# Patient Record
Sex: Female | Born: 1945
Health system: Southern US, Community
[De-identification: ages and names within clinical notes are randomized; demographics above are authoritative.]

## PROBLEM LIST (undated history)

## (undated) DIAGNOSIS — I495 Sick sinus syndrome: Secondary | ICD-10-CM

## (undated) DIAGNOSIS — I4891 Unspecified atrial fibrillation: Secondary | ICD-10-CM

## (undated) DIAGNOSIS — H9190 Unspecified hearing loss, unspecified ear: Secondary | ICD-10-CM

## (undated) DIAGNOSIS — Z950 Presence of cardiac pacemaker: Secondary | ICD-10-CM

## (undated) DIAGNOSIS — A809 Acute poliomyelitis, unspecified: Secondary | ICD-10-CM

## (undated) DIAGNOSIS — I10 Essential (primary) hypertension: Secondary | ICD-10-CM

## (undated) DIAGNOSIS — E119 Type 2 diabetes mellitus without complications: Secondary | ICD-10-CM

## (undated) DIAGNOSIS — R112 Nausea with vomiting, unspecified: Secondary | ICD-10-CM

## (undated) DIAGNOSIS — E0590 Thyrotoxicosis, unspecified without thyrotoxic crisis or storm: Secondary | ICD-10-CM

## (undated) DIAGNOSIS — M1990 Unspecified osteoarthritis, unspecified site: Secondary | ICD-10-CM

## (undated) DIAGNOSIS — G4730 Sleep apnea, unspecified: Secondary | ICD-10-CM

## (undated) DIAGNOSIS — I2510 Atherosclerotic heart disease of native coronary artery without angina pectoris: Secondary | ICD-10-CM

## (undated) DIAGNOSIS — J189 Pneumonia, unspecified organism: Secondary | ICD-10-CM

## (undated) DIAGNOSIS — R0600 Dyspnea, unspecified: Secondary | ICD-10-CM

## (undated) DIAGNOSIS — Z9889 Other specified postprocedural states: Secondary | ICD-10-CM

## (undated) DIAGNOSIS — I480 Paroxysmal atrial fibrillation: Secondary | ICD-10-CM

## (undated) DIAGNOSIS — J984 Other disorders of lung: Secondary | ICD-10-CM

## (undated) DIAGNOSIS — I5033 Acute on chronic diastolic (congestive) heart failure: Secondary | ICD-10-CM

## (undated) DIAGNOSIS — I499 Cardiac arrhythmia, unspecified: Secondary | ICD-10-CM

## (undated) HISTORY — DX: Type 2 diabetes mellitus without complications: E11.9

## (undated) HISTORY — PX: LAPAROSCOPIC HYSTERECTOMY: SHX1926

## (undated) HISTORY — DX: Acute poliomyelitis, unspecified: A80.9

## (undated) HISTORY — DX: Unspecified atrial fibrillation: I48.91

## (undated) HISTORY — DX: Unspecified osteoarthritis, unspecified site: M19.90

## (undated) HISTORY — DX: Other disorders of lung: J98.4

## (undated) HISTORY — DX: Unspecified atrial fibrillation: Z95.0

## (undated) HISTORY — DX: Sick sinus syndrome: I49.5

## (undated) HISTORY — PX: EYE SURGERY: SHX253

## (undated) HISTORY — DX: Paroxysmal atrial fibrillation: I48.0

## (undated) HISTORY — DX: Acute on chronic diastolic (congestive) heart failure: I50.33

## (undated) HISTORY — PX: ATRIAL FIBRILLATION ABLATION: SHX5732

## (undated) HISTORY — DX: Essential (primary) hypertension: I10

## (undated) HISTORY — DX: Atherosclerotic heart disease of native coronary artery without angina pectoris: I25.10

---

## 1979-06-04 HISTORY — PX: TOTAL ABDOMINAL HYSTERECTOMY: SHX209

## 1992-06-03 HISTORY — PX: CHOLECYSTECTOMY: SHX55

## 1994-06-03 HISTORY — PX: OTHER SURGICAL HISTORY: SHX169

## 2000-04-08 ENCOUNTER — Encounter: Payer: Self-pay | Admitting: Internal Medicine

## 2000-04-08 ENCOUNTER — Encounter: Admission: RE | Admit: 2000-04-08 | Discharge: 2000-04-08 | Payer: Self-pay | Admitting: Internal Medicine

## 2001-03-19 ENCOUNTER — Ambulatory Visit (HOSPITAL_COMMUNITY): Admission: RE | Admit: 2001-03-19 | Discharge: 2001-03-19 | Payer: Self-pay | Admitting: *Deleted

## 2001-03-20 ENCOUNTER — Encounter: Payer: Self-pay | Admitting: *Deleted

## 2001-03-20 ENCOUNTER — Ambulatory Visit (HOSPITAL_COMMUNITY): Admission: RE | Admit: 2001-03-20 | Discharge: 2001-03-20 | Payer: Self-pay | Admitting: *Deleted

## 2004-04-24 ENCOUNTER — Other Ambulatory Visit: Admission: RE | Admit: 2004-04-24 | Discharge: 2004-04-24 | Payer: Self-pay | Admitting: Gynecology

## 2006-03-05 ENCOUNTER — Encounter: Admission: RE | Admit: 2006-03-05 | Discharge: 2006-03-05 | Payer: Self-pay | Admitting: Internal Medicine

## 2006-07-24 ENCOUNTER — Emergency Department (HOSPITAL_COMMUNITY): Admission: EM | Admit: 2006-07-24 | Discharge: 2006-07-25 | Payer: Self-pay | Admitting: Emergency Medicine

## 2006-07-25 ENCOUNTER — Ambulatory Visit (HOSPITAL_COMMUNITY): Admission: RE | Admit: 2006-07-25 | Discharge: 2006-07-25 | Payer: Self-pay | Admitting: *Deleted

## 2006-07-25 ENCOUNTER — Encounter (INDEPENDENT_AMBULATORY_CARE_PROVIDER_SITE_OTHER): Payer: Self-pay | Admitting: Specialist

## 2007-09-30 ENCOUNTER — Encounter: Admission: RE | Admit: 2007-09-30 | Discharge: 2007-09-30 | Payer: Self-pay | Admitting: Internal Medicine

## 2008-02-18 ENCOUNTER — Encounter: Admission: RE | Admit: 2008-02-18 | Discharge: 2008-02-18 | Payer: Self-pay | Admitting: Internal Medicine

## 2008-06-15 ENCOUNTER — Ambulatory Visit (HOSPITAL_COMMUNITY): Admission: RE | Admit: 2008-06-15 | Discharge: 2008-06-15 | Payer: Self-pay | Admitting: Endocrinology

## 2008-06-15 ENCOUNTER — Encounter (HOSPITAL_COMMUNITY): Admission: RE | Admit: 2008-06-15 | Discharge: 2008-06-16 | Payer: Self-pay | Admitting: Endocrinology

## 2008-06-20 ENCOUNTER — Encounter: Admission: RE | Admit: 2008-06-20 | Discharge: 2008-06-20 | Payer: Self-pay | Admitting: Internal Medicine

## 2009-03-21 ENCOUNTER — Ambulatory Visit (HOSPITAL_COMMUNITY): Admission: RE | Admit: 2009-03-21 | Discharge: 2009-03-21 | Payer: Self-pay | Admitting: Gynecology

## 2009-03-22 ENCOUNTER — Encounter: Admission: RE | Admit: 2009-03-22 | Discharge: 2009-03-22 | Payer: Self-pay | Admitting: Internal Medicine

## 2009-08-03 ENCOUNTER — Encounter: Admission: RE | Admit: 2009-08-03 | Discharge: 2009-08-03 | Payer: Self-pay | Admitting: Internal Medicine

## 2010-10-19 NOTE — Procedures (Signed)
[facility]  Patient:    Hailey [name], COTO Visit Number: [id number] MRN: [id number]          Service Type: END Location: ENDO Attending Physician:  Sabino Gasser Dictated by:   Sabino Gasser, M.D. Proc. Date: 03/19/01 Admit Date:  03/19/2001                             Procedure Report  PROCEDURE:  Colonoscopy.  INDICATIONS:  Colon cancer screening.  ANESTHESIA:  Fentanyl 112.5 mg, Versed 12 mg.  DESCRIPTION OF PROCEDURE:  With the patient mildly sedated in the left lateral decubitus position, the Olympus videoscopic colonoscope was inserted into the rectum and passed under direct vision to the ascending colon at which point we could not pass it any further despite abdominal pressure and changing the patients position.  Prep was slightly suboptimal in that she had only taken part of her prep but visualization was adequate.  No gross lesions were seen as we withdrew the colonoscope taking circumferential views through remaining colonic mucosa stopping in the rectum which appeared normal on direct view and showed some small hemorrhoids on retroflexed view.  The rectoscope was straightened and withdrawn.  The patients vital signs and pulse oximeter remained stable.  The patient tolerated the procedure well without complications.  FINDINGS:  Grossly normal examination.  PLAN:  Barium enema to complete examination. Dictated by:   Sabino Gasser, M.D. Attending Physician:  Sabino Gasser DD:  03/19/01 TD:  03/19/01 Job: [id number]

## 2010-10-19 NOTE — Op Note (Signed)
[name], Hailey [name]                ACCOUNT NO.:  [id number]   MEDICAL RECORD NO.:  [id number]          PATIENT TYPE:  AMB   LOCATION:  ENDO                         FACILITY:  [facility]   PHYSICIAN:  Georgiana Spinner, M.D.    DATE OF BIRTH:  September 25, 1945   DATE OF PROCEDURE:  07/25/2006  DATE OF DISCHARGE:                               OPERATIVE REPORT   PROCEDURE:  Colonoscopy with biopsy and polypectomy.   ENDOSCOPIST:  Georgiana Spinner, M.D.   INDICATIONS:  Colon polyps.   The patient had an abnormal virtual colonoscopy which showed a polyp at  the hepatic flexure and one at approximately 19-20 cm from anal verge;  possibly other smaller ones were seen, but not certain.   ANESTHESIA:  Fentanyl 125 mcg, Versed 12 mg.   PROCEDURE:  With the patient mildly sedated in the left lateral  decubitus position, the Pentax videoscopic colonoscope was inserted into  the rectum and passed under direct vision to the cecum, identified by  ileocecal valve and base of cecum, both of which were photographed.  From this point, the colonoscope was slowly withdrawn, taking  circumferential views of the colonic mucosa, stopping in the hepatic  flexure area, where the polyp that was previously described was noted.  It appeared to me that this was a lipoma rather than adenomatous tissue  and this was photographed and biopsy of the polypoid head was taken to  verify this observation.  From this point, the colonoscope was then  further withdrawn, taking circumferential views of the remaining colonic  mucosa, stopping then at approximately 19-20 cm from anal verge, where  the 2nd polyp was seen; this was photographed and appeared it may very  well be adenomatous and this was removed using snare cautery technique.  However, it appeared that we did not get an adequate cautery for this  and the polyp was removed and suctioned into the endoscope for retrieval  through a trap.  I then used a hot biopsy forceps to  cauterize this base  with good cautery effect.  The endoscope was then withdrawn all the way  to the rectum, which appeared normal on direct and showed hemorrhoids on  retroflexed view.  The endoscope was straightened and withdrawn.  The  patient's vital signs and pulse oximetry remained stable.  The patient  tolerated the procedure well without apparent complications.   FINDINGS:  1. Lipoma at hepatic flexure, photographed and biopsied.  2. Polyp at 20 cm from anal verge, removed.  3. Internal hemorrhoids were also noted.   PLAN:  Await biopsy report.  The patient will call me for results and  follow up with me as an outpatient.           ______________________________  Georgiana Spinner, M.D.    GMO/MEDQ  D:  07/25/2006  T:  07/25/2006  Job:  [id number]

## 2010-10-19 NOTE — Op Note (Signed)
NAMEMARCHEL, FOOTE NO.:  [id number]   MEDICAL RECORD NO.:  [id number]          PATIENT TYPE:  EMS   LOCATION:  [facility]                         FACILITY:  [facility]   PHYSICIAN:  Georgiana Spinner, M.D.    DATE OF BIRTH:  06/28/1945   DATE OF PROCEDURE:  DATE OF DISCHARGE:  07/25/2006                               OPERATIVE REPORT   No dictation on this patient.           ______________________________  Georgiana Spinner, M.D.     GMO/MEDQ  D:  07/25/2006  T:  07/26/2006  Job:  [id number]

## 2010-11-15 ENCOUNTER — Other Ambulatory Visit: Payer: Self-pay | Admitting: Gynecology

## 2010-11-23 ENCOUNTER — Emergency Department (HOSPITAL_COMMUNITY): Payer: Medicare Other

## 2010-11-23 ENCOUNTER — Inpatient Hospital Stay (HOSPITAL_COMMUNITY)
Admission: EM | Admit: 2010-11-23 | Discharge: 2010-12-04 | DRG: 981 | Disposition: A | Discharge status: Home Health | Payer: Medicare Other | Attending: Internal Medicine | Admitting: Internal Medicine

## 2010-11-23 DIAGNOSIS — I4891 Unspecified atrial fibrillation: Secondary | ICD-10-CM | POA: Diagnosis present

## 2010-11-23 DIAGNOSIS — R918 Other nonspecific abnormal finding of lung field: Secondary | ICD-10-CM | POA: Diagnosis present

## 2010-11-23 DIAGNOSIS — B370 Candidal stomatitis: Secondary | ICD-10-CM | POA: Diagnosis not present

## 2010-11-23 DIAGNOSIS — E7800 Pure hypercholesterolemia, unspecified: Secondary | ICD-10-CM | POA: Diagnosis present

## 2010-11-23 DIAGNOSIS — F411 Generalized anxiety disorder: Secondary | ICD-10-CM | POA: Diagnosis not present

## 2010-11-23 DIAGNOSIS — I495 Sick sinus syndrome: Secondary | ICD-10-CM | POA: Diagnosis present

## 2010-11-23 DIAGNOSIS — Z7982 Long term (current) use of aspirin: Secondary | ICD-10-CM

## 2010-11-23 DIAGNOSIS — E0500 Thyrotoxicosis with diffuse goiter without thyrotoxic crisis or storm: Secondary | ICD-10-CM | POA: Diagnosis present

## 2010-11-23 DIAGNOSIS — R339 Retention of urine, unspecified: Secondary | ICD-10-CM | POA: Diagnosis not present

## 2010-11-23 DIAGNOSIS — J189 Pneumonia, unspecified organism: Principal | ICD-10-CM | POA: Diagnosis present

## 2010-11-23 DIAGNOSIS — J9600 Acute respiratory failure, unspecified whether with hypoxia or hypercapnia: Secondary | ICD-10-CM | POA: Diagnosis not present

## 2010-11-23 DIAGNOSIS — R7309 Other abnormal glucose: Secondary | ICD-10-CM | POA: Diagnosis not present

## 2010-11-23 DIAGNOSIS — E860 Dehydration: Secondary | ICD-10-CM | POA: Diagnosis present

## 2010-11-23 DIAGNOSIS — E876 Hypokalemia: Secondary | ICD-10-CM | POA: Diagnosis present

## 2010-11-23 DIAGNOSIS — G4733 Obstructive sleep apnea (adult) (pediatric): Secondary | ICD-10-CM | POA: Diagnosis present

## 2010-11-23 DIAGNOSIS — R209 Unspecified disturbances of skin sensation: Secondary | ICD-10-CM | POA: Diagnosis present

## 2010-11-23 DIAGNOSIS — I2510 Atherosclerotic heart disease of native coronary artery without angina pectoris: Secondary | ICD-10-CM | POA: Diagnosis present

## 2010-11-23 DIAGNOSIS — I10 Essential (primary) hypertension: Secondary | ICD-10-CM | POA: Diagnosis present

## 2010-11-23 LAB — DIFFERENTIAL
Basophils Absolute: 0 K/uL (ref 0.0–0.1)
Basophils Relative: 0 % (ref 0–1)
Eosinophils Absolute: 0 10*3/uL (ref 0.0–0.7)
Eosinophils Relative: 0 % (ref 0–5)
Lymphocytes Relative: 11 % — ABNORMAL LOW (ref 12–46)
Lymphs Abs: 1 K/uL (ref 0.7–4.0)
Monocytes Absolute: 0.5 K/uL (ref 0.1–1.0)
Monocytes Relative: 5 % (ref 3–12)
Neutro Abs: 7.6 K/uL (ref 1.7–7.7)
Neutrophils Relative %: 83 % — ABNORMAL HIGH (ref 43–77)

## 2010-11-23 LAB — PROTIME-INR
INR: 2.13 — ABNORMAL HIGH (ref 0.00–1.49)
Prothrombin Time: 24.2 seconds — ABNORMAL HIGH (ref 11.6–15.2)

## 2010-11-23 LAB — COMPREHENSIVE METABOLIC PANEL
ALT: 19 U/L (ref 0–35)
AST: 17 U/L (ref 0–37)
Calcium: 8.8 mg/dL (ref 8.4–10.5)
GFR calc Af Amer: >60 mL/min (ref >60)
Glucose, Bld: 133 mg/dL — ABNORMAL HIGH (ref 70–99)
Sodium: 134 mEq/L — ABNORMAL LOW (ref 135–145)
Total Protein: 7.4 g/dL (ref 6.0–8.3)

## 2010-11-23 LAB — COMPREHENSIVE METABOLIC PANEL WITH GFR
Albumin: 3.5 g/dL (ref 3.5–5.2)
Alkaline Phosphatase: 69 U/L (ref 39–117)
BUN: 8 mg/dL (ref 6–23)
CO2: 29 meq/L (ref 19–32)
Chloride: 97 meq/L (ref 96–112)
Creatinine, Ser: 0.53 mg/dL (ref 0.50–1.10)
GFR calc non Af Amer: >60 mL/min (ref >60)
Potassium: 2.9 meq/L — ABNORMAL LOW (ref 3.5–5.1)
Total Bilirubin: 1.7 mg/dL — ABNORMAL HIGH (ref 0.3–1.2)

## 2010-11-23 LAB — APTT: aPTT: 54 seconds — ABNORMAL HIGH (ref 24–37)

## 2010-11-23 LAB — CBC
HCT: 40.6 % (ref 36.0–46.0)
Hemoglobin: 13.9 g/dL (ref 12.0–15.0)
MCH: 29.9 pg (ref 26.0–34.0)
MCHC: 34.2 g/dL (ref 30.0–36.0)
MCV: 87.3 fL (ref 78.0–100.0)
Platelets: 210 10*3/uL (ref 150–400)
RBC: 4.65 MIL/uL (ref 3.87–5.11)
RDW: 13 % (ref 11.5–15.5)
WBC: 9.1 10*3/uL (ref 4.0–10.5)

## 2010-11-23 LAB — TROPONIN I: Troponin I: <0.3 ng/mL (ref <0.30)

## 2010-11-23 LAB — LIPASE, BLOOD: Lipase: 15 U/L (ref 11–59)

## 2010-11-23 LAB — CK TOTAL AND CKMB (NOT AT ARMC)
CK, MB: 1.4 ng/mL (ref 0.3–4.0)
Relative Index: INVALID (ref 0.0–2.5)
Total CK: 81 U/L (ref 7–177)

## 2010-11-24 ENCOUNTER — Emergency Department (HOSPITAL_COMMUNITY): Payer: Medicare Other

## 2010-11-24 DIAGNOSIS — R911 Solitary pulmonary nodule: Secondary | ICD-10-CM

## 2010-11-24 DIAGNOSIS — J189 Pneumonia, unspecified organism: Secondary | ICD-10-CM

## 2010-11-24 LAB — COMPREHENSIVE METABOLIC PANEL
Alkaline Phosphatase: 70 U/L (ref 39–117)
BUN: 9 mg/dL (ref 6–23)
CO2: 28 mEq/L (ref 19–32)
GFR calc Af Amer: >60 mL/min (ref >60)
GFR calc non Af Amer: >60 mL/min (ref >60)
Glucose, Bld: 172 mg/dL — ABNORMAL HIGH (ref 70–99)
Potassium: 3.8 mEq/L (ref 3.5–5.1)
Total Bilirubin: 0.9 mg/dL (ref 0.3–1.2)
Total Protein: 6.2 g/dL (ref 6.0–8.3)

## 2010-11-24 LAB — PROTIME-INR: Prothrombin Time: 24.4 seconds — ABNORMAL HIGH (ref 11.6–15.2)

## 2010-11-24 LAB — LIPID PANEL: LDL Cholesterol: 31 mg/dL (ref 0–99)

## 2010-11-24 LAB — CARDIAC PANEL(CRET KIN+CKTOT+MB+TROPI)
Relative Index: INVALID (ref 0.0–2.5)
Relative Index: 2.5 (ref 0.0–2.5)
Total CK: 82 U/L (ref 7–177)
Total CK: 111 U/L (ref 7–177)
Troponin I: <0.3 ng/mL (ref <0.30)

## 2010-11-24 LAB — HEMOGLOBIN A1C: Hgb A1c MFr Bld: 6.4 % — ABNORMAL HIGH (ref <5.7)

## 2010-11-24 LAB — PRO B NATRIURETIC PEPTIDE: Pro B Natriuretic peptide (BNP): 939 pg/mL — ABNORMAL HIGH (ref 0–125)

## 2010-11-24 LAB — MAGNESIUM: Magnesium: 2 mg/dL (ref 1.5–2.5)

## 2010-11-24 MED ORDER — IOHEXOL 300 MG/ML  SOLN: 65.0000 mL | Freq: Once | INTRAMUSCULAR | Status: AC | PRN

## 2010-11-25 ENCOUNTER — Inpatient Hospital Stay (HOSPITAL_COMMUNITY): Payer: Medicare Other

## 2010-11-25 LAB — BLOOD GAS, ARTERIAL
Drawn by: 23588
FIO2: 0.21 %
O2 Saturation: 94.9 %
Patient temperature: 98.6
pO2, Arterial: 67.4 mmHg — ABNORMAL LOW (ref 80.0–100.0)

## 2010-11-25 LAB — CBC
MCH: 29.7 pg (ref 26.0–34.0)
Platelets: 179 10*3/uL (ref 150–400)
RBC: 3.91 MIL/uL (ref 3.87–5.11)
RDW: 13.5 % (ref 11.5–15.5)
WBC: 8.4 10*3/uL (ref 4.0–10.5)

## 2010-11-25 LAB — BASIC METABOLIC PANEL
CO2: 24 mEq/L (ref 19–32)
Calcium: 7.9 mg/dL — ABNORMAL LOW (ref 8.4–10.5)
Sodium: 140 mEq/L (ref 135–145)

## 2010-11-25 NOTE — H&P (Signed)
NAMEMERCIA, DOWE NO.:  [id number]  MEDICAL RECORD NO.:  [id number]  LOCATION:  [facility]                         FACILITY:  [facility]  PHYSICIAN:  Michiel Cowboy, MDDATE OF BIRTH:  10-Jul-1945  DATE OF ADMISSION:  11/23/2010 DATE OF DISCHARGE:                             HISTORY & PHYSICAL   PRIMARY CARE PROVIDER:  Massie Maroon, MD  CHIEF COMPLAINT:  Chills and chest pain.  HISTORY:  The patient is a 65 year old female with past medical history significant for goiter, atrial fibrillation and hypertension.  For the past 2-3 days, she had been having worsening chills and cough, not really productive but today around 1 o'clock, she developed pleuritic chest pain, it is worse with coughing and worse with deep inspiration. She has not had any leg swelling, but she did take a trip to the beach this past few days.  She has had some shortness of breath associated with this.  No weight loss.  Her temperature at home was high as 100.9. She does get occasional diarrhea,but has not had any nausea, vomiting, or diarrhea recently.  Otherwise, review of systems negative, 10 systems reviewed.  The patient has no tuberculosis risk factors that she is aware of.  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  She denied any hemoptysis, weight loss, or persistent cough.  Her symptoms are very sudden in onset.  PAST MEDICAL HISTORY:  Significant for: 1. Atrial fibrillation, chronic Coumadin. 2. Hypertension. 3. Hypercholesterolemia. 4. Hyperactive thyroid due to goiter. 5. The patient does have a history of pulmonary nodules which were     followed by Dr. Selena Batten.  She never seen a pulmonologist that I can see     a record of and Dr. Selena Batten was feeling that her pulmonary nodules have     been stable for 2 past years. Today CT scan     showed there was some increase in size of her pulmonary nodules.  SOCIAL HISTORY:  The patient lives with spouse.  Does not smoke or drink or abuse  drugs, never did.  FAMILY HISTORY:  Noncontributory.  ALLERGIES:  To CODEINE, DEMEROL, and TORADOL.  MEDICATIONS: 1. Caltrate 600 plus vitamin D. 2. Coumadin, she takes 5 mg on Sunday, Monday, Wednesday and Friday     and 2.5 mg on the other days. 3. Diovan 80 mg daily. 4. Hydrochlorothiazide 12.5 mg daily. 5. She occasionally takes methocarbamol, but very rarely. 6. Metoprolol 25 mg b.i.d. 7. Pantoprazole 40 mg daily. 8. Propylthiouracil 50 mg b.i.d. 9. Protonix 40 mg daily. 10.Vitamin D3 1000 units daily. 11.WelChol 625 mg 8 tablets daily.  PHYSICAL EXAMINATION:  VITAL SIGNS:  Temperature 98.5, blood pressure 144/65, now down to 125/74, pulse fluctuating between high 90s to 120s, respirations 18, satting 98% on 2 L and was 90-93% on room air. GENERAL:  The patient appears to be in no acute distress. HEAD:  Nontraumatic.  Slightly dry mucous membranes. LUNGS:  There is occasional crackles at the bases bilaterally. HEART:  Irregularly irregular.  No murmurs appreciated. ABDOMEN:  Soft, nontender and nondistended. LOWER EXTREMITIES:  Without clubbing, cyanosis or edema. NEUROLOGICAL:  Grossly intact.  LABORATORY DATA:  White  blood cell count 9.1, hemoglobin 13.9.  Sodium was 134, potassium 2.9, creatinine 0.53.  Total bili 1.7 albumin 3.5. EKG showing no significant changes.  She had old nonspecific ST changes. Chest x-ray showing some infiltrates.  CT scan showed bilateral questionable pneumonia.  There is increase in pulmonary nodules, question reactivation of granulomatous disease.  ASSESSMENT AND PLAN: 1. Bilateral pneumonia.  We will admit, put on Rocephin and     azithromycin for now.  Give IV fluids, Mucinex and oxygen. 2. Pulmonary nodules increasing.  Pulmonary is aware and will consult. 3. History of hypertension.  Her blood pressure actually had been a     little bit low couple times while she was here in the emergency     department.  We will hold all meds  except for metoprolol for now.     Her blood pressure meds could be restarted tomorrow. 4. Hypokalemia.  Replace and check magnesium level in the morning. 5. Atrial fibrillation with rapid ventricular response.  The patient     appears to be somewhat dehydrated with IV fluids, so atrial     fibrillation has improved.  Her heart rate when she is resting     comes down to 90s.  We will continue right now on her home dose     metoprolol and watch her heart rate if needed and give additional     IV fluids versus she may need to be on a low-dose drip. 6. Prophylaxis.  Protonix and Coumadin. 7. History of hyperthyroidism.  We will check TSH.     Michiel Cowboy, MD     AVD/MEDQ  D:  11/24/2010  T:  11/24/2010  Job:  [id number]  cc:   Massie Maroon, MD  Electronically Signed by Therisa Doyne MD on 11/25/2010 09:42:03 PM

## 2010-11-26 LAB — PROTIME-INR: Prothrombin Time: 25.3 seconds — ABNORMAL HIGH (ref 11.6–15.2)

## 2010-11-26 LAB — BASIC METABOLIC PANEL
BUN: 5 mg/dL — ABNORMAL LOW (ref 6–23)
Creatinine, Ser: 0.52 mg/dL (ref 0.50–1.10)
GFR calc Af Amer: >60 mL/min (ref >60)
GFR calc non Af Amer: >60 mL/min (ref >60)
Glucose, Bld: 142 mg/dL — ABNORMAL HIGH (ref 70–99)

## 2010-11-26 LAB — CBC
MCH: 29.4 pg (ref 26.0–34.0)
MCHC: 33.3 g/dL (ref 30.0–36.0)
MCV: 88.3 fL (ref 78.0–100.0)
Platelets: 224 10*3/uL (ref 150–400)
RBC: 4.28 MIL/uL (ref 3.87–5.11)
RDW: 13 % (ref 11.5–15.5)

## 2010-11-27 LAB — PROTIME-INR
INR: 2.84 — ABNORMAL HIGH (ref 0.00–1.49)
Prothrombin Time: 30.3 seconds — ABNORMAL HIGH (ref 11.6–15.2)

## 2010-11-27 LAB — CBC
HCT: 33.9 % — ABNORMAL LOW (ref 36.0–46.0)
Hemoglobin: 11.3 g/dL — ABNORMAL LOW (ref 12.0–15.0)
MCH: 29.3 pg (ref 26.0–34.0)
MCHC: 33.3 g/dL (ref 30.0–36.0)
MCV: 87.8 fL (ref 78.0–100.0)
Platelets: 222 10*3/uL (ref 150–400)
RBC: 3.86 MIL/uL — ABNORMAL LOW (ref 3.87–5.11)
RDW: 13 % (ref 11.5–15.5)
WBC: 6.2 10*3/uL (ref 4.0–10.5)

## 2010-11-27 LAB — COMPREHENSIVE METABOLIC PANEL
Albumin: 2.7 g/dL — ABNORMAL LOW (ref 3.5–5.2)
Alkaline Phosphatase: 58 U/L (ref 39–117)
BUN: 5 mg/dL — ABNORMAL LOW (ref 6–23)
Chloride: 104 mEq/L (ref 96–112)
Creatinine, Ser: 0.47 mg/dL — ABNORMAL LOW (ref 0.50–1.10)
GFR calc Af Amer: >60 mL/min (ref >60)
Glucose, Bld: 135 mg/dL — ABNORMAL HIGH (ref 70–99)
Potassium: 3.1 mEq/L — ABNORMAL LOW (ref 3.5–5.1)
Total Bilirubin: 0.9 mg/dL (ref 0.3–1.2)
Total Protein: 5.9 g/dL — ABNORMAL LOW (ref 6.0–8.3)

## 2010-11-27 LAB — LEGIONELLA ANTIGEN, URINE: Legionella Antigen, Urine: NEGATIVE

## 2010-11-28 LAB — TSH: TSH: 0.764 u[IU]/mL (ref 0.350–4.500)

## 2010-11-28 LAB — BASIC METABOLIC PANEL
BUN: 4 mg/dL — ABNORMAL LOW (ref 6–23)
Calcium: 8.3 mg/dL — ABNORMAL LOW (ref 8.4–10.5)
Creatinine, Ser: <0.47 mg/dL — ABNORMAL LOW (ref 0.50–1.10)
Glucose, Bld: 116 mg/dL — ABNORMAL HIGH (ref 70–99)

## 2010-11-28 LAB — CBC
HCT: 34.4 % — ABNORMAL LOW (ref 36.0–46.0)
Hemoglobin: 11.4 g/dL — ABNORMAL LOW (ref 12.0–15.0)
MCH: 29.2 pg (ref 26.0–34.0)
MCHC: 33.1 g/dL (ref 30.0–36.0)
MCV: 88.2 fL (ref 78.0–100.0)
RDW: 13.1 % (ref 11.5–15.5)

## 2010-11-28 LAB — PROTIME-INR: INR: 3.15 — ABNORMAL HIGH (ref 0.00–1.49)

## 2010-11-29 ENCOUNTER — Inpatient Hospital Stay (HOSPITAL_COMMUNITY): Payer: Medicare Other

## 2010-11-29 LAB — PROTIME-INR: INR: 3.1 — ABNORMAL HIGH (ref 0.00–1.49)

## 2010-11-29 LAB — BASIC METABOLIC PANEL
CO2: 29 mEq/L (ref 19–32)
Calcium: 7.3 mg/dL — ABNORMAL LOW (ref 8.4–10.5)
Glucose, Bld: 111 mg/dL — ABNORMAL HIGH (ref 70–99)
Sodium: 140 mEq/L (ref 135–145)

## 2010-11-29 LAB — CBC
HCT: 32.4 % — ABNORMAL LOW (ref 36.0–46.0)
Hemoglobin: 10.7 g/dL — ABNORMAL LOW (ref 12.0–15.0)
MCH: 29.2 pg (ref 26.0–34.0)
RBC: 3.67 MIL/uL — ABNORMAL LOW (ref 3.87–5.11)

## 2010-11-30 LAB — CBC
HCT: 35.8 % — ABNORMAL LOW (ref 36.0–46.0)
Hemoglobin: 12 g/dL (ref 12.0–15.0)
MCHC: 33.5 g/dL (ref 30.0–36.0)
WBC: 6.2 10*3/uL (ref 4.0–10.5)

## 2010-11-30 LAB — PROTIME-INR: INR: 2.67 — ABNORMAL HIGH (ref 0.00–1.49)

## 2010-12-01 LAB — CBC
HCT: 34.4 % — ABNORMAL LOW (ref 36.0–46.0)
Hemoglobin: 11.3 g/dL — ABNORMAL LOW (ref 12.0–15.0)
WBC: 6 10*3/uL (ref 4.0–10.5)

## 2010-12-01 LAB — HEPARIN LEVEL (UNFRACTIONATED): Heparin Unfractionated: <0.1 IU/mL — ABNORMAL LOW (ref 0.30–0.70)

## 2010-12-01 LAB — PROTIME-INR: INR: 1.86 — ABNORMAL HIGH (ref 0.00–1.49)

## 2010-12-02 DIAGNOSIS — R609 Edema, unspecified: Secondary | ICD-10-CM

## 2010-12-02 LAB — URINE MICROSCOPIC-ADD ON

## 2010-12-02 LAB — CULTURE, BLOOD (ROUTINE X 2)
Culture  Setup Time: 201206250014
Culture: NO GROWTH

## 2010-12-02 LAB — CBC
Hemoglobin: 11.7 g/dL — ABNORMAL LOW (ref 12.0–15.0)
RBC: 3.91 MIL/uL (ref 3.87–5.11)
WBC: 6.8 10*3/uL (ref 4.0–10.5)

## 2010-12-02 LAB — URINALYSIS, ROUTINE W REFLEX MICROSCOPIC
Ketones, ur: NEGATIVE mg/dL
Nitrite: NEGATIVE
Protein, ur: NEGATIVE mg/dL

## 2010-12-02 LAB — HEPARIN LEVEL (UNFRACTIONATED)
Heparin Unfractionated: 0.25 IU/mL — ABNORMAL LOW (ref 0.30–0.70)
Heparin Unfractionated: 0.43 IU/mL (ref 0.30–0.70)

## 2010-12-02 LAB — PROTIME-INR: Prothrombin Time: 15.2 seconds (ref 11.6–15.2)

## 2010-12-03 HISTORY — PX: PACEMAKER INSERTION: SHX728

## 2010-12-03 HISTORY — PX: CARDIAC CATHETERIZATION: SHX172

## 2010-12-03 LAB — DIFFERENTIAL
Basophils Absolute: 0 10*3/uL (ref 0.0–0.1)
Basophils Relative: 0 % (ref 0–1)
Lymphocytes Relative: 26 % (ref 12–46)
Monocytes Absolute: 0.4 10*3/uL (ref 0.1–1.0)
Neutro Abs: 3.9 10*3/uL (ref 1.7–7.7)
Neutrophils Relative %: 65 % (ref 43–77)

## 2010-12-03 LAB — BASIC METABOLIC PANEL
GFR calc Af Amer: >60 mL/min (ref >60)
GFR calc non Af Amer: >60 mL/min (ref >60)
Glucose, Bld: 103 mg/dL — ABNORMAL HIGH (ref 70–99)
Potassium: 3.7 mEq/L (ref 3.5–5.1)
Sodium: 138 mEq/L (ref 135–145)

## 2010-12-03 LAB — CBC
Hemoglobin: 11.7 g/dL — ABNORMAL LOW (ref 12.0–15.0)
MCH: 29.4 pg (ref 26.0–34.0)
RBC: 3.98 MIL/uL (ref 3.87–5.11)
WBC: 6.2 10*3/uL (ref 4.0–10.5)

## 2010-12-03 LAB — HEPARIN LEVEL (UNFRACTIONATED): Heparin Unfractionated: 0.44 IU/mL (ref 0.30–0.70)

## 2010-12-03 LAB — PROTIME-INR: INR: 1.12 (ref 0.00–1.49)

## 2010-12-04 ENCOUNTER — Inpatient Hospital Stay (HOSPITAL_COMMUNITY): Payer: Medicare Other

## 2010-12-04 LAB — PROTIME-INR: Prothrombin Time: 14.2 seconds (ref 11.6–15.2)

## 2010-12-04 LAB — CBC
Hemoglobin: 11.4 g/dL — ABNORMAL LOW (ref 12.0–15.0)
MCH: 29.3 pg (ref 26.0–34.0)
RBC: 3.89 MIL/uL (ref 3.87–5.11)

## 2010-12-04 LAB — HEPARIN LEVEL (UNFRACTIONATED): Heparin Unfractionated: <0.1 IU/mL — ABNORMAL LOW (ref 0.30–0.70)

## 2010-12-08 NOTE — Cardiovascular Report (Signed)
Hailey [name], Hailey [name]                ACCOUNT NO.:  [id number]  MEDICAL RECORD NO.:  [id number]  LOCATION:  2023                         FACILITY:  [facility]  PHYSICIAN:  [name], MD     DATE OF BIRTH:  10/03/1945  DATE OF PROCEDURE: DATE OF DISCHARGE:                           CARDIAC CATHETERIZATION   PROCEDURE PERFORMED: 1. Left heart catheterization 2. Selective coronary angiography.  REASON FOR THE PROCEDURE:  Abnormal electrocardiogram in a patient with numerous coronary risk factors.  Hailey [name] is a 65 year old woman admitted for bacterial pneumonia complicated by recurrent episodes of paroxysmal atrial flutter and sinus pauses due to tachycardia-bradycardia syndrome.  She has not had any angina during this hospitalization, but electrocardiogram does show possible ischemic changes predominantly in the anterior leads.  She has normal left ventricular systolic function and there are no regional wall motion abnormalities by echocardiography.  After risks and benefits of the procedure were described, the patient provided informed consent.  She was brought to the cardiac cath lab in a fasting state and prepped and draped in the usual sterile fashion. Local anesthesia with 1% lidocaine was administered in right groin area. Using modified Seldinger technique, a 5-French right common femoral artery sheath was introduced with minimal difficulty.  Using 5-French JL- 4 and JR catheters selective cannulation of the left and right coronaries were performed and multiple coronary angiograms in a variety of projections were recorded.  The JR catheter was also used to across the aortic valve and record intracardiac pressures of the left ventriculogram was not performed.  No complications occurred in procedure.  FINDINGS: 1. The left main coronary artery is normal. 2. The left anterior descending artery is a intermediate size vessel     that generates two important diagonal  arteries.  The proximal     diagonal artery is very large and provides most of the vascular     supply to the anterolateral myocardium.  The distal LAD generates a     second diagonal artery that is comparable in size with the parent     vessel.  No significant coronary stenoses are seen in the LAD     system, although occasional luminal irregularities are noted and     the distal LAD artery is fairly attenuated. 3. The left circumflex coronary artery is a intermediate size     nondominant vessel.  It generates a very small first oblique     marginal artery and a large second oblique marginal artery that     branches to provide the most of the inferolateral wall vascular     supply.  A third OM/PLV branch is also seen.  This is small in     size. 4. The right coronary artery is a large dominant vessel.  There is a     smooth and slightly eccentric stenosis in the midportion of the     vessel that is probably 70% in maximum diameter severity.  There is     no evidence of thrombus or ulcerated plaque.  The remainder of the     right coronary artery is free of significant disease.  It generates     a very  long posterior descending artery and a fairly small     posterolateral ventricular artery.  The left ventricle end-diastolic pressure is 10 mmHg.  Left ventricular systolic function, regional wall motion is normal by echo.  CONCLUSION:  Hailey [name] has single-vessel coronary artery disease involving the mid right coronary artery.  This lesion itself appears smooth and unlikely to represents a culprit lesion for an acute coronary syndrome.  Her presentation was with pulmonary infection and the coronary artery disease findings are pretty much incidental.  Most of her chronic symptoms appear to be attributable to arrhythmia. She has longstanding history of paroxysmal atrial tachyarrhythmias and has not been found to have repetitive episodes of sinus arrest.  I think the more important  procedure to be performed is implantation of dual- chamber permanent pacemaker.  The functional significance of the coronary lesion can be established later by use of a myocardial perfusion study.  It will be preferable to wait for approximately 2 weeks until the perfusion study performed to allow her to recover from the pacemaker procedure.     [name], MD     MC/MEDQ  D:  12/03/2010  T:  12/04/2010  Job:  [id number]  cc:   [facility] Massie Maroon, MD  Electronically Signed by [name] M.D. on 12/08/2010 91:47:82 AM

## 2010-12-20 HISTORY — PX: NM MYOCAR PERF WALL MOTION: HXRAD629

## 2011-01-02 NOTE — Discharge Summary (Signed)
Hailey [name], Hailey [name]                ACCOUNT NO.:  [id number]  MEDICAL RECORD NO.:  [id number]  LOCATION:  2023                         FACILITY:  [facility]  PHYSICIAN:  Calvert Cantor, M.D.     DATE OF BIRTH:  [birth year]  DATE OF ADMISSION:  11/23/2010 DATE OF DISCHARGE:  12/04/2010                              DISCHARGE SUMMARY   PRIMARY CARE PHYSICIAN:  Dr. [name].  CONSULTANTS: 1. Dr. Jenetta Loges from Pulmonary. 2. Dr. [name] from Cardiology.  CHIEF COMPLAINT:  Chills and chest pain.  DISCHARGE DIAGNOSES: 1. Bilateral community-acquired pneumonia, has completed 10 days of     Zithromax and Zosyn. 2. Complaints of chest pain and dyspnea on exertion, status post     cardiac cath, found to have a 70-80% right coronary artery     occlusion.  No intervention done as of yet. 3. Sick sinus syndrome status post pacemaker placement. 4. Urinary retention after above-mentioned procedures, which is now     resolved. 5. Thrush, treated with nystatin, now resolved. 6. Atrial fibrillation. 7. Pulmonary nodules, needs outpatient follow up. 8. Numbness of right leg, needs outpatient follow up. 9. Hypercholesterolemia. 10.Hyperactive thyroid secondary to goiter.  DISCHARGE MEDICATIONS: 1. Aspirin 81 mg daily. 2. Plavix 75 mg daily. 3. Diltiazem 120 mg daily. 4. Guaifenesin 5 mL q.4 h as needed. 5. Metoprolol 50 mg twice a day.  Continue the following home meds: 1. Calcium carbonate 1 tablet daily. 2. Diovan 160 mg half tablet daily. 3. Hydrochlorothiazide 12.5 mg daily. 4. Methocarbamol 500 mg twice a day. 5. Propylthiouracil 50 mg twice a day. 6. Protonix 40 mg daily. 7. Vitamin D3 1000 units daily. 8. WelChol 625 mg 6-8 tablets 4 times a day depending on amount of     loose stools. 9. Dairy-Relief OTC 1-2 tablets 3 times a day as needed.  Stop the following medications:  Coumadin and metoprolol 25 mg twice a day.  PROCEDURES DURING HOSPITAL STAY: 1. Chest x-ray,  portable, November 23, 2010, revealed possible right lower     lobe pneumonia. 2. CT angio of the chest performed, November 24, 2010, revealed bilateral     lower lobe consolidation, bronchial wall thickening, hilar     lymphadenopathy, enlargement/increase in number of diffuse     pulmonary nodules. 3. Chest x-ray, portable, November 25, 2010, revealed worsening bibasilar     airspace disease. 4. CT head without contrast, November 29, 2010, revealed mild atrophy.  No     visible acute intracranial findings. 5. Chest x-ray on November 04, 2010, revealed left subclavian pacemaker,     questionable mild heart failure. 6. Cardiac cath performed December 03, 2010, conclusion:  [name] has     single vessel coronary artery disease involving the mid right     coronary artery.  The lesion itself appears smooth and unlikely to     represent a culprit lesion for any acute coronary syndrome.  Her     presentation with pulmonary infection and coronary artery disease     findings are pretty much incidental.  Most of her chronic symptoms     appear to be attributed to arrhythmia.  She has longstanding  history of paroxysmal atrial tachyarrhythmias and has not been     found to have repetitive episodes of sinus arrest.  I think the     most important procedure to be performed is implantation of dual-     chamber permanent pacemaker.  Functional significance of the     coronary lesion can be established later by use of a myocardial     perfusion study.  It is preferable to wait approximately 2 weeks     until perfusion recent steady be performed to allow her to recover     from pacemaker procedure. 7. The patient also had a dual-chamber pacemaker placed on November 03, 2010. 8. Bilateral lower extremity Dopplers; negative for DVT.  HOSPITAL COURSE:  This is a 65 year old female who presented to the ER with complaints of chills and chest pain.  She was diagnosed with community-acquired pneumonia and placed on Rocephin  and Zithromax. Unfortunately, her infiltrates worsened and so did her clinical condition and therefore antibiotics were broadened to vancomycin, Zosyn, and Zithromax.  Eventually, vancomycin was able to be discontinued and she continued a course of Zosyn and Zithromax.  She completed 10 days of IV antibiotics today and they are now being discontinued.  Respiratory wise, she is doing quite well.  She has no shortness of breath and is ambulating in the halls without significant dyspnea.  She still does have a mild dry cough.  The patient was noted to have increasing pulmonary nodules and therefore, a Pulmonary consult was requested.  Per Dr. Jenetta Loges, who evaluated the patient, an outpatient PET/CT scan is recommended in 6- 8 weeks.  The patient was subsequently found to be having cardiac pauses, some as long as 3.5 seconds.  Therefore, a Cardiology consult was requested.  It was recommended that the patient have a cardiac cath and be evaluated for pacemaker placement.  The patient underwent both cardiac cath and pacemaker placement yesterday on November 03, 2010, with the above-mentioned result.  Pacemaker site looks well as does the cath site.  As mentioned above, the RCA lesion will be evaluated again in 2 weeks with a Myoview stress test and if need be the patient will have a repeat cath with intervention.  At this point, Cardiology has recommended that her Coumadin be held and she will be started on aspirin and Plavix for now.  The patient did develop some thrush during the hospital stay, was cleared with nystatin.  The patient also developed right lower extremity numbness during this hospital stay.  I did obtain a CT of the head which was negative for acute or subacute infarct.  I also noted edema of the right lower extremity couple of days later.  At this point, Doppler of the lower extremity was performed and also found to be negative for DVT.  I recommend her numbness  continued to be followed as outpatient.  She may need MRI of her spine to further evaluate this.  The patient has been evaluated by physical therapy and is recommended to have home health PT and OT.  I have also added a home health RN.  PHYSICAL EXAMINATION:  LUNGS:  Clear bilaterally.  Good respiratory effort.  No use of accessory muscles. HEART:  Currently, regular rate and rhythm.  No murmurs, rubs, or gallops. ABDOMEN:  Soft, nontender, and nondistended.  Bowel sounds positive. EXTREMITIES:  No cyanosis, clubbing, or edema.  CONDITION ON DISCHARGE:  Stable.  FOLLOWUP INSTRUCTIONS: 1. Follow up with  Dr. Selena Batten in 1 week.  The patient needs a follow up     PET/CT of her chest and also follow up for the numbness in her     right lower extremity. 2. Follow up with [facility].  The patient needs a Myoview     stress test in 2 weeks.  Time on the patient care today was 60 minutes.     Calvert Cantor, M.D.     SR/MEDQ  D:  12/04/2010  T:  12/05/2010  Job:  [id number]  cc:   Massie Maroon, MD  Electronically Signed by Calvert Cantor M.D. on 01/02/2011 07:21:17 AM

## 2011-01-21 ENCOUNTER — Other Ambulatory Visit (HOSPITAL_COMMUNITY): Payer: Self-pay | Admitting: Internal Medicine

## 2011-01-21 DIAGNOSIS — R918 Other nonspecific abnormal finding of lung field: Secondary | ICD-10-CM

## 2011-01-29 ENCOUNTER — Encounter (HOSPITAL_COMMUNITY)
Admission: RE | Admit: 2011-01-29 | Discharge: 2011-01-29 | Disposition: A | Discharge status: Home / Self Care | Payer: Medicare Other | Source: Ambulatory Visit | Attending: Internal Medicine | Admitting: Internal Medicine

## 2011-01-29 DIAGNOSIS — I517 Cardiomegaly: Secondary | ICD-10-CM | POA: Insufficient documentation

## 2011-01-29 DIAGNOSIS — Z8701 Personal history of pneumonia (recurrent): Secondary | ICD-10-CM | POA: Insufficient documentation

## 2011-01-29 DIAGNOSIS — R911 Solitary pulmonary nodule: Secondary | ICD-10-CM | POA: Insufficient documentation

## 2011-01-29 DIAGNOSIS — R918 Other nonspecific abnormal finding of lung field: Secondary | ICD-10-CM

## 2011-01-29 DIAGNOSIS — J984 Other disorders of lung: Secondary | ICD-10-CM | POA: Insufficient documentation

## 2011-01-29 DIAGNOSIS — K7689 Other specified diseases of liver: Secondary | ICD-10-CM | POA: Insufficient documentation

## 2011-01-29 LAB — GLUCOSE, CAPILLARY: Glucose-Capillary: 123 mg/dL — ABNORMAL HIGH (ref 70–99)

## 2011-01-29 MED ORDER — FLUDEOXYGLUCOSE F - 18 (FDG) INJECTION
16.4000 | Freq: Once | INTRAVENOUS | Status: AC | PRN
  Administered 2011-01-29: 16.4 via INTRAVENOUS

## 2011-02-18 ENCOUNTER — Encounter: Payer: Self-pay | Admitting: Pulmonary Disease

## 2011-02-18 ENCOUNTER — Ambulatory Visit (INDEPENDENT_AMBULATORY_CARE_PROVIDER_SITE_OTHER): Payer: Medicare Other | Admitting: Pulmonary Disease

## 2011-02-18 DIAGNOSIS — J984 Other disorders of lung: Secondary | ICD-10-CM

## 2011-02-18 DIAGNOSIS — R599 Enlarged lymph nodes, unspecified: Secondary | ICD-10-CM

## 2011-02-18 DIAGNOSIS — R590 Localized enlarged lymph nodes: Secondary | ICD-10-CM | POA: Insufficient documentation

## 2011-02-18 DIAGNOSIS — R911 Solitary pulmonary nodule: Secondary | ICD-10-CM | POA: Insufficient documentation

## 2011-02-18 NOTE — Patient Instructions (Signed)
Will check a followup scan in one year to re- evaluate your very small nodule.  Please call us august 2013 to schedule.

## 2011-02-18 NOTE — Assessment & Plan Note (Signed)
The pt has a small nodule in LLL that has decreased in size from 11/2010 scan.  She is low risk with being a nonsmoker, and no personal history of cancer.  Would like to do one more f/u scan in one year.

## 2011-02-18 NOTE — Assessment & Plan Note (Signed)
The pt had LN on scan in June, and most likely was reactive from her lower lobe pna and also an element of heart failure.  Her PET last month showed no significant LN.

## 2011-02-18 NOTE — Progress Notes (Signed)
  Subjective:    Patient ID: Hailey [name], female    DOB: [birth year], 65 y.o.   MRN: [id number]  HPI The pt is a 64y/o female who I have been asked to see for pulmonary nodule and recent episode of pna.  She has had an eventful summer, with pna in June, and pacemaker in July with CHF noted on her cxr.  She has had a f/u CT/PET 01/2011 which showed clearing of her pna in lower lobes, resolution of her LN, and a decrease in size of her prior pulm nodule from 9 to 7mm.  The patient feels that she is getting back to her usual self, and only has minimal cough at night that she does not think is an issue.  She has no mucus production.  Patient admits that she has chronic long-standing dyspnea on exertion, and her breathing currently is better than 6 months ago.  She believes things improved after her pacemaker implantation.  The patient has no personal history of cancer, and has never smoked.  Review of Systems  Constitutional: Positive for unexpected weight change. Negative for fever.  HENT: Negative for ear pain, nosebleeds, congestion, sore throat, rhinorrhea, sneezing, trouble swallowing, dental problem, postnasal drip and sinus pressure.   Eyes: Negative for redness and itching.  Respiratory: Positive for cough and shortness of breath. Negative for chest tightness and wheezing.   Cardiovascular: Positive for palpitations. Negative for leg swelling.  Gastrointestinal: Negative for nausea and vomiting.  Genitourinary: Negative for dysuria.  Musculoskeletal: Negative for joint swelling.  Skin: Negative for rash.  Neurological: Negative for headaches.  Hematological: Does not bruise/bleed easily.  Psychiatric/Behavioral: Negative for dysphoric mood. The patient is not nervous/anxious.        Objective:   Physical Exam Constitutional:  Well developed, no acute distress  HENT:  Nares patent without discharge.  Crusting noted in left nare  Oropharynx without exudate, palate and uvula are  normal  Eyes:  Perrla, eomi, no scleral icterus  Neck:  No JVD, no TMG  Cardiovascular:  Normal rate, regular rhythm, no rubs or gallops.  No murmurs        Intact distal pulses  Pulmonary :  Normal breath sounds, no stridor or respiratory distress   No rales, rhonchi, or wheezing  Abdominal:  Soft, nondistended, bowel sounds present.  No tenderness noted.   Musculoskeletal:  Mild ankle lower extremity edema noted.  Lymph Nodes:  No cervical lymphadenopathy noted  Skin:  No cyanosis noted  Neurologic:  Alert, appropriate, moves all 4 extremities without obvious deficit.         Assessment & Plan:

## 2011-03-01 NOTE — Consult Note (Signed)
NAMEHENDEL, GATLIFF NO.:  [id number]  MEDICAL RECORD NO.:  [id number]  LOCATION:  2023                         FACILITY:  [facility]  PHYSICIAN:  Jenetta Loges, MD      DATE OF BIRTH:  July 29, 1945  DATE OF CONSULTATION: DATE OF DISCHARGE:                                CONSULTATION   REASON FOR CONSULT:  Enlarging pulmonary nodules and mediastinal lymphadenopathy.  CHIEF COMPLAINT:  Cough and chest pain.  HISTORY OF PRESENT ILLNESS:  Ms. Foresta is a very pleasant 65 year old white female with past history significant for negative for atrial fibrillation on Coumadin, hypertension, and previously stable pulmonary nodule, who presents to the ED complaining of cough and chest pain.  The patient states she began having a nonproductive cough 2-3 days ago.  She also had onset of malaise and low-grade fevers around the same time.  T- max at home was 100.9 degrees Fahrenheit.  Earlier in the day, the patient experienced chest pain and left lateral rib pain associated with the cough became.  She became concerned as it would not relieve with Tylenol, thus she came to the ED.  The patient does state she is exposed to an illness to her granddaughter last week while visiting [place]. She endorses nausea and wheezing, but denies sputum production, hemoptysis, vomiting, or chills.  She has no history of underlying lung disease.  PAST MEDICAL HISTORY: 1. Atrial fibrillation on Coumadin. 2. Hypertension. 3. Borderline diabetes. 4. Stable pulmonary nodules.  MEDICATIONS:  Caltrate, Coumadin, Diovan, hydrochlorothiazide, metoprolol, methocarbamol, aspirin, pantoprazole, Levothroid, uracil, and WelChol.  ALLERGIES:  DEMEROL, CODEINE, and TORADOL.  FAMILY HISTORY:  Significant for coronary artery disease in the patient's sister.  She also has 2 other sisters with breast cancer, one niece with colon cancer, and grandchildren with asthma.  SOCIAL HISTORY:  The patient  lives at home with her husband.  She is a retired Film/video editor.  Denies tobacco, alcohol, or drugs.  No indoor pets.  REVIEW OF SYSTEMS:  All systems were reviewed and were negative except as above in HPI.  PHYSICAL EXAMINATION:  VITAL SIGNS:  Temperature 98.5, blood pressure 106/62, pulse 95, respirations 18, O2 sat 93% on room air. GENERAL:  Obese white female in no acute distress. HEENT:  Normocephalic, atraumatic.  Pupils equal, round, and reactive to light.  Moist mucous membranes.  Oropharynx clear. NECK:  Supple.  No cervical lymphadenopathy. CARDIOVASCULAR:  Irregularly irregular. RESPIRATORY:  Bibasilar crackles, wheeze with cough. ABDOMEN:  Normoactive bowel sounds, soft, nontender, nondistended. EXTREMITIES:  No clubbing, cyanosis, or edema. NEUROLOGIC:  Grossly intact with no focal deficits.  RELEVANT LABORATORY DATA:  BNP 939.  Troponin less than 0.3, potassium 2.9, INR 2.13, total bili 1.7.  Remainder of electrolyte panel and CBC within normal limits.  CT of the chest showed a left lower lobe nodule increased in size to 0.9 cm from previous of 0.7 cm, enumerable, enlarged pulmonary nodules, bilateral lower lobe consolidation with confluent nodular opacities and tree-in-bud, apparent new hilar lymphadenopathy with right hilar node 1.2 cm and pretracheal node 1 cm, multinodular goiter.  No PE.  ASSESSMENT AND PLAN:  A 65 year old white female with atrial fibrillation  on chronic anticoagulation, hypertension, and known pulmonary nodules now with enlarged pulmonary nodules in the setting of acute infection. 1. Community-acquired pneumonia.  Agree with antibiotics.  Consider     changing to p.o. Levaquin once the patient stabilizes for     outpatient course and use albuterol nebs p.r.n. and antitussives     p.r.n. 2. Pulmonary nodules.  Differential is broad including malignancy,     connective tissues, the sarcoid.  In the setting of the acute     infection, there is  no indication for biopsy.  Recommend followup     imaging in 6-8 weeks with DT or PET/CT scan once the patient has     completed antibiotics.  If lymphadenopathy persists, would benefit     from TBNA versus EBUS-TBNA.  Anticoagulation will have to be held     for this.  Can check rheumatoid factor and ANA screens for     connective tissue disease. 3. Cardiovascular.  Per primary team.  Thank you for allowing [place] to participate in the care of your patient. Please do not hesitate to call with questions.          ______________________________ Jenetta Loges, MD     SM/MEDQ  D:  11/24/2010  T:  11/24/2010  Job:  [id number]  Electronically Signed by Jenetta Loges MD on 03/01/2011 03:11:38 PM

## 2011-06-06 DIAGNOSIS — E0789 Other specified disorders of thyroid: Secondary | ICD-10-CM | POA: Diagnosis not present

## 2011-06-06 DIAGNOSIS — E739 Lactose intolerance, unspecified: Secondary | ICD-10-CM | POA: Diagnosis not present

## 2011-06-06 DIAGNOSIS — E119 Type 2 diabetes mellitus without complications: Secondary | ICD-10-CM | POA: Diagnosis not present

## 2011-06-06 DIAGNOSIS — E789 Disorder of lipoprotein metabolism, unspecified: Secondary | ICD-10-CM | POA: Diagnosis not present

## 2011-06-13 DIAGNOSIS — E0789 Other specified disorders of thyroid: Secondary | ICD-10-CM | POA: Diagnosis not present

## 2011-06-13 DIAGNOSIS — E119 Type 2 diabetes mellitus without complications: Secondary | ICD-10-CM | POA: Diagnosis not present

## 2011-06-19 DIAGNOSIS — I10 Essential (primary) hypertension: Secondary | ICD-10-CM | POA: Diagnosis not present

## 2011-06-19 DIAGNOSIS — E079 Disorder of thyroid, unspecified: Secondary | ICD-10-CM | POA: Diagnosis not present

## 2011-06-19 DIAGNOSIS — E119 Type 2 diabetes mellitus without complications: Secondary | ICD-10-CM | POA: Diagnosis not present

## 2011-06-29 DIAGNOSIS — I495 Sick sinus syndrome: Secondary | ICD-10-CM | POA: Diagnosis not present

## 2011-06-29 DIAGNOSIS — I441 Atrioventricular block, second degree: Secondary | ICD-10-CM | POA: Diagnosis not present

## 2011-06-29 DIAGNOSIS — I4891 Unspecified atrial fibrillation: Secondary | ICD-10-CM | POA: Diagnosis not present

## 2011-08-16 DIAGNOSIS — I2510 Atherosclerotic heart disease of native coronary artery without angina pectoris: Secondary | ICD-10-CM | POA: Diagnosis not present

## 2011-08-16 DIAGNOSIS — I10 Essential (primary) hypertension: Secondary | ICD-10-CM | POA: Diagnosis not present

## 2011-08-16 DIAGNOSIS — I4891 Unspecified atrial fibrillation: Secondary | ICD-10-CM | POA: Diagnosis not present

## 2011-09-12 DIAGNOSIS — E119 Type 2 diabetes mellitus without complications: Secondary | ICD-10-CM | POA: Diagnosis not present

## 2011-09-12 DIAGNOSIS — E0789 Other specified disorders of thyroid: Secondary | ICD-10-CM | POA: Diagnosis not present

## 2011-11-07 DIAGNOSIS — R5381 Other malaise: Secondary | ICD-10-CM | POA: Diagnosis not present

## 2011-11-07 DIAGNOSIS — Z79899 Other long term (current) drug therapy: Secondary | ICD-10-CM | POA: Diagnosis not present

## 2011-11-07 DIAGNOSIS — E782 Mixed hyperlipidemia: Secondary | ICD-10-CM | POA: Diagnosis not present

## 2011-11-07 DIAGNOSIS — R5383 Other fatigue: Secondary | ICD-10-CM | POA: Diagnosis not present

## 2011-11-20 DIAGNOSIS — I4891 Unspecified atrial fibrillation: Secondary | ICD-10-CM | POA: Diagnosis not present

## 2011-12-04 DIAGNOSIS — E119 Type 2 diabetes mellitus without complications: Secondary | ICD-10-CM | POA: Diagnosis not present

## 2011-12-04 DIAGNOSIS — E0789 Other specified disorders of thyroid: Secondary | ICD-10-CM | POA: Diagnosis not present

## 2011-12-12 DIAGNOSIS — I4891 Unspecified atrial fibrillation: Secondary | ICD-10-CM | POA: Diagnosis not present

## 2011-12-12 DIAGNOSIS — R197 Diarrhea, unspecified: Secondary | ICD-10-CM | POA: Diagnosis not present

## 2011-12-12 DIAGNOSIS — IMO0001 Reserved for inherently not codable concepts without codable children: Secondary | ICD-10-CM | POA: Diagnosis not present

## 2011-12-12 DIAGNOSIS — E0789 Other specified disorders of thyroid: Secondary | ICD-10-CM | POA: Diagnosis not present

## 2011-12-20 ENCOUNTER — Encounter: Payer: Self-pay | Admitting: Internal Medicine

## 2012-01-09 DIAGNOSIS — E119 Type 2 diabetes mellitus without complications: Secondary | ICD-10-CM | POA: Diagnosis not present

## 2012-01-16 DIAGNOSIS — R197 Diarrhea, unspecified: Secondary | ICD-10-CM | POA: Diagnosis not present

## 2012-01-16 DIAGNOSIS — E119 Type 2 diabetes mellitus without complications: Secondary | ICD-10-CM | POA: Diagnosis not present

## 2012-01-16 DIAGNOSIS — E0789 Other specified disorders of thyroid: Secondary | ICD-10-CM | POA: Diagnosis not present

## 2012-02-11 DIAGNOSIS — I4891 Unspecified atrial fibrillation: Secondary | ICD-10-CM | POA: Diagnosis not present

## 2012-02-11 DIAGNOSIS — E119 Type 2 diabetes mellitus without complications: Secondary | ICD-10-CM | POA: Diagnosis not present

## 2012-02-11 DIAGNOSIS — I2510 Atherosclerotic heart disease of native coronary artery without angina pectoris: Secondary | ICD-10-CM | POA: Diagnosis not present

## 2012-02-28 DIAGNOSIS — Z23 Encounter for immunization: Secondary | ICD-10-CM | POA: Diagnosis not present

## 2012-03-02 ENCOUNTER — Encounter: Payer: Self-pay | Admitting: Internal Medicine

## 2012-03-02 ENCOUNTER — Ambulatory Visit (INDEPENDENT_AMBULATORY_CARE_PROVIDER_SITE_OTHER): Payer: Medicare Other | Admitting: Internal Medicine

## 2012-03-02 VITALS — BP 127/60 | HR 60 | Ht 59.0 in | Wt 157.0 lb

## 2012-03-02 DIAGNOSIS — I498 Other specified cardiac arrhythmias: Secondary | ICD-10-CM

## 2012-03-02 DIAGNOSIS — I480 Paroxysmal atrial fibrillation: Secondary | ICD-10-CM | POA: Insufficient documentation

## 2012-03-02 DIAGNOSIS — I4891 Unspecified atrial fibrillation: Secondary | ICD-10-CM | POA: Diagnosis not present

## 2012-03-02 DIAGNOSIS — R001 Bradycardia, unspecified: Secondary | ICD-10-CM | POA: Insufficient documentation

## 2012-03-02 LAB — PACEMAKER DEVICE OBSERVATION
AL AMPLITUDE: 2.5 mv
AL IMPEDENCE PM: 387.5 Ohm
AL THRESHOLD: 0.75 V
BAMS-0003: 70 {beats}/min
RV LEAD AMPLITUDE: 6.8 mv
RV LEAD IMPEDENCE PM: 412.5 Ohm

## 2012-03-02 NOTE — Assessment & Plan Note (Signed)
The patient has symptomatic paroxysmal atrial fibrillation.  She has failed medical therapy with multaq, diltiazem, and metoprolol. Therapeutic strategies for afib including medicine and ablation were discussed in detail with the patient today. Risk, benefits, and alternatives to EP study and radiofrequency ablation for afib were also discussed in detail today. We discussed tikosyn as a reasonable alternative. At this point, she wishes to further contemplate her options.  She does not appear ready at this point to proceed with ablation, though I do think that she would be a good candidate for the procedure. She will therefore further consider ablation as an option and will contact my office when she is ready to proceed. In the interim, she will continue her current medicines including pradaxa for stroke prevention and will follow-up with Dr [name]. I am happy to see her again at anytime for further discussion.

## 2012-03-02 NOTE — Progress Notes (Signed)
Primary Care Physician: [name], MD Referring Physician:  Dr Evette Doffing is a 66 y.o. female with a h/o DM, HTN, and atrial fibrillation who presents today for EP consultation.  She reports initially being diagnosed with atrial fibrillation in the setting of a GI virus 03/2006.  She was placed on coumadin and metoprolol at that time.  She is unaware of episodes of afib until 7/12.  She presented to [facility] with pneumonia and was found to have recurrent afib.  She had multiple post conversion pauses and therefore underwent PPM implantation by Croitoru 7/12.  She was then started on pradaxa.  She reports subsequent episodes of afib with symptoms of chest tightness and fatigue.  She was placed on multaq 6/13.  She is tolerating multaq well but continues to have afib with symptoms of fatigue and decreased exercise tolerance.  Episodes of afib last up to 18 hours at at time.   She notices occasional "skipped beats".  She has occasional presyncope with her afib.  Today, she denies symptoms of chest pain, shortness of breath, orthopnea, PND, lower extremity edema, syncope, or neurologic sequela. The patient is tolerating medications without difficulties and is otherwise without complaint today.   Past Medical History  Diagnosis Date  . Hypertension   . Paroxysmal atrial fibrillation   . Diabetes mellitus   . Allergic rhinitis   . DJD (degenerative joint disease)   . Bradycardia     s/p PPM by Dr [name]  . CAD (coronary artery disease)     70% RCA stenosis, treated medically   Past Surgical History  Procedure Date  . Cholecystectomy   . Total abdominal hysterectomy   . Pacemaker insertion 12/03/10    SJM Accent DR RF implanted by Dr [name]  . Arm surgery d/t fx     Current Outpatient Prescriptions  Medication Sig Dispense Refill  . Calcium Carbonate-Vitamin D (CALCIUM 600+D) 600-400 MG-UNIT per tablet Take 1 tablet by mouth daily.        . cetirizine (ZYRTEC) 10 MG  tablet Take 10 mg by mouth daily.      . Cholecalciferol (VITAMIN D3) 1000 UNITS CAPS Take 1 capsule by mouth daily.        . colesevelam (WELCHOL) 625 MG tablet 2 tabs in AM and 2 tabs in PM       . Dextromethorphan-Guaifenesin (GUAIFENESIN-DM) 100-10 MG/5ML LIQD Take 5 mLs by mouth every 4 (four) hours as needed.        . diltiazem (CARDIZEM) 120 MG tablet Take 120 mg by mouth daily.        . hydrochlorothiazide (MICROZIDE) 12.5 MG capsule Take 12.5 mg by mouth daily.        . methocarbamol (ROBAXIN) 500 MG tablet Take 500 mg by mouth 2 (two) times daily as needed.        . metoprolol (LOPRESSOR) 50 MG tablet Take 50 mg by mouth 2 (two) times daily.        . MULTAQ 400 MG tablet Take 1 tablet by mouth Twice daily.      . ONGLYZA 5 MG TABS tablet Take 1 tablet by mouth daily.      . pantoprazole (PROTONIX) 40 MG tablet Take 40 mg by mouth daily.        Marland Kitchen PRADAXA 150 MG CAPS Take 1 tablet by mouth Twice daily.      Marland Kitchen propylthiouracil (PTU) 50 MG tablet Take 2 tabs in the AM and 1  tab in the PM      . valsartan (DIOVAN) 80 MG tablet Take 80 mg by mouth daily.        Allergies  Allergen Reactions  . Codeine   . Demerol   . Ketorolac     History   Social History  . Marital Status: Married    Spouse Name: Loistine Chance    Number of Children: N/A  . Years of Education: N/A   Occupational History  . housewife    Social History Main Topics  . Smoking status: Never Smoker   . Smokeless tobacco: Not on file  . Alcohol Use: No  . Drug Use: No  . Sexually Active: Not on file   Other Topics Concern  . Not on file   Social History Narrative   Lives in [place].  Retired.    Family History  Problem Relation Age of Onset  . Heart disease Sister   . Breast cancer Sister   . Breast cancer Sister     ROS- All systems are reviewed and negative except as per the HPI above  Physical Exam: Filed Vitals:   03/02/12 1525  BP: 127/60  Pulse: 60  Height: 4\' 11"  (1.499 m)  Weight: 157  lb (71.215 kg)  SpO2: 98%    GEN- The patient is well appearing, alert and oriented x 3 today.   Head- normocephalic, atraumatic Eyes-  Sclera clear, conjunctiva pink Ears- hearing intact Oropharynx- clear Neck- supple, no JVP Pacemaker pocket is well healed Lungs- Clear to ausculation bilaterally, normal work of breathing Heart- Regular rate and rhythm, no murmurs, rubs or gallops, PMI not laterally displaced GI- soft, NT, ND, + BS Extremities- no clubbing, cyanosis, or edema MS- no significant deformity or atrophy Skin- no rash or lesion Psych- euthymic mood, full affect Neuro- strength and sensation are intact  EKG today reveals atrial pacing, PR , RsR', nonspecific ST/T changes Pacemaker interrogation today is reviewed and reveals normal device function Echo 11/13/11- EF 60-65%, moderate LA enlargement, though LA size is recorded at 32mm, no significant valvular disease  Myoview 12/20/10- EF 91%, normal perfusion Cath 12/03/10 reviewed  Assessment and Plan:

## 2012-03-02 NOTE — Assessment & Plan Note (Signed)
Normal pacemaker function for post termination pauses.  She is 82% a paced at this time.  She has 18% mode switch burden. No changes are made today

## 2012-03-02 NOTE — Patient Instructions (Addendum)
Your physician has recommended that you have an ablation. Catheter ablation is a medical procedure used to treat some cardiac arrhythmias (irregular heartbeats). During catheter ablation, a long, thin, flexible tube is put into a blood vessel in your groin (upper thigh), or neck. This tube is called an ablation catheter. It is then guided to your heart through the blood vessel. Radio frequency waves destroy small areas of heart tissue where abnormal heartbeats may cause an arrhythmia to start. Please see the instruction sheet given to you today.   Call if you decide to proceed 

## 2012-04-10 DIAGNOSIS — M549 Dorsalgia, unspecified: Secondary | ICD-10-CM | POA: Diagnosis not present

## 2012-04-10 DIAGNOSIS — R197 Diarrhea, unspecified: Secondary | ICD-10-CM | POA: Diagnosis not present

## 2012-04-10 DIAGNOSIS — R55 Syncope and collapse: Secondary | ICD-10-CM | POA: Diagnosis not present

## 2012-04-10 DIAGNOSIS — R911 Solitary pulmonary nodule: Secondary | ICD-10-CM | POA: Diagnosis not present

## 2012-04-10 DIAGNOSIS — R110 Nausea: Secondary | ICD-10-CM | POA: Diagnosis not present

## 2012-04-10 DIAGNOSIS — M47817 Spondylosis without myelopathy or radiculopathy, lumbosacral region: Secondary | ICD-10-CM | POA: Diagnosis not present

## 2012-04-13 DIAGNOSIS — R55 Syncope and collapse: Secondary | ICD-10-CM | POA: Diagnosis not present

## 2012-04-13 DIAGNOSIS — E119 Type 2 diabetes mellitus without complications: Secondary | ICD-10-CM | POA: Diagnosis not present

## 2012-04-17 DIAGNOSIS — R110 Nausea: Secondary | ICD-10-CM | POA: Diagnosis not present

## 2012-04-17 DIAGNOSIS — E119 Type 2 diabetes mellitus without complications: Secondary | ICD-10-CM | POA: Diagnosis not present

## 2012-04-17 DIAGNOSIS — R5383 Other fatigue: Secondary | ICD-10-CM | POA: Diagnosis not present

## 2012-04-17 DIAGNOSIS — R5381 Other malaise: Secondary | ICD-10-CM | POA: Diagnosis not present

## 2012-04-17 DIAGNOSIS — I10 Essential (primary) hypertension: Secondary | ICD-10-CM | POA: Diagnosis not present

## 2012-04-17 DIAGNOSIS — I4891 Unspecified atrial fibrillation: Secondary | ICD-10-CM | POA: Diagnosis not present

## 2012-04-22 DIAGNOSIS — I4891 Unspecified atrial fibrillation: Secondary | ICD-10-CM | POA: Diagnosis not present

## 2012-04-22 DIAGNOSIS — E0789 Other specified disorders of thyroid: Secondary | ICD-10-CM | POA: Diagnosis not present

## 2012-04-22 DIAGNOSIS — E789 Disorder of lipoprotein metabolism, unspecified: Secondary | ICD-10-CM | POA: Diagnosis not present

## 2012-04-22 DIAGNOSIS — E119 Type 2 diabetes mellitus without complications: Secondary | ICD-10-CM | POA: Diagnosis not present

## 2012-05-08 ENCOUNTER — Other Ambulatory Visit: Payer: Self-pay

## 2012-05-08 DIAGNOSIS — D485 Neoplasm of uncertain behavior of skin: Secondary | ICD-10-CM | POA: Diagnosis not present

## 2012-05-28 DIAGNOSIS — J329 Chronic sinusitis, unspecified: Secondary | ICD-10-CM | POA: Diagnosis not present

## 2012-06-09 ENCOUNTER — Telehealth: Payer: Self-pay | Admitting: Internal Medicine

## 2012-06-09 DIAGNOSIS — I2510 Atherosclerotic heart disease of native coronary artery without angina pectoris: Secondary | ICD-10-CM | POA: Diagnosis not present

## 2012-06-09 DIAGNOSIS — I10 Essential (primary) hypertension: Secondary | ICD-10-CM | POA: Diagnosis not present

## 2012-06-09 DIAGNOSIS — I4891 Unspecified atrial fibrillation: Secondary | ICD-10-CM | POA: Diagnosis not present

## 2012-06-09 NOTE — Telephone Encounter (Signed)
New problem:   Wants to set up ablation.

## 2012-06-09 NOTE — Telephone Encounter (Signed)
Will forward to dr/nurse 

## 2012-06-11 NOTE — Telephone Encounter (Signed)
Left message for pt to call and schedule appointment, she was last seen on 03/02/12. Right now there is availability on 2/5 for Dr Ladona Ridgel.

## 2012-06-12 NOTE — Telephone Encounter (Signed)
New Problem:     Patient returned your call about scheduling an appointment to be seen.  Patient states that she is a patient of Dr. Jenel Lucks.  Please call back.

## 2012-06-12 NOTE — Telephone Encounter (Signed)
Sorry - correction pt does see Dr [name]. Will ask Melissa to schedule an appointment with Dr [name], if pt has further questions I will be glad to help.

## 2012-07-08 ENCOUNTER — Encounter: Payer: Medicare Other | Admitting: Internal Medicine

## 2012-07-10 ENCOUNTER — Ambulatory Visit (INDEPENDENT_AMBULATORY_CARE_PROVIDER_SITE_OTHER): Payer: Medicare Other | Admitting: Internal Medicine

## 2012-07-10 ENCOUNTER — Encounter: Payer: Self-pay | Admitting: Internal Medicine

## 2012-07-10 ENCOUNTER — Encounter: Payer: Self-pay | Admitting: *Deleted

## 2012-07-10 VITALS — BP 129/75 | HR 94 | Ht 59.0 in | Wt 159.2 lb

## 2012-07-10 DIAGNOSIS — I498 Other specified cardiac arrhythmias: Secondary | ICD-10-CM | POA: Diagnosis not present

## 2012-07-10 DIAGNOSIS — I4891 Unspecified atrial fibrillation: Secondary | ICD-10-CM

## 2012-07-10 DIAGNOSIS — R001 Bradycardia, unspecified: Secondary | ICD-10-CM

## 2012-07-10 LAB — PACEMAKER DEVICE OBSERVATION
AL AMPLITUDE: 2.9 mv
AL IMPEDENCE PM: 412.5 Ohm
BATTERY VOLTAGE: 2.9478 V
RV LEAD IMPEDENCE PM: 425 Ohm

## 2012-07-10 NOTE — Patient Instructions (Signed)
Your physician has recommended that you have an ablation. Catheter ablation is a medical procedure used to treat some cardiac arrhythmias (irregular heartbeats). During catheter ablation, a long, thin, flexible tube is put into a blood vessel in your groin (upper thigh), or neck. This tube is called an ablation catheter. It is then guided to your heart through the blood vessel. Radio frequency waves destroy small areas of heart tissue where abnormal heartbeats may cause an arrhythmia to start. Please see the instruction sheet given to you today.    See instruction sheet  

## 2012-07-12 NOTE — Assessment & Plan Note (Signed)
The patient has symptomatic paroxysmal atrial fibrillation.  She has failed medical therapy with multaq, diltiazem, and metoprolol. Therapeutic strategies for afib including medicine and ablation were discussed in detail with the patient today. Risk, benefits, and alternatives to EP study and radiofrequency ablation for afib were also discussed in detail today. These risks include but are not limited to stroke, bleeding, vascular damage, tamponade, perforation, damage to the esophagus, lungs, and other structures, pulmonary vein stenosis, worsening renal function, and death. The patient understands these risk and wishes to proceed.  We will therefore proceed with catheter ablation at the next available time.

## 2012-07-12 NOTE — Progress Notes (Signed)
 Primary Care Physician: KIM, Rozell Kettlewell, MD Referring Physician:  Dr Croitoru   Hailey [name] is a 66 y.o. female with a h/o DM, HTN, and atrial fibrillation who presents today for EP follow-up.  She reports initially being diagnosed with atrial fibrillation in the setting of a GI virus 03/2006.   She had multiple post conversion pauses and therefore underwent PPM implantation by Croitoru 7/12.  She was then started on pradaxa.  She reports subsequent episodes of afib with symptoms of chest tightness and fatigue.  She was placed on multaq 6/13.  She is tolerating multaq well but continues to have afib with symptoms of fatigue and decreased exercise tolerance.  Episodes of afib last up to 18 hours at at time.   She notices occasional "skipped beats".  She has occasional presyncope with her afib.    Today, she denies symptoms of chest pain, shortness of breath, orthopnea, PND, lower extremity edema, syncope, or neurologic sequela. The patient is tolerating medications without difficulties and is otherwise without complaint today.   Past Medical History  Diagnosis Date  . Hypertension   . Paroxysmal atrial fibrillation   . Diabetes mellitus   . Allergic rhinitis   . DJD (degenerative joint disease)   . Bradycardia     s/p PPM by Dr Croitoru  . CAD (coronary artery disease)     70% RCA stenosis, treated medically  . Polio     as a child   Past Surgical History  Procedure Laterality Date  . Cholecystectomy    . Total abdominal hysterectomy    . Pacemaker insertion  12/03/10    SJM Accent DR RF implanted by Dr Croitoru  . Arm surgery d/t fx      Current Outpatient Prescriptions  Medication Sig Dispense Refill  . Calcium Carbonate-Vitamin D (CALCIUM 600+D) 600-400 MG-UNIT per tablet Take 1 tablet by mouth daily.        . Cholecalciferol (VITAMIN D3) 1000 UNITS CAPS Take 1 capsule by mouth daily.        . colesevelam (WELCHOL) 625 MG tablet 2 tabs in AM and 2 tabs in PM       .  Dextromethorphan-Guaifenesin (GUAIFENESIN-DM) 100-10 MG/5ML LIQD Take 5 mLs by mouth every 4 (four) hours as needed.        . diltiazem (CARDIZEM) 120 MG tablet Take 120 mg by mouth daily.        . hydrochlorothiazide (MICROZIDE) 12.5 MG capsule Take 12.5 mg by mouth daily.        . methocarbamol (ROBAXIN) 500 MG tablet Take 500 mg by mouth 2 (two) times daily as needed.        . metoprolol (LOPRESSOR) 50 MG tablet Take 50 mg by mouth 2 (two) times daily.        . MULTAQ 400 MG tablet Take 1 tablet by mouth Twice daily.      . ONGLYZA 5 MG TABS tablet Take 1 tablet by mouth daily.      . pantoprazole (PROTONIX) 40 MG tablet Take 40 mg by mouth daily.        . PRADAXA 150 MG CAPS Take 1 tablet by mouth Twice daily.      . propylthiouracil (PTU) 50 MG tablet Take 2 tabs in the AM and 1 tab in the PM      . valsartan (DIOVAN) 80 MG tablet Take 80 mg by mouth daily.       No current facility-administered medications for this visit.      Allergies  Allergen Reactions  . Codeine   . Demerol   . Ketorolac     History   Social History  . Marital Status: Married    Spouse Name: Philip    Number of Children: N/A  . Years of Education: N/A   Occupational History  . housewife    Social History Main Topics  . Smoking status: Never Smoker   . Smokeless tobacco: Not on file  . Alcohol Use: No  . Drug Use: No  . Sexually Active: Not on file   Other Topics Concern  . Not on file   Social History Narrative   Lives in [place].  Retired.    Family History  Problem Relation Age of Onset  . Heart disease Sister   . Breast cancer Sister   . Breast cancer Sister     ROS- All systems are reviewed and negative except as per the HPI above  Physical Exam: Filed Vitals:   07/10/12 0907  BP: 129/75  Pulse: 94  Height: 4' 11" (1.499 m)  Weight: 159 lb 3.2 oz (72.213 kg)    GEN- The patient is well appearing, alert and oriented x 3 today.   Head- normocephalic, atraumatic Eyes-   Sclera clear, conjunctiva pink Ears- hearing intact Oropharynx- clear Neck- supple, no JVP Pacemaker pocket is well healed Lungs- Clear to ausculation bilaterally, normal work of breathing Heart- Regular rate and rhythm, no murmurs, rubs or gallops, PMI not laterally displaced GI- soft, NT, ND, + BS Extremities- no clubbing, cyanosis, or edema MS- no significant deformity or atrophy Skin- no rash or lesion Psych- euthymic mood, full affect Neuro- strength and sensation are intact  EKG today reveals atrial pacing, PR 200, RsR', nonspecific ST/t changes Pacemaker interrogation today is reviewed and reveals normal device function, afib burden is 24% Echo 11/13/11- EF 60-65%, moderate LA enlargement, though LA size is recorded at 32mm, no significant valvular disease  Myoview 12/20/10- EF 91%, normal perfusion Cath 12/03/10 reviewed  Assessment and Plan:  

## 2012-07-12 NOTE — Assessment & Plan Note (Signed)
Normal pacemaker function for post termination pauses.  She has 24 % mode switch burden. No changes are made today

## 2012-07-14 ENCOUNTER — Other Ambulatory Visit: Payer: Self-pay | Admitting: *Deleted

## 2012-07-20 DIAGNOSIS — E0789 Other specified disorders of thyroid: Secondary | ICD-10-CM | POA: Diagnosis not present

## 2012-07-20 DIAGNOSIS — E119 Type 2 diabetes mellitus without complications: Secondary | ICD-10-CM | POA: Diagnosis not present

## 2012-07-20 DIAGNOSIS — I10 Essential (primary) hypertension: Secondary | ICD-10-CM | POA: Diagnosis not present

## 2012-07-22 ENCOUNTER — Encounter (HOSPITAL_COMMUNITY): Payer: Self-pay

## 2012-07-23 DIAGNOSIS — R7309 Other abnormal glucose: Secondary | ICD-10-CM | POA: Diagnosis not present

## 2012-07-23 DIAGNOSIS — E119 Type 2 diabetes mellitus without complications: Secondary | ICD-10-CM | POA: Diagnosis not present

## 2012-07-23 DIAGNOSIS — E789 Disorder of lipoprotein metabolism, unspecified: Secondary | ICD-10-CM | POA: Diagnosis not present

## 2012-07-23 DIAGNOSIS — E0789 Other specified disorders of thyroid: Secondary | ICD-10-CM | POA: Diagnosis not present

## 2012-07-23 DIAGNOSIS — I10 Essential (primary) hypertension: Secondary | ICD-10-CM | POA: Diagnosis not present

## 2012-07-24 ENCOUNTER — Ambulatory Visit (INDEPENDENT_AMBULATORY_CARE_PROVIDER_SITE_OTHER): Payer: Medicare Other | Admitting: *Deleted

## 2012-07-24 DIAGNOSIS — I4891 Unspecified atrial fibrillation: Secondary | ICD-10-CM

## 2012-07-24 DIAGNOSIS — R599 Enlarged lymph nodes, unspecified: Secondary | ICD-10-CM | POA: Diagnosis not present

## 2012-07-24 DIAGNOSIS — R911 Solitary pulmonary nodule: Secondary | ICD-10-CM

## 2012-07-24 DIAGNOSIS — I498 Other specified cardiac arrhythmias: Secondary | ICD-10-CM | POA: Diagnosis not present

## 2012-07-24 DIAGNOSIS — R001 Bradycardia, unspecified: Secondary | ICD-10-CM

## 2012-07-24 DIAGNOSIS — R590 Localized enlarged lymph nodes: Secondary | ICD-10-CM

## 2012-07-24 LAB — CBC WITH DIFFERENTIAL/PLATELET
Basophils Absolute: 0 10*3/uL (ref 0.0–0.1)
Basophils Relative: 0.5 % (ref 0.0–3.0)
Eosinophils Absolute: 0.1 10*3/uL (ref 0.0–0.7)
HCT: 36.5 % (ref 36.0–46.0)
Hemoglobin: 11.9 g/dL — ABNORMAL LOW (ref 12.0–15.0)
Lymphs Abs: 1.6 10*3/uL (ref 0.7–4.0)
MCHC: 32.7 g/dL (ref 30.0–36.0)
MCV: 83.9 fl (ref 78.0–100.0)
Monocytes Absolute: 0.5 10*3/uL (ref 0.1–1.0)
Neutro Abs: 4.5 10*3/uL (ref 1.4–7.7)
RBC: 4.36 Mil/uL (ref 3.87–5.11)
RDW: 14.7 % — ABNORMAL HIGH (ref 11.5–14.6)

## 2012-07-24 LAB — BASIC METABOLIC PANEL
BUN: 9 mg/dL (ref 6–23)
CO2: 28 mEq/L (ref 19–32)
Chloride: 104 mEq/L (ref 96–112)
Creatinine, Ser: 0.9 mg/dL (ref 0.4–1.2)

## 2012-07-26 HISTORY — PX: BACK SURGERY: SHX140

## 2012-07-29 ENCOUNTER — Encounter (HOSPITAL_COMMUNITY): Payer: Self-pay | Admitting: Pharmacy Technician

## 2012-07-29 DIAGNOSIS — H5200 Hypermetropia, unspecified eye: Secondary | ICD-10-CM | POA: Diagnosis not present

## 2012-07-29 DIAGNOSIS — H52229 Regular astigmatism, unspecified eye: Secondary | ICD-10-CM | POA: Diagnosis not present

## 2012-07-29 DIAGNOSIS — E119 Type 2 diabetes mellitus without complications: Secondary | ICD-10-CM | POA: Diagnosis not present

## 2012-07-29 DIAGNOSIS — H524 Presbyopia: Secondary | ICD-10-CM | POA: Diagnosis not present

## 2012-07-30 ENCOUNTER — Encounter (HOSPITAL_COMMUNITY): Payer: Self-pay | Admitting: *Deleted

## 2012-07-30 ENCOUNTER — Ambulatory Visit (HOSPITAL_COMMUNITY)
Admission: RE | Admit: 2012-07-30 | Discharge: 2012-07-30 | Disposition: A | Discharge status: Home / Self Care | Payer: Medicare Other | Source: Ambulatory Visit | Attending: Cardiovascular Disease | Admitting: Cardiovascular Disease

## 2012-07-30 ENCOUNTER — Encounter (HOSPITAL_COMMUNITY)
Admission: RE | Disposition: A | Discharge status: Home / Self Care | Payer: Self-pay | Source: Ambulatory Visit | Attending: Cardiovascular Disease

## 2012-07-30 DIAGNOSIS — I4891 Unspecified atrial fibrillation: Secondary | ICD-10-CM | POA: Diagnosis not present

## 2012-07-30 HISTORY — PX: TEE WITHOUT CARDIOVERSION: SHX5443

## 2012-07-30 LAB — GLUCOSE, CAPILLARY: Glucose-Capillary: 101 mg/dL — ABNORMAL HIGH (ref 70–99)

## 2012-07-30 SURGERY — ECHOCARDIOGRAM, TRANSESOPHAGEAL
Anesthesia: Moderate Sedation

## 2012-07-30 MED ORDER — FENTANYL CITRATE 0.05 MG/ML IJ SOLN
INTRAMUSCULAR | Status: AC
  Filled 2012-07-30: qty 2

## 2012-07-30 MED ORDER — FENTANYL CITRATE 0.05 MG/ML IJ SOLN
INTRAMUSCULAR | Status: DC | PRN
  Administered 2012-07-30 (×2): 25 ug via INTRAVENOUS

## 2012-07-30 MED ORDER — DIPHENHYDRAMINE HCL 50 MG/ML IJ SOLN
INTRAMUSCULAR | Status: AC
  Filled 2012-07-30: qty 1

## 2012-07-30 MED ORDER — BUTAMBEN-TETRACAINE-BENZOCAINE 2-2-14 % EX AERO
INHALATION_SPRAY | CUTANEOUS | Status: DC | PRN
  Administered 2012-07-30: 2 via TOPICAL

## 2012-07-30 MED ORDER — SODIUM CHLORIDE 0.9 % IV SOLN: INTRAVENOUS | Status: DC

## 2012-07-30 MED ORDER — MIDAZOLAM HCL 5 MG/ML IJ SOLN
INTRAMUSCULAR | Status: AC
  Filled 2012-07-30: qty 2

## 2012-07-30 MED ORDER — MIDAZOLAM HCL 10 MG/2ML IJ SOLN
INTRAMUSCULAR | Status: DC | PRN
  Administered 2012-07-30: 1 mg via INTRAVENOUS
  Administered 2012-07-30 (×2): 2 mg via INTRAVENOUS

## 2012-07-30 NOTE — CV Procedure (Signed)
Hailey, [name] Female, 67 y.o., 1945-10-20  MRN: [id number]  CSN: [id number]  Admit Dt: 07/30/12   INDICATIONS: atrial fibrillation preablation  PROCEDURE:   Informed consent was obtained prior to the procedure. The risks, benefits and alternatives for the procedure were discussed and the patient comprehended these risks.  Risks include, but are not limited to, cough, sore throat, vomiting, nausea, somnolence, esophageal and stomach trauma or perforation, bleeding, low blood pressure, aspiration, pneumonia, infection, trauma to the teeth and death.    After a procedural time-out, the oropharynx was anesthetized with 20% benzocaine spray. The patient was given 5 mg versed and 50 mcg fentanyl for moderate sedation.   The transesophageal probe was inserted in the esophagus and stomach without difficulty and multiple views were obtained.  The patient was kept under observation until the patient left the procedure room.  The patient left the procedure room in stable condition.   Agitated microbubble saline contrast was not administered.  COMPLICATIONS:    There were no immediate complications.  FINDINGS:  No evidence of left atrial thrombus. Incessant atrial bigeminy noted (PACs blocked/in refractory period). Normal LV systolic function and regional wall motion. Moderate TR. Mild to moderate pulmonary hypertension (45 mm Hg). Mild aortic atherosclerosis.  RECOMMENDATIONS:   Proceed with RF ablation in AM.  Time Spent Directly with the Patient:  45 minutes   Kandy Towery 07/30/2012, 12:45 PM

## 2012-07-30 NOTE — H&P (Signed)
  Date of Initial H&P: 07/10/2012 Dr Hillis Range  History reviewed, patient examined, no change in status, stable for surgery.  Here for TEE prior to RF ablation for PAF in AM. This procedure has been fully reviewed with the patient and written informed consent has been obtained. [name], MD, [facility] [phone] office [phone] pager

## 2012-07-30 NOTE — Progress Notes (Signed)
Echocardiogram Echocardiogram Transesophageal has been performed.  Alyssha Housh 07/30/2012, 1:24 PM

## 2012-07-31 ENCOUNTER — Encounter (HOSPITAL_COMMUNITY): Payer: Self-pay | Admitting: Certified Registered"

## 2012-07-31 ENCOUNTER — Encounter (HOSPITAL_COMMUNITY): Payer: Self-pay | Admitting: Cardiovascular Disease

## 2012-07-31 ENCOUNTER — Encounter (HOSPITAL_COMMUNITY)
Admission: RE | Disposition: A | Discharge status: Home / Self Care | Payer: Self-pay | Source: Ambulatory Visit | Attending: Internal Medicine

## 2012-07-31 ENCOUNTER — Ambulatory Visit (HOSPITAL_COMMUNITY): Payer: Medicare Other | Admitting: Certified Registered"

## 2012-07-31 ENCOUNTER — Ambulatory Visit (HOSPITAL_COMMUNITY)
Admission: RE | Admit: 2012-07-31 | Discharge: 2012-08-01 | Disposition: A | Discharge status: Home / Self Care | Payer: Medicare Other | Source: Ambulatory Visit | Attending: Internal Medicine | Admitting: Internal Medicine

## 2012-07-31 DIAGNOSIS — E876 Hypokalemia: Secondary | ICD-10-CM | POA: Insufficient documentation

## 2012-07-31 DIAGNOSIS — E119 Type 2 diabetes mellitus without complications: Secondary | ICD-10-CM | POA: Diagnosis not present

## 2012-07-31 DIAGNOSIS — I498 Other specified cardiac arrhythmias: Secondary | ICD-10-CM | POA: Diagnosis not present

## 2012-07-31 DIAGNOSIS — Z950 Presence of cardiac pacemaker: Secondary | ICD-10-CM | POA: Diagnosis not present

## 2012-07-31 DIAGNOSIS — R590 Localized enlarged lymph nodes: Secondary | ICD-10-CM

## 2012-07-31 DIAGNOSIS — R001 Bradycardia, unspecified: Secondary | ICD-10-CM | POA: Diagnosis present

## 2012-07-31 DIAGNOSIS — I4891 Unspecified atrial fibrillation: Secondary | ICD-10-CM | POA: Diagnosis not present

## 2012-07-31 DIAGNOSIS — I2510 Atherosclerotic heart disease of native coronary artery without angina pectoris: Secondary | ICD-10-CM | POA: Insufficient documentation

## 2012-07-31 DIAGNOSIS — M1990 Unspecified osteoarthritis, unspecified site: Secondary | ICD-10-CM | POA: Diagnosis not present

## 2012-07-31 DIAGNOSIS — I480 Paroxysmal atrial fibrillation: Secondary | ICD-10-CM | POA: Diagnosis present

## 2012-07-31 DIAGNOSIS — I10 Essential (primary) hypertension: Secondary | ICD-10-CM | POA: Insufficient documentation

## 2012-07-31 DIAGNOSIS — R911 Solitary pulmonary nodule: Secondary | ICD-10-CM

## 2012-07-31 HISTORY — PX: CARDIAC ELECTROPHYSIOLOGY MAPPING AND ABLATION: SHX1292

## 2012-07-31 HISTORY — PX: ATRIAL FIBRILLATION ABLATION: SHX5456

## 2012-07-31 LAB — POCT ACTIVATED CLOTTING TIME
Activated Clotting Time: 448 seconds
Activated Clotting Time: 235 seconds

## 2012-07-31 SURGERY — ATRIAL FIBRILLATION ABLATION
Anesthesia: Monitor Anesthesia Care

## 2012-07-31 MED ORDER — ONDANSETRON HCL 4 MG/2ML IJ SOLN
INTRAMUSCULAR | Status: DC | PRN
  Administered 2012-07-31: 4 mg via INTRAVENOUS

## 2012-07-31 MED ORDER — SODIUM CHLORIDE 0.9 % IV SOLN: 250.0000 mL | INTRAVENOUS | Status: DC | PRN

## 2012-07-31 MED ORDER — PANTOPRAZOLE SODIUM 40 MG PO TBEC: 40.0000 mg | DELAYED_RELEASE_TABLET | Freq: Every day | ORAL | Status: DC

## 2012-07-31 MED ORDER — PROPYLTHIOURACIL 50 MG PO TABS
100.0000 mg | ORAL_TABLET | Freq: Every morning | ORAL | Status: DC
  Filled 2012-07-31: qty 2

## 2012-07-31 MED ORDER — FENTANYL CITRATE 0.05 MG/ML IJ SOLN
INTRAMUSCULAR | Status: DC | PRN
  Administered 2012-07-31 (×5): 25 ug via INTRAVENOUS

## 2012-07-31 MED ORDER — DABIGATRAN ETEXILATE MESYLATE 150 MG PO CAPS
150.0000 mg | ORAL_CAPSULE | Freq: Two times a day (BID) | ORAL | Status: DC
  Administered 2012-07-31: 21:00:00 150 mg via ORAL
  Filled 2012-07-31 (×3): qty 1

## 2012-07-31 MED ORDER — LIDOCAINE HCL (PF) 1 % IJ SOLN
INTRAMUSCULAR | Status: AC
  Filled 2012-07-31: qty 30

## 2012-07-31 MED ORDER — SODIUM CHLORIDE 0.9 % IV SOLN
INTRAVENOUS | Status: DC | PRN
  Administered 2012-07-31: 11:00:00 via INTRAVENOUS

## 2012-07-31 MED ORDER — PROPYLTHIOURACIL 50 MG PO TABS: 50.0000 mg | ORAL_TABLET | Freq: Two times a day (BID) | ORAL | Status: DC

## 2012-07-31 MED ORDER — ACETAMINOPHEN 325 MG PO TABS: 650.0000 mg | ORAL_TABLET | ORAL | Status: DC | PRN

## 2012-07-31 MED ORDER — SODIUM CHLORIDE 0.9 % IJ SOLN: 3.0000 mL | INTRAMUSCULAR | Status: DC | PRN

## 2012-07-31 MED ORDER — LINAGLIPTIN 5 MG PO TABS
5.0000 mg | ORAL_TABLET | Freq: Every day | ORAL | Status: DC
  Filled 2012-07-31 (×2): qty 1

## 2012-07-31 MED ORDER — HYDROXYUREA 500 MG PO CAPS
ORAL_CAPSULE | ORAL | Status: AC
  Filled 2012-07-31: qty 1

## 2012-07-31 MED ORDER — ONDANSETRON HCL 4 MG/2ML IJ SOLN
4.0000 mg | Freq: Four times a day (QID) | INTRAMUSCULAR | Status: DC | PRN
  Filled 2012-07-31: qty 2

## 2012-07-31 MED ORDER — PROPOFOL INFUSION 10 MG/ML OPTIME
INTRAVENOUS | Status: DC | PRN
  Administered 2012-07-31: 25 ug/kg/min via INTRAVENOUS

## 2012-07-31 MED ORDER — PROPYLTHIOURACIL 50 MG PO TABS
50.0000 mg | ORAL_TABLET | Freq: Every evening | ORAL | Status: DC
  Filled 2012-07-31 (×2): qty 1

## 2012-07-31 MED ORDER — DILTIAZEM HCL ER 120 MG PO CP24
120.0000 mg | ORAL_CAPSULE | Freq: Every day | ORAL | Status: DC
  Filled 2012-07-31: qty 1

## 2012-07-31 MED ORDER — ONDANSETRON HCL 4 MG/2ML IJ SOLN
4.0000 mg | Freq: Once | INTRAMUSCULAR | Status: AC
  Administered 2012-07-31: 4 mg via INTRAVENOUS

## 2012-07-31 MED ORDER — MIDAZOLAM HCL 5 MG/5ML IJ SOLN
INTRAMUSCULAR | Status: DC | PRN
  Administered 2012-07-31: 2 mg via INTRAVENOUS

## 2012-07-31 MED ORDER — COLESEVELAM HCL 625 MG PO TABS
1250.0000 mg | ORAL_TABLET | Freq: Two times a day (BID) | ORAL | Status: DC
  Administered 2012-08-01: 07:00:00 1250 mg via ORAL
  Filled 2012-07-31 (×3): qty 2

## 2012-07-31 MED ORDER — SODIUM CHLORIDE 0.9 % IJ SOLN
3.0000 mL | Freq: Two times a day (BID) | INTRAMUSCULAR | Status: DC
  Administered 2012-07-31: 3 mL via INTRAVENOUS

## 2012-07-31 MED ORDER — PROTAMINE SULFATE 10 MG/ML IV SOLN
INTRAVENOUS | Status: DC | PRN
  Administered 2012-07-31: 40 mg via INTRAVENOUS

## 2012-07-31 MED ORDER — METOPROLOL TARTRATE 50 MG PO TABS
50.0000 mg | ORAL_TABLET | Freq: Two times a day (BID) | ORAL | Status: DC
  Administered 2012-07-31: 50 mg via ORAL
  Filled 2012-07-31 (×3): qty 1

## 2012-07-31 MED ORDER — SODIUM CHLORIDE 0.9 % IV SOLN
2000.0000 ug | INTRAVENOUS | Status: DC | PRN
  Administered 2012-07-31: 20 ug/min via INTRAVENOUS

## 2012-07-31 MED ORDER — METHOCARBAMOL 500 MG PO TABS: 250.0000 mg | ORAL_TABLET | Freq: Two times a day (BID) | ORAL | Status: DC | PRN

## 2012-07-31 NOTE — Transfer of Care (Signed)
Immediate Anesthesia Transfer of Care Note  Patient: Hailey [name]  Procedure(s) Performed: Procedure(s): ATRIAL FIBRILLATION ABLATION (N/A)  Patient Location: PACU  Anesthesia Type:MAC  Level of Consciousness: sedated  Airway & Oxygen Therapy: Patient Spontanous Breathing and Patient connected to nasal cannula oxygen  Post-op Assessment: Report given to PACU RN  Post vital signs: Reviewed and stable  Complications: No apparent anesthesia complications

## 2012-07-31 NOTE — H&P (View-Only) (Signed)
Primary Care Physician: [name], MD Referring Physician:  Dr Evette Doffing is a 67 y.o. female with a h/o DM, HTN, and atrial fibrillation who presents today for EP follow-up.  She reports initially being diagnosed with atrial fibrillation in the setting of a GI virus 03/2006.   She had multiple post conversion pauses and therefore underwent PPM implantation by [facility] 7/12.  She was then started on pradaxa.  She reports subsequent episodes of afib with symptoms of chest tightness and fatigue.  She was placed on multaq 6/13.  She is tolerating multaq well but continues to have afib with symptoms of fatigue and decreased exercise tolerance.  Episodes of afib last up to 18 hours at at time.   She notices occasional "skipped beats".  She has occasional presyncope with her afib.    Today, she denies symptoms of chest pain, shortness of breath, orthopnea, PND, lower extremity edema, syncope, or neurologic sequela. The patient is tolerating medications without difficulties and is otherwise without complaint today.   Past Medical History  Diagnosis Date  . Hypertension   . Paroxysmal atrial fibrillation   . Diabetes mellitus   . Allergic rhinitis   . DJD (degenerative joint disease)   . Bradycardia     s/p PPM by Dr [name]  . CAD (coronary artery disease)     70% RCA stenosis, treated medically  . Polio     as a child   Past Surgical History  Procedure Laterality Date  . Cholecystectomy    . Total abdominal hysterectomy    . Pacemaker insertion  12/03/10    SJM Accent DR RF implanted by Dr [name]  . Arm surgery d/t fx      Current Outpatient Prescriptions  Medication Sig Dispense Refill  . Calcium Carbonate-Vitamin D (CALCIUM 600+D) 600-400 MG-UNIT per tablet Take 1 tablet by mouth daily.        . Cholecalciferol (VITAMIN D3) 1000 UNITS CAPS Take 1 capsule by mouth daily.        . colesevelam (WELCHOL) 625 MG tablet 2 tabs in AM and 2 tabs in PM       .  Dextromethorphan-Guaifenesin (GUAIFENESIN-DM) 100-10 MG/5ML LIQD Take 5 mLs by mouth every 4 (four) hours as needed.        . diltiazem (CARDIZEM) 120 MG tablet Take 120 mg by mouth daily.        . hydrochlorothiazide (MICROZIDE) 12.5 MG capsule Take 12.5 mg by mouth daily.        . methocarbamol (ROBAXIN) 500 MG tablet Take 500 mg by mouth 2 (two) times daily as needed.        . metoprolol (LOPRESSOR) 50 MG tablet Take 50 mg by mouth 2 (two) times daily.        . MULTAQ 400 MG tablet Take 1 tablet by mouth Twice daily.      . ONGLYZA 5 MG TABS tablet Take 1 tablet by mouth daily.      . pantoprazole (PROTONIX) 40 MG tablet Take 40 mg by mouth daily.        Marland Kitchen PRADAXA 150 MG CAPS Take 1 tablet by mouth Twice daily.      Marland Kitchen propylthiouracil (PTU) 50 MG tablet Take 2 tabs in the AM and 1 tab in the PM      . valsartan (DIOVAN) 80 MG tablet Take 80 mg by mouth daily.       No current facility-administered medications for this visit.  Allergies  Allergen Reactions  . Codeine   . Demerol   . Ketorolac     History   Social History  . Marital Status: Married    Spouse Name: Loistine Chance    Number of Children: N/A  . Years of Education: N/A   Occupational History  . housewife    Social History Main Topics  . Smoking status: Never Smoker   . Smokeless tobacco: Not on file  . Alcohol Use: No  . Drug Use: No  . Sexually Active: Not on file   Other Topics Concern  . Not on file   Social History Narrative   Lives in [place].  Retired.    Family History  Problem Relation Age of Onset  . Heart disease Sister   . Breast cancer Sister   . Breast cancer Sister     ROS- All systems are reviewed and negative except as per the HPI above  Physical Exam: Filed Vitals:   07/10/12 0907  BP: 129/75  Pulse: 94  Height: 4\' 11"  (1.499 m)  Weight: 159 lb 3.2 oz (72.213 kg)    GEN- The patient is well appearing, alert and oriented x 3 today.   Head- normocephalic, atraumatic Eyes-   Sclera clear, conjunctiva pink Ears- hearing intact Oropharynx- clear Neck- supple, no JVP Pacemaker pocket is well healed Lungs- Clear to ausculation bilaterally, normal work of breathing Heart- Regular rate and rhythm, no murmurs, rubs or gallops, PMI not laterally displaced GI- soft, NT, ND, + BS Extremities- no clubbing, cyanosis, or edema MS- no significant deformity or atrophy Skin- no rash or lesion Psych- euthymic mood, full affect Neuro- strength and sensation are intact  EKG today reveals atrial pacing, PR 200, RsR', nonspecific ST/t changes Pacemaker interrogation today is reviewed and reveals normal device function, afib burden is 24% Echo 11/13/11- EF 60-65%, moderate LA enlargement, though LA size is recorded at 32mm, no significant valvular disease  Myoview 12/20/10- EF 91%, normal perfusion Cath 12/03/10 reviewed  Assessment and Plan:

## 2012-07-31 NOTE — Anesthesia Preprocedure Evaluation (Addendum)
Anesthesia Evaluation  Patient identified by MRN, date of birth, ID band Patient awake    Reviewed: Allergy & Precautions, H&P , NPO status , Patient's Chart, lab work & pertinent test results, reviewed documented beta blocker date and time   History of Anesthesia Complications Negative for: history of anesthetic complications  Airway Mallampati: II TM Distance: >3 FB Neck ROM: Full    Dental  (+) Dental Advisory Given   Pulmonary neg pulmonary ROS,  breath sounds clear to auscultation        Cardiovascular hypertension, + CAD + dysrhythmias Atrial Fibrillation Rhythm:Irregular Rate:Normal     Neuro/Psych negative neurological ROS     GI/Hepatic   Endo/Other  diabetesMorbid obesity  Renal/GU   negative genitourinary   Musculoskeletal   Abdominal (+) + obese,   Peds  Hematology   Anesthesia Other Findings   Reproductive/Obstetrics                          Anesthesia Physical Anesthesia Plan  ASA: III  Anesthesia Plan: MAC   Post-op Pain Management:    Induction: Intravenous  Airway Management Planned: Simple Face Mask  Additional Equipment:   Intra-op Plan:   Post-operative Plan:   Informed Consent: I have reviewed the patients History and Physical, chart, labs and discussed the procedure including the risks, benefits and alternatives for the proposed anesthesia with the patient or authorized representative who has indicated his/her understanding and acceptance.     Plan Discussed with: CRNA and Surgeon  Anesthesia Plan Comments:        Anesthesia Quick Evaluation

## 2012-07-31 NOTE — Op Note (Signed)
SURGEON:  Hillis Range, MD  PREPROCEDURE DIAGNOSES: 1. Paroxysmal atrial fibrillation.  POSTPROCEDURE DIAGNOSES: 1. Paroxysmal  atrial fibrillation.  PROCEDURES: 1. Comprehensive electrophysiologic study. 2. Coronary sinus pacing and recording. 3. Three-dimensional mapping of atrial fibrillation 4. Ablation of atrial fibrillation 5. Intracardiac echocardiography. 6. Transseptal puncture of an intact septum. 7. Rotational Angiography with processing at an independent workstation 8. Arrhythmia induction with pacing with isuprel infusion 9. Dual chamber pacemaker interrogation and reprogramming  INTRODUCTION:  Hailey [name] is a 67 y.o. female with a history of paroxysmal atrial fibrillation who now presents for EP study and radiofrequency ablation.  The patient reports initially being diagnosed with atrial fibrillation after presenting with symptomatic palpitations and fatgiue. The patient reports increasing frequency and duration of atrial fibrillation since that time.  The patient has failed medical therapy with Multaq.  The patient therefore presents today for catheter ablation of atrial fibrillation.  DESCRIPTION OF PROCEDURE:  Informed written consent was obtained, and the patient was brought to the electrophysiology lab in a fasting state.  The patient was adequately sedated with intravenous medications as outlined in the anesthesia report.  The patient's left and right groins were prepped and draped in the usual sterile fashion by the EP lab staff.  Using a percutaneous Seldinger technique, two 7-French and one 11-French hemostasis sheaths were placed into the right common femoral vein.    3 Dimensional Rotational Angiography: A 5 french pigtail catheter was introduced through the right common femoral vein and advanced into the inferior venocava.  3 demential rotational angiography was then performed by power injection of 100cc of nonionic contrast.  Reprocessing at an independent work  station was then performed.   This demonstrated a moderate sized left atrium with 4 separate pulmonary veins which were also moderate in size.  There were no anomalous veins or significant abnormalities.  A 3 dimensional rendering of the left atrium was then merged using [employer] onto the WellPoint system and registered with intracardiac echo (see below).  The pigtail catheter was then removed.  Catheter Placement:  A 7-French Biosense Webster Decapolar coronary sinus catheter was introduced through the right common femoral vein and advanced into the coronary sinus for recording and pacing from this location.  A 6-French quadripolar Josephson catheter was introduced through the right common femoral vein and advanced into the right ventricle for recording and pacing.  This catheter was then pulled back to the His bundle location.    Initial Measurements: The patient presented to the electrophysiology lab in sinus rhythm with VIP pacing observed.  Her PR interval measured with a QRS duringation of 92 and a QT interval of 492 msec.  The AH interval measured 95 and the HV interval measured 53 msec.     Intracardiac Echocardiography: A 10-French Biosense Webster AcuNav intracardiac echocardiography catheter was introduced through the left common femoral vein and advanced into the right atrium. Intracardiac echocardiography was performed of the left atrium, and a three-dimensional anatomical rendering of the left atrium was performed using CARTO sound technology.  The patient was noted to have a large sized left atrium.  The interatrial septum was prominent but not aneurysmal. All 4 pulmonary veins were visualized and noted to have separate ostia.  The pulmonary veins were moderate in size.  The left atrial appendage was visualized and did not reveal thrombus.   There was no evidence of pulmonary vein stenosis.   Transseptal Puncture: The middle right common femoral vein sheath was  exchanged  for an 8.5 [place] SL2 transseptal sheath and transseptal access was achieved in a standard fashion using a Brockenbrough needle under biplane fluoroscopy with intracardiac echocardiography confirmation of the transseptal puncture.  Once transseptal access had been achieved, heparin was administered intravenously and intra- arterially in order to maintain an ACT of greater than 350 seconds throughout the procedure.   3D Mapping and Ablation: The His bundle catheter was removed and in its place a 3.5 mm Biosense Webster EZ [employer] ablation catheter was advanced into the right atrium.  The transseptal sheath was pulled back into the IVC over a guidewire.  The ablation catheter was advanced across the transseptal hole using the wire as a guide.  The transseptal sheath was then re-advanced over the guidewire into the left atrium.  A duodecapolar Biosense Webster circular mapping catheter was introduced through the transseptal sheath and positioned over the mouth of all 4 pulmonary veins.  Three-dimensional electroanatomical mapping was performed using CARTO technology.  This demonstrated electrical activity within all four pulmonary veins at baseline. The patient underwent successful sequential electrical isolation and anatomical encircling of all four pulmonary veins using radiofrequency current with a circular mapping catheter as a guide. The right inferior pulmonary vein was small and could not be well engaged with the circular catheter.  Isolation of this vein could therefore not be confirmed.  Measurements Following Ablation: Following ablation, Isuprel was infused up to 20 mcg/min with no inducible atrial fibrillation, atrial tachycardia, atrial flutter, or sustained PACs. Following ablation the AH interval measured 74 msec with an HV interval of 40 msec. Ventricular pacing was performed, which revealed VA dissociation when pacing at 600 msec.  Rapid atrial pacing was performed, which  revealed an AV Wenckebach cycle length of 490 msec.  Electroisolation was then again confirmed in all four pulmonary veins.  No arrhythmias were observed with pacing down to a cycle length of .  The patient's pacemaker (SJM Accent DR RF) was then interrogated and programmed DDDR 60, 110 with PAV 275 and SAV 250.  Atrial and ventricular pace/sense/impedance measurements were stable.  The procedure was therefore considered completed.  All catheters were removed, and the sheaths were aspirated and flushed.  The patient was transferred to the recovery area for sheath removal per protocol.  A limited bedside transthoracic echocardiogram revealed no pericardial effusion.  There were no early apparent complications.  CONCLUSIONS: 1. Sinus rhythm upon presentation.   2. Rotational Angiography reveals a moderate sized left atrium with four separate pulmonary veins without evidence of pulmonary vein stenosis. 3. Successful electrical isolation and anatomical encircling of all four pulmonary veins with radiofrequency current. 4. No inducible arrhythmias following ablation both on and off of Isuprel 5. No early apparent complications.   Shariya Gaster,MD 1:59 PM 07/31/2012

## 2012-07-31 NOTE — Preoperative (Signed)
Beta Blockers   Reason not to administer Beta Blockers:Not Applicable 

## 2012-07-31 NOTE — Anesthesia Postprocedure Evaluation (Signed)
  Anesthesia Post-op Note  Patient: Hailey [name]  Procedure(s) Performed: Procedure(s): ATRIAL FIBRILLATION ABLATION (N/A)  Patient Location: Cath Lab  Anesthesia Type:MAC  Level of Consciousness: awake and alert   Airway and Oxygen Therapy: Patient Spontanous Breathing  Post-op Pain: mild  Post-op Assessment: Post-op Vital signs reviewed, Patient's Cardiovascular Status Stable, Respiratory Function Stable, Patent Airway, No signs of Nausea or vomiting and Pain level controlled  Post-op Vital Signs: stable  Complications: No apparent anesthesia complications

## 2012-07-31 NOTE — Interval H&P Note (Signed)
History and Physical Interval Note:  07/31/2012 11:29 AM  Hailey [name]  has presented today for surgery, with the diagnosis of afib  The various methods of treatment have been discussed with the patient and family. After consideration of risks, benefits and other options for treatment, the patient has consented to  Procedure(s): ATRIAL FIBRILLATION ABLATION (N/A) as a surgical intervention .  The patient's history has been reviewed, patient examined, no change in status, stable for surgery.  I have reviewed the patient's chart and labs.  Questions were answered to the patient's satisfaction.     Hillis Range

## 2012-08-01 ENCOUNTER — Encounter (HOSPITAL_COMMUNITY): Payer: Self-pay | Admitting: Cardiology

## 2012-08-01 DIAGNOSIS — E119 Type 2 diabetes mellitus without complications: Secondary | ICD-10-CM | POA: Diagnosis not present

## 2012-08-01 DIAGNOSIS — I4891 Unspecified atrial fibrillation: Secondary | ICD-10-CM

## 2012-08-01 DIAGNOSIS — I10 Essential (primary) hypertension: Secondary | ICD-10-CM | POA: Diagnosis not present

## 2012-08-01 DIAGNOSIS — E876 Hypokalemia: Secondary | ICD-10-CM | POA: Diagnosis not present

## 2012-08-01 LAB — GLUCOSE, CAPILLARY: Glucose-Capillary: 86 mg/dL (ref 70–99)

## 2012-08-01 LAB — BASIC METABOLIC PANEL
CO2: 29 mEq/L (ref 19–32)
Chloride: 105 mEq/L (ref 96–112)
Sodium: 142 mEq/L (ref 135–145)

## 2012-08-01 MED ORDER — POTASSIUM CHLORIDE CRYS ER 20 MEQ PO TBCR
EXTENDED_RELEASE_TABLET | ORAL | Status: AC
  Filled 2012-08-01: qty 2

## 2012-08-01 MED ORDER — POTASSIUM CHLORIDE CRYS ER 20 MEQ PO TBCR: 40.0000 meq | EXTENDED_RELEASE_TABLET | Freq: Once | ORAL | Status: DC

## 2012-08-01 MED ORDER — POTASSIUM CHLORIDE CRYS ER 20 MEQ PO TBCR
40.0000 meq | EXTENDED_RELEASE_TABLET | Freq: Once | ORAL | Status: AC
  Administered 2012-08-01: 08:00:00 40 meq via ORAL

## 2012-08-01 NOTE — Discharge Summary (Signed)
Physician Discharge Summary  Patient ID: Hailey [name] MRN: [id number] DOB/AGE: 68-Nov-1947 68 y.o.  Admit date: 07/31/2012 Discharge date: 08/01/2012  Primary Discharge Diagnosis: Paroxysmal Atrial Fibrillation Secondary Discharge Diagnosis: 1. Hypokalemia 2. Hypertension 3.Diabetes 4. Bradycardia 5. S/P pacemaker in situ 6. CAD 7. DJD   Significant Diagnostic Studies: 07/31/2012 PROCEDURES:  1. Comprehensive electrophysiologic study.  2. Coronary sinus pacing and recording.  3. Three-dimensional mapping of atrial fibrillation  4. Ablation of atrial fibrillation  5. Intracardiac echocardiography.  6. Transseptal puncture of an intact septum.  7. Rotational Angiography with processing at an independent workstation  8. Arrhythmia induction with pacing with isuprel infusion  9. Dual chamber pacemaker interrogation and reprogramming    Consults: None  Hospital Course: Hailey [name] is a 67 y.o. female with a history of paroxysmal atrial fibrillation who  presented for EP study and radiofrequency ablation.She is followed normally by Dr. [name] with [facility]. The patient reports initially being diagnosed with atrial fibrillation after presenting with symptomatic palpitations and fatgiue. The patient reports increasing frequency and duration of atrial fibrillation since that time. The patient has failed medical therapy with Multaq. The patient therefore presents today for catheter ablation of atrial fibrillation.    On 07/31/2012 she had EP study with ablation of atrial fibrillation and dual chamber pacemaker interrogation by Dr. Hillis Range. She tolerated the procedure very well and had uneventful recovery. The morning of discharge, follow up labs revealed hypokalemia with 2.9. This was repleted this am. She will have follow up BMET with [facility] on Monday August 03, 2012. She will follow up with Dr. [name] post hospitalization. She will be seen by Dr. [name] in 3 months. She was seen and  examined by Dr. Diona Browner on day of discharge. Found to be stable to go home. Questions were answered for post procedure activity. No medication changes were made.    Discharge Exam: Blood pressure 103/38, pulse 66, temperature 97.9 F (36.6 C), temperature source Oral, resp. rate 18, height 4' (1.219 m), weight 162 lb 14.7 oz (73.9 kg), SpO2 97.00%. See progress note dated 08/01/2012   Lab Results  Component Value Date   WBC 6.7 07/24/2012   HGB 11.9* 07/24/2012   HCT 36.5 07/24/2012   MCV 83.9 07/24/2012   PLT 257.0 07/24/2012    Recent Labs Lab 08/01/12 0600  NA 142  K 2.9*  CL 105  CO2 29  BUN 9  CREATININE 0.83  CALCIUM 8.3*  GLUCOSE 84   Lab Results  Component Value Date   CKTOTAL 111 11/24/2010   CKMB 2.8 11/24/2010   TROPONINI <0.30 11/24/2010    Lab Results  Component Value Date   CHOL 94 11/24/2010   Lab Results  Component Value Date   HDL 53 11/24/2010   Lab Results  Component Value Date   LDLCALC 31 11/24/2010   Lab Results  Component Value Date   TRIG 51 11/24/2010   Lab Results  Component Value Date   CHOLHDL 1.8 11/24/2010   No results found for this basename: LDLDIRECT      Radiology: No results found.  WUJ:WJXBJ Rhythm rate of 60 bpm  FOLLOW UP PLANS AND APPOINTMENTS  Future Appointments Provider Department Dept Phone   11/04/2012 2:00 PM Hillis Range, MD [facility] Main Office [place]) [phone]       Medication List    ASK your doctor about these medications       ANTI-DIARRHEAL PO  Take 1 tablet by mouth 3 (three) times daily  as needed (diarrhea).     CALCIUM 600+D 600-400 MG-UNIT per tablet  Generic drug:  Calcium Carbonate-Vitamin D  Take 1 tablet by mouth daily.     diltiazem 120 MG 24 hr capsule  Commonly known as:  DILACOR XR  Take 120 mg by mouth daily.     EQ ALLERGY RELIEF PO  Take 1 tablet by mouth daily as needed (allergies).     hydrochlorothiazide 12.5 MG capsule  Commonly known as:  MICROZIDE  Take 12.5  mg by mouth daily.     methocarbamol 500 MG tablet  Commonly known as:  ROBAXIN  Take 250 mg by mouth 2 (two) times daily as needed (arthritis).     metoprolol 50 MG tablet  Commonly known as:  LOPRESSOR  Take 50 mg by mouth 2 (two) times daily.     MULTAQ 400 MG tablet  Generic drug:  dronedarone  Take 1 tablet by mouth Twice daily.     ONGLYZA 5 MG Tabs tablet  Generic drug:  saxagliptin HCl  Take 1 tablet by mouth daily.     pantoprazole 40 MG tablet  Commonly known as:  PROTONIX  Take 40 mg by mouth daily.     PRADAXA 150 MG Caps  Generic drug:  dabigatran  Take 1 tablet by mouth Twice daily.     propylthiouracil 50 MG tablet  Commonly known as:  PTU  Take 50-100 mg by mouth 2 (two) times daily. Take 2 tabs in the AM and 1 tab in the PM     valsartan 80 MG tablet  Commonly known as:  DIOVAN  Take 80 mg by mouth daily.     Vitamin D3 1000 UNITS Caps  Take 1 capsule by mouth daily.     WELCHOL 625 MG tablet  Generic drug:  colesevelam  Take 1,250 mg by mouth 2 (two) times daily with a meal. 2 tabs in AM and 2 tabs in PM           Follow-up Information   Follow up with [name], MD. (Our office will call you with the appt date and time.)    Contact information:   [address] [place] [address] [phone]       Follow up with Hillis Range, MD. (Our office will call you for appointment. If you do no hear from [place] by the end of the day on Monday, pleasse call our office.)    Contact information:   [address], [address] [place] [address] [phone]       Please follow up. (Lab work on Monday, August 03, 2012. Script provided. Can be done at [facility] lab)         Time spent with patient to include physician time:45 minutes Signed: Joni Reining 08/01/2012, 8:15 AM Co-Sign MD

## 2012-08-01 NOTE — Progress Notes (Signed)
Foley d/c'ed , bulb intact. Ambulated patient 500 ft with stand by assistant. No compliants

## 2012-08-01 NOTE — Discharge Summary (Signed)
See also my rounding note. 

## 2012-08-01 NOTE — Progress Notes (Signed)
   Primary cardiologist: Dr. [name] Electrophysiologist: Dr. Hillis Range.  SUBJECTIVE: No complaints. Feels well, ready to go home.  LABS: Basic Metabolic Panel:  Recent Labs  29/56/21 0600  NA 142  K 2.9*  CL 105  CO2 29  GLUCOSE 84  BUN 9  CREATININE 0.83  CALCIUM 8.3*    PHYSICAL EXAM BP 103/38  Pulse 66  Temp(Src) 97.9 F (36.6 C) (Oral)  Resp 18  Ht 4' (1.219 m)  Wt 162 lb 14.7 oz (73.9 kg)  BMI 49.73 kg/m2  SpO2 97% General: Well developed, well nourished, in no acute distress Head: Eyes PERRLA, No xanthomas.   Normal cephalic and atramatic  Lungs: Clear bilaterally to auscultation and percussion. Heart: HRRR S1 S2, No MRG .  Pulses are 2+ & equal.            No carotid bruit. No JVD.  No abdominal bruits. No femoral bruits. Abdomen: Bowel sounds are positive, abdomen soft and non-tender without masses or                  Hernia's noted. Msk:  Back normal, normal gait. Normal strength and tone for age. Extremities: No clubbing, cyanosis or edema, right groin is clean and dry, .  DP +1 Neuro: Alert and oriented X 3. Psych:  Good affect, responds appropriately  TELEMETRY: Reviewed telemetry pt in Paced rhythm  60 bpm,  ASSESSMENT AND PLAN:  1. Paroxysmal Atrial fibrillation:  S/P ablation procedure per Dr. [name] on 2.28.2014. Doing well and having no complications of bleeding or pain. She will follow up with Dr.Allred in 3 months as previously scheduled. Continue Pradaxa, cardiazem, and metoprolol. 2. Hypokalemia: Potassium this am is 2.9. Replacement provided. Will need follow up BMET on Monday for reassessment.  Can will be done at the [facility] office. 3. Diabetes 4. Hypertension: Currently well controlled. No changes in medications. 5. CAD: Followed by [facility]. Follow-up appointment with [facility] will need to be planned. Spoke with Arlys John with [facility] who will make the appointment for her.   Bettey Mare. Lyman Bishop NP [facility] 08/01/2012, 7:42  AM  Attending note:  Patient seen and examined this morning. Reviewed hospital stay, patient status post atrial fibrillation ablation by Dr. [name] and doing well clinically in sinus rhythm now. She has been hypokalemic, repleted. Ms. Lyman Bishop NP reviewed patient's discharge with the on-call [facility] physician so that routine cardiology followup could be arranged. She will need a BMET on Monday to determine if any further potassium repletion as needed. Otherwise to continue her present medications. Followup with Dr. [name] is being arranged in 3 months.  Jonelle Sidle, M.D., F.A.C.C.

## 2012-08-01 NOTE — Progress Notes (Signed)
Patient voided 200cc urine

## 2012-08-05 DIAGNOSIS — Z79899 Other long term (current) drug therapy: Secondary | ICD-10-CM | POA: Diagnosis not present

## 2012-08-06 ENCOUNTER — Other Ambulatory Visit: Payer: Self-pay

## 2012-08-06 DIAGNOSIS — D235 Other benign neoplasm of skin of trunk: Secondary | ICD-10-CM | POA: Diagnosis not present

## 2012-08-06 DIAGNOSIS — D485 Neoplasm of uncertain behavior of skin: Secondary | ICD-10-CM | POA: Diagnosis not present

## 2012-08-06 DIAGNOSIS — L905 Scar conditions and fibrosis of skin: Secondary | ICD-10-CM | POA: Diagnosis not present

## 2012-08-13 ENCOUNTER — Telehealth: Payer: Self-pay | Admitting: Internal Medicine

## 2012-08-13 NOTE — Telephone Encounter (Signed)
She is going to continue Tylenol for now as it is better

## 2012-08-13 NOTE — Telephone Encounter (Signed)
New problem    C/O  discomfort in right leg since yesterday . Took tylenol x 2 - 500 mg  every 4-6 hours since yesterday . Woke up this am still hurting took another 2 - 500 mg tylenol.

## 2012-08-13 NOTE — Telephone Encounter (Signed)
Leg hurts down in the bone.  She has taken Tylenol and it helps.  The groin area

## 2012-08-25 DIAGNOSIS — M545 Low back pain, unspecified: Secondary | ICD-10-CM | POA: Diagnosis not present

## 2012-08-25 DIAGNOSIS — R21 Rash and other nonspecific skin eruption: Secondary | ICD-10-CM | POA: Diagnosis not present

## 2012-08-30 ENCOUNTER — Emergency Department (HOSPITAL_COMMUNITY): Payer: Medicare Other

## 2012-08-30 ENCOUNTER — Emergency Department (HOSPITAL_COMMUNITY)
Admission: EM | Admit: 2012-08-30 | Discharge: 2012-08-30 | Disposition: A | Discharge status: Home / Self Care | Payer: Medicare Other | Attending: Emergency Medicine | Admitting: Emergency Medicine

## 2012-08-30 ENCOUNTER — Encounter (HOSPITAL_COMMUNITY): Payer: Self-pay | Admitting: *Deleted

## 2012-08-30 DIAGNOSIS — Z8619 Personal history of other infectious and parasitic diseases: Secondary | ICD-10-CM | POA: Insufficient documentation

## 2012-08-30 DIAGNOSIS — I2510 Atherosclerotic heart disease of native coronary artery without angina pectoris: Secondary | ICD-10-CM | POA: Insufficient documentation

## 2012-08-30 DIAGNOSIS — Z8679 Personal history of other diseases of the circulatory system: Secondary | ICD-10-CM | POA: Insufficient documentation

## 2012-08-30 DIAGNOSIS — Z79899 Other long term (current) drug therapy: Secondary | ICD-10-CM | POA: Insufficient documentation

## 2012-08-30 DIAGNOSIS — M545 Low back pain, unspecified: Secondary | ICD-10-CM

## 2012-08-30 DIAGNOSIS — I10 Essential (primary) hypertension: Secondary | ICD-10-CM | POA: Diagnosis not present

## 2012-08-30 DIAGNOSIS — M51379 Other intervertebral disc degeneration, lumbosacral region without mention of lumbar back pain or lower extremity pain: Secondary | ICD-10-CM | POA: Insufficient documentation

## 2012-08-30 DIAGNOSIS — I4891 Unspecified atrial fibrillation: Secondary | ICD-10-CM | POA: Diagnosis not present

## 2012-08-30 DIAGNOSIS — M479 Spondylosis, unspecified: Secondary | ICD-10-CM

## 2012-08-30 DIAGNOSIS — E119 Type 2 diabetes mellitus without complications: Secondary | ICD-10-CM | POA: Diagnosis not present

## 2012-08-30 DIAGNOSIS — M5137 Other intervertebral disc degeneration, lumbosacral region: Secondary | ICD-10-CM | POA: Diagnosis not present

## 2012-08-30 MED ORDER — PREDNISONE 20 MG PO TABS
60.0000 mg | ORAL_TABLET | Freq: Once | ORAL | Status: AC
  Administered 2012-08-30: 60 mg via ORAL
  Filled 2012-08-30: qty 3

## 2012-08-30 MED ORDER — PREDNISONE 20 MG PO TABS: 20.0000 mg | ORAL_TABLET | Freq: Two times a day (BID) | ORAL | Status: DC

## 2012-08-30 MED ORDER — ONDANSETRON HCL 8 MG PO TABS: 8.0000 mg | ORAL_TABLET | Freq: Three times a day (TID) | ORAL | Status: DC | PRN

## 2012-08-30 MED ORDER — ONDANSETRON 4 MG PO TBDP
8.0000 mg | ORAL_TABLET | Freq: Once | ORAL | Status: AC
  Administered 2012-08-30: 8 mg via ORAL
  Filled 2012-08-30: qty 2

## 2012-08-30 MED ORDER — HYDROCODONE-ACETAMINOPHEN 5-325 MG PO TABS
1.0000 | ORAL_TABLET | Freq: Once | ORAL | Status: AC
  Administered 2012-08-30: 1 via ORAL
  Filled 2012-08-30: qty 1

## 2012-08-30 MED ORDER — HYDROCODONE-ACETAMINOPHEN 5-325 MG PO TABS: 1.0000 | ORAL_TABLET | ORAL | Status: DC | PRN

## 2012-08-30 NOTE — ED Notes (Signed)
Pt has had lower back pain r/t osteoarthritis for 3 weeks.  Pt visited the doctor on Tuesday and received Methocarbam and is supposed to start therapy next Tuesday.  This morning pt went to use the restroom and couldn't lay down or sit down because her back felt "like it was locked in place".

## 2012-08-30 NOTE — ED Provider Notes (Signed)
History     CSN: [id number]  Arrival date & time 08/30/12  0503   First MD Initiated Contact with Patient 08/30/12 [phone]      Chief Complaint  Patient presents with  . Back Pain    (Consider location/radiation/quality/duration/timing/severity/associated sxs/prior treatment) HPI Comments: Hailey [name] is a 67 y.o. female who presents for ongoing back pain that is worsening over the last week. It hurts mostly when she is up and moving. She feels like her back is locked, at times. She denies change in bowel or urinary habits. She denies fever, chills, nausea, vomiting, weakness, or dizziness. Her back pain is right lower. There is no radiation of the pain. The pain is dull. She has been seen by her PCP and was advised treatment with muscle relaxer and Tylenol. She has tried this without relief. She has been referred for physical therapy, to start next week. There are no other modifying factors.  Patient is a 67 y.o. female presenting with back pain. The history is provided by the patient.  Back Pain   Past Medical History  Diagnosis Date  . Hypertension   . Paroxysmal atrial fibrillation   . Diabetes mellitus   . Allergic rhinitis   . DJD (degenerative joint disease)   . Bradycardia     s/p PPM by Dr [name]  . CAD (coronary artery disease)     70% RCA stenosis, treated medically  . Polio     as a child    Past Surgical History  Procedure Laterality Date  . Cholecystectomy    . Total abdominal hysterectomy    . Pacemaker insertion  12/03/10    SJM Accent DR RF implanted by Dr [name]  . Arm surgery d/t fx    . Tee without cardioversion N/A 07/30/2012    Procedure: TRANSESOPHAGEAL ECHOCARDIOGRAM (TEE);  Surgeon: [name], MD;  Location: [facility] ENDOSCOPY;  Service: Cardiovascular;  Laterality: N/A;  h&p in file-[facility]  . Atrial ablation surgery  07/31/2012    Family History  Problem Relation Age of Onset  . Heart disease Sister   . Breast cancer Sister   . Breast cancer  Sister     History  Substance Use Topics  . Smoking status: Never Smoker   . Smokeless tobacco: Not on file  . Alcohol Use: No    OB History   Grav Para Term Preterm Abortions TAB SAB Ect Mult Living                  Review of Systems  Musculoskeletal: Positive for back pain.  All other systems reviewed and are negative.    Allergies  Codeine; Demerol; and Ketorolac  Home Medications   Current Outpatient Rx  Name  Route  Sig  Dispense  Refill  . acetaminophen (TYLENOL) 500 MG tablet   Oral   Take 1,000 mg by mouth every 6 (six) hours as needed for pain.         . Calcium Carbonate-Vitamin D (CALCIUM 600+D) 600-400 MG-UNIT per tablet   Oral   Take 1 tablet by mouth daily.           . Cholecalciferol (VITAMIN D3) 1000 UNITS CAPS   Oral   Take 1 capsule by mouth daily.           . colesevelam (WELCHOL) 625 MG tablet   Oral   Take 1,250 mg by mouth 2 (two) times daily with a meal.          .  dabigatran (PRADAXA) 150 MG CAPS   Oral   Take 150 mg by mouth every 12 (twelve) hours.         Marland Kitchen diltiazem (DILACOR XR) 120 MG 24 hr capsule   Oral   Take 120 mg by mouth daily.         Marland Kitchen dronedarone (MULTAQ) 400 MG tablet   Oral   Take 400 mg by mouth 2 (two) times daily with a meal.         . hydrochlorothiazide (MICROZIDE) 12.5 MG capsule   Oral   Take 12.5 mg by mouth daily.          Marland Kitchen loperamide (IMODIUM) 2 MG capsule   Oral   Take 2 mg by mouth every 6 (six) hours as needed for diarrhea or loose stools.         . methocarbamol (ROBAXIN) 500 MG tablet   Oral   Take 500 mg by mouth 2 (two) times daily as needed (muscle pain).          . metoprolol (LOPRESSOR) 50 MG tablet   Oral   Take 50 mg by mouth 2 (two) times daily.          . pantoprazole (PROTONIX) 40 MG tablet   Oral   Take 40 mg by mouth daily.          Marland Kitchen propylthiouracil (PTU) 50 MG tablet   Oral   Take 50-100 mg by mouth See admin instructions. Take 2 tabs in the AM  and 1 tab in the PM         . saxagliptin HCl (ONGLYZA) 5 MG TABS tablet   Oral   Take 5 mg by mouth daily.         . valsartan (DIOVAN) 80 MG tablet   Oral   Take 80 mg by mouth daily.         Marland Kitchen HYDROcodone-acetaminophen (NORCO) 5-325 MG per tablet   Oral   Take 1 tablet by mouth every 4 (four) hours as needed for pain.   20 tablet   0   . ondansetron (ZOFRAN) 8 MG tablet   Oral   Take 1 tablet (8 mg total) by mouth every 8 (eight) hours as needed for nausea.   20 tablet   0   . predniSONE (DELTASONE) 20 MG tablet   Oral   Take 1 tablet (20 mg total) by mouth 2 (two) times daily.   10 tablet   0     BP 132/75  Pulse 72  Temp(Src) 98.2 F (36.8 C) (Oral)  Resp 16  SpO2 97%  Physical Exam  Nursing note and vitals reviewed. Constitutional: She is oriented to person, place, and time. She appears well-developed and well-nourished.  HENT:  Head: Normocephalic and atraumatic.  Eyes: Conjunctivae and EOM are normal. Pupils are equal, round, and reactive to light.  Neck: Normal range of motion and phonation normal. Neck supple.  Cardiovascular: Normal rate, regular rhythm and intact distal pulses.   Pulmonary/Chest: Effort normal and breath sounds normal. She exhibits no tenderness.  Abdominal: Soft. She exhibits no distension. There is no tenderness. There is no guarding.  Musculoskeletal: Normal range of motion.  Mild right lumbar tenderness, with decreased motion of the lumbar spine secondary to pain. No evident deformity of the back.  Neurological: She is alert and oriented to person, place, and time. She has normal strength. She exhibits normal muscle tone.  Skin: Skin is warm and dry.  Psychiatric:  She has a normal mood and affect. Her behavior is normal. Judgment and thought content normal.    ED Course  Procedures (including critical care time)   Medications  ondansetron (ZOFRAN-ODT) disintegrating tablet 8 mg (8 mg Oral Given 08/30/12 0759)   HYDROcodone-acetaminophen (NORCO/VICODIN) 5-325 MG per tablet 1 tablet (1 tablet Oral Given 08/30/12 0803)  predniSONE (DELTASONE) tablet 60 mg (60 mg Oral Given 08/30/12 0801)   Reevaluation: 08:40- she feels better.    Dg Lumbar Spine Complete  08/30/2012  *RADIOLOGY REPORT*  Clinical Data: Low back pain  LUMBAR SPINE - COMPLETE 4+ VIEW  Comparison: 04/10/2012  Findings: Normal alignment without compression fracture, wedge shaped deformity or focal kyphosis.  Mild diffuse degenerative changes and spondylosis, most pronounced at L5 S1.  No pars defects.  Nonobstructive bowel gas pattern.  Prior cholecystectomy evident.  IMPRESSION: Degenerative changes.  No acute finding.   Original Report Authenticated By: Judie Petit. Shick, M.D.    Nursing Notes Reviewed/ Care Coordinated, and agree without changes. Applicable Imaging Reviewed. Radiologic imaging report reviewed and images by  radiography   - viewed, by me. Interpretation of Laboratory Data incorporated into ED treatment  1. Back pain, lumbosacral   2. Degenerative joint disease of low back       MDM  Evaluation is consistent with muscular low back pain. Doubt discitis, HNP, cauda equina syndrome, urinary tract or serious bacterial infection. She is stable for discharge with outpatient management     Plan: Home Medications- Vicodin, Prednisone, Zofran; Home Treatments- rest, heat; Recommended follow up- PCP as scheduled. Start PT in 3 days as sched.      Flint Melter, MD 08/30/12 [phone]

## 2012-09-01 ENCOUNTER — Ambulatory Visit: Payer: Medicare Other | Attending: Internal Medicine | Admitting: Physical Therapy

## 2012-09-01 DIAGNOSIS — M545 Low back pain, unspecified: Secondary | ICD-10-CM | POA: Insufficient documentation

## 2012-09-01 DIAGNOSIS — IMO0001 Reserved for inherently not codable concepts without codable children: Secondary | ICD-10-CM | POA: Insufficient documentation

## 2012-09-01 DIAGNOSIS — R5381 Other malaise: Secondary | ICD-10-CM | POA: Insufficient documentation

## 2012-09-01 DIAGNOSIS — M2560 Stiffness of unspecified joint, not elsewhere classified: Secondary | ICD-10-CM | POA: Diagnosis not present

## 2012-09-01 IMAGING — CR DG CHEST 1V PORT
1 series · 1 of 1 positions shown · non-contrast
Comparison: 11/23/2010

CLINICAL DATA: Shortness of breath

PORTABLE CHEST - 1 VIEW

[AP]
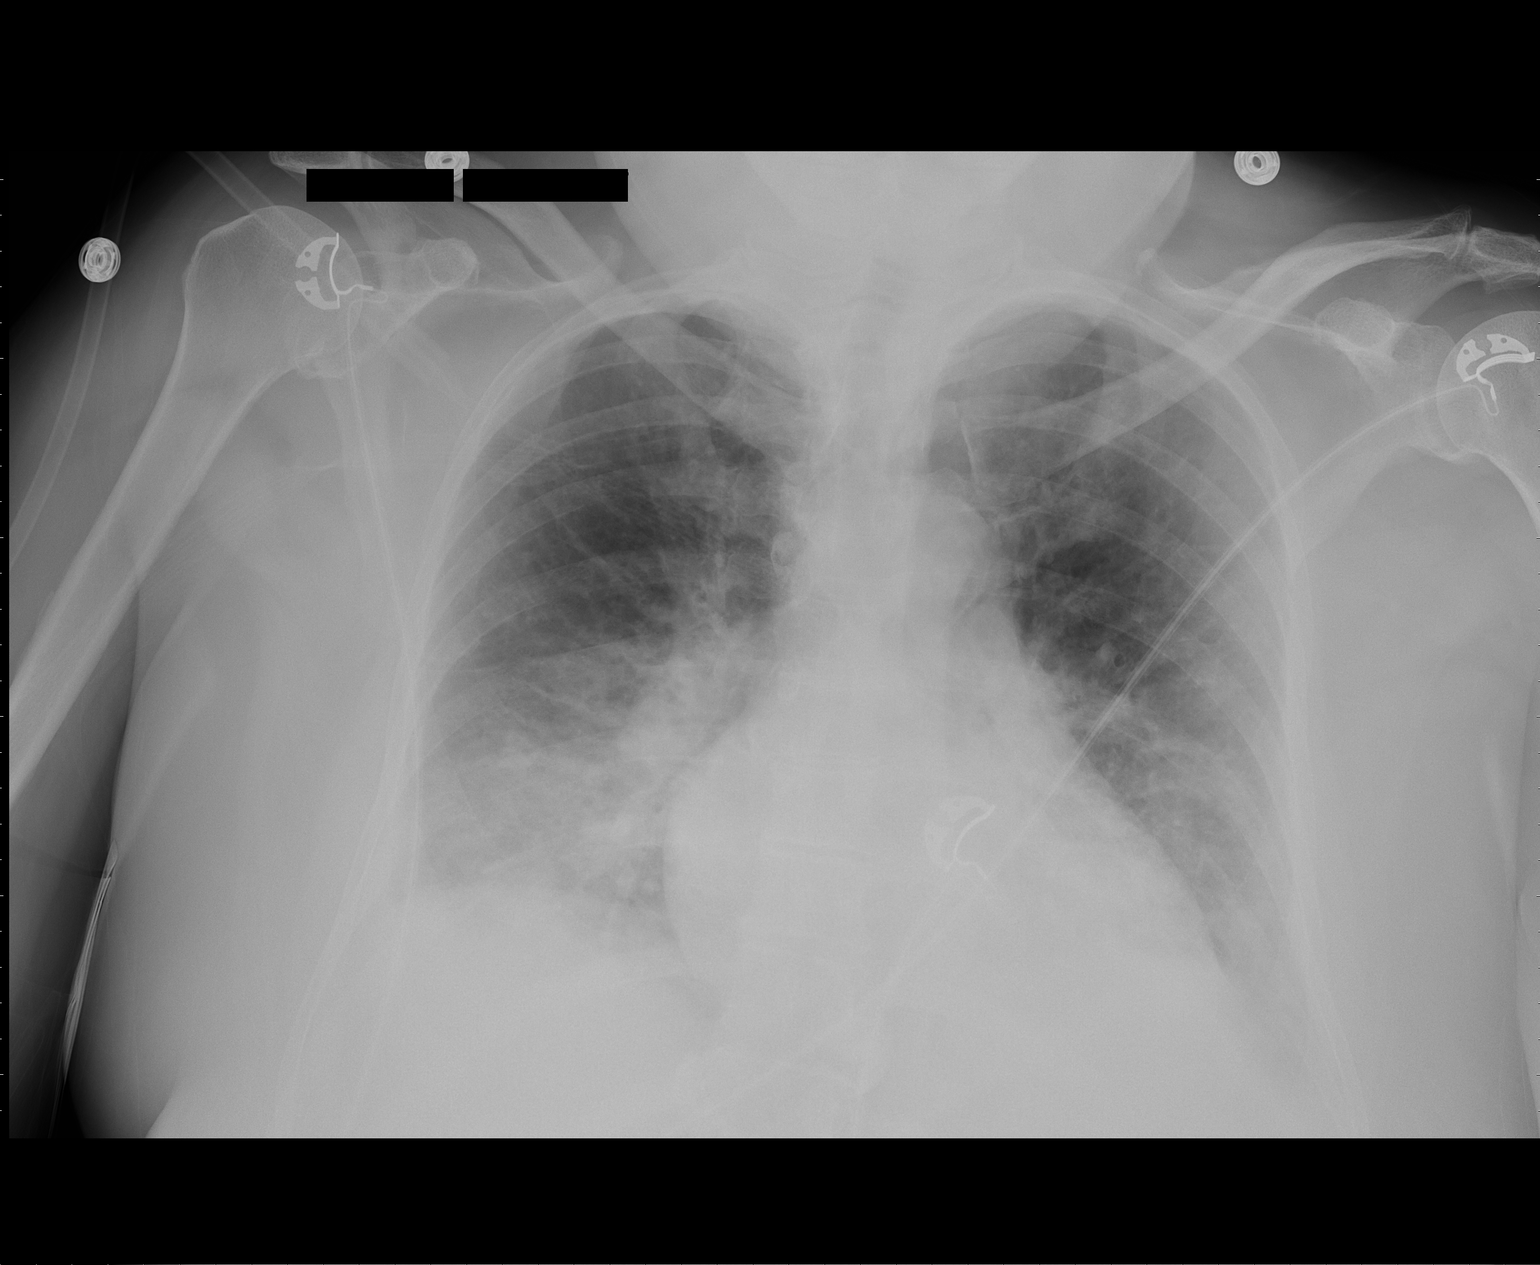

[1 of 1 positions shown; findings below may reference images not displayed]

FINDINGS: Lung volumes are low. The cardiopericardial silhouette is
enlarged.  Bibasilar airspace disease has progressed in the
interval. Telemetry leads overlie the chest.
IMPRESSION: Worsening bibasilar airspace disease.

## 2012-09-03 ENCOUNTER — Ambulatory Visit: Payer: Medicare Other | Admitting: Physical Therapy

## 2012-09-08 ENCOUNTER — Ambulatory Visit: Payer: Medicare Other | Admitting: Physical Therapy

## 2012-09-10 ENCOUNTER — Ambulatory Visit: Payer: Medicare Other | Admitting: Physical Therapy

## 2012-09-15 ENCOUNTER — Ambulatory Visit: Payer: Medicare Other | Admitting: *Deleted

## 2012-09-17 ENCOUNTER — Encounter: Payer: Medicare Other | Admitting: Physical Therapy

## 2012-09-17 DIAGNOSIS — N951 Menopausal and female climacteric states: Secondary | ICD-10-CM | POA: Diagnosis not present

## 2012-09-17 DIAGNOSIS — Z1231 Encounter for screening mammogram for malignant neoplasm of breast: Secondary | ICD-10-CM | POA: Diagnosis not present

## 2012-09-17 DIAGNOSIS — Z01419 Encounter for gynecological examination (general) (routine) without abnormal findings: Secondary | ICD-10-CM | POA: Diagnosis not present

## 2012-09-17 DIAGNOSIS — M810 Age-related osteoporosis without current pathological fracture: Secondary | ICD-10-CM | POA: Diagnosis not present

## 2012-09-17 DIAGNOSIS — M549 Dorsalgia, unspecified: Secondary | ICD-10-CM | POA: Diagnosis not present

## 2012-09-17 DIAGNOSIS — Z1212 Encounter for screening for malignant neoplasm of rectum: Secondary | ICD-10-CM | POA: Diagnosis not present

## 2012-09-22 ENCOUNTER — Ambulatory Visit: Payer: Medicare Other | Admitting: Physical Therapy

## 2012-09-24 ENCOUNTER — Ambulatory Visit: Payer: Medicare Other | Admitting: Physical Therapy

## 2012-09-26 DIAGNOSIS — I4891 Unspecified atrial fibrillation: Secondary | ICD-10-CM | POA: Diagnosis not present

## 2012-09-26 LAB — PACEMAKER DEVICE OBSERVATION

## 2012-09-29 ENCOUNTER — Ambulatory Visit: Payer: Medicare Other | Admitting: Physical Therapy

## 2012-10-01 ENCOUNTER — Ambulatory Visit: Payer: Medicare Other | Attending: Internal Medicine | Admitting: Physical Therapy

## 2012-10-01 DIAGNOSIS — IMO0001 Reserved for inherently not codable concepts without codable children: Secondary | ICD-10-CM | POA: Diagnosis not present

## 2012-10-01 DIAGNOSIS — M545 Low back pain, unspecified: Secondary | ICD-10-CM | POA: Diagnosis not present

## 2012-10-01 DIAGNOSIS — R5381 Other malaise: Secondary | ICD-10-CM | POA: Insufficient documentation

## 2012-10-01 DIAGNOSIS — M2560 Stiffness of unspecified joint, not elsewhere classified: Secondary | ICD-10-CM | POA: Diagnosis not present

## 2012-10-06 ENCOUNTER — Ambulatory Visit: Payer: Medicare Other | Admitting: Physical Therapy

## 2012-10-06 DIAGNOSIS — R5381 Other malaise: Secondary | ICD-10-CM | POA: Diagnosis not present

## 2012-10-06 DIAGNOSIS — M2560 Stiffness of unspecified joint, not elsewhere classified: Secondary | ICD-10-CM | POA: Diagnosis not present

## 2012-10-06 DIAGNOSIS — M545 Low back pain, unspecified: Secondary | ICD-10-CM | POA: Diagnosis not present

## 2012-10-06 DIAGNOSIS — IMO0001 Reserved for inherently not codable concepts without codable children: Secondary | ICD-10-CM | POA: Diagnosis not present

## 2012-10-07 DIAGNOSIS — M461 Sacroiliitis, not elsewhere classified: Secondary | ICD-10-CM | POA: Diagnosis not present

## 2012-10-07 DIAGNOSIS — M25559 Pain in unspecified hip: Secondary | ICD-10-CM | POA: Diagnosis not present

## 2012-10-07 DIAGNOSIS — M169 Osteoarthritis of hip, unspecified: Secondary | ICD-10-CM | POA: Diagnosis not present

## 2012-10-07 DIAGNOSIS — IMO0001 Reserved for inherently not codable concepts without codable children: Secondary | ICD-10-CM | POA: Diagnosis not present

## 2012-10-07 DIAGNOSIS — G894 Chronic pain syndrome: Secondary | ICD-10-CM | POA: Diagnosis not present

## 2012-10-08 ENCOUNTER — Ambulatory Visit: Payer: Medicare Other | Admitting: Physical Therapy

## 2012-10-08 DIAGNOSIS — R5381 Other malaise: Secondary | ICD-10-CM | POA: Diagnosis not present

## 2012-10-08 DIAGNOSIS — M545 Low back pain, unspecified: Secondary | ICD-10-CM | POA: Diagnosis not present

## 2012-10-08 DIAGNOSIS — IMO0001 Reserved for inherently not codable concepts without codable children: Secondary | ICD-10-CM | POA: Diagnosis not present

## 2012-10-08 DIAGNOSIS — M2560 Stiffness of unspecified joint, not elsewhere classified: Secondary | ICD-10-CM | POA: Diagnosis not present

## 2012-10-13 ENCOUNTER — Ambulatory Visit: Payer: Medicare Other | Admitting: Physical Therapy

## 2012-10-13 DIAGNOSIS — M545 Low back pain, unspecified: Secondary | ICD-10-CM | POA: Diagnosis not present

## 2012-10-13 DIAGNOSIS — IMO0001 Reserved for inherently not codable concepts without codable children: Secondary | ICD-10-CM | POA: Diagnosis not present

## 2012-10-13 DIAGNOSIS — M2560 Stiffness of unspecified joint, not elsewhere classified: Secondary | ICD-10-CM | POA: Diagnosis not present

## 2012-10-13 DIAGNOSIS — R5381 Other malaise: Secondary | ICD-10-CM | POA: Diagnosis not present

## 2012-10-14 ENCOUNTER — Other Ambulatory Visit (HOSPITAL_COMMUNITY): Payer: Self-pay | Admitting: Pain Medicine

## 2012-10-14 DIAGNOSIS — M545 Low back pain, unspecified: Secondary | ICD-10-CM

## 2012-10-19 DIAGNOSIS — H6120 Impacted cerumen, unspecified ear: Secondary | ICD-10-CM | POA: Diagnosis not present

## 2012-10-20 DIAGNOSIS — H6120 Impacted cerumen, unspecified ear: Secondary | ICD-10-CM | POA: Diagnosis not present

## 2012-10-22 ENCOUNTER — Ambulatory Visit (HOSPITAL_COMMUNITY): Admission: RE | Admit: 2012-10-22 | Payer: Medicare Other | Source: Ambulatory Visit

## 2012-10-30 DIAGNOSIS — M5137 Other intervertebral disc degeneration, lumbosacral region: Secondary | ICD-10-CM | POA: Diagnosis not present

## 2012-10-30 DIAGNOSIS — IMO0001 Reserved for inherently not codable concepts without codable children: Secondary | ICD-10-CM | POA: Diagnosis not present

## 2012-10-30 DIAGNOSIS — G894 Chronic pain syndrome: Secondary | ICD-10-CM | POA: Diagnosis not present

## 2012-10-30 DIAGNOSIS — M461 Sacroiliitis, not elsewhere classified: Secondary | ICD-10-CM | POA: Diagnosis not present

## 2012-11-03 DIAGNOSIS — M461 Sacroiliitis, not elsewhere classified: Secondary | ICD-10-CM | POA: Diagnosis not present

## 2012-11-04 ENCOUNTER — Encounter: Payer: Self-pay | Admitting: Internal Medicine

## 2012-11-04 ENCOUNTER — Ambulatory Visit (INDEPENDENT_AMBULATORY_CARE_PROVIDER_SITE_OTHER): Payer: Medicare Other | Admitting: Internal Medicine

## 2012-11-04 VITALS — BP 119/69 | HR 60 | Ht 59.0 in | Wt 158.8 lb

## 2012-11-04 DIAGNOSIS — I4891 Unspecified atrial fibrillation: Secondary | ICD-10-CM | POA: Diagnosis not present

## 2012-11-04 DIAGNOSIS — I498 Other specified cardiac arrhythmias: Secondary | ICD-10-CM | POA: Diagnosis not present

## 2012-11-04 DIAGNOSIS — R001 Bradycardia, unspecified: Secondary | ICD-10-CM

## 2012-11-04 LAB — PACEMAKER DEVICE OBSERVATION
AL THRESHOLD: 0.625 V
ATRIAL PACING PM: 89
BAMS-0003: 70 {beats}/min
DEVICE MODEL PM: 782791
RV LEAD AMPLITUDE: 7.3 mv
RV LEAD THRESHOLD: 0.75 V

## 2012-11-04 NOTE — Progress Notes (Signed)
PCP: [name], MD Primary Cardiologist:  Dr [name]  Hailey [name] is a 67 y.o. female who presents today for routine electrophysiology followup.  Since her afib ablation, the patient reports doing very well.  Her energy is significantly improved.  She is unaware of any afib.  She denies procedure related complications.  Today, she denies symptoms of palpitations, chest pain, shortness of breath,  lower extremity edema, dizziness, presyncope, or syncope.  The patient is otherwise without complaint today.   Past Medical History  Diagnosis Date  . Hypertension   . Paroxysmal atrial fibrillation   . Diabetes mellitus   . Allergic rhinitis   . DJD (degenerative joint disease)   . Bradycardia     s/p PPM by Dr [name]  . CAD (coronary artery disease)     70% RCA stenosis, treated medically  . Polio     as a child   Past Surgical History  Procedure Laterality Date  . Cholecystectomy    . Total abdominal hysterectomy    . Pacemaker insertion  12/03/10    SJM Accent DR RF implanted by Dr [name]  . Arm surgery d/t fx    . Tee without cardioversion N/A 07/30/2012    Procedure: TRANSESOPHAGEAL ECHOCARDIOGRAM (TEE);  Surgeon: [name], MD;  Location: [facility] ENDOSCOPY;  Service: Cardiovascular;  Laterality: N/A;  h&p in file-[facility]  . Atrial fibrillation ablation  07/31/2012    PVI by Dr [name]    Current Outpatient Prescriptions  Medication Sig Dispense Refill  . acetaminophen (TYLENOL) 500 MG tablet Take 1,000 mg by mouth every 6 (six) hours as needed for pain.      . Calcium Carbonate-Vitamin D (CALCIUM 600+D) 600-400 MG-UNIT per tablet Take 1 tablet by mouth daily.        . Cholecalciferol (VITAMIN D3) 1000 UNITS CAPS Take 1 capsule by mouth daily.        . colesevelam (WELCHOL) 625 MG tablet Take 1,250 mg by mouth 2 (two) times daily with a meal.       . dabigatran (PRADAXA) 150 MG CAPS Take 150 mg by mouth every 12 (twelve) hours.      Marland Kitchen diltiazem (DILACOR XR) 120 MG 24 hr capsule  Take 120 mg by mouth daily.      Marland Kitchen dronedarone (MULTAQ) 400 MG tablet Take 400 mg by mouth 2 (two) times daily with a meal.      . hydrochlorothiazide (MICROZIDE) 12.5 MG capsule Take 12.5 mg by mouth daily.       Marland Kitchen HYDROcodone-acetaminophen (NORCO) 5-325 MG per tablet Take 1 tablet by mouth every 4 (four) hours as needed for pain.  20 tablet  0  . loperamide (IMODIUM) 2 MG capsule Take 2 mg by mouth every 6 (six) hours as needed for diarrhea or loose stools.      . metoprolol (LOPRESSOR) 50 MG tablet Take 50 mg by mouth 2 (two) times daily.       . ondansetron (ZOFRAN) 8 MG tablet Take 1 tablet (8 mg total) by mouth every 8 (eight) hours as needed for nausea.  20 tablet  0  . pantoprazole (PROTONIX) 40 MG tablet Take 40 mg by mouth daily.       . predniSONE (DELTASONE) 20 MG tablet Take 1 tablet (20 mg total) by mouth 2 (two) times daily.  10 tablet  0  . propylthiouracil (PTU) 50 MG tablet Take 50-100 mg by mouth See admin instructions. Take 2 tabs in the AM and 1 tab  in the PM      . saxagliptin HCl (ONGLYZA) 5 MG TABS tablet Take 5 mg by mouth daily.      . traMADol (ULTRAM) 50 MG tablet Take 25 mg by mouth every 6 (six) hours as needed.       . valsartan (DIOVAN) 80 MG tablet Take 80 mg by mouth daily.       No current facility-administered medications for this visit.    Physical Exam: Filed Vitals:   11/04/12 1358  BP: 119/69  Pulse: 60  Height: 4\' 11"  (1.499 m)  Weight: 158 lb 12.8 oz (72.031 kg)    GEN- The patient is well appearing, alert and oriented x 3 today.   Head- normocephalic, atraumatic Eyes-  Sclera clear, conjunctiva pink Ears- hearing intact Oropharynx- clear Lungs- Clear to ausculation bilaterally, normal work of breathing Heart- Regular rate and rhythm, no murmurs, rubs or gallops, PMI not laterally displaced GI- soft, NT, ND, + BS Extremities- no clubbing, cyanosis, or edema  ekg today reveals atrial pacing 65 bpm, PR 204, nonspecific ST/T changes, Qtc  509  Assessment and Plan:  1. afib Much improved s/p ablation Interrogation today reveals afib burden is 8% and continues to improve. No changes today  2. Tachy/brady Normal pacemaker function See Arita Miss Art report No changes today   Return in 3 months

## 2012-11-04 NOTE — Patient Instructions (Addendum)
Your physician recommends that you schedule a follow-up appointment in 3 months with Dr Allred    

## 2012-11-06 DIAGNOSIS — L905 Scar conditions and fibrosis of skin: Secondary | ICD-10-CM | POA: Diagnosis not present

## 2012-11-06 DIAGNOSIS — D235 Other benign neoplasm of skin of trunk: Secondary | ICD-10-CM | POA: Diagnosis not present

## 2012-11-09 ENCOUNTER — Encounter (HOSPITAL_COMMUNITY): Payer: Self-pay

## 2012-11-09 ENCOUNTER — Ambulatory Visit (HOSPITAL_COMMUNITY)
Admission: RE | Admit: 2012-11-09 | Discharge: 2012-11-09 | Disposition: A | Discharge status: Home / Self Care | Payer: Medicare Other | Source: Ambulatory Visit | Attending: Pain Medicine | Admitting: Pain Medicine

## 2012-11-09 DIAGNOSIS — Q766 Other congenital malformations of ribs: Secondary | ICD-10-CM | POA: Diagnosis not present

## 2012-11-09 DIAGNOSIS — M4120 Other idiopathic scoliosis, site unspecified: Secondary | ICD-10-CM | POA: Diagnosis not present

## 2012-11-09 DIAGNOSIS — M47817 Spondylosis without myelopathy or radiculopathy, lumbosacral region: Secondary | ICD-10-CM | POA: Diagnosis not present

## 2012-11-09 DIAGNOSIS — M545 Low back pain, unspecified: Secondary | ICD-10-CM

## 2012-11-09 DIAGNOSIS — Q767 Congenital malformation of sternum: Secondary | ICD-10-CM | POA: Insufficient documentation

## 2012-11-09 DIAGNOSIS — M5430 Sciatica, unspecified side: Secondary | ICD-10-CM | POA: Insufficient documentation

## 2012-11-11 DIAGNOSIS — E0789 Other specified disorders of thyroid: Secondary | ICD-10-CM | POA: Diagnosis not present

## 2012-11-11 DIAGNOSIS — E119 Type 2 diabetes mellitus without complications: Secondary | ICD-10-CM | POA: Diagnosis not present

## 2012-11-18 DIAGNOSIS — I10 Essential (primary) hypertension: Secondary | ICD-10-CM | POA: Diagnosis not present

## 2012-11-18 DIAGNOSIS — I4891 Unspecified atrial fibrillation: Secondary | ICD-10-CM | POA: Diagnosis not present

## 2012-11-18 DIAGNOSIS — E119 Type 2 diabetes mellitus without complications: Secondary | ICD-10-CM | POA: Diagnosis not present

## 2012-11-26 DIAGNOSIS — M5137 Other intervertebral disc degeneration, lumbosacral region: Secondary | ICD-10-CM | POA: Diagnosis not present

## 2012-11-26 DIAGNOSIS — M47817 Spondylosis without myelopathy or radiculopathy, lumbosacral region: Secondary | ICD-10-CM | POA: Diagnosis not present

## 2012-11-26 DIAGNOSIS — M461 Sacroiliitis, not elsewhere classified: Secondary | ICD-10-CM | POA: Diagnosis not present

## 2012-11-26 DIAGNOSIS — G894 Chronic pain syndrome: Secondary | ICD-10-CM | POA: Diagnosis not present

## 2012-11-26 DIAGNOSIS — IMO0001 Reserved for inherently not codable concepts without codable children: Secondary | ICD-10-CM | POA: Diagnosis not present

## 2012-12-01 ENCOUNTER — Other Ambulatory Visit: Payer: Self-pay | Admitting: Cardiovascular Disease

## 2012-12-02 DIAGNOSIS — M461 Sacroiliitis, not elsewhere classified: Secondary | ICD-10-CM | POA: Diagnosis not present

## 2012-12-02 DIAGNOSIS — G894 Chronic pain syndrome: Secondary | ICD-10-CM | POA: Diagnosis not present

## 2012-12-02 DIAGNOSIS — IMO0001 Reserved for inherently not codable concepts without codable children: Secondary | ICD-10-CM | POA: Diagnosis not present

## 2012-12-02 DIAGNOSIS — M5137 Other intervertebral disc degeneration, lumbosacral region: Secondary | ICD-10-CM | POA: Diagnosis not present

## 2012-12-05 ENCOUNTER — Other Ambulatory Visit: Payer: Self-pay

## 2012-12-05 ENCOUNTER — Emergency Department (HOSPITAL_COMMUNITY)
Admission: EM | Admit: 2012-12-05 | Discharge: 2012-12-05 | Disposition: A | Discharge status: Home / Self Care | Payer: Medicare Other | Attending: Emergency Medicine | Admitting: Emergency Medicine

## 2012-12-05 ENCOUNTER — Emergency Department (HOSPITAL_COMMUNITY): Payer: Medicare Other

## 2012-12-05 DIAGNOSIS — M545 Low back pain, unspecified: Secondary | ICD-10-CM | POA: Diagnosis not present

## 2012-12-05 DIAGNOSIS — E119 Type 2 diabetes mellitus without complications: Secondary | ICD-10-CM | POA: Diagnosis not present

## 2012-12-05 DIAGNOSIS — R109 Unspecified abdominal pain: Secondary | ICD-10-CM | POA: Insufficient documentation

## 2012-12-05 DIAGNOSIS — Z79899 Other long term (current) drug therapy: Secondary | ICD-10-CM | POA: Diagnosis not present

## 2012-12-05 DIAGNOSIS — R112 Nausea with vomiting, unspecified: Secondary | ICD-10-CM | POA: Insufficient documentation

## 2012-12-05 DIAGNOSIS — K5900 Constipation, unspecified: Secondary | ICD-10-CM | POA: Insufficient documentation

## 2012-12-05 DIAGNOSIS — R6889 Other general symptoms and signs: Secondary | ICD-10-CM | POA: Diagnosis not present

## 2012-12-05 DIAGNOSIS — Z8612 Personal history of poliomyelitis: Secondary | ICD-10-CM | POA: Diagnosis not present

## 2012-12-05 DIAGNOSIS — Z950 Presence of cardiac pacemaker: Secondary | ICD-10-CM | POA: Diagnosis not present

## 2012-12-05 DIAGNOSIS — Z8739 Personal history of other diseases of the musculoskeletal system and connective tissue: Secondary | ICD-10-CM | POA: Diagnosis not present

## 2012-12-05 DIAGNOSIS — Z8679 Personal history of other diseases of the circulatory system: Secondary | ICD-10-CM | POA: Diagnosis not present

## 2012-12-05 DIAGNOSIS — R1110 Vomiting, unspecified: Secondary | ICD-10-CM | POA: Diagnosis not present

## 2012-12-05 DIAGNOSIS — I2510 Atherosclerotic heart disease of native coronary artery without angina pectoris: Secondary | ICD-10-CM | POA: Diagnosis not present

## 2012-12-05 DIAGNOSIS — I10 Essential (primary) hypertension: Secondary | ICD-10-CM | POA: Insufficient documentation

## 2012-12-05 DIAGNOSIS — R3989 Other symptoms and signs involving the genitourinary system: Secondary | ICD-10-CM | POA: Diagnosis not present

## 2012-12-05 DIAGNOSIS — R339 Retention of urine, unspecified: Secondary | ICD-10-CM | POA: Diagnosis not present

## 2012-12-05 DIAGNOSIS — M79604 Pain in right leg: Secondary | ICD-10-CM

## 2012-12-05 LAB — CBC WITH DIFFERENTIAL/PLATELET
Basophils Absolute: 0 10*3/uL (ref 0.0–0.1)
Basophils Relative: 0 % (ref 0–1)
Eosinophils Absolute: 0 10*3/uL (ref 0.0–0.7)
HCT: 39.8 % (ref 36.0–46.0)
Hemoglobin: 13.2 g/dL (ref 12.0–15.0)
MCH: 27.4 pg (ref 26.0–34.0)
MCHC: 33.2 g/dL (ref 30.0–36.0)
Monocytes Absolute: 1.2 10*3/uL — ABNORMAL HIGH (ref 0.1–1.0)
Monocytes Relative: 7 % (ref 3–12)
Neutro Abs: 14.1 10*3/uL — ABNORMAL HIGH (ref 1.7–7.7)
RDW: 14.4 % (ref 11.5–15.5)

## 2012-12-05 LAB — COMPREHENSIVE METABOLIC PANEL
AST: 14 U/L (ref 0–37)
Albumin: 3.5 g/dL (ref 3.5–5.2)
BUN: 16 mg/dL (ref 6–23)
Calcium: 9.2 mg/dL (ref 8.4–10.5)
Chloride: 99 mEq/L (ref 96–112)
Creatinine, Ser: 0.66 mg/dL (ref 0.50–1.10)
Total Protein: 6.6 g/dL (ref 6.0–8.3)

## 2012-12-05 LAB — URINALYSIS, ROUTINE W REFLEX MICROSCOPIC
Bilirubin Urine: NEGATIVE
Ketones, ur: NEGATIVE mg/dL
Leukocytes, UA: NEGATIVE
Nitrite: NEGATIVE
Protein, ur: NEGATIVE mg/dL

## 2012-12-05 LAB — LIPASE, BLOOD: Lipase: 22 U/L (ref 11–59)

## 2012-12-05 MED ORDER — FLEET ENEMA 7-19 GM/118ML RE ENEM
1.0000 | ENEMA | Freq: Once | RECTAL | Status: AC
  Administered 2012-12-05: 1 via RECTAL
  Filled 2012-12-05: qty 1

## 2012-12-05 MED ORDER — POLYETHYLENE GLYCOL 3350 17 GM/SCOOP PO POWD: 17.0000 g | Freq: Every day | ORAL | Status: DC

## 2012-12-05 MED ORDER — ONDANSETRON HCL 4 MG/2ML IJ SOLN
4.0000 mg | Freq: Once | INTRAMUSCULAR | Status: DC
  Filled 2012-12-05: qty 2

## 2012-12-05 NOTE — ED Provider Notes (Signed)
History    CSN: [id number] Arrival date & time 12/05/12  1241  First MD Initiated Contact with Patient 12/05/12 1500     Chief Complaint  Patient presents with  . Constipation  . Urinary Retention   (Consider location/radiation/quality/duration/timing/severity/associated sxs/prior Treatment) HPI Patient presents with complaint of constipation as well as urinary retention. She has a history of low back pain and right-sided sciatic type pain. Her pain management physician advised her to increase her hydrocodone does over the past 3-4 days this week. This has helped with the pain but as a result she has not had a bowel movement in 2 days. She complains of cramping abdominal pain due to the feeling of needing to pass a bowel movement. She has tried MiraLAX magnesium citrate and glycerin suppository. She has also become nauseated and had emesis x1 today. Throughout the day today she has had difficulty urinating and lower abdominal discomfort to do this as well. She denies any fever. She denies weakness or numbness in her legs. Her pain in her low back is unchanged and radiates to her right hip. She's had no new injuries or trauma.  There are no other associated systemic symptoms, there are no other alleviating or modifying factors.  Past Medical History  Diagnosis Date  . Hypertension   . Paroxysmal atrial fibrillation   . Diabetes mellitus   . Allergic rhinitis   . DJD (degenerative joint disease)   . Bradycardia     s/p PPM by Dr [name]  . CAD (coronary artery disease)     70% RCA stenosis, treated medically  . Polio     as a child   Past Surgical History  Procedure Laterality Date  . Cholecystectomy    . Total abdominal hysterectomy    . Pacemaker insertion  12/03/10    SJM Acc[employer] DR RF implanted by Dr [name]  . Arm surgery d/t fx    . Tee without cardioversion N/A 07/30/2012    Procedure: TRANSESOPHAGEAL ECHOCARDIOGRAM (TEE);  Surgeon: [name], MD;  Location: [facility]  ENDOSCOPY;  Service: Cardiovascular;  Laterality: N/A;  h&p in file-hope  . Atrial fibrillation ablation  07/31/2012    PVI by Dr [name]   Family History  Problem Relation Age of Onset  . Heart disease Sister   . Breast cancer Sister   . Breast cancer Sister    History  Substance Use Topics  . Smoking status: Never Smoker   . Smokeless tobacco: Not on file  . Alcohol Use: No   OB History   Grav Para Term Preterm Abortions TAB SAB Ect Mult Living                 Review of Systems ROS reviewed and all otherwise negative except for mentioned in HPI  Allergies  Codeine; Demerol; and Ketorolac  Home Medications   Current Outpatient Rx  Name  Route  Sig  Dispense  Refill  . acetaminophen (TYLENOL) 500 MG tablet   Oral   Take 1,000 mg by mouth every 6 (six) hours as needed for pain.         . Calcium Carbonate-Vitamin D (CALCIUM 600+D) 600-400 MG-UNIT per tablet   Oral   Take 1 tablet by mouth daily.           . Cholecalciferol (VITAMIN D3) 1000 UNITS CAPS   Oral   Take 1 capsule by mouth daily.           . colesevelam (WELCHOL) 625 MG tablet  Oral   Take 1,250 mg by mouth 2 (two) times daily with a meal.          . dabigatran (PRADAXA) 150 MG CAPS   Oral   Take 150 mg by mouth every 12 (twelve) hours.         Marland Kitchen diltiazem (DILACOR XR) 120 MG 24 hr capsule      TAKE ONE CAPSULE BY MOUTH EVERY DAY   30 capsule   6   . dronedarone (MULTAQ) 400 MG tablet   Oral   Take 400 mg by mouth 2 (two) times daily with a meal.         . hydrochlorothiazide (MICROZIDE) 12.5 MG capsule   Oral   Take 12.5 mg by mouth daily.          Marland Kitchen HYDROcodone-acetaminophen (NORCO) 5-325 MG per tablet   Oral   Take 1 tablet by mouth every 4 (four) hours as needed for pain.   20 tablet   0   . loperamide (IMODIUM) 2 MG capsule   Oral   Take 2 mg by mouth every 6 (six) hours as needed for diarrhea or loose stools.         . metoprolol (LOPRESSOR) 50 MG tablet    Oral   Take 50 mg by mouth 2 (two) times daily.          . ondansetron (ZOFRAN) 8 MG tablet   Oral   Take 1 tablet (8 mg total) by mouth every 8 (eight) hours as needed for nausea.   20 tablet   0   . pantoprazole (PROTONIX) 40 MG tablet   Oral   Take 40 mg by mouth daily.          Marland Kitchen propylthiouracil (PTU) 50 MG tablet   Oral   Take 50-100 mg by mouth See admin instructions. Take 2 tabs in the AM and 1 tab in the PM         . saxagliptin HCl (ONGLYZA) 5 MG TABS tablet   Oral   Take 5 mg by mouth daily.         Marland Kitchen tiZANidine (ZANAFLEX) 4 MG tablet   Oral   Take 4 mg by mouth every 6 (six) hours as needed.         . traMADol (ULTRAM) 50 MG tablet   Oral   Take 25 mg by mouth every 6 (six) hours as needed.          . valsartan (DIOVAN) 80 MG tablet   Oral   Take 80 mg by mouth daily.         . polyethylene glycol powder (MIRALAX) powder   Oral   Take 17 g by mouth daily.   255 g   0    BP 128/57  Pulse 62  Temp(Src) 98.5 F (36.9 C) (Oral)  Resp 21  SpO2 98% Vitals reviewed Physical Exam Physical Examination: General appearance - alert, well appearing, and in no distress Mental status - alert, oriented to person, place, and time Eyes - no conjunctival injection, no scleral icterus Mouth - mucous membranes moist, pharynx normal without lesions Chest - clear to auscultation, no wheezes, rales or rhonchi, symmetric air entry Heart - normal rate, regular rhythm, normal S1, S2, no murmurs, rubs, clicks or gallops Abdomen - soft, nontender, nondistended, no masses or organomegaly GU- foley in place, draining clear yellow urine Back exam -ttp on right paraspinal lumbar region Neurological - alert, oriented, normal speech, strength  5/5 in extremities x 4, sensation intact Extremities - peripheral pulses normal, no pedal edema, no clubbing or cyanosis Skin - normal coloration and turgor, no rashes  ED Course  Procedures (including critical care  time)  4:43 PM xray images reviewed and interpreted by me, findings c/w constipation- no sign of SBO  5:30pm discussed patient presentation, findings with Dr. Newell Coral, neurosurgery, he has reviewed scan as well (pt unable to have MRI due to pacer)- he states he doubts the area of this lesion would be causing urinary retention- pt should have f/u with urology.  She will likely need an myelogram to further characterize the lesion.  However she will need to be off pradaxa for approx 2 weeks before any invasive testing/procedure can be done.  He states he will see her in his office this week.  All this was relayed to patient and family at bedside in detail.   7:15 PM pt had large amount of stool output after enema.  Plan to d/c with plan as above.  Will leave foley catheter in place and have pt f/u with urology as well.      Date: 12/05/2012  Rate: 69  Rhythm: normal sinus rhythm  QRS Axis: normal  Intervals: normal  ST/T Wave abnormalities: normal  Conduction Disutrbances: none  Narrative Interpretation: pacer spikes evident on prior ekg of 08/01/12     Labs Reviewed  URINALYSIS, ROUTINE W REFLEX MICROSCOPIC - Abnormal; Notable for the following:    APPearance CLOUDY (*)    All other components within normal limits  CBC WITH DIFFERENTIAL - Abnormal; Notable for the following:    WBC 17.3 (*)    Neutrophils Relative % 82 (*)    Neutro Abs 14.1 (*)    Lymphocytes Relative 11 (*)    Monocytes Absolute 1.2 (*)    All other components within normal limits  COMPREHENSIVE METABOLIC PANEL - Abnormal; Notable for the following:    Glucose, Bld 140 (*)    GFR calc non Af Amer 89 (*)    All other components within normal limits  LIPASE, BLOOD   Ct Lumbar Spine Wo Contrast  12/05/2012   *RADIOLOGY REPORT*  Clinical Data: Low back pain radiating to the right hip.  Nausea and vomiting.  Constipation.  Urinary retention.  CT LUMBAR SPINE WITHOUT CONTRAST  Technique:  Multidetector CT imaging of the  lumbar spine was performed without intravenous contrast administration. Multiplanar CT image reconstructions were also generated.  Comparison: 11/09/2012  Findings: A transitional lumbosacral vertebra is assumed to represent the S1 level.  If procedural intervention is to be performed, careful correlation with this numbering strategy is recommended.  There is dextroconvex lumbar scoliosis  On the right at the L4-5 level, there appears to be a 0.9 x 0.7 cm density along the right side of the thecal sac adjacent to the facet joint.  This is likely causing right eccentric central stenosis as well as right subarticular lateral recess stenosis, and is significantly focally greater than would be expected for the ligament flavum.  I am suspicious for a complex synovial cyst or small mass such as meningioma.  This would be an unusual location for a disc fragment.  It is difficult to sinus as intradural or extradural on CT scan, although extradural would be favored.  No other specific impinging lesion in the lumbar spine is observed.  IMPRESSION:  1.  Right lateral hyperdense lesion along the thecal sac at the L4- 5 level, most likely a complex synovial  cyst or small mass, believed to be causing considerable right eccentric central stenosis and possible right subarticular lateral recess stenosis. The patient has a pacemaker, and presumably it is not MRI compatible.  Lumbar myelogram may be warranted to confirm and further characterize the above findings.   Original Report Authenticated By: Gaylyn Rong, M.D.   Dg Abd Acute W/chest  12/05/2012   *RADIOLOGY REPORT*  Clinical Data: Abdominal pain, vomiting  ACUTE ABDOMEN SERIES (ABDOMEN 2 VIEW & CHEST 1 VIEW)  Comparison: Prior PET CT 01/29/2011, Prior chest x-ray 04/10/2012  Findings:   The lungs are clear.  There is mild cardiomegaly.  A left subclavian approach cardiac rhythm maintenance device is present, leads project over the right atrium and right ventricle. No  large effusion or pneumothorax.  There is a 6 mm nodular opacity projecting over the left lung base.  This is unchanged over multiple prior studies.  Degenerative changes noted in the bilateral acromioclavicular joints.  Stable central bronchitic changes.  Nonspecific, not definitively obstructed bowel gas pattern.  Gas and stool noted throughout the colon and into the rectum.  Surgical clips the right upper quadrant suggest prior cholecystectomy.  No free air on the lateral decubitus view.  There are some small air fluid levels within the colon.  Mild dextroconvex scoliosis of the lumbar spine with apex at L2-L3.  The bones are osteopenic. Degenerative osteoarthritis of the bilateral hip joints.  IMPRESSION:  1.  No acute cardiopulmonary disease. 2.  Nonobstructed bowel gas pattern. 3.  Moderate volume of formed stool overlying the rectum suggests an element of underlying constipation.   Original Report Authenticated By: Malachy Moan, M.D.   1. Urinary retention   2. Constipation   3. Low back pain radiating to right leg     MDM  Pt presenting with c/o constipation, continued low back pain radiating to right hip and new onset of urinary retention today.  Foley catheter placed, constipation releived after enema.  CT lumbar spine results as above.  There is a small mass lesion/cyst at L4L5 region- d/w neurosurgery as above.  He believes urinary retention would be unlikely to result from this finding on CT scan.  Pt advised to stop her pradaxa as Dr. Newell Coral cannot do any invasive studies or procedures while she is taking this.  She will f/u with him this week.  I have counseled patient about the risk of stroke being off pradaxa, but to address lumbar issues it will be necessary.  I have spent approx 30 minutes in room with family explaining plan and answering their questions to the best of my ability.  Pt will f/u with NSG and with urology.  Discharged with strict return precautions.  Pt agreeable with  plan.   Ethelda Chick, MD 12/05/12 [phone]

## 2012-12-05 NOTE — ED Notes (Signed)
Bladder scanner showed 637 ml; urinary catheter inserted

## 2012-12-05 NOTE — ED Notes (Signed)
Pt in CT.

## 2012-12-05 NOTE — ED Notes (Addendum)
Per ems: pt was at urgent care today d/t constipation x2 days and difficulty urinating. Pt states she has not urinated since 0600 this morning. Pt was sitting in waiting room at [facility], became nauseated and "almost passed out". [facility] called EMS to transport pt to ED. Pt c/o abd pain. Pt also reports right sided hip pain, began 3 months ago. Has been taking pain medication for hip, and recently got a stronger narcotic which she believes is causing the constipation. Abd slightly distended, painful to palpation. Pt reports taking suppositories and laxative without relief of symptoms. bp 157/80, pulse 72 NSR, respirations 20, saO2 95% ra

## 2012-12-05 NOTE — ED Notes (Signed)
Pt reported that fleet enema was not working and she could not feel the solution inside her. Advised pt to try to sit on bed side commode to see if she could move bowels. Will notify MD

## 2012-12-05 NOTE — ED Notes (Signed)
ONG:[id number]<WU> Expected date:12/05/12<BR> Expected time:12:38 PM<BR> Means of arrival:Ambulance<BR> Comments:<BR> Constipation, diff urinating

## 2012-12-05 NOTE — ED Notes (Signed)
Went to administer soap suds enema, pt was still sitting on commode, said she did not have BM yet. There was a large amount of stool in commode, pt was unaware that she moved bowels. Did notice slight decrease in abd pain. Will notify MD

## 2012-12-07 ENCOUNTER — Telehealth: Payer: Self-pay | Admitting: Cardiovascular Disease

## 2012-12-07 NOTE — Telephone Encounter (Signed)
Will notify Extender this afternoon to review chart.

## 2012-12-07 NOTE — Telephone Encounter (Signed)
She was seen at [facility]- ED on 7.5.14 and Dr. Newell Coral is the neurosurgeon.  Record in Epic.

## 2012-12-07 NOTE — Telephone Encounter (Signed)
Discussed with Dr. Herbie Baltimore -OK to hold Pradaxa at least 3 days prior to surgery, and if possible resume 36 hours later if Dr. Jule Ser feels it will be safe.  Please have Dr. Newell Coral contact our office for any questions.

## 2012-12-07 NOTE — Telephone Encounter (Signed)
What hospital was she in and why.  What neurosurgeon was it?

## 2012-12-07 NOTE — Telephone Encounter (Signed)
Returned call and informed pt per instructions by MD/PA.  Pt verbalized understanding and agreed w/ plan.  Pt informed RN will send info to Dr. Newell Coral for her.  Pt verbalized understanding that she will need to restart Pradaxa if surgery is not going to be done soon and hold again prior to procedure.  Pt stated office number is [phone].  Letter drafted and sent to Dr. Gretta Arab.  Call to office and left message w/ Dr. Earl Gala secretary that [facility] message was sent w/ Cardiac Clearance and to call the office w/ any questions tomorrow between 8am and 4pm.

## 2012-12-07 NOTE — Telephone Encounter (Signed)
Message forwarded to L. Annie Paras, NP for further instructions.  Chart# [id number]

## 2012-12-07 NOTE — Telephone Encounter (Signed)
Pt was in the hospital on Saturday and the neurosurgeon took her off of Pradaxa and wanted to let Dr. Salena Saner know

## 2012-12-09 DIAGNOSIS — R339 Retention of urine, unspecified: Secondary | ICD-10-CM | POA: Diagnosis not present

## 2012-12-15 ENCOUNTER — Encounter (HOSPITAL_COMMUNITY): Payer: Self-pay | Admitting: Pharmacy Technician

## 2012-12-15 ENCOUNTER — Other Ambulatory Visit: Payer: Self-pay | Admitting: Neurosurgery

## 2012-12-15 DIAGNOSIS — M545 Low back pain, unspecified: Secondary | ICD-10-CM | POA: Diagnosis not present

## 2012-12-15 DIAGNOSIS — M47817 Spondylosis without myelopathy or radiculopathy, lumbosacral region: Secondary | ICD-10-CM | POA: Diagnosis not present

## 2012-12-15 DIAGNOSIS — M546 Pain in thoracic spine: Secondary | ICD-10-CM | POA: Diagnosis not present

## 2012-12-15 DIAGNOSIS — M7130 Other bursal cyst, unspecified site: Secondary | ICD-10-CM | POA: Diagnosis not present

## 2012-12-18 ENCOUNTER — Ambulatory Visit (HOSPITAL_COMMUNITY)
Admission: RE | Admit: 2012-12-18 | Discharge: 2012-12-18 | Disposition: A | Discharge status: Home / Self Care | Payer: Medicare Other | Source: Ambulatory Visit | Attending: Neurosurgery | Admitting: Neurosurgery

## 2012-12-18 ENCOUNTER — Encounter (HOSPITAL_COMMUNITY)
Admission: RE | Admit: 2012-12-18 | Discharge: 2012-12-18 | Disposition: A | Discharge status: Home / Self Care | Payer: Medicare Other | Source: Ambulatory Visit | Attending: Neurosurgery | Admitting: Neurosurgery

## 2012-12-18 ENCOUNTER — Encounter (HOSPITAL_COMMUNITY): Payer: Self-pay

## 2012-12-18 DIAGNOSIS — Z01818 Encounter for other preprocedural examination: Secondary | ICD-10-CM | POA: Insufficient documentation

## 2012-12-18 DIAGNOSIS — Z950 Presence of cardiac pacemaker: Secondary | ICD-10-CM | POA: Insufficient documentation

## 2012-12-18 DIAGNOSIS — Z01812 Encounter for preprocedural laboratory examination: Secondary | ICD-10-CM | POA: Diagnosis not present

## 2012-12-18 HISTORY — DX: Pneumonia, unspecified organism: J18.9

## 2012-12-18 HISTORY — DX: Thyrotoxicosis, unspecified without thyrotoxic crisis or storm: E05.90

## 2012-12-18 LAB — CBC
HCT: 39.8 % (ref 36.0–46.0)
Hemoglobin: 12.9 g/dL (ref 12.0–15.0)
MCH: 27.3 pg (ref 26.0–34.0)
MCHC: 32.4 g/dL (ref 30.0–36.0)
MCV: 84.3 fL (ref 78.0–100.0)
Platelets: 299 10*3/uL (ref 150–400)
RBC: 4.72 MIL/uL (ref 3.87–5.11)
RDW: 14.7 % (ref 11.5–15.5)
WBC: 11.9 10*3/uL — ABNORMAL HIGH (ref 4.0–10.5)

## 2012-12-18 LAB — BASIC METABOLIC PANEL
BUN: 13 mg/dL (ref 6–23)
CO2: 26 mEq/L (ref 19–32)
Calcium: 9.5 mg/dL (ref 8.4–10.5)
Chloride: 101 mEq/L (ref 96–112)
Creatinine, Ser: 0.78 mg/dL (ref 0.50–1.10)
GFR calc Af Amer: >90 mL/min (ref >90)
GFR calc non Af Amer: 85 mL/min — ABNORMAL LOW (ref >90)
Glucose, Bld: 105 mg/dL — ABNORMAL HIGH (ref 70–99)
Potassium: 3.8 mEq/L (ref 3.5–5.1)
Sodium: 138 mEq/L (ref 135–145)

## 2012-12-18 LAB — SURGICAL PCR SCREEN
MRSA, PCR: NEGATIVE
Staphylococcus aureus: NEGATIVE

## 2012-12-18 NOTE — Progress Notes (Signed)
Clearance note from sehv 7/14,6/14,   ekg 7/14 pacemaker order faxed

## 2012-12-18 NOTE — Pre-Procedure Instructions (Addendum)
Hailey [name]  12/18/2012   Your procedure is scheduled on:  December 23, 2012  Report to [facility] at 6:30 AM.  Call this number if you have problems the morning of surgery: [phone]   Remember:   Do not eat food or drink liquids after midnight.   Take these medicines the morning of surgery with A SIP OF WATER: dronedarone (MULTAQ), HYDROcodone-acetaminophen (NORCO/VICODIN), metoprolol (LOPRESSOR), ondansetron (ZOFRAN) (if needed), pantoprazole (PROTONIX), propylthiouracil (PTU), diltiazem (DILACOR XR)   Discontinue Aspirin, Coumadin, Plavix, Effient and herbal medication  7 days prior to surgery.  Stop Pradaxa as ordered by your physician.                 Do not wear jewelry, make-up or nail polish.  Do not wear lotions, powders, or perfumes. You may wear deodorant.  Do not shave 48 hours prior to surgery.              Do not bring valuables to the hospital.  [facility] is not responsible for any belongings or valuables.  Contacts, dentures or bridgework may not be worn into surgery.  Leave suitcase in the car. After surgery it may be brought to your room.  For patients admitted to the hospital, checkout time is 11:00 AM the day of  discharge.    Special Instructions: Shower using CHG 2 nights before surgery and the night before surgery.  If you shower the day of surgery use CHG.  Use special wash - you have one bottle of CHG for all showers.  You should use approximately 1/3 of the bottle for each shower.   Please read over the following fact sheets that you were given: Pain Booklet, Coughing and Deep Breathing, MRSA Information and Surgical Site Infection Prevention

## 2012-12-22 MED ORDER — CEFAZOLIN SODIUM-DEXTROSE 2-3 GM-% IV SOLR
2.0000 g | INTRAVENOUS | Status: AC
  Administered 2012-12-23: 2 g via INTRAVENOUS
  Filled 2012-12-22: qty 50

## 2012-12-23 ENCOUNTER — Inpatient Hospital Stay (HOSPITAL_COMMUNITY): Payer: Medicare Other | Admitting: *Deleted

## 2012-12-23 ENCOUNTER — Encounter (HOSPITAL_COMMUNITY)
Admission: RE | Disposition: A | Discharge status: Home / Self Care | Payer: Self-pay | Source: Ambulatory Visit | Attending: Neurosurgery

## 2012-12-23 ENCOUNTER — Inpatient Hospital Stay (HOSPITAL_COMMUNITY): Payer: Medicare Other

## 2012-12-23 ENCOUNTER — Encounter (HOSPITAL_COMMUNITY): Payer: Self-pay | Admitting: *Deleted

## 2012-12-23 ENCOUNTER — Inpatient Hospital Stay (HOSPITAL_COMMUNITY)
Admission: RE | Admit: 2012-12-23 | Discharge: 2012-12-24 | DRG: 491 | Disposition: A | Discharge status: Home / Self Care | Payer: Medicare Other | Source: Ambulatory Visit | Attending: Neurosurgery | Admitting: Neurosurgery

## 2012-12-23 DIAGNOSIS — I10 Essential (primary) hypertension: Secondary | ICD-10-CM | POA: Diagnosis not present

## 2012-12-23 DIAGNOSIS — E119 Type 2 diabetes mellitus without complications: Secondary | ICD-10-CM | POA: Diagnosis present

## 2012-12-23 DIAGNOSIS — Z9089 Acquired absence of other organs: Secondary | ICD-10-CM

## 2012-12-23 DIAGNOSIS — Z79899 Other long term (current) drug therapy: Secondary | ICD-10-CM | POA: Diagnosis not present

## 2012-12-23 DIAGNOSIS — I4891 Unspecified atrial fibrillation: Secondary | ICD-10-CM | POA: Diagnosis present

## 2012-12-23 DIAGNOSIS — R339 Retention of urine, unspecified: Secondary | ICD-10-CM | POA: Diagnosis not present

## 2012-12-23 DIAGNOSIS — I498 Other specified cardiac arrhythmias: Secondary | ICD-10-CM | POA: Diagnosis present

## 2012-12-23 DIAGNOSIS — M5137 Other intervertebral disc degeneration, lumbosacral region: Secondary | ICD-10-CM | POA: Diagnosis not present

## 2012-12-23 DIAGNOSIS — Z803 Family history of malignant neoplasm of breast: Secondary | ICD-10-CM

## 2012-12-23 DIAGNOSIS — Z950 Presence of cardiac pacemaker: Secondary | ICD-10-CM | POA: Diagnosis not present

## 2012-12-23 DIAGNOSIS — E0500 Thyrotoxicosis with diffuse goiter without thyrotoxic crisis or storm: Secondary | ICD-10-CM | POA: Diagnosis present

## 2012-12-23 DIAGNOSIS — M545 Low back pain, unspecified: Secondary | ICD-10-CM | POA: Diagnosis not present

## 2012-12-23 DIAGNOSIS — M47817 Spondylosis without myelopathy or radiculopathy, lumbosacral region: Secondary | ICD-10-CM | POA: Diagnosis not present

## 2012-12-23 DIAGNOSIS — Z8249 Family history of ischemic heart disease and other diseases of the circulatory system: Secondary | ICD-10-CM | POA: Diagnosis not present

## 2012-12-23 DIAGNOSIS — Z90711 Acquired absence of uterus with remaining cervical stump: Secondary | ICD-10-CM

## 2012-12-23 DIAGNOSIS — M51379 Other intervertebral disc degeneration, lumbosacral region without mention of lumbar back pain or lower extremity pain: Secondary | ICD-10-CM | POA: Diagnosis present

## 2012-12-23 DIAGNOSIS — M7130 Other bursal cyst, unspecified site: Principal | ICD-10-CM | POA: Diagnosis present

## 2012-12-23 DIAGNOSIS — Z8612 Personal history of poliomyelitis: Secondary | ICD-10-CM

## 2012-12-23 DIAGNOSIS — I2510 Atherosclerotic heart disease of native coronary artery without angina pectoris: Secondary | ICD-10-CM | POA: Diagnosis not present

## 2012-12-23 HISTORY — PX: LUMBAR LAMINECTOMY/DECOMPRESSION MICRODISCECTOMY: SHX5026

## 2012-12-23 LAB — GLUCOSE, CAPILLARY
Glucose-Capillary: 112 mg/dL — ABNORMAL HIGH (ref 70–99)
Glucose-Capillary: 147 mg/dL — ABNORMAL HIGH (ref 70–99)
Glucose-Capillary: 137 mg/dL — ABNORMAL HIGH (ref 70–99)
Glucose-Capillary: 174 mg/dL — ABNORMAL HIGH (ref 70–99)

## 2012-12-23 SURGERY — LUMBAR LAMINECTOMY/DECOMPRESSION MICRODISCECTOMY 1 LEVEL
Anesthesia: General | Site: Back | Laterality: Right | Wound class: Clean

## 2012-12-23 MED ORDER — ZOLPIDEM TARTRATE 5 MG PO TABS
5.0000 mg | ORAL_TABLET | Freq: Every evening | ORAL | Status: DC | PRN
  Filled 2012-12-23: qty 1

## 2012-12-23 MED ORDER — LINAGLIPTIN 5 MG PO TABS
5.0000 mg | ORAL_TABLET | Freq: Every day | ORAL | Status: DC
  Administered 2012-12-23 – 2012-12-24 (×2): 5 mg via ORAL
  Filled 2012-12-23 (×2): qty 1

## 2012-12-23 MED ORDER — ACETAMINOPHEN 650 MG RE SUPP: 650.0000 mg | RECTAL | Status: DC | PRN

## 2012-12-23 MED ORDER — OXYCODONE HCL 5 MG PO TABS: 5.0000 mg | ORAL_TABLET | Freq: Once | ORAL | Status: DC | PRN

## 2012-12-23 MED ORDER — DILTIAZEM HCL ER 120 MG PO CP24
120.0000 mg | ORAL_CAPSULE | Freq: Every day | ORAL | Status: DC
Start: 2012-12-23 — End: 2012-12-24
  Administered 2012-12-24: 120 mg via ORAL
  Filled 2012-12-23 (×2): qty 1

## 2012-12-23 MED ORDER — OXYCODONE HCL 5 MG PO TABS
5.0000 mg | ORAL_TABLET | ORAL | Status: DC | PRN
  Administered 2012-12-23: 10 mg via ORAL
  Administered 2012-12-23: 5 mg via ORAL
  Filled 2012-12-23: qty 1
  Filled 2012-12-23: qty 2

## 2012-12-23 MED ORDER — SODIUM CHLORIDE 0.9 % IR SOLN
Status: DC | PRN
  Administered 2012-12-23: 09:00:00

## 2012-12-23 MED ORDER — DRONEDARONE HCL 400 MG PO TABS
400.0000 mg | ORAL_TABLET | Freq: Two times a day (BID) | ORAL | Status: DC
  Administered 2012-12-23 – 2012-12-24 (×2): 400 mg via ORAL
  Filled 2012-12-23 (×5): qty 1

## 2012-12-23 MED ORDER — MORPHINE SULFATE 4 MG/ML IJ SOLN: 4.0000 mg | INTRAMUSCULAR | Status: DC | PRN

## 2012-12-23 MED ORDER — FENTANYL CITRATE 0.05 MG/ML IJ SOLN
INTRAMUSCULAR | Status: AC
  Filled 2012-12-23: qty 2

## 2012-12-23 MED ORDER — HEMOSTATIC AGENTS (NO CHARGE) OPTIME
TOPICAL | Status: DC | PRN
  Administered 2012-12-23: 1 via TOPICAL

## 2012-12-23 MED ORDER — BISACODYL 10 MG RE SUPP: 10.0000 mg | Freq: Every day | RECTAL | Status: DC | PRN

## 2012-12-23 MED ORDER — PROPYLTHIOURACIL 50 MG PO TABS: 50.0000 mg | ORAL_TABLET | ORAL | Status: DC

## 2012-12-23 MED ORDER — MIDAZOLAM HCL 5 MG/5ML IJ SOLN
INTRAMUSCULAR | Status: DC | PRN
  Administered 2012-12-23: 2 mg via INTRAVENOUS

## 2012-12-23 MED ORDER — METOPROLOL TARTRATE 50 MG PO TABS
50.0000 mg | ORAL_TABLET | Freq: Two times a day (BID) | ORAL | Status: DC
  Administered 2012-12-23 – 2012-12-24 (×2): 50 mg via ORAL
  Filled 2012-12-23 (×4): qty 1

## 2012-12-23 MED ORDER — POLYETHYLENE GLYCOL 3350 17 G PO PACK
17.0000 g | PACK | Freq: Every day | ORAL | Status: DC
  Filled 2012-12-23: qty 1

## 2012-12-23 MED ORDER — OXYCODONE HCL 5 MG/5ML PO SOLN: 5.0000 mg | Freq: Once | ORAL | Status: DC | PRN

## 2012-12-23 MED ORDER — HYDROMORPHONE HCL PF 1 MG/ML IJ SOLN
0.2500 mg | INTRAMUSCULAR | Status: DC | PRN
  Administered 2012-12-23 (×2): 0.25 mg via INTRAVENOUS

## 2012-12-23 MED ORDER — IRBESARTAN 75 MG PO TABS
75.0000 mg | ORAL_TABLET | Freq: Every day | ORAL | Status: DC
  Administered 2012-12-24: 75 mg via ORAL
  Filled 2012-12-23 (×2): qty 1

## 2012-12-23 MED ORDER — PROPYLTHIOURACIL 50 MG PO TABS
50.0000 mg | ORAL_TABLET | Freq: Every day | ORAL | Status: DC
  Administered 2012-12-23: 50 mg via ORAL
  Filled 2012-12-23 (×2): qty 1

## 2012-12-23 MED ORDER — COLESEVELAM HCL 625 MG PO TABS
1250.0000 mg | ORAL_TABLET | Freq: Two times a day (BID) | ORAL | Status: DC
  Administered 2012-12-23: 625 mg via ORAL
  Administered 2012-12-24: 1250 mg via ORAL
  Filled 2012-12-23 (×5): qty 2

## 2012-12-23 MED ORDER — NEOSTIGMINE METHYLSULFATE 1 MG/ML IJ SOLN
INTRAMUSCULAR | Status: DC | PRN
  Administered 2012-12-23: 5 mg via INTRAVENOUS

## 2012-12-23 MED ORDER — THROMBIN 5000 UNITS EX SOLR
CUTANEOUS | Status: DC | PRN
  Administered 2012-12-23 (×2): 5000 [IU] via TOPICAL

## 2012-12-23 MED ORDER — MIDAZOLAM HCL 2 MG/2ML IJ SOLN: 0.5000 mg | Freq: Once | INTRAMUSCULAR | Status: DC | PRN

## 2012-12-23 MED ORDER — ACETAMINOPHEN 325 MG PO TABS
650.0000 mg | ORAL_TABLET | ORAL | Status: DC | PRN
  Administered 2012-12-24: 650 mg via ORAL
  Filled 2012-12-23: qty 2

## 2012-12-23 MED ORDER — ROCURONIUM BROMIDE 100 MG/10ML IV SOLN
INTRAVENOUS | Status: DC | PRN
  Administered 2012-12-23: 50 mg via INTRAVENOUS

## 2012-12-23 MED ORDER — CYCLOBENZAPRINE HCL 10 MG PO TABS: 10.0000 mg | ORAL_TABLET | Freq: Three times a day (TID) | ORAL | Status: DC | PRN

## 2012-12-23 MED ORDER — 0.9 % SODIUM CHLORIDE (POUR BTL) OPTIME
TOPICAL | Status: DC | PRN
  Administered 2012-12-23: 1000 mL

## 2012-12-23 MED ORDER — PROMETHAZINE HCL 25 MG/ML IJ SOLN
INTRAMUSCULAR | Status: AC
  Filled 2012-12-23: qty 1

## 2012-12-23 MED ORDER — ONDANSETRON HCL 4 MG/2ML IJ SOLN
INTRAMUSCULAR | Status: DC | PRN
  Administered 2012-12-23: 4 mg via INTRAVENOUS

## 2012-12-23 MED ORDER — SUFENTANIL CITRATE 50 MCG/ML IV SOLN
INTRAVENOUS | Status: DC | PRN
  Administered 2012-12-23 (×3): 10 ug via INTRAVENOUS

## 2012-12-23 MED ORDER — ACETAMINOPHEN 10 MG/ML IV SOLN
1000.0000 mg | Freq: Four times a day (QID) | INTRAVENOUS | Status: AC
  Administered 2012-12-23 – 2012-12-24 (×4): 1000 mg via INTRAVENOUS
  Filled 2012-12-23 (×4): qty 100

## 2012-12-23 MED ORDER — PROPYLTHIOURACIL 50 MG PO TABS
100.0000 mg | ORAL_TABLET | Freq: Every day | ORAL | Status: DC
  Administered 2012-12-24: 100 mg via ORAL
  Filled 2012-12-23: qty 2

## 2012-12-23 MED ORDER — ALUM & MAG HYDROXIDE-SIMETH 200-200-20 MG/5ML PO SUSP: 30.0000 mL | Freq: Four times a day (QID) | ORAL | Status: DC | PRN

## 2012-12-23 MED ORDER — MENTHOL 3 MG MT LOZG: 1.0000 | LOZENGE | OROMUCOSAL | Status: DC | PRN

## 2012-12-23 MED ORDER — LACTATED RINGERS IV SOLN
INTRAVENOUS | Status: DC | PRN
  Administered 2012-12-23 (×2): via INTRAVENOUS

## 2012-12-23 MED ORDER — FENTANYL CITRATE 0.05 MG/ML IJ SOLN
INTRAMUSCULAR | Status: DC | PRN
  Administered 2012-12-23: 100 ug via INTRAVENOUS

## 2012-12-23 MED ORDER — HYDROCHLOROTHIAZIDE 12.5 MG PO CAPS
12.5000 mg | ORAL_CAPSULE | Freq: Every day | ORAL | Status: DC
  Administered 2012-12-23 – 2012-12-24 (×2): 12.5 mg via ORAL
  Filled 2012-12-23 (×2): qty 1

## 2012-12-23 MED ORDER — BUPIVACAINE HCL (PF) 0.5 % IJ SOLN
INTRAMUSCULAR | Status: DC | PRN
  Administered 2012-12-23: 5 mL

## 2012-12-23 MED ORDER — METHYLPREDNISOLONE ACETATE 80 MG/ML IJ SUSP
INTRAMUSCULAR | Status: DC | PRN
  Administered 2012-12-23: 80 mg

## 2012-12-23 MED ORDER — ONDANSETRON HCL 4 MG/2ML IJ SOLN
4.0000 mg | Freq: Four times a day (QID) | INTRAMUSCULAR | Status: DC | PRN
  Administered 2012-12-23: 4 mg via INTRAVENOUS
  Filled 2012-12-23: qty 2

## 2012-12-23 MED ORDER — PROMETHAZINE HCL 25 MG/ML IJ SOLN
6.2500 mg | INTRAMUSCULAR | Status: DC | PRN
  Administered 2012-12-23: 6.25 mg via INTRAVENOUS

## 2012-12-23 MED ORDER — BACITRACIN 50000 UNITS IM SOLR
INTRAMUSCULAR | Status: AC
  Filled 2012-12-23: qty 1

## 2012-12-23 MED ORDER — PROPOFOL 10 MG/ML IV BOLUS
INTRAVENOUS | Status: DC | PRN
  Administered 2012-12-23: 120 mg via INTRAVENOUS

## 2012-12-23 MED ORDER — POTASSIUM CHLORIDE IN NACL 20-0.9 MEQ/L-% IV SOLN
INTRAVENOUS | Status: DC
  Administered 2012-12-23: 19:00:00 via INTRAVENOUS
  Filled 2012-12-23 (×7): qty 1000

## 2012-12-23 MED ORDER — PANTOPRAZOLE SODIUM 40 MG PO TBEC
40.0000 mg | DELAYED_RELEASE_TABLET | Freq: Every day | ORAL | Status: DC
  Administered 2012-12-24: 40 mg via ORAL
  Filled 2012-12-23 (×2): qty 1

## 2012-12-23 MED ORDER — LIDOCAINE-EPINEPHRINE 1 %-1:100000 IJ SOLN
INTRAMUSCULAR | Status: DC | PRN
  Administered 2012-12-23: 5 mL

## 2012-12-23 MED ORDER — SODIUM CHLORIDE 0.9 % IV SOLN: 250.0000 mL | INTRAVENOUS | Status: DC

## 2012-12-23 MED ORDER — HYDROMORPHONE HCL PF 1 MG/ML IJ SOLN
INTRAMUSCULAR | Status: AC
  Filled 2012-12-23: qty 1

## 2012-12-23 MED ORDER — MEPERIDINE HCL 25 MG/ML IJ SOLN: 6.2500 mg | INTRAMUSCULAR | Status: DC | PRN

## 2012-12-23 MED ORDER — POLYETHYLENE GLYCOL 3350 17 GM/SCOOP PO POWD
17.0000 g | Freq: Every day | ORAL | Status: DC
  Filled 2012-12-23: qty 255

## 2012-12-23 MED ORDER — MAGNESIUM HYDROXIDE 400 MG/5ML PO SUSP: 30.0000 mL | Freq: Every day | ORAL | Status: DC | PRN

## 2012-12-23 MED ORDER — PHENOL 1.4 % MT LIQD: 1.0000 | OROMUCOSAL | Status: DC | PRN

## 2012-12-23 MED ORDER — SODIUM CHLORIDE 0.9 % IV SOLN
INTRAVENOUS | Status: AC
  Filled 2012-12-23: qty 500

## 2012-12-23 MED ORDER — GLYCOPYRROLATE 0.2 MG/ML IJ SOLN
INTRAMUSCULAR | Status: DC | PRN
  Administered 2012-12-23: 0.6 mg via INTRAVENOUS

## 2012-12-23 MED ORDER — CEFAZOLIN SODIUM-DEXTROSE 2-3 GM-% IV SOLR
INTRAVENOUS | Status: DC | PRN
  Administered 2012-12-23: 2 g via INTRAVENOUS

## 2012-12-23 MED ORDER — SODIUM CHLORIDE 0.9 % IJ SOLN: 3.0000 mL | INTRAMUSCULAR | Status: DC | PRN

## 2012-12-23 MED ORDER — LIDOCAINE HCL (CARDIAC) 20 MG/ML IV SOLN
INTRAVENOUS | Status: DC | PRN
  Administered 2012-12-23: 50 mg via INTRAVENOUS

## 2012-12-23 MED ORDER — SODIUM CHLORIDE 0.9 % IJ SOLN
3.0000 mL | Freq: Two times a day (BID) | INTRAMUSCULAR | Status: DC
  Administered 2012-12-23 – 2012-12-24 (×3): 3 mL via INTRAVENOUS

## 2012-12-23 MED ORDER — HYDROXYZINE HCL 25 MG PO TABS: 50.0000 mg | ORAL_TABLET | ORAL | Status: DC | PRN

## 2012-12-23 SURGICAL SUPPLY — 61 items
ADH SKN CLS APL DERMABOND .7 (GAUZE/BANDAGES/DRESSINGS)
ADH SKN CLS LQ APL DERMABOND (GAUZE/BANDAGES/DRESSINGS) ×1
APL SKNCLS STERI-STRIP NONHPOA (GAUZE/BANDAGES/DRESSINGS)
BAG DECANTER FOR FLEXI CONT (MISCELLANEOUS) ×2 IMPLANT
BENZOIN TINCTURE PRP APPL 2/3 (GAUZE/BANDAGES/DRESSINGS) IMPLANT
BLADE SURG ROTATE 9660 (MISCELLANEOUS) IMPLANT
BRUSH SCRUB EZ PLAIN DRY (MISCELLANEOUS) ×2 IMPLANT
BUR ACORN 6.0 ACORN (BURR) IMPLANT
BUR ACRON 5.0MM COATED (BURR) IMPLANT
BUR MATCHSTICK NEURO 3.0 LAGG (BURR) ×2 IMPLANT
CANISTER SUCTION 2500CC (MISCELLANEOUS) ×2 IMPLANT
CLOTH BEACON ORANGE TIMEOUT ST (SAFETY) ×2 IMPLANT
CONT SPEC 4OZ CLIKSEAL STRL BL (MISCELLANEOUS) IMPLANT
DERMABOND ADHESIVE PROPEN (GAUZE/BANDAGES/DRESSINGS) ×1
DERMABOND ADVANCED (GAUZE/BANDAGES/DRESSINGS)
DERMABOND ADVANCED .7 DNX12 (GAUZE/BANDAGES/DRESSINGS) IMPLANT
DERMABOND ADVANCED .7 DNX6 (GAUZE/BANDAGES/DRESSINGS) IMPLANT
DRAPE LAPAROTOMY 100X72X124 (DRAPES) ×2 IMPLANT
DRAPE MICROSCOPE LEICA (MISCELLANEOUS) ×2 IMPLANT
DRAPE POUCH INSTRU U-SHP 10X18 (DRAPES) ×2 IMPLANT
DRSG EMULSION OIL 3X3 NADH (GAUZE/BANDAGES/DRESSINGS) IMPLANT
ELECT REM PT RETURN 9FT ADLT (ELECTROSURGICAL) ×2
ELECTRODE REM PT RTRN 9FT ADLT (ELECTROSURGICAL) ×1 IMPLANT
GAUZE SPONGE 4X4 16PLY XRAY LF (GAUZE/BANDAGES/DRESSINGS) IMPLANT
GLOVE BIOGEL PI IND STRL 8 (GLOVE) ×1 IMPLANT
GLOVE BIOGEL PI INDICATOR 8 (GLOVE) ×3
GLOVE ECLIPSE 7.5 STRL STRAW (GLOVE) ×4 IMPLANT
GLOVE EXAM NITRILE LRG STRL (GLOVE) IMPLANT
GLOVE EXAM NITRILE MD LF STRL (GLOVE) ×1 IMPLANT
GLOVE EXAM NITRILE XL STR (GLOVE) IMPLANT
GLOVE EXAM NITRILE XS STR PU (GLOVE) IMPLANT
GOWN BRE IMP SLV AUR LG STRL (GOWN DISPOSABLE) ×1 IMPLANT
GOWN BRE IMP SLV AUR XL STRL (GOWN DISPOSABLE) ×3 IMPLANT
GOWN STRL REIN 2XL LVL4 (GOWN DISPOSABLE) IMPLANT
KIT BASIN OR (CUSTOM PROCEDURE TRAY) ×2 IMPLANT
KIT ROOM TURNOVER OR (KITS) ×2 IMPLANT
NDL HYPO 18GX1.5 BLUNT FILL (NEEDLE) IMPLANT
NDL SPNL 18GX3.5 QUINCKE PK (NEEDLE) ×1 IMPLANT
NDL SPNL 22GX3.5 QUINCKE BK (NEEDLE) ×1 IMPLANT
NEEDLE HYPO 18GX1.5 BLUNT FILL (NEEDLE) IMPLANT
NEEDLE SPNL 18GX3.5 QUINCKE PK (NEEDLE) ×2 IMPLANT
NEEDLE SPNL 22GX3.5 QUINCKE BK (NEEDLE) ×2 IMPLANT
NS IRRIG 1000ML POUR BTL (IV SOLUTION) ×2 IMPLANT
PACK LAMINECTOMY NEURO (CUSTOM PROCEDURE TRAY) ×2 IMPLANT
PAD ARMBOARD 7.5X6 YLW CONV (MISCELLANEOUS) ×6 IMPLANT
PATTIES SURGICAL .5 X1 (DISPOSABLE) ×2 IMPLANT
RUBBERBAND STERILE (MISCELLANEOUS) ×4 IMPLANT
SPONGE GAUZE 4X4 12PLY (GAUZE/BANDAGES/DRESSINGS) IMPLANT
SPONGE LAP 4X18 X RAY DECT (DISPOSABLE) IMPLANT
SPONGE SURGIFOAM ABS GEL SZ50 (HEMOSTASIS) ×2 IMPLANT
STRIP CLOSURE SKIN 1/2X4 (GAUZE/BANDAGES/DRESSINGS) IMPLANT
SUT PROLENE 6 0 BV (SUTURE) IMPLANT
SUT VIC AB 1 CT1 18XBRD ANBCTR (SUTURE) ×1 IMPLANT
SUT VIC AB 1 CT1 8-18 (SUTURE) ×2
SUT VIC AB 2-0 CP2 18 (SUTURE) ×3 IMPLANT
SUT VIC AB 3-0 SH 8-18 (SUTURE) IMPLANT
SYR 20ML ECCENTRIC (SYRINGE) ×2 IMPLANT
SYR 5ML LL (SYRINGE) IMPLANT
TOWEL OR 17X24 6PK STRL BLUE (TOWEL DISPOSABLE) ×2 IMPLANT
TOWEL OR 17X26 10 PK STRL BLUE (TOWEL DISPOSABLE) ×2 IMPLANT
WATER STERILE IRR 1000ML POUR (IV SOLUTION) ×2 IMPLANT

## 2012-12-23 NOTE — Anesthesia Postprocedure Evaluation (Signed)
  Anesthesia Post-op Note  Patient: Hailey [name]  Procedure(s) Performed: Procedure(s) with comments: Right L5-S1 Laminotomy for resection of synovial cyst (Right) - Right Lumbar five-sacral one Laminotomy for resection of synovial cyst  Patient Location: PACU  Anesthesia Type:General  Level of Consciousness: sedated, patient cooperative and responds to stimulation  Airway and Oxygen Therapy: Patient Spontanous Breathing and Patient connected to nasal cannula oxygen  Post-op Pain: 3 /10, mild  Post-op Assessment: Post-op Vital signs reviewed, Patient's Cardiovascular Status Stable, Respiratory Function Stable, Patent Airway, No signs of Nausea or vomiting and Pain level controlled  Post-op Vital Signs: Reviewed and stable  Complications: No apparent anesthesia complications

## 2012-12-23 NOTE — Plan of Care (Signed)
Problem: Consults Goal: Diagnosis - Spinal Surgery Outcome: Completed/Met Date Met:  12/23/12 Lumbar Laminectomy (Complex)     

## 2012-12-23 NOTE — Anesthesia Preprocedure Evaluation (Signed)
Anesthesia Evaluation  Patient identified by MRN, date of birth, ID band Patient awake    Reviewed: Allergy & Precautions, H&P , NPO status , Patient's Chart, lab work & pertinent test results, reviewed documented beta blocker date and time   History of Anesthesia Complications Negative for: history of anesthetic complications  Airway Mallampati: II TM Distance: >3 FB Neck ROM: Full    Dental  (+) Teeth Intact and Dental Advisory Given   Pulmonary neg pulmonary ROS,  breath sounds clear to auscultation  Pulmonary exam normal       Cardiovascular hypertension, + CAD + dysrhythmias (s/p ablation) Atrial Fibrillation + pacemaker Rhythm:Irregular Rate:Normal  '14 TEE: EF 55-60%, valves OK   Neuro/Psych    GI/Hepatic negative GI ROS, Neg liver ROS,   Endo/Other  diabetes (glu 112), Well Controlled, Type 2, Oral Hypoglycemic AgentsHyperthyroidism Morbid obesity  Renal/GU negative Renal ROS     Musculoskeletal   Abdominal (+) + obese,   Peds  Hematology   Anesthesia Other Findings   Reproductive/Obstetrics                           Anesthesia Physical Anesthesia Plan  ASA: III  Anesthesia Plan: General   Post-op Pain Management:    Induction: Intravenous  Airway Management Planned: Oral ETT  Additional Equipment:   Intra-op Plan:   Post-operative Plan: Extubation in OR  Informed Consent: I have reviewed the patients History and Physical, chart, labs and discussed the procedure including the risks, benefits and alternatives for the proposed anesthesia with the patient or authorized representative who has indicated his/her understanding and acceptance.   Dental advisory given  Plan Discussed with: CRNA and Surgeon  Anesthesia Plan Comments: (Plan routine monitors, GETA)        Anesthesia Quick Evaluation

## 2012-12-23 NOTE — Op Note (Signed)
12/23/2012  10:00 AM  PATIENT:  Hailey [name]  67 y.o. female  PRE-OPERATIVE DIAGNOSIS:  Right L5-S1 Synovial cyst, lumbar spondylosis, lumbar degenerative disease, lumbago  POST-OPERATIVE DIAGNOSIS:  Right L5-S1 Synovial cyst, lumbar spondylosis, lumbar degenerative disc disease, lumbago  PROCEDURE:  Procedure(s): Right L5-S1 Laminotomy for resection of synovial cyst  SURGEON:  Surgeon(s): [name], MD Mariam Dollar, MD   ASSISTANTS: Donalee Citrin, M.D.  ANESTHESIA:   general  EBL:  Total I/O In: 1500 [I.V.:1500] Out: -   BLOOD ADMINISTERED:none  COUNT: Correct per nursing staff  DICTATION: Patient was brought to the operating room and placed under general endotracheal anesthesia. Patient was turned to prone position the lumbar region was prepped with Betadine soap and solution and draped in a sterile fashion. The midline was infiltrated with local anesthetic with epinephrine. Midline incision was made over the L5-S1 level and was carried down through the subcutaneous tissue to the lumbar fascia. The lumbar fascia was incised on the right side and the paraspinal muscles were dissected from the spinous processes and lamina in a subperiosteal fashion. An x-ray was taken and the L5-S1 intralaminar space was identified. The operating microscope was draped and brought into the field provided additional magnification, illumination, and visualization. Laminotomy was performed using the high-speed drill and Kerrison punches. We mobilized the ligament of flavum, and identified the synovial cyst. It had evidence of old hemorrhage within it. It was associated with significant facet arthropathy. The synovial cyst was removed in a piecemeal fashion along the thickened ligamentum flavum, and were able to decompress the thecal sac and the exiting right L5 and right S1 nerve roots. The synovial cyst was removed in total. Hemostasis was established with the use of bipolar cautery, and Gelfoam with  thrombin. We were able to remove the Gelfoam, the wound was irrigated with bacitracin solution, hemostasis was confirmed. Once the resection of the synovial cyst was completed and good decompression of the thecal sac and nerve had been achieved, and hemostasis established, we instilled 2 cc of fentanyl and 80 mg of Depo-Medrol into the epidural space. Deep fascia was closed with interrupted undyed 1 Vicryl sutures. Scarpa's fascia was closed with interrupted undyed 2-0 Vicryl sutures, in the subcutaneous and subcuticular layer were closed with interrupted inverted 2-0 undyed Vicryl sutures. The skin edges were approximated with Dermabond. Following surgery the patient was turned back to a supine position to be reversed from the anesthetic extubated and transferred to the recovery room for further care.   PLAN OF CARE: Admit for overnight observation  PATIENT DISPOSITION:  PACU - hemodynamically stable.   Delay start of Pharmacological VTE agent (>24hrs) due to surgical blood loss or risk of bleeding:  yes

## 2012-12-23 NOTE — Progress Notes (Signed)
Filed Vitals:   12/23/12 1137 12/23/12 1210 12/23/12 1635 12/23/12 1933  BP: 124/39 148/82 128/75 120/61  Pulse:  65 62 71  Temp: 97.7 F (36.5 C) 97.7 F (36.5 C) 98.2 F (36.8 C) 98.7 F (37.1 C)  TempSrc:    Oral  Resp:    16  SpO2:  96% 99% 94%    Patient resting comfortably in bed. Has been up and ambulating the hall. Has had difficulty with voiding, and has required in and out catheterization x2. Patient reports that she has consistently had difficulty voiding after having undergone anesthesia and surgery. She says it usually resolves after 24 hours or so. We'll continue to monitor voiding function and in and out catheterized as needed. Have encouraged ambulation. Wound clean and dry. Moving all 4 extremities well.  Plan: Continue to progress through postoperative recovery.  [name], MD 12/23/2012, 9:21 PM

## 2012-12-23 NOTE — Anesthesia Procedure Notes (Signed)
Procedure Name: Intubation Date/Time: 12/23/2012 9:03 AM Performed by: Brien Mates DOBSON Pre-anesthesia Checklist: Patient identified, Emergency Drugs available, Suction available, Patient being monitored and Timeout performed Patient Re-evaluated:Patient Re-evaluated prior to inductionOxygen Delivery Method: Circle system utilized Preoxygenation: Pre-oxygenation with 100% oxygen Intubation Type: IV induction Ventilation: Mask ventilation without difficulty and Oral airway inserted - appropriate to patient size Laryngoscope Size: Miller and 2 Grade View: Grade I Tube type: Oral Tube size: 7.5 mm Number of attempts: 1 Airway Equipment and Method: Stylet Placement Confirmation: ETT inserted through vocal cords under direct vision,  positive ETCO2 and breath sounds checked- equal and bilateral Secured at: 21 cm Tube secured with: Tape Dental Injury: Teeth and Oropharynx as per pre-operative assessment

## 2012-12-23 NOTE — Progress Notes (Signed)
UR COMPLETED  

## 2012-12-23 NOTE — Transfer of Care (Signed)
Immediate Anesthesia Transfer of Care Note  Patient: Hailey [name]  Procedure(s) Performed: Procedure(s) with comments: Right L5-S1 Laminotomy for resection of synovial cyst (Right) - Right Lumbar five-sacral one Laminotomy for resection of synovial cyst  Patient Location: PACU  Anesthesia Type:General  Level of Consciousness: awake  Airway & Oxygen Therapy: Patient Spontanous Breathing and Patient connected to face mask oxygen  Post-op Assessment: Report given to PACU RN and Post -op Vital signs reviewed and stable  Post vital signs: Reviewed and stable  Complications: No apparent anesthesia complications

## 2012-12-23 NOTE — H&P (Signed)
Subjective: Patient is a 67 y.o. female who is admitted for treatment of right-sided low back pain secondary to a right L5-S1 synovial cyst.  Patient develops symptoms about 4/2 months ago, with pain in the right side of her low back.  It has steadily worsened.  She's been treated with physical therapy for nearly 2 months, with no lasting relief.  She's been treated with 2 spinal injections which gave her no relief.  She was started on Norco 5/325, and developed difficulties with nausea, vomiting, constipation, and urinary retention.  She denies pain radiating into her lower extremities, but has worsening pain on the right side of her low back.  She has difficulty laying down, gently turning onto her right side.  She is particularly disabling pain with sitting, and cannot sit for much more and brief periods of time. Patient has a pacemaker, and therefore was started with CT scan which showed multilevel degenerative disc disease and spondylosis, a rudimentary S1-S2 disc, but the L5-S1 level there is some calcification of the anterior longitudinal ligament, but most significantly a moderately large right L5-S1 synovial cyst causing thecal sac and nerve root compression.  There is overlying facet arthropathy.  Patient is admitted now for a right L5-S1 lumbar laminotomy and resection of synovial cyst.  Patient stopped her Pradaxa  (for her atrial fibrillation) about 2 and half weeks ago.   Patient Active Problem List   Diagnosis Date Noted  . Atrial fibrillation 03/02/2012  . Bradycardia 03/02/2012  . Pulmonary nodule 02/18/2011  . Mediastinal lymphadenopathy 02/18/2011   Past Medical History  Diagnosis Date  . Hypertension   . Paroxysmal atrial fibrillation   . Diabetes mellitus   . Allergic rhinitis   . DJD (degenerative joint disease)   . Bradycardia     s/p PPM by Dr [name]  . CAD (coronary artery disease)     70% RCA stenosis, treated medically  . Polio     as a child  . Pacemaker   .  Hyperthyroidism   . Pneumonia     12  . Goiter     thyroid    Past Surgical History  Procedure Laterality Date  . Cholecystectomy    . Total abdominal hysterectomy    . Pacemaker insertion  12/03/10    SJM Accent DR RF implanted by Dr [name]  . Arm surgery d/t fx Right 96  . Tee without cardioversion N/A 07/30/2012    Procedure: TRANSESOPHAGEAL ECHOCARDIOGRAM (TEE);  Surgeon: [name], MD;  Location: [facility] ENDOSCOPY;  Service: Cardiovascular;  Laterality: N/A;  h&p in file-[facility]  . Atrial fibrillation ablation  07/31/2012    PVI by Dr [name]    Prescriptions prior to admission  Medication Sig Dispense Refill  . Calcium Carbonate-Vitamin D (CALCIUM 600+D) 600-400 MG-UNIT per tablet Take 1 tablet by mouth daily.       . Cholecalciferol (VITAMIN D3) 1000 UNITS CAPS Take 1 capsule by mouth daily.        . colesevelam (WELCHOL) 625 MG tablet Take 1,250 mg by mouth 2 (two) times daily with a meal.       . diltiazem (DILACOR XR) 120 MG 24 hr capsule Take 120 mg by mouth daily.      Marland Kitchen dronedarone (MULTAQ) 400 MG tablet Take 400 mg by mouth 2 (two) times daily with a meal.      . hydrochlorothiazide (MICROZIDE) 12.5 MG capsule Take 12.5 mg by mouth daily.       Marland Kitchen HYDROcodone-acetaminophen (NORCO/VICODIN) 5-325  MG per tablet Take 0.5-1 tablets by mouth every 4 (four) hours as needed for pain.      . metoprolol (LOPRESSOR) 50 MG tablet Take 50 mg by mouth 2 (two) times daily.       . pantoprazole (PROTONIX) 40 MG tablet Take 40 mg by mouth daily.       . polyethylene glycol powder (GLYCOLAX/MIRALAX) powder Take 17 g by mouth daily.      Marland Kitchen propylthiouracil (PTU) 50 MG tablet Take 50-100 mg by mouth See admin instructions. Take 2 tabs in the AM and 1 tab in the PM      . saxagliptin HCl (ONGLYZA) 5 MG TABS tablet Take 5 mg by mouth daily.      . valsartan (DIOVAN) 80 MG tablet Take 80 mg by mouth daily.      Marland Kitchen acetaminophen (TYLENOL) 500 MG tablet Take 1,000 mg by mouth every 6 (six) hours as  needed for pain.      . dabigatran (PRADAXA) 150 MG CAPS Take 150 mg by mouth every 12 (twelve) hours.      . ondansetron (ZOFRAN) 8 MG tablet Take 1 tablet (8 mg total) by mouth every 8 (eight) hours as needed for nausea.  20 tablet  0   Allergies  Allergen Reactions  . Codeine Nausea And Vomiting  . Demerol Nausea And Vomiting  . Ketorolac Other (See Comments)    "pt told to never take this med"    History  Substance Use Topics  . Smoking status: Never Smoker   . Smokeless tobacco: Not on file  . Alcohol Use: No    Family History  Problem Relation Age of Onset  . Heart disease Sister   . Breast cancer Sister   . Breast cancer Sister      Review of Systems A comprehensive review of systems was negative.  Objective: Vital signs in last 24 hours:    EXAM: patient is a well-developed well-nourished white female in no acute distress. Lungs are close to auscultation.  She has symmetrical respiratory excursion.  Heart has a regular rate and rhythm.  There is a normal S1 and S2, there is no murmur. Abdomen is soft, nondistended, bowel sounds are present. Extremity examination shows no clubbing, cyanosis, or edema.musculoskeletal examination shows tenderness over the right paralumbar region, but none over the lumbar spinous processes or left paralumbar region.  She is limited in flexion at about 60, limited in extension at about 10.  Straight leg raising is negative bilaterally.  Neurologic examination shows 5/5 strength in iliopsoas, quadriceps, dorsiflexion, plantar flexor bilaterally, as well as in the left EHL, but the right EHL is 4/5.  Sensation is intact to pinprick in the distal lower extremities.  Reflexes show the left quadriceps one, right questions is trace.  The tenderness is absent bilaterally.  Toes are downgoing bilaterally.  She stands and walks somewhat restless leg, and cannot sit for very long because of pain.  Data Review:CBC    Component Value Date/Time   WBC  11.9* 12/18/2012 1352   RBC 4.72 12/18/2012 1352   HGB 12.9 12/18/2012 1352   HCT 39.8 12/18/2012 1352   PLT 299 12/18/2012 1352   MCV 84.3 12/18/2012 1352   MCH 27.3 12/18/2012 1352   MCHC 32.4 12/18/2012 1352   RDW 14.7 12/18/2012 1352   LYMPHSABS 1.9 12/05/2012 1529   MONOABS 1.2* 12/05/2012 1529   EOSABS 0.0 12/05/2012 1529   BASOSABS 0.0 12/05/2012 1529  BMET    Component Value Date/Time   NA 138 12/18/2012 1352   K 3.8 12/18/2012 1352   CL 101 12/18/2012 1352   CO2 26 12/18/2012 1352   GLUCOSE 105* 12/18/2012 1352   BUN 13 12/18/2012 1352   CREATININE 0.78 12/18/2012 1352   CALCIUM 9.5 12/18/2012 1352   GFRNONAA 85* 12/18/2012 1352   GFRAA >90 12/18/2012 1352     Assessment/Plan: Patient with disabling right-sided back pain secondary to the right L5-S1 synovial cyst, with significant thecal sac and nerve root compression.  There is underlying degenerative disease and spondylosis.  Patient is admitted for a right L5-S1 lumbar laminotomy and resection of synovial cyst.  I discussed the nature of the patient's condition, an option for treatment and care including alternatives to surgery.  We discussed the nature surgery, and its associated risks, including risks of infection, bleeding, possibly effusion, the risk of nerve dysfunction with pain, weakness, numbness, and wrists of CSF leakage and possibly for further surgery.  We've also discussed the risk of instability despite a possible need for further surgery, and anesthetic risks of myocardial infarction, stroke, pneumonia, and death.understand all this patient is wished to proceed with surgery and is admitted for such.   [name], MD 12/23/2012 6:37 AM

## 2012-12-24 ENCOUNTER — Encounter (HOSPITAL_COMMUNITY): Payer: Self-pay | Admitting: Neurosurgery

## 2012-12-24 LAB — GLUCOSE, CAPILLARY
Glucose-Capillary: 147 mg/dL — ABNORMAL HIGH (ref 70–99)
Glucose-Capillary: 132 mg/dL — ABNORMAL HIGH (ref 70–99)

## 2012-12-24 MED ORDER — TRAMADOL HCL 50 MG PO TABS: 50.0000 mg | ORAL_TABLET | Freq: Four times a day (QID) | ORAL | Status: DC | PRN

## 2012-12-24 NOTE — Progress Notes (Signed)
Filed Vitals:   12/23/12 1933 12/24/12 0002 12/24/12 0402 12/24/12 0824  BP: 120/61 103/59 118/69 106/56  Pulse: 71 60 66 60  Temp: 98.7 F (37.1 C) 98.4 F (36.9 C) 98.4 F (36.9 C) 97.6 F (36.4 C)  TempSrc: Oral Oral Oral   Resp: 16 16 18 16   SpO2: 94% 95% 98% 93%    Patient up and ambulating in halls. Wound clean and dry. Has been voiding, after recent voided of 175 cc, bladder scan revealed a PVR of 355 cc. Patient had voiding difficulties in the past and is followed at [facility]. Encouraged to continue to increase ambulation. Will continue to monitor voiding function.  Plan: Continued to progress thru postoperative recovery.  [name], MD 12/24/2012, 9:10 AM

## 2012-12-24 NOTE — Discharge Summary (Signed)
Physician Discharge Summary  Patient ID: Hailey [name] MRN: [id number] DOB/AGE: January 15, 1946 67 y.o.  Admit date: 12/23/2012 Discharge date: 12/24/2012  Admission Diagnoses:  Right L5-S1 Synovial cyst, lumbar spondylosis, lumbar degenerative disease, lumbago  Discharge Diagnoses:  Right L5-S1 Synovial cyst, lumbar spondylosis, lumbar degenerative disease, lumbago  Discharged Condition: good  Hospital Course: Patient was admitted, underwent a right L5-S1 lumbar laminotomy and resection of synovial cyst. Postoperatively she has done well. Her right-sided low-back pain is lessened since her surgery. She is up and ambulate in the halls. She has had some mild urinary retention, which has been gradually improving. Her wound is healing well. She is being discharged home with instructions regarding wound care and activities, she is to return for followup with me in 3 weeks. If she has further difficulties with voiding function she is to return to her urologist at [facility].  Discharge Exam: Blood pressure 111/66, pulse 85, temperature 97.4 F (36.3 C), temperature source Oral, resp. rate 20, SpO2 95.00%.  Disposition: Home   Future Appointments Provider Department Dept Phone   12/28/2012 6:25 AM [facility] [employer] Ginette Otto [phone]   02/12/2013 2:15 PM Hillis Range, MD [facility] Main Office [place]) [phone]       Medication List         acetaminophen 500 MG tablet  Commonly known as:  TYLENOL  Take 1,000 mg by mouth every 6 (six) hours as needed for pain.     CALCIUM 600+D 600-400 MG-UNIT per tablet  Generic drug:  Calcium Carbonate-Vitamin D  Take 1 tablet by mouth daily.     dabigatran 150 MG Caps  Commonly known as:  PRADAXA  Take 150 mg by mouth every 12 (twelve) hours.     diltiazem 120 MG 24 hr capsule  Commonly known as:  DILACOR XR  Take 120 mg by mouth daily.     dronedarone 400 MG tablet  Commonly  known as:  MULTAQ  Take 400 mg by mouth 2 (two) times daily with a meal.     hydrochlorothiazide 12.5 MG capsule  Commonly known as:  MICROZIDE  Take 12.5 mg by mouth daily.     HYDROcodone-acetaminophen 5-325 MG per tablet  Commonly known as:  NORCO/VICODIN  Take 0.5-1 tablets by mouth every 4 (four) hours as needed for pain.     metoprolol 50 MG tablet  Commonly known as:  LOPRESSOR  Take 50 mg by mouth 2 (two) times daily.     ondansetron 8 MG tablet  Commonly known as:  ZOFRAN  Take 1 tablet (8 mg total) by mouth every 8 (eight) hours as needed for nausea.     pantoprazole 40 MG tablet  Commonly known as:  PROTONIX  Take 40 mg by mouth daily.     polyethylene glycol powder powder  Commonly known as:  GLYCOLAX/MIRALAX  Take 17 g by mouth daily.     propylthiouracil 50 MG tablet  Commonly known as:  PTU  Take 50-100 mg by mouth See admin instructions. Take 2 tabs in the AM and 1 tab in the PM     saxagliptin HCl 5 MG Tabs tablet  Commonly known as:  ONGLYZA  Take 5 mg by mouth daily.     traMADol 50 MG tablet  Commonly known as:  ULTRAM  Take 1 tablet (50 mg total) by mouth every 6 (six) hours as needed for pain.     valsartan 80 MG tablet  Commonly known as:  DIOVAN  Take 80 mg by mouth daily.     Vitamin D3 1000 UNITS Caps  Take 1 capsule by mouth daily.     WELCHOL 625 MG tablet  Generic drug:  colesevelam  Take 1,250 mg by mouth 2 (two) times daily with a meal.         Signed: [name], MD 12/24/2012, 2:25 PM

## 2012-12-26 ENCOUNTER — Other Ambulatory Visit: Payer: Self-pay | Admitting: Cardiovascular Disease

## 2012-12-28 DIAGNOSIS — R339 Retention of urine, unspecified: Secondary | ICD-10-CM | POA: Diagnosis not present

## 2012-12-28 DIAGNOSIS — N390 Urinary tract infection, site not specified: Secondary | ICD-10-CM | POA: Diagnosis not present

## 2013-01-20 ENCOUNTER — Encounter: Payer: Self-pay | Admitting: Cardiovascular Disease

## 2013-01-20 ENCOUNTER — Ambulatory Visit (INDEPENDENT_AMBULATORY_CARE_PROVIDER_SITE_OTHER): Payer: Medicare Other | Admitting: Cardiovascular Disease

## 2013-01-20 VITALS — BP 98/60 | HR 60 | Ht 59.0 in | Wt 156.2 lb

## 2013-01-20 DIAGNOSIS — I4891 Unspecified atrial fibrillation: Secondary | ICD-10-CM

## 2013-01-20 LAB — REMOTE PACEMAKER DEVICE
AL IMPEDENCE PM: 350 Ohm
ATRIAL PACING PM: 88
BAMS-0003: 70 {beats}/min
BATTERY VOLTAGE: 2.95 V

## 2013-01-20 NOTE — Patient Instructions (Addendum)
Your physician recommends that you schedule a follow-up appointment in: One year.  

## 2013-01-26 ENCOUNTER — Encounter: Payer: Medicare Other | Admitting: Cardiology

## 2013-01-30 ENCOUNTER — Other Ambulatory Visit: Payer: Self-pay | Admitting: Cardiovascular Disease

## 2013-02-02 ENCOUNTER — Other Ambulatory Visit: Payer: Self-pay | Admitting: *Deleted

## 2013-02-02 NOTE — Telephone Encounter (Signed)
Rx was sent to pharmacy electronically. 

## 2013-02-05 ENCOUNTER — Other Ambulatory Visit: Payer: Self-pay

## 2013-02-05 ENCOUNTER — Ambulatory Visit (INDEPENDENT_AMBULATORY_CARE_PROVIDER_SITE_OTHER): Payer: Medicare Other | Admitting: Cardiology

## 2013-02-05 ENCOUNTER — Encounter: Payer: Self-pay | Admitting: Cardiology

## 2013-02-05 VITALS — Ht 59.0 in | Wt 156.8 lb

## 2013-02-05 DIAGNOSIS — I498 Other specified cardiac arrhythmias: Secondary | ICD-10-CM

## 2013-02-05 DIAGNOSIS — Z950 Presence of cardiac pacemaker: Secondary | ICD-10-CM

## 2013-02-05 DIAGNOSIS — D485 Neoplasm of uncertain behavior of skin: Secondary | ICD-10-CM | POA: Diagnosis not present

## 2013-02-05 DIAGNOSIS — I4891 Unspecified atrial fibrillation: Secondary | ICD-10-CM

## 2013-02-05 DIAGNOSIS — R001 Bradycardia, unspecified: Secondary | ICD-10-CM

## 2013-02-05 DIAGNOSIS — D2360 Other benign neoplasm of skin of unspecified upper limb, including shoulder: Secondary | ICD-10-CM | POA: Diagnosis not present

## 2013-02-05 DIAGNOSIS — L905 Scar conditions and fibrosis of skin: Secondary | ICD-10-CM | POA: Diagnosis not present

## 2013-02-05 LAB — PACEMAKER DEVICE OBSERVATION
AL AMPLITUDE: 3 mv
AL IMPEDENCE PM: 410 Ohm
AL THRESHOLD: 0.75 V
BAMS-0003: 70 {beats}/min
RV LEAD AMPLITUDE: 7.2 mv

## 2013-02-05 NOTE — Progress Notes (Signed)
ELECTROPHYSIOLOGY OFFICE NOTE  Patient ID: Hailey [name] MRN: [id number], DOB/AGE: Nov 07, 1945   Date of Visit: 02/05/2013  Primary Physician: [name], MD Primary Cardiologist / EP: [name], MD / [name], MD Reason for Visit: Atrial fibrillation  History of Present Illness  Hailey [name] is a 67 y.o. female with PAF s/p AF ablation Feb 2014 and bradycardia s/p PPM implant who presents today for routine electrophysiology followup. Since last being seen in our clinic, she reports she is doing well and has no complaints. She denies chest pain or shortness of breath. She denies palpitations, dizziness, near syncope or syncope. She denies LE swelling, orthopnea, PND or recent weight gain. She is compliant and tolerating medications without difficulty.  Past Medical History Past Medical History  Diagnosis Date  . Hypertension   . Paroxysmal atrial fibrillation   . Diabetes mellitus   . Allergic rhinitis   . DJD (degenerative joint disease)   . Bradycardia     s/p PPM by Dr [name]  . CAD (coronary artery disease)     70% RCA stenosis, treated medically  . Polio     as a child  . Pacemaker   . Hyperthyroidism   . Pneumonia     12  . Goiter     thyroid    Past Surgical History Past Surgical History  Procedure Laterality Date  . Cholecystectomy    . Total abdominal hysterectomy    . Pacemaker insertion  12/03/10    SJM Accent DR RF implanted by Dr [name]  . Arm surgery d/t fx Right 96  . Tee without cardioversion N/A 07/30/2012    Procedure: TRANSESOPHAGEAL ECHOCARDIOGRAM (TEE);  Surgeon: [name], MD;  Location: [facility] ENDOSCOPY;  Service: Cardiovascular;  Laterality: N/A;  h&p in file-[facility]  . Atrial fibrillation ablation  07/31/2012    PVI by Dr [name]  . Lumbar laminectomy/decompression microdiscectomy Right 12/23/2012    Procedure: Right L5-S1 Laminotomy for resection of synovial cyst;  Surgeon: [name], MD;  Location: [facility] NEURO ORS;  Service: Neurosurgery;   Laterality: Right;  Right Lumbar five-sacral one Laminotomy for resection of synovial cyst    Allergies/Intolerances Allergies  Allergen Reactions  . Codeine Nausea And Vomiting  . Demerol Nausea And Vomiting  . Ketorolac Other (See Comments)    "pt told to never take this med"   Current Home Medications Current Outpatient Prescriptions  Medication Sig Dispense Refill  . acetaminophen (TYLENOL) 500 MG tablet Take 1,000 mg by mouth every 6 (six) hours as needed for pain.      . Calcium Carbonate-Vitamin D (CALCIUM 600+D) 600-400 MG-UNIT per tablet Take 1 tablet by mouth daily.       . Cholecalciferol (VITAMIN D3) 1000 UNITS CAPS Take 1 capsule by mouth daily.        . colesevelam (WELCHOL) 625 MG tablet Take 1,250 mg by mouth 2 (two) times daily with a meal.       . dabigatran (PRADAXA) 150 MG CAPS Take 150 mg by mouth every 12 (twelve) hours.      Marland Kitchen diltiazem (DILACOR XR) 120 MG 24 hr capsule Take 120 mg by mouth daily.      Marland Kitchen dronedarone (MULTAQ) 400 MG tablet Take 400 mg by mouth 2 (two) times daily with a meal.      . hydrochlorothiazide (MICROZIDE) 12.5 MG capsule Take 12.5 mg by mouth daily.       . metoprolol (LOPRESSOR) 50 MG tablet TAKE ONE TABLET BY MOUTH TWICE  DAILY  60 tablet  11  . pantoprazole (PROTONIX) 40 MG tablet Take 40 mg by mouth daily.       Marland Kitchen propylthiouracil (PTU) 50 MG tablet Take 50-100 mg by mouth See admin instructions. Take 2 tabs in the AM and 1 tab in the PM      . saxagliptin HCl (ONGLYZA) 5 MG TABS tablet Take 5 mg by mouth daily.      . valsartan (DIOVAN) 80 MG tablet Take 80 mg by mouth daily.       No current facility-administered medications for this visit.   Social History Social History  . Marital Status: Married   Occupational History  . housewife    Social History Main Topics  . Smoking status: Never Smoker   . Smokeless tobacco: No  . Alcohol Use: No  . Drug Use: No   Review of Systems General: No chills, fever, night sweats or  weight changes Cardiovascular: No chest pain, dyspnea on exertion, edema, orthopnea, palpitations, paroxysmal nocturnal dyspnea Dermatological: No rash, lesions or masses Respiratory: No cough, dyspnea Urologic: No hematuria, dysuria Abdominal: No nausea, vomiting, diarrhea, bright red blood per rectum, melena, or hematemesis Neurologic: No visual changes, weakness, changes in mental status All other systems reviewed and are otherwise negative except as noted above.  Physical Exam Vitals: Height 4\' 11"  (1.499 m), weight 156 lb 12.8 oz (71.124 kg).  General: Well developed, well appearing 67 y.o. female in no acute distress. HEENT: Normocephalic, atraumatic. EOMs intact. Sclera nonicteric. Oropharynx clear.  Neck: Supple without bruits. No JVD. Lungs: Respirations regular and unlabored, CTA bilaterally. No wheezes, rales or rhonchi. Heart: RRR. S1, S2 present. No murmurs, rub, S3 or S4. Abdomen: Soft, non-distended.  Extremities: No clubbing, cyanosis or edema. PT/Radials 2+ and equal bilaterally. Psych: Normal affect. Neuro: Alert and oriented X 3. Moves all extremities spontaneously.   Diagnostics Device interrogation - Normal device function. Thresholds, sensing, impedances consistent with previous measurements. Device programmed to maximize longevity. 211 mode switch episodes, longest 21 hours, AT/AF burden 10% of time. 1 high ventricular rate noted, EGM consistent with AF w/RVR. Device programmed at appropriate safety margins. Histogram distribution appropriate for patient activity level. Device programmed to optimize intrinsic conduction. Estimated longevity 6.5 - 7.2 years.  Assessment and Plan 1. PAF - stable; AF burden improved since AF ablation (down from 25% prior to ablation) - continue Multaq as previously directed by Dr. [name] and Dr. [name] - continue Pradaxa for stroke risk reduction - follow-up with Dr. [name] in 3 months 2. Bradycardia s/p PPM implant - normal  device function - no programming changes made - continue routine PPM follow-up every 3 months  Signed, Hailey Pinzon, PA-C 02/05/2013, 5:07 PM

## 2013-02-05 NOTE — Patient Instructions (Addendum)
Your physician recommends that you schedule a follow-up appointment in: 3 MONTHS PACEMAKER CHECK FOLLOW UP WITH DR. ALLRED FOR A.FIB  Your physician recommends that you continue on your current medications as directed. Please refer to the Current Medication list given to you today.

## 2013-02-08 ENCOUNTER — Other Ambulatory Visit: Payer: Self-pay | Admitting: *Deleted

## 2013-02-08 MED ORDER — DRONEDARONE HCL 400 MG PO TABS: 400.0000 mg | ORAL_TABLET | Freq: Two times a day (BID) | ORAL | Status: DC

## 2013-02-08 NOTE — Telephone Encounter (Signed)
Rx was sent to pharmacy electronically. 

## 2013-02-12 ENCOUNTER — Encounter: Payer: Medicare Other | Admitting: Internal Medicine

## 2013-02-15 NOTE — Progress Notes (Signed)
Patient ID: Hailey [name], female   DOB: 12-07-1945, 67 y.o.   MRN: [id number]     Reason for office visit Atrial fibrillation, coronary disease, tachycardia-bradycardia syndrome, pacemaker followup  [name] is now roughly 6 months status post endocardial radiofrequency ablation for symptomatic atrial fibrillation. From a clinical standpoint there appears to be a reduction in the prevalence of atrial fibrillation. This is confirmed by her pacemaker.   There was a significant decrease in the frequency of atrial fibrillation until just the last 2 weeks when the prevalence of arrhythmia seems to be increasing again. Chest never been arrhythmia free and her cardiac light grams clearly show episodes of atrial fibrillation as well as some atrial tachycardia. Over the last 2 months her afib burden has been 8.8% (this is roughly half of what it had been before ablation).  She has no complaints of angina, shortness of breath, syncope but is still occasionally aware of the palpitations. She had removal of a cyst from her back without any problems.  Her ECG today shows atrial paced ventricular sensed rhythm with a nonspecific ST-T wave abnormality. She is on Pradaxa for anticoagulation.    Allergies  Allergen Reactions  . Codeine Nausea And Vomiting  . Demerol Nausea And Vomiting  . Ketorolac Other (See Comments)    "pt told to never take this med"    Current Outpatient Prescriptions  Medication Sig Dispense Refill  . acetaminophen (TYLENOL) 500 MG tablet Take 1,000 mg by mouth every 6 (six) hours as needed for pain.      . Calcium Carbonate-Vitamin D (CALCIUM 600+D) 600-400 MG-UNIT per tablet Take 1 tablet by mouth daily.       . Cholecalciferol (VITAMIN D3) 1000 UNITS CAPS Take 1 capsule by mouth daily.        . colesevelam (WELCHOL) 625 MG tablet Take 1,250 mg by mouth 2 (two) times daily with a meal.       . dabigatran (PRADAXA) 150 MG CAPS Take 150 mg by mouth every 12 (twelve) hours.        Marland Kitchen diltiazem (DILACOR XR) 120 MG 24 hr capsule Take 120 mg by mouth daily.      . hydrochlorothiazide (MICROZIDE) 12.5 MG capsule Take 12.5 mg by mouth daily.       . pantoprazole (PROTONIX) 40 MG tablet Take 40 mg by mouth daily.       Marland Kitchen propylthiouracil (PTU) 50 MG tablet Take 50-100 mg by mouth See admin instructions. Take 2 tabs in the AM and 1 tab in the PM      . saxagliptin HCl (ONGLYZA) 5 MG TABS tablet Take 5 mg by mouth daily.      . valsartan (DIOVAN) 80 MG tablet Take 80 mg by mouth daily.      Marland Kitchen dronedarone (MULTAQ) 400 MG tablet Take 1 tablet (400 mg total) by mouth 2 (two) times daily with a meal.  60 tablet  11  . metoprolol (LOPRESSOR) 50 MG tablet TAKE ONE TABLET BY MOUTH TWICE DAILY  60 tablet  11   No current facility-administered medications for this visit.    Past Medical History  Diagnosis Date  . Hypertension   . Paroxysmal atrial fibrillation   . Diabetes mellitus   . Allergic rhinitis   . DJD (degenerative joint disease)   . Bradycardia     s/p PPM by Dr [name]  . CAD (coronary artery disease)     70% RCA stenosis, treated medically  . Polio  as a child  . Pacemaker   . Hyperthyroidism   . Pneumonia     12  . Goiter     thyroid    Past Surgical History  Procedure Laterality Date  . Cholecystectomy    . Total abdominal hysterectomy    . Pacemaker insertion  12/03/10    SJM Accent DR RF implanted by Dr [name]  . Arm surgery d/t fx Right 96  . Tee without cardioversion N/A 07/30/2012    Procedure: TRANSESOPHAGEAL ECHOCARDIOGRAM (TEE);  Surgeon: [name], MD;  Location: [facility] ENDOSCOPY;  Service: Cardiovascular;  Laterality: N/A;  h&p in file-[facility]  . Atrial fibrillation ablation  07/31/2012    PVI by Dr [name]  . Lumbar laminectomy/decompression microdiscectomy Right 12/23/2012    Procedure: Right L5-S1 Laminotomy for resection of synovial cyst;  Surgeon: [name], MD;  Location: [facility] NEURO ORS;  Service: Neurosurgery;  Laterality:  Right;  Right Lumbar five-sacral one Laminotomy for resection of synovial cyst    Family History  Problem Relation Age of Onset  . Heart disease Sister   . Breast cancer Sister   . Breast cancer Sister     History   Social History  . Marital Status: Married    Spouse Name: Loistine Chance    Number of Children: N/A  . Years of Education: N/A   Occupational History  . housewife    Social History Main Topics  . Smoking status: Never Smoker   . Smokeless tobacco: Not on file  . Alcohol Use: No  . Drug Use: No  . Sexual Activity: Not on file   Other Topics Concern  . Not on file   Social History Narrative   Lives in [place].  Retired.    Review of systems: The patient specifically denies any chest pain at rest or with exertion, dyspnea at rest or with exertion, orthopnea, paroxysmal nocturnal dyspnea, syncope, focal neurological deficits, intermittent claudication, lower extremity edema, unexplained weight gain, cough, hemoptysis or wheezing. Occasional palpitations still occur  The patient also denies abdominal pain, nausea, vomiting, dysphagia, diarrhea, constipation, polyuria, polydipsia, dysuria, hematuria, frequency, urgency, abnormal bleeding or bruising, fever, chills, unexpected weight changes, mood swings, change in skin or hair texture, change in voice quality, auditory or visual problems, allergic reactions or rashes, new musculoskeletal complaints other than usual "aches and pains".   PHYSICAL EXAM BP 98/60  Pulse 60  Ht 4\' 11"  (1.499 m)  Wt 156 lb 3.2 oz (70.852 kg)  BMI 31.53 kg/m2  General: Alert, oriented x3, no distress Head: no evidence of trauma, PERRL, EOMI, no exophtalmos or lid lag, no myxedema, no xanthelasma; normal ears, nose and oropharynx Neck: normal jugular venous pulsations and no hepatojugular reflux; brisk carotid pulses without delay and no carotid bruits Chest: clear to auscultation, no signs of consolidation by percussion or palpation,  normal fremitus, symmetrical and full respiratory excursions; healthy left subclavian pacemaker site Cardiovascular: normal position and quality of the apical impulse, regular rhythm, normal first and second heart sounds, no murmurs, rubs or gallops Abdomen: no tenderness or distention, no masses by palpation, no abnormal pulsatility or arterial bruits, normal bowel sounds, no hepatosplenomegaly Extremities: no clubbing, cyanosis or edema; 2+ radial, ulnar and brachial pulses bilaterally; 2+ right femoral, posterior tibial and dorsalis pedis pulses; 2+ left femoral, posterior tibial and dorsalis pedis pulses; no subclavian or femoral bruits Neurological: grossly nonfocal   EKG: Atrial paced ventricular sensed, ST segment depression with T-wave inversions seen primarily in I, II, V4-V6.  Similar repolarization amenities haven't seen waxing and waning in the past.  Pacemaker interrogation shows 88% atrial pacing and 26% ventricular pacing. Consider possible memory T-wave changes.  Lipid Panel     Component Value Date/Time   CHOL 94 11/24/2010 0837   TRIG 51 11/24/2010 0837   HDL 53 11/24/2010 0837   CHOLHDL 1.8 11/24/2010 0837   VLDL 10 11/24/2010 0837   LDLCALC 31 11/24/2010 0837    BMET    Component Value Date/Time   NA 138 12/18/2012 1352   K 3.8 12/18/2012 1352   CL 101 12/18/2012 1352   CO2 26 12/18/2012 1352   GLUCOSE 105* 12/18/2012 1352   BUN 13 12/18/2012 1352   CREATININE 0.78 12/18/2012 1352   CALCIUM 9.5 12/18/2012 1352   GFRNONAA 85* 12/18/2012 1352   GFRAA >90 12/18/2012 1352     ASSESSMENT AND PLAN  She has seen benefit from the ablation procedure but unfortunately still has a high enough AF burden that we cannot contemplate stopping her anticoagulation. She is due to followup with Dr. [name] again in the near future.  She does not have any  Symptoms reminiscent of her coronary event. The ECG changes are noted but have possible alternative explanations.  She has normal  pacemaker function and no permanent adjustments were made to the device. Please see separate device note. (7/26)   Orders Placed This Encounter  Procedures  . EKG 12-Lead   No orders of the defined types were placed in this encounter.    Junious Silk, MD, [facility] [phone] office [phone] pager

## 2013-02-17 ENCOUNTER — Encounter: Payer: Self-pay | Admitting: Internal Medicine

## 2013-02-19 DIAGNOSIS — Z23 Encounter for immunization: Secondary | ICD-10-CM | POA: Diagnosis not present

## 2013-02-23 DIAGNOSIS — R339 Retention of urine, unspecified: Secondary | ICD-10-CM | POA: Diagnosis not present

## 2013-02-23 DIAGNOSIS — N390 Urinary tract infection, site not specified: Secondary | ICD-10-CM | POA: Diagnosis not present

## 2013-03-04 ENCOUNTER — Encounter: Payer: Self-pay | Admitting: Cardiovascular Disease

## 2013-03-04 DIAGNOSIS — R609 Edema, unspecified: Secondary | ICD-10-CM | POA: Diagnosis not present

## 2013-03-04 DIAGNOSIS — I10 Essential (primary) hypertension: Secondary | ICD-10-CM | POA: Diagnosis not present

## 2013-03-04 DIAGNOSIS — E119 Type 2 diabetes mellitus without complications: Secondary | ICD-10-CM | POA: Diagnosis not present

## 2013-03-04 DIAGNOSIS — E0590 Thyrotoxicosis, unspecified without thyrotoxic crisis or storm: Secondary | ICD-10-CM | POA: Diagnosis not present

## 2013-03-05 DIAGNOSIS — R339 Retention of urine, unspecified: Secondary | ICD-10-CM | POA: Diagnosis not present

## 2013-03-11 DIAGNOSIS — E0789 Other specified disorders of thyroid: Secondary | ICD-10-CM | POA: Diagnosis not present

## 2013-03-11 DIAGNOSIS — E119 Type 2 diabetes mellitus without complications: Secondary | ICD-10-CM | POA: Diagnosis not present

## 2013-03-11 DIAGNOSIS — I10 Essential (primary) hypertension: Secondary | ICD-10-CM | POA: Diagnosis not present

## 2013-03-17 DIAGNOSIS — J90 Pleural effusion, not elsewhere classified: Secondary | ICD-10-CM | POA: Diagnosis not present

## 2013-03-17 DIAGNOSIS — J811 Chronic pulmonary edema: Secondary | ICD-10-CM | POA: Diagnosis not present

## 2013-03-18 DIAGNOSIS — R609 Edema, unspecified: Secondary | ICD-10-CM | POA: Diagnosis not present

## 2013-03-18 DIAGNOSIS — I4891 Unspecified atrial fibrillation: Secondary | ICD-10-CM | POA: Diagnosis not present

## 2013-03-18 DIAGNOSIS — E119 Type 2 diabetes mellitus without complications: Secondary | ICD-10-CM | POA: Diagnosis not present

## 2013-03-19 ENCOUNTER — Telehealth: Payer: Self-pay | Admitting: Cardiovascular Disease

## 2013-03-19 NOTE — Telephone Encounter (Signed)
Pt was put on Lasix by Dr. Selena Batten and is experiencing side effects and she would like to know what is normal and what is not normal. She wanted to change her appointment with Dr. Salena Saner but when I told her the only thing that we had was a PA she just wanted a phone call instead of a visit

## 2013-03-19 NOTE — Telephone Encounter (Signed)
Patient called regarding new medication Dr. Selena Batten prescribe for LE edema - furosemide 20mg  daily, discontinued hydrochlorothiazide 12.5mg  daily. Was taking furosemide 20mg  QD until 10/15 at f/up with Dr. Selena Batten and edema had not lessened so this medication was increased to an additional 20mg  on MWF. Patient reports that she has had SOB since she first noticed the swelling, specifically DOE (walking up stairs) and that Dr. Selena Batten told her it may be heart failure symptoms and was instructed to f/up with Dr. [name] for heart failure eval (patient has appmt 03/24/13). RN informed patient that the medicine Dr. Selena Batten had ordered should lessen her swelling and SOB, to keep her appmt with Dr. Salena Saner on 10/22 and to bring her lab work that Dr. Selena Batten had ordered. Patient stated that she has a f/up with Dr. Selena Batten 11/3 and he is planning to re-check lab work at this time. Instructed patient to take medications as prescribed by Dr. Selena Batten, as today 10/17 is the first dose of 40mg  of furosemide, and to monitor SOB & edema over weekend and to call our office back if symptoms have not improved. Patient agreed with plan & verbalized understanding.

## 2013-03-24 ENCOUNTER — Encounter: Payer: Self-pay | Admitting: Cardiovascular Disease

## 2013-03-24 ENCOUNTER — Ambulatory Visit (INDEPENDENT_AMBULATORY_CARE_PROVIDER_SITE_OTHER): Payer: Medicare Other | Admitting: Cardiovascular Disease

## 2013-03-24 VITALS — BP 106/60 | HR 71 | Ht <= 58 in | Wt 163.1 lb

## 2013-03-24 DIAGNOSIS — R0989 Other specified symptoms and signs involving the circulatory and respiratory systems: Secondary | ICD-10-CM

## 2013-03-24 DIAGNOSIS — R0609 Other forms of dyspnea: Secondary | ICD-10-CM | POA: Diagnosis not present

## 2013-03-24 DIAGNOSIS — I4891 Unspecified atrial fibrillation: Secondary | ICD-10-CM

## 2013-03-24 DIAGNOSIS — R600 Localized edema: Secondary | ICD-10-CM

## 2013-03-24 DIAGNOSIS — Z950 Presence of cardiac pacemaker: Secondary | ICD-10-CM

## 2013-03-24 DIAGNOSIS — R609 Edema, unspecified: Secondary | ICD-10-CM

## 2013-03-24 DIAGNOSIS — R0600 Dyspnea, unspecified: Secondary | ICD-10-CM

## 2013-03-24 LAB — PACEMAKER DEVICE OBSERVATION

## 2013-03-24 MED ORDER — FUROSEMIDE 20 MG PO TABS: 40.0000 mg | ORAL_TABLET | Freq: Every day | ORAL | Status: DC

## 2013-03-24 MED ORDER — FUROSEMIDE 40 MG PO TABS: 40.0000 mg | ORAL_TABLET | Freq: Every day | ORAL | Status: DC

## 2013-03-24 NOTE — Patient Instructions (Addendum)
STOP Multaq.  Increase Furosemide to 40mg  daily.  Your physician has requested that you have an echocardiogram. Echocardiography is a painless test that uses sound waves to create images of your heart. It provides your doctor with information about the size and shape of your heart and how well your heart's chambers and valves are working. This procedure takes approximately one hour. There are no restrictions for this procedure.  Your physician recommends that you schedule a follow-up appointment in:

## 2013-03-25 DIAGNOSIS — Z950 Presence of cardiac pacemaker: Secondary | ICD-10-CM | POA: Insufficient documentation

## 2013-03-25 DIAGNOSIS — R600 Localized edema: Secondary | ICD-10-CM | POA: Insufficient documentation

## 2013-03-25 NOTE — Progress Notes (Signed)
Patient ID: Hailey [name], female   DOB: 04-01-1946, 67 y.o.   MRN: [id number]      Reason for office visit Edema  Ms. Washer presents today with complaints of new onset lower extremity edema and mild worsening of chronic shortness of breath. Her weight has only increased about 4 pounds from her previous appointment but she has clear evidence of swelling have to her knees bilaterally. She is cautious to avoid sodium in her diet. There have been no significant medication changes. She brought the pharmacy package insert from her Multaq , appropriately pointing out that this medication is associated with an increased risk of congestive heart failure.  She continues to have frequent episodes of paroxysmal atrial fibrillation with an overall average burden of 10%. There was a spike in atrial fibrillation in July when she had back surgery. The episodes of atrial fibrillation generalized for several hours. Ventricular rate control is uniformly adequate during the episodes of atrial fibrillation. Pacemaker function is otherwise normal. Atrial pacing occurs roughly 90% of the time, ventricular pacing occurs 20 or some of the time similar to historical averages.   Allergies  Allergen Reactions  . Codeine Nausea And Vomiting  . Demerol Nausea And Vomiting  . Ketorolac Other (See Comments)    "pt told to never take this med"    Current Outpatient Prescriptions  Medication Sig Dispense Refill  . acetaminophen (TYLENOL) 500 MG tablet Take 1,000 mg by mouth every 6 (six) hours as needed for pain.      . Calcium Carbonate-Vitamin D (CALCIUM 600+D) 600-400 MG-UNIT per tablet Take 1 tablet by mouth daily.       . Cholecalciferol (VITAMIN D3) 1000 UNITS CAPS Take 1 capsule by mouth daily.        . colesevelam (WELCHOL) 625 MG tablet Take 1,250 mg by mouth 2 (two) times daily with a meal.       . dabigatran (PRADAXA) 150 MG CAPS Take 150 mg by mouth every 12 (twelve) hours.      Marland Kitchen diltiazem (DILACOR XR) 120  MG 24 hr capsule Take 120 mg by mouth daily.      Marland Kitchen loperamide (IMODIUM A-D) 2 MG tablet Take 2 mg by mouth daily as needed for diarrhea or loose stools.      Marland Kitchen loratadine (ALLERGY RELIEF) 10 MG tablet Take 10 mg by mouth daily.      . metoprolol (LOPRESSOR) 50 MG tablet TAKE ONE TABLET BY MOUTH TWICE DAILY  60 tablet  11  . pantoprazole (PROTONIX) 40 MG tablet Take 40 mg by mouth daily.       Marland Kitchen propylthiouracil (PTU) 50 MG tablet Take 50-100 mg by mouth See admin instructions. Take 2 tabs in the AM and 1 tab in the PM      . saxagliptin HCl (ONGLYZA) 5 MG TABS tablet Take 5 mg by mouth daily.      . valsartan (DIOVAN) 80 MG tablet Take 80 mg by mouth daily.      . furosemide (LASIX) 40 MG tablet Take 1 tablet (40 mg total) by mouth daily.  30 tablet  5   No current facility-administered medications for this visit.    Past Medical History  Diagnosis Date  . Hypertension   . Paroxysmal atrial fibrillation   . Diabetes mellitus   . Allergic rhinitis   . DJD (degenerative joint disease)   . Bradycardia     s/p PPM by Dr [name]  . CAD (coronary artery disease)  70% RCA stenosis, treated medically  . Polio     as a child  . Pacemaker   . Hyperthyroidism   . Pneumonia     12  . Goiter     thyroid    Past Surgical History  Procedure Laterality Date  . Cholecystectomy    . Total abdominal hysterectomy    . Pacemaker insertion  12/03/10    SJM Accent DR RF implanted by Dr [name]  . Arm surgery d/t fx Right 96  . Tee without cardioversion N/A 07/30/2012    Procedure: TRANSESOPHAGEAL ECHOCARDIOGRAM (TEE);  Surgeon: [name], MD;  Location: [facility] ENDOSCOPY;  Service: Cardiovascular;  Laterality: N/A;  h&p in file-hope  . Atrial fibrillation ablation  07/31/2012    PVI by Dr [name]  . Lumbar laminectomy/decompression microdiscectomy Right 12/23/2012    Procedure: Right L5-S1 Laminotomy for resection of synovial cyst;  Surgeon: [name], MD;  Location: [facility] NEURO ORS;   Service: Neurosurgery;  Laterality: Right;  Right Lumbar five-sacral one Laminotomy for resection of synovial cyst    Family History  Problem Relation Age of Onset  . Heart disease Sister   . Breast cancer Sister   . Breast cancer Sister     History   Social History  . Marital Status: Married    Spouse Name: Loistine Chance    Number of Children: N/A  . Years of Education: N/A   Occupational History  . housewife    Social History Main Topics  . Smoking status: Never Smoker   . Smokeless tobacco: Never Used  . Alcohol Use: No  . Drug Use: No  . Sexual Activity: Not on file   Other Topics Concern  . Not on file   Social History Narrative   Lives in [place].  Retired.    Review of systems: The patient specifically denies any chest pain at rest or with exertion, orthopnea, paroxysmal nocturnal dyspnea, syncope, palpitations, focal neurological deficits, intermittent claudication, unexplained weight gain, cough, hemoptysis or wheezing.  The patient also denies abdominal pain, nausea, vomiting, dysphagia, diarrhea, constipation, polyuria, polydipsia, dysuria, hematuria, frequency, urgency, abnormal bleeding or bruising, fever, chills, unexpected weight changes, mood swings, change in skin or hair texture, change in voice quality, auditory or visual problems, allergic reactions or rashes, new musculoskeletal complaints other than usual "aches and pains".   PHYSICAL EXAM BP 106/60  Pulse 71  Ht 4\' 9"  (1.448 m)  Wt 163 lb 1.6 oz (73.982 kg)  BMI 35.28 kg/m2  General: Alert, oriented x3, no distress Head: no evidence of trauma, PERRL, EOMI, no exophtalmos or lid lag, no myxedema, no xanthelasma; normal ears, nose and oropharynx Neck: normal jugular venous pulsations and no hepatojugular reflux; brisk carotid pulses without delay and no carotid bruits Chest: clear to auscultation, no signs of consolidation by percussion or palpation, normal fremitus, symmetrical and full respiratory  excursions; healthy left subclavian pacemaker site Cardiovascular: normal position and quality of the apical impulse, regular rhythm, normal first and second heart sounds, no murmurs, rubs or gallops Abdomen: no tenderness or distention, no masses by palpation, no abnormal pulsatility or arterial bruits, normal bowel sounds, no hepatosplenomegaly Extremities: no clubbing, cyanosis; 2+ bilateral pitting edema almost to the knees; 2+ radial, ulnar and brachial pulses bilaterally; 2+ right femoral, posterior tibial and dorsalis pedis pulses; 2+ left femoral, posterior tibial and dorsalis pedis pulses; no subclavian or femoral bruits Neurological: grossly nonfocal  EKG: Atrial paced ventricular sensed, nonspecific diffuse T wave flattening, QTC 495 ms  No significant proteinuria on urinalysis Labs 03/11/2013 total cholesterol 139, tremors rigidity 5, HDL 76, LDL 46 Hemoglobin A5W 6.2% Normal liver function tests Creatinine 1.2, BUN 8 Albumin 3.5   ASSESSMENT AND PLAN Bilateral leg edema I suspect that this is a reflection of diastolic heart failure. She does not have significant renal or hepatic problems that could otherwise explain it. She has normal left ventricular systolic function by previous assessments most recently the pre-cardioversion TEE performed in February of this year. Multaq should be stopped. Repeat an echocardiogram to reevaluate systolic function. She is known to have a 70% asymptomatic stenosis in the right coronary artery that has been left for medical therapy. It is unlikely to explain her current symptoms. Start furosemide 40 mg daily and increase intake of potassium from food. Reevaluate after the echocardiogram is performed.  Atrial fibrillation Despite her previous ablation, although continues to have a roughly 10% prevalence of atrial fibrillation, which is symptomatic. She is unaware of palpitations but feels "exhausted" on days when she has prolonged episodes of atrial  fibrillation. If the prevalence of atrial fibrillation increases further after we discontinued the antiarrhythmic will have to find another solution. We discussed hospitalization for initiation of Pitocin therapy. The only other real alternative would be amiodarone which she would prefer to avoid because of the side effect profile. Will probably also ask Dr. Hillis Range to review options for a repeat ablation procedure. I think this was not entertained because the multipurpose providing reasonably good control of the arrhythmia.  Pacemaker [employer] accent DR RF , July 2012 She has roughly 20% ventricular pacing which is similar oral less than on proceeding device checks. It is unlikely that the ventricular pacing is therefore the cause of her new lower showed edema. It is unlikely we will be able to substantially reduce the frequency of ventricular pacing since she had second-degree atrioventricular block. Discontinuation of multiecho may help.  Orders Placed This Encounter  Procedures  . EKG 12-Lead  . 2D Echocardiogram without contrast   Meds ordered this encounter  Medications  . DISCONTD: furosemide (LASIX) 20 MG tablet    Sig: Take 2 tablets (40 mg total) on Mondays, Wednesdays, and Fridays. Take 1 tablet (20 mg total) Tuesdays, Thursdays, Saturdays, and Sundays.  Marland Kitchen loratadine (ALLERGY RELIEF) 10 MG tablet    Sig: Take 10 mg by mouth daily.  Marland Kitchen loperamide (IMODIUM A-D) 2 MG tablet    Sig: Take 2 mg by mouth daily as needed for diarrhea or loose stools.  Marland Kitchen DISCONTD: furosemide (LASIX) 20 MG tablet    Sig: Take 2 tablets (40 mg total) by mouth daily. Take 2 tablets (40 mg total) on Mondays, Wednesdays, and Fridays. Take 1 tablet (20 mg total) Tuesdays, Thursdays, Saturdays, and Sundays.    Dispense:  30 tablet    Refill:  5  . furosemide (LASIX) 40 MG tablet    Sig: Take 1 tablet (40 mg total) by mouth daily.    Dispense:  30 tablet    Refill:  5    Ikram Riebe  [name],  MD, [facility] [phone] office [phone] pager

## 2013-03-25 NOTE — Assessment & Plan Note (Signed)
I suspect that this is a reflection of diastolic heart failure. She does not have significant renal or hepatic problems that could otherwise explain it. She has normal left ventricular systolic function by previous assessments most recently the pre-cardioversion TEE performed in February of this year. Multaq should be stopped. Repeat an echocardiogram to reevaluate systolic function. She is known to have a 70% asymptomatic stenosis in the right coronary artery that has been left for medical therapy. It is unlikely to explain her current symptoms. Start furosemide 40 mg daily and increase intake of potassium from food. Reevaluate after the echocardiogram is performed.

## 2013-03-25 NOTE — Assessment & Plan Note (Signed)
Despite her previous ablation, although continues to have a roughly 10% prevalence of atrial fibrillation, which is symptomatic. She is unaware of palpitations but feels "exhausted" on days when she has prolonged episodes of atrial fibrillation. If the prevalence of atrial fibrillation increases further after we discontinued the antiarrhythmic will have to find another solution. We discussed hospitalization for initiation of Pitocin therapy. The only other real alternative would be amiodarone which she would prefer to avoid because of the side effect profile. Will probably also ask Dr. Hillis Range to review options for a repeat ablation procedure. I think this was not entertained because the multipurpose providing reasonably good control of the arrhythmia.

## 2013-03-25 NOTE — Assessment & Plan Note (Signed)
She has roughly 20% ventricular pacing which is similar oral less than on proceeding device checks. It is unlikely that the ventricular pacing is therefore the cause of her new lower showed edema. It is unlikely we will be able to substantially reduce the frequency of ventricular pacing since she had second-degree atrioventricular block. Discontinuation of multiecho may help.

## 2013-03-26 DIAGNOSIS — M7130 Other bursal cyst, unspecified site: Secondary | ICD-10-CM | POA: Diagnosis not present

## 2013-03-26 DIAGNOSIS — M48061 Spinal stenosis, lumbar region without neurogenic claudication: Secondary | ICD-10-CM | POA: Diagnosis not present

## 2013-03-27 LAB — PACEMAKER DEVICE OBSERVATION

## 2013-03-29 ENCOUNTER — Ambulatory Visit (INDEPENDENT_AMBULATORY_CARE_PROVIDER_SITE_OTHER): Payer: Medicare Other

## 2013-03-29 DIAGNOSIS — I4891 Unspecified atrial fibrillation: Secondary | ICD-10-CM

## 2013-03-29 DIAGNOSIS — I498 Other specified cardiac arrhythmias: Secondary | ICD-10-CM

## 2013-03-29 DIAGNOSIS — R001 Bradycardia, unspecified: Secondary | ICD-10-CM

## 2013-03-31 ENCOUNTER — Telehealth: Payer: Self-pay | Admitting: Cardiovascular Disease

## 2013-03-31 DIAGNOSIS — R0609 Other forms of dyspnea: Secondary | ICD-10-CM | POA: Diagnosis not present

## 2013-03-31 DIAGNOSIS — R0989 Other specified symptoms and signs involving the circulatory and respiratory systems: Secondary | ICD-10-CM | POA: Diagnosis not present

## 2013-03-31 NOTE — Telephone Encounter (Signed)
Returned call and pt verified x 2.  Stated Dr. Salena Saner told her to call back with some information.  Stated she has the answers written down from Thursday to today.  Pt informed Britta Mccreedy, CMA will be notified to contact her.  Pt verbalized understanding and agreed w/ plan.

## 2013-03-31 NOTE — Telephone Encounter (Signed)
Please call-she needs to give you an update on her condition per Dr C.Please try to call before 3:30.

## 2013-04-01 NOTE — Telephone Encounter (Signed)
No answer/no recording

## 2013-04-01 NOTE — Telephone Encounter (Signed)
Feeling much better since starting Lasix 40mg  qd.  Decreased SOB, sleeping better but still not through the night.  Weight was 163 down to 156 today. Labs done by Dr. Selena Batten Wednesday and appt Monday.  Will make sure a copy is sent here.

## 2013-04-05 DIAGNOSIS — Z23 Encounter for immunization: Secondary | ICD-10-CM | POA: Diagnosis not present

## 2013-04-05 DIAGNOSIS — I4891 Unspecified atrial fibrillation: Secondary | ICD-10-CM | POA: Diagnosis not present

## 2013-04-05 DIAGNOSIS — I10 Essential (primary) hypertension: Secondary | ICD-10-CM | POA: Diagnosis not present

## 2013-04-05 DIAGNOSIS — E0590 Thyrotoxicosis, unspecified without thyrotoxic crisis or storm: Secondary | ICD-10-CM | POA: Diagnosis not present

## 2013-04-05 DIAGNOSIS — E119 Type 2 diabetes mellitus without complications: Secondary | ICD-10-CM | POA: Diagnosis not present

## 2013-04-06 NOTE — Telephone Encounter (Signed)
Pt would like to know if she should stop taking any of her meds before her echo. She was instructed to once before but she is not sure this time. Please advise

## 2013-04-06 NOTE — Telephone Encounter (Signed)
Patient notified it is ok to take all her meds prior to the echo.  Voiced understanding.

## 2013-04-07 LAB — REMOTE PACEMAKER DEVICE
AL THRESHOLD: 0.625 V
BAMS-0001: 180 {beats}/min
BAMS-0003: 70 {beats}/min
BATTERY VOLTAGE: 2.93 V
RV LEAD AMPLITUDE: 6.6 mv
RV LEAD THRESHOLD: 0.875 V

## 2013-04-08 ENCOUNTER — Ambulatory Visit (HOSPITAL_COMMUNITY)
Admission: RE | Admit: 2013-04-08 | Discharge: 2013-04-08 | Disposition: A | Discharge status: Home / Self Care | Payer: Medicare Other | Source: Ambulatory Visit | Attending: Cardiovascular Disease | Admitting: Cardiovascular Disease

## 2013-04-08 DIAGNOSIS — R0989 Other specified symptoms and signs involving the circulatory and respiratory systems: Secondary | ICD-10-CM | POA: Diagnosis not present

## 2013-04-08 DIAGNOSIS — R0602 Shortness of breath: Secondary | ICD-10-CM

## 2013-04-08 DIAGNOSIS — R0600 Dyspnea, unspecified: Secondary | ICD-10-CM

## 2013-04-08 DIAGNOSIS — R0609 Other forms of dyspnea: Secondary | ICD-10-CM | POA: Diagnosis not present

## 2013-04-08 NOTE — Progress Notes (Signed)
2D Echo Performed 06/18/2012    Millie Forde, RCS  

## 2013-04-12 NOTE — Telephone Encounter (Signed)
Returning [place] call.

## 2013-04-12 NOTE — Progress Notes (Signed)
LMTC

## 2013-04-12 NOTE — Telephone Encounter (Signed)
Echo results given to patient.  Voiced understanding.  Will decrease fluid intake.

## 2013-04-19 ENCOUNTER — Encounter: Payer: Self-pay | Admitting: *Deleted

## 2013-04-27 DIAGNOSIS — J0100 Acute maxillary sinusitis, unspecified: Secondary | ICD-10-CM | POA: Diagnosis not present

## 2013-04-28 ENCOUNTER — Encounter: Payer: Self-pay | Admitting: Cardiovascular Disease

## 2013-04-28 ENCOUNTER — Ambulatory Visit: Payer: Medicare Other | Admitting: *Deleted

## 2013-04-28 ENCOUNTER — Ambulatory Visit (INDEPENDENT_AMBULATORY_CARE_PROVIDER_SITE_OTHER): Payer: Medicare Other | Admitting: Cardiovascular Disease

## 2013-04-28 VITALS — BP 112/62 | HR 65 | Ht 59.0 in | Wt 158.8 lb

## 2013-04-28 DIAGNOSIS — Z950 Presence of cardiac pacemaker: Secondary | ICD-10-CM

## 2013-04-28 DIAGNOSIS — I4891 Unspecified atrial fibrillation: Secondary | ICD-10-CM

## 2013-04-28 DIAGNOSIS — I5033 Acute on chronic diastolic (congestive) heart failure: Secondary | ICD-10-CM

## 2013-04-28 DIAGNOSIS — I509 Heart failure, unspecified: Secondary | ICD-10-CM

## 2013-04-28 DIAGNOSIS — I498 Other specified cardiac arrhythmias: Secondary | ICD-10-CM | POA: Diagnosis not present

## 2013-04-28 DIAGNOSIS — R001 Bradycardia, unspecified: Secondary | ICD-10-CM

## 2013-04-28 LAB — PACEMAKER DEVICE OBSERVATION

## 2013-04-28 NOTE — Patient Instructions (Signed)
Your physician recommends that you schedule a follow-up appointment in: March 2015

## 2013-04-29 ENCOUNTER — Encounter: Payer: Self-pay | Admitting: Cardiovascular Disease

## 2013-04-29 DIAGNOSIS — I5033 Acute on chronic diastolic (congestive) heart failure: Secondary | ICD-10-CM

## 2013-04-29 HISTORY — DX: Acute on chronic diastolic (congestive) heart failure: I50.33

## 2013-04-29 NOTE — Assessment & Plan Note (Signed)
Treatment with diuretics and discontinuation of multaq has been associated with a roughly 5 pound weight loss, resolution of lower extremity edema and marked reduction in dyspnea on exertion (currently NYHA class II). Multaq should not be restarted. The echo shows preserved left ventricular systolic function and confirmed substantial diastolic dysfunction with elevated left atrial pressure and moderate pulmonary arterial hypertension. Discussed sodium restriction, daily weight monitoring and treatment with diuretics. Also discussed the bidirectional relationship between heart failure and atrial fibrillation.

## 2013-04-29 NOTE — Assessment & Plan Note (Signed)
Stopping her antiarrhythmic has been associated with a doubling of the overall burden of atrial fibrillation. A few antiarrhythmic options remain. Considerations include a repeat ablation, admission for initiation of Tikosyn and long-term amiodarone, all of which have their own disadvantages. At this point in time she would like to wait and see what happens without antiarrhythmic therapy. She does have symptoms with atrial fibrillation, primarily profound fatigue. Rate control is adequate on the current medications. She is protected from card \\ioembolic  stroke with Pradaxa. We'll discuss future options with Dr. [name]: she actually has an appointment with him on December 19

## 2013-04-29 NOTE — Progress Notes (Signed)
Patient ID: Hailey [name], female   DOB: 1946-05-30, 67 y.o.   MRN: [id number]     Reason for office visit CHF, PAF, CAD  Hailey [name] returns in followup after starting diuretic therapy for what is essentially new onset diastolic heart failure. Her echocardiogram confirms that she still has normal left ventricular systolic function but also showed all the typical findings of elevated left atrial pressure. She had moderate pulmonary hypertension with an estimated systolic PA P. of 50 mm Hg. We stopped Multaq. She has subsequently had a substantial increase in the prevalence of atrial fibrillation, which is pretty much back to the 20% rate that she had before her ablation. She no longer has shortness of breath or edema. She experiences primarily fatigue when she has atrial fibrillation.   Allergies  Allergen Reactions  . Codeine Nausea And Vomiting  . Demerol Nausea And Vomiting  . Ketorolac Other (See Comments)    "pt told to never take this med"    Current Outpatient Prescriptions  Medication Sig Dispense Refill  . acetaminophen (TYLENOL) 500 MG tablet Take 1,000 mg by mouth every 6 (six) hours as needed for pain.      . benzonatate (TESSALON) 200 MG capsule Take 200 mg by mouth 3 (three) times daily as needed for cough.      . Calcium Carbonate-Vitamin D (CALCIUM 600+D) 600-400 MG-UNIT per tablet Take 1 tablet by mouth daily.       . cefUROXime (CEFTIN) 500 MG tablet Take 500 mg by mouth 2 (two) times daily.      . Cholecalciferol (VITAMIN D3) 1000 UNITS CAPS Take 1 capsule by mouth daily.        . colesevelam (WELCHOL) 625 MG tablet Take 1,250 mg by mouth 2 (two) times daily with a meal.       . dabigatran (PRADAXA) 150 MG CAPS Take 150 mg by mouth every 12 (twelve) hours.      Marland Kitchen diltiazem (DILACOR XR) 120 MG 24 hr capsule Take 120 mg by mouth daily.      . furosemide (LASIX) 40 MG tablet Take 1 tablet (40 mg total) by mouth daily.  30 tablet  5  . guaiFENesin (MUCINEX) 600 MG 12 hr  tablet Take by mouth 2 (two) times daily.      Marland Kitchen loperamide (IMODIUM A-D) 2 MG tablet Take 2 mg by mouth daily as needed for diarrhea or loose stools.      Marland Kitchen loratadine (ALLERGY RELIEF) 10 MG tablet Take 10 mg by mouth daily.      . metoprolol (LOPRESSOR) 50 MG tablet TAKE ONE TABLET BY MOUTH TWICE DAILY  60 tablet  11  . pantoprazole (PROTONIX) 40 MG tablet Take 40 mg by mouth daily.       Marland Kitchen propylthiouracil (PTU) 50 MG tablet Take 50-100 mg by mouth See admin instructions. Take 2 tabs in the AM and 1 tab in the PM      . saxagliptin HCl (ONGLYZA) 5 MG TABS tablet Take 5 mg by mouth daily.      . valsartan (DIOVAN) 80 MG tablet Take 80 mg by mouth daily.       No current facility-administered medications for this visit.    Past Medical History  Diagnosis Date  . Hypertension   . Paroxysmal atrial fibrillation   . Diabetes mellitus   . Allergic rhinitis   . DJD (degenerative joint disease)   . Bradycardia     s/p PPM by Dr [name]  .  CAD (coronary artery disease)     70% RCA stenosis, treated medically  . Polio     as a child  . Pacemaker   . Hyperthyroidism   . Pneumonia     12  . Goiter     thyroid  . Acute on chronic diastolic CHF (congestive heart failure), NYHA class 1 04/29/2013    Past Surgical History  Procedure Laterality Date  . Cholecystectomy    . Total abdominal hysterectomy    . Pacemaker insertion  12/03/10    SJM Accent DR RF implanted by Dr [name]  . Arm surgery d/t fx Right 96  . Tee without cardioversion N/A 07/30/2012    Procedure: TRANSESOPHAGEAL ECHOCARDIOGRAM (TEE);  Surgeon: [name], MD;  Location: [facility] ENDOSCOPY;  Service: Cardiovascular;  Laterality: N/A;  h&p in file-hope  . Atrial fibrillation ablation  07/31/2012    PVI by Dr [name]  . Lumbar laminectomy/decompression microdiscectomy Right 12/23/2012    Procedure: Right L5-S1 Laminotomy for resection of synovial cyst;  Surgeon: [name], MD;  Location: [facility] NEURO ORS;  Service:  Neurosurgery;  Laterality: Right;  Right Lumbar five-sacral one Laminotomy for resection of synovial cyst    Family History  Problem Relation Age of Onset  . Heart disease Sister   . Breast cancer Sister   . Breast cancer Sister     History   Social History  . Marital Status: Married    Spouse Name: Loistine Chance    Number of Children: N/A  . Years of Education: N/A   Occupational History  . housewife    Social History Main Topics  . Smoking status: Never Smoker   . Smokeless tobacco: Never Used  . Alcohol Use: No  . Drug Use: No  . Sexual Activity: Not on file   Other Topics Concern  . Not on file   Social History Narrative   Lives in [place].  Retired.    Review of systems: Dyspnea with moderate exertion. Fatigue. The patient specifically denies any chest pain at rest or with exertion, dyspnea at rest, orthopnea, paroxysmal nocturnal dyspnea, syncope, palpitations, focal neurological deficits, intermittent claudication, lower extremity edema, unexplained weight gain, cough, hemoptysis or wheezing.  The patient also denies abdominal pain, nausea, vomiting, dysphagia, diarrhea, constipation, polyuria, polydipsia, dysuria, hematuria, frequency, urgency, abnormal bleeding or bruising, fever, chills, unexpected weight changes, mood swings, change in skin or hair texture, change in voice quality, auditory or visual problems, allergic reactions or rashes, new musculoskeletal complaints other than usual "aches and pains".   PHYSICAL EXAM BP 112/62  Pulse 65  Ht 4\' 11"  (1.499 m)  Wt 158 lb 12.8 oz (72.031 kg)  BMI 32.06 kg/m2 General: Alert, oriented x3, no distress  Head: no evidence of trauma, PERRL, EOMI, no exophtalmos or lid lag, no myxedema, no xanthelasma; normal ears, nose and oropharynx  Neck: normal jugular venous pulsations and no hepatojugular reflux; brisk carotid pulses without delay and no carotid bruits  Chest: clear to auscultation, no signs of consolidation  by percussion or palpation, normal fremitus, symmetrical and full respiratory excursions; healthy left subclavian pacemaker site  Cardiovascular: normal position and quality of the apical impulse, regular rhythm, normal first and second heart sounds, no murmurs, rubs or gallops  Abdomen: no tenderness or distention, no masses by palpation, no abnormal pulsatility or arterial bruits, normal bowel sounds, no hepatosplenomegaly  Extremities: no clubbing, cyanosis or edema 2+ radial, ulnar and brachial pulses bilaterally; 2+ right femoral, posterior tibial and dorsalis pedis  pulses; 2+ left femoral, posterior tibial and dorsalis pedis pulses; no subclavian or femoral bruits  Neurological: grossly nonfocal   Lipid Panel     Component Value Date/Time   CHOL 94 11/24/2010 0837   TRIG 51 11/24/2010 0837   HDL 53 11/24/2010 0837   CHOLHDL 1.8 11/24/2010 0837   VLDL 10 11/24/2010 0837   LDLCALC 31 11/24/2010 0837    BMET    Component Value Date/Time   NA 138 12/18/2012 1352   K 3.8 12/18/2012 1352   CL 101 12/18/2012 1352   CO2 26 12/18/2012 1352   GLUCOSE 105* 12/18/2012 1352   BUN 13 12/18/2012 1352   CREATININE 0.78 12/18/2012 1352   CALCIUM 9.5 12/18/2012 1352   GFRNONAA 85* 12/18/2012 1352   GFRAA >90 12/18/2012 1352     ASSESSMENT AND PLAN Acute on chronic diastolic CHF (congestive heart failure), NYHA class 1 Treatment with diuretics and discontinuation of multaq has been associated with a roughly 5 pound weight loss, resolution of lower extremity edema and marked reduction in dyspnea on exertion (currently NYHA class II). Multaq should not be restarted. The echo shows preserved left ventricular systolic function and confirmed substantial diastolic dysfunction with elevated left atrial pressure and moderate pulmonary arterial hypertension. Discussed sodium restriction, daily weight monitoring and treatment with diuretics. Also discussed the bidirectional relationship between heart failure and atrial  fibrillation.  Atrial fibrillation Stopping her antiarrhythmic has been associated with a doubling of the overall burden of atrial fibrillation. A few antiarrhythmic options remain. Considerations include a repeat ablation, admission for initiation of Tikosyn and long-term amiodarone, all of which have their own disadvantages. At this point in time she would like to wait and see what happens without antiarrhythmic therapy. She does have symptoms with atrial fibrillation, primarily profound fatigue. Rate control is adequate on the current medications. She is protected from card \\ioembolic  stroke with Pradaxa. We'll discuss future options with Dr. [name]: she actually has an appointment with him on December 19  Patient Instructions  Your physician recommends that you schedule a follow-up appointment in: March 2015    Meds ordered this encounter  Medications  . cefUROXime (CEFTIN) 500 MG tablet    Sig: Take 500 mg by mouth 2 (two) times daily.  . benzonatate (TESSALON) 200 MG capsule    Sig: Take 200 mg by mouth 3 (three) times daily as needed for cough.  Marland Kitchen guaiFENesin (MUCINEX) 600 MG 12 hr tablet    Sig: Take by mouth 2 (two) times daily.    Junious Silk, MD, [facility] [phone] office [phone] pager

## 2013-04-29 NOTE — Assessment & Plan Note (Signed)
Roughly 20% burden of atrial fibrillation, roughly 80% atrial pacing and 5% ventricular pacing. The substantial reduction in ventricular pacing is probably secondary to stopping the Multaq, with higher rates during atrial fibrillation, but also improved AV conduction. Her pacemaker was implanted for second-degree AV block and ventricular pacing will not be completely abolished.

## 2013-05-05 LAB — MDC_IDC_ENUM_SESS_TYPE_INCLINIC
Brady Statistic RV Percent Paced: 21 %
Implantable Pulse Generator Serial Number: 782791
Lead Channel Impedance Value: 390 Ohm
Lead Channel Impedance Value: 400 Ohm
Lead Channel Pacing Threshold Amplitude: 0.75 V
Lead Channel Pacing Threshold Pulse Width: 0.4 ms
Lead Channel Sensing Intrinsic Amplitude: 3.6 mV

## 2013-05-05 NOTE — Progress Notes (Signed)
Pacemaker check in clinic. Normal device function. Thresholds, sensing, impedances consistent with previous measurements. Device programmed to maximize longevity. 48 mode switches (20%) while off Multaq---max dur. >1 day on (11-12) + Pradaxa. No high ventricular rates noted. Device programmed at appropriate safety margins. Histogram distribution appropriate for patient activity level. Device programmed to optimize intrinsic conduction. Estimated longevity >7 years. Patient will follow up with JA on 12-19.

## 2013-05-21 ENCOUNTER — Encounter: Payer: Self-pay | Admitting: Internal Medicine

## 2013-05-21 ENCOUNTER — Ambulatory Visit (INDEPENDENT_AMBULATORY_CARE_PROVIDER_SITE_OTHER): Payer: Medicare Other | Admitting: Internal Medicine

## 2013-05-21 VITALS — BP 142/71 | HR 69 | Wt 158.0 lb

## 2013-05-21 DIAGNOSIS — I4891 Unspecified atrial fibrillation: Secondary | ICD-10-CM | POA: Diagnosis not present

## 2013-05-21 DIAGNOSIS — Z950 Presence of cardiac pacemaker: Secondary | ICD-10-CM | POA: Diagnosis not present

## 2013-05-21 LAB — MDC_IDC_ENUM_SESS_TYPE_INCLINIC
Battery Remaining Longevity: 80.4 mo
Battery Voltage: 2.93 V
Implantable Pulse Generator Serial Number: 7255453
Lead Channel Impedance Value: 400 Ohm
Lead Channel Pacing Threshold Amplitude: 0.625 V
Lead Channel Pacing Threshold Amplitude: 0.875 V
Lead Channel Sensing Intrinsic Amplitude: 3 mV
Lead Channel Setting Pacing Amplitude: 1.125
Lead Channel Setting Pacing Pulse Width: 0.4 ms

## 2013-05-21 NOTE — Patient Instructions (Addendum)
Your physician recommends that you schedule a follow-up appointment in: 2 months with Dr [name]  Call Anselm Pancoast [phone] if you decide to do Tikosyn or proceed with repeat ablation  If you do repeat ablation will need a cardiac CT prior to procedure

## 2013-05-26 ENCOUNTER — Emergency Department (HOSPITAL_COMMUNITY): Payer: Medicare Other

## 2013-05-26 ENCOUNTER — Inpatient Hospital Stay (HOSPITAL_COMMUNITY)
Admission: EM | Admit: 2013-05-26 | Discharge: 2013-05-29 | DRG: 193 | Disposition: A | Discharge status: Home / Self Care | Payer: Medicare Other | Attending: Internal Medicine | Admitting: Internal Medicine

## 2013-05-26 ENCOUNTER — Encounter (HOSPITAL_COMMUNITY): Payer: Self-pay | Admitting: Emergency Medicine

## 2013-05-26 DIAGNOSIS — Z79899 Other long term (current) drug therapy: Secondary | ICD-10-CM | POA: Diagnosis not present

## 2013-05-26 DIAGNOSIS — J1100 Influenza due to unidentified influenza virus with unspecified type of pneumonia: Principal | ICD-10-CM | POA: Diagnosis present

## 2013-05-26 DIAGNOSIS — Z950 Presence of cardiac pacemaker: Secondary | ICD-10-CM

## 2013-05-26 DIAGNOSIS — E119 Type 2 diabetes mellitus without complications: Secondary | ICD-10-CM | POA: Diagnosis present

## 2013-05-26 DIAGNOSIS — I4891 Unspecified atrial fibrillation: Secondary | ICD-10-CM | POA: Diagnosis not present

## 2013-05-26 DIAGNOSIS — J189 Pneumonia, unspecified organism: Secondary | ICD-10-CM | POA: Diagnosis not present

## 2013-05-26 DIAGNOSIS — E876 Hypokalemia: Secondary | ICD-10-CM | POA: Diagnosis not present

## 2013-05-26 DIAGNOSIS — I10 Essential (primary) hypertension: Secondary | ICD-10-CM | POA: Diagnosis present

## 2013-05-26 DIAGNOSIS — R600 Localized edema: Secondary | ICD-10-CM | POA: Diagnosis present

## 2013-05-26 DIAGNOSIS — M545 Low back pain, unspecified: Secondary | ICD-10-CM | POA: Diagnosis not present

## 2013-05-26 DIAGNOSIS — Z8612 Personal history of poliomyelitis: Secondary | ICD-10-CM | POA: Diagnosis not present

## 2013-05-26 DIAGNOSIS — R590 Localized enlarged lymph nodes: Secondary | ICD-10-CM

## 2013-05-26 DIAGNOSIS — I509 Heart failure, unspecified: Secondary | ICD-10-CM | POA: Diagnosis not present

## 2013-05-26 DIAGNOSIS — R112 Nausea with vomiting, unspecified: Secondary | ICD-10-CM | POA: Diagnosis not present

## 2013-05-26 DIAGNOSIS — R6889 Other general symptoms and signs: Secondary | ICD-10-CM | POA: Diagnosis not present

## 2013-05-26 DIAGNOSIS — M1990 Unspecified osteoarthritis, unspecified site: Secondary | ICD-10-CM | POA: Diagnosis present

## 2013-05-26 DIAGNOSIS — J159 Unspecified bacterial pneumonia: Secondary | ICD-10-CM | POA: Diagnosis present

## 2013-05-26 DIAGNOSIS — I2510 Atherosclerotic heart disease of native coronary artery without angina pectoris: Secondary | ICD-10-CM | POA: Diagnosis present

## 2013-05-26 DIAGNOSIS — I480 Paroxysmal atrial fibrillation: Secondary | ICD-10-CM | POA: Diagnosis present

## 2013-05-26 DIAGNOSIS — Z886 Allergy status to analgesic agent status: Secondary | ICD-10-CM | POA: Diagnosis not present

## 2013-05-26 DIAGNOSIS — I4892 Unspecified atrial flutter: Secondary | ICD-10-CM | POA: Diagnosis present

## 2013-05-26 DIAGNOSIS — R197 Diarrhea, unspecified: Secondary | ICD-10-CM | POA: Diagnosis not present

## 2013-05-26 DIAGNOSIS — Z7901 Long term (current) use of anticoagulants: Secondary | ICD-10-CM

## 2013-05-26 DIAGNOSIS — K219 Gastro-esophageal reflux disease without esophagitis: Secondary | ICD-10-CM | POA: Diagnosis present

## 2013-05-26 DIAGNOSIS — I5030 Unspecified diastolic (congestive) heart failure: Secondary | ICD-10-CM | POA: Diagnosis not present

## 2013-05-26 DIAGNOSIS — D649 Anemia, unspecified: Secondary | ICD-10-CM | POA: Diagnosis present

## 2013-05-26 DIAGNOSIS — Z803 Family history of malignant neoplasm of breast: Secondary | ICD-10-CM | POA: Diagnosis not present

## 2013-05-26 DIAGNOSIS — R0902 Hypoxemia: Secondary | ICD-10-CM | POA: Diagnosis not present

## 2013-05-26 DIAGNOSIS — R001 Bradycardia, unspecified: Secondary | ICD-10-CM

## 2013-05-26 DIAGNOSIS — R911 Solitary pulmonary nodule: Secondary | ICD-10-CM

## 2013-05-26 DIAGNOSIS — I5033 Acute on chronic diastolic (congestive) heart failure: Secondary | ICD-10-CM | POA: Diagnosis not present

## 2013-05-26 DIAGNOSIS — J111 Influenza due to unidentified influenza virus with other respiratory manifestations: Secondary | ICD-10-CM | POA: Diagnosis not present

## 2013-05-26 DIAGNOSIS — I2789 Other specified pulmonary heart diseases: Secondary | ICD-10-CM | POA: Diagnosis present

## 2013-05-26 DIAGNOSIS — E0590 Thyrotoxicosis, unspecified without thyrotoxic crisis or storm: Secondary | ICD-10-CM | POA: Diagnosis present

## 2013-05-26 DIAGNOSIS — J101 Influenza due to other identified influenza virus with other respiratory manifestations: Secondary | ICD-10-CM

## 2013-05-26 DIAGNOSIS — J9600 Acute respiratory failure, unspecified whether with hypoxia or hypercapnia: Secondary | ICD-10-CM | POA: Diagnosis present

## 2013-05-26 DIAGNOSIS — I5032 Chronic diastolic (congestive) heart failure: Secondary | ICD-10-CM | POA: Diagnosis present

## 2013-05-26 LAB — CBC
HCT: 34 % — ABNORMAL LOW (ref 36.0–46.0)
Hemoglobin: 10.9 g/dL — ABNORMAL LOW (ref 12.0–15.0)
MCHC: 32.1 g/dL (ref 30.0–36.0)
MCV: 83.3 fL (ref 78.0–100.0)
RDW: 14.4 % (ref 11.5–15.5)
WBC: 10.2 10*3/uL (ref 4.0–10.5)

## 2013-05-26 LAB — BASIC METABOLIC PANEL
BUN: 7 mg/dL (ref 6–23)
CO2: 26 mEq/L (ref 19–32)
Chloride: 101 mEq/L (ref 96–112)
Creatinine, Ser: 0.62 mg/dL (ref 0.50–1.10)
GFR calc Af Amer: >90 mL/min (ref >90)
GFR calc non Af Amer: >90 mL/min (ref >90)
Glucose, Bld: 157 mg/dL — ABNORMAL HIGH (ref 70–99)
Potassium: 3.7 mEq/L (ref 3.5–5.1)
Sodium: 138 mEq/L (ref 135–145)

## 2013-05-26 LAB — STREP PNEUMONIAE URINARY ANTIGEN: Strep Pneumo Urinary Antigen: NEGATIVE

## 2013-05-26 LAB — POCT I-STAT TROPONIN I

## 2013-05-26 LAB — GLUCOSE, CAPILLARY

## 2013-05-26 MED ORDER — ONDANSETRON HCL 4 MG/2ML IJ SOLN
INTRAMUSCULAR | Status: AC
  Filled 2013-05-26: qty 2

## 2013-05-26 MED ORDER — LINAGLIPTIN 5 MG PO TABS
5.0000 mg | ORAL_TABLET | Freq: Every day | ORAL | Status: DC
  Administered 2013-05-26 – 2013-05-28 (×3): 5 mg via ORAL
  Filled 2013-05-26 (×5): qty 1

## 2013-05-26 MED ORDER — IPRATROPIUM BROMIDE 0.02 % IN SOLN
0.5000 mg | RESPIRATORY_TRACT | Status: AC
  Administered 2013-05-26: 0.5 mg via RESPIRATORY_TRACT
  Filled 2013-05-26: qty 2.5

## 2013-05-26 MED ORDER — ONDANSETRON HCL 4 MG/2ML IJ SOLN
4.0000 mg | Freq: Four times a day (QID) | INTRAMUSCULAR | Status: DC | PRN
  Administered 2013-05-26 – 2013-05-27 (×2): 4 mg via INTRAVENOUS
  Filled 2013-05-26: qty 2

## 2013-05-26 MED ORDER — ALBUTEROL SULFATE (5 MG/ML) 0.5% IN NEBU: 2.5000 mg | INHALATION_SOLUTION | Freq: Three times a day (TID) | RESPIRATORY_TRACT | Status: DC

## 2013-05-26 MED ORDER — PROMETHAZINE HCL 25 MG/ML IJ SOLN
12.5000 mg | Freq: Four times a day (QID) | INTRAMUSCULAR | Status: DC | PRN
  Administered 2013-05-26 – 2013-05-27 (×2): 12.5 mg via INTRAVENOUS
  Filled 2013-05-26 (×2): qty 1

## 2013-05-26 MED ORDER — DEXTROSE 5 % IV SOLN
1.0000 g | Freq: Once | INTRAVENOUS | Status: AC
  Administered 2013-05-26: 1 g via INTRAVENOUS
  Filled 2013-05-26: qty 10

## 2013-05-26 MED ORDER — OSELTAMIVIR PHOSPHATE 30 MG PO CAPS
30.0000 mg | ORAL_CAPSULE | Freq: Two times a day (BID) | ORAL | Status: AC
  Administered 2013-05-26 – 2013-05-28 (×5): 30 mg via ORAL
  Filled 2013-05-26 (×6): qty 1

## 2013-05-26 MED ORDER — DM-GUAIFENESIN ER 30-600 MG PO TB12
1.0000 | ORAL_TABLET | Freq: Two times a day (BID) | ORAL | Status: DC
  Administered 2013-05-26 – 2013-05-29 (×6): 1 via ORAL
  Filled 2013-05-26 (×7): qty 1

## 2013-05-26 MED ORDER — ALBUTEROL SULFATE (5 MG/ML) 0.5% IN NEBU
2.5000 mg | INHALATION_SOLUTION | Freq: Four times a day (QID) | RESPIRATORY_TRACT | Status: DC
  Administered 2013-05-26: 2.5 mg via RESPIRATORY_TRACT
  Filled 2013-05-26 (×2): qty 0.5

## 2013-05-26 MED ORDER — METOPROLOL TARTRATE 50 MG PO TABS
50.0000 mg | ORAL_TABLET | Freq: Two times a day (BID) | ORAL | Status: DC
  Administered 2013-05-26 – 2013-05-29 (×6): 50 mg via ORAL
  Filled 2013-05-26 (×7): qty 1

## 2013-05-26 MED ORDER — VITAMIN D3 25 MCG (1000 UNIT) PO TABS
1000.0000 [IU] | ORAL_TABLET | Freq: Every day | ORAL | Status: DC
  Administered 2013-05-26 – 2013-05-29 (×4): 1000 [IU] via ORAL
  Filled 2013-05-26 (×4): qty 1

## 2013-05-26 MED ORDER — DILTIAZEM HCL ER 120 MG PO CP24
120.0000 mg | ORAL_CAPSULE | Freq: Every day | ORAL | Status: DC
  Administered 2013-05-27 – 2013-05-28 (×2): 120 mg via ORAL
  Filled 2013-05-26 (×2): qty 1

## 2013-05-26 MED ORDER — ACETAMINOPHEN 500 MG PO TABS: 1000.0000 mg | ORAL_TABLET | Freq: Three times a day (TID) | ORAL | Status: DC | PRN

## 2013-05-26 MED ORDER — PROPYLTHIOURACIL 50 MG PO TABS: 50.0000 mg | ORAL_TABLET | Freq: Two times a day (BID) | ORAL | Status: DC

## 2013-05-26 MED ORDER — LORATADINE 10 MG PO TABS
10.0000 mg | ORAL_TABLET | Freq: Every day | ORAL | Status: DC
Start: 2013-05-26 — End: 2013-05-29
  Administered 2013-05-26 – 2013-05-29 (×4): 10 mg via ORAL
  Filled 2013-05-26 (×4): qty 1

## 2013-05-26 MED ORDER — AZITHROMYCIN 500 MG PO TABS
500.0000 mg | ORAL_TABLET | ORAL | Status: DC
  Administered 2013-05-27 – 2013-05-29 (×3): 500 mg via ORAL
  Filled 2013-05-26 (×3): qty 1

## 2013-05-26 MED ORDER — IRBESARTAN 75 MG PO TABS
75.0000 mg | ORAL_TABLET | Freq: Every day | ORAL | Status: DC
  Administered 2013-05-26 – 2013-05-29 (×4): 75 mg via ORAL
  Filled 2013-05-26 (×4): qty 1

## 2013-05-26 MED ORDER — BENZONATATE 100 MG PO CAPS
100.0000 mg | ORAL_CAPSULE | Freq: Once | ORAL | Status: AC
  Administered 2013-05-26: 100 mg via ORAL
  Filled 2013-05-26: qty 1

## 2013-05-26 MED ORDER — BENZONATATE 100 MG PO CAPS
200.0000 mg | ORAL_CAPSULE | Freq: Three times a day (TID) | ORAL | Status: DC | PRN
  Administered 2013-05-26 – 2013-05-28 (×3): 200 mg via ORAL
  Filled 2013-05-26 (×3): qty 2

## 2013-05-26 MED ORDER — DEXTROSE 5 % IV SOLN
1.0000 g | INTRAVENOUS | Status: DC
  Administered 2013-05-27 – 2013-05-28 (×2): 1 g via INTRAVENOUS
  Filled 2013-05-26 (×3): qty 10

## 2013-05-26 MED ORDER — DABIGATRAN ETEXILATE MESYLATE 150 MG PO CAPS
150.0000 mg | ORAL_CAPSULE | Freq: Two times a day (BID) | ORAL | Status: DC
Start: 2013-05-26 — End: 2013-05-29
  Administered 2013-05-26 – 2013-05-29 (×6): 150 mg via ORAL
  Filled 2013-05-26 (×7): qty 1

## 2013-05-26 MED ORDER — LEVALBUTEROL HCL 0.63 MG/3ML IN NEBU
0.6300 mg | INHALATION_SOLUTION | Freq: Three times a day (TID) | RESPIRATORY_TRACT | Status: DC
  Administered 2013-05-27 – 2013-05-29 (×8): 0.63 mg via RESPIRATORY_TRACT
  Filled 2013-05-26 (×16): qty 3

## 2013-05-26 MED ORDER — DEXTROSE 5 % IV SOLN
500.0000 mg | Freq: Once | INTRAVENOUS | Status: AC
  Administered 2013-05-26: 500 mg via INTRAVENOUS

## 2013-05-26 MED ORDER — INSULIN ASPART 100 UNIT/ML ~~LOC~~ SOLN
0.0000 [IU] | Freq: Three times a day (TID) | SUBCUTANEOUS | Status: DC
  Administered 2013-05-26: 3 [IU] via SUBCUTANEOUS
  Administered 2013-05-27 – 2013-05-28 (×3): 2 [IU] via SUBCUTANEOUS

## 2013-05-26 MED ORDER — PROPYLTHIOURACIL 50 MG PO TABS
50.0000 mg | ORAL_TABLET | Freq: Every evening | ORAL | Status: DC
  Administered 2013-05-26 – 2013-05-28 (×3): 50 mg via ORAL
  Filled 2013-05-26 (×4): qty 1

## 2013-05-26 MED ORDER — PROPYLTHIOURACIL 50 MG PO TABS
100.0000 mg | ORAL_TABLET | Freq: Every morning | ORAL | Status: DC
  Administered 2013-05-27 – 2013-05-29 (×3): 100 mg via ORAL
  Filled 2013-05-26 (×3): qty 2

## 2013-05-26 MED ORDER — ALBUTEROL SULFATE (5 MG/ML) 0.5% IN NEBU
2.5000 mg | INHALATION_SOLUTION | RESPIRATORY_TRACT | Status: AC
  Administered 2013-05-26: 2.5 mg via RESPIRATORY_TRACT
  Filled 2013-05-26: qty 0.5

## 2013-05-26 MED ORDER — COLESEVELAM HCL 625 MG PO TABS
1250.0000 mg | ORAL_TABLET | Freq: Two times a day (BID) | ORAL | Status: DC
  Administered 2013-05-27 – 2013-05-29 (×3): 1250 mg via ORAL
  Filled 2013-05-26 (×8): qty 2

## 2013-05-26 MED ORDER — ONDANSETRON HCL 4 MG/2ML IJ SOLN
4.0000 mg | Freq: Once | INTRAMUSCULAR | Status: AC
  Administered 2013-05-26: 4 mg via INTRAVENOUS
  Filled 2013-05-26: qty 2

## 2013-05-26 MED ORDER — LEVALBUTEROL HCL 0.63 MG/3ML IN NEBU: 0.6300 mg | INHALATION_SOLUTION | Freq: Four times a day (QID) | RESPIRATORY_TRACT | Status: DC | PRN

## 2013-05-26 MED ORDER — PANTOPRAZOLE SODIUM 40 MG PO TBEC
40.0000 mg | DELAYED_RELEASE_TABLET | Freq: Every day | ORAL | Status: DC
  Administered 2013-05-26 – 2013-05-29 (×4): 40 mg via ORAL
  Filled 2013-05-26 (×4): qty 1

## 2013-05-26 MED ORDER — CALCIUM CARBONATE-VITAMIN D 500-200 MG-UNIT PO TABS
1.0000 | ORAL_TABLET | Freq: Every day | ORAL | Status: DC
  Administered 2013-05-26 – 2013-05-29 (×4): 1 via ORAL
  Filled 2013-05-26 (×4): qty 1

## 2013-05-26 MED ORDER — HYDROCODONE-ACETAMINOPHEN 5-325 MG PO TABS
2.0000 | ORAL_TABLET | Freq: Once | ORAL | Status: AC
  Administered 2013-05-26: 2 via ORAL
  Filled 2013-05-26: qty 2

## 2013-05-26 MED ORDER — FUROSEMIDE 40 MG PO TABS
40.0000 mg | ORAL_TABLET | Freq: Every day | ORAL | Status: DC
  Administered 2013-05-26 – 2013-05-29 (×4): 40 mg via ORAL
  Filled 2013-05-26 (×4): qty 1

## 2013-05-26 NOTE — ED Notes (Signed)
Pt c/o sob, sts its been hard for her to catch her breath since yesterday and then progressively got worse last night and this morning. Reports she has had a cough for a few days but last night she couldn't even sleep because the cough was so bad, especially when she tried to lay down. sts n/v started last night. Denies fevers at home but sts she was having some chills. Nad, skin warm and dry, resp e/u.

## 2013-05-26 NOTE — Progress Notes (Signed)
Pt arrived from ED, pt a/o, pt c/o nausea, PRN Zofran given as ordered, oob to bathroom, pt on 2L for SOB, O2 95%, vss, flu swab and urine sent to lab, pt stable, will continue to monitor

## 2013-05-26 NOTE — ED Notes (Signed)
Pt undressed, in gown, on monitor, continuous pulse oximetry and blood pressure cuff; pt placed on oxygen  (2L) stating at 99%

## 2013-05-26 NOTE — Progress Notes (Signed)
67 yo F presenting on 12/24 with CAP and flu-like symptoms to start ceftriaxone, azithromycin and Tamiflu.   Pharmacy consulted to renally dose adjust antibiotics (CrCl ~58 ml/min). Ceftriaxone and azithromycin appropriate, Tamiflu will be adjusted to 30 mg PO BID x 5 days. Will continue to monitor renal function and adjust as needed.   Margie Billet, PharmD Clinical Pharmacist - Resident Pager: [phone] Pharmacy: [phone] 05/26/2013 6:08 PM

## 2013-05-26 NOTE — ED Notes (Signed)
Per EMS - last night pt started to have nausea and cough that created lower back pain. Nonproductive cough. Feels like she needs to cough something up. Has pacemaker, [employer] . Hx of DM CBG 149. Hasn't taken all of meds this morning. Started 20 G in left hand. Administered 4 mg of zofran, no vomiting with EMS. Pt vomiting last night. O2 at 4 liters, doesn't wear oxygen at home. Dr. Selena Batten is her PCP.

## 2013-05-26 NOTE — ED Notes (Signed)
Phlebotomy at bedside.

## 2013-05-26 NOTE — ED Provider Notes (Signed)
CSN: [id number]     Arrival date & time 05/26/13  1107 History   First MD Initiated Contact with Patient 05/26/13 1107     Chief Complaint  Patient presents with  . Nausea  . Emesis   (Consider location/radiation/quality/duration/timing/severity/associated sxs/prior Treatment) Patient is a 67 y.o. female presenting with vomiting and cough. The history is provided by the patient.  Emesis Severity:  Mild Associated symptoms: no abdominal pain and no chills   Cough Cough characteristics:  Productive Sputum characteristics:  Nondescript Severity:  Moderate Onset quality:  Gradual Duration:  1 hour Timing:  Constant Progression:  Worsening Chronicity:  New Smoker: no   Context: not weather changes and not with activity   Relieved by:  Nothing Worsened by:  Nothing tried Associated symptoms: no chills, no fever and no shortness of breath     Past Medical History  Diagnosis Date  . Hypertension   . Paroxysmal atrial fibrillation   . Diabetes mellitus   . Allergic rhinitis   . DJD (degenerative joint disease)   . Bradycardia     s/p PPM by Dr [name]  . CAD (coronary artery disease)     70% RCA stenosis, treated medically  . Polio     as a child  . Pacemaker   . Hyperthyroidism   . Pneumonia     12  . Goiter     thyroid  . Acute on chronic diastolic CHF (congestive heart failure), NYHA class 1 04/29/2013   Past Surgical History  Procedure Laterality Date  . Cholecystectomy    . Total abdominal hysterectomy    . Pacemaker insertion  12/03/10    SJM Accent DR RF implanted by Dr [name]  . Arm surgery d/t fx Right 96  . Tee without cardioversion N/A 07/30/2012    Procedure: TRANSESOPHAGEAL ECHOCARDIOGRAM (TEE);  Surgeon: [name], MD;  Location: [facility] ENDOSCOPY;  Service: Cardiovascular;  Laterality: N/A;  h&p in file-hope  . Atrial fibrillation ablation  07/31/2012    PVI by Dr [name]  . Lumbar laminectomy/decompression microdiscectomy Right 12/23/2012   Procedure: Right L5-S1 Laminotomy for resection of synovial cyst;  Surgeon: [name], MD;  Location: [facility] NEURO ORS;  Service: Neurosurgery;  Laterality: Right;  Right Lumbar five-sacral one Laminotomy for resection of synovial cyst   Family History  Problem Relation Age of Onset  . Heart disease Sister   . Breast cancer Sister   . Breast cancer Sister    History  Substance Use Topics  . Smoking status: Never Smoker   . Smokeless tobacco: Never Used  . Alcohol Use: No   OB History   Grav Para Term Preterm Abortions TAB SAB Ect Mult Living                 Review of Systems  Constitutional: Negative for fever and chills.  Respiratory: Positive for cough. Negative for shortness of breath.   Gastrointestinal: Positive for nausea. Negative for vomiting and abdominal pain.  All other systems reviewed and are negative.    Allergies  Codeine; Demerol; and Ketorolac  Home Medications   Current Outpatient Rx  Name  Route  Sig  Dispense  Refill  . acetaminophen (TYLENOL) 500 MG tablet   Oral   Take 1,000 mg by mouth every 6 (six) hours as needed for pain.         . Calcium Carbonate-Vitamin D (CALCIUM 600+D) 600-400 MG-UNIT per tablet   Oral   Take 1 tablet by mouth daily.          Marland Kitchen  Cholecalciferol (VITAMIN D3) 1000 UNITS CAPS   Oral   Take 1 capsule by mouth daily.           . colesevelam (WELCHOL) 625 MG tablet   Oral   Take 1,250 mg by mouth 2 (two) times daily with a meal.          . dabigatran (PRADAXA) 150 MG CAPS capsule   Oral   Take 150 mg by mouth 2 (two) times daily.         Marland Kitchen diltiazem (DILACOR XR) 120 MG 24 hr capsule   Oral   Take 120 mg by mouth daily.         . furosemide (LASIX) 40 MG tablet   Oral   Take 1 tablet (40 mg total) by mouth daily.   30 tablet   5   . loratadine (ALLERGY RELIEF) 10 MG tablet   Oral   Take 10 mg by mouth daily.         . metoprolol (LOPRESSOR) 50 MG tablet   Oral   Take 50 mg by mouth 2 (two)  times daily.         . pantoprazole (PROTONIX) 40 MG tablet   Oral   Take 40 mg by mouth daily.          Marland Kitchen propylthiouracil (PTU) 50 MG tablet   Oral   Take 50-100 mg by mouth 2 (two) times daily. Take 2 tabs in the AM and 1 tab in the PM         . saxagliptin HCl (ONGLYZA) 5 MG TABS tablet   Oral   Take 5 mg by mouth daily.         . valsartan (DIOVAN) 80 MG tablet   Oral   Take 80 mg by mouth daily.         . benzonatate (TESSALON) 200 MG capsule   Oral   Take 200 mg by mouth 3 (three) times daily as needed for cough.         . loperamide (IMODIUM A-D) 2 MG tablet   Oral   Take 2 mg by mouth daily as needed for diarrhea or loose stools.          BP 122/53  Pulse 67  Temp(Src) 98.4 F (36.9 C) (Oral)  Resp 29  SpO2 94% Physical Exam  Nursing note and vitals reviewed. Constitutional: She is oriented to person, place, and time. She appears well-developed and well-nourished. No distress.  HENT:  Head: Normocephalic and atraumatic.  Eyes: EOM are normal. Pupils are equal, round, and reactive to light.  Neck: Normal range of motion. Neck supple.  Cardiovascular: Normal rate and regular rhythm.  Exam reveals no friction rub.   No murmur heard. Pulmonary/Chest: Effort normal. No respiratory distress. She has wheezes (expiratory). She has no rales. She exhibits no tenderness.  Abdominal: Soft. She exhibits no distension. There is no tenderness. There is no rebound.  Musculoskeletal: Normal range of motion. She exhibits no edema.  Neurological: She is alert and oriented to person, place, and time.  Skin: She is not diaphoretic.    ED Course  Procedures (including critical care time) Labs Review Labs Reviewed  CBC - Abnormal; Notable for the following:    Hemoglobin 10.9 (*)    HCT 34.0 (*)    All other components within normal limits  BASIC METABOLIC PANEL - Abnormal; Notable for the following:    Glucose, Bld 157 (*)    All other components  within  normal limits  PRO B NATRIURETIC PEPTIDE - Abnormal; Notable for the following:    Pro B Natriuretic peptide (BNP) 1371.0 (*)    All other components within normal limits  CULTURE, BLOOD (ROUTINE X 2)  CULTURE, BLOOD (ROUTINE X 2)  POCT I-STAT TROPONIN I   Imaging Review Dg Chest Port 1 View  05/26/2013   CLINICAL DATA:  Nausea, vomiting  EXAM: PORTABLE CHEST - 1 VIEW  COMPARISON:  03/17/2013  FINDINGS: Cardiomegaly again noted. Dual lead cardiac pacemaker is unchanged in position. No pulmonary edema. Streaky left base atelectasis or infiltrate.  IMPRESSION: No pulmonary edema. Stable dual lead cardiac pacemaker position. Streaky left basilar atelectasis or infiltrate.   Electronically Signed   By: Natasha Mead M.D.   On: 05/26/2013 11:32    EKG Interpretation    Date/Time:  Wednesday May 26 2013 11:15:07 EST Ventricular Rate:  65 PR Interval:  153 QRS Duration: 90 QT Interval:  435 QTC Calculation: 452 R Axis:   73 Text Interpretation:  Sinus rhythm Similar to previous Confirmed by Gwendolyn Grant  MD, Alba Perillo ([id number]) on 05/26/2013 11:31:53 AM            MDM   1. Community acquired pneumonia   2. Hypoxia    20F presents with cough. Coughing since last night, productive, however unable to cough up secretions. No fevers. Nausea secondary to her coughing. No fevers, no chest pain. No vomiting. Zofran given by EMS for nausea.  Here lungs with some expiratory wheezes, no hx of asthma/COPD. No rhonchi. Will obtain CXR and labs. EKG normal.  CXR shows pneumonia, as she is looking well and likely going home, will give CAP coverage. Hypoxic with ambulation. Will admit.   Dagmar Hait, MD 05/26/13 [phone]

## 2013-05-26 NOTE — ED Notes (Signed)
Pt c/o nausea, requesting meds. MD notified.

## 2013-05-26 NOTE — ED Notes (Signed)
Pharmacy tech at bedside 

## 2013-05-26 NOTE — H&P (Addendum)
[facility] History and Physical  Hailey [name] NWG:[id number] DOB: 03/06/1946 DOA: 05/26/2013  Referring physician: ED PCP: [name], MD   Chief Complaint:  Progressive Cough with shortness of breath since one day Vomiting since one day  HPI:  67 year old female with history of diastolic dysfunction, A. fib on anticoagulation, s/p cardioversion, hx of PPM, diabetes mellitus, hyperthyroidism who presented to ED with cough since 4 days which has progressively become worse since yesterday. He has been increasingly short of breath and has associated subjective fevers and chills. She also reports nasal congestion. She reports chest congestion with pain on coughing and back pain. She reports having 2-3 episodes of vomiting aggravated by cough. She denies any headache, blurred vision, dizziness, chest pain, palpitations, abdominal pain, bowel or urinary symptoms. Denies joint pains. Denies any sick contacts or recent travel. She was taking Flomax and the season.  Patient was tachypneic on presentation. Chest x-ray showed left-sided infiltrate. Patient was given a dose of IV Rocephin and azithromycin and plan for discharge however patient became hypoxic to 80s on trying to ambulate. [facility] called for admission.   Review of Systems:  Constitutional: Subjective fever, chills, denies diaphoresis, appetite change and fatigue.  HEENT: Cough, nasal congestion, Denies photophobia, eye pain, redness, hearing loss, ear pain,  sore throat, rhinorrhea, sneezing, mouth sores, trouble swallowing, neck pain, neck stiffness and tinnitus.   Respiratory: SOB, DOE, cough, denies chest tightness,  and wheezing.   Cardiovascular: Denies chest pain, palpitations , leg swelling.  Gastrointestinal: nausea, vomiting, denies abdominal pain, diarrhea, constipation, blood in stool and abdominal distention.  Genitourinary: Denies dysuria, urgency, frequency, hematuria, flank pain and difficulty urinating.   Endocrine: Denies polyuria, polydipsia. Musculoskeletal:myalgias, back pain, denies joint swelling, arthralgias and gait problem.  Skin: Denies pallor, rash and wound.  Neurological: Denies dizziness, seizures, syncope, weakness, light-headedness, numbness and headaches.  Hematological: Denies adenopathy. Reports Easy bruising, Psychiatric/Behavioral: Denies  confusion, nervousness, sleep disturbance and agitation   Past Medical History  Diagnosis Date  . Hypertension   . Paroxysmal atrial fibrillation   . Diabetes mellitus   . Allergic rhinitis   . DJD (degenerative joint disease)   . Bradycardia     s/p PPM by Dr [name]  . CAD (coronary artery disease)     70% RCA stenosis, treated medically  . Polio     as a child  . Pacemaker   . Hyperthyroidism   . Pneumonia     12  . Goiter     thyroid  . Acute on chronic diastolic CHF (congestive heart failure), NYHA class 1 04/29/2013   Past Surgical History  Procedure Laterality Date  . Cholecystectomy    . Total abdominal hysterectomy    . Pacemaker insertion  12/03/10    SJM Accent DR RF implanted by Dr [name]  . Arm surgery d/t fx Right 96  . Tee without cardioversion N/A 07/30/2012    Procedure: TRANSESOPHAGEAL ECHOCARDIOGRAM (TEE);  Surgeon: [name], MD;  Location: [facility] ENDOSCOPY;  Service: Cardiovascular;  Laterality: N/A;  h&p in file-hope  . Atrial fibrillation ablation  07/31/2012    PVI by Dr [name]  . Lumbar laminectomy/decompression microdiscectomy Right 12/23/2012    Procedure: Right L5-S1 Laminotomy for resection of synovial cyst;  Surgeon: [name], MD;  Location: [facility] NEURO ORS;  Service: Neurosurgery;  Laterality: Right;  Right Lumbar five-sacral one Laminotomy for resection of synovial cyst   Social History:  reports that she has never smoked. She has never used  smokeless tobacco. She reports that she does not drink alcohol or use illicit drugs.  Allergies  Allergen Reactions  . Codeine Nausea And  Vomiting  . Demerol Nausea And Vomiting  . Ketorolac Other (See Comments)    "pt told to never take this med"    Family History  Problem Relation Age of Onset  . Heart disease Sister   . Breast cancer Sister   . Breast cancer Sister     Prior to Admission medications   Medication Sig Start Date End Date Taking? Authorizing Provider  acetaminophen (TYLENOL) 500 MG tablet Take 1,000 mg by mouth every 6 (six) hours as needed for pain.   Yes Historical Provider, MD  Calcium Carbonate-Vitamin D (CALCIUM 600+D) 600-400 MG-UNIT per tablet Take 1 tablet by mouth daily.    Yes Historical Provider, MD  Cholecalciferol (VITAMIN D3) 1000 UNITS CAPS Take 1 capsule by mouth daily.     Yes Historical Provider, MD  colesevelam (WELCHOL) 625 MG tablet Take 1,250 mg by mouth 2 (two) times daily with a meal.    Yes Historical Provider, MD  dabigatran (PRADAXA) 150 MG CAPS capsule Take 150 mg by mouth 2 (two) times daily.   Yes Historical Provider, MD  diltiazem (DILACOR XR) 120 MG 24 hr capsule Take 120 mg by mouth daily.   Yes Historical Provider, MD  furosemide (LASIX) 40 MG tablet Take 1 tablet (40 mg total) by mouth daily. 03/24/13  Yes Mihai Croitoru, MD  loratadine (ALLERGY RELIEF) 10 MG tablet Take 10 mg by mouth daily.   Yes Historical Provider, MD  metoprolol (LOPRESSOR) 50 MG tablet Take 50 mg by mouth 2 (two) times daily.   Yes Historical Provider, MD  pantoprazole (PROTONIX) 40 MG tablet Take 40 mg by mouth daily.    Yes Historical Provider, MD  propylthiouracil (PTU) 50 MG tablet Take 50-100 mg by mouth 2 (two) times daily. Take 2 tabs in the AM and 1 tab in the PM   Yes Historical Provider, MD  saxagliptin HCl (ONGLYZA) 5 MG TABS tablet Take 5 mg by mouth daily.   Yes Historical Provider, MD  valsartan (DIOVAN) 80 MG tablet Take 80 mg by mouth daily.   Yes Historical Provider, MD  benzonatate (TESSALON) 200 MG capsule Take 200 mg by mouth 3 (three) times daily as needed for cough.     Historical Provider, MD  loperamide (IMODIUM A-D) 2 MG tablet Take 2 mg by mouth daily as needed for diarrhea or loose stools.    Historical Provider, MD    Physical Exam:  Filed Vitals:   05/26/13 1245 05/26/13 1300 05/26/13 1400 05/26/13 1430  BP:  118/54 116/47 116/52  Pulse: 67 71 65 64  Temp:      TempSrc:      Resp: 29 18 22 22   SpO2: 94% 93% 98% 96%    Constitutional: Vital signs reviewed.  Patient is an elderly female lying in bed in no acute distress HEENT: No pallor, no icterus, moist oral mucosa Chest: Scattered rhonchi bilaterally, no crackles or wheeze CVS: Normal S1-S2, no murmurs rub or gallop Abdomen: Soft, nontender, nondistended, bowel sounds present Extremities: Warm, 1+ pitting edema bilaterally CNS: AAO x3   Labs on Admission:  Basic Metabolic Panel:  Recent Labs Lab 05/26/13 1132  NA 138  K 3.7  CL 101  CO2 26  GLUCOSE 157*  BUN 7  CREATININE 0.62  CALCIUM 9.3   Liver Function Tests: No results found for this basename: AST,  ALT, ALKPHOS, BILITOT, PROT, ALBUMIN,  in the last 168 hours No results found for this basename: LIPASE, AMYLASE,  in the last 168 hours No results found for this basename: AMMONIA,  in the last 168 hours CBC:  Recent Labs Lab 05/26/13 1132  WBC 10.2  HGB 10.9*  HCT 34.0*  MCV 83.3  PLT 255   Cardiac Enzymes: No results found for this basename: CKTOTAL, CKMB, CKMBINDEX, TROPONINI,  in the last 168 hours BNP: No components found with this basename: POCBNP,  CBG: No results found for this basename: GLUCAP,  in the last 168 hours  Radiological Exams on Admission: Dg Chest Port 1 View  05/26/2013   CLINICAL DATA:  Nausea, vomiting  EXAM: PORTABLE CHEST - 1 VIEW  COMPARISON:  03/17/2013  FINDINGS: Cardiomegaly again noted. Dual lead cardiac pacemaker is unchanged in position. No pulmonary edema. Streaky left base atelectasis or infiltrate.  IMPRESSION: No pulmonary edema. Stable dual lead cardiac pacemaker position.  Streaky left basilar atelectasis or infiltrate.   Electronically Signed   By: Natasha Mead M.D.   On: 05/26/2013 11:32      Assessment/Plan Principal Problem:   Community acquired pneumonia Admit to telemetry IV Rocephin and azithromycin in the ED which will be continued. Follow blood culture, urine strep antigen Legionella antigen, -Supportive care with Tylenol, antitussives and albuterol nebs. Continue O2 via nasal cannula.  Active Problems: Flulike symptoms. Check flu  PCR. I will place her on empiric tamiflu     Atrial fibrillation Rate Controlled. Continue pradaxa     Diastolic CHF Stable. Reports dyspnea on exertion which is likely related to CAP than her CHF. Has trace leg edema and elevated pro BNP. Follows  with Dr [name]. i will add him as consult. Continue metoprolol, home dose  Lasix and ARB  Hyperthyroidism Continue PTU  GERD Continue PPI  Hypertension Continue home medications  Diabetes  continue home meds. SSI  DIET: DIABETIC  Daily prophylaxis: On therapeutic anticoagulation  Code Status: Full code Family Communication: None at bedside Disposition Plan:home once improved  Katerine Morua [facility] Pager [phone]  If 7PM-7AM, please contact night-coverage [email or web address] Password [email or web address] 05/26/2013, 2:52 PM   Total time spent on admission: 70 minutes

## 2013-05-26 NOTE — ED Notes (Signed)
Pt handed another emesis bag; family at bedside

## 2013-05-26 NOTE — ED Notes (Signed)
Pt given warm blankets family at bedside

## 2013-05-26 NOTE — ED Notes (Addendum)
Pt ambulated to restroom, O2 sats decreased to 88%. Placed pt back on oxygen, O2 sats raised to 97%.

## 2013-05-27 DIAGNOSIS — J189 Pneumonia, unspecified organism: Secondary | ICD-10-CM | POA: Diagnosis not present

## 2013-05-27 DIAGNOSIS — J111 Influenza due to unidentified influenza virus with other respiratory manifestations: Secondary | ICD-10-CM

## 2013-05-27 DIAGNOSIS — J101 Influenza due to other identified influenza virus with other respiratory manifestations: Secondary | ICD-10-CM | POA: Diagnosis present

## 2013-05-27 DIAGNOSIS — R197 Diarrhea, unspecified: Secondary | ICD-10-CM | POA: Diagnosis not present

## 2013-05-27 DIAGNOSIS — I4891 Unspecified atrial fibrillation: Secondary | ICD-10-CM | POA: Diagnosis not present

## 2013-05-27 DIAGNOSIS — R112 Nausea with vomiting, unspecified: Secondary | ICD-10-CM | POA: Diagnosis present

## 2013-05-27 LAB — EXPECTORATED SPUTUM ASSESSMENT W GRAM STAIN, RFLX TO RESP C: Special Requests: NORMAL

## 2013-05-27 LAB — GLUCOSE, CAPILLARY
Glucose-Capillary: 140 mg/dL — ABNORMAL HIGH (ref 70–99)
Glucose-Capillary: 142 mg/dL — ABNORMAL HIGH (ref 70–99)
Glucose-Capillary: 104 mg/dL — ABNORMAL HIGH (ref 70–99)
Glucose-Capillary: 127 mg/dL — ABNORMAL HIGH (ref 70–99)
Glucose-Capillary: 85 mg/dL (ref 70–99)

## 2013-05-27 LAB — LEGIONELLA ANTIGEN, URINE: Legionella Antigen, Urine: NEGATIVE

## 2013-05-27 LAB — INFLUENZA PANEL BY PCR (TYPE A & B)
Influenza A By PCR: POSITIVE — AB
Influenza B By PCR: NEGATIVE

## 2013-05-27 MED ORDER — HYDROCODONE-ACETAMINOPHEN 5-325 MG PO TABS
1.0000 | ORAL_TABLET | Freq: Four times a day (QID) | ORAL | Status: DC | PRN
  Administered 2013-05-27: 1 via ORAL
  Filled 2013-05-27: qty 1

## 2013-05-27 MED ORDER — LOPERAMIDE HCL 2 MG PO CAPS
2.0000 mg | ORAL_CAPSULE | Freq: Three times a day (TID) | ORAL | Status: DC | PRN
  Administered 2013-05-27: 2 mg via ORAL
  Filled 2013-05-27: qty 1

## 2013-05-27 MED ORDER — ACETAMINOPHEN 325 MG PO TABS: 650.0000 mg | ORAL_TABLET | Freq: Four times a day (QID) | ORAL | Status: DC | PRN

## 2013-05-27 NOTE — Progress Notes (Signed)
Pt c/o nausea PRN zofran given, pt then c/o c/p pt stated she felt like she couldn't get any air, O2 87% on room air, pt put on 3L to recover then on 2L and maintained O2 at 100%, EKG done, BP 155/63, Dr. Waymon Amato notified and PRN Vicodin given as ordered, pt stable, will continue to monitor

## 2013-05-27 NOTE — Progress Notes (Signed)
[facility] PROGRESS NOTE    Hailey [name] WUJ:[id number] DOB: September 24, 1945 DOA: 05/26/2013 PCP: [name], MD  HPI/Brief narrative Who 67 year old female with history of diastolic dysfunction, A. fib on anticoagulation, status post cardioversion, PPM, DM 2, hypothyroidism presented to the ED on 12/24 with 4 days history of feeling unwell-fevers, chills, nausea, nasal congestion, nonproductive cough, chest pain with coughing and wheezing. A day prior to admission, she had multiple episodes of non-bloody emesis-possibly posttussive. No sickly contacts. In the ED, chest x-ray showed left-sided atelectasis versus infiltrate. She was given a dose of IV Rocephin and azithromycin and EDP was planning to discharge her, patient however became hypoxic in the 80s with activity and was admitted for further management.  Assessment/Plan:  Influenza A - confirmed by PCR. Constitutional symptoms most likely secondary to same. - Continue Tamiflu and supportive care.  Possible community acquired versus acute viral bronchitis/acute hypoxic respiratory failure - Continue IV Rocephin and azithromycin - Continue supportive care including oxygen, cough medications and bronchodilator nebulizations. - Patient has some atypical chest pain related to dry heaving and coughing. - HIV antibody nonreactive. Urine Legionella and streptococcal antigen negative. - Blood cultures negative to date.  Nausea, vomiting and diarrhea - Likely related to influenza. Patient states that her nausea and vomiting have improved. She had 3-4 episodes of diarrhea this morning. - Treatment as above and monitor.  Atrial fibrillation - Currently in sinus rhythm/a paced at times - Continue home medications.  Chronic diastolic CHF - Seems compensated at this time. Continue home medications.  Hypertension - Controlled  Type II DM - Continue home medications when necessary.  Hyperthyroidism  Anemia -Follow CBCs  Code  Status: Full Family Communication: Discussed with spouse and son at bedside. Disposition Plan: Home when medically stable.   Consultants:  None  Procedures:  None  Antibiotics:  IV Rocephin and azithromycin 12/24 >   Subjective: Nausea and vomiting have improved. 3-4 episodes of diarrhea since 1:30 AM today. Cough and dyspnea have improved. No wheezing. Some anterior chest pain associated with dry heaves and coughing.  Objective: Filed Vitals:   05/26/13 2327 05/27/13 0708 05/27/13 0944 05/27/13 1216  BP: 140/56 133/51 129/51 155/63  Pulse: 80 73 73   Temp: 97.5 F (36.4 C) 98.4 F (36.9 C)    TempSrc: Oral Oral    Resp: 22 18    Height:      Weight:  71 kg (156 lb 8.4 oz)    SpO2: 97% 97% 93% 100%    Intake/Output Summary (Last 24 hours) at 05/27/13 1255 Last data filed at 05/27/13 0835  Gross per 24 hour  Intake    120 ml  Output    750 ml  Net   -630 ml   Filed Weights   05/26/13 1545 05/27/13 0708  Weight: 71.1 kg (156 lb 12 oz) 71 kg (156 lb 8.4 oz)     Exam:  General exam: Moderately built and nourished female sitting propped up in bed in no obvious distress Respiratory system: Slightly reduced breath sounds in the bases with occasional basal crackles. Rest of lung fields clear to auscultation. No increased work of breathing. Cardiovascular system: S1 & S2 heard, RRR. No JVD, murmurs, gallops, clicks or pedal edema. Telemetry: Sinus rhythm/a paced Gastrointestinal system: Abdomen is nondistended, soft and nontender. Normal bowel sounds heard. Central nervous system: Alert and oriented. No focal neurological deficits. Extremities: Symmetric 5 x 5 power.   Data Reviewed: Basic Metabolic Panel:  Recent Labs Lab 05/26/13  1132  NA 138  K 3.7  CL 101  CO2 26  GLUCOSE 157*  BUN 7  CREATININE 0.62  CALCIUM 9.3   Liver Function Tests: No results found for this basename: AST, ALT, ALKPHOS, BILITOT, PROT, ALBUMIN,  in the last 168 hours No results  found for this basename: LIPASE, AMYLASE,  in the last 168 hours No results found for this basename: AMMONIA,  in the last 168 hours CBC:  Recent Labs Lab 05/26/13 1132  WBC 10.2  HGB 10.9*  HCT 34.0*  MCV 83.3  PLT 255   Cardiac Enzymes: No results found for this basename: CKTOTAL, CKMB, CKMBINDEX, TROPONINI,  in the last 168 hours BNP (last 3 results)  Recent Labs  05/26/13 1132  PROBNP 1371.0*   CBG:  Recent Labs Lab 05/26/13 1634 05/26/13 2049 05/27/13 0601 05/27/13 1103  GLUCAP 165* 140* 142* 104*    Recent Results (from the past 240 hour(s))  CULTURE, BLOOD (ROUTINE X 2)     Status: None   Collection Time    05/26/13  2:15 PM      Result Value Range Status   Specimen Description BLOOD LEFT HAND   Final   Special Requests BOTTLES DRAWN AEROBIC ONLY 10CCS   Final   Culture  Setup Time     Final   Value: 05/26/2013 20:55     Performed at [facility]   Culture     Final   Value:        BLOOD CULTURE RECEIVED NO GROWTH TO DATE CULTURE WILL BE HELD FOR 5 DAYS BEFORE ISSUING A FINAL NEGATIVE REPORT     Performed at [facility]   Report Status PENDING   Incomplete  CULTURE, BLOOD (ROUTINE X 2)     Status: None   Collection Time    05/26/13  3:10 PM      Result Value Range Status   Specimen Description BLOOD ARM LEFT   Final   Special Requests BOTTLES DRAWN AEROBIC ONLY 10CC   Final   Culture  Setup Time     Final   Value: 05/26/2013 20:55     Performed at [facility]   Culture     Final   Value:        BLOOD CULTURE RECEIVED NO GROWTH TO DATE CULTURE WILL BE HELD FOR 5 DAYS BEFORE ISSUING A FINAL NEGATIVE REPORT     Performed at [facility]   Report Status PENDING   Incomplete       Studies: Dg Chest Port 1 View  05/26/2013   CLINICAL DATA:  Nausea, vomiting  EXAM: PORTABLE CHEST - 1 VIEW  COMPARISON:  03/17/2013  FINDINGS: Cardiomegaly again noted. Dual lead cardiac pacemaker is unchanged in position. No pulmonary  edema. Streaky left base atelectasis or infiltrate.  IMPRESSION: No pulmonary edema. Stable dual lead cardiac pacemaker position. Streaky left basilar atelectasis or infiltrate.   Electronically Signed   By: Natasha Mead M.D.   On: 05/26/2013 11:32        Scheduled Meds: . azithromycin  500 mg Oral Q24H  . calcium-vitamin D  1 tablet Oral Daily  . cefTRIAXone (ROCEPHIN)  IV  1 g Intravenous Q24H  . cholecalciferol  1,000 Units Oral Daily  . colesevelam  1,250 mg Oral BID WC  . dabigatran  150 mg Oral BID  . dextromethorphan-guaiFENesin  1 tablet Oral BID  . diltiazem  120 mg Oral Daily  . furosemide  40  mg Oral Daily  . insulin aspart  0-15 Units Subcutaneous TID WC  . irbesartan  75 mg Oral Daily  . levalbuterol  0.63 mg Nebulization TID  . linagliptin  5 mg Oral Daily  . loratadine  10 mg Oral Daily  . metoprolol  50 mg Oral BID  . oseltamivir  30 mg Oral BID  . pantoprazole  40 mg Oral Daily  . propylthiouracil  100 mg Oral q morning - 10a   And  . propylthiouracil  50 mg Oral QPM   Continuous Infusions:   Principal Problem:   Community acquired pneumonia Active Problems:   Atrial fibrillation   Bilateral leg edema   Diastolic CHF   Flu-like symptoms   Community acquired bacterial pneumonia    Time spent: 45 minutes    Sandee Bernath, MD, FACP, FHM. [facility] Pager [phone]  If 7PM-7AM, please contact night-coverage [email or web address] Password [facility] 05/27/2013, 12:55 PM    LOS: 1 day

## 2013-05-28 ENCOUNTER — Encounter (HOSPITAL_COMMUNITY): Payer: Self-pay | Admitting: Cardiology

## 2013-05-28 DIAGNOSIS — J111 Influenza due to unidentified influenza virus with other respiratory manifestations: Secondary | ICD-10-CM | POA: Diagnosis not present

## 2013-05-28 DIAGNOSIS — I4891 Unspecified atrial fibrillation: Secondary | ICD-10-CM | POA: Diagnosis not present

## 2013-05-28 DIAGNOSIS — R0902 Hypoxemia: Secondary | ICD-10-CM

## 2013-05-28 DIAGNOSIS — J189 Pneumonia, unspecified organism: Secondary | ICD-10-CM | POA: Diagnosis not present

## 2013-05-28 DIAGNOSIS — I5033 Acute on chronic diastolic (congestive) heart failure: Secondary | ICD-10-CM | POA: Diagnosis not present

## 2013-05-28 DIAGNOSIS — I509 Heart failure, unspecified: Secondary | ICD-10-CM | POA: Diagnosis not present

## 2013-05-28 LAB — BASIC METABOLIC PANEL
BUN: 9 mg/dL (ref 6–23)
CO2: 30 mEq/L (ref 19–32)
Calcium: 8.5 mg/dL (ref 8.4–10.5)
Creatinine, Ser: 0.74 mg/dL (ref 0.50–1.10)
GFR calc Af Amer: >90 mL/min (ref >90)
GFR calc non Af Amer: 85 mL/min — ABNORMAL LOW (ref >90)
Glucose, Bld: 91 mg/dL (ref 70–99)
Potassium: 3.3 mEq/L — ABNORMAL LOW (ref 3.5–5.1)
Sodium: 142 mEq/L (ref 135–145)

## 2013-05-28 LAB — CBC
Hemoglobin: 10 g/dL — ABNORMAL LOW (ref 12.0–15.0)
MCH: 25.8 pg — ABNORMAL LOW (ref 26.0–34.0)
MCHC: 29.9 g/dL — ABNORMAL LOW (ref 30.0–36.0)
MCV: 86.6 fL (ref 78.0–100.0)
Platelets: 215 10*3/uL (ref 150–400)
RBC: 3.87 MIL/uL (ref 3.87–5.11)
RDW: 14.8 % (ref 11.5–15.5)

## 2013-05-28 LAB — GLUCOSE, CAPILLARY
Glucose-Capillary: 89 mg/dL (ref 70–99)
Glucose-Capillary: 88 mg/dL (ref 70–99)

## 2013-05-28 MED ORDER — POTASSIUM CHLORIDE CRYS ER 20 MEQ PO TBCR
40.0000 meq | EXTENDED_RELEASE_TABLET | Freq: Once | ORAL | Status: AC
  Administered 2013-05-28: 40 meq via ORAL
  Filled 2013-05-28: qty 2

## 2013-05-28 MED ORDER — DILTIAZEM HCL ER 180 MG PO CP24
180.0000 mg | ORAL_CAPSULE | Freq: Every day | ORAL | Status: DC
  Filled 2013-05-28 (×2): qty 1

## 2013-05-28 MED ORDER — OSELTAMIVIR PHOSPHATE 75 MG PO CAPS
75.0000 mg | ORAL_CAPSULE | Freq: Every day | ORAL | Status: DC
  Administered 2013-05-29: 75 mg via ORAL
  Filled 2013-05-28: qty 1

## 2013-05-28 NOTE — Consult Note (Signed)
Reason for Consult: Atrial fib, flu symptoms  Referring Physician: Dr. Waymon Amato PCP:KIM, Fayrene Fearing, MD Primary Cardiologist:Dr. Croitoru   Hailey [name] is an 67 y.o. female.    Chief Complaint:    HPI: 67 year old female with history of diastolic dysfunction, A. fib on anticoagulation, s/p cardioversion, and ablation by Dr. [name]  hx of PPM, diabetes mellitus, hyperthyroidism who presented to ED 05/26/13 with cough since 4 days which has progressively become worse since yesterday. He has been increasingly short of breath and has associated subjective fevers and chills. She also reports nasal congestion. She reports chest congestion with pain on coughing and back pain. She reports having 2-3 episodes of vomiting aggravated by cough. She denies any headache, blurred vision, dizziness, chest pain, palpitations, abdominal pain, bowel or urinary symptoms. Denies joint pains. Denies any sick contacts or recent travel. She was taking Flomax and the season.  Patient was tachypneic on presentation. Chest x-ray showed left-sided infiltrate. Patient was given a dose of IV Rocephin and azithromycin and plan for discharge however patient became hypoxic to 80s on trying to ambulate.  She has + inluenza A screen.   She has a hx of fairly new diastolic heart failure. Her echocardiogram of 04/08/13,  confirms that she still has normal left ventricular systolic function but also showed all the typical findings of elevated left atrial pressure. She had moderate pulmonary hypertension with an estimated systolic PA P. of 50 mm Hg. We stopped Multaq. She has subsequently had a substantial increase in the prevalence of atrial fibrillation, which is pretty much back to the 20% rate that she had before her ablation. She no longer has shortness of breath or edema. She experiences primarily fatigue when she has atrial fibrillation.  She continues on Pradaxa and was going to let us know if she  preferred another antiarrythmic vs. Repeat ablation. She has hx of CAD treated Medically.  She was also seen by Dr. [name] on the 19th.  He recommended Tikosyn or repeat ablation.  She is trying to decide.  Here mostly SR but some A flutter-asymptomatic.  DId have some ant chest pain yesterday with Dry heaves.  None today. Trop neg on admit.  No chest pain prior to admit.  Most likely will need home oxygen.     Past Medical History  Diagnosis Date  . Hypertension   . Paroxysmal atrial fibrillation   . Diabetes mellitus   . Allergic rhinitis   . DJD (degenerative joint disease)   . Bradycardia     s/p PPM by Dr [name]  . CAD (coronary artery disease)     70% RCA stenosis, treated medically  . Polio     as a child  . Pacemaker   . Hyperthyroidism   . Pneumonia     12  . Goiter     thyroid  . Acute on chronic diastolic CHF (congestive heart failure), NYHA class 1 04/29/2013    Past Surgical History  Procedure Laterality Date  . Cholecystectomy    . Total abdominal hysterectomy    . Pacemaker insertion  12/03/10    SJM [employer] DR RF implanted by Dr [name]  . Arm surgery d/t fx Right 96  . Tee without cardioversion N/A 07/30/2012    Procedure: TRANSESOPHAGEAL ECHOCARDIOGRAM (TEE);  Surgeon: [name], MD;  Location: [facility] ENDOSCOPY;  Service: Cardiovascular;  Laterality: N/A;  h&p in file-hope  . Atrial fibrillation ablation  07/31/2012  PVI by Dr [name]  . Lumbar laminectomy/decompression microdiscectomy Right 12/23/2012    Procedure: Right L5-S1 Laminotomy for resection of synovial cyst;  Surgeon: [name], MD;  Location: [facility] NEURO ORS;  Service: Neurosurgery;  Laterality: Right;  Right Lumbar five-sacral one Laminotomy for resection of synovial cyst    Family History  Problem Relation Age of Onset  . Heart disease Sister   . Breast cancer Sister   . Breast cancer Sister    Social History:  reports that she has never smoked. She has never used smokeless tobacco.  She reports that she does not drink alcohol or use illicit drugs.  Allergies:  Allergies  Allergen Reactions  . Codeine Nausea And Vomiting  . Demerol Nausea And Vomiting  . Ketorolac Other (See Comments)    "pt told to never take this med"    Medications Prior to Admission  Medication Sig Dispense Refill  . acetaminophen (TYLENOL) 500 MG tablet Take 1,000 mg by mouth every 6 (six) hours as needed for pain.      . Calcium Carbonate-Vitamin D (CALCIUM 600+D) 600-400 MG-UNIT per tablet Take 1 tablet by mouth daily.       . Cholecalciferol (VITAMIN D3) 1000 UNITS CAPS Take 1 capsule by mouth daily.        . colesevelam (WELCHOL) 625 MG tablet Take 1,250 mg by mouth 2 (two) times daily with a meal.       . dabigatran (PRADAXA) 150 MG CAPS capsule Take 150 mg by mouth 2 (two) times daily.      Marland Kitchen diltiazem (DILACOR XR) 120 MG 24 hr capsule Take 120 mg by mouth daily.      . furosemide (LASIX) 40 MG tablet Take 1 tablet (40 mg total) by mouth daily.  30 tablet  5  . loratadine (ALLERGY RELIEF) 10 MG tablet Take 10 mg by mouth daily.      . metoprolol (LOPRESSOR) 50 MG tablet Take 50 mg by mouth 2 (two) times daily.      . pantoprazole (PROTONIX) 40 MG tablet Take 40 mg by mouth daily.       Marland Kitchen propylthiouracil (PTU) 50 MG tablet Take 50-100 mg by mouth 2 (two) times daily. Take 2 tabs in the AM and 1 tab in the PM      . saxagliptin HCl (ONGLYZA) 5 MG TABS tablet Take 5 mg by mouth daily.      . valsartan (DIOVAN) 80 MG tablet Take 80 mg by mouth daily.      . benzonatate (TESSALON) 200 MG capsule Take 200 mg by mouth 3 (three) times daily as needed for cough.      . loperamide (IMODIUM A-D) 2 MG tablet Take 2 mg by mouth daily as needed for diarrhea or loose stools.        Results for orders placed during the hospital encounter of 05/26/13 (from the past 48 hour(s))  CBC     Status: Abnormal   Collection Time    05/26/13 11:32 AM      Result Value Range   WBC 10.2  4.0 - 10.5 K/uL   RBC  4.08  3.87 - 5.11 MIL/uL   Hemoglobin 10.9 (*) 12.0 - 15.0 g/dL   HCT 16.1 (*) 09.6 - 04.5 %   MCV 83.3  78.0 - 100.0 fL   MCH 26.7  26.0 - 34.0 pg   MCHC 32.1  30.0 - 36.0 g/dL   RDW 40.9  81.1 - 91.4 %  Platelets 255  150 - 400 K/uL  BASIC METABOLIC PANEL     Status: Abnormal   Collection Time    05/26/13 11:32 AM      Result Value Range   Sodium 138  135 - 145 mEq/L   Potassium 3.7  3.5 - 5.1 mEq/L   Chloride 101  96 - 112 mEq/L   CO2 26  19 - 32 mEq/L   Glucose, Bld 157 (*) 70 - 99 mg/dL   BUN 7  6 - 23 mg/dL   Creatinine, Ser 1.61  0.50 - 1.10 mg/dL   Calcium 9.3  8.4 - 09.6 mg/dL   GFR calc non Af Amer >90  >90 mL/min   GFR calc Af Amer >90  >90 mL/min   Comment: (NOTE)     The eGFR has been calculated using the CKD EPI equation.     This calculation has not been validated in all clinical situations.     eGFR's persistently <90 mL/min signify possible Chronic Kidney     Disease.  PRO B NATRIURETIC PEPTIDE     Status: Abnormal   Collection Time    05/26/13 11:32 AM      Result Value Range   Pro B Natriuretic peptide (BNP) 1371.0 (*) 0 - 125 pg/mL  POCT I-STAT TROPONIN I     Status: None   Collection Time    05/26/13 11:49 AM      Result Value Range   Troponin i, poc 0.00  0.00 - 0.08 ng/mL   Comment 3            Comment: Due to the release kinetics of cTnI,     a negative result within the first hours     of the onset of symptoms does not rule out     myocardial infarction with certainty.     If myocardial infarction is still suspected,     repeat the test at appropriate intervals.  CULTURE, BLOOD (ROUTINE X 2)     Status: None   Collection Time    05/26/13  2:15 PM      Result Value Range   Specimen Description BLOOD LEFT HAND     Special Requests BOTTLES DRAWN AEROBIC ONLY 10CCS     Culture  Setup Time       Value: 05/26/2013 20:55     Performed at [facility]   Culture       Value:        BLOOD CULTURE RECEIVED NO GROWTH TO DATE CULTURE WILL BE  HELD FOR 5 DAYS BEFORE ISSUING A FINAL NEGATIVE REPORT     Performed at [facility]   Report Status PENDING    CULTURE, BLOOD (ROUTINE X 2)     Status: None   Collection Time    05/26/13  3:10 PM      Result Value Range   Specimen Description BLOOD ARM LEFT     Special Requests BOTTLES DRAWN AEROBIC ONLY 10CC     Culture  Setup Time       Value: 05/26/2013 20:55     Performed at [facility]   Culture       Value:        BLOOD CULTURE RECEIVED NO GROWTH TO DATE CULTURE WILL BE HELD FOR 5 DAYS BEFORE ISSUING A FINAL NEGATIVE REPORT     Performed at [facility]   Report Status PENDING    HIV ANTIBODY (ROUTINE TESTING)  Status: None   Collection Time    05/26/13  4:30 PM      Result Value Range   HIV NON REACTIVE  NON REACTIVE   Comment: Performed at [facility]  GLUCOSE, CAPILLARY     Status: Abnormal   Collection Time    05/26/13  4:34 PM      Result Value Range   Glucose-Capillary 165 (*) 70 - 99 mg/dL   Comment 1 Notify RN    INFLUENZA PANEL BY PCR     Status: Abnormal   Collection Time    05/26/13  5:50 PM      Result Value Range   Influenza A By PCR POSITIVE (*) NEGATIVE   Influenza B By PCR NEGATIVE  NEGATIVE   H1N1 flu by pcr NOT DETECTED  NOT DETECTED   Comment:            The Xpert Flu assay (FDA approved for     nasal aspirates or washes and     nasopharyngeal swab specimens), is     intended as an aid in the diagnosis of     influenza and should not be used as     a sole basis for treatment.  LEGIONELLA ANTIGEN, URINE     Status: None   Collection Time    05/26/13  5:50 PM      Result Value Range   Specimen Description URINE, RANDOM     Special Requests NONE     Legionella Antigen, Urine       Value: Negative for Legionella pneumophilia serogroup 1     Performed at [facility]   Report Status 05/27/2013 FINAL    STREP PNEUMONIAE URINARY ANTIGEN     Status: None   Collection Time    05/26/13  5:50 PM       Result Value Range   Strep Pneumo Urinary Antigen NEGATIVE  NEGATIVE   Comment:            Infection due to S. pneumoniae     cannot be absolutely ruled out     since the antigen present     may be below the detection limit     of the test.  GLUCOSE, CAPILLARY     Status: Abnormal   Collection Time    05/26/13  8:49 PM      Result Value Range   Glucose-Capillary 140 (*) 70 - 99 mg/dL   Comment 1 Notify RN     Comment 2 Documented in Chart    GLUCOSE, CAPILLARY     Status: Abnormal   Collection Time    05/27/13  6:01 AM      Result Value Range   Glucose-Capillary 142 (*) 70 - 99 mg/dL  GLUCOSE, CAPILLARY     Status: Abnormal   Collection Time    05/27/13 11:03 AM      Result Value Range   Glucose-Capillary 104 (*) 70 - 99 mg/dL   Comment 1 Notify RN    CULTURE, EXPECTORATED SPUTUM-ASSESSMENT     Status: None   Collection Time    05/27/13 12:06 PM      Result Value Range   Specimen Description SPUTUM     Special Requests Normal     Sputum evaluation       Value: THIS SPECIMEN IS ACCEPTABLE. RESPIRATORY CULTURE REPORT TO FOLLOW.   Report Status 05/27/2013 FINAL    CULTURE, RESPIRATORY (NON-EXPECTORATED)     Status: None  Collection Time    05/27/13 12:06 PM      Result Value Range   Specimen Description SPUTUM     Special Requests NONE     Gram Stain       Value: MODERATE WBC PRESENT,BOTH PMN AND MONONUCLEAR     NO SQUAMOUS EPITHELIAL CELLS SEEN     NO ORGANISMS SEEN     Performed at [facility]   Culture PENDING     Report Status PENDING    GLUCOSE, CAPILLARY     Status: Abnormal   Collection Time    05/27/13  4:04 PM      Result Value Range   Glucose-Capillary 127 (*) 70 - 99 mg/dL   Comment 1 Notify RN    GLUCOSE, CAPILLARY     Status: None   Collection Time    05/27/13  8:56 PM      Result Value Range   Glucose-Capillary 85  70 - 99 mg/dL   Comment 1 Notify RN    CBC     Status: Abnormal   Collection Time    05/28/13  5:50 AM      Result Value  Range   WBC 4.4  4.0 - 10.5 K/uL   RBC 3.87  3.87 - 5.11 MIL/uL   Hemoglobin 10.0 (*) 12.0 - 15.0 g/dL   HCT 16.1 (*) 09.6 - 04.5 %   MCV 86.6  78.0 - 100.0 fL   MCH 25.8 (*) 26.0 - 34.0 pg   MCHC 29.9 (*) 30.0 - 36.0 g/dL   RDW 40.9  81.1 - 91.4 %   Platelets 215  150 - 400 K/uL  BASIC METABOLIC PANEL     Status: Abnormal   Collection Time    05/28/13  5:50 AM      Result Value Range   Sodium 142  135 - 145 mEq/L   Potassium 3.3 (*) 3.5 - 5.1 mEq/L   Chloride 103  96 - 112 mEq/L   CO2 30  19 - 32 mEq/L   Glucose, Bld 91  70 - 99 mg/dL   BUN 9  6 - 23 mg/dL   Creatinine, Ser 7.82  0.50 - 1.10 mg/dL   Calcium 8.5  8.4 - 95.6 mg/dL   GFR calc non Af Amer 85 (*) >90 mL/min   GFR calc Af Amer >90  >90 mL/min   Comment: (NOTE)     The eGFR has been calculated using the CKD EPI equation.     This calculation has not been validated in all clinical situations.     eGFR's persistently <90 mL/min signify possible Chronic Kidney     Disease.  GLUCOSE, CAPILLARY     Status: None   Collection Time    05/28/13  6:33 AM      Result Value Range   Glucose-Capillary 89  70 - 99 mg/dL   Comment 1 Notify RN     Comment 2 Documented in Chart     Dg Chest Port 1 View  05/26/2013   CLINICAL DATA:  Nausea, vomiting  EXAM: PORTABLE CHEST - 1 VIEW  COMPARISON:  03/17/2013  FINDINGS: Cardiomegaly again noted. Dual lead cardiac pacemaker is unchanged in position. No pulmonary edema. Streaky left base atelectasis or infiltrate.  IMPRESSION: No pulmonary edema. Stable dual lead cardiac pacemaker position. Streaky left basilar atelectasis or infiltrate.   Electronically Signed   By: Natasha Mead M.D.   On: 05/26/2013 11:32    ROS: General:no colds or  fevers, no weight changes Skin:no rashes or ulcers HEENT:no blurred vision, no congestion CV:see HPI PUL:see HPI GI:no diarrhea constipation or melena, no indigestion GU:no hematuria, no dysuria MS:no joint pain, no claudication Neuro:no syncope, no  lightheadedness Endo:no diabetes, no thyroid disease   Blood pressure 107/52, pulse 68, temperature 97.9 F (36.6 C), temperature source Oral, resp. rate 18, height 4\' 11"  (1.499 m), weight 154 lb 12.2 oz (70.2 kg), SpO2 96.00%. PE: General:Pleasant affect, NAD, with cough Skin:Warm and dry, brisk capillary refill HEENT:normocephalic, sclera clear, mucus membranes moist Neck:supple, no JVD, no bruits  Heart:S1S2 RRR without murmur, gallup, rub or click Lungs: without rales, but + rhonchi, and wheezes ZOX:WRUE, non tender, + BS, do not palpate liver spleen or masses Ext:no lower ext edema, 2+ pedal pulses, 2+ radial pulses Neuro:alert and oriented, MAE, follows commands, + facial symmetry    Assessment/Plan Principal Problem:   Influenza A Active Problems:   Atrial fibrillation   Bilateral leg edema   Diastolic CHF   Community acquired pneumonia   Community acquired bacterial pneumonia   Nausea, vomiting, and diarrhea  PLAN: Has been in and out a flutter.  Rate controlled and anticoagulated.  Chest pain seems to be chest wall pain with severe N&V and dry heaves.  Slowly improving.  Possible discharge tomorrow.  [facility]  Nurse Practitioner Certified [facility] Pager [phone] or after 5pm or weekends call [phone] 05/28/2013, 8:31 AM   Patient seen and examined. Agree with assessment and plan. Pt presently is in AF with controlled response at 70 - 80. Chest pain is costochondral probably exacerbated by coughing and vomiting. No ischemic symptoms. No chf on exam. On pradaxa for AC. Will increase cardizem to 180 mg from 120 mg. Continue BB.   Lennette Bihari, MD, [facility] 05/28/2013 10:46 AM

## 2013-05-28 NOTE — Progress Notes (Signed)
[facility] PROGRESS NOTE    Hailey [name] ZOX:[id number] DOB: 07/25/1945 DOA: 05/26/2013 PCP: [name], MD  HPI/Brief narrative Who 67 year old female with history of diastolic dysfunction, A. fib on anticoagulation, status post cardioversion, PPM, DM 2, hypothyroidism presented to the ED on 12/24 with 4 days history of feeling unwell-fevers, chills, nausea, nasal congestion, nonproductive cough, chest pain with coughing and wheezing. A day prior to admission, she had multiple episodes of non-bloody emesis-possibly posttussive. No sickly contacts. In the ED, chest x-ray showed left-sided atelectasis versus infiltrate. She was given a dose of IV Rocephin and azithromycin and EDP was planning to discharge her, patient however became hypoxic in the 80s with activity and was admitted for further management.  Assessment/Plan:  Influenza A - confirmed by PCR. Constitutional symptoms most likely secondary to same. - Continue Tamiflu and supportive care.  Possible community acquired versus acute viral bronchitis/acute hypoxic respiratory failure - Continue IV Rocephin and azithromycin - Continue supportive care including oxygen, cough medications and bronchodilator nebulizations. - Atypical chest pain related to dry heaving and coughing has resolved. EKG 12/25 without acute changes. - HIV antibody nonreactive. Urine Legionella and streptococcal antigen negative. - Blood cultures negative to date.  Nausea, vomiting and diarrhea - Likely related to influenza. Resolved since 12/25 - Treatment as above and monitor.  Atrial fibrillation, status post cardioversion and ablation - Currently in sinus rhythm/a paced at times - Continue home medications. Cardiology consultation appreciated. Per notes, substantially increase in the prevalence of A. fib - Patient is contemplating her options of continued medical treatment versus repeat ablation - Cardiology increasing Cardizem from 180-120 mg.  Continue beta blockers and Pradaxa.  Chronic diastolic CHF - Seems compensated at this time. Continue home medications.  Hypertension - Controlled  Type II DM - Continue home medications.  Hyperthyroidism  Anemia -Stable.  Hypokalemia - Replete and follow  Code Status: Full Family Communication: Discussed with spouse at bedside. Disposition Plan: Home when medically stable-possibly 12/27.   Consultants:  None  Procedures:  None  Antibiotics:  IV Rocephin and azithromycin 12/24 >   Subjective: And feels much better. Denies dyspnea. Cough improved and minimal. No further nausea, vomiting or diarrhea. Tolerating diet. Denies chest pain.  Objective: Filed Vitals:   05/27/13 2233 05/28/13 0515 05/28/13 0829 05/28/13 1325  BP:  107/52    Pulse: 79 68    Temp:  97.9 F (36.6 C)    TempSrc:  Oral    Resp:  18    Height:      Weight:  70.2 kg (154 lb 12.2 oz)    SpO2:  99% 96% 95%    Intake/Output Summary (Last 24 hours) at 05/28/13 1445 Last data filed at 05/28/13 1419  Gross per 24 hour  Intake    840 ml  Output    950 ml  Net   -110 ml   Filed Weights   05/26/13 1545 05/27/13 0708 05/28/13 0515  Weight: 71.1 kg (156 lb 12 oz) 71 kg (156 lb 8.4 oz) 70.2 kg (154 lb 12.2 oz)     Exam:  General exam: Moderately built and nourished female sitting in bed and appears improved compared to yesterday. Respiratory system: clear to auscultation. No increased work of breathing. Cardiovascular system: S1 & S2 heard, RRR. No JVD, murmurs, gallops, clicks or pedal edema. Telemetry: Atrial fib with CVR/paced rhythm at times Gastrointestinal system: Abdomen is nondistended, soft and nontender. Normal bowel sounds heard. Central nervous system: Alert and oriented. No focal  neurological deficits. Extremities: Symmetric 5 x 5 power.   Data Reviewed: Basic Metabolic Panel:  Recent Labs Lab 05/26/13 1132 05/28/13 0550  NA 138 142  K 3.7 3.3*  CL 101 103  CO2  26 30  GLUCOSE 157* 91  BUN 7 9  CREATININE 0.62 0.74  CALCIUM 9.3 8.5   Liver Function Tests: No results found for this basename: AST, ALT, ALKPHOS, BILITOT, PROT, ALBUMIN,  in the last 168 hours No results found for this basename: LIPASE, AMYLASE,  in the last 168 hours No results found for this basename: AMMONIA,  in the last 168 hours CBC:  Recent Labs Lab 05/26/13 1132 05/28/13 0550  WBC 10.2 4.4  HGB 10.9* 10.0*  HCT 34.0* 33.5*  MCV 83.3 86.6  PLT 255 215   Cardiac Enzymes: No results found for this basename: CKTOTAL, CKMB, CKMBINDEX, TROPONINI,  in the last 168 hours BNP (last 3 results)  Recent Labs  05/26/13 1132  PROBNP 1371.0*   CBG:  Recent Labs Lab 05/27/13 1103 05/27/13 1604 05/27/13 2056 05/28/13 0633 05/28/13 1037  GLUCAP 104* 127* 85 89 104*    Recent Results (from the past 240 hour(s))  CULTURE, BLOOD (ROUTINE X 2)     Status: None   Collection Time    05/26/13  2:15 PM      Result Value Range Status   Specimen Description BLOOD LEFT HAND   Final   Special Requests BOTTLES DRAWN AEROBIC ONLY 10CCS   Final   Culture  Setup Time     Final   Value: 05/26/2013 20:55     Performed at [facility]   Culture     Final   Value:        BLOOD CULTURE RECEIVED NO GROWTH TO DATE CULTURE WILL BE HELD FOR 5 DAYS BEFORE ISSUING A FINAL NEGATIVE REPORT     Performed at [facility]   Report Status PENDING   Incomplete  CULTURE, BLOOD (ROUTINE X 2)     Status: None   Collection Time    05/26/13  3:10 PM      Result Value Range Status   Specimen Description BLOOD ARM LEFT   Final   Special Requests BOTTLES DRAWN AEROBIC ONLY 10CC   Final   Culture  Setup Time     Final   Value: 05/26/2013 20:55     Performed at [facility]   Culture     Final   Value:        BLOOD CULTURE RECEIVED NO GROWTH TO DATE CULTURE WILL BE HELD FOR 5 DAYS BEFORE ISSUING A FINAL NEGATIVE REPORT     Performed at [facility]   Report Status  PENDING   Incomplete  CULTURE, EXPECTORATED SPUTUM-ASSESSMENT     Status: None   Collection Time    05/27/13 12:06 PM      Result Value Range Status   Specimen Description SPUTUM   Final   Special Requests Normal   Final   Sputum evaluation     Final   Value: THIS SPECIMEN IS ACCEPTABLE. RESPIRATORY CULTURE REPORT TO FOLLOW.   Report Status 05/27/2013 FINAL   Final  CULTURE, RESPIRATORY (NON-EXPECTORATED)     Status: None   Collection Time    05/27/13 12:06 PM      Result Value Range Status   Specimen Description SPUTUM   Final   Special Requests NONE   Final   Gram Stain     Final  Value: MODERATE WBC PRESENT,BOTH PMN AND MONONUCLEAR     NO SQUAMOUS EPITHELIAL CELLS SEEN     NO ORGANISMS SEEN     Performed at [facility]   Culture     Final   Value: NO GROWTH 1 DAY     Performed at [facility]   Report Status PENDING   Incomplete       Studies: No results found.      Scheduled Meds: . azithromycin  500 mg Oral Q24H  . calcium-vitamin D  1 tablet Oral Daily  . cefTRIAXone (ROCEPHIN)  IV  1 g Intravenous Q24H  . cholecalciferol  1,000 Units Oral Daily  . colesevelam  1,250 mg Oral BID WC  . dabigatran  150 mg Oral BID  . dextromethorphan-guaiFENesin  1 tablet Oral BID  . [START ON 05/29/2013] diltiazem  180 mg Oral Daily  . furosemide  40 mg Oral Daily  . insulin aspart  0-15 Units Subcutaneous TID WC  . irbesartan  75 mg Oral Daily  . levalbuterol  0.63 mg Nebulization TID  . linagliptin  5 mg Oral Daily  . loratadine  10 mg Oral Daily  . metoprolol  50 mg Oral BID  . oseltamivir  30 mg Oral BID  . pantoprazole  40 mg Oral Daily  . propylthiouracil  100 mg Oral q morning - 10a   And  . propylthiouracil  50 mg Oral QPM   Continuous Infusions:   Principal Problem:   Influenza A Active Problems:   Atrial fibrillation   Bilateral leg edema   Diastolic CHF   Community acquired pneumonia   Community acquired bacterial pneumonia    Nausea, vomiting, and diarrhea    Time spent: 25 minutes    Verlaine Embry, MD, FACP, FHM. [facility] Pager [phone]  If 7PM-7AM, please contact night-coverage [email or web address] Password [email or web address] 05/28/2013, 2:45 PM    LOS: 2 days

## 2013-05-28 NOTE — Progress Notes (Signed)
PHARMACIST - PHYSICIAN COMMUNICATION DR: Waymon Amato CONCERNING:  Oseltamivir shortage  DESCRIPTION:  This patient has an order for Oseltamivir (Tamiflu) and has an Estimated Creatinine Clearance: 57.4 ml/min (by C-G formula based on Cr of 0.74)..  The package insert for Oseltamivir recommends 30mg  BID x 5 days for patients with CrCl 30-60 ml/min.  [facility] is experiencing a significant shortage of Oseltamivir 30mg  capsules.    To preserve an adequate supply of 30mg  capsules for our patients with more significant renal impairment the Infectious Diseases team has recommended to substitute Oseltamivir 75mg  qday x 5 days for patients with CrCl 30-60 ml/min.    RECOMMENDATION: Oseltamivir 75mg  PO qday to complete 5 days has been substituted for your patient. If you have any questions about this temporary substitution please feel free to call the Pharmacy at [phone] for assistance.  Estella Husk, Pharm.D., BCPS, AAHIVP Clinical Pharmacist Phone: [phone] or [phone] Pager: [phone] 05/28/2013, 3:03 PM

## 2013-05-28 NOTE — Progress Notes (Signed)
Pt oxygen sats on RA 95% at rest.  Oxygen sats on RA while ambulating 93%.  Pt remains on RA, will continue to monitor.

## 2013-05-28 NOTE — Progress Notes (Signed)
PCP: [name], MD Primary Cardiologist:  Dr [name]  Hailey [name] is a 67 y.o. female who presents today for routine electrophysiology followup.  Since her afib ablation, the patient reports doing reasonably well.  She has occasional palpitations and fatigue which she attributes to afib.  Today, she denies symptoms of chest pain, shortness of breath,  lower extremity edema, dizziness, presyncope, or syncope.  The patient is otherwise without complaint today.   Past Medical History  Diagnosis Date  . Hypertension   . Paroxysmal atrial fibrillation   . Diabetes mellitus   . Allergic rhinitis   . DJD (degenerative joint disease)   . Bradycardia     s/p PPM by Dr [name]  . CAD (coronary artery disease)     70% RCA stenosis, treated medically  . Polio     as a child  . Pacemaker   . Hyperthyroidism   . Pneumonia     12  . Goiter     thyroid  . Acute on chronic diastolic CHF (congestive heart failure), NYHA class 1 04/29/2013   Past Surgical History  Procedure Laterality Date  . Cholecystectomy    . Total abdominal hysterectomy    . Pacemaker insertion  12/03/10    [facility] [employer] DR RF implanted by Dr [name]  . Arm surgery d/t fx Right 96  . Tee without cardioversion N/A 07/30/2012    Procedure: TRANSESOPHAGEAL ECHOCARDIOGRAM (TEE);  Surgeon: [name], MD;  Location: [facility] ENDOSCOPY;  Service: Cardiovascular;  Laterality: N/A;  h&p in file-hope  . Atrial fibrillation ablation  07/31/2012    PVI by Dr [name]  . Lumbar laminectomy/decompression microdiscectomy Right 12/23/2012    Procedure: Right L5-S1 Laminotomy for resection of synovial cyst;  Surgeon: [name], MD;  Location: [facility] NEURO ORS;  Service: Neurosurgery;  Laterality: Right;  Right Lumbar five-sacral one Laminotomy for resection of synovial cyst    No current facility-administered medications for this visit.   No current outpatient prescriptions on file.   Facility-Administered Medications Ordered in Other  Visits  Medication Dose Route Frequency Provider Last Rate Last Dose  . acetaminophen (TYLENOL) tablet 650 mg  650 mg Oral Q6H PRN Elease Etienne, MD      . azithromycin (ZITHROMAX) tablet 500 mg  500 mg Oral Q24H Nishant Dhungel, MD   500 mg at 05/28/13 1039  . benzonatate (TESSALON) capsule 200 mg  200 mg Oral TID PRN Nishant Dhungel, MD   200 mg at 05/27/13 2012  . calcium-vitamin D (OSCAL WITH D) 500-200 MG-UNIT per tablet 1 tablet  1 tablet Oral Daily Nishant Dhungel, MD   1 tablet at 05/28/13 1036  . cefTRIAXone (ROCEPHIN) 1 g in dextrose 5 % 50 mL IVPB  1 g Intravenous Q24H Nishant Dhungel, MD   1 g at 05/28/13 1133  . cholecalciferol (VITAMIN D) tablet 1,000 Units  1,000 Units Oral Daily Nishant Dhungel, MD   1,000 Units at 05/28/13 1037  . colesevelam [facility]) tablet 1,250 mg  1,250 mg Oral BID WC Nishant Dhungel, MD   1,250 mg at 05/28/13 0639  . dabigatran (PRADAXA) capsule 150 mg  150 mg Oral BID Nishant Dhungel, MD   150 mg at 05/28/13 1038  . dextromethorphan-guaiFENesin (MUCINEX DM) 30-600 MG per 12 hr tablet 1 tablet  1 tablet Oral BID Nishant Dhungel, MD   1 tablet at 05/28/13 1036  . [START ON 05/29/2013] diltiazem (DILACOR XR) 24 hr capsule 180 mg  180 mg Oral Daily Maisie Fus A  Tresa Endo, MD      . furosemide (LASIX) tablet 40 mg  40 mg Oral Daily Nishant Dhungel, MD   40 mg at 05/28/13 1036  . HYDROcodone-acetaminophen (NORCO/VICODIN) 5-325 MG per tablet 1 tablet  1 tablet Oral Q6H PRN Elease Etienne, MD   1 tablet at 05/27/13 1308  . insulin aspart (novoLOG) injection 0-15 Units  0-15 Units Subcutaneous TID WC Nishant Dhungel, MD   2 Units at 05/27/13 1649  . irbesartan (AVAPRO) tablet 75 mg  75 mg Oral Daily Nishant Dhungel, MD   75 mg at 05/28/13 1035  . levalbuterol (XOPENEX) nebulizer solution 0.63 mg  0.63 mg Nebulization TID Elease Etienne, MD   0.63 mg at 05/28/13 0829  . levalbuterol (XOPENEX) nebulizer solution 0.63 mg  0.63 mg Nebulization Q6H PRN Elease Etienne, MD       . linagliptin (TRADJENTA) tablet 5 mg  5 mg Oral Daily Nishant Dhungel, MD   5 mg at 05/28/13 1039  . loperamide (IMODIUM) capsule 2 mg  2 mg Oral TID PRN Leda Gauze, NP   2 mg at 05/27/13 0410  . loratadine (CLARITIN) tablet 10 mg  10 mg Oral Daily Nishant Dhungel, MD   10 mg at 05/28/13 1036  . metoprolol (LOPRESSOR) tablet 50 mg  50 mg Oral BID Nishant Dhungel, MD   50 mg at 05/28/13 1036  . ondansetron (ZOFRAN) injection 4 mg  4 mg Intravenous Q6H PRN Nishant Dhungel, MD   4 mg at 05/27/13 1157  . oseltamivir (TAMIFLU) capsule 30 mg  30 mg Oral BID Nishant Dhungel, MD   30 mg at 05/28/13 1038  . pantoprazole (PROTONIX) EC tablet 40 mg  40 mg Oral Daily Nishant Dhungel, MD   40 mg at 05/28/13 1038  . promethazine (PHENERGAN) injection 12.5 mg  12.5 mg Intravenous Q6H PRN Leda Gauze, NP   12.5 mg at 05/27/13 0301  . propylthiouracil (PTU) tablet 100 mg  100 mg Oral q morning - 10a Nishant Dhungel, MD   100 mg at 05/28/13 1037   And  . propylthiouracil (PTU) tablet 50 mg  50 mg Oral QPM Nishant Dhungel, MD   50 mg at 05/27/13 1650    Physical Exam: Filed Vitals:   05/21/13 1228  BP: 142/71  Pulse: 69  Weight: 158 lb (71.668 kg)    GEN- The patient is well appearing, alert and oriented x 3 today.   Head- normocephalic, atraumatic Eyes-  Sclera clear, conjunctiva pink Ears- hearing intact Oropharynx- clear Lungs- Clear to ausculation bilaterally, normal work of breathing Heart- Regular rate and rhythm, no murmurs, rubs or gallops, PMI not laterally displaced GI- soft, NT, ND, + BS Extremities- no clubbing, cyanosis, or edema  Assessment and Plan:  1. afib afib burden by PPM interrogation today is 20%.  This represents an increase since her last visit.  Though she remains clinically improved, she does have some symptoms with her afib. Therapeutic strategies for afib including medicine and ablation were discussed in detail with the patient today. Risk,  benefits, and alternatives to repeat EP study and radiofrequency ablation for afib were also discussed in detail today. These risks include but are not limited to stroke, bleeding, vascular damage, tamponade, perforation, damage to the esophagus, lungs, and other structures, pulmonary vein stenosis, worsening renal function, and death. The patient understands these risk and wishes to contemplate ablation vs additional medical therapy.  Tikosyn would likely be an option if she decides to avoid ablation.  This can be arranged electively.  If she decides to proceed with ablation then I would like for her to have a cardiac CT prior to the procedure to evaluate her pulmonary veins prior to a second procedure.  This would be helpful in evaluating her shortness of breath.  2. Tachy/brady Normal pacemaker function See Pace Art report No changes today   Return in 2 months

## 2013-05-29 DIAGNOSIS — I4891 Unspecified atrial fibrillation: Secondary | ICD-10-CM | POA: Diagnosis not present

## 2013-05-29 DIAGNOSIS — J111 Influenza due to unidentified influenza virus with other respiratory manifestations: Secondary | ICD-10-CM | POA: Diagnosis not present

## 2013-05-29 DIAGNOSIS — R0902 Hypoxemia: Secondary | ICD-10-CM | POA: Diagnosis not present

## 2013-05-29 DIAGNOSIS — J189 Pneumonia, unspecified organism: Secondary | ICD-10-CM | POA: Diagnosis not present

## 2013-05-29 LAB — CBC
HCT: 34.7 % — ABNORMAL LOW (ref 36.0–46.0)
Hemoglobin: 11 g/dL — ABNORMAL LOW (ref 12.0–15.0)
MCH: 26.9 pg (ref 26.0–34.0)
MCHC: 31.7 g/dL (ref 30.0–36.0)
MCV: 84.8 fL (ref 78.0–100.0)
Platelets: 224 10*3/uL (ref 150–400)
WBC: 3.7 10*3/uL — ABNORMAL LOW (ref 4.0–10.5)

## 2013-05-29 LAB — BASIC METABOLIC PANEL
BUN: 10 mg/dL (ref 6–23)
CO2: 28 mEq/L (ref 19–32)
Calcium: 8.6 mg/dL (ref 8.4–10.5)
Chloride: 102 mEq/L (ref 96–112)
Creatinine, Ser: 0.76 mg/dL (ref 0.50–1.10)
GFR calc Af Amer: >90 mL/min (ref >90)
Glucose, Bld: 87 mg/dL (ref 70–99)
Potassium: 3.5 mEq/L (ref 3.5–5.1)

## 2013-05-29 LAB — GLUCOSE, CAPILLARY: Glucose-Capillary: 102 mg/dL — ABNORMAL HIGH (ref 70–99)

## 2013-05-29 MED ORDER — DILTIAZEM HCL ER 180 MG PO CP24: 180.0000 mg | ORAL_CAPSULE | Freq: Every day | ORAL | Status: DC

## 2013-05-29 MED ORDER — OSELTAMIVIR PHOSPHATE 75 MG PO CAPS: 75.0000 mg | ORAL_CAPSULE | Freq: Every day | ORAL | Status: DC

## 2013-05-29 MED ORDER — LEVOFLOXACIN 750 MG PO TABS: 750.0000 mg | ORAL_TABLET | Freq: Every day | ORAL | Status: DC

## 2013-05-29 NOTE — Progress Notes (Signed)
Subjective:  Breathing better; chest wall discomfort improving.  Objective:   Vital Signs in the last 24 hours: Temp:  [98 F (36.7 C)-98.2 F (36.8 C)] 98 F (36.7 C) (12/27 0419) Pulse Rate:  [80-89] 87 (12/27 0419) Resp:  [18-20] 20 (12/27 0419) BP: (123-133)/(55-65) 131/65 mmHg (12/27 0419) SpO2:  [93 %-100 %] 97 % (12/27 0813) Weight:  [153 lb 10.6 oz (69.7 kg)] 153 lb 10.6 oz (69.7 kg) (12/27 0419)  Intake/Output from previous day: 12/26 0701 - 12/27 0700 In: 1200 [P.O.:1200] Out: 800 [Urine:800]  Medications: . azithromycin  500 mg Oral Q24H  . calcium-vitamin D  1 tablet Oral Daily  . cefTRIAXone (ROCEPHIN)  IV  1 g Intravenous Q24H  . cholecalciferol  1,000 Units Oral Daily  . colesevelam  1,250 mg Oral BID WC  . dabigatran  150 mg Oral BID  . dextromethorphan-guaiFENesin  1 tablet Oral BID  . diltiazem  180 mg Oral Daily  . furosemide  40 mg Oral Daily  . insulin aspart  0-15 Units Subcutaneous TID WC  . irbesartan  75 mg Oral Daily  . levalbuterol  0.63 mg Nebulization TID  . linagliptin  5 mg Oral Daily  . loratadine  10 mg Oral Daily  . metoprolol  50 mg Oral BID  . oseltamivir  75 mg Oral Daily  . pantoprazole  40 mg Oral Daily  . propylthiouracil  100 mg Oral q morning - 10a   And  . propylthiouracil  50 mg Oral QPM       Physical Exam:   General appearance: alert, cooperative and no distress Neck: no carotid bruit, no JVD, supple, symmetrical, trachea midline and thyroid not enlarged, symmetric, no tenderness/mass/nodules Lungs: clear to auscultation bilaterally Heart: regular rate and rhythm Abdomen: soft, non-tender; bowel sounds normal; no masses,  no organomegaly Extremities: no edema, redness or tenderness in the calves or thighs Pulses: 2+ and symmetric Skin: Skin color, texture, turgor normal. No rashes or lesions Neurologic: Grossly normal   Rate: 68  Rhythm: normal sinus rhythm  Lab Results:    Recent Labs  05/28/13 0550  05/29/13 0425  NA 142 140  K 3.3* 3.5  CL 103 102  CO2 30 28  GLUCOSE 91 87  BUN 9 10  CREATININE 0.74 0.76   No results found for this basename: TROPONINI, CK, MB,  in the last 72 hours Hepatic Function Panel No results found for this basename: PROT, ALBUMIN, AST, ALT, ALKPHOS, BILITOT, BILIDIR, IBILI,  in the last 72 hours No results found for this basename: INR,  in the last 72 hours BNP (last 3 results)  Recent Labs  05/26/13 1132  PROBNP 1371.0*    Lipid Panel     Component Value Date/Time   CHOL 94 11/24/2010 0837   TRIG 51 11/24/2010 0837   HDL 53 11/24/2010 0837   CHOLHDL 1.8 11/24/2010 0837   VLDL 10 11/24/2010 0837   LDLCALC 31 11/24/2010 0837      Imaging:  No results found.    Assessment/Plan:   Principal Problem:   Influenza A Active Problems:   Atrial fibrillation   Bilateral leg edema   Diastolic CHF   Community acquired pneumonia   Community acquired bacterial pneumonia   Nausea, vomiting, and diarrhea   Converted to NSR on increased diltiazem dose; BP stable.  Chest wall pain is significantly improved from yesterday. No wheezing. Now in sinus, DC when primary care feels appropriate. If maintains sinus may not need  tikosyn or repeat ablation.   Lennette Bihari, MD, [facility] 05/29/2013, 9:38 AM

## 2013-05-29 NOTE — Discharge Summary (Signed)
Physician Discharge Summary  Hailey [name] ZOX:[id number] DOB: [birth year] DOA: 05/26/2013  PCP: [name], MD  Admit date: 05/26/2013 Discharge date: 05/29/2013  Time spent: Less than 30 minutes  Recommendations for Outpatient Follow-up:  1. Dr. [name], PCP in one week with repeat labs (CBC and BMP) 2. Dr. [name], Cardiology 3. Dr. Hillis Range, [facility] Cardiology 4. Recommend repeating chest x-ray in 4-6 weeks to ensure resolution of pneumonia findings. 5. During PCP visit, followup outstanding blood culture and sputum culture results that were sent in the hospital.  Discharge Diagnoses:  Principal Problem:   Influenza A Active Problems:   Atrial fibrillation   Bilateral leg edema   Diastolic CHF   Community acquired pneumonia   Community acquired bacterial pneumonia   Nausea, vomiting, and diarrhea   Discharge Condition: Improved & Stable  Diet recommendation: Heart healthy and diabetic diet.  Filed Weights   05/27/13 0708 05/28/13 0515 05/29/13 0419  Weight: 71 kg (156 lb 8.4 oz) 70.2 kg (154 lb 12.2 oz) 69.7 kg (153 lb 10.6 oz)    History of present illness:  67 year old female with history of diastolic dysfunction, A. fib on anticoagulation, status post cardioversion, PPM, DM 2, hypothyroidism presented to the ED on 12/24 with 4 days history of feeling unwell-fevers, chills, nausea, nasal congestion, nonproductive cough, chest pain with coughing and wheezing. A day prior to admission, she had multiple episodes of non-bloody emesis-possibly posttussive. No sickly contacts. In the ED, chest x-ray showed left-sided atelectasis versus infiltrate. She was given a dose of IV Rocephin and azithromycin and EDP was planning to discharge her, patient however became hypoxic in the 80s with activity and was admitted for further management.  Hospital Course:   Influenza A  - confirmed by PCR. Constitutional symptoms most likely secondary to same.  - Continue Tamiflu and  complete five-day course. Improved with supportive care.   Possible community acquired versus acute viral bronchitis/acute hypoxic respiratory failure  - Treated with IV Rocephin and azithromycin  - Atypical chest pain related to dry heaving and coughing has resolved. EKG 12/25 without acute changes.  - HIV antibody nonreactive. Urine Legionella and streptococcal antigen negative.  - Blood cultures negative to date.  - Clinically improved. Hypoxia resolved. Will change to oral Levaquin to complete total 7 days treatment.  Nausea, vomiting and diarrhea  - Likely related to influenza versus acute GE. Resolved since 12/25    Atrial fibrillation, status post cardioversion and ablation  - Continue home medications. Cardiology consultation appreciated. Per notes, substantially increase in the prevalence of A. fib  - Patient is contemplating her options of continued medical treatment versus repeat ablation  - Cardiology increased Cardizem from 120>180 mg. Continue beta blockers and Pradaxa.  - As per cardiology followup today, patient has converted to normal sinus rhythm an increased Cardizem dose, BP stable. They suggest that if she maintains sinus rhythm she may not need Tikosyn or repeat ablation. - Outpatient followup with cardiology  Chronic diastolic CHF  - Seems compensated at this time. Continue home medications.   Hypertension  - Controlled   Type II DM  - Continue home medications.   Hyperthyroidism   Anemia  -Stable.   Hypokalemia  - Repleted   Consultations:  Cardiology  Procedures:  None    Discharge Exam:  Complaints:  Patient states that she feels much better. Denies dyspnea or chest pain. Minimal dry cough. No nausea, vomiting or diarrhea. Eager to go home.  Filed Vitals:   05/28/13  2021 05/29/13 0419 05/29/13 0813 05/29/13 1100  BP: 133/61 131/65  126/58  Pulse: 89 87  66  Temp:  98 F (36.7 C)  98 F (36.7 C)  TempSrc:  Oral  Oral  Resp: 20 20  20    Height:      Weight:  69.7 kg (153 lb 10.6 oz)    SpO2: 100% 95% 97% 94%    General exam: Pleasant and comfortable female sitting in bed. Respiratory system: clear to auscultation. No increased work of breathing.  Cardiovascular system: S1 & S2 heard, RRR. No JVD, murmurs, gallops, clicks or pedal edema.  Gastrointestinal system: Abdomen is nondistended, soft and nontender. Normal bowel sounds heard.  Central nervous system: Alert and oriented. No focal neurological deficits.  Extremities: Symmetric 5 x 5 power.   Discharge Instructions      Discharge Orders   Future Appointments Provider Department Dept Phone   07/23/2013 3:00 PM Hillis Range, MD [facility] [phone]   Future Orders Complete By Expires   (HEART FAILURE PATIENTS) Call MD:  Anytime you have any of the following symptoms: 1) 3 pound weight gain in 24 hours or 5 pounds in 1 week 2) shortness of breath, with or without a dry hacking cough 3) swelling in the hands, feet or stomach 4) if you have to sleep on extra pillows at night in order to breathe.  As directed    Call MD for:  difficulty breathing, headache or visual disturbances  As directed    Call MD for:  extreme fatigue  As directed    Call MD for:  persistant dizziness or light-headedness  As directed    Call MD for:  persistant nausea and vomiting  As directed    Call MD for:  severe uncontrolled pain  As directed    Call MD for:  temperature >100.4  As directed    Diet - low sodium heart healthy  As directed    Diet Carb Modified  As directed    Increase activity slowly  As directed        Medication List         acetaminophen 500 MG tablet  Commonly known as:  TYLENOL  Take 1,000 mg by mouth every 6 (six) hours as needed for pain.     ALLERGY RELIEF 10 MG tablet  Generic drug:  loratadine  Take 10 mg by mouth daily.     benzonatate 200 MG capsule  Commonly known as:  TESSALON  Take 200 mg by mouth 3 (three) times daily as  needed for cough.     CALCIUM 600+D 600-400 MG-UNIT per tablet  Generic drug:  Calcium Carbonate-Vitamin D  Take 1 tablet by mouth daily.     dabigatran 150 MG Caps capsule  Commonly known as:  PRADAXA  Take 150 mg by mouth 2 (two) times daily.     diltiazem 180 MG 24 hr capsule  Commonly known as:  DILACOR XR  Take 1 capsule (180 mg total) by mouth daily.     furosemide 40 MG tablet  Commonly known as:  LASIX  Take 1 tablet (40 mg total) by mouth daily.     IMODIUM A-D 2 MG tablet  Generic drug:  loperamide  Take 2 mg by mouth daily as needed for diarrhea or loose stools.     levofloxacin 750 MG tablet  Commonly known as:  LEVAQUIN  Take 1 tablet (750 mg total) by mouth daily.  metoprolol 50 MG tablet  Commonly known as:  LOPRESSOR  Take 50 mg by mouth 2 (two) times daily.     oseltamivir 75 MG capsule  Commonly known as:  TAMIFLU  Take 1 capsule (75 mg total) by mouth daily.     pantoprazole 40 MG tablet  Commonly known as:  PROTONIX  Take 40 mg by mouth daily.     propylthiouracil 50 MG tablet  Commonly known as:  PTU  Take 50-100 mg by mouth 2 (two) times daily. Take 2 tabs in the AM and 1 tab in the PM     saxagliptin HCl 5 MG Tabs tablet  Commonly known as:  ONGLYZA  Take 5 mg by mouth daily.     valsartan 80 MG tablet  Commonly known as:  DIOVAN  Take 80 mg by mouth daily.     Vitamin D3 1000 UNITS Caps  Take 1 capsule by mouth daily.     WELCHOL 625 MG tablet  Generic drug:  colesevelam  Take 1,250 mg by mouth 2 (two) times daily with a meal.       Follow-up Information   Follow up with [name], MD. Schedule an appointment as soon as possible for a visit in 1 week. (To be seen with repeat labs (CBC & BMP).)    Specialty:  Internal Medicine   Contact information:   [address] [place] [address] [phone]       Schedule an appointment as soon as possible for a visit with [name], MD.   Specialty:   Cardiology   Contact information:   [address] [place] [address] [phone]       Schedule an appointment as soon as possible for a visit with Hillis Range, MD.   Specialty:  Cardiology   Contact information:   [address] [place] [address] [phone]        The results of significant diagnostics from this hospitalization (including imaging, microbiology, ancillary and laboratory) are listed below for reference.    Significant Diagnostic Studies: Dg Chest Port 1 View  05/26/2013   CLINICAL DATA:  Nausea, vomiting  EXAM: PORTABLE CHEST - 1 VIEW  COMPARISON:  03/17/2013  FINDINGS: Cardiomegaly again noted. Dual lead cardiac pacemaker is unchanged in position. No pulmonary edema. Streaky left base atelectasis or infiltrate.  IMPRESSION: No pulmonary edema. Stable dual lead cardiac pacemaker position. Streaky left basilar atelectasis or infiltrate.   Electronically Signed   By: Natasha Mead M.D.   On: 05/26/2013 11:32    Microbiology: Recent Results (from the past 240 hour(s))  CULTURE, BLOOD (ROUTINE X 2)     Status: None   Collection Time    05/26/13  2:15 PM      Result Value Range Status   Specimen Description BLOOD LEFT HAND   Final   Special Requests BOTTLES DRAWN AEROBIC ONLY 10CCS   Final   Culture  Setup Time     Final   Value: 05/26/2013 20:55     Performed at [facility]   Culture     Final   Value:        BLOOD CULTURE RECEIVED NO GROWTH TO DATE CULTURE WILL BE HELD FOR 5 DAYS BEFORE ISSUING A FINAL NEGATIVE REPORT     Performed at [facility]   Report Status PENDING   Incomplete  CULTURE, BLOOD (ROUTINE X 2)     Status: None   Collection  Time    05/26/13  3:10 PM      Result Value Range Status   Specimen Description BLOOD ARM LEFT   Final   Special Requests BOTTLES DRAWN AEROBIC ONLY 10CC   Final   Culture  Setup Time     Final   Value: 05/26/2013 20:55     Performed at [facility]   Culture      Final   Value:        BLOOD CULTURE RECEIVED NO GROWTH TO DATE CULTURE WILL BE HELD FOR 5 DAYS BEFORE ISSUING A FINAL NEGATIVE REPORT     Performed at [facility]   Report Status PENDING   Incomplete  CULTURE, EXPECTORATED SPUTUM-ASSESSMENT     Status: None   Collection Time    05/27/13 12:06 PM      Result Value Range Status   Specimen Description SPUTUM   Final   Special Requests Normal   Final   Sputum evaluation     Final   Value: THIS SPECIMEN IS ACCEPTABLE. RESPIRATORY CULTURE REPORT TO FOLLOW.   Report Status 05/27/2013 FINAL   Final  CULTURE, RESPIRATORY (NON-EXPECTORATED)     Status: None   Collection Time    05/27/13 12:06 PM      Result Value Range Status   Specimen Description SPUTUM   Final   Special Requests NONE   Final   Gram Stain     Final   Value: MODERATE WBC PRESENT,BOTH PMN AND MONONUCLEAR     NO SQUAMOUS EPITHELIAL CELLS SEEN     NO ORGANISMS SEEN     Performed at [facility]   Culture     Final   Value: NO GROWTH 1 DAY     Performed at [facility]   Report Status PENDING   Incomplete     Labs: Basic Metabolic Panel:  Recent Labs Lab 05/26/13 1132 05/28/13 0550 05/29/13 0425  NA 138 142 140  K 3.7 3.3* 3.5  CL 101 103 102  CO2 26 30 28   GLUCOSE 157* 91 87  BUN 7 9 10   CREATININE 0.62 0.74 0.76  CALCIUM 9.3 8.5 8.6  MG  --   --  2.0   Liver Function Tests: No results found for this basename: AST, ALT, ALKPHOS, BILITOT, PROT, ALBUMIN,  in the last 168 hours No results found for this basename: LIPASE, AMYLASE,  in the last 168 hours No results found for this basename: AMMONIA,  in the last 168 hours CBC:  Recent Labs Lab 05/26/13 1132 05/28/13 0550 05/29/13 0425  WBC 10.2 4.4 3.7*  HGB 10.9* 10.0* 11.0*  HCT 34.0* 33.5* 34.7*  MCV 83.3 86.6 84.8  PLT 255 215 224   Cardiac Enzymes: No results found for this basename: CKTOTAL, CKMB, CKMBINDEX, TROPONINI,  in the last 168 hours BNP: BNP (last 3  results)  Recent Labs  05/26/13 1132  PROBNP 1371.0*   CBG:  Recent Labs Lab 05/28/13 1037 05/28/13 1632 05/28/13 2118 05/29/13 0617 05/29/13 1047  GLUCAP 104* 130* 88 86 102*    Signed:  HONGALGI,ANAND, MD, FACP, FHM. [facility] Pager [phone]  If 7PM-7AM, please contact night-coverage [email or web address] Password [facility] 05/29/2013, 12:11 PM

## 2013-05-29 NOTE — Progress Notes (Signed)
D/C Tele, D/C IV, Reviewed Pt.'s D/C instructions and D/C paperwork given, Pt. And Husband verbalized understanding. Pt. Denied any signs and symptoms of Distress or discomfort. Pt. Left Unit via Wheelchair and transported Home via her husband.

## 2013-05-30 LAB — CULTURE, RESPIRATORY W GRAM STAIN: Culture: NORMAL

## 2013-05-30 LAB — CULTURE, RESPIRATORY

## 2013-06-01 LAB — CULTURE, BLOOD (ROUTINE X 2)

## 2013-06-07 ENCOUNTER — Telehealth: Payer: Self-pay | Admitting: *Deleted

## 2013-06-07 MED ORDER — DILTIAZEM HCL ER 180 MG PO CP24: 180.0000 mg | ORAL_CAPSULE | Freq: Every day | ORAL | Status: DC

## 2013-06-07 NOTE — Telephone Encounter (Signed)
Pt was calling in regards to a medication change. Pt was in the hospital back in December and she was calling bc she did not feel that she needed a fu appointment, but wanted to know.

## 2013-06-07 NOTE — Telephone Encounter (Signed)
Discussed pt w/ Tobin Chad, CMA w/ devices r/t Dr. Sallyanne Kuster not being available.  Advised pt sees Dr. Rayann Heman for Afib and appt on 2.20.15 should be fine.  Stated pt shouldn't need to see Dr. Sallyanne Kuster before that appt.  Returned call and pt verified x 2.  Pt informed message received and advise per LDJTTSV.  Pt verbalized understanding and agreed w/ plan.  RN also informed pt refill for diltiazem will be sent to pharmacy as she will need a refill before her appt w/ Dr. Rayann Heman.  Pt verbalized understanding and agreed w/ plan.  Refill(s) sent to pharmacy.

## 2013-06-08 ENCOUNTER — Encounter: Payer: Self-pay | Admitting: Cardiovascular Disease

## 2013-06-15 DIAGNOSIS — J189 Pneumonia, unspecified organism: Secondary | ICD-10-CM | POA: Diagnosis not present

## 2013-06-23 ENCOUNTER — Telehealth: Payer: Self-pay | Admitting: *Deleted

## 2013-06-23 NOTE — Telephone Encounter (Signed)
Pt called stating that she needs her Diltiazem 180mg  24hr refilled. She stated that she spoke to a nurse about this a while ago and it was never taken care of. It was prescribed to her when she was in the hospital and they told her that Dr. Loletha Grayer would have to prescribe it to her in order to get it refilled. I told her to call her pharmacy to have them send [place] a refill request.

## 2013-06-24 NOTE — Telephone Encounter (Signed)
Returned call and pt verified x 2.  Pt informed message received and this RN sent Rx to [facility] in [place] on 1.5.15 as requested.  Pt stated she called back and told the operator that the pharmacy had the prescription and was filling it for her.  Pt informed message was not received, but glad she was able to get Rx.  Pt verbalized understanding.

## 2013-06-30 ENCOUNTER — Other Ambulatory Visit: Payer: Self-pay

## 2013-06-30 DIAGNOSIS — D485 Neoplasm of uncertain behavior of skin: Secondary | ICD-10-CM | POA: Diagnosis not present

## 2013-06-30 DIAGNOSIS — L905 Scar conditions and fibrosis of skin: Secondary | ICD-10-CM | POA: Diagnosis not present

## 2013-07-07 DIAGNOSIS — I509 Heart failure, unspecified: Secondary | ICD-10-CM | POA: Diagnosis not present

## 2013-07-07 DIAGNOSIS — R3915 Urgency of urination: Secondary | ICD-10-CM | POA: Diagnosis not present

## 2013-07-07 DIAGNOSIS — J189 Pneumonia, unspecified organism: Secondary | ICD-10-CM | POA: Diagnosis not present

## 2013-07-23 ENCOUNTER — Encounter: Payer: Self-pay | Admitting: *Deleted

## 2013-07-23 ENCOUNTER — Encounter: Payer: Self-pay | Admitting: Internal Medicine

## 2013-07-23 ENCOUNTER — Ambulatory Visit (INDEPENDENT_AMBULATORY_CARE_PROVIDER_SITE_OTHER): Payer: Medicare Other | Admitting: Internal Medicine

## 2013-07-23 VITALS — BP 148/82 | HR 78 | Ht 59.0 in | Wt 150.5 lb

## 2013-07-23 DIAGNOSIS — R911 Solitary pulmonary nodule: Secondary | ICD-10-CM

## 2013-07-23 DIAGNOSIS — R001 Bradycardia, unspecified: Secondary | ICD-10-CM

## 2013-07-23 DIAGNOSIS — I498 Other specified cardiac arrhythmias: Secondary | ICD-10-CM | POA: Diagnosis not present

## 2013-07-23 DIAGNOSIS — I4891 Unspecified atrial fibrillation: Secondary | ICD-10-CM | POA: Diagnosis not present

## 2013-07-23 LAB — MDC_IDC_ENUM_SESS_TYPE_INCLINIC
Battery Remaining Longevity: 78 mo
Implantable Pulse Generator Model: 2210
Implantable Pulse Generator Serial Number: 7255453
Lead Channel Pacing Threshold Amplitude: 0.75 V
Lead Channel Pacing Threshold Amplitude: 0.875 V
Lead Channel Pacing Threshold Pulse Width: 0.4 ms
Lead Channel Sensing Intrinsic Amplitude: 2.9 mV
Lead Channel Sensing Intrinsic Amplitude: 7.2 mV
Lead Channel Setting Pacing Amplitude: 1 V
Lead Channel Setting Pacing Pulse Width: 0.4 ms
Lead Channel Setting Sensing Sensitivity: 2 mV
MDC IDC MSMT BATTERY VOLTAGE: 2.93 V
MDC IDC MSMT LEADCHNL RA IMPEDANCE VALUE: 375 Ohm
MDC IDC MSMT LEADCHNL RV IMPEDANCE VALUE: 412.5 Ohm
MDC IDC MSMT LEADCHNL RV PACING THRESHOLD PULSEWIDTH: 0.4 ms
MDC IDC SESS DTM: 20150220161211
MDC IDC SET LEADCHNL RA PACING AMPLITUDE: 1.75 V
MDC IDC STAT BRADY RA PERCENT PACED: 77 %
MDC IDC STAT BRADY RV PERCENT PACED: 21 %

## 2013-07-23 NOTE — Patient Instructions (Signed)
Your physician has recommended that you have an ablation. Catheter ablation is a medical procedure used to treat some cardiac arrhythmias (irregular heartbeats). During catheter ablation, a long, thin, flexible tube is put into a blood vessel in your groin (upper thigh), or neck. This tube is called an ablation catheter. It is then guided to your heart through the blood vessel. Radio frequency waves destroy small areas of heart tissue where abnormal heartbeats may cause an arrhythmia to start. Please see the instruction sheet given to you today.    See instruction sheet  

## 2013-07-25 NOTE — Progress Notes (Signed)
PCP: Jani Gravel, MD Primary Cardiologist:  Dr Sallyanne Kuster  Hailey [name] is a 68 y.o. female who presents today for routine electrophysiology followup.  Since her afib ablation, the patient reports doing reasonably well.  She has occasional palpitations and fatigue which she attributes to afib. The episodes continue to be worrisome.  Today, she denies symptoms of chest pain, shortness of breath,  lower extremity edema, dizziness, presyncope, or syncope.  The patient is otherwise without complaint today.   Past Medical History  Diagnosis Date  . Hypertension   . Paroxysmal atrial fibrillation   . Diabetes mellitus   . Allergic rhinitis   . DJD (degenerative joint disease)   . Bradycardia     s/p PPM by Dr Sallyanne Kuster  . CAD (coronary artery disease)     70% RCA stenosis, treated medically  . Polio     as a child  . Pacemaker   . Hyperthyroidism   . Pneumonia     12  . Goiter     thyroid  . Acute on chronic diastolic CHF (congestive heart failure), NYHA class 1 04/29/2013   Past Surgical History  Procedure Laterality Date  . Cholecystectomy    . Total abdominal hysterectomy    . Pacemaker insertion  12/03/10    SJM Accent DR RF implanted by Dr Sallyanne Kuster  . Arm surgery d/t fx Right 96  . Tee without cardioversion N/A 07/30/2012    Procedure: TRANSESOPHAGEAL ECHOCARDIOGRAM (TEE);  Surgeon: Sanda Klein, MD;  Location: [facility] END[facility]COPY;  Service: Cardiovascular;  Laterality: N/A;  h&p in file-[facility]  . Atrial fibrillation ablation  07/31/2012    PVI by Dr Rayann Heman  . Lumbar laminectomy/decompression microdiscectomy Right 12/23/2012    Procedure: Right L5-S1 Laminotomy for resection of synovial cyst;  Surgeon: Hosie Spangle, MD;  Location: [place] NEURO ORS;  Service: Neurosurgery;  Laterality: Right;  Right Lumbar five-sacral one Laminotomy for resection of synovial cyst    Current Outpatient Prescriptions  Medication Sig Dispense Refill  . acetaminophen (TYLENOL) 500 MG tablet Take 1,000 mg by  mouth every 6 (six) hours as needed for pain.      . Calcium Carbonate-Vitamin D (CALCIUM 600+D) 600-400 MG-UNIT per tablet Take 1 tablet by mouth daily.       . Cholecalciferol (VITAMIN D3) 1000 UNITS CAPS Take 1 capsule by mouth daily.        . colesevelam (WELCHOL) 625 MG tablet Take 1,250 mg by mouth 2 (two) times daily with a meal.       . dabigatran (PRADAXA) 150 MG CAPS capsule Take 150 mg by mouth 2 (two) times daily.      Marland Kitchen diltiazem (DILACOR XR) 180 MG 24 hr capsule Take 1 capsule (180 mg total) by mouth daily.  30 capsule  1  . furosemide (LASIX) 40 MG tablet Take 1 tablet (40 mg total) by mouth daily.  30 tablet  5  . loperamide (IMODIUM A-D) 2 MG tablet Take 2 mg by mouth daily as needed for diarrhea or loose stools.      Marland Kitchen loratadine (ALLERGY RELIEF) 10 MG tablet Take 10 mg by mouth daily.      . metoprolol (LOPRESSOR) 50 MG tablet Take 50 mg by mouth 2 (two) times daily.      Marland Kitchen oxybutynin (DITROPAN) 5 MG tablet Take 5 mg by mouth daily.      . pantoprazole (PROTONIX) 40 MG tablet Take 40 mg by mouth daily.       Marland Kitchen propylthiouracil (  PTU) 50 MG tablet Take 2 tabs in the AM and 1 tab in the PM      . saxagliptin HCl (ONGLYZA) 5 MG TABS tablet Take 5 mg by mouth daily.      . valsartan (DIOVAN) 80 MG tablet Take 80 mg by mouth daily.       No current facility-administered medications for this visit.    Physical Exam: Filed Vitals:   07/23/13 1539  BP: 148/82  Pulse: 78  Height: 4' 11" (1.499 m)  Weight: 150 lb 8 oz (68.266 kg)    GEN- The patient is well appearing, alert and oriented x 3 today.   Head- normocephalic, atraumatic Eyes-  Sclera clear, conjunctiva pink Ears- hearing intact Oropharynx- clear Lungs- Clear to ausculation bilaterally, normal work of breathing Heart- Regular rate and rhythm, no murmurs, rubs or gallops, PMI not laterally displaced GI- soft, NT, ND, + BS Extremities- no clubbing, cyanosis, or edema  Assessment and Plan:  1. afib afib burden  by PPM interrogation today is 17%.  This is not really improved since her last visit. She continues to have some symptoms with her afib.  At times her arrhythmia appears to be more regular and may also represent atrial flutter. Therapeutic strategies for afib and atrial futter including medicine and repeat ablation were discussed in detail with the patient today. Risk, benefits, and alternatives to repeat EP study and radiofrequency ablation for afib were also discussed in detail today. These risks include but are not limited to stroke, bleeding, vascular damage, tamponade, perforation, damage to the esophagus, lungs, and other structures, pulmonary vein stenosis, worsening renal function, and death. The patient understands these risk and wishes to proceed.   As she continues to have shortness of breath, I will order a cardiac CT to assess for pulmonary vein stenosis prior to her repeat procedure.  2. Tachy/brady Normal pacemaker function See Pace Art report No changes today   

## 2013-07-26 ENCOUNTER — Telehealth: Payer: Self-pay | Admitting: Internal Medicine

## 2013-07-26 ENCOUNTER — Other Ambulatory Visit: Payer: Self-pay | Admitting: *Deleted

## 2013-07-26 DIAGNOSIS — I4891 Unspecified atrial fibrillation: Secondary | ICD-10-CM

## 2013-07-26 NOTE — Telephone Encounter (Signed)
New problem   Pt has questions concerning her ablation. Please call pt

## 2013-07-30 ENCOUNTER — Encounter (HOSPITAL_COMMUNITY): Payer: Self-pay | Admitting: Pharmacy Technician

## 2013-07-30 NOTE — Telephone Encounter (Signed)
Follow up   Pt would like a call back to confirm some information with her procedures.   Please give her a call.

## 2013-08-02 ENCOUNTER — Encounter: Payer: Self-pay | Admitting: Internal Medicine

## 2013-08-02 ENCOUNTER — Other Ambulatory Visit: Payer: Self-pay | Admitting: *Deleted

## 2013-08-02 DIAGNOSIS — I4891 Unspecified atrial fibrillation: Secondary | ICD-10-CM

## 2013-08-02 NOTE — Telephone Encounter (Signed)
Spoke with patient and let her know to come on in for the labs tomorrow and she should be hearing from [place] in regards to her CT prior.  She will ask tomorrow if she still has not gotten a call from anyone

## 2013-08-02 NOTE — Telephone Encounter (Signed)
New problem   Pt need to know if she need to have blood work tomorrow for her sx concerning her ablation. Please call pt.

## 2013-08-03 ENCOUNTER — Other Ambulatory Visit (INDEPENDENT_AMBULATORY_CARE_PROVIDER_SITE_OTHER): Payer: Medicare Other

## 2013-08-03 DIAGNOSIS — I4891 Unspecified atrial fibrillation: Secondary | ICD-10-CM

## 2013-08-03 DIAGNOSIS — I498 Other specified cardiac arrhythmias: Secondary | ICD-10-CM | POA: Diagnosis not present

## 2013-08-03 DIAGNOSIS — R001 Bradycardia, unspecified: Secondary | ICD-10-CM

## 2013-08-03 LAB — BASIC METABOLIC PANEL
BUN: 9 mg/dL (ref 6–23)
CALCIUM: 9.4 mg/dL (ref 8.4–10.5)
CO2: 28 mEq/L (ref 19–32)
Chloride: 101 mEq/L (ref 96–112)
Creatinine, Ser: 0.8 mg/dL (ref 0.4–1.2)
GFR: 71.57 mL/min (ref >60.00)
Glucose, Bld: 118 mg/dL — ABNORMAL HIGH (ref 70–99)
POTASSIUM: 4 meq/L (ref 3.5–5.1)
SODIUM: 138 meq/L (ref 135–145)

## 2013-08-03 LAB — CBC WITH DIFFERENTIAL/PLATELET
Basophils Absolute: 0 10*3/uL (ref 0.0–0.1)
Basophils Relative: 0.3 % (ref 0.0–3.0)
Eosinophils Absolute: 0.1 10*3/uL (ref 0.0–0.7)
Eosinophils Relative: 1.8 % (ref 0.0–5.0)
HCT: 35.1 % — ABNORMAL LOW (ref 36.0–46.0)
Hemoglobin: 11.2 g/dL — ABNORMAL LOW (ref 12.0–15.0)
Lymphocytes Relative: 23.1 % (ref 12.0–46.0)
Lymphs Abs: 1.8 10*3/uL (ref 0.7–4.0)
MCHC: 31.9 g/dL (ref 30.0–36.0)
MCV: 81.8 fl (ref 78.0–100.0)
Monocytes Absolute: 0.5 10*3/uL (ref 0.1–1.0)
Monocytes Relative: 6.7 % (ref 3.0–12.0)
Neutro Abs: 5.3 10*3/uL (ref 1.4–7.7)
Neutrophils Relative %: 68.1 % (ref 43.0–77.0)
Platelets: 285 10*3/uL (ref 150.0–400.0)
RBC: 4.29 Mil/uL (ref 3.87–5.11)
RDW: 16 % — ABNORMAL HIGH (ref 11.5–14.6)
WBC: 7.7 10*3/uL (ref 4.5–10.5)

## 2013-08-05 ENCOUNTER — Telehealth: Payer: Self-pay | Admitting: Internal Medicine

## 2013-08-05 ENCOUNTER — Ambulatory Visit (HOSPITAL_COMMUNITY)
Admission: RE | Admit: 2013-08-05 | Discharge: 2013-08-05 | Disposition: A | Discharge status: Home / Self Care | Payer: Medicare Other | Source: Ambulatory Visit | Attending: Internal Medicine | Admitting: Internal Medicine

## 2013-08-05 DIAGNOSIS — I4891 Unspecified atrial fibrillation: Secondary | ICD-10-CM

## 2013-08-05 NOTE — Telephone Encounter (Addendum)
Spoke with Peggy in Radiology and have asked that she page Dr Rayann Heman to discuss with him.  They have made 4 attempts to start her IV and are unsuccessful.  Wants to know how important the test is.  i have asked she page Dr Rayann Heman and speak with him

## 2013-08-05 NOTE — Telephone Encounter (Signed)
New problem       Peggy Rn @ [facility] Radiology called and needs a call back @[phone] pt is there and she has questions.

## 2013-08-06 DIAGNOSIS — Z950 Presence of cardiac pacemaker: Secondary | ICD-10-CM | POA: Diagnosis not present

## 2013-08-06 DIAGNOSIS — E119 Type 2 diabetes mellitus without complications: Secondary | ICD-10-CM | POA: Diagnosis not present

## 2013-08-06 DIAGNOSIS — I4891 Unspecified atrial fibrillation: Secondary | ICD-10-CM | POA: Diagnosis not present

## 2013-08-06 DIAGNOSIS — I509 Heart failure, unspecified: Secondary | ICD-10-CM | POA: Diagnosis not present

## 2013-08-06 DIAGNOSIS — M1990 Unspecified osteoarthritis, unspecified site: Secondary | ICD-10-CM | POA: Diagnosis not present

## 2013-08-06 DIAGNOSIS — I2510 Atherosclerotic heart disease of native coronary artery without angina pectoris: Secondary | ICD-10-CM | POA: Diagnosis not present

## 2013-08-06 DIAGNOSIS — E0590 Thyrotoxicosis, unspecified without thyrotoxic crisis or storm: Secondary | ICD-10-CM | POA: Diagnosis not present

## 2013-08-06 DIAGNOSIS — I10 Essential (primary) hypertension: Secondary | ICD-10-CM | POA: Diagnosis not present

## 2013-08-06 DIAGNOSIS — I5033 Acute on chronic diastolic (congestive) heart failure: Secondary | ICD-10-CM | POA: Diagnosis not present

## 2013-08-09 ENCOUNTER — Encounter (HOSPITAL_COMMUNITY)
Admission: RE | Disposition: A | Discharge status: Home / Self Care | Payer: Self-pay | Source: Ambulatory Visit | Attending: Cardiology

## 2013-08-09 ENCOUNTER — Ambulatory Visit (HOSPITAL_COMMUNITY)
Admission: RE | Admit: 2013-08-09 | Discharge: 2013-08-09 | Disposition: A | Discharge status: Home / Self Care | Payer: Medicare Other | Source: Ambulatory Visit | Attending: Cardiology | Admitting: Cardiology

## 2013-08-09 ENCOUNTER — Encounter (HOSPITAL_COMMUNITY): Payer: Self-pay | Admitting: *Deleted

## 2013-08-09 DIAGNOSIS — Z950 Presence of cardiac pacemaker: Secondary | ICD-10-CM | POA: Diagnosis not present

## 2013-08-09 DIAGNOSIS — I2510 Atherosclerotic heart disease of native coronary artery without angina pectoris: Secondary | ICD-10-CM | POA: Diagnosis not present

## 2013-08-09 DIAGNOSIS — I4891 Unspecified atrial fibrillation: Secondary | ICD-10-CM | POA: Diagnosis not present

## 2013-08-09 DIAGNOSIS — I10 Essential (primary) hypertension: Secondary | ICD-10-CM | POA: Diagnosis not present

## 2013-08-09 DIAGNOSIS — M1990 Unspecified osteoarthritis, unspecified site: Secondary | ICD-10-CM | POA: Diagnosis not present

## 2013-08-09 DIAGNOSIS — I509 Heart failure, unspecified: Secondary | ICD-10-CM | POA: Insufficient documentation

## 2013-08-09 DIAGNOSIS — E0590 Thyrotoxicosis, unspecified without thyrotoxic crisis or storm: Secondary | ICD-10-CM | POA: Insufficient documentation

## 2013-08-09 DIAGNOSIS — E119 Type 2 diabetes mellitus without complications: Secondary | ICD-10-CM | POA: Insufficient documentation

## 2013-08-09 DIAGNOSIS — I5033 Acute on chronic diastolic (congestive) heart failure: Secondary | ICD-10-CM | POA: Insufficient documentation

## 2013-08-09 HISTORY — PX: TEE WITHOUT CARDIOVERSION: SHX5443

## 2013-08-09 SURGERY — ECHOCARDIOGRAM, TRANSESOPHAGEAL
Anesthesia: Moderate Sedation

## 2013-08-09 MED ORDER — BUTAMBEN-TETRACAINE-BENZOCAINE 2-2-14 % EX AERO
INHALATION_SPRAY | CUTANEOUS | Status: DC | PRN
  Administered 2013-08-09: 2 via TOPICAL

## 2013-08-09 MED ORDER — MIDAZOLAM HCL 10 MG/2ML IJ SOLN
INTRAMUSCULAR | Status: DC | PRN
  Administered 2013-08-09: 1 mg via INTRAVENOUS
  Administered 2013-08-09 (×2): 2 mg via INTRAVENOUS
  Administered 2013-08-09: 1 mg via INTRAVENOUS

## 2013-08-09 MED ORDER — FENTANYL CITRATE 0.05 MG/ML IJ SOLN
INTRAMUSCULAR | Status: DC | PRN
Start: 2013-08-09 — End: 2013-08-09
  Administered 2013-08-09 (×2): 25 ug via INTRAVENOUS

## 2013-08-09 MED ORDER — FENTANYL CITRATE 0.05 MG/ML IJ SOLN
INTRAMUSCULAR | Status: AC
  Filled 2013-08-09: qty 2

## 2013-08-09 MED ORDER — SODIUM CHLORIDE 0.9 % IV SOLN: INTRAVENOUS | Status: DC

## 2013-08-09 MED ORDER — MIDAZOLAM HCL 5 MG/ML IJ SOLN
INTRAMUSCULAR | Status: AC
  Filled 2013-08-09: qty 2

## 2013-08-09 NOTE — Discharge Instructions (Signed)
Transesophageal Echocardiography Transesophageal echocardiography (TEE) is a special type of test that produces images of the heart by using sound waves (echocardiogram). This type of echocardiography can obtain better images of the heart than standard echocardiography. TEE is done by passing a flexible tube down the esophagus. The heart is located in front of the esophagus. Because the heart and esophagus are close to one another, your health care provider can take very clear, detailed pictures of the heart via ultrasound waves. TEE may be done:  If your health care provider needs more information based on standard echocardiography findings.  If you had a stroke. This might have happened because a clot formed in your heart. TEE can visualize different areas of the heart and check for clots.  To check valve anatomy and function.  To check for infection on the inside of your heart (endocarditis).  To evaluate the dividing wall (septum) of the heart and presence of a hole that did not close after birth (patent foramen ovale or atrial septal defect).  To help diagnose a tear in the wall of the aorta (aortic dissection).  During cardiac valve surgery. This allows the surgeon to assess the valve repair before closing the chest.  During a variety of other cardiac procedures to guide positioning of catheters.  Sometimes before a cardioversion, which is a shock to convert heart rhythm back to normal. LET [facility] CARE PROVIDER KNOW ABOUT:   Any allergies you have.  All medicines you are taking, including vitamins, herbs, eye drops, creams, and over-the-counter medicines.  Previous problems you or members of your family have had with the use of anesthetics.  Any blood disorders you have.  Previous surgeries you have had.  Medical conditions you have.  Swallowing difficulties.  An esophageal obstruction. RISKS AND COMPLICATIONS  Generally, TEE is a safe procedure. However, as with any  procedure, complications can occur. Possible complications include an esophageal tear (rupture). BEFORE THE PROCEDURE   Do not eat or drink for 6 hours before the procedure or as directed by your health care provider.  Arrange for someone to drive you home after the procedure. Do not drive yourself home. During the procedure, you will be given medicines that can continue to make you feel drowsy and can impair your reflexes.  An IV access tube will be started in the arm. PROCEDURE   A medicine to help you relax (sedative) will be given through the IV access tube.  A medicine that numbs the area (local anesthetic) may be sprayed in the back of the throat.  Your blood pressure, heart rate, and breathing (vital signs) will be monitored during the procedure.  The TEE probe is a long, flexible tube. The tip of the probe is placed into the back of the mouth, and you will be asked to swallow. This helps to pass the tip of the probe into the esophagus. Once the tip of the probe is in the correct area, your health care provider can take pictures of the heart.  TEE is usually not a painful procedure. You may feel the probe press against the back of the throat. The probe does not enter the trachea and does not affect your breathing.  Your time spent at the hospital is usually less than 2 hours. AFTER THE PROCEDURE   You will be in bed, resting, until you have fully returned to consciousness.  When you first awaken, your throat may feel slightly sore and will probably still feel numb. This will  improve slowly over time.  You will not be allowed to eat or drink until it is clear that the numbness has improved.  Once you have been able to drink, urinate, and sit on the edge of the bed without feeling sick to your stomach (nausea) or dizzy, you may be cleared to go home.  You should have a friend or family member with you for the next 24 hours after your procedure. Document Released: 08/10/2002  Document Revised: 03/10/2013 Document Reviewed: 11/19/2012 [facility] Patient Information 2014 [place], [place].

## 2013-08-09 NOTE — H&P (View-Only) (Signed)
PCP: Jani Gravel, MD Primary Cardiologist:  Dr Sallyanne Kuster  Hailey [name] is a 68 y.o. female who presents today for routine electrophysiology followup.  Since her afib ablation, the patient reports doing reasonably well.  She has occasional palpitations and fatigue which she attributes to afib. The episodes continue to be worrisome.  Today, she denies symptoms of chest pain, shortness of breath,  lower extremity edema, dizziness, presyncope, or syncope.  The patient is otherwise without complaint today.   Past Medical History  Diagnosis Date  . Hypertension   . Paroxysmal atrial fibrillation   . Diabetes mellitus   . Allergic rhinitis   . DJD (degenerative joint disease)   . Bradycardia     s/p PPM by Dr Sallyanne Kuster  . CAD (coronary artery disease)     70% RCA stenosis, treated medically  . Polio     as a child  . Pacemaker   . Hyperthyroidism   . Pneumonia     12  . Goiter     thyroid  . Acute on chronic diastolic CHF (congestive heart failure), NYHA class 1 04/29/2013   Past Surgical History  Procedure Laterality Date  . Cholecystectomy    . Total abdominal hysterectomy    . Pacemaker insertion  12/03/10    SJM Accent DR RF implanted by Dr Sallyanne Kuster  . Arm surgery d/t fx Right 96  . Tee without cardioversion N/A 07/30/2012    Procedure: TRANSESOPHAGEAL ECHOCARDIOGRAM (TEE);  Surgeon: Sanda Klein, MD;  Location: [facility] ENDOSCOPY;  Service: Cardiovascular;  Laterality: N/A;  h&p in file-[facility]  . Atrial fibrillation ablation  07/31/2012    PVI by Dr Rayann Heman  . Lumbar laminectomy/decompression microdiscectomy Right 12/23/2012    Procedure: Right L5-S1 Laminotomy for resection of synovial cyst;  Surgeon: Hosie Spangle, MD;  Location: [place] NEURO ORS;  Service: Neurosurgery;  Laterality: Right;  Right Lumbar five-sacral one Laminotomy for resection of synovial cyst    Current Outpatient Prescriptions  Medication Sig Dispense Refill  . acetaminophen (TYLENOL) 500 MG tablet Take 1,000 mg by  mouth every 6 (six) hours as needed for pain.      . Calcium Carbonate-Vitamin D (CALCIUM 600+D) 600-400 MG-UNIT per tablet Take 1 tablet by mouth daily.       . Cholecalciferol (VITAMIN D3) 1000 UNITS CAPS Take 1 capsule by mouth daily.        . colesevelam (WELCHOL) 625 MG tablet Take 1,250 mg by mouth 2 (two) times daily with a meal.       . dabigatran (PRADAXA) 150 MG CAPS capsule Take 150 mg by mouth 2 (two) times daily.      Marland Kitchen diltiazem (DILACOR XR) 180 MG 24 hr capsule Take 1 capsule (180 mg total) by mouth daily.  30 capsule  1  . furosemide (LASIX) 40 MG tablet Take 1 tablet (40 mg total) by mouth daily.  30 tablet  5  . loperamide (IMODIUM A-D) 2 MG tablet Take 2 mg by mouth daily as needed for diarrhea or loose stools.      Marland Kitchen loratadine (ALLERGY RELIEF) 10 MG tablet Take 10 mg by mouth daily.      . metoprolol (LOPRESSOR) 50 MG tablet Take 50 mg by mouth 2 (two) times daily.      Marland Kitchen oxybutynin (DITROPAN) 5 MG tablet Take 5 mg by mouth daily.      . pantoprazole (PROTONIX) 40 MG tablet Take 40 mg by mouth daily.       Marland Kitchen propylthiouracil (  PTU) 50 MG tablet Take 2 tabs in the AM and 1 tab in the PM      . saxagliptin HCl (ONGLYZA) 5 MG TABS tablet Take 5 mg by mouth daily.      . valsartan (DIOVAN) 80 MG tablet Take 80 mg by mouth daily.       No current facility-administered medications for this visit.    Physical Exam: Filed Vitals:   07/23/13 1539  BP: 148/82  Pulse: 78  Height: 4' 11" (1.499 m)  Weight: 150 lb 8 oz (68.266 kg)    GEN- The patient is well appearing, alert and oriented x 3 today.   Head- normocephalic, atraumatic Eyes-  Sclera clear, conjunctiva pink Ears- hearing intact Oropharynx- clear Lungs- Clear to ausculation bilaterally, normal work of breathing Heart- Regular rate and rhythm, no murmurs, rubs or gallops, PMI not laterally displaced GI- soft, NT, ND, + BS Extremities- no clubbing, cyanosis, or edema  Assessment and Plan:  1. afib afib burden  by PPM interrogation today is 17%.  This is not really improved since her last visit. She continues to have some symptoms with her afib.  At times her arrhythmia appears to be more regular and may also represent atrial flutter. Therapeutic strategies for afib and atrial futter including medicine and repeat ablation were discussed in detail with the patient today. Risk, benefits, and alternatives to repeat EP study and radiofrequency ablation for afib were also discussed in detail today. These risks include but are not limited to stroke, bleeding, vascular damage, tamponade, perforation, damage to the esophagus, lungs, and other structures, pulmonary vein stenosis, worsening renal function, and death. The patient understands these risk and wishes to proceed.   As she continues to have shortness of breath, I will order a cardiac CT to assess for pulmonary vein stenosis prior to her repeat procedure.  2. Tachy/brady Normal pacemaker function See Pace Art report No changes today   

## 2013-08-09 NOTE — Progress Notes (Signed)
Echocardiogram Echocardiogram Transesophageal has been performed.  Hailey [name] 08/09/2013, 9:51 AM

## 2013-08-09 NOTE — CV Procedure (Signed)
Procedure: TEE  Indication: Atrial fibrillation, pre-ablation  Sedation: Versed 6 mg IV, Fentanyl 50 mcg IV  Findings: Please see echo section for full report.  Normal LV size and systolic function, EF 35-46%.  Mildly dilated RV with normal systolic function.  Peak RV-RA gradient 64 mmHg.  Trivial MR.  Mild to moderate LA enlargement.  No LAA appendage thrombus.  Negative bubble study.    May proceed to ablation.   Loralie Champagne 08/09/2013 9:27 AM

## 2013-08-09 NOTE — Interval H&P Note (Signed)
History and Physical Interval Note:  08/09/2013 9:17 AM  Hailey [name]  has presented today for surgery, with the diagnosis of AFIB  The various methods of treatment have been discussed with the patient and family. After consideration of risks, benefits and other options for treatment, the patient has consented to  Procedure(s): TRANSESOPHAGEAL ECHOCARDIOGRAM (TEE) (N/A) as a surgical intervention .  The patient's history has been reviewed, patient examined, no change in status, stable for surgery.  I have reviewed the patient's chart and labs.  Questions were answered to the patient's satisfaction.     Bolton Canupp [employer]

## 2013-08-10 ENCOUNTER — Ambulatory Visit (HOSPITAL_COMMUNITY): Payer: Medicare Other | Admitting: Anesthesiology

## 2013-08-10 ENCOUNTER — Encounter (HOSPITAL_COMMUNITY): Payer: Medicare Other | Admitting: Anesthesiology

## 2013-08-10 ENCOUNTER — Ambulatory Visit (HOSPITAL_COMMUNITY)
Admission: RE | Admit: 2013-08-10 | Discharge: 2013-08-11 | Disposition: A | Discharge status: Home / Self Care | Payer: Medicare Other | Source: Ambulatory Visit | Attending: Internal Medicine | Admitting: Internal Medicine

## 2013-08-10 ENCOUNTER — Encounter (HOSPITAL_COMMUNITY): Payer: Self-pay | Admitting: Cardiology

## 2013-08-10 ENCOUNTER — Encounter (HOSPITAL_COMMUNITY)
Admission: RE | Disposition: A | Discharge status: Home / Self Care | Payer: Medicare Other | Source: Ambulatory Visit | Attending: Internal Medicine

## 2013-08-10 DIAGNOSIS — E0500 Thyrotoxicosis with diffuse goiter without thyrotoxic crisis or storm: Secondary | ICD-10-CM | POA: Diagnosis not present

## 2013-08-10 DIAGNOSIS — R6889 Other general symptoms and signs: Secondary | ICD-10-CM

## 2013-08-10 DIAGNOSIS — M1990 Unspecified osteoarthritis, unspecified site: Secondary | ICD-10-CM | POA: Diagnosis not present

## 2013-08-10 DIAGNOSIS — R600 Localized edema: Secondary | ICD-10-CM

## 2013-08-10 DIAGNOSIS — R9389 Abnormal findings on diagnostic imaging of other specified body structures: Secondary | ICD-10-CM | POA: Diagnosis not present

## 2013-08-10 DIAGNOSIS — I10 Essential (primary) hypertension: Secondary | ICD-10-CM | POA: Insufficient documentation

## 2013-08-10 DIAGNOSIS — I498 Other specified cardiac arrhythmias: Secondary | ICD-10-CM | POA: Insufficient documentation

## 2013-08-10 DIAGNOSIS — Z8612 Personal history of poliomyelitis: Secondary | ICD-10-CM | POA: Diagnosis not present

## 2013-08-10 DIAGNOSIS — I5032 Chronic diastolic (congestive) heart failure: Secondary | ICD-10-CM | POA: Diagnosis not present

## 2013-08-10 DIAGNOSIS — Z950 Presence of cardiac pacemaker: Secondary | ICD-10-CM | POA: Diagnosis not present

## 2013-08-10 DIAGNOSIS — I4891 Unspecified atrial fibrillation: Secondary | ICD-10-CM | POA: Diagnosis not present

## 2013-08-10 DIAGNOSIS — R911 Solitary pulmonary nodule: Secondary | ICD-10-CM

## 2013-08-10 DIAGNOSIS — I4892 Unspecified atrial flutter: Secondary | ICD-10-CM | POA: Insufficient documentation

## 2013-08-10 DIAGNOSIS — I4821 Permanent atrial fibrillation: Secondary | ICD-10-CM | POA: Diagnosis present

## 2013-08-10 DIAGNOSIS — R112 Nausea with vomiting, unspecified: Secondary | ICD-10-CM

## 2013-08-10 DIAGNOSIS — Z7901 Long term (current) use of anticoagulants: Secondary | ICD-10-CM | POA: Insufficient documentation

## 2013-08-10 DIAGNOSIS — E119 Type 2 diabetes mellitus without complications: Secondary | ICD-10-CM | POA: Insufficient documentation

## 2013-08-10 DIAGNOSIS — J159 Unspecified bacterial pneumonia: Secondary | ICD-10-CM

## 2013-08-10 DIAGNOSIS — R197 Diarrhea, unspecified: Secondary | ICD-10-CM

## 2013-08-10 DIAGNOSIS — I5030 Unspecified diastolic (congestive) heart failure: Secondary | ICD-10-CM

## 2013-08-10 DIAGNOSIS — I5033 Acute on chronic diastolic (congestive) heart failure: Secondary | ICD-10-CM

## 2013-08-10 DIAGNOSIS — J101 Influenza due to other identified influenza virus with other respiratory manifestations: Secondary | ICD-10-CM

## 2013-08-10 DIAGNOSIS — J189 Pneumonia, unspecified organism: Secondary | ICD-10-CM

## 2013-08-10 DIAGNOSIS — I509 Heart failure, unspecified: Secondary | ICD-10-CM | POA: Insufficient documentation

## 2013-08-10 DIAGNOSIS — R001 Bradycardia, unspecified: Secondary | ICD-10-CM | POA: Diagnosis present

## 2013-08-10 DIAGNOSIS — R590 Localized enlarged lymph nodes: Secondary | ICD-10-CM

## 2013-08-10 DIAGNOSIS — J309 Allergic rhinitis, unspecified: Secondary | ICD-10-CM | POA: Insufficient documentation

## 2013-08-10 DIAGNOSIS — I2510 Atherosclerotic heart disease of native coronary artery without angina pectoris: Secondary | ICD-10-CM | POA: Insufficient documentation

## 2013-08-10 DIAGNOSIS — I480 Paroxysmal atrial fibrillation: Secondary | ICD-10-CM | POA: Diagnosis present

## 2013-08-10 HISTORY — PX: ATRIAL FIBRILLATION ABLATION: SHX5456

## 2013-08-10 LAB — POCT ACTIVATED CLOTTING TIME
ACTIVATED CLOTTING TIME: 188 s
ACTIVATED CLOTTING TIME: 177 s
Activated Clotting Time: 381 seconds
Activated Clotting Time: 448 seconds

## 2013-08-10 LAB — GLUCOSE, CAPILLARY
GLUCOSE-CAPILLARY: 127 mg/dL — AB (ref 70–99)
GLUCOSE-CAPILLARY: 159 mg/dL — AB (ref 70–99)
Glucose-Capillary: 129 mg/dL — ABNORMAL HIGH (ref 70–99)
Glucose-Capillary: 134 mg/dL — ABNORMAL HIGH (ref 70–99)

## 2013-08-10 SURGERY — ATRIAL FIBRILLATION ABLATION
Anesthesia: Monitor Anesthesia Care

## 2013-08-10 MED ORDER — HEPARIN SODIUM (PORCINE) 1000 UNIT/ML IJ SOLN
INTRAMUSCULAR | Status: DC | PRN
  Administered 2013-08-10: 12000 [IU] via INTRAVENOUS

## 2013-08-10 MED ORDER — DABIGATRAN ETEXILATE MESYLATE 150 MG PO CAPS
150.0000 mg | ORAL_CAPSULE | Freq: Two times a day (BID) | ORAL | Status: DC
  Administered 2013-08-10 – 2013-08-11 (×2): 150 mg via ORAL
  Filled 2013-08-10 (×4): qty 1

## 2013-08-10 MED ORDER — METOPROLOL TARTRATE 50 MG PO TABS
50.0000 mg | ORAL_TABLET | Freq: Two times a day (BID) | ORAL | Status: DC
  Administered 2013-08-10 – 2013-08-11 (×2): 50 mg via ORAL
  Filled 2013-08-10 (×4): qty 1
  Filled 2013-08-10: qty 2

## 2013-08-10 MED ORDER — OXYBUTYNIN CHLORIDE ER 5 MG PO TB24
5.0000 mg | ORAL_TABLET | Freq: Every day | ORAL | Status: DC
  Filled 2013-08-10 (×3): qty 1

## 2013-08-10 MED ORDER — SODIUM CHLORIDE 0.9 % IJ SOLN
3.0000 mL | Freq: Two times a day (BID) | INTRAMUSCULAR | Status: DC
  Administered 2013-08-10: 3 mL via INTRAVENOUS

## 2013-08-10 MED ORDER — MIDAZOLAM HCL 5 MG/5ML IJ SOLN
INTRAMUSCULAR | Status: DC | PRN
  Administered 2013-08-10 (×2): 1 mg via INTRAVENOUS

## 2013-08-10 MED ORDER — ONDANSETRON HCL 4 MG/2ML IJ SOLN
INTRAMUSCULAR | Status: DC | PRN
  Administered 2013-08-10 (×2): 4 mg via INTRAVENOUS

## 2013-08-10 MED ORDER — FENTANYL CITRATE 0.05 MG/ML IJ SOLN
INTRAMUSCULAR | Status: DC | PRN
  Administered 2013-08-10: 25 ug via INTRAVENOUS
  Administered 2013-08-10: 50 ug via INTRAVENOUS
  Administered 2013-08-10 (×6): 25 ug via INTRAVENOUS
  Administered 2013-08-10: 50 ug via INTRAVENOUS
  Administered 2013-08-10: 25 ug via INTRAVENOUS

## 2013-08-10 MED ORDER — SODIUM CHLORIDE 0.9 % IV SOLN: 250.0000 mL | INTRAVENOUS | Status: DC | PRN

## 2013-08-10 MED ORDER — SODIUM CHLORIDE 0.9 % IV SOLN
INTRAVENOUS | Status: DC | PRN
  Administered 2013-08-10: 07:00:00 via INTRAVENOUS

## 2013-08-10 MED ORDER — HEPARIN SODIUM (PORCINE) 1000 UNIT/ML IJ SOLN
INTRAMUSCULAR | Status: AC
  Filled 2013-08-10: qty 1

## 2013-08-10 MED ORDER — ONDANSETRON HCL 4 MG/2ML IJ SOLN
4.0000 mg | INTRAMUSCULAR | Status: DC | PRN
  Administered 2013-08-10 (×2): 4 mg via INTRAVENOUS
  Filled 2013-08-10 (×2): qty 2

## 2013-08-10 MED ORDER — PANTOPRAZOLE SODIUM 40 MG PO TBEC
40.0000 mg | DELAYED_RELEASE_TABLET | Freq: Every day | ORAL | Status: DC
  Administered 2013-08-11: 40 mg via ORAL
  Filled 2013-08-10: qty 1

## 2013-08-10 MED ORDER — BUPIVACAINE HCL (PF) 0.25 % IJ SOLN
INTRAMUSCULAR | Status: AC
  Filled 2013-08-10: qty 30

## 2013-08-10 MED ORDER — PROPOFOL INFUSION 10 MG/ML OPTIME
INTRAVENOUS | Status: DC | PRN
  Administered 2013-08-10: 25 ug/kg/min via INTRAVENOUS

## 2013-08-10 MED ORDER — SODIUM CHLORIDE 0.9 % IJ SOLN: 3.0000 mL | INTRAMUSCULAR | Status: DC | PRN

## 2013-08-10 MED ORDER — PROTAMINE SULFATE 10 MG/ML IV SOLN
INTRAVENOUS | Status: DC | PRN
  Administered 2013-08-10: 40 mg via INTRAVENOUS

## 2013-08-10 MED ORDER — DILTIAZEM HCL ER 180 MG PO CP24
180.0000 mg | ORAL_CAPSULE | Freq: Every day | ORAL | Status: DC
  Administered 2013-08-11: 180 mg via ORAL
  Filled 2013-08-10 (×2): qty 1

## 2013-08-10 NOTE — Anesthesia Preprocedure Evaluation (Addendum)
Anesthesia Evaluation  Patient identified by MRN, date of birth, ID band Patient awake    Reviewed: Allergy & Precautions, H&P , NPO status , Patient's Chart, lab work & pertinent test results, reviewed documented beta blocker date and time   History of Anesthesia Complications Negative for: history of anesthetic complications  Airway Mallampati: II TM Distance: >3 FB Neck ROM: Full    Dental  (+) Dental Advisory Given   Pulmonary neg pulmonary ROS,  breath sounds clear to auscultation        Cardiovascular hypertension, + CAD + dysrhythmias Atrial Fibrillation Rhythm:Irregular Rate:Normal     Neuro/Psych negative neurological ROS     GI/Hepatic   Endo/Other  diabetesMorbid obesity  Renal/GU   negative genitourinary   Musculoskeletal   Abdominal (+) + obese,   Peds  Hematology  (+) anemia ,   Anesthesia Other Findings   Reproductive/Obstetrics                          Anesthesia Physical Anesthesia Plan  ASA: III  Anesthesia Plan: MAC   Post-op Pain Management:    Induction: Intravenous  Airway Management Planned: Simple Face Mask  Additional Equipment:   Intra-op Plan:   Post-operative Plan:   Informed Consent: I have reviewed the patients History and Physical, chart, labs and discussed the procedure including the risks, benefits and alternatives for the proposed anesthesia with the patient or authorized representative who has indicated his/her understanding and acceptance.     Plan Discussed with: CRNA and Surgeon  Anesthesia Plan Comments:        Anesthesia Quick Evaluation

## 2013-08-10 NOTE — Progress Notes (Signed)
Utilization Review Completed.Donne Anon T3/03/2014

## 2013-08-10 NOTE — Addendum Note (Signed)
Addendum created 08/10/13 1138 by Octavio Graves   Modules edited: Anesthesia Medication Administration

## 2013-08-10 NOTE — Anesthesia Postprocedure Evaluation (Signed)
  Anesthesia Post-op Note  Patient: Hailey [name]  Procedure(s) Performed: Procedure(s): ATRIAL FIBRILLATION ABLATION (N/A)  Patient Location: Cath Lab  Anesthesia Type:MAC  Level of Consciousness: awake  Airway and Oxygen Therapy: Patient Spontanous Breathing  Post-op Pain: none  Post-op Assessment: Post-op Vital signs reviewed, Patient's Cardiovascular Status Stable, Respiratory Function Stable, Patent Airway, No signs of Nausea or vomiting and Pain level controlled  Post-op Vital Signs: Reviewed and stable  Complications: No apparent anesthesia complications

## 2013-08-10 NOTE — Preoperative (Signed)
Beta Blockers   Reason not to administer Beta Blockers:Not Applicable 

## 2013-08-10 NOTE — H&P (View-Only) (Signed)
PCP: Jani Gravel, MD Primary Cardiologist:  Dr Sallyanne Kuster  Hailey [name] is a 68 y.o. female who presents today for routine electrophysiology followup.  Since her afib ablation, the patient reports doing reasonably well.  She has occasional palpitations and fatigue which she attributes to afib. The episodes continue to be worrisome.  Today, she denies symptoms of chest pain, shortness of breath,  lower extremity edema, dizziness, presyncope, or syncope.  The patient is otherwise without complaint today.   Past Medical History  Diagnosis Date  . Hypertension   . Paroxysmal atrial fibrillation   . Diabetes mellitus   . Allergic rhinitis   . DJD (degenerative joint disease)   . Bradycardia     s/p PPM by Dr Sallyanne Kuster  . CAD (coronary artery disease)     70% RCA stenosis, treated medically  . Polio     as a child  . Pacemaker   . Hyperthyroidism   . Pneumonia     12  . Goiter     thyroid  . Acute on chronic diastolic CHF (congestive heart failure), NYHA class 1 04/29/2013   Past Surgical History  Procedure Laterality Date  . Cholecystectomy    . Total abdominal hysterectomy    . Pacemaker insertion  12/03/10    SJM [employer] DR RF implanted by Dr Sallyanne Kuster  . Arm surgery d/t fx Right 96  . Tee without cardioversion N/A 07/30/2012    Procedure: TRANSESOPHAGEAL ECHOCARDIOGRAM (TEE);  Surgeon: Sanda Klein, MD;  Location: [facility] ENDOSCOPY;  Service: Cardiovascular;  Laterality: N/A;  h&p in file-hope  . Atrial fibrillation ablation  07/31/2012    PVI by Dr Rayann Heman  . Lumbar laminectomy/decompression microdiscectomy Right 12/23/2012    Procedure: Right L5-S1 Laminotomy for resection of synovial cyst;  Surgeon: Hosie Spangle, MD;  Location: [facility] [facility] NEURO ORS;  Service: Neurosurgery;  Laterality: Right;  Right Lumbar five-sacral one Laminotomy for resection of synovial cyst    Current Outpatient Prescriptions  Medication Sig Dispense Refill  . acetaminophen (TYLENOL) 500 MG tablet Take 1,000 mg by  mouth every 6 (six) hours as needed for pain.      . Calcium Carbonate-Vitamin D (CALCIUM 600+D) 600-400 MG-UNIT per tablet Take 1 tablet by mouth daily.       . Cholecalciferol (VITAMIN D3) 1000 UNITS CAPS Take 1 capsule by mouth daily.        . colesevelam (WELCHOL) 625 MG tablet Take 1,250 mg by mouth 2 (two) times daily with a meal.       . dabigatran (PRADAXA) 150 MG CAPS capsule Take 150 mg by mouth 2 (two) times daily.      Marland Kitchen diltiazem (DILACOR XR) 180 MG 24 hr capsule Take 1 capsule (180 mg total) by mouth daily.  30 capsule  1  . furosemide (LASIX) 40 MG tablet Take 1 tablet (40 mg total) by mouth daily.  30 tablet  5  . loperamide (IMODIUM A-D) 2 MG tablet Take 2 mg by mouth daily as needed for diarrhea or loose stools.      Marland Kitchen loratadine (ALLERGY RELIEF) 10 MG tablet Take 10 mg by mouth daily.      . metoprolol (LOPRESSOR) 50 MG tablet Take 50 mg by mouth 2 (two) times daily.      Marland Kitchen oxybutynin (DITROPAN) 5 MG tablet Take 5 mg by mouth daily.      . pantoprazole (PROTONIX) 40 MG tablet Take 40 mg by mouth daily.       Marland Kitchen propylthiouracil (  PTU) 50 MG tablet Take 2 tabs in the AM and 1 tab in the PM      . saxagliptin HCl (ONGLYZA) 5 MG TABS tablet Take 5 mg by mouth daily.      . valsartan (DIOVAN) 80 MG tablet Take 80 mg by mouth daily.       No current facility-administered medications for this visit.    Physical Exam: Filed Vitals:   07/23/13 1539  BP: 148/82  Pulse: 78  Height: 4\' 11"  (1.499 m)  Weight: 150 lb 8 oz (68.266 kg)    GEN- The patient is well appearing, alert and oriented x 3 today.   Head- normocephalic, atraumatic Eyes-  Sclera clear, conjunctiva pink Ears- hearing intact Oropharynx- clear Lungs- Clear to ausculation bilaterally, normal work of breathing Heart- Regular rate and rhythm, no murmurs, rubs or gallops, PMI not laterally displaced GI- soft, NT, ND, + BS Extremities- no clubbing, cyanosis, or edema  Assessment and Plan:  1. afib afib burden  by PPM interrogation today is 17%.  This is not really improved since her last visit. She continues to have some symptoms with her afib.  At times her arrhythmia appears to be more regular and may also represent atrial flutter. Therapeutic strategies for afib and atrial futter including medicine and repeat ablation were discussed in detail with the patient today. Risk, benefits, and alternatives to repeat EP study and radiofrequency ablation for afib were also discussed in detail today. These risks include but are not limited to stroke, bleeding, vascular damage, tamponade, perforation, damage to the esophagus, lungs, and other structures, pulmonary vein stenosis, worsening renal function, and death. The patient understands these risk and wishes to proceed.   As she continues to have shortness of breath, I will order a cardiac CT to assess for pulmonary vein stenosis prior to her repeat procedure.  2. Tachy/brady Normal pacemaker function See Pace Art report No changes today

## 2013-08-10 NOTE — Anesthesia Procedure Notes (Signed)
Procedure Name: MAC Date/Time: 08/10/2013 7:45 AM Performed by: Octavio Graves Pre-anesthesia Checklist: Patient identified, Timeout performed, Emergency Drugs available, Suction available and Patient being monitored Patient Re-evaluated:Patient Re-evaluated prior to inductionOxygen Delivery Method: Simple face mask Placement Confirmation: positive ETCO2 Dental Injury: Teeth and Oropharynx as per pre-operative assessment

## 2013-08-10 NOTE — Discharge Summary (Signed)
ELECTROPHYSIOLOGY PROCEDURE DISCHARGE SUMMARY    Patient ID: Hailey [name],  MRN: [id number], DOB/AGE: 11/14/1945 68 y.o.  Admit date: 08/10/2013 Discharge date: 08/11/2013  Primary Care Physician: Jani Gravel, MD Primary Cardiologist: Sanda Klein, MD Electrophysiologist: Thompson Grayer, MD  Primary Discharge Diagnosis:  Paroxysmal atrial fibrillation and atrial flutter status post ablation this admission  Secondary Discharge Diagnoses:  1.  Hypertension 2.  Diabetes 3.  Symptomatic bradycardia s/p STJ dual chamber pacemaker implantation 4.  CAD - medically treated 5.  Hyperthyroidism  Procedures This Admission:  1.  Electrophysiology study and radiofrequency catheter ablation on 08-10-13 by Dr Thompson Grayer.  This study demonstrated sinus rhythm upon presentation; rotational Angiography reveals a moderate sized left atrium with four separate pulmonary veins without evidence of pulmonary vein stenosis; return of electrical activity within the left superior and left inferior pulmonary veins at baseline. The right superior pulmonary vein was quiescent from a prior ablation procedure. As with her previous procedure, the circular catheter could not adequately engage the right inferior pulmonary vein for electrical assessment due to its angulation. The patient underwent successful electrical re-isolation and anatomical encircling of the left superior and inferion pulmonary veins using radiofrequency current with a circular mapping catheter as a guide. I empirically encircled the right inferior pulmonary vein; cavo-tricuspid isthmus ablation was performed; multiple atypical atrial flutter circuits were visualized today but were too unstable for mapping and quickly degenerated into atrial fibrillation; successful cardioversion to sinus rhythm. There were no early apparent complications.   Brief HPI: Please see Dr. Jackalyn Lombard office note for full details.  Hailey [name] is a 68 y.o. female with a  history of paroxysmal atrial fibrillation.  She has previously undergone PVI in 07/2012.  She has failed medical therapy with Multaq, diltiazem and metoprolol. Risks, benefits, and alternatives to catheter ablation of atrial fibrillation were reviewed with the patient who wished to proceed.  She underwent TEE prior to the procedure which demonstrated normal LV function and no LAA thrombus.    Hospital Course:  [name] was admitted and underwent EPS/RFCA of atrial fibrillation with details as outlined above.  Her STJ pacemaker was interrogated after the procedure and found to be functioning normally.  She was monitored on telemetry overnight which demonstrated SR / A paced V sensed.  Groin notable for small hematoma but was otherwise without complication on the day of discharge.  She was seen and examined by Dr. Caryl Comes and considered to be stable for discharge.  Wound care and restrictions were reviewed.  She will follow-up with Dr Rayann Heman in 12 weeks.  Discharge Vitals: Blood pressure 125/64, pulse 63, temperature 97.5 F (36.4 C), temperature source Oral, resp. rate 19, height 4\' 11"  (1.499 m), weight 150 lb (68.04 kg), SpO2 99.00%.    Labs: Lab Results  Component Value Date   WBC 7.7 08/03/2013   HGB 11.2* 08/03/2013   HCT 35.1* 08/03/2013   MCV 81.8 08/03/2013   PLT 285.0 08/03/2013     Recent Labs Lab 08/11/13 0237  NA 140  K 3.6*  CL 104  CO2 25  BUN 10  CREATININE 0.69  CALCIUM 8.7  GLUCOSE 97    Discharge Medications:    Medication List         ALLERGY RELIEF 10 MG tablet  Generic drug:  loratadine  Take 10 mg by mouth daily.     CALCIUM CARBONATE PO  Take 600 mg by mouth daily.     colesevelam 625 MG tablet  Commonly known as:  WELCHOL  Take 625 mg by mouth 2 (two) times daily as needed (based on eating habits).     dabigatran 150 MG Caps capsule  Commonly known as:  PRADAXA  Take 150 mg by mouth 2 (two) times daily.     diltiazem 180 MG 24 hr capsule  Commonly  known as:  DILACOR XR  Take 1 capsule (180 mg total) by mouth daily.     furosemide 40 MG tablet  Commonly known as:  LASIX  Take 40 mg by mouth daily.     IMODIUM A-D 2 MG tablet  Generic drug:  loperamide  Take 2 mg by mouth 2 (two) times daily as needed for diarrhea or loose stools.     metoprolol 50 MG tablet  Commonly known as:  LOPRESSOR  Take 50 mg by mouth 2 (two) times daily.     ONGLYZA 5 MG Tabs tablet  Generic drug:  saxagliptin HCl  Take 5 mg by mouth daily.     oxybutynin 5 MG 24 hr tablet  Commonly known as:  DITROPAN-XL  Take 5 mg by mouth at bedtime.     pantoprazole 40 MG tablet  Commonly known as:  PROTONIX  Take 40 mg by mouth daily.     propylthiouracil 50 MG tablet  Commonly known as:  PTU  Take 50-100 mg by mouth daily. Takes 2 tablets every morning and 1 tablet every afternoon.     valsartan 80 MG tablet  Commonly known as:  DIOVAN  Take 80 mg by mouth daily.     Vitamin D3 1000 UNITS Caps  Take 1 capsule by mouth daily.        Follow-up Appointments: Follow-up Information   Follow up with Sanda Klein, MD On 08/26/2013. (At 3:15 PM)    Specialty:  Cardiology   Contact information:   [address] [place] [address] [phone]       Follow up with Thompson Grayer, MD On 11/10/2013. (At 10:00 AM)    Specialty:  Cardiology   Contact information:   [address] [place] [address] [phone]      Disposition: Stable  Duration of Discharge Encounter: Greater than 30 minutes including physician time.  Manson Passey, PA-C 08/11/2012 9:54 AM

## 2013-08-10 NOTE — Transfer of Care (Signed)
Immediate Anesthesia Transfer of Care Note  Patient: Hailey [name]  Procedure(s) Performed: Procedure(s): ATRIAL FIBRILLATION ABLATION (N/A)  Patient Location: PACU  Anesthesia Type:MAC  Level of Consciousness: sedated  Airway & Oxygen Therapy: Patient Spontanous Breathing and Patient connected to nasal cannula oxygen  Post-op Assessment: Report given to PACU RN and Post -op Vital signs reviewed and stable  Post vital signs: Reviewed and stable  Complications: No apparent anesthesia complications

## 2013-08-10 NOTE — Addendum Note (Signed)
Addendum created 08/10/13 1334 by Octavio Graves   Modules edited: Notes Section   Notes Section:  File: [id number]

## 2013-08-10 NOTE — Interval H&P Note (Signed)
History and Physical Interval Note:  08/10/2013 7:29 AM  Hailey [name]  has presented today for surgery, with the diagnosis of afib  The various methods of treatment have been discussed with the patient and family. After consideration of risks, benefits and other options for treatment, the patient has consented to  Procedure(s): ATRIAL FIBRILLATION ABLATION (N/A) as a surgical intervention .  The patient's history has been reviewed, patient examined, no change in status, stable for surgery.  I have reviewed the patient's chart and labs.  Questions were answered to the patient's satisfaction.     Thompson Grayer

## 2013-08-10 NOTE — Op Note (Addendum)
SURGEON:  Thompson Grayer, MD  PREPROCEDURE DIAGNOSES: 1. Paroxysmal atrial fibrillation. 2. Typical appearing atrial flutter  POSTPROCEDURE DIAGNOSES: 1. Paroxysmal  atrial fibrillation. 2. Typical appearing atrial flutter as well as multiple atypical flutter circuits  PROCEDURES: 1. Comprehensive electrophysiologic study. 2. Coronary sinus pacing and recording. 3. Three-dimensional mapping of atrial fibrillation with additional mapping and ablation of a second discrete focus (atrial flutter) 4. Ablation of atrial fibrillation with additional mapping and ablation of a second discrete focus (atrial flutter) 5. Intracardiac echocardiography. 6. Transseptal puncture of an intact septum. 7. Rotational Angiography with processing at an independent workstation 8. Arrhythmia induction with pacing  9. Pacemaker interrogation and reprogramming  INTRODUCTION:  Hailey [name] is a 68 y.o. female with a history of paroxysmal atrial fibrillation and typical appearing atrial flutter who now presents for EP study and radiofrequency ablation.  The patient reports initially being diagnosed with atrial fibrillation after presenting with symptomatic palpitations and fatgiue. The patient reports increasing frequency and duration of atrial arrhythmias since that time.  The patient has failed medical therapy and underwent atrial fibrillation ablation by me previously.  She did very well initially but has developed recurrent symptomatic afib.  The patient therefore presents today for repeat catheter ablation of atrial fibrillation and atrial flutter.  DESCRIPTION OF PROCEDURE:  Informed written consent was obtained, and the patient was brought to the electrophysiology lab in a fasting state.  The patient was adequately sedated with intravenous medications as outlined in the anesthesia report.  Her pacemaker was interrogated for baseline measurements. The patient's left and right groins were prepped and draped in the  usual sterile fashion by the EP lab staff.  Using a percutaneous Seldinger technique, two 7-French and one 11-French hemostasis sheaths were placed into the right common femoral vein.    3 Dimensional Rotational Angiography: A 5 french pigtail catheter was introduced through the right common femoral vein and advanced into the inferior venocava.  3 demential rotational angiography was then performed by power injection of 100cc of nonionic contrast.  Reprocessing at an independent work station was then performed.   This demonstrated a moderate sized left atrium with 4 separate pulmonary veins which were also moderate in size.  There were no anomalous veins or significant abnormalities.  A 3 dimensional rendering of the left atrium was then merged using [employer] onto the [employer] system and registered with intracardiac echo (see below).  The pigtail catheter was then removed.  Catheter Placement:  A 7-French Biosense Webster Decapolar coronary sinus catheter was introduced through the right common femoral vein and advanced into the coronary sinus for recording and pacing from this location.  A quadrapolar catheter was introduced through the right common femoral vein and advanced into the right ventricle for recording and pacing.  This catheter was then pulled back to the His bundle location.    Initial Measurements: The patient presented to the electrophysiology lab in sinus rhythm.  The patients PR interval measured 192 msec with a QRS duration of 92 msec and a QT interval of 472 msec.  The AH interval measured 69 msec and the HV interval measured 47 msec.     Intracardiac Echocardiography: A 10-French Biosense Webster AcuNav intracardiac echocardiography catheter was introduced through the left common femoral vein and advanced into the right atrium. Intracardiac echocardiography was performed of the left atrium, and a three-dimensional anatomical rendering of the left atrium was  performed using CARTO sound technology.  The patient was noted to have  a moderate sized left atrium.  The interatrial septum was prominent but not aneurysmal. All 4 pulmonary veins were visualized and noted to have separate ostia.  The pulmonary veins were moderate in size.  The left atrial appendage was visualized and did not reveal thrombus.   There was no evidence of pulmonary vein stenosis.   Transseptal Puncture: The middle right common femoral vein sheath was exchanged for an 8.5 [place] SL2 transseptal sheath and transseptal access was achieved in a standard fashion using a Brockenbrough needle under biplane fluoroscopy with intracardiac echocardiography confirmation of the transseptal puncture.  Once transseptal access had been achieved, heparin was administered intravenously and intra- arterially in order to maintain an ACT of greater than 350 seconds throughout the procedure.   3D Mapping and Ablation: The His bundle catheter was removed and in its place a 3.5 mm Biosense TransMontaigne ablation catheter was advanced into the right atrium.  The transseptal sheath was pulled back into the IVC over a guidewire.  The ablation catheter was advanced across the transseptal hole using the wire as a guide.  The transseptal sheath was then re-advanced over the guidewire into the left atrium.  A duodecapolar Bios[employer] Webster circular mapping catheter was introduced through the transseptal sheath and positioned over the mouth of all 4 pulmonary veins.  Three-dimensional electroanatomical mapping was performed using CARTO technology.  This demonstrated electrical activity within the left superior and left inferior pulmonary veins at baseline.  The right superior pulmonary vein was quiescent from a prior ablation procedure.  As with her previous procedure, the circular catheter could not adequately engage the right inferior pulmonary vein for electrical assessment due to its angulation. The patient  underwent successful electrical re-isolation and anatomical encircling of the left superior and inferion pulmonary veins using radiofrequency current with a circular mapping catheter as a guide.  I empirically encircled the right inferior pulmonary vein with RF also. The ablation catheter was then pulled back into the right atrial and positioned along the cavo-tricuspid isthmus.  Mapping along the atrial side of the isthmus was performed.  This demonstrated a standard isthmus.  A series of radiofrequency applications were then delivered along the isthmus.  With catheter manipulation along the isthmus, the patient developed atypical atrial flutter.  Multiple atypical atrial flutter circuits were observed before the patient degenerated into atrial fibrillation.  Due to the multiplicity of atrial flutter circuits and their instable nature, these more atypical circuits could not be mapped or ablated today. She was successfully cardioverted to sinus rhythm with a single synchronized 200J biphasic shock delivered.  She remained in sinus rhythm thereafter.    Measurements Following Ablation:  In sinus rhythm with RR interval was 996 msec, with PR 208 msec, QRS 92 msec, and Qt 405 msec.  Following ablation the AH interval measured 69 msec with an HV interval of 47 msec. Ventricular pacing was performed, which revealed VA dissociation when pacing at 600 msec.  Rapid atrial pacing was performed, which revealed an AV Wenckebach cycle length of 640 msec.  No arrhythmias were induced with atrial pacing down to a cycle length of 250 msec.  Electroisolation was then again confirmed in all four pulmonary veins.  Intracardiac echocardiography was again performed, which revealed no pericardial effusion.  The procedure was therefore considered completed.  All catheters were removed, and the sheaths were aspirated and flushed.  The patient was transferred to the recovery area for sheath removal per protocol.  A limited bedside  transthoracic echocardiogram  revealed no pericardial effusion.  The patients pacemaker was interrogated and atrial and ventricular sensing, impedance, and threshold values were unchanged when compared to prior to the procedure.  There were no early apparent complications.  CONCLUSIONS: 1. Sinus rhythm upon presentation.   2. Rotational Angiography reveals a moderate sized left atrium with four separate pulmonary veins without evidence of pulmonary vein stenosis. 3. Return of electrical activity within the left superior and left inferior pulmonary veins at baseline.  The right superior pulmonary vein was quiescent from a prior ablation procedure.  As with her previous procedure, the circular catheter could not adequately engage the right inferior pulmonary vein for electrical assessment due to its angulation. The patient underwent successful electrical re-isolation and anatomical encircling of the left superior and inferion pulmonary veins using radiofrequency current with a circular mapping catheter as a guide.  I empirically encircled the right inferior pulmonary vein. 4. Cavo-tricuspid isthmus ablation was performed  5. Multiple atypical atrial flutter circuits were visualized today but were too unstable for mapping and quickly degenerated into atrial fibrillation 6. Successful cardioversion to sinus rhythm 7. No early apparent complications.   Jeneen Rinks Alaya Iverson,MD 10:47 AM 08/10/2013

## 2013-08-11 ENCOUNTER — Encounter (HOSPITAL_COMMUNITY): Payer: Self-pay

## 2013-08-11 DIAGNOSIS — I2510 Atherosclerotic heart disease of native coronary artery without angina pectoris: Secondary | ICD-10-CM | POA: Diagnosis not present

## 2013-08-11 DIAGNOSIS — I4892 Unspecified atrial flutter: Secondary | ICD-10-CM | POA: Diagnosis not present

## 2013-08-11 DIAGNOSIS — I10 Essential (primary) hypertension: Secondary | ICD-10-CM | POA: Diagnosis not present

## 2013-08-11 DIAGNOSIS — I498 Other specified cardiac arrhythmias: Secondary | ICD-10-CM | POA: Diagnosis not present

## 2013-08-11 DIAGNOSIS — I4891 Unspecified atrial fibrillation: Secondary | ICD-10-CM

## 2013-08-11 DIAGNOSIS — E119 Type 2 diabetes mellitus without complications: Secondary | ICD-10-CM | POA: Diagnosis not present

## 2013-08-11 LAB — BASIC METABOLIC PANEL
BUN: 10 mg/dL (ref 6–23)
CALCIUM: 8.7 mg/dL (ref 8.4–10.5)
CO2: 25 mEq/L (ref 19–32)
CREATININE: 0.69 mg/dL (ref 0.50–1.10)
Chloride: 104 mEq/L (ref 96–112)
GFR calc Af Amer: >90 mL/min (ref >90)
GFR, EST NON AFRICAN AMERICAN: 87 mL/min — AB (ref >90)
GLUCOSE: 97 mg/dL (ref 70–99)
Potassium: 3.6 mEq/L — ABNORMAL LOW (ref 3.7–5.3)
Sodium: 140 mEq/L (ref 137–147)

## 2013-08-11 NOTE — Progress Notes (Signed)
Pt given discharge instructions and verbalized understanding. Pt alert and oriented and pain free. Husband is at bedside to take home.

## 2013-08-11 NOTE — Progress Notes (Signed)
    Patient: Hailey [name] Date of Encounter: 08/11/2013, 7:11 AM Admit date: 08/10/2013     Subjective  Hailey [name] reports nausea overnight but denies CP or SOB.   Objective  Physical Exam: Vitals: BP 135/66  Pulse 60  Temp(Src) 98 F (36.7 C) (Oral)  Resp 16  Ht 4\' 11"  (1.499 m)  Wt 150 lb (68.04 kg)  BMI 30.28 kg/m2  SpO2 99% General: Well developed, well appearing 68 year old female in no acute distress. Neck: Supple. JVD not elevated. Lungs: Clear bilaterally to auscultation without wheezes, rales, or rhonchi. Breathing is unlabored. Heart: RRR S1 S2 without murmurs, rubs, or gallops.  Abdomen: Soft, non-distended. Extremities: No clubbing or cyanosis. No edema.  Distal pedal pulses are 2+ and equal bilaterally. Right groin site intact without bleeding or hematoma. Neuro: Alert and oriented X 3. Moves all extremities spontaneously. No focal deficits.  Intake/Output:  Intake/Output Summary (Last 24 hours) at 08/11/13 0711 Last data filed at 08/10/13 2300  Gross per 24 hour  Intake    943 ml  Output    400 ml  Net    543 ml    Inpatient Medications:  . dabigatran  150 mg Oral BID  . diltiazem  180 mg Oral Daily  . metoprolol  50 mg Oral BID  . oxybutynin  5 mg Oral QHS  . pantoprazole  40 mg Oral Daily  . sodium chloride  3 mL Intravenous Q12H    Labs:  Recent Labs  08/11/13 0237  NA 140  K 3.6*  CL 104  CO2 25  GLUCOSE 97  BUN 10  CREATININE 0.69  CALCIUM 8.7    Radiology/Studies: No results found.  Telemetry: A paced V sensed; no AFib   Assessment and Plan  1. Persistent atrial fibrillation s/p AFib ablation yesterday 2. Typical appearing atrial flutter s/p CTI ablation and multiple atypical atrial flutter circuits s/p successful DCCV  Hailey [name] is doing well post ablation. Her nausea has resolved which she states she has had previously with anesthesia. She remains in SR. Will ambulate and plan for home later this morning. DC instructions  and follow-up reviewed.  Signed, Hailey Bernabe PA-C

## 2013-08-11 NOTE — Progress Notes (Signed)
Some fullness and tenderness at right groin Some chest discomfort, relieved by sitting up, no rub and no dyspnea  D.c to home To call for further groin swelling or and tenderness, increasing chest pain or dyspnea

## 2013-08-11 NOTE — Discharge Instructions (Signed)
No driving for 3 days. No lifting over 5 lbs for 1 week. No sexual activity for 1 week. Keep procedure site clean & dry. If you notice increased pain, swelling, bleeding or pus, call/return! You may shower, but no soaking baths/hot tubs/pools for 1 week. ° °

## 2013-08-24 ENCOUNTER — Other Ambulatory Visit: Payer: Self-pay | Admitting: Cardiovascular Disease

## 2013-08-24 ENCOUNTER — Encounter: Payer: Self-pay | Admitting: *Deleted

## 2013-08-25 NOTE — Telephone Encounter (Signed)
Rx was sent to pharmacy electronically. 

## 2013-08-26 ENCOUNTER — Encounter: Payer: Self-pay | Admitting: Cardiovascular Disease

## 2013-08-26 ENCOUNTER — Ambulatory Visit (INDEPENDENT_AMBULATORY_CARE_PROVIDER_SITE_OTHER): Payer: Medicare Other | Admitting: Cardiovascular Disease

## 2013-08-26 VITALS — BP 120/58 | HR 60 | Resp 16 | Ht 59.0 in | Wt 148.7 lb

## 2013-08-26 DIAGNOSIS — I2510 Atherosclerotic heart disease of native coronary artery without angina pectoris: Secondary | ICD-10-CM

## 2013-08-26 DIAGNOSIS — Z950 Presence of cardiac pacemaker: Secondary | ICD-10-CM | POA: Diagnosis not present

## 2013-08-26 DIAGNOSIS — I5033 Acute on chronic diastolic (congestive) heart failure: Secondary | ICD-10-CM | POA: Diagnosis not present

## 2013-08-26 DIAGNOSIS — I509 Heart failure, unspecified: Secondary | ICD-10-CM

## 2013-08-26 DIAGNOSIS — I4891 Unspecified atrial fibrillation: Secondary | ICD-10-CM

## 2013-08-26 LAB — PACEMAKER DEVICE OBSERVATION

## 2013-08-26 NOTE — Patient Instructions (Signed)
Your physician recommends that you schedule a follow-up appointment in: 5 Months  

## 2013-08-28 DIAGNOSIS — I2510 Atherosclerotic heart disease of native coronary artery without angina pectoris: Secondary | ICD-10-CM | POA: Insufficient documentation

## 2013-08-28 DIAGNOSIS — I25119 Atherosclerotic heart disease of native coronary artery with unspecified angina pectoris: Secondary | ICD-10-CM | POA: Insufficient documentation

## 2013-08-28 NOTE — Assessment & Plan Note (Signed)
Asymptomatic. Lipid profile and other risk factors are well controlled.

## 2013-08-28 NOTE — Assessment & Plan Note (Signed)
Functional class I. Appears clinically euvolemic on a low dose of loop diuretics. Preserved left ventricular systolic function. Avoid treatment with Actos, class I antiarrhythmics and Multaq.

## 2013-08-28 NOTE — Assessment & Plan Note (Signed)
The reduction in overall arrhythmia burden and her improve symptoms are encouraging, but obviously it is too soon to claim success. Tikosyn remains an option for adjuvant antiarrhythmic therapy if necessary

## 2013-08-28 NOTE — Assessment & Plan Note (Signed)
Atrial pacing dependent but with intact AV conduction. Normal device function. No permanent reprogramming changes performed. Remote monitoring every 3 months.

## 2013-08-28 NOTE — Progress Notes (Signed)
Patient ID: Hailey [name], female   DOB: May 07, 1946, 68 y.o.   MRN: [id number]      Reason for office visit Atrial fibrillation, pacemaker check, coronary artery disease, hyperlipidemia, hypertension   Hailey [name] underwent a repeat atrial fibrillation ablation procedure on March 9. This was not associated with any complications. She feels "different" than after the first ablation procedure and is very hopeful that this time will be much more successful. Since the procedure her pacemaker has recorded a burden of atrial fibrillation of only 5% and most of this was a 19 hour episode that occurred on March 12 just 3 days after the ablation. The peak ventricular rate was 112 beats per minute the average ventricular rate being well controlled. Only one other episode of months which was recorded but was very brief.  She does not have any atrial activity today when pacing is taken all the way down to 30 beats per minute and feels very poorly when we look for P waves (mostly nausea). She is therefore virtually 100% atrial pacing. There is only 12% atrial pacing. Her device is a [employer]. Jude dual-chamber RF pacemaker. It was implanted in 2012 is a roughly 7 years of estimated longevity.  She also has coronary disease with a roughly 70% stenosis in the right coronary artery by angiography in 2012, that is not associated with ischemia on perfusion imaging and has been left for medical therapy. Last year she was diagnosed with diastolic heart failure with moderate pulmonary hypertension. The decompensation occurred during treatment with Multaq. She is intolerant to statins has a very good lipid profile on treatment with the resin Welchol. She takes oral antidiabetic for diabetes mellitus with excellent glycemic control   Allergies  Allergen Reactions  . Codeine Nausea And Vomiting  . Demerol Nausea And Vomiting  . Ketorolac Other (See Comments)    "pt told to never take this med"  . Multaq [Dronedarone]    Fluid retention    Current Outpatient Prescriptions  Medication Sig Dispense Refill  . CALCIUM CARBONATE PO Take 600 mg by mouth daily.      . Cholecalciferol (VITAMIN D3) 1000 UNITS CAPS Take 1 capsule by mouth daily.       . colesevelam (WELCHOL) 625 MG tablet Take 1,250 mg by mouth 2 (two) times daily as needed (based on eating habits).       . dabigatran (PRADAXA) 150 MG CAPS capsule Take 150 mg by mouth 2 (two) times daily.      Marland Kitchen diltiazem (DILACOR XR) 180 MG 24 hr capsule TAKE ONE CAPSULE BY MOUTH ONCE DAILY  30 capsule  8  . furosemide (LASIX) 40 MG tablet Take 40 mg by mouth daily.      Marland Kitchen loperamide (IMODIUM A-D) 2 MG tablet Take 2 mg by mouth 2 (two) times daily as needed for diarrhea or loose stools.       Marland Kitchen loratadine (ALLERGY RELIEF) 10 MG tablet Take 10 mg by mouth daily.      . metoprolol (LOPRESSOR) 50 MG tablet Take 50 mg by mouth 2 (two) times daily.      Marland Kitchen oxybutynin (DITROPAN-XL) 5 MG 24 hr tablet Take 5 mg by mouth at bedtime.      . pantoprazole (PROTONIX) 40 MG tablet Take 40 mg by mouth daily.      Marland Kitchen propylthiouracil (PTU) 50 MG tablet Take 50-100 mg by mouth daily. Takes 2 tablets every morning and 1 tablet every afternoon.      Marland Kitchen  saxagliptin HCl (ONGLYZA) 5 MG TABS tablet Take 5 mg by mouth daily.      . valsartan (DIOVAN) 80 MG tablet Take 80 mg by mouth daily.       No current facility-administered medications for this visit.    Past Medical History  Diagnosis Date  . Hypertension   . Paroxysmal atrial fibrillation   . Diabetes mellitus   . Allergic rhinitis   . DJD (degenerative joint disease)   . Brady-tachy syndrome     s/p STJ dual chamber PPM by Dr Sallyanne Kuster  . CAD (coronary artery disease)     70% RCA stenosis, treated medically  . Polio     as a child  . Hyperthyroidism   . Acute on chronic diastolic CHF (congestive heart failure), NYHA class 1 04/29/2013    Past Surgical History  Procedure Laterality Date  . Cholecystectomy  1994  . Total  abdominal hysterectomy  1981  . Pacemaker insertion  12/03/10    SJM Accent DR RF implanted by Dr Sallyanne Kuster  . Arm surgery d/t fx Right 96  . Tee without cardioversion N/A 07/30/2012    Procedure: TRANSESOPHAGEAL ECHOCARDIOGRAM (TEE);  Surgeon: Sanda Klein, MD;  Location: [facility] ENDOSCOPY;  Service: Cardiovascular;  Laterality: N/A;  h&p in file-[facility]  . Atrial fibrillation ablation  07/31/2012; 08-10-13    PVI by Dr Rayann Heman  . Lumbar laminectomy/decompression microdiscectomy Right 12/23/2012    Procedure: Right L5-S1 Laminotomy for resection of synovial cyst;  Surgeon: Hosie Spangle, MD;  Location: [facility]URO ORS;  Service: Neurosurgery;  Laterality: Right;  Right Lumbar five-sacral one Laminotomy for resection of synovial cyst  . Tee without cardioversion N/A 08/09/2013    Procedure: TRANSESOPHAGEAL ECHOCARDIOGRAM (TEE);  Surgeon: Lelon Perla, MD;  Location: [facility] ENDOSCOPY;  Service: Cardiovascular;  Laterality: N/A;  . Cardiac catheterization  12/03/2010    single vessel mid RCA  . Nm myocar perf wall motion  12/20/2010    normal    Family History  Problem Relation Age of Onset  . Heart disease Sister   . Breast cancer Sister   . Breast cancer Sister     History   Social History  . Marital Status: Married    Spouse Name: [name]    Number of Children: N/A  . Years of Education: N/A   Occupational History  . housewife    Social History Main Topics  . Smoking status: Never Smoker   . Smokeless tobacco: Never Used  . Alcohol Use: No  . Drug Use: No  . Sexual Activity: Not Currently    Birth Control/ Protection: Abstinence   Other Topics Concern  . Not on file   Social History Narrative   Lives in [place].  Retired.    Review of systems: The patient specifically denies any chest pain at rest or with exertion, dyspnea at rest or with exertion, orthopnea, paroxysmal nocturnal dyspnea, syncope, palpitations, focal neurological deficits, intermittent claudication, lower  extremity edema, unexplained weight gain, cough, hemoptysis or wheezing.  The patient also denies abdominal pain, nausea, vomiting, dysphagia, diarrhea, constipation, polyuria, polydipsia, dysuria, hematuria, frequency, urgency, abnormal bleeding or bruising, fever, chills, unexpected weight changes, mood swings, change in skin or hair texture, change in voice quality, auditory or visual problems, allergic reactions or rashes, new musculoskeletal complaints other than usual "aches and pains".   PHYSICAL EXAM BP 120/58  Pulse 60  Resp 16  Ht 4\' 11"  (1.499 m)  Wt 67.45 kg (148 lb 11.2 oz)  BMI 30.02 kg/m2  General: Alert, oriented x3, no distress Head: no evidence of trauma, PERRL, EOMI, no exophtalmos or lid lag, no myxedema, no xanthelasma; normal ears, nose and oropharynx Neck: normal jugular venous pulsations and no hepatojugular reflux; brisk carotid pulses without delay and no carotid bruits Chest: clear to auscultation, no signs of consolidation by percussion or palpation, normal fremitus, symmetrical and full respiratory excursions, healthy subclavian pacemaker site Cardiovascular: normal position and quality of the apical impulse, regular rhythm, normal first and second heart sounds, no murmurs, rubs or gallops Abdomen: no tenderness or distention, no masses by palpation, no abnormal pulsatility or arterial bruits, normal bowel sounds, no hepatosplenomegaly Extremities: no clubbing, cyanosis or edema; 2+ radial, ulnar and brachial pulses bilaterally; 2+ right femoral, posterior tibial and dorsalis pedis pulses; 2+ left femoral, posterior tibial and dorsalis pedis pulses; no subclavian or femoral bruits Neurological: grossly nonfocal   EKG: Atrial paced ventricular sensed, RSR prime in V1, no repolarization changes  Lipid Panel    Component Value Date/Time   CHOL 94 11/24/2010 0837   TRIG 51 11/24/2010 0837   HDL 53 11/24/2010 0837   CHOLHDL 1.8 11/24/2010 0837   VLDL 10 11/24/2010  0837   LDLCALC 31 11/24/2010 0837    BMET    Component Value Date/Time   NA 140 08/11/2013 0237   K 3.6* 08/11/2013 0237   CL 104 08/11/2013 0237   CO2 25 08/11/2013 0237   GLUCOSE 97 08/11/2013 0237   BUN 10 08/11/2013 0237   CREATININE 0.69 08/11/2013 0237   CALCIUM 8.7 08/11/2013 0237   GFRNONAA 87* 08/11/2013 0237   GFRAA >90 08/11/2013 0237     ASSESSMENT AND PLAN Acute on chronic diastolic CHF (congestive heart failure), NYHA class 1 Functional class I. Appears clinically euvolemic on a low dose of loop diuretics. Preserved left ventricular systolic function. Avoid treatment with Actos, class I antiarrhythmics and Multaq.  CAD (coronary artery disease) Asymptomatic. Lipid profile and other risk factors are well controlled.  Atrial fibrillation The reduction in overall arrhythmia burden and her improve symptoms are encouraging, but obviously it is too soon to claim success. Tikosyn remains an option for adjuvant antiarrhythmic therapy if necessary  Pacemaker St. Jude accent DR RF , July 2012 Atrial pacing dependent but with intact AV conduction. Normal device function. No permanent reprogramming changes performed. Remote monitoring every 3 months.   Orders Placed This Encounter  Procedures  . EKG 12-Lead   No orders of the defined types were placed in this encounter.    Holli Humbles, MD, [place] [phone] office [phone] pager

## 2013-08-30 LAB — MDC_IDC_ENUM_SESS_TYPE_INCLINIC
Implantable Pulse Generator Model: 2210
Lead Channel Impedance Value: 380 Ohm
Lead Channel Impedance Value: 410 Ohm
Lead Channel Pacing Threshold Amplitude: 0.625 V
Lead Channel Pacing Threshold Amplitude: 0.75 V
Lead Channel Pacing Threshold Pulse Width: 0.4 ms
Lead Channel Pacing Threshold Pulse Width: 0.4 ms
Lead Channel Sensing Intrinsic Amplitude: 8.4 mV
Lead Channel Setting Pacing Amplitude: 1 V
Lead Channel Setting Sensing Sensitivity: 2 mV
MDC IDC MSMT BATTERY VOLTAGE: 2.95 V
MDC IDC PG SERIAL: 7255453
MDC IDC SET LEADCHNL RA PACING AMPLITUDE: 1.75 V
MDC IDC SET LEADCHNL RV PACING PULSEWIDTH: 0.4 ms
MDC IDC STAT BRADY RA PERCENT PACED: 90 %
MDC IDC STAT BRADY RV PERCENT PACED: 12 %

## 2013-09-09 DIAGNOSIS — Z79899 Other long term (current) drug therapy: Secondary | ICD-10-CM | POA: Diagnosis not present

## 2013-09-09 DIAGNOSIS — E0590 Thyrotoxicosis, unspecified without thyrotoxic crisis or storm: Secondary | ICD-10-CM | POA: Diagnosis not present

## 2013-09-09 DIAGNOSIS — E119 Type 2 diabetes mellitus without complications: Secondary | ICD-10-CM | POA: Diagnosis not present

## 2013-09-09 DIAGNOSIS — I10 Essential (primary) hypertension: Secondary | ICD-10-CM | POA: Diagnosis not present

## 2013-09-16 DIAGNOSIS — E119 Type 2 diabetes mellitus without complications: Secondary | ICD-10-CM | POA: Diagnosis not present

## 2013-09-16 DIAGNOSIS — I2510 Atherosclerotic heart disease of native coronary artery without angina pectoris: Secondary | ICD-10-CM | POA: Diagnosis not present

## 2013-09-16 DIAGNOSIS — E789 Disorder of lipoprotein metabolism, unspecified: Secondary | ICD-10-CM | POA: Diagnosis not present

## 2013-09-30 ENCOUNTER — Other Ambulatory Visit: Payer: Self-pay

## 2013-09-30 DIAGNOSIS — L821 Other seborrheic keratosis: Secondary | ICD-10-CM | POA: Diagnosis not present

## 2013-09-30 DIAGNOSIS — L819 Disorder of pigmentation, unspecified: Secondary | ICD-10-CM | POA: Diagnosis not present

## 2013-09-30 DIAGNOSIS — D235 Other benign neoplasm of skin of trunk: Secondary | ICD-10-CM | POA: Diagnosis not present

## 2013-09-30 DIAGNOSIS — D485 Neoplasm of uncertain behavior of skin: Secondary | ICD-10-CM | POA: Diagnosis not present

## 2013-09-30 DIAGNOSIS — D1801 Hemangioma of skin and subcutaneous tissue: Secondary | ICD-10-CM | POA: Diagnosis not present

## 2013-10-06 DIAGNOSIS — I10 Essential (primary) hypertension: Secondary | ICD-10-CM | POA: Diagnosis not present

## 2013-10-06 DIAGNOSIS — E119 Type 2 diabetes mellitus without complications: Secondary | ICD-10-CM | POA: Diagnosis not present

## 2013-10-08 DIAGNOSIS — E119 Type 2 diabetes mellitus without complications: Secondary | ICD-10-CM | POA: Diagnosis not present

## 2013-10-08 DIAGNOSIS — E0590 Thyrotoxicosis, unspecified without thyrotoxic crisis or storm: Secondary | ICD-10-CM | POA: Diagnosis not present

## 2013-10-08 DIAGNOSIS — I4891 Unspecified atrial fibrillation: Secondary | ICD-10-CM | POA: Diagnosis not present

## 2013-10-08 DIAGNOSIS — I10 Essential (primary) hypertension: Secondary | ICD-10-CM | POA: Diagnosis not present

## 2013-10-13 ENCOUNTER — Other Ambulatory Visit: Payer: Self-pay | Admitting: Cardiovascular Disease

## 2013-10-14 NOTE — Telephone Encounter (Signed)
Rx was sent to pharmacy electronically. 

## 2013-10-27 DIAGNOSIS — J329 Chronic sinusitis, unspecified: Secondary | ICD-10-CM | POA: Diagnosis not present

## 2013-11-03 ENCOUNTER — Other Ambulatory Visit: Payer: Self-pay | Admitting: Cardiovascular Disease

## 2013-11-10 ENCOUNTER — Encounter: Payer: Medicare Other | Admitting: Internal Medicine

## 2013-12-09 ENCOUNTER — Encounter: Payer: Self-pay | Admitting: Internal Medicine

## 2013-12-09 ENCOUNTER — Ambulatory Visit (INDEPENDENT_AMBULATORY_CARE_PROVIDER_SITE_OTHER): Payer: Medicare Other | Admitting: Internal Medicine

## 2013-12-09 VITALS — BP 126/71 | HR 66 | Ht 59.0 in | Wt 143.0 lb

## 2013-12-09 DIAGNOSIS — Z950 Presence of cardiac pacemaker: Secondary | ICD-10-CM

## 2013-12-09 DIAGNOSIS — I498 Other specified cardiac arrhythmias: Secondary | ICD-10-CM | POA: Diagnosis not present

## 2013-12-09 DIAGNOSIS — I480 Paroxysmal atrial fibrillation: Secondary | ICD-10-CM

## 2013-12-09 DIAGNOSIS — R001 Bradycardia, unspecified: Secondary | ICD-10-CM

## 2013-12-09 DIAGNOSIS — I4891 Unspecified atrial fibrillation: Secondary | ICD-10-CM | POA: Diagnosis not present

## 2013-12-09 DIAGNOSIS — I2510 Atherosclerotic heart disease of native coronary artery without angina pectoris: Secondary | ICD-10-CM

## 2013-12-09 LAB — MDC_IDC_ENUM_SESS_TYPE_INCLINIC
Battery Remaining Longevity: 93.6 mo
Battery Voltage: 2.93 V
Brady Statistic RA Percent Paced: 87 %
Brady Statistic RV Percent Paced: 20 %
Lead Channel Impedance Value: 375 Ohm
Lead Channel Impedance Value: 462.5 Ohm
Lead Channel Pacing Threshold Amplitude: 0.625 V
Lead Channel Pacing Threshold Amplitude: 0.875 V
Lead Channel Sensing Intrinsic Amplitude: 2.5 mV
Lead Channel Setting Pacing Amplitude: 1.125
Lead Channel Setting Pacing Pulse Width: 0.4 ms
Lead Channel Setting Sensing Sensitivity: 2 mV
MDC IDC MSMT LEADCHNL RA PACING THRESHOLD PULSEWIDTH: 0.4 ms
MDC IDC MSMT LEADCHNL RV PACING THRESHOLD PULSEWIDTH: 0.4 ms
MDC IDC MSMT LEADCHNL RV SENSING INTR AMPL: 7.7 mV
MDC IDC PG SERIAL: 7255453
MDC IDC SESS DTM: 20150709153013
MDC IDC SET LEADCHNL RA PACING AMPLITUDE: 1.625

## 2013-12-09 NOTE — Progress Notes (Signed)
PCP: Jani Gravel, MD Primary Cardiologist:  Dr Sallyanne Kuster  Hailey [name] is a 68 y.o. female who presents today for routine electrophysiology followup.  Since her afib ablation, the patient reports doing reasonably well.  She is really unaware of her afib and reports that she feels "great" since her ablation.  She continues to have chronic SOB with moderate activity.  PPM interrogation today reveals blunted histogram response. Today, she denies symptoms of chest pain, shortness of breath,  lower extremity edema, dizziness, presyncope, or syncope.  The patient is otherwise without complaint today.   Past Medical History  Diagnosis Date  . Hypertension   . Paroxysmal atrial fibrillation   . Diabetes mellitus   . Allergic rhinitis   . DJD (degenerative joint disease)   . Brady-tachy syndrome     s/p STJ dual chamber PPM by Dr Sallyanne Kuster  . CAD (coronary artery disease)     70% RCA stenosis, treated medically  . Polio     as a child  . Hyperthyroidism   . Acute on chronic diastolic CHF (congestive heart failure), NYHA class 1 04/29/2013   Past Surgical History  Procedure Laterality Date  . Cholecystectomy  1994  . Total abdominal hysterectomy  1981  . Pacemaker insertion  12/03/10    SJM Accent DR RF implanted by Dr Sallyanne Kuster  . Arm surgery d/t fx Right 96  . Tee without cardioversion N/A 07/30/2012    Procedure: TRANSESOPHAGEAL ECHOCARDIOGRAM (TEE);  Surgeon: Sanda Klein, MD;  Location: [facility] ENDOSCOPY;  Service: Cardiovascular;  Laterality: N/A;  h&p in file-[facility]  . Atrial fibrillation ablation  07/31/2012; 08-10-13    PVI by Dr Rayann Heman  . Lumbar laminectomy/decompression microdiscectomy Right 12/23/2012    Procedure: Right L5-S1 Laminotomy for resection of synovial cyst;  Surgeon: Hosie Spangle, MD;  Location: [place] NEURO ORS;  Service: Neurosurgery;  Laterality: Right;  Right Lumbar five-sacral one Laminotomy for resection of synovial cyst  . Tee without cardioversion N/A 08/09/2013   Procedure: TRANSESOPHAGEAL ECHOCARDIOGRAM (TEE);  Surgeon: Lelon Perla, MD;  Location: [facility] ENDOSCOPY;  Service: Cardiovascular;  Laterality: N/A;  . Cardiac catheterization  12/03/2010    single vessel mid RCA  . Nm myocar perf wall motion  12/20/2010    normal    Current Outpatient Prescriptions  Medication Sig Dispense Refill  . CALCIUM CARBONATE PO Take 600 mg by mouth daily.      . Cholecalciferol (VITAMIN D3) 1000 UNITS CAPS Take 1 capsule by mouth daily.       . colesevelam (WELCHOL) 625 MG tablet Take 1,250 mg by mouth 2 (two) times daily as needed (based on eating habits).       . Cyanocobalamin (VITAMIN B12 PO) (300 MG) Dissolve one under the tongue each morning      . dabigatran (PRADAXA) 150 MG CAPS capsule Take 150 mg by mouth 2 (two) times daily.      Marland Kitchen diltiazem (DILACOR XR) 180 MG 24 hr capsule TAKE ONE CAPSULE BY MOUTH ONCE DAILY  30 capsule  8  . furosemide (LASIX) 40 MG tablet Take 40 mg by mouth daily.      Marland Kitchen loperamide (IMODIUM A-D) 2 MG tablet Take 2 mg by mouth 2 (two) times daily as needed for diarrhea or loose stools.       Marland Kitchen loratadine (ALLERGY RELIEF) 10 MG tablet Take 10 mg by mouth daily.      . metoprolol (LOPRESSOR) 50 MG tablet Take 50 mg by mouth 2 (two) times  daily.      . oxybutynin (DITROPAN-XL) 5 MG 24 hr tablet Take 5 mg by mouth every morning.       . pantoprazole (PROTONIX) 40 MG tablet Take 40 mg by mouth daily.      Marland Kitchen propylthiouracil (PTU) 50 MG tablet Take 50-100 mg by mouth daily. Takes 2 tablets every morning and 1 tablet every afternoon.      . saxagliptin HCl (ONGLYZA) 5 MG TABS tablet Take 5 mg by mouth daily.      . valsartan (DIOVAN) 80 MG tablet Take 80 mg by mouth every evening.        No current facility-administered medications for this visit.    Physical Exam: Filed Vitals:   12/09/13 1121  BP: 126/71  Pulse: 66  Height: 4\' 11"  (1.499 m)  Weight: 143 lb (64.864 kg)    GEN- The patient is well appearing, alert and oriented x  3 today.   Head- normocephalic, atraumatic Eyes-  Sclera clear, conjunctiva pink Ears- hearing intact Oropharynx- clear Lungs- Clear to ausculation bilaterally, normal work of breathing Heart- Regular rate and rhythm, no murmurs, rubs or gallops, PMI not laterally displaced GI- soft, NT, ND, + BS Extremities- no clubbing, cyanosis, or edema  Assessment and Plan:  1. afib afib burden by PPM interrogation today is 11%.  She is just now at the end of her 3 month healing phase.  She is doing quite well .  I would therefore not recommend any changes to her afib management at this time.  We will continue to monitor her afib burden going forward.    2. Tachy/brady Normal pacemaker function See Pace Art report Rate response is adjusted today to promote adequate increase in atrial pacing rate with exertion  Merlin She will follow-up with me in 3 months and Dr Sallyanne Kuster as scheduled.

## 2013-12-09 NOTE — Patient Instructions (Signed)
Your physician recommends that you continue on your current medications as directed. Please refer to the Current Medication list given to you today.  Your physician recommends that you schedule a follow-up appointment in: 3 MONTHS WITH DR. ALLRED.  

## 2013-12-16 ENCOUNTER — Encounter: Payer: Self-pay | Admitting: Cardiovascular Disease

## 2013-12-23 DIAGNOSIS — Z1289 Encounter for screening for malignant neoplasm of other sites: Secondary | ICD-10-CM | POA: Diagnosis not present

## 2013-12-23 DIAGNOSIS — Z1231 Encounter for screening mammogram for malignant neoplasm of breast: Secondary | ICD-10-CM | POA: Diagnosis not present

## 2013-12-23 DIAGNOSIS — Z1212 Encounter for screening for malignant neoplasm of rectum: Secondary | ICD-10-CM | POA: Diagnosis not present

## 2013-12-30 ENCOUNTER — Other Ambulatory Visit (HOSPITAL_COMMUNITY): Payer: Self-pay | Admitting: Gynecology

## 2013-12-30 DIAGNOSIS — E2839 Other primary ovarian failure: Secondary | ICD-10-CM

## 2013-12-30 DIAGNOSIS — L905 Scar conditions and fibrosis of skin: Secondary | ICD-10-CM | POA: Diagnosis not present

## 2014-01-12 DIAGNOSIS — M545 Low back pain, unspecified: Secondary | ICD-10-CM | POA: Diagnosis not present

## 2014-01-12 DIAGNOSIS — M47817 Spondylosis without myelopathy or radiculopathy, lumbosacral region: Secondary | ICD-10-CM | POA: Diagnosis not present

## 2014-01-12 DIAGNOSIS — K5900 Constipation, unspecified: Secondary | ICD-10-CM | POA: Diagnosis not present

## 2014-01-12 DIAGNOSIS — M4120 Other idiopathic scoliosis, site unspecified: Secondary | ICD-10-CM | POA: Diagnosis not present

## 2014-01-12 DIAGNOSIS — R1032 Left lower quadrant pain: Secondary | ICD-10-CM | POA: Diagnosis not present

## 2014-01-18 DIAGNOSIS — M549 Dorsalgia, unspecified: Secondary | ICD-10-CM | POA: Diagnosis not present

## 2014-01-19 ENCOUNTER — Ambulatory Visit (HOSPITAL_COMMUNITY)
Admission: RE | Admit: 2014-01-19 | Discharge: 2014-01-19 | Disposition: A | Discharge status: Home / Self Care | Payer: Medicare Other | Source: Ambulatory Visit | Attending: Gynecology | Admitting: Gynecology

## 2014-01-19 DIAGNOSIS — M899 Disorder of bone, unspecified: Secondary | ICD-10-CM | POA: Diagnosis not present

## 2014-01-19 DIAGNOSIS — E2839 Other primary ovarian failure: Secondary | ICD-10-CM | POA: Diagnosis not present

## 2014-01-19 DIAGNOSIS — Z780 Asymptomatic menopausal state: Secondary | ICD-10-CM | POA: Diagnosis not present

## 2014-01-19 DIAGNOSIS — M949 Disorder of cartilage, unspecified: Secondary | ICD-10-CM | POA: Diagnosis not present

## 2014-01-26 ENCOUNTER — Other Ambulatory Visit: Payer: Self-pay | Admitting: Internal Medicine

## 2014-01-26 DIAGNOSIS — M549 Dorsalgia, unspecified: Secondary | ICD-10-CM

## 2014-01-27 ENCOUNTER — Ambulatory Visit
Admission: RE | Admit: 2014-01-27 | Discharge: 2014-01-27 | Disposition: A | Discharge status: Home / Self Care | Payer: Medicare Other | Source: Ambulatory Visit | Attending: Internal Medicine | Admitting: Internal Medicine

## 2014-01-27 DIAGNOSIS — M545 Low back pain, unspecified: Secondary | ICD-10-CM | POA: Diagnosis not present

## 2014-01-27 DIAGNOSIS — M549 Dorsalgia, unspecified: Secondary | ICD-10-CM

## 2014-02-02 ENCOUNTER — Other Ambulatory Visit: Payer: Self-pay | Admitting: Cardiovascular Disease

## 2014-02-02 NOTE — Telephone Encounter (Signed)
Rx was sent to pharmacy electronically. 

## 2014-02-03 DIAGNOSIS — M545 Low back pain, unspecified: Secondary | ICD-10-CM | POA: Diagnosis not present

## 2014-02-09 DIAGNOSIS — M546 Pain in thoracic spine: Secondary | ICD-10-CM | POA: Diagnosis not present

## 2014-02-09 DIAGNOSIS — M47817 Spondylosis without myelopathy or radiculopathy, lumbosacral region: Secondary | ICD-10-CM | POA: Diagnosis not present

## 2014-02-09 DIAGNOSIS — M5137 Other intervertebral disc degeneration, lumbosacral region: Secondary | ICD-10-CM | POA: Diagnosis not present

## 2014-02-09 DIAGNOSIS — M545 Low back pain, unspecified: Secondary | ICD-10-CM | POA: Diagnosis not present

## 2014-02-09 DIAGNOSIS — IMO0002 Reserved for concepts with insufficient information to code with codable children: Secondary | ICD-10-CM | POA: Diagnosis not present

## 2014-02-09 DIAGNOSIS — M47814 Spondylosis without myelopathy or radiculopathy, thoracic region: Secondary | ICD-10-CM | POA: Diagnosis not present

## 2014-02-10 ENCOUNTER — Telehealth: Payer: Self-pay | Admitting: Cardiovascular Disease

## 2014-02-10 NOTE — Telephone Encounter (Signed)
Confirmed with pt that she can hook monitor and move it to another room. Pt informed to send a manual transmission when monitor is hooked back up. She verbalized understanding.

## 2014-02-10 NOTE — Telephone Encounter (Signed)
New message     Pt is painting in the room her monitor is plugged in.  Can they unplug the monitor for approx 3 days to paint?

## 2014-02-14 ENCOUNTER — Other Ambulatory Visit: Payer: Self-pay | Admitting: Neurosurgery

## 2014-02-14 DIAGNOSIS — M47816 Spondylosis without myelopathy or radiculopathy, lumbar region: Secondary | ICD-10-CM

## 2014-02-14 DIAGNOSIS — M5414 Radiculopathy, thoracic region: Secondary | ICD-10-CM

## 2014-02-22 ENCOUNTER — Ambulatory Visit
Admission: RE | Admit: 2014-02-22 | Discharge: 2014-02-22 | Disposition: A | Discharge status: Home / Self Care | Payer: Medicare Other | Source: Ambulatory Visit | Attending: Neurosurgery | Admitting: Neurosurgery

## 2014-02-22 VITALS — BP 144/68 | HR 84

## 2014-02-22 DIAGNOSIS — M47816 Spondylosis without myelopathy or radiculopathy, lumbar region: Secondary | ICD-10-CM

## 2014-02-22 DIAGNOSIS — M5414 Radiculopathy, thoracic region: Secondary | ICD-10-CM

## 2014-02-22 DIAGNOSIS — M545 Low back pain, unspecified: Secondary | ICD-10-CM | POA: Diagnosis not present

## 2014-02-22 DIAGNOSIS — M546 Pain in thoracic spine: Secondary | ICD-10-CM | POA: Diagnosis not present

## 2014-02-22 DIAGNOSIS — M47814 Spondylosis without myelopathy or radiculopathy, thoracic region: Secondary | ICD-10-CM | POA: Diagnosis not present

## 2014-02-22 DIAGNOSIS — M5124 Other intervertebral disc displacement, thoracic region: Secondary | ICD-10-CM | POA: Diagnosis not present

## 2014-02-22 DIAGNOSIS — M47817 Spondylosis without myelopathy or radiculopathy, lumbosacral region: Secondary | ICD-10-CM | POA: Diagnosis not present

## 2014-02-22 DIAGNOSIS — M4120 Other idiopathic scoliosis, site unspecified: Secondary | ICD-10-CM | POA: Diagnosis not present

## 2014-02-22 DIAGNOSIS — M5126 Other intervertebral disc displacement, lumbar region: Secondary | ICD-10-CM | POA: Diagnosis not present

## 2014-02-22 MED ORDER — IOHEXOL 300 MG/ML  SOLN
10.0000 mL | Freq: Once | INTRAMUSCULAR | Status: AC | PRN
Start: 2014-02-22 — End: 2014-02-22
  Administered 2014-02-22: 10 mL via INTRATHECAL

## 2014-02-22 MED ORDER — DIAZEPAM 5 MG PO TABS
5.0000 mg | ORAL_TABLET | Freq: Once | ORAL | Status: AC
  Administered 2014-02-22: 5 mg via ORAL

## 2014-02-22 NOTE — Progress Notes (Signed)
Patient states she has been off Pradaxa and Tramadol for at least the past two days.  Brita Romp, RN

## 2014-02-22 NOTE — Discharge Instructions (Signed)
Myelogram Discharge Instructions  1. Go home and rest quietly for the next 24 hours.  It is important to lie flat for the next 24 hours.  Get up only to go to the restroom.  You may lie in the bed or on a couch on your back, your stomach, your left side or your right side.  You may have one pillow under your head.  You may have pillows between your knees while you are on your side or under your knees while you are on your back.  2. DO NOT drive today.  Recline the seat as far back as it will go, while still wearing your seat belt, on the way home.  3. You may get up to go to the bathroom as needed.  You may sit up for 10 minutes to eat.  You may resume your normal diet and medications unless otherwise indicated.  Drink lots of extra fluids today and tomorrow.  4. The incidence of headache, nausea, or vomiting is about 5% (one in 20 patients).  If you develop a headache, lie flat and drink plenty of fluids until the headache goes away.  Caffeinated beverages may be helpful.  If you develop severe nausea and vomiting or a headache that does not go away with flat bed rest, call [phone].  5. You may resume normal activities after your 24 hours of bed rest is over; however, do not exert yourself strongly or do any heavy lifting tomorrow. If when you get up you have a headache when standing, go back to bed and force fluids for another 24 hours.  6. Call your physician for a follow-up appointment.  The results of your myelogram will be sent directly to your physician by the following day.  7. If you have any questions or if complications develop after you arrive home, please call [phone].  Discharge instructions have been explained to the patient.  The patient, or the person responsible for the patient, fully understands these instructions.      May resume Pradaxs today.  May resume tramadol on Sept. 23, 2015, after 1:00 pm.

## 2014-03-10 DIAGNOSIS — E119 Type 2 diabetes mellitus without complications: Secondary | ICD-10-CM | POA: Diagnosis not present

## 2014-03-10 DIAGNOSIS — N390 Urinary tract infection, site not specified: Secondary | ICD-10-CM | POA: Diagnosis not present

## 2014-03-10 DIAGNOSIS — I10 Essential (primary) hypertension: Secondary | ICD-10-CM | POA: Diagnosis not present

## 2014-03-10 DIAGNOSIS — E789 Disorder of lipoprotein metabolism, unspecified: Secondary | ICD-10-CM | POA: Diagnosis not present

## 2014-03-16 DIAGNOSIS — Z6830 Body mass index (BMI) 30.0-30.9, adult: Secondary | ICD-10-CM | POA: Diagnosis not present

## 2014-03-16 DIAGNOSIS — M5136 Other intervertebral disc degeneration, lumbar region: Secondary | ICD-10-CM | POA: Diagnosis not present

## 2014-03-16 DIAGNOSIS — M5134 Other intervertebral disc degeneration, thoracic region: Secondary | ICD-10-CM | POA: Diagnosis not present

## 2014-03-16 DIAGNOSIS — M47814 Spondylosis without myelopathy or radiculopathy, thoracic region: Secondary | ICD-10-CM | POA: Diagnosis not present

## 2014-03-16 DIAGNOSIS — M47816 Spondylosis without myelopathy or radiculopathy, lumbar region: Secondary | ICD-10-CM | POA: Diagnosis not present

## 2014-03-16 DIAGNOSIS — M545 Low back pain: Secondary | ICD-10-CM | POA: Diagnosis not present

## 2014-03-17 DIAGNOSIS — I4891 Unspecified atrial fibrillation: Secondary | ICD-10-CM | POA: Diagnosis not present

## 2014-03-17 DIAGNOSIS — Z23 Encounter for immunization: Secondary | ICD-10-CM | POA: Diagnosis not present

## 2014-03-17 DIAGNOSIS — E0590 Thyrotoxicosis, unspecified without thyrotoxic crisis or storm: Secondary | ICD-10-CM | POA: Diagnosis not present

## 2014-03-17 DIAGNOSIS — I10 Essential (primary) hypertension: Secondary | ICD-10-CM | POA: Diagnosis not present

## 2014-03-18 ENCOUNTER — Telehealth: Payer: Self-pay | Admitting: Internal Medicine

## 2014-03-18 NOTE — Telephone Encounter (Signed)
Doing well.  Since her last visit, she does not feel that she is having any afib.  She is pleased with the results of her procedure and will keep scheduled follow-up.

## 2014-03-28 ENCOUNTER — Ambulatory Visit (INDEPENDENT_AMBULATORY_CARE_PROVIDER_SITE_OTHER): Payer: Medicare Other | Admitting: Internal Medicine

## 2014-03-28 ENCOUNTER — Encounter: Payer: Self-pay | Admitting: Internal Medicine

## 2014-03-28 VITALS — BP 114/72 | HR 80 | Ht 59.0 in | Wt 140.6 lb

## 2014-03-28 DIAGNOSIS — I480 Paroxysmal atrial fibrillation: Secondary | ICD-10-CM

## 2014-03-28 DIAGNOSIS — I2510 Atherosclerotic heart disease of native coronary artery without angina pectoris: Secondary | ICD-10-CM | POA: Diagnosis not present

## 2014-03-28 DIAGNOSIS — R001 Bradycardia, unspecified: Secondary | ICD-10-CM | POA: Diagnosis not present

## 2014-03-28 LAB — MDC_IDC_ENUM_SESS_TYPE_INCLINIC
Battery Remaining Longevity: 80.4 mo
Implantable Pulse Generator Model: 2210
Lead Channel Impedance Value: 375 Ohm
Lead Channel Pacing Threshold Amplitude: 1 V
Lead Channel Pacing Threshold Pulse Width: 0.4 ms
Lead Channel Sensing Intrinsic Amplitude: 8.4 mV
Lead Channel Setting Pacing Amplitude: 1.625
Lead Channel Setting Sensing Sensitivity: 2 mV
MDC IDC MSMT BATTERY VOLTAGE: 2.93 V
MDC IDC MSMT LEADCHNL RA IMPEDANCE VALUE: 387.5 Ohm
MDC IDC MSMT LEADCHNL RA PACING THRESHOLD AMPLITUDE: 0.625 V
MDC IDC MSMT LEADCHNL RA PACING THRESHOLD PULSEWIDTH: 0.4 ms
MDC IDC PG SERIAL: 7255453
MDC IDC SESS DTM: 20151026141704
MDC IDC SET LEADCHNL RV PACING AMPLITUDE: 2.5 V
MDC IDC SET LEADCHNL RV PACING PULSEWIDTH: 0.4 ms
MDC IDC STAT BRADY RA PERCENT PACED: 83 %
MDC IDC STAT BRADY RV PERCENT PACED: 40 %

## 2014-03-28 NOTE — Progress Notes (Signed)
PCP: Jani Gravel, MD Primary Cardiologist:  Dr Sallyanne Kuster  Hailey [name] is a 68 y.o. female who presents today for routine electrophysiology followup.  Since her last visit, the patient reports doing reasonably well.  She is really unaware of her afib and reports that she feels "great" since her ablation.   Today, she denies symptoms of chest pain, shortness of breath,  lower extremity edema, dizziness, presyncope, or syncope.  The patient is otherwise without complaint today.   Past Medical History  Diagnosis Date  . Hypertension   . Paroxysmal atrial fibrillation   . Diabetes mellitus   . Allergic rhinitis   . DJD (degenerative joint disease)   . Brady-tachy syndrome     s/p STJ dual chamber PPM by Dr Sallyanne Kuster  . CAD (coronary artery disease)     70% RCA stenosis, treated medically  . Polio     as a child  . Hyperthyroidism   . Acute on chronic diastolic CHF (congestive heart failure), NYHA class 1 04/29/2013   Past Surgical History  Procedure Laterality Date  . Cholecystectomy  1994  . Total abdominal hysterectomy  1981  . Pacemaker insertion  12/03/10    SJM Accent DR RF implanted by Dr Sallyanne Kuster  . Arm surgery d/t fx Right 96  . Tee without cardioversion N/A 07/30/2012    Procedure: TRANSESOPHAGEAL ECHOCARDIOGRAM (TEE);  Surgeon: Sanda Klein, MD;  Location: [facility] ENDOSCOPY;  Service: Cardiovascular;  Laterality: N/A;  h&p in file-hope  . Atrial fibrillation ablation  07/31/2012; 08-10-13    PVI by Dr Rayann Heman  . Lumbar laminectomy/decompression microdiscectomy Right 12/23/2012    Procedure: Right L5-S1 Laminotomy for resection of synovial cyst;  Surgeon: Hosie Spangle, MD;  Location: [place] NEURO ORS;  Service: Neurosurgery;  Laterality: Right;  Right Lumbar five-sacral one Laminotomy for resection of synovial cyst  . Tee without cardioversion N/A 08/09/2013    Procedure: TRANSESOPHAGEAL ECHOCARDIOGRAM (TEE);  Surgeon: Lelon Perla, MD;  Location: [facility] ENDOSCOPY;  Service:  Cardiovascular;  Laterality: N/A;  . Cardiac catheterization  12/03/2010    single vessel mid RCA  . Nm myocar perf wall motion  12/20/2010    normal    Current Outpatient Prescriptions  Medication Sig Dispense Refill  . CALCIUM CARBONATE PO Take 600 mg by mouth daily.      . Cholecalciferol (VITAMIN D3) 1000 UNITS CAPS Take 1 capsule by mouth daily.       . dabigatran (PRADAXA) 150 MG CAPS capsule Take 150 mg by mouth 2 (two) times daily.      Marland Kitchen diltiazem (DILACOR XR) 180 MG 24 hr capsule TAKE ONE CAPSULE BY MOUTH ONCE DAILY  30 capsule  8  . furosemide (LASIX) 40 MG tablet Take 40 mg by mouth daily.      Marland Kitchen loratadine (ALLERGY RELIEF) 10 MG tablet Take 10 mg by mouth daily as needed for allergies.       . methocarbamol (ROBAXIN) 500 MG tablet Take 1 tablet by mouth daily as needed. Back pain      . metoprolol (LOPRESSOR) 50 MG tablet TAKE ONE TABLET BY MOUTH TWICE DAILY  60 tablet  6  . oxybutynin (DITROPAN-XL) 5 MG 24 hr tablet Take 5 mg by mouth every morning.       . pantoprazole (PROTONIX) 40 MG tablet Take 40 mg by mouth daily.      Marland Kitchen propylthiouracil (PTU) 50 MG tablet Take 50-100 mg by mouth daily. Takes 2 tablets every morning and 1 tablet  every night      . saxagliptin HCl (ONGLYZA) 5 MG TABS tablet Take 5 mg by mouth daily.      . traMADol (ULTRAM) 50 MG tablet Take 0.5 tablets by mouth 2 (two) times daily.      . valsartan (DIOVAN) 80 MG tablet Take 80 mg by mouth every evening.       . Cyanocobalamin (VITAMIN B12 PO) (300 MG) Dissolve one under the tongue each morning       No current facility-administered medications for this visit.    Physical Exam: Filed Vitals:   03/28/14 1359  BP: 114/72  Pulse: 80  Height: 4\' 11"  (1.499 m)  Weight: 140 lb 9.6 oz (63.776 kg)    GEN- The patient is well appearing, alert and oriented x 3 today.   Head- normocephalic, atraumatic Eyes-  Sclera clear, conjunctiva pink Ears- hearing intact Oropharynx- clear Lungs- Clear to  ausculation bilaterally, normal work of breathing Heart- Regular rate and rhythm, no murmurs, rubs or gallops, PMI not laterally displaced GI- soft, NT, ND, + BS Extremities- no clubbing, cyanosis, or edema  Assessment and Plan:  1. afib afib burden by PPM interrogation today is 16%.  She had 5 days of consecutive AF 8/31 for which she was unaware.  She is doing quite well .  I would therefore not recommend any changes to her afib management at this time.  We will continue to monitor her afib burden going forward.    2. Tachy/brady Normal pacemaker function See Pace Art report  Merlin She will follow-up with me in 6 months and Dr Sallyanne Kuster as scheduled.

## 2014-03-28 NOTE — Patient Instructions (Signed)
Your physician wants you to follow-up in: 3 months with Dr Vivia Ewing and 6 months with Dr Vallery Ridge will receive a reminder letter in the mail two months in advance. If you don't receive a letter, please call our office to schedule the follow-up appointment.

## 2014-03-29 ENCOUNTER — Ambulatory Visit: Payer: Medicare Other | Attending: Neurosurgery | Admitting: Physical Therapy

## 2014-03-29 DIAGNOSIS — E119 Type 2 diabetes mellitus without complications: Secondary | ICD-10-CM | POA: Diagnosis not present

## 2014-03-29 DIAGNOSIS — I10 Essential (primary) hypertension: Secondary | ICD-10-CM | POA: Diagnosis not present

## 2014-03-29 DIAGNOSIS — M5442 Lumbago with sciatica, left side: Secondary | ICD-10-CM | POA: Diagnosis not present

## 2014-03-29 DIAGNOSIS — Z9049 Acquired absence of other specified parts of digestive tract: Secondary | ICD-10-CM | POA: Insufficient documentation

## 2014-03-29 DIAGNOSIS — Z5189 Encounter for other specified aftercare: Secondary | ICD-10-CM | POA: Diagnosis not present

## 2014-04-01 ENCOUNTER — Ambulatory Visit: Payer: Medicare Other | Admitting: *Deleted

## 2014-04-01 DIAGNOSIS — Z5189 Encounter for other specified aftercare: Secondary | ICD-10-CM | POA: Diagnosis not present

## 2014-04-05 ENCOUNTER — Ambulatory Visit: Payer: Medicare Other | Attending: Neurosurgery | Admitting: *Deleted

## 2014-04-05 DIAGNOSIS — E119 Type 2 diabetes mellitus without complications: Secondary | ICD-10-CM | POA: Insufficient documentation

## 2014-04-05 DIAGNOSIS — M5442 Lumbago with sciatica, left side: Secondary | ICD-10-CM | POA: Insufficient documentation

## 2014-04-05 DIAGNOSIS — Z5189 Encounter for other specified aftercare: Secondary | ICD-10-CM | POA: Diagnosis not present

## 2014-04-05 DIAGNOSIS — Z9049 Acquired absence of other specified parts of digestive tract: Secondary | ICD-10-CM | POA: Diagnosis not present

## 2014-04-05 DIAGNOSIS — I10 Essential (primary) hypertension: Secondary | ICD-10-CM | POA: Diagnosis not present

## 2014-04-07 ENCOUNTER — Ambulatory Visit: Payer: Medicare Other | Admitting: *Deleted

## 2014-04-07 DIAGNOSIS — Z5189 Encounter for other specified aftercare: Secondary | ICD-10-CM | POA: Diagnosis not present

## 2014-04-12 ENCOUNTER — Ambulatory Visit: Payer: Medicare Other | Admitting: *Deleted

## 2014-04-12 DIAGNOSIS — Z5189 Encounter for other specified aftercare: Secondary | ICD-10-CM | POA: Diagnosis not present

## 2014-04-13 ENCOUNTER — Ambulatory Visit: Payer: Medicare Other | Admitting: Physical Therapy

## 2014-04-13 DIAGNOSIS — Z5189 Encounter for other specified aftercare: Secondary | ICD-10-CM | POA: Diagnosis not present

## 2014-04-14 ENCOUNTER — Encounter: Payer: Medicare Other | Admitting: *Deleted

## 2014-04-14 DIAGNOSIS — I10 Essential (primary) hypertension: Secondary | ICD-10-CM | POA: Diagnosis not present

## 2014-04-14 DIAGNOSIS — E119 Type 2 diabetes mellitus without complications: Secondary | ICD-10-CM | POA: Diagnosis not present

## 2014-04-14 DIAGNOSIS — I4891 Unspecified atrial fibrillation: Secondary | ICD-10-CM | POA: Diagnosis not present

## 2014-04-14 DIAGNOSIS — E0590 Thyrotoxicosis, unspecified without thyrotoxic crisis or storm: Secondary | ICD-10-CM | POA: Diagnosis not present

## 2014-04-19 ENCOUNTER — Ambulatory Visit: Payer: Medicare Other | Admitting: *Deleted

## 2014-04-19 DIAGNOSIS — Z5189 Encounter for other specified aftercare: Secondary | ICD-10-CM | POA: Diagnosis not present

## 2014-04-21 ENCOUNTER — Ambulatory Visit: Payer: Medicare Other | Admitting: *Deleted

## 2014-04-21 DIAGNOSIS — Z5189 Encounter for other specified aftercare: Secondary | ICD-10-CM | POA: Diagnosis not present

## 2014-04-26 ENCOUNTER — Ambulatory Visit: Payer: Medicare Other | Admitting: *Deleted

## 2014-04-26 DIAGNOSIS — Z5189 Encounter for other specified aftercare: Secondary | ICD-10-CM | POA: Diagnosis not present

## 2014-05-03 ENCOUNTER — Ambulatory Visit: Payer: Medicare Other | Attending: Neurosurgery | Admitting: Physical Therapy

## 2014-05-03 DIAGNOSIS — M5442 Lumbago with sciatica, left side: Secondary | ICD-10-CM | POA: Diagnosis not present

## 2014-05-03 DIAGNOSIS — E119 Type 2 diabetes mellitus without complications: Secondary | ICD-10-CM | POA: Diagnosis not present

## 2014-05-03 DIAGNOSIS — I10 Essential (primary) hypertension: Secondary | ICD-10-CM | POA: Diagnosis not present

## 2014-05-03 DIAGNOSIS — Z9049 Acquired absence of other specified parts of digestive tract: Secondary | ICD-10-CM | POA: Diagnosis not present

## 2014-05-03 DIAGNOSIS — Z5189 Encounter for other specified aftercare: Secondary | ICD-10-CM | POA: Insufficient documentation

## 2014-05-04 ENCOUNTER — Other Ambulatory Visit: Payer: Self-pay | Admitting: Cardiovascular Disease

## 2014-05-04 NOTE — Telephone Encounter (Signed)
Rx has been sent to the pharmacy electronically. ° °

## 2014-05-05 ENCOUNTER — Ambulatory Visit: Payer: Medicare Other | Admitting: Physical Therapy

## 2014-05-05 DIAGNOSIS — Z5189 Encounter for other specified aftercare: Secondary | ICD-10-CM | POA: Diagnosis not present

## 2014-05-10 ENCOUNTER — Ambulatory Visit: Payer: Medicare Other | Admitting: *Deleted

## 2014-05-10 DIAGNOSIS — Z5189 Encounter for other specified aftercare: Secondary | ICD-10-CM | POA: Diagnosis not present

## 2014-05-12 ENCOUNTER — Encounter (HOSPITAL_COMMUNITY): Payer: Self-pay | Admitting: Internal Medicine

## 2014-05-12 ENCOUNTER — Ambulatory Visit: Payer: Medicare Other | Admitting: *Deleted

## 2014-05-12 DIAGNOSIS — Z5189 Encounter for other specified aftercare: Secondary | ICD-10-CM | POA: Diagnosis not present

## 2014-05-17 ENCOUNTER — Ambulatory Visit: Payer: Medicare Other | Admitting: Physical Therapy

## 2014-05-17 DIAGNOSIS — Z5189 Encounter for other specified aftercare: Secondary | ICD-10-CM | POA: Diagnosis not present

## 2014-05-19 ENCOUNTER — Ambulatory Visit: Payer: Medicare Other | Admitting: Physical Therapy

## 2014-05-19 DIAGNOSIS — Z5189 Encounter for other specified aftercare: Secondary | ICD-10-CM | POA: Diagnosis not present

## 2014-05-21 ENCOUNTER — Other Ambulatory Visit: Payer: Self-pay | Admitting: Cardiovascular Disease

## 2014-05-23 NOTE — Telephone Encounter (Signed)
Rx(s) sent to pharmacy electronically.  

## 2014-05-25 ENCOUNTER — Ambulatory Visit: Payer: Medicare Other | Admitting: Physical Therapy

## 2014-05-25 DIAGNOSIS — Z5189 Encounter for other specified aftercare: Secondary | ICD-10-CM | POA: Diagnosis not present

## 2014-06-10 DIAGNOSIS — E0590 Thyrotoxicosis, unspecified without thyrotoxic crisis or storm: Secondary | ICD-10-CM | POA: Diagnosis not present

## 2014-06-10 DIAGNOSIS — E0789 Other specified disorders of thyroid: Secondary | ICD-10-CM | POA: Diagnosis not present

## 2014-06-17 DIAGNOSIS — E0590 Thyrotoxicosis, unspecified without thyrotoxic crisis or storm: Secondary | ICD-10-CM | POA: Diagnosis not present

## 2014-06-17 DIAGNOSIS — E118 Type 2 diabetes mellitus with unspecified complications: Secondary | ICD-10-CM | POA: Diagnosis not present

## 2014-06-17 DIAGNOSIS — R197 Diarrhea, unspecified: Secondary | ICD-10-CM | POA: Diagnosis not present

## 2014-06-28 ENCOUNTER — Telehealth: Payer: Self-pay | Admitting: Cardiovascular Disease

## 2014-06-28 NOTE — Telephone Encounter (Signed)
Close encounter 

## 2014-06-29 ENCOUNTER — Ambulatory Visit: Payer: Medicare Other | Admitting: *Deleted

## 2014-06-29 DIAGNOSIS — E119 Type 2 diabetes mellitus without complications: Secondary | ICD-10-CM | POA: Diagnosis not present

## 2014-06-29 DIAGNOSIS — H524 Presbyopia: Secondary | ICD-10-CM | POA: Diagnosis not present

## 2014-06-29 DIAGNOSIS — H52223 Regular astigmatism, bilateral: Secondary | ICD-10-CM | POA: Diagnosis not present

## 2014-06-29 DIAGNOSIS — H5203 Hypermetropia, bilateral: Secondary | ICD-10-CM | POA: Diagnosis not present

## 2014-06-29 DIAGNOSIS — I480 Paroxysmal atrial fibrillation: Secondary | ICD-10-CM

## 2014-06-29 DIAGNOSIS — H25813 Combined forms of age-related cataract, bilateral: Secondary | ICD-10-CM | POA: Diagnosis not present

## 2014-06-29 NOTE — Progress Notes (Signed)
Remote pacemaker transmission.   

## 2014-07-11 DIAGNOSIS — E119 Type 2 diabetes mellitus without complications: Secondary | ICD-10-CM | POA: Diagnosis not present

## 2014-07-11 DIAGNOSIS — I10 Essential (primary) hypertension: Secondary | ICD-10-CM | POA: Diagnosis not present

## 2014-07-11 DIAGNOSIS — E0590 Thyrotoxicosis, unspecified without thyrotoxic crisis or storm: Secondary | ICD-10-CM | POA: Diagnosis not present

## 2014-07-14 DIAGNOSIS — I10 Essential (primary) hypertension: Secondary | ICD-10-CM | POA: Diagnosis not present

## 2014-07-14 DIAGNOSIS — E0590 Thyrotoxicosis, unspecified without thyrotoxic crisis or storm: Secondary | ICD-10-CM | POA: Diagnosis not present

## 2014-07-14 DIAGNOSIS — E118 Type 2 diabetes mellitus with unspecified complications: Secondary | ICD-10-CM | POA: Diagnosis not present

## 2014-07-19 ENCOUNTER — Encounter: Payer: Self-pay | Admitting: Cardiovascular Disease

## 2014-07-28 ENCOUNTER — Encounter: Payer: Self-pay | Admitting: Cardiovascular Disease

## 2014-07-28 ENCOUNTER — Ambulatory Visit (INDEPENDENT_AMBULATORY_CARE_PROVIDER_SITE_OTHER): Payer: Medicare Other | Admitting: Cardiovascular Disease

## 2014-07-28 VITALS — BP 116/58 | HR 76 | Resp 20 | Ht 59.0 in | Wt 144.0 lb

## 2014-07-28 DIAGNOSIS — I5032 Chronic diastolic (congestive) heart failure: Secondary | ICD-10-CM | POA: Diagnosis not present

## 2014-07-28 DIAGNOSIS — Z950 Presence of cardiac pacemaker: Secondary | ICD-10-CM | POA: Diagnosis not present

## 2014-07-28 DIAGNOSIS — I4891 Unspecified atrial fibrillation: Secondary | ICD-10-CM

## 2014-07-28 DIAGNOSIS — I2510 Atherosclerotic heart disease of native coronary artery without angina pectoris: Secondary | ICD-10-CM | POA: Diagnosis not present

## 2014-07-28 NOTE — Patient Instructions (Addendum)
Your physician has requested that you have a lexiscan myoview. For further information please visit [email or web address]. Please follow instruction sheet, as given.  Remote monitoring is used to monitor your Pacemaker or ICD from home. This monitoring reduces the number of office visits required to check your device to one time per year. It allows [place] to monitor the functioning of your device to ensure it is working properly. You are scheduled for a device check from home on Oct 27, 2014. You may send your transmission at any time that day. If you have a wireless device, the transmission will be sent automatically. After your physician reviews your transmission, you will receive a postcard with your next transmission date.  Dr. Sallyanne Kuster recommends that you schedule a follow-up appointment in: One year.

## 2014-07-29 ENCOUNTER — Other Ambulatory Visit: Payer: Self-pay | Admitting: Cardiovascular Disease

## 2014-07-29 ENCOUNTER — Encounter: Payer: Self-pay | Admitting: Cardiovascular Disease

## 2014-07-29 LAB — MDC_IDC_ENUM_SESS_TYPE_INCLINIC
Battery Remaining Percentage: 73 %
Brady Statistic RA Percent Paced: 87 %
Brady Statistic RV Percent Paced: 43 %
Implantable Pulse Generator Model: 2210
Lead Channel Impedance Value: 400 Ohm
Lead Channel Pacing Threshold Pulse Width: 0.4 ms
Lead Channel Pacing Threshold Pulse Width: 0.4 ms
Lead Channel Sensing Intrinsic Amplitude: 2.5 mV
Lead Channel Sensing Intrinsic Amplitude: 7.6 mV
Lead Channel Setting Pacing Amplitude: 2.5 V
Lead Channel Setting Pacing Pulse Width: 0.4 ms
Lead Channel Setting Sensing Sensitivity: 2 mV
MDC IDC MSMT BATTERY VOLTAGE: 2.92 V
MDC IDC MSMT LEADCHNL RA IMPEDANCE VALUE: 400 Ohm
MDC IDC MSMT LEADCHNL RA PACING THRESHOLD AMPLITUDE: 0.75 V
MDC IDC MSMT LEADCHNL RV PACING THRESHOLD AMPLITUDE: 1 V
MDC IDC PG SERIAL: 7255453
MDC IDC SET LEADCHNL RA PACING AMPLITUDE: 1.625

## 2014-07-29 NOTE — Progress Notes (Signed)
Patient ID: Hailey [name], female   DOB: 08/21/1945, 69 y.o.   MRN: [id number]      Reason for office visit Paroxysmal atrial fibrillation, chronic diastolic heart failure, CAD  Mrs. Dorin is a 69 year old woman with paroxysmal atrial fibrillation that is now not very symptomatic but which persists with a relatively high burden (13%) despite previous ablation procedures. She has a dual-chamber permanent pacemaker (St. Jude Accent implanted 2012 for tachycardia-bradycardia syndrome and 2-1 atrioventricular block). She has known coronary artery disease with a 70% stenosis in the mid right coronary artery that did not show evidence of ischemia on nuclear perfusion imaging and was left for medical therapy. She has treated type 2 diabetes mellitus hypertension, but has a very good lipid profile without therapy. She has a history of hyperthyroidism but is currently euthyroid on propylthiouracil.  She had problems with intractable vomiting a few days ago. During that time her rhythm was normal but she did develop atrial fibrillation the day after the vomiting resolved.  Pacemaker interrogation shows normal device function. She has roughly 87% atrial pacing and 43% ventricular pacing. This is comparable to her historical trends. Battery longevity is estimated to be between 6 and 7 years. Today there were no P waves detected. As mentioned the burden of atrial fibrillation is around 13%. Ventricular rate control is generally very good with ventricular rates in the 70-90 range.  Her electrocardiogram shows prominent T-wave inversion in leads V3-V6 as well as the inferior leads. Such changes have been seen on previous tracings but were much more subtle.  Her most recent labs performed just a few days ago showed a total cholesterol of 168, triglycerides 71, HDL 83, LDL 71. Her hemoglobin A1c was 6.2%. Her TSH was 0.13 (just under normal range) and her total T4 was 11.8 (upper limit of normal) as was her T3 at  179.  She has diastolic heart failure but only decompensated when she was on treatment with dronedarone. She now has NYHA class I status. He continues to take a very low dose of loop diuretic. Allergies  Allergen Reactions  . Ketorolac Nausea And Vomiting and Other (See Comments)    "pt told to never take this med"  . Codeine Nausea And Vomiting  . Demerol Nausea And Vomiting  . Multaq [Dronedarone] Other (See Comments)    Increased fluid retention    Current Outpatient Prescriptions  Medication Sig Dispense Refill  . CALCIUM CARBONATE PO Take 600 mg by mouth daily.    . Cholecalciferol (VITAMIN D3) 1000 UNITS CAPS Take 1 capsule by mouth daily.     . Cyanocobalamin (VITAMIN B12 PO) (300 MG) Dissolve one under the tongue each morning    . diltiazem (DILACOR XR) 180 MG 24 hr capsule TAKE ONE CAPSULE BY MOUTH ONCE DAILY 30 capsule 3  . furosemide (LASIX) 40 MG tablet Take 40 mg by mouth daily.    Marland Kitchen loratadine (ALLERGY RELIEF) 10 MG tablet Take 10 mg by mouth daily as needed for allergies.     . metoprolol (LOPRESSOR) 50 MG tablet TAKE ONE TABLET BY MOUTH TWICE DAILY 60 tablet 6  . oxybutynin (DITROPAN-XL) 5 MG 24 hr tablet Take 5 mg by mouth every morning.     . pantoprazole (PROTONIX) 40 MG tablet Take 40 mg by mouth daily.    Marland Kitchen PRADAXA 150 MG CAPS capsule TAKE ONE CAPSULE BY MOUTH TWICE DAILY 60 capsule 5  . propylthiouracil (PTU) 50 MG tablet Take 50-100 mg by mouth daily. Takes  2 tablets every morning and 1 tablet every night    . saxagliptin HCl (ONGLYZA) 5 MG TABS tablet Take 5 mg by mouth daily.    . valsartan (DIOVAN) 80 MG tablet Take 80 mg by mouth every evening.      No current facility-administered medications for this visit.    Past Medical History  Diagnosis Date  . Hypertension   . Paroxysmal atrial fibrillation   . Diabetes mellitus   . Allergic rhinitis   . DJD (degenerative joint disease)   . Brady-tachy syndrome     s/p STJ dual chamber PPM by Dr Sallyanne Kuster  .  CAD (coronary artery disease)     70% RCA stenosis, treated medically  . Polio     as a child  . Hyperthyroidism   . Acute on chronic diastolic CHF (congestive heart failure), NYHA class 1 04/29/2013    Past Surgical History  Procedure Laterality Date  . Cholecystectomy  1994  . Total abdominal hysterectomy  1981  . Pacemaker insertion  12/03/10    SJM Accent DR RF implanted by Dr Sallyanne Kuster  . Arm surgery d/t fx Right 96  . Tee without cardioversion N/A 07/30/2012    Procedure: TRANSESOPHAGEAL ECHOCARDIOGRAM (TEE);  Surgeon: Sanda Klein, MD;  Location: [facility] ENDOSCOPY;  Service: Cardiovascular;  Laterality: N/A;  h&p in file-hope  . Atrial fibrillation ablation  07/31/2012; 08-10-13    PVI by Dr Rayann Heman  . Lumbar laminectomy/decompression microdiscectomy Right 12/23/2012    Procedure: Right L5-S1 Laminotomy for resection of synovial cyst;  Surgeon: Hosie Spangle, MD;  Location: [facility] NEURO ORS;  Service: Neurosurgery;  Laterality: Right;  Right Lumbar five-sacral one Laminotomy for resection of synovial cyst  . Tee without cardioversion N/A 08/09/2013    Procedure: TRANSESOPHAGEAL ECHOCARDIOGRAM (TEE);  Surgeon: Lelon Perla, MD;  Location: [facility] ENDOSCOPY;  Service: Cardiovascular;  Laterality: N/A;  . Cardiac catheterization  12/03/2010    single vessel mid RCA  . Nm myocar perf wall motion  12/20/2010    normal  . Atrial fibrillation ablation N/A 07/31/2012    Procedure: ATRIAL FIBRILLATION ABLATION;  Surgeon: Thompson Grayer, MD;  Location: [facility] CATH LAB;  Service: Cardiovascular;  Laterality: N/A;  . Atrial fibrillation ablation N/A 08/10/2013    Procedure: ATRIAL FIBRILLATION ABLATION;  Surgeon: Coralyn Mark, MD;  Location: [place] CATH LAB;  Service: Cardiovascular;  Laterality: N/A;    Family History  Problem Relation Age of Onset  . Heart disease Sister   . Breast cancer Sister   . Breast cancer Sister     History   Social History  . Marital Status: Married    Spouse Name: [name]  .  Number of Children: N/A  . Years of Education: N/A   Occupational History  . housewife    Social History Main Topics  . Smoking status: Never Smoker   . Smokeless tobacco: Never Used  . Alcohol Use: No  . Drug Use: No  . Sexual Activity: Not Currently    Birth Control/ Protection: Abstinence   Other Topics Concern  . Not on file   Social History Narrative   Lives in [place].  Retired.    Review of systems: Recent episode of nausea and vomiting, resolved The patient specifically denies any chest pain at rest exertion, dyspnea at rest or with exertion, orthopnea, paroxysmal nocturnal dyspnea, syncope, palpitations, focal neurological deficits, intermittent claudication, lower extremity edema, unexplained weight gain, cough, hemoptysis or wheezing.   PHYSICAL EXAM BP 116/58 mmHg  Pulse 76  Resp 20  Ht 4\' 11"  (1.499 m)  Wt 144 lb (65.318 kg)  BMI 29.07 kg/m2  General: Alert, oriented x3, no distress Head: no evidence of trauma, PERRL, EOMI, no exophtalmos or lid lag, no myxedema, no xanthelasma; normal ears, nose and oropharynx Neck: normal jugular venous pulsations and no hepatojugular reflux; brisk carotid pulses without delay and no carotid bruits Chest: clear to auscultation, no signs of consolidation by percussion or palpation, normal fremitus, symmetrical and full respiratory excursions, L3 subclavian pacemaker site Cardiovascular: normal position and quality of the apical impulse, regular rhythm, normal first and second heart sounds, no murmurs, rubs or gallops Abdomen: no tenderness or distention, no masses by palpation, no abnormal pulsatility or arterial bruits, normal bowel sounds, no hepatosplenomegaly Extremities: no clubbing, cyanosis or edema; 2+ radial, ulnar and brachial pulses bilaterally; 2+ right femoral, posterior tibial and dorsalis pedis pulses; 2+ left femoral, posterior tibial and dorsalis pedis pulses; no subclavian or femoral bruits Neurological:  grossly nonfocal   EKG: Atrial paced ventricular sensed rhythm, incomplete right bundle branch block, prominent ST depression and T-wave inversion in leads V3-V6, 2, 3, aVF especially more prominent than on previous tracings  Lipid Panel July 11 2014  total cholesterol of 168, triglycerides 71, HDL 83, LDL 71. Her hemoglobin A1c was 6.2%.   BMET    Component Value Date/Time   NA 140 08/11/2013 0237   K 3.6* 08/11/2013 0237   CL 104 08/11/2013 0237   CO2 25 08/11/2013 0237   GLUCOSE 97 08/11/2013 0237   BUN 10 08/11/2013 0237   CREATININE 0.69 08/11/2013 0237   CALCIUM 8.7 08/11/2013 0237   GFRNONAA 87* 08/11/2013 0237   GFRAA >90 08/11/2013 0237     ASSESSMENT AND PLAN Chronic diastolic CHF (congestive heart failure), NYHA class 1 Functional class I. Appears clinically euvolemic on a low dose of loop diuretics. Preserved left ventricular systolic function. Avoid treatment with Actos, class I antiarrhythmics and Multaq.  CAD (coronary artery disease) Asymptomatic, but the ECG changes are quite concerning. He has a known 70% mid right coronary artery lesion area was scheduled for a follow-up nuclear perfusion study Recent labs do not show electrolyte abnormalities that could cause ECG changes. Lipid profile and other risk factors are well controlled.  Atrial fibrillation Continues to have 13% burden of atrial fibrillation, but appears to be less symptomatic than in the past.  Pacemaker St. Jude accent DR RF , July 2012 (second degree AV block) Atrial pacing dependent but with intact AV conduction. Normal device function. No permanent reprogramming changes performed. Remote monitoring every 3 months. She has frequent ventricular pacing and she should undergo Lexiscan Myoview rather than treadmill stress testing  Orders Placed This Encounter  Procedures  . Myocardial Perfusion Imaging  . EKG 12-Lead   No orders of the defined types were placed in this encounter.     Holli Humbles, MD, [place] [phone] office [phone] pager

## 2014-08-02 ENCOUNTER — Encounter: Payer: Self-pay | Admitting: Cardiovascular Disease

## 2014-08-05 ENCOUNTER — Telehealth (HOSPITAL_COMMUNITY): Payer: Self-pay

## 2014-08-05 NOTE — Telephone Encounter (Signed)
Encounter complete. 

## 2014-08-07 LAB — MDC_IDC_ENUM_SESS_TYPE_INCLINIC
Battery Remaining Percentage: 73 %
Battery Voltage: 2.92 V
Brady Statistic RV Percent Paced: 43 %
Implantable Pulse Generator Model: 2210
Implantable Pulse Generator Serial Number: 7255453
Lead Channel Impedance Value: 400 Ohm
Lead Channel Impedance Value: 400 Ohm
Lead Channel Pacing Threshold Amplitude: 0.75 V
Lead Channel Pacing Threshold Amplitude: 1 V
Lead Channel Pacing Threshold Pulse Width: 0.4 ms
Lead Channel Sensing Intrinsic Amplitude: 2.5 mV
Lead Channel Setting Pacing Amplitude: 1.625
Lead Channel Setting Pacing Amplitude: 2.5 V
Lead Channel Setting Pacing Pulse Width: 0.4 ms
MDC IDC MSMT LEADCHNL RA PACING THRESHOLD PULSEWIDTH: 0.4 ms
MDC IDC MSMT LEADCHNL RV SENSING INTR AMPL: 7.6 mV
MDC IDC SET LEADCHNL RV SENSING SENSITIVITY: 2 mV
MDC IDC STAT BRADY RA PERCENT PACED: 87 %

## 2014-08-10 ENCOUNTER — Ambulatory Visit (HOSPITAL_COMMUNITY)
Admission: RE | Admit: 2014-08-10 | Discharge: 2014-08-10 | Disposition: A | Discharge status: Home / Self Care | Payer: Medicare Other | Source: Ambulatory Visit | Attending: Internal Medicine | Admitting: Internal Medicine

## 2014-08-10 DIAGNOSIS — I509 Heart failure, unspecified: Secondary | ICD-10-CM | POA: Diagnosis not present

## 2014-08-10 DIAGNOSIS — E119 Type 2 diabetes mellitus without complications: Secondary | ICD-10-CM | POA: Diagnosis not present

## 2014-08-10 DIAGNOSIS — I2510 Atherosclerotic heart disease of native coronary artery without angina pectoris: Secondary | ICD-10-CM | POA: Diagnosis not present

## 2014-08-10 DIAGNOSIS — Z8249 Family history of ischemic heart disease and other diseases of the circulatory system: Secondary | ICD-10-CM | POA: Insufficient documentation

## 2014-08-10 DIAGNOSIS — I10 Essential (primary) hypertension: Secondary | ICD-10-CM | POA: Insufficient documentation

## 2014-08-10 DIAGNOSIS — I4891 Unspecified atrial fibrillation: Secondary | ICD-10-CM | POA: Insufficient documentation

## 2014-08-10 DIAGNOSIS — R0600 Dyspnea, unspecified: Secondary | ICD-10-CM | POA: Diagnosis not present

## 2014-08-10 DIAGNOSIS — R9431 Abnormal electrocardiogram [ECG] [EKG]: Secondary | ICD-10-CM | POA: Diagnosis not present

## 2014-08-10 MED ORDER — TECHNETIUM TC 99M SESTAMIBI GENERIC - CARDIOLITE
31.5000 | Freq: Once | INTRAVENOUS | Status: AC | PRN
  Administered 2014-08-10: 32 via INTRAVENOUS

## 2014-08-10 MED ORDER — REGADENOSON 0.4 MG/5ML IV SOLN
0.4000 mg | Freq: Once | INTRAVENOUS | Status: AC
  Administered 2014-08-10: 0.4 mg via INTRAVENOUS

## 2014-08-10 MED ORDER — TECHNETIUM TC 99M SESTAMIBI GENERIC - CARDIOLITE
10.3000 | Freq: Once | INTRAVENOUS | Status: AC | PRN
  Administered 2014-08-10: 10 via INTRAVENOUS

## 2014-08-10 MED ORDER — AMINOPHYLLINE 25 MG/ML IV SOLN
75.0000 mg | Freq: Once | INTRAVENOUS | Status: AC
  Administered 2014-08-10: 75 mg via INTRAVENOUS

## 2014-08-10 NOTE — Procedures (Addendum)
[place] [address] [place] [address] [phone]  Cardiology Nuclear Med Study  Hailey [name] is a 69 y.o. female     MRN : [id number]     DOB: 03/20/1946  Procedure Date: 08/10/2014  Nuclear Med Background Indication for Stress Test:  Abnormal EKG and Follow up CAD History:  CAD;Last NUC MPI on 12/20/2010-normal;EF=91%;PTVP;AFIB;TEE w/o cardioversion;ablation;CHF;PAF;Polmunary nodule Cardiac Risk Factors: Family History - CAD, Hypertension and NIDDM  Symptoms:  DOE   Nuclear Pre-Procedure Caffeine/Decaff Intake:  1:00am NPO After: 9:00am   IV Site: R Forearm  IV 0.9% NS with Angio Cath:  22g  Chest Size (in):  n/a IV Started by: Rolene Course, RN  Height: 4\' 11"  (1.499 m)  Cup Size: B  BMI:  Body mass index is 29.07 kg/(m^2). Weight:  144 lb (65.318 kg)   Tech Comments:  n/a    Nuclear Med Study 1 or 2 day study: 1 day  Stress Test Type:  [place] Provider:  Sanda Klein, MD   Resting Radionuclide: Technetium 71m Sestamibi  Resting Radionuclide Dose: 10.5 mCi   Stress Radionuclide:  Technetium 20m Sestamibi  Stress Radionuclide Dose: 31.5 mCi           Stress Protocol Rest HR:67 Stress HR: 68  Rest BP: 132/69 Stress BP: 150/54  Exercise Time (min): n/a METS: n/a          Dose of Adenosine (mg):  n/a Dose of Lexiscan: 0.4 mg  Dose of Atropine (mg): n/a Dose of Dobutamine: n/a mcg/kg/min (at max HR)  Stress Test Technologist: Mellody Memos, CCT Nuclear Technologist: Vedia Pereyra, CNMT   Rest Procedure:  Myocardial perfusion imaging was performed at rest 45 minutes following the intravenous administration of Technetium 81m Sestamibi. Stress Procedure:  The patient received IV Lexiscan 0.4 mg over 15-seconds.  Technetium 31m Sestamibi injected IV at 30-seconds. Patient experienced shortness of breath, chest tightness and was administered 75 mg of Aminophylline IV.  There were no  significant changes with Lexiscan.  Quantitative spect images were obtained after a 45 minute delay.  Transient Ischemic Dilatation (Normal <1.22): 0.98  QGS EDV:  56 ml QGS ESV:  15 ml LV Ejection Fraction: 72%  PHYSICIAN INTERPETATION  Rest ECG: NSR with non-specific ST-T wave changes  Stress ECG: No significant change from baseline ECG and No significant ST segment change suggestive of ischemia.  QPS Raw Data Images:  Mild breast attenuation; normal left ventricular size.  Increased tracer uptake in RV free wall - suggestive of elevated RV strain Stress Images:  There is decreased uptake in the anterior wall. Rest Images:  There is decreased uptake in the anterior wall. Subtraction (SDS):  There is a fixed anteriour defect that is most consistent with breast attenuation. No reversibility is appreciated. There is no evidence of scar or ischemia. Normal  Impression Exercise Capacity:  Lexiscan with no exercise. BP Response:  Normal blood pressure response. Clinical Symptoms:  Atypical chest pain. ECG Impression:  No significant ECG changes with Lexiscan. Comparison with Prior Nuclear Study: No images to compare  Overall Impression:  Normal stress nuclear study. and Low risk stress nuclear study with mild breast attenuation artifact.  LV Wall Motion:  NL LV Function; NL Wall Motion   Leonie Man, MD  08/10/2014 5:40 PM

## 2014-08-11 ENCOUNTER — Encounter: Payer: Self-pay | Admitting: Cardiovascular Disease

## 2014-08-12 ENCOUNTER — Encounter: Payer: Self-pay | Admitting: *Deleted

## 2014-08-17 ENCOUNTER — Telehealth: Payer: Self-pay | Admitting: Cardiovascular Disease

## 2014-08-17 DIAGNOSIS — L905 Scar conditions and fibrosis of skin: Secondary | ICD-10-CM | POA: Diagnosis not present

## 2014-08-17 NOTE — Telephone Encounter (Signed)
Pt advised of results, voiced understanding.

## 2014-08-17 NOTE — Telephone Encounter (Signed)
Pt would like her stress test results from 08-10-14 please. If not at home please call her at [phone].

## 2014-08-30 ENCOUNTER — Other Ambulatory Visit: Payer: Self-pay | Admitting: Cardiovascular Disease

## 2014-08-31 NOTE — Telephone Encounter (Signed)
Rx(s) sent to pharmacy electronically.  

## 2014-09-19 ENCOUNTER — Other Ambulatory Visit: Payer: Self-pay | Admitting: Cardiovascular Disease

## 2014-09-19 NOTE — Telephone Encounter (Signed)
Rx(s) sent to pharmacy electronically.  

## 2014-10-05 DIAGNOSIS — E0590 Thyrotoxicosis, unspecified without thyrotoxic crisis or storm: Secondary | ICD-10-CM | POA: Diagnosis not present

## 2014-10-05 DIAGNOSIS — I10 Essential (primary) hypertension: Secondary | ICD-10-CM | POA: Diagnosis not present

## 2014-10-05 DIAGNOSIS — E118 Type 2 diabetes mellitus with unspecified complications: Secondary | ICD-10-CM | POA: Diagnosis not present

## 2014-10-12 DIAGNOSIS — I10 Essential (primary) hypertension: Secondary | ICD-10-CM | POA: Diagnosis not present

## 2014-10-12 DIAGNOSIS — E0590 Thyrotoxicosis, unspecified without thyrotoxic crisis or storm: Secondary | ICD-10-CM | POA: Diagnosis not present

## 2014-10-12 DIAGNOSIS — E119 Type 2 diabetes mellitus without complications: Secondary | ICD-10-CM | POA: Diagnosis not present

## 2014-10-13 ENCOUNTER — Encounter: Payer: Self-pay | Admitting: Cardiovascular Disease

## 2014-10-26 DIAGNOSIS — I482 Chronic atrial fibrillation: Secondary | ICD-10-CM | POA: Diagnosis not present

## 2014-10-26 DIAGNOSIS — E0500 Thyrotoxicosis with diffuse goiter without thyrotoxic crisis or storm: Secondary | ICD-10-CM | POA: Diagnosis not present

## 2014-10-26 DIAGNOSIS — E118 Type 2 diabetes mellitus with unspecified complications: Secondary | ICD-10-CM | POA: Diagnosis not present

## 2014-10-27 ENCOUNTER — Ambulatory Visit (INDEPENDENT_AMBULATORY_CARE_PROVIDER_SITE_OTHER): Payer: Medicare Other | Admitting: *Deleted

## 2014-10-27 DIAGNOSIS — I480 Paroxysmal atrial fibrillation: Secondary | ICD-10-CM

## 2014-10-27 NOTE — Progress Notes (Signed)
Remote pacemaker transmission.   

## 2014-11-02 ENCOUNTER — Other Ambulatory Visit: Payer: Self-pay | Admitting: Cardiovascular Disease

## 2014-11-04 LAB — CUP PACEART REMOTE DEVICE CHECK
Battery Remaining Longevity: 71 mo
Battery Remaining Percentage: 67 %
Battery Voltage: 2.93 V
Brady Statistic AP VS Percent: 63 %
Brady Statistic AS VS Percent: 1 %
Brady Statistic RA Percent Paced: 91 %
Brady Statistic RV Percent Paced: 42 %
Date Time Interrogation Session: 20160526063139
Lead Channel Impedance Value: 390 Ohm
Lead Channel Pacing Threshold Amplitude: 1 V
Lead Channel Pacing Threshold Pulse Width: 0.4 ms
Lead Channel Pacing Threshold Pulse Width: 0.4 ms
Lead Channel Sensing Intrinsic Amplitude: 5.9 mV
Lead Channel Setting Pacing Amplitude: 1.625
Lead Channel Setting Pacing Pulse Width: 0.4 ms
MDC IDC MSMT LEADCHNL RA PACING THRESHOLD AMPLITUDE: 0.625 V
MDC IDC MSMT LEADCHNL RA SENSING INTR AMPL: 2.1 mV
MDC IDC MSMT LEADCHNL RV IMPEDANCE VALUE: 390 Ohm
MDC IDC PG SERIAL: 7255453
MDC IDC SET LEADCHNL RV PACING AMPLITUDE: 2.5 V
MDC IDC SET LEADCHNL RV SENSING SENSITIVITY: 2 mV
MDC IDC STAT BRADY AP VP PERCENT: 36 %
MDC IDC STAT BRADY AS VP PERCENT: 1 %

## 2014-11-11 ENCOUNTER — Encounter: Payer: Self-pay | Admitting: Cardiology

## 2014-11-15 ENCOUNTER — Encounter: Payer: Self-pay | Admitting: Cardiovascular Disease

## 2014-12-15 DIAGNOSIS — R140 Abdominal distension (gaseous): Secondary | ICD-10-CM | POA: Diagnosis not present

## 2014-12-15 DIAGNOSIS — R152 Fecal urgency: Secondary | ICD-10-CM | POA: Diagnosis not present

## 2014-12-15 DIAGNOSIS — Z800 Family history of malignant neoplasm of digestive organs: Secondary | ICD-10-CM | POA: Diagnosis not present

## 2014-12-15 DIAGNOSIS — Z1211 Encounter for screening for malignant neoplasm of colon: Secondary | ICD-10-CM | POA: Diagnosis not present

## 2014-12-15 DIAGNOSIS — K5289 Other specified noninfective gastroenteritis and colitis: Secondary | ICD-10-CM | POA: Diagnosis not present

## 2014-12-21 DIAGNOSIS — L821 Other seborrheic keratosis: Secondary | ICD-10-CM | POA: Diagnosis not present

## 2014-12-21 DIAGNOSIS — L814 Other melanin hyperpigmentation: Secondary | ICD-10-CM | POA: Diagnosis not present

## 2014-12-21 DIAGNOSIS — D1801 Hemangioma of skin and subcutaneous tissue: Secondary | ICD-10-CM | POA: Diagnosis not present

## 2014-12-21 DIAGNOSIS — D225 Melanocytic nevi of trunk: Secondary | ICD-10-CM | POA: Diagnosis not present

## 2014-12-21 DIAGNOSIS — L570 Actinic keratosis: Secondary | ICD-10-CM | POA: Diagnosis not present

## 2015-01-04 DIAGNOSIS — Z1231 Encounter for screening mammogram for malignant neoplasm of breast: Secondary | ICD-10-CM | POA: Diagnosis not present

## 2015-01-23 ENCOUNTER — Other Ambulatory Visit: Payer: Self-pay | Admitting: Gastroenterology

## 2015-01-25 DIAGNOSIS — E118 Type 2 diabetes mellitus with unspecified complications: Secondary | ICD-10-CM | POA: Diagnosis not present

## 2015-01-25 DIAGNOSIS — I482 Chronic atrial fibrillation: Secondary | ICD-10-CM | POA: Diagnosis not present

## 2015-01-26 ENCOUNTER — Ambulatory Visit (INDEPENDENT_AMBULATORY_CARE_PROVIDER_SITE_OTHER): Payer: Medicare Other | Admitting: *Deleted

## 2015-01-26 DIAGNOSIS — I480 Paroxysmal atrial fibrillation: Secondary | ICD-10-CM

## 2015-01-26 NOTE — Progress Notes (Signed)
Remote pacemaker transmission.   

## 2015-02-07 LAB — CUP PACEART REMOTE DEVICE CHECK
Brady Statistic AS VP Percent: 1 %
Brady Statistic RA Percent Paced: 92 %
Date Time Interrogation Session: 20160825060009
Lead Channel Pacing Threshold Pulse Width: 0.4 ms
Lead Channel Pacing Threshold Pulse Width: 0.4 ms
Lead Channel Sensing Intrinsic Amplitude: 6.1 mV
Lead Channel Setting Pacing Amplitude: 2.5 V
Lead Channel Setting Pacing Pulse Width: 0.4 ms
Lead Channel Setting Sensing Sensitivity: 2 mV
MDC IDC MSMT BATTERY REMAINING LONGEVITY: 75 mo
MDC IDC MSMT BATTERY REMAINING PERCENTAGE: 73 %
MDC IDC MSMT BATTERY VOLTAGE: 2.92 V
MDC IDC MSMT LEADCHNL RA IMPEDANCE VALUE: 400 Ohm
MDC IDC MSMT LEADCHNL RA PACING THRESHOLD AMPLITUDE: 0.75 V
MDC IDC MSMT LEADCHNL RA SENSING INTR AMPL: 2.5 mV
MDC IDC MSMT LEADCHNL RV IMPEDANCE VALUE: 350 Ohm
MDC IDC MSMT LEADCHNL RV PACING THRESHOLD AMPLITUDE: 1 V
MDC IDC PG SERIAL: 7255453
MDC IDC SET LEADCHNL RA PACING AMPLITUDE: 1.75 V
MDC IDC STAT BRADY AP VP PERCENT: 38 %
MDC IDC STAT BRADY AP VS PERCENT: 62 %
MDC IDC STAT BRADY AS VS PERCENT: 1 %
MDC IDC STAT BRADY RV PERCENT PACED: 43 %
Pulse Gen Model: 2210

## 2015-02-09 DIAGNOSIS — E0590 Thyrotoxicosis, unspecified without thyrotoxic crisis or storm: Secondary | ICD-10-CM | POA: Diagnosis not present

## 2015-02-09 DIAGNOSIS — I10 Essential (primary) hypertension: Secondary | ICD-10-CM | POA: Diagnosis not present

## 2015-02-09 DIAGNOSIS — E119 Type 2 diabetes mellitus without complications: Secondary | ICD-10-CM | POA: Diagnosis not present

## 2015-02-15 DIAGNOSIS — E119 Type 2 diabetes mellitus without complications: Secondary | ICD-10-CM | POA: Diagnosis not present

## 2015-02-15 DIAGNOSIS — E0590 Thyrotoxicosis, unspecified without thyrotoxic crisis or storm: Secondary | ICD-10-CM | POA: Diagnosis not present

## 2015-02-15 DIAGNOSIS — I480 Paroxysmal atrial fibrillation: Secondary | ICD-10-CM | POA: Diagnosis not present

## 2015-02-15 DIAGNOSIS — Z23 Encounter for immunization: Secondary | ICD-10-CM | POA: Diagnosis not present

## 2015-02-15 DIAGNOSIS — I10 Essential (primary) hypertension: Secondary | ICD-10-CM | POA: Diagnosis not present

## 2015-02-17 ENCOUNTER — Encounter: Payer: Self-pay | Admitting: Cardiology

## 2015-02-24 ENCOUNTER — Encounter: Payer: Self-pay | Admitting: Cardiovascular Disease

## 2015-03-10 ENCOUNTER — Encounter (HOSPITAL_COMMUNITY): Payer: Self-pay | Admitting: *Deleted

## 2015-03-21 ENCOUNTER — Encounter (HOSPITAL_COMMUNITY): Payer: Self-pay | Admitting: Anesthesiology

## 2015-03-21 ENCOUNTER — Ambulatory Visit (HOSPITAL_COMMUNITY): Payer: Medicare Other | Admitting: Anesthesiology

## 2015-03-21 ENCOUNTER — Encounter (HOSPITAL_COMMUNITY)
Admission: RE | Disposition: A | Discharge status: Home / Self Care | Payer: Self-pay | Source: Ambulatory Visit | Attending: Gastroenterology

## 2015-03-21 ENCOUNTER — Ambulatory Visit (HOSPITAL_COMMUNITY)
Admission: RE | Admit: 2015-03-21 | Discharge: 2015-03-21 | Disposition: A | Discharge status: Home / Self Care | Payer: Medicare Other | Source: Ambulatory Visit | Attending: Gastroenterology | Admitting: Gastroenterology

## 2015-03-21 DIAGNOSIS — E119 Type 2 diabetes mellitus without complications: Secondary | ICD-10-CM | POA: Diagnosis not present

## 2015-03-21 DIAGNOSIS — I5033 Acute on chronic diastolic (congestive) heart failure: Secondary | ICD-10-CM | POA: Diagnosis not present

## 2015-03-21 DIAGNOSIS — D125 Benign neoplasm of sigmoid colon: Secondary | ICD-10-CM | POA: Diagnosis not present

## 2015-03-21 DIAGNOSIS — E0590 Thyrotoxicosis, unspecified without thyrotoxic crisis or storm: Secondary | ICD-10-CM | POA: Diagnosis not present

## 2015-03-21 DIAGNOSIS — Z86010 Personal history of colonic polyps: Secondary | ICD-10-CM | POA: Insufficient documentation

## 2015-03-21 DIAGNOSIS — Z950 Presence of cardiac pacemaker: Secondary | ICD-10-CM | POA: Diagnosis not present

## 2015-03-21 DIAGNOSIS — M1990 Unspecified osteoarthritis, unspecified site: Secondary | ICD-10-CM | POA: Diagnosis not present

## 2015-03-21 DIAGNOSIS — D175 Benign lipomatous neoplasm of intra-abdominal organs: Secondary | ICD-10-CM | POA: Diagnosis not present

## 2015-03-21 DIAGNOSIS — K5790 Diverticulosis of intestine, part unspecified, without perforation or abscess without bleeding: Secondary | ICD-10-CM | POA: Diagnosis not present

## 2015-03-21 DIAGNOSIS — I110 Hypertensive heart disease with heart failure: Secondary | ICD-10-CM | POA: Diagnosis not present

## 2015-03-21 DIAGNOSIS — I480 Paroxysmal atrial fibrillation: Secondary | ICD-10-CM | POA: Insufficient documentation

## 2015-03-21 DIAGNOSIS — D127 Benign neoplasm of rectosigmoid junction: Secondary | ICD-10-CM | POA: Diagnosis not present

## 2015-03-21 DIAGNOSIS — Z7984 Long term (current) use of oral hypoglycemic drugs: Secondary | ICD-10-CM | POA: Diagnosis not present

## 2015-03-21 DIAGNOSIS — K5730 Diverticulosis of large intestine without perforation or abscess without bleeding: Secondary | ICD-10-CM | POA: Diagnosis not present

## 2015-03-21 DIAGNOSIS — K648 Other hemorrhoids: Secondary | ICD-10-CM | POA: Diagnosis not present

## 2015-03-21 DIAGNOSIS — Z79899 Other long term (current) drug therapy: Secondary | ICD-10-CM | POA: Diagnosis not present

## 2015-03-21 DIAGNOSIS — I2510 Atherosclerotic heart disease of native coronary artery without angina pectoris: Secondary | ICD-10-CM | POA: Diagnosis not present

## 2015-03-21 DIAGNOSIS — K649 Unspecified hemorrhoids: Secondary | ICD-10-CM | POA: Diagnosis not present

## 2015-03-21 DIAGNOSIS — Z1211 Encounter for screening for malignant neoplasm of colon: Secondary | ICD-10-CM | POA: Diagnosis not present

## 2015-03-21 DIAGNOSIS — K635 Polyp of colon: Secondary | ICD-10-CM | POA: Diagnosis not present

## 2015-03-21 HISTORY — DX: Cardiac arrhythmia, unspecified: I49.9

## 2015-03-21 HISTORY — DX: Presence of cardiac pacemaker: Z95.0

## 2015-03-21 HISTORY — PX: COLONOSCOPY WITH PROPOFOL: SHX5780

## 2015-03-21 HISTORY — DX: Nausea with vomiting, unspecified: R11.2

## 2015-03-21 HISTORY — DX: Other specified postprocedural states: Z98.890

## 2015-03-21 LAB — GLUCOSE, CAPILLARY: Glucose-Capillary: 123 mg/dL — ABNORMAL HIGH (ref 65–99)

## 2015-03-21 SURGERY — COLONOSCOPY WITH PROPOFOL
Anesthesia: Monitor Anesthesia Care

## 2015-03-21 MED ORDER — PROPOFOL 10 MG/ML IV BOLUS
INTRAVENOUS | Status: AC
  Filled 2015-03-21: qty 20

## 2015-03-21 MED ORDER — PROPOFOL 10 MG/ML IV BOLUS
INTRAVENOUS | Status: DC | PRN
  Administered 2015-03-21: 50 mg via INTRAVENOUS
  Administered 2015-03-21 (×2): 20 mg via INTRAVENOUS

## 2015-03-21 MED ORDER — PROPOFOL 500 MG/50ML IV EMUL
INTRAVENOUS | Status: DC | PRN
  Administered 2015-03-21: 75 ug/kg/min via INTRAVENOUS

## 2015-03-21 MED ORDER — LACTATED RINGERS IV SOLN
INTRAVENOUS | Status: DC
  Administered 2015-03-21: 07:00:00 via INTRAVENOUS

## 2015-03-21 MED ORDER — SODIUM CHLORIDE 0.9 % IV SOLN: INTRAVENOUS | Status: DC

## 2015-03-21 SURGICAL SUPPLY — 22 items

## 2015-03-21 NOTE — Anesthesia Postprocedure Evaluation (Signed)
Anesthesia Post Note  Patient: Hailey [name]  Procedure(s) Performed: Procedure(s) (LRB): COLONOSCOPY WITH PROPOFOL (N/A)  Anesthesia type: MAC  Patient location: PACU  Post pain: Pain level controlled  Post assessment: Patient's Cardiovascular Status Stable  Last Vitals:  Filed Vitals:   03/21/15 0830  BP:   Pulse: 71  Temp:   Resp: 22    Post vital signs: Reviewed and stable  Level of consciousness: sedated  Complications: No apparent anesthesia complications

## 2015-03-21 NOTE — Anesthesia Preprocedure Evaluation (Signed)
Anesthesia Evaluation  Patient identified by MRN, date of birth, ID band Patient awake    Reviewed: Allergy & Precautions, NPO status , Patient's Chart, lab work & pertinent test results  History of Anesthesia Complications (+) PONV  Airway Mallampati: I  TM Distance: >3 FB Neck ROM: Full    Dental   Pulmonary    Pulmonary exam normal        Cardiovascular hypertension, Pt. on medications Normal cardiovascular exam+ pacemaker      Neuro/Psych    GI/Hepatic   Endo/Other  diabetes, Type 2  Renal/GU      Musculoskeletal   Abdominal   Peds  Hematology   Anesthesia Other Findings   Reproductive/Obstetrics                             Anesthesia Physical Anesthesia Plan  ASA: III  Anesthesia Plan: MAC   Post-op Pain Management:    Induction: Intravenous  Airway Management Planned: Natural Airway  Additional Equipment:   Intra-op Plan:   Post-operative Plan:   Informed Consent: I have reviewed the patients History and Physical, chart, labs and discussed the procedure including the risks, benefits and alternatives for the proposed anesthesia with the patient or authorized representative who has indicated his/her understanding and acceptance.     Plan Discussed with: CRNA and Surgeon  Anesthesia Plan Comments:         Anesthesia Quick Evaluation

## 2015-03-21 NOTE — Transfer of Care (Signed)
Immediate Anesthesia Transfer of Care Note  Patient: Hailey [name]  Procedure(s) Performed: Procedure(s): COLONOSCOPY WITH PROPOFOL (N/A)  Patient Location: PACU  Anesthesia Type:MAC  Level of Consciousness:  sedated, patient cooperative and responds to stimulation  Airway & Oxygen Therapy:Patient Spontanous Breathing and Patient connected to face mask oxgen  Post-op Assessment:  Report given to PACU RN and Post -op Vital signs reviewed and stable  Post vital signs:  Reviewed and stable  Last Vitals:  Filed Vitals:   03/21/15 0631  BP: 135/56  Pulse: 68  Temp: 36.8 C  Resp: 16    Complications: No apparent anesthesia complications

## 2015-03-21 NOTE — Discharge Instructions (Signed)

## 2015-03-21 NOTE — Op Note (Signed)
[facility] [place], [address]   OPERATIVE PROCEDURE REPORT  PATIENT: Hailey [name], Hailey [name]  MR#: [id number] BIRTHDATE: [birth year] GENDER: female ENDOSCOPIST: Edmonia James, MD ASSISTANT:   Sharon Mt, technician & Glenice Bow RN & Tory Emerald, RN. PROCEDURE DATE: Mar 24, 2015 PRE-PROCEDURE PREPARATION: Patient fasted for 4 hours prior to procedure. The patient was prepped with a gallon of Golytely the night prior to the procedure. PRE-PROCEDURE PHYSICAL: Patient has stable vital signs.  Neck is supple.  There is no JVD, thyromegaly or LAD.  Chest clear to auscultation.  Pacemaker on left chest. S1 and S2 paced. Abdomen soft, non-distended, non-tender with NABS. PROCEDURE:     Colonoscopy with hot snare polypectomy x 1. ASA CLASS:     Class III INDICATIONS:     1.  Colorectal cancer screening. MEDICATIONS:     Monitored anesthesia care  DESCRIPTION OF PROCEDURE: After the risks, benefits, and alternatives of the procedure were thoroughly explained [including a 10% missed rate of cancer and polyps], informed consent was obtained.  Digital rectal exam was performed.  The Pentax Ped Colon [id number]  was introduced through the anus  and advanced to the cecum, which was identified by both the appendix and ileocecal valve  No adverse events experienced.  The quality of the prep was good. . Multiple washes were done. Small lesions could be missed. The instrument was then slowly withdrawn as the colon was fully examined. Estimated blood loss is zero unless otherwise noted in this procedure report.     COLON FINDINGS: A 7 mm sessile polyp was found in the rectosigmoid colon and a polypectomy was performed using snare cautery x 1-200/20. There were a couple of small diverticula in the sigmoid colon. A medium sized lipoma was found in the ascending colon. Moderate sized internal hemorrhoids were noted on retroflexion. The rest of the colonic mucosa  appeared healthy with a normal vascular pattern. No masses or AVMs were noted. The appendiceal orifice and the ICV were identified and photographed. The patient tolerated the procedure without immediate complications. The scope was then withdrawn from the patient and the procedure terminated.  TIME TO CECUM:  7  mnutes 00 seconds WITHDRAW TIME:  9 minutes 00 seconds  IMPRESSION:     1) One small sessile lyp was found in the rectosigmoid colon-; polypectomy was performed using hot snare cautery. 2) A couple of small sigmoid diverticula. 3) Medium sized lipoma in the mid-right colon. 4) Moderate sized internal hemorrhoids.  RECOMMENDATIONS:     1.  Await pathology results. 2.  Continue current medications except for Pradaxa-hold for 2 days. 3.  Continue surveillance. 4.  High fiber diet with liberal fluid intake. 5.  OP follow-up is advised on a PRN basis.  REPEAT EXAM:      In 7 years  for a repeat colonoscopy or Cologaurd test.  If the patient has any abnormal GI symptoms in the interim, she have been advised to contact the office as soon as possible for further recommendations.   REFERRED IW:LNLGX Maudie Mercury, M.D. eSigned:  Edmonia James, MD 03-24-2015 8:08 AM  CPT CODES:     [phone] Colonoscopy, flexible, proximal to splenic flexure; with removal of tumor(s), polyp(s), or other lesion(s) by snare technique ICD CODES:     K63.5, Colon polyp; K57.30, Diverticulosis Z86.0, Personal history of colonic polypsZ12.11 Encounter for screening for malignant neoplasm of colon  The ICD and CPT codes recommended by this software are interpretations from the data that  the clinical staff has captured with the software.  The verification of the translation of this report to the ICD and CPT codes and modifiers is the sole responsibility of the health care institution and practicing physician where this report was generated.  [employer]. will not be held responsible for the  validity of the ICD and CPT codes included on this report.  AMA assumes no liability for data contained or not contained herein. CPT is a Designer, television/film set of the [employer].  PATIENT NAME:  Hailey [name], Hailey [name] MR#: [id number]

## 2015-03-21 NOTE — H&P (Signed)
Hailey [name] is an 69 y.o. female.   Chief Complaint: Colorectal cancer screening. HPI: 69 year old white female here for a screening colonoscopy. She office notes for details.  Past Medical History  Diagnosis Date  . Hypertension   . Paroxysmal atrial fibrillation (HCC)   . Diabetes mellitus ([place])   . Allergic rhinitis   . DJD (degenerative joint disease)   . Brady-tachy syndrome (HCC)     s/p STJ dual chamber PPM by Dr Sallyanne Kuster  . CAD (coronary artery disease)     70% RCA stenosis, treated medically  . Polio     as a child  . Hyperthyroidism   . Acute on chronic diastolic CHF (congestive heart failure), NYHA class 1 ([place]) 04/29/2013  . PONV (postoperative nausea and vomiting)   . Presence of permanent cardiac pacemaker   . Dysrhythmia     Paroxysmal Atrial Fib.   Past Surgical History  Procedure Laterality Date  . Cholecystectomy  1994  . Total abdominal hysterectomy  1981  . Pacemaker insertion  12/03/10    SJM Accent DR RF implanted by Dr Sallyanne Kuster  . Arm surgery d/t fx Right 96  . Tee without cardioversion N/A 07/30/2012    Procedure: TRANSESOPHAGEAL ECHOCARDIOGRAM (TEE);  Surgeon: Sanda Klein, MD;  Location: [facility] ENDOSCOPY;  Service: Cardiovascular;  Laterality: N/A;  h&p in file-[facility]  . Atrial fibrillation ablation  07/31/2012; 08-10-13    PVI by Dr Rayann Heman  . Lumbar laminectomy/decompression microdiscectomy Right 12/23/2012    Procedure: Right L5-S1 Laminotomy for resection of synovial cyst;  Surgeon: Hosie Spangle, MD;  Location: [place] NEURO ORS;  Service: Neurosurgery;  Laterality: Right;  Right Lumbar five-sacral one Laminotomy for resection of synovial cyst  . Tee without cardioversion N/A 08/09/2013    Procedure: TRANSESOPHAGEAL ECHOCARDIOGRAM (TEE);  Surgeon: Lelon Perla, MD;  Location: [facility] ENDOSCOPY;  Service: Cardiovascular;  Laterality: N/A;  . Cardiac catheterization  12/03/2010    single vessel mid RCA  . Nm myocar perf wall motion  12/20/2010    normal  .  Atrial fibrillation ablation N/A 07/31/2012    Procedure: ATRIAL FIBRILLATION ABLATION;  Surgeon: Thompson Grayer, MD;  Location: [facility] CATH LAB;  Service: Cardiovascular;  Laterality: N/A;  . Atrial fibrillation ablation N/A 08/10/2013    Procedure: ATRIAL FIBRILLATION ABLATION;  Surgeon: Coralyn Mark, MD;  Location: [place] CATH LAB;  Service: Cardiovascular;  Laterality: N/A;   Family History  Problem Relation Age of Onset  . Heart disease Sister   . Breast cancer Sister   . Breast cancer Sister    Social History:  reports that she has never smoked. She has never used smokeless tobacco. She reports that she does not drink alcohol or use illicit drugs.  Allergies:  Allergies  Allergen Reactions  . Ketorolac Nausea And Vomiting and Other (See Comments)    "pt told to never take this med"  . Codeine Nausea And Vomiting  . Demerol Nausea And Vomiting  . Multaq [Dronedarone] Other (See Comments)    Increased fluid retention   Medications Prior to Admission  Medication Sig Dispense Refill  . CALCIUM CARBONATE PO Take 600 mg by mouth daily.    . Cholecalciferol (VITAMIN D3) 1000 UNITS CAPS Take 1 capsule by mouth daily.     . Cyanocobalamin (VITAMIN B12 PO) (300 MG) Dissolve one under the tongue each morning    . diltiazem (DILACOR XR) 180 MG 24 hr capsule TAKE ONE CAPSULE BY MOUTH ONCE DAILY 30 capsule 10  .  furosemide (LASIX) 40 MG tablet TAKE ONE TABLET BY MOUTH ONCE DAILY 30 tablet 10  . loperamide (IMODIUM A-D) 2 MG tablet Take 2 mg by mouth 4 (four) times daily as needed for diarrhea or loose stools.    Marland Kitchen loratadine (ALLERGY RELIEF) 10 MG tablet Take 10 mg by mouth daily as needed for allergies.     . metoprolol (LOPRESSOR) 50 MG tablet TAKE ONE TABLET BY MOUTH TWICE DAILY 60 tablet 10  . oxybutynin (DITROPAN-XL) 5 MG 24 hr tablet Take 5 mg by mouth every morning.     . pantoprazole (PROTONIX) 40 MG tablet Take 40 mg by mouth daily.    Marland Kitchen PRADAXA 150 MG CAPS capsule TAKE ONE CAPSULE BY MOUTH  TWICE DAILY 60 capsule 11  . propylthiouracil (PTU) 50 MG tablet Take 50-100 mg by mouth daily. Takes 2 tablets every morning and 1 tablet every night    . saxagliptin HCl (ONGLYZA) 5 MG TABS tablet Take 5 mg by mouth daily.    . valsartan (DIOVAN) 80 MG tablet Take 80 mg by mouth every evening.       Results for orders placed or performed during the hospital encounter of 03/21/15 (from the past 48 hour(s))  Glucose, capillary     Status: Abnormal   Collection Time: 03/21/15  6:45 AM  Result Value Ref Range   Glucose-Capillary 123 (H) 65 - 99 mg/dL   Review of Systems  Constitutional: Negative.   Eyes: Negative.   Respiratory: Negative.   Cardiovascular: Negative.   Gastrointestinal: Positive for heartburn and diarrhea. Negative for nausea, vomiting, abdominal pain, constipation, blood in stool and melena.  Musculoskeletal: Positive for joint pain.  Neurological: Negative.   Psychiatric/Behavioral: Negative.    Blood pressure 135/56, pulse 68, temperature 98.2 F (36.8 C), temperature source Oral, resp. rate 16, height 4\' 11"  (1.499 m), weight 64.864 kg (143 lb), SpO2 100 %. Physical Exam  Constitutional: She appears well-developed and well-nourished.  HENT:  Head: Normocephalic and atraumatic.  Eyes: Conjunctivae and EOM are normal. Pupils are equal, round, and reactive to light.  Neck: Normal range of motion. Neck supple.  Cardiovascular:  Paced rhythm  Respiratory: Effort normal.  Pacemaker on the left [id number]   GI: Soft. Bowel sounds are normal.  Musculoskeletal: Normal range of motion.  Neurological: She is alert.  Skin: Skin is warm and dry.  Psychiatric: She has a normal mood and affect. Her behavior is normal. Judgment and thought content normal.    Assessment/Plan Colorectal cancer screening/personal history of colonic polyps: proceed with a colonoscopy at this time.  Hailey [name] 03/21/2015, 7:16 AM

## 2015-03-22 ENCOUNTER — Encounter (HOSPITAL_COMMUNITY): Payer: Self-pay | Admitting: Gastroenterology

## 2015-04-05 ENCOUNTER — Encounter: Payer: Self-pay | Admitting: Internal Medicine

## 2015-04-05 ENCOUNTER — Ambulatory Visit (INDEPENDENT_AMBULATORY_CARE_PROVIDER_SITE_OTHER): Payer: Medicare Other | Admitting: Internal Medicine

## 2015-04-05 VITALS — BP 112/70 | HR 86 | Ht 59.0 in | Wt 143.6 lb

## 2015-04-05 DIAGNOSIS — R001 Bradycardia, unspecified: Secondary | ICD-10-CM

## 2015-04-05 DIAGNOSIS — I480 Paroxysmal atrial fibrillation: Secondary | ICD-10-CM

## 2015-04-05 DIAGNOSIS — I2510 Atherosclerotic heart disease of native coronary artery without angina pectoris: Secondary | ICD-10-CM | POA: Diagnosis not present

## 2015-04-05 LAB — CUP PACEART INCLINIC DEVICE CHECK
Brady Statistic RV Percent Paced: 44 %
Implantable Lead Implant Date: 20120702
Implantable Lead Location: 753860
Lead Channel Impedance Value: 350 Ohm
Lead Channel Impedance Value: 387.5 Ohm
Lead Channel Pacing Threshold Amplitude: 1.25 V
Lead Channel Pacing Threshold Pulse Width: 0.4 ms
Lead Channel Sensing Intrinsic Amplitude: 8.2 mV
Lead Channel Setting Pacing Amplitude: 2.5 V
Lead Channel Setting Sensing Sensitivity: 2 mV
MDC IDC LEAD IMPLANT DT: 20120702
MDC IDC LEAD LOCATION: 753859
MDC IDC MSMT BATTERY REMAINING LONGEVITY: 62.4
MDC IDC MSMT BATTERY VOLTAGE: 2.9 V
MDC IDC MSMT LEADCHNL RA PACING THRESHOLD AMPLITUDE: 0.75 V
MDC IDC MSMT LEADCHNL RA PACING THRESHOLD PULSEWIDTH: 0.4 ms
MDC IDC MSMT LEADCHNL RA SENSING INTR AMPL: 2.3 mV
MDC IDC MSMT LEADCHNL RV PACING THRESHOLD AMPLITUDE: 1.25 V
MDC IDC MSMT LEADCHNL RV PACING THRESHOLD PULSEWIDTH: 0.4 ms
MDC IDC SESS DTM: 20161102151850
MDC IDC SET LEADCHNL RA PACING AMPLITUDE: 1.75 V
MDC IDC SET LEADCHNL RV PACING PULSEWIDTH: 0.4 ms
MDC IDC STAT BRADY RA PERCENT PACED: 93 %
Pulse Gen Serial Number: 7255453

## 2015-04-05 MED ORDER — DABIGATRAN ETEXILATE MESYLATE 150 MG PO CAPS: 150.0000 mg | ORAL_CAPSULE | Freq: Two times a day (BID) | ORAL | Status: DC

## 2015-04-05 NOTE — Patient Instructions (Addendum)
Medication Instructions:  Your physician recommends that you continue on your current medications as directed. Please refer to the Current Medication list given to you today.   Labwork: None ordered   Testing/Procedures: None ordered   Follow-Up: Your physician wants you to follow-up in: 6 months with Dr Olevia Perches and 12 months with Dr Vallery Ridge will receive a reminder letter in the mail two months in advance. If you don't receive a letter, please call our office to schedule the follow-up appointment.  Remote monitoring is used to monitor your Pacemaker from home. This monitoring reduces the number of office visits required to check your device to one time per year. It allows [place] to keep an eye on the functioning of your device to ensure it is working properly. You are scheduled for a device check from home on 07/05/15. You may send your transmission at any time that day. If you have a wireless device, the transmission will be sent automatically. After your physician reviews your transmission, you will receive a postcard with your next transmission date.     Any Other Special Instructions Will Be Listed Below (If Applicable).     If you need a refill on your cardiac medications before your next appointment, please call your pharmacy.

## 2015-04-05 NOTE — Progress Notes (Signed)
PCP: Jani Gravel, MD Primary Cardiologist:  Dr Sallyanne Kuster  Hailey [name] is a 69 y.o. female who presents today for routine electrophysiology followup.  Since her last visit, the patient reports doing reasonably well.  She is really unaware of her afib.   Today, she denies symptoms of chest pain, shortness of breath,  lower extremity edema, dizziness, presyncope, or syncope.  The patient is otherwise without complaint today.   Past Medical History  Diagnosis Date  . Hypertension   . Paroxysmal atrial fibrillation (HCC)   . Diabetes mellitus ([place])   . Allergic rhinitis   . DJD (degenerative joint disease)   . Brady-tachy syndrome (HCC)     s/p STJ dual chamber PPM by Dr Sallyanne Kuster  . CAD (coronary artery disease)     70% RCA stenosis, treated medically  . Polio     as a child  . Hyperthyroidism   . Acute on chronic diastolic CHF (congestive heart failure), NYHA class 1 ([place]) 04/29/2013  . PONV (postoperative nausea and vomiting)   . Presence of permanent cardiac pacemaker   . Dysrhythmia     Paroxysmal Atrial Fib.   Past Surgical History  Procedure Laterality Date  . Cholecystectomy  1994  . Total abdominal hysterectomy  1981  . Pacemaker insertion  12/03/10    SJM Accent DR RF implanted by Dr Sallyanne Kuster  . Arm surgery d/t fx Right 96  . Tee without cardioversion N/A 07/30/2012    Procedure: TRANSESOPHAGEAL ECHOCARDIOGRAM (TEE);  Surgeon: Sanda Klein, MD;  Location: [facility] ENDOSCOPY;  Service: Cardiovascular;  Laterality: N/A;  h&p in file-[facility]  . Atrial fibrillation ablation  07/31/2012; 08-10-13    PVI by Dr Rayann Heman  . Lumbar laminectomy/decompression microdiscectomy Right 12/23/2012    Procedure: Right L5-S1 Laminotomy for resection of synovial cyst;  Surgeon: Hosie Spangle, MD;  Location: [place] NEURO ORS;  Service: Neurosurgery;  Laterality: Right;  Right Lumbar five-sacral one Laminotomy for resection of synovial cyst  . Tee without cardioversion N/A 08/09/2013    Procedure:  TRANSESOPHAGEAL ECHOCARDIOGRAM (TEE);  Surgeon: Lelon Perla, MD;  Location: [facility] ENDOSCOPY;  Service: Cardiovascular;  Laterality: N/A;  . Cardiac catheterization  12/03/2010    single vessel mid RCA  . Nm myocar perf wall motion  12/20/2010    normal  . Atrial fibrillation ablation N/A 07/31/2012    Procedure: ATRIAL FIBRILLATION ABLATION;  Surgeon: Thompson Grayer, MD;  Location[facility] CATH LAB;  Service: Cardiovascular;  Laterality: N/A;  . Atrial fibrillation ablation N/A 08/10/2013    Procedure: ATRIAL FIBRILLATION ABLATION;  Surgeon: Coralyn Mark, MD;  Location: [place] CATH LAB;  Service: Cardiovascular;  Laterality: N/A;  . Colonoscopy with propofol N/A 03/21/2015    Procedure: COLONOSCOPY WITH PROPOFOL;  Surgeon: Juanita Craver, MD;  Location: [facility] ENDOSCOPY;  Service: Endoscopy;  Laterality: N/A;    Current Outpatient Prescriptions  Medication Sig Dispense Refill  . CALCIUM CARBONATE PO Take 600 mg by mouth daily.    . Cholecalciferol (VITAMIN D3) 1000 UNITS CAPS Take 1 capsule by mouth daily.     . Cyanocobalamin (VITAMIN B12 PO) (300 MG) Dissolve one under the tongue each morning    . diltiazem (DILACOR XR) 180 MG 24 hr capsule TAKE ONE CAPSULE BY MOUTH ONCE DAILY 30 capsule 10  . furosemide (LASIX) 40 MG tablet TAKE ONE TABLET BY MOUTH ONCE DAILY 30 tablet 10  . loperamide (IMODIUM A-D) 2 MG tablet Take 2 mg by mouth 4 (four) times daily as needed for diarrhea  or loose stools.    . metoprolol (LOPRESSOR) 50 MG tablet TAKE ONE TABLET BY MOUTH TWICE DAILY 60 tablet 10  . OVER THE COUNTER MEDICATION Take 1 tablet by mouth daily as needed (allergies). [employer] Brand allergy medication    . oxybutynin (DITROPAN-XL) 5 MG 24 hr tablet Take 5 mg by mouth every morning.     . pantoprazole (PROTONIX) 40 MG tablet Take 40 mg by mouth daily.    Marland Kitchen propylthiouracil (PTU) 50 MG tablet Take 50-100 mg by mouth daily. Takes 2 tablets every morning and 1 tablet every night    . saxagliptin HCl (ONGLYZA) 5 MG TABS  tablet Take 5 mg by mouth daily.    . valsartan (DIOVAN) 80 MG tablet Take 80 mg by mouth every evening.     . dabigatran (PRADAXA) 150 MG CAPS capsule Take 1 capsule (150 mg total) by mouth 2 (two) times daily. 60 capsule 11   No current facility-administered medications for this visit.    Physical Exam: Filed Vitals:   04/05/15 1425  BP: 112/70  Pulse: 86  Height: 4\' 11"  (1.499 m)  Weight: 143 lb 9.6 oz (65.137 kg)    GEN- The patient is well appearing, alert and oriented x 3 today.   Head- normocephalic, atraumatic Eyes-  Sclera clear, conjunctiva pink Ears- hearing intact Oropharynx- clear Lungs- Clear to ausculation bilaterally, normal work of breathing Heart- Regular rate and rhythm, no murmurs, rubs or gallops, PMI not laterally displaced GI- soft, NT, ND, + BS Extremities- no clubbing, cyanosis, or edema  Assessment and Plan:  1. afib afib burden by PPM interrogation today is 7%.  This is less than last year (16%).  She is doing quite well .  I would therefore not recommend any changes to her afib management at this time.  We will continue to monitor her afib burden going forward.    2. Tachy/brady Normal pacemaker function See Pace Art report  Merlin Return to see Dr Sallyanne Kuster as scheduled I will see again in a year  Thompson Grayer MD, [facility] 04/05/2015 4:25 PM

## 2015-04-26 DIAGNOSIS — E789 Disorder of lipoprotein metabolism, unspecified: Secondary | ICD-10-CM | POA: Diagnosis not present

## 2015-04-26 DIAGNOSIS — E119 Type 2 diabetes mellitus without complications: Secondary | ICD-10-CM | POA: Diagnosis not present

## 2015-04-26 DIAGNOSIS — I10 Essential (primary) hypertension: Secondary | ICD-10-CM | POA: Diagnosis not present

## 2015-05-03 DIAGNOSIS — I4891 Unspecified atrial fibrillation: Secondary | ICD-10-CM | POA: Diagnosis not present

## 2015-05-03 DIAGNOSIS — R197 Diarrhea, unspecified: Secondary | ICD-10-CM | POA: Diagnosis not present

## 2015-05-03 DIAGNOSIS — E118 Type 2 diabetes mellitus with unspecified complications: Secondary | ICD-10-CM | POA: Diagnosis not present

## 2015-06-07 DIAGNOSIS — E119 Type 2 diabetes mellitus without complications: Secondary | ICD-10-CM | POA: Diagnosis not present

## 2015-06-07 DIAGNOSIS — E0590 Thyrotoxicosis, unspecified without thyrotoxic crisis or storm: Secondary | ICD-10-CM | POA: Diagnosis not present

## 2015-06-07 DIAGNOSIS — I10 Essential (primary) hypertension: Secondary | ICD-10-CM | POA: Diagnosis not present

## 2015-06-14 DIAGNOSIS — Z1389 Encounter for screening for other disorder: Secondary | ICD-10-CM | POA: Diagnosis not present

## 2015-06-14 DIAGNOSIS — I480 Paroxysmal atrial fibrillation: Secondary | ICD-10-CM | POA: Diagnosis not present

## 2015-06-14 DIAGNOSIS — I10 Essential (primary) hypertension: Secondary | ICD-10-CM | POA: Diagnosis not present

## 2015-06-14 DIAGNOSIS — E139 Other specified diabetes mellitus without complications: Secondary | ICD-10-CM | POA: Diagnosis not present

## 2015-07-05 ENCOUNTER — Ambulatory Visit (INDEPENDENT_AMBULATORY_CARE_PROVIDER_SITE_OTHER): Payer: Medicare Other | Admitting: *Deleted

## 2015-07-05 DIAGNOSIS — I480 Paroxysmal atrial fibrillation: Secondary | ICD-10-CM

## 2015-07-05 NOTE — Progress Notes (Signed)
Remote pacemaker transmission.   

## 2015-07-31 LAB — CUP PACEART REMOTE DEVICE CHECK
Battery Voltage: 2.9 V
Brady Statistic AS VP Percent: 1 %
Brady Statistic RA Percent Paced: 89 %
Brady Statistic RV Percent Paced: 44 %
Implantable Lead Implant Date: 20120702
Implantable Lead Location: 753860
Lead Channel Impedance Value: 390 Ohm
Lead Channel Pacing Threshold Amplitude: 1.25 V
Lead Channel Pacing Threshold Pulse Width: 0.4 ms
Lead Channel Sensing Intrinsic Amplitude: 5.8 mV
Lead Channel Setting Pacing Amplitude: 1.625
Lead Channel Setting Pacing Amplitude: 2.5 V
Lead Channel Setting Pacing Pulse Width: 0.4 ms
MDC IDC LEAD IMPLANT DT: 20120702
MDC IDC LEAD LOCATION: 753859
MDC IDC MSMT BATTERY REMAINING LONGEVITY: 67 mo
MDC IDC MSMT BATTERY REMAINING PERCENTAGE: 65 %
MDC IDC MSMT LEADCHNL RA IMPEDANCE VALUE: 380 Ohm
MDC IDC MSMT LEADCHNL RA PACING THRESHOLD AMPLITUDE: 0.625 V
MDC IDC MSMT LEADCHNL RA PACING THRESHOLD PULSEWIDTH: 0.4 ms
MDC IDC MSMT LEADCHNL RA SENSING INTR AMPL: 2.7 mV
MDC IDC PG SERIAL: 7255453
MDC IDC SESS DTM: 20170201075705
MDC IDC SET LEADCHNL RV SENSING SENSITIVITY: 2 mV
MDC IDC STAT BRADY AP VP PERCENT: 36 %
MDC IDC STAT BRADY AP VS PERCENT: 63 %
MDC IDC STAT BRADY AS VS PERCENT: 1 %
Pulse Gen Model: 2210

## 2015-08-02 ENCOUNTER — Encounter: Payer: Self-pay | Admitting: Cardiology

## 2015-08-02 DIAGNOSIS — I480 Paroxysmal atrial fibrillation: Secondary | ICD-10-CM | POA: Diagnosis not present

## 2015-08-02 DIAGNOSIS — E0590 Thyrotoxicosis, unspecified without thyrotoxic crisis or storm: Secondary | ICD-10-CM | POA: Diagnosis not present

## 2015-08-02 DIAGNOSIS — H9201 Otalgia, right ear: Secondary | ICD-10-CM | POA: Diagnosis not present

## 2015-08-03 ENCOUNTER — Other Ambulatory Visit: Payer: Self-pay | Admitting: Cardiovascular Disease

## 2015-08-03 NOTE — Telephone Encounter (Signed)
REFILL 

## 2015-08-10 ENCOUNTER — Telehealth: Payer: Self-pay | Admitting: *Deleted

## 2015-08-10 NOTE — Telephone Encounter (Signed)
Spoke to patient about VT episode from 08/02/15. Patient denies any sx's of ShOB, CP, dizziness, or syncope during the time of the episode.  Will inform Dr.Croitoru about episode and call patient with any further recommendations.  Patient voiced understanding.

## 2015-08-18 ENCOUNTER — Other Ambulatory Visit: Payer: Self-pay | Admitting: Cardiovascular Disease

## 2015-08-18 NOTE — Telephone Encounter (Signed)
Rx request sent to pharmacy.  

## 2015-08-30 DIAGNOSIS — M419 Scoliosis, unspecified: Secondary | ICD-10-CM | POA: Diagnosis not present

## 2015-08-30 DIAGNOSIS — M4726 Other spondylosis with radiculopathy, lumbar region: Secondary | ICD-10-CM | POA: Diagnosis not present

## 2015-08-30 DIAGNOSIS — M5136 Other intervertebral disc degeneration, lumbar region: Secondary | ICD-10-CM | POA: Diagnosis not present

## 2015-08-30 DIAGNOSIS — M47816 Spondylosis without myelopathy or radiculopathy, lumbar region: Secondary | ICD-10-CM | POA: Diagnosis not present

## 2015-08-30 DIAGNOSIS — Z6831 Body mass index (BMI) 31.0-31.9, adult: Secondary | ICD-10-CM | POA: Diagnosis not present

## 2015-09-14 ENCOUNTER — Other Ambulatory Visit: Payer: Self-pay | Admitting: Cardiovascular Disease

## 2015-09-14 NOTE — Telephone Encounter (Signed)
REFILL 

## 2015-09-26 DIAGNOSIS — M5136 Other intervertebral disc degeneration, lumbar region: Secondary | ICD-10-CM | POA: Diagnosis not present

## 2015-09-26 DIAGNOSIS — M419 Scoliosis, unspecified: Secondary | ICD-10-CM | POA: Diagnosis not present

## 2015-09-26 DIAGNOSIS — M4726 Other spondylosis with radiculopathy, lumbar region: Secondary | ICD-10-CM | POA: Diagnosis not present

## 2015-10-04 ENCOUNTER — Ambulatory Visit (INDEPENDENT_AMBULATORY_CARE_PROVIDER_SITE_OTHER): Payer: Medicare Other | Admitting: *Deleted

## 2015-10-04 DIAGNOSIS — R001 Bradycardia, unspecified: Secondary | ICD-10-CM

## 2015-10-04 DIAGNOSIS — I480 Paroxysmal atrial fibrillation: Secondary | ICD-10-CM

## 2015-10-05 NOTE — Progress Notes (Signed)
Remote pacemaker transmission.   

## 2015-10-10 ENCOUNTER — Encounter: Payer: Self-pay | Admitting: Cardiovascular Disease

## 2015-10-10 ENCOUNTER — Ambulatory Visit (INDEPENDENT_AMBULATORY_CARE_PROVIDER_SITE_OTHER): Payer: Medicare Other | Admitting: Cardiovascular Disease

## 2015-10-10 VITALS — BP 124/66 | HR 70 | Ht 59.0 in | Wt 146.4 lb

## 2015-10-10 DIAGNOSIS — I5032 Chronic diastolic (congestive) heart failure: Secondary | ICD-10-CM

## 2015-10-10 DIAGNOSIS — I472 Ventricular tachycardia: Secondary | ICD-10-CM

## 2015-10-10 DIAGNOSIS — I480 Paroxysmal atrial fibrillation: Secondary | ICD-10-CM

## 2015-10-10 DIAGNOSIS — I2510 Atherosclerotic heart disease of native coronary artery without angina pectoris: Secondary | ICD-10-CM | POA: Diagnosis not present

## 2015-10-10 DIAGNOSIS — Z7901 Long term (current) use of anticoagulants: Secondary | ICD-10-CM

## 2015-10-10 DIAGNOSIS — R001 Bradycardia, unspecified: Secondary | ICD-10-CM | POA: Diagnosis not present

## 2015-10-10 DIAGNOSIS — I441 Atrioventricular block, second degree: Secondary | ICD-10-CM | POA: Insufficient documentation

## 2015-10-10 DIAGNOSIS — Z950 Presence of cardiac pacemaker: Secondary | ICD-10-CM | POA: Diagnosis not present

## 2015-10-10 DIAGNOSIS — I4729 Other ventricular tachycardia: Secondary | ICD-10-CM

## 2015-10-10 HISTORY — DX: Atrioventricular block, second degree: I44.1

## 2015-10-10 NOTE — Patient Instructions (Addendum)
Medication Instructions: Dr Sallyanne Kuster recommends that you continue on your current medications as directed. Please refer to the Current Medication list given to you today.  Labwork: NONE ORDERED  Testing/Procedures: 1. Echocardiogram - Your physician has requested that you have an echocardiogram. Echocardiography is a painless test that uses sound waves to create images of your heart. It provides your doctor with information about the size and shape of your heart and how well your heart's chambers and valves are working. This procedure takes approximately one hour. There are no restrictions for this procedure.  2. Remote Pacemaker Download - Remote monitoring is used to monitor your Pacemaker of ICD from home. This monitoring reduces the number of office visits required to check your device to one time per year. It allows [place] to keep an eye on the functioning of your device to ensure it is working properly. You are scheduled for a device check from home on Thursday, August 10th, 2017. You may send your transmission at any time that day. If you have a wireless device, the transmission will be sent automatically. After your physician reviews your transmission, you will receive a postcard with your next transmission date.  Follow-up: Dr Sallyanne Kuster recommends that you schedule a follow-up appointment in 6 months with a pacemaker check. You will receive a reminder letter in the mail two months in advance. If you don't receive a letter, please call our office to schedule the follow-up appointment.  If you need a refill on your cardiac medications before your next appointment, please call your pharmacy.

## 2015-10-10 NOTE — Progress Notes (Signed)
Patient ID: Hailey [name], female   DOB: 1946-05-29, 70 y.o.   MRN: [id number]    Cardiology Office Note    Date:  10/11/2015   ID:  Amoni, Cumings Apr 23, 1946, MRN [id number]  PCP:  Jani Gravel, MD  Cardiologist:  Thompson Grayer, MD;  Sanda Klein, MD   Chief Complaint  Patient presents with  . Follow-up    pacer check, no chest pain    History of Present Illness:  Hailey [name] is a 70 y.o. female with paroxysmal atrial fibrillation (s/p RF ablation procedures),s/p dual-chamber permanent pacemaker (St. Jude Accent 2012 for tachycardia-bradycardia syndrome and 2:1 atrioventricular block), CAD (70% stenosis in the mid right coronary artery without ischemia on nuclear perfusion imaging in 2012 and March 2016), chronic diastolic heart failure (history of acute decompensation on Multaq), type 2 diabetes mellitus, hypertension, good lipid profile without therapy, hyperthyroidism (currently euthyroid on propylthiouracil).  She generally feels well and has very infrequent palpitations, she can't even the room or the last time they bothered her. She has occasional swelling in her ankles, always more prominent on the right than the left. This routinely resolves after overnight bed rest. She is compliant with twice daily pradaxa and has not had bleeding or gastrointestinal side effects. She denies bleeding complications or focal neurological events. She does not have exertional angina or dyspnea.  Pacemaker interrogation shows normal device function. She has roughly 93 % atrial pacing and 43 % ventricular pacing. This is comparable to her historical trends. Battery longevity is estimated to be 5-6 years. The burden of atrial fibrillation is around 7.3% and has been fairly stable over the last 12 months. Ventricular rate control is generally very good with ventricular rates in the 90-100 range.  Her device has recorded one episode of high ventricular rate clearly due to nonsustained ventricular  tachycardia. The episode was quite lengthy consisting of 29 beats it occurred on March 1 around 5 PM. She does not recall having any problems around that time.  Past Medical History  Diagnosis Date  . Hypertension   . Paroxysmal atrial fibrillation (HCC)   . Diabetes mellitus ([place])   . Allergic rhinitis   . DJD (degenerative joint disease)   . Brady-tachy syndrome (HCC)     s/p STJ dual chamber PPM by Dr Sallyanne Kuster  . CAD (coronary artery disease)     70% RCA stenosis, treated medically  . Polio     as a child  . Hyperthyroidism   . Acute on chronic diastolic CHF (congestive heart failure), NYHA class 1 ([place]) 04/29/2013  . PONV (postoperative nausea and vomiting)   . Presence of permanent cardiac pacemaker   . Dysrhythmia     Paroxysmal Atrial Fib.    Past Surgical History  Procedure Laterality Date  . Cholecystectomy  1994  . Total abdominal hysterectomy  1981  . Pacemaker insertion  12/03/10    SJM Accent DR RF implanted by Dr Sallyanne Kuster  . Arm surgery d/t fx Right 96  . Tee without cardioversion N/A 07/30/2012    Procedure: TRANSESOPHAGEAL ECHOCARDIOGRAM (TEE);  Surgeon: Sanda Klein, MD;  Location[facility] END[facility]COPY;  Service: Cardiovascular;  Laterality: N/A;  h&p in file-[facility]  . Atrial fibrillation ablation  07/31/2012; 08-10-13    PVI by Dr Rayann Heman  . Lumbar laminectomy/decompression microdiscectomy Right 12/23/2012    Procedure: Right L5-S1 Laminotomy for resection of synovial cyst;  Surgeon: Hosie Spangle, MD;  Location: [place] NEURO ORS;  Service: Neurosurgery;  Laterality: Right;  Right  Lumbar five-sacral one Laminotomy for resection of synovial cyst  . Tee without cardioversion N/A 08/09/2013    Procedure: TRANSESOPHAGEAL ECHOCARDIOGRAM (TEE);  Surgeon: Lelon Perla, MD;  Location: [facility] ENDOSCOPY;  Service: Cardiovascular;  Laterality: N/A;  . Cardiac catheterization  12/03/2010    single vessel mid RCA  . Nm myocar perf wall motion  12/20/2010    normal  . Atrial fibrillation  ablation N/A 07/31/2012    Procedure: ATRIAL FIBRILLATION ABLATION;  Surgeon: Thompson Grayer, MD;  Location: [facility] CATH LAB;  Service: Cardiovascular;  Laterality: N/A;  . Atrial fibrillation ablation N/A 08/10/2013    Procedure: ATRIAL FIBRILLATION ABLATION;  Surgeon: Coralyn Mark, MD;  Location: [place] CATH LAB;  Service: Cardiovascular;  Laterality: N/A;  . Colonoscopy with propofol N/A 03/21/2015    Procedure: COLONOSCOPY WITH PROPOFOL;  Surgeon: Juanita Craver, MD;  Location: [facility] ENDOSCOPY;  Service: Endoscopy;  Laterality: N/A;    Current Medications: Outpatient Prescriptions Prior to Visit  Medication Sig Dispense Refill  . CALCIUM CARBONATE PO Take 600 mg by mouth daily.    . Cholecalciferol (VITAMIN D3) 1000 UNITS CAPS Take 1 capsule by mouth daily.     . Cyanocobalamin (VITAMIN B12 PO) (300 MG) Dissolve one under the tongue each morning    . dabigatran (PRADAXA) 150 MG CAPS capsule Take 1 capsule (150 mg total) by mouth 2 (two) times daily. 60 capsule 11  . diltiazem (DILACOR XR) 180 MG 24 hr capsule TAKE ONE CAPSULE BY MOUTH ONCE DAILY 30 capsule 2  . furosemide (LASIX) 40 MG tablet TAKE ONE TABLET BY MOUTH ONCE DAILY 30 tablet 0  . loperamide (IMODIUM A-D) 2 MG tablet Take 2 mg by mouth 4 (four) times daily as needed for diarrhea or loose stools.    . metoprolol (LOPRESSOR) 50 MG tablet TAKE ONE TABLET BY MOUTH TWICE DAILY 60 tablet 11  . OVER THE COUNTER MEDICATION Take 1 tablet by mouth daily as needed (allergies). [employer] Brand allergy medication    . pantoprazole (PROTONIX) 40 MG tablet Take 40 mg by mouth daily.    Marland Kitchen propylthiouracil (PTU) 50 MG tablet Take 50-100 mg by mouth daily. Takes 2 tablets every morning and 1 tablet every night    . saxagliptin HCl (ONGLYZA) 5 MG TABS tablet Take 5 mg by mouth daily.    . valsartan (DIOVAN) 80 MG tablet Take 80 mg by mouth every evening.     Marland Kitchen oxybutynin (DITROPAN-XL) 5 MG 24 hr tablet Take 5 mg by mouth every morning.      No  facility-administered medications prior to visit.     Allergies:   Ketorolac; Codeine; Demerol; and Multaq   Social History   Social History  . Marital Status: Married    Spouse Name: [name]  . Number of Children: N/A  . Years of Education: N/A   Occupational History  . housewife    Social History Main Topics  . Smoking status: Never Smoker   . Smokeless tobacco: Never Used  . Alcohol Use: No  . Drug Use: No  . Sexual Activity: Not Currently    Birth Control/ Protection: Abstinence   Other Topics Concern  . None   Social History Narrative   Lives in [place].  Retired.     Family History:  The patient's family history includes Breast cancer in her sister and sister; Heart disease in her sister.   ROS:   Please see the history of present illness.    ROS All other  systems reviewed and are negative.   PHYSICAL EXAM:   VS:  BP 124/66 mmHg  Pulse 70  Ht 4\' 11"  (1.499 m)  Wt 66.395 kg (146 lb 6 oz)  BMI 29.55 kg/m2   GEN: Well nourished, well developed, in no acute distress HEENT: normal Neck: no JVD, carotid bruits, or masses Cardiac: RRR; no murmurs, rubs, or gallops,no edema , healthy subclavian pacemaker site Respiratory:  clear to auscultation bilaterally, normal work of breathing GI: soft, nontender, nondistended, + BS MS: no deformity or atrophy Skin: warm and dry, no rash Neuro:  Alert and Oriented x 3, Strength and sensation are intact Psych: euthymic mood, full affect  Wt Readings from Last 3 Encounters:  10/10/15 66.395 kg (146 lb 6 oz)  04/05/15 65.137 kg (143 lb 9.6 oz)  03/21/15 64.864 kg (143 lb)      Studies/Labs Reviewed:   EKG:  EKG is ordered today.  The ekg ordered today demonstrates Atrial paced ventricular paced rhythm at 107 bpm (sensor driven?). Her heart rate was about 70 by the time I examined her. She does not have atrial overdrive pacing turned on.  Lipid Panel    Component Value Date/Time   CHOL 94 11/24/2010 0837   TRIG  51 11/24/2010 0837   HDL 53 11/24/2010 0837   CHOLHDL 1.8 11/24/2010 0837   VLDL 10 11/24/2010 0837   LDLCALC 31 11/24/2010 0837    Additional studies/ records that were reviewed today include:  Office visit notes from her last appointment with Dr. Thompson Grayer    ASSESSMENT:    1. Paroxysmal atrial fibrillation (HCC)   2. Long term current use of anticoagulant   3. Symptomatic sinus bradycardia   4. Second degree AV block   5. Pacemaker St. Jude accent DR RF , July 2012   6. Coronary artery disease involving native coronary artery of native heart without angina pectoris   7. Chronic diastolic congestive heart failure ([place])   8. NSVT (nonsustained ventricular tachycardia) (HCC)      PLAN:  In order of problems listed above:  1. AFib: The overall burden is relatively low at 7% and the episodes are asymptomatic and well rate controlled. She is not having any problems with anticoagulation..CHADSVasc at least 79 (age, CAD, history of CHF). 2. Anticoagulation: Well-tolerated. Need to get hemoglobin and creatinine values from her most recent labs performed at her primary care physician 3. SSS: Heart rate histogram distribution is favorable 4. 2nd deg AVB: Requires fairly frequent ventricular pacing. No evidence of worsening cardiomyopathy due to ventricular pacing at this time. 5. PPM: Normal device function, continue remote downloads every 3 months 6. CAD: No angina. Last nuclear perfusion study showed low risk findings. Has a known 70% stenosis in the right coronary artery left for medical therapy. 7. CHF: History of temporary decompensation during treatment with Multaq, no recurrence since 8. NSVT: No symptoms associated with this episode; need to make sure her electrolytes were okay. Reevaluate left ventricular systolic function and regional wall motion by echo. She is already on a reasonable dose of beta blocker.    Medication Adjustments/Labs and Tests Ordered: Current  medicines are reviewed at length with the patient today.  Concerns regarding medicines are outlined above.  Medication changes, Labs and Tests ordered today are listed in the Patient Instructions below. Patient Instructions  Medication Instructions: Dr Sallyanne Kuster recommends that you continue on your current medications as directed. Please refer to the Current Medication list given to you today.  Labwork:  NONE ORDERED  Testing/Procedures: 1. Echocardiogram - Your physician has requested that you have an echocardiogram. Echocardiography is a painless test that uses sound waves to create images of your heart. It provides your doctor with information about the size and shape of your heart and how well your heart's chambers and valves are working. This procedure takes approximately one hour. There are no restrictions for this procedure.  2. Remote Pacemaker Download - Remote monitoring is used to monitor your Pacemaker of ICD from home. This monitoring reduces the number of office visits required to check your device to one time per year. It allows [place] to keep an eye on the functioning of your device to ensure it is working properly. You are scheduled for a device check from home on Thursday, August 10th, 2017. You may send your transmission at any time that day. If you have a wireless device, the transmission will be sent automatically. After your physician reviews your transmission, you will receive a postcard with your next transmission date.  Follow-up: Dr Sallyanne Kuster recommends that you schedule a follow-up appointment in 6 months with a pacemaker check. You will receive a reminder letter in the mail two months in advance. If you don't receive a letter, please call our office to schedule the follow-up appointment.  If you need a refill on your cardiac medications before your next appointment, please call your pharmacy.     Mikael Spray, MD  10/11/2015 1:23 PM    [facility] [place], [place], [place]  [address] Phone: [phone]; Fax: [phone]

## 2015-10-11 DIAGNOSIS — Z7901 Long term (current) use of anticoagulants: Secondary | ICD-10-CM | POA: Insufficient documentation

## 2015-10-21 LAB — CUP PACEART INCLINIC DEVICE CHECK
Implantable Lead Implant Date: 20120702
Lead Channel Setting Pacing Amplitude: 2.5 V
Lead Channel Setting Pacing Pulse Width: 0.4 ms
Lead Channel Setting Sensing Sensitivity: 2 mV
MDC IDC LEAD IMPLANT DT: 20120702
MDC IDC LEAD LOCATION: 753859
MDC IDC LEAD LOCATION: 753860
MDC IDC SESS DTM: 20170520083349
MDC IDC SET LEADCHNL RA PACING AMPLITUDE: 1.875
Pulse Gen Model: 2210
Pulse Gen Serial Number: 7255453

## 2015-10-25 ENCOUNTER — Other Ambulatory Visit: Payer: Self-pay | Admitting: Cardiovascular Disease

## 2015-10-25 ENCOUNTER — Encounter: Payer: Self-pay | Admitting: Cardiovascular Disease

## 2015-10-25 DIAGNOSIS — I10 Essential (primary) hypertension: Secondary | ICD-10-CM | POA: Diagnosis not present

## 2015-10-25 DIAGNOSIS — E119 Type 2 diabetes mellitus without complications: Secondary | ICD-10-CM | POA: Diagnosis not present

## 2015-10-25 DIAGNOSIS — E789 Disorder of lipoprotein metabolism, unspecified: Secondary | ICD-10-CM | POA: Diagnosis not present

## 2015-10-25 DIAGNOSIS — E0590 Thyrotoxicosis, unspecified without thyrotoxic crisis or storm: Secondary | ICD-10-CM | POA: Diagnosis not present

## 2015-10-28 ENCOUNTER — Other Ambulatory Visit: Payer: Self-pay | Admitting: Cardiovascular Disease

## 2015-10-31 ENCOUNTER — Other Ambulatory Visit: Payer: Self-pay

## 2015-11-01 ENCOUNTER — Telehealth: Payer: Self-pay

## 2015-11-01 ENCOUNTER — Encounter: Payer: Self-pay | Admitting: Cardiovascular Disease

## 2015-11-01 DIAGNOSIS — E118 Type 2 diabetes mellitus with unspecified complications: Secondary | ICD-10-CM | POA: Diagnosis not present

## 2015-11-01 DIAGNOSIS — E0789 Other specified disorders of thyroid: Secondary | ICD-10-CM | POA: Diagnosis not present

## 2015-11-01 DIAGNOSIS — I10 Essential (primary) hypertension: Secondary | ICD-10-CM | POA: Diagnosis not present

## 2015-11-01 DIAGNOSIS — I4891 Unspecified atrial fibrillation: Secondary | ICD-10-CM | POA: Diagnosis not present

## 2015-11-01 MED ORDER — FUROSEMIDE 40 MG PO TABS: 40.0000 mg | ORAL_TABLET | Freq: Every day | ORAL | Status: DC

## 2015-11-01 NOTE — Telephone Encounter (Signed)
Patient walked in office requesting Lasix refill.Refill phoned to pharmacy.

## 2015-11-02 ENCOUNTER — Other Ambulatory Visit: Payer: Self-pay

## 2015-11-02 ENCOUNTER — Ambulatory Visit (HOSPITAL_COMMUNITY): Payer: Medicare Other | Attending: Cardiology

## 2015-11-02 ENCOUNTER — Other Ambulatory Visit (HOSPITAL_COMMUNITY): Payer: Medicare Other

## 2015-11-02 DIAGNOSIS — I340 Nonrheumatic mitral (valve) insufficiency: Secondary | ICD-10-CM | POA: Diagnosis not present

## 2015-11-02 DIAGNOSIS — I071 Rheumatic tricuspid insufficiency: Secondary | ICD-10-CM | POA: Insufficient documentation

## 2015-11-02 DIAGNOSIS — I358 Other nonrheumatic aortic valve disorders: Secondary | ICD-10-CM | POA: Diagnosis not present

## 2015-11-02 DIAGNOSIS — I5032 Chronic diastolic (congestive) heart failure: Secondary | ICD-10-CM | POA: Diagnosis not present

## 2015-11-02 DIAGNOSIS — I2510 Atherosclerotic heart disease of native coronary artery without angina pectoris: Secondary | ICD-10-CM | POA: Insufficient documentation

## 2015-11-02 MED ORDER — PERFLUTREN LIPID MICROSPHERE
1.0000 mL | INTRAVENOUS | Status: AC | PRN
  Administered 2015-11-02: 2 mL via INTRAVENOUS

## 2015-11-03 ENCOUNTER — Other Ambulatory Visit: Payer: Self-pay | Admitting: Endocrinology

## 2015-11-03 DIAGNOSIS — E049 Nontoxic goiter, unspecified: Secondary | ICD-10-CM

## 2015-11-09 ENCOUNTER — Telehealth: Payer: Self-pay | Admitting: Cardiovascular Disease

## 2015-11-09 NOTE — Telephone Encounter (Signed)
Spoke to patient.  ECHO Result given . Verbalized understanding  

## 2015-11-09 NOTE — Telephone Encounter (Signed)
Returning call about test results

## 2015-11-15 ENCOUNTER — Other Ambulatory Visit: Payer: Self-pay | Admitting: Cardiovascular Disease

## 2015-11-15 NOTE — Telephone Encounter (Signed)
Rx request sent to pharmacy.  

## 2015-11-16 ENCOUNTER — Ambulatory Visit
Admission: RE | Admit: 2015-11-16 | Discharge: 2015-11-16 | Disposition: A | Discharge status: Home / Self Care | Payer: Medicare Other | Source: Ambulatory Visit | Attending: Endocrinology | Admitting: Endocrinology

## 2015-11-16 DIAGNOSIS — E049 Nontoxic goiter, unspecified: Secondary | ICD-10-CM

## 2015-11-16 DIAGNOSIS — E042 Nontoxic multinodular goiter: Secondary | ICD-10-CM | POA: Diagnosis not present

## 2015-12-13 DIAGNOSIS — E139 Other specified diabetes mellitus without complications: Secondary | ICD-10-CM | POA: Diagnosis not present

## 2015-12-13 DIAGNOSIS — I480 Paroxysmal atrial fibrillation: Secondary | ICD-10-CM | POA: Diagnosis not present

## 2015-12-13 DIAGNOSIS — I10 Essential (primary) hypertension: Secondary | ICD-10-CM | POA: Diagnosis not present

## 2015-12-14 DIAGNOSIS — Z6830 Body mass index (BMI) 30.0-30.9, adult: Secondary | ICD-10-CM | POA: Diagnosis not present

## 2015-12-14 DIAGNOSIS — Z124 Encounter for screening for malignant neoplasm of cervix: Secondary | ICD-10-CM | POA: Diagnosis not present

## 2015-12-20 DIAGNOSIS — I10 Essential (primary) hypertension: Secondary | ICD-10-CM | POA: Diagnosis not present

## 2015-12-20 DIAGNOSIS — I480 Paroxysmal atrial fibrillation: Secondary | ICD-10-CM | POA: Diagnosis not present

## 2015-12-20 DIAGNOSIS — E119 Type 2 diabetes mellitus without complications: Secondary | ICD-10-CM | POA: Diagnosis not present

## 2015-12-20 DIAGNOSIS — E0590 Thyrotoxicosis, unspecified without thyrotoxic crisis or storm: Secondary | ICD-10-CM | POA: Diagnosis not present

## 2015-12-29 DIAGNOSIS — Z6831 Body mass index (BMI) 31.0-31.9, adult: Secondary | ICD-10-CM | POA: Diagnosis not present

## 2015-12-29 DIAGNOSIS — M419 Scoliosis, unspecified: Secondary | ICD-10-CM | POA: Diagnosis not present

## 2015-12-29 DIAGNOSIS — M5136 Other intervertebral disc degeneration, lumbar region: Secondary | ICD-10-CM | POA: Diagnosis not present

## 2015-12-29 DIAGNOSIS — M4726 Other spondylosis with radiculopathy, lumbar region: Secondary | ICD-10-CM | POA: Diagnosis not present

## 2016-01-04 DIAGNOSIS — R87615 Unsatisfactory cytologic smear of cervix: Secondary | ICD-10-CM | POA: Diagnosis not present

## 2016-01-04 DIAGNOSIS — Z1231 Encounter for screening mammogram for malignant neoplasm of breast: Secondary | ICD-10-CM | POA: Diagnosis not present

## 2016-01-11 ENCOUNTER — Ambulatory Visit (INDEPENDENT_AMBULATORY_CARE_PROVIDER_SITE_OTHER): Payer: Medicare Other | Admitting: *Deleted

## 2016-01-11 DIAGNOSIS — I441 Atrioventricular block, second degree: Secondary | ICD-10-CM

## 2016-01-11 NOTE — Progress Notes (Signed)
Remote pacemaker transmission.   

## 2016-01-16 ENCOUNTER — Encounter: Payer: Self-pay | Admitting: Cardiology

## 2016-01-17 LAB — CUP PACEART REMOTE DEVICE CHECK
Battery Remaining Longevity: 70 mo
Battery Remaining Percentage: 65 %
Brady Statistic AP VS Percent: 59 %
Brady Statistic AS VS Percent: 1 %
Brady Statistic RV Percent Paced: 44 %
Date Time Interrogation Session: 20170810070439
Implantable Lead Implant Date: 20120702
Lead Channel Impedance Value: 410 Ohm
Lead Channel Pacing Threshold Amplitude: 1.25 V
Lead Channel Pacing Threshold Pulse Width: 0.4 ms
Lead Channel Sensing Intrinsic Amplitude: 2.1 mV
Lead Channel Setting Pacing Amplitude: 2.5 V
Lead Channel Setting Pacing Pulse Width: 0.4 ms
MDC IDC LEAD IMPLANT DT: 20120702
MDC IDC LEAD LOCATION: 753859
MDC IDC LEAD LOCATION: 753860
MDC IDC MSMT BATTERY VOLTAGE: 2.9 V
MDC IDC MSMT LEADCHNL RA PACING THRESHOLD AMPLITUDE: 1 V
MDC IDC MSMT LEADCHNL RA PACING THRESHOLD PULSEWIDTH: 0.4 ms
MDC IDC MSMT LEADCHNL RV IMPEDANCE VALUE: 480 Ohm
MDC IDC MSMT LEADCHNL RV SENSING INTR AMPL: 9 mV
MDC IDC SET LEADCHNL RA PACING AMPLITUDE: 2 V
MDC IDC SET LEADCHNL RV SENSING SENSITIVITY: 2 mV
MDC IDC STAT BRADY AP VP PERCENT: 40 %
MDC IDC STAT BRADY AS VP PERCENT: 1 %
MDC IDC STAT BRADY RA PERCENT PACED: 95 %
Pulse Gen Model: 2210
Pulse Gen Serial Number: 7255453

## 2016-01-23 DIAGNOSIS — H938X1 Other specified disorders of right ear: Secondary | ICD-10-CM | POA: Diagnosis not present

## 2016-01-30 ENCOUNTER — Other Ambulatory Visit: Payer: Self-pay

## 2016-02-07 DIAGNOSIS — M25561 Pain in right knee: Secondary | ICD-10-CM | POA: Diagnosis not present

## 2016-02-07 DIAGNOSIS — M179 Osteoarthritis of knee, unspecified: Secondary | ICD-10-CM | POA: Diagnosis not present

## 2016-02-07 DIAGNOSIS — M1990 Unspecified osteoarthritis, unspecified site: Secondary | ICD-10-CM | POA: Diagnosis not present

## 2016-03-20 DIAGNOSIS — Z23 Encounter for immunization: Secondary | ICD-10-CM | POA: Diagnosis not present

## 2016-04-05 ENCOUNTER — Encounter: Payer: Self-pay | Admitting: Internal Medicine

## 2016-04-05 ENCOUNTER — Ambulatory Visit (INDEPENDENT_AMBULATORY_CARE_PROVIDER_SITE_OTHER): Payer: Medicare Other | Admitting: Internal Medicine

## 2016-04-05 ENCOUNTER — Encounter (INDEPENDENT_AMBULATORY_CARE_PROVIDER_SITE_OTHER): Payer: Self-pay

## 2016-04-05 VITALS — BP 130/64 | HR 88 | Ht 59.0 in | Wt 145.0 lb

## 2016-04-05 DIAGNOSIS — Z950 Presence of cardiac pacemaker: Secondary | ICD-10-CM | POA: Diagnosis not present

## 2016-04-05 DIAGNOSIS — I480 Paroxysmal atrial fibrillation: Secondary | ICD-10-CM

## 2016-04-05 DIAGNOSIS — R001 Bradycardia, unspecified: Secondary | ICD-10-CM | POA: Diagnosis not present

## 2016-04-05 DIAGNOSIS — I2510 Atherosclerotic heart disease of native coronary artery without angina pectoris: Secondary | ICD-10-CM

## 2016-04-05 MED ORDER — DABIGATRAN ETEXILATE MESYLATE 150 MG PO CAPS
150.0000 mg | ORAL_CAPSULE | Freq: Two times a day (BID) | ORAL | 11 refills | Status: DC
Start: 2016-04-05 — End: 2017-04-08

## 2016-04-05 NOTE — Patient Instructions (Addendum)
Medication Instructions:  Your physician recommends that you continue on your current medications as directed. Please refer to the Current Medication list given to you today.   Labwork: None ordered   Testing/Procedures: None ordered   Follow-Up: Your physician wants you to follow-up in: 6 months with Dr Sallyanne Kuster and 12 months with Dr Vallery Ridge will receive a reminder letter in the mail two months in advance. If you don't receive a letter, please call our office to schedule the follow-up appointment.   Any Other Special Instructions Will Be Listed Below (If Applicable).     If you need a refill on your cardiac medications before your next appointment, please call your pharmacy.

## 2016-04-05 NOTE — Progress Notes (Signed)
PCP: Jani Gravel, MD Primary Cardiologist:  Dr Sallyanne Kuster  Hailey [name] is a 70 y.o. female who presents today for routine electrophysiology followup.  Since her last visit, the patient reports doing reasonably well.  She is really unaware of her afib.   Today, she denies symptoms of chest pain, shortness of breath,  lower extremity edema, dizziness, presyncope, or syncope.  The patient is otherwise without complaint today.   Past Medical History:  Diagnosis Date  . Acute on chronic diastolic CHF (congestive heart failure), NYHA class 1 ([place]) 04/29/2013  . Allergic rhinitis   . Brady-tachy syndrome (HCC)    s/p STJ dual chamber PPM by Dr Sallyanne Kuster  . CAD (coronary artery disease)    70% RCA stenosis, treated medically  . Diabetes mellitus ([place])   . DJD (degenerative joint disease)   . Dysrhythmia    Paroxysmal Atrial Fib.  . Hypertension   . Hyperthyroidism   . Paroxysmal atrial fibrillation (HCC)   . Polio    as a child  . PONV (postoperative nausea and vomiting)   . Presence of permanent cardiac pacemaker    Past Surgical History:  Procedure Laterality Date  . arm surgery d/t fx Right 96  . ATRIAL FIBRILLATION ABLATION  07/31/2012; 08-10-13   PVI by Dr Rayann Heman  . ATRIAL FIBRILLATION ABLATION N/A 07/31/2012   Procedure: ATRIAL FIBRILLATION ABLATION;  Surgeon: Thompson Grayer, MD;  Location: [facility] CATH LAB;  Service: Cardiovascular;  Laterality: N/A;  . ATRIAL FIBRILLATION ABLATION N/A 08/10/2013   Procedure: ATRIAL FIBRILLATION ABLATION;  Surgeon: Coralyn Mark, MD;  Location: [facility] CATH LAB;  Service: Cardiovascular;  Laterality: N/A;  . CARDIAC CATHETERIZATION  12/03/2010   single vessel mid RCA  . CHOLECYSTECTOMY  1994  . COLONOSCOPY WITH PROPOFOL N/A 03/21/2015   Procedure: COLONOSCOPY WITH PROPOFOL;  Surgeon: Juanita Craver, MD;  Location: [facility] ENDOSCOPY;  Service: Endoscopy;  Laterality: N/A;  . LUMBAR LAMINECTOMY/DECOMPRESSION MICRODISCECTOMY Right 12/23/2012   Procedure: Right L5-S1  Laminotomy for resection of synovial cyst;  Surgeon: Hosie Spangle, MD;  Location: [place] NEURO ORS;  Service: Neurosurgery;  Laterality: Right;  Right Lumbar five-sacral one Laminotomy for resection of synovial cyst  . NM MYOCAR PERF WALL MOTION  12/20/2010   normal  . PACEMAKER INSERTION  12/03/10   SJM Accent DR RF implanted by Dr Sallyanne Kuster  . TEE WITHOUT CARDIOVERSION N/A 07/30/2012   Procedure: TRANSESOPHAGEAL ECHOCARDIOGRAM (TEE);  Surgeon: Sanda Klein, MD;  Location: [facility] ENDOSCOPY;  Service: Cardiovascular;  Laterality: N/A;  h&p in file-[facility]  . TEE WITHOUT CARDIOVERSION N/A 08/09/2013   Procedure: TRANSESOPHAGEAL ECHOCARDIOGRAM (TEE);  Surgeon: Lelon Perla, MD;  Location: [facility] ENDOSCOPY;  Service: Cardiovascular;  Laterality: N/A;  . TOTAL ABDOMINAL HYSTERECTOMY  1981    Current Outpatient Prescriptions  Medication Sig Dispense Refill  . CALCIUM CARBONATE PO Take 600 mg by mouth daily.    . Cholecalciferol (VITAMIN D3) 1000 UNITS CAPS Take 1 capsule by mouth daily.     . Cyanocobalamin (VITAMIN B12 PO) (300 MG) Dissolve one under the tongue each morning    . cyclobenzaprine (FLEXERIL) 10 MG tablet Take half tab as needed prn    . dabigatran (PRADAXA) 150 MG CAPS capsule Take 1 capsule (150 mg total) by mouth 2 (two) times daily. 60 capsule 11  . diltiazem (DILACOR XR) 180 MG 24 hr capsule TAKE ONE CAPSULE BY MOUTH ONCE DAILY 30 capsule 6  . furosemide (LASIX) 40 MG tablet Take 1 tablet (40 mg total)  by mouth daily. 30 tablet 11  . loperamide (IMODIUM A-D) 2 MG tablet Take 2 mg by mouth daily.     . metoprolol (LOPRESSOR) 50 MG tablet TAKE ONE TABLET BY MOUTH TWICE DAILY 60 tablet 11  . OVER THE COUNTER MEDICATION Take 1 tablet by mouth daily. [employer] Brand allergy medication    . oxybutynin (DITROPAN) 5 MG tablet Take 1 tablet by mouth daily.  4  . pantoprazole (PROTONIX) 40 MG tablet Take 40 mg by mouth daily.    Marland Kitchen propylthiouracil (PTU) 50 MG tablet Take 50-100 mg by mouth daily.  Takes 2 tablets every morning and 1 tablet every night    . saxagliptin HCl (ONGLYZA) 5 MG TABS tablet Take 5 mg by mouth daily.    . valsartan (DIOVAN) 80 MG tablet Take 80 mg by mouth every evening.      No current facility-administered medications for this visit.     Physical Exam: Vitals:   04/05/16 1622  BP: 130/64  Pulse: 88  SpO2: 96%  Weight: 145 lb (65.8 kg)  Height: 4\' 11"  (1.499 m)    GEN- The patient is well appearing, alert and oriented x 3 today.   Head- normocephalic, atraumatic Eyes-  Sclera clear, conjunctiva pink Ears- hearing intact Oropharynx- clear Lungs- Clear to ausculation bilaterally, normal work of breathing Heart- Regular rate and rhythm, no murmurs, rubs or gallops, PMI not laterally displaced GI- soft, NT, ND, + BS Extremities- no clubbing, cyanosis, or edema  Assessment and Plan:  1. afib afib burden by PPM interrogation today is 3%.  This is less than last year (7%) and 2 years ago (16%).  She is doing quite well .  I would therefore not recommend any changes to her afib management at this time.     2. Tachy/brady Normal pacemaker function See Pace Art report  Merlin Return to see Dr Sallyanne Kuster in 6 months I told her that if she would prefer to follow with Dr C, I am happy to see as needed.  I am also happy to see once per year if she would prefer  Thompson Grayer MD, [facility] 04/05/2016 5:19 PM

## 2016-04-08 LAB — CUP PACEART INCLINIC DEVICE CHECK
Battery Remaining Longevity: 58 mo
Battery Voltage: 2.9 V
Brady Statistic RA Percent Paced: 96 %
Brady Statistic RV Percent Paced: 43 %
Implantable Lead Location: 753859
Lead Channel Pacing Threshold Amplitude: 0.625 V
Lead Channel Pacing Threshold Pulse Width: 0.4 ms
Lead Channel Setting Pacing Amplitude: 2.5 V
Lead Channel Setting Pacing Pulse Width: 0.8 ms
MDC IDC LEAD IMPLANT DT: 20120702
MDC IDC LEAD IMPLANT DT: 20120702
MDC IDC LEAD LOCATION: 753860
MDC IDC MSMT LEADCHNL RA IMPEDANCE VALUE: 400 Ohm
MDC IDC MSMT LEADCHNL RV IMPEDANCE VALUE: 375 Ohm
MDC IDC MSMT LEADCHNL RV PACING THRESHOLD AMPLITUDE: 1 V
MDC IDC MSMT LEADCHNL RV PACING THRESHOLD PULSEWIDTH: 0.8 ms
MDC IDC MSMT LEADCHNL RV SENSING INTR AMPL: 8.4 mV
MDC IDC PG IMPLANT DT: 20120702
MDC IDC PG SERIAL: 7255453
MDC IDC SESS DTM: 20171103202405
MDC IDC SET LEADCHNL RA PACING AMPLITUDE: 1.625
MDC IDC SET LEADCHNL RV SENSING SENSITIVITY: 2 mV

## 2016-04-12 ENCOUNTER — Encounter: Payer: Self-pay | Admitting: Cardiology

## 2016-04-23 DIAGNOSIS — E0590 Thyrotoxicosis, unspecified without thyrotoxic crisis or storm: Secondary | ICD-10-CM | POA: Diagnosis not present

## 2016-04-30 ENCOUNTER — Telehealth: Payer: Self-pay | Admitting: Cardiovascular Disease

## 2016-04-30 NOTE — Telephone Encounter (Signed)
Spoke with pt states that she had a few nose bleeds 04-22-16 she was able to get it stopped in <10 min. But today she states that today (04-30-16) it has been bleeding steadily from about 815am until 10am she states that she has pinched it and applied ice for about 10 min and it has slowed down a lot but is still dripping and now has inserted gauze in her nose. She states that she has taken pradaxa for years and would like to know if she should stop? She states that previously had 2 nosebleeds in September the 15th and the 27th, so today makes 4 times. She denies any other sx at this time no Chest pain or pressure, not hard to breathe, etc. Please advise.

## 2016-04-30 NOTE — Telephone Encounter (Signed)
Patient has a nose bleed today and has packed her nose to try to stop the bleeding.  She is taking Pradaxa and wants to know if she should continue taking it

## 2016-04-30 NOTE — Telephone Encounter (Signed)
Pt notified. She will get humidifier and try ointment

## 2016-04-30 NOTE — Telephone Encounter (Signed)
It's fine to stop the anticoagulant for a day or 2 to allow the nosebleed to stop. It's pretty common for nosebleeds to occur in the winter months, once the heat is turned on in the home and the air is drier. Try using a moisture barrier such as a small amount of Vaseline to keep the nostrils from drying out. Try humidifying the air in the bedroom. In the long run, she needs to stay on the anticoagulants to prevent strokes.

## 2016-05-02 DIAGNOSIS — E0590 Thyrotoxicosis, unspecified without thyrotoxic crisis or storm: Secondary | ICD-10-CM | POA: Diagnosis not present

## 2016-05-02 DIAGNOSIS — I10 Essential (primary) hypertension: Secondary | ICD-10-CM | POA: Diagnosis not present

## 2016-05-02 DIAGNOSIS — E118 Type 2 diabetes mellitus with unspecified complications: Secondary | ICD-10-CM | POA: Diagnosis not present

## 2016-06-08 ENCOUNTER — Other Ambulatory Visit: Payer: Self-pay | Admitting: Cardiovascular Disease

## 2016-07-08 ENCOUNTER — Ambulatory Visit (INDEPENDENT_AMBULATORY_CARE_PROVIDER_SITE_OTHER): Payer: Medicare Other | Admitting: *Deleted

## 2016-07-08 DIAGNOSIS — I480 Paroxysmal atrial fibrillation: Secondary | ICD-10-CM

## 2016-07-09 LAB — CUP PACEART REMOTE DEVICE CHECK
Battery Remaining Longevity: 61 mo
Battery Voltage: 2.89 V
Brady Statistic AP VS Percent: 60 %
Brady Statistic AS VP Percent: 1 %
Brady Statistic RA Percent Paced: 95 %
Date Time Interrogation Session: 20180205070011
Implantable Lead Implant Date: 20120702
Implantable Lead Location: 753859
Implantable Pulse Generator Implant Date: 20120702
Lead Channel Impedance Value: 380 Ohm
Lead Channel Impedance Value: 900 Ohm
Lead Channel Pacing Threshold Amplitude: 0.625 V
Lead Channel Pacing Threshold Pulse Width: 0.4 ms
Lead Channel Sensing Intrinsic Amplitude: 6.3 mV
Lead Channel Setting Pacing Amplitude: 1.625
Lead Channel Setting Pacing Amplitude: 2.5 V
MDC IDC LEAD IMPLANT DT: 20120702
MDC IDC LEAD LOCATION: 753860
MDC IDC MSMT BATTERY REMAINING PERCENTAGE: 57 %
MDC IDC MSMT LEADCHNL RA SENSING INTR AMPL: 2.1 mV
MDC IDC MSMT LEADCHNL RV PACING THRESHOLD AMPLITUDE: 1 V
MDC IDC MSMT LEADCHNL RV PACING THRESHOLD PULSEWIDTH: 0.8 ms
MDC IDC SET LEADCHNL RV PACING PULSEWIDTH: 0.8 ms
MDC IDC SET LEADCHNL RV SENSING SENSITIVITY: 2 mV
MDC IDC STAT BRADY AP VP PERCENT: 39 %
MDC IDC STAT BRADY AS VS PERCENT: 1 %
MDC IDC STAT BRADY RV PERCENT PACED: 42 %
Pulse Gen Model: 2210
Pulse Gen Serial Number: 7255453

## 2016-07-09 NOTE — Progress Notes (Signed)
Remote pacemaker transmission.   

## 2016-07-10 ENCOUNTER — Encounter: Payer: Self-pay | Admitting: Cardiology

## 2016-07-25 DIAGNOSIS — E119 Type 2 diabetes mellitus without complications: Secondary | ICD-10-CM | POA: Diagnosis not present

## 2016-07-25 DIAGNOSIS — E0590 Thyrotoxicosis, unspecified without thyrotoxic crisis or storm: Secondary | ICD-10-CM | POA: Diagnosis not present

## 2016-07-25 DIAGNOSIS — I10 Essential (primary) hypertension: Secondary | ICD-10-CM | POA: Diagnosis not present

## 2016-07-29 ENCOUNTER — Other Ambulatory Visit: Payer: Self-pay | Admitting: Cardiovascular Disease

## 2016-07-31 DIAGNOSIS — I4891 Unspecified atrial fibrillation: Secondary | ICD-10-CM | POA: Diagnosis not present

## 2016-07-31 DIAGNOSIS — E118 Type 2 diabetes mellitus with unspecified complications: Secondary | ICD-10-CM | POA: Diagnosis not present

## 2016-07-31 DIAGNOSIS — E0590 Thyrotoxicosis, unspecified without thyrotoxic crisis or storm: Secondary | ICD-10-CM | POA: Diagnosis not present

## 2016-08-08 DIAGNOSIS — I10 Essential (primary) hypertension: Secondary | ICD-10-CM | POA: Diagnosis not present

## 2016-08-08 DIAGNOSIS — E119 Type 2 diabetes mellitus without complications: Secondary | ICD-10-CM | POA: Diagnosis not present

## 2016-08-09 DIAGNOSIS — I10 Essential (primary) hypertension: Secondary | ICD-10-CM | POA: Diagnosis not present

## 2016-08-09 DIAGNOSIS — I4891 Unspecified atrial fibrillation: Secondary | ICD-10-CM | POA: Diagnosis not present

## 2016-08-09 DIAGNOSIS — I2510 Atherosclerotic heart disease of native coronary artery without angina pectoris: Secondary | ICD-10-CM | POA: Diagnosis not present

## 2016-08-09 DIAGNOSIS — E119 Type 2 diabetes mellitus without complications: Secondary | ICD-10-CM | POA: Diagnosis not present

## 2016-08-20 ENCOUNTER — Telehealth: Payer: Self-pay | Admitting: Cardiovascular Disease

## 2016-08-20 NOTE — Telephone Encounter (Signed)
New Message     Pt is returning your call about lab results , her pcp already gave her the results

## 2016-09-04 NOTE — Telephone Encounter (Signed)
Acknowledged. MCr has made no changes based on these test results.

## 2016-09-12 DIAGNOSIS — I480 Paroxysmal atrial fibrillation: Secondary | ICD-10-CM | POA: Diagnosis not present

## 2016-09-12 DIAGNOSIS — E118 Type 2 diabetes mellitus with unspecified complications: Secondary | ICD-10-CM | POA: Diagnosis not present

## 2016-09-12 DIAGNOSIS — I10 Essential (primary) hypertension: Secondary | ICD-10-CM | POA: Diagnosis not present

## 2016-09-12 DIAGNOSIS — M545 Low back pain: Secondary | ICD-10-CM | POA: Diagnosis not present

## 2016-09-12 DIAGNOSIS — M5442 Lumbago with sciatica, left side: Secondary | ICD-10-CM | POA: Diagnosis not present

## 2016-09-13 ENCOUNTER — Other Ambulatory Visit: Payer: Self-pay | Admitting: Internal Medicine

## 2016-09-13 DIAGNOSIS — M5442 Lumbago with sciatica, left side: Secondary | ICD-10-CM

## 2016-09-19 ENCOUNTER — Ambulatory Visit
Admission: RE | Admit: 2016-09-19 | Discharge: 2016-09-19 | Disposition: A | Discharge status: Home / Self Care | Payer: Medicare Other | Source: Ambulatory Visit | Attending: Internal Medicine | Admitting: Internal Medicine

## 2016-09-19 DIAGNOSIS — R935 Abnormal findings on diagnostic imaging of other abdominal regions, including retroperitoneum: Secondary | ICD-10-CM | POA: Diagnosis not present

## 2016-09-19 DIAGNOSIS — M5442 Lumbago with sciatica, left side: Secondary | ICD-10-CM

## 2016-09-19 MED ORDER — IOPAMIDOL (ISOVUE-300) INJECTION 61%
100.0000 mL | Freq: Once | INTRAVENOUS | Status: AC | PRN
  Administered 2016-09-19: 100 mL via INTRAVENOUS

## 2016-09-26 DIAGNOSIS — R1032 Left lower quadrant pain: Secondary | ICD-10-CM | POA: Diagnosis not present

## 2016-09-26 DIAGNOSIS — E119 Type 2 diabetes mellitus without complications: Secondary | ICD-10-CM | POA: Diagnosis not present

## 2016-09-26 DIAGNOSIS — K5792 Diverticulitis of intestine, part unspecified, without perforation or abscess without bleeding: Secondary | ICD-10-CM | POA: Diagnosis not present

## 2016-09-26 DIAGNOSIS — I10 Essential (primary) hypertension: Secondary | ICD-10-CM | POA: Diagnosis not present

## 2016-10-07 ENCOUNTER — Ambulatory Visit (INDEPENDENT_AMBULATORY_CARE_PROVIDER_SITE_OTHER): Payer: Medicare Other | Admitting: *Deleted

## 2016-10-07 DIAGNOSIS — I441 Atrioventricular block, second degree: Secondary | ICD-10-CM | POA: Diagnosis not present

## 2016-10-08 NOTE — Progress Notes (Signed)
Remote pacemaker transmission.   

## 2016-10-09 LAB — CUP PACEART REMOTE DEVICE CHECK
Battery Remaining Longevity: 64 mo
Battery Voltage: 2.89 V
Brady Statistic AP VP Percent: 38 %
Brady Statistic AS VS Percent: 1 %
Brady Statistic RV Percent Paced: 41 %
Implantable Lead Implant Date: 20120702
Implantable Lead Location: 753860
Implantable Pulse Generator Implant Date: 20120702
Lead Channel Impedance Value: 1175 Ohm
Lead Channel Impedance Value: 380 Ohm
Lead Channel Pacing Threshold Amplitude: 0.625 V
Lead Channel Pacing Threshold Pulse Width: 0.8 ms
Lead Channel Setting Pacing Amplitude: 2.5 V
Lead Channel Setting Pacing Pulse Width: 0.8 ms
Lead Channel Setting Sensing Sensitivity: 2 mV
MDC IDC LEAD IMPLANT DT: 20120702
MDC IDC LEAD LOCATION: 753859
MDC IDC MSMT BATTERY REMAINING PERCENTAGE: 57 %
MDC IDC MSMT LEADCHNL RA PACING THRESHOLD PULSEWIDTH: 0.4 ms
MDC IDC MSMT LEADCHNL RA SENSING INTR AMPL: 2.5 mV
MDC IDC MSMT LEADCHNL RV PACING THRESHOLD AMPLITUDE: 1 V
MDC IDC MSMT LEADCHNL RV SENSING INTR AMPL: 5.4 mV
MDC IDC SESS DTM: 20180507092255
MDC IDC SET LEADCHNL RA PACING AMPLITUDE: 1.625
MDC IDC STAT BRADY AP VS PERCENT: 61 %
MDC IDC STAT BRADY AS VP PERCENT: 1 %
MDC IDC STAT BRADY RA PERCENT PACED: 95 %
Pulse Gen Model: 2210
Pulse Gen Serial Number: 7255453

## 2016-10-11 ENCOUNTER — Encounter: Payer: Self-pay | Admitting: Cardiology

## 2016-10-15 DIAGNOSIS — M4726 Other spondylosis with radiculopathy, lumbar region: Secondary | ICD-10-CM | POA: Diagnosis not present

## 2016-10-15 DIAGNOSIS — M419 Scoliosis, unspecified: Secondary | ICD-10-CM | POA: Diagnosis not present

## 2016-10-15 DIAGNOSIS — M47816 Spondylosis without myelopathy or radiculopathy, lumbar region: Secondary | ICD-10-CM | POA: Diagnosis not present

## 2016-10-15 DIAGNOSIS — Z6830 Body mass index (BMI) 30.0-30.9, adult: Secondary | ICD-10-CM | POA: Diagnosis not present

## 2016-10-15 DIAGNOSIS — M5136 Other intervertebral disc degeneration, lumbar region: Secondary | ICD-10-CM | POA: Diagnosis not present

## 2016-10-25 DIAGNOSIS — R110 Nausea: Secondary | ICD-10-CM | POA: Diagnosis not present

## 2016-10-30 DIAGNOSIS — I10 Essential (primary) hypertension: Secondary | ICD-10-CM | POA: Diagnosis not present

## 2016-10-30 DIAGNOSIS — E789 Disorder of lipoprotein metabolism, unspecified: Secondary | ICD-10-CM | POA: Diagnosis not present

## 2016-10-30 DIAGNOSIS — E119 Type 2 diabetes mellitus without complications: Secondary | ICD-10-CM | POA: Diagnosis not present

## 2016-11-06 ENCOUNTER — Other Ambulatory Visit: Payer: Self-pay | Admitting: Cardiovascular Disease

## 2016-12-24 ENCOUNTER — Other Ambulatory Visit: Payer: Self-pay | Admitting: Cardiovascular Disease

## 2016-12-26 DIAGNOSIS — K219 Gastro-esophageal reflux disease without esophagitis: Secondary | ICD-10-CM | POA: Diagnosis not present

## 2016-12-26 DIAGNOSIS — Z86010 Personal history of colonic polyps: Secondary | ICD-10-CM | POA: Diagnosis not present

## 2016-12-26 DIAGNOSIS — Z800 Family history of malignant neoplasm of digestive organs: Secondary | ICD-10-CM | POA: Diagnosis not present

## 2016-12-26 DIAGNOSIS — R194 Change in bowel habit: Secondary | ICD-10-CM | POA: Diagnosis not present

## 2017-01-06 ENCOUNTER — Ambulatory Visit (INDEPENDENT_AMBULATORY_CARE_PROVIDER_SITE_OTHER): Payer: Medicare Other | Admitting: *Deleted

## 2017-01-06 DIAGNOSIS — I441 Atrioventricular block, second degree: Secondary | ICD-10-CM | POA: Diagnosis not present

## 2017-01-07 NOTE — Progress Notes (Signed)
Remote pacemaker transmission.   

## 2017-01-08 ENCOUNTER — Encounter: Payer: Self-pay | Admitting: Cardiology

## 2017-01-10 LAB — CUP PACEART REMOTE DEVICE CHECK
Battery Remaining Percentage: 51 %
Brady Statistic AP VP Percent: 38 %
Brady Statistic AP VS Percent: 61 %
Brady Statistic AS VP Percent: 1 %
Brady Statistic AS VS Percent: 1 %
Brady Statistic RV Percent Paced: 41 %
Implantable Lead Implant Date: 20120702
Implantable Lead Location: 753860
Lead Channel Impedance Value: 1275 Ohm
Lead Channel Pacing Threshold Amplitude: 1 V
Lead Channel Pacing Threshold Pulse Width: 0.8 ms
Lead Channel Sensing Intrinsic Amplitude: 2.5 mV
Lead Channel Sensing Intrinsic Amplitude: 5.1 mV
Lead Channel Setting Pacing Amplitude: 2.5 V
Lead Channel Setting Pacing Pulse Width: 0.8 ms
Lead Channel Setting Sensing Sensitivity: 2 mV
MDC IDC LEAD IMPLANT DT: 20120702
MDC IDC LEAD LOCATION: 753859
MDC IDC MSMT BATTERY REMAINING LONGEVITY: 58 mo
MDC IDC MSMT BATTERY VOLTAGE: 2.87 V
MDC IDC MSMT LEADCHNL RA IMPEDANCE VALUE: 390 Ohm
MDC IDC MSMT LEADCHNL RA PACING THRESHOLD AMPLITUDE: 0.625 V
MDC IDC MSMT LEADCHNL RA PACING THRESHOLD PULSEWIDTH: 0.4 ms
MDC IDC PG IMPLANT DT: 20120702
MDC IDC PG SERIAL: 7255453
MDC IDC SESS DTM: 20180806060007
MDC IDC SET LEADCHNL RA PACING AMPLITUDE: 1.625
MDC IDC STAT BRADY RA PERCENT PACED: 94 %

## 2017-01-15 ENCOUNTER — Other Ambulatory Visit: Payer: Self-pay | Admitting: Cardiovascular Disease

## 2017-01-16 NOTE — Telephone Encounter (Signed)
REFILL 

## 2017-01-28 ENCOUNTER — Other Ambulatory Visit: Payer: Self-pay | Admitting: Cardiovascular Disease

## 2017-01-28 NOTE — Telephone Encounter (Signed)
°*  STAT* If patient is at the pharmacy, call can be transferred to refill team.   1. Which medications need to be refilled? (please list name of each medication and dose if known) Furosemide -pt made first available appt in December  2. Which pharmacy/location (including street and city if local pharmacy) is medication to be sent to?[phone] 3. Do they need a 30 day or 90 day supply? 90 days enough until office visit in December

## 2017-02-20 DIAGNOSIS — M26629 Arthralgia of temporomandibular joint, unspecified side: Secondary | ICD-10-CM | POA: Diagnosis not present

## 2017-02-20 DIAGNOSIS — E042 Nontoxic multinodular goiter: Secondary | ICD-10-CM | POA: Diagnosis not present

## 2017-02-20 HISTORY — DX: Nontoxic multinodular goiter: E04.2

## 2017-02-26 ENCOUNTER — Ambulatory Visit (HOSPITAL_COMMUNITY)
Admission: EM | Admit: 2017-02-26 | Discharge: 2017-02-26 | Disposition: A | Discharge status: Home / Self Care | Payer: Medicare Other | Attending: Family Medicine | Admitting: Family Medicine

## 2017-02-26 ENCOUNTER — Encounter (HOSPITAL_COMMUNITY): Payer: Self-pay | Admitting: Emergency Medicine

## 2017-02-26 DIAGNOSIS — N3090 Cystitis, unspecified without hematuria: Secondary | ICD-10-CM

## 2017-02-26 LAB — POCT URINALYSIS DIP (DEVICE)
BILIRUBIN URINE: NEGATIVE
GLUCOSE, UA: NEGATIVE mg/dL
Ketones, ur: NEGATIVE mg/dL
NITRITE: NEGATIVE
Protein, ur: 30 mg/dL — AB
Specific Gravity, Urine: 1.01 (ref 1.005–1.030)
Urobilinogen, UA: 0.2 mg/dL (ref 0.0–1.0)
pH: 6 (ref 5.0–8.0)

## 2017-02-26 MED ORDER — SULFAMETHOXAZOLE-TRIMETHOPRIM 800-160 MG PO TABS: 1.0000 | ORAL_TABLET | Freq: Two times a day (BID) | ORAL | 0 refills | Status: DC

## 2017-02-26 NOTE — ED Triage Notes (Signed)
PT reports onset of hematuria this morning. PT reports urinary pressure as well.

## 2017-02-27 NOTE — ED Provider Notes (Signed)
[place]    ASSESSMENT & PLAN:  1. Cystitis     Meds ordered this encounter  Medications  . sulfamethoxazole-trimethoprim (BACTRIM DS,SEPTRA DS) 800-160 MG tablet    Sig: Take 1 tablet by mouth 2 (two) times daily.    Dispense:  6 tablet    Refill:  0   Urine culture sent. Will notify patient when results available. Will follow up with her PCP or here if not showing improvement over the next 48 hours, sooner if needed.  Outlined signs and symptoms indicating need for more acute intervention. Patient verbalized understanding. After Visit Summary given.  SUBJECTIVE:  Hailey [name] is a 71 y.o. female who complains of urinary frequency, urgency and dysuria beginning today. Fairly abrupt onset. No flank pain, fever, chills, abnormal vaginal discharge or bleeding. Hematuria: present.  Normal PO intake. No flank or abdominal pain. No self treatment. Does not report h/o UTI.  LMP: No LMP recorded. Patient has had a hysterectomy.  ROS: As in HPI.  OBJECTIVE:  Vitals:   02/26/17 1922 02/26/17 1923  BP:  (!) 138/57  Pulse:  85  Resp:  16  Temp:  98.4 F (36.9 C)  TempSrc:  Oral  SpO2:  98%  Weight: 142 lb (64.4 kg)   Height: 5' (1.524 m)    Appears well, in no apparent distress. Abdomen is soft without tenderness, guarding, mass, rebound or organomegaly. No CVA tenderness or inguinal adenopathy noted.  Labs Reviewed  POCT URINALYSIS DIP (DEVICE) - Abnormal; Notable for the following:       Result Value   Hgb urine dipstick LARGE (*)    Protein, ur 30 (*)    Leukocytes, UA LARGE (*)    All other components within normal limits  URINE CULTURE    Allergies  Allergen Reactions  . Ketorolac Nausea And Vomiting and Other (See Comments)    "pt told to never take this med"  . Codeine Nausea And Vomiting  . Demerol Nausea And Vomiting  . Multaq [Dronedarone] Other (See Comments)    Increased fluid retention    Past Medical History:  Diagnosis Date  .  Acute on chronic diastolic CHF (congestive heart failure), NYHA class 1 ([place]) 04/29/2013  . Allergic rhinitis   . Brady-tachy syndrome (HCC)    s/p STJ dual chamber PPM by Dr Sallyanne Kuster  . CAD (coronary artery disease)    70% RCA stenosis, treated medically  . Diabetes mellitus ([place])   . DJD (degenerative joint disease)   . Dysrhythmia    Paroxysmal Atrial Fib.  . Hypertension   . Hyperthyroidism   . Paroxysmal atrial fibrillation (HCC)   . Polio    as a child  . PONV (postoperative nausea and vomiting)   . Presence of permanent cardiac pacemaker    Social History   Social History  . Marital status: Married    Spouse name: [name]  . Number of children: N/A  . Years of education: N/A   Occupational History  . housewife    Social History Main Topics  . Smoking status: Never Smoker  . Smokeless tobacco: Never Used  . Alcohol use No  . Drug use: No  . Sexual activity: Not Currently    Birth control/ protection: Abstinence   Other Topics Concern  . Not on file   Social History Narrative   Lives in [place].  Retired.   Family History  Problem Relation Age of Onset  . Heart disease Sister   .  Breast cancer Sister   . Breast cancer Sister        Vanessa Kick, MD 02/27/17 0930

## 2017-03-06 ENCOUNTER — Telehealth (HOSPITAL_COMMUNITY): Payer: Self-pay | Admitting: Emergency Medicine

## 2017-03-06 NOTE — Telephone Encounter (Signed)
Ann from Dr Julianne Rice office called on patient's behalf.  Patient wanting lab results.  Casandra Doffing I would contact patient.

## 2017-03-06 NOTE — Telephone Encounter (Signed)
Reviewed medical record and discussed with dr Joseph Art.    Patient positive for uti.  Treated appropriately.  Patient reports she has finished antibiotic.  Patient is feeling better, no bleeding.  Patient admits to continued abdominal pressure.  Patient states she is to see Dr Maudie Mercury on 10/18.   Based on conversation with Dr Joseph Art, instructed patient to follow up with pcp prior to 10/18 especially since she continues to have "pressure" sensation.  Also, offered to see patient at urgent care if unable to get in pcp soon.  Patient agreed and phone call ended.

## 2017-03-20 DIAGNOSIS — E119 Type 2 diabetes mellitus without complications: Secondary | ICD-10-CM | POA: Diagnosis not present

## 2017-03-20 DIAGNOSIS — E0590 Thyrotoxicosis, unspecified without thyrotoxic crisis or storm: Secondary | ICD-10-CM | POA: Diagnosis not present

## 2017-03-20 DIAGNOSIS — M810 Age-related osteoporosis without current pathological fracture: Secondary | ICD-10-CM | POA: Diagnosis not present

## 2017-03-20 DIAGNOSIS — I10 Essential (primary) hypertension: Secondary | ICD-10-CM | POA: Diagnosis not present

## 2017-03-27 DIAGNOSIS — E789 Disorder of lipoprotein metabolism, unspecified: Secondary | ICD-10-CM | POA: Diagnosis not present

## 2017-03-27 DIAGNOSIS — I10 Essential (primary) hypertension: Secondary | ICD-10-CM | POA: Diagnosis not present

## 2017-03-27 DIAGNOSIS — E119 Type 2 diabetes mellitus without complications: Secondary | ICD-10-CM | POA: Diagnosis not present

## 2017-03-27 DIAGNOSIS — Z23 Encounter for immunization: Secondary | ICD-10-CM | POA: Diagnosis not present

## 2017-03-27 DIAGNOSIS — E0590 Thyrotoxicosis, unspecified without thyrotoxic crisis or storm: Secondary | ICD-10-CM | POA: Diagnosis not present

## 2017-04-02 DIAGNOSIS — R05 Cough: Secondary | ICD-10-CM | POA: Diagnosis not present

## 2017-04-07 ENCOUNTER — Ambulatory Visit (INDEPENDENT_AMBULATORY_CARE_PROVIDER_SITE_OTHER): Payer: Medicare Other | Admitting: *Deleted

## 2017-04-07 DIAGNOSIS — I441 Atrioventricular block, second degree: Secondary | ICD-10-CM

## 2017-04-08 ENCOUNTER — Other Ambulatory Visit: Payer: Self-pay | Admitting: Internal Medicine

## 2017-04-08 ENCOUNTER — Other Ambulatory Visit: Payer: Self-pay | Admitting: Cardiovascular Disease

## 2017-04-08 ENCOUNTER — Telehealth: Payer: Self-pay | Admitting: Cardiovascular Disease

## 2017-04-08 NOTE — Progress Notes (Signed)
Remote pacemaker transmission.   

## 2017-04-08 NOTE — Telephone Encounter (Signed)
New Message      *STAT* If patient is at the pharmacy, call can be transferred to refill team.   1. Which medications need to be refilled? (please list name of each medication and dose if known)  pradaxa  2. Which pharmacy/location (including street and city if local pharmacy) is medication to be sent to? [facility] [place]  3. Do they need a 30 day or 90 day supply?  [place]

## 2017-04-09 ENCOUNTER — Encounter: Payer: Self-pay | Admitting: Cardiology

## 2017-04-09 MED ORDER — DABIGATRAN ETEXILATE MESYLATE 150 MG PO CAPS: 150.0000 mg | ORAL_CAPSULE | Freq: Two times a day (BID) | ORAL | 0 refills | Status: DC

## 2017-04-16 DIAGNOSIS — Z01419 Encounter for gynecological examination (general) (routine) without abnormal findings: Secondary | ICD-10-CM | POA: Diagnosis not present

## 2017-04-16 DIAGNOSIS — Z6828 Body mass index (BMI) 28.0-28.9, adult: Secondary | ICD-10-CM | POA: Diagnosis not present

## 2017-04-16 DIAGNOSIS — Z1231 Encounter for screening mammogram for malignant neoplasm of breast: Secondary | ICD-10-CM | POA: Diagnosis not present

## 2017-04-16 HISTORY — PX: DIAGNOSTIC MAMMOGRAM: HXRAD719

## 2017-04-16 LAB — HM MAMMOGRAPHY: HM Mammogram: NORMAL (ref 0–4)

## 2017-04-22 LAB — CUP PACEART REMOTE DEVICE CHECK
Battery Remaining Percentage: 51 %
Brady Statistic AP VS Percent: 62 %
Brady Statistic RV Percent Paced: 41 %
Date Time Interrogation Session: 20181105062725
Implantable Lead Implant Date: 20120702
Implantable Lead Implant Date: 20120702
Implantable Lead Location: 753860
Implantable Pulse Generator Implant Date: 20120702
Lead Channel Pacing Threshold Amplitude: 1 V
Lead Channel Sensing Intrinsic Amplitude: 2.3 mV
Lead Channel Sensing Intrinsic Amplitude: 5 mV
Lead Channel Setting Pacing Amplitude: 1.625
Lead Channel Setting Pacing Pulse Width: 0.8 ms
Lead Channel Setting Sensing Sensitivity: 2 mV
MDC IDC LEAD LOCATION: 753859
MDC IDC MSMT BATTERY REMAINING LONGEVITY: 57 mo
MDC IDC MSMT BATTERY VOLTAGE: 2.87 V
MDC IDC MSMT LEADCHNL RA IMPEDANCE VALUE: 380 Ohm
MDC IDC MSMT LEADCHNL RA PACING THRESHOLD AMPLITUDE: 0.625 V
MDC IDC MSMT LEADCHNL RA PACING THRESHOLD PULSEWIDTH: 0.4 ms
MDC IDC MSMT LEADCHNL RV IMPEDANCE VALUE: 1325 Ohm
MDC IDC MSMT LEADCHNL RV PACING THRESHOLD PULSEWIDTH: 0.8 ms
MDC IDC SET LEADCHNL RV PACING AMPLITUDE: 2.5 V
MDC IDC STAT BRADY AP VP PERCENT: 38 %
MDC IDC STAT BRADY AS VP PERCENT: 1 %
MDC IDC STAT BRADY AS VS PERCENT: 1 %
MDC IDC STAT BRADY RA PERCENT PACED: 94 %
Pulse Gen Model: 2210
Pulse Gen Serial Number: 7255453

## 2017-05-07 ENCOUNTER — Encounter: Payer: Medicare Other | Admitting: Cardiovascular Disease

## 2017-05-16 ENCOUNTER — Encounter: Payer: Medicare Other | Admitting: Cardiovascular Disease

## 2017-05-26 ENCOUNTER — Ambulatory Visit (INDEPENDENT_AMBULATORY_CARE_PROVIDER_SITE_OTHER): Payer: Medicare Other | Admitting: Cardiovascular Disease

## 2017-05-26 ENCOUNTER — Encounter: Payer: Self-pay | Admitting: Cardiovascular Disease

## 2017-05-26 VITALS — BP 124/65 | HR 70 | Ht 59.0 in | Wt 139.0 lb

## 2017-05-26 DIAGNOSIS — Z79899 Other long term (current) drug therapy: Secondary | ICD-10-CM | POA: Diagnosis not present

## 2017-05-26 DIAGNOSIS — I2510 Atherosclerotic heart disease of native coronary artery without angina pectoris: Secondary | ICD-10-CM | POA: Diagnosis not present

## 2017-05-26 DIAGNOSIS — R0602 Shortness of breath: Secondary | ICD-10-CM

## 2017-05-26 DIAGNOSIS — T82110A Breakdown (mechanical) of cardiac electrode, initial encounter: Secondary | ICD-10-CM | POA: Insufficient documentation

## 2017-05-26 DIAGNOSIS — Z950 Presence of cardiac pacemaker: Secondary | ICD-10-CM | POA: Diagnosis not present

## 2017-05-26 DIAGNOSIS — R001 Bradycardia, unspecified: Secondary | ICD-10-CM

## 2017-05-26 DIAGNOSIS — I5033 Acute on chronic diastolic (congestive) heart failure: Secondary | ICD-10-CM

## 2017-05-26 DIAGNOSIS — I441 Atrioventricular block, second degree: Secondary | ICD-10-CM

## 2017-05-26 DIAGNOSIS — Z7901 Long term (current) use of anticoagulants: Secondary | ICD-10-CM | POA: Diagnosis not present

## 2017-05-26 DIAGNOSIS — I480 Paroxysmal atrial fibrillation: Secondary | ICD-10-CM | POA: Diagnosis not present

## 2017-05-26 MED ORDER — POTASSIUM CHLORIDE CRYS ER 20 MEQ PO TBCR: 20.0000 meq | EXTENDED_RELEASE_TABLET | Freq: Two times a day (BID) | ORAL | 3 refills | Status: DC

## 2017-05-26 MED ORDER — FUROSEMIDE 80 MG PO TABS: 80.0000 mg | ORAL_TABLET | Freq: Every day | ORAL | 3 refills | Status: DC

## 2017-05-26 NOTE — Patient Instructions (Signed)
Medication Instructions: Dr Sallyanne Kuster has recommended making the following medication changes: 1. INCREASE Furosemide (Lasix) to 80 mg daily 2. START Potassium 20 mEq - take 1 tablet by mouth TWICE daily  Labwork: Your physician recommends that you return for lab work when you return for your stress test.  Testing/Procedures: 1. [place] physician has requested that you have a lexiscan myoview. For further information please visit [email or web address]. Please follow instruction sheet, as given.  Follow-up: Dr Sallyanne Kuster recommends that you schedule a follow-up appointment first available.  If you need a refill on your cardiac medications before your next appointment, please call your pharmacy.

## 2017-05-26 NOTE — Progress Notes (Signed)
Patient ID: Hailey [name], female   DOB: 1945/10/25, 71 y.o.   MRN: [id number]    Cardiology Office Note    Date:  05/26/2017   ID:  Shana, Zavaleta [birth year], MRN [id number]  PCP:  Jani Gravel, MD  Cardiologist:  Thompson Grayer, MD;  Sanda Klein, MD   Chief Complaint  Patient presents with  . Follow-up    fatigue    History of Present Illness:  Hailey [name] is a 71 y.o. female with paroxysmal atrial fibrillation (s/p RF ablation procedures),s/p dual-chamber permanent pacemaker (St. Jude Accent 2012 for tachycardia-bradycardia syndrome and 2:1 atrioventricular block), CAD (70% stenosis in the mid right coronary artery without ischemia on nuclear perfusion imaging in 2012 and March 2016), chronic diastolic heart failure (history of acute decompensation on Multaq), type 2 diabetes mellitus, hypertension, good lipid profile without therapy, hyperthyroidism (currently euthyroid on propylthiouracil).  She has had decreased exertional tolerance due to dyspnea for several months now. She finds it hard to climb stairs. She denies any chest discomfort either at rest or with exertion. She sleeps on 3-4 pillows but has been doing this now for a couple of years. She has chronic lower extremity edema. She believes it is worse in her right extremity, but both legs appear swollen to me today.  She actually weighs substantially less than she did over the previous years having lost roughly 5-7 pounds.  She is unaware of palpitations even though her burden of atrial fibrillation and atrial flutter is around 12%. She's never really been aware of them. When she presented with an acute coronary event years ago her primary symptom was dyspnea. She has never really had angina.  Not had any bleeding problems on chronic anticoagulation with Pradaxa and denies any focal neurological events. She has a steady gait and has not had falls.  Her presenting electrocardiogram is quite puzzling. There are frequent  sharp spikes consistent with pacemaker spikes but the rate is unusually fast at about 100 bpm with an AV delay of 275 ms. It appears that there is appropriate atrial pacing but that there is loss of ventricular sensing and loss of ventricular capture. Because atrial pacing is occurring at about 100 bpm, the patient has native AV conduction second-degree block, Mobitz type I with a resulting ventricular rate of about 70 bpm.  Her pacemaker is a Buyer, retail implanted in 2012. It has roughly 4.0-4.5 years of estimated longevity. The atrial lead has auto capture on with a good threshold 0.625 at 0.4 ms. Atrial sensing automatically shows 2.9 mV. In the past she has been poorly tolerant of atrial sensed tests when done in the office and she has 93% atrial pacing. Lead impedance is normal at 380 ohms.  The ventricular lead shows sensed R waves of 5.4 mV which is lower than a year ago 8.4 mV and there has been a marked increase in reticular lead impedance from 380 ohms to 1002 and 75 ohms. Ventricular auto capture was not turned on. I did not perform manual testing of ventricular lead threshold today. For what is worth, ventricular pacing is recorded at 41% but this may be erroneous.  The overall burden of atrial fibrillation is around 12% which is her historical average, although there may be some recent increase in atrial fibrillation compared with a year ago. I'm not sure how reliable ventricular rate measurements are a stone the abnormality seen on the ventricular lead.  Past Medical History:  Diagnosis Date  . Acute  on chronic diastolic CHF (congestive heart failure), NYHA class 1 ([place]) 04/29/2013  . Allergic rhinitis   . Brady-tachy syndrome (HCC)    s/p STJ dual chamber PPM by Dr Sallyanne Kuster  . CAD (coronary artery disease)    70% RCA stenosis, treated medically  . Diabetes mellitus ([place])   . DJD (degenerative joint disease)   . Dysrhythmia    Paroxysmal Atrial Fib.  . Hypertension   .  Hyperthyroidism   . Paroxysmal atrial fibrillation (HCC)   . Polio    as a child  . PONV (postoperative nausea and vomiting)   . Presence of permanent cardiac pacemaker     Past Surgical History:  Procedure Laterality Date  . arm surgery d/t fx Right 96  . ATRIAL FIBRILLATION ABLATION  07/31/2012; 08-10-13   PVI by Dr Rayann Heman  . ATRIAL FIBRILLATION ABLATION N/A 07/31/2012   Procedure: ATRIAL FIBRILLATION ABLATION;  Surgeon: Thompson Grayer, MD;  Location[facility] C[facility] LAB;  Service: Cardiovascular;  Laterality: N/A;  . ATRIAL FIBRILLATION ABLATION N/A 08/10/2013   Procedure: ATRIAL FIBRILLATION ABLATION;  Surgeon: Coralyn Mark, MD;  Location: [facility] CATH LAB;  Service: Cardiovascular;  Laterality: N/A;  . CARDIAC CATHETERIZATION  12/03/2010   single vessel mid RCA  . CHOLECYSTECTOMY  1994  . COLONOSCOPY WITH PROPOFOL N/A 03/21/2015   Procedure: COLONOSCOPY WITH PROPOFOL;  Surgeon: Juanita Craver, MD;  Location: [facility] ENDOSCOPY;  Service: Endoscopy;  Laterality: N/A;  . LUMBAR LAMINECTOMY/DECOMPRESSION MICRODISCECTOMY Right 12/23/2012   Procedure: Right L5-S1 Laminotomy for resection of synovial cyst;  Surgeon: Hosie Spangle, MD;  Location: [facility] [facility] NEURO ORS;  Service: Neurosurgery;  Laterality: Right;  Right Lumbar five-sacral one Laminotomy for resection of synovial cyst  . NM MYOCAR PERF WALL MOTION  12/20/2010   normal  . PACEMAKER INSERTION  12/03/10   SJM Accent DR RF implanted by Dr Sallyanne Kuster  . TEE WITHOUT CARDIOVERSION N/A 07/30/2012   Procedure: TRANSESOPHAGEAL ECHOCARDIOGRAM (TEE);  Surgeon: Sanda Klein, MD;  Location: [facility] ENDOSCOPY;  Service: Cardiovascular;  Laterality: N/A;  h&p in file-[place]  . TEE WITHOUT CARDIOVERSION N/A 08/09/2013   Procedure: TRANSESOPHAGEAL ECHOCARDIOGRAM (TEE);  Surgeon: Lelon Perla, MD;  Location[facility] ENDOSCOPY;  Service: Cardiovascular;  Laterality: N/A;  . TOTAL ABDOMINAL HYSTERECTOMY  1981    Current Medications: Outpatient Medications Prior to Visit  Medication  Sig Dispense Refill  . CALCIUM CARBONATE PO Take 600 mg by mouth daily.    . Cholecalciferol (VITAMIN D3) 1000 UNITS CAPS Take 1 capsule by mouth daily.     . Cyanocobalamin (VITAMIN B12 PO) (300 MG) Dissolve one under the tongue each morning    . cyclobenzaprine (FLEXERIL) 10 MG tablet Take half tab as needed prn    . dabigatran (PRADAXA) 150 MG CAPS capsule Take 1 capsule (150 mg total) 2 (two) times daily by mouth. 60 capsule 0  . DILT-XR 180 MG 24 hr capsule TAKE 1 CAPSULE BY MOUTH ONCE DAILY 90 capsule 0  . loperamide (IMODIUM A-D) 2 MG tablet Take 2 mg by mouth daily.     Marland Kitchen losartan (COZAAR) 25 MG tablet Take 1 tablet by mouth daily.  0  . metoprolol (LOPRESSOR) 50 MG tablet TAKE ONE TABLET BY MOUTH TWICE DAILY 60 tablet 11  . OVER THE COUNTER MEDICATION Take 1 tablet by mouth daily. [employer] Brand allergy medication    . oxybutynin (DITROPAN) 5 MG tablet Take 1 tablet by mouth daily.  4  . pantoprazole (PROTONIX) 40 MG tablet Take 40 mg by  mouth daily.    Marland Kitchen propylthiouracil (PTU) 50 MG tablet Take 50-100 mg by mouth daily. Takes 2 tablets every morning and 1 tablet every night    . saxagliptin HCl (ONGLYZA) 5 MG TABS tablet Take 5 mg by mouth daily.    Marland Kitchen sulfamethoxazole-trimethoprim (BACTRIM DS,SEPTRA DS) 800-160 MG tablet Take 1 tablet by mouth 2 (two) times daily. 6 tablet 0  . furosemide (LASIX) 40 MG tablet TAKE 1 TABLET BY MOUTH ONCE DAILY **NEEDS  APPOINTMENT  FOR  REFILLS** 30 tablet 4  . valsartan (DIOVAN) 80 MG tablet Take 80 mg by mouth every evening.      No facility-administered medications prior to visit.      Allergies:   Ketorolac; Codeine; Demerol; and Multaq [dronedarone]   Social History   Socioeconomic History  . Marital status: Married    Spouse name: [name]  . Number of children: None  . Years of education: None  . Highest education level: None  Social Needs  . Financial resource strain: None  . Food insecurity - worry: None  . Food insecurity -  inability: None  . Transportation needs - medical: None  . Transportation needs - non-medical: None  Occupational History  . Occupation: housewife  Tobacco Use  . Smoking status: Never Smoker  . Smokeless tobacco: Never Used  Substance and Sexual Activity  . Alcohol use: No  . Drug use: No  . Sexual activity: Not Currently    Birth control/protection: Abstinence  Other Topics Concern  . None  Social History Narrative   Lives in [place].  Retired.     Family History:  The patient's family history includes Breast cancer in her sister and sister; Heart disease in her sister.   ROS:   Please see the history of present illness.    ROS All other systems reviewed and are negative.   PHYSICAL EXAM:   VS:  BP 124/65   Pulse 70   Ht 4\' 11"  (1.499 m)   Wt 139 lb (63 kg)   BMI 28.07 kg/m    GEN: Well nourished, well developed, in no acute distress  HEENT: normal  Neck: no JVD, carotid bruits, or masses Cardiac: RRR; no murmurs, rubs, or gallops,no edema , healthy subclavian pacemaker site Respiratory:  clear to auscultation bilaterally, normal work of breathing GI: soft, nontender, nondistended, + BS MS: no deformity or atrophy  Skin: warm and dry, no rash Neuro:  Alert and Oriented x 3, Strength and sensation are intact Psych: euthymic mood, full affect  Wt Readings from Last 3 Encounters:  05/26/17 139 lb (63 kg)  02/26/17 142 lb (64.4 kg)  04/05/16 145 lb (65.8 kg)      Studies/Labs Reviewed:   EKG:  EKG is ordered today.  The ekg ordered today demonstrates Atrial paced ventricular paced rhythm at 107 bpm (sensor driven?). Her heart rate was about 70 by the time I examined her. She does not have atrial overdrive pacing turned on.  Lipid Panel    Component Value Date/Time   CHOL 94 11/24/2010 0837   TRIG 51 11/24/2010 0837   HDL 53 11/24/2010 0837   CHOLHDL 1.8 11/24/2010 0837   VLDL 10 11/24/2010 0837   LDLCALC 31 11/24/2010 0837    Additional studies/  records that were reviewed today include:  Office visit notes from her last appointment with Dr. Thompson Grayer    ASSESSMENT:    1. Acute on chronic diastolic congestive heart failure (HCC)   2. Paroxysmal  atrial fibrillation ([place])   3. Long term current use of anticoagulant   4. Symptomatic sinus bradycardia   5. Second degree AV block   6. Pacemaker   7. Pacemaker lead failure, initial encounter   8. Coronary artery disease involving native coronary artery of native heart without angina pectoris   9. Shortness of breath   10. Medication management      PLAN:  In order of problems listed above:  1. CHF: She now appears to have heart failure exacerbation. I'm not sure whether this is related to increased frequency of second degree AV block due to her ventricular lead malfunction. History of temporary decompensation during treatment with Multaq.  Will increase furosemide to 80 mg daily and add a potassium supplement. Most recent echo performed in June 2017 showed normal left ventricular ejection fraction and wall motion. 2. AFib: The overall burden is similar to her overall historian trend at 12 %. I can't say for certain whether her episodes of atrial fibrillation are well rate controlled. She is not having any problems with anticoagulation..CHADSVasc at least 69 (age, CAD, history of CHF). 3. Anticoagulation: Well-tolerated.  4. SSS: She has close to 100% atrial pacing and atrial heart rate histogram distribution appears to be favorable. 5. 2nd deg AVB: It seems that her ventricular lead may be malfunctioning. I did not manually check pacing threshold in the office today, with the current settings she appears to be losing capture. Her previous ventricular pacing threshold was relatively high at 1 V at 0.8 ms. I suspect it has worsened further.  6. PPM: Seems to have ventricular lead malfunction with both under sensing and loss of capture as well as increase in impedance. We'll bring her  back in in a couple of days for retesting of the ventricular lead. She will probably need ventricular lead revision. 7. CAD: No angina, but she never really had chest discomfort in the past.. Last nuclear perfusion study showed low risk findings, but this was over 2 years ago. Has a known 70% stenosis in the right coronary artery left for medical therapy. Will repeat a Lexiscan Myoview.   Medication Adjustments/Labs and Tests Ordered: Current medicines are reviewed at length with the patient today.  Concerns regarding medicines are outlined above.  Medication changes, Labs and Tests ordered today are listed in the Patient Instructions below. Patient Instructions  Medication Instructions: Dr Sallyanne Kuster has recommended making the following medication changes: 1. INCREASE Furosemide (Lasix) to 80 mg daily 2. START Potassium 20 mEq - take 1 tablet by mouth TWICE daily  Labwork: Your physician recommends that you return for lab work when you return for your stress test.  Testing/Procedures: 1. [place] physician has requested that you have a lexiscan myoview. For further information please visit [email or web address]. Please follow instruction sheet, as given.  Follow-up: Dr Sallyanne Kuster recommends that you schedule a follow-up appointment first available.  If you need a refill on your cardiac medications before your next appointment, please call your pharmacy.    Signed, Sanda Klein, MD  05/26/2017 1:02 PM    [facility] [place], [place], [place]  [address] Phone: [phone]; Fax: [phone]

## 2017-05-28 ENCOUNTER — Encounter: Payer: Self-pay | Admitting: Cardiovascular Disease

## 2017-05-28 ENCOUNTER — Ambulatory Visit (INDEPENDENT_AMBULATORY_CARE_PROVIDER_SITE_OTHER): Payer: Self-pay | Admitting: Cardiovascular Disease

## 2017-05-28 VITALS — BP 118/68 | HR 86 | Ht 59.0 in | Wt 139.0 lb

## 2017-05-28 DIAGNOSIS — T82110D Breakdown (mechanical) of cardiac electrode, subsequent encounter: Secondary | ICD-10-CM

## 2017-05-28 NOTE — Progress Notes (Signed)
Asked her to return to the office today for repeat pacemaker check and reprogramming.  There was evidence of ventricular undersensing and lack of capture on her 12-lead ECG and previous device check.  Interrogation of the device today shows extremely high ventricular pacing thresholds in the bipolar configuration as well as an elevated ventricular lead impedance.  In the unipolar configuration the impedance is acceptable at 310 ohms and the threshold is fair at 1.25 V at 0.8 ms.  The ventricular lead was then left with bipolar sensing and unipolar pacing.  Atrial lead parameters remain good.  With the current settings estimated generator longevity is 3.3 years.  Discussed the fact that the most common cause for this is likely to be ventricular lead exit block and that eventually she may require ventricular lead revision since she has second-degree AV block.  It is possible that loss of ventricular capture may have been responsible for her fatigue and dyspnea.  We will see how she does over the last several weeks but also get her [place] since previous presentation with coronary disease seemed to be with shortness of breath.

## 2017-05-28 NOTE — Patient Instructions (Signed)
Dr Sallyanne Kuster recommends that you schedule a follow-up appointment in 1 month with a pacemaker check.  If you need a refill on your cardiac medications before your next appointment, please call your pharmacy.

## 2017-05-29 ENCOUNTER — Telehealth (HOSPITAL_COMMUNITY): Payer: Self-pay

## 2017-05-29 ENCOUNTER — Encounter: Payer: Self-pay | Admitting: *Deleted

## 2017-05-29 NOTE — Telephone Encounter (Signed)
Encounter complete. 

## 2017-05-30 ENCOUNTER — Ambulatory Visit (HOSPITAL_COMMUNITY)
Admission: RE | Admit: 2017-05-30 | Discharge: 2017-05-30 | Disposition: A | Discharge status: Home / Self Care | Payer: Medicare Other | Source: Ambulatory Visit | Attending: Cardiovascular Disease | Admitting: Cardiovascular Disease

## 2017-05-30 DIAGNOSIS — Z8249 Family history of ischemic heart disease and other diseases of the circulatory system: Secondary | ICD-10-CM | POA: Diagnosis not present

## 2017-05-30 DIAGNOSIS — I509 Heart failure, unspecified: Secondary | ICD-10-CM | POA: Diagnosis not present

## 2017-05-30 DIAGNOSIS — R5383 Other fatigue: Secondary | ICD-10-CM | POA: Insufficient documentation

## 2017-05-30 DIAGNOSIS — R0602 Shortness of breath: Secondary | ICD-10-CM | POA: Diagnosis not present

## 2017-05-30 DIAGNOSIS — I4891 Unspecified atrial fibrillation: Secondary | ICD-10-CM | POA: Diagnosis not present

## 2017-05-30 DIAGNOSIS — E0590 Thyrotoxicosis, unspecified without thyrotoxic crisis or storm: Secondary | ICD-10-CM | POA: Diagnosis not present

## 2017-05-30 DIAGNOSIS — R9439 Abnormal result of other cardiovascular function study: Secondary | ICD-10-CM | POA: Diagnosis not present

## 2017-05-30 DIAGNOSIS — I110 Hypertensive heart disease with heart failure: Secondary | ICD-10-CM | POA: Diagnosis not present

## 2017-05-30 DIAGNOSIS — I2510 Atherosclerotic heart disease of native coronary artery without angina pectoris: Secondary | ICD-10-CM | POA: Diagnosis not present

## 2017-05-30 DIAGNOSIS — E119 Type 2 diabetes mellitus without complications: Secondary | ICD-10-CM | POA: Diagnosis not present

## 2017-05-30 DIAGNOSIS — Z79899 Other long term (current) drug therapy: Secondary | ICD-10-CM | POA: Diagnosis not present

## 2017-05-30 DIAGNOSIS — I4892 Unspecified atrial flutter: Secondary | ICD-10-CM | POA: Diagnosis not present

## 2017-05-30 LAB — BASIC METABOLIC PANEL WITH GFR
BUN/Creatinine Ratio: 13 (ref 12–28)
BUN: 10 mg/dL (ref 8–27)
CO2: 21 mmol/L (ref 20–29)
Calcium: 9.5 mg/dL (ref 8.7–10.3)
Chloride: 102 mmol/L (ref 96–106)
Creatinine, Ser: 0.76 mg/dL (ref 0.57–1.00)
GFR calc Af Amer: 91 mL/min/1.73
GFR calc non Af Amer: 79 mL/min/1.73
Glucose: 106 mg/dL — ABNORMAL HIGH (ref 65–99)
Potassium: 4 mmol/L (ref 3.5–5.2)
Sodium: 141 mmol/L (ref 134–144)

## 2017-05-30 LAB — MYOCARDIAL PERFUSION IMAGING
CHL CUP RESTING HR STRESS: 63 {beats}/min
CSEPPHR: 69 {beats}/min
LV sys vol: 20 mL
LVDIAVOL: 64 mL (ref 46–106)
SDS: 4
SRS: 0
SSS: 4
TID: 1.34

## 2017-05-30 MED ORDER — AMINOPHYLLINE 25 MG/ML IV SOLN
75.0000 mg | Freq: Once | INTRAVENOUS | Status: AC
  Administered 2017-05-30: 75 mg via INTRAVENOUS

## 2017-05-30 MED ORDER — REGADENOSON 0.4 MG/5ML IV SOLN
0.4000 mg | Freq: Once | INTRAVENOUS | Status: AC
  Administered 2017-05-30: 0.4 mg via INTRAVENOUS

## 2017-05-30 MED ORDER — TECHNETIUM TC 99M TETROFOSMIN IV KIT
10.1000 | PACK | Freq: Once | INTRAVENOUS | Status: AC | PRN
  Administered 2017-05-30: 10.1 via INTRAVENOUS
  Filled 2017-05-30: qty 11

## 2017-05-30 MED ORDER — TECHNETIUM TC 99M TETROFOSMIN IV KIT
29.6000 | PACK | Freq: Once | INTRAVENOUS | Status: AC | PRN
  Administered 2017-05-30: 29.6 via INTRAVENOUS
  Filled 2017-05-30: qty 30

## 2017-06-16 ENCOUNTER — Ambulatory Visit: Payer: Medicare Other | Admitting: Cardiovascular Disease

## 2017-06-23 DIAGNOSIS — E119 Type 2 diabetes mellitus without complications: Secondary | ICD-10-CM | POA: Diagnosis not present

## 2017-06-23 DIAGNOSIS — I10 Essential (primary) hypertension: Secondary | ICD-10-CM | POA: Diagnosis not present

## 2017-06-23 DIAGNOSIS — E0590 Thyrotoxicosis, unspecified without thyrotoxic crisis or storm: Secondary | ICD-10-CM | POA: Diagnosis not present

## 2017-06-26 DIAGNOSIS — E119 Type 2 diabetes mellitus without complications: Secondary | ICD-10-CM | POA: Diagnosis not present

## 2017-06-26 DIAGNOSIS — I10 Essential (primary) hypertension: Secondary | ICD-10-CM | POA: Diagnosis not present

## 2017-06-26 DIAGNOSIS — E0590 Thyrotoxicosis, unspecified without thyrotoxic crisis or storm: Secondary | ICD-10-CM | POA: Diagnosis not present

## 2017-06-27 ENCOUNTER — Ambulatory Visit: Payer: Medicare Other | Admitting: Cardiovascular Disease

## 2017-07-07 ENCOUNTER — Ambulatory Visit (INDEPENDENT_AMBULATORY_CARE_PROVIDER_SITE_OTHER): Payer: Medicare Other | Admitting: *Deleted

## 2017-07-07 DIAGNOSIS — R001 Bradycardia, unspecified: Secondary | ICD-10-CM | POA: Diagnosis not present

## 2017-07-07 NOTE — Progress Notes (Signed)
Remote pacemaker transmission.   

## 2017-07-08 ENCOUNTER — Encounter: Payer: Self-pay | Admitting: Cardiology

## 2017-07-09 ENCOUNTER — Other Ambulatory Visit: Payer: Self-pay | Admitting: Cardiovascular Disease

## 2017-07-10 NOTE — Telephone Encounter (Signed)
Rx has been sent to the pharmacy electronically. ° °

## 2017-07-14 LAB — CUP PACEART INCLINIC DEVICE CHECK
Date Time Interrogation Session: 20190211165125
Implantable Lead Location: 753859
MDC IDC LEAD IMPLANT DT: 20120702
MDC IDC LEAD IMPLANT DT: 20120702
MDC IDC LEAD LOCATION: 753860
MDC IDC PG IMPLANT DT: 20120702
MDC IDC PG SERIAL: 7255453

## 2017-07-17 ENCOUNTER — Other Ambulatory Visit: Payer: Self-pay | Admitting: Cardiovascular Disease

## 2017-07-18 NOTE — Telephone Encounter (Signed)
Rx has been sent to the pharmacy electronically. ° °

## 2017-07-22 LAB — CUP PACEART INCLINIC DEVICE CHECK
Implantable Lead Implant Date: 20120702
Implantable Lead Location: 753860
Implantable Pulse Generator Implant Date: 20120702
MDC IDC LEAD IMPLANT DT: 20120702
MDC IDC LEAD LOCATION: 753859
MDC IDC SESS DTM: 20190219162033
Pulse Gen Model: 2210
Pulse Gen Serial Number: 7255453

## 2017-07-24 ENCOUNTER — Encounter: Payer: Self-pay | Admitting: Cardiovascular Disease

## 2017-07-24 ENCOUNTER — Ambulatory Visit (INDEPENDENT_AMBULATORY_CARE_PROVIDER_SITE_OTHER): Payer: Medicare Other | Admitting: Cardiovascular Disease

## 2017-07-24 VITALS — BP 116/58 | HR 65 | Ht 59.0 in | Wt 137.2 lb

## 2017-07-24 DIAGNOSIS — R9439 Abnormal result of other cardiovascular function study: Secondary | ICD-10-CM

## 2017-07-24 DIAGNOSIS — I480 Paroxysmal atrial fibrillation: Secondary | ICD-10-CM | POA: Diagnosis not present

## 2017-07-24 DIAGNOSIS — Z7901 Long term (current) use of anticoagulants: Secondary | ICD-10-CM | POA: Diagnosis not present

## 2017-07-24 DIAGNOSIS — Z950 Presence of cardiac pacemaker: Secondary | ICD-10-CM

## 2017-07-24 DIAGNOSIS — I441 Atrioventricular block, second degree: Secondary | ICD-10-CM

## 2017-07-24 DIAGNOSIS — I25119 Atherosclerotic heart disease of native coronary artery with unspecified angina pectoris: Secondary | ICD-10-CM

## 2017-07-24 DIAGNOSIS — I5033 Acute on chronic diastolic (congestive) heart failure: Secondary | ICD-10-CM

## 2017-07-24 DIAGNOSIS — Z01812 Encounter for preprocedural laboratory examination: Secondary | ICD-10-CM | POA: Diagnosis not present

## 2017-07-24 MED ORDER — FUROSEMIDE 40 MG PO TABS: 40.0000 mg | ORAL_TABLET | Freq: Every day | ORAL | 3 refills | Status: DC

## 2017-07-24 MED ORDER — METOPROLOL TARTRATE 50 MG PO TABS: 50.0000 mg | ORAL_TABLET | Freq: Two times a day (BID) | ORAL | 3 refills | Status: DC

## 2017-07-24 MED ORDER — POTASSIUM CHLORIDE CRYS ER 20 MEQ PO TBCR: 20.0000 meq | EXTENDED_RELEASE_TABLET | Freq: Every day | ORAL | 3 refills | Status: DC

## 2017-07-24 NOTE — Patient Instructions (Signed)
Dr Sallyanne Kuster has recommended making the following medication changes: 1. DECREASE Furosemide to 40 mg daily 2. DECREASE Potassium to 20 mEq daily    [place] [address] [place] [address] Dept: [phone] Loc: [place]  07/24/2017  You are scheduled for a Cardiac Catheterization on Wednesday, February 27 with Dr. Shelva Majestic.  1. Please arrive at the [facility] (Main Entrance A) at [facility]: [address] [place], [place] [address] at 11:00 AM (two hours before your procedure to ensure your preparation). Free valet parking service is available.   Special note: Every effort is made to have your procedure done on time. Please understand that emergencies sometimes delay scheduled procedures.  2. Diet: Do not eat or drink anything after midnight prior to your procedure except sips of water to take medications.  3. Labs: You will need to have blood drawn no more than 7 days prior to your procedure. You do not need to be fasting.  4. Medication instructions in preparation for your procedure:  Stop taking Pradaxa (Dabigatran) on Monday, February 25.  Stop taking, Cozaar (Losartan) and Furosemide (Lasix) on Wednesday, February 27.    On the morning of your procedure, take your Aspirin and any morning medicines NOT listed above.  You may use sips of water.  5. Plan for one night stay--bring personal belongings. 6. Bring a current list of your medications and current insurance cards. 7. You MUST have a responsible person to drive you home. 8. Someone MUST be with you the first 24 hours after you arrive home or your discharge will be delayed. 9. Please wear clothes that are easy to get on and off and wear slip-on shoes.  Thank you for allowing [place] to care for you!   -- [facility]

## 2017-07-24 NOTE — Progress Notes (Signed)
Patient ID: Hailey [name], female   DOB: [birth year], 72 y.o.   MRN: [id number]    Cardiology Office Note    Date:  07/27/2017   ID:  Hailey [name] 15-Jan-1946, MRN [id number]  PCP:  Jani Gravel, MD  Cardiologist:  Thompson Grayer, MD;  Sanda Klein, MD   Chief Complaint  Patient presents with  . Follow-up    Pt did express two weeks ago have some chest tightness and discomfort but it has since resolved     History of Present Illness:  Hailey [name] is a 72 y.o. female with paroxysmal atrial fibrillation (s/p RF ablation procedures),s/p dual-chamber permanent pacemaker ([employer]. [employer] Accent 2012 for tachycardia-bradycardia syndrome and 2:1 atrioventricular block), CAD (70% stenosis in the mid right coronary artery without ischemia on nuclear perfusion imaging in 2012 and March 2016), chronic diastolic heart failure (history of acute decompensation on Multaq), type 2 diabetes mellitus, hypertension, good lipid profile without therapy, hyperthyroidism (currently euthyroid on propylthiouracil).  She feels better than at her last appointment, after we changed ventricular lead outputs to unipolar configuration.  However, she still has exertional dyspnea and exertional chest heaviness, for example when climbing a flight of stairs.  Her nuclear stress test showed evidence of anterior wall ischemia, although a relatively small distribution and sparing the apex. When she presented with an acute coronary event years ago her primary symptom was dyspnea. She has never really had angina.  Symptoms did not correlate with her episodes of atrial fibrillation.  In fact since her pacemaker was changed out he is only had a couple of meaningful episodes of atrial fibrillation: 1 hour and 22 minutes on January 6 and 1 hour and 12 minutes on February 4.  All other episodes of atrial fibrillation have been around a minute in duration or less.   Not had any bleeding problems on chronic anticoagulation with Pradaxa and  denies any focal neurological events. She has a steady gait and has not had falls.  Electrocardiogram today shows atrial paced, ventricular sensed rhythm without any repolarization abnormalities.  Slightly prolonged AV delay 212 ms, normal QTC 438 ms  Her pacemaker is a Buyer, retail implanted in 2012. It has roughly 2.8 years of estimated longevity.  The ventricular lead is programmed in unipolar mode due to loss of capture and bipolar configuration.  The threshold is 1.25 V at 0.8 ms.  Sensed R waves 4 mV, impedance 250 ohms.  She has 88% atrial pacing and 33% ventricular pacing.  Atrial lead parameters remain normal  Past Medical History:  Diagnosis Date  . Acute on chronic diastolic CHF (congestive heart failure), NYHA class 1 ([place]) 04/29/2013  . Allergic rhinitis   . Brady-tachy syndrome (HCC)    s/p STJ dual chamber PPM by Dr Sallyanne Kuster  . CAD (coronary artery disease)    70% RCA stenosis, treated medically  . Diabetes mellitus ([place])   . DJD (degenerative joint disease)   . Dysrhythmia    Paroxysmal Atrial Fib.  . Hypertension   . Hyperthyroidism   . Paroxysmal atrial fibrillation (HCC)   . Polio    as a child  . PONV (postoperative nausea and vomiting)   . Presence of permanent cardiac pacemaker     Past Surgical History:  Procedure Laterality Date  . arm surgery d/t fx Right 96  . ATRIAL FIBRILLATION ABLATION  07/31/2012; 08-10-13   PVI by Dr Rayann Heman  . ATRIAL FIBRILLATION ABLATION N/A 07/31/2012   Procedure: ATRIAL FIBRILLATION ABLATION;  Surgeon: Thompson Grayer, MD;  Location: [facility] CATH LAB;  Service: Cardiovascular;  Laterality: N/A;  . ATRIAL FIBRILLATION ABLATION N/A 08/10/2013   Procedure: ATRIAL FIBRILLATION ABLATION;  Surgeon: Coralyn Mark, MD;  Location: [facility] CATH LAB;  Service: Cardiovascular;  Laterality: N/A;  . CARDIAC CATHETERIZATION  12/03/2010   single vessel mid RCA  . CHOLECYSTECTOMY  1994  . COLONOSCOPY WITH PROPOFOL N/A 03/21/2015   Procedure: COLONOSCOPY WITH  PROPOFOL;  Surgeon: Juanita Craver, MD;  Location: [facility] ENDOSCOPY;  Service: Endoscopy;  Laterality: N/A;  . LUMBAR LAMINECTOMY/DECOMPRESSION MICRODISCECTOMY Right 12/23/2012   Procedure: Right L5-S1 Laminotomy for resection of synovial cyst;  Surgeon: Hosie Spangle, MD;  Location: [facility] NEURO ORS;  Service: Neurosurgery;  Laterality: Right;  Right Lumbar five-sacral one Laminotomy for resection of synovial cyst  . NM MYOCAR PERF WALL MOTION  12/20/2010   normal  . PACEMAKER INSERTION  12/03/10   SJM Accent DR RF implanted by Dr Sallyanne Kuster  . TEE WITHOUT CARDIOVERSION N/A 07/30/2012   Procedure: TRANSESOPHAGEAL ECHOCARDIOGRAM (TEE);  Surgeon: Sanda Klein, MD;  Location[facility] ENDOSCOPY;  Service: Cardiovascular;  Laterality: N/A;  h&p in file-hope  . TEE WITHOUT CARDIOVERSION N/A 08/09/2013   Procedure: TRANSESOPHAGEAL ECHOCARDIOGRAM (TEE);  Surgeon: Lelon Perla, MD;  Location: [facility] ENDOSCOPY;  Service: Cardiovascular;  Laterality: N/A;  . TOTAL ABDOMINAL HYSTERECTOMY  1981    Current Medications: Outpatient Medications Prior to Visit  Medication Sig Dispense Refill  . Cholecalciferol (VITAMIN D3) 1000 UNITS CAPS Take 1 capsule by mouth daily.     . Cyanocobalamin (VITAMIN B12 PO) Take 1 tablet by mouth daily as needed (for energy).     . dabigatran (PRADAXA) 150 MG CAPS capsule Take 1 capsule (150 mg total) 2 (two) times daily by mouth. 60 capsule 0  . DILT-XR 180 MG 24 hr capsule TAKE 1 CAPSULE BY MOUTH ONCE DAILY (Patient taking differently: TAKE 180 MG BY MOUTH ONCE DAILY) 90 capsule 1  . loperamide (IMODIUM A-D) 2 MG tablet Take 2 mg by mouth as needed for diarrhea or loose stools.     Marland Kitchen losartan (COZAAR) 25 MG tablet Take 25 mg by mouth daily.   0  . OVER THE COUNTER MEDICATION Take 1 tablet by mouth daily. [employer] Brand allergy medication    . oxybutynin (DITROPAN) 5 MG tablet Take 5 mg by mouth daily.   4  . pantoprazole (PROTONIX) 40 MG tablet Take 40 mg by mouth daily.    Marland Kitchen propylthiouracil  (PTU) 50 MG tablet Take 50-100 mg by mouth See admin instructions. Take 100 mg by mouth in the morning and take 50 mg by mouth at bedtime    . saxagliptin HCl (ONGLYZA) 5 MG TABS tablet Take 5 mg by mouth daily.    Marland Kitchen CALCIUM CARBONATE PO Take 600 mg by mouth daily.    . furosemide (LASIX) 80 MG tablet Take 1 tablet (80 mg total) by mouth daily. 90 tablet 3  . metoprolol tartrate (LOPRESSOR) 50 MG tablet TAKE 1 TABLET BY MOUTH TWICE DAILY 180 tablet 2  . potassium chloride SA (KLOR-CON M20) 20 MEQ tablet Take 1 tablet (20 mEq total) by mouth 2 (two) times daily. 180 tablet 3  . cyclobenzaprine (FLEXERIL) 10 MG tablet Take half tab as needed prn     No facility-administered medications prior to visit.      Allergies:   Codeine; Ketorolac; Meperidine; Demerol; Dronedarone; and Cyclobenzaprine   Social History   Socioeconomic History  . Marital status: Married  Spouse name: [name]  . Number of children: None  . Years of education: None  . Highest education level: None  Social Needs  . Financial resource strain: None  . Food insecurity - worry: None  . Food insecurity - inability: None  . Transportation needs - medical: None  . Transportation needs - non-medical: None  Occupational History  . Occupation: housewife  Tobacco Use  . Smoking status: Never Smoker  . Smokeless tobacco: Never Used  Substance and Sexual Activity  . Alcohol use: No  . Drug use: No  . Sexual activity: Not Currently    Birth control/protection: Abstinence  Other Topics Concern  . None  Social History Narrative   Lives in [place].  Retired.     Family History:  The patient's family history includes Breast cancer in her sister and sister; Heart disease in her sister.   ROS:   Please see the history of present illness.    ROS All other systems reviewed and are negative.   PHYSICAL EXAM:   VS:  BP (!) 116/58 (BP Location: Left Arm, Patient Position: Sitting, Cuff Size: Normal)   Pulse 65   Ht 4'  11" (1.499 m)   Wt 137 lb 3.2 oz (62.2 kg)   BMI 27.71 kg/m     General: Alert, oriented x3, no distress, mildly overweight Head: no evidence of trauma, PERRL, EOMI, no exophtalmos or lid lag, no myxedema, no xanthelasma; normal ears, nose and oropharynx Neck: normal jugular venous pulsations and no hepatojugular reflux; brisk carotid pulses without delay and no carotid bruits Chest: clear to auscultation, no signs of consolidation by percussion or palpation, normal fremitus, symmetrical and full respiratory excursions Cardiovascular: normal position and quality of the apical impulse, regular rhythm, normal first and second heart sounds, no murmurs, rubs or gallops. Healthy PM site, left subclavian. Abdomen: no tenderness or distention, no masses by palpation, no abnormal pulsatility or arterial bruits, normal bowel sounds, no hepatosplenomegaly Extremities: no clubbing, cyanosis or edema; 2+ radial, ulnar and brachial pulses bilaterally; 2+ right femoral, posterior tibial and dorsalis pedis pulses; 2+ left femoral, posterior tibial and dorsalis pedis pulses; no subclavian or femoral bruits Neurological: grossly nonfocal Psych: Normal mood and affect   Wt Readings from Last 3 Encounters:  07/24/17 137 lb 3.2 oz (62.2 kg)  05/30/17 139 lb (63 kg)  05/28/17 139 lb (63 kg)      Studies/Labs Reviewed:   EKG:  EKG is ordered today.  The ekg ordered today shows atrial paced, ventricular sensed rhythm without ischemic repolarization changes.  AV delay 212 ms, QTC 438 ms Lipid Panel    Component Value Date/Time   CHOL 94 11/24/2010 0837   TRIG 51 11/24/2010 0837   HDL 53 11/24/2010 0837   CHOLHDL 1.8 11/24/2010 0837   VLDL 10 11/24/2010 0837   LDLCALC 31 11/24/2010 0837    ASSESSMENT:    1. Coronary artery disease involving native coronary artery of native heart with angina pectoris ([place])   2. Abnormal cardiovascular stress test   3. Acute on chronic diastolic CHF (congestive heart  failure), NYHA class 1 (HCC)   4. Paroxysmal atrial fibrillation (HCC)   5. Pre-procedure lab exam   6. Long term current use of anticoagulant   7. Second degree atrioventricular block   8. Pacemaker      PLAN:  In order of problems listed above:   1. CAD: Known to have an 70% stenosis in the right coronary artery by cath 2016 (  without perfusion abnormalities on previous study 2016), but now has new ischemic abnormalities on the nuclear stress test, in the anterior wall territory.   2. Abn nuclear study: The perfusion defect distribution does not fit with a typical LAD stenosis and may be related to a diagonal artery problem.  She remains symptomatic and I think we should proceed to coronary angiography.  This procedure has been fully reviewed with the patient and written informed consent has been obtained. 3. CHF: Still has exertional dyspnea, despite no clinical hypervolemia by exam. Most recent echo performed in June 2017 and the recent nuclear study showed normal left ventricular ejection fraction and wall motion. 4. AFib: Recent burden of arrhythmia is low and this cannot explain her recent worsening symptoms.  Does not meet criteria for antiarrhythmic therapy.  CHADSVasc at least 3 (age, CAD, history of CHF). 5. Anticoagulation: Well-tolerated.  Will temporarily stop her anticoagulation for the planned cardiac catheterization 6. SSS: She has close to 100% atrial pacing and atrial heart rate histogram distribution appears to be favorable. 7. 2nd deg AVB: Some of her symptoms are related to loss of ventricular pacing and we reprogrammed her ventricular lead pacing in unipolar mode 8. PPM: Despite increased output on ventricular lead, still has roughly 2.8 years of estimated generator longevity.  We will plan to revise her ventricular lead at the time of generator change out.   Medication Adjustments/Labs and Tests Ordered: Current medicines are reviewed at length with the patient today.   Concerns regarding medicines are outlined above.  Medication changes, Labs and Tests ordered today are listed in the Patient Instructions below. Patient Instructions  Dr Sallyanne Kuster has recommended making the following medication changes: 1. DECREASE Furosemide to 40 mg daily 2. DECREASE Potassium to 20 mEq daily    [facility] [address] [place] [address] Dept: [phone] Loc: [place]  07/24/2017  You are scheduled for a Cardiac Catheterization on Wednesday, February 27 with Dr. Shelva Majestic.  1. Please arrive at the [facility] (Main Entrance A) at [facility]: [address] [place], [place] [address] at 11:00 AM (two hours before your procedure to ensure your preparation). Free valet parking service is available.   Special note: Every effort is made to have your procedure done on time. Please understand that emergencies sometimes delay scheduled procedures.  2. Diet: Do not eat or drink anything after midnight prior to your procedure except sips of water to take medications.  3. Labs: You will need to have blood drawn no more than 7 days prior to your procedure. You do not need to be fasting.  4. Medication instructions in preparation for your procedure:  Stop taking Pradaxa (Dabigatran) on Monday, February 25.  Stop taking, Cozaar (Losartan) and Furosemide (Lasix) on Wednesday, February 27.    On the morning of your procedure, take your Aspirin and any morning medicines NOT listed above.  You may use sips of water.  5. Plan for one night stay--bring personal belongings. 6. Bring a current list of your medications and current insurance cards. 7. You MUST have a responsible person to drive you home. 8. Someone MUST be with you the first 24 hours after you arrive home or your discharge will be delayed. 9. Please wear clothes that are easy to get on and off  and wear slip-on shoes.  Thank you for allowing [place] to care for you!   --  [facility]    Signed, Sanda Klein, MD  07/27/2017 3:46 PM    [place], [place],   [address] Phone: [phone]; Fax: [phone]

## 2017-07-24 NOTE — H&P (View-Only) (Signed)
Patient ID: Hailey [name], female   DOB: May 11, 1946, 72 y.o.   MRN: [id number]    Cardiology Office Note    Date:  07/27/2017   ID:  Hailey, [name] 29-Nov-1945, MRN [id number]  PCP:  Jani Gravel, MD  Cardiologist:  Thompson Grayer, MD;  Sanda Klein, MD   Chief Complaint  Patient presents with  . Follow-up    Pt did express two weeks ago have some chest tightness and discomfort but it has since resolved     History of Present Illness:  Hailey [name] is a 72 y.o. female with paroxysmal atrial fibrillation (s/p RF ablation procedures),s/p dual-chamber permanent pacemaker ([employer]. [employer] Accent 2012 for tachycardia-bradycardia syndrome and 2:1 atrioventricular block), CAD (70% stenosis in the mid right coronary artery without ischemia on nuclear perfusion imaging in 2012 and March 2016), chronic diastolic heart failure (history of acute decompensation on Multaq), type 2 diabetes mellitus, hypertension, good lipid profile without therapy, hyperthyroidism (currently euthyroid on propylthiouracil).  She feels better than at her last appointment, after we changed ventricular lead outputs to unipolar configuration.  However, she still has exertional dyspnea and exertional chest heaviness, for example when climbing a flight of stairs.  Her nuclear stress test showed evidence of anterior wall ischemia, although a relatively small distribution and sparing the apex. When she presented with an acute coronary event years ago her primary symptom was dyspnea. She has never really had angina.  Symptoms did not correlate with her episodes of atrial fibrillation.  In fact since her pacemaker was changed out he is only had a couple of meaningful episodes of atrial fibrillation: 1 hour and 22 minutes on January 6 and 1 hour and 12 minutes on February 4.  All other episodes of atrial fibrillation have been around a minute in duration or less.   Not had any bleeding problems on chronic anticoagulation with Pradaxa and  denies any focal neurological events. She has a steady gait and has not had falls.  Electrocardiogram today shows atrial paced, ventricular sensed rhythm without any repolarization abnormalities.  Slightly prolonged AV delay 212 ms, normal QTC 438 ms  Her pacemaker is a Buyer, retail implanted in 2012. It has roughly 2.8 years of estimated longevity.  The ventricular lead is programmed in unipolar mode due to loss of capture and bipolar configuration.  The threshold is 1.25 V at 0.8 ms.  Sensed R waves 4 mV, impedance 250 ohms.  She has 88% atrial pacing and 33% ventricular pacing.  Atrial lead parameters remain normal  Past Medical History:  Diagnosis Date  . Acute on chronic diastolic CHF (congestive heart failure), NYHA class 1 ([place]) 04/29/2013  . Allergic rhinitis   . Brady-tachy syndrome (HCC)    s/p STJ dual chamber PPM by Dr Sallyanne Kuster  . CAD (coronary artery disease)    70% RCA stenosis, treated medically  . Diabetes mellitus ([place])   . DJD (degenerative joint disease)   . Dysrhythmia    Paroxysmal Atrial Fib.  . Hypertension   . Hyperthyroidism   . Paroxysmal atrial fibrillation (HCC)   . Polio    as a child  . PONV (postoperative nausea and vomiting)   . Presence of permanent cardiac pacemaker     Past Surgical History:  Procedure Laterality Date  . arm surgery d/t fx Right 96  . ATRIAL FIBRILLATION ABLATION  07/31/2012; 08-10-13   PVI by Dr Rayann Heman  . ATRIAL FIBRILLATION ABLATION N/A 07/31/2012   Procedure: ATRIAL FIBRILLATION ABLATION;  Surgeon: Thompson Grayer, MD;  Location: [facility] CATH LAB;  Service: Cardiovascular;  Laterality: N/A;  . ATRIAL FIBRILLATION ABLATION N/A 08/10/2013   Procedure: ATRIAL FIBRILLATION ABLATION;  Surgeon: Coralyn Mark, MD;  Location: [facility] CATH LAB;  Service: Cardiovascular;  Laterality: N/A;  . CARDIAC CATHETERIZATION  12/03/2010   single vessel mid RCA  . CHOLECYSTECTOMY  1994  . COLONOSCOPY WITH PROPOFOL N/A 03/21/2015   Procedure: COLONOSCOPY WITH  PROPOFOL;  Surgeon: Juanita Craver, MD;  Location: [facility] ENDOSCOPY;  Service: Endoscopy;  Laterality: N/A;  . LUMBAR LAMINECTOMY/DECOMPRESSION MICRODISCECTOMY Right 12/23/2012   Procedure: Right L5-S1 Laminotomy for resection of synovial cyst;  Surgeon: Hosie Spangle, MD;  Location: [facility] NEURO ORS;  Service: Neurosurgery;  Laterality: Right;  Right Lumbar five-sacral one Laminotomy for resection of synovial cyst  . NM MYOCAR PERF WALL MOTION  12/20/2010   normal  . PACEMAKER INSERTION  12/03/10   SJM Accent DR RF implanted by Dr Sallyanne Kuster  . TEE WITHOUT CARDIOVERSION N/A 07/30/2012   Procedure: TRANSESOPHAGEAL ECHOCARDIOGRAM (TEE);  Surgeon: Sanda Klein, MD;  Location: [facility] ENDOSCOPY;  Service: Cardiovascular;  Laterality: N/A;  h&p in file-hope  . TEE WITHOUT CARDIOVERSION N/A 08/09/2013   Procedure: TRANSESOPHAGEAL ECHOCARDIOGRAM (TEE);  Surgeon: Lelon Perla, MD;  Location: [facility] ENDOSCOPY;  Service: Cardiovascular;  Laterality: N/A;  . TOTAL ABDOMINAL HYSTERECTOMY  1981    Current Medications: Outpatient Medications Prior to Visit  Medication Sig Dispense Refill  . Cholecalciferol (VITAMIN D3) 1000 UNITS CAPS Take 1 capsule by mouth daily.     . Cyanocobalamin (VITAMIN B12 PO) Take 1 tablet by mouth daily as needed (for energy).     . dabigatran (PRADAXA) 150 MG CAPS capsule Take 1 capsule (150 mg total) 2 (two) times daily by mouth. 60 capsule 0  . DILT-XR 180 MG 24 hr capsule TAKE 1 CAPSULE BY MOUTH ONCE DAILY (Patient taking differently: TAKE 180 MG BY MOUTH ONCE DAILY) 90 capsule 1  . loperamide (IMODIUM A-D) 2 MG tablet Take 2 mg by mouth as needed for diarrhea or loose stools.     Marland Kitchen losartan (COZAAR) 25 MG tablet Take 25 mg by mouth daily.   0  . OVER THE COUNTER MEDICATION Take 1 tablet by mouth daily. [employer] Brand allergy medication    . oxybutynin (DITROPAN) 5 MG tablet Take 5 mg by mouth daily.   4  . pantoprazole (PROTONIX) 40 MG tablet Take 40 mg by mouth daily.    Marland Kitchen propylthiouracil  (PTU) 50 MG tablet Take 50-100 mg by mouth See admin instructions. Take 100 mg by mouth in the morning and take 50 mg by mouth at bedtime    . saxagliptin HCl (ONGLYZA) 5 MG TABS tablet Take 5 mg by mouth daily.    Marland Kitchen CALCIUM CARBONATE PO Take 600 mg by mouth daily.    . furosemide (LASIX) 80 MG tablet Take 1 tablet (80 mg total) by mouth daily. 90 tablet 3  . metoprolol tartrate (LOPRESSOR) 50 MG tablet TAKE 1 TABLET BY MOUTH TWICE DAILY 180 tablet 2  . potassium chloride SA (KLOR-CON M20) 20 MEQ tablet Take 1 tablet (20 mEq total) by mouth 2 (two) times daily. 180 tablet 3  . cyclobenzaprine (FLEXERIL) 10 MG tablet Take half tab as needed prn     No facility-administered medications prior to visit.      Allergies:   Codeine; Ketorolac; Meperidine; Demerol; Dronedarone; and Cyclobenzaprine   Social History   Socioeconomic History  . Marital status: Married  Spouse name: [name]  . Number of children: None  . Years of education: None  . Highest education level: None  Social Needs  . Financial resource strain: None  . Food insecurity - worry: None  . Food insecurity - inability: None  . Transportation needs - medical: None  . Transportation needs - non-medical: None  Occupational History  . Occupation: housewife  Tobacco Use  . Smoking status: Never Smoker  . Smokeless tobacco: Never Used  Substance and Sexual Activity  . Alcohol use: No  . Drug use: No  . Sexual activity: Not Currently    Birth control/protection: Abstinence  Other Topics Concern  . None  Social History Narrative   Lives in [place].  Retired.     Family History:  The patient's family history includes Breast cancer in her sister and sister; Heart disease in her sister.   ROS:   Please see the history of present illness.    ROS All other systems reviewed and are negative.   PHYSICAL EXAM:   VS:  BP (!) 116/58 (BP Location: Left Arm, Patient Position: Sitting, Cuff Size: Normal)   Pulse 65   Ht 4'  11" (1.499 m)   Wt 137 lb 3.2 oz (62.2 kg)   BMI 27.71 kg/m     General: Alert, oriented x3, no distress, mildly overweight Head: no evidence of trauma, PERRL, EOMI, no exophtalmos or lid lag, no myxedema, no xanthelasma; normal ears, nose and oropharynx Neck: normal jugular venous pulsations and no hepatojugular reflux; brisk carotid pulses without delay and no carotid bruits Chest: clear to auscultation, no signs of consolidation by percussion or palpation, normal fremitus, symmetrical and full respiratory excursions Cardiovascular: normal position and quality of the apical impulse, regular rhythm, normal first and second heart sounds, no murmurs, rubs or gallops. Healthy PM site, left subclavian. Abdomen: no tenderness or distention, no masses by palpation, no abnormal pulsatility or arterial bruits, normal bowel sounds, no hepatosplenomegaly Extremities: no clubbing, cyanosis or edema; 2+ radial, ulnar and brachial pulses bilaterally; 2+ right femoral, posterior tibial and dorsalis pedis pulses; 2+ left femoral, posterior tibial and dorsalis pedis pulses; no subclavian or femoral bruits Neurological: grossly nonfocal Psych: Normal mood and affect   Wt Readings from Last 3 Encounters:  07/24/17 137 lb 3.2 oz (62.2 kg)  05/30/17 139 lb (63 kg)  05/28/17 139 lb (63 kg)      Studies/Labs Reviewed:   EKG:  EKG is ordered today.  The ekg ordered today shows atrial paced, ventricular sensed rhythm without ischemic repolarization changes.  AV delay 212 ms, QTC 438 ms Lipid Panel    Component Value Date/Time   CHOL 94 11/24/2010 0837   TRIG 51 11/24/2010 0837   HDL 53 11/24/2010 0837   CHOLHDL 1.8 11/24/2010 0837   VLDL 10 11/24/2010 0837   LDLCALC 31 11/24/2010 0837    ASSESSMENT:    1. Coronary artery disease involving native coronary artery of native heart with angina pectoris ([place])   2. Abnormal cardiovascular stress test   3. Acute on chronic diastolic CHF (congestive heart  failure), NYHA class 1 (HCC)   4. Paroxysmal atrial fibrillation (HCC)   5. Pre-procedure lab exam   6. Long term current use of anticoagulant   7. Second degree atrioventricular block   8. Pacemaker      PLAN:  In order of problems listed above:   1. CAD: Known to have an 70% stenosis in the right coronary artery by cath 2016 (  without perfusion abnormalities on previous study 2016), but now has new ischemic abnormalities on the nuclear stress test, in the anterior wall territory.   2. Abn nuclear study: The perfusion defect distribution does not fit with a typical LAD stenosis and may be related to a diagonal artery problem.  She remains symptomatic and I think we should proceed to coronary angiography.  This procedure has been fully reviewed with the patient and written informed consent has been obtained. 3. CHF: Still has exertional dyspnea, despite no clinical hypervolemia by exam. Most recent echo performed in June 2017 and the recent nuclear study showed normal left ventricular ejection fraction and wall motion. 4. AFib: Recent burden of arrhythmia is low and this cannot explain her recent worsening symptoms.  Does not meet criteria for antiarrhythmic therapy.  CHADSVasc at least 3 (age, CAD, history of CHF). 5. Anticoagulation: Well-tolerated.  Will temporarily stop her anticoagulation for the planned cardiac catheterization 6. SSS: She has close to 100% atrial pacing and atrial heart rate histogram distribution appears to be favorable. 7. 2nd deg AVB: Some of her symptoms are related to loss of ventricular pacing and we reprogrammed her ventricular lead pacing in unipolar mode 8. PPM: Despite increased output on ventricular lead, still has roughly 2.8 years of estimated generator longevity.  We will plan to revise her ventricular lead at the time of generator change out.   Medication Adjustments/Labs and Tests Ordered: Current medicines are reviewed at length with the patient today.   Concerns regarding medicines are outlined above.  Medication changes, Labs and Tests ordered today are listed in the Patient Instructions below. Patient Instructions  Dr Sallyanne Kuster has recommended making the following medication changes: 1. DECREASE Furosemide to 40 mg daily 2. DECREASE Potassium to 20 mEq daily    [place] [address] [place] [address] Dept: [phone] Loc: [place]  07/24/2017  You are scheduled for a Cardiac Catheterization on Wednesday, February 27 with Dr. Shelva Majestic.  1. Please arrive at the [facility] (Main Entrance A) at [facility]: [address] [place], [place] [address] at 11:00 AM (two hours before your procedure to ensure your preparation). Free valet parking service is available.   Special note: Every effort is made to have your procedure done on time. Please understand that emergencies sometimes delay scheduled procedures.  2. Diet: Do not eat or drink anything after midnight prior to your procedure except sips of water to take medications.  3. Labs: You will need to have blood drawn no more than 7 days prior to your procedure. You do not need to be fasting.  4. Medication instructions in preparation for your procedure:  Stop taking Pradaxa (Dabigatran) on Monday, February 25.  Stop taking, Cozaar (Losartan) and Furosemide (Lasix) on Wednesday, February 27.    On the morning of your procedure, take your Aspirin and any morning medicines NOT listed above.  You may use sips of water.  5. Plan for one night stay--bring personal belongings. 6. Bring a current list of your medications and current insurance cards. 7. You MUST have a responsible person to drive you home. 8. Someone MUST be with you the first 24 hours after you arrive home or your discharge will be delayed. 9. Please wear clothes that are easy to get on and off  and wear slip-on shoes.  Thank you for allowing [place] to care for you!   --  [facility]    Signed, Sanda Klein, MD  07/27/2017 3:46 PM    [address], [place], [place]  [address] Phone: [phone]; Fax: [phone]

## 2017-07-25 LAB — TSH: TSH: 0.168 u[IU]/mL — AB (ref 0.450–4.500)

## 2017-07-25 LAB — PROTIME-INR
INR: 1.4 — AB (ref 0.8–1.2)
PROTHROMBIN TIME: 14.1 s — AB (ref 9.1–12.0)

## 2017-07-25 LAB — CBC
Hematocrit: 36.8 % (ref 34.0–46.6)
Hemoglobin: 11.4 g/dL (ref 11.1–15.9)
MCH: 25.2 pg — ABNORMAL LOW (ref 26.6–33.0)
MCHC: 31 g/dL — ABNORMAL LOW (ref 31.5–35.7)
MCV: 81 fL (ref 79–97)
PLATELETS: 373 10*3/uL (ref 150–379)
RBC: 4.52 x10E6/uL (ref 3.77–5.28)
RDW: 15.9 % — ABNORMAL HIGH (ref 12.3–15.4)
WBC: 10.3 10*3/uL (ref 3.4–10.8)

## 2017-07-25 LAB — BASIC METABOLIC PANEL
BUN/Creatinine Ratio: 9 — ABNORMAL LOW (ref 12–28)
BUN: 9 mg/dL (ref 8–27)
CALCIUM: 9.2 mg/dL (ref 8.7–10.3)
CO2: 24 mmol/L (ref 20–29)
Chloride: 101 mmol/L (ref 96–106)
Creatinine, Ser: 0.95 mg/dL (ref 0.57–1.00)
GFR calc Af Amer: 69 mL/min/{1.73_m2} (ref >59)
GFR calc non Af Amer: 60 mL/min/{1.73_m2} (ref >59)
GLUCOSE: 148 mg/dL — AB (ref 65–99)
POTASSIUM: 4.2 mmol/L (ref 3.5–5.2)
SODIUM: 144 mmol/L (ref 134–144)

## 2017-07-25 LAB — APTT: APTT: 70 s — AB (ref 24–33)

## 2017-07-28 ENCOUNTER — Telehealth: Payer: Self-pay | Admitting: Cardiovascular Disease

## 2017-07-28 ENCOUNTER — Other Ambulatory Visit: Payer: Self-pay | Admitting: Cardiovascular Disease

## 2017-07-28 DIAGNOSIS — R9439 Abnormal result of other cardiovascular function study: Secondary | ICD-10-CM

## 2017-07-28 NOTE — Telephone Encounter (Signed)
Patient made aware that we are not sure who called her. It could have been for her cardiac cath on Wednesday. She verbalized her understanding.

## 2017-07-28 NOTE — Telephone Encounter (Signed)
New message  ° ° °Patient returning nurse call.   °

## 2017-07-29 ENCOUNTER — Telehealth: Payer: Self-pay | Admitting: *Deleted

## 2017-07-29 NOTE — Telephone Encounter (Signed)
Pt contacted pre-catheterization scheduled at [facility] for: Wednesday February 27,2019 1 PM Verified arrival time and place: [place] Entrance A/[place] at: 11AM Nothing to eat or drink after midnight. Verified allergies in Epic.  HOLD: No Pradaxa 07/28/17, 07/29/17, until after cath 07/30/17 No furosemide AM of cath No losartan AM of cath No Onglyza AM of cath  Except hold medications AM meds can be  taken pre-cath with sip of water including: ASA am of cath   Confirmed patient has responsible person to drive home post procedure and observe patient for 24 hours: yes

## 2017-07-30 ENCOUNTER — Ambulatory Visit (HOSPITAL_COMMUNITY)
Admission: RE | Disposition: A | Discharge status: Home / Self Care | Payer: Self-pay | Source: Ambulatory Visit | Attending: Cardiovascular Disease

## 2017-07-30 ENCOUNTER — Ambulatory Visit (HOSPITAL_COMMUNITY)
Admission: RE | Admit: 2017-07-30 | Discharge: 2017-07-30 | Disposition: A | Discharge status: Home / Self Care | Payer: Medicare Other | Source: Ambulatory Visit | Attending: Cardiovascular Disease | Admitting: Cardiovascular Disease

## 2017-07-30 DIAGNOSIS — E119 Type 2 diabetes mellitus without complications: Secondary | ICD-10-CM | POA: Diagnosis not present

## 2017-07-30 DIAGNOSIS — I110 Hypertensive heart disease with heart failure: Secondary | ICD-10-CM | POA: Diagnosis not present

## 2017-07-30 DIAGNOSIS — I5032 Chronic diastolic (congestive) heart failure: Secondary | ICD-10-CM | POA: Insufficient documentation

## 2017-07-30 DIAGNOSIS — M1990 Unspecified osteoarthritis, unspecified site: Secondary | ICD-10-CM | POA: Diagnosis not present

## 2017-07-30 DIAGNOSIS — R9439 Abnormal result of other cardiovascular function study: Secondary | ICD-10-CM

## 2017-07-30 DIAGNOSIS — E0590 Thyrotoxicosis, unspecified without thyrotoxic crisis or storm: Secondary | ICD-10-CM | POA: Insufficient documentation

## 2017-07-30 DIAGNOSIS — I441 Atrioventricular block, second degree: Secondary | ICD-10-CM | POA: Diagnosis not present

## 2017-07-30 DIAGNOSIS — Z7901 Long term (current) use of anticoagulants: Secondary | ICD-10-CM | POA: Diagnosis not present

## 2017-07-30 DIAGNOSIS — Z950 Presence of cardiac pacemaker: Secondary | ICD-10-CM | POA: Insufficient documentation

## 2017-07-30 DIAGNOSIS — Z885 Allergy status to narcotic agent status: Secondary | ICD-10-CM | POA: Diagnosis not present

## 2017-07-30 HISTORY — PX: LEFT HEART CATH AND CORONARY ANGIOGRAPHY: CATH118249

## 2017-07-30 LAB — GLUCOSE, CAPILLARY: GLUCOSE-CAPILLARY: 118 mg/dL — AB (ref 65–99)

## 2017-07-30 SURGERY — LEFT HEART CATH AND CORONARY ANGIOGRAPHY
Anesthesia: LOCAL

## 2017-07-30 MED ORDER — SODIUM CHLORIDE 0.9% FLUSH: 3.0000 mL | INTRAVENOUS | Status: DC | PRN

## 2017-07-30 MED ORDER — SODIUM CHLORIDE 0.9 % IV SOLN: INTRAVENOUS | Status: DC

## 2017-07-30 MED ORDER — FENTANYL CITRATE (PF) 100 MCG/2ML IJ SOLN
INTRAMUSCULAR | Status: AC
  Filled 2017-07-30: qty 2

## 2017-07-30 MED ORDER — MIDAZOLAM HCL 2 MG/2ML IJ SOLN
INTRAMUSCULAR | Status: AC
  Filled 2017-07-30: qty 2

## 2017-07-30 MED ORDER — ASPIRIN 81 MG PO CHEW: 81.0000 mg | CHEWABLE_TABLET | ORAL | Status: DC

## 2017-07-30 MED ORDER — HEPARIN SODIUM (PORCINE) 1000 UNIT/ML IJ SOLN
INTRAMUSCULAR | Status: AC
  Filled 2017-07-30: qty 1

## 2017-07-30 MED ORDER — SODIUM CHLORIDE 0.9 % IV SOLN
INTRAVENOUS | Status: DC
  Administered 2017-07-30: 12:00:00 via INTRAVENOUS

## 2017-07-30 MED ORDER — IOPAMIDOL (ISOVUE-370) INJECTION 76%
INTRAVENOUS | Status: AC
  Filled 2017-07-30: qty 100

## 2017-07-30 MED ORDER — LIDOCAINE HCL (PF) 1 % IJ SOLN
INTRAMUSCULAR | Status: DC | PRN
  Administered 2017-07-30: 2 mL

## 2017-07-30 MED ORDER — HEPARIN (PORCINE) IN NACL 2-0.9 UNIT/ML-% IJ SOLN
INTRAMUSCULAR | Status: AC | PRN
  Administered 2017-07-30 (×2): 500 mL

## 2017-07-30 MED ORDER — IOPAMIDOL (ISOVUE-370) INJECTION 76%
INTRAVENOUS | Status: DC | PRN
  Administered 2017-07-30: 60 mL via INTRAVENOUS

## 2017-07-30 MED ORDER — FENTANYL CITRATE (PF) 100 MCG/2ML IJ SOLN
INTRAMUSCULAR | Status: DC | PRN
  Administered 2017-07-30 (×2): 25 ug via INTRAVENOUS

## 2017-07-30 MED ORDER — VERAPAMIL HCL 2.5 MG/ML IV SOLN
INTRAVENOUS | Status: AC
  Filled 2017-07-30: qty 2

## 2017-07-30 MED ORDER — HEPARIN (PORCINE) IN NACL 2-0.9 UNIT/ML-% IJ SOLN
INTRAMUSCULAR | Status: DC | PRN
  Administered 2017-07-30: 10 mL via INTRA_ARTERIAL

## 2017-07-30 MED ORDER — MIDAZOLAM HCL 2 MG/2ML IJ SOLN
INTRAMUSCULAR | Status: DC | PRN
  Administered 2017-07-30: 2 mg via INTRAVENOUS
  Administered 2017-07-30: 1 mg via INTRAVENOUS

## 2017-07-30 MED ORDER — SODIUM CHLORIDE 0.9% FLUSH: 3.0000 mL | Freq: Two times a day (BID) | INTRAVENOUS | Status: DC

## 2017-07-30 MED ORDER — HEPARIN SODIUM (PORCINE) 1000 UNIT/ML IJ SOLN
INTRAMUSCULAR | Status: DC | PRN
  Administered 2017-07-30: 3000 [IU] via INTRAVENOUS

## 2017-07-30 MED ORDER — ONDANSETRON HCL 4 MG/2ML IJ SOLN: 4.0000 mg | Freq: Four times a day (QID) | INTRAMUSCULAR | Status: DC | PRN

## 2017-07-30 MED ORDER — SODIUM CHLORIDE 0.9 % IV SOLN: 250.0000 mL | INTRAVENOUS | Status: DC | PRN

## 2017-07-30 MED ORDER — ACETAMINOPHEN 325 MG PO TABS: 650.0000 mg | ORAL_TABLET | ORAL | Status: DC | PRN

## 2017-07-30 MED ORDER — HEPARIN (PORCINE) IN NACL 2-0.9 UNIT/ML-% IJ SOLN
INTRAMUSCULAR | Status: AC
  Filled 2017-07-30: qty 1000

## 2017-07-30 SURGICAL SUPPLY — 12 items
CATH INFINITI 5FR ANG PIGTAIL (CATHETERS) ×1 IMPLANT
CATH OPTITORQUE TIG 4.0 5F (CATHETERS) ×1 IMPLANT
DEVICE RAD COMP TR BAND LRG (VASCULAR PRODUCTS) ×1 IMPLANT
GLIDESHEATH SLEND SS 6F .021 (SHEATH) ×1 IMPLANT
GUIDEWIRE INQWIRE 1.5J.035X260 (WIRE) IMPLANT
INQWIRE 1.5J .035X260CM (WIRE) ×2
KIT HEART LEFT (KITS) ×2 IMPLANT
PACK CARDIAC CATHETERIZATION (CUSTOM PROCEDURE TRAY) ×2 IMPLANT
SYR MEDRAD MARK V 150ML (SYRINGE) ×2 IMPLANT
TRANSDUCER W/STOPCOCK (MISCELLANEOUS) ×2 IMPLANT
TUBING CIL FLEX 10 FLL-RA (TUBING) ×2 IMPLANT
WIRE HITORQ VERSACORE ST 145CM (WIRE) ×1 IMPLANT

## 2017-07-30 NOTE — Interval H&P Note (Signed)
History and Physical Interval Note:  Cath Lab Visit (complete for each Cath Lab visit)  Clinical Evaluation Leading to the Procedure:   ACS: No.  Non-ACS:    Anginal Classification: CCS II  Anti-ischemic medical therapy: Maximal Therapy (2 or more classes of medications)  Non-Invasive Test Results: Low-risk stress test findings: cardiac mortality <1%/year  Prior CABG: No previous CABG       07/30/2017 12:35 PM  Hailey [name]  has presented today for surgery, with the diagnosis of abnormal nuclear stress test, angina  The various methods of treatment have been discussed with the patient and family. After consideration of risks, benefits and other options for treatment, the patient has consented to  Procedure(s): LEFT HEART CATH AND CORONARY ANGIOGRAPHY (N/A) as a surgical intervention .  The patient's history has been reviewed, patient examined, no change in status, stable for surgery.  I have reviewed the patient's chart and labs.  Questions were answered to the patient's satisfaction.     Shelva Majestic

## 2017-07-30 NOTE — Discharge Instructions (Signed)

## 2017-07-31 ENCOUNTER — Encounter (HOSPITAL_COMMUNITY): Payer: Self-pay | Admitting: Cardiovascular Disease

## 2017-07-31 MED FILL — Heparin Sodium (Porcine) 2 Unit/ML in Sodium Chloride 0.9%: INTRAMUSCULAR | Qty: 1000 | Status: AC

## 2017-08-02 LAB — CUP PACEART REMOTE DEVICE CHECK
Battery Remaining Percentage: 40 %
Battery Voltage: 2.84 V
Brady Statistic AP VP Percent: 23 %
Brady Statistic AP VS Percent: 76 %
Brady Statistic AS VS Percent: 1 %
Brady Statistic RV Percent Paced: 34 %
Date Time Interrogation Session: 20190204085939
Implantable Lead Implant Date: 20120702
Implantable Lead Implant Date: 20120702
Implantable Lead Location: 753860
Implantable Pulse Generator Implant Date: 20120702
Lead Channel Impedance Value: 250 Ohm
Lead Channel Pacing Threshold Amplitude: 0.625 V
Lead Channel Pacing Threshold Amplitude: 1.25 V
Lead Channel Pacing Threshold Pulse Width: 0.8 ms
Lead Channel Sensing Intrinsic Amplitude: 2.3 mV
Lead Channel Setting Pacing Amplitude: 2.5 V
Lead Channel Setting Pacing Pulse Width: 0.8 ms
Lead Channel Setting Sensing Sensitivity: 2 mV
MDC IDC LEAD LOCATION: 753859
MDC IDC MSMT BATTERY REMAINING LONGEVITY: 34 mo
MDC IDC MSMT LEADCHNL RA IMPEDANCE VALUE: 380 Ohm
MDC IDC MSMT LEADCHNL RA PACING THRESHOLD PULSEWIDTH: 0.4 ms
MDC IDC MSMT LEADCHNL RV SENSING INTR AMPL: 4.6 mV
MDC IDC SET LEADCHNL RA PACING AMPLITUDE: 1.625
MDC IDC STAT BRADY AS VP PERCENT: 1 %
MDC IDC STAT BRADY RA PERCENT PACED: 86 %
Pulse Gen Model: 2210
Pulse Gen Serial Number: 7255453

## 2017-08-13 LAB — CUP PACEART INCLINIC DEVICE CHECK
Date Time Interrogation Session: 20190313092550
Implantable Lead Implant Date: 20120702
Implantable Lead Location: 753859
Implantable Pulse Generator Implant Date: 20120702
Lead Channel Setting Pacing Amplitude: 1.625
Lead Channel Setting Sensing Sensitivity: 2 mV
MDC IDC LEAD IMPLANT DT: 20120702
MDC IDC LEAD LOCATION: 753860
MDC IDC SET LEADCHNL RV PACING AMPLITUDE: 2.5 V
MDC IDC SET LEADCHNL RV PACING PULSEWIDTH: 0.8 ms
Pulse Gen Serial Number: 7255453

## 2017-09-08 ENCOUNTER — Encounter: Payer: Self-pay | Admitting: Cardiology

## 2017-09-08 ENCOUNTER — Telehealth: Payer: Self-pay | Admitting: Cardiovascular Disease

## 2017-09-08 ENCOUNTER — Ambulatory Visit (INDEPENDENT_AMBULATORY_CARE_PROVIDER_SITE_OTHER): Payer: Medicare Other | Admitting: Cardiology

## 2017-09-08 DIAGNOSIS — R0600 Dyspnea, unspecified: Secondary | ICD-10-CM | POA: Diagnosis not present

## 2017-09-08 DIAGNOSIS — Z7901 Long term (current) use of anticoagulants: Secondary | ICD-10-CM

## 2017-09-08 DIAGNOSIS — I480 Paroxysmal atrial fibrillation: Secondary | ICD-10-CM | POA: Diagnosis not present

## 2017-09-08 DIAGNOSIS — E0590 Thyrotoxicosis, unspecified without thyrotoxic crisis or storm: Secondary | ICD-10-CM | POA: Diagnosis not present

## 2017-09-08 DIAGNOSIS — I25119 Atherosclerotic heart disease of native coronary artery with unspecified angina pectoris: Secondary | ICD-10-CM

## 2017-09-08 DIAGNOSIS — Z950 Presence of cardiac pacemaker: Secondary | ICD-10-CM

## 2017-09-08 DIAGNOSIS — I2510 Atherosclerotic heart disease of native coronary artery without angina pectoris: Secondary | ICD-10-CM

## 2017-09-08 DIAGNOSIS — I10 Essential (primary) hypertension: Secondary | ICD-10-CM | POA: Diagnosis not present

## 2017-09-08 NOTE — Assessment & Plan Note (Signed)
TSH was low in Feb- she is on PTU, followed by Dr Maudie Mercury

## 2017-09-08 NOTE — Assessment & Plan Note (Signed)
Good control

## 2017-09-08 NOTE — Assessment & Plan Note (Signed)
Pradaxa 

## 2017-09-08 NOTE — Assessment & Plan Note (Signed)
PAF- ? If that's what she is feeling as sensation of SOB

## 2017-09-08 NOTE — Assessment & Plan Note (Signed)
Minor CAD- 20% RCA at cath Feb 2019 (after an abnormal Myoview suggested anterior ischemia).

## 2017-09-08 NOTE — Telephone Encounter (Signed)
Patient has been having increased shortness of breath over the last couple of weeks on exertion. Scheduled her appointment today with Doreene Burke PA

## 2017-09-08 NOTE — Patient Instructions (Signed)
Medication Instructions:  No medication changes    Follow-Up: Your physician recommends that you schedule a follow-up appointment in: PCP and 3 months with Dr. Sallyanne Kuster   Any Other Special Instructions Will Be Listed Below (If Applicable).     If you need a refill on your cardiac medications before your next appointment, please call your pharmacy.

## 2017-09-08 NOTE — Assessment & Plan Note (Signed)
implanted July 2012 for tachycardia bradycardia and second-degree atrioventricular block

## 2017-09-08 NOTE — Progress Notes (Signed)
09/08/2017 Hailey [name]   1945-10-12  [id number]  Primary Physician Jani Gravel, MD Primary Cardiologist: Dr Sallyanne Kuster  HPI:  72 y/o  female with paroxysmal atrial fibrillation (s/p RF ablation procedures),s/p dual-chamber permanent pacemaker (St. Jude Accent 2012 for tachycardia-bradycardia syndrome and 2:1 atrioventricular block), CAD (70% stenosis in the mid right coronary artery without ischemia on nuclear perfusion imaging in 2012 and March 2016), chronic diastolic heart failure (history of acute decompensation on Multaq), type 2 diabetes mellitus, hypertension, and hyperthyroidism-on PTU. She has been having increased DOE. A Myoview was abnormal suggesting anterior ischemia. Cath done in Feb 2019 showed no significant CAD- 20% RCA, 10% Dx, and normal LVF. She recently called with complaints interpreted as increased SOB and she was added on to my schedule. The pt describes a sensation of having to take an extra breath from time to time. She was concerned she may require a higher dose of Lasix, it had been decreased previously to her current dose.    Current Outpatient Medications  Medication Sig Dispense Refill  . Cholecalciferol (VITAMIN D3) 1000 UNITS CAPS Take 1 capsule by mouth daily.     . Cyanocobalamin (VITAMIN B12 PO) Take 1 tablet by mouth daily as needed (for energy).     . dabigatran (PRADAXA) 150 MG CAPS capsule Take 1 capsule (150 mg total) 2 (two) times daily by mouth. 60 capsule 0  . DILT-XR 180 MG 24 hr capsule TAKE 1 CAPSULE BY MOUTH ONCE DAILY (Patient taking differently: TAKE 180 MG BY MOUTH ONCE DAILY) 90 capsule 1  . furosemide (LASIX) 40 MG tablet Take 1 tablet (40 mg total) by mouth daily. 90 tablet 3  . loperamide (IMODIUM A-D) 2 MG tablet Take 2 mg by mouth as needed for diarrhea or loose stools.     Marland Kitchen losartan (COZAAR) 25 MG tablet Take 25 mg by mouth daily.   0  . metoprolol tartrate (LOPRESSOR) 50 MG tablet Take 1 tablet (50 mg total) by mouth 2 (two) times  daily. 180 tablet 3  . OVER THE COUNTER MEDICATION Take 1 tablet by mouth daily. Claritin    . oxybutynin (DITROPAN) 5 MG tablet Take 5 mg by mouth daily.   4  . pantoprazole (PROTONIX) 40 MG tablet Take 40 mg by mouth daily.    . potassium chloride SA (KLOR-CON M20) 20 MEQ tablet Take 1 tablet (20 mEq total) by mouth daily. (Patient taking differently: Take 10 mEq by mouth 2 (two) times daily. ) 90 tablet 3  . propylthiouracil (PTU) 50 MG tablet Take 50-100 mg by mouth See admin instructions. Take 100 mg by mouth in the morning and take 50 mg by mouth at bedtime    . saxagliptin HCl (ONGLYZA) 5 MG TABS tablet Take 5 mg by mouth daily.     No current facility-administered medications for this visit.     Allergies  Allergen Reactions  . Codeine Nausea And Vomiting and Nausea Only  . Ketorolac Nausea And Vomiting, Nausea Only and Other (See Comments)    "pt told to never take this med"  . Meperidine Nausea Only  . Demerol Nausea And Vomiting  . Dronedarone Other (See Comments)    Increased fluid retention  . Cyclobenzaprine Nausea And Vomiting    Past Medical History:  Diagnosis Date  . Acute on chronic diastolic CHF (congestive heart failure), NYHA class 1 ([place]) 04/29/2013  . Allergic rhinitis   . Brady-tachy syndrome (HCC)    s/p STJ dual chamber PPM  by Dr Sallyanne Kuster  . CAD (coronary artery disease)    70% RCA stenosis, treated medically  . Diabetes mellitus ([place])   . DJD (degenerative joint disease)   . Dysrhythmia    Paroxysmal Atrial Fib.  . Hypertension   . Hyperthyroidism   . Paroxysmal atrial fibrillation (HCC)   . Polio    as a child  . PONV (postoperative nausea and vomiting)   . Presence of permanent cardiac pacemaker     Social History   Socioeconomic History  . Marital status: Married    Spouse name: [name]  . Number of children: Not on file  . Years of education: Not on file  . Highest education level: Not on file  Occupational History  . Occupation:  housewife  Social Needs  . Financial resource strain: Not on file  . Food insecurity:    Worry: Not on file    Inability: Not on file  . Transportation needs:    Medical: Not on file    Non-medical: Not on file  Tobacco Use  . Smoking status: Never Smoker  . Smokeless tobacco: Never Used  Substance and Sexual Activity  . Alcohol use: No  . Drug use: No  . Sexual activity: Not Currently    Birth control/protection: Abstinence  Lifestyle  . Physical activity:    Days per week: Not on file    Minutes per session: Not on file  . Stress: Not on file  Relationships  . Social connections:    Talks on phone: Not on file    Gets together: Not on file    Attends religious service: Not on file    Active member of club or organization: Not on file    Attends meetings of clubs or organizations: Not on file    Relationship status: Not on file  . Intimate partner violence:    Fear of current or ex partner: Not on file    Emotionally abused: Not on file    Physically abused: Not on file    Forced sexual activity: Not on file  Other Topics Concern  . Not on file  Social History Narrative   Lives in [place].  Retired.     Family History  Problem Relation Age of Onset  . Heart disease Sister   . Breast cancer Sister   . Breast cancer Sister      Review of Systems: General: negative for chills, fever, night sweats or weight changes.  Cardiovascular: negative for chest pain, dyspnea on exertion, edema, orthopnea, palpitations, paroxysmal nocturnal dyspnea or shortness of breath Dermatological: negative for rash Respiratory: negative for cough or wheezing Urologic: negative for hematuria Abdominal: negative for nausea, vomiting, diarrhea, bright red blood per rectum, melena, or hematemesis Neurologic: negative for visual changes, syncope, or dizziness All other systems reviewed and are otherwise negative except as noted above.    Blood pressure 122/60, pulse 73, height 4'  11" (1.499 m), weight 135 lb 9.6 oz (61.5 kg).  General appearance: alert, cooperative and no distress Neck: no carotid bruit and no JVD Lungs: decreased Rt base Heart: regular rate and rhythm Extremities: trace edema Skin: Skin color, texture, turgor normal. No rashes or lesions Neurologic: Grossly normal  EKG Paced  ASSESSMENT AND PLAN:   Dyspnea Pt added on for me to see- she complains of having to take an "extra breath" at times  CAD (coronary artery disease) Minor CAD- 20% RCA at cath Feb 2019 (after an abnormal Myoview suggested anterior ischemia).  Atrial fibrillation PAF- ? If that's what she is feeling as sensation of SOB  Pacemaker St. Jude accent DR RF , July 2012 implanted July 2012 for tachycardia bradycardia and second-degree atrioventricular block  Long term current use of anticoagulant Pradaxa  Hyperthyroidism TSH was low in Feb- she is on PTU, followed by Dr Maudie Mercury  Essential hypertension Good control   PLAN  I'm not sure why she would have diastolic CHF and I don't think she has CHF on exam. I wonder if she may be noticing intermittent PAF.  I did not change her medication. I reassured her her heart function is normal and she has no sifgnificant blockages in the arteries to her heart. I did suggest she f/u with Dr Maudie Mercury- her TSH was 0.168 in Feb  Nikesh Teschner PA-C 09/08/2017 2:51 PM

## 2017-09-08 NOTE — Telephone Encounter (Signed)
New message   Pt c/o Shortness Of Breath: STAT if SOB developed within the last 24 hours or pt is noticeably SOB on the phone  1. Are you currently SOB (can you hear that pt is SOB on the phone)? No  2. How long have you been experiencing SOB? 2 weeks  3. Are you SOB when sitting or when up moving around? Sitting and moving around  4. Are you currently experiencing any other symptoms? Tired, SOB at night when laying down

## 2017-09-08 NOTE — Assessment & Plan Note (Signed)
Pt added on for me to see- she complains of having to take an "extra breath" at times

## 2017-09-11 DIAGNOSIS — Z1389 Encounter for screening for other disorder: Secondary | ICD-10-CM | POA: Diagnosis not present

## 2017-09-11 DIAGNOSIS — R0609 Other forms of dyspnea: Secondary | ICD-10-CM | POA: Diagnosis not present

## 2017-09-11 DIAGNOSIS — E789 Disorder of lipoprotein metabolism, unspecified: Secondary | ICD-10-CM | POA: Diagnosis not present

## 2017-09-11 DIAGNOSIS — E119 Type 2 diabetes mellitus without complications: Secondary | ICD-10-CM | POA: Diagnosis not present

## 2017-09-11 DIAGNOSIS — E0590 Thyrotoxicosis, unspecified without thyrotoxic crisis or storm: Secondary | ICD-10-CM | POA: Diagnosis not present

## 2017-09-11 DIAGNOSIS — J90 Pleural effusion, not elsewhere classified: Secondary | ICD-10-CM | POA: Diagnosis not present

## 2017-09-11 DIAGNOSIS — I10 Essential (primary) hypertension: Secondary | ICD-10-CM | POA: Diagnosis not present

## 2017-09-12 ENCOUNTER — Other Ambulatory Visit (HOSPITAL_COMMUNITY): Payer: Self-pay | Admitting: Respiratory Therapy

## 2017-09-12 DIAGNOSIS — R1310 Dysphagia, unspecified: Secondary | ICD-10-CM

## 2017-09-25 ENCOUNTER — Inpatient Hospital Stay (HOSPITAL_COMMUNITY): Admission: RE | Admit: 2017-09-25 | Payer: Medicare Other | Source: Ambulatory Visit

## 2017-09-29 ENCOUNTER — Ambulatory Visit (HOSPITAL_COMMUNITY)
Admission: RE | Admit: 2017-09-29 | Discharge: 2017-09-29 | Disposition: A | Discharge status: Home / Self Care | Payer: Medicare Other | Source: Ambulatory Visit | Attending: Internal Medicine | Admitting: Internal Medicine

## 2017-09-29 DIAGNOSIS — J449 Chronic obstructive pulmonary disease, unspecified: Secondary | ICD-10-CM | POA: Diagnosis not present

## 2017-09-29 DIAGNOSIS — R1310 Dysphagia, unspecified: Secondary | ICD-10-CM | POA: Diagnosis not present

## 2017-09-29 LAB — PULMONARY FUNCTION TEST
DL/VA % PRED: 93 %
DL/VA: 3.81 ml/min/mmHg/L
DLCO UNC % PRED: 54 %
DLCO UNC: 9.6 ml/min/mmHg
FEF 25-75 Pre: 1.2 L/sec
FEF2575-%Pred-Pre: 79 %
FEV1-%Pred-Pre: 64 %
FEV1-Pre: 1.11 L
FEV1FVC-%Pred-Pre: 110 %
FEV6-%PRED-PRE: 61 %
FEV6-Pre: 1.35 L
FEV6FVC-%PRED-PRE: 105 %
FVC-%Pred-Pre: 58 %
FVC-Pre: 1.35 L
PRE FEV1/FVC RATIO: 83 %
PRE FEV6/FVC RATIO: 100 %
RV % pred: 111 %
RV: 2.21 L
TLC % pred: 83 %
TLC: 3.57 L

## 2017-10-06 ENCOUNTER — Ambulatory Visit (INDEPENDENT_AMBULATORY_CARE_PROVIDER_SITE_OTHER): Payer: Medicare Other | Admitting: *Deleted

## 2017-10-06 DIAGNOSIS — I441 Atrioventricular block, second degree: Secondary | ICD-10-CM

## 2017-10-06 NOTE — Progress Notes (Signed)
Remote pacemaker transmission.   

## 2017-10-08 ENCOUNTER — Encounter: Payer: Self-pay | Admitting: Cardiology

## 2017-10-20 LAB — CUP PACEART REMOTE DEVICE CHECK
Brady Statistic AP VS Percent: 80 %
Brady Statistic AS VP Percent: 1 %
Brady Statistic AS VS Percent: 1 %
Date Time Interrogation Session: 20190506093149
Implantable Lead Implant Date: 20120702
Implantable Lead Location: 753859
Implantable Lead Location: 753860
Lead Channel Impedance Value: 250 Ohm
Lead Channel Pacing Threshold Amplitude: 0.5 V
Lead Channel Pacing Threshold Amplitude: 1.25 V
Lead Channel Pacing Threshold Pulse Width: 0.4 ms
Lead Channel Sensing Intrinsic Amplitude: 4.8 mV
Lead Channel Setting Pacing Amplitude: 1.5 V
Lead Channel Setting Pacing Pulse Width: 0.8 ms
Lead Channel Setting Sensing Sensitivity: 2 mV
MDC IDC LEAD IMPLANT DT: 20120702
MDC IDC MSMT BATTERY REMAINING LONGEVITY: 31 mo
MDC IDC MSMT BATTERY REMAINING PERCENTAGE: 35 %
MDC IDC MSMT BATTERY VOLTAGE: 2.83 V
MDC IDC MSMT LEADCHNL RA IMPEDANCE VALUE: 350 Ohm
MDC IDC MSMT LEADCHNL RA SENSING INTR AMPL: 2.5 mV
MDC IDC MSMT LEADCHNL RV PACING THRESHOLD PULSEWIDTH: 0.8 ms
MDC IDC PG IMPLANT DT: 20120702
MDC IDC PG SERIAL: 7255453
MDC IDC SET LEADCHNL RV PACING AMPLITUDE: 2.5 V
MDC IDC STAT BRADY AP VP PERCENT: 20 %
MDC IDC STAT BRADY RA PERCENT PACED: 86 %
MDC IDC STAT BRADY RV PERCENT PACED: 30 %
Pulse Gen Model: 2210

## 2017-10-21 DIAGNOSIS — I10 Essential (primary) hypertension: Secondary | ICD-10-CM | POA: Diagnosis not present

## 2017-10-21 DIAGNOSIS — M5136 Other intervertebral disc degeneration, lumbar region: Secondary | ICD-10-CM | POA: Diagnosis not present

## 2017-10-21 DIAGNOSIS — E119 Type 2 diabetes mellitus without complications: Secondary | ICD-10-CM | POA: Diagnosis not present

## 2017-10-21 DIAGNOSIS — M419 Scoliosis, unspecified: Secondary | ICD-10-CM | POA: Diagnosis not present

## 2017-10-21 DIAGNOSIS — E0590 Thyrotoxicosis, unspecified without thyrotoxic crisis or storm: Secondary | ICD-10-CM | POA: Diagnosis not present

## 2017-10-21 DIAGNOSIS — M47816 Spondylosis without myelopathy or radiculopathy, lumbar region: Secondary | ICD-10-CM | POA: Diagnosis not present

## 2017-10-28 DIAGNOSIS — I10 Essential (primary) hypertension: Secondary | ICD-10-CM | POA: Diagnosis not present

## 2017-10-28 DIAGNOSIS — E0590 Thyrotoxicosis, unspecified without thyrotoxic crisis or storm: Secondary | ICD-10-CM | POA: Diagnosis not present

## 2017-10-28 DIAGNOSIS — E119 Type 2 diabetes mellitus without complications: Secondary | ICD-10-CM | POA: Diagnosis not present

## 2017-11-17 ENCOUNTER — Other Ambulatory Visit: Payer: Self-pay | Admitting: Internal Medicine

## 2017-12-08 ENCOUNTER — Encounter: Payer: Self-pay | Admitting: Cardiovascular Disease

## 2017-12-08 ENCOUNTER — Ambulatory Visit (INDEPENDENT_AMBULATORY_CARE_PROVIDER_SITE_OTHER): Payer: Medicare Other | Admitting: Cardiovascular Disease

## 2017-12-08 VITALS — BP 125/73 | HR 84 | Ht 59.0 in | Wt 133.2 lb

## 2017-12-08 DIAGNOSIS — I495 Sick sinus syndrome: Secondary | ICD-10-CM | POA: Diagnosis not present

## 2017-12-08 DIAGNOSIS — I25119 Atherosclerotic heart disease of native coronary artery with unspecified angina pectoris: Secondary | ICD-10-CM

## 2017-12-08 DIAGNOSIS — I2510 Atherosclerotic heart disease of native coronary artery without angina pectoris: Secondary | ICD-10-CM

## 2017-12-08 DIAGNOSIS — Z950 Presence of cardiac pacemaker: Secondary | ICD-10-CM | POA: Diagnosis not present

## 2017-12-08 DIAGNOSIS — Z7901 Long term (current) use of anticoagulants: Secondary | ICD-10-CM

## 2017-12-08 DIAGNOSIS — I480 Paroxysmal atrial fibrillation: Secondary | ICD-10-CM

## 2017-12-08 DIAGNOSIS — I441 Atrioventricular block, second degree: Secondary | ICD-10-CM

## 2017-12-08 DIAGNOSIS — I5032 Chronic diastolic (congestive) heart failure: Secondary | ICD-10-CM

## 2017-12-08 NOTE — Patient Instructions (Signed)
Dr Sallyanne Kuster recommends that you continue on your current medications as directed. Please refer to the Current Medication list given to you today.  Remote monitoring is used to monitor your Pacemaker or ICD from home. This monitoring reduces the number of office visits required to check your device to one time per year. It allows [place] to keep an eye on the functioning of your device to ensure it is working properly. You are scheduled for a device check from home on Monday, August 5th, 2019. You may send your transmission at any time that day. If you have a wireless device, the transmission will be sent automatically. After your physician reviews your transmission, you will receive a notification with your next transmission date.  To improve our patient care and to more adequately follow your device, [facility] has decided, as a practice, to start following each patient four times a year with your home monitor. This means that you may experience a remote appointment that is close to an in-office appointment with your physician. Your insurance will apply at the same rate as other remote monitoring transmissions.  Dr Sallyanne Kuster recommends that you schedule a follow-up appointment in 12 months with a pacemaker check. You will receive a reminder letter in the mail two months in advance. If you don't receive a letter, please call our office to schedule the follow-up appointment.  If you need a refill on your cardiac medications before your next appointment, please call your pharmacy.

## 2017-12-08 NOTE — Progress Notes (Signed)
Patient ID: Hailey [name], female   DOB: Jul 29, 1945, 72 y.o.   MRN: [id number]    Cardiology Office Note    Date:  12/14/2017   ID:  Hailey [name], Hailey [name] 10-Sep-1945, MRN [id number]  PCP:  Jani Gravel, MD  Cardiologist:  Thompson Grayer, MD;  Sanda Klein, MD   Chief Complaint  Patient presents with  . Atrial Fibrillation  . Pacemaker Check    History of Present Illness:  Hailey [name] is a 72 y.o. female with paroxysmal atrial fibrillation (s/p RF ablation procedures),s/p dual-chamber permanent pacemaker (St. Jude Accent 2012 for tachycardia-bradycardia syndrome and 2:1 atrioventricular block), CAD (nonobstructive by cath February 2019), chronic diastolic heart failure (history of acute decompensation on Multaq), type 2 diabetes mellitus, hypertension, good lipid profile without therapy, hyperthyroidism (on propylthiouracil).  She underwent cardiac catheterization in February with favorable findings   Ost 1st Diag to 1st Diag lesion is 10% stenosed.  Mid RCA lesion is 20% stenosed.  The left ventricular systolic function is normal.  LV end diastolic pressure is normal.   No significant coronary obstructive disease with mild smooth 10% narrowing in the proximal to midportion of the first diagonal branch of the LAD; normal left circumflex coronary artery; and smooth 20% mid RCA narrowing.  Normal LV function without focal segmental wall motion abnormalities.  LVEDP 7.  She felt very poorly for a while when she lost ventricular capture with the lead programmed and bipolar configuration.  Switched to unipolar configuration with acceptable threshold (today 1.25 V at 0.8 ms).  She has NYHA functional class II dyspnea and denies angina pectoris either at rest or with exertion.  She is had a little bit of swelling in her right ankle off and on.  She is unaware of the arrhythmia when it occurs but has 14% atrial fibrillation by device check.  At her last device check she was in atrial  fibrillation for about 3 days.  Today she is in regular rhythm.  She has not had any embolic events, focal neurological symptoms or bleeding problems on Pradaxa.  She denies any falls or injuries.  Her nuclear stress test showed evidence of anterior wall ischemia, although a relatively small distribution and sparing the apex. When she presented with an acute coronary event years ago her primary symptom was dyspnea. She has never really had angina.  Her pacemaker is a [employer]er[employer] retail implanted in 2012.  Estimated device longevity is about 2-1/2 years.  She has 86% atrial pacing (the only time she does not pace the atrium is when she is in atrial fibrillation 14% of the time), 31% ventricular pacing.  Atrial lead parameters are all excellent.  As mentioned the ventricular lead threshold is high in the bipolar configuration, but is acceptable in unipolar configuration.  R wave sensing and ventricular lead impedance remain normal and stable.  Past Medical History:  Diagnosis Date  . Acute on chronic diastolic CHF (congestive heart failure), NYHA class 1 ([place]) 04/29/2013  . Allergic rhinitis   . Brady-tachy syndrome (HCC)    s/p STJ dual chamber PPM by Dr Sallyanne Kuster  . CAD (coronary artery disease)    70% RCA stenosis, treated medically  . Diabetes mellitus ([place])   . DJD (degenerative joint disease)   . Dysrhythmia    Paroxysmal Atrial Fib.  . Hypertension   . Hyperthyroidism   . Paroxysmal atrial fibrillation (HCC)   . Polio    as a child  . PONV (postoperative nausea and vomiting)   .  Presence of permanent cardiac pacemaker     Past Surgical History:  Procedure Laterality Date  . arm surgery d/t fx Right 96  . ATRIAL FIBRILLATION ABLATION  07/31/2012; 08-10-13   PVI by Dr Rayann Heman  . ATRIAL FIBRILLATION ABLATION N/A 07/31/2012   Procedure: ATRIAL FIBRILLATION ABLATION;  Surgeon: Thompson Grayer, MD;  Location: [facility] CATH LAB;  Service: Cardiovascular;  Laterality: N/A;  . ATRIAL FIBRILLATION  ABLATION N/A 08/10/2013   Procedure: ATRIAL FIBRILLATION ABLATION;  Surgeon: Coralyn Mark, MD;  Location: [facility] CATH LAB;  Service: Cardiovascular;  Laterality: N/A;  . CARDIAC CATHETERIZATION  12/03/2010   single vessel mid RCA  . CHOLECYSTECTOMY  1994  . COLONOSCOPY WITH PROPOFOL N/A 03/21/2015   Procedure: COLONOSCOPY WITH PROPOFOL;  Surgeon: Juanita Craver, MD;  Location: [facility] ENDOSCOPY;  Service: Endoscopy;  Laterality: N/A;  . LEFT HEART CATH AND CORONARY ANGIOGRAPHY N/A 07/30/2017   Procedure: LEFT HEART CATH AND CORONARY ANGIOGRAPHY;  Surgeon: Troy Sine, MD;  Location: [facility] CV LAB;  Service: Cardiovascular;  Laterality: N/A;  . LUMBAR LAMINECTOMY/DECOMPRESSION MICRODISCECTOMY Right 12/23/2012   Procedure: Right L5-S1 Laminotomy for resection of synovial cyst;  Surgeon: Hosie Spangle, MD;  Location: [facility] NEURO ORS;  Service: Neurosurgery;  Laterality: Right;  Right Lumbar five-sacral one Laminotomy for resection of synovial cyst  . NM MYOCAR PERF WALL MOTION  12/20/2010   normal  . PACEMAKER INSERTION  12/03/10   SJM Accent DR RF implanted by Dr Sallyanne Kuster  . TEE WITHOUT CARDIOVERSION N/A 07/30/2012   Procedure: TRANSESOPHAGEAL ECHOCARDIOGRAM (TEE);  Surgeon: Sanda Klein, MD;  Location[facility]OSCOPY;  Service: Cardiovascular;  Laterality: N/A;  h&p in file-[place]  . TEE WITHOUT CARDIOVERSION N/A 08/09/2013   Procedure: TRANSESOPHAGEAL ECHOCARDIOGRAM (TEE);  Surgeon: Lelon Perla, MD;  Location: [facility] ENDOSCOPY;  Service: Cardiovascular;  Laterality: N/A;  . TOTAL ABDOMINAL HYSTERECTOMY  1981    Current Medications: Outpatient Medications Prior to Visit  Medication Sig Dispense Refill  . Cholecalciferol (VITAMIN D3) 1000 UNITS CAPS Take 1 capsule by mouth daily.     . Cyanocobalamin (VITAMIN B12 PO) Take 1 tablet by mouth daily as needed (for energy).     . furosemide (LASIX) 40 MG tablet Take 1 tablet (40 mg total) by mouth daily. 90 tablet 3  . loperamide (IMODIUM A-D) 2 MG tablet  Take 2 mg by mouth as needed for diarrhea or loose stools.     Marland Kitchen losartan (COZAAR) 25 MG tablet Take 25 mg by mouth daily.   0  . metoprolol tartrate (LOPRESSOR) 50 MG tablet Take 1 tablet (50 mg total) by mouth 2 (two) times daily. 180 tablet 3  . OVER THE COUNTER MEDICATION Take 1 tablet by mouth daily. Claritin    . oxybutynin (DITROPAN) 5 MG tablet Take 5 mg by mouth daily.   4  . pantoprazole (PROTONIX) 40 MG tablet Take 40 mg by mouth daily.    . potassium chloride SA (KLOR-CON M20) 20 MEQ tablet Take 1 tablet (20 mEq total) by mouth daily. (Patient taking differently: Take 10 mEq by mouth 2 (two) times daily. ) 90 tablet 3  . PRADAXA 150 MG CAPS capsule TAKE 1 CAPSULE BY MOUTH TWICE DAILY 180 capsule 1  . propylthiouracil (PTU) 50 MG tablet Take 50-100 mg by mouth See admin instructions. Take 100 mg by mouth in the morning and take 50 mg by mouth at bedtime    . saxagliptin HCl (ONGLYZA) 5 MG TABS tablet Take 5 mg by mouth  daily.    . dabigatran (PRADAXA) 150 MG CAPS capsule Take 1 capsule (150 mg total) 2 (two) times daily by mouth. 60 capsule 0  . DILT-XR 180 MG 24 hr capsule TAKE 1 CAPSULE BY MOUTH ONCE DAILY (Patient taking differently: TAKE 180 MG BY MOUTH ONCE DAILY) 90 capsule 1   No facility-administered medications prior to visit.      Allergies:   Codeine; Ketorolac; Meperidine; Demerol; Dronedarone; and Cyclobenzaprine   Social History   Socioeconomic History  . Marital status: Married    Spouse name: [name]  . Number of children: Not on file  . Years of education: Not on file  . Highest education level: Not on file  Occupational History  . Occupation: housewife  Social Needs  . Financial resource strain: Not on file  . Food insecurity:    Worry: Not on file    Inability: Not on file  . Transportation needs:    Medical: Not on file    Non-medical: Not on file  Tobacco Use  . Smoking status: Never Smoker  . Smokeless tobacco: Never Used  Substance and Sexual  Activity  . Alcohol use: No  . Drug use: No  . Sexual activity: Not Currently    Birth control/protection: Abstinence  Lifestyle  . Physical activity:    Days per week: Not on file    Minutes per session: Not on file  . Stress: Not on file  Relationships  . Social connections:    Talks on phone: Not on file    Gets together: Not on file    Attends religious service: Not on file    Active member of club or organization: Not on file    Attends meetings of clubs or organizations: Not on file    Relationship status: Not on file  Other Topics Concern  . Not on file  Social History Narrative   Lives in [place].  Retired.     Family History:  The patient's family history includes Breast cancer in her sister and sister; Heart disease in her sister.   ROS:   Please see the history of present illness.    ROS All other systems reviewed and are negative.   PHYSICAL EXAM:   VS:  BP 125/73   Pulse 84   Ht 4\' 11"  (1.499 m)   Wt 133 lb 3.2 oz (60.4 kg)   SpO2 93%   BMI 26.90 kg/m     General: Alert, oriented x3, no distress Head: no evidence of trauma, PERRL, EOMI, no exophtalmos or lid lag, no myxedema, no xanthelasma; normal ears, nose and oropharynx Neck: normal jugular venous pulsations and no hepatojugular reflux; brisk carotid pulses without delay and no carotid bruits Chest: clear to auscultation, no signs of consolidation by percussion or palpation, normal fremitus, symmetrical and full respiratory excursions Cardiovascular: normal position and quality of the apical impulse, regular rhythm, normal first and second heart sounds, no murmurs, rubs or gallops Abdomen: no tenderness or distention, no masses by palpation, no abnormal pulsatility or arterial bruits, normal bowel sounds, no hepatosplenomegaly Extremities: no clubbing, cyanosis or edema; 2+ radial, ulnar and brachial pulses bilaterally; 2+ right femoral, posterior tibial and dorsalis pedis pulses; 2+ left femoral,  posterior tibial and dorsalis pedis pulses; no subclavian or femoral bruits Neurological: grossly nonfocal Psych: Normal mood and affect Healthy pacemaker site.  Wt Readings from Last 3 Encounters:  12/08/17 133 lb 3.2 oz (60.4 kg)  09/08/17 135 lb 9.6 oz (61.5 kg)  07/30/17 137 lb (62.1 kg)      Studies/Labs Reviewed:   CATH 07/30/2017:  Ost 1st Diag to 1st Diag lesion is 10% stenosed.  Mid RCA lesion is 20% stenosed.  The left ventricular systolic function is normal.  LV end diastolic pressure is normal.   No significant coronary obstructive disease with mild smooth 10% narrowing in the proximal to midportion of the first diagonal branch of the LAD; normal left circumflex coronary artery; and smooth 20% mid RCA narrowing.  Normal LV function without focal segmental wall motion abnormalities.  LVEDP 7.  EKG:  EKG is not ordered today.    Lipid Panel    Component Value Date/Time   CHOL 94 11/24/2010 0837   TRIG 51 11/24/2010 0837   HDL 53 11/24/2010 0837   CHOLHDL 1.8 11/24/2010 0837   VLDL 10 11/24/2010 0837   LDLCALC 31 11/24/2010 0837    ASSESSMENT:    1. Chronic diastolic heart failure (HCC)   2. Paroxysmal atrial fibrillation ([place])   3. Long term (current) use of anticoagulants   4. SSS (sick sinus syndrome) ([place])   5. Second degree AV block   6. Pacemaker   7. Coronary artery disease involving native coronary artery of native heart without angina pectoris      PLAN:  In order of problems listed above:   1. CHF: Still has exertional dyspnea, despite no clinical hypervolemia by exam.  She had normal left ventricular systolic function and normal filling pressures at the time of a cardiac cath.  In fact LVEDP was low at 7.  Symptoms are unlikely to be related to heart failure. 2. AFib: There has never been an obvious correlation between the arrhythmia and symptoms.  When it occurs, the ventricular rate is well controlled.  CHADSVasc at least 3 (age, CAD,  history of CHF). 3. Anticoagulation: Well-tolerated without bleeding complications 4. SSS: She has close to 100% atrial pacing when she is not in atrial fibrillation, and atrial heart rate histogram distribution appears to be favorable. 5. 2nd deg AVB: She felt very poorly when she lost ventricular capture.  Better after we switched the ventricular pacing configuration to unipolar. 6. PPM: We may have to revise her ventricular lead at the time of generator change out anticipated in the next 2-1/2 years or so. 7. CAD: Denies angina pectoris.  Nonobstructive disease by cath in February of this year.   Medication Adjustments/Labs and Tests Ordered: Current medicines are reviewed at length with the patient today.  Concerns regarding medicines are outlined above.  Medication changes, Labs and Tests ordered today are listed in the Patient Instructions below. Patient Instructions  Dr Sallyanne Kuster recommends that you continue on your current medications as directed. Please refer to the Current Medication list given to you today.  Remote monitoring is used to monitor your Pacemaker or ICD from home. This monitoring reduces the number of office visits required to check your device to one time per year. It allows [place] to keep an eye on the functioning of your device to ensure it is working properly. You are scheduled for a device check from home on Monday, August 5th, 2019. You may send your transmission at any time that day. If you have a wireless device, the transmission will be sent automatically. After your physician reviews your transmission, you will receive a notification with your next transmission date.  To improve our patient care and to more adequately follow your device, [facility] has decided, as a practice, to  start following each patient four times a year with your home monitor. This means that you may experience a remote appointment that is close to an in-office appointment with your physician.  Your insurance will apply at the same rate as other remote monitoring transmissions.  Dr Sallyanne Kuster recommends that you schedule a follow-up appointment in 12 months with a pacemaker check. You will receive a reminder letter in the mail two months in advance. If you don't receive a letter, please call our office to schedule the follow-up appointment.  If you need a refill on your cardiac medications before your next appointment, please call your pharmacy.    Signed, Sanda Klein, MD  12/14/2017 12:01 PM    [facility] [place], [place], [place]  [address] Phone: [phone]; Fax: [phone]

## 2017-12-17 DIAGNOSIS — H5203 Hypermetropia, bilateral: Secondary | ICD-10-CM | POA: Diagnosis not present

## 2017-12-17 DIAGNOSIS — H25813 Combined forms of age-related cataract, bilateral: Secondary | ICD-10-CM | POA: Diagnosis not present

## 2017-12-17 DIAGNOSIS — H52223 Regular astigmatism, bilateral: Secondary | ICD-10-CM | POA: Diagnosis not present

## 2017-12-17 DIAGNOSIS — E119 Type 2 diabetes mellitus without complications: Secondary | ICD-10-CM | POA: Diagnosis not present

## 2018-01-05 ENCOUNTER — Other Ambulatory Visit: Payer: Self-pay | Admitting: Cardiovascular Disease

## 2018-01-05 ENCOUNTER — Ambulatory Visit (INDEPENDENT_AMBULATORY_CARE_PROVIDER_SITE_OTHER): Payer: Medicare Other | Admitting: *Deleted

## 2018-01-05 DIAGNOSIS — I480 Paroxysmal atrial fibrillation: Secondary | ICD-10-CM

## 2018-01-06 NOTE — Progress Notes (Signed)
Remote pacemaker transmission.   

## 2018-01-22 LAB — CUP PACEART INCLINIC DEVICE CHECK
Implantable Lead Implant Date: 20120702
Lead Channel Setting Pacing Amplitude: 2.5 V
MDC IDC LEAD IMPLANT DT: 20120702
MDC IDC LEAD LOCATION: 753859
MDC IDC LEAD LOCATION: 753860
MDC IDC PG IMPLANT DT: 20120702
MDC IDC PG SERIAL: 7255453
MDC IDC SESS DTM: 20190822162504
MDC IDC SET LEADCHNL RA PACING AMPLITUDE: 1.5 V
MDC IDC SET LEADCHNL RV PACING PULSEWIDTH: 0.8 ms
MDC IDC SET LEADCHNL RV SENSING SENSITIVITY: 2 mV
Pulse Gen Model: 2210

## 2018-02-03 LAB — CUP PACEART REMOTE DEVICE CHECK
Battery Remaining Longevity: 25 mo
Battery Remaining Percentage: 29 %
Battery Voltage: 2.81 V
Brady Statistic AP VP Percent: 20 %
Brady Statistic AP VS Percent: 79 %
Brady Statistic AS VS Percent: 1 %
Brady Statistic RA Percent Paced: 86 %
Date Time Interrogation Session: 20190805060325
Implantable Lead Implant Date: 20120702
Implantable Lead Implant Date: 20120702
Implantable Lead Location: 753859
Implantable Pulse Generator Implant Date: 20120702
Lead Channel Impedance Value: 250 Ohm
Lead Channel Impedance Value: 350 Ohm
Lead Channel Pacing Threshold Amplitude: 0.625 V
Lead Channel Pacing Threshold Amplitude: 1.25 V
Lead Channel Pacing Threshold Pulse Width: 0.4 ms
Lead Channel Pacing Threshold Pulse Width: 0.8 ms
Lead Channel Sensing Intrinsic Amplitude: 2.5 mV
Lead Channel Setting Pacing Amplitude: 2.5 V
Lead Channel Setting Sensing Sensitivity: 2 mV
MDC IDC LEAD LOCATION: 753860
MDC IDC MSMT LEADCHNL RV SENSING INTR AMPL: 4.8 mV
MDC IDC PG SERIAL: 7255453
MDC IDC SET LEADCHNL RA PACING AMPLITUDE: 1.625
MDC IDC SET LEADCHNL RV PACING PULSEWIDTH: 0.8 ms
MDC IDC STAT BRADY AS VP PERCENT: 1 %
MDC IDC STAT BRADY RV PERCENT PACED: 31 %

## 2018-02-09 DIAGNOSIS — Z23 Encounter for immunization: Secondary | ICD-10-CM | POA: Diagnosis not present

## 2018-03-20 DIAGNOSIS — N390 Urinary tract infection, site not specified: Secondary | ICD-10-CM | POA: Diagnosis not present

## 2018-03-20 DIAGNOSIS — J069 Acute upper respiratory infection, unspecified: Secondary | ICD-10-CM | POA: Diagnosis not present

## 2018-04-06 ENCOUNTER — Ambulatory Visit (INDEPENDENT_AMBULATORY_CARE_PROVIDER_SITE_OTHER): Payer: Medicare Other | Admitting: *Deleted

## 2018-04-06 DIAGNOSIS — I5032 Chronic diastolic (congestive) heart failure: Secondary | ICD-10-CM

## 2018-04-06 DIAGNOSIS — I480 Paroxysmal atrial fibrillation: Secondary | ICD-10-CM

## 2018-04-06 NOTE — Progress Notes (Signed)
Remote pacemaker transmission.   

## 2018-04-09 ENCOUNTER — Encounter: Payer: Self-pay | Admitting: Cardiology

## 2018-04-19 ENCOUNTER — Encounter (HOSPITAL_BASED_OUTPATIENT_CLINIC_OR_DEPARTMENT_OTHER): Payer: Self-pay | Admitting: Emergency Medicine

## 2018-04-19 ENCOUNTER — Other Ambulatory Visit: Payer: Self-pay

## 2018-04-19 ENCOUNTER — Emergency Department (HOSPITAL_BASED_OUTPATIENT_CLINIC_OR_DEPARTMENT_OTHER): Payer: Medicare Other

## 2018-04-19 ENCOUNTER — Inpatient Hospital Stay (HOSPITAL_BASED_OUTPATIENT_CLINIC_OR_DEPARTMENT_OTHER)
Admission: EM | Admit: 2018-04-19 | Discharge: 2018-04-25 | DRG: 286 | Disposition: A | Discharge status: Home Health | Payer: Medicare Other | Attending: Internal Medicine | Admitting: Internal Medicine

## 2018-04-19 DIAGNOSIS — Z7984 Long term (current) use of oral hypoglycemic drugs: Secondary | ICD-10-CM

## 2018-04-19 DIAGNOSIS — I441 Atrioventricular block, second degree: Secondary | ICD-10-CM | POA: Diagnosis present

## 2018-04-19 DIAGNOSIS — R0602 Shortness of breath: Secondary | ICD-10-CM | POA: Diagnosis not present

## 2018-04-19 DIAGNOSIS — R0902 Hypoxemia: Secondary | ICD-10-CM

## 2018-04-19 DIAGNOSIS — I8393 Asymptomatic varicose veins of bilateral lower extremities: Secondary | ICD-10-CM | POA: Diagnosis present

## 2018-04-19 DIAGNOSIS — Z950 Presence of cardiac pacemaker: Secondary | ICD-10-CM

## 2018-04-19 DIAGNOSIS — I2721 Secondary pulmonary arterial hypertension: Secondary | ICD-10-CM | POA: Diagnosis present

## 2018-04-19 DIAGNOSIS — E119 Type 2 diabetes mellitus without complications: Secondary | ICD-10-CM | POA: Diagnosis present

## 2018-04-19 DIAGNOSIS — I2510 Atherosclerotic heart disease of native coronary artery without angina pectoris: Secondary | ICD-10-CM | POA: Diagnosis present

## 2018-04-19 DIAGNOSIS — J90 Pleural effusion, not elsewhere classified: Secondary | ICD-10-CM | POA: Diagnosis not present

## 2018-04-19 DIAGNOSIS — K219 Gastro-esophageal reflux disease without esophagitis: Secondary | ICD-10-CM | POA: Diagnosis present

## 2018-04-19 DIAGNOSIS — I509 Heart failure, unspecified: Secondary | ICD-10-CM

## 2018-04-19 DIAGNOSIS — I110 Hypertensive heart disease with heart failure: Principal | ICD-10-CM | POA: Diagnosis present

## 2018-04-19 DIAGNOSIS — I272 Pulmonary hypertension, unspecified: Secondary | ICD-10-CM | POA: Diagnosis not present

## 2018-04-19 DIAGNOSIS — D509 Iron deficiency anemia, unspecified: Secondary | ICD-10-CM | POA: Diagnosis present

## 2018-04-19 DIAGNOSIS — I700 Atherosclerosis of aorta: Secondary | ICD-10-CM | POA: Diagnosis present

## 2018-04-19 DIAGNOSIS — E0590 Thyrotoxicosis, unspecified without thyrotoxic crisis or storm: Secondary | ICD-10-CM | POA: Diagnosis not present

## 2018-04-19 DIAGNOSIS — I10 Essential (primary) hypertension: Secondary | ICD-10-CM | POA: Diagnosis not present

## 2018-04-19 DIAGNOSIS — K766 Portal hypertension: Secondary | ICD-10-CM | POA: Diagnosis not present

## 2018-04-19 DIAGNOSIS — Z888 Allergy status to other drugs, medicaments and biological substances status: Secondary | ICD-10-CM

## 2018-04-19 DIAGNOSIS — I5033 Acute on chronic diastolic (congestive) heart failure: Secondary | ICD-10-CM | POA: Diagnosis present

## 2018-04-19 DIAGNOSIS — J9601 Acute respiratory failure with hypoxia: Secondary | ICD-10-CM | POA: Diagnosis present

## 2018-04-19 DIAGNOSIS — J181 Lobar pneumonia, unspecified organism: Secondary | ICD-10-CM | POA: Diagnosis not present

## 2018-04-19 DIAGNOSIS — I25119 Atherosclerotic heart disease of native coronary artery with unspecified angina pectoris: Secondary | ICD-10-CM | POA: Diagnosis present

## 2018-04-19 DIAGNOSIS — Z7901 Long term (current) use of anticoagulants: Secondary | ICD-10-CM | POA: Diagnosis not present

## 2018-04-19 DIAGNOSIS — I480 Paroxysmal atrial fibrillation: Secondary | ICD-10-CM | POA: Diagnosis not present

## 2018-04-19 DIAGNOSIS — Z885 Allergy status to narcotic agent status: Secondary | ICD-10-CM | POA: Diagnosis not present

## 2018-04-19 DIAGNOSIS — I482 Chronic atrial fibrillation, unspecified: Secondary | ICD-10-CM | POA: Diagnosis present

## 2018-04-19 DIAGNOSIS — E0520 Thyrotoxicosis with toxic multinodular goiter without thyrotoxic crisis or storm: Secondary | ICD-10-CM | POA: Diagnosis present

## 2018-04-19 DIAGNOSIS — I495 Sick sinus syndrome: Secondary | ICD-10-CM

## 2018-04-19 DIAGNOSIS — Z79899 Other long term (current) drug therapy: Secondary | ICD-10-CM | POA: Diagnosis not present

## 2018-04-19 DIAGNOSIS — I4891 Unspecified atrial fibrillation: Secondary | ICD-10-CM | POA: Diagnosis present

## 2018-04-19 DIAGNOSIS — I361 Nonrheumatic tricuspid (valve) insufficiency: Secondary | ICD-10-CM | POA: Diagnosis not present

## 2018-04-19 DIAGNOSIS — R0600 Dyspnea, unspecified: Secondary | ICD-10-CM | POA: Diagnosis present

## 2018-04-19 LAB — COMPREHENSIVE METABOLIC PANEL
ALT: 22 U/L (ref 0–44)
ANION GAP: 11 (ref 5–15)
AST: 22 U/L (ref 15–41)
Albumin: 3.9 g/dL (ref 3.5–5.0)
Alkaline Phosphatase: 81 U/L (ref 38–126)
BILIRUBIN TOTAL: 1.1 mg/dL (ref 0.3–1.2)
BUN: 10 mg/dL (ref 8–23)
CO2: 24 mmol/L (ref 22–32)
Calcium: 8.9 mg/dL (ref 8.9–10.3)
Chloride: 102 mmol/L (ref 98–111)
Creatinine, Ser: 0.6 mg/dL (ref 0.44–1.00)
GFR calc Af Amer: >60 mL/min (ref >60)
Glucose, Bld: 128 mg/dL — ABNORMAL HIGH (ref 70–99)
POTASSIUM: 3.3 mmol/L — AB (ref 3.5–5.1)
Sodium: 137 mmol/L (ref 135–145)
Total Protein: 6.7 g/dL (ref 6.5–8.1)

## 2018-04-19 LAB — CBC WITH DIFFERENTIAL/PLATELET
ABS IMMATURE GRANULOCYTES: 0.02 10*3/uL (ref 0.00–0.07)
BASOS PCT: 0 %
Basophils Absolute: 0 10*3/uL (ref 0.0–0.1)
Eosinophils Absolute: 0.1 10*3/uL (ref 0.0–0.5)
Eosinophils Relative: 1 %
HCT: 35.1 % — ABNORMAL LOW (ref 36.0–46.0)
Hemoglobin: 10.1 g/dL — ABNORMAL LOW (ref 12.0–15.0)
IMMATURE GRANULOCYTES: 0 %
Lymphocytes Relative: 12 %
Lymphs Abs: 0.8 10*3/uL (ref 0.7–4.0)
MCH: 23 pg — ABNORMAL LOW (ref 26.0–34.0)
MCHC: 28.8 g/dL — ABNORMAL LOW (ref 30.0–36.0)
MCV: 80 fL (ref 80.0–100.0)
Monocytes Absolute: 0.5 10*3/uL (ref 0.1–1.0)
Monocytes Relative: 7 %
NEUTROS ABS: 5.7 10*3/uL (ref 1.7–7.7)
NEUTROS PCT: 80 %
PLATELETS: 286 10*3/uL (ref 150–400)
RBC: 4.39 MIL/uL (ref 3.87–5.11)
RDW: 16.2 % — ABNORMAL HIGH (ref 11.5–15.5)
WBC: 7.2 10*3/uL (ref 4.0–10.5)
nRBC: 0 % (ref 0.0–0.2)

## 2018-04-19 LAB — BRAIN NATRIURETIC PEPTIDE: B NATRIURETIC PEPTIDE 5: 393.4 pg/mL — AB (ref 0.0–100.0)

## 2018-04-19 LAB — URINALYSIS, ROUTINE W REFLEX MICROSCOPIC
Bilirubin Urine: NEGATIVE
GLUCOSE, UA: NEGATIVE mg/dL
HGB URINE DIPSTICK: NEGATIVE
KETONES UR: NEGATIVE mg/dL
LEUKOCYTES UA: NEGATIVE
NITRITE: NEGATIVE
PROTEIN: NEGATIVE mg/dL
Specific Gravity, Urine: 1.01 (ref 1.005–1.030)
pH: 6.5 (ref 5.0–8.0)

## 2018-04-19 LAB — TROPONIN I
Troponin I: <0.03 ng/mL (ref <0.03)
Troponin I: <0.03 ng/mL (ref <0.03)

## 2018-04-19 LAB — GLUCOSE, CAPILLARY
GLUCOSE-CAPILLARY: 135 mg/dL — AB (ref 70–99)
Glucose-Capillary: 96 mg/dL (ref 70–99)

## 2018-04-19 MED ORDER — METOPROLOL TARTRATE 50 MG PO TABS
50.0000 mg | ORAL_TABLET | Freq: Two times a day (BID) | ORAL | Status: DC
  Administered 2018-04-19 – 2018-04-25 (×12): 50 mg via ORAL
  Filled 2018-04-19 (×12): qty 1

## 2018-04-19 MED ORDER — SODIUM CHLORIDE 0.9% FLUSH: 3.0000 mL | INTRAVENOUS | Status: DC | PRN

## 2018-04-19 MED ORDER — FUROSEMIDE 10 MG/ML IJ SOLN
40.0000 mg | Freq: Once | INTRAMUSCULAR | Status: AC
  Administered 2018-04-19: 40 mg via INTRAVENOUS
  Filled 2018-04-19: qty 4

## 2018-04-19 MED ORDER — DILTIAZEM HCL ER COATED BEADS 180 MG PO CP24
180.0000 mg | ORAL_CAPSULE | Freq: Every day | ORAL | Status: DC
  Administered 2018-04-19 – 2018-04-25 (×7): 180 mg via ORAL
  Filled 2018-04-19 (×7): qty 1

## 2018-04-19 MED ORDER — LOPERAMIDE HCL 2 MG PO CAPS
2.0000 mg | ORAL_CAPSULE | ORAL | Status: DC | PRN
  Administered 2018-04-20: 2 mg via ORAL
  Filled 2018-04-19: qty 1

## 2018-04-19 MED ORDER — PROPYLTHIOURACIL 50 MG PO TABS
100.0000 mg | ORAL_TABLET | Freq: Every morning | ORAL | Status: DC
  Administered 2018-04-20 – 2018-04-25 (×6): 100 mg via ORAL
  Filled 2018-04-19 (×7): qty 2

## 2018-04-19 MED ORDER — INSULIN ASPART 100 UNIT/ML ~~LOC~~ SOLN: 0.0000 [IU] | Freq: Three times a day (TID) | SUBCUTANEOUS | Status: DC

## 2018-04-19 MED ORDER — ASPIRIN 325 MG PO TABS
325.0000 mg | ORAL_TABLET | Freq: Once | ORAL | Status: AC
  Administered 2018-04-19: 325 mg via ORAL
  Filled 2018-04-19: qty 1

## 2018-04-19 MED ORDER — LOSARTAN POTASSIUM 25 MG PO TABS
25.0000 mg | ORAL_TABLET | Freq: Every day | ORAL | Status: DC
Start: 2018-04-19 — End: 2018-04-22
  Administered 2018-04-19 – 2018-04-21 (×3): 25 mg via ORAL
  Filled 2018-04-19 (×4): qty 1

## 2018-04-19 MED ORDER — ACETAMINOPHEN 325 MG PO TABS: 650.0000 mg | ORAL_TABLET | ORAL | Status: DC | PRN

## 2018-04-19 MED ORDER — ONDANSETRON HCL 4 MG/2ML IJ SOLN
4.0000 mg | Freq: Once | INTRAMUSCULAR | Status: AC
  Administered 2018-04-19: 4 mg via INTRAVENOUS
  Filled 2018-04-19: qty 2

## 2018-04-19 MED ORDER — ONDANSETRON HCL 4 MG/2ML IJ SOLN
4.0000 mg | Freq: Four times a day (QID) | INTRAMUSCULAR | Status: DC | PRN
  Administered 2018-04-19: 4 mg via INTRAVENOUS
  Filled 2018-04-19: qty 2

## 2018-04-19 MED ORDER — FUROSEMIDE 10 MG/ML IJ SOLN
40.0000 mg | Freq: Every day | INTRAMUSCULAR | Status: DC
  Administered 2018-04-19 – 2018-04-20 (×2): 40 mg via INTRAVENOUS
  Filled 2018-04-19 (×2): qty 4

## 2018-04-19 MED ORDER — PROPYLTHIOURACIL 50 MG PO TABS
50.0000 mg | ORAL_TABLET | Freq: Every day | ORAL | Status: DC
  Administered 2018-04-19 – 2018-04-24 (×6): 50 mg via ORAL
  Filled 2018-04-19 (×6): qty 1

## 2018-04-19 MED ORDER — DABIGATRAN ETEXILATE MESYLATE 150 MG PO CAPS
150.0000 mg | ORAL_CAPSULE | Freq: Two times a day (BID) | ORAL | Status: DC
  Administered 2018-04-19 – 2018-04-25 (×12): 150 mg via ORAL
  Filled 2018-04-19 (×12): qty 1

## 2018-04-19 MED ORDER — OXYBUTYNIN CHLORIDE 5 MG PO TABS
5.0000 mg | ORAL_TABLET | Freq: Every day | ORAL | Status: DC
  Administered 2018-04-19 – 2018-04-25 (×7): 5 mg via ORAL
  Filled 2018-04-19 (×7): qty 1

## 2018-04-19 MED ORDER — PANTOPRAZOLE SODIUM 40 MG PO TBEC
40.0000 mg | DELAYED_RELEASE_TABLET | Freq: Every day | ORAL | Status: DC
  Administered 2018-04-19 – 2018-04-25 (×7): 40 mg via ORAL
  Filled 2018-04-19 (×7): qty 1

## 2018-04-19 MED ORDER — POTASSIUM CHLORIDE CRYS ER 10 MEQ PO TBCR
10.0000 meq | EXTENDED_RELEASE_TABLET | Freq: Two times a day (BID) | ORAL | Status: DC
  Administered 2018-04-19: 10 meq via ORAL
  Filled 2018-04-19: qty 1

## 2018-04-19 MED ORDER — SODIUM CHLORIDE 0.9 % IV SOLN: 250.0000 mL | INTRAVENOUS | Status: DC | PRN

## 2018-04-19 MED ORDER — SODIUM CHLORIDE 0.9% FLUSH
3.0000 mL | Freq: Two times a day (BID) | INTRAVENOUS | Status: DC
  Administered 2018-04-19 – 2018-04-25 (×12): 3 mL via INTRAVENOUS

## 2018-04-19 NOTE — ED Notes (Signed)
Pt reports CP now; EKG repeated

## 2018-04-19 NOTE — ED Notes (Signed)
Patient transported to X-ray 

## 2018-04-19 NOTE — ED Notes (Signed)
ED TO INPATIENT HANDOFF REPORT  Name/Age/Gender Hailey [name] 72 y.o. female  Code Status Code Status History    Date Active Date Inactive Code Status Order ID Comments User Context   07/30/2017 1331 07/30/2017 1915 Full Code [id number]  Troy Sine, MD Inpatient   08/10/2013 1250 08/11/2013 1402 Full Code [id number]  Thompson Grayer, MD Inpatient   05/26/2013 1558 05/29/2013 1821 Full Code [id number]  Louellen Molder, MD Inpatient   12/23/2012 1215 12/24/2012 1807 Full Code [id number]  Hosie Spangle, MD Inpatient   07/31/2012 1811 08/01/2012 1227 Full Code [id number]  Thompson Grayer, MD Inpatient    Advance Directive Documentation     Most Recent Value  Type of Advance Directive  Healthcare Power of Attorney, Living will  Pre-existing out of facility DNR order (yellow form or pink MOST form)  -  "MOST" Form in Place?  -      Home/SNF/Other Home  Chief Complaint Breathing Difficulty, Nausea  Level of Care/Admitting Diagnosis ED Disposition    ED Disposition Condition [place]: [facility] [[id number]]  Level of Care: Telemetry [5]  Diagnosis: CHF (congestive heart failure) [facility]) [[id number]]  Admitting Physician: Tawni Millers [[id number]]  Attending Physician: Tawni Millers [[id number]]  Estimated length of stay: 3 - 4 days  Certification:: I certify this patient will need inpatient services for at least 2 midnights  PT Class (Do Not Modify): Inpatient [101]  PT Acc Code (Do Not Modify): Private [1]       Medical History Past Medical History:  Diagnosis Date  . Acute on chronic diastolic CHF (congestive heart failure), NYHA class 1 ([place]) 04/29/2013  . Allergic rhinitis   . Brady-tachy syndrome (HCC)    s/p STJ dual chamber PPM by Dr Sallyanne Kuster  . CAD (coronary artery disease)    70% RCA stenosis, treated medically  . Diabetes mellitus ([place])   . DJD (degenerative joint disease)   . Dysrhythmia    Paroxysmal Atrial Fib.  .  Hypertension   . Hyperthyroidism   . Paroxysmal atrial fibrillation (HCC)   . Polio    as a child  . PONV (postoperative nausea and vomiting)   . Presence of permanent cardiac pacemaker     Allergies Allergies  Allergen Reactions  . Codeine Nausea And Vomiting and Nausea Only  . Ketorolac Nausea And Vomiting, Nausea Only and Other (See Comments)    "pt told to never take this med"  . Meperidine Nausea Only  . Demerol Nausea And Vomiting  . Dronedarone Other (See Comments)    Increased fluid retention  . Cyclobenzaprine Nausea And Vomiting    IV Location/Drains/Wounds Patient Lines/Drains/Airways Status   Active Line/Drains/Airways    Name:   Placement date:   Placement time:   Site:   Days:   Peripheral IV 04/19/18 Right Forearm   04/19/18    1139    Forearm   less than 1   External Urinary Catheter   04/19/18    1436    -   less than 1          Labs/Imaging Results for orders placed or performed during the hospital encounter of 04/19/18 (from the past 48 hour(s))  Comprehensive metabolic panel     Status: Abnormal   Collection Time: 04/19/18 11:40 AM  Result Value Ref Range   Sodium 137 135 - 145 mmol/L   Potassium 3.3 (L) 3.5 - 5.1 mmol/L   Chloride 102 98 -  111 mmol/L   CO2 24 22 - 32 mmol/L   Glucose, Bld 128 (H) 70 - 99 mg/dL   BUN 10 8 - 23 mg/dL   Creatinine, Ser 0.60 0.44 - 1.00 mg/dL   Calcium 8.9 8.9 - 10.3 mg/dL   Total Protein 6.7 6.5 - 8.1 g/dL   Albumin 3.9 3.5 - 5.0 g/dL   AST 22 15 - 41 U/L   ALT 22 0 - 44 U/L   Alkaline Phosphatase 81 38 - 126 U/L   Total Bilirubin 1.1 0.3 - 1.2 mg/dL   GFR calc non Af Amer >60 >60 mL/min   GFR calc Af Amer >60 >60 mL/min    Comment: (NOTE) The eGFR has been calculated using the CKD EPI equation. This calculation has not been validated in all clinical situations. eGFR's persistently <60 mL/min signify possible Chronic Kidney Disease.    Anion gap 11 5 - 15    Comment: Performed at [facility],  [address] [place], [place] [address]  CBC with Differential     Status: Abnormal   Collection Time: 04/19/18 11:40 AM  Result Value Ref Range   WBC 7.2 4.0 - 10.5 K/uL   RBC 4.39 3.87 - 5.11 MIL/uL   Hemoglobin 10.1 (L) 12.0 - 15.0 g/dL   HCT 35.1 (L) 36.0 - 46.0 %   MCV 80.0 80.0 - 100.0 fL   MCH 23.0 (L) 26.0 - 34.0 pg   MCHC 28.8 (L) 30.0 - 36.0 g/dL   RDW 16.2 (H) 11.5 - 15.5 %   Platelets 286 150 - 400 K/uL   nRBC 0.0 0.0 - 0.2 %   Neutrophils Relative % 80 %   Neutro Abs 5.7 1.7 - 7.7 K/uL   Lymphocytes Relative 12 %   Lymphs Abs 0.8 0.7 - 4.0 K/uL   Monocytes Relative 7 %   Monocytes Absolute 0.5 0.1 - 1.0 K/uL   Eosinophils Relative 1 %   Eosinophils Absolute 0.1 0.0 - 0.5 K/uL   Basophils Relative 0 %   Basophils Absolute 0.0 0.0 - 0.1 K/uL   Immature Granulocytes 0 %   Abs Immature Granulocytes 0.02 0.00 - 0.07 K/uL    Comment: Performed at [facility], [address] [place], [place] [address]  Brain natriuretic peptide     Status: Abnormal   Collection Time: 04/19/18 11:40 AM  Result Value Ref Range   B Natriuretic Peptide 393.4 (H) 0.0 - 100.0 pg/mL    Comment: Performed at [facility], [address] [place], [place] [address]  Troponin I - Once     Status: None   Collection Time: 04/19/18 11:40 AM  Result Value Ref Range   Troponin I <0.03 <0.03 ng/mL    Comment: Performed at [facility], [address] [place], [place] [address]  Urinalysis, Routine w reflex microscopic     Status: None   Collection Time: 04/19/18 12:14 PM  Result Value Ref Range   Color, Urine YELLOW YELLOW   APPearance CLEAR CLEAR   Specific Gravity, Urine 1.010 1.005 - 1.030   pH 6.5 5.0 - 8.0   Glucose, UA NEGATIVE NEGATIVE mg/dL   Hgb urine dipstick NEGATIVE NEGATIVE   Bilirubin Urine NEGATIVE NEGATIVE   Ketones, ur NEGATIVE NEGATIVE mg/dL   Protein, ur NEGATIVE NEGATIVE mg/dL   Nitrite NEGATIVE NEGATIVE   Leukocytes, UA NEGATIVE  NEGATIVE    Comment: Microscopic not done on urines with  negative protein, blood, leukocytes, nitrite, or glucose < 500 mg/dL. Performed at [facility], [address] [place], [place] [address]    Dg Chest 2 View  Result Date: 04/19/2018 CLINICAL DATA:  Nausea and shortness of breath for several hours EXAM: CHEST - 2 VIEW COMPARISON:  09/11/2017 FINDINGS: Cardiac shadow is enlarged but stable. Defibrillator is again seen. Patchy infiltrate is noted in the medial right lung base with chronic blunting of the costophrenic angle. Stable scarring in the left base is seen as well. No other focal abnormality is noted. IMPRESSION: Chronic changes in the bases bilaterally with some new increased right medial infiltrate. Electronically Signed   By: Inez Catalina M.D.   On: 04/19/2018 11:43   EKG Interpretation  Date/Time:  Sunday April 19 2018 11:03:59 EST Ventricular Rate:  64 PR Interval:    QRS Duration: 119 QT Interval:  376 QTC Calculation: 388 R Axis:   78 Text Interpretation:  Sinus rhythm Probable left atrial enlargement Nonspecific intraventricular conduction delay Nonspecific T abnormalities, diffuse leads no sig change from previous. noSTEMI Confirmed by Charlesetta Shanks [phone]) on 04/19/2018 2:30:49 PM   Pending Labs Unresulted Labs (From admission, onward)   None      Vitals/Pain Today's Vitals   04/19/18 1300 04/19/18 1330 04/19/18 1400 04/19/18 1500  BP: (!) 147/72 (!) 157/71 (!) 164/68 (!) 152/90  Pulse: 64 65 73 69  Resp: 18 (!) 24 (!) 26 18  Temp:      TempSrc:      SpO2: 94% 91% 91% 94%  Weight:      Height:      PainSc:        Isolation Precautions No active isolations  Medications Medications  furosemide (LASIX) injection 40 mg (40 mg Intravenous Given 04/19/18 1433)  ondansetron (ZOFRAN) injection 4 mg (4 mg Intravenous Given 04/19/18 1454)    Mobility walks with person assist

## 2018-04-19 NOTE — Progress Notes (Signed)
72 year old female who presented with dyspnea to the [facility] at [facility].  Clinically hypervolemic and hypoxic (88% on room air).  Elevated BNP and chest film consistent with pulmonary edema.  Patient has received IV furosemide and she has been placed on supplemental oxygen per nasal cannula.  Patient will be admitted to the hospital with the working diagnosis of acute decompensated heart failure with preserved LV systolic function (diastolic heart failure).

## 2018-04-19 NOTE — ED Triage Notes (Signed)
Pt reports woke up about 2:30am with shortness of breath and nausea.

## 2018-04-19 NOTE — ED Notes (Signed)
EKG to provider

## 2018-04-19 NOTE — ED Provider Notes (Signed)
[facility] EMERGENCY DEPARTMENT Provider Note   CSN: [id number] Arrival date & time: 04/19/18  1048     History   Chief Complaint Chief Complaint  Patient presents with  . Shortness of Breath  . Nausea    HPI Hailey [name] is a 72 y.o. female.  HPI Patient has history of atrial fibrillation with pacemaker.  She is seen by cardiology.  She reports that yesterday she started getting short of breath in the evening.  She reports at 2:30 AM she felt quite short of breath acutely.  She reports every time she tried to get up to do anything she felt too short of breath that do normal activities.  Patient reports at baseline she sleeps propped up on pillows.  She denies she developed any chest pain.  She did not perceive her chest to be tight.  She reports she did feel somewhat nauseated.  Patient denies that she has had similar episodes of shortness of breath.  She reports maybe she has had some congestive heart failure, but she does not think it has been very bad if she has.  She has been compliant with medications but did not take her medications this morning.  Patient reports that she is not noted to have significant swelling in her legs recently. Past Medical History:  Diagnosis Date  . Acute on chronic diastolic CHF (congestive heart failure), NYHA class 1 ([place]) 04/29/2013  . Allergic rhinitis   . Brady-tachy syndrome (HCC)    s/p STJ dual chamber PPM by Dr Sallyanne Kuster  . CAD (coronary artery disease)    70% RCA stenosis, treated medically  . Diabetes mellitus ([place])   . DJD (degenerative joint disease)   . Dysrhythmia    Paroxysmal Atrial Fib.  . Hypertension   . Hyperthyroidism   . Paroxysmal atrial fibrillation (HCC)   . Polio    as a child  . PONV (postoperative nausea and vomiting)   . Presence of permanent cardiac pacemaker     Patient Active Problem List   Diagnosis Date Noted  . CHF (congestive heart failure) ([place]) 04/19/2018  . Dyspnea 09/08/2017  .  Hyperthyroidism 09/08/2017  . Essential hypertension 09/08/2017  . Abnormal cardiovascular stress test   . Pacemaker lead failure, initial encounter 05/26/2017  . Long term current use of anticoagulant 10/11/2015  . Second degree AV block 10/10/2015  . CAD (coronary artery disease) 08/28/2013  . A-fib ([place]) 08/10/2013  . Influenza A 05/27/2013  . Nausea, vomiting, and diarrhea 05/27/2013  . Diastolic CHF ([place]) 08/65/7846  . Community acquired pneumonia 05/26/2013  . Flu-like symptoms 05/26/2013  . Community acquired bacterial pneumonia 05/26/2013  . Acute on chronic diastolic CHF (congestive heart failure), NYHA class 1 ([place]) 04/29/2013  . Bilateral leg edema 03/25/2013  . Pacemaker [employer] Jude accent DR RF , July 2012 03/25/2013  . Atrial fibrillation ([place]) 03/02/2012  . Symptomatic sinus bradycardia 03/02/2012  . Pulmonary nodule 02/18/2011  . Mediastinal lymphadenopathy 02/18/2011    Past Surgical History:  Procedure Laterality Date  . arm surgery d/t fx Right 96  . ATRIAL FIBRILLATION ABLATION  07/31/2012; 08-10-13   PVI by Dr Rayann Heman  . ATRIAL FIBRILLATION ABLATION N/A 07/31/2012   Procedure: ATRIAL FIBRILLATION ABLATION;  Surgeon: Thompson Grayer, MD;  Location: [facility] CATH LAB;  Service: Cardiovascular;  Laterality: N/A;  . ATRIAL FIBRILLATION ABLATION N/A 08/10/2013   Procedure: ATRIAL FIBRILLATION ABLATION;  Surgeon: Coralyn Mark, MD;  Location: [facility] CATH LAB;  Service: Cardiovascular;  Laterality:  N/A;  . CARDIAC CATHETERIZATION  12/03/2010   single vessel mid RCA  . CHOLECYSTECTOMY  1994  . COLONOSCOPY WITH PROPOFOL N/A 03/21/2015   Procedure: COLONOSCOPY WITH PROPOFOL;  Surgeon: Juanita Craver, MD;  Location: [facility] ENDOSCOPY;  Service: Endoscopy;  Laterality: N/A;  . LEFT HEART CATH AND CORONARY ANGIOGRAPHY N/A 07/30/2017   Procedure: LEFT HEART CATH AND CORONARY ANGIOGRAPHY;  Surgeon: Troy Sine, MD;  Location: [facility] CV LAB;  Service: Cardiovascular;  Laterality: N/A;  . LUMBAR  LAMINECTOMY/DECOMPRESSION MICRODISCECTOMY Right 12/23/2012   Procedure: Right L5-S1 Laminotomy for resection of synovial cyst;  Surgeon: Hosie Spangle, MD;  Location: [facility] NEURO ORS;  Service: Neurosurgery;  Laterality: Right;  Right Lumbar five-sacral one Laminotomy for resection of synovial cyst  . NM MYOCAR PERF WALL MOTION  12/20/2010   normal  . PACEMAKER INSERTION  12/03/10   SJM Accent DR RF implanted by Dr Sallyanne Kuster  . TEE WITHOUT CARDIOVERSION N/A 07/30/2012   Procedure: TRANSESOPHAGEAL ECHOCARDIOGRAM (TEE);  Surgeon: Sanda Klein, MD;  Location: [facility] ENDOSCOPY;  Service: Cardiovascular;  Laterality: N/A;  h&p in file-[place]  . TEE WITHOUT CARDIOVERSION N/A 08/09/2013   Procedure: TRANSESOPHAGEAL ECHOCARDIOGRAM (TEE);  Surgeon: Lelon Perla, MD;  Location: [facility] ENDOSCOPY;  Service: Cardiovascular;  Laterality: N/A;  . TOTAL ABDOMINAL HYSTERECTOMY  1981     OB History   None      Home Medications    Prior to Admission medications   Medication Sig Start Date End Date Taking? Authorizing Provider  Cholecalciferol (VITAMIN D3) 1000 UNITS CAPS Take 1 capsule by mouth daily.     [provider]  Cyanocobalamin (VITAMIN B12 PO) Take 1 tablet by mouth daily as needed (for energy).     [provider]  DILT-XR 180 MG 24 hr capsule TAKE 1 CAPSULE BY MOUTH ONCE DAILY 01/06/18   Croitoru, Mihai, MD  furosemide (LASIX) 40 MG tablet Take 1 tablet (40 mg total) by mouth daily. 07/24/17   Croitoru, Mihai, MD  loperamide (IMODIUM A-D) 2 MG tablet Take 2 mg by mouth as needed for diarrhea or loose stools.     [provider]  losartan (COZAAR) 25 MG tablet Take 25 mg by mouth daily.  05/22/17   [provider]  metoprolol tartrate (LOPRESSOR) 50 MG tablet Take 1 tablet (50 mg total) by mouth 2 (two) times daily. 07/24/17   Croitoru, Mihai, MD  OVER THE COUNTER MEDICATION Take 1 tablet by mouth daily. Claritin    [provider]  oxybutynin (DITROPAN) 5 MG  tablet Take 5 mg by mouth daily.  09/07/15   [provider]  pantoprazole (PROTONIX) 40 MG tablet Take 40 mg by mouth daily.    [provider]  potassium chloride SA (KLOR-CON M20) 20 MEQ tablet Take 1 tablet (20 mEq total) by mouth daily. Patient taking differently: Take 10 mEq by mouth 2 (two) times daily.  07/24/17 07/19/18  Croitoru, Mihai, MD  PRADAXA 150 MG CAPS capsule TAKE 1 CAPSULE BY MOUTH TWICE DAILY 11/18/17   Allred, Jeneen Rinks, MD  propylthiouracil (PTU) 50 MG tablet Take 50-100 mg by mouth See admin instructions. Take 100 mg by mouth in the morning and take 50 mg by mouth at bedtime    [provider]  saxagliptin HCl (ONGLYZA) 5 MG TABS tablet Take 5 mg by mouth daily.    [provider]    Family History Family History  Problem Relation Age of Onset  . Heart disease Sister   .  Breast cancer Sister   . Breast cancer Sister     Social History Social History   Tobacco Use  . Smoking status: Never Smoker  . Smokeless tobacco: Never Used  Substance Use Topics  . Alcohol use: No  . Drug use: No     Allergies   Codeine; Ketorolac; Meperidine; Demerol; Dronedarone; and Cyclobenzaprine   Review of Systems Review of Systems 10 Systems reviewed and are negative for acute change except as noted in the HPI.  Physical Exam Updated Vital Signs BP (!) 147/72   Pulse 64   Temp 98 F (36.7 C) (Oral)   Resp 18   Ht 4\' 11"  (1.499 m)   Wt 61.2 kg   SpO2 94%   BMI 27.27 kg/m   Physical Exam  Constitutional: She is oriented to person, place, and time.  Patient is alert and appropriate.  She does not have respiratory distress at rest.  HENT:  Head: Normocephalic and atraumatic.  Mouth/Throat: Oropharynx is clear and moist.  Eyes: EOM are normal.  Neck: Neck supple.  Cardiovascular:  Irregularly irregular.  No gross rub murmur gallop.  Pulmonary/Chest:  Patient does not have respiratory distress at rest.  Lungs are grossly clear.   Possible decreased breath sounds right base.  No growth rale or rhonchi.  Abdominal: Soft. She exhibits no distension. There is no tenderness. There is no guarding.  Musculoskeletal: Normal range of motion.  Patient has 1+ to 2+ pitting edema bilateral lower extremities.  Skin condition good.  No wounds no cellulitis.  Neurological: She is alert and oriented to person, place, and time. She exhibits normal muscle tone. Coordination normal.  Skin: Skin is warm and dry.  Psychiatric: She has a normal mood and affect.     ED Treatments / Results  Labs (all labs ordered are listed, but only abnormal results are displayed) Labs Reviewed  COMPREHENSIVE METABOLIC PANEL - Abnormal; Notable for the following components:      Result Value   Potassium 3.3 (*)    Glucose, Bld 128 (*)    All other components within normal limits  CBC WITH DIFFERENTIAL/PLATELET - Abnormal; Notable for the following components:   Hemoglobin 10.1 (*)    HCT 35.1 (*)    MCH 23.0 (*)    MCHC 28.8 (*)    RDW 16.2 (*)    All other components within normal limits  BRAIN NATRIURETIC PEPTIDE - Abnormal; Notable for the following components:   B Natriuretic Peptide 393.4 (*)    All other components within normal limits  TROPONIN I  URINALYSIS, ROUTINE W REFLEX MICROSCOPIC    EKG EKG Interpretation  Date/Time:  Sunday April 19 2018 11:03:59 EST Ventricular Rate:  64 PR Interval:    QRS Duration: 119 QT Interval:  376 QTC Calculation: 388 R Axis:   78 Text Interpretation:  Sinus rhythm Probable left atrial enlargement Nonspecific intraventricular conduction delay Nonspecific T abnormalities, diffuse leads no sig change from previous. noSTEMI Confirmed by Charlesetta Shanks [phone]) on 04/19/2018 2:30:49 PM   Radiology Dg Chest 2 View  Result Date: 04/19/2018 CLINICAL DATA:  Nausea and shortness of breath for several hours EXAM: CHEST - 2 VIEW COMPARISON:  09/11/2017 FINDINGS: Cardiac shadow is enlarged but  stable. Defibrillator is again seen. Patchy infiltrate is noted in the medial right lung base with chronic blunting of the costophrenic angle. Stable scarring in the left base is seen as well. No other focal abnormality is noted. IMPRESSION: Chronic changes in the bases bilaterally  with some new increased right medial infiltrate. Electronically Signed   By: Inez Catalina M.D.   On: 04/19/2018 11:43    Procedures Procedures (including critical care time) CRITICAL CARE Performed by: Charlesetta Shanks   Total critical care time: 30 minutes  Critical care time was exclusive of separately billable procedures and treating other patients.  Critical care was necessary to treat or prevent imminent or life-threatening deterioration.  Critical care was time spent personally by me on the following activities: development of treatment plan with patient and/or surrogate as well as nursing, discussions with consultants, evaluation of patient's response to treatment, examination of patient, obtaining history from patient or surrogate, ordering and performing treatments and interventions, ordering and review of laboratory studies, ordering and review of radiographic studies, pulse oximetry and re-evaluation of patient's condition. Medications Ordered in ED Medications  furosemide (LASIX) injection 40 mg (has no administration in time range)     Initial Impression / Assessment and Plan / ED Course  I have reviewed the triage vital signs and the nursing notes.  Pertinent labs & imaging results that were available during my care of the patient were reviewed by me and considered in my medical decision making (see chart for details).    Patient presents with shortness of breath and hypoxia.  Room air saturation 88%.  95% on 2 L nasal cannula.  Patient does not use home oxygen.  Consult: Reviewed with [facility] hospitalist Dr. Cathlean Sauer who accepts for admission.  Patient presents with history of atrial fibrillation  with pacer.  She became fairly acutely short of breath overnight.  Patient does not have any associated chest pain.  EKG does not show ischemic changes at this time.  First troponin is normal.  Patient does have elevation in BNP.  She does have peripheral edema.  Her daughter reports she probably has missed more Lasix doses than she should do to recent activities and the patient's reluctance to take Lasix when she is out of the house due to increased urinary frequency.  Patient is on supplemental oxygen at this time.  Lasix has been ordered.  Plan to admit to hospitalist service.  Final Clinical Impressions(s) / ED Diagnoses   Final diagnoses:  Acute on chronic congestive heart failure, unspecified heart failure type [facility])  Hypoxia    ED Discharge Orders    None       Charlesetta Shanks, MD 04/19/18 1433

## 2018-04-19 NOTE — H&P (Addendum)
History and Physical:    Hailey [name]   ZOX:[id number] DOB: [birth year] DOA: 04/19/2018  Referring MD/provider: Dr. Vallery Ridge PCP: Jani Gravel, MD   Patient coming from: Home  Chief Complaint: shortness of breath  History of Present Illness:   Hailey [name] is an 72 y.o. female with past medical history significant for hypertension, diabetes, diastolic heart failure,atrial fibrillation, sick sinus syndrome with pacemaker placement so for significant bradycardia who was in her usual state of reasonably good health until last night at 2:30 AM when she she was awoken because of shortness of breath. Patient states that she tossed and turned all night but was having a hard time getting comfortable due to shortness of breath. This morning when she woke up to go to make breakfast she said she was unable to walk without becoming very short of breath. Patient states this is very unusual for her she has never felt anything like this in the past.  Patient denies any chest discomfort and back pain or neck pain. She denies any sense of palpitations. She does admit to being tired recently because her husband has been sick so she has been coming back and forth from the hospital the past week. Patient does admit to some dietary indiscretion and that she normally cooks her own food however the past week she's been eating a lot of take-out cause of being in the hospital with her husband. She does admit to some increased lower extremity edema left greater than right. She denies missing any of her medications and states she's been taking her Lasix as ordered.  ED Course:  The patient was treated with Lasix 40 mg IV with good effect per patient. Patient states that she started to feel much better once she started "urinating all the fluid out".  ROS:   ROS   Review of Systems: General: No fever, chills, weight changes Skin: No rashes, lesions, wounds Eyes: no discharge, redness, pain HENT: no ear pain,  hearing loss, drainage, tinnitus Endocrine: no heat/cold intolerance, no polyuria Cardiovascular: No palpitations, chest pain GI: No nausea, vomiting, diarrhea, constipation GU: No dysuria, increased frequency CNS: No numbness, dizziness, headache   Past Medical History:   Past Medical History:  Diagnosis Date  . Acute on chronic diastolic CHF (congestive heart failure), NYHA class 1 ([place]) 04/29/2013  . Allergic rhinitis   . Brady-tachy syndrome (HCC)    s/p STJ dual chamber PPM by Dr Sallyanne Kuster  . CAD (coronary artery disease)    70% RCA stenosis, treated medically  . Diabetes mellitus ([place])   . DJD (degenerative joint disease)   . Dysrhythmia    Paroxysmal Atrial Fib.  . Hypertension   . Hyperthyroidism   . Paroxysmal atrial fibrillation (HCC)   . Polio    as a child  . PONV (postoperative nausea and vomiting)   . Presence of permanent cardiac pacemaker     Past Surgical History:   Past Surgical History:  Procedure Laterality Date  . arm surgery d/t fx Right 96  . ATRIAL FIBRILLATION ABLATION  07/31/2012; 08-10-13   PVI by Dr Rayann Heman  . ATRIAL FIBRILLATION ABLATION N/A 07/31/2012   Procedure: ATRIAL FIBRILLATION ABLATION;  Surgeon: Thompson Grayer, MD;  Location: [facility] CATH LAB;  Service: Cardiovascular;  Laterality: N/A;  . ATRIAL FIBRILLATION ABLATION N/A 08/10/2013   Procedure: ATRIAL FIBRILLATION ABLATION;  Surgeon: Coralyn Mark, MD;  Location: [place] CATH LAB;  Service: Cardiovascular;  Laterality: N/A;  . CARDIAC CATHETERIZATION  12/03/2010  single vessel mid RCA  . CHOLECYSTECTOMY  1994  . COLONOSCOPY WITH PROPOFOL N/A 03/21/2015   Procedure: COLONOSCOPY WITH PROPOFOL;  Surgeon: Juanita Craver, MD;  Location: [facility] ENDOSCOPY;  Service: Endoscopy;  Laterality: N/A;  . LEFT HEART CATH AND CORONARY ANGIOGRAPHY N/A 07/30/2017   Procedure: LEFT HEART CATH AND CORONARY ANGIOGRAPHY;  Surgeon: Troy Sine, MD;  Location: [place] CV LAB;  Service: Cardiovascular;  Laterality: N/A;    . LUMBAR LAMINECTOMY/DECOMPRESSION MICRODISCECTOMY Right 12/23/2012   Procedure: Right L5-S1 Laminotomy for resection of synovial cyst;  Surgeon: Hosie Spangle, MD;  Location: [place] NEURO ORS;  Service: Neurosurgery;  Laterality: Right;  Right Lumbar five-sacral one Laminotomy for resection of synovial cyst  . NM MYOCAR PERF WALL MOTION  12/20/2010   normal  . PACEMAKER INSERTION  12/03/10   SJM Accent DR RF implanted by Dr Sallyanne Kuster  . TEE WITHOUT CARDIOVERSION N/A 07/30/2012   Procedure: TRANSESOPHAGEAL ECHOCARDIOGRAM (TEE);  Surgeon: Sanda Klein, MD;  Location: [facility] ENDOSCOPY;  Service: Cardiovascular;  Laterality: N/A;  h&p in file-hope  . TEE WITHOUT CARDIOVERSION N/A 08/09/2013   Procedure: TRANSESOPHAGEAL ECHOCARDIOGRAM (TEE);  Surgeon: Lelon Perla, MD;  Location: [place];  Service: Cardiovascular;  Laterality: N/A;  . TOTAL ABDOMINAL HYSTERECTOMY  1981    Social History:   Social History   Socioeconomic History  . Marital status: Married    Spouse name: [name]  . Number of children: Not on file  . Years of education: Not on file  . Highest education level: Not on file  Occupational History  . Occupation: housewife  Social Needs  . Financial resource strain: Not on file  . Food insecurity:    Worry: Not on file    Inability: Not on file  . Transportation needs:    Medical: Not on file    Non-medical: Not on file  Tobacco Use  . Smoking status: Never Smoker  . Smokeless tobacco: Never Used  Substance and Sexual Activity  . Alcohol use: No  . Drug use: No  . Sexual activity: Not Currently    Birth control/protection: Abstinence  Lifestyle  . Physical activity:    Days per week: Not on file    Minutes per session: Not on file  . Stress: Not on file  Relationships  . Social connections:    Talks on phone: Not on file    Gets together: Not on file    Attends religious service: Not on file    Active member of club or organization: Not on file    Attends  meetings of clubs or organizations: Not on file    Relationship status: Not on file  . Intimate partner violence:    Fear of current or ex partner: Not on file    Emotionally abused: Not on file    Physically abused: Not on file    Forced sexual activity: Not on file  Other Topics Concern  . Not on file  Social History Narrative   Lives in [place].  Retired.    Allergies   Codeine; Ketorolac; Meperidine; Demerol; Dronedarone; and Cyclobenzaprine  Family history:   Family History  Problem Relation Age of Onset  . Heart disease Sister   . Breast cancer Sister   . Breast cancer Sister     Current Medications:   Prior to Admission medications   Medication Sig Start Date End Date Taking? Authorizing Provider  Cholecalciferol (VITAMIN D3) 1000 UNITS CAPS Take 1 capsule by mouth daily.  [provider]  Cyanocobalamin (VITAMIN B12 PO) Take 1 tablet by mouth daily as needed (for energy).     [provider]  DILT-XR 180 MG 24 hr capsule TAKE 1 CAPSULE BY MOUTH ONCE DAILY 01/06/18   Croitoru, Mihai, MD  furosemide (LASIX) 40 MG tablet Take 1 tablet (40 mg total) by mouth daily. 07/24/17   Croitoru, Mihai, MD  loperamide (IMODIUM A-D) 2 MG tablet Take 2 mg by mouth as needed for diarrhea or loose stools.     [provider]  losartan (COZAAR) 25 MG tablet Take 25 mg by mouth daily.  05/22/17   [provider]  metoprolol tartrate (LOPRESSOR) 50 MG tablet Take 1 tablet (50 mg total) by mouth 2 (two) times daily. 07/24/17   Croitoru, Mihai, MD  OVER THE COUNTER MEDICATION Take 1 tablet by mouth daily. Claritin    [provider]  oxybutynin (DITROPAN) 5 MG tablet Take 5 mg by mouth daily.  09/07/15   [provider]  pantoprazole (PROTONIX) 40 MG tablet Take 40 mg by mouth daily.    [provider]  potassium chloride SA (KLOR-CON M20) 20 MEQ tablet Take 1 tablet (20 mEq total) by mouth daily. Patient taking differently: Take  10 mEq by mouth 2 (two) times daily.  07/24/17 07/19/18  Croitoru, Mihai, MD  PRADAXA 150 MG CAPS capsule TAKE 1 CAPSULE BY MOUTH TWICE DAILY 11/18/17   Allred, Jeneen Rinks, MD  propylthiouracil (PTU) 50 MG tablet Take 50-100 mg by mouth See admin instructions. Take 100 mg by mouth in the morning and take 50 mg by mouth at bedtime    [provider]  saxagliptin HCl (ONGLYZA) 5 MG TABS tablet Take 5 mg by mouth daily.    [provider]    Physical Exam:   Vitals:   04/19/18 1400 04/19/18 1500 04/19/18 1709 04/19/18 1716  BP: (!) 164/68 (!) 152/90  (!) 140/59  Pulse: 73 69  62  Resp: (!) 26 18    Temp:    98 F (36.7 C)  TempSrc:    Oral  SpO2: 91% 94%  97%  Weight:   59.6 kg   Height:   4\' 11"  (1.499 m)      Physical Exam: Blood pressure (!) 140/59, pulse 62, temperature 98 F (36.7 C), temperature source Oral, resp. rate 18, height 4\' 11"  (1.499 m), weight 59.6 kg, SpO2 97 %. Gen: tired appearing female sitting up on side of bed in no acute distress. No pursed lips breathing. No tachypnea. Eyes: Sclerae anicteric. Conjunctiva mildly injected. Neck: Supple, no jugular venous distention. Chest: Moderately good air entry bilaterally. He does have a few rales at the left base. Her right lung is clear. She did have some posted tori coughing suggestive of rhonchus present however I did not hear any wheezes.  CV: Distant, regular, no audible murmurs. Abdomen: NABS, soft, nondistended, nontender. No tenderness to light or deep palpation. No rebound, no guarding. Extremities: patient has bilateral lower extremity edema left is 1-2+ in right is trace to 1+. Patient has significant varicose veins on her left lower extremity as well. Neuro: Alert and oriented times 3; grossly nonfocal. Psych: Patient is cooperative, logical and coherent with appropriate mood and affect.  Data Review:    Labs: Basic Metabolic Panel: Recent Labs  Lab 04/19/18 1140  NA 137  K 3.3*  CL 102    CO2 24  GLUCOSE 128*  BUN 10  CREATININE 0.60  CALCIUM 8.9  Liver Function Tests: Recent Labs  Lab 04/19/18 1140  AST 22  ALT 22  ALKPHOS 81  BILITOT 1.1  PROT 6.7  ALBUMIN 3.9   No results for input(s): LIPASE, AMYLASE in the last 168 hours. No results for input(s): AMMONIA in the last 168 hours. CBC: Recent Labs  Lab 04/19/18 1140  WBC 7.2  NEUTROABS 5.7  HGB 10.1*  HCT 35.1*  MCV 80.0  PLT 286   Cardiac Enzymes: Recent Labs  Lab 04/19/18 1140  TROPONINI <0.03    BNP (last 3 results) No results for input(s): PROBNP in the last 8760 hours. CBG: Recent Labs  Lab 04/19/18 1703  GLUCAP 135*    Urinalysis    Component Value Date/Time   COLORURINE YELLOW 04/19/2018 [place] 04/19/2018 1214   LABSPEC 1.010 04/19/2018 1214   PHURINE 6.5 04/19/2018 1214   GLUCOSEU NEGATIVE 04/19/2018 1214   HGBUR NEGATIVE 04/19/2018 1214   BILIRUBINUR NEGATIVE 04/19/2018 1214   KETONESUR NEGATIVE 04/19/2018 1214   PROTEINUR NEGATIVE 04/19/2018 1214   UROBILINOGEN 0.2 02/26/2017 1930   NITRITE NEGATIVE 04/19/2018 1214   LEUKOCYTESUR NEGATIVE 04/19/2018 1214      Radiographic Studies: Dg Chest 2 View  Result Date: 04/19/2018 CLINICAL DATA:  Nausea and shortness of breath for several hours EXAM: CHEST - 2 VIEW COMPARISON:  09/11/2017 FINDINGS: Cardiac shadow is enlarged but stable. Defibrillator is again seen. Patchy infiltrate is noted in the medial right lung base with chronic blunting of the costophrenic angle. Stable scarring in the left base is seen as well. No other focal abnormality is noted. IMPRESSION: Chronic changes in the bases bilaterally with some new increased right medial infiltrate. Electronically Signed   By: Inez Catalina M.D.   On: 04/19/2018 11:43    EKG: Independently reviewed.normal intervals. Normal axis. Sinus rhythm at 64 with an occasional PAC. She does have very mild T-wave inversions in II, III, and F as well as V3 to V6   Generally flattened T waves throughout.   Assessment/Plan:   Principal Problem:   Acute on chronic diastolic CHF (congestive heart failure), NYHA class 1 (HCC) Active Problems:   Atrial fibrillation (HCC)   CAD (coronary artery disease)   Second degree AV block   Dyspnea   Hyperthyroidism   Essential hypertension  ACUTE DYSPENEA With acute dyspnea at 2:30 last night which is improved with IV Lasix which is suggestive of flash pulmonary edema. Certainly people with diastolic heart failure can develop flash pulmonary edema especially given increased salt intake over the past week leading to increased intravascular volume.I'm also concerned with her T-wave inversions seen on EKG although her initial troponin is negative and she is otherwise asymptomatic. Of note patient's chest x-ray is not typical of pulmonary edema given right middle lobe infiltrate. However there is no clinical indication for pneumonia given no fevers, malaise, cough or leukocytosis. Also she's much improved with IV Lasix. Will prescribe Lasix 40 mg IV daily daily weights and ins and outs. We'll also cycle troponin every 6 hours 3. Also give patient aspirin 325 by mouth 1 now. Continue diltiazem, metoprolol and losartan per home doses Echocardiogram requested.  ATRIAL FIBRILLATION PPM provides rate control Continue Pradaxa for anticoagulation  DM Hold saxagliptin, start SSI achs  SSS S/p PPM  HTN Continue diltiazem, losartan and metoprolol per home doses  HYPERTHYROIDISM Continue PTU per home doses  GERD Continue pantoprazole   Other information:   DVT prophylaxis: Lovenox ordered. Code Status: Full code.  Family  Communication: patient states family is aware she is here.  Disposition Plan: home Consults called: none Admission status: inpatient   The medical decision making on this patient was of high complexity and the patient is at high risk for clinical deterioration, therefore this is a level  3 visit.  Dewaine Oats Tublu Hailey [name] [facility]  If 7PM-7AM, please contact night-coverage [email or web address] Password [facility] 04/19/2018, 5:41 PM

## 2018-04-19 NOTE — ED Notes (Signed)
Report to floor nurse was attempted by Maretta Bees, RN.

## 2018-04-20 ENCOUNTER — Inpatient Hospital Stay (HOSPITAL_COMMUNITY): Payer: Medicare Other

## 2018-04-20 DIAGNOSIS — R0902 Hypoxemia: Secondary | ICD-10-CM

## 2018-04-20 DIAGNOSIS — J181 Lobar pneumonia, unspecified organism: Secondary | ICD-10-CM

## 2018-04-20 DIAGNOSIS — I361 Nonrheumatic tricuspid (valve) insufficiency: Secondary | ICD-10-CM

## 2018-04-20 LAB — BASIC METABOLIC PANEL
Anion gap: 7 (ref 5–15)
BUN: 8 mg/dL (ref 8–23)
CALCIUM: 8.5 mg/dL — AB (ref 8.9–10.3)
CO2: 28 mmol/L (ref 22–32)
CREATININE: 0.9 mg/dL (ref 0.44–1.00)
Chloride: 104 mmol/L (ref 98–111)
GFR calc non Af Amer: >60 mL/min (ref >60)
Glucose, Bld: 116 mg/dL — ABNORMAL HIGH (ref 70–99)
Potassium: 3.4 mmol/L — ABNORMAL LOW (ref 3.5–5.1)
Sodium: 139 mmol/L (ref 135–145)

## 2018-04-20 LAB — ECHOCARDIOGRAM COMPLETE
HEIGHTINCHES: 59 in
Weight: 2081.6 oz

## 2018-04-20 LAB — GLUCOSE, CAPILLARY
GLUCOSE-CAPILLARY: 91 mg/dL (ref 70–99)
Glucose-Capillary: 116 mg/dL — ABNORMAL HIGH (ref 70–99)
Glucose-Capillary: 106 mg/dL — ABNORMAL HIGH (ref 70–99)
Glucose-Capillary: 94 mg/dL (ref 70–99)
Glucose-Capillary: 94 mg/dL (ref 70–99)

## 2018-04-20 LAB — TROPONIN I
Troponin I: <0.03 ng/mL (ref <0.03)
Troponin I: <0.03 ng/mL (ref <0.03)

## 2018-04-20 MED ORDER — POTASSIUM CHLORIDE CRYS ER 20 MEQ PO TBCR
40.0000 meq | EXTENDED_RELEASE_TABLET | Freq: Once | ORAL | Status: AC
  Administered 2018-04-20: 40 meq via ORAL
  Filled 2018-04-20: qty 2

## 2018-04-20 MED ORDER — LEVOFLOXACIN 500 MG PO TABS
500.0000 mg | ORAL_TABLET | Freq: Every day | ORAL | Status: DC
  Administered 2018-04-20 – 2018-04-21 (×2): 500 mg via ORAL
  Filled 2018-04-20 (×2): qty 1

## 2018-04-20 MED ORDER — FUROSEMIDE 10 MG/ML IJ SOLN
40.0000 mg | Freq: Two times a day (BID) | INTRAMUSCULAR | Status: DC
  Administered 2018-04-20 – 2018-04-22 (×4): 40 mg via INTRAVENOUS
  Filled 2018-04-20 (×4): qty 4

## 2018-04-20 NOTE — Progress Notes (Signed)
  Echocardiogram 2D Echocardiogram has been performed.  Hailey [name] 04/20/2018, 11:56 AM

## 2018-04-20 NOTE — Progress Notes (Signed)
PROGRESS NOTE  Hailey [name] OIZ:[id number] DOB: 12-05-1945 DOA: 04/19/2018 PCP: Jani Gravel, MD   LOS: 1 day   Brief Narrative / Interim history: 72 year old female with history of hypertension, diabetes, chronic diastolic CHF, A. fib/sick sinus syndrome with pacemaker, was admitted to the hospital on 11/17 with relatively acute onset of shortness of breath.  She reports a cough for the prior 2 days, weakness, and she was concerned that she would get an upper respiratory infection with sinus congestion and pressure.  Cough has been nonproductive.  Subjective: -Feels little bit better, she appreciates she is less short of breath however still dyspneic with ambulation to the bedside commode  Assessment & Plan: Principal Problem:   Acute on chronic diastolic CHF (congestive heart failure), NYHA class 1 (HCC) Active Problems:   Atrial fibrillation (HCC)   CAD (coronary artery disease)   Second degree AV block   Dyspnea   Hyperthyroidism   Essential hypertension   Acute hypoxic respiratory failure due to acute on chronic diastolic CHF/pulmonary hypertension -Patient with evidence of fluid overload with lower extremity edema, pulmonary edema on chest x-ray on admission, remains hypoxic today is with minimal activity going to the bedside commode her oxygen saturation has decreased into the upper 70s per nursing staff.  She appears tachypneic on exam and interview. -We will continue intravenous Lasix -2D echo obtained today showed normal EF, grade 2 diastolic dysfunction as well as her PA pressure has increased compared to the prior study.   Lobar pneumonia -Chest x-ray with infiltrate in the medial right lung base, she did have URI type symptoms as well as cough and may be indicative of early pneumonia.  Would favor to treat with Levaquin for a few days.  Chronic A. fib -Now rate controlled with PPM, continue Pradaxa for anticoagulation  Diabetes mellitus -Continue sliding  scale  Sick sinus syndrome -Pacemaker in place  Hypertension -Controlled, continue home medications   Scheduled Meds: . dabigatran  150 mg Oral BID  . diltiazem  180 mg Oral Daily  . furosemide  40 mg Intravenous Daily  . insulin aspart  0-9 Units Subcutaneous TID WC  . levofloxacin  500 mg Oral Daily  . losartan  25 mg Oral Daily  . metoprolol tartrate  50 mg Oral BID  . oxybutynin  5 mg Oral Daily  . pantoprazole  40 mg Oral Daily  . propylthiouracil  100 mg Oral q morning - 10a  . propylthiouracil  50 mg Oral QHS  . sodium chloride flush  3 mL Intravenous Q12H   Continuous Infusions: . sodium chloride     PRN Meds:.sodium chloride, acetaminophen, loperamide, ondansetron (ZOFRAN) IV, sodium chloride flush  DVT prophylaxis: She is on Pradaxa Code Status: Full code Family Communication: No family present at bedside Disposition Plan: Home 1 ready  Consultants:   None, discussed with her primary cardiologist Dr. Sallyanne Kuster over the phone  Procedures:   2D echo:  Study Conclusions  - Left ventricle: The cavity size was normal. Systolic function was   normal. The estimated ejection fraction was in the range of 55% to 60%. Wall motion was normal; there were no regional wall motion abnormalities. There was a reduced contribution of atrial contraction to ventricular filling, due to increased ventricular diastolic pressure or atrial contractile dysfunction. Features are consistent with a pseudonormal left ventricular filling pattern, with concomitant abnormal relaxation and increased filling pressure (grade 2 diastolic dysfunction). - Left atrium: The atrium was mildly dilated. - Right ventricle: The cavity  size was mildly dilated. Wall thickness was normal. - Right atrium: The atrium was mildly dilated. - Tricuspid valve: There was mild-moderate regurgitation directed centrally. - Pulmonary arteries: Systolic pressure was moderately to severely increased. PA peak pressure: 66  mm Hg (S).  Impressions:  - PA pressure has increased since the previous study. Recommend repeat limited echo after diuresis to reassess the PA pressure (can be performed during outpatient follow up).  Antimicrobials:  Levaquin 11/18 >>   Objective: Vitals:   04/20/18 0850 04/20/18 0858 04/20/18 0900 04/20/18 1140  BP:    (!) 111/48  Pulse: 68 82 70 76  Resp: 18 15 (!) 24 (!) 23  Temp:      TempSrc:      SpO2: (!) 86% (!) 78% 100% 93%  Weight:      Height:        Intake/Output Summary (Last 24 hours) at 04/20/2018 1356 Last data filed at 04/20/2018 1223 Gross per 24 hour  Intake 240 ml  Output 1200 ml  Net -960 ml   Filed Weights   04/19/18 1056 04/19/18 1709 04/20/18 0445  Weight: 61.2 kg 59.6 kg 59 kg    Examination:  Constitutional: Slightly tachypneic Eyes: PERRL, lids and conjunctivae normal ENMT: Mucous membranes are moist. No oropharyngeal exudates Neck: normal, supple Respiratory: Bibasilar crackles, no wheezing heard.  No significant rhonchi.  Slightly increased respiratory effort Cardiovascular: Regular rate and rhythm, no murmurs / rubs / gallops.  1+ pitting lower extremity edema Abdomen: no tenderness. Bowel sounds positive.  Musculoskeletal: no clubbing / cyanosis.  Skin: no rashes Neurologic: CN 2-12 grossly intact. Strength 5/5 in all 4.  Psychiatric: Normal judgment and insight. Alert and oriented x 3. Normal mood.    Data Reviewed: I have independently reviewed following labs and imaging studies   CXR - personally reviewed, faint right infiltrate present EKG / telemetry - paced rhythm   CBC: Recent Labs  Lab 04/19/18 1140  WBC 7.2  NEUTROABS 5.7  HGB 10.1*  HCT 35.1*  MCV 80.0  PLT 025   Basic Metabolic Panel: Recent Labs  Lab 04/19/18 1140 04/20/18 0443  NA 137 139  K 3.3* 3.4*  CL 102 104  CO2 24 28  GLUCOSE 128* 116*  BUN 10 8  CREATININE 0.60 0.90  CALCIUM 8.9 8.5*   GFR: Estimated Creatinine Clearance: 43.5  mL/min (by C-G formula based on SCr of 0.9 mg/dL). Liver Function Tests: Recent Labs  Lab 04/19/18 1140  AST 22  ALT 22  ALKPHOS 81  BILITOT 1.1  PROT 6.7  ALBUMIN 3.9   No results for input(s): LIPASE, AMYLASE in the last 168 hours. No results for input(s): AMMONIA in the last 168 hours. Coagulation Profile: No results for input(s): INR, PROTIME in the last 168 hours. Cardiac Enzymes: Recent Labs  Lab 04/19/18 1140 04/19/18 1758 04/19/18 2316 04/20/18 0443  TROPONINI <0.03 <0.03 <0.03 <0.03   BNP (last 3 results) No results for input(s): PROBNP in the last 8760 hours. HbA1C: No results for input(s): HGBA1C in the last 72 hours. CBG: Recent Labs  Lab 04/19/18 1703 04/19/18 2120 04/20/18 0748 04/20/18 1134 04/20/18 1233  GLUCAP 135* 96 116* 106* 94   Lipid Profile: No results for input(s): CHOL, HDL, LDLCALC, TRIG, CHOLHDL, LDLDIRECT in the last 72 hours. Thyroid Function Tests: No results for input(s): TSH, T4TOTAL, FREET4, T3FREE, THYROIDAB in the last 72 hours. Anemia Panel: No results for input(s): VITAMINB12, FOLATE, FERRITIN, TIBC, IRON, RETICCTPCT in the last 72 hours.  Urine analysis:    Component Value Date/Time   COLORURINE YELLOW 04/19/2018 [place] 04/19/2018 1214   LABSPEC 1.010 04/19/2018 1214   PHURINE 6.5 04/19/2018 [place] 04/19/2018 1214   HGBUR NEGATIVE 04/19/2018 1214   BILIRUBINUR NEGATIVE 04/19/2018 1214   KETONESUR NEGATIVE 04/19/2018 1214   PROTEINUR NEGATIVE 04/19/2018 1214   UROBILINOGEN 0.2 02/26/2017 1930   NITRITE NEGATIVE 04/19/2018 1214   LEUKOCYTESUR NEGATIVE 04/19/2018 1214   Sepsis Labs: Invalid input(s): PROCALCITONIN, LACTICIDVEN  No results found for this or any previous visit (from the past 240 hour(s)).    Radiology Studies: Dg Chest 2 View  Result Date: 04/19/2018 CLINICAL DATA:  Nausea and shortness of breath for several hours EXAM: CHEST - 2 VIEW COMPARISON:  09/11/2017  FINDINGS: Cardiac shadow is enlarged but stable. Defibrillator is again seen. Patchy infiltrate is noted in the medial right lung base with chronic blunting of the costophrenic angle. Stable scarring in the left base is seen as well. No other focal abnormality is noted. IMPRESSION: Chronic changes in the bases bilaterally with some new increased right medial infiltrate. Electronically Signed   By: Inez Catalina M.D.   On: 04/19/2018 11:43    Hailey Board, MD, PhD [facility] Pager [phone]  If 7PM-7AM, please contact night-coverage [email or web address] Password [email or web address] 04/20/2018, 1:56 PM

## 2018-04-20 NOTE — Discharge Instructions (Signed)

## 2018-04-21 LAB — BASIC METABOLIC PANEL
Anion gap: 8 (ref 5–15)
BUN: 8 mg/dL (ref 8–23)
CALCIUM: 8.8 mg/dL — AB (ref 8.9–10.3)
CO2: 30 mmol/L (ref 22–32)
CREATININE: 1 mg/dL (ref 0.44–1.00)
Chloride: 101 mmol/L (ref 98–111)
GFR, EST NON AFRICAN AMERICAN: 55 mL/min — AB (ref >60)
Glucose, Bld: 100 mg/dL — ABNORMAL HIGH (ref 70–99)
Potassium: 3.7 mmol/L (ref 3.5–5.1)
SODIUM: 139 mmol/L (ref 135–145)

## 2018-04-21 LAB — GLUCOSE, CAPILLARY
GLUCOSE-CAPILLARY: 94 mg/dL (ref 70–99)
GLUCOSE-CAPILLARY: 111 mg/dL — AB (ref 70–99)
Glucose-Capillary: 123 mg/dL — ABNORMAL HIGH (ref 70–99)
Glucose-Capillary: 102 mg/dL — ABNORMAL HIGH (ref 70–99)

## 2018-04-21 MED ORDER — ZOLPIDEM TARTRATE 5 MG PO TABS
5.0000 mg | ORAL_TABLET | Freq: Once | ORAL | Status: DC
  Filled 2018-04-21: qty 1

## 2018-04-21 MED ORDER — LEVOFLOXACIN 500 MG PO TABS
250.0000 mg | ORAL_TABLET | Freq: Every day | ORAL | Status: AC
  Administered 2018-04-22 – 2018-04-24 (×3): 250 mg via ORAL
  Filled 2018-04-21 (×3): qty 1

## 2018-04-21 NOTE — Progress Notes (Signed)
Attempted to wean patient to room air. Pt (at rest) is currently unable to hold SPO2 above 88 on room air. Dr Cruzita Lederer made aware. Will continue to monitor.

## 2018-04-21 NOTE — Progress Notes (Signed)
PHARMACY NOTE:  ANTIMICROBIAL RENAL DOSAGE ADJUSTMENT  Current antimicrobial regimen includes a mismatch between antimicrobial dosage and estimated renal function.  As per policy approved by the Pharmacy & Therapeutics and Medical Executive Committees, the antimicrobial dosage will be adjusted accordingly.  Current antimicrobial dosage:  Levaquin 500mg  po q24h  Indication: Pneumonia  Renal Function:  Estimated Creatinine Clearance: 39.2 mL/min (by C-G formula based on SCr of 1 mg/dL).     Antimicrobial dosage has been changed to:  levaquin 250mg  po q24hr  Additional comments:   Thank you for allowing pharmacy to be a part of this patient's care.  Hildred Laser, PharmD Clinical Pharmacist **Pharmacist phone directory can now be found on [email or web address] ([email or web address]).  Listed under [place].

## 2018-04-21 NOTE — Progress Notes (Signed)
PROGRESS NOTE  Hailey [name] [id number]:[id number] DOB: 11-Sep-1945 DOA: 04/19/2018 PCP: Jani Gravel, MD   LOS: 2 days   Brief Narrative / Interim history: 72 year old female with history of hypertension, diabetes, chronic diastolic CHF, A. fib/sick sinus syndrome with pacemaker, was admitted to the hospital on 11/17 with relatively acute onset of shortness of breath.  She reports a cough for the prior 2 days, weakness, and she was concerned that she would get an upper respiratory infection with sinus congestion and pressure.  Cough has been nonproductive.  Subjective: -Continues to feel better however quite tired and weak when tries to move around  Assessment & Plan: Principal Problem:   Acute on chronic diastolic CHF (congestive heart failure), NYHA class 1 (HCC) Active Problems:   Atrial fibrillation (HCC)   CAD (coronary artery disease)   Second degree AV block   Dyspnea   Hyperthyroidism   Essential hypertension   Acute hypoxic respiratory failure due to acute on chronic diastolic CHF/pulmonary hypertension -Patient with evidence of fluid overload with lower extremity edema, pulmonary edema on chest x-ray on admission -We will continue intravenous Lasix -2D echo obtained showed normal EF, grade 2 diastolic dysfunction as well as her PA pressure has increased compared to the prior study.  -She remains hypoxic, on my interview I took off the oxygen and she dips into the upper 80s, this is confirmed by the nursing staff later on.  Did not attempt to ambulate due to hypoxia at rest  Lobar pneumonia -Chest x-ray with infiltrate in the medial right lung base, she did have URI type symptoms as well as cough and may be indicative of early pneumonia.  Would favor to treat with Levaquin for a few days.  Today day 2/5 -Remains hypoxic  Chronic A. fib -Now rate controlled with PPM, continue Pradaxa for anticoagulation -No significant events on telemetry overnight  Diabetes mellitus -Continue  sliding scale, CBGs fairly well controlled 90s to 110s  Sick sinus syndrome -Pacemaker in place  Hypertension -Controlled, continue home medications   Scheduled Meds: . dabigatran  150 mg Oral BID  . diltiazem  180 mg Oral Daily  . furosemide  40 mg Intravenous BID  . insulin aspart  0-9 Units Subcutaneous TID WC  . [START ON 04/22/2018] levofloxacin  250 mg Oral Daily  . losartan  25 mg Oral Daily  . metoprolol tartrate  50 mg Oral BID  . oxybutynin  5 mg Oral Daily  . pantoprazole  40 mg Oral Daily  . propylthiouracil  100 mg Oral q morning - 10a  . propylthiouracil  50 mg Oral QHS  . sodium chloride flush  3 mL Intravenous Q12H   Continuous Infusions: . sodium chloride     PRN Meds:.sodium chloride, acetaminophen, loperamide, ondansetron (ZOFRAN) IV, sodium chloride flush  DVT prophylaxis: She is on Pradaxa Code Status: Full code Family Communication: No family present at bedside Disposition Plan: Home 1 ready  Consultants:   None, discussed with her primary cardiologist Dr. Sallyanne Kuster over the phone  Procedures:   2D echo:  Study Conclusions  - Left ventricle: The cavity size was normal. Systolic function was   normal. The estimated ejection fraction was in the range of 55% to 60%. Wall motion was normal; there were no regional wall motion abnormalities. There was a reduced contribution of atrial contraction to ventricular filling, due to increased ventricular diastolic pressure or atrial contractile dysfunction. Features are consistent with a pseudonormal left ventricular filling pattern, with concomitant abnormal relaxation and  increased filling pressure (grade 2 diastolic dysfunction). - Left atrium: The atrium was mildly dilated. - Right ventricle: The cavity size was mildly dilated. Wall thickness was normal. - Right atrium: The atrium was mildly dilated. - Tricuspid valve: There was mild-moderate regurgitation directed centrally. - Pulmonary arteries: Systolic  pressure was moderately to severely increased. PA peak pressure: 66 mm Hg (S).  Impressions:  - PA pressure has increased since the previous study. Recommend repeat limited echo after diuresis to reassess the PA pressure (can be performed during outpatient follow up).  Antimicrobials:  Levaquin 11/18 >>   Objective: Vitals:   04/21/18 0438 04/21/18 0851 04/21/18 1300 04/21/18 1348  BP: (!) 102/56 (!) 116/51  (!) 106/49  Pulse: 70 70 70 70  Resp: 19  20 (!) 21  Temp: 97.9 F (36.6 C)   97.6 F (36.4 C)  TempSrc: Oral   Oral  SpO2: 96%  (!) 76% 100%  Weight: 59.1 kg     Height:        Intake/Output Summary (Last 24 hours) at 04/21/2018 1402 Last data filed at 04/21/2018 1144 Gross per 24 hour  Intake 603 ml  Output 1270 ml  Net -667 ml   Filed Weights   04/19/18 1709 04/20/18 0445 04/21/18 0438  Weight: 59.6 kg 59 kg 59.1 kg    Examination:  Constitutional: Increased work of breathing but overall comfortable Eyes: No scleral icterus ENMT: Moist mixed membranes Respiratory: Faint bibasilar crackles, no wheezing, no significant rhonchi  Cardiovascular: Regular rate and rhythm, no murmurs appreciated.  1+ pitting lower extremity edema Abdomen: Soft, nontender, nondistended, positive bowel sounds Musculoskeletal: No clubbing Skin: No new rashes Neurologic: Nonfocal, equal strength  Data Reviewed: I have independently reviewed following labs and imaging studies   CBC: Recent Labs  Lab 04/19/18 1140  WBC 7.2  NEUTROABS 5.7  HGB 10.1*  HCT 35.1*  MCV 80.0  PLT 256   Basic Metabolic Panel: Recent Labs  Lab 04/19/18 1140 04/20/18 0443 04/21/18 0335  NA 137 139 139  K 3.3* 3.4* 3.7  CL 102 104 101  CO2 24 28 30   GLUCOSE 128* 116* 100*  BUN 10 8 8   CREATININE 0.60 0.90 1.00  CALCIUM 8.9 8.5* 8.8*   GFR: Estimated Creatinine Clearance: 39.2 mL/min (by C-G formula based on SCr of 1 mg/dL). Liver Function Tests: Recent Labs  Lab 04/19/18 1140  AST  22  ALT 22  ALKPHOS 81  BILITOT 1.1  PROT 6.7  ALBUMIN 3.9   No results for input(s): LIPASE, AMYLASE in the last 168 hours. No results for input(s): AMMONIA in the last 168 hours. Coagulation Profile: No results for input(s): INR, PROTIME in the last 168 hours. Cardiac Enzymes: Recent Labs  Lab 04/19/18 1140 04/19/18 1758 04/19/18 2316 04/20/18 0443  TROPONINI <0.03 <0.03 <0.03 <0.03   BNP (last 3 results) No results for input(s): PROBNP in the last 8760 hours. HbA1C: No results for input(s): HGBA1C in the last 72 hours. CBG: Recent Labs  Lab 04/20/18 1233 04/20/18 1708 04/20/18 2111 04/21/18 0741 04/21/18 1104  GLUCAP 94 94 91 94 111*   Lipid Profile: No results for input(s): CHOL, HDL, LDLCALC, TRIG, CHOLHDL, LDLDIRECT in the last 72 hours. Thyroid Function Tests: No results for input(s): TSH, T4TOTAL, FREET4, T3FREE, THYROIDAB in the last 72 hours. Anemia Panel: No results for input(s): VITAMINB12, FOLATE, FERRITIN, TIBC, IRON, RETICCTPCT in the last 72 hours. Urine analysis:    Component Value Date/Time   COLORURINE YELLOW 04/19/2018 1214  APPEARANCEUR CLEAR 04/19/2018 1214   LABSPEC 1.010 04/19/2018 1214   PHURINE 6.5 04/19/2018 [place] 04/19/2018 [place] 04/19/2018 [place] 04/19/2018 [place] 04/19/2018 1214   PROTEINUR NEGATIVE 04/19/2018 1214   UROBILINOGEN 0.2 02/26/2017 1930   NITRITE NEGATIVE 04/19/2018 1214   LEUKOCYTESUR NEGATIVE 04/19/2018 1214   Sepsis Labs: Invalid input(s): PROCALCITONIN, LACTICIDVEN  No results found for this or any previous visit (from the past 240 hour(s)).   Radiology Studies: No results found.  Marzetta Board, MD, PhD [facility] Pager [phone]  If 7PM-7AM, please contact night-coverage [email or web address] Password [email or web address] 04/21/2018, 2:02 PM

## 2018-04-22 ENCOUNTER — Encounter (HOSPITAL_COMMUNITY)
Admission: EM | Disposition: A | Discharge status: Home Health | Payer: Self-pay | Source: Home / Self Care | Attending: Internal Medicine

## 2018-04-22 DIAGNOSIS — I2510 Atherosclerotic heart disease of native coronary artery without angina pectoris: Secondary | ICD-10-CM

## 2018-04-22 DIAGNOSIS — J9601 Acute respiratory failure with hypoxia: Secondary | ICD-10-CM

## 2018-04-22 DIAGNOSIS — I495 Sick sinus syndrome: Secondary | ICD-10-CM

## 2018-04-22 DIAGNOSIS — I10 Essential (primary) hypertension: Secondary | ICD-10-CM

## 2018-04-22 DIAGNOSIS — I2721 Secondary pulmonary arterial hypertension: Secondary | ICD-10-CM

## 2018-04-22 DIAGNOSIS — I272 Pulmonary hypertension, unspecified: Secondary | ICD-10-CM

## 2018-04-22 DIAGNOSIS — I5033 Acute on chronic diastolic (congestive) heart failure: Secondary | ICD-10-CM

## 2018-04-22 DIAGNOSIS — I441 Atrioventricular block, second degree: Secondary | ICD-10-CM

## 2018-04-22 DIAGNOSIS — K766 Portal hypertension: Secondary | ICD-10-CM

## 2018-04-22 DIAGNOSIS — I480 Paroxysmal atrial fibrillation: Secondary | ICD-10-CM

## 2018-04-22 DIAGNOSIS — Z950 Presence of cardiac pacemaker: Secondary | ICD-10-CM

## 2018-04-22 HISTORY — PX: ULTRASOUND GUIDANCE FOR VASCULAR ACCESS: SHX6516

## 2018-04-22 HISTORY — PX: RIGHT HEART CATH: CATH118263

## 2018-04-22 LAB — BASIC METABOLIC PANEL
Anion gap: 9 (ref 5–15)
BUN: 10 mg/dL (ref 8–23)
CO2: 30 mmol/L (ref 22–32)
CREATININE: 0.9 mg/dL (ref 0.44–1.00)
Calcium: 8.7 mg/dL — ABNORMAL LOW (ref 8.9–10.3)
Chloride: 100 mmol/L (ref 98–111)
GFR calc Af Amer: >60 mL/min (ref >60)
GLUCOSE: 119 mg/dL — AB (ref 70–99)
POTASSIUM: 3.1 mmol/L — AB (ref 3.5–5.1)
Sodium: 139 mmol/L (ref 135–145)

## 2018-04-22 LAB — POCT I-STAT 3, VENOUS BLOOD GAS (G3P V)
ACID-BASE EXCESS: 5 mmol/L — AB (ref 0.0–2.0)
Acid-Base Excess: 4 mmol/L — ABNORMAL HIGH (ref 0.0–2.0)
Bicarbonate: 28.9 mmol/L — ABNORMAL HIGH (ref 20.0–28.0)
Bicarbonate: 29.8 mmol/L — ABNORMAL HIGH (ref 20.0–28.0)
O2 SAT: 55 %
O2 SAT: 55 %
PO2 VEN: 28 mmHg — AB (ref 32.0–45.0)
TCO2: 30 mmol/L (ref 22–32)
TCO2: 31 mmol/L (ref 22–32)
pCO2, Ven: 43.7 mmHg — ABNORMAL LOW (ref 44.0–60.0)
pCO2, Ven: 44.2 mmHg (ref 44.0–60.0)
pH, Ven: 7.429 (ref 7.250–7.430)
pH, Ven: 7.437 — ABNORMAL HIGH (ref 7.250–7.430)
pO2, Ven: 28 mmHg — CL (ref 32.0–45.0)

## 2018-04-22 LAB — GLUCOSE, CAPILLARY
GLUCOSE-CAPILLARY: 103 mg/dL — AB (ref 70–99)
GLUCOSE-CAPILLARY: 120 mg/dL — AB (ref 70–99)
GLUCOSE-CAPILLARY: 157 mg/dL — AB (ref 70–99)
Glucose-Capillary: 99 mg/dL (ref 70–99)

## 2018-04-22 LAB — CBC
HEMATOCRIT: 32.7 % — AB (ref 36.0–46.0)
HEMOGLOBIN: 9.4 g/dL — AB (ref 12.0–15.0)
MCH: 22.7 pg — AB (ref 26.0–34.0)
MCHC: 28.7 g/dL — AB (ref 30.0–36.0)
MCV: 79 fL — ABNORMAL LOW (ref 80.0–100.0)
Platelets: 273 10*3/uL (ref 150–400)
RBC: 4.14 MIL/uL (ref 3.87–5.11)
RDW: 15.8 % — ABNORMAL HIGH (ref 11.5–15.5)
WBC: 5.8 10*3/uL (ref 4.0–10.5)
nRBC: 0 % (ref 0.0–0.2)

## 2018-04-22 SURGERY — RIGHT HEART CATH
Anesthesia: LOCAL

## 2018-04-22 MED ORDER — SODIUM CHLORIDE 0.9% FLUSH: 3.0000 mL | INTRAVENOUS | Status: DC | PRN

## 2018-04-22 MED ORDER — GUAIFENESIN-DM 100-10 MG/5ML PO SYRP
5.0000 mL | ORAL_SOLUTION | ORAL | Status: DC | PRN
  Administered 2018-04-23: 5 mL via ORAL
  Filled 2018-04-22: qty 5

## 2018-04-22 MED ORDER — LIDOCAINE HCL (PF) 1 % IJ SOLN
INTRAMUSCULAR | Status: DC | PRN
  Administered 2018-04-22: 2 mL

## 2018-04-22 MED ORDER — IOHEXOL 350 MG/ML SOLN
INTRAVENOUS | Status: DC | PRN
  Administered 2018-04-22: 5 mL via INTRAVENOUS

## 2018-04-22 MED ORDER — SODIUM CHLORIDE 0.9% FLUSH
3.0000 mL | Freq: Two times a day (BID) | INTRAVENOUS | Status: DC
  Administered 2018-04-22: 3 mL via INTRAVENOUS

## 2018-04-22 MED ORDER — HEPARIN (PORCINE) IN NACL 1000-0.9 UT/500ML-% IV SOLN
INTRAVENOUS | Status: AC
  Filled 2018-04-22: qty 500

## 2018-04-22 MED ORDER — SODIUM CHLORIDE 0.9 % IV SOLN: 250.0000 mL | INTRAVENOUS | Status: DC | PRN

## 2018-04-22 MED ORDER — SODIUM CHLORIDE 0.9 % IV SOLN: INTRAVENOUS | Status: DC

## 2018-04-22 MED ORDER — FUROSEMIDE 10 MG/ML IJ SOLN
40.0000 mg | Freq: Two times a day (BID) | INTRAMUSCULAR | Status: AC
  Administered 2018-04-22: 40 mg via INTRAVENOUS
  Filled 2018-04-22: qty 4

## 2018-04-22 MED ORDER — LIDOCAINE HCL (PF) 1 % IJ SOLN
INTRAMUSCULAR | Status: AC
  Filled 2018-04-22: qty 30

## 2018-04-22 MED ORDER — POTASSIUM CHLORIDE CRYS ER 20 MEQ PO TBCR
40.0000 meq | EXTENDED_RELEASE_TABLET | Freq: Once | ORAL | Status: AC
  Administered 2018-04-22: 40 meq via ORAL

## 2018-04-22 MED ORDER — HEPARIN (PORCINE) IN NACL 1000-0.9 UT/500ML-% IV SOLN
INTRAVENOUS | Status: DC | PRN
  Administered 2018-04-22: 500 mL

## 2018-04-22 MED ORDER — FUROSEMIDE 40 MG PO TABS
40.0000 mg | ORAL_TABLET | Freq: Every day | ORAL | Status: DC
  Administered 2018-04-23 – 2018-04-24 (×2): 40 mg via ORAL
  Filled 2018-04-22 (×2): qty 1

## 2018-04-22 MED ORDER — LOSARTAN POTASSIUM 25 MG PO TABS
25.0000 mg | ORAL_TABLET | Freq: Every day | ORAL | Status: DC
  Administered 2018-04-22 – 2018-04-24 (×3): 25 mg via ORAL
  Filled 2018-04-22 (×3): qty 1

## 2018-04-22 SURGICAL SUPPLY — 8 items
CATH BALLN WEDGE 5F 110CM (CATHETERS) ×1 IMPLANT
GUIDEWIRE .025 260CM (WIRE) ×1 IMPLANT
KIT HEART LEFT (KITS) ×1 IMPLANT
PACK CARDIAC CATHETERIZATION (CUSTOM PROCEDURE TRAY) ×1 IMPLANT
SHEATH GLIDE SLENDER 4/5FR (SHEATH) ×2 IMPLANT
SHEATH PROBE COVER 6X72 (BAG) ×1 IMPLANT
TRANSDUCER W/STOPCOCK (MISCELLANEOUS) ×1 IMPLANT
TUBING CIL FLEX 10 FLL-RA (TUBING) ×1 IMPLANT

## 2018-04-22 NOTE — Care Management Important Message (Signed)
Important Message  Patient Details  Name: Hailey [name] MRN: [id number] Date of Birth: 02-Oct-1945   Medicare Important Message Given:  Yes    Amani Marseille [address] 04/22/2018, 3:11 PM

## 2018-04-22 NOTE — Progress Notes (Signed)
PROGRESS NOTE  Hailey [name] [id number]:[id number] DOB: [birth year] DOA: 04/19/2018 PCP: Jani Gravel, MD   LOS: 3 days   Brief Narrative / Interim history: 72 year old female with history of hypertension, diabetes, chronic diastolic CHF, A. fib/sick sinus syndrome with pacemaker, was admitted to the hospital on 11/17 with relatively acute onset of shortness of breath.  She reports a cough for the prior 2 days, weakness, and she was concerned that she would get an upper respiratory infection with sinus congestion and pressure.  Cough has been nonproductive.  Subjective: -Feels improved, states that she has more energy today than yesterday.  No shortness of breath, no chest pain, no palpitations  Assessment & Plan: Principal Problem:   Acute on chronic diastolic CHF (congestive heart failure), NYHA class 1 (HCC) Active Problems:   Atrial fibrillation (HCC)   CAD (coronary artery disease)   Second degree AV block   Dyspnea   Hyperthyroidism   Essential hypertension   Acute hypoxic respiratory failure due to acute on chronic diastolic CHF/pulmonary hypertension -Patient with evidence of fluid overload with lower extremity edema, pulmonary edema on chest x-ray on admission -2D echo obtained showed normal EF, grade 2 diastolic dysfunction as well as her PA pressure has increased compared to the prior study.  -She was maintained on IV Lasix for a few days now, her sats are okay at rest however desatted to 81% on ambulating on room air again this morning requiring 4 L supplemental oxygen.  She does not look at that fluid overloaded as the level of her hypoxia.  I have consulted cardiology given significant worsening of her pulmonary hypertension, appreciate input  Lobar pneumonia -Chest x-ray with infiltrate in the medial right lung base, she did have URI type symptoms as well as cough and may be indicative of early pneumonia.  Would favor to treat with Levaquin for a few days. -Today day  3/5.  Chronic A. fib -Now rate controlled with PPM, continue Pradaxa for anticoagulation  Diabetes mellitus -Continue sliding scale, CBGs 103-120 this morning  Sick sinus syndrome -Pacemaker in place  Hypertension -Blood pressure within normal parameters, continue regimen as below   Scheduled Meds: . dabigatran  150 mg Oral BID  . diltiazem  180 mg Oral Daily  . furosemide  40 mg Intravenous BID  . insulin aspart  0-9 Units Subcutaneous TID WC  . levofloxacin  250 mg Oral Daily  . losartan  25 mg Oral Daily  . metoprolol tartrate  50 mg Oral BID  . oxybutynin  5 mg Oral Daily  . pantoprazole  40 mg Oral Daily  . potassium chloride  40 mEq Oral Once  . propylthiouracil  100 mg Oral q morning - 10a  . propylthiouracil  50 mg Oral QHS  . sodium chloride flush  3 mL Intravenous Q12H  . zolpidem  5 mg Oral Once   Continuous Infusions: . sodium chloride     PRN Meds:.sodium chloride, acetaminophen, loperamide, ondansetron (ZOFRAN) IV, sodium chloride flush  DVT prophylaxis: She is on Pradaxa Code Status: Full code Family Communication: No family present at bedside Disposition Plan: Home when ready  Consultants:   Cardiology  Procedures:   2D echo:  Study Conclusions  - Left ventricle: The cavity size was normal. Systolic function was   normal. The estimated ejection fraction was in the range of 55% to 60%. Wall motion was normal; there were no regional wall motion abnormalities. There was a reduced contribution of atrial contraction to ventricular filling, due  to increased ventricular diastolic pressure or atrial contractile dysfunction. Features are consistent with a pseudonormal left ventricular filling pattern, with concomitant abnormal relaxation and increased filling pressure (grade 2 diastolic dysfunction). - Left atrium: The atrium was mildly dilated. - Right ventricle: The cavity size was mildly dilated. Wall thickness was normal. - Right atrium: The atrium was  mildly dilated. - Tricuspid valve: There was mild-moderate regurgitation directed centrally. - Pulmonary arteries: Systolic pressure was moderately to severely increased. PA peak pressure: 66 mm Hg (S).  Impressions:  - PA pressure has increased since the previous study. Recommend repeat limited echo after diuresis to reassess the PA pressure (can be performed during outpatient follow up).  Antimicrobials:  Levaquin 11/18 >>   Objective: Vitals:   04/22/18 0507 04/22/18 0828 04/22/18 0903 04/22/18 1043  BP: (!) 98/57 (!) 112/53    Pulse: 70 63    Resp: 19 20    Temp:      TempSrc:      SpO2: 99% 98% (!) 80% 99%  Weight: 59 kg     Height:        Intake/Output Summary (Last 24 hours) at 04/22/2018 1146 Last data filed at 04/21/2018 2159 Gross per 24 hour  Intake 540 ml  Output 600 ml  Net -60 ml   Filed Weights   04/20/18 0445 04/21/18 0438 04/22/18 0507  Weight: 59 kg 59.1 kg 59 kg    Examination:  Constitutional: Appears comfortable this morning Eyes: No icterus seen ENMT: Moist mucous membranes Respiratory: No crackles or wheezing heard, no significant rhonchi, good air movement Cardiovascular: Regular rate and rhythm, no murmurs.  Trace lower extremity edema Abdomen: Soft, nontender, nondistended, positive bowel sounds Musculoskeletal: No cyanosis Skin: No rashes seen Neurologic: No focal deficits, equal strength, ambulatory  Data Reviewed: I have independently reviewed following labs and imaging studies   CBC: Recent Labs  Lab 04/19/18 1140 04/22/18 0302  WBC 7.2 5.8  NEUTROABS 5.7  --   HGB 10.1* 9.4*  HCT 35.1* 32.7*  MCV 80.0 79.0*  PLT 286 081   Basic Metabolic Panel: Recent Labs  Lab 04/19/18 1140 04/20/18 0443 04/21/18 0335 04/22/18 0302  NA 137 139 139 139  K 3.3* 3.4* 3.7 3.1*  CL 102 104 101 100  CO2 24 28 30 30   GLUCOSE 128* 116* 100* 119*  BUN 10 8 8 10   CREATININE 0.60 0.90 1.00 0.90  CALCIUM 8.9 8.5* 8.8* 8.7*    GFR: Estimated Creatinine Clearance: 43.5 mL/min (by C-G formula based on SCr of 0.9 mg/dL). Liver Function Tests: Recent Labs  Lab 04/19/18 1140  AST 22  ALT 22  ALKPHOS 81  BILITOT 1.1  PROT 6.7  ALBUMIN 3.9   No results for input(s): LIPASE, AMYLASE in the last 168 hours. No results for input(s): AMMONIA in the last 168 hours. Coagulation Profile: No results for input(s): INR, PROTIME in the last 168 hours. Cardiac Enzymes: Recent Labs  Lab 04/19/18 1140 04/19/18 1758 04/19/18 2316 04/20/18 0443  TROPONINI <0.03 <0.03 <0.03 <0.03   BNP (last 3 results) No results for input(s): PROBNP in the last 8760 hours. HbA1C: No results for input(s): HGBA1C in the last 72 hours. CBG: Recent Labs  Lab 04/21/18 1104 04/21/18 1616 04/21/18 2148 04/22/18 0728 04/22/18 1118  GLUCAP 111* 123* 102* 103* 120*   Lipid Profile: No results for input(s): CHOL, HDL, LDLCALC, TRIG, CHOLHDL, LDLDIRECT in the last 72 hours. Thyroid Function Tests: No results for input(s): TSH, T4TOTAL, FREET4, T3FREE, THYROIDAB  in the last 72 hours. Anemia Panel: No results for input(s): VITAMINB12, FOLATE, FERRITIN, TIBC, IRON, RETICCTPCT in the last 72 hours. Urine analysis:    Component Value Date/Time   COLORURINE YELLOW 04/19/2018 [place] 04/19/2018 1214   LABSPEC 1.010 04/19/2018 1214   PHURINE 6.5 04/19/2018 [place] 04/19/2018 1214   HGBUR NEGATIVE 04/19/2018 1214   BILIRUBINUR NEGATIVE 04/19/2018 1214   KETONESUR NEGATIVE 04/19/2018 1214   PROTEINUR NEGATIVE 04/19/2018 1214   UROBILINOGEN 0.2 02/26/2017 1930   NITRITE NEGATIVE 04/19/2018 1214   LEUKOCYTESUR NEGATIVE 04/19/2018 1214   Sepsis Labs: Invalid input(s): PROCALCITONIN, LACTICIDVEN  No results found for this or any previous visit (from the past 240 hour(s)).   Radiology Studies: No results found.  Marzetta Board, MD, PhD [facility] Pager [phone]  If 7PM-7AM, please  contact night-coverage [email or web address] Password [facility] 04/22/2018, 11:46 AM

## 2018-04-22 NOTE — Consult Note (Addendum)
Cardiology Consultation:   Patient ID: Hailey [name] MRN: [id number]; DOB: July 20, 1945  Admit date: 04/19/2018 Date of Consult: 04/22/2018  Primary Care Provider: Jani Gravel, MD Primary Cardiologist: Sanda Klein, MD  Primary Electrophysiologist:  None    Patient Profile:   Hailey [name] is a 72 y.o. female with a hx of second-degree atrioventricular block tachyardia-bradycardia syndrome (paroxysmal atrial fibrillation/sinus node dysfunction) and dual-chamber permanent pacemaker ([employer]), moderate CAD, chronic diastolic heart failure, essential hypertension who is being seen today for the evaluation of pulmonary artery hypertension at the request of Dr. Cruzita Lederer.  History of Present Illness:   Hailey [name] has had well compensated diastolic dysfunction, asymptomatic moderate CAD (70% RCA stenosis cath 2014, treated medically), frequent paroxysmal atrial fibrillation on chronic anticoagulation with Pradaxa, was admitted for acute on chronic diastolic heart failure and possible medial right lung base pneumonia associated with marked hypoxemia.  Her echocardiogram performed on this admission continues to show normal left ventricular systolic function and regional wall motion and grade 2 diastolic dysfunction, but also unexpectedly severe pulmonary artery hypertension.  She has been afebrile and does not have an elevated white blood cell count.  She has had upper respiratory symptoms, prominent sinus congestion, she has not had cough or hemoptysis. She has been compliant with long-term anticoagulant therapy.  She does not have orthopnea.  Since admission she has diuresed 1.5 L by in/out measurements and has lost 5 pounds by weights.  Despite antibiotic therapy and diuretics, she remains markedly hypoxic with oxygen saturation of only 81% on room air, correcting to 95% on 4 L oxygen by nasal cannula.  Hailey [name] has been under a lot of emotional stress for about a year due to her husband Hailey [name]  rapidly declining cognitive status (he is presumed to have Lewy body dementia).  Hailey [name] recently underwent implantation of a dual-chamber permanent pacemaker for bradycardia, complicated by pneumothorax and lead microperforation requiring repositioning.  During this time she has been at his side every day and may have been less compliant with her typical sodium restriction.  Past Medical History:  Diagnosis Date  . Acute on chronic diastolic CHF (congestive heart failure), NYHA class 1 ([place]) 04/29/2013  . Allergic rhinitis   . Brady-tachy syndrome (HCC)    s/p STJ dual chamber PPM by Dr Sallyanne Kuster  . CAD (coronary artery disease)    70% RCA stenosis, treated medically  . Diabetes mellitus ([place])   . DJD (degenerative joint disease)   . Dysrhythmia    Paroxysmal Atrial Fib.  . Hypertension   . Hyperthyroidism   . Paroxysmal atrial fibrillation (HCC)   . Polio    as a child  . PONV (postoperative nausea and vomiting)   . Presence of permanent cardiac pacemaker     Past Surgical History:  Procedure Laterality Date  . arm surgery d/t fx Right 96  . ATRIAL FIBRILLATION ABLATION  07/31/2012; 08-10-13   PVI by Dr Rayann Heman  . ATRIAL FIBRILLATION ABLATION N/A 07/31/2012   Procedure: ATRIAL FIBRILLATION ABLATION;  Surgeon: Thompson Grayer, MD;  Location: [facility] CATH LAB;  Service: Cardiovascular;  Laterality: N/A;  . ATRIAL FIBRILLATION ABLATION N/A 08/10/2013   Procedure: ATRIAL FIBRILLATION ABLATION;  Surgeon: Coralyn Mark, MD;  Location: [facility] CATH LAB;  Service: Cardiovascular;  Laterality: N/A;  . CARDIAC CATHETERIZATION  12/03/2010   single vessel mid RCA  . CHOLECYSTECTOMY  1994  . COLONOSCOPY WITH PROPOFOL N/A 03/21/2015   Procedure: COLONOSCOPY WITH PROPOFOL;  Surgeon: Juanita Craver, MD;  Location: [facility]  ENDOSCOPY;  Service: Endoscopy;  Laterality: N/A;  . LEFT HEART CATH AND CORONARY ANGIOGRAPHY N/A 07/30/2017   Procedure: LEFT HEART CATH AND CORONARY ANGIOGRAPHY;  Surgeon: Troy Sine, MD;   Location: [facility] CV LAB;  Service: Cardiovascular;  Laterality: N/A;  . LUMBAR LAMINECTOMY/DECOMPRESSION MICRODISCECTOMY Right 12/23/2012   Procedure: Right L5-S1 Laminotomy for resection of synovial cyst;  Surgeon: Hosie Spangle, MD;  Location: [facility] NEURO ORS;  Service: Neurosurgery;  Laterality: Right;  Right Lumbar five-sacral one Laminotomy for resection of synovial cyst  . NM MYOCAR PERF WALL MOTION  12/20/2010   normal  . PACEMAKER INSERTION  12/03/10   SJM Accent DR RF implanted by Dr Sallyanne Kuster  . TEE WITHOUT CARDIOVERSION N/A 07/30/2012   Procedure: TRANSESOPHAGEAL ECHOCARDIOGRAM (TEE);  Surgeon: Sanda Klein, MD;  Location: [facility] ENDOSCOPY;  Service: Cardiovascular;  Laterality: N/A;  h&p in file-[place]  . TEE WITHOUT CARDIOVERSION N/A 08/09/2013   Procedure: TRANSESOPHAGEAL ECHOCARDIOGRAM (TEE);  Surgeon: Lelon Perla, MD;  Location: [facility] ENDOSCOPY;  Service: Cardiovascular;  Laterality: N/A;  . TOTAL ABDOMINAL HYSTERECTOMY  1981     Home Medications:  Prior to Admission medications   Medication Sig Start Date End Date Taking? Authorizing Provider  Cholecalciferol (VITAMIN D3) 1000 UNITS CAPS Take 1 capsule by mouth daily.    Yes [provider]  Cyanocobalamin (VITAMIN B12 PO) Take 1 tablet by mouth daily as needed (for energy).    Yes [provider]  DILT-XR 180 MG 24 hr capsule TAKE 1 CAPSULE BY MOUTH ONCE DAILY Patient taking differently: Take 180 mg by mouth daily.  01/06/18  Yes Mayumi Summerson, MD  furosemide (LASIX) 40 MG tablet Take 1 tablet (40 mg total) by mouth daily. 07/24/17  Yes Aniylah Avans, MD  loperamide (IMODIUM A-D) 2 MG tablet Take 2 mg by mouth as needed for diarrhea or loose stools.    Yes [provider]  losartan (COZAAR) 25 MG tablet Take 25 mg by mouth daily.  05/22/17  Yes [provider]  metoprolol tartrate (LOPRESSOR) 50 MG tablet Take 1 tablet (50 mg total) by mouth 2 (two) times daily. 07/24/17  Yes Yamin Swingler,  MD  OVER THE COUNTER MEDICATION Take 1 tablet by mouth daily. Claritin   Yes [provider]  oxybutynin (DITROPAN) 5 MG tablet Take 5 mg by mouth daily.  09/07/15  Yes [provider]  pantoprazole (PROTONIX) 40 MG tablet Take 40 mg by mouth daily.   Yes [provider]  potassium chloride SA (KLOR-CON M20) 20 MEQ tablet Take 1 tablet (20 mEq total) by mouth daily. Patient taking differently: Take 10 mEq by mouth daily.  07/24/17 07/19/18 Yes Lacresha Fusilier, MD  PRADAXA 150 MG CAPS capsule TAKE 1 CAPSULE BY MOUTH TWICE DAILY Patient taking differently: Take 150 mg by mouth 2 (two) times daily.  11/18/17  Yes Allred, Jeneen Rinks, MD  propylthiouracil (PTU) 50 MG tablet Take 50-100 mg by mouth See admin instructions. Take 100 mg by mouth in the morning and take 50 mg by mouth at bedtime   Yes [provider]  saxagliptin HCl (ONGLYZA) 5 MG TABS tablet Take 5 mg by mouth daily.   Yes [provider]    Inpatient Medications: Scheduled Meds: . dabigatran  150 mg Oral BID  . diltiazem  180 mg Oral Daily  . furosemide  40 mg Intravenous BID  . insulin aspart  0-9 Units Subcutaneous TID WC  . levofloxacin  250 mg Oral Daily  .  losartan  25 mg Oral Daily  . metoprolol tartrate  50 mg Oral BID  . oxybutynin  5 mg Oral Daily  . pantoprazole  40 mg Oral Daily  . potassium chloride  40 mEq Oral Once  . propylthiouracil  100 mg Oral q morning - 10a  . propylthiouracil  50 mg Oral QHS  . sodium chloride flush  3 mL Intravenous Q12H  . zolpidem  5 mg Oral Once   Continuous Infusions: . sodium chloride     PRN Meds: sodium chloride, acetaminophen, loperamide, ondansetron (ZOFRAN) IV, sodium chloride flush  Allergies:    Allergies  Allergen Reactions  . Codeine Nausea And Vomiting and Nausea Only  . Ketorolac Nausea And Vomiting, Nausea Only and Other (See Comments)    "pt told to never take this med"  . Meperidine Nausea Only  . Demerol Nausea And  Vomiting  . Dronedarone Other (See Comments)    Increased fluid retention  . Cyclobenzaprine Nausea And Vomiting    Social History:   Social History   Socioeconomic History  . Marital status: Married    Spouse name: Hailey [name]  . Number of children: Not on file  . Years of education: Not on file  . Highest education level: Not on file  Occupational History  . Occupation: housewife  Social Needs  . Financial resource strain: Not on file  . Food insecurity:    Worry: Not on file    Inability: Not on file  . Transportation needs:    Medical: Not on file    Non-medical: Not on file  Tobacco Use  . Smoking status: Never Smoker  . Smokeless tobacco: Never Used  Substance and Sexual Activity  . Alcohol use: No  . Drug use: No  . Sexual activity: Not Currently    Birth control/protection: Abstinence  Lifestyle  . Physical activity:    Days per week: Not on file    Minutes per session: Not on file  . Stress: Not on file  Relationships  . Social connections:    Talks on phone: Not on file    Gets together: Not on file    Attends religious service: Not on file    Active member of club or organization: Not on file    Attends meetings of clubs or organizations: Not on file    Relationship status: Not on file  . Intimate partner violence:    Fear of current or ex partner: Not on file    Emotionally abused: Not on file    Physically abused: Not on file    Forced sexual activity: Not on file  Other Topics Concern  . Not on file  Social History Narrative   Lives in [place].  Retired.    Family History:    Family History  Problem Relation Age of Onset  . Heart disease Sister   . Breast cancer Sister   . Breast cancer Sister      ROS:  Please see the history of present illness.   All other ROS reviewed and negative.     Physical Exam/Data:   Vitals:   04/22/18 0507 04/22/18 0828 04/22/18 0903 04/22/18 1043  BP: (!) 98/57 (!) 112/53    Pulse: 70 63    Resp: 19  20    Temp:      TempSrc:      SpO2: 99% 98% (!) 80% 99%  Weight: 59 kg     Height:  Intake/Output Summary (Last 24 hours) at 04/22/2018 1135 Last data filed at 04/21/2018 2159 Gross per 24 hour  Intake 540 ml  Output 900 ml  Net -360 ml   Filed Weights   04/20/18 0445 04/21/18 0438 04/22/18 0507  Weight: 59 kg 59.1 kg 59 kg   Body mass index is 26.27 kg/m.  General:  Well nourished, well developed, in no acute distress HEENT: normal Lymph: no adenopathy Neck: no JVD Endocrine:  No thryomegaly Vascular: No carotid bruits; FA pulses 2+ bilaterally without bruits  Cardiac:  normal S1, S2; RRR; no murmur, no gallop; the left subclavian pacemaker site Lungs: Few coarse breath sounds/rhonchi heard in the lung base, no overt moist rales, no wheezing, rhonchi or rales  Abd: soft, nontender, no hepatomegaly  Ext: no edema Musculoskeletal:  No deformities, BUE and BLE strength normal and equal Skin: warm and dry  Neuro:  CNs 2-12 intact, no focal abnormalities noted Psych:  Normal affect   EKG:  The EKG was personally reviewed and demonstrates: Sinus rhythm/atrial paced beats, native AV conduction with narrow QRS but minor IVCD RSR prime in lead V1; nonspecific repolarization abnormalities are seen Telemetry:  Telemetry was personally reviewed and demonstrates: Atrial fibrillation with controlled ventricular response and intermittent ventricular pacing  Relevant CV Studies:  CATH 07/30/2017:  Ost 1st Diag to 1st Diag lesion is 10% stenosed.  Mid RCA lesion is 20% stenosed.  The left ventricular systolic function is normal.  LV end diastolic pressure is normal.  No significant coronary obstructive disease with mild smooth 10% narrowing in the proximal to midportion of the first diagonal branch of the LAD; normal left circumflex coronary artery; and smooth 20% mid RCA narrowing.  Normal LV function without focal segmental wall motion abnormalities. LVEDP 7.  Echo  04/20/2018 - Left ventricle: The cavity size was normal. Systolic function was   normal. The estimated ejection fraction was in the range of 55%   to 60%. Wall motion was normal; there were no regional wall   motion abnormalities. There was a reduced contribution of atrial   contraction to ventricular filling, due to increased ventricular   diastolic pressure or atrial contractile dysfunction. Features   are consistent with a pseudonormal left ventricular filling   pattern, with concomitant abnormal relaxation and increased   filling pressure (grade 2 diastolic dysfunction). - Left atrium: The atrium was mildly dilated. - Right ventricle: The cavity size was mildly dilated. Wall   thickness was normal. - Right atrium: The atrium was mildly dilated. - Tricuspid valve: There was mild-moderate regurgitation directed   centrally. - Pulmonary arteries: Systolic pressure was moderately to severely   increased. PA peak pressure: 66 mm Hg (S).  Impressions:  - PA pressure has increased since the previous study. Recommend   repeat limited echo after diuresis to reassess the PA pressure   (can be performed during outpatient follow up).  Laboratory Data:  Chemistry Recent Labs  Lab 04/20/18 0443 04/21/18 0335 04/22/18 0302  NA 139 139 139  K 3.4* 3.7 3.1*  CL 104 101 100  CO2 28 30 30   GLUCOSE 116* 100* 119*  BUN 8 8 10   CREATININE 0.90 1.00 0.90  CALCIUM 8.5* 8.8* 8.7*  GFRNONAA >60 55* >60  GFRAA >60 >60 >60  ANIONGAP 7 8 9     Recent Labs  Lab 04/19/18 1140  PROT 6.7  ALBUMIN 3.9  AST 22  ALT 22  ALKPHOS 81  BILITOT 1.1   Hematology Recent Labs  Lab 04/19/18 1140 04/22/18 0302  WBC 7.2 5.8  RBC 4.39 4.14  HGB 10.1* 9.4*  HCT 35.1* 32.7*  MCV 80.0 79.0*  MCH 23.0* 22.7*  MCHC 28.8* 28.7*  RDW 16.2* 15.8*  PLT 286 273   Cardiac Enzymes Recent Labs  Lab 04/19/18 1140 04/19/18 1758 04/19/18 2316 04/20/18 0443  TROPONINI <0.03 <0.03 <0.03 <0.03   No  results for input(s): TROPIPOC in the last 168 hours.  BNP Recent Labs  Lab 04/19/18 1140  BNP 393.4*    DDimer No results for input(s): DDIMER in the last 168 hours.  Radiology/Studies:  Dg Chest 2 View  Result Date: 04/19/2018 CLINICAL DATA:  Nausea and shortness of breath for several hours EXAM: CHEST - 2 VIEW COMPARISON:  09/11/2017 FINDINGS: Cardiac shadow is enlarged but stable. Defibrillator is again seen. Patchy infiltrate is noted in the medial right lung base with chronic blunting of the costophrenic angle. Stable scarring in the left base is seen as well. No other focal abnormality is noted. IMPRESSION: Chronic changes in the bases bilaterally with some new increased right medial infiltrate. Electronically Signed   By: Inez Catalina M.D.   On: 04/19/2018 11:43    Assessment and Plan:   1. CHF: Preserved left ventricular ejection fraction.  Has had diastolic heart failure in the past, but has been well compensated for many years.  As far as I can tell from a clinical standpoint, she is now euvolemic.  She has diuresed 1.5 L since admission.  Her weight is down roughly 5 pounds since admission.  Her BNP at the onset of illness is only modestly elevated at 393.  Not sure that continue treatment with diuretics will fix her hypoxemia.  Note that when she underwent cardiac catheterization in February 2019 the LVEDP was low at 7 mmHg. 2. PAH: The degree of pulmonary artery hypertension is unexpected and substantially worsened compared to her previous echocardiogram in 2017.  It is possible that it is not entirely related to left heart failure and then she may have some intrinsic pulmonary arteriolar disease.  This could also explain the marked hypoxemia which is out of proportion to the abnormalities on chest x-ray and the absence of marked findings of hypervolemia.  I think it would be a good idea to perform a right heart catheterization and directly measure left heart filling pressures and  confirmed the echo finding of moderate to severe pulmonary hypertension.  She has received anticoagulants already today.  If we can obtain access for right heart catheterization with a large gauge antecubital IV, we could go ahead and do it today.  If we need to perform her catheterization from femoral vein approach we will hold her Pradaxa for 24 hours first. This procedure has been fully reviewed with the patient and written informed consent has been obtained.  I discussed with her daughter Lattie Haw, who is a Marine scientist. 3. AFib: Normal rhythm on admission, but in atrial fibrillation today.  As on previous occasions she is completely oblivious to the arrhythmia.  From previous pacemaker interrogation it is clear that she frequently has bouts of paroxysmal A. fib for an overall burden of about 14%.  Rate control has never been an issue.  I do not think the arrhythmia is in any way causing her current decompensation.  She has been compliant with anticoagulation for years.  Pulmonary embolism would therefore be an unlikely explanation for pulmonary artery hypertension and/or hypoxemia. 4. CAD: Angina has not been a complaint.  She  had single-vessel moderate disease (70% mid RCA stenosis) by cardiac catheterization in 2012, felt to be even less significant catheterization at cath February 2019. On statin. 5. PPM:  normal device function by an office check in July and remote download in August.  She did have a period of decompensation when she lost ventricular capture about a year ago, resolved by reprogramming her ventricular lead in unipolar pacing mode.  No evidence of loss of ventricular capture during telemetry monitoring in the hospital on this admission.  She is not device dependent.  6. SSS: When not in atrial fibrillation paces the atrium almost 100% of the time.   7. 2nd deg AVB: She has occasional periods of AV block, but only requires 20% ventricular pacing.  She was very symptomatic with loss of ventricular  capture earlier this year.      For questions or updates, please contact [place] Please consult [email or web address] for contact info under     Signed, Sanda Klein, MD  04/22/2018 11:35 AM

## 2018-04-22 NOTE — H&P (View-Only) (Signed)
Cardiology Consultation:   Patient ID: Hailey [name] MRN: [id number]; DOB: Oct 28, 1945  Admit date: 04/19/2018 Date of Consult: 04/22/2018  Primary Care Provider: Jani Gravel, MD Primary Cardiologist: Sanda Klein, MD  Primary Electrophysiologist:  None    Patient Profile:   Hailey [name] is a 72 y.o. female with a hx of second-degree atrioventricular block tachyardia-bradycardia syndrome (paroxysmal atrial fibrillation/sinus node dysfunction) and dual-chamber permanent pacemaker ([employer]), moderate CAD, chronic diastolic heart failure, essential hypertension who is being seen today for the evaluation of pulmonary artery hypertension at the request of Dr. Cruzita Lederer.  History of Present Illness:   Hailey [name] has had well compensated diastolic dysfunction, asymptomatic moderate CAD (70% RCA stenosis cath 2014, treated medically), frequent paroxysmal atrial fibrillation on chronic anticoagulation with Pradaxa, was admitted for acute on chronic diastolic heart failure and possible medial right lung base pneumonia associated with marked hypoxemia.  Her echocardiogram performed on this admission continues to show normal left ventricular systolic function and regional wall motion and grade 2 diastolic dysfunction, but also unexpectedly severe pulmonary artery hypertension.  She has been afebrile and does not have an elevated white blood cell count.  She has had upper respiratory symptoms, prominent sinus congestion, she has not had cough or hemoptysis. She has been compliant with long-term anticoagulant therapy.  She does not have orthopnea.  Since admission she has diuresed 1.5 L by in/out measurements and has lost 5 pounds by weights.  Despite antibiotic therapy and diuretics, she remains markedly hypoxic with oxygen saturation of only 81% on room air, correcting to 95% on 4 L oxygen by nasal cannula.  Hailey [name] has been under a lot of emotional stress for about a year due to her husband Hardin Negus  rapidly declining cognitive status (he is presumed to have Lewy body dementia).  Hailey [name] recently underwent implantation of a dual-chamber permanent pacemaker for bradycardia, complicated by pneumothorax and lead microperforation requiring repositioning.  During this time she has been at his side every day and may have been less compliant with her typical sodium restriction.  Past Medical History:  Diagnosis Date  . Acute on chronic diastolic CHF (congestive heart failure), NYHA class 1 ([place]) 04/29/2013  . Allergic rhinitis   . Brady-tachy syndrome (HCC)    s/p STJ dual chamber PPM by Dr Sallyanne Kuster  . CAD (coronary artery disease)    70% RCA stenosis, treated medically  . Diabetes mellitus ([place])   . DJD (degenerative joint disease)   . Dysrhythmia    Paroxysmal Atrial Fib.  . Hypertension   . Hyperthyroidism   . Paroxysmal atrial fibrillation (HCC)   . Polio    as a child  . PONV (postoperative nausea and vomiting)   . Presence of permanent cardiac pacemaker     Past Surgical History:  Procedure Laterality Date  . arm surgery d/t fx Right 96  . ATRIAL FIBRILLATION ABLATION  07/31/2012; 08-10-13   PVI by Dr Rayann Heman  . ATRIAL FIBRILLATION ABLATION N/A 07/31/2012   Procedure: ATRIAL FIBRILLATION ABLATION;  Surgeon: Thompson Grayer, MD;  Location[facility] CATH LAB;  Service: Cardiovascular;  Laterality: N/A;  . ATRIAL FIBRILLATION ABLATION N/A 08/10/2013   Procedure: ATRIAL FIBRILLATION ABLATION;  Surgeon: Coralyn Mark, MD;  Location: [facility] CATH LAB;  Service: Cardiovascular;  Laterality: N/A;  . CARDIAC CATHETERIZATION  12/03/2010   single vessel mid RCA  . CHOLECYSTECTOMY  1994  . COLONOSCOPY WITH PROPOFOL N/A 03/21/2015   Procedure: COLONOSCOPY WITH PROPOFOL;  Surgeon: Juanita Craver, MD;  Location: [facility]  ENDOSCOPY;  Service: Endoscopy;  Laterality: N/A;  . LEFT HEART CATH AND CORONARY ANGIOGRAPHY N/A 07/30/2017   Procedure: LEFT HEART CATH AND CORONARY ANGIOGRAPHY;  Surgeon: Troy Sine, MD;   Location: [facility] CV LAB;  Service: Cardiovascular;  Laterality: N/A;  . LUMBAR LAMINECTOMY/DECOMPRESSION MICRODISCECTOMY Right 12/23/2012   Procedure: Right L5-S1 Laminotomy for resection of synovial cyst;  Surgeon: Hosie Spangle, MD;  Location: [facility] NEURO ORS;  Service: Neurosurgery;  Laterality: Right;  Right Lumbar five-sacral one Laminotomy for resection of synovial cyst  . NM MYOCAR PERF WALL MOTION  12/20/2010   normal  . PACEMAKER INSERTION  12/03/10   SJM Accent DR RF implanted by Dr Sallyanne Kuster  . TEE WITHOUT CARDIOVERSION N/A 07/30/2012   Procedure: TRANSESOPHAGEAL ECHOCARDIOGRAM (TEE);  Surgeon: Sanda Klein, MD;  Location: [facility] ENDOSCOPY;  Service: Cardiovascular;  Laterality: N/A;  h&p in file-[place]  . TEE WITHOUT CARDIOVERSION N/A 08/09/2013   Procedure: TRANSESOPHAGEAL ECHOCARDIOGRAM (TEE);  Surgeon: Lelon Perla, MD;  Location: [facility] ENDOSCOPY;  Service: Cardiovascular;  Laterality: N/A;  . TOTAL ABDOMINAL HYSTERECTOMY  1981     Home Medications:  Prior to Admission medications   Medication Sig Start Date End Date Taking? Authorizing Provider  Cholecalciferol (VITAMIN D3) 1000 UNITS CAPS Take 1 capsule by mouth daily.    Yes [provider]  Cyanocobalamin (VITAMIN B12 PO) Take 1 tablet by mouth daily as needed (for energy).    Yes [provider]  DILT-XR 180 MG 24 hr capsule TAKE 1 CAPSULE BY MOUTH ONCE DAILY Patient taking differently: Take 180 mg by mouth daily.  01/06/18  Yes Heiley Shaikh, MD  furosemide (LASIX) 40 MG tablet Take 1 tablet (40 mg total) by mouth daily. 07/24/17  Yes Janeil Schexnayder, MD  loperamide (IMODIUM A-D) 2 MG tablet Take 2 mg by mouth as needed for diarrhea or loose stools.    Yes [provider]  losartan (COZAAR) 25 MG tablet Take 25 mg by mouth daily.  05/22/17  Yes [provider]  metoprolol tartrate (LOPRESSOR) 50 MG tablet Take 1 tablet (50 mg total) by mouth 2 (two) times daily. 07/24/17  Yes Dak Szumski,  MD  OVER THE COUNTER MEDICATION Take 1 tablet by mouth daily. Claritin   Yes [provider]  oxybutynin (DITROPAN) 5 MG tablet Take 5 mg by mouth daily.  09/07/15  Yes [provider]  pantoprazole (PROTONIX) 40 MG tablet Take 40 mg by mouth daily.   Yes [provider]  potassium chloride SA (KLOR-CON M20) 20 MEQ tablet Take 1 tablet (20 mEq total) by mouth daily. Patient taking differently: Take 10 mEq by mouth daily.  07/24/17 07/19/18 Yes Jermanie Minshall, MD  PRADAXA 150 MG CAPS capsule TAKE 1 CAPSULE BY MOUTH TWICE DAILY Patient taking differently: Take 150 mg by mouth 2 (two) times daily.  11/18/17  Yes Allred, Jeneen Rinks, MD  propylthiouracil (PTU) 50 MG tablet Take 50-100 mg by mouth See admin instructions. Take 100 mg by mouth in the morning and take 50 mg by mouth at bedtime   Yes [provider]  saxagliptin HCl (ONGLYZA) 5 MG TABS tablet Take 5 mg by mouth daily.   Yes [provider]    Inpatient Medications: Scheduled Meds: . dabigatran  150 mg Oral BID  . diltiazem  180 mg Oral Daily  . furosemide  40 mg Intravenous BID  . insulin aspart  0-9 Units Subcutaneous TID WC  . levofloxacin  250 mg Oral Daily  .  losartan  25 mg Oral Daily  . metoprolol tartrate  50 mg Oral BID  . oxybutynin  5 mg Oral Daily  . pantoprazole  40 mg Oral Daily  . potassium chloride  40 mEq Oral Once  . propylthiouracil  100 mg Oral q morning - 10a  . propylthiouracil  50 mg Oral QHS  . sodium chloride flush  3 mL Intravenous Q12H  . zolpidem  5 mg Oral Once   Continuous Infusions: . sodium chloride     PRN Meds: sodium chloride, acetaminophen, loperamide, ondansetron (ZOFRAN) IV, sodium chloride flush  Allergies:    Allergies  Allergen Reactions  . Codeine Nausea And Vomiting and Nausea Only  . Ketorolac Nausea And Vomiting, Nausea Only and Other (See Comments)    "pt told to never take this med"  . Meperidine Nausea Only  . Demerol Nausea And  Vomiting  . Dronedarone Other (See Comments)    Increased fluid retention  . Cyclobenzaprine Nausea And Vomiting    Social History:   Social History   Socioeconomic History  . Marital status: Married    Spouse name: Hailey [name]  . Number of children: Not on file  . Years of education: Not on file  . Highest education level: Not on file  Occupational History  . Occupation: housewife  Social Needs  . Financial resource strain: Not on file  . Food insecurity:    Worry: Not on file    Inability: Not on file  . Transportation needs:    Medical: Not on file    Non-medical: Not on file  Tobacco Use  . Smoking status: Never Smoker  . Smokeless tobacco: Never Used  Substance and Sexual Activity  . Alcohol use: No  . Drug use: No  . Sexual activity: Not Currently    Birth control/protection: Abstinence  Lifestyle  . Physical activity:    Days per week: Not on file    Minutes per session: Not on file  . Stress: Not on file  Relationships  . Social connections:    Talks on phone: Not on file    Gets together: Not on file    Attends religious service: Not on file    Active member of club or organization: Not on file    Attends meetings of clubs or organizations: Not on file    Relationship status: Not on file  . Intimate partner violence:    Fear of current or ex partner: Not on file    Emotionally abused: Not on file    Physically abused: Not on file    Forced sexual activity: Not on file  Other Topics Concern  . Not on file  Social History Narrative   Lives in [place].  Retired.    Family History:    Family History  Problem Relation Age of Onset  . Heart disease Sister   . Breast cancer Sister   . Breast cancer Sister      ROS:  Please see the history of present illness.   All other ROS reviewed and negative.     Physical Exam/Data:   Vitals:   04/22/18 0507 04/22/18 0828 04/22/18 0903 04/22/18 1043  BP: (!) 98/57 (!) 112/53    Pulse: 70 63    Resp: 19  20    Temp:      TempSrc:      SpO2: 99% 98% (!) 80% 99%  Weight: 59 kg     Height:  Intake/Output Summary (Last 24 hours) at 04/22/2018 1135 Last data filed at 04/21/2018 2159 Gross per 24 hour  Intake 540 ml  Output 900 ml  Net -360 ml   Filed Weights   04/20/18 0445 04/21/18 0438 04/22/18 0507  Weight: 59 kg 59.1 kg 59 kg   Body mass index is 26.27 kg/m.  General:  Well nourished, well developed, in no acute distress HEENT: normal Lymph: no adenopathy Neck: no JVD Endocrine:  No thryomegaly Vascular: No carotid bruits; FA pulses 2+ bilaterally without bruits  Cardiac:  normal S1, S2; RRR; no murmur, no gallop; the left subclavian pacemaker site Lungs: Few coarse breath sounds/rhonchi heard in the lung base, no overt moist rales, no wheezing, rhonchi or rales  Abd: soft, nontender, no hepatomegaly  Ext: no edema Musculoskeletal:  No deformities, BUE and BLE strength normal and equal Skin: warm and dry  Neuro:  CNs 2-12 intact, no focal abnormalities noted Psych:  Normal affect   EKG:  The EKG was personally reviewed and demonstrates: Sinus rhythm/atrial paced beats, native AV conduction with narrow QRS but minor IVCD RSR prime in lead V1; nonspecific repolarization abnormalities are seen Telemetry:  Telemetry was personally reviewed and demonstrates: Atrial fibrillation with controlled ventricular response and intermittent ventricular pacing  Relevant CV Studies:  CATH 07/30/2017:  Ost 1st Diag to 1st Diag lesion is 10% stenosed.  Mid RCA lesion is 20% stenosed.  The left ventricular systolic function is normal.  LV end diastolic pressure is normal.  No significant coronary obstructive disease with mild smooth 10% narrowing in the proximal to midportion of the first diagonal branch of the LAD; normal left circumflex coronary artery; and smooth 20% mid RCA narrowing.  Normal LV function without focal segmental wall motion abnormalities. LVEDP 7.  Echo  04/20/2018 - Left ventricle: The cavity size was normal. Systolic function was   normal. The estimated ejection fraction was in the range of 55%   to 60%. Wall motion was normal; there were no regional wall   motion abnormalities. There was a reduced contribution of atrial   contraction to ventricular filling, due to increased ventricular   diastolic pressure or atrial contractile dysfunction. Features   are consistent with a pseudonormal left ventricular filling   pattern, with concomitant abnormal relaxation and increased   filling pressure (grade 2 diastolic dysfunction). - Left atrium: The atrium was mildly dilated. - Right ventricle: The cavity size was mildly dilated. Wall   thickness was normal. - Right atrium: The atrium was mildly dilated. - Tricuspid valve: There was mild-moderate regurgitation directed   centrally. - Pulmonary arteries: Systolic pressure was moderately to severely   increased. PA peak pressure: 66 mm Hg (S).  Impressions:  - PA pressure has increased since the previous study. Recommend   repeat limited echo after diuresis to reassess the PA pressure   (can be performed during outpatient follow up).  Laboratory Data:  Chemistry Recent Labs  Lab 04/20/18 0443 04/21/18 0335 04/22/18 0302  NA 139 139 139  K 3.4* 3.7 3.1*  CL 104 101 100  CO2 28 30 30   GLUCOSE 116* 100* 119*  BUN 8 8 10   CREATININE 0.90 1.00 0.90  CALCIUM 8.5* 8.8* 8.7*  GFRNONAA >60 55* >60  GFRAA >60 >60 >60  ANIONGAP 7 8 9     Recent Labs  Lab 04/19/18 1140  PROT 6.7  ALBUMIN 3.9  AST 22  ALT 22  ALKPHOS 81  BILITOT 1.1   Hematology Recent Labs  Lab 04/19/18 1140 04/22/18 0302  WBC 7.2 5.8  RBC 4.39 4.14  HGB 10.1* 9.4*  HCT 35.1* 32.7*  MCV 80.0 79.0*  MCH 23.0* 22.7*  MCHC 28.8* 28.7*  RDW 16.2* 15.8*  PLT 286 273   Cardiac Enzymes Recent Labs  Lab 04/19/18 1140 04/19/18 1758 04/19/18 2316 04/20/18 0443  TROPONINI <0.03 <0.03 <0.03 <0.03   No  results for input(s): TROPIPOC in the last 168 hours.  BNP Recent Labs  Lab 04/19/18 1140  BNP 393.4*    DDimer No results for input(s): DDIMER in the last 168 hours.  Radiology/Studies:  Dg Chest 2 View  Result Date: 04/19/2018 CLINICAL DATA:  Nausea and shortness of breath for several hours EXAM: CHEST - 2 VIEW COMPARISON:  09/11/2017 FINDINGS: Cardiac shadow is enlarged but stable. Defibrillator is again seen. Patchy infiltrate is noted in the medial right lung base with chronic blunting of the costophrenic angle. Stable scarring in the left base is seen as well. No other focal abnormality is noted. IMPRESSION: Chronic changes in the bases bilaterally with some new increased right medial infiltrate. Electronically Signed   By: Inez Catalina M.D.   On: 04/19/2018 11:43    Assessment and Plan:   1. CHF: Preserved left ventricular ejection fraction.  Has had diastolic heart failure in the past, but has been well compensated for many years.  As far as I can tell from a clinical standpoint, she is now euvolemic.  She has diuresed 1.5 L since admission.  Her weight is down roughly 5 pounds since admission.  Her BNP at the onset of illness is only modestly elevated at 393.  Not sure that continue treatment with diuretics will fix her hypoxemia.  Note that when she underwent cardiac catheterization in February 2019 the LVEDP was low at 7 mmHg. 2. PAH: The degree of pulmonary artery hypertension is unexpected and substantially worsened compared to her previous echocardiogram in 2017.  It is possible that it is not entirely related to left heart failure and then she may have some intrinsic pulmonary arteriolar disease.  This could also explain the marked hypoxemia which is out of proportion to the abnormalities on chest x-ray and the absence of marked findings of hypervolemia.  I think it would be a good idea to perform a right heart catheterization and directly measure left heart filling pressures and  confirmed the echo finding of moderate to severe pulmonary hypertension.  She has received anticoagulants already today.  If we can obtain access for right heart catheterization with a large gauge antecubital IV, we could go ahead and do it today.  If we need to perform her catheterization from femoral vein approach we will hold her Pradaxa for 24 hours first. This procedure has been fully reviewed with the patient and written informed consent has been obtained.  I discussed with her daughter Hailey [name], who is a Marine scientist. 3. AFib: Normal rhythm on admission, but in atrial fibrillation today.  As on previous occasions she is completely oblivious to the arrhythmia.  From previous pacemaker interrogation it is clear that she frequently has bouts of paroxysmal A. fib for an overall burden of about 14%.  Rate control has never been an issue.  I do not think the arrhythmia is in any way causing her current decompensation.  She has been compliant with anticoagulation for years.  Pulmonary embolism would therefore be an unlikely explanation for pulmonary artery hypertension and/or hypoxemia. 4. CAD: Angina has not been a complaint.  She  had single-vessel moderate disease (70% mid RCA stenosis) by cardiac catheterization in 2012, felt to be even less significant catheterization at cath February 2019. On statin. 5. PPM:  normal device function by an office check in July and remote download in August.  She did have a period of decompensation when she lost ventricular capture about a year ago, resolved by reprogramming her ventricular lead in unipolar pacing mode.  No evidence of loss of ventricular capture during telemetry monitoring in the hospital on this admission.  She is not device dependent.  6. SSS: When not in atrial fibrillation paces the atrium almost 100% of the time.   7. 2nd deg AVB: She has occasional periods of AV block, but only requires 20% ventricular pacing.  She was very symptomatic with loss of ventricular  capture earlier this year.      For questions or updates, please contact [place] Please consult [email or web address] for contact info under     Signed, Sanda Klein, MD  04/22/2018 11:35 AM

## 2018-04-22 NOTE — Interval H&P Note (Signed)
History and Physical Interval Note:  04/22/2018 4:16 PM  Hailey [name]  has presented today for surgery, with the diagnosis of hp  The various methods of treatment have been discussed with the patient and family. After consideration of risks, benefits and other options for treatment, the patient has consented to  Procedure(s): RIGHT HEART CATH (N/A) as a surgical intervention .  The patient's history has been reviewed, patient examined, no change in status, stable for surgery.  I have reviewed the patient's chart and labs.  Questions were answered to the patient's satisfaction.     Kathlyn [place]

## 2018-04-22 NOTE — Progress Notes (Signed)
Notified Dr. Renne Crigler pt desating to 80-83% while ambulating on RA. Took 4L ambulating to get sats to 90's. Pt doing well on RA while sitting. Cont to monitor. Carroll Kinds RN

## 2018-04-23 ENCOUNTER — Encounter (HOSPITAL_COMMUNITY): Payer: Self-pay | Admitting: Cardiovascular Disease

## 2018-04-23 ENCOUNTER — Inpatient Hospital Stay (HOSPITAL_COMMUNITY): Payer: Medicare Other

## 2018-04-23 DIAGNOSIS — I495 Sick sinus syndrome: Secondary | ICD-10-CM

## 2018-04-23 LAB — CBC
HCT: 34 % — ABNORMAL LOW (ref 36.0–46.0)
Hemoglobin: 10 g/dL — ABNORMAL LOW (ref 12.0–15.0)
MCH: 23.1 pg — ABNORMAL LOW (ref 26.0–34.0)
MCHC: 29.4 g/dL — AB (ref 30.0–36.0)
MCV: 78.7 fL — ABNORMAL LOW (ref 80.0–100.0)
Platelets: 283 10*3/uL (ref 150–400)
RBC: 4.32 MIL/uL (ref 3.87–5.11)
RDW: 15.9 % — AB (ref 11.5–15.5)
WBC: 5.8 10*3/uL (ref 4.0–10.5)
nRBC: 0 % (ref 0.0–0.2)

## 2018-04-23 LAB — BASIC METABOLIC PANEL
Anion gap: 8 (ref 5–15)
BUN: 11 mg/dL (ref 8–23)
CO2: 29 mmol/L (ref 22–32)
CREATININE: 0.92 mg/dL (ref 0.44–1.00)
Calcium: 8.8 mg/dL — ABNORMAL LOW (ref 8.9–10.3)
Chloride: 101 mmol/L (ref 98–111)
GFR calc Af Amer: >60 mL/min (ref >60)
Glucose, Bld: 101 mg/dL — ABNORMAL HIGH (ref 70–99)
POTASSIUM: 3.4 mmol/L — AB (ref 3.5–5.1)
SODIUM: 138 mmol/L (ref 135–145)

## 2018-04-23 LAB — GLUCOSE, CAPILLARY
GLUCOSE-CAPILLARY: 115 mg/dL — AB (ref 70–99)
Glucose-Capillary: 113 mg/dL — ABNORMAL HIGH (ref 70–99)
Glucose-Capillary: 84 mg/dL (ref 70–99)
Glucose-Capillary: 86 mg/dL (ref 70–99)

## 2018-04-23 MED ORDER — IOHEXOL 300 MG/ML  SOLN
75.0000 mL | Freq: Once | INTRAMUSCULAR | Status: AC | PRN
  Administered 2018-04-23: 75 mL via INTRAVENOUS

## 2018-04-23 MED ORDER — SENNOSIDES-DOCUSATE SODIUM 8.6-50 MG PO TABS
1.0000 | ORAL_TABLET | Freq: Two times a day (BID) | ORAL | Status: DC
  Administered 2018-04-23 – 2018-04-25 (×3): 1 via ORAL
  Filled 2018-04-23 (×4): qty 1

## 2018-04-23 MED ORDER — POTASSIUM CHLORIDE CRYS ER 20 MEQ PO TBCR
40.0000 meq | EXTENDED_RELEASE_TABLET | Freq: Once | ORAL | Status: AC
  Administered 2018-04-23: 40 meq via ORAL
  Filled 2018-04-23: qty 2

## 2018-04-23 MED ORDER — IOPAMIDOL (ISOVUE-300) INJECTION 61%: 75.0000 mL | Freq: Once | INTRAVENOUS | Status: DC | PRN

## 2018-04-23 NOTE — Progress Notes (Signed)
Progress Note  Patient Name: Hailey [name] Date of Encounter: 04/23/2018  Primary Cardiologist: Sanda Klein, MD   Subjective   She is breathing better.  Still on 2 L oxygen at rest. Yesterday's right heart catheterization showed pulmonary artery wedge pressure of 19, with persistently elevated systolic PA pressure of 82/XHBZ 43 mmHg.  His pulmonary gradient 24 mmHg, PVR almost 5 Woods units.  Preserved cardiac index almost 3 L/minute/meters squared. She reports that she had pulmonary function tests approximately 1 year ago and Dr. Julianne Rice office when she was complaining of coughing every time she bent over.  She reports that the PFTs were normal. If these have been ordered on this hospitalization, but not yet performed.  Also waiting for CT chest.  Inpatient Medications    Scheduled Meds: . dabigatran  150 mg Oral BID  . diltiazem  180 mg Oral Daily  . furosemide  40 mg Oral Daily  . insulin aspart  0-9 Units Subcutaneous TID WC  . levofloxacin  250 mg Oral Daily  . losartan  25 mg Oral Daily  . metoprolol tartrate  50 mg Oral BID  . oxybutynin  5 mg Oral Daily  . pantoprazole  40 mg Oral Daily  . propylthiouracil  100 mg Oral q morning - 10a  . propylthiouracil  50 mg Oral QHS  . sodium chloride flush  3 mL Intravenous Q12H  . zolpidem  5 mg Oral Once   Continuous Infusions: . sodium chloride     PRN Meds: sodium chloride, acetaminophen, guaiFENesin-dextromethorphan, loperamide, ondansetron (ZOFRAN) IV, sodium chloride flush   Vital Signs    Vitals:   04/22/18 2129 04/23/18 0157 04/23/18 0606 04/23/18 0935  BP: (!) 104/54 (!) 117/56 (!) 108/56 (!) 104/52  Pulse: 69  70   Resp:  (!) 21    Temp: 98.3 F (36.8 C)  98.2 F (36.8 C)   TempSrc: Oral  Oral   SpO2: 97% 96% 97%   Weight:   57.2 kg   Height:        Intake/Output Summary (Last 24 hours) at 04/23/2018 0939 Last data filed at 04/23/2018 0842 Gross per 24 hour  Intake 600 ml  Output 800 ml  Net -200  ml   Filed Weights   04/21/18 0438 04/22/18 0507 04/23/18 0606  Weight: 59.1 kg 59 kg 57.2 kg    Telemetry    Atrial fibrillation with ventricular paced rhythm- Personally Reviewed  ECG    No new tracing- Personally Reviewed  Physical Exam  Appears comfortable GEN: No acute distress.   Neck: No JVD Cardiac: RRR, widely split S2, no murmurs, rubs, or gallops.  The pacemaker site there is healthy Respiratory: Clear to auscultation bilaterally. GI: Soft, nontender, non-distended  MS: No edema; No deformity. Neuro:  Nonfocal  Psych: Normal affect   Labs    Chemistry Recent Labs  Lab 04/19/18 1140  04/21/18 0335 04/22/18 0302 04/23/18 0410  NA 137   < > 139 139 138  K 3.3*   < > 3.7 3.1* 3.4*  CL 102   < > 101 100 101  CO2 24   < > 30 30 29   GLUCOSE 128*   < > 100* 119* 101*  BUN 10   < > 8 10 11   CREATININE 0.60   < > 1.00 0.90 0.92  CALCIUM 8.9   < > 8.8* 8.7* 8.8*  PROT 6.7  --   --   --   --   ALBUMIN  3.9  --   --   --   --   AST 22  --   --   --   --   ALT 22  --   --   --   --   ALKPHOS 81  --   --   --   --   BILITOT 1.1  --   --   --   --   GFRNONAA >60   < > 55* >60 >60  GFRAA >60   < > >60 >60 >60  ANIONGAP 11   < > 8 9 8    < > = values in this interval not displayed.     Hematology Recent Labs  Lab 04/19/18 1140 04/22/18 0302 04/23/18 0410  WBC 7.2 5.8 5.8  RBC 4.39 4.14 4.32  HGB 10.1* 9.4* 10.0*  HCT 35.1* 32.7* 34.0*  MCV 80.0 79.0* 78.7*  MCH 23.0* 22.7* 23.1*  MCHC 28.8* 28.7* 29.4*  RDW 16.2* 15.8* 15.9*  PLT 286 273 283    Cardiac Enzymes Recent Labs  Lab 04/19/18 1140 04/19/18 1758 04/19/18 2316 04/20/18 0443  TROPONINI <0.03 <0.03 <0.03 <0.03   No results for input(s): TROPIPOC in the last 168 hours.   BNP Recent Labs  Lab 04/19/18 1140  BNP 393.4*     DDimer No results for input(s): DDIMER in the last 168 hours.   Radiology    No results found.  Cardiac Studies  Right heart catheterization April 22, 2018 1.  Mildly elevated left-sided filling pressures. 2.  Moderate to severe pulmonary hypertension with normal cardiac output.  Pulmonary capillary wedge pressure: 19 mmHg. PA pressure: 68/28 with a mean of 43 mmHg. Pulmonary vascular resistance: 4.9 Woods units Cardiac index was 2.97.  Recommendations: The patient seems to have mixed pulmonary hypertension due to left-sided heart failure and primary pulmonary hypertension.  She is mildly volume overloaded and I suspect that she can switch to oral furosemide tomorrow. Consider vasodilator therapy for pulmonary hypertension.  Left heart catheterization July 30, 2017   Ost 1st Diag to 1st Diag lesion is 10% stenosed.  Mid RCA lesion is 20% stenosed.  The left ventricular systolic function is normal.  LV end diastolic pressure is normal.   No significant coronary obstructive disease with mild smooth 10% narrowing in the proximal to midportion of the first diagonal branch of the LAD; normal left circumflex coronary artery; and smooth 20% mid RCA narrowing.  Normal LV function without focal segmental wall motion abnormalities.  LVEDP 7.  RECOMMENDATION: Medical therapy.  Echo April 20, 2018   - Left ventricle: The cavity size was normal. Systolic function was   normal. The estimated ejection fraction was in the range of 55%   to 60%. Wall motion was normal; there were no regional wall   motion abnormalities. There was a reduced contribution of atrial   contraction to ventricular filling, due to increased ventricular   diastolic pressure or atrial contractile dysfunction. Features   are consistent with a pseudonormal left ventricular filling   pattern, with concomitant abnormal relaxation and increased   filling pressure (grade 2 diastolic dysfunction). - Left atrium: The atrium was mildly dilated. - Right ventricle: The cavity size was mildly dilated. Wall   thickness was normal. - Right atrium: The atrium was  mildly dilated. - Tricuspid valve: There was mild-moderate regurgitation directed   centrally. - Pulmonary arteries: Systolic pressure was moderately to severely   increased. PA peak pressure: 66 mm Hg (S).  Impressions:  - PA pressure has increased since the previous study.   Patient Profile     72 y.o. female presenting with acute hypoxic respiratory failure in the setting of acute on chronic diastolic heart failure but also newly diagnosed severe pulmonary artery hypertension, background of paroxysmal atrial fibrillation, second-degree AV block status post dual-chamber permanent pacemaker, moderate asymptomatic CAD.  Assessment & Plan    1. CHF: Preserved left ventricular ejection fraction.  Per right heart catheterization findings yesterday she was almost at euvolemia, PAWP 19.  Switched to oral diuretics.  2. PAH: The degree of pulmonary artery hypertension is substantially worsened compared to 2017.    There is a minor component of left heart failure and mostly seems to be secondary to intrinsic pulmonary arteriolar disease.    She does not have known parenchymal lung disease or airway disease, but is scheduled for PFTs hopefully today.  She does not have the typical symptoms of obstructive sleep apnea and is on chronic anticoagulation, which makes chronic thromboembolic disease less likely.  Consider interstitial lung disease.  Have ordered chest CT.  It is also possible that she has primary pulmonary hypertension.  Unless there is an indication of major parenchymal abnormalities on CT or atrial ventilation abnormalities on PFTs, will start empirical therapy with sildenafil 20 mg 3 times daily.  It is paramount to avoid hypoxia and if she remains hypoxemic will have to be discharged with audible home O2.. 3. AFib:  Pacemaker interrogation yesterday shows similar burden of A. fib as in the past around roughly 15%.  She has never been symptomatic with atrial fibrillation.  She is  therapeutically anticoagulated.   4. CAD:  She does not have angina and has had no evidence of disease progression between 2012 and 2019 left heart catheterizations. On statin. 5. PPM:   Apprehensive device check performed yesterday, normal findings. She did have a period of decompensation when she lost ventricular capture about a year ago, resolved by reprogramming her ventricular lead in unipolar pacing mode (not an issue currently).  She is not device dependent.  6. SSS: When not in atrial fibrillation paces the atrium almost 100% of the time.   7. 2nd deg AVB: She has occasional periods of AV block, but only requires 20% ventricular pacing.  She was very symptomatic with loss of ventricular capture earlier this year     For questions or updates, please contact [place] Please consult [email or web address] for contact info under        Signed, Sanda Klein, MD  04/23/2018, 9:39 AM

## 2018-04-23 NOTE — Progress Notes (Signed)
PROGRESS NOTE  Hailey [name] DGL:[id number] DOB: September 28, 1945 DOA: 04/19/2018 PCP: Jani Gravel, MD   LOS: 4 days   Brief Narrative / Interim history: 72 year old female with history of hypertension, diabetes, chronic diastolic CHF, A. fib/sick sinus syndrome with pacemaker, was admitted to the hospital on 11/17 with relatively acute onset of shortness of breath.  She reports a cough for the prior 2 days, weakness, and she was concerned that she would get an upper respiratory infection with sinus congestion and pressure.  Cough has been nonproductive.  Subjective: -Clinically feels better but remains hypoxic with activity  Assessment & Plan: Principal Problem:   Acute on chronic diastolic CHF (congestive heart failure), NYHA class 1 (HCC) Active Problems:   Atrial fibrillation (HCC)   CAD (coronary artery disease)   Second degree AV block   Dyspnea   Hyperthyroidism   Essential hypertension   SSS (sick sinus syndrome) (HCC)   Acute hypoxic respiratory failure due to acute on chronic diastolic CHF/pulmonary hypertension -Patient with evidence of fluid overload with lower extremity edema, pulmonary edema on chest x-ray on admission, diuresed and now euvolemic yet despite that still hypoxic requiring 4 L with activities 2 L at rest -2D echo obtained showed normal EF, grade 2 diastolic dysfunction as well as her PA pressure has increased compared to the prior study.  -She was maintained on IV Lasix for a few days now, her sats are okay at rest however desatted to 81% on ambulating on room air requiring 4 L supplemental oxygen.  -Cardiology was consulted and underwent a right heart cath on 11/20 which showed mildly elevated left-sided filling pressures and moderate to severe pulmonary hypertension with normal cardiac output.  Per cardiology recommendation we will obtain a CT scan of the chest as well as PFTs, may benefit from sildenafil, studies are pending  Lobar pneumonia -Chest x-ray with  infiltrate in the medial right lung base, she did have URI type symptoms as well as cough and may be indicative of early pneumonia.  Would favor to treat with Levaquin for a few days. -Today day 4/5, can discontinue tomorrow  Chronic A. fib -Now rate controlled with PPM, continue Pradaxa for anticoagulation  Diabetes mellitus -Continue sliding scale, CBGs 80s this morning  Sick sinus syndrome -Pacemaker in place  Hypertension -Blood pressure within normal parameters, keep on same regimen   Scheduled Meds: . dabigatran  150 mg Oral BID  . diltiazem  180 mg Oral Daily  . furosemide  40 mg Oral Daily  . insulin aspart  0-9 Units Subcutaneous TID WC  . levofloxacin  250 mg Oral Daily  . losartan  25 mg Oral Daily  . metoprolol tartrate  50 mg Oral BID  . oxybutynin  5 mg Oral Daily  . pantoprazole  40 mg Oral Daily  . propylthiouracil  100 mg Oral q morning - 10a  . propylthiouracil  50 mg Oral QHS  . senna-docusate  1 tablet Oral BID  . sodium chloride flush  3 mL Intravenous Q12H  . zolpidem  5 mg Oral Once   Continuous Infusions: . sodium chloride     PRN Meds:.sodium chloride, acetaminophen, guaiFENesin-dextromethorphan, iopamidol, loperamide, ondansetron (ZOFRAN) IV, sodium chloride flush  DVT prophylaxis: She is on Pradaxa Code Status: Full code Family Communication: No family present at bedside Disposition Plan: Home when ready  Consultants:   Cardiology  Procedures:   Right cath Right heart catheterization April 22, 2018 1. Mildly elevated left-sided filling pressures. 2. Moderate to severe  pulmonary hypertension with normal cardiac output.  Pulmonary capillary wedge pressure: 19 mmHg. PA pressure: 68/28 with a mean of 43 mmHg. Pulmonary vascular resistance: 4.9 Woods units Cardiac index was 2.97.  Recommendations: The patient seems to have mixed pulmonary hypertension due to left-sided heart failure and primary pulmonary hypertension. She is  mildly volume overloaded and I suspect that she can switch to oral furosemide tomorrow. Consider vasodilator therapy for pulmonary hypertension.   2D echo:  Study Conclusions  - Left ventricle: The cavity size was normal. Systolic function was   normal. The estimated ejection fraction was in the range of 55% to 60%. Wall motion was normal; there were no regional wall motion abnormalities. There was a reduced contribution of atrial contraction to ventricular filling, due to increased ventricular diastolic pressure or atrial contractile dysfunction. Features are consistent with a pseudonormal left ventricular filling pattern, with concomitant abnormal relaxation and increased filling pressure (grade 2 diastolic dysfunction). - Left atrium: The atrium was mildly dilated. - Right ventricle: The cavity size was mildly dilated. Wall thickness was normal. - Right atrium: The atrium was mildly dilated. - Tricuspid valve: There was mild-moderate regurgitation directed centrally. - Pulmonary arteries: Systolic pressure was moderately to severely increased. PA peak pressure: 66 mm Hg (S).  Impressions:  - PA pressure has increased since the previous study. Recommend repeat limited echo after diuresis to reassess the PA pressure (can be performed during outpatient follow up).  Antimicrobials:  Levaquin 11/18 >>   Objective: Vitals:   04/23/18 0157 04/23/18 0606 04/23/18 0935 04/23/18 1420  BP: (!) 117/56 (!) 108/56 (!) 104/52 (!) 98/57  Pulse:  70  70  Resp: (!) 21   (!) 22  Temp:  98.2 F (36.8 C)  97.6 F (36.4 C)  TempSrc:  Oral  Oral  SpO2: 96% 97%  100%  Weight:  57.2 kg    Height:        Intake/Output Summary (Last 24 hours) at 04/23/2018 1651 Last data filed at 04/23/2018 1100 Gross per 24 hour  Intake 600 ml  Output 800 ml  Net -200 ml   Filed Weights   04/21/18 0438 04/22/18 0507 04/23/18 0606  Weight: 59.1 kg 59 kg 57.2 kg    Examination:  Constitutional:  NAD Eyes: no icterus ENMT: mmm Respiratory: CTA biL without wheezing or crackles Cardiovascular: RRR, no murmurs, trace edema Abdomen: Soft, NT, ND, positive bowel sounds Skin: No rashes seen Neurologic: No focal deficits  Data Reviewed: I have independently reviewed following labs and imaging studies   CBC: Recent Labs  Lab 04/19/18 1140 04/22/18 0302 04/23/18 0410  WBC 7.2 5.8 5.8  NEUTROABS 5.7  --   --   HGB 10.1* 9.4* 10.0*  HCT 35.1* 32.7* 34.0*  MCV 80.0 79.0* 78.7*  PLT 286 273 426   Basic Metabolic Panel: Recent Labs  Lab 04/19/18 1140 04/20/18 0443 04/21/18 0335 04/22/18 0302 04/23/18 0410  NA 137 139 139 139 138  K 3.3* 3.4* 3.7 3.1* 3.4*  CL 102 104 101 100 101  CO2 24 28 30 30 29   GLUCOSE 128* 116* 100* 119* 101*  BUN 10 8 8 10 11   CREATININE 0.60 0.90 1.00 0.90 0.92  CALCIUM 8.9 8.5* 8.8* 8.7* 8.8*   GFR: Estimated Creatinine Clearance: 42 mL/min (by C-G formula based on SCr of 0.92 mg/dL). Liver Function Tests: Recent Labs  Lab 04/19/18 1140  AST 22  ALT 22  ALKPHOS 81  BILITOT 1.1  PROT 6.7  ALBUMIN 3.9  No results for input(s): LIPASE, AMYLASE in the last 168 hours. No results for input(s): AMMONIA in the last 168 hours. Coagulation Profile: No results for input(s): INR, PROTIME in the last 168 hours. Cardiac Enzymes: Recent Labs  Lab 04/19/18 1140 04/19/18 1758 04/19/18 2316 04/20/18 0443  TROPONINI <0.03 <0.03 <0.03 <0.03   BNP (last 3 results) No results for input(s): PROBNP in the last 8760 hours. HbA1C: No results for input(s): HGBA1C in the last 72 hours. CBG: Recent Labs  Lab 04/22/18 1714 04/22/18 2127 04/23/18 0731 04/23/18 1137 04/23/18 1619  GLUCAP 99 157* 113* 84 86   Lipid Profile: No results for input(s): CHOL, HDL, LDLCALC, TRIG, CHOLHDL, LDLDIRECT in the last 72 hours. Thyroid Function Tests: No results for input(s): TSH, T4TOTAL, FREET4, T3FREE, THYROIDAB in the last 72 hours. Anemia Panel: No  results for input(s): VITAMINB12, FOLATE, FERRITIN, TIBC, IRON, RETICCTPCT in the last 72 hours. Urine analysis:    Component Value Date/Time   COLORURINE YELLOW 04/19/2018 [place] 04/19/2018 1214   LABSPEC 1.010 04/19/2018 1214   PHURINE 6.5 04/19/2018 [place] 04/19/2018 1214   HGBUR NEGATIVE 04/19/2018 1214   BILIRUBINUR NEGATIVE 04/19/2018 1214   KETONESUR NEGATIVE 04/19/2018 1214   PROTEINUR NEGATIVE 04/19/2018 1214   UROBILINOGEN 0.2 02/26/2017 1930   NITRITE NEGATIVE 04/19/2018 1214   LEUKOCYTESUR NEGATIVE 04/19/2018 1214   Sepsis Labs: Invalid input(s): PROCALCITONIN, LACTICIDVEN  No results found for this or any previous visit (from the past 240 hour(s)).   Radiology Studies: No results found.  Marzetta Board, MD, PhD [facility] Pager [phone]  If 7PM-7AM, please contact night-coverage [email or web address] Password [facility] 04/23/2018, 4:51 PM

## 2018-04-24 ENCOUNTER — Inpatient Hospital Stay (HOSPITAL_COMMUNITY): Payer: Medicare Other

## 2018-04-24 LAB — GLUCOSE, CAPILLARY
GLUCOSE-CAPILLARY: 115 mg/dL — AB (ref 70–99)
Glucose-Capillary: 100 mg/dL — ABNORMAL HIGH (ref 70–99)
Glucose-Capillary: 92 mg/dL (ref 70–99)

## 2018-04-24 LAB — PULMONARY FUNCTION TEST
DL/VA % PRED: 87 %
DL/VA: 3.57 ml/min/mmHg/L
DLCO COR: 8.91 ml/min/mmHg
DLCO cor % pred: 50 %
DLCO unc % pred: 44 %
DLCO unc: 7.74 ml/min/mmHg
FEF 25-75 PRE: 0.92 L/s
FEF2575-%PRED-PRE: 61 %
FEV1-%Pred-Pre: 64 %
FEV1-PRE: 1.1 L
FEV1FVC-%Pred-Pre: 101 %
FEV6-%PRED-PRE: 65 %
FEV6-PRE: 1.44 L
FEV6FVC-%Pred-Pre: 105 %
FVC-%PRED-PRE: 62 %
FVC-Pre: 1.44 L
Pre FEV1/FVC ratio: 77 %
Pre FEV6/FVC Ratio: 100 %
RV % pred: 98 %
RV: 1.97 L
TLC % pred: 79 %
TLC: 3.42 L

## 2018-04-24 LAB — CBC
HEMATOCRIT: 33.9 % — AB (ref 36.0–46.0)
Hemoglobin: 9.8 g/dL — ABNORMAL LOW (ref 12.0–15.0)
MCH: 22.9 pg — ABNORMAL LOW (ref 26.0–34.0)
MCHC: 28.9 g/dL — ABNORMAL LOW (ref 30.0–36.0)
MCV: 79.2 fL — AB (ref 80.0–100.0)
NRBC: 0 % (ref 0.0–0.2)
Platelets: 269 10*3/uL (ref 150–400)
RBC: 4.28 MIL/uL (ref 3.87–5.11)
RDW: 15.9 % — AB (ref 11.5–15.5)
WBC: 4.8 10*3/uL (ref 4.0–10.5)

## 2018-04-24 LAB — BASIC METABOLIC PANEL
ANION GAP: 6 (ref 5–15)
BUN: 12 mg/dL (ref 8–23)
CHLORIDE: 104 mmol/L (ref 98–111)
CO2: 29 mmol/L (ref 22–32)
Calcium: 9 mg/dL (ref 8.9–10.3)
Creatinine, Ser: 0.89 mg/dL (ref 0.44–1.00)
GFR calc Af Amer: >60 mL/min (ref >60)
GFR calc non Af Amer: >60 mL/min (ref >60)
Glucose, Bld: 103 mg/dL — ABNORMAL HIGH (ref 70–99)
Potassium: 3.9 mmol/L (ref 3.5–5.1)
SODIUM: 139 mmol/L (ref 135–145)

## 2018-04-24 MED ORDER — SILDENAFIL CITRATE 20 MG PO TABS: 20.0000 mg | ORAL_TABLET | Freq: Three times a day (TID) | ORAL | Status: DC

## 2018-04-24 MED ORDER — TORSEMIDE 20 MG PO TABS
20.0000 mg | ORAL_TABLET | Freq: Every day | ORAL | Status: DC
  Administered 2018-04-24 – 2018-04-25 (×2): 20 mg via ORAL
  Filled 2018-04-24 (×2): qty 1

## 2018-04-24 NOTE — Progress Notes (Signed)
Repeated evaluation of  patient  Oxygen saturation on room air at rest and during ambulation off O2  .Done as per request from [place] . O2 saturation at rest at 98 %. During ambulation off O2 her oxygen saturation had only dropped to 89 %. CM Hassan Rowan RN  Updated.

## 2018-04-24 NOTE — Progress Notes (Signed)
PROGRESS NOTE  Hailey [name] EXH:[id number] DOB: 01-01-1946 DOA: 04/19/2018 PCP: Jani Gravel, MD   LOS: 5 days   Brief Narrative / Interim history: 72 year old female with history of hypertension, diabetes, chronic diastolic CHF, A. fib/sick sinus syndrome with pacemaker, was admitted to the hospital on 11/17 with relatively acute onset of shortness of breath.  She reports a cough for the prior 2 days, weakness, and she was concerned that she would get an upper respiratory infection with sinus congestion and pressure.  Cough has been nonproductive.  Subjective: Denies any new complaints, denies any worsening shortness of breath, chest pain, fever/chills  Assessment & Plan: Principal Problem:   Acute on chronic diastolic CHF (congestive heart failure), NYHA class 1 (HCC) Active Problems:   Atrial fibrillation (HCC)   CAD (coronary artery disease)   Second degree AV block   Dyspnea   Hyperthyroidism   Essential hypertension   SSS (sick sinus syndrome) (HCC)   Acute hypoxic respiratory failure due to acute on chronic diastolic CHF/pulmonary hypertension -Patient with evidence of fluid overload with lower extremity edema, pulmonary edema on chest x-ray on admission, diuresed and now euvolemic yet despite that still hypoxic requiring 4 L with activities 2 L at rest -2D echo obtained showed normal EF, grade 2 diastolic dysfunction as well as her PA pressure has increased compared to the prior study.  -Cardiology was consulted and underwent a right heart cath on 11/20 which showed mildly elevated left-sided filling pressures and moderate to severe pulmonary hypertension with normal cardiac output.  Per cardiology recommendation we will obtain a CT scan of the chest which was negative for any lung fibrosis -Pulmonary consulted, appreciate recs  Lobar pneumonia -Chest x-ray with infiltrate in the medial right lung base, she did have URI type symptoms as well as cough and may be indicative of  early pneumonia.  Would favor to treat with Levaquin for 5 days.  Chronic A. fib -Now rate controlled with PPM, continue Pradaxa for anticoagulation  Diabetes mellitus -Continue sliding scale, accuchecks  Sick sinus syndrome -Pacemaker in place  Hypertension Blood pressure within normal parameters, keep on same regimen  Normocytic anemia Unknown etiology, at baseline    Scheduled Meds: . dabigatran  150 mg Oral BID  . diltiazem  180 mg Oral Daily  . insulin aspart  0-9 Units Subcutaneous TID WC  . losartan  25 mg Oral Daily  . metoprolol tartrate  50 mg Oral BID  . oxybutynin  5 mg Oral Daily  . pantoprazole  40 mg Oral Daily  . propylthiouracil  100 mg Oral q morning - 10a  . propylthiouracil  50 mg Oral QHS  . senna-docusate  1 tablet Oral BID  . sodium chloride flush  3 mL Intravenous Q12H  . torsemide  20 mg Oral Daily  . zolpidem  5 mg Oral Once   Continuous Infusions: . sodium chloride     PRN Meds:.sodium chloride, acetaminophen, guaiFENesin-dextromethorphan, iopamidol, loperamide, ondansetron (ZOFRAN) IV, sodium chloride flush  DVT prophylaxis: She is on Pradaxa Code Status: Full code Family Communication: No family present at bedside Disposition Plan: Home likely 04/25/18  Consultants:   Cardiology  Pulmonary  Procedures:   Right cath Right heart catheterization April 22, 2018 1. Mildly elevated left-sided filling pressures. 2. Moderate to severe pulmonary hypertension with normal cardiac output.  Pulmonary capillary wedge pressure: 19 mmHg. PA pressure: 68/28 with a mean of 43 mmHg. Pulmonary vascular resistance: 4.9 Woods units Cardiac index was 2.97.  Recommendations: The  patient seems to have mixed pulmonary hypertension due to left-sided heart failure and primary pulmonary hypertension. She is mildly volume overloaded and I suspect that she can switch to oral furosemide tomorrow. Consider vasodilator therapy for pulmonary  hypertension.   2D echo:  Study Conclusions  - Left ventricle: The cavity size was normal. Systolic function was   normal. The estimated ejection fraction was in the range of 55% to 60%. Wall motion was normal; there were no regional wall motion abnormalities. There was a reduced contribution of atrial contraction to ventricular filling, due to increased ventricular diastolic pressure or atrial contractile dysfunction. Features are consistent with a pseudonormal left ventricular filling pattern, with concomitant abnormal relaxation and increased filling pressure (grade 2 diastolic dysfunction). - Left atrium: The atrium was mildly dilated. - Right ventricle: The cavity size was mildly dilated. Wall thickness was normal. - Right atrium: The atrium was mildly dilated. - Tricuspid valve: There was mild-moderate regurgitation directed centrally. - Pulmonary arteries: Systolic pressure was moderately to severely increased. PA peak pressure: 66 mm Hg (S).  Impressions:  - PA pressure has increased since the previous study. Recommend repeat limited echo after diuresis to reassess the PA pressure (can be performed during outpatient follow up).  Antimicrobials:  Levaquin 11/18 >>   Objective: Vitals:   04/24/18 0601 04/24/18 0944 04/24/18 0952 04/24/18 1357  BP: (!) 117/56 125/81  121/70  Pulse: 70  70 86  Resp:    (!) 23  Temp: 98.4 F (36.9 C)     TempSrc: Oral   Oral  SpO2: 99%   93%  Weight: 57.6 kg     Height:        Intake/Output Summary (Last 24 hours) at 04/24/2018 1955 Last data filed at 04/24/2018 1358 Gross per 24 hour  Intake 840 ml  Output 700 ml  Net 140 ml   Filed Weights   04/22/18 0507 04/23/18 0606 04/24/18 0601  Weight: 59 kg 57.2 kg 57.6 kg    Examination:  General: NAD   Cardiovascular: S1, S2 present  Respiratory: CTAB  Abdomen: Soft, nontender, nondistended, bowel sounds present  Musculoskeletal: No bilateral pedal edema noted  Skin:  Normal  Psychiatry: Normal mood   Data Reviewed: I have independently reviewed following labs and imaging studies   CBC: Recent Labs  Lab 04/19/18 1140 04/22/18 0302 04/23/18 0410 04/24/18 0356  WBC 7.2 5.8 5.8 4.8  NEUTROABS 5.7  --   --   --   HGB 10.1* 9.4* 10.0* 9.8*  HCT 35.1* 32.7* 34.0* 33.9*  MCV 80.0 79.0* 78.7* 79.2*  PLT 286 273 283 833   Basic Metabolic Panel: Recent Labs  Lab 04/20/18 0443 04/21/18 0335 04/22/18 0302 04/23/18 0410 04/24/18 0356  NA 139 139 139 138 139  K 3.4* 3.7 3.1* 3.4* 3.9  CL 104 101 100 101 104  CO2 28 30 30 29 29   GLUCOSE 116* 100* 119* 101* 103*  BUN 8 8 10 11 12   CREATININE 0.90 1.00 0.90 0.92 0.89  CALCIUM 8.5* 8.8* 8.7* 8.8* 9.0   GFR: Estimated Creatinine Clearance: 43.5 mL/min (by C-G formula based on SCr of 0.89 mg/dL). Liver Function Tests: Recent Labs  Lab 04/19/18 1140  AST 22  ALT 22  ALKPHOS 81  BILITOT 1.1  PROT 6.7  ALBUMIN 3.9   No results for input(s): LIPASE, AMYLASE in the last 168 hours. No results for input(s): AMMONIA in the last 168 hours. Coagulation Profile: No results for input(s): INR, PROTIME in the  last 168 hours. Cardiac Enzymes: Recent Labs  Lab 04/19/18 1140 04/19/18 1758 04/19/18 2316 04/20/18 0443  TROPONINI <0.03 <0.03 <0.03 <0.03   BNP (last 3 results) No results for input(s): PROBNP in the last 8760 hours. HbA1C: No results for input(s): HGBA1C in the last 72 hours. CBG: Recent Labs  Lab 04/23/18 1137 04/23/18 1619 04/23/18 2156 04/24/18 1053 04/24/18 1631  GLUCAP 84 86 115* 100* 92   Lipid Profile: No results for input(s): CHOL, HDL, LDLCALC, TRIG, CHOLHDL, LDLDIRECT in the last 72 hours. Thyroid Function Tests: No results for input(s): TSH, T4TOTAL, FREET4, T3FREE, THYROIDAB in the last 72 hours. Anemia Panel: No results for input(s): VITAMINB12, FOLATE, FERRITIN, TIBC, IRON, RETICCTPCT in the last 72 hours. Urine analysis:    Component Value Date/Time    COLORURINE YELLOW 04/19/2018 [place] 04/19/2018 1214   LABSPEC 1.010 04/19/2018 1214   PHURINE 6.5 04/19/2018 [place] 04/19/2018 1214   HGBUR NEGATIVE 04/19/2018 1214   BILIRUBINUR NEGATIVE 04/19/2018 1214   KETONESUR NEGATIVE 04/19/2018 1214   PROTEINUR NEGATIVE 04/19/2018 1214   UROBILINOGEN 0.2 02/26/2017 1930   NITRITE NEGATIVE 04/19/2018 1214   LEUKOCYTESUR NEGATIVE 04/19/2018 1214   Sepsis Labs: Invalid input(s): PROCALCITONIN, LACTICIDVEN  No results found for this or any previous visit (from the past 240 hour(s)).   Radiology Studies: Ct Chest W Contrast  Result Date: 04/23/2018 CLINICAL DATA:  Declining oxygen saturations.  Short of breath. EXAM: CT CHEST WITH CONTRAST TECHNIQUE: Multidetector CT imaging of the chest was performed during intravenous contrast administration. CONTRAST:  59mL OMNIPAQUE IOHEXOL 300 MG/ML  SOLN COMPARISON:  Chest radiograph, 04/19/2018 and older exams. FINDINGS: Cardiovascular: Heart is normal in size and configuration. No pericardial effusion. Great vessels are normal in caliber. Mild aortic atherosclerosis. No dissection. Mediastinum/Nodes: Heterogeneous enlarged thyroid gland with several ill-defined nodules. Largest discrete nodule in the right lobe measuring 17 mm. Left lobe larger than the right deviating the trachea to the right. There are prominent, shotty mediastinal lymph nodes, largest a 14 mm short axis subcarinal node. Are also prominent to mildly enlarged bilateral hilar lymph nodes. Trachea is widely patent. Esophagus is unremarkable. Lungs/Pleura: Moderate right and small left pleural effusions. There are multiple nodules. 5 mm nodule, left upper lobe, image 47, series 7. 5 mm nodule, left upper lobe, image 58. 4 mm nodule, left upper lobe, image 77. 7 x 6 mm nodule, left lower lobe, image 78. 6 mm nodule, left lower lobe, image 101. Ill-defined 5 mm nodule, right lower lobe, image 85. 4 mm nodule, right  upper lobe, image 72. There are multiple other nodules, 3 mm in less. Dependent opacity is noted, right greater than left, consistent with atelectasis. There is no evidence of pneumonia or pulmonary edema. No pneumothorax. Upper Abdomen: No acute findings. Status post cholecystectomy. Common bile duct dilated to 1.5 cm with distal tapering. This is presumed chronic. Musculoskeletal: No acute fracture. No osteoblastic or osteolytic lesions. Left anterior chest wall pacemaker leads lie within the right atrium and right ventricle. IMPRESSION: 1. Moderate right and small left pleural effusions with associated dependent atelectasis. No evidence of pneumonia or pulmonary edema. 2. Multiple small pulmonary nodules. Non-contrast chest CT at 3-6 months is recommended. If the nodules are stable at time of repeat CT, then future CT at 18-24 months (from today's scan) is considered optional for low-risk patients, but is recommended for high-risk patients. This recommendation follows the consensus statement: Guidelines for Management of Incidental Pulmonary Nodules Detected  on CT Images: From the Fleischner Society 2017; Radiology 2017; 284:228-243. 3. Enlarged thyroid reflecting a multinodular goiter. Largest discrete nodule measures 1.7 cm. Consider further evaluation with thyroid ultrasound. If patient is clinically hyperthyroid, consider nuclear medicine thyroid uptake and scan. 4. Aortic atherosclerosis. Aortic Atherosclerosis (ICD10-I70.0). Electronically Signed   By: Lajean Manes M.D.   On: 04/23/2018 17:40    Alma Friendly, MD [facility]   If 7PM-7AM, please contact night-coverage [email or web address] 04/24/2018, 7:55 PM

## 2018-04-24 NOTE — Care Management Note (Addendum)
Case Management Note  Patient Details  Name: Hailey [name] MRN: [id number] Date of Birth: 01-11-1946  Subjective/Objective:  Pt presented for SOB-Acute on Chronic CHF-Cardiology & Pulmonary following. PTA from home with husband. Daughters at bedside. Pt has DME [place] in the home.                  Action/Plan: CM did discuss [place] and patient is agreeable to [facility]. Choice offered and patient's husband is active with [facility] and wants to utilize them. CM did make referral with Dian Situ with [facility]. Staff RN to ambulate the patient again to see if qualifies for home oxygen. Drew liaison with [facility] stated that if everything is in Epic ambulatory sat with oxygen orders he can set up 02 via [place]. Family was leaning towards this idea. Weekend CM If qualifies please discuss. No further needs from CM at this time.   Expected Discharge Date:                  Expected Discharge Plan:  [place]  In-House Referral:  NA  Discharge planning Services  CM Consult  Post Acute Care Choice:  Durable Medical Equipment, Home Health Choice offered to:  Patient, Adult Children  DME Arranged:  Oxygen DME Agency:     HH Arranged:  RN, Disease Management [place] Agency:  [place]  Status of Service:  In process, will continue to follow  If discussed at Long Length of Stay Meetings, dates discussed:    Additional Comments: 1545 04-24-18 Jacqlyn Krauss, RN BSN [phone] CM did speak with Staff RN [place] in regards to 02. Patient did not qualify for home 02. RN in the home will need to reassess. No further needs from CM at this time.  Bethena Roys, RN 04/24/2018, 3:25 PM

## 2018-04-24 NOTE — Progress Notes (Signed)
Progress Note  Patient Name: Hailey [name] Date of Encounter: 04/24/2018  Primary Cardiologist: Sanda Klein, MD   Subjective   Feeling a little better. Denies cough, chest discomfort, dyspnea at rest, wheezing, leg edema. Oxygen saturation at rest 92% on room air, drops a little if she speaks.  Not get checked with activity. Weight steady, 127 pounds.  Ins/outs essentially matched for last 24 hours, only 1 L negative since admission. PFTs showed matched reduction in FVC and FEV1 around 60% predicted, corrected DLCO 50% predicted, mostly corrects for alveolar volume. CT with contrast shows bilateral pleural effusions, multiple subcentimeter pulmonary nodules, none of them dominant, no evidence of pneumonia or pulmonary edema, multinodular goiter, expected findings of aortic atherosclerosis.  Inpatient Medications    Scheduled Meds: . dabigatran  150 mg Oral BID  . diltiazem  180 mg Oral Daily  . furosemide  40 mg Oral Daily  . insulin aspart  0-9 Units Subcutaneous TID WC  . levofloxacin  250 mg Oral Daily  . losartan  25 mg Oral Daily  . metoprolol tartrate  50 mg Oral BID  . oxybutynin  5 mg Oral Daily  . pantoprazole  40 mg Oral Daily  . propylthiouracil  100 mg Oral q morning - 10a  . propylthiouracil  50 mg Oral QHS  . senna-docusate  1 tablet Oral BID  . sodium chloride flush  3 mL Intravenous Q12H  . zolpidem  5 mg Oral Once   Continuous Infusions: . sodium chloride     PRN Meds: sodium chloride, acetaminophen, guaiFENesin-dextromethorphan, iopamidol, loperamide, ondansetron (ZOFRAN) IV, sodium chloride flush   Vital Signs    Vitals:   04/24/18 0200 04/24/18 0400 04/24/18 0500 04/24/18 0601  BP:    (!) 117/56  Pulse:    70  Resp:      Temp:    98.4 F (36.9 C)  TempSrc:    Oral  SpO2: 96% 97% 95% 99%  Weight:    57.6 kg  Height:        Intake/Output Summary (Last 24 hours) at 04/24/2018 0917 Last data filed at 04/24/2018 0859 Gross per 24 hour    Intake 840 ml  Output 300 ml  Net 540 ml   Filed Weights   04/22/18 0507 04/23/18 0606 04/24/18 0601  Weight: 59 kg 57.2 kg 57.6 kg    Telemetry    Atrial fibrillation with ventricular pacing- Personally Reviewed  ECG    No new tracings- Personally Reviewed  Physical Exam  Looks comfortable, not tachypneic GEN: No acute distress.   Neck: No JVD Cardiac: RRR, no murmurs, rubs, or gallops.  Respiratory: Clear to auscultation bilaterally. GI: Soft, nontender, non-distended  MS: No edema; No deformity. Neuro:  Nonfocal  Psych: Normal affect   Labs    Chemistry Recent Labs  Lab 04/19/18 1140  04/22/18 0302 04/23/18 0410 04/24/18 0356  NA 137   < > 139 138 139  K 3.3*   < > 3.1* 3.4* 3.9  CL 102   < > 100 101 104  CO2 24   < > 30 29 29   GLUCOSE 128*   < > 119* 101* 103*  BUN 10   < > 10 11 12   CREATININE 0.60   < > 0.90 0.92 0.89  CALCIUM 8.9   < > 8.7* 8.8* 9.0  PROT 6.7  --   --   --   --   ALBUMIN 3.9  --   --   --   --  AST 22  --   --   --   --   ALT 22  --   --   --   --   ALKPHOS 81  --   --   --   --   BILITOT 1.1  --   --   --   --   GFRNONAA >60   < > >60 >60 >60  GFRAA >60   < > >60 >60 >60  ANIONGAP 11   < > 9 8 6    < > = values in this interval not displayed.     Hematology Recent Labs  Lab 04/22/18 0302 04/23/18 0410 04/24/18 0356  WBC 5.8 5.8 4.8  RBC 4.14 4.32 4.28  HGB 9.4* 10.0* 9.8*  HCT 32.7* 34.0* 33.9*  MCV 79.0* 78.7* 79.2*  MCH 22.7* 23.1* 22.9*  MCHC 28.7* 29.4* 28.9*  RDW 15.8* 15.9* 15.9*  PLT 273 283 269    Cardiac Enzymes Recent Labs  Lab 04/19/18 1140 04/19/18 1758 04/19/18 2316 04/20/18 0443  TROPONINI <0.03 <0.03 <0.03 <0.03   No results for input(s): TROPIPOC in the last 168 hours.   BNP Recent Labs  Lab 04/19/18 1140  BNP 393.4*     DDimer No results for input(s): DDIMER in the last 168 hours.   Radiology    Ct Chest W Contrast  Result Date: 04/23/2018 CLINICAL DATA:  Declining oxygen  saturations.  Short of breath. EXAM: CT CHEST WITH CONTRAST TECHNIQUE: Multidetector CT imaging of the chest was performed during intravenous contrast administration. CONTRAST:  41mL OMNIPAQUE IOHEXOL 300 MG/ML  SOLN COMPARISON:  Chest radiograph, 04/19/2018 and older exams. FINDINGS: Cardiovascular: Heart is normal in size and configuration. No pericardial effusion. Great vessels are normal in caliber. Mild aortic atherosclerosis. No dissection. Mediastinum/Nodes: Heterogeneous enlarged thyroid gland with several ill-defined nodules. Largest discrete nodule in the right lobe measuring 17 mm. Left lobe larger than the right deviating the trachea to the right. There are prominent, shotty mediastinal lymph nodes, largest a 14 mm short axis subcarinal node. Are also prominent to mildly enlarged bilateral hilar lymph nodes. Trachea is widely patent. Esophagus is unremarkable. Lungs/Pleura: Moderate right and small left pleural effusions. There are multiple nodules. 5 mm nodule, left upper lobe, image 47, series 7. 5 mm nodule, left upper lobe, image 58. 4 mm nodule, left upper lobe, image 77. 7 x 6 mm nodule, left lower lobe, image 78. 6 mm nodule, left lower lobe, image 101. Ill-defined 5 mm nodule, right lower lobe, image 85. 4 mm nodule, right upper lobe, image 72. There are multiple other nodules, 3 mm in less. Dependent opacity is noted, right greater than left, consistent with atelectasis. There is no evidence of pneumonia or pulmonary edema. No pneumothorax. Upper Abdomen: No acute findings. Status post cholecystectomy. Common bile duct dilated to 1.5 cm with distal tapering. This is presumed chronic. Musculoskeletal: No acute fracture. No osteoblastic or osteolytic lesions. Left anterior chest wall pacemaker leads lie within the right atrium and right ventricle. IMPRESSION: 1. Moderate right and small left pleural effusions with associated dependent atelectasis. No evidence of pneumonia or pulmonary edema. 2.  Multiple small pulmonary nodules. Non-contrast chest CT at 3-6 months is recommended. If the nodules are stable at time of repeat CT, then future CT at 18-24 months (from today's scan) is considered optional for low-risk patients, but is recommended for high-risk patients. This recommendation follows the consensus statement: Guidelines for Management of Incidental Pulmonary Nodules Detected on CT Images: From  the Fleischner Society 2017; Radiology 2017; [phone]. 3. Enlarged thyroid reflecting a multinodular goiter. Largest discrete nodule measures 1.7 cm. Consider further evaluation with thyroid ultrasound. If patient is clinically hyperthyroid, consider nuclear medicine thyroid uptake and scan. 4. Aortic atherosclerosis. Aortic Atherosclerosis (ICD10-I70.0). Electronically Signed   By: Lajean Manes M.D.   On: 04/23/2018 17:40    Cardiac Studies   Right heart catheterization April 22, 2018 1. Mildly elevated left-sided filling pressures. 2. Moderate to severe pulmonary hypertension with normal cardiac output.  Pulmonary capillary wedge pressure: 19 mmHg. PA pressure: 68/28 with a mean of 43 mmHg. Pulmonary vascular resistance: 4.9 Woods units Cardiac index was 2.97.  Recommendations: The patient seems to have mixed pulmonary hypertension due to left-sided heart failure and primary pulmonary hypertension. She is mildly volume overloaded and I suspect that she can switch to oral furosemide tomorrow. Consider vasodilator therapy for pulmonary hypertension.  Left heart catheterization July 30, 2017   Ost 1st Diag to 1st Diag lesion is 10% stenosed.  Mid RCA lesion is 20% stenosed.  The left ventricular systolic function is normal.  LV end diastolic pressure is normal.  No significant coronary obstructive disease with mild smooth 10% narrowing in the proximal to midportion of the first diagonal branch of the LAD; normal left circumflex coronary artery; and smooth 20% mid  RCA narrowing.  Normal LV function without focal segmental wall motion abnormalities. LVEDP 7.  RECOMMENDATION: Medical therapy.  Echo April 20, 2018   - Left ventricle: The cavity size was normal. Systolic function was normal. The estimated ejection fraction was in the range of 55% to 60%. Wall motion was normal; there were no regional wall motion abnormalities. There was a reduced contribution of atrial contraction to ventricular filling, due to increased ventricular diastolic pressure or atrial contractile dysfunction. Features are consistent with a pseudonormal left ventricular filling pattern, with concomitant abnormal relaxation and increased filling pressure (grade 2 diastolic dysfunction). - Left atrium: The atrium was mildly dilated. - Right ventricle: The cavity size was mildly dilated. Wall thickness was normal. - Right atrium: The atrium was mildly dilated. - Tricuspid valve: There was mild-moderate regurgitation directed centrally. - Pulmonary arteries: Systolic pressure was moderately to severely increased. PA peak pressure: 66 mm Hg (S).  Impressions:  - PA pressure has increased since the previous study.   Patient Profile     72 y.o. female presenting with acute hypoxic respiratory failure in the setting of acute on chronic diastolic heart failure but also newly diagnosed severe pulmonary artery hypertension, background of paroxysmal atrial fibrillation, second-degree AV block status post dual-chamber permanent pacemaker, moderate asymptomatic CAD.  Assessment & Plan    1. STM:[id number] left ventricular ejection fraction.  Per right heart catheterization findings 11/20 she was almost at euvolemia, PAWP 19.  Switched to oral diuretics, in/out and weight staying pretty even.  Residual problems with pulmonary hypertension, pleural effusions and hypoxemia cannot be attributed to heart failure. 2. PAH:The degree of pulmonary artery  hypertension is substantially worsened compared to 2017.  There is a minor component of left heart failure and mostly seems to be secondary to intrinsic pulmonary arteriolar disease.  She does not have the typical symptoms of obstructive sleep apnea and is on chronic anticoagulation, which makes chronic thromboembolic disease less likely.   Pending evaluation by pulmonology,  consider  empirical therapy with sildenafil 20 mg 3 times daily.  It is paramount to avoid hypoxia and if she remains hypoxemic will have to be discharged with portable  home O2 she still has exertional hypoxia when checked today.. 3. Possible interstitial lung disease:  PFTs show moderate ventilatory abnormalities, matched FVC/FEV1 and fairly matched diffusion capacity impairment.  CT of chest shows multiple subcentimeter nodules, but no clear evidence of advanced lung fibrosis.  The PFT abnormalities predate the current episode of reported "pneumonia" which is a doubtful diagnosis in the absence of fever and elevated WBC. She has completed treatment with antibiotics.  Asked for a pulmonology consultation.  If they do not think the CT abnormalities and PFT abnormalities truly represent interstitial lung disease and could be a cause for her pulmonary hypertension we will try to start treatment with vasodilators. 4. AFib: Pacemaker interrogation yesterday shows similar burden of A. fib as in the past around roughly 15%.  She has never been symptomatic with atrial fibrillation.  She is therapeutically anticoagulated.   5. CAD: She does not have angina and has had no evidence of disease progression between 2012 and 2019 left heart catheterizations. On statin. 6. PPM: Comprehensive device check performed yesterday, normal findings.She did have aperiodof decompensation when she lost ventricular capture about a year ago, resolved by reprogramming her ventricular lead inunipolar pacing mode (not an issue currently). She is not device  dependent. 7. HUD:JSHF not in atrial fibrillation pacesthe atriumalmost 100% of the time.  8. 2nd deg AVB:She has occasional periods of AV block, but only requires 20% ventricular pacing. She was very symptomatic with loss of ventricular capture earlier this year     For questions or updates, please contact [place] Please consult [email or web address] for contact info under        Signed, Sanda Klein, MD  04/24/2018, 9:17 AM

## 2018-04-24 NOTE — Consult Note (Signed)
NAME:  Hailey [name], MRN:  [id number], DOB:  1945-10-31, LOS: 5 ADMISSION DATE:  04/19/2018, CONSULTATION DATE: 04/24/2018 REFERRING MD:  [facility], CHIEF COMPLAINT: Dyspnea  History of present illness/course in hospital  A 72 year old woman who was admitted to hospital on November 17 following an episode of paroxysmal nocturnal dyspnea.  She improved initially with diuresis but remains dyspneic and desaturates on exertion. Cardiac catheterization showed nonobstructive coronary disease and a pulmonary capillary wedge pressure of 19, pulmonary artery pressures of 68/28 (transpulmonary gradient of 9). A CT of the lungs showed nonspecific nodular disease and bilateral pleural effusions right greater than left.  Given the elevated transpulmonary gradient, a diagnosis of intrinsic pulmonary hypertension was entertained and pulmonary was asked to advise regarding the use of sildenafil.   Past Medical History  Atrial fibrillation and tachybradycardia syndrome requiring pacemaker insertion, heart failure with preserved ejection fraction, hypertension.  She denies known pulmonary disease, prior pulmonary embolism or collagen vascular disease.  She is a lifelong non-smoker and there are no occupational exposures.  Interim history/subjective:  She continues to improve with diuresis and has improved exercise tolerance.  Objective   Blood pressure 121/70, pulse 86, temperature 98.4 F (36.9 C), temperature source Oral, resp. rate (!) 23, height 4\' 11"  (1.499 m), weight 57.6 kg, SpO2 93 %.        Intake/Output Summary (Last 24 hours) at 04/24/2018 1607 Last data filed at 04/24/2018 1358 Gross per 24 hour  Intake 600 ml  Output 700 ml  Net -100 ml   Filed Weights   04/22/18 0507 04/23/18 0606 04/24/18 0601  Weight: 59 kg 57.2 kg 57.6 kg    Examination: General: Appears her stated age.  Well-nourished but somewhat frail.  In no distress. HENT: No scleral icterus.  No  cyanosis.  Multinodular goiter. Lungs: Kyphotic chest.  Normal chest excursion.  Bibasilar crackles which clear with deep breathing. Cardiovascular: JVP is at 3 cm above sternal angle with a positive HJR.  Apex beat is not displaced or sustained.  First heart sound is unremarkable second heart sound is paradoxically split.  S4 gallop present.  No S3.  Short mid systolic murmur 1/6. Abdomen: Soft and nontender with hepatosplenomegaly or masses. Extremities: Bilateral varicose veins with no palpable edema. Neuro: Sensorium is clear.  Speech is fluent.  There are no focal motor deficits. Skin: There are no rashes.  There are no active joints.  Assessment & Plan:  Multifactorial dyspnea due to a combination of:  Diastolic heart failure the context of paroxysmal atrial fibrillation and a paced cardiac rhythm.  Predominantly restrictive pattern on PFTs consistent with chest wall restriction from kyphoscoliosis.  Subacute decline and deconditioning  While the patient does have a mildly elevated transpulmonary gradient, the overall picture consisting of a substrate of atrial fibrillation, paroxysmal nocturnal dyspnea, response to diuresis initially, bilateral pleural effusions suggest patient predominately has partially treated congestive heart failure.  As such, we feel that it would be premature to start the patient on sildenafil.  Rather, we would recommend discharging the patient on oral diuretic, preferably torsemide given its better bioavailability and reassess her in a month's time.  She remains dyspneic after achieving euvolemia we will revisit the issue of pulmonary vasodilators.  She is also deconditioned and will benefit lately from pulmonary rehabilitation.  I have discussed this case with Dr. Lamonte Sakai, who concurs and who will see the patient in follow-up in a month's time.   Labs   CBC: Recent Labs  Lab 04/19/18 1140 04/22/18 0302 04/23/18 0410 04/24/18 0356  WBC 7.2 5.8 5.8 4.8    NEUTROABS 5.7  --   --   --   HGB 10.1* 9.4* 10.0* 9.8*  HCT 35.1* 32.7* 34.0* 33.9*  MCV 80.0 79.0* 78.7* 79.2*  PLT 286 273 283 026    Basic Metabolic Panel: Recent Labs  Lab 04/20/18 0443 04/21/18 0335 04/22/18 0302 04/23/18 0410 04/24/18 0356  NA 139 139 139 138 139  K 3.4* 3.7 3.1* 3.4* 3.9  CL 104 101 100 101 104  CO2 28 30 30 29 29   GLUCOSE 116* 100* 119* 101* 103*  BUN 8 8 10 11 12   CREATININE 0.90 1.00 0.90 0.92 0.89  CALCIUM 8.5* 8.8* 8.7* 8.8* 9.0   GFR: Estimated Creatinine Clearance: 43.5 mL/min (by C-G formula based on SCr of 0.89 mg/dL). Recent Labs  Lab 04/19/18 1140 04/22/18 0302 04/23/18 0410 04/24/18 0356  WBC 7.2 5.8 5.8 4.8    Liver Function Tests: Recent Labs  Lab 04/19/18 1140  AST 22  ALT 22  ALKPHOS 81  BILITOT 1.1  PROT 6.7  ALBUMIN 3.9   No results for input(s): LIPASE, AMYLASE in the last 168 hours. No results for input(s): AMMONIA in the last 168 hours.  ABG    Component Value Date/Time   PHART 7.415 (H) 11/25/2010 1435   PCO2ART 38.8 11/25/2010 1435   PO2ART 67.4 (L) 11/25/2010 1435   HCO3 28.9 (H) 04/22/2018 1650   HCO3 29.8 (H) 04/22/2018 1650   TCO2 30 04/22/2018 1650   TCO2 31 04/22/2018 1650   O2SAT 55.0 04/22/2018 1650   O2SAT 55.0 04/22/2018 1650     Coagulation Profile: No results for input(s): INR, PROTIME in the last 168 hours.  Cardiac Enzymes: Recent Labs  Lab 04/19/18 1140 04/19/18 1758 04/19/18 2316 04/20/18 0443  TROPONINI <0.03 <0.03 <0.03 <0.03    HbA1C: Hgb A1c MFr Bld  Date/Time Value Ref Range Status  11/24/2010 03:55 PM 6.4 (H) <5.7 % Final    Comment:    (NOTE)                                                                       According to the ADA Clinical Practice Recommendations for 2011, when HbA1c is used as a screening test:  >=6.5%   Diagnostic of Diabetes Mellitus           (if abnormal result is confirmed) 5.7-6.4%   Increased risk of developing Diabetes  [place] References:Diagnosis and Classification of Diabetes Mellitus,Diabetes VZCH,8850,27(XAJOI 1):S62-S69 and Standards of Medical Care in         Diabetes - 2011,Diabetes NOMV,6720,94 (Suppl 1):S11-S61.  11/24/2010 06:10 AM 6.3 (H) <5.7 % Final    Comment:    (NOTE)                                                                       According to the ADA Clinical Practice Recommendations for 2011, when HbA1c is used  as a screening test:  >=6.5%   Diagnostic of Diabetes Mellitus           (if abnormal result is confirmed) 5.7-6.4%   Increased risk of developing Diabetes Mellitus References:Diagnosis and Classification of Diabetes Mellitus,Diabetes WPYK,9983,38(SNKNL 1):S62-S69 and Standards of Medical Care in         Diabetes - 2011,Diabetes ZJQB,3419,37 (Suppl 1):S11-S61.    CBG: Recent Labs  Lab 04/23/18 0731 04/23/18 1137 04/23/18 1619 04/23/18 2156 04/24/18 1053  GLUCAP 113* 84 86 115* 100*    Review of Systems:   Negative apart from that which is mentioned in HPI  Past Medical History  She,  has a past medical history of Acute on chronic diastolic CHF (congestive heart failure), NYHA class 1 ([place]) (04/29/2013), Allergic rhinitis, Brady-tachy syndrome ([place]), CAD (coronary artery disease), Diabetes mellitus ([place]), DJD (degenerative joint disease), Dysrhythmia, Hypertension, Hyperthyroidism, Paroxysmal atrial fibrillation ([place]), Polio, PONV (postoperative nausea and vomiting), and Presence of permanent cardiac pacemaker.   Surgical History    Past Surgical History:  Procedure Laterality Date  . arm surgery d/t fx Right 96  . ATRIAL FIBRILLATION ABLATION  07/31/2012; 08-10-13   PVI by Dr Rayann Heman  . ATRIAL FIBRILLATION ABLATION N/A 07/31/2012   Procedure: ATRIAL FIBRILLATION ABLATION;  Surgeon: Thompson Grayer, MD;  Location[facility] CATH LAB;  Service: Cardiovascular;  Laterality: N/A;  . ATRIAL FIBRILLATION ABLATION N/A 08/10/2013   Procedure: ATRIAL FIBRILLATION ABLATION;   Surgeon: Coralyn Mark, MD;  Location: [place] CATH LAB;  Service: Cardiovascular;  Laterality: N/A;  . CARDIAC CATHETERIZATION  12/03/2010   single vessel mid RCA  . CHOLECYSTECTOMY  1994  . COLONOSCOPY WITH PROPOFOL N/A 03/21/2015   Procedure: COLONOSCOPY WITH PROPOFOL;  Surgeon: Juanita Craver, MD;  Location: [facility] ENDOSCOPY;  Service: Endoscopy;  Laterality: N/A;  . LEFT HEART CATH AND CORONARY ANGIOGRAPHY N/A 07/30/2017   Procedure: LEFT HEART CATH AND CORONARY ANGIOGRAPHY;  Surgeon: Troy Sine, MD;  Location: [place] CV LAB;  Service: Cardiovascular;  Laterality: N/A;  . LUMBAR LAMINECTOMY/DECOMPRESSION MICRODISCECTOMY Right 12/23/2012   Procedure: Right L5-S1 Laminotomy for resection of synovial cyst;  Surgeon: Hosie Spangle, MD;  Location: [place] NEURO ORS;  Service: Neurosurgery;  Laterality: Right;  Right Lumbar five-sacral one Laminotomy for resection of synovial cyst  . NM MYOCAR PERF WALL MOTION  12/20/2010   normal  . PACEMAKER INSERTION  12/03/10   SJM Accent DR RF implanted by Dr Sallyanne Kuster  . RIGHT HEART CATH N/A 04/22/2018   Procedure: RIGHT HEART CATH;  Surgeon: Wellington Hampshire, MD;  Location: [place] CV LAB;  Service: Cardiovascular;  Laterality: N/A;  . TEE WITHOUT CARDIOVERSION N/A 07/30/2012   Procedure: TRANSESOPHAGEAL ECHOCARDIOGRAM (TEE);  Surgeon: Sanda Klein, MD;  Location: [facility] ENDOSCOPY;  Service: Cardiovascular;  Laterality: N/A;  h&p in file-hope  . TEE WITHOUT CARDIOVERSION N/A 08/09/2013   Procedure: TRANSESOPHAGEAL ECHOCARDIOGRAM (TEE);  Surgeon: Lelon Perla, MD;  Location: [place];  Service: Cardiovascular;  Laterality: N/A;  . TOTAL ABDOMINAL HYSTERECTOMY  1981  . ULTRASOUND GUIDANCE FOR VASCULAR ACCESS  04/22/2018   Procedure: Ultrasound Guidance For Vascular Access;  Surgeon: Wellington Hampshire, MD;  Location: [place] CV LAB;  Service: Cardiovascular;;     Social History   reports that she has never smoked. She has never used smokeless tobacco.  She reports that she does not drink alcohol or use drugs.   Family History   Her family history includes Breast cancer in her sister and sister; Heart  disease in her sister.   Allergies Allergies  Allergen Reactions  . Codeine Nausea And Vomiting and Nausea Only  . Ketorolac Nausea And Vomiting, Nausea Only and Other (See Comments)    "pt told to never take this med"  . Meperidine Nausea Only  . Demerol Nausea And Vomiting  . Dronedarone Other (See Comments)    Increased fluid retention  . Cyclobenzaprine Nausea And Vomiting     Home Medications  Prior to Admission medications   Medication Sig Start Date End Date Taking? Authorizing Provider  Cholecalciferol (VITAMIN D3) 1000 UNITS CAPS Take 1 capsule by mouth daily.    Yes [provider]  Cyanocobalamin (VITAMIN B12 PO) Take 1 tablet by mouth daily as needed (for energy).    Yes [provider]  DILT-XR 180 MG 24 hr capsule TAKE 1 CAPSULE BY MOUTH ONCE DAILY Patient taking differently: Take 180 mg by mouth daily.  01/06/18  Yes Croitoru, Mihai, MD  furosemide (LASIX) 40 MG tablet Take 1 tablet (40 mg total) by mouth daily. 07/24/17  Yes Croitoru, Mihai, MD  loperamide (IMODIUM A-D) 2 MG tablet Take 2 mg by mouth as needed for diarrhea or loose stools.    Yes [provider]  losartan (COZAAR) 25 MG tablet Take 25 mg by mouth daily.  05/22/17  Yes [provider]  metoprolol tartrate (LOPRESSOR) 50 MG tablet Take 1 tablet (50 mg total) by mouth 2 (two) times daily. 07/24/17  Yes Croitoru, Mihai, MD  OVER THE COUNTER MEDICATION Take 1 tablet by mouth daily. Claritin   Yes [provider]  oxybutynin (DITROPAN) 5 MG tablet Take 5 mg by mouth daily.  09/07/15  Yes [provider]  pantoprazole (PROTONIX) 40 MG tablet Take 40 mg by mouth daily.   Yes [provider]  potassium chloride SA (KLOR-CON M20) 20 MEQ tablet Take 1 tablet (20 mEq total) by mouth daily. Patient taking  differently: Take 10 mEq by mouth daily.  07/24/17 07/19/18 Yes Croitoru, Mihai, MD  PRADAXA 150 MG CAPS capsule TAKE 1 CAPSULE BY MOUTH TWICE DAILY Patient taking differently: Take 150 mg by mouth 2 (two) times daily.  11/18/17  Yes Allred, Jeneen Rinks, MD  propylthiouracil (PTU) 50 MG tablet Take 50-100 mg by mouth See admin instructions. Take 100 mg by mouth in the morning and take 50 mg by mouth at bedtime   Yes [provider]  saxagliptin HCl (ONGLYZA) 5 MG TABS tablet Take 5 mg by mouth daily.   Yes [provider]     80 min spent with >50% in counseling and coordination of care.    Kipp Brood, MD [facility] ICU Physician [place]  Pager: [phone] Mobile: [phone] After hours: [phone].

## 2018-04-24 NOTE — Progress Notes (Signed)
Oxygen saturation is actually substantially better today (91% with ambulation on room air). May be able to go home without O2. Awaiting Pulmonary consultation. Ordered Revatio 20 mg TID, unless Pulmonary believes it is contraindicated.  Sanda Klein, MD, [facility] [phone] office [phone] pager

## 2018-04-24 NOTE — Progress Notes (Signed)
SATURATION QUALIFICATIONS: (This note is used to comply with regulatory documentation for home oxygen)  Patient Saturations on Room Air at Rest = 97 %  Patient Saturations on Room Air while Ambulating = 91 %  Patient Saturations on 2  Liters of oxygen while Ambulating =  94 %  Please briefly explain why patient needs home oxygen:

## 2018-04-25 ENCOUNTER — Other Ambulatory Visit: Payer: Self-pay | Admitting: Medical

## 2018-04-25 DIAGNOSIS — I480 Paroxysmal atrial fibrillation: Secondary | ICD-10-CM

## 2018-04-25 DIAGNOSIS — I5033 Acute on chronic diastolic (congestive) heart failure: Secondary | ICD-10-CM

## 2018-04-25 LAB — CBC WITH DIFFERENTIAL/PLATELET
Abs Immature Granulocytes: 0.01 10*3/uL (ref 0.00–0.07)
Basophils Absolute: 0 10*3/uL (ref 0.0–0.1)
Basophils Relative: 0 %
Eosinophils Absolute: 0.3 10*3/uL (ref 0.0–0.5)
Eosinophils Relative: 6 %
HCT: 34.2 % — ABNORMAL LOW (ref 36.0–46.0)
Hemoglobin: 9.9 g/dL — ABNORMAL LOW (ref 12.0–15.0)
Immature Granulocytes: 0 %
LYMPHS ABS: 1.2 10*3/uL (ref 0.7–4.0)
LYMPHS PCT: 22 %
MCH: 22.7 pg — AB (ref 26.0–34.0)
MCHC: 28.9 g/dL — ABNORMAL LOW (ref 30.0–36.0)
MCV: 78.3 fL — ABNORMAL LOW (ref 80.0–100.0)
MONO ABS: 0.5 10*3/uL (ref 0.1–1.0)
MONOS PCT: 9 %
Neutro Abs: 3.3 10*3/uL (ref 1.7–7.7)
Neutrophils Relative %: 63 %
Platelets: 300 10*3/uL (ref 150–400)
RBC: 4.37 MIL/uL (ref 3.87–5.11)
RDW: 15.9 % — ABNORMAL HIGH (ref 11.5–15.5)
WBC: 5.3 10*3/uL (ref 4.0–10.5)
nRBC: 0 % (ref 0.0–0.2)

## 2018-04-25 LAB — BASIC METABOLIC PANEL
Anion gap: 9 (ref 5–15)
BUN: 11 mg/dL (ref 8–23)
CO2: 29 mmol/L (ref 22–32)
Calcium: 8.7 mg/dL — ABNORMAL LOW (ref 8.9–10.3)
Chloride: 100 mmol/L (ref 98–111)
Creatinine, Ser: 1.05 mg/dL — ABNORMAL HIGH (ref 0.44–1.00)
GFR calc Af Amer: 60 mL/min — ABNORMAL LOW (ref >60)
GFR calc non Af Amer: 51 mL/min — ABNORMAL LOW (ref >60)
Glucose, Bld: 108 mg/dL — ABNORMAL HIGH (ref 70–99)
Potassium: 3.4 mmol/L — ABNORMAL LOW (ref 3.5–5.1)
SODIUM: 138 mmol/L (ref 135–145)

## 2018-04-25 LAB — GLUCOSE, CAPILLARY
GLUCOSE-CAPILLARY: 106 mg/dL — AB (ref 70–99)
GLUCOSE-CAPILLARY: 134 mg/dL — AB (ref 70–99)

## 2018-04-25 LAB — VITAMIN B12: VITAMIN B 12: 385 pg/mL (ref 180–914)

## 2018-04-25 LAB — IRON AND TIBC
IRON: 18 ug/dL — AB (ref 28–170)
Saturation Ratios: 5 % — ABNORMAL LOW (ref 10.4–31.8)
TIBC: 372 ug/dL (ref 250–450)
UIBC: 354 ug/dL

## 2018-04-25 LAB — FOLATE: FOLATE: 16.9 ng/mL (ref >5.9)

## 2018-04-25 LAB — FERRITIN: Ferritin: 24 ng/mL (ref 11–307)

## 2018-04-25 MED ORDER — FUROSEMIDE 80 MG PO TABS: 80.0000 mg | ORAL_TABLET | Freq: Every day | ORAL | Status: DC

## 2018-04-25 MED ORDER — SPIRONOLACTONE 25 MG PO TABS
25.0000 mg | ORAL_TABLET | Freq: Every day | ORAL | Status: DC
  Administered 2018-04-25: 25 mg via ORAL
  Filled 2018-04-25: qty 1

## 2018-04-25 MED ORDER — SPIRONOLACTONE 25 MG PO TABS: 25.0000 mg | ORAL_TABLET | Freq: Every day | ORAL | 0 refills | Status: DC

## 2018-04-25 MED ORDER — FERROUS SULFATE 325 (65 FE) MG PO TABS: 325.0000 mg | ORAL_TABLET | Freq: Every day | ORAL | 0 refills | Status: DC

## 2018-04-25 MED ORDER — FERROUS SULFATE 325 (65 FE) MG PO TABS
325.0000 mg | ORAL_TABLET | Freq: Every day | ORAL | Status: DC
  Administered 2018-04-25: 325 mg via ORAL
  Filled 2018-04-25: qty 1

## 2018-04-25 MED ORDER — FUROSEMIDE 80 MG PO TABS: 80.0000 mg | ORAL_TABLET | Freq: Every day | ORAL | 0 refills | Status: DC

## 2018-04-25 MED ORDER — SODIUM CHLORIDE 0.9 % IV SOLN
510.0000 mg | Freq: Once | INTRAVENOUS | Status: AC
  Administered 2018-04-25: 510 mg via INTRAVENOUS
  Filled 2018-04-25: qty 17

## 2018-04-25 MED ORDER — POTASSIUM CHLORIDE CRYS ER 20 MEQ PO TBCR
40.0000 meq | EXTENDED_RELEASE_TABLET | Freq: Once | ORAL | Status: AC
  Administered 2018-04-25: 40 meq via ORAL
  Filled 2018-04-25: qty 2

## 2018-04-25 NOTE — Progress Notes (Signed)
Pt desaturated on room air during her sleep last night. She sustained at 88% before applying nasal cannula at 1L was applied, and from there she remained around 95-96%.

## 2018-04-25 NOTE — Progress Notes (Signed)
Progress Note  Patient Name: Hailey [name] Date of Encounter: 04/25/2018  Primary Cardiologist: Sanda Klein, MD   Subjective   Doing better.  One brief episode of desaturation overnight, but currently 99% on room air at rest. Appreciate pulmonary consultation. Also reviewed the CT images with Dr. Marjean Donna who does not think there is any evidence of interstitial lung disease.  He did state that the abnormality seen on the pulmonary function tests will require follow-up.  Inpatient Medications    Scheduled Meds: . dabigatran  150 mg Oral BID  . diltiazem  180 mg Oral Daily  . ferrous sulfate  325 mg Oral Q breakfast  . furosemide  80 mg Oral Daily  . insulin aspart  0-9 Units Subcutaneous TID WC  . metoprolol tartrate  50 mg Oral BID  . oxybutynin  5 mg Oral Daily  . pantoprazole  40 mg Oral Daily  . potassium chloride  40 mEq Oral Once  . propylthiouracil  100 mg Oral q morning - 10a  . propylthiouracil  50 mg Oral QHS  . senna-docusate  1 tablet Oral BID  . sodium chloride flush  3 mL Intravenous Q12H  . spironolactone  25 mg Oral Daily  . zolpidem  5 mg Oral Once   Continuous Infusions: . sodium chloride    . ferumoxytol     PRN Meds: sodium chloride, acetaminophen, guaiFENesin-dextromethorphan, iopamidol, loperamide, ondansetron (ZOFRAN) IV, sodium chloride flush   Vital Signs    Vitals:   04/24/18 2239 04/25/18 0500 04/25/18 0830 04/25/18 0831  BP:  110/61 (!) 112/42   Pulse:  72  70  Resp:  20    Temp:  98 F (36.7 C)    TempSrc:  Oral    SpO2: 96%     Weight:  57 kg    Height:        Intake/Output Summary (Last 24 hours) at 04/25/2018 1025 Last data filed at 04/25/2018 0500 Gross per 24 hour  Intake 240 ml  Output 1300 ml  Net -1060 ml   Filed Weights   04/23/18 0606 04/24/18 0601 04/25/18 0500  Weight: 57.2 kg 57.6 kg 57 kg    Telemetry    Atrial fibrillation with ventricular pacing- Personally Reviewed  ECG    No new tracing-  Personally Reviewed  Physical Exam  Is comfortable lying flat in bed GEN: No acute distress.   Neck: No JVD Cardiac: RRR, second heart sound, no murmurs, rubs, or gallops.  Respiratory: Clear to auscultation bilaterally. GI: Soft, nontender, non-distended  MS: No edema; No deformity. Neuro:  Nonfocal  Psych: Normal affect   Labs    Chemistry Recent Labs  Lab 04/19/18 1140  04/23/18 0410 04/24/18 0356 04/25/18 0449  NA 137   < > 138 139 138  K 3.3*   < > 3.4* 3.9 3.4*  CL 102   < > 101 104 100  CO2 24   < > 29 29 29   GLUCOSE 128*   < > 101* 103* 108*  BUN 10   < > 11 12 11   CREATININE 0.60   < > 0.92 0.89 1.05*  CALCIUM 8.9   < > 8.8* 9.0 8.7*  PROT 6.7  --   --   --   --   ALBUMIN 3.9  --   --   --   --   AST 22  --   --   --   --   ALT 22  --   --   --   --  ALKPHOS 81  --   --   --   --   BILITOT 1.1  --   --   --   --   GFRNONAA >60   < > >60 >60 51*  GFRAA >60   < > >60 >60 60*  ANIONGAP 11   < > 8 6 9    < > = values in this interval not displayed.     Hematology Recent Labs  Lab 04/23/18 0410 04/24/18 0356 04/25/18 0449  WBC 5.8 4.8 5.3  RBC 4.32 4.28 4.37  HGB 10.0* 9.8* 9.9*  HCT 34.0* 33.9* 34.2*  MCV 78.7* 79.2* 78.3*  MCH 23.1* 22.9* 22.7*  MCHC 29.4* 28.9* 28.9*  RDW 15.9* 15.9* 15.9*  PLT 283 269 300    Cardiac Enzymes Recent Labs  Lab 04/19/18 1140 04/19/18 1758 04/19/18 2316 04/20/18 0443  TROPONINI <0.03 <0.03 <0.03 <0.03   No results for input(s): TROPIPOC in the last 168 hours.   BNP Recent Labs  Lab 04/19/18 1140  BNP 393.4*     DDimer No results for input(s): DDIMER in the last 168 hours.   Radiology    Ct Chest W Contrast  Result Date: 04/23/2018 CLINICAL DATA:  Declining oxygen saturations.  Short of breath. EXAM: CT CHEST WITH CONTRAST TECHNIQUE: Multidetector CT imaging of the chest was performed during intravenous contrast administration. CONTRAST:  68mL OMNIPAQUE IOHEXOL 300 MG/ML  SOLN COMPARISON:  Chest  radiograph, 04/19/2018 and older exams. FINDINGS: Cardiovascular: Heart is normal in size and configuration. No pericardial effusion. Great vessels are normal in caliber. Mild aortic atherosclerosis. No dissection. Mediastinum/Nodes: Heterogeneous enlarged thyroid gland with several ill-defined nodules. Largest discrete nodule in the right lobe measuring 17 mm. Left lobe larger than the right deviating the trachea to the right. There are prominent, shotty mediastinal lymph nodes, largest a 14 mm short axis subcarinal node. Are also prominent to mildly enlarged bilateral hilar lymph nodes. Trachea is widely patent. Esophagus is unremarkable. Lungs/Pleura: Moderate right and small left pleural effusions. There are multiple nodules. 5 mm nodule, left upper lobe, image 47, series 7. 5 mm nodule, left upper lobe, image 58. 4 mm nodule, left upper lobe, image 77. 7 x 6 mm nodule, left lower lobe, image 78. 6 mm nodule, left lower lobe, image 101. Ill-defined 5 mm nodule, right lower lobe, image 85. 4 mm nodule, right upper lobe, image 72. There are multiple other nodules, 3 mm in less. Dependent opacity is noted, right greater than left, consistent with atelectasis. There is no evidence of pneumonia or pulmonary edema. No pneumothorax. Upper Abdomen: No acute findings. Status post cholecystectomy. Common bile duct dilated to 1.5 cm with distal tapering. This is presumed chronic. Musculoskeletal: No acute fracture. No osteoblastic or osteolytic lesions. Left anterior chest wall pacemaker leads lie within the right atrium and right ventricle. IMPRESSION: 1. Moderate right and small left pleural effusions with associated dependent atelectasis. No evidence of pneumonia or pulmonary edema. 2. Multiple small pulmonary nodules. Non-contrast chest CT at 3-6 months is recommended. If the nodules are stable at time of repeat CT, then future CT at 18-24 months (from today's scan) is considered optional for low-risk patients, but is  recommended for high-risk patients. This recommendation follows the consensus statement: Guidelines for Management of Incidental Pulmonary Nodules Detected on CT Images: From the Fleischner Society 2017; Radiology 2017; 284:228-243. 3. Enlarged thyroid reflecting a multinodular goiter. Largest discrete nodule measures 1.7 cm. Consider further evaluation with thyroid ultrasound. If patient is clinically  hyperthyroid, consider nuclear medicine thyroid uptake and scan. 4. Aortic atherosclerosis. Aortic Atherosclerosis (ICD10-I70.0). Electronically Signed   By: Lajean Manes M.D.   On: 04/23/2018 17:40    Cardiac Studies   Right heart catheterization April 22, 2018 1. Mildly elevated left-sided filling pressures. 2. Moderate to severe pulmonary hypertension with normal cardiac output.  Pulmonary capillary wedge pressure: 19 mmHg. PA pressure: 68/28 with a mean of 43 mmHg. Pulmonary vascular resistance: 4.9 Woods units Cardiac index was 2.97.  Recommendations: The patient seems to have mixed pulmonary hypertension due to left-sided heart failure and primary pulmonary hypertension. She is mildly volume overloaded and I suspect that she can switch to oral furosemide tomorrow. Consider vasodilator therapy for pulmonary hypertension.  Left heart catheterization July 30, 2017   Ost 1st Diag to 1st Diag lesion is 10% stenosed.  Mid RCA lesion is 20% stenosed.  The left ventricular systolic function is normal.  LV end diastolic pressure is normal.  No significant coronary obstructive disease with mild smooth 10% narrowing in the proximal to midportion of the first diagonal branch of the LAD; normal left circumflex coronary artery; and smooth 20% mid RCA narrowing.  Normal LV function without focal segmental wall motion abnormalities. LVEDP 7.  RECOMMENDATION: Medical therapy.  Echo April 20, 2018   - Left ventricle: The cavity size was normal. Systolic function  was normal. The estimated ejection fraction was in the range of 55% to 60%. Wall motion was normal; there were no regional wall motion abnormalities. There was a reduced contribution of atrial contraction to ventricular filling, due to increased ventricular diastolic pressure or atrial contractile dysfunction. Features are consistent with a pseudonormal left ventricular filling pattern, with concomitant abnormal relaxation and increased filling pressure (grade 2 diastolic dysfunction). - Left atrium: The atrium was mildly dilated. - Right ventricle: The cavity size was mildly dilated. Wall thickness was normal. - Right atrium: The atrium was mildly dilated. - Tricuspid valve: There was mild-moderate regurgitation directed centrally. - Pulmonary arteries: Systolic pressure was moderately to severely increased. PA peak pressure: 66 mm Hg (S).  Impressions:  - PA pressure has increased since the previous study.  Patient Profile     72 y.o. female presenting with acute hypoxic respiratory failure in the setting of acute on chronic diastolic heart failure but also newly diagnosed severe pulmonary artery hypertension, background of paroxysmal atrial fibrillation, second-degree AV block status post dual-chamber permanent pacemaker, moderate asymptomatic CAD.  Assessment & Plan    1. YJE:HUDJSHFWY left ventricular ejection fraction.Per right heart catheterization findings 11/20 shewas almost at euvolemia, PAWP 19.Switchedto oral diuretics, in/out and weight staying pretty even.  Residual problems with pulmonary hypertension, pleural effusions and hypoxemia cannot be attributed to heart failure.  Have increased her home dose of furosemide and added spironolactone.  Stop losartan to avoid hypotension. 2. PAH:The degree of pulmonary artery hypertension is substantially worsened compared to 2017.There is a minor component ofleft heart failure and mostly seems to  be secondary tointrinsic pulmonary arteriolar disease. She does not have the typical symptoms of obstructive sleep apnea and is on chronic anticoagulation, which makes chronic thromboembolic disease less likely.  Hold off on any vasodilators for the time being, reevaluate echo in roughly 4 weeks after she is been on increased dose of diuretics. 3. Possible interstitial lung disease:  PFTs show moderate ventilatory abnormalities, matched FVC/FEV1 and fairly matched diffusion capacity impairment.  CT of chest shows multiple subcentimeter nodules, but no clear evidence of advanced lung fibrosis.  The  PFT abnormalities have been stable over the last 6 months.  reviewed CT images with Dr. Marjean Donna, low level of suspicion for ILD. 4. AFib:Pacemaker interrogation yesterday shows similar burden of A. fib as in the past around roughly 15%. She has never been symptomatic with atrial fibrillation. She is therapeutically anticoagulated.  5. CAD:She does not have angina and has had no evidence of disease progression between 2012 and 2019 left heart catheterizations.On statin. 6. ZOX:[id number] device check performed yesterday, normal findings.She did have aperiodof decompensation when she lost ventricular capture about a year ago, resolved by reprogramming her ventricular lead inunipolar pacing mode(not an issue currently).She is not device dependent. 7. JYN:WGNF not in atrial fibrillation pacesthe atriumalmost 100% of the time.  8. 2nd deg AVB:She has occasional periods of AV block, but only requires 20% ventricular pacing. She was very symptomatic with loss of ventricular capture earlier this year.  Discussed with attending physician Dr. Horris Latino.  [facility] will sign off.   Medication Recommendations: Furosemide 80 mg once daily, spironolactone 25 mg once daily, stop losartan.  Continue metoprolol and diltiazem. Other recommendations (labs, testing, etc): basic metabolic panel  in 2 weeks.  Follow-up outpatient echo for pulmonary artery pressure in 1 month Follow up as an outpatient: Follow-up with me in 4 to 6 weeks.  We will make those arrangements.  For questions or updates, please contact [facility] Please consult [email or web address] for contact info under        Signed, Sanda Klein, MD  04/25/2018, 10:25 AM

## 2018-04-25 NOTE — Discharge Summary (Signed)
Discharge Summary  Hailey [name]:[id number] DOB: [birth year]  PCP: Jani Gravel, MD  Admit date: 04/19/2018 Discharge date: 04/25/2018  Time spent: 35 mins  Recommendations for Outpatient Follow-up:  1. PCP 2. Cardiology 3. Pulmonology  Discharge Diagnoses:  Active Hospital Problems   Diagnosis Date Noted  . Acute on chronic diastolic CHF (congestive heart failure), NYHA class 1 ([place]) 04/29/2013  . SSS (sick sinus syndrome) ([place])   . Essential hypertension 09/08/2017  . Dyspnea 09/08/2017  . Hyperthyroidism 09/08/2017  . Second degree AV block 10/10/2015  . CAD (coronary artery disease) 08/28/2013  . Atrial fibrillation ([place]) 03/02/2012    Resolved Hospital Problems  No resolved problems to display.    Discharge Condition: Stable  Diet recommendation: Heart healthy  Vitals:   04/25/18 0830 04/25/18 0831  BP: (!) 112/42   Pulse:  70  Resp:    Temp:    SpO2:      History of present illness:  72 year old female with history of hypertension, diabetes, chronic diastolic CHF, A. fib/sick sinus syndrome with pacemaker, was admitted to the hospital on 11/17 with relatively acute onset of shortness of breath.  She reports a cough for the prior 2 days, weakness, and she was concerned that she would get an upper respiratory infection with sinus congestion and pressure.  Cough has been nonproductive. Pt admitted for further management.    Today, patient reported feeling much better, saturations have improved on room air overall.  She denies any new complaints, denies any worsening shortness of breath, chest pain, fever/chills.  Patient stable to be discharged with close follow-up with cardiology and pulmonology.  Hospital Course:  Principal Problem:   Acute on chronic diastolic CHF (congestive heart failure), NYHA class 1 (H[facility]) Active Problems:   Atrial fibrillation (H[facility])   CAD (coronary artery disease)   Second degree AV block   Dyspnea   Hyperthyroidism  Essential hypertension   SSS (sick sinus syndrome) (H[facility])  Acute hypoxic respiratory failure due to acute on chronic diastolic CHF/pulmonary hypertension -Patient with evidence of fluid overload with lower extremity edema, pulmonary edema on chest x-ray on admission, diuresed and now euvolemic -2D echo obtained showed normal EF, grade 2 diastolic dysfunction as well as her PA pressure has increased compared to the prior study.  -Cardiology was consulted and underwent a right heart cath on 11/20 which showed mildly elevated left-sided filling pressures and moderate to severe pulmonary hypertension with normal cardiac output.  Recommend discharging patient on Lasix and spironolactone.  Stop losartan to avoid hypotension Pulmonary consulted: CT scan of the chest which was negative for any lung fibrosis, reviewed with Dr Marjean Donna, low suspicion of ILD.  PFTs consistent with restrictive pattern, possibly chest wall restriction from kyphoscoliosis Follow-up with both cardiology and pulmonology  Lobar pneumonia -Chest x-ray with infiltrate in the medial right lung base, she did have URI type symptoms as well as cough and may be indicative of early pneumonia.  Completed Levaquin for 5 days.  Chronic A. fib -Now rate controlled with PPM, continue Pradaxa for anticoagulation Continue diltiazem, metoprolol  Diabetes mellitus Continue home regimen  Sick sinus syndrome -Pacemaker in place  Hypertension Blood pressure on the soft side, discontinue home losartan Continue all other BP medications  Microcytic anemia likely 2/2 iron deficiency Hemoglobin at baseline Status post 1 dose of Feraheme on 04/25/2018 Continue oral iron supplementation Follow-up with PCP for possible second dose of Feraheme in 1 week    Procedures:  Right heart catheterization April 22, 2018  Consultations:  Cardiology  Pulmonology  Discharge Exam: BP (!) 112/42   Pulse 70   Temp 98 F (36.7 C) (Oral)    Resp 20   Ht 4\' 11"  (1.499 m)   Wt 57 kg   SpO2 96%   BMI 25.37 kg/m   General: NAD Cardiovascular: S1, S2 present Respiratory: CTAB  Discharge Instructions You were cared for by a hospitalist during your hospital stay. If you have any questions about your discharge medications or the care you received while you were in the hospital after you are discharged, you can call the unit and asked to speak with the hospitalist on call if the hospitalist that took care of you is not available. Once you are discharged, your primary care physician will handle any further medical issues. Please note that NO REFILLS for any discharge medications will be authorized once you are discharged, as it is imperative that you return to your primary care physician (or establish a relationship with a primary care physician if you do not have one) for your aftercare needs so that they can reassess your need for medications and monitor your lab values.   Allergies as of 04/25/2018      Reactions   Codeine Nausea And Vomiting, Nausea Only   Ketorolac Nausea And Vomiting, Nausea Only, Other (See Comments)   "pt told to never take this med"   Meperidine Nausea Only   Demerol Nausea And Vomiting   Dronedarone Other (See Comments)   Increased fluid retention   Cyclobenzaprine Nausea And Vomiting      Medication List    STOP taking these medications   losartan 25 MG tablet Commonly known as:  COZAAR     TAKE these medications   DILT-XR 180 MG 24 hr capsule Generic drug:  diltiazem TAKE 1 CAPSULE BY MOUTH ONCE DAILY What changed:  how much to take   ferrous sulfate 325 (65 FE) MG tablet Take 1 tablet (325 mg total) by mouth daily with breakfast. Start taking on:  04/26/2018   furosemide 80 MG tablet Commonly known as:  LASIX Take 1 tablet (80 mg total) by mouth daily. Start taking on:  04/26/2018 What changed:    medication strength  how much to take   loperamide 2 MG tablet Commonly known  as:  IMODIUM A-D Take 2 mg by mouth as needed for diarrhea or loose stools.   metoprolol tartrate 50 MG tablet Commonly known as:  LOPRESSOR Take 1 tablet (50 mg total) by mouth 2 (two) times daily.   ONGLYZA 5 MG Tabs tablet Generic drug:  saxagliptin HCl Take 5 mg by mouth daily.   OVER THE COUNTER MEDICATION Take 1 tablet by mouth daily. Claritin   oxybutynin 5 MG tablet Commonly known as:  DITROPAN Take 5 mg by mouth daily.   pantoprazole 40 MG tablet Commonly known as:  PROTONIX Take 40 mg by mouth daily.   potassium chloride SA 20 MEQ tablet Commonly known as:  K-DUR,KLOR-CON Take 1 tablet (20 mEq total) by mouth daily. What changed:  how much to take   PRADAXA 150 MG Caps capsule Generic drug:  dabigatran TAKE 1 CAPSULE BY MOUTH TWICE DAILY What changed:  how much to take   propylthiouracil 50 MG tablet Commonly known as:  PTU Take 50-100 mg by mouth See admin instructions. Take 100 mg by mouth in the morning and take 50 mg by mouth at bedtime   spironolactone 25 MG tablet Commonly known as:  ALDACTONE Take  1 tablet (25 mg total) by mouth daily. Start taking on:  04/26/2018   VITAMIN B12 PO Take 1 tablet by mouth daily as needed (for energy).   Vitamin D3 25 MCG (1000 UT) Caps Take 1 capsule by mouth daily.      Allergies  Allergen Reactions  . Codeine Nausea And Vomiting and Nausea Only  . Ketorolac Nausea And Vomiting, Nausea Only and Other (See Comments)    "pt told to never take this med"  . Meperidine Nausea Only  . Demerol Nausea And Vomiting  . Dronedarone Other (See Comments)    Increased fluid retention  . Cyclobenzaprine Nausea And Vomiting   Follow-up Information    Collene Gobble, MD. Schedule an appointment as soon as possible for a visit on 06/01/2018.   Specialty:  Pulmonary Disease Why:  Patient to have CXR on day of visit.  Patient has an appointment at 1015 on 06/01/18 Contact information: [address]  [place] [address] [phone]        [facility] Follow up.   Specialty:  Cardiology Why:  Someone from the office will contact you directly to determine a date/time for you to get an ultrasound of your heart (an echocardiogram). You will also need a follow-up appointment with Dr. Sallyanne Kuster after completion of your ultrasound.  Contact information: [address] [place]       Jani Gravel, MD. Schedule an appointment as soon as possible for a visit in 1 week(s).   Specialty:  Internal Medicine Contact information: [place] [address] [phone]        Sanda Klein, MD .   Specialty:  Cardiology Contact information: [address] [place] [address] [phone]            The results of significant diagnostics from this hospitalization (including imaging, microbiology, ancillary and laboratory) are listed below for reference.    Significant Diagnostic Studies: Dg Chest 2 View  Result Date: 04/19/2018 CLINICAL DATA:  Nausea and shortness of breath for several hours EXAM: CHEST - 2 VIEW COMPARISON:  09/11/2017 FINDINGS: Cardiac shadow is enlarged but stable. Defibrillator is again seen. Patchy infiltrate is noted in the medial right lung base with chronic blunting of the costophrenic angle. Stable scarring in the left base is seen as well. No other focal abnormality is noted. IMPRESSION: Chronic changes in the bases bilaterally with some new increased right medial infiltrate. Electronically Signed   By: Inez Catalina M.D.   On: 04/19/2018 11:43   Ct Chest W Contrast  Result Date: 04/23/2018 CLINICAL DATA:  Declining oxygen saturations.  Short of breath. EXAM: CT CHEST WITH CONTRAST TECHNIQUE: Multidetector CT imaging of the chest was performed during intravenous contrast administration. CONTRAST:  10mL OMNIPAQUE IOHEXOL 300 MG/ML  SOLN COMPARISON:   Chest radiograph, 04/19/2018 and older exams. FINDINGS: Cardiovascular: Heart is normal in size and configuration. No pericardial effusion. Great vessels are normal in caliber. Mild aortic atherosclerosis. No dissection. Mediastinum/Nodes: Heterogeneous enlarged thyroid gland with several ill-defined nodules. Largest discrete nodule in the right lobe measuring 17 mm. Left lobe larger than the right deviating the trachea to the right. There are prominent, shotty mediastinal lymph nodes, largest a 14 mm short axis subcarinal node. Are also prominent to mildly enlarged bilateral hilar lymph nodes. Trachea is widely patent. Esophagus is unremarkable. Lungs/Pleura: Moderate right and small left pleural effusions. There are multiple nodules. 5 mm  nodule, left upper lobe, image 47, series 7. 5 mm nodule, left upper lobe, image 58. 4 mm nodule, left upper lobe, image 77. 7 x 6 mm nodule, left lower lobe, image 78. 6 mm nodule, left lower lobe, image 101. Ill-defined 5 mm nodule, right lower lobe, image 85. 4 mm nodule, right upper lobe, image 72. There are multiple other nodules, 3 mm in less. Dependent opacity is noted, right greater than left, consistent with atelectasis. There is no evidence of pneumonia or pulmonary edema. No pneumothorax. Upper Abdomen: No acute findings. Status post cholecystectomy. Common bile duct dilated to 1.5 cm with distal tapering. This is presumed chronic. Musculoskeletal: No acute fracture. No osteoblastic or osteolytic lesions. Left anterior chest wall pacemaker leads lie within the right atrium and right ventricle. IMPRESSION: 1. Moderate right and small left pleural effusions with associated dependent atelectasis. No evidence of pneumonia or pulmonary edema. 2. Multiple small pulmonary nodules. Non-contrast chest CT at 3-6 months is recommended. If the nodules are stable at time of repeat CT, then future CT at 18-24 months (from today's scan) is considered optional for low-risk patients,  but is recommended for high-risk patients. This recommendation follows the consensus statement: Guidelines for Management of Incidental Pulmonary Nodules Detected on CT Images: From the Fleischner Society 2017; Radiology 2017; 284:228-243. 3. Enlarged thyroid reflecting a multinodular goiter. Largest discrete nodule measures 1.7 cm. Consider further evaluation with thyroid ultrasound. If patient is clinically hyperthyroid, consider nuclear medicine thyroid uptake and scan. 4. Aortic atherosclerosis. Aortic Atherosclerosis (ICD10-I70.0). Electronically Signed   By: Lajean Manes M.D.   On: 04/23/2018 17:40    Microbiology: No results found for this or any previous visit (from the past 240 hour(s)).   Labs: Basic Metabolic Panel: Recent Labs  Lab 04/21/18 0335 04/22/18 0302 04/23/18 0410 04/24/18 0356 04/25/18 0449  NA 139 139 138 139 138  K 3.7 3.1* 3.4* 3.9 3.4*  CL 101 100 101 104 100  CO2 30 30 29 29 29   GLUCOSE 100* 119* 101* 103* 108*  BUN 8 10 11 12 11   CREATININE 1.00 0.90 0.92 0.89 1.05*  CALCIUM 8.8* 8.7* 8.8* 9.0 8.7*   Liver Function Tests: Recent Labs  Lab 04/19/18 1140  AST 22  ALT 22  ALKPHOS 81  BILITOT 1.1  PROT 6.7  ALBUMIN 3.9   No results for input(s): LIPASE, AMYLASE in the last 168 hours. No results for input(s): AMMONIA in the last 168 hours. CBC: Recent Labs  Lab 04/19/18 1140 04/22/18 0302 04/23/18 0410 04/24/18 0356 04/25/18 0449  WBC 7.2 5.8 5.8 4.8 5.3  NEUTROABS 5.7  --   --   --  3.3  HGB 10.1* 9.4* 10.0* 9.8* 9.9*  HCT 35.1* 32.7* 34.0* 33.9* 34.2*  MCV 80.0 79.0* 78.7* 79.2* 78.3*  PLT 286 273 283 269 300   Cardiac Enzymes: Recent Labs  Lab 04/19/18 1140 04/19/18 1758 04/19/18 2316 04/20/18 0443  TROPONINI <0.03 <0.03 <0.03 <0.03   BNP: BNP (last 3 results) Recent Labs    04/19/18 1140  BNP 393.4*    ProBNP (last 3 results) No results for input(s): PROBNP in the last 8760 hours.  CBG: Recent Labs  Lab  04/24/18 1053 04/24/18 1631 04/24/18 2142 04/25/18 0737 04/25/18 1207  GLUCAP 100* 92 115* 106* 134*       Signed:  Alma Friendly, MD [facility] 04/25/2018, 1:43 PM

## 2018-04-28 DIAGNOSIS — I110 Hypertensive heart disease with heart failure: Secondary | ICD-10-CM | POA: Diagnosis not present

## 2018-04-28 DIAGNOSIS — Z950 Presence of cardiac pacemaker: Secondary | ICD-10-CM | POA: Diagnosis not present

## 2018-04-28 DIAGNOSIS — H543 Unqualified visual loss, both eyes: Secondary | ICD-10-CM | POA: Diagnosis not present

## 2018-04-28 DIAGNOSIS — I272 Pulmonary hypertension, unspecified: Secondary | ICD-10-CM | POA: Diagnosis not present

## 2018-04-28 DIAGNOSIS — I480 Paroxysmal atrial fibrillation: Secondary | ICD-10-CM | POA: Diagnosis not present

## 2018-04-28 DIAGNOSIS — I5033 Acute on chronic diastolic (congestive) heart failure: Secondary | ICD-10-CM | POA: Diagnosis not present

## 2018-04-28 DIAGNOSIS — Z7901 Long term (current) use of anticoagulants: Secondary | ICD-10-CM | POA: Diagnosis not present

## 2018-04-28 DIAGNOSIS — I2510 Atherosclerotic heart disease of native coronary artery without angina pectoris: Secondary | ICD-10-CM | POA: Diagnosis not present

## 2018-04-28 DIAGNOSIS — I495 Sick sinus syndrome: Secondary | ICD-10-CM | POA: Diagnosis not present

## 2018-04-28 DIAGNOSIS — E119 Type 2 diabetes mellitus without complications: Secondary | ICD-10-CM | POA: Diagnosis not present

## 2018-04-28 DIAGNOSIS — Z8701 Personal history of pneumonia (recurrent): Secondary | ICD-10-CM | POA: Diagnosis not present

## 2018-04-29 ENCOUNTER — Other Ambulatory Visit: Payer: Self-pay

## 2018-04-29 ENCOUNTER — Ambulatory Visit (HOSPITAL_COMMUNITY): Payer: Medicare Other | Attending: Internal Medicine

## 2018-04-29 DIAGNOSIS — I5033 Acute on chronic diastolic (congestive) heart failure: Secondary | ICD-10-CM | POA: Diagnosis not present

## 2018-05-01 DIAGNOSIS — I2510 Atherosclerotic heart disease of native coronary artery without angina pectoris: Secondary | ICD-10-CM | POA: Diagnosis not present

## 2018-05-01 DIAGNOSIS — I272 Pulmonary hypertension, unspecified: Secondary | ICD-10-CM | POA: Diagnosis not present

## 2018-05-01 DIAGNOSIS — E119 Type 2 diabetes mellitus without complications: Secondary | ICD-10-CM | POA: Diagnosis not present

## 2018-05-01 DIAGNOSIS — I110 Hypertensive heart disease with heart failure: Secondary | ICD-10-CM | POA: Diagnosis not present

## 2018-05-01 DIAGNOSIS — I5033 Acute on chronic diastolic (congestive) heart failure: Secondary | ICD-10-CM | POA: Diagnosis not present

## 2018-05-01 DIAGNOSIS — I480 Paroxysmal atrial fibrillation: Secondary | ICD-10-CM | POA: Diagnosis not present

## 2018-05-04 DIAGNOSIS — Z950 Presence of cardiac pacemaker: Secondary | ICD-10-CM | POA: Diagnosis not present

## 2018-05-04 DIAGNOSIS — I480 Paroxysmal atrial fibrillation: Secondary | ICD-10-CM | POA: Diagnosis not present

## 2018-05-04 DIAGNOSIS — E119 Type 2 diabetes mellitus without complications: Secondary | ICD-10-CM | POA: Diagnosis not present

## 2018-05-04 DIAGNOSIS — I509 Heart failure, unspecified: Secondary | ICD-10-CM | POA: Diagnosis not present

## 2018-05-05 ENCOUNTER — Other Ambulatory Visit: Payer: Self-pay | Admitting: Medical

## 2018-05-05 DIAGNOSIS — I272 Pulmonary hypertension, unspecified: Secondary | ICD-10-CM | POA: Diagnosis not present

## 2018-05-05 DIAGNOSIS — I5033 Acute on chronic diastolic (congestive) heart failure: Secondary | ICD-10-CM | POA: Diagnosis not present

## 2018-05-05 DIAGNOSIS — E119 Type 2 diabetes mellitus without complications: Secondary | ICD-10-CM | POA: Diagnosis not present

## 2018-05-05 DIAGNOSIS — I110 Hypertensive heart disease with heart failure: Secondary | ICD-10-CM | POA: Diagnosis not present

## 2018-05-05 DIAGNOSIS — I2510 Atherosclerotic heart disease of native coronary artery without angina pectoris: Secondary | ICD-10-CM | POA: Diagnosis not present

## 2018-05-05 DIAGNOSIS — I480 Paroxysmal atrial fibrillation: Secondary | ICD-10-CM | POA: Diagnosis not present

## 2018-05-05 MED ORDER — SILDENAFIL CITRATE 20 MG PO TABS: 20.0000 mg | ORAL_TABLET | Freq: Three times a day (TID) | ORAL | 3 refills | Status: DC

## 2018-05-06 ENCOUNTER — Telehealth: Payer: Self-pay | Admitting: Cardiovascular Disease

## 2018-05-06 NOTE — Telephone Encounter (Signed)
Follow up:   Patient returning call back. Please call patient back.

## 2018-05-07 DIAGNOSIS — I110 Hypertensive heart disease with heart failure: Secondary | ICD-10-CM | POA: Diagnosis not present

## 2018-05-07 DIAGNOSIS — E119 Type 2 diabetes mellitus without complications: Secondary | ICD-10-CM | POA: Diagnosis not present

## 2018-05-07 DIAGNOSIS — I2510 Atherosclerotic heart disease of native coronary artery without angina pectoris: Secondary | ICD-10-CM | POA: Diagnosis not present

## 2018-05-07 DIAGNOSIS — I480 Paroxysmal atrial fibrillation: Secondary | ICD-10-CM | POA: Diagnosis not present

## 2018-05-07 DIAGNOSIS — I272 Pulmonary hypertension, unspecified: Secondary | ICD-10-CM | POA: Diagnosis not present

## 2018-05-07 DIAGNOSIS — I5033 Acute on chronic diastolic (congestive) heart failure: Secondary | ICD-10-CM | POA: Diagnosis not present

## 2018-05-07 NOTE — Telephone Encounter (Signed)
Follow Up:; ° ° °Returning your call. °

## 2018-05-08 ENCOUNTER — Telehealth: Payer: Self-pay | Admitting: Physician Assistant

## 2018-05-08 NOTE — Telephone Encounter (Signed)
Actually INR was 1.3. Instructed as noted previously.

## 2018-05-08 NOTE — Telephone Encounter (Signed)
Patient can not get sildenafil (REVATIO) 20 MG tablet. It requires prior authorization. Will call out office morning for further instruction.

## 2018-05-08 NOTE — Telephone Encounter (Signed)
Returned call to patient. Results reviewed.

## 2018-05-08 NOTE — Telephone Encounter (Signed)
Paged by home health for INR of 1.9. Takes Coumadin 2.5mg  MWF and 1/2 tablet other days. Instructed to take 5mg  tonight, 2.5mg  Saturday and resume 1/2 Sunday. Recheck INR Monday. HH does not check INR over weekend.

## 2018-05-11 ENCOUNTER — Telehealth: Payer: Self-pay | Admitting: Cardiovascular Disease

## 2018-05-11 DIAGNOSIS — I2510 Atherosclerotic heart disease of native coronary artery without angina pectoris: Secondary | ICD-10-CM | POA: Diagnosis not present

## 2018-05-11 DIAGNOSIS — I110 Hypertensive heart disease with heart failure: Secondary | ICD-10-CM | POA: Diagnosis not present

## 2018-05-11 DIAGNOSIS — I480 Paroxysmal atrial fibrillation: Secondary | ICD-10-CM | POA: Diagnosis not present

## 2018-05-11 DIAGNOSIS — I272 Pulmonary hypertension, unspecified: Secondary | ICD-10-CM | POA: Diagnosis not present

## 2018-05-11 DIAGNOSIS — E119 Type 2 diabetes mellitus without complications: Secondary | ICD-10-CM | POA: Diagnosis not present

## 2018-05-11 DIAGNOSIS — I5033 Acute on chronic diastolic (congestive) heart failure: Secondary | ICD-10-CM | POA: Diagnosis not present

## 2018-05-11 NOTE — Telephone Encounter (Signed)
Prior authorization for sildenafil sent to patient's insurance company via [email or web address]. Awaiting approval.  Key: [id number]

## 2018-05-11 NOTE — Telephone Encounter (Signed)
New Message   Pt c/o medication issue:  1. Name of Medication: sildenafil (REVATIO) 20 MG tablet  2. How are you currently taking this medication (dosage and times per day)?  Take 1 tablet (20 mg total) by mouth 3 (three) times daily.  3. Are you having a reaction (difficulty breathing--STAT)? no  4. What is your medication issue? Pt states Dr. Sallyanne Kuster prescribed this medication but [facility] will not fill it due to needing pre approval. Pt says [employer] has been trying to call with no success in reaching anyone. Please call

## 2018-05-11 NOTE — Telephone Encounter (Signed)
See telephone note from 05/11/18.

## 2018-05-11 NOTE — Telephone Encounter (Signed)
Please advise if you have any paperwork regarding this patients PA. Patient is calling again.  Thank you!

## 2018-05-12 ENCOUNTER — Other Ambulatory Visit: Payer: Self-pay | Admitting: Cardiovascular Disease

## 2018-05-12 NOTE — Telephone Encounter (Signed)
LMTCB

## 2018-05-12 NOTE — Telephone Encounter (Signed)
  Pt c/o medication issue:  1. Name of Medication: sildenafil (REVATIO) 20 MG tablet  2. How are you currently taking this medication (dosage and times per day)? Take 1 tablet (20 mg total) by mouth 3 (three) times daily.  3. Are you having a reaction (difficulty breathing--STAT)?no  4. What is your medication issue? [employer] needs to ask some questions in regards to medication before they can finalize the authorization request

## 2018-05-13 DIAGNOSIS — E119 Type 2 diabetes mellitus without complications: Secondary | ICD-10-CM | POA: Diagnosis not present

## 2018-05-13 DIAGNOSIS — I272 Pulmonary hypertension, unspecified: Secondary | ICD-10-CM | POA: Diagnosis not present

## 2018-05-13 DIAGNOSIS — I480 Paroxysmal atrial fibrillation: Secondary | ICD-10-CM | POA: Diagnosis not present

## 2018-05-13 DIAGNOSIS — I5033 Acute on chronic diastolic (congestive) heart failure: Secondary | ICD-10-CM | POA: Diagnosis not present

## 2018-05-13 DIAGNOSIS — I2510 Atherosclerotic heart disease of native coronary artery without angina pectoris: Secondary | ICD-10-CM | POA: Diagnosis not present

## 2018-05-13 DIAGNOSIS — I110 Hypertensive heart disease with heart failure: Secondary | ICD-10-CM | POA: Diagnosis not present

## 2018-05-14 NOTE — Telephone Encounter (Signed)
  Sildenafil approved 05/11/18-05/12/2019 Auth #[id number]

## 2018-05-14 NOTE — Telephone Encounter (Signed)
Spoke with [employer], they do not need anything at this time.

## 2018-05-19 DIAGNOSIS — I272 Pulmonary hypertension, unspecified: Secondary | ICD-10-CM | POA: Diagnosis not present

## 2018-05-19 DIAGNOSIS — I5033 Acute on chronic diastolic (congestive) heart failure: Secondary | ICD-10-CM | POA: Diagnosis not present

## 2018-05-19 DIAGNOSIS — I110 Hypertensive heart disease with heart failure: Secondary | ICD-10-CM | POA: Diagnosis not present

## 2018-05-19 DIAGNOSIS — E119 Type 2 diabetes mellitus without complications: Secondary | ICD-10-CM | POA: Diagnosis not present

## 2018-05-19 DIAGNOSIS — I2510 Atherosclerotic heart disease of native coronary artery without angina pectoris: Secondary | ICD-10-CM | POA: Diagnosis not present

## 2018-05-19 DIAGNOSIS — I480 Paroxysmal atrial fibrillation: Secondary | ICD-10-CM | POA: Diagnosis not present

## 2018-05-21 ENCOUNTER — Telehealth: Payer: Self-pay | Admitting: Cardiovascular Disease

## 2018-05-21 DIAGNOSIS — E119 Type 2 diabetes mellitus without complications: Secondary | ICD-10-CM | POA: Diagnosis not present

## 2018-05-21 DIAGNOSIS — I272 Pulmonary hypertension, unspecified: Secondary | ICD-10-CM | POA: Diagnosis not present

## 2018-05-21 DIAGNOSIS — I110 Hypertensive heart disease with heart failure: Secondary | ICD-10-CM | POA: Diagnosis not present

## 2018-05-21 DIAGNOSIS — I480 Paroxysmal atrial fibrillation: Secondary | ICD-10-CM | POA: Diagnosis not present

## 2018-05-21 DIAGNOSIS — I5033 Acute on chronic diastolic (congestive) heart failure: Secondary | ICD-10-CM | POA: Diagnosis not present

## 2018-05-21 DIAGNOSIS — I2510 Atherosclerotic heart disease of native coronary artery without angina pectoris: Secondary | ICD-10-CM | POA: Diagnosis not present

## 2018-05-21 MED ORDER — SPIRONOLACTONE 25 MG PO TABS: 25.0000 mg | ORAL_TABLET | Freq: Every day | ORAL | 0 refills | Status: DC

## 2018-05-21 NOTE — Telephone Encounter (Signed)
Spoke with patient and advised to contact PCP regarding Ferrous Sulfate, refilled Aldactone and keep follow up 12/30 as scheduled.

## 2018-05-21 NOTE — Telephone Encounter (Signed)
New Message:     Pt wants to know if she is to continue taking Ferrous and Sprionolactone? Is please call them in for her please. She also would like for you to call her and let her know.      *STAT* If patient is at the pharmacy, call can be transferred to refill team.   1. Which medications need to be refilled? (please list name of each medication and dose if known) New prescriptions for  Ferrous and Spironolactone  2. Which pharmacy/location (including street and city if local pharmacy) is medication to be sent to?[facility] [phone]  3. Do they need a 30 day or 90 day supply? 90 and refills

## 2018-05-25 ENCOUNTER — Other Ambulatory Visit: Payer: Self-pay | Admitting: Internal Medicine

## 2018-05-26 DIAGNOSIS — I480 Paroxysmal atrial fibrillation: Secondary | ICD-10-CM | POA: Diagnosis not present

## 2018-05-26 DIAGNOSIS — I5033 Acute on chronic diastolic (congestive) heart failure: Secondary | ICD-10-CM | POA: Diagnosis not present

## 2018-05-26 DIAGNOSIS — E119 Type 2 diabetes mellitus without complications: Secondary | ICD-10-CM | POA: Diagnosis not present

## 2018-05-26 DIAGNOSIS — I110 Hypertensive heart disease with heart failure: Secondary | ICD-10-CM | POA: Diagnosis not present

## 2018-05-26 DIAGNOSIS — I272 Pulmonary hypertension, unspecified: Secondary | ICD-10-CM | POA: Diagnosis not present

## 2018-05-26 DIAGNOSIS — I2510 Atherosclerotic heart disease of native coronary artery without angina pectoris: Secondary | ICD-10-CM | POA: Diagnosis not present

## 2018-05-28 ENCOUNTER — Telehealth: Payer: Self-pay | Admitting: Emergency Medicine

## 2018-05-28 DIAGNOSIS — I272 Pulmonary hypertension, unspecified: Secondary | ICD-10-CM

## 2018-05-28 NOTE — Telephone Encounter (Signed)
Called and spoke with pt. Stated to pt if she felt comfortable driving herself then that would be okay. Stated to her if she wanted to have someone come with her to the consult that would be fine. Pt expressed understanding.  Pt wanted to confirm address to have the cxr done prior to her consult so I did give her the address to old [place] office building and stated to her the instructions of where she needed to go. Also confirmed with pt our new office address.   I checked to see if order had been placed for cxr and it had not.  Order has been placed for pt to have cxr prior to consult. Nothing further needed.

## 2018-06-01 ENCOUNTER — Ambulatory Visit (INDEPENDENT_AMBULATORY_CARE_PROVIDER_SITE_OTHER): Payer: Medicare Other | Admitting: Emergency Medicine

## 2018-06-01 ENCOUNTER — Encounter: Payer: Self-pay | Admitting: Emergency Medicine

## 2018-06-01 ENCOUNTER — Ambulatory Visit (INDEPENDENT_AMBULATORY_CARE_PROVIDER_SITE_OTHER)
Admission: RE | Admit: 2018-06-01 | Discharge: 2018-06-01 | Disposition: A | Discharge status: Home / Self Care | Payer: Medicare Other | Source: Ambulatory Visit | Attending: Emergency Medicine | Admitting: Emergency Medicine

## 2018-06-01 VITALS — BP 116/76 | HR 70 | Ht 59.0 in | Wt 123.0 lb

## 2018-06-01 DIAGNOSIS — I272 Pulmonary hypertension, unspecified: Secondary | ICD-10-CM

## 2018-06-01 DIAGNOSIS — I2721 Secondary pulmonary arterial hypertension: Secondary | ICD-10-CM | POA: Insufficient documentation

## 2018-06-01 DIAGNOSIS — J90 Pleural effusion, not elsewhere classified: Secondary | ICD-10-CM | POA: Diagnosis not present

## 2018-06-01 DIAGNOSIS — J849 Interstitial pulmonary disease, unspecified: Secondary | ICD-10-CM | POA: Diagnosis not present

## 2018-06-01 DIAGNOSIS — I25119 Atherosclerotic heart disease of native coronary artery with unspecified angina pectoris: Secondary | ICD-10-CM

## 2018-06-01 NOTE — Assessment & Plan Note (Signed)
Based on evaluation to date, her medical history, age her pulmonary hypertension is most associated with her left-sided heart dysfunction.  Her right heart catheterization was suggestive of some increased pulmonary pressures even at improved (if not optimal) volume status.  Overall she is clinically significantly improved with diuresis.  We will need to rule out other potential causes for secondary pulmonary hypertension including airways disease (never smoker), chronic thromboembolic disease (she is on anticoagulation), autoimmune disease.  There was no evidence to support parenchymal lung disease on the CT scan of her chest done during the hospitalization.  She needs pulmonary function testing, V/Q scan, autoimmune labs and we will arrange.  There is some data to support use of sildenafil in this setting.  It may decrease the frequency with which she experiences exacerbations of her diastolic CHF, pleural effusions, volume overload.  She is tolerating the medication without side effects and I would continue it.  No real indication for other targeted PAH therapy beyond oxygen, anticoagulation.  If we determined that there are other underlying contributors then certainly we will approach these as well.  She is interested in pulmonary rehab and I think this is a good idea.  [facility] get [facility] at [facility] which is close to her home.  She has follow-up with Dr. Avon Gully tomorrow

## 2018-06-01 NOTE — Progress Notes (Signed)
Subjective:    Patient ID: Hailey [name], female    DOB: Nov 30, 1945, 72 y.o.   MRN: [id number]  HPI 72 year old never smoker with a significant cardiac history that includes hypertension, atrial fibrillation with ablations, tachybradycardia syndrome with pacemaker, coronary disease treated medically, diabetes, allergic rhinitis.  She was admitted to the hospital in mid November with paroxysmal nocturnal dyspnea, found to have exertional hypoxemia.  CT scan of the chest 04/23/2018 during that hospitalization that I reviewed showed bilateral effusions and some nonspecific nodular changes, question lymphadenopathy.  She was diuresed with some clinical improvement.  A cardiac catheterization showed no new culprit coronary disease, PAOP 19, PAP 68/28 (43), PVR 4.9 [place].  Her diuretics were adjusted and she was weaned off O2. Target wt is 121-123 lbs and she is maintaining. She started sildenafil 05/16/18. At first she had some visual effects, but this has resolved, no HA, no edema. She is on anticoagulation. Interested in [place] rehab.   She has never taken diet medications.  No significant inhaled exposures.  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  CXR today reviewed > R effusion is much improved compared with hospitalization. Still some prominent interstitial markings.     Review of Systems  Constitutional: Positive for unexpected weight change. Negative for fever.  HENT: Negative for congestion, dental problem, ear pain, nosebleeds, postnasal drip, rhinorrhea, sinus pressure, sneezing, sore throat and trouble swallowing.   Eyes: Negative for redness and itching.  Respiratory: Positive for cough and shortness of breath. Negative for chest tightness and wheezing.   Cardiovascular: Negative for palpitations and leg swelling.  Gastrointestinal: Negative for nausea and vomiting.  Genitourinary: Negative for dysuria.  Musculoskeletal: Negative for joint swelling.  Skin: Negative for rash.    Allergic/Immunologic: Negative.  Negative for environmental allergies, food allergies and immunocompromised state.  Neurological: Negative for headaches.  Hematological: Does not bruise/bleed easily.  Psychiatric/Behavioral: Negative for dysphoric mood. The patient is not nervous/anxious.     Past Medical History:  Diagnosis Date  . Acute on chronic diastolic CHF (congestive heart failure), NYHA class 1 ([place]) 04/29/2013  . Allergic rhinitis   . Brady-tachy syndrome (HCC)    s/p STJ dual chamber PPM by Dr Sallyanne Kuster  . CAD (coronary artery disease)    70% RCA stenosis, treated medically  . Diabetes mellitus ([place])   . DJD (degenerative joint disease)   . Dysrhythmia    Paroxysmal Atrial Fib.  . Hypertension   . Hyperthyroidism   . Paroxysmal atrial fibrillation (HCC)   . Polio    as a child  . PONV (postoperative nausea and vomiting)   . Presence of permanent cardiac pacemaker      Family History  Problem Relation Age of Onset  . Heart disease Sister   . Breast cancer Sister   . Breast cancer Sister      Social History   Socioeconomic History  . Marital status: Married    Spouse name: [name]  . Number of children: Not on file  . Years of education: Not on file  . Highest education level: Not on file  Occupational History  . Occupation: housewife  Social Needs  . Financial resource strain: Not on file  . Food insecurity:    Worry: Not on file    Inability: Not on file  . Transportation needs:    Medical: Not on file    Non-medical: Not on file  Tobacco Use  . Smoking status: Never Smoker  . Smokeless tobacco: Never Used  Substance and Sexual Activity  . Alcohol use: No  . Drug use: No  . Sexual activity: Not Currently    Birth control/protection: Abstinence  Lifestyle  . Physical activity:    Days per week: Not on file    Minutes per session: Not on file  . Stress: Not on file  Relationships  . Social connections:    Talks on phone: Not on file    Gets  together: Not on file    Attends religious service: Not on file    Active member of club or organization: Not on file    Attends meetings of clubs or organizations: Not on file    Relationship status: Not on file  . Intimate partner violence:    Fear of current or ex partner: Not on file    Emotionally abused: Not on file    Physically abused: Not on file    Forced sexual activity: Not on file  Other Topics Concern  . Not on file  Social History Narrative   Lives in [place].  Retired.     Allergies  Allergen Reactions  . Codeine Nausea And Vomiting and Nausea Only  . Ketorolac Nausea And Vomiting, Nausea Only and Other (See Comments)    "pt told to never take this med"  . Meperidine Nausea Only  . Demerol Nausea And Vomiting  . Dronedarone Other (See Comments)    Increased fluid retention  . Cyclobenzaprine Nausea And Vomiting     Outpatient Medications Prior to Visit  Medication Sig Dispense Refill  . Cholecalciferol (VITAMIN D3) 1000 UNITS CAPS Take 1 capsule by mouth daily.     . Cyanocobalamin (VITAMIN B12 PO) Take 1 tablet by mouth daily as needed (for energy).     Marland Kitchen DILT-XR 180 MG 24 hr capsule TAKE 1 CAPSULE BY MOUTH ONCE DAILY (Patient taking differently: Take 180 mg by mouth daily. ) 90 capsule 3  . loperamide (IMODIUM A-D) 2 MG tablet Take 2 mg by mouth as needed for diarrhea or loose stools.     . metoprolol tartrate (LOPRESSOR) 50 MG tablet Take 1 tablet (50 mg total) by mouth 2 (two) times daily. 180 tablet 3  . OVER THE COUNTER MEDICATION Take 1 tablet by mouth daily. Claritin    . oxybutynin (DITROPAN) 5 MG tablet Take 5 mg by mouth daily.   4  . pantoprazole (PROTONIX) 40 MG tablet Take 40 mg by mouth daily.    . potassium chloride SA (KLOR-CON M20) 20 MEQ tablet Take 1 tablet (20 mEq total) by mouth daily. (Patient taking differently: Take 10 mEq by mouth daily. ) 90 tablet 3  . PRADAXA 150 MG CAPS capsule TAKE 1 CAPSULE BY MOUTH TWICE DAILY 180 capsule 1    . propylthiouracil (PTU) 50 MG tablet Take 50-100 mg by mouth See admin instructions. Take 100 mg by mouth in the morning and take 50 mg by mouth at bedtime    . saxagliptin HCl (ONGLYZA) 5 MG TABS tablet Take 5 mg by mouth daily.    . sildenafil (REVATIO) 20 MG tablet Take 1 tablet (20 mg total) by mouth 3 (three) times daily. 90 tablet 3  . spironolactone (ALDACTONE) 25 MG tablet Take 1 tablet (25 mg total) by mouth daily. 30 tablet 0  . ferrous sulfate 325 (65 FE) MG tablet Take 1 tablet (325 mg total) by mouth daily with breakfast. 30 tablet 0  . furosemide (LASIX) 80 MG tablet Take 1 tablet (80 mg total)  by mouth daily. 30 tablet 0   No facility-administered medications prior to visit.         Objective:   Physical Exam Vitals:   06/01/18 1027  BP: 116/76  Pulse: 70  SpO2: 98%  Weight: 123 lb (55.8 kg)  Height: 4\' 11"  (1.499 m)   Gen: Pleasant, well-nourished elderly woman, in no distress,  normal affect  ENT: No lesions,  mouth clear,  oropharynx clear, no postnasal drip  Neck: No JVD, no stridor  Lungs: No use of accessory muscles, no crackles or wheezing on normal respiration, no wheeze on forced expiration  Cardiovascular: RRR, heart sounds normal, no murmur or gallops, no peripheral edema  Musculoskeletal: No deformities, no cyanosis or clubbing  Neuro: alert, awake, non focal  Skin: Warm, no lesions or rash       Assessment & Plan:  Secondary pulmonary arterial hypertension (HCC) Based on evaluation to date, her medical history, age her pulmonary hypertension is most associated with her left-sided heart dysfunction.  Her right heart catheterization was suggestive of some increased pulmonary pressures even at improved (if not optimal) volume status.  Overall she is clinically significantly improved with diuresis.  We will need to rule out other potential causes for secondary pulmonary hypertension including airways disease (never smoker), chronic  thromboembolic disease (she is on anticoagulation), autoimmune disease.  There was no evidence to support parenchymal lung disease on the CT scan of her chest done during the hospitalization.  She needs pulmonary function testing, V/Q scan, autoimmune labs and we will arrange.  There is some data to support use of sildenafil in this setting.  It may decrease the frequency with which she experiences exacerbations of her diastolic CHF, pleural effusions, volume overload.  She is tolerating the medication without side effects and I would continue it.  No real indication for other targeted PAH therapy beyond oxygen, anticoagulation.  If we determined that there are other underlying contributors then certainly we will approach these as well.  She is interested in pulmonary rehab and I think this is a good idea.  She can get this at [facility] which is close to her home.  She has follow-up with Dr. Avon Gully tomorrow   Baltazar Apo, MD, PhD 06/01/2018, 1:39 PM [facility] [phone] or if no answer [phone]

## 2018-06-01 NOTE — Patient Instructions (Signed)
Please continue blood pressure and diuretic medicines as directed by Dr. Cleda Mccreedy We will continue sildenafil 20 mg 3 times a day We will check a ventilation/perfusion scan, pulmonary function testing and lab work to look for other potential contributors to pulmonary hypertension. We will make a referral to cardiopulmonary rehab at [facility] Follow with Dr Lamonte Sakai next available with PFT so that we can review the results together

## 2018-06-02 ENCOUNTER — Encounter: Payer: Self-pay | Admitting: Cardiovascular Disease

## 2018-06-02 ENCOUNTER — Ambulatory Visit (INDEPENDENT_AMBULATORY_CARE_PROVIDER_SITE_OTHER): Payer: Medicare Other | Admitting: Cardiovascular Disease

## 2018-06-02 VITALS — BP 109/67 | HR 91 | Ht 59.0 in | Wt 124.8 lb

## 2018-06-02 DIAGNOSIS — I5032 Chronic diastolic (congestive) heart failure: Secondary | ICD-10-CM | POA: Diagnosis not present

## 2018-06-02 DIAGNOSIS — I441 Atrioventricular block, second degree: Secondary | ICD-10-CM

## 2018-06-02 DIAGNOSIS — I25119 Atherosclerotic heart disease of native coronary artery with unspecified angina pectoris: Secondary | ICD-10-CM

## 2018-06-02 DIAGNOSIS — I495 Sick sinus syndrome: Secondary | ICD-10-CM

## 2018-06-02 DIAGNOSIS — I4819 Other persistent atrial fibrillation: Secondary | ICD-10-CM | POA: Diagnosis not present

## 2018-06-02 DIAGNOSIS — Z7901 Long term (current) use of anticoagulants: Secondary | ICD-10-CM

## 2018-06-02 DIAGNOSIS — Z950 Presence of cardiac pacemaker: Secondary | ICD-10-CM

## 2018-06-02 DIAGNOSIS — I2721 Secondary pulmonary arterial hypertension: Secondary | ICD-10-CM

## 2018-06-02 DIAGNOSIS — I2510 Atherosclerotic heart disease of native coronary artery without angina pectoris: Secondary | ICD-10-CM

## 2018-06-02 LAB — ANGIOTENSIN CONVERTING ENZYME: Angiotensin-Converting Enzyme: 21 U/L (ref 9–67)

## 2018-06-02 LAB — ANTI-NUCLEAR AB-TITER (ANA TITER): ANA Titer 1: 1:640 {titer} — ABNORMAL HIGH

## 2018-06-02 LAB — RNP ANTIBODIES

## 2018-06-02 LAB — RHEUMATOID FACTOR: Rheumatoid fact SerPl-aCnc: <14 IU/mL (ref <14)

## 2018-06-02 LAB — ANTI-SCLERODERMA ANTIBODY: Scleroderma (Scl-70) (ENA) Antibody, IgG: <1 AI

## 2018-06-02 LAB — ANA: Anti Nuclear Antibody(ANA): POSITIVE — AB

## 2018-06-02 NOTE — Patient Instructions (Signed)
Medication Instructions:  Dr Sallyanne Kuster recommends that you continue on your current medications as directed. Please refer to the Current Medication list given to you today.  If you need a refill on your cardiac medications before your next appointment, please call your pharmacy.   Follow-Up: At [facility], you and your health needs are our priority.  As part of our continuing mission to provide you with exceptional heart care, we have created designated Provider Care Teams.  These Care Teams include your primary Cardiologist (physician) and Advanced Practice Providers (APPs -  Physician Assistants and Nurse Practitioners) who all work together to provide you with the care you need, when you need it. You will need a follow up appointment in 3-4 months.  Please call our office 2 months in advance to schedule this appointment.  You may see Sanda Klein, MD or one of the following Advanced Practice Providers on your designated Care Team: [place], [place] . Fabian Sharp, PA-C

## 2018-06-03 NOTE — Progress Notes (Signed)
Patient ID: Hailey [name], female   DOB: 02/16/1946, 73 y.o.   MRN: [id number]    Cardiology Office Note    Date:  06/03/2018   ID:  Hailey, [name] 05-Feb-1946, MRN [id number]  PCP:  Jani Gravel, MD  Cardiologist:  Thompson Grayer, MD;  Sanda Klein, MD   No chief complaint on file.   History of Present Illness:  Hailey [name] is a 73 y.o. female with paroxysmal atrial fibrillation (s/p RF ablation procedures),s/p dual-chamber permanent pacemaker ([employer]. Jude Accent 2012 for tachycardia-bradycardia syndrome and 2:1 atrioventricular block), CAD (nonobstructive by cath February 2019), chronic diastolic heart failure (history of acute decompensation on Multaq), multifactorial pulmonary artery hypertension, type 2 diabetes mellitus, hypertension, good lipid profile without therapy, hyperthyroidism (on propylthiouracil).  She was hospitalized in December with pneumonia and was found to have severe and worsening pulmonary artery hypertension.  Although there was some evidence of diastolic heart failure, the degree of elevation in pulmonary artery pressure was markedly out of proportion with the elevation in pulmonary wedge pressure.  Chest CT did not show any evidence of major parenchymal abnormalities.  She saw Dr. Lamonte Sakai in clinic recently and he has ordered pulmonary function tests and a VQ scan.  Note that she is chronically anticoagulated and has been for years.  Is on treatment with sildenafil 20 mg 3 times daily, recently initiated.  He is scheduled for repeat echocardiogram on January 21 to reevaluate the pulmonary artery pressure.  Her breathing has improved substantially as hospital discharge, although she has not returned to her previous baseline of NYHA functional class II.  She is able to perform light housework.  Denies angina and has not had dizziness or syncope.  She remains compliant with Pradaxa and has not had falls, injuries, bleeding problems or embolic events.  Pacemaker  interrogation today shows that she has been in atrial fibrillation without interruption for the last 3 weeks, since December 16.  Previously had a pretty stable prevalence of paroxysmal atrial fibrillation about 10-15% of the time, always asymptomatic.  Otherwise she is almost always atrially paced.  Ventricular pacing occurs roughly 1/3 of the time.  Device function is normal.  She has mild chronic elevation in bipolar RV pacing thresholds and ventricular pacing is programmed unipolar.Her pacemaker is a Buyer, retail implanted in 2012.  Estimated device longevity is about 1.5 years.  She has 79% atrial pacing, atrial fibrillation 13%, 37% ventricular pacing.  Atrial lead parameters are all excellent.  As mentioned the ventricular lead threshold is high in the bipolar configuration, but is acceptable in unipolar configuration.  R wave sensing and ventricular lead impedance remain normal and stable.  Cybersecurity upgrade was performed today June 02, 2018.    Past Medical History:  Diagnosis Date  . Acute on chronic diastolic CHF (congestive heart failure), NYHA class 1 ([place]) 04/29/2013  . Allergic rhinitis   . Brady-tachy syndrome (HCC)    s/p STJ dual chamber PPM by Dr Sallyanne Kuster  . CAD (coronary artery disease)    70% RCA stenosis, treated medically  . Diabetes mellitus ([place])   . DJD (degenerative joint disease)   . Dysrhythmia    Paroxysmal Atrial Fib.  . Hypertension   . Hyperthyroidism   . Paroxysmal atrial fibrillation (HCC)   . Polio    as a child  . PONV (postoperative nausea and vomiting)   . Presence of permanent cardiac pacemaker     Past Surgical History:  Procedure Laterality Date  .  arm surgery d/t fx Right 96  . ATRIAL FIBRILLATION ABLATION  07/31/2012; 08-10-13   PVI by Dr Rayann Heman  . ATRIAL FIBRILLATION ABLATION N/A 07/31/2012   Procedure: ATRIAL FIBRILLATION ABLATION;  Surgeon: Thompson Grayer, MD;  Location: [facility] CATH LAB;  Service: Cardiovascular;  Laterality: N/A;  .  ATRIAL FIBRILLATION ABLATION N/A 08/10/2013   Procedure: ATRIAL FIBRILLATION ABLATION;  Surgeon: Coralyn Mark, MD;  Location: [place] CATH LAB;  Service: Cardiovascular;  Laterality: N/A;  . CARDIAC CATHETERIZATION  12/03/2010   single vessel mid RCA  . CHOLECYSTECTOMY  1994  . COLONOSCOPY WITH PROPOFOL N/A 03/21/2015   Procedure: COLONOSCOPY WITH PROPOFOL;  Surgeon: Juanita Craver, MD;  Location: [facility] ENDOSCOPY;  Service: Endoscopy;  Laterality: N/A;  . LEFT HEART CATH AND CORONARY ANGIOGRAPHY N/A 07/30/2017   Procedure: LEFT HEART CATH AND CORONARY ANGIOGRAPHY;  Surgeon: Troy Sine, MD;  Location: [place] CV LAB;  Service: Cardiovascular;  Laterality: N/A;  . LUMBAR LAMINECTOMY/DECOMPRESSION MICRODISCECTOMY Right 12/23/2012   Procedure: Right L5-S1 Laminotomy for resection of synovial cyst;  Surgeon: Hosie Spangle, MD;  Location: [facility] NEURO ORS;  Service: Neurosurgery;  Laterality: Right;  Right Lumbar five-sacral one Laminotomy for resection of synovial cyst  . NM MYOCAR PERF WALL MOTION  12/20/2010   normal  . PACEMAKER INSERTION  12/03/10   SJM Accent DR RF implanted by Dr Sallyanne Kuster  . RIGHT HEART CATH N/A 04/22/2018   Procedure: RIGHT HEART CATH;  Surgeon: Wellington Hampshire, MD;  Location: [place] CV LAB;  Service: Cardiovascular;  Laterality: N/A;  . TEE WITHOUT CARDIOVERSION N/A 07/30/2012   Procedure: TRANSESOPHAGEAL ECHOCARDIOGRAM (TEE);  Surgeon: Sanda Klein, MD;  Location: [facility] ENDOSCOPY;  Service: Cardiovascular;  Laterality: N/A;  h&p in file-[place]  . TEE WITHOUT CARDIOVERSION N/A 08/09/2013   Procedure: TRANSESOPHAGEAL ECHOCARDIOGRAM (TEE);  Surgeon: Lelon Perla, MD;  Location: [place];  Service: Cardiovascular;  Laterality: N/A;  . TOTAL ABDOMINAL HYSTERECTOMY  1981  . ULTRASOUND GUIDANCE FOR VASCULAR ACCESS  04/22/2018   Procedure: Ultrasound Guidance For Vascular Access;  Surgeon: Wellington Hampshire, MD;  Location: [place] CV LAB;  Service: Cardiovascular;;     Current Medications: Outpatient Medications Prior to Visit  Medication Sig Dispense Refill  . Cholecalciferol (VITAMIN D3) 1000 UNITS CAPS Take 1 capsule by mouth daily.     . Cyanocobalamin (VITAMIN B12 PO) Take 1 tablet by mouth daily as needed (for energy).     Marland Kitchen DILT-XR 180 MG 24 hr capsule TAKE 1 CAPSULE BY MOUTH ONCE DAILY (Patient taking differently: Take 180 mg by mouth daily. ) 90 capsule 3  . ferrous sulfate 325 (65 FE) MG tablet Take 1 tablet (325 mg total) by mouth daily with breakfast. 30 tablet 0  . furosemide (LASIX) 80 MG tablet Take 1 tablet (80 mg total) by mouth daily. 30 tablet 0  . guaiFENesin (MUCINEX) 600 MG 12 hr tablet Take 600 mg by mouth daily.    Marland Kitchen loperamide (IMODIUM A-D) 2 MG tablet Take 2 mg by mouth as needed for diarrhea or loose stools.     . metoprolol tartrate (LOPRESSOR) 50 MG tablet Take 1 tablet (50 mg total) by mouth 2 (two) times daily. 180 tablet 3  . OVER THE COUNTER MEDICATION Take 1 tablet by mouth daily. Claritin    . oxybutynin (DITROPAN) 5 MG tablet Take 5 mg by mouth daily.   4  . pantoprazole (PROTONIX) 40 MG tablet Take 40 mg by mouth daily.    Marland Kitchen  potassium chloride SA (KLOR-CON M20) 20 MEQ tablet Take 1 tablet (20 mEq total) by mouth daily. (Patient taking differently: Take 10 mEq by mouth daily. ) 90 tablet 3  . PRADAXA 150 MG CAPS capsule TAKE 1 CAPSULE BY MOUTH TWICE DAILY 180 capsule 1  . propylthiouracil (PTU) 50 MG tablet Take 50-100 mg by mouth See admin instructions. Take 100 mg by mouth in the morning and take 50 mg by mouth at bedtime    . saxagliptin HCl (ONGLYZA) 5 MG TABS tablet Take 5 mg by mouth daily.    . sildenafil (REVATIO) 20 MG tablet Take 1 tablet (20 mg total) by mouth 3 (three) times daily. 90 tablet 3  . spironolactone (ALDACTONE) 25 MG tablet Take 1 tablet (25 mg total) by mouth daily. 30 tablet 0   No facility-administered medications prior to visit.      Allergies:   Codeine; Ketorolac; Meperidine; Demerol;  Dronedarone; and Cyclobenzaprine   Social History   Socioeconomic History  . Marital status: Married    Spouse name: Hailey [name]  . Number of children: Not on file  . Years of education: Not on file  . Highest education level: Not on file  Occupational History  . Occupation: housewife  Social Needs  . Financial resource strain: Not on file  . Food insecurity:    Worry: Not on file    Inability: Not on file  . Transportation needs:    Medical: Not on file    Non-medical: Not on file  Tobacco Use  . Smoking status: Never Smoker  . Smokeless tobacco: Never Used  Substance and Sexual Activity  . Alcohol use: No  . Drug use: No  . Sexual activity: Not Currently    Birth control/protection: Abstinence  Lifestyle  . Physical activity:    Days per week: Not on file    Minutes per session: Not on file  . Stress: Not on file  Relationships  . Social connections:    Talks on phone: Not on file    Gets together: Not on file    Attends religious service: Not on file    Active member of club or organization: Not on file    Attends meetings of clubs or organizations: Not on file    Relationship status: Not on file  Other Topics Concern  . Not on file  Social History Narrative   Lives in [place].  Retired.     Family History:  The patient's family history includes Breast cancer in her sister and sister; Heart disease in her sister.   ROS:   Please see the history of present illness.    ROS all other systems are reviewed and are negative   PHYSICAL EXAM:   VS:  BP 109/67   Pulse 91   Ht 4\' 11"  (1.499 m)   Wt 124 lb 12.8 oz (56.6 kg)   BMI 25.21 kg/m      General: Alert, oriented x3, no distress, healthy device site Head: no evidence of trauma, PERRL, EOMI, no exophtalmos or lid lag, no myxedema, no xanthelasma; normal ears, nose and oropharynx Neck: normal jugular venous pulsations and no hepatojugular reflux; brisk carotid pulses without delay and no carotid  bruits Chest: clear to auscultation, no signs of consolidation by percussion or palpation, normal fremitus, symmetrical and full respiratory excursions Cardiovascular: normal position and quality of the apical impulse, regular rhythm, normal first and paradoxically split second heart sounds, no murmurs, rubs or gallops Abdomen: no tenderness or  distention, no masses by palpation, no abnormal pulsatility or arterial bruits, normal bowel sounds, no hepatosplenomegaly Extremities: no clubbing, cyanosis or edema; 2+ radial, ulnar and brachial pulses bilaterally; 2+ right femoral, posterior tibial and dorsalis pedis pulses; 2+ left femoral, posterior tibial and dorsalis pedis pulses; no subclavian or femoral bruits Neurological: grossly nonfocal Psych: Normal mood and affect   Wt Readings from Last 3 Encounters:  06/02/18 124 lb 12.8 oz (56.6 kg)  06/01/18 123 lb (55.8 kg)  04/25/18 125 lb 9.6 oz (57 kg)      Studies/Labs Reviewed:       Right heart catheterization April 22, 2018 1. Mildly elevated left-sided filling pressures. 2. Moderate to severe pulmonary hypertension with normal cardiac output.  Pulmonary capillary wedge pressure: 19 mmHg. PA pressure: 68/28 with a mean of 43 mmHg. Pulmonary vascular resistance: 4.9 Woods units Cardiac index was 2.97.  Recommendations: The patient seems to have mixed pulmonary hypertension due to left-sided heart failure and primary pulmonary hypertension. She is mildly volume overloaded and I suspect that she can switch to oral furosemide tomorrow. Consider vasodilator therapy for pulmonary hypertension.  Left heart catheterization July 30, 2017   Ost 1st Diag to 1st Diag lesion is 10% stenosed.  Mid RCA lesion is 20% stenosed.  The left ventricular systolic function is normal.  LV end diastolic pressure is normal.  No significant coronary obstructive disease with mild smooth 10% narrowing in the proximal to midportion of  the first diagonal branch of the LAD; normal left circumflex coronary artery; and smooth 20% mid RCA narrowing.  Normal LV function without focal segmental wall motion abnormalities. LVEDP 7.  RECOMMENDATION: Medical therapy.  Echo April 20, 2018   - Left ventricle: The cavity size was normal. Systolic function was normal. The estimated ejection fraction was in the range of 55% to 60%. Wall motion was normal; there were no regional wall motion abnormalities. There was a reduced contribution of atrial contraction to ventricular filling, due to increased ventricular diastolic pressure or atrial contractile dysfunction. Features are consistent with a pseudonormal left ventricular filling pattern, with concomitant abnormal relaxation and increased filling pressure (grade 2 diastolic dysfunction). - Left atrium: The atrium was mildly dilated. - Right ventricle: The cavity size was mildly dilated. Wall thickness was normal. - Right atrium: The atrium was mildly dilated. - Tricuspid valve: There was mild-moderate regurgitation directed centrally. - Pulmonary arteries: Systolic pressure was moderately to severely increased. PA peak pressure: 66 mm Hg (S).  Impressions:  - PA pressure has increased since the previous study.      EKG:  EKG is not ordered today.  The intracardiac electrogram shows atrial fibrillation and ventricular pacing.  Lipid Panel    Component Value Date/Time   CHOL 94 11/24/2010 0837   TRIG 51 11/24/2010 0837   HDL 53 11/24/2010 0837   CHOLHDL 1.8 11/24/2010 0837   VLDL 10 11/24/2010 0837   LDLCALC 31 11/24/2010 0837    ASSESSMENT:    1. Chronic diastolic heart failure ([place])   2. PAH (pulmonary artery hypertension) ([place])   3. Other persistent atrial fibrillation   4. Long term current use of anticoagulant   5. SSS (sick sinus syndrome) (HCC)   6. Second degree AV block   7. Pacemaker   8. Coronary artery disease  involving native coronary artery of native heart without angina pectoris      PLAN:  In order of problems listed above:   1. CHF: As far as I can tell  she is euvolemic on exam.  NYHA functional class 2-3.  Do not think she requires more diuretics.  Residual problems likely related to pulmonary artery hypertension. 2. PAH: Etiology likely multifactorial.  There is some contribution of diastolic heart failure.  There was also probably some acute worsening during respiratory infection in December.  Thromboembolic disease appears unlikely since she has been anticoagulated for years for atrial fibrillation.  Scheduled for VQ scan and pulmonary function tests in the next few days and seeing Dr. Lamonte Sakai.  Started treatment with sildenafil and will reevaluate the PA pressure on echo January 21. 3. AFib: There has never been an obvious correlation between the arrhythmia and symptoms.  He remains completely unaware of the arrhythmia today.  Rate control is good.  She is anticoagulated.  CHADSVasc at least 5 (age, gender, DM, CAD, history of CHF). 4. Anticoagulation: Well-tolerated without bleeding complications. 5. SSS: She has close to 100% atrial pacing when she is not in atrial fibrillation, and atrial heart rate histogram distribution appears to be favorable. 6. 2nd deg AVB: She felt very poorly when she lost ventricular capture.  Better after we switched the ventricular pacing configuration to unipolar. 7. PPM: We may have to revise her ventricular lead at the time of generator change out anticipated in the next 1.5 years or so. 8. CAD: Denies angina pectoris.  Nonobstructive disease by cath in February of this year.   Medication Adjustments/Labs and Tests Ordered: Current medicines are reviewed at length with the patient today.  Concerns regarding medicines are outlined above.  Medication changes, Labs and Tests ordered today are listed in the Patient Instructions below. Patient Instructions    Medication Instructions:  Dr Sallyanne Kuster recommends that you continue on your current medications as directed. Please refer to the Current Medication list given to you today.  If you need a refill on your cardiac medications before your next appointment, please call your pharmacy.   Follow-Up: At [facility], you and your health needs are our priority.  As part of our continuing mission to provide you with exceptional heart care, we have created designated Provider Care Teams.  These Care Teams include your primary Cardiologist (physician) and Advanced Practice Providers (APPs -  Physician Assistants and Nurse Practitioners) who all work together to provide you with the care you need, when you need it. You will need a follow up appointment in 3-4 months.  Please call our office 2 months in advance to schedule this appointment.  You may see Sanda Klein, MD or one of the following Advanced Practice Providers on your designated Care Team: [place], [place] . Fabian Sharp, PA-C    Signed, Sanda Klein, MD  06/03/2018 11:49 AM    [facility] [address], [place], [place]  [address] Phone: [phone]; Fax: [phone]

## 2018-06-04 DIAGNOSIS — I272 Pulmonary hypertension, unspecified: Secondary | ICD-10-CM | POA: Diagnosis not present

## 2018-06-04 DIAGNOSIS — I5033 Acute on chronic diastolic (congestive) heart failure: Secondary | ICD-10-CM | POA: Diagnosis not present

## 2018-06-04 DIAGNOSIS — E119 Type 2 diabetes mellitus without complications: Secondary | ICD-10-CM | POA: Diagnosis not present

## 2018-06-04 DIAGNOSIS — I480 Paroxysmal atrial fibrillation: Secondary | ICD-10-CM | POA: Diagnosis not present

## 2018-06-04 DIAGNOSIS — I110 Hypertensive heart disease with heart failure: Secondary | ICD-10-CM | POA: Diagnosis not present

## 2018-06-04 DIAGNOSIS — I2510 Atherosclerotic heart disease of native coronary artery without angina pectoris: Secondary | ICD-10-CM | POA: Diagnosis not present

## 2018-06-07 LAB — CUP PACEART REMOTE DEVICE CHECK
Battery Remaining Longevity: 19 mo
Battery Remaining Percentage: 22 %
Battery Voltage: 2.78 V
Brady Statistic AP VP Percent: 22 %
Brady Statistic AP VS Percent: 78 %
Brady Statistic AS VP Percent: 1 %
Brady Statistic AS VS Percent: 1 %
Brady Statistic RA Percent Paced: 85 %
Brady Statistic RV Percent Paced: 33 %
Date Time Interrogation Session: 20191104060009
Implantable Lead Implant Date: 20120702
Implantable Lead Implant Date: 20120702
Implantable Lead Location: 753859
Implantable Lead Location: 753860
Lead Channel Impedance Value: 240 Ohm
Lead Channel Pacing Threshold Amplitude: 0.75 V
Lead Channel Pacing Threshold Amplitude: 1.25 V
Lead Channel Pacing Threshold Pulse Width: 0.4 ms
Lead Channel Pacing Threshold Pulse Width: 0.8 ms
Lead Channel Sensing Intrinsic Amplitude: 2.2 mV
Lead Channel Sensing Intrinsic Amplitude: 4.6 mV
Lead Channel Setting Pacing Amplitude: 1.75 V
Lead Channel Setting Pacing Amplitude: 2.5 V
Lead Channel Setting Pacing Pulse Width: 0.8 ms
Lead Channel Setting Sensing Sensitivity: 2 mV
MDC IDC MSMT LEADCHNL RA IMPEDANCE VALUE: 380 Ohm
MDC IDC PG IMPLANT DT: 20120702
Pulse Gen Model: 2210
Pulse Gen Serial Number: 7255453

## 2018-06-08 ENCOUNTER — Ambulatory Visit (HOSPITAL_COMMUNITY)
Admission: RE | Admit: 2018-06-08 | Discharge: 2018-06-08 | Disposition: A | Discharge status: Home / Self Care | Payer: Medicare Other | Source: Ambulatory Visit | Attending: Emergency Medicine | Admitting: Emergency Medicine

## 2018-06-08 ENCOUNTER — Encounter (HOSPITAL_COMMUNITY)
Admission: RE | Admit: 2018-06-08 | Discharge: 2018-06-08 | Disposition: A | Discharge status: Home / Self Care | Payer: Medicare Other | Source: Ambulatory Visit | Attending: Emergency Medicine | Admitting: Emergency Medicine

## 2018-06-08 DIAGNOSIS — I272 Pulmonary hypertension, unspecified: Secondary | ICD-10-CM | POA: Diagnosis not present

## 2018-06-08 DIAGNOSIS — I270 Primary pulmonary hypertension: Secondary | ICD-10-CM | POA: Diagnosis not present

## 2018-06-08 DIAGNOSIS — R0602 Shortness of breath: Secondary | ICD-10-CM | POA: Diagnosis not present

## 2018-06-08 MED ORDER — TECHNETIUM TO 99M ALBUMIN AGGREGATED
4.1000 | Freq: Once | INTRAVENOUS | Status: AC | PRN
  Administered 2018-06-08: 4.1 via INTRAVENOUS

## 2018-06-08 MED ORDER — TECHNETIUM TC 99M DIETHYLENETRIAME-PENTAACETIC ACID
30.0000 | Freq: Once | INTRAVENOUS | Status: AC | PRN
  Administered 2018-06-08: 30 via RESPIRATORY_TRACT

## 2018-06-16 ENCOUNTER — Other Ambulatory Visit: Payer: Self-pay | Admitting: Cardiovascular Disease

## 2018-06-22 DIAGNOSIS — R05 Cough: Secondary | ICD-10-CM | POA: Diagnosis not present

## 2018-06-23 ENCOUNTER — Other Ambulatory Visit: Payer: Self-pay

## 2018-06-23 ENCOUNTER — Ambulatory Visit (HOSPITAL_COMMUNITY): Payer: Medicare Other | Attending: Cardiovascular Disease

## 2018-06-23 VITALS — BP 119/83

## 2018-06-23 DIAGNOSIS — I272 Pulmonary hypertension, unspecified: Secondary | ICD-10-CM | POA: Insufficient documentation

## 2018-06-23 MED ORDER — PERFLUTREN LIPID MICROSPHERE
1.0000 mL | INTRAVENOUS | Status: AC | PRN
  Administered 2018-06-23: 1 mL via INTRAVENOUS

## 2018-06-25 ENCOUNTER — Telehealth: Payer: Self-pay

## 2018-06-25 ENCOUNTER — Other Ambulatory Visit: Payer: Self-pay

## 2018-06-25 DIAGNOSIS — I2510 Atherosclerotic heart disease of native coronary artery without angina pectoris: Secondary | ICD-10-CM | POA: Diagnosis not present

## 2018-06-25 DIAGNOSIS — I5033 Acute on chronic diastolic (congestive) heart failure: Secondary | ICD-10-CM | POA: Diagnosis not present

## 2018-06-25 DIAGNOSIS — I110 Hypertensive heart disease with heart failure: Secondary | ICD-10-CM | POA: Diagnosis not present

## 2018-06-25 DIAGNOSIS — E119 Type 2 diabetes mellitus without complications: Secondary | ICD-10-CM | POA: Diagnosis not present

## 2018-06-25 MED ORDER — FUROSEMIDE 80 MG PO TABS: 80.0000 mg | ORAL_TABLET | Freq: Every day | ORAL | 0 refills | Status: DC

## 2018-06-25 NOTE — Telephone Encounter (Signed)
Patient made aware of echo results with verbalized understanding. 

## 2018-06-25 NOTE — Telephone Encounter (Signed)
-----   Message from Sanda Klein, MD sent at 06/24/2018  8:17 AM EST ----- Pulmonary artery pressures are better

## 2018-06-29 DIAGNOSIS — E119 Type 2 diabetes mellitus without complications: Secondary | ICD-10-CM | POA: Diagnosis not present

## 2018-06-29 DIAGNOSIS — I2510 Atherosclerotic heart disease of native coronary artery without angina pectoris: Secondary | ICD-10-CM | POA: Diagnosis not present

## 2018-06-29 DIAGNOSIS — I5033 Acute on chronic diastolic (congestive) heart failure: Secondary | ICD-10-CM | POA: Diagnosis not present

## 2018-06-29 DIAGNOSIS — I110 Hypertensive heart disease with heart failure: Secondary | ICD-10-CM | POA: Diagnosis not present

## 2018-07-06 ENCOUNTER — Ambulatory Visit (INDEPENDENT_AMBULATORY_CARE_PROVIDER_SITE_OTHER): Payer: Medicare Other

## 2018-07-06 DIAGNOSIS — I480 Paroxysmal atrial fibrillation: Secondary | ICD-10-CM

## 2018-07-07 LAB — CUP PACEART INCLINIC DEVICE CHECK
Date Time Interrogation Session: 20200204141006
Implantable Lead Implant Date: 20120702
Implantable Lead Location: 753859
Implantable Lead Location: 753860
Implantable Pulse Generator Implant Date: 20120702
MDC IDC LEAD IMPLANT DT: 20120702
MDC IDC PG SERIAL: 7255453
Pulse Gen Model: 2210

## 2018-07-08 ENCOUNTER — Encounter (HOSPITAL_COMMUNITY): Payer: Medicare Other

## 2018-07-09 ENCOUNTER — Ambulatory Visit (INDEPENDENT_AMBULATORY_CARE_PROVIDER_SITE_OTHER): Payer: Medicare Other | Admitting: Emergency Medicine

## 2018-07-09 ENCOUNTER — Encounter: Payer: Self-pay | Admitting: Emergency Medicine

## 2018-07-09 VITALS — BP 130/62 | HR 74 | Ht 59.0 in | Wt 124.0 lb

## 2018-07-09 DIAGNOSIS — R768 Other specified abnormal immunological findings in serum: Secondary | ICD-10-CM | POA: Diagnosis not present

## 2018-07-09 DIAGNOSIS — R911 Solitary pulmonary nodule: Secondary | ICD-10-CM

## 2018-07-09 DIAGNOSIS — I2721 Secondary pulmonary arterial hypertension: Secondary | ICD-10-CM | POA: Diagnosis not present

## 2018-07-09 DIAGNOSIS — I272 Pulmonary hypertension, unspecified: Secondary | ICD-10-CM | POA: Diagnosis not present

## 2018-07-09 LAB — CUP PACEART REMOTE DEVICE CHECK
Battery Remaining Longevity: 16 mo
Battery Remaining Percentage: 17 %
Battery Voltage: 2.75 V
Brady Statistic AP VP Percent: 23 %
Brady Statistic AS VP Percent: 1 %
Brady Statistic AS VS Percent: 1 %
Brady Statistic RA Percent Paced: 74 %
Brady Statistic RV Percent Paced: 43 %
Date Time Interrogation Session: 20200203070007
Implantable Lead Implant Date: 20120702
Implantable Lead Implant Date: 20120702
Implantable Lead Location: 753859
Implantable Lead Location: 753860
Implantable Pulse Generator Implant Date: 20120702
Lead Channel Impedance Value: 300 Ohm
Lead Channel Impedance Value: 410 Ohm
Lead Channel Pacing Threshold Amplitude: 0.625 V
Lead Channel Pacing Threshold Amplitude: 0.75 V
Lead Channel Pacing Threshold Pulse Width: 0.4 ms
Lead Channel Pacing Threshold Pulse Width: 0.8 ms
Lead Channel Sensing Intrinsic Amplitude: 2.7 mV
Lead Channel Sensing Intrinsic Amplitude: 4.6 mV
Lead Channel Setting Pacing Amplitude: 1.625
Lead Channel Setting Pacing Amplitude: 2.5 V
Lead Channel Setting Sensing Sensitivity: 2 mV
MDC IDC PG SERIAL: 7255453
MDC IDC SET LEADCHNL RV PACING PULSEWIDTH: 0.8 ms
MDC IDC STAT BRADY AP VS PERCENT: 77 %
Pulse Gen Model: 2210

## 2018-07-09 LAB — PULMONARY FUNCTION TEST
DL/VA % PRED: 92 %
DL/VA: 3.94 ml/min/mmHg/L
DLCO unc % pred: 81 %
DLCO unc: 13.18 ml/min/mmHg
FEF 25-75 Post: 1.7 L/sec
FEF 25-75 Pre: 1.36 L/sec
FEF2575-%Change-Post: 25 %
FEF2575-%PRED-POST: 115 %
FEF2575-%Pred-Pre: 92 %
FEV1-%Change-Post: 4 %
FEV1-%Pred-Post: 87 %
FEV1-%Pred-Pre: 83 %
FEV1-Post: 1.51 L
FEV1-Pre: 1.44 L
FEV1FVC-%Change-Post: 0 %
FEV1FVC-%Pred-Pre: 106 %
FEV6-%Change-Post: 4 %
FEV6-%Pred-Post: 85 %
FEV6-%Pred-Pre: 82 %
FEV6-Post: 1.87 L
FEV6-Pre: 1.8 L
FEV6FVC-%PRED-POST: 105 %
FEV6FVC-%Pred-Pre: 105 %
FVC-%Change-Post: 4 %
FVC-%Pred-Post: 81 %
FVC-%Pred-Pre: 78 %
FVC-Post: 1.87 L
FVC-Pre: 1.8 L
Post FEV1/FVC ratio: 80 %
Post FEV6/FVC ratio: 100 %
Pre FEV1/FVC ratio: 80 %
Pre FEV6/FVC Ratio: 100 %
RV % pred: 100 %
RV: 2.01 L
TLC % pred: 87 %
TLC: 3.78 L

## 2018-07-09 NOTE — Patient Instructions (Addendum)
We will plan to repeat your CT chest in June 2020 to follow fluid and pulmonary nodules.  Your pulmonary function testing today does not show any significant abnormality.  Possibly some very mild restriction on the size of each breath that you take. Ventilation perfusion scan from January does not show any evidence of blood clots in the chest.  This is good news. We will send you to see Rheumatology to discuss your ANA test, determine whether it is relevant or affecting your health.  Please continue your current medications including your sildenafil as you are taking them  Follow with Dr Lamonte Sakai in 4 months or sooner if you have any problems.

## 2018-07-09 NOTE — Progress Notes (Signed)
PFT done today. 

## 2018-07-09 NOTE — Assessment & Plan Note (Signed)
Based on the evaluation thus far left-sided heart disease likely contributing the most to her PAH.  She has a positive ANA of unclear significance, titer 1: 640.  She denies any signs of myopathy, arthropathy, rash.  Sildenafil can be beneficial in this setting, decrease the frequency with which patient's have acute exacerbations of their CHF.  She is tolerating well and I think we should continue it.  No current indication to start adjunct PAH therapy.  I do believe she needs to see rheumatology at least once to ensure that there is not a clinical correlate with her positive ANA.  There is no interstitial disease on her CT chest

## 2018-07-09 NOTE — Progress Notes (Signed)
Subjective:    Patient ID: Hailey [name], female    DOB: [birth year], 73 y.o.   MRN: [id number]  HPI 73 year old never smoker with a significant cardiac history that includes hypertension, atrial fibrillation with ablations, tachybradycardia syndrome with pacemaker, coronary disease treated medically, diabetes, allergic rhinitis.  She was admitted to the hospital in mid November with paroxysmal nocturnal dyspnea, found to have exertional hypoxemia.  CT scan of the chest 04/23/2018 during that hospitalization that I reviewed showed bilateral effusions and some nonspecific nodular changes, question lymphadenopathy.  She was diuresed with some clinical improvement.  A cardiac catheterization showed no new culprit coronary disease, PAOP 19, PAP 68/28 (43), PVR 4.9 [place].  Her diuretics were adjusted and she was weaned off O2. Target wt is 121-123 lbs and she is maintaining. She started sildenafil 05/16/18. At first she had some visual effects, but this has resolved, no HA, no edema. She is on anticoagulation. Interested in [facility] rehab.   She has never taken diet medications.  No significant inhaled exposures.  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  CXR today reviewed > R effusion is much improved compared with hospitalization. Still some prominent interstitial markings.   ROV 07/09/18 --Hailey [name] is 29, never smoker who follows up today for secondary pulmonary hypertension.  She has documented left-sided heart dysfunction. She is on sildenafil and is tolerating.  She is on anticoagulation which makes chronic thromboembolic disease unlikely.  She underwent pulmonary function testing today which I have reviewed.  This shows evidence for mild restriction on spirometry but with normal lung volumes.  There is no significant bronchodilator response.  Her diffusion capacity is normal.  A V/q scan was done on 06/08/2018 that was low probability for chronic thromboembolic disease.  Autoimmune labs were reviewed and  showed a positive ANA, titer 1: 640.  Clinical significance for this unclear. Her CT chest from 04/23/18 shows effusions and some scattered pulmonary nodules.     Review of Systems  Constitutional: Positive for unexpected weight change. Negative for fever.  HENT: Negative for congestion, dental problem, ear pain, nosebleeds, postnasal drip, rhinorrhea, sinus pressure, sneezing, sore throat and trouble swallowing.   Eyes: Negative for redness and itching.  Respiratory: Positive for cough and shortness of breath. Negative for chest tightness and wheezing.   Cardiovascular: Negative for palpitations and leg swelling.  Gastrointestinal: Negative for nausea and vomiting.  Genitourinary: Negative for dysuria.  Musculoskeletal: Negative for joint swelling.  Skin: Negative for rash.  Allergic/Immunologic: Negative.  Negative for environmental allergies, food allergies and immunocompromised state.  Neurological: Negative for headaches.  Hematological: Does not bruise/bleed easily.  Psychiatric/Behavioral: Negative for dysphoric mood. The patient is not nervous/anxious.     Past Medical History:  Diagnosis Date  . Acute on chronic diastolic CHF (congestive heart failure), NYHA class 1 ([place]) 04/29/2013  . Allergic rhinitis   . Brady-tachy syndrome (HCC)    s/p STJ dual chamber PPM by Dr Sallyanne Kuster  . CAD (coronary artery disease)    70% RCA stenosis, treated medically  . Diabetes mellitus ([place])   . DJD (degenerative joint disease)   . Dysrhythmia    Paroxysmal Atrial Fib.  . Hypertension   . Hyperthyroidism   . Paroxysmal atrial fibrillation (HCC)   . Polio    as a child  . PONV (postoperative nausea and vomiting)   . Presence of permanent cardiac pacemaker      Family History  Problem Relation Age of Onset  . Heart disease Sister   .  Breast cancer Sister   . Breast cancer Sister      Social History   Socioeconomic History  . Marital status: Married    Spouse name: [name]  .  Number of children: Not on file  . Years of education: Not on file  . Highest education level: Not on file  Occupational History  . Occupation: housewife  Social Needs  . Financial resource strain: Not on file  . Food insecurity:    Worry: Not on file    Inability: Not on file  . Transportation needs:    Medical: Not on file    Non-medical: Not on file  Tobacco Use  . Smoking status: Never Smoker  . Smokeless tobacco: Never Used  Substance and Sexual Activity  . Alcohol use: No  . Drug use: No  . Sexual activity: Not Currently    Birth control/protection: Abstinence  Lifestyle  . Physical activity:    Days per week: Not on file    Minutes per session: Not on file  . Stress: Not on file  Relationships  . Social connections:    Talks on phone: Not on file    Gets together: Not on file    Attends religious service: Not on file    Active member of club or organization: Not on file    Attends meetings of clubs or organizations: Not on file    Relationship status: Not on file  . Intimate partner violence:    Fear of current or ex partner: Not on file    Emotionally abused: Not on file    Physically abused: Not on file    Forced sexual activity: Not on file  Other Topics Concern  . Not on file  Social History Narrative   Lives in [place].  Retired.     Allergies  Allergen Reactions  . Codeine Nausea And Vomiting and Nausea Only  . Ketorolac Nausea And Vomiting, Nausea Only and Other (See Comments)    "pt told to never take this med"  . Meperidine Nausea Only  . Demerol Nausea And Vomiting  . Dronedarone Other (See Comments)    Increased fluid retention  . Cyclobenzaprine Nausea And Vomiting     Outpatient Medications Prior to Visit  Medication Sig Dispense Refill  . azelastine (ASTELIN) 0.1 % nasal spray Place 2 sprays into both nostrils 2 (two) times daily. Use in each nostril as directed    . Cholecalciferol (VITAMIN D3) 1000 UNITS CAPS Take 1 capsule by  mouth daily.     . Cyanocobalamin (VITAMIN B12 PO) Take 1 tablet by mouth daily as needed (for energy).     Marland Kitchen DILT-XR 180 MG 24 hr capsule TAKE 1 CAPSULE BY MOUTH ONCE DAILY (Patient taking differently: Take 180 mg by mouth daily. ) 90 capsule 3  . furosemide (LASIX) 80 MG tablet Take 1 tablet (80 mg total) by mouth daily for 30 days. 30 tablet 0  . loperamide (IMODIUM A-D) 2 MG tablet Take 2 mg by mouth as needed for diarrhea or loose stools.     . metoprolol tartrate (LOPRESSOR) 50 MG tablet Take 1 tablet (50 mg total) by mouth 2 (two) times daily. 180 tablet 3  . montelukast (SINGULAIR) 10 MG tablet Take 10 mg by mouth at bedtime.    Marland Kitchen OVER THE COUNTER MEDICATION Take 1 tablet by mouth daily. Claritin    . oxybutynin (DITROPAN) 5 MG tablet Take 5 mg by mouth daily.   4  . pantoprazole (  PROTONIX) 40 MG tablet Take 40 mg by mouth daily.    . potassium chloride SA (KLOR-CON M20) 20 MEQ tablet Take 1 tablet (20 mEq total) by mouth daily. (Patient taking differently: Take 10 mEq by mouth daily. ) 90 tablet 3  . PRADAXA 150 MG CAPS capsule TAKE 1 CAPSULE BY MOUTH TWICE DAILY 180 capsule 1  . propylthiouracil (PTU) 50 MG tablet Take 50-100 mg by mouth See admin instructions. Take 100 mg by mouth in the morning and take 50 mg by mouth at bedtime    . saxagliptin HCl (ONGLYZA) 5 MG TABS tablet Take 5 mg by mouth daily.    . sildenafil (REVATIO) 20 MG tablet Take 1 tablet (20 mg total) by mouth 3 (three) times daily. 90 tablet 3  . spironolactone (ALDACTONE) 25 MG tablet TAKE 1 TABLET BY MOUTH ONCE DAILY 30 tablet 6  . ferrous sulfate 325 (65 FE) MG tablet Take 1 tablet (325 mg total) by mouth daily with breakfast. 30 tablet 0  . guaiFENesin (MUCINEX) 600 MG 12 hr tablet Take 600 mg by mouth daily.     No facility-administered medications prior to visit.         Objective:   Physical Exam Vitals:   07/09/18 1204  BP: 130/62  Pulse: 74  SpO2: 95%  Weight: 124 lb (56.2 kg)  Height: 4\' 11"   (1.499 m)   Gen: Pleasant, well-nourished elderly woman, in no distress,  normal affect  ENT: No lesions,  mouth clear,  oropharynx clear, no postnasal drip  Neck: No JVD, no stridor  Lungs: No use of accessory muscles, no crackles or wheezing on normal respiration, no wheeze on forced expiration  Cardiovascular: RRR, heart sounds normal, no murmur or gallops, no peripheral edema  Musculoskeletal: No deformities, no cyanosis or clubbing  Neuro: alert, awake, non focal  Skin: Warm, no lesions or rash       Assessment & Plan:  Secondary pulmonary arterial hypertension (HCC) Based on the evaluation thus far left-sided heart disease likely contributing the most to her PAH.  She has a positive ANA of unclear significance, titer 1: 640.  She denies any signs of myopathy, arthropathy, rash.  Sildenafil can be beneficial in this setting, decrease the frequency with which patient's have acute exacerbations of their CHF.  She is tolerating well and I think we should continue it.  No current indication to start adjunct PAH therapy.  I do believe she needs to see rheumatology at least once to ensure that there is not a clinical correlate with her positive ANA.  There is no interstitial disease on her CT chest  Pulmonary nodule Pulmonary nodular disease on her CT chest from 04/23/2018.  I believe she needs a 6 month follow-up. We will arrange next time.    Baltazar Apo, MD, PhD 07/09/2018, 1:30 PM [facility] [phone] or if no answer [phone]

## 2018-07-09 NOTE — Assessment & Plan Note (Signed)
Pulmonary nodular disease on her CT chest from 04/23/2018.  I believe she needs a 6 month follow-up. We will arrange next time.

## 2018-07-09 NOTE — Addendum Note (Signed)
Addended by: Valerie Salts on: 07/09/2018 01:38 PM   Modules accepted: Orders

## 2018-07-15 ENCOUNTER — Telehealth: Payer: Self-pay | Admitting: Emergency Medicine

## 2018-07-15 NOTE — Telephone Encounter (Signed)
Called and spoke with [facility]. She stated that they are needing the results from patients ANA, CRP and other rheumatoid labs to be faxed to them so that they can move forward with this referral. Delsa Sale stated that she sent over a medical release form on 07/09/18 and 07/14/18. Ria Comment please advise if you have this, thank you.

## 2018-07-15 NOTE — Progress Notes (Signed)
Remote pacemaker transmission.   

## 2018-07-16 NOTE — Telephone Encounter (Signed)
Labs have been faxed to [facility] at [phone]. Nothing further was needed.

## 2018-07-20 ENCOUNTER — Encounter (HOSPITAL_COMMUNITY): Payer: Self-pay

## 2018-07-20 ENCOUNTER — Other Ambulatory Visit: Payer: Self-pay | Admitting: Cardiovascular Disease

## 2018-07-20 ENCOUNTER — Encounter (HOSPITAL_COMMUNITY)
Admission: RE | Admit: 2018-07-20 | Discharge: 2018-07-20 | Disposition: A | Discharge status: Home / Self Care | Payer: Medicare Other | Source: Ambulatory Visit | Attending: Emergency Medicine | Admitting: Emergency Medicine

## 2018-07-20 VITALS — BP 90/50 | HR 70 | Ht 59.0 in | Wt 127.2 lb

## 2018-07-20 DIAGNOSIS — I272 Pulmonary hypertension, unspecified: Secondary | ICD-10-CM | POA: Insufficient documentation

## 2018-07-20 NOTE — Progress Notes (Signed)
Pulmonary Individual Treatment Plan  Patient Details  Name: Hailey [name] MRN: [id number] Date of Birth: Oct 26, 1945 Referring Provider:     PULMONARY REHAB OTHER RESP ORIENTATION from 07/20/2018 in [place]  Referring Provider  [place]rum      Initial Encounter Date:    PULMONARY REHAB OTHER RESP ORIENTATION from 07/20/2018 in [place]  Date  07/20/18      Visit Diagnosis: Pulmonary HTN ([place])  Patient's Home Medications on Admission:   Current Outpatient Medications:  .  azelastine (ASTELIN) 0.1 % nasal spray, Place 2 sprays into both nostrils at bedtime. Use in each nostril as directed , Disp: , Rfl:  .  Cholecalciferol (VITAMIN D3) 1000 UNITS CAPS, Take 1 capsule by mouth daily. , Disp: , Rfl:  .  Cyanocobalamin (VITAMIN B12 PO), Take 1 tablet by mouth daily as needed (for energy). , Disp: , Rfl:  .  DILT-XR 180 MG 24 hr capsule, TAKE 1 CAPSULE BY MOUTH ONCE DAILY (Patient taking differently: Take 180 mg by mouth daily. ), Disp: 90 capsule, Rfl: 3 .  furosemide (LASIX) 80 MG tablet, Take 1 tablet (80 mg total) by mouth daily for 30 days., Disp: 30 tablet, Rfl: 0 .  loperamide (IMODIUM A-D) 2 MG tablet, Take 2 mg by mouth as needed for diarrhea or loose stools. , Disp: , Rfl:  .  metoprolol tartrate (LOPRESSOR) 50 MG tablet, Take 1 tablet (50 mg total) by mouth 2 (two) times daily., Disp: 180 tablet, Rfl: 3 .  oxybutynin (DITROPAN) 5 MG tablet, Take 5 mg by mouth daily. , Disp: , Rfl: 4 .  pantoprazole (PROTONIX) 40 MG tablet, Take 40 mg by mouth daily., Disp: , Rfl:  .  PRADAXA 150 MG CAPS capsule, TAKE 1 CAPSULE BY MOUTH TWICE DAILY, Disp: 180 capsule, Rfl: 1 .  propylthiouracil (PTU) 50 MG tablet, Take 50-100 mg by mouth See admin instructions. Take 100 mg by mouth in the morning and take 50 mg by mouth at bedtime, Disp: , Rfl:  .  saxagliptin HCl (ONGLYZA) 5 MG TABS tablet, Take 5 mg by mouth daily., Disp: , Rfl:  .  sildenafil  (REVATIO) 20 MG tablet, Take 1 tablet (20 mg total) by mouth 3 (three) times daily., Disp: 90 tablet, Rfl: 3 .  spironolactone (ALDACTONE) 25 MG tablet, TAKE 1 TABLET BY MOUTH ONCE DAILY, Disp: 30 tablet, Rfl: 6 .  ferrous sulfate 325 (65 FE) MG tablet, Take 1 tablet (325 mg total) by mouth daily with breakfast. (Patient taking differently: Take 325 mg by mouth daily with breakfast. Pt still takes per MD recommendation), Disp: 30 tablet, Rfl: 0 .  montelukast (SINGULAIR) 10 MG tablet, Take 10 mg by mouth at bedtime., Disp: , Rfl:  .  OVER THE COUNTER MEDICATION, Take 1 tablet by mouth daily. Claritin, Disp: , Rfl:  .  potassium chloride SA (KLOR-CON M20) 20 MEQ tablet, Take 1 tablet (20 mEq total) by mouth daily., Disp: 90 tablet, Rfl: 3  Past Medical History: Past Medical History:  Diagnosis Date  . Acute on chronic diastolic CHF (congestive heart failure), NYHA class 1 ([place]) 04/29/2013  . Allergic rhinitis   . Brady-tachy syndrome (HCC)    s/p STJ dual chamber PPM by Dr Sallyanne Kuster  . CAD (coronary artery disease)    70% RCA stenosis, treated medically  . Diabetes mellitus ([place])   . DJD (degenerative joint disease)   . Dysrhythmia    Paroxysmal Atrial Fib.  . Hypertension   .  Hyperthyroidism   . Paroxysmal atrial fibrillation (HCC)   . Polio    as a child  . PONV (postoperative nausea and vomiting)   . Presence of permanent cardiac pacemaker     Tobacco Use: Social History   Tobacco Use  Smoking Status Never Smoker  Smokeless Tobacco Never Used    Labs: Recent Review Flowsheet Data    Labs for ITP Cardiac and Pulmonary Rehab Latest Ref Rng & Units 11/24/2010 11/24/2010 11/25/2010 04/22/2018 04/22/2018   Cholestrol 0 - 200 mg/dL 94 - - - -   LDLCALC 0 - 99 mg/dL 31 - - - -   HDL >39 mg/dL 53 - - - -   Trlycerides <150 mg/dL 51 - - - -   Hemoglobin A1c <5.7 % - 6.4(H) - - -   PHART 7.350 - 7.400 - - 7.415(H) - -   PCO2ART 35.0 - 45.0 mmHg - - 38.8 - -   HCO3 20.0 - 28.0  mmol/L - - 24.4(H) 28.9(H) 29.8(H)   TCO2 22 - 32 mmol/L - - 25.6 30 31    O2SAT % - - 94.9 55.0 55.0      Capillary Blood Glucose: Lab Results  Component Value Date   GLUCAP 134 (H) 04/25/2018   GLUCAP 106 (H) 04/25/2018   GLUCAP 115 (H) 04/24/2018   GLUCAP 92 04/24/2018   GLUCAP 100 (H) 04/24/2018     Pulmonary Assessment Scores: Pulmonary Assessment Scores    Row Name 07/20/18 1425         ADL UCSD   ADL Phase  Entry     SOB Score total  40     Rest  2     Walk  6     Stairs  2     Bath  0     Dress  0     Shop  3       CAT Score   CAT Score  15       mMRC Score   mMRC Score  2        Pulmonary Function Assessment: Pulmonary Function Assessment - 07/20/18 1426      Pulmonary Function Tests   FVC%  62 %    FEV1%  64 %    FEV1/FVC Ratio  77    RV%  98 %    DLCO%  50 %      Initial Spirometry Results   FVC%  62 %    FEV1%  64 %    FEV1/FVC Ratio  77      Breath   Bilateral Breath Sounds  Wheezes;Expiratory   Late expiratory wheezes.    Shortness of Breath  Limiting activity;Yes       Exercise Target Goals: Exercise Program Goal: Individual exercise prescription set using results from initial 6 min walk test and THRR while considering  patient's activity barriers and safety.   Exercise Prescription Goal: Initial exercise prescription builds to 30-45 minutes a day of aerobic activity, 2-3 days per week.  Home exercise guidelines will be given to patient during program as part of exercise prescription that the participant will acknowledge.  Activity Barriers & Risk Stratification: Activity Barriers & Cardiac Risk Stratification - 07/20/18 1514      Activity Barriers & Cardiac Risk Stratification   Activity Barriers  Shortness of Breath;Deconditioning;Muscular Weakness;Chest Pain/Angina    Cardiac Risk Stratification  High       6 Minute Walk: 6 Minute Walk    Row Name  07/20/18 1512         6 Minute Walk   Phase  Initial     Distance   650 feet     Walk Time  5.45 minutes     # of Rest Breaks  2     MPH  1.23     METS  1.94     RPE  13     Perceived Dyspnea   16     VO2 Peak  4.5     Symptoms  Yes (comment)     Comments  extreme SOB - had to take 2 rest breaks to cathc her breath.      Resting HR  70 bpm     Resting BP  90/50     Resting Oxygen Saturation   96 %     Exercise Oxygen Saturation  during 6 min walk  94 %     Max Ex. HR  86 bpm     Max Ex. BP  116/54     2 Minute Post BP  96/50        Oxygen Initial Assessment: Oxygen Initial Assessment - 07/20/18 1424      Home Oxygen   Home Oxygen Device  None    Sleep Oxygen Prescription  None    Home Exercise Oxygen Prescription  None    Home at Rest Exercise Oxygen Prescription  None      Initial 6 min Walk   Oxygen Used  None      Program Oxygen Prescription   Program Oxygen Prescription  None      Intervention   Short Term Goals  To learn and understand importance of monitoring SPO2 with pulse oximeter and demonstrate accurate use of the pulse oximeter.;To learn and understand importance of maintaining oxygen saturations>88%;To learn and demonstrate proper pursed lip breathing techniques or other breathing techniques.    Long  Term Goals  Verbalizes importance of monitoring SPO2 with pulse oximeter and return demonstration;Maintenance of O2 saturations>88%;Exhibits proper breathing techniques, such as pursed lip breathing or other method taught during program session       Oxygen Re-Evaluation:   Oxygen Discharge (Final Oxygen Re-Evaluation):   Initial Exercise Prescription: Initial Exercise Prescription - 07/20/18 1500      Date of Initial Exercise RX and Referring Provider   Date  07/20/18    Referring Provider  Byrum    Expected Discharge Date  10/18/18      NuStep   Level  1    SPM  70    Minutes  17    METs  2      Arm Ergometer   Level  1    Watts  9    RPM  40    Minutes  17    METs  1.7      Prescription Details    Frequency (times per week)  2    Duration  Progress to 30 minutes of continuous aerobic without signs/symptoms of physical distress      Intensity   THRR 40-80% of Max Heartrate  [phone]    Ratings of Perceived Exertion  11-13    Perceived Dyspnea  0-4      Progression   Progression  Continue to progress workloads to maintain intensity without signs/symptoms of physical distress.      Resistance Training   Training Prescription  Yes    Weight  0    Reps  10-15  Perform Capillary Blood Glucose checks as needed.  Exercise Prescription Changes:  Exercise Prescription Changes    Row Name 07/20/18 1500             Response to Exercise   Blood Pressure (Admit)  90/50       Blood Pressure (Exercise)  116/54       Blood Pressure (Exit)  96/50       Heart Rate (Admit)  70 bpm       Heart Rate (Exercise)  86 bpm       Heart Rate (Exit)  72 bpm       Oxygen Saturation (Admit)  96 %       Oxygen Saturation (Exercise)  94 %       Oxygen Saturation (Exit)  98 %       Rating of Perceived Exertion (Exercise)  13       Perceived Dyspnea (Exercise)  16       Symptoms  SOB       Comments  6 minute walk test       Intensity  THRR New [phone]         Home Exercise Plan   Plans to continue exercise at  Home (comment)       Frequency  Add 3 additional days to program exercise sessions.       Initial Home Exercises Provided  07/20/18          Exercise Comments:   Exercise Goals and Review:   Exercise Goals Re-Evaluation :   Discharge Exercise Prescription (Final Exercise Prescription Changes): Exercise Prescription Changes - 07/20/18 1500      Response to Exercise   Blood Pressure (Admit)  90/50    Blood Pressure (Exercise)  116/54    Blood Pressure (Exit)  96/50    Heart Rate (Admit)  70 bpm    Heart Rate (Exercise)  86 bpm    Heart Rate (Exit)  72 bpm    Oxygen Saturation (Admit)  96 %    Oxygen Saturation (Exercise)  94 %    Oxygen Saturation (Exit)   98 %    Rating of Perceived Exertion (Exercise)  13    Perceived Dyspnea (Exercise)  16    Symptoms  SOB    Comments  6 minute walk test    Intensity  THRR New   [phone]     Home Exercise Plan   Plans to continue exercise at  Home (comment)    Frequency  Add 3 additional days to program exercise sessions.    Initial Home Exercises Provided  07/20/18       Nutrition:  Target Goals: Understanding of nutrition guidelines, daily intake of sodium 1500mg , cholesterol 200mg , calories 30% from fat and 7% or less from saturated fats, daily to have 5 or more servings of fruits and vegetables.  Biometrics: Pre Biometrics - 07/20/18 1516      Pre Biometrics   Height  4\' 11"  (1.499 m)    Waist Circumference  31 inches    Hip Circumference  38.5 inches    Waist to Hip Ratio  0.81 %    Triceps Skinfold  13 mm    % Body Fat  33.7 %    Grip Strength  5.7 kg    Single Leg Stand  2.5 seconds        Nutrition Therapy Plan and Nutrition Goals:   Nutrition Assessments: Nutrition Assessments - 07/20/18 1433  MEDFICTS Scores   Pre Score  41       Nutrition Goals Re-Evaluation:   Nutrition Goals Discharge (Final Nutrition Goals Re-Evaluation):   Psychosocial: Target Goals: Acknowledge presence or absence of significant depression and/or stress, maximize coping skills, provide positive support system. Participant is able to verbalize types and ability to use techniques and skills needed for reducing stress and depression.  Initial Review & Psychosocial Screening: Initial Psych Review & Screening - 07/20/18 1527      Initial Review   Current issues with  None Identified      Family Dynamics   Good Support System?  Yes      Barriers   Psychosocial barriers to participate in program  There are no identifiable barriers or psychosocial needs.      Screening Interventions   Interventions  Encouraged to exercise       Quality of Life Scores: Quality of Life - 07/20/18  1521      Quality of Life   Select  Quality of Life      Quality of Life Scores   Health/Function Pre  24.1 %    Socioeconomic Pre  27.33 %    Psych/Spiritual Pre  26.86 %    Family Pre  28.8 %    GLOBAL Pre  25.98 %      Scores of 19 and below usually indicate a poorer quality of life in these areas.  A difference of  2-3 points is a clinically meaningful difference.  A difference of 2-3 points in the total score of the Quality of Life Index has been associated with significant improvement in overall quality of life, self-image, physical symptoms, and general health in studies assessing change in quality of life.   PHQ-9: Recent Review Flowsheet Data    Depression screen [facility] 2/9 07/20/2018   Decreased Interest 0   Down, Depressed, Hopeless 0   PHQ - 2 Score 0   Altered sleeping 0   Tired, decreased energy 1   Change in appetite 0   Feeling bad or failure about yourself  0   Trouble concentrating 0   Moving slowly or fidgety/restless 0   Suicidal thoughts 0   PHQ-9 Score 1   Difficult doing work/chores Not difficult at all     Interpretation of Total Score  Total Score Depression Severity:  1-4 = Minimal depression, 5-9 = Mild depression, 10-14 = Moderate depression, 15-19 = Moderately severe depression, 20-27 = Severe depression   Psychosocial Evaluation and Intervention: Psychosocial Evaluation - 07/20/18 1528      Psychosocial Evaluation & Interventions   Interventions  Encouraged to exercise with the program and follow exercise prescription;Stress management education;Relaxation education    Comments  Patient's initial QOL score was 25.98 and her PHQ-9 score was 1 with no pyschosocial issues identified.     Expected Outcomes  Patient will have no psychosocial issues identified at [facility].        Psychosocial Re-Evaluation:   Psychosocial Discharge (Final Psychosocial Re-Evaluation):    Education: Education Goals: Education classes will be provided on a  weekly basis, covering required topics. Participant will state understanding/return demonstration of topics presented.  Learning Barriers/Preferences: Learning Barriers/Preferences - 07/20/18 1515      Learning Barriers/Preferences   Learning Barriers  None    Learning Preferences  Skilled Demonstration;Written Material       Education Topics: How Lungs Work and Diseases: - Discuss the anatomy of the lungs and diseases that can affect the lungs,  such as COPD.   Exercise: -Discuss the importance of exercise, FITT principles of exercise, normal and abnormal responses to exercise, and how to exercise safely.   Environmental Irritants: -Discuss types of environmental irritants and how to limit exposure to environmental irritants.   Meds/Inhalers and oxygen: - Discuss respiratory medications, definition of an inhaler and oxygen, and the proper way to use an inhaler and oxygen.   Energy Saving Techniques: - Discuss methods to conserve energy and decrease shortness of breath when performing activities of daily living.    Bronchial Hygiene / Breathing Techniques: - Discuss breathing mechanics, pursed-lip breathing technique,  proper posture, effective ways to clear airways, and other functional breathing techniques   Cleaning Equipment: - Provides group verbal and written instruction about the health risks of elevated stress, cause of high stress, and healthy ways to reduce stress.   Nutrition I: Fats: - Discuss the types of cholesterol, what cholesterol does to the body, and how cholesterol levels can be controlled.   Nutrition II: Labels: -Discuss the different components of food labels and how to read food labels.   Respiratory Infections: - Discuss the signs and symptoms of respiratory infections, ways to prevent respiratory infections, and the importance of seeking medical treatment when having a respiratory infection.   Stress I: Signs and Symptoms: - Discuss the  causes of stress, how stress may lead to anxiety and depression, and ways to limit stress.   Stress II: Relaxation: -Discuss relaxation techniques to limit stress.   Oxygen for Home/Travel: - Discuss how to prepare for travel when on oxygen and proper ways to transport and store oxygen to ensure safety.   Knowledge Questionnaire Score: Knowledge Questionnaire Score - 07/20/18 1433      Knowledge Questionnaire Score   Pre Score  8/16       Core Components/Risk Factors/Patient Goals at Admission: Personal Goals and Risk Factors at Admission - 07/20/18 1517      Core Components/Risk Factors/Patient Goals on Admission    Weight Management  Weight Maintenance    Improve shortness of breath with ADL's  Yes    Intervention  Provide education, individualized exercise plan and daily activity instruction to help decrease symptoms of SOB with activities of daily living.    Expected Outcomes  Short Term: Improve cardiorespiratory fitness to achieve a reduction of symptoms when performing ADLs;Long Term: Be able to perform more ADLs without symptoms or delay the onset of symptoms    Diabetes  Yes    Intervention  Provide education about proper nutrition, including hydration, and aerobic/resistive exercise prescription along with prescribed medications to achieve blood glucose in normal ranges: Fasting glucose 65-99 mg/dL;Provide education about signs/symptoms and action to take for hypo/hyperglycemia.    Expected Outcomes  Long Term: Attainment of HbA1C < 7%.    Personal Goal Other  Yes    Personal Goal  Be able to do things without SOB and fatigue.     Intervention  Patient will attend Pulmonary Rehab 2 days/week and supplement with exercise at home 3 days/week.     Expected Outcomes  Patient will meet her personal goals.        Core Components/Risk Factors/Patient Goals Review:    Core Components/Risk Factors/Patient Goals at Discharge (Final Review):    ITP Comments:   Comments:  Patient arrived for 1st visit/orientation/education at 12:30. Patient was referred to CR by Baltazar Apo due to Pulmonary HTN (I27.20). During orientation advised patient on arrival and appointment times what to wear, what  to do before, during and after exercise. Reviewed attendance and class policy. Talked about inclement weather and class consultation policy. Pt is scheduled to return Pulmonary Rehab on 07/23/2018 at 1330. Pt was advised to come to class 15 minutes before class starts. Patient was also given instructions on meeting with the dietician and attending the Family Structure classes. Discussed RPE/Dpysnea scales. Discussed initial THR and how to find their radial and/or carotid pulse. Discussed the initial exercise prescription and how this effects their progress. Pt is eager to get started. Patient participated in warm up stretches followed by light weights and resistance bands. Patient was able to complete 6 minute walk test. Patient had to stop twice due to SOB. SOB subsided after each rest stop and was completely gone after 2 minute rest. Patient was measured for the equipment. Discussed equipment safety with patient. Took patient pre-anthropometric measurements. Patient finished visit at 15:15.

## 2018-07-20 NOTE — Progress Notes (Signed)
Cardiac/Pulmonary Rehab Medication Review by a Pharmacist  Does the patient  feel that his/her medications are working for him/her?  yes  Has the patient been experiencing any side effects to the medications prescribed?  yes  Does the patient measure his/her own blood pressure or blood glucose at home?  yes   Does the patient have any problems obtaining medications due to transportation or finances?   yes  Understanding of regimen: excellent Understanding of indications: excellent Potential of compliance: excellent  Questions asked to Determine Patient Understanding of Medication Regimen:  1. What is the name of the medication?  2. What is the medication used for?  3. When should it be taken?  4. How much should be taken?  5. How will you take it?  6. What side effects should you report?  Understanding Defined as: Excellent: All questions above are correct Good: Questions 1-4 are correct Fair: Questions 1-2 are correct  Poor: 1 or none of the above questions are correct   Pharmacist comments: Patient is not experiencing any side effects from medications. Patient is very compliant with regimen and monitors blood sugar and weight every day.    Despina Pole 07/20/2018 2:38 PM

## 2018-07-20 NOTE — Progress Notes (Signed)
Daily Session Note  Patient Details  Name: SHONETTE RHAMES MRN: [id number] Date of Birth: 22-May-1946 Referring Provider:     PULMONARY REHAB OTHER RESP ORIENTATION from 07/20/2018 in [place]  Referring Provider  [place]      Encounter Date: 07/20/2018  Check In: Session Check In - 07/20/18 1230      Check-In   Supervising physician immediately available to respond to emergencies  See telemetry face sheet for immediately available ER MD    Location  [facility]    Staff Present  Aundra Dubin, RN, Cory Munch, Exercise Physiologist;Diane Coad, MS, EP, [facility], Exercise Physiologist    Medication changes reported      No    Fall or balance concerns reported     No    Tobacco Cessation  --   Never smoked.    Warm-up and Cool-down  Performed as group-led Higher education careers adviser Performed  Yes    VAD Patient?  No    PAD/SET Patient?  No      Pain Assessment   Currently in Pain?  No/denies    Multiple Pain Sites  No       Capillary Blood Glucose: No results found for this or any previous visit (from the past 24 hour(s)).  Exercise Prescription Changes - 07/20/18 1500      Response to Exercise   Blood Pressure (Admit)  90/50    Blood Pressure (Exercise)  116/54    Blood Pressure (Exit)  96/50    Heart Rate (Admit)  70 bpm    Heart Rate (Exercise)  86 bpm    Heart Rate (Exit)  72 bpm    Oxygen Saturation (Admit)  96 %    Oxygen Saturation (Exercise)  94 %    Oxygen Saturation (Exit)  98 %    Rating of Perceived Exertion (Exercise)  13    Perceived Dyspnea (Exercise)  16    Symptoms  SOB    Comments  6 minute walk test    Intensity  THRR New   [phone]     Home Exercise Plan   Plans to continue exercise at  Home (comment)    Frequency  Add 3 additional days to program exercise sessions.    Initial Home Exercises Provided  07/20/18       Social History   Tobacco Use  Smoking Status Never Smoker  Smokeless  Tobacco Never Used    Goals Met:  Exercise tolerated well Personal goals reviewed No report of cardiac concerns or symptoms Strength training completed today  Goals Unmet:  Not Applicable  Comments: Patient's orientation visit today. Check out at 1515.   Dr. Sinda Du is Medical Director for [facility] Pulmonary Rehab.

## 2018-07-23 ENCOUNTER — Encounter (HOSPITAL_COMMUNITY): Payer: Medicare Other

## 2018-07-23 ENCOUNTER — Encounter (HOSPITAL_COMMUNITY): Payer: Self-pay

## 2018-07-25 ENCOUNTER — Other Ambulatory Visit: Payer: Self-pay | Admitting: Cardiovascular Disease

## 2018-07-27 ENCOUNTER — Other Ambulatory Visit: Payer: Self-pay

## 2018-07-27 MED ORDER — METOPROLOL TARTRATE 50 MG PO TABS: 50.0000 mg | ORAL_TABLET | Freq: Two times a day (BID) | ORAL | 3 refills | Status: DC

## 2018-07-28 ENCOUNTER — Encounter (HOSPITAL_COMMUNITY)
Admission: RE | Admit: 2018-07-28 | Discharge: 2018-07-28 | Disposition: A | Discharge status: Home / Self Care | Payer: Medicare Other | Source: Ambulatory Visit | Attending: Emergency Medicine | Admitting: Emergency Medicine

## 2018-07-28 DIAGNOSIS — I272 Pulmonary hypertension, unspecified: Secondary | ICD-10-CM

## 2018-07-28 NOTE — Progress Notes (Signed)
Daily Session Note  Patient Details  Name: Hailey [name] MRN: [id number] Date of Birth: [birth year] Referring Provider:     PULMONARY REHAB OTHER RESP ORIENTATION from 07/20/2018 in [place]  Referring Provider  [place]rum      Encounter Date: 07/28/2018  Check In: Session Check In - 07/28/18 1528      Check-In   Supervising physician immediately available to respond to emergencies  See telemetry face sheet for immediately available ER MD    Location  [facility]    Staff Present  Benay Pike, Exercise Physiologist;Diane Coad, MS, EP, [facility], Exercise Physiologist    Medication changes reported      No    Fall or balance concerns reported     No    Warm-up and Cool-down  Performed as group-led instruction    Resistance Training Performed  Yes    VAD Patient?  No    PAD/SET Patient?  No      Pain Assessment   Currently in Pain?  No/denies    Pain Score  0-No pain    Multiple Pain Sites  No       Capillary Blood Glucose: No results found for this or any previous visit (from the past 24 hour(s)).    Social History   Tobacco Use  Smoking Status Never Smoker  Smokeless Tobacco Never Used    Goals Met:  Proper associated with RPD/PD & O2 Sat Independence with exercise equipment Improved SOB with ADL's Using PLB without cueing & demonstrates good technique Exercise tolerated well No report of cardiac concerns or symptoms Strength training completed today  Goals Unmet:  Not Applicable  Comments: Pt able to follow exercise prescription today without complaint.  Will continue to monitor for progression. Check out 1430.   Dr. Sinda Du is Medical Director for [facility] Pulmonary Rehab.

## 2018-07-30 ENCOUNTER — Encounter (HOSPITAL_COMMUNITY)
Admission: RE | Admit: 2018-07-30 | Discharge: 2018-07-30 | Disposition: A | Discharge status: Home / Self Care | Payer: Medicare Other | Source: Ambulatory Visit | Attending: Emergency Medicine | Admitting: Emergency Medicine

## 2018-07-30 DIAGNOSIS — I272 Pulmonary hypertension, unspecified: Secondary | ICD-10-CM | POA: Diagnosis not present

## 2018-07-30 NOTE — Progress Notes (Signed)
Daily Session Note  Patient Details  Name: Hailey [name] MRN: [id number] Date of Birth: 06/01/1946 Referring Provider:     PULMONARY REHAB OTHER RESP ORIENTATION from 07/20/2018 in [place] [facility]  Referring Provider  Byrum      Encounter Date: 07/30/2018  Check In: Session Check In - 07/30/18 1330      Check-In   Supervising physician immediately available to respond to emergencies  See telemetry face sheet for immediately available ER MD    Location  [facility]    Staff Present  Diane Coad, MS, EP, CH[facility], Exercise Physiologist;Other; , RN, BSN    Medication changes reported      No    Fall or balance concerns reported     No    Warm-up and Cool-down  Performed as group-led instruction    Resistance Training Performed  Yes    VAD Patient?  No    PAD/SET Patient?  No      Pain Assessment   Currently in Pain?  No/denies    Pain Score  0-No pain    Multiple Pain Sites  No       Capillary Blood Glucose: No results found for this or any previous visit (from the past 24 hour(s)).    Social History   Tobacco Use  Smoking Status Never Smoker  Smokeless Tobacco Never Used    Goals Met:  Proper associated with RPD/PD & O2 Sat Independence with exercise equipment Improved SOB with ADL's Using PLB without cueing & demonstrates good technique Exercise tolerated well No report of cardiac concerns or symptoms Strength training completed today  Goals Unmet:  Not Applicable  Comments: Pt able to follow exercise prescription today without complaint.  Will continue to monitor for progression. Check out 1430.   Dr. Edward Hawkins is Medical Director for [facility]. 

## 2018-08-04 ENCOUNTER — Encounter (HOSPITAL_COMMUNITY)
Admission: RE | Admit: 2018-08-04 | Discharge: 2018-08-04 | Disposition: A | Discharge status: Home / Self Care | Payer: Medicare Other | Source: Ambulatory Visit | Attending: Emergency Medicine | Admitting: Emergency Medicine

## 2018-08-04 DIAGNOSIS — I272 Pulmonary hypertension, unspecified: Secondary | ICD-10-CM | POA: Insufficient documentation

## 2018-08-04 NOTE — Progress Notes (Signed)
Daily Session Note  Patient Details  Name: Hailey [name] MRN: [id number] Date of Birth: 1946-05-03 Referring Provider:     PULMONARY REHAB OTHER RESP ORIENTATION from 07/20/2018 in [place]  Referring Provider  Byrum      Encounter Date: 08/04/2018  Check In: Session Check In - 08/04/18 1330      Check-In   Supervising physician immediately available to respond to emergencies  See telemetry face sheet for immediately available ER MD    Location  [facility]    Staff Present  Russella Dar, MS, EP, [facility], Exercise Physiologist    Medication changes reported      No    Fall or balance concerns reported     No    Warm-up and Cool-down  Performed as group-led instruction    Resistance Training Performed  Yes    VAD Patient?  No    PAD/SET Patient?  No      Pain Assessment   Currently in Pain?  No/denies    Pain Score  0-No pain    Multiple Pain Sites  No       Capillary Blood Glucose: No results found for this or any previous visit (from the past 24 hour(s)).    Social History   Tobacco Use  Smoking Status Never Smoker  Smokeless Tobacco Never Used    Goals Met:  Proper associated with RPD/PD & O2 Sat Independence with exercise equipment Exercise tolerated well No report of cardiac concerns or symptoms Strength training completed today  Goals Unmet:  Not Applicable  Comments: Pt able to follow exercise prescription today without complaint.  Will continue to monitor for progression. Check out 1430.   Dr. Sinda Du is Medical Director for [facility].

## 2018-08-06 ENCOUNTER — Encounter (HOSPITAL_COMMUNITY)
Admission: RE | Admit: 2018-08-06 | Discharge: 2018-08-06 | Disposition: A | Discharge status: Home / Self Care | Payer: Medicare Other | Source: Ambulatory Visit | Attending: Emergency Medicine | Admitting: Emergency Medicine

## 2018-08-06 DIAGNOSIS — I272 Pulmonary hypertension, unspecified: Secondary | ICD-10-CM | POA: Diagnosis not present

## 2018-08-06 NOTE — Progress Notes (Signed)
Pulmonary Individual Treatment Plan  Patient Details  Name: Hailey [name] MRN: [id number] Date of Birth: May 09, 1946 Referring Provider:     PULMONARY REHAB OTHER RESP ORIENTATION from 07/20/2018 in [place]  Referring Provider  Byrum      Initial Encounter Date:    PULMONARY REHAB OTHER RESP ORIENTATION from 07/20/2018 in [place]  Date  07/20/18      Visit Diagnosis: Pulmonary HTN ([place])  Patient's Home Medications on Admission:   Current Outpatient Medications:  .  azelastine (ASTELIN) 0.1 % nasal spray, Place 2 sprays into both nostrils at bedtime. Use in each nostril as directed , Disp: , Rfl:  .  Cholecalciferol (VITAMIN D3) 1000 UNITS CAPS, Take 1 capsule by mouth daily. , Disp: , Rfl:  .  Cyanocobalamin (VITAMIN B12 PO), Take 1 tablet by mouth daily as needed (for energy). , Disp: , Rfl:  .  DILT-XR 180 MG 24 hr capsule, TAKE 1 CAPSULE BY MOUTH ONCE DAILY (Patient taking differently: Take 180 mg by mouth daily. ), Disp: 90 capsule, Rfl: 3 .  ferrous sulfate 325 (65 FE) MG tablet, Take 1 tablet (325 mg total) by mouth daily with breakfast. (Patient taking differently: Take 325 mg by mouth daily with breakfast. Pt still takes per MD recommendation), Disp: 30 tablet, Rfl: 0 .  furosemide (LASIX) 80 MG tablet, TAKE 1 TABLET BY MOUTH ONCE DAILY FOR 30 DAYS, Disp: 30 tablet, Rfl: 4 .  loperamide (IMODIUM A-D) 2 MG tablet, Take 2 mg by mouth as needed for diarrhea or loose stools. , Disp: , Rfl:  .  metoprolol tartrate (LOPRESSOR) 50 MG tablet, Take 1 tablet (50 mg total) by mouth 2 (two) times daily., Disp: 180 tablet, Rfl: 3 .  montelukast (SINGULAIR) 10 MG tablet, Take 10 mg by mouth at bedtime., Disp: , Rfl:  .  OVER THE COUNTER MEDICATION, Take 1 tablet by mouth daily. Claritin, Disp: , Rfl:  .  oxybutynin (DITROPAN) 5 MG tablet, Take 5 mg by mouth daily. , Disp: , Rfl: 4 .  pantoprazole (PROTONIX) 40 MG tablet, Take 40 mg by mouth  daily., Disp: , Rfl:  .  potassium chloride SA (KLOR-CON M20) 20 MEQ tablet, Take 1 tablet (20 mEq total) by mouth daily., Disp: 90 tablet, Rfl: 3 .  PRADAXA 150 MG CAPS capsule, TAKE 1 CAPSULE BY MOUTH TWICE DAILY, Disp: 180 capsule, Rfl: 1 .  propylthiouracil (PTU) 50 MG tablet, Take 50-100 mg by mouth See admin instructions. Take 100 mg by mouth in the morning and take 50 mg by mouth at bedtime, Disp: , Rfl:  .  saxagliptin HCl (ONGLYZA) 5 MG TABS tablet, Take 5 mg by mouth daily., Disp: , Rfl:  .  sildenafil (REVATIO) 20 MG tablet, Take 1 tablet (20 mg total) by mouth 3 (three) times daily., Disp: 90 tablet, Rfl: 3 .  spironolactone (ALDACTONE) 25 MG tablet, TAKE 1 TABLET BY MOUTH ONCE DAILY, Disp: 30 tablet, Rfl: 6  Past Medical History: Past Medical History:  Diagnosis Date  . Acute on chronic diastolic CHF (congestive heart failure), NYHA class 1 ([place]) 04/29/2013  . Allergic rhinitis   . Brady-tachy syndrome (HCC)    s/p STJ dual chamber PPM by Dr Sallyanne Kuster  . CAD (coronary artery disease)    70% RCA stenosis, treated medically  . Diabetes mellitus ([place])   . DJD (degenerative joint disease)   . Dysrhythmia    Paroxysmal Atrial Fib.  . Hypertension   .  Hyperthyroidism   . Paroxysmal atrial fibrillation (HCC)   . Polio    as a child  . PONV (postoperative nausea and vomiting)   . Presence of permanent cardiac pacemaker     Tobacco Use: Social History   Tobacco Use  Smoking Status Never Smoker  Smokeless Tobacco Never Used    Labs: Recent Review Flowsheet Data    Labs for ITP Cardiac and Pulmonary Rehab Latest Ref Rng & Units 11/24/2010 11/24/2010 11/25/2010 04/22/2018 04/22/2018   Cholestrol 0 - 200 mg/dL 94 - - - -   LDLCALC 0 - 99 mg/dL 31 - - - -   HDL >39 mg/dL 53 - - - -   Trlycerides <150 mg/dL 51 - - - -   Hemoglobin A1c <5.7 % - 6.4(H) - - -   PHART 7.350 - 7.400 - - 7.415(H) - -   PCO2ART 35.0 - 45.0 mmHg - - 38.8 - -   HCO3 20.0 - 28.0 mmol/L - - 24.4(H)  28.9(H) 29.8(H)   TCO2 22 - 32 mmol/L - - 25._0 O2SAT % - - 94.9 55.0 55.0      Capillary Blood Glucose: Lab Results  Component Value Date   GLUCAP 134 (H) 04/25/2018   GLUCAP 106 (H) 04/25/2018   GLUCAP 115 (H) 04/24/2018   GLUCAP 92 04/24/2018   GLUCAP 100 (H) 04/24/2018     Pulmonary Assessment Scores: Pulmonary Assessment Scores    Row Name 07/20/18 1425         ADL UCSD   ADL Phase  Entry     SOB Score total  40     Rest  2     Walk  6     Stairs  2     Bath  0     Dress  0     Shop  3       CAT Score   CAT Score  15       mMRC Score   mMRC Score  2        Pulmonary Function Assessment: Pulmonary Function Assessment - 07/20/18 1426      Pulmonary Function Tests   FVC%  62 %    FEV1%  64 %    FEV1/FVC Ratio  77    RV%  98 %    DLCO%  50 %      Initial Spirometry Results   FVC%  62 %    FEV1%  64 %    FEV1/FVC Ratio  77      Breath   Bilateral Breath Sounds  Wheezes;Expiratory   Late expiratory wheezes.    Shortness of Breath  Limiting activity;Yes       Exercise Target Goals: Exercise Program Goal: Individual exercise prescription set using results from initial 6 min walk test and THRR while considering  patient's activity barriers and safety.   Exercise Prescription Goal: Initial exercise prescription builds to 30-45 minutes a day of aerobic activity, 2-3 days per week.  Home exercise guidelines will be given to patient during program as part of exercise prescription that the participant will acknowledge.  Activity Barriers & Risk Stratification: Activity Barriers & Cardiac Risk Stratification - 07/20/18 1514      Activity Barriers & Cardiac Risk Stratification   Activity Barriers  Shortness of Breath;Deconditioning;Muscular Weakness;Chest Pain/Angina    Cardiac Risk Stratification  High       6 Minute Walk: 6 Minute Walk    Row Name  07/20/18 1512         6 Minute Walk   Phase  Initial     Distance  650 feet     Walk  Time  5.45 minutes     # of Rest Breaks  2     MPH  1.23     METS  1.94     RPE  13     Perceived Dyspnea   16     VO2 Peak  4.5     Symptoms  Yes (comment)     Comments  extreme SOB - had to take 2 rest breaks to cathc her breath.      Resting HR  70 bpm     Resting BP  90/50     Resting Oxygen Saturation   96 %     Exercise Oxygen Saturation  during 6 min walk  94 %     Max Ex. HR  86 bpm     Max Ex. BP  116/54     2 Minute Post BP  96/50        Oxygen Initial Assessment: Oxygen Initial Assessment - 07/20/18 1424      Home Oxygen   Home Oxygen Device  None    Sleep Oxygen Prescription  None    Home Exercise Oxygen Prescription  None    Home at Rest Exercise Oxygen Prescription  None      Initial 6 min Walk   Oxygen Used  None      Program Oxygen Prescription   Program Oxygen Prescription  None      Intervention   Short Term Goals  To learn and understand importance of monitoring SPO2 with pulse oximeter and demonstrate accurate use of the pulse oximeter.;To learn and understand importance of maintaining oxygen saturations>88%;To learn and demonstrate proper pursed lip breathing techniques or other breathing techniques.    Long  Term Goals  Verbalizes importance of monitoring SPO2 with pulse oximeter and return demonstration;Maintenance of O2 saturations>88%;Exhibits proper breathing techniques, such as pursed lip breathing or other method taught during program session       Oxygen Re-Evaluation:   Oxygen Discharge (Final Oxygen Re-Evaluation):   Initial Exercise Prescription: Initial Exercise Prescription - 07/20/18 1500      Date of Initial Exercise RX and Referring Provider   Date  07/20/18    Referring Provider  Byrum    Expected Discharge Date  10/18/18      NuStep   Level  1    SPM  70    Minutes  17    METs  2      Arm Ergometer   Level  1    Watts  9    RPM  40    Minutes  17    METs  1.7      Prescription Details   Frequency (times per  week)  2    Duration  Progress to 30 minutes of continuous aerobic without signs/symptoms of physical distress      Intensity   THRR 40-80% of Max Heartrate  [phone]    Ratings of Perceived Exertion  11-13    Perceived Dyspnea  0-4      Progression   Progression  Continue to progress workloads to maintain intensity without signs/symptoms of physical distress.      Resistance Training   Training Prescription  Yes    Weight  0    Reps  10-15  Perform Capillary Blood Glucose checks as needed.  Exercise Prescription Changes:  Exercise Prescription Changes    Row Name 07/20/18 1500             Response to Exercise   Blood Pressure (Admit)  90/50       Blood Pressure (Exercise)  116/54       Blood Pressure (Exit)  96/50       Heart Rate (Admit)  70 bpm       Heart Rate (Exercise)  86 bpm       Heart Rate (Exit)  72 bpm       Oxygen Saturation (Admit)  96 %       Oxygen Saturation (Exercise)  94 %       Oxygen Saturation (Exit)  98 %       Rating of Perceived Exertion (Exercise)  13       Perceived Dyspnea (Exercise)  16       Symptoms  SOB       Comments  6 minute walk test       Intensity  THRR New [phone]         Home Exercise Plan   Plans to continue exercise at  Home (comment)       Frequency  Add 3 additional days to program exercise sessions.       Initial Home Exercises Provided  07/20/18          Exercise Comments:  Exercise Comments    Row Name 08/05/18 1503           Exercise Comments  Pt. has attended 4 sessions so far. She has handled the exercise well and is working to improve SOB.           Exercise Goals and Review:   Exercise Goals Re-Evaluation : Exercise Goals Re-Evaluation    Row Name 08/05/18 1459             Exercise Goal Re-Evaluation   Exercise Goals Review  Increase Physical Activity;Increase Strength and Stamina;Able to understand and use rate of perceived exertion (RPE) scale;Able to understand and use  Dyspnea scale;Knowledge and understanding of Target Heart Rate Range (THRR);Able to check pulse independently;Understanding of Exercise Prescription       Comments  Pt. is new to the program, she has attended 4 sessions so far. She has tolerated the exercise well so far. As she builds her strength and stamina we will increase workloads as tolerated.        Expected Outcomes  ability to do more without SOB           Discharge Exercise Prescription (Final Exercise Prescription Changes): Exercise Prescription Changes - 07/20/18 1500      Response to Exercise   Blood Pressure (Admit)  90/50    Blood Pressure (Exercise)  116/54    Blood Pressure (Exit)  96/50    Heart Rate (Admit)  70 bpm    Heart Rate (Exercise)  86 bpm    Heart Rate (Exit)  72 bpm    Oxygen Saturation (Admit)  96 %    Oxygen Saturation (Exercise)  94 %    Oxygen Saturation (Exit)  98 %    Rating of Perceived Exertion (Exercise)  13    Perceived Dyspnea (Exercise)  16    Symptoms  SOB    Comments  6 minute walk test    Intensity  THRR New   [phone]     Home Exercise  Plan   Plans to continue exercise at  Home (comment)    Frequency  Add 3 additional days to program exercise sessions.    Initial Home Exercises Provided  07/20/18       Nutrition:  Target Goals: Understanding of nutrition guidelines, daily intake of sodium 1500mg , cholesterol 200mg , calories 30% from fat and 7% or less from saturated fats, daily to have 5 or more servings of fruits and vegetables.  Biometrics: Pre Biometrics - 07/20/18 1516      Pre Biometrics   Height  4\' 11"  (1.499 m)    Waist Circumference  31 inches    Hip Circumference  38.5 inches    Waist to Hip Ratio  0.81 %    Triceps Skinfold  13 mm    % Body Fat  33.7 %    Grip Strength  5.7 kg    Single Leg Stand  2.5 seconds        Nutrition Therapy Plan and Nutrition Goals:   Nutrition Assessments: Nutrition Assessments - 07/20/18 1433      MEDFICTS Scores   Pre  Score  41       Nutrition Goals Re-Evaluation:   Nutrition Goals Discharge (Final Nutrition Goals Re-Evaluation):   Psychosocial: Target Goals: Acknowledge presence or absence of significant depression and/or stress, maximize coping skills, provide positive support system. Participant is able to verbalize types and ability to use techniques and skills needed for reducing stress and depression.  Initial Review & Psychosocial Screening: Initial Psych Review & Screening - 07/20/18 1527      Initial Review   Current issues with  None Identified      Family Dynamics   Good Support System?  Yes      Barriers   Psychosocial barriers to participate in program  There are no identifiable barriers or psychosocial needs.      Screening Interventions   Interventions  Encouraged to exercise       Quality of Life Scores: Quality of Life - 07/20/18 1521      Quality of Life   Select  Quality of Life      Quality of Life Scores   Health/Function Pre  24.1 %    Socioeconomic Pre  27.33 %    Psych/Spiritual Pre  26.86 %    Family Pre  28.8 %    GLOBAL Pre  25.98 %      Scores of 19 and below usually indicate a poorer quality of life in these areas.  A difference of  2-3 points is a clinically meaningful difference.  A difference of 2-3 points in the total score of the Quality of Life Index has been associated with significant improvement in overall quality of life, self-image, physical symptoms, and general health in studies assessing change in quality of life.   PHQ-9: Recent Review Flowsheet Data    Depression screen [facility] 2/9 07/20/2018   Decreased Interest 0   Down, Depressed, Hopeless 0   PHQ - 2 Score 0   Altered sleeping 0   Tired, decreased energy 1   Change in appetite 0   Feeling bad or failure about yourself  0   Trouble concentrating 0   Moving slowly or fidgety/restless 0   Suicidal thoughts 0   PHQ-9 Score 1   Difficult doing work/chores Not difficult at all      Interpretation of Total Score  Total Score Depression Severity:  1-4 = Minimal depression, 5-9 = Mild depression, 10-14 =  Moderate depression, 15-19 = Moderately severe depression, 20-27 = Severe depression   Psychosocial Evaluation and Intervention: Psychosocial Evaluation - 07/20/18 1528      Psychosocial Evaluation & Interventions   Interventions  Encouraged to exercise with the program and follow exercise prescription;Stress management education;Relaxation education    Comments  Patient's initial QOL score was 25.98 and her PHQ-9 score was 1 with no pyschosocial issues identified.     Expected Outcomes  Patient will have no psychosocial issues identified at [place].        Psychosocial Re-Evaluation:   Psychosocial Discharge (Final Psychosocial Re-Evaluation):    Education: Education Goals: Education classes will be provided on a weekly basis, covering required topics. Participant will state understanding/return demonstration of topics presented.  Learning Barriers/Preferences: Learning Barriers/Preferences - 07/20/18 1515      Learning Barriers/Preferences   Learning Barriers  None    Learning Preferences  Skilled Demonstration;Written Material       Education Topics: How Lungs Work and Diseases: - Discuss the anatomy of the lungs and diseases that can affect the lungs, such as COPD.   Exercise: -Discuss the importance of exercise, FITT principles of exercise, normal and abnormal responses to exercise, and how to exercise safely.   Environmental Irritants: -Discuss types of environmental irritants and how to limit exposure to environmental irritants.   Meds/Inhalers and oxygen: - Discuss respiratory medications, definition of an inhaler and oxygen, and the proper way to use an inhaler and oxygen.   Energy Saving Techniques: - Discuss methods to conserve energy and decrease shortness of breath when performing activities of daily living.    PULMONARY REHAB  OTHER RESPIRATORY from 07/30/2018 in [place]  Date  07/30/18  Educator  Coad  Instruction Review Code  2- Demonstrated Understanding      Bronchial Hygiene / Breathing Techniques: - Discuss breathing mechanics, pursed-lip breathing technique,  proper posture, effective ways to clear airways, and other functional breathing techniques   Cleaning Equipment: - Provides group verbal and written instruction about the health risks of elevated stress, cause of high stress, and healthy ways to reduce stress.   Nutrition I: Fats: - Discuss the types of cholesterol, what cholesterol does to the body, and how cholesterol levels can be controlled.   Nutrition II: Labels: -Discuss the different components of food labels and how to read food labels.   Respiratory Infections: - Discuss the signs and symptoms of respiratory infections, ways to prevent respiratory infections, and the importance of seeking medical treatment when having a respiratory infection.   Stress I: Signs and Symptoms: - Discuss the causes of stress, how stress may lead to anxiety and depression, and ways to limit stress.   Stress II: Relaxation: -Discuss relaxation techniques to limit stress.   Oxygen for Home/Travel: - Discuss how to prepare for travel when on oxygen and proper ways to transport and store oxygen to ensure safety.   Knowledge Questionnaire Score: Knowledge Questionnaire Score - 07/20/18 1433      Knowledge Questionnaire Score   Pre Score  8/16       Core Components/Risk Factors/Patient Goals at Admission: Personal Goals and Risk Factors at Admission - 07/20/18 1517      Core Components/Risk Factors/Patient Goals on Admission    Weight Management  Weight Maintenance    Improve shortness of breath with ADL's  Yes    Intervention  Provide education, individualized exercise plan and daily activity instruction to help decrease symptoms of SOB with activities of daily  living.    Expected Outcomes  Short Term: Improve cardiorespiratory fitness to achieve a reduction of symptoms when performing ADLs;Long Term: Be able to perform more ADLs without symptoms or delay the onset of symptoms    Diabetes  Yes    Intervention  Provide education about proper nutrition, including hydration, and aerobic/resistive exercise prescription along with prescribed medications to achieve blood glucose in normal ranges: Fasting glucose 65-99 mg/dL;Provide education about signs/symptoms and action to take for hypo/hyperglycemia.    Expected Outcomes  Long Term: Attainment of HbA1C < 7%.    Personal Goal Other  Yes    Personal Goal  Be able to do things without SOB and fatigue.     Intervention  Patient will attend Pulmonary Rehab 2 days/week and supplement with exercise at home 3 days/week.     Expected Outcomes  Patient will meet her personal goals.        Core Components/Risk Factors/Patient Goals Review:    Core Components/Risk Factors/Patient Goals at Discharge (Final Review):    ITP Comments: ITP Comments    Row Name 08/06/18 0817           ITP Comments  Patient is new to the program completing 4 sessions. Will continue to monitor for progress.           Comments: ITP REVIEW Patient is new to the program completing 4 sessions. Will continue to monitor for progress.

## 2018-08-06 NOTE — Progress Notes (Signed)
Daily Session Note  Patient Details  Name: GHISLAINE HARCUM MRN: [id number] Date of Birth: [birth year] Referring Provider:     PULMONARY REHAB OTHER RESP ORIENTATION from 07/20/2018 in [place]  Referring Provider  [place]      Encounter Date: 08/06/2018  Check In: Session Check In - 08/06/18 1045      Check-In   Supervising physician immediately available to respond to emergencies  See telemetry face sheet for immediately available ER MD    Location  [facility]    Staff Present  Russella Dar, MS, EP, [facility], Exercise Physiologist;Debra Wynetta Emery, RN, Cory Munch, Exercise Physiologist    Medication changes reported      No    Fall or balance concerns reported     No    Warm-up and Cool-down  Performed as group-led instruction    Resistance Training Performed  Yes    VAD Patient?  No    PAD/SET Patient?  No      Pain Assessment   Currently in Pain?  No/denies    Pain Score  0-No pain    Multiple Pain Sites  No       Capillary Blood Glucose: No results found for this or any previous visit (from the past 24 hour(s)).    Social History   Tobacco Use  Smoking Status Never Smoker  Smokeless Tobacco Never Used    Goals Met:  Proper associated with RPD/PD & O2 Sat Independence with exercise equipment Improved SOB with ADL's Exercise tolerated well No report of cardiac concerns or symptoms Strength training completed today  Goals Unmet:  Not Applicable  Comments: Pt able to follow exercise prescription today without complaint.  Will continue to monitor for progression. Check out 1145.   Dr. Sinda Du is Medical Director for [facility].

## 2018-08-11 ENCOUNTER — Encounter (HOSPITAL_COMMUNITY)
Admission: RE | Admit: 2018-08-11 | Discharge: 2018-08-11 | Disposition: A | Discharge status: Home / Self Care | Payer: Medicare Other | Source: Ambulatory Visit | Attending: Emergency Medicine | Admitting: Emergency Medicine

## 2018-08-11 DIAGNOSIS — I272 Pulmonary hypertension, unspecified: Secondary | ICD-10-CM | POA: Diagnosis not present

## 2018-08-11 NOTE — Progress Notes (Signed)
Daily Session Note  Patient Details  Name: Hailey [name] MRN: [id number] Date of Birth: 06-Sep-1945 Referring Provider:     PULMONARY REHAB OTHER RESP ORIENTATION from 07/20/2018 in [facility]  Referring Provider  [place]      Encounter Date: 08/11/2018  Check In: Session Check In - 08/11/18 1330      Check-In   Supervising physician immediately available to respond to emergencies  See telemetry face sheet for immediately available ER MD    Location  [facility]    Staff Present  Russella Dar, MS, EP, [facility], Exercise Physiologist;Delonte Musich Zachery Conch, Exercise Physiologist;Other    Medication changes reported      No    Fall or balance concerns reported     No    Warm-up and Cool-down  Performed as group-led instruction    Resistance Training Performed  Yes    VAD Patient?  No    PAD/SET Patient?  No      Pain Assessment   Currently in Pain?  No/denies    Pain Score  0-No pain    Multiple Pain Sites  No       Capillary Blood Glucose: No results found for this or any previous visit (from the past 24 hour(s)).    Social History   Tobacco Use  Smoking Status Never Smoker  Smokeless Tobacco Never Used    Goals Met:  Proper associated with RPD/PD & O2 Sat Independence with exercise equipment Improved SOB with ADL's Using PLB without cueing & demonstrates good technique Exercise tolerated well No report of cardiac concerns or symptoms Strength training completed today  Goals Unmet:  Not Applicable  Comments: Pt able to follow exercise prescription today without complaint.  Will continue to monitor for progression. Check out 1430.   Dr. Sinda Du is Medical Director for [facility] Re[facility].

## 2018-08-13 ENCOUNTER — Encounter (HOSPITAL_COMMUNITY)
Admission: RE | Admit: 2018-08-13 | Discharge: 2018-08-13 | Disposition: A | Discharge status: Home / Self Care | Payer: Medicare Other | Source: Ambulatory Visit | Attending: Emergency Medicine | Admitting: Emergency Medicine

## 2018-08-13 ENCOUNTER — Other Ambulatory Visit: Payer: Self-pay

## 2018-08-13 DIAGNOSIS — I272 Pulmonary hypertension, unspecified: Secondary | ICD-10-CM | POA: Diagnosis not present

## 2018-08-13 NOTE — Progress Notes (Signed)
Daily Session Note  Patient Details  Name: Hailey [name] MRN: [id number] Date of Birth: July 11, 1945 Referring Provider:     PULMONARY REHAB OTHER RESP ORIENTATION from 07/20/2018 in [place]  Referring Provider  [place]      Encounter Date: 08/13/2018  Check In: Session Check In - 08/13/18 1330      Check-In   Supervising physician immediately available to respond to emergencies  See telemetry face sheet for immediately available ER MD    Location  [facility]    Staff Present  Benay Pike, Exercise Physiologist;Other;Debra Wynetta Emery, RN, BSN    Medication changes reported      No    Fall or balance concerns reported     No    Warm-up and Cool-down  Performed as group-led instruction    Resistance Training Performed  Yes    VAD Patient?  No    PAD/SET Patient?  No      Pain Assessment   Currently in Pain?  No/denies    Pain Score  0-No pain    Multiple Pain Sites  No       Capillary Blood Glucose: No results found for this or any previous visit (from the past 24 hour(s)).    Social History   Tobacco Use  Smoking Status Never Smoker  Smokeless Tobacco Never Used    Goals Met:  Proper associated with RPD/PD & O2 Sat Independence with exercise equipment Improved SOB with ADL's Using PLB without cueing & demonstrates good technique Exercise tolerated well No report of cardiac concerns or symptoms Strength training completed today  Goals Unmet:  Not Applicable  Comments: Pt able to follow exercise prescription today without complaint.  Will continue to monitor for progression. Check out 1430.   Dr. Sinda Du is Medical Director for [facility] Pulmonary Rehab.

## 2018-08-18 ENCOUNTER — Encounter (HOSPITAL_COMMUNITY): Payer: Medicare Other

## 2018-08-20 ENCOUNTER — Encounter (HOSPITAL_COMMUNITY): Payer: Medicare Other

## 2018-08-24 NOTE — Progress Notes (Addendum)
Pulmonary Individual Treatment Plan  Patient Details  Name: Hailey [name] MRN: [id number] Date of Birth: May 09, 1946 Referring Provider:     PULMONARY REHAB OTHER RESP ORIENTATION from 07/20/2018 in [place]  Referring Provider  Byrum      Initial Encounter Date:    PULMONARY REHAB OTHER RESP ORIENTATION from 07/20/2018 in [place]  Date  07/20/18      Visit Diagnosis: Pulmonary HTN ([place])  Patient's Home Medications on Admission:   Current Outpatient Medications:  .  azelastine (ASTELIN) 0.1 % nasal spray, Place 2 sprays into both nostrils at bedtime. Use in each nostril as directed , Disp: , Rfl:  .  Cholecalciferol (VITAMIN D3) 1000 UNITS CAPS, Take 1 capsule by mouth daily. , Disp: , Rfl:  .  Cyanocobalamin (VITAMIN B12 PO), Take 1 tablet by mouth daily as needed (for energy). , Disp: , Rfl:  .  DILT-XR 180 MG 24 hr capsule, TAKE 1 CAPSULE BY MOUTH ONCE DAILY (Patient taking differently: Take 180 mg by mouth daily. ), Disp: 90 capsule, Rfl: 3 .  ferrous sulfate 325 (65 FE) MG tablet, Take 1 tablet (325 mg total) by mouth daily with breakfast. (Patient taking differently: Take 325 mg by mouth daily with breakfast. Pt still takes per MD recommendation), Disp: 30 tablet, Rfl: 0 .  furosemide (LASIX) 80 MG tablet, TAKE 1 TABLET BY MOUTH ONCE DAILY FOR 30 DAYS, Disp: 30 tablet, Rfl: 4 .  loperamide (IMODIUM A-D) 2 MG tablet, Take 2 mg by mouth as needed for diarrhea or loose stools. , Disp: , Rfl:  .  metoprolol tartrate (LOPRESSOR) 50 MG tablet, Take 1 tablet (50 mg total) by mouth 2 (two) times daily., Disp: 180 tablet, Rfl: 3 .  montelukast (SINGULAIR) 10 MG tablet, Take 10 mg by mouth at bedtime., Disp: , Rfl:  .  OVER THE COUNTER MEDICATION, Take 1 tablet by mouth daily. Claritin, Disp: , Rfl:  .  oxybutynin (DITROPAN) 5 MG tablet, Take 5 mg by mouth daily. , Disp: , Rfl: 4 .  pantoprazole (PROTONIX) 40 MG tablet, Take 40 mg by mouth  daily., Disp: , Rfl:  .  potassium chloride SA (KLOR-CON M20) 20 MEQ tablet, Take 1 tablet (20 mEq total) by mouth daily., Disp: 90 tablet, Rfl: 3 .  PRADAXA 150 MG CAPS capsule, TAKE 1 CAPSULE BY MOUTH TWICE DAILY, Disp: 180 capsule, Rfl: 1 .  propylthiouracil (PTU) 50 MG tablet, Take 50-100 mg by mouth See admin instructions. Take 100 mg by mouth in the morning and take 50 mg by mouth at bedtime, Disp: , Rfl:  .  saxagliptin HCl (ONGLYZA) 5 MG TABS tablet, Take 5 mg by mouth daily., Disp: , Rfl:  .  sildenafil (REVATIO) 20 MG tablet, Take 1 tablet (20 mg total) by mouth 3 (three) times daily., Disp: 90 tablet, Rfl: 3 .  spironolactone (ALDACTONE) 25 MG tablet, TAKE 1 TABLET BY MOUTH ONCE DAILY, Disp: 30 tablet, Rfl: 6  Past Medical History: Past Medical History:  Diagnosis Date  . Acute on chronic diastolic CHF (congestive heart failure), NYHA class 1 ([place]) 04/29/2013  . Allergic rhinitis   . Brady-tachy syndrome (HCC)    s/p STJ dual chamber PPM by Dr Sallyanne Kuster  . CAD (coronary artery disease)    70% RCA stenosis, treated medically  . Diabetes mellitus ([place])   . DJD (degenerative joint disease)   . Dysrhythmia    Paroxysmal Atrial Fib.  . Hypertension   .  Hyperthyroidism   . Paroxysmal atrial fibrillation (HCC)   . Polio    as a child  . PONV (postoperative nausea and vomiting)   . Presence of permanent cardiac pacemaker     Tobacco Use: Social History   Tobacco Use  Smoking Status Never Smoker  Smokeless Tobacco Never Used    Labs: Recent Review Flowsheet Data    Labs for ITP Cardiac and Pulmonary Rehab Latest Ref Rng & Units 11/24/2010 11/24/2010 11/25/2010 04/22/2018 04/22/2018   Cholestrol 0 - 200 mg/dL 94 - - - -   LDLCALC 0 - 99 mg/dL 31 - - - -   HDL >39 mg/dL 53 - - - -   Trlycerides <150 mg/dL 51 - - - -   Hemoglobin A1c <5.7 % - 6.4(H) - - -   PHART 7.350 - 7.400 - - 7.415(H) - -   PCO2ART 35.0 - 45.0 mmHg - - 38.8 - -   HCO3 20.0 - 28.0 mmol/L - - 24.4(H)  28.9(H) 29.8(H)   TCO2 22 - 32 mmol/L - - 25._0 O2SAT % - - 94.9 55.0 55.0      Capillary Blood Glucose: Lab Results  Component Value Date   GLUCAP 134 (H) 04/25/2018   GLUCAP 106 (H) 04/25/2018   GLUCAP 115 (H) 04/24/2018   GLUCAP 92 04/24/2018   GLUCAP 100 (H) 04/24/2018     Pulmonary Assessment Scores: Pulmonary Assessment Scores    Row Name 07/20/18 1425         ADL UCSD   ADL Phase  Entry     SOB Score total  40     Rest  2     Walk  6     Stairs  2     Bath  0     Dress  0     Shop  3       CAT Score   CAT Score  15       mMRC Score   mMRC Score  2        Pulmonary Function Assessment: Pulmonary Function Assessment - 07/20/18 1426      Pulmonary Function Tests   FVC%  62 %    FEV1%  64 %    FEV1/FVC Ratio  77    RV%  98 %    DLCO%  50 %      Initial Spirometry Results   FVC%  62 %    FEV1%  64 %    FEV1/FVC Ratio  77      Breath   Bilateral Breath Sounds  Wheezes;Expiratory   Late expiratory wheezes.    Shortness of Breath  Limiting activity;Yes       Exercise Target Goals: Exercise Program Goal: Individual exercise prescription set using results from initial 6 min walk test and THRR while considering  patient's activity barriers and safety.   Exercise Prescription Goal: Initial exercise prescription builds to 30-45 minutes a day of aerobic activity, 2-3 days per week.  Home exercise guidelines will be given to patient during program as part of exercise prescription that the participant will acknowledge.  Activity Barriers & Risk Stratification: Activity Barriers & Cardiac Risk Stratification - 07/20/18 1514      Activity Barriers & Cardiac Risk Stratification   Activity Barriers  Shortness of Breath;Deconditioning;Muscular Weakness;Chest Pain/Angina    Cardiac Risk Stratification  High       6 Minute Walk: 6 Minute Walk    Row Name  07/20/18 1512         6 Minute Walk   Phase  Initial     Distance  650 feet     Walk  Time  5.45 minutes     # of Rest Breaks  2     MPH  1.23     METS  1.94     RPE  13     Perceived Dyspnea   16     VO2 Peak  4.5     Symptoms  Yes (comment)     Comments  extreme SOB - had to take 2 rest breaks to cathc her breath.      Resting HR  70 bpm     Resting BP  90/50     Resting Oxygen Saturation   96 %     Exercise Oxygen Saturation  during 6 min walk  94 %     Max Ex. HR  86 bpm     Max Ex. BP  116/54     2 Minute Post BP  96/50        Oxygen Initial Assessment: Oxygen Initial Assessment - 07/20/18 1424      Home Oxygen   Home Oxygen Device  None    Sleep Oxygen Prescription  None    Home Exercise Oxygen Prescription  None    Home at Rest Exercise Oxygen Prescription  None      Initial 6 min Walk   Oxygen Used  None      Program Oxygen Prescription   Program Oxygen Prescription  None      Intervention   Short Term Goals  To learn and understand importance of monitoring SPO2 with pulse oximeter and demonstrate accurate use of the pulse oximeter.;To learn and understand importance of maintaining oxygen saturations>88%;To learn and demonstrate proper pursed lip breathing techniques or other breathing techniques.    Long  Term Goals  Verbalizes importance of monitoring SPO2 with pulse oximeter and return demonstration;Maintenance of O2 saturations>88%;Exhibits proper breathing techniques, such as pursed lip breathing or other method taught during program session       Oxygen Re-Evaluation: Oxygen Re-Evaluation    Row Name 08/24/18 1430             Program Oxygen Prescription   Program Oxygen Prescription  None         Home Oxygen   Home Oxygen Device  None       Sleep Oxygen Prescription  None       Home Exercise Oxygen Prescription  None       Home at Rest Exercise Oxygen Prescription  None       Compliance with Home Oxygen Use  - N/A         Goals/Expected Outcomes   Short Term Goals  To learn and understand importance of monitoring SPO2 with  pulse oximeter and demonstrate accurate use of the pulse oximeter.;To learn and understand importance of maintaining oxygen saturations>88%;To learn and demonstrate proper pursed lip breathing techniques or other breathing techniques.       Long  Term Goals  Verbalizes importance of monitoring SPO2 with pulse oximeter and return demonstration;Maintenance of O2 saturations>88%;Exhibits proper breathing techniques, such as pursed lip breathing or other method taught during program session       Comments  Patient is able to verbalize the importance of maintaining her O2 Sat >88% and she is able to demonstrated both proper usage of pulse oximtry and pursed lip breathing  techniques during sessions.        Goals/Expected Outcomes  Patient will continue to meet both her short and long term goals.           Oxygen Discharge (Final Oxygen Re-Evaluation): Oxygen Re-Evaluation - 08/24/18 1430      Program Oxygen Prescription   Program Oxygen Prescription  None      Home Oxygen   Home Oxygen Device  None    Sleep Oxygen Prescription  None    Home Exercise Oxygen Prescription  None    Home at Rest Exercise Oxygen Prescription  None    Compliance with Home Oxygen Use  --   N/A     Goals/Expected Outcomes   Short Term Goals  To learn and understand importance of monitoring SPO2 with pulse oximeter and demonstrate accurate use of the pulse oximeter.;To learn and understand importance of maintaining oxygen saturations>88%;To learn and demonstrate proper pursed lip breathing techniques or other breathing techniques.    Long  Term Goals  Verbalizes importance of monitoring SPO2 with pulse oximeter and return demonstration;Maintenance of O2 saturations>88%;Exhibits proper breathing techniques, such as pursed lip breathing or other method taught during program session    Comments  Patient is able to verbalize the importance of maintaining her O2 Sat >88% and she is able to demonstrated both proper usage of  pulse oximtry and pursed lip breathing techniques during sessions.     Goals/Expected Outcomes  Patient will continue to meet both her short and long term goals.        Initial Exercise Prescription: Initial Exercise Prescription - 07/20/18 1500      Date of Initial Exercise RX and Referring Provider   Date  07/20/18    Referring Provider  Byrum    Expected Discharge Date  10/18/18      NuStep   Level  1    SPM  70    Minutes  17    METs  2      Arm Ergometer   Level  1    Watts  9    RPM  40    Minutes  17    METs  1.7      Prescription Details   Frequency (times per week)  2    Duration  Progress to 30 minutes of continuous aerobic without signs/symptoms of physical distress      Intensity   THRR 40-80% of Max Heartrate  [phone]    Ratings of Perceived Exertion  11-13    Perceived Dyspnea  0-4      Progression   Progression  Continue to progress workloads to maintain intensity without signs/symptoms of physical distress.      Resistance Training   Training Prescription  Yes    Weight  0    Reps  10-15       Perform Capillary Blood Glucose checks as needed.  Exercise Prescription Changes:  Exercise Prescription Changes    Row Name 07/20/18 1500 08/11/18 1000 08/24/18 1100         Response to Exercise   Blood Pressure (Admit)  90/50  122/70  104/56     Blood Pressure (Exercise)  116/54  110/60  126/60     Blood Pressure (Exit)  96/50  96/62  108/58     Heart Rate (Admit)  70 bpm  102 bpm  76 bpm     Heart Rate (Exercise)  86 bpm  103 bpm  100 bpm  Heart Rate (Exit)  72 bpm  95 bpm  84 bpm     Oxygen Saturation (Admit)  96 %  95 %  92 %     Oxygen Saturation (Exercise)  94 %  93 %  93 %     Oxygen Saturation (Exit)  98 %  93 %  96 %     Rating of Perceived Exertion (Exercise)  _0 Perceived Dyspnea (Exercise)  _1 Symptoms  SOB  -  -     Comments  6 minute walk test  first two weeks of exercise   -     Duration  -   Continue with 30 min of aerobic exercise without signs/symptoms of physical distress.  Continue with 30 min of aerobic exercise without signs/symptoms of physical distress.     Intensity  THRR New 107-116-131  THRR unchanged  THRR unchanged       Progression   Progression  -  Continue to progress workloads to maintain intensity without signs/symptoms of physical distress.  Continue to progress workloads to maintain intensity without signs/symptoms of physical distress.     Average METs  -  1.6  1.6       Resistance Training   Training Prescription  -  Yes  Yes     Weight  -  0  0     Reps  -  10-15  10-15       Treadmill   MPH  -  -  0.6     Grade  -  -  0     Minutes  -  -  17     METs  -  -  1.4       NuStep   Level  -  1  1     SPM  -  64  69     Minutes  -  17  17     METs  -  1.9  1.8       Arm Ergometer   Level  -  1  - due to diagnosis of PHTN we had to switch to TM     Watts  -  4  -     RPM  -  16  -     Minutes  -  22  -     METs  -  1.3  -       Home Exercise Plan   Plans to continue exercise at  Home (comment)  Home (comment)  Home (comment)     Frequency  Add 3 additional days to program exercise sessions.  Add 3 additional days to program exercise sessions.  Add 3 additional days to program exercise sessions.     Initial Home Exercises Provided  07/20/18  07/20/18  07/20/18        Exercise Comments:  Exercise Comments    Row Name 08/05/18 1503 08/24/18 1112         Exercise Comments  Pt. has attended 4 sessions so far. She has handled the exercise well and is working to improve SOB.   Pt. has attended 7 sessions. She is tolerating the exercise well. We switched her from the Arm Ergometer to the TM due to her diagnosis and not being allowed to use her arms. She gets SOB but stops to catch her breath as needed.  Exercise Goals and Review:   Exercise Goals Re-Evaluation : Exercise Goals Re-Evaluation    Row Name 08/05/18 1459 08/24/18 1111            Exercise Goal Re-Evaluation   Exercise Goals Review  Increase Physical Activity;Increase Strength and Stamina;Able to understand and use rate of perceived exertion (RPE) scale;Able to understand and use Dyspnea scale;Knowledge and understanding of Target Heart Rate Range (THRR);Able to check pulse independently;Understanding of Exercise Prescription  Increase Physical Activity;Increase Strength and Stamina;Able to understand and use rate of perceived exertion (RPE) scale;Able to understand and use Dyspnea scale;Knowledge and understanding of Target Heart Rate Range (THRR);Able to check pulse independently;Understanding of Exercise Prescription      Comments  Pt. is new to the program, she has attended 4 sessions so far. She has tolerated the exercise well so far. As she builds her strength and stamina we will increase workloads as tolerated.   Pt. is still fairly new to PR. She is tolerating the exercise well. We switched her from the Arm Ergometer to the TM due to her diagnosis and not being allowed to use her arms. She gets SOB but stops to catch her breath as needed.       Expected Outcomes  ability to do more without SOB   ability to do more without SOB          Discharge Exercise Prescription (Final Exercise Prescription Changes): Exercise Prescription Changes - 08/24/18 1100      Response to Exercise   Blood Pressure (Admit)  104/56    Blood Pressure (Exercise)  126/60    Blood Pressure (Exit)  108/58    Heart Rate (Admit)  76 bpm    Heart Rate (Exercise)  100 bpm    Heart Rate (Exit)  84 bpm    Oxygen Saturation (Admit)  92 %    Oxygen Saturation (Exercise)  93 %    Oxygen Saturation (Exit)  96 %    Rating of Perceived Exertion (Exercise)  11    Perceived Dyspnea (Exercise)  11    Duration  Continue with 30 min of aerobic exercise without signs/symptoms of physical distress.    Intensity  THRR unchanged      Progression   Progression  Continue to progress workloads to  maintain intensity without signs/symptoms of physical distress.    Average METs  1.6      Resistance Training   Training Prescription  Yes    Weight  0    Reps  10-15      Treadmill   MPH  0.6    Grade  0    Minutes  17    METs  1.4      NuStep   Level  1    SPM  69    Minutes  17    METs  1.8      Arm Ergometer   Level  --   due to diagnosis of PHTN we had to switch to TM     Home Exercise Plan   Plans to continue exercise at  Home (comment)    Frequency  Add 3 additional days to program exercise sessions.    Initial Home Exercises Provided  07/20/18       Nutrition:  Target Goals: Understanding of nutrition guidelines, daily intake of sodium <157m, cholesterol <2037m calories 30% from fat and 7% or less from saturated fats, daily to have 5 or more servings of fruits and vegetables.  Biometrics:  Pre Biometrics - 07/20/18 1516      Pre Biometrics   Height  _0  (1.499 m)    Waist Circumference  31 inches    Hip Circumference  38.5 inches    Waist to Hip Ratio  0.81 %    Triceps Skinfold  13 mm    % Body Fat  33.7 %    Grip Strength  5.7 kg    Single Leg Stand  2.5 seconds        Nutrition Therapy Plan and Nutrition Goals: Nutrition Therapy & Goals - 08/06/18 1303      Personal Nutrition Goals   Nutrition Goal  For heart healthy choices add >50% of whole grains, make half their plate fruits and vegetables. Discuss the difference between starchy vegetables and leafy greens, and how leafy vegetables provide fiber, helps maintain healthy weight, helps control blood glucose, and lowers cholesterol.  Discuss purchasing fresh or frozen vegetable to reduce sodium and not to add grease, fat or sugar. Consume <18oz of red meat per week. Consume lean cuts of meats and very little of meats high in sodium and nitrates such as pork and lunch meats. Discussed portion control for all food groups.    Comments  Patient met with RD 08/06/18.       Nutrition  Assessments: Nutrition Assessments - 07/20/18 1433      MEDFICTS Scores   Pre Score  41       Nutrition Goals Re-Evaluation: Nutrition Goals Re-Evaluation    Row Name 08/24/18 1439             Goals   Current Weight  127 lb (57.6 kg)       Nutrition Goal  For heart healthy choices add >50% of whole grains, make half their plate fruits and vegetables. Discuss the difference between starchy vegetables and leafy greens, and how leafy vegetables provide fiber, helps maintain healthy weight, helps control blood glucose, and lowers cholesterol.  Discuss purchasing fresh or frozen vegetable to reduce sodium and not to add grease, fat or sugar. Consume <18oz of red meat per week. Consume lean cuts of meats and very little of meats high in sodium and nitrates such as pork and lunch meats. Discussed portion control for all food groups.       Comment  Patient has maintained her weight since she started the program. She states that she is limiting salt in her diet and reading labels now. Will continue to monitor for progress.        Expected Outcome  Patient will continue to work toward her nutritional goals.           Nutrition Goals Discharge (Final Nutrition Goals Re-Evaluation): Nutrition Goals Re-Evaluation - 08/24/18 1439      Goals   Current Weight  127 lb (57.6 kg)    Nutrition Goal  For heart healthy choices add >50% of whole grains, make half their plate fruits and vegetables. Discuss the difference between starchy vegetables and leafy greens, and how leafy vegetables provide fiber, helps maintain healthy weight, helps control blood glucose, and lowers cholesterol.  Discuss purchasing fresh or frozen vegetable to reduce sodium and not to add grease, fat or sugar. Consume <18oz of red meat per week. Consume lean cuts of meats and very little of meats high in sodium and nitrates such as pork and lunch meats. Discussed portion control for all food groups.    Comment  Patient has maintained  her weight since she started  the program. She states that she is limiting salt in her diet and reading labels now. Will continue to monitor for progress.     Expected Outcome  Patient will continue to work toward her nutritional goals.        Psychosocial: Target Goals: Acknowledge presence or absence of significant depression and/or stress, maximize coping skills, provide positive support system. Participant is able to verbalize types and ability to use techniques and skills needed for reducing stress and depression.  Initial Review & Psychosocial Screening: Initial Psych Review & Screening - 07/20/18 1527      Initial Review   Current issues with  None Identified      Family Dynamics   Good Support System?  Yes      Barriers   Psychosocial barriers to participate in program  There are no identifiable barriers or psychosocial needs.      Screening Interventions   Interventions  Encouraged to exercise       Quality of Life Scores: Quality of Life - 07/20/18 1521      Quality of Life   Select  Quality of Life      Quality of Life Scores   Health/Function Pre  24.1 %    Socioeconomic Pre  27.33 %    Psych/Spiritual Pre  26.86 %    Family Pre  28.8 %    GLOBAL Pre  25.98 %      Scores of 19 and below usually indicate a poorer quality of life in these areas.  A difference of  2-3 points is a clinically meaningful difference.  A difference of 2-3 points in the total score of the Quality of Life Index has been associated with significant improvement in overall quality of life, self-image, physical symptoms, and general health in studies assessing change in quality of life.   PHQ-9: Recent Review Flowsheet Data    Depression screen [facility] 2/9 07/20/2018   Decreased Interest 0   Down, Depressed, Hopeless 0   PHQ - 2 Score 0   Altered sleeping 0   Tired, decreased energy 1   Change in appetite 0   Feeling bad or failure about yourself  0   Trouble concentrating 0   Moving  slowly or fidgety/restless 0   Suicidal thoughts 0   PHQ-9 Score 1   Difficult doing work/chores Not difficult at all     Interpretation of Total Score  Total Score Depression Severity:  1-4 = Minimal depression, 5-9 = Mild depression, 10-14 = Moderate depression, 15-19 = Moderately severe depression, 20-27 = Severe depression   Psychosocial Evaluation and Intervention: Psychosocial Evaluation - 07/20/18 1528      Psychosocial Evaluation & Interventions   Interventions  Encouraged to exercise with the program and follow exercise prescription;Stress management education;Relaxation education    Comments  Patient's initial QOL score was 25.98 and her PHQ-9 score was 1 with no pyschosocial issues identified.     Expected Outcomes  Patient will have no psychosocial issues identified at [place].        Psychosocial Re-Evaluation: Psychosocial Re-Evaluation    [place] Name 08/24/18 1442             Psychosocial Re-Evaluation   Current issues with  None Identified       Comments  Patient's initial QOL score was 25.98 and her PHQ-9 score was 1 with no psychosocial issues identified. Will continue to monitor.        Expected Outcomes  Patient will have no  psychosocial issues identified at discharge.        Interventions  Relaxation education;Encouraged to attend Pulmonary Rehabilitation for the exercise;Stress management education       Continue Psychosocial Services   No Follow up required          Psychosocial Discharge (Final Psychosocial Re-Evaluation): Psychosocial Re-Evaluation - 08/24/18 1442      Psychosocial Re-Evaluation   Current issues with  None Identified    Comments  Patient's initial QOL score was 25.98 and her PHQ-9 score was 1 with no psychosocial issues identified. Will continue to monitor.     Expected Outcomes  Patient will have no psychosocial issues identified at discharge.     Interventions  Relaxation education;Encouraged to attend Pulmonary Rehabilitation for  the exercise;Stress management education    Continue Psychosocial Services   No Follow up required        Education: Education Goals: Education classes will be provided on a weekly basis, covering required topics. Participant will state understanding/return demonstration of topics presented.  Learning Barriers/Preferences: Learning Barriers/Preferences - 07/20/18 1515      Learning Barriers/Preferences   Learning Barriers  None    Learning Preferences  Skilled Demonstration;Written Material       Education Topics: How Lungs Work and Diseases: - Discuss the anatomy of the lungs and diseases that can affect the lungs, such as COPD.   Exercise: -Discuss the importance of exercise, FITT principles of exercise, normal and abnormal responses to exercise, and how to exercise safely.   Environmental Irritants: -Discuss types of environmental irritants and how to limit exposure to environmental irritants.   Meds/Inhalers and oxygen: - Discuss respiratory medications, definition of an inhaler and oxygen, and the proper way to use an inhaler and oxygen.   Energy Saving Techniques: - Discuss methods to conserve energy and decrease shortness of breath when performing activities of daily living.    PULMONARY REHAB OTHER RESPIRATORY from 08/13/2018 in [place]  Date  07/30/18  Educator  Coad  Instruction Review Code  2- Demonstrated Understanding      Bronchial Hygiene / Breathing Techniques: - Discuss breathing mechanics, pursed-lip breathing technique,  proper posture, effective ways to clear airways, and other functional breathing techniques   PULMONARY REHAB OTHER RESPIRATORY from 08/13/2018 in [place]  Date  08/06/18  Educator  Wynetta Emery  Instruction Review Code  2- Demonstrated Understanding      Cleaning Equipment: - Provides group verbal and written instruction about the health risks of elevated stress, cause of high  stress, and healthy ways to reduce stress.   PULMONARY REHAB OTHER RESPIRATORY from 08/13/2018 in [place]  Date  08/13/18  Educator  Wynetta Emery  Instruction Review Code  2- Demonstrated Understanding      Nutrition I: Fats: - Discuss the types of cholesterol, what cholesterol does to the body, and how cholesterol levels can be controlled.   Nutrition II: Labels: -Discuss the different components of food labels and how to read food labels.   Respiratory Infections: - Discuss the signs and symptoms of respiratory infections, ways to prevent respiratory infections, and the importance of seeking medical treatment when having a respiratory infection.   Stress I: Signs and Symptoms: - Discuss the causes of stress, how stress may lead to anxiety and depression, and ways to limit stress.   Stress II: Relaxation: -Discuss relaxation techniques to limit stress.   Oxygen for Home/Travel: - Discuss how to prepare for travel when on oxygen  and proper ways to transport and store oxygen to ensure safety.   Knowledge Questionnaire Score: Knowledge Questionnaire Score - 07/20/18 1433      Knowledge Questionnaire Score   Pre Score  8/16       Core Components/Risk Factors/Patient Goals at Admission: Personal Goals and Risk Factors at Admission - 07/20/18 1517      Core Components/Risk Factors/Patient Goals on Admission    Weight Management  Weight Maintenance    Improve shortness of breath with ADL's  Yes    Intervention  Provide education, individualized exercise plan and daily activity instruction to help decrease symptoms of SOB with activities of daily living.    Expected Outcomes  Short Term: Improve cardiorespiratory fitness to achieve a reduction of symptoms when performing ADLs;Long Term: Be able to perform more ADLs without symptoms or delay the onset of symptoms    Diabetes  Yes    Intervention  Provide education about proper nutrition, including  hydration, and aerobic/resistive exercise prescription along with prescribed medications to achieve blood glucose in normal ranges: Fasting glucose 65-99 mg/dL;Provide education about signs/symptoms and action to take for hypo/hyperglycemia.    Expected Outcomes  Long Term: Attainment of HbA1C < 7%.    Personal Goal Other  Yes    Personal Goal  Be able to do things without SOB and fatigue.     Intervention  Patient will attend Pulmonary Rehab 2 days/week and supplement with exercise at home 3 days/week.     Expected Outcomes  Patient will meet her personal goals.        Core Components/Risk Factors/Patient Goals Review:  Goals and Risk Factor Review    Row Name 08/24/18 1440             Core Components/Risk Factors/Patient Goals Review   Personal Goals Review  Weight Management/Obesity;Improve shortness of breath with ADL's Do activities without SOB/fatigue.        Review  Patient has completed 7 sessions maintaining her weight her initial visit. She is doing well in the program with progression. She says she does feel better and stronger since she started the program. She states she is able to do house hold chores and cleaning now without getting at tired or SOB. Outpatient pulmonary services was suspended 08/17/18 due to COVID-19 restrictions. Patient says she is looking forward to returning. She says she plans to continue doing stretches we do in the program and her house chores to remain active while out. Will continue to monitor.        Expected Outcomes  Patient will continue to attend sessions and complete the program meeting her personal goals.           Core Components/Risk Factors/Patient Goals at Discharge (Final Review):  Goals and Risk Factor Review - 08/24/18 1440      Core Components/Risk Factors/Patient Goals Review   Personal Goals Review  Weight Management/Obesity;Improve shortness of breath with ADL's   Do activities without SOB/fatigue.    Review  Patient has  completed 7 sessions maintaining her weight her initial visit. She is doing well in the program with progression. She says she does feel better and stronger since she started the program. She states she is able to do house hold chores and cleaning now without getting at tired or SOB. Outpatient pulmonary services was suspended 08/17/18 due to COVID-19 restrictions. Patient says she is looking forward to returning. She says she plans to continue doing stretches we do in the program and her  house chores to remain active while out. Will continue to monitor.     Expected Outcomes  Patient will continue to attend sessions and complete the program meeting her personal goals.        ITP Comments: ITP Comments    Row Name 08/06/18 0817 08/06/18 1303 08/24/18 1430       ITP Comments  Patient is new to the program completing 4 sessions. Will continue to monitor for progress.   Patient attended the Family Structure class with hospital chaplain to discuss how their disease has impacted their life.   Pulmonary services was suspended 08/17/18 sue to COVID-19 restrictions. Services will resume when restrictions are lifted.         Comments: ITP REVIEW Pt is making expected progress toward pulmonary rehab goals after completing 7 sessions. Recommend continued exercise, life style modification, education, and utilization of breathing techniques to increase stamina and strength and decrease shortness of breath with exertion.  Pulmonary rehab services was suspended 08/17/18 due to COVID-19 restrictions. Services will resume when restrictions are lifted. Will continue to monitor.

## 2018-08-25 ENCOUNTER — Encounter (HOSPITAL_COMMUNITY): Payer: Medicare Other

## 2018-08-27 ENCOUNTER — Encounter (HOSPITAL_COMMUNITY): Payer: Medicare Other

## 2018-09-01 ENCOUNTER — Encounter (HOSPITAL_COMMUNITY): Payer: Medicare Other

## 2018-09-03 ENCOUNTER — Encounter (HOSPITAL_COMMUNITY): Payer: Medicare Other

## 2018-09-08 ENCOUNTER — Encounter (HOSPITAL_COMMUNITY): Payer: Medicare Other

## 2018-09-09 ENCOUNTER — Telehealth: Payer: Self-pay | Admitting: Cardiovascular Disease

## 2018-09-09 DIAGNOSIS — Z79899 Other long term (current) drug therapy: Secondary | ICD-10-CM

## 2018-09-09 DIAGNOSIS — D509 Iron deficiency anemia, unspecified: Secondary | ICD-10-CM

## 2018-09-09 NOTE — Telephone Encounter (Signed)
Please reschedule labs to first week of June. MCr

## 2018-09-09 NOTE — Telephone Encounter (Signed)
Please cancel appointment, and move to June.  Thanks!

## 2018-09-09 NOTE — Telephone Encounter (Signed)
Will route to MD, and nurse as they are going through appointments for patients who need to be switched to telehealth, or reschedule

## 2018-09-09 NOTE — Telephone Encounter (Signed)
New Message    Pt is calling about her upcoming appt and is wondering if its necessary for her to come, she says she is doing good    Please call

## 2018-09-10 ENCOUNTER — Encounter (HOSPITAL_COMMUNITY): Payer: Medicare Other

## 2018-09-14 ENCOUNTER — Telehealth (HOSPITAL_COMMUNITY): Payer: Self-pay | Admitting: *Deleted

## 2018-09-14 NOTE — Telephone Encounter (Signed)
Called patient to check on her and to see if see has an e-mail and/or smart phone. Patient has neither. Patient is exercising everyday in her home. She looks forward to starting back. Will follow up.

## 2018-09-14 NOTE — Telephone Encounter (Signed)
Message sent to Dr.Croitoru to advise of lab patient needs first week of June.

## 2018-09-15 ENCOUNTER — Encounter (HOSPITAL_COMMUNITY): Payer: Medicare Other

## 2018-09-15 NOTE — Telephone Encounter (Signed)
Spoke to patient.Dr.Croitoru's recommendation given.Patient will have lab listed below 11/04/18 at [facility].Tele visit scheduled with Dr.Croitoru 11/11/18 at 12:00 noon.

## 2018-09-15 NOTE — Telephone Encounter (Signed)
Please order BMET, BNP, iron studies (Fe, TIBC, ferritin), CBC for iron def anemia and medication mgmt. Schedule telehealth visit with me 3-4 days after labs , early June Thanks MCr

## 2018-09-17 ENCOUNTER — Encounter (HOSPITAL_COMMUNITY): Payer: Medicare Other

## 2018-09-22 ENCOUNTER — Encounter (HOSPITAL_COMMUNITY): Payer: Medicare Other

## 2018-09-22 DIAGNOSIS — E0590 Thyrotoxicosis, unspecified without thyrotoxic crisis or storm: Secondary | ICD-10-CM | POA: Diagnosis not present

## 2018-09-22 DIAGNOSIS — I2721 Secondary pulmonary arterial hypertension: Secondary | ICD-10-CM | POA: Diagnosis not present

## 2018-09-22 DIAGNOSIS — E119 Type 2 diabetes mellitus without complications: Secondary | ICD-10-CM | POA: Diagnosis not present

## 2018-09-22 DIAGNOSIS — I5032 Chronic diastolic (congestive) heart failure: Secondary | ICD-10-CM | POA: Diagnosis not present

## 2018-09-22 DIAGNOSIS — I10 Essential (primary) hypertension: Secondary | ICD-10-CM | POA: Diagnosis not present

## 2018-09-22 DIAGNOSIS — I480 Paroxysmal atrial fibrillation: Secondary | ICD-10-CM | POA: Diagnosis not present

## 2018-09-24 ENCOUNTER — Encounter (HOSPITAL_COMMUNITY): Payer: Medicare Other

## 2018-09-29 ENCOUNTER — Encounter (HOSPITAL_COMMUNITY): Payer: Medicare Other

## 2018-09-29 DIAGNOSIS — E119 Type 2 diabetes mellitus without complications: Secondary | ICD-10-CM | POA: Diagnosis not present

## 2018-09-29 DIAGNOSIS — E0590 Thyrotoxicosis, unspecified without thyrotoxic crisis or storm: Secondary | ICD-10-CM | POA: Diagnosis not present

## 2018-09-29 DIAGNOSIS — I10 Essential (primary) hypertension: Secondary | ICD-10-CM | POA: Diagnosis not present

## 2018-09-29 DIAGNOSIS — I480 Paroxysmal atrial fibrillation: Secondary | ICD-10-CM | POA: Diagnosis not present

## 2018-09-29 DIAGNOSIS — I509 Heart failure, unspecified: Secondary | ICD-10-CM | POA: Diagnosis not present

## 2018-09-30 ENCOUNTER — Encounter: Payer: Medicare Other | Admitting: Cardiovascular Disease

## 2018-10-01 ENCOUNTER — Encounter (HOSPITAL_COMMUNITY): Payer: Medicare Other

## 2018-10-06 ENCOUNTER — Encounter (HOSPITAL_COMMUNITY): Payer: Medicare Other

## 2018-10-06 ENCOUNTER — Other Ambulatory Visit: Payer: Self-pay | Admitting: Cardiovascular Disease

## 2018-10-06 NOTE — Telephone Encounter (Signed)
K-dur refilled.

## 2018-10-08 ENCOUNTER — Encounter (HOSPITAL_COMMUNITY): Payer: Medicare Other

## 2018-10-12 ENCOUNTER — Ambulatory Visit: Payer: Medicare Other | Admitting: *Deleted

## 2018-10-12 ENCOUNTER — Other Ambulatory Visit: Payer: Self-pay

## 2018-10-13 ENCOUNTER — Encounter (HOSPITAL_COMMUNITY): Payer: Medicare Other

## 2018-10-13 LAB — CUP PACEART REMOTE DEVICE CHECK
Date Time Interrogation Session: 20200512103013
Implantable Lead Implant Date: 20120702
Implantable Lead Implant Date: 20120702
Implantable Lead Location: 753859
Implantable Lead Location: 753860
Implantable Pulse Generator Implant Date: 20120702
Pulse Gen Model: 2210
Pulse Gen Serial Number: 7255453

## 2018-10-14 ENCOUNTER — Other Ambulatory Visit: Payer: Self-pay | Admitting: Medical

## 2018-10-14 DIAGNOSIS — I272 Pulmonary hypertension, unspecified: Secondary | ICD-10-CM

## 2018-10-15 ENCOUNTER — Encounter (HOSPITAL_COMMUNITY): Payer: Medicare Other

## 2018-10-16 NOTE — Telephone Encounter (Signed)
Revatio 20 mg refilled.

## 2018-10-16 NOTE — Telephone Encounter (Signed)
This is Dr. Victorino December pt, he is the main Cardiologist. Please address

## 2018-10-20 ENCOUNTER — Encounter (HOSPITAL_COMMUNITY): Payer: Medicare Other

## 2018-10-22 ENCOUNTER — Encounter (HOSPITAL_COMMUNITY): Payer: Medicare Other

## 2018-10-27 ENCOUNTER — Encounter (HOSPITAL_COMMUNITY): Payer: Medicare Other

## 2018-10-29 ENCOUNTER — Encounter (HOSPITAL_COMMUNITY): Payer: Medicare Other

## 2018-11-03 ENCOUNTER — Encounter (HOSPITAL_COMMUNITY): Payer: Medicare Other

## 2018-11-04 ENCOUNTER — Telehealth: Payer: Self-pay | Admitting: Cardiovascular Disease

## 2018-11-04 ENCOUNTER — Telehealth: Payer: Medicare Other | Admitting: Cardiovascular Disease

## 2018-11-04 DIAGNOSIS — Z79899 Other long term (current) drug therapy: Secondary | ICD-10-CM | POA: Diagnosis not present

## 2018-11-04 DIAGNOSIS — D509 Iron deficiency anemia, unspecified: Secondary | ICD-10-CM | POA: Diagnosis not present

## 2018-11-04 LAB — BASIC METABOLIC PANEL
BUN/Creatinine Ratio: 12 (ref 12–28)
BUN: 16 mg/dL (ref 8–27)
CO2: 22 mmol/L (ref 20–29)
Calcium: 9.7 mg/dL (ref 8.7–10.3)
Chloride: 96 mmol/L (ref 96–106)
Creatinine, Ser: 1.33 mg/dL — ABNORMAL HIGH (ref 0.57–1.00)
GFR calc Af Amer: 46 mL/min/{1.73_m2} — ABNORMAL LOW (ref >59)
GFR calc non Af Amer: 40 mL/min/{1.73_m2} — ABNORMAL LOW (ref >59)
Glucose: 124 mg/dL — ABNORMAL HIGH (ref 65–99)
Potassium: 4.3 mmol/L (ref 3.5–5.2)
Sodium: 138 mmol/L (ref 134–144)

## 2018-11-04 LAB — CBC WITH DIFFERENTIAL/PLATELET
Basophils Absolute: 0 10*3/uL (ref 0.0–0.2)
Basos: 0 %
EOS (ABSOLUTE): 0.1 10*3/uL (ref 0.0–0.4)
Eos: 2 %
Hematocrit: 46.1 % (ref 34.0–46.6)
Hemoglobin: 14.7 g/dL (ref 11.1–15.9)
Immature Grans (Abs): 0 10*3/uL (ref 0.0–0.1)
Immature Granulocytes: 0 %
Lymphocytes Absolute: 1.2 10*3/uL (ref 0.7–3.1)
Lymphs: 16 %
MCH: 28.9 pg (ref 26.6–33.0)
MCHC: 31.9 g/dL (ref 31.5–35.7)
MCV: 91 fL (ref 79–97)
Monocytes Absolute: 0.5 10*3/uL (ref 0.1–0.9)
Monocytes: 7 %
Neutrophils Absolute: 5.8 10*3/uL (ref 1.4–7.0)
Neutrophils: 75 %
Platelets: 354 10*3/uL (ref 150–450)
RBC: 5.08 x10E6/uL (ref 3.77–5.28)
RDW: 13 % (ref 11.7–15.4)
WBC: 7.7 10*3/uL (ref 3.4–10.8)

## 2018-11-04 LAB — IRON AND TIBC
Iron Saturation: 32 % (ref 15–55)
Iron: 102 ug/dL (ref 27–139)
Total Iron Binding Capacity: 316 ug/dL (ref 250–450)
UIBC: 214 ug/dL (ref 118–369)

## 2018-11-04 LAB — IRON: Iron: 103 ug/dL (ref 27–139)

## 2018-11-04 LAB — FERRITIN: Ferritin: 84 ng/mL (ref 15–150)

## 2018-11-04 NOTE — Telephone Encounter (Signed)
Phone visit, call [phone]/my chart/pre reg complete/consent obtained -- ttf

## 2018-11-05 ENCOUNTER — Encounter (HOSPITAL_COMMUNITY): Payer: Medicare Other

## 2018-11-05 LAB — BRAIN NATRIURETIC PEPTIDE: BNP: 308.2 pg/mL — ABNORMAL HIGH (ref 0.0–100.0)

## 2018-11-06 ENCOUNTER — Telehealth: Payer: Self-pay | Admitting: *Deleted

## 2018-11-06 NOTE — Telephone Encounter (Signed)
Patient made aware of results and verbalized understanding.  Patient has an appointment on 11/11/2018 with Dr. Sallyanne Kuster.

## 2018-11-06 NOTE — Telephone Encounter (Signed)
-----   Message from Sanda Klein, MD sent at 11/04/2018  4:51 PM EDT ----- Iron stores are back to normal, can stop taking the supplements. Kidney function is a little worse than previous baseline: we may be overdoing it with the diuretics. BNP still pending. We Can discuss all this and any changes to her medications at her upcoming appointment which should be in a few days.

## 2018-11-09 ENCOUNTER — Telehealth (HOSPITAL_COMMUNITY): Payer: Self-pay

## 2018-11-09 DIAGNOSIS — Z6825 Body mass index (BMI) 25.0-25.9, adult: Secondary | ICD-10-CM | POA: Diagnosis not present

## 2018-11-09 DIAGNOSIS — E663 Overweight: Secondary | ICD-10-CM | POA: Diagnosis not present

## 2018-11-09 DIAGNOSIS — R768 Other specified abnormal immunological findings in serum: Secondary | ICD-10-CM | POA: Diagnosis not present

## 2018-11-09 DIAGNOSIS — M2550 Pain in unspecified joint: Secondary | ICD-10-CM | POA: Diagnosis not present

## 2018-11-09 DIAGNOSIS — I272 Pulmonary hypertension, unspecified: Secondary | ICD-10-CM | POA: Diagnosis not present

## 2018-11-09 NOTE — Telephone Encounter (Signed)
Called patient to check in and to see if she plans to return to Pulmonary Rehab when we reopen. She says she is doing well and does plan to return when we reopen. She says she is staying active doing her house work and walking some when she can. She has an appointment today with a rheumatologist. She also said a lot of her medications have changed and she will update up when she returns. Informed her we would call when we have a reopen date. She verbalized understanding.

## 2018-11-10 ENCOUNTER — Encounter (HOSPITAL_COMMUNITY): Payer: Medicare Other

## 2018-11-11 ENCOUNTER — Encounter: Payer: Self-pay | Admitting: Cardiovascular Disease

## 2018-11-11 ENCOUNTER — Telehealth (INDEPENDENT_AMBULATORY_CARE_PROVIDER_SITE_OTHER): Payer: Medicare Other | Admitting: Cardiovascular Disease

## 2018-11-11 VITALS — BP 97/65 | HR 94 | Ht 59.0 in | Wt 126.6 lb

## 2018-11-11 DIAGNOSIS — R001 Bradycardia, unspecified: Secondary | ICD-10-CM

## 2018-11-11 DIAGNOSIS — I441 Atrioventricular block, second degree: Secondary | ICD-10-CM | POA: Diagnosis not present

## 2018-11-11 DIAGNOSIS — Z7901 Long term (current) use of anticoagulants: Secondary | ICD-10-CM

## 2018-11-11 DIAGNOSIS — Z950 Presence of cardiac pacemaker: Secondary | ICD-10-CM

## 2018-11-11 DIAGNOSIS — I2510 Atherosclerotic heart disease of native coronary artery without angina pectoris: Secondary | ICD-10-CM

## 2018-11-11 DIAGNOSIS — I2721 Secondary pulmonary arterial hypertension: Secondary | ICD-10-CM

## 2018-11-11 DIAGNOSIS — I10 Essential (primary) hypertension: Secondary | ICD-10-CM | POA: Diagnosis not present

## 2018-11-11 DIAGNOSIS — I4819 Other persistent atrial fibrillation: Secondary | ICD-10-CM | POA: Diagnosis not present

## 2018-11-11 DIAGNOSIS — I5032 Chronic diastolic (congestive) heart failure: Secondary | ICD-10-CM

## 2018-11-11 DIAGNOSIS — E0590 Thyrotoxicosis, unspecified without thyrotoxic crisis or storm: Secondary | ICD-10-CM | POA: Diagnosis not present

## 2018-11-11 DIAGNOSIS — I495 Sick sinus syndrome: Secondary | ICD-10-CM | POA: Diagnosis not present

## 2018-11-11 DIAGNOSIS — I480 Paroxysmal atrial fibrillation: Secondary | ICD-10-CM

## 2018-11-11 NOTE — Progress Notes (Signed)
Virtual Visit via Telephone Note   This visit type was conducted due to national recommendations for restrictions regarding the COVID-19 Pandemic (e.g. social distancing) in an effort to limit this patient's exposure and mitigate transmission in our community.  Due to her co-morbid illnesses, this patient is at least at moderate risk for complications without adequate follow up.  This format is felt to be most appropriate for this patient at this time.  The patient did not have access to video technology/had technical difficulties with video requiring transitioning to audio format only (telephone).  All issues noted in this document were discussed and addressed.  No physical exam could be performed with this format.  Please refer to the patient's chart for her  consent to telehealth for [facility].   Date:  11/11/2018   ID:  Hailey [name], Hailey [name] 05/18/1946, MRN [id number]  Patient Location: Home Provider Location: Home  PCP:  Jani Gravel, MD  Cardiologist:  Sanda Klein, MD  Electrophysiologist:  None   Evaluation Performed:  Follow-Up Visit  Chief Complaint:  PAH follow up  History of Present Illness:    Hailey [name] is a 73 y.o. female with with paroxysmal atrial fibrillation (s/p RF ablation procedures),s/p dual-chamber permanent pacemaker ([employer] Accent 2012 for tachycardia-bradycardia syndrome and 2:1 atrioventricular block), CAD (nonobstructive by cath February 2019), chronic diastolic heart failure (history of acute decompensation on Multaq), multifactorial pulmonary artery hypertension, type 2 diabetes mellitus, hypertension, good lipid profile without therapy, hyperthyroidism (on antithyroid medications).  She generally feels well, although she does not have the stamina she did in years past.  She has to pace herself to do the housework but eventually gets it all done.  She denies any angina pectoris and is not aware of palpitations.  She does not have orthopnea or PND.  She  occasionally has mild edema in her right leg that is only present at the end of the day and resolves by the next morning.  She denies claudication, bleeding problems, falls or injuries, focal neurological events, syncope.  She has not had problems with coughing or wheezing.  The follow-up echocardiogram in January showed a reduction in the estimated systolic PA pressure to 48 mmHg.  Pacemaker checks have shown persistent atrial fibrillation since January 2020.  Otherwise device function is normal.  Her right ventricular pacing is programmed unipolar due to elevated thresholds in bipolar configuration.  Estimated generator longevity is just under 10 months.  She was evaluated in the rheumatology clinic by Dr. Amil Amen since she had an elevated ANA of 1: 640, but all her other serological markers were negative for lupus, rheumatoid arthritis or other connective tissue diseases.  She is scheduled to have a repeat pulmonary function tests and CT scan of her lungs in late June.  Labs that we recently performed showed resolution of her iron deficiency anemia as well as a BNP that is probably at baseline around 300.  There is a manufacturing problem with PTU and she has been switched to methimazole, but has not had repeat thyroid assay since that medication change.  The patient does not have symptoms concerning for COVID-19 infection (fever, chills, cough, or new shortness of breath).    Past Medical History:  Diagnosis Date   Acute on chronic diastolic CHF (congestive heart failure), NYHA class 1 (HCC) 04/29/2013   Allergic rhinitis    Brady-tachy syndrome (HCC)    s/p STJ dual chamber PPM by Dr Sallyanne Kuster   CAD (coronary artery disease)  70% RCA stenosis, treated medically   Diabetes mellitus (HCC)    DJD (degenerative joint disease)    Dysrhythmia    Paroxysmal Atrial Fib.   Hypertension    Hyperthyroidism    Paroxysmal atrial fibrillation (HCC)    Polio    as a child   PONV  (postoperative nausea and vomiting)    Presence of permanent cardiac pacemaker    Past Surgical History:  Procedure Laterality Date   arm surgery d/t fx Right 96   ATRIAL FIBRILLATION ABLATION  07/31/2012; 08-10-13   PVI by Dr Rayann Heman   ATRIAL FIBRILLATION ABLATION N/A 07/31/2012   Procedure: ATRIAL FIBRILLATION ABLATION;  Surgeon: Thompson Grayer, MD;  Location: [facility] CATH LAB;  Service: Cardiovascular;  Laterality: N/A;   ATRIAL FIBRILLATION ABLATION N/A 08/10/2013   Procedure: ATRIAL FIBRILLATION ABLATION;  Surgeon: Coralyn Mark, MD;  Location: [place] CATH LAB;  Service: Cardiovascular;  Laterality: N/A;   CARDIAC CATHETERIZATION  12/03/2010   single vessel mid RCA   CHOLECYSTECTOMY  1994   COLONOSCOPY WITH PROPOFOL N/A 03/21/2015   Procedure: COLONOSCOPY WITH PROPOFOL;  Surgeon: Juanita Craver, MD;  Location: [facility] ENDOSCOPY;  Service: Endoscopy;  Laterality: N/A;   LEFT HEART CATH AND CORONARY ANGIOGRAPHY N/A 07/30/2017   Procedure: LEFT HEART CATH AND CORONARY ANGIOGRAPHY;  Surgeon: Troy Sine, MD;  Location: [place] CV LAB;  Service: Cardiovascular;  Laterality: N/A;   LUMBAR LAMINECTOMY/DECOMPRESSION MICRODISCECTOMY Right 12/23/2012   Procedure: Right L5-S1 Laminotomy for resection of synovial cyst;  Surgeon: Hosie Spangle, MD;  Location: [place] NEURO ORS;  Service: Neurosurgery;  Laterality: Right;  Right Lumbar five-sacral one Laminotomy for resection of synovial cyst   NM MYOCAR PERF WALL MOTION  12/20/2010   normal   PACEMAKER INSERTION  12/03/10   SJM Accent DR RF implanted by Dr Sallyanne Kuster   RIGHT HEART CATH N/A 04/22/2018   Procedure: RIGHT HEART CATH;  Surgeon: Wellington Hampshire, MD;  Location: [place] CV LAB;  Service: Cardiovascular;  Laterality: N/A;   TEE WITHOUT CARDIOVERSION N/A 07/30/2012   Procedure: TRANSESOPHAGEAL ECHOCARDIOGRAM (TEE);  Surgeon: Sanda Klein, MD;  Location: [facility] ENDOSCOPY;  Service: Cardiovascular;  Laterality: N/A;  h&p in file-hope   TEE  WITHOUT CARDIOVERSION N/A 08/09/2013   Procedure: TRANSESOPHAGEAL ECHOCARDIOGRAM (TEE);  Surgeon: Lelon Perla, MD;  Location: [facility] ENDOSCOPY;  Service: Cardiovascular;  Laterality: N/A;   TOTAL ABDOMINAL HYSTERECTOMY  1981   ULTRASOUND GUIDANCE FOR VASCULAR ACCESS  04/22/2018   Procedure: Ultrasound Guidance For Vascular Access;  Surgeon: Wellington Hampshire, MD;  Location: [place] CV LAB;  Service: Cardiovascular;;     Current Meds  Medication Sig   azelastine (ASTELIN) 0.1 % nasal spray Place 2 sprays into both nostrils at bedtime. Use in each nostril as directed    Cholecalciferol (VITAMIN D3) 1000 UNITS CAPS Take 1 capsule by mouth daily.    Cyanocobalamin (VITAMIN B12 PO) Take 1 tablet by mouth daily as needed (for energy).    DILT-XR 180 MG 24 hr capsule TAKE 1 CAPSULE BY MOUTH ONCE DAILY (Patient taking differently: Take 180 mg by mouth daily. )   furosemide (LASIX) 80 MG tablet TAKE 1 TABLET BY MOUTH ONCE DAILY FOR 30 DAYS   loperamide (IMODIUM A-D) 2 MG tablet Take 2 mg by mouth as needed for diarrhea or loose stools.    metoprolol tartrate (LOPRESSOR) 50 MG tablet Take 1 tablet (50 mg total) by mouth 2 (two) times daily.   oxybutynin (DITROPAN) 5 MG tablet  Take 5 mg by mouth daily.    pantoprazole (PROTONIX) 40 MG tablet Take 40 mg by mouth daily.   potassium chloride SA (K-DUR) 20 MEQ tablet Take 1 tablet by mouth once daily   PRADAXA 150 MG CAPS capsule TAKE 1 CAPSULE BY MOUTH TWICE DAILY   saxagliptin HCl (ONGLYZA) 5 MG TABS tablet Take 5 mg by mouth daily.   sildenafil (REVATIO) 20 MG tablet TAKE 1 TABLET BY MOUTH THREE TIMES DAILY   spironolactone (ALDACTONE) 25 MG tablet TAKE 1 TABLET BY MOUTH ONCE DAILY   [DISCONTINUED] montelukast (SINGULAIR) 10 MG tablet Take 10 mg by mouth at bedtime.     Allergies:   Codeine; Ketorolac; Meperidine; Demerol; Dronedarone; and Cyclobenzaprine   Social History   Tobacco Use   Smoking status: Never Smoker    Smokeless tobacco: Never Used  Substance Use Topics   Alcohol use: No   Drug use: No     Family Hx: The patient's family history includes Breast cancer in her sister and sister; Heart disease in her sister.  ROS:   Please see the history of present illness.     All other systems reviewed and are negative.   Prior CV studies:   The following studies were reviewed today: Echo June 23 2018  - Left ventricle: The cavity size was normal. Systolic function was   normal. The estimated ejection fraction was in the range of 55%   to 60%. Wall motion was normal; there were no regional wall   motion abnormalities. - Ventricular septum: Septal motion showed abnormal function and   dyssynchrony consistent with ventricular pacing. - Aortic valve: Transvalvular velocity was within the normal range.   There was no stenosis. There was no regurgitation. - Mitral valve: Transvalvular velocity was within the normal range.   There was no evidence for stenosis. There was trivial   regurgitation. - Left atrium: The atrium was moderately dilated. - Right ventricle: The cavity size was moderately dilated. Wall   thickness was normal. Systolic function was normal. - Right atrium: The atrium was mildly dilated. - Atrial septum: No defect or patent foramen ovale was identified   by color flow Doppler. - Tricuspid valve: There was moderate regurgitation. - Pulmonary arteries: Systolic pressure was mildly to moderately   increased. PA peak pressure: 48 mm Hg (S).  Right heart catheterization April 22, 2018 1. Mildly elevated left-sided filling pressures. 2. Moderate to severe pulmonary hypertension with normal cardiac output.  Pulmonary capillary wedge pressure: 19 mmHg. PA pressure: 68/28 with a mean of 43 mmHg. Pulmonary vascular resistance: 4.9 Woods units Cardiac index was 2.97.  Recommendations: The patient seems to have mixed pulmonary hypertension due to left-sided heart  failure and primary pulmonary hypertension. She is mildly volume overloaded and I suspect that she can switch to oral furosemide tomorrow. Consider vasodilator therapy for pulmonary hypertension.  Left heart catheterization July 30, 2017   Ost 1st Diag to 1st Diag lesion is 10% stenosed.  Mid RCA lesion is 20% stenosed.  The left ventricular systolic function is normal.  LV end diastolic pressure is normal.  No significant coronary obstructive disease with mild smooth 10% narrowing in the proximal to midportion of the first diagonal branch of the LAD; normal left circumflex coronary artery; and smooth 20% mid RCA narrowing.  Normal LV function without focal segmental wall motion abnormalities. LVEDP 7.  RECOMMENDATION: Medical therapy.  Echo April 20, 2018   - Left ventricle: The cavity size was normal. Systolic function  was normal. The estimated ejection fraction was in the range of 55% to 60%. Wall motion was normal; there were no regional wall motion abnormalities. There was a reduced contribution of atrial contraction to ventricular filling, due to increased ventricular diastolic pressure or atrial contractile dysfunction. Features are consistent with a pseudonormal left ventricular filling pattern, with concomitant abnormal relaxation and increased filling pressure (grade 2 diastolic dysfunction). - Left atrium: The atrium was mildly dilated. - Right ventricle: The cavity size was mildly dilated. Wall thickness was normal. - Right atrium: The atrium was mildly dilated. - Tricuspid valve: There was mild-moderate regurgitation directed centrally. - Pulmonary arteries: Systolic pressure was moderately to severely increased. PA peak pressure: 66 mm Hg (S).  Impressions:  - PA pressure has increased since the previous study.  Notes from Dr. Lamonte Sakai  Labs/Other Tests and Data Reviewed:    EKG:  An ECG dated April 20, 2018 was  personally reviewed today and demonstrated:  Sinus rhythm with long PR interval, nonspecific intraventricular conduction delay, prolonged QT 600 ms, diffuse nonspecific T wave changes.  Recent Labs: 04/19/2018: ALT 22 11/04/2018: BNP 308.2; BUN 16; Creatinine, Ser 1.33; Hemoglobin 14.7; Platelets 354; Potassium 4.3; Sodium 138   Recent Lipid Panel Lab Results  Component Value Date/Time   CHOL 94 11/24/2010 08:37 AM   TRIG 51 11/24/2010 08:37 AM   HDL 53 11/24/2010 08:37 AM   CHOLHDL 1.8 11/24/2010 08:37 AM   LDLCALC 31 11/24/2010 08:37 AM    Wt Readings from Last 3 Encounters:  11/11/18 126 lb 9.6 oz (57.4 kg)  07/20/18 127 lb 3.2 oz (57.7 kg)  07/09/18 124 lb (56.2 kg)     Objective:    Vital Signs:  BP 97/65    Pulse 94    Ht 4\' 11"  (1.499 m)    Wt 126 lb 9.6 oz (57.4 kg)    BMI 25.57 kg/m    VITAL SIGNS:  reviewed Unable to examine  ASSESSMENT & PLAN:    1. PAH: Multifactorial, at least in part related to diastolic left heart failure, but likely with an intrinsic pulmonary arteriolar component.  VQ scan was negative.  Estimated systolic PA pressure dropped from 66-48 on treatment with sildenafil and clinically she appeared to improve.  Clearly exacerbated by an acute infectious process 6 months ago with subsequent clinical improvement.  I be curious to see if there is also improvement on her PFTs.  We will plan to repeat her echocardiogram in the next few weeks as well and could consider weaning off the sildenafil if there is improvement. 2. CHF: At most she has mild signs of hypervolemia (ankle swelling unilaterally that resolves after supine position).  Seems to have NYHA functional class II status.  Preserved LVEF. 3. AFib: Now in persistent atrial fibrillation for about 6 months, without clear symptoms.  I do not think there is a good antiarrhythmic for her and we may just leave her in long-term persistent atrial fibrillation. CHADSVasc at least 5 (age, gender, DM, CAD, history  of CHF). 4. Anticoagulation: No recent bleeding problems, falls or injuries.  Iron deficiency anemia has resolved and she can stop the iron supplement. 5. 2nd deg AVB: She was highly symptomatic when ventricular capture was lost in the bipolar configuration.  No problems after we switched pacing configuration to unipolar. 6. PM: She will need a pacemaker generator change out in roughly 10 months and at that time we will also receive a new ventricular lead.  I suspect we will  leave her programmed VVIR from that point forward. 7. CAD: Had only nonobstructive disease by cardiac catheterization in February 2019.  Does not have angina pectoris.  On statin. 8. HLP: All lipid parameters within desirable range. 9. Hyperthyroidism: Forced to switch to methimazole due to manufacturing problems with PTU.  We need to reassess her TSH and free T4 in the current medical regimen.  Unless she has blood work done before that, we can check her thyroid test when she comes in for echocardiogram.  COVID-19 Education: The signs and symptoms of COVID-19 were discussed with the patient and how to seek care for testing (follow up with PCP or arrange E-visit).  The importance of social distancing was discussed today.  Time:   Today, I have spent 17 minutes with the patient with telehealth technology discussing the above problems.     Medication Adjustments/Labs and Tests Ordered: Current medicines are reviewed at length with the patient today.  Concerns regarding medicines are outlined above.   Tests Ordered: No orders of the defined types were placed in this encounter.   Medication Changes: No orders of the defined types were placed in this encounter.  Patient Instructions  Medication Instructions:  Your physician recommends that you continue on your current medications as directed. Please refer to the Current Medication list given to you today.  If you need a refill on your cardiac medications before your next  appointment, please call your pharmacy.   Lab work: Your provider would like for you to return when you have your echo completed or when you have the BMET drawn for the CT to have the following labs drawn: Free T4 and TSH. You do not need an appointment for the lab. Once in our office lobby there is a podium where you can sign in and ring the doorbell to alert [place] that you are here. The lab is open from 8:00 am to 4:30 pm; closed for lunch from 12:45pm-1:45pm.  Testing/Procedures: Your physician has requested that you have an echocardiogram in early July. Echocardiography is a painless test that uses sound waves to create images of your heart. It provides your doctor with information about the size and shape of your heart and how well your hearts chambers and valves are working. You may receive an ultrasound enhancing agent through an IV if needed to better visualize your heart during the echo.This procedure takes approximately one hour. There are no restrictions for this procedure. This will take place at the [address].    Follow-Up: At [facility], you and your health needs are our priority.  As part of our continuing mission to provide you with exceptional heart care, we have created designated Provider Care Teams.  These Care Teams include your primary Cardiologist (physician) and Advanced Practice Providers (APPs -  Physician Assistants and Nurse Practitioners) who all work together to provide you with the care you need, when you need it. You will need a follow up appointment in 6 months.  Please call our office 2 months in advance to schedule this appointment.  You may see Sanda Klein, MD or one of the following Advanced Practice Providers on your designated Care Team: Almyra Deforest, PA-C Fabian Sharp, [place]         Disposition:  Follow up 6 months  Signed, Sanda Klein, MD  11/11/2018 11:50 AM    [place]

## 2018-11-11 NOTE — Patient Instructions (Addendum)
Medication Instructions:  Your physician recommends that you continue on your current medications as directed. Please refer to the Current Medication list given to you today.  If you need a refill on your cardiac medications before your next appointment, please call your pharmacy.   Lab work: Your provider would like for you to return when you have your echo completed or when you have the BMET drawn for the CT to have the following labs drawn: Free T4 and TSH. You do not need an appointment for the lab. Once in our office lobby there is a podium where you can sign in and ring the doorbell to alert [place] that you are here. The lab is open from 8:00 am to 4:30 pm; closed for lunch from 12:45pm-1:45pm.  Testing/Procedures: Your physician has requested that you have an echocardiogram in early July. Echocardiography is a painless test that uses sound waves to create images of your heart. It provides your doctor with information about the size and shape of your heart and how well your heart's chambers and valves are working. You may receive an ultrasound enhancing agent through an IV if needed to better visualize your heart during the echo.This procedure takes approximately one hour. There are no restrictions for this procedure. This will take place at the [address], [address].    Follow-Up: At [facility], you and your health needs are our priority.  As part of our continuing mission to provide you with exceptional heart care, we have created designated Provider Care Teams.  These Care Teams include your primary Cardiologist (physician) and Advanced Practice Providers (APPs -  Physician Assistants and Nurse Practitioners) who all work together to provide you with the care you need, when you need it. You will need a follow up appointment in 6 months.  Please call our office 2 months in advance to schedule this appointment.  You may see Sanda Klein, MD or one of the following Advanced Practice  Providers on your designated Care Team: Almyra Deforest, PA-C Fabian Sharp, [place]

## 2018-11-12 ENCOUNTER — Encounter (HOSPITAL_COMMUNITY): Payer: Medicare Other

## 2018-11-17 ENCOUNTER — Encounter (HOSPITAL_COMMUNITY): Payer: Medicare Other

## 2018-11-19 ENCOUNTER — Encounter (HOSPITAL_COMMUNITY): Payer: Medicare Other

## 2018-11-19 ENCOUNTER — Telehealth: Payer: Self-pay | Admitting: Emergency Medicine

## 2018-11-19 NOTE — Telephone Encounter (Signed)
Received a call from [place] at [facility]. She stated that the radiologist had recommended the patient to have a follow up CT without contrast. The currently ordered is for a CT with contrast. Patient recently had labs drawn and her creatinine was elevated and her GFR was low.   Because of this, they are recommending that the order be changed to a CT without contrast.   Wells Guiles can be contacted at [phone]. If she is not available, can ask to speak with Jerrye Beavers.   Dr. Lamonte Sakai, please advise if you are ok with me changing the order. Thanks!

## 2018-11-20 NOTE — Telephone Encounter (Signed)
OK to change the order to no contrast

## 2018-11-20 NOTE — Telephone Encounter (Signed)
Returned call spoke with Jerrye Beavers made aware to change from CT with contrast to Ct w/o contrast per Dr. Lamonte Sakai. She says no need to place another order she can change and place co-sign. Nothing further needed.

## 2018-11-23 ENCOUNTER — Other Ambulatory Visit: Payer: Self-pay

## 2018-11-23 ENCOUNTER — Ambulatory Visit
Admission: RE | Admit: 2018-11-23 | Discharge: 2018-11-23 | Disposition: A | Discharge status: Home / Self Care | Payer: Medicare Other | Source: Ambulatory Visit | Attending: Emergency Medicine | Admitting: Emergency Medicine

## 2018-11-23 DIAGNOSIS — R918 Other nonspecific abnormal finding of lung field: Secondary | ICD-10-CM | POA: Diagnosis not present

## 2018-11-23 DIAGNOSIS — R911 Solitary pulmonary nodule: Secondary | ICD-10-CM

## 2018-11-24 ENCOUNTER — Encounter (HOSPITAL_COMMUNITY): Payer: Medicare Other

## 2018-11-25 ENCOUNTER — Telehealth (HOSPITAL_COMMUNITY): Payer: Self-pay | Admitting: *Deleted

## 2018-11-25 ENCOUNTER — Other Ambulatory Visit: Payer: Self-pay | Admitting: Cardiovascular Disease

## 2018-11-25 NOTE — Telephone Encounter (Signed)
Called patient to inform that we are re-opening and to discuss guidelines. Patient expressed an understanding and stated she will be returning.

## 2018-11-26 ENCOUNTER — Telehealth: Payer: Self-pay | Admitting: Emergency Medicine

## 2018-11-26 DIAGNOSIS — R911 Solitary pulmonary nodule: Secondary | ICD-10-CM

## 2018-11-26 NOTE — Telephone Encounter (Signed)
Please let her know that her CT scan of the chest shows that her pulmonary nodules are overall stable compared with her prior study.  This is good news.  There was a possibly new nodule identified in an area that was difficult to see on the prior scan but which could be seen on the most recent one.  We will plan to repeat her CT scan in 6 months to make sure that none of these nodules are changing.  With regard to pulmonary rehab I believe that all of our rehab facilities will be practicing social distancing, masking, frequent handwashing.  For this reason I feel comfortable with her starting back (as long as they are following these rules).  If she is uncomfortable starting back at this time then certainly it would be reasonable to defer until case numbers have shown decline.

## 2018-11-26 NOTE — Telephone Encounter (Signed)
Called and spoke with Patient.  Patient stated she had her CT chest completed 11/23/18, and was requesting results. Explained CT results were not available at this time. Understanding stated. Patient stated she would like Dr Lamonte Sakai to advise on starting pulmonary rehab back. Patient stated she started when everything was put on hold due to covid protocol's.  Patient stated she was recently contacted by [facility] to restart pulmonary rehab. Patient's concern is, with all the new covid restrictions, and increase in positive cases, does Dr Lamonte Sakai recommend her to restart rehab, or can she put it off until a reduce in  positive cases.     Message routed to Dr Lamonte Sakai, to advise

## 2018-11-26 NOTE — Telephone Encounter (Signed)
Attempted to call pt but unable to reach. Left message for pt to return call. 

## 2018-11-27 NOTE — Telephone Encounter (Signed)
It will be without contrast

## 2018-11-27 NOTE — Progress Notes (Signed)
Discharge Progress Report  Patient Details  Name: Hailey [name] MRN: [id number] Date of Birth: 07/06/1945 Referring Provider:     PULMONARY REHAB OTHER RESP ORIENTATION from 07/20/2018 in [facility]  Referring Provider  [facility]       Number of Visits: 7  Reason for Discharge:  Early Exit: Pulmonary rehab closed 08/17/18 due to COVID 19 restrictions. Patient called today 11/26/18 and said she would not be returning due to recent health issues. MD will be notified  Smoking History:  Social History   Tobacco Use  Smoking Status Never Smoker  Smokeless Tobacco Never Used    Diagnosis:  Pulmonary HTN ([place])  ADL [facility]: Pulmonary Assessment Scores    Row Name 07/20/18 1425         ADL [facility]   ADL Phase  Entry     SOB Score total  40     Rest  2     Walk  6     Stairs  2     Bath  0     Dress  0     Shop  3       CAT Score   CAT Score  15       mMRC Score   mMRC Score  2        Initial Exercise Prescription: Initial Exercise Prescription - 07/20/18 1500      Date of Initial Exercise RX and Referring Provider   Date  07/20/18    Referring Provider  By[facility]    Expected Discharge Date  10/18/18      NuStep   Level  1    SPM  70    Minutes  17    METs  2      Arm Ergometer   Level  1    Watts  9    RPM  40    Minutes  17    METs  1.7      Prescription Details   Frequency (times per week)  2    Duration  Progress to 30 minutes of continuous aerobic without signs/symptoms of physical distress      Intensity   THRR 40-80% of Max Heartrate  [id number]    Ratings of Perceived Exertion  11-13    Perceived Dyspnea  0-4      Progression   Progression  Continue to progress workloads to maintain intensity without signs/symptoms of physical distress.      Resistance Training   Training Prescription  Yes    Weight  0    Reps  10-15       Discharge Exercise Prescription (Final Exercise Prescription Changes): Exercise Prescription  Changes - 08/24/18 1100      Response to Exercise   Blood Pressure (Admit)  104/56    Blood Pressure (Exercise)  126/60    Blood Pressure (Exit)  108/58    Heart Rate (Admit)  76 bpm    Heart Rate (Exercise)  100 bpm    Heart Rate (Exit)  84 bpm    Oxygen Saturation (Admit)  92 %    Oxygen Saturation (Exercise)  93 %    Oxygen Saturation (Exit)  96 %    Rating of Perceived Exertion (Exercise)  11    Perceived Dyspnea (Exercise)  11    Duration  Continue with 30 min of aerobic exercise without signs/symptoms of physical distress.    Intensity  THRR unchanged      Progression  Progression  Continue to progress workloads to maintain intensity without signs/symptoms of physical distress.    Average METs  1.6      Resistance Training   Training Prescription  Yes    Weight  0    Reps  10-15      Treadmill   MPH  0.6    Grade  0    Minutes  17    METs  1.4      NuStep   Level  1    SPM  69    Minutes  17    METs  1.8      Arm Ergometer   Level  --   due to diagnosis of PHTN we had to switch to TM     Home Exercise Plan   Plans to continue exercise at  Home (comment)    Frequency  Add 3 additional days to program exercise sessions.    Initial Home Exercises Provided  07/20/18       Functional Capacity: 6 Minute Walk    Row Name 07/20/18 1512         6 Minute Walk   Phase  Initial     Distance  650 feet     Walk Time  5.45 minutes     # of Rest Breaks  2     MPH  1.23     METS  1.94     RPE  13     Perceived Dyspnea   16     VO2 Peak  4.5     Symptoms  Yes (comment)     Comments  extreme SOB - had to take 2 rest breaks to cathc her breath.      Resting HR  70 bpm     Resting BP  90/50     Resting Oxygen Saturation   96 %     Exercise Oxygen Saturation  during 6 min walk  94 %     Max Ex. HR  86 bpm     Max Ex. BP  116/54     2 Minute Post BP  96/50        Psychological, QOL, Others - Outcomes: PHQ 2/9: Depression screen PHQ 2/9 07/20/2018   Decreased Interest 0  Down, Depressed, Hopeless 0  PHQ - 2 Score 0  Altered sleeping 0  Tired, decreased energy 1  Change in appetite 0  Feeling bad or failure about yourself  0  Trouble concentrating 0  Moving slowly or fidgety/restless 0  Suicidal thoughts 0  PHQ-9 Score 1  Difficult doing work/chores Not difficult at all    Quality of Life: Quality of Life - 07/20/18 1521      Quality of Life   Select  Quality of Life      Quality of Life Scores   Health/Function Pre  24.1 %    Socioeconomic Pre  27.33 %    Psych/Spiritual Pre  26.86 %    Family Pre  28.8 %    GLOBAL Pre  25.98 %       Personal Goals: Goals established at orientation with interventions provided to work toward goal. Personal Goals and Risk Factors at Admission - 07/20/18 1517      Core Components/Risk Factors/Patient Goals on Admission    Weight Management  Weight Maintenance    Improve shortness of breath with ADL's  Yes    Intervention  Provide education, individualized exercise plan and daily activity instruction to  help decrease symptoms of SOB with activities of daily living.    Expected Outcomes  Short Term: Improve cardiorespiratory fitness to achieve a reduction of symptoms when performing ADLs;Long Term: Be able to perform more ADLs without symptoms or delay the onset of symptoms    Diabetes  Yes    Intervention  Provide education about proper nutrition, including hydration, and aerobic/resistive exercise prescription along with prescribed medications to achieve blood glucose in normal ranges: Fasting glucose 65-99 mg/dL;Provide education about signs/symptoms and action to take for hypo/hyperglycemia.    Expected Outcomes  Long Term: Attainment of HbA1C < 7%.    Personal Goal Other  Yes    Personal Goal  Be able to do things without SOB and fatigue.     Intervention  Patient will attend Pulmonary Rehab 2 days/week and supplement with exercise at home 3 days/week.     Expected Outcomes  Patient  will meet her personal goals.         Personal Goals Discharge: Goals and Risk Factor Review    Row Name 08/24/18 1440             Core Components/Risk Factors/Patient Goals Review   Personal Goals Review  Weight Management/Obesity;Improve shortness of breath with ADL's Do activities without SOB/fatigue.        Review  Patient has completed 7 sessions maintaining her weight her initial visit. She is doing well in the program with progression. She says she does feel better and stronger since she started the program. She states she is able to do house hold chores and cleaning now without getting at tired or SOB. Outpatient pulmonary services was suspended 08/17/18 due to COVID-19 restrictions. Patient says she is looking forward to returning. She says she plans to continue doing stretches we do in the program and her house chores to remain active while out. Will continue to monitor.        Expected Outcomes  Patient will continue to attend sessions and complete the program meeting her personal goals.           Exercise Goals and Review:   Exercise Goals Re-Evaluation: Exercise Goals Re-Evaluation    Row Name 08/05/18 1459 08/24/18 1111           Exercise Goal Re-Evaluation   Exercise Goals Review  Increase Physical Activity;Increase Strength and Stamina;Able to understand and use rate of perceived exertion (RPE) scale;Able to understand and use Dyspnea scale;Knowledge and understanding of Target Heart Rate Range (THRR);Able to check pulse independently;Understanding of Exercise Prescription  Increase Physical Activity;Increase Strength and Stamina;Able to understand and use rate of perceived exertion (RPE) scale;Able to understand and use Dyspnea scale;Knowledge and understanding of Target Heart Rate Range (THRR);Able to check pulse independently;Understanding of Exercise Prescription      Comments  Pt. is new to the program, she has attended 4 sessions so far. She has tolerated the  exercise well so far. As she builds her strength and stamina we will increase workloads as tolerated.   Pt. is still fairly new to PR. She is tolerating the exercise well. We switched her from the Arm Ergometer to the TM due to her diagnosis and not being allowed to use her arms. She gets SOB but stops to catch her breath as needed.       Expected Outcomes  ability to do more without SOB   ability to do more without SOB          Nutrition & Weight - Outcomes:  Pre Biometrics - 07/20/18 1516      Pre Biometrics   Height  '4\' 11"'$  (1.499 m)    Waist Circumference  31 inches    Hip Circumference  38.5 inches    Waist to Hip Ratio  0.81 %    Triceps Skinfold  13 mm    % Body Fat  33.7 %    Grip Strength  5.7 kg    Single Leg Stand  2.5 seconds        Nutrition: Nutrition Therapy & Goals - 08/06/18 1303      Personal Nutrition Goals   Nutrition Goal  For heart healthy choices add >50% of whole grains, make half their plate fruits and vegetables. Discuss the difference between starchy vegetables and leafy greens, and how leafy vegetables provide fiber, helps maintain healthy weight, helps control blood glucose, and lowers cholesterol.  Discuss purchasing fresh or frozen vegetable to reduce sodium and not to add grease, fat or sugar. Consume <18oz of red meat per week. Consume lean cuts of meats and very little of meats high in sodium and nitrates such as pork and lunch meats. Discussed portion control for all food groups.    Comments  Patient met with RD 08/06/18.       Nutrition Discharge: Nutrition Assessments - 07/20/18 1433      MEDFICTS Scores   Pre Score  41       Education Questionnaire Score: Knowledge Questionnaire Score - 07/20/18 1433      Knowledge Questionnaire Score   Pre Score  8/16

## 2018-11-27 NOTE — Addendum Note (Signed)
Encounter addended by: Dwana Melena, RN on: 11/27/2018 10:56 AM  Actions taken: Clinical Note Signed, Episode resolved

## 2018-11-27 NOTE — Telephone Encounter (Signed)
Spoke with patient. She is aware that the CT in 6 months will be without contrast. Order has been placed.   Nothing further needed at time of call.

## 2018-11-27 NOTE — Telephone Encounter (Signed)
Called and spoke with pt letting her know the results of the CT and also stated to her RB said he was comfortable with her starting back in pulmonary rehab as long as she was comfortable starting back. Pt verbalized understanding.  Pt wanted to know if the repeated CT in 6 months would be a CT without contrast or if it would be a CT with contrast as she stated last time the CT had to be changed to without contrast from a previous kidney problem.  Dr. Lamonte Sakai, please advise if you want pt to have CT with or CT without contrast to be done in 6 months? Thanks!

## 2018-11-30 ENCOUNTER — Encounter: Payer: Medicare Other | Admitting: Cardiovascular Disease

## 2018-12-01 ENCOUNTER — Encounter (HOSPITAL_COMMUNITY): Payer: Medicare Other

## 2018-12-03 ENCOUNTER — Encounter (HOSPITAL_COMMUNITY): Payer: Medicare Other

## 2018-12-08 ENCOUNTER — Encounter (HOSPITAL_COMMUNITY): Payer: Medicare Other

## 2018-12-10 ENCOUNTER — Encounter (HOSPITAL_COMMUNITY): Payer: Medicare Other

## 2018-12-15 ENCOUNTER — Encounter (HOSPITAL_COMMUNITY): Payer: Medicare Other

## 2018-12-15 ENCOUNTER — Other Ambulatory Visit: Payer: Self-pay

## 2018-12-15 DIAGNOSIS — E0590 Thyrotoxicosis, unspecified without thyrotoxic crisis or storm: Secondary | ICD-10-CM | POA: Diagnosis not present

## 2018-12-15 LAB — TSH: TSH: 0.962 u[IU]/mL (ref 0.450–4.500)

## 2018-12-15 LAB — T4, FREE: Free T4: 0.98 ng/dL (ref 0.82–1.77)

## 2018-12-16 ENCOUNTER — Ambulatory Visit (HOSPITAL_COMMUNITY): Payer: Medicare Other | Attending: Cardiology

## 2018-12-16 ENCOUNTER — Other Ambulatory Visit: Payer: Self-pay

## 2018-12-16 DIAGNOSIS — I2721 Secondary pulmonary arterial hypertension: Secondary | ICD-10-CM | POA: Diagnosis not present

## 2018-12-17 ENCOUNTER — Ambulatory Visit (INDEPENDENT_AMBULATORY_CARE_PROVIDER_SITE_OTHER): Payer: Medicare Other | Admitting: Internal Medicine

## 2018-12-17 ENCOUNTER — Encounter (HOSPITAL_COMMUNITY): Payer: Medicare Other

## 2018-12-17 ENCOUNTER — Encounter: Payer: Self-pay | Admitting: Internal Medicine

## 2018-12-17 VITALS — BP 100/60 | HR 69 | Temp 97.7°F | Ht 59.0 in | Wt 130.0 lb

## 2018-12-17 DIAGNOSIS — E118 Type 2 diabetes mellitus with unspecified complications: Secondary | ICD-10-CM | POA: Diagnosis not present

## 2018-12-17 DIAGNOSIS — I4891 Unspecified atrial fibrillation: Secondary | ICD-10-CM

## 2018-12-17 DIAGNOSIS — R911 Solitary pulmonary nodule: Secondary | ICD-10-CM

## 2018-12-17 DIAGNOSIS — I4819 Other persistent atrial fibrillation: Secondary | ICD-10-CM

## 2018-12-17 DIAGNOSIS — I5032 Chronic diastolic (congestive) heart failure: Secondary | ICD-10-CM

## 2018-12-17 DIAGNOSIS — I2721 Secondary pulmonary arterial hypertension: Secondary | ICD-10-CM

## 2018-12-17 DIAGNOSIS — D6869 Other thrombophilia: Secondary | ICD-10-CM | POA: Insufficient documentation

## 2018-12-17 DIAGNOSIS — Z950 Presence of cardiac pacemaker: Secondary | ICD-10-CM | POA: Diagnosis not present

## 2018-12-17 DIAGNOSIS — I2510 Atherosclerotic heart disease of native coronary artery without angina pectoris: Secondary | ICD-10-CM | POA: Diagnosis not present

## 2018-12-17 DIAGNOSIS — E0590 Thyrotoxicosis, unspecified without thyrotoxic crisis or storm: Secondary | ICD-10-CM

## 2018-12-17 DIAGNOSIS — E119 Type 2 diabetes mellitus without complications: Secondary | ICD-10-CM | POA: Insufficient documentation

## 2018-12-17 NOTE — Progress Notes (Signed)
Provider:  Rexene Edison. Mariea Clonts, D.O., C.M.D. Location:   [place]  Place of Service:   clinic  Previous PCP:  Dr. Jani Gravel  Patient Care Team: Gayland Curry, DO as PCP - General (Geriatric Medicine) Sanda Klein, MD as PCP - Cardiology (Cardiology) Anda Kraft, MD as Consulting Physician (Endocrinology) Hennie Duos, MD as Consulting Physician (Rheumatology) Collene Gobble, MD as Consulting Physician (Pulmonary Disease)  Extended Emergency Contact Information Primary Emergency Contact: Elsie Stain of [place] Phone: [phone] Relation: Daughter Secondary Emergency Contact: Hitomi, [place] Mobile Phone: [phone] Relation: Spouse  Goals of Care: Advanced Directive information Advanced Directives 07/20/2018  Does Patient Have a Medical Advance Directive? Yes  Type of Advance Directive [place]  Does patient want to make changes to medical advance directive? No - Patient declined  Copy of [place] in Chart? No - copy requested  Pre-existing out of facility DNR order (yellow form or pink MOST form) -   Chief Complaint  Patient presents with  . Establish Care    New Patient    HPI: Patient is a 73 y.o. female seen today to establish with [facility].  Records have been requested from Dr. Jani Gravel.  Her husband recently switched over care.  She has a complicated medical history including hyperthyroidism with toxic multinodular goiter (followed previously by Dr. Wilson Singer), gerd, afib s/p cardioversion, attempts with antiarrhythmics and ablation and most recently s/p pacemaker for sick sinus/tachy-brady syndrome, 2:1 av block, chronic diastolic chf (follows with Dr. Sallyanne Kuster), 2.4cm RLL pulmonary nodule--stable on CT 6/22 (next due in 6 mos through pulmonary), pulmonary hypertension (followed by Dr. Lamonte Sakai), and DMII among others.    She has come a long way since November.  She has learned to adjust and do  what she needs to do.  She saw Dr. Loletha Grayer virtually in June.  He said everything was going well and ordered some labs:  T4 and TSH--they were normal last Tuesday (methimazole 07/29/18).  She also had an echo Wednesday with Dr. Rayann Heman.  Dr. Theda Sers had changed her thyroid medication.  In June, she had stayed in afib since Nov.  She never knows if she's in afib.  She has slowed down a little--spaced out her cleaning some.  She doesn't do well taking naps.  Her children have really helped them with her husband being ill, as well.  Echo showed her pulmonary pressure had improved to 57mmHg.    Dr. Theda Sers' pharmacist also changed her sugar medicine off onglyza and they thought that put her into chf.  He changed her januvia on 10/24/18.  She's not had more labs since.  She's off iron since 6/5.  Dr. Maudie Mercury put her on oxybutynin b/c when she takes her fluid pill (lasix) she has bladder spasms.    She's not had a recent eye exam.  She had seen Dr. Marica Otter last year.  She did have cataracts noted, but they did not need surgery yet.    She did not have her ovaries removed during her hysterectomy.  Dr. Randol Kern is her gynecologist--[facility].    She's seen Dr. Amil Amen for an elevated immune test found by Dr. Lamonte Sakai.  She did not have lupus so she did not need to continue to follow with rheumatology.  She has a bone density in 2015 from Dr. Gertie Fey and mammogram 2018--usually done through [facility].  Pt thinks Dr. Maudie Mercury did another bone density after that.  Has living will and hcpoa and will bring with her next time.  Past Medical History:  Diagnosis Date  . Acute on chronic diastolic CHF (congestive heart failure), NYHA class 1 ([place]) 04/29/2013  . Allergic rhinitis   . Atrial fibrillation ([place])    Pacemaker  . Brady-tachy syndrome (HCC)    s/p STJ dual chamber PPM by Dr Sallyanne Kuster  . CAD (coronary artery disease)    70% RCA stenosis, treated medically  . Control of atrial fibrillation with  pacemaker ([place])   . Diabetes mellitus ([place])   . DJD (degenerative joint disease)   . Dysrhythmia    Paroxysmal Atrial Fib.  . Hypertension   . Hyperthyroidism   . Paroxysmal atrial fibrillation (HCC)   . Polio    as a child  . PONV (postoperative nausea and vomiting)   . Presence of permanent cardiac pacemaker   . Pulmonary disease    Past Surgical History:  Procedure Laterality Date  . arm surgery d/t fx Right 96  . ATRIAL FIBRILLATION ABLATION  07/31/2012; 08-10-13   PVI by Dr Rayann Heman  . ATRIAL FIBRILLATION ABLATION N/A 07/31/2012   Procedure: ATRIAL FIBRILLATION ABLATION;  Surgeon: Thompson Grayer, MD;  [facility] C[facility] LAB;  Service: Cardiovascular;  Laterality: N/A;  . ATRIAL FIBRILLATION ABLATION N/A 08/10/2013   Procedure: ATRIAL FIBRILLATION ABLATION;  Surgeon: Coralyn Mark, MD;  Location: [place] CATH LAB;  Service: Cardiovascular;  Laterality: N/A;  . BACK SURGERY  07/26/2012   dR. nUDELMAN  . CARDIAC CATHETERIZATION  12/03/2010   single vessel mid RCA  . CARDIAC ELECTROPHYSIOLOGY MAPPING AND ABLATION  07/31/2012   Dr. Thompson Grayer  . CESAREAN SECTION  09/20/1974   Dr. Gertie Fey  . CHOLECYSTECTOMY  1994  . COLONOSCOPY WITH PROPOFOL N/A 03/21/2015   Procedure: COLONOSCOPY WITH PROPOFOL;  Surgeon: Juanita Craver, MD;  Location: [facility] ENDOSCOPY;  Service: Endoscopy;  Laterality: N/A;  . DIAGNOSTIC MAMMOGRAM  04/16/2017   Hedwig Morton. Morris, DO  . LAPAROSCOPIC HYSTERECTOMY  Late 34s to early 80s  . LEFT HEART CATH AND CORONARY ANGIOGRAPHY N/A 07/30/2017   Procedure: LEFT HEART CATH AND CORONARY ANGIOGRAPHY;  Surgeon: Troy Sine, MD;  Location: [place] CV LAB;  Service: Cardiovascular;  Laterality: N/A;  . LUMBAR LAMINECTOMY/DECOMPRESSION MICRODISCECTOMY Right 12/23/2012   Procedure: Right L5-S1 Laminotomy for resection of synovial cyst;  Surgeon: Hosie Spangle, MD;  Location: [place] NEURO ORS;  Service: Neurosurgery;  Laterality: Right;  Right Lumbar five-sacral one Laminotomy for  resection of synovial cyst  . NM MYOCAR PERF WALL MOTION  12/20/2010   normal  . PACEMAKER INSERTION  12/03/10   SJM Accent DR RF implanted by Dr Sallyanne Kuster  . RIGHT HEART CATH N/A 04/22/2018   Procedure: RIGHT HEART CATH;  Surgeon: Wellington Hampshire, MD;  Location: [place] CV LAB;  Service: Cardiovascular;  Laterality: N/A;  . TEE WITHOUT CARDIOVERSION N/A 07/30/2012   Procedure: TRANSESOPHAGEAL ECHOCARDIOGRAM (TEE);  Surgeon: Sanda Klein, MD;  Location[facility] ENDOSCOPY;  Service: Cardiovascular;  Laterality: N/A;  h&p in file-[place]  . TEE WITHOUT CARDIOVERSION N/A 08/09/2013   Procedure: TRANSESOPHAGEAL ECHOCARDIOGRAM (TEE);  Surgeon: Lelon Perla, MD;  Location: [place];  Service: Cardiovascular;  Laterality: N/A;  . TOTAL ABDOMINAL HYSTERECTOMY  1981  . ULTRASOUND GUIDANCE FOR VASCULAR ACCESS  04/22/2018   Procedure: Ultrasound Guidance For Vascular Access;  Surgeon: Wellington Hampshire, MD;  Location: [place] CV LAB;  Service: Cardiovascular;;    Social History   Socioeconomic History  .  Marital status: Married    Spouse name: [name]  . Number of children: 3  . Years of education: 2  . Highest education level: Some college, no degree  Occupational History  . Occupation: housewife  Social Needs  . Financial resource strain: Not on file  . Food insecurity    Worry: Not on file    Inability: Not on file  . Transportation needs    Medical: Not on file    Non-medical: Not on file  Tobacco Use  . Smoking status: Never Smoker  . Smokeless tobacco: Never Used  Substance and Sexual Activity  . Alcohol use: No  . Drug use: No  . Sexual activity: Not Currently    Birth control/protection: Abstinence  Lifestyle  . Physical activity    Days per week: Not on file    Minutes per session: Not on file  . Stress: Not on file  Relationships  . Social Herbalist on phone: Not on file    Gets together: Not on file    Attends religious service: Not on file     Active member of club or organization: Not on file    Attends meetings of clubs or organizations: Not on file    Relationship status: Not on file  Other Topics Concern  . Not on file  Social History Narrative   Lives in [place].  Retired.    reports that she has never smoked. She has never used smokeless tobacco. She reports that she does not drink alcohol or use drugs.  Functional Status Survey:    Family History  Problem Relation Age of Onset  . Cancer Brother   . Heart attack Sister   . Breast cancer Sister   . Diabetes Sister   . Heart disease Sister   . Breast cancer Sister   . Dementia Sister   . Colon cancer Sister   . Heart disease Sister   . Breast cancer Sister   . Suicidality Brother   . Prostate cancer Brother   . Dementia Brother     Health Maintenance  Topic Date Due  . Hepatitis C Screening  06/21/1945  . URINE MICROALBUMIN  03/02/1955  . INFLUENZA VACCINE  01/02/2019  . PNA vac Low Risk Adult (2 of 2 - PCV13) 02/10/2019  . MAMMOGRAM  04/17/2019  . TETANUS/TDAP  03/06/2023  . COLONOSCOPY  03/20/2025  . DEXA SCAN  Completed    Allergies  Allergen Reactions  . Codeine Nausea And Vomiting and Nausea Only  . Ketorolac Nausea And Vomiting, Nausea Only and Other (See Comments)    "pt told to never take this med"  . Meperidine Nausea Only  . Demerol Nausea And Vomiting  . Dronedarone Other (See Comments)    Increased fluid retention  . Cyclobenzaprine Nausea And Vomiting    Outpatient Encounter Medications as of 12/17/2018  Medication Sig  . Cholecalciferol (VITAMIN D3) 1000 UNITS CAPS Take 1 capsule by mouth daily.   . Cyanocobalamin (VITAMIN B12 PO) Take 1 tablet by mouth daily as needed (for energy).   Marland Kitchen diltiazem (DILT-XR) 180 MG 24 hr capsule Take 180 mg by mouth daily.  . furosemide (LASIX) 80 MG tablet TAKE 1 TABLET BY MOUTH ONCE DAILY FOR 30 DAYS  . loperamide (IMODIUM) 2 MG capsule Take by mouth as needed for diarrhea or loose stools.  .  methimazole (TAPAZOLE) 10 MG tablet TAKE 1 TABLET BY MOUTH ONCE DAILY WITH FOOD FOR 30 DAYS  .  metoprolol tartrate (LOPRESSOR) 50 MG tablet Take 1 tablet (50 mg total) by mouth 2 (two) times daily.  Marland Kitchen oxybutynin (DITROPAN) 5 MG tablet Take 5 mg by mouth daily.   . pantoprazole (PROTONIX) 40 MG tablet Take 40 mg by mouth daily.  . potassium chloride SA (K-DUR) 20 MEQ tablet Take 1 tablet by mouth once daily  . PRADAXA 150 MG CAPS capsule Take 1 capsule by mouth twice daily  . sildenafil (REVATIO) 20 MG tablet TAKE 1 TABLET BY MOUTH THREE TIMES DAILY  . sitaGLIPtin (JANUVIA) 100 MG tablet Take 100 mg by mouth daily.  Marland Kitchen spironolactone (ALDACTONE) 25 MG tablet TAKE 1 TABLET BY MOUTH ONCE DAILY   No facility-administered encounter medications on file as of 12/17/2018.     Review of Systems  Constitutional: Positive for malaise/fatigue. Negative for chills and fever.  HENT: Negative for congestion and hearing loss.        Sinus problems  Eyes:       Cataracts, glasses  Respiratory: Negative for cough and shortness of breath.   Cardiovascular: Positive for orthopnea. Negative for chest pain, palpitations, claudication, leg swelling and PND.       Cannot lie flat since 11/19; chf, afib  Gastrointestinal: Positive for diarrhea and nausea. Negative for abdominal pain, blood in stool, constipation and melena.       Occasionally  Genitourinary: Negative for dysuria.       Incontinence and bladder spasms related to diuretic use  Musculoskeletal: Negative for falls and joint pain.       Back pain, "osteoporosis" selected on new patient packet  Skin: Negative for itching and rash.  Neurological: Negative for dizziness and loss of consciousness.  Endo/Heme/Allergies: Bruises/bleeds easily.       Thyroid disease, diabetes  Psychiatric/Behavioral: Negative for depression and memory loss. The patient is not nervous/anxious and does not have insomnia.        Is primary caregiver for her husband who has  dementia    Vitals:   12/17/18 1326  BP: 100/60  Pulse: 69  Temp: 97.7 F (36.5 C)  TempSrc: Oral  SpO2: 98%  Weight: 130 lb (59 kg)  Height: 4\' 11"  (1.499 m)   Body mass index is 26.26 kg/m. Physical Exam Vitals signs reviewed.  Constitutional:      General: She is not in acute distress.    Appearance: Normal appearance. She is normal weight. She is not ill-appearing or toxic-appearing.  HENT:     Head: Normocephalic and atraumatic.     Right Ear: Tympanic membrane, ear canal and external ear normal.     Left Ear: Tympanic membrane, ear canal and external ear normal.     Nose: Nose normal.     Mouth/Throat:     Comments: Deferred with covid masking Eyes:     Extraocular Movements: Extraocular movements intact.     Conjunctiva/sclera: Conjunctivae normal.     Pupils: Pupils are equal, round, and reactive to light.     Comments: glasses  Neck:     Musculoskeletal: Neck supple. No muscular tenderness.  Cardiovascular:     Heart sounds: Normal heart sounds. No murmur.     Comments: A bit tachy, irregular Pulmonary:     Effort: Pulmonary effort is normal.     Breath sounds: Normal breath sounds.  Abdominal:     General: Bowel sounds are normal. There is no distension.     Palpations: Abdomen is soft. There is no mass.     Tenderness:  There is no abdominal tenderness. There is no guarding or rebound.  Musculoskeletal: Normal range of motion.        General: No swelling or tenderness.     Right lower leg: No edema.     Left lower leg: No edema.     Comments: Some limited flexion of right knee that prevents her from stooping down since surgery  Lymphadenopathy:     Cervical: No cervical adenopathy.  Skin:    General: Skin is warm and dry.     Capillary Refill: Capillary refill takes less than 2 seconds.  Neurological:     General: No focal deficit present.     Mental Status: She is alert and oriented to person, place, and time. Mental status is at baseline.      Cranial Nerves: No cranial nerve deficit.     Sensory: No sensory deficit.     Motor: No weakness.     Coordination: Coordination normal.     Gait: Gait normal.     Deep Tendon Reflexes: Reflexes normal.  Psychiatric:        Mood and Affect: Mood normal.        Behavior: Behavior normal.        Thought Content: Thought content normal.        Judgment: Judgment normal.     Labs reviewed: Basic Metabolic Panel: Recent Labs    04/24/18 0356 04/25/18 0449 11/04/18 1057  NA 139 138 138  K 3.9 3.4* 4.3  CL 104 100 96  CO2 29 29 22   GLUCOSE 103* 108* 124*  BUN 12 11 16   CREATININE 0.89 1.05* 1.33*  CALCIUM 9.0 8.7* 9.7   Liver Function Tests: Recent Labs    04/19/18 1140  AST 22  ALT 22  ALKPHOS 81  BILITOT 1.1  PROT 6.7  ALBUMIN 3.9   No results for input(s): LIPASE, AMYLASE in the last 8760 hours. No results for input(s): AMMONIA in the last 8760 hours. CBC: Recent Labs    04/19/18 1140  04/24/18 0356 04/25/18 0449 11/04/18 1128  WBC 7.2   < > 4.8 5.3 7.7  NEUTROABS 5.7  --   --  3.3 5.8  HGB 10.1*   < > 9.8* 9.9* 14.7  HCT 35.1*   < > 33.9* 34.2* 46.1  MCV 80.0   < > 79.2* 78.3* 91  PLT 286   < > 269 300 354   < > = values in this interval not displayed.   Cardiac Enzymes: Recent Labs    04/19/18 1758 04/19/18 2316 04/20/18 0443  TROPONINI <0.03 <0.03 <0.03   BNP: Invalid input(s): POCBNP Lab Results  Component Value Date   HGBA1C 6.4 (H) 11/24/2010   Lab Results  Component Value Date   TSH 0.962 12/15/2018   Lab Results  Component Value Date   VITAMINB12 385 04/25/2018   Lab Results  Component Value Date   FOLATE 16.9 04/25/2018   Lab Results  Component Value Date   IRON 102 11/04/2018   TIBC 316 11/04/2018   FERRITIN 84 11/04/2018    Imaging and Procedures noted on new patient packet: See above in hpi  Assessment/Plan 1. Controlled type 2 diabetes mellitus with complication, without long-term current use of insulin ([place]) -  has historically been under good control with onglyza; it was recently changed to [place] in the past few months and she's not had a recheck of her hba1c though most other labs have gotten done by specialists - Hemoglobin  A1c -cont daily cbgs  2. Chronic diastolic heart failure (HCC) -continue daily weights and bps; doing much better since 11/19 hospitalization -cont aldactone, lasix and potassium supplement -sounds like she has chronic orthopnea  3. Other persistent atrial fibrillation -rate controlled with lopressor, diltiazem -s/p dual-chamber pacemaker with Dr. Rayann Heman  4. Coronary artery disease involving native coronary artery of native heart without angina pectoris -cont bp, lipid and glucose control to prevent MI  5. Secondary pulmonary arterial hypertension (HCC) -improved pulmonary pressures on last echo, followed closely by Dr. Sallyanne Kuster  6. Hyperthyroidism -was changed a few months ago from PTU to methimazole  -TSH and free T4 wnl when checked by cardiology  7. Pulmonary nodule -is for f/u CT chest in December 2020  8. Pacemaker [employer] accent DR RF , July 2012 -doing well here, pacemaker interrogated and monitored by cardiology  9. Hypercoagulable state due to atrial fibrillation (HCC) -is on chronic pradaxa anticoagulation for stroke prevention  Labs/tests ordered:   Lab Orders     Hemoglobin A1c  F/u with me in about 5 months (will arrange when she comes next month with her husband so they can be seen same day next time)  Binh Doten L. Phallon Haydu, D.O. [facility] [address], [place] [address] Cell Phone (Mon-Fri 8am-5pm):  [phone] On Call:  [phone] & follow prompts after 5pm & weekends Office Phone:  [phone] Office Fax:  [phone]

## 2018-12-17 NOTE — Patient Instructions (Addendum)
It was great getting to know you today!  Please bring [place] a copy of your living will and health care power of attorney and Phillip's when you come next month with him.  I will plan to see both you and Doren Custard together in about 5 months from now and we'll recheck your labs before that visit.

## 2018-12-18 LAB — HEMOGLOBIN A1C
Hgb A1c MFr Bld: 6.1 % of total Hgb — ABNORMAL HIGH (ref <5.7)
Mean Plasma Glucose: 128 (calc)
eAG (mmol/L): 7.1 (calc)

## 2018-12-21 ENCOUNTER — Other Ambulatory Visit: Payer: Self-pay | Admitting: Cardiovascular Disease

## 2018-12-28 ENCOUNTER — Other Ambulatory Visit: Payer: Self-pay | Admitting: Cardiovascular Disease

## 2018-12-29 ENCOUNTER — Other Ambulatory Visit: Payer: Self-pay | Admitting: *Deleted

## 2018-12-29 MED ORDER — PANTOPRAZOLE SODIUM 40 MG PO TBEC: 40.0000 mg | DELAYED_RELEASE_TABLET | Freq: Every day | ORAL | 1 refills | Status: DC

## 2018-12-29 NOTE — Telephone Encounter (Signed)
[facility]

## 2019-01-11 ENCOUNTER — Ambulatory Visit (INDEPENDENT_AMBULATORY_CARE_PROVIDER_SITE_OTHER): Payer: Medicare Other | Admitting: *Deleted

## 2019-01-11 DIAGNOSIS — I495 Sick sinus syndrome: Secondary | ICD-10-CM | POA: Diagnosis not present

## 2019-01-12 ENCOUNTER — Telehealth: Payer: Self-pay

## 2019-01-12 LAB — CUP PACEART REMOTE DEVICE CHECK
Battery Remaining Longevity: 3 mo
Battery Remaining Percentage: 3 %
Battery Voltage: 2.65 V
Brady Statistic AP VP Percent: 23 %
Brady Statistic AP VS Percent: 77 %
Brady Statistic AS VP Percent: 1 %
Brady Statistic AS VS Percent: 1 %
Brady Statistic RA Percent Paced: 52 %
Brady Statistic RV Percent Paced: 61 %
Date Time Interrogation Session: 20200810060013
Implantable Lead Implant Date: 20120702
Implantable Lead Implant Date: 20120702
Implantable Lead Location: 753859
Implantable Lead Location: 753860
Implantable Pulse Generator Implant Date: 20120702
Lead Channel Impedance Value: 300 Ohm
Lead Channel Impedance Value: 430 Ohm
Lead Channel Pacing Threshold Amplitude: 0.625 V
Lead Channel Pacing Threshold Amplitude: 0.75 V
Lead Channel Pacing Threshold Pulse Width: 0.4 ms
Lead Channel Pacing Threshold Pulse Width: 0.8 ms
Lead Channel Sensing Intrinsic Amplitude: 2.7 mV
Lead Channel Sensing Intrinsic Amplitude: 4.6 mV
Lead Channel Setting Pacing Amplitude: 1.625
Lead Channel Setting Pacing Amplitude: 2.5 V
Lead Channel Setting Pacing Pulse Width: 0.8 ms
Lead Channel Setting Sensing Sensitivity: 2 mV
Pulse Gen Model: 2210
Pulse Gen Serial Number: 7255453

## 2019-01-12 NOTE — Telephone Encounter (Signed)
Explained to pt that there are no restrictions when a device is nearing ERI. All questions answered.

## 2019-01-12 NOTE — Telephone Encounter (Signed)
Patient has a few more questions, she wants to make sure she can still drive, she wants to know if she needs to take any extra precautions.

## 2019-01-12 NOTE — Telephone Encounter (Signed)
Spoke to pt regarding ERI alert, notified her that we will start monthly battery checks. All questions answered.

## 2019-01-13 ENCOUNTER — Other Ambulatory Visit: Payer: Self-pay | Admitting: *Deleted

## 2019-01-13 MED ORDER — SPIRONOLACTONE 25 MG PO TABS: 25.0000 mg | ORAL_TABLET | Freq: Every day | ORAL | 1 refills | Status: DC

## 2019-01-13 NOTE — Telephone Encounter (Signed)
[facility] sent Request  Pended Rx and sent to Dr. Mariea Clonts for approval due to [place].

## 2019-01-19 ENCOUNTER — Encounter: Payer: Self-pay | Admitting: Cardiology

## 2019-01-19 NOTE — Progress Notes (Signed)
Remote pacemaker transmission.   

## 2019-01-21 ENCOUNTER — Other Ambulatory Visit: Payer: Self-pay

## 2019-01-21 ENCOUNTER — Ambulatory Visit (INDEPENDENT_AMBULATORY_CARE_PROVIDER_SITE_OTHER): Payer: Medicare Other | Admitting: *Deleted

## 2019-01-21 DIAGNOSIS — Z23 Encounter for immunization: Secondary | ICD-10-CM

## 2019-01-29 ENCOUNTER — Other Ambulatory Visit: Payer: Self-pay | Admitting: *Deleted

## 2019-01-29 MED ORDER — METHIMAZOLE 10 MG PO TABS: ORAL_TABLET | ORAL | 0 refills | Status: DC

## 2019-01-29 NOTE — Telephone Encounter (Signed)
Patient takes medication daily and doing well. Request Refill.

## 2019-02-11 ENCOUNTER — Encounter: Payer: Medicare Other | Admitting: *Deleted

## 2019-02-26 ENCOUNTER — Ambulatory Visit (INDEPENDENT_AMBULATORY_CARE_PROVIDER_SITE_OTHER): Payer: Medicare Other | Admitting: *Deleted

## 2019-02-26 ENCOUNTER — Telehealth: Payer: Self-pay

## 2019-02-26 DIAGNOSIS — I495 Sick sinus syndrome: Secondary | ICD-10-CM

## 2019-02-26 LAB — CUP PACEART REMOTE DEVICE CHECK
Battery Remaining Longevity: 1 mo
Battery Remaining Percentage: 0.5 %
Battery Voltage: 2.62 V
Brady Statistic AP VP Percent: 23 %
Brady Statistic AP VS Percent: 77 %
Brady Statistic AS VP Percent: 1 %
Brady Statistic AS VS Percent: 1 %
Brady Statistic RA Percent Paced: 50 %
Brady Statistic RV Percent Paced: 64 %
Date Time Interrogation Session: 20200925141827
Implantable Lead Implant Date: 20120702
Implantable Lead Implant Date: 20120702
Implantable Lead Location: 753859
Implantable Lead Location: 753860
Implantable Pulse Generator Implant Date: 20120702
Lead Channel Impedance Value: 310 Ohm
Lead Channel Impedance Value: 430 Ohm
Lead Channel Pacing Threshold Amplitude: 0.625 V
Lead Channel Pacing Threshold Amplitude: 0.75 V
Lead Channel Pacing Threshold Pulse Width: 0.4 ms
Lead Channel Pacing Threshold Pulse Width: 0.8 ms
Lead Channel Sensing Intrinsic Amplitude: 2.7 mV
Lead Channel Sensing Intrinsic Amplitude: 4.6 mV
Lead Channel Setting Pacing Amplitude: 1.625
Lead Channel Setting Pacing Amplitude: 2.5 V
Lead Channel Setting Pacing Pulse Width: 0.8 ms
Lead Channel Setting Sensing Sensitivity: 2 mV
Pulse Gen Model: 2210
Pulse Gen Serial Number: 7255453

## 2019-02-26 NOTE — Telephone Encounter (Signed)
Confirmed transmission received . Patient is still having AT/AF episodes . Known issue by DR Croituro. No new symptoms or complaints. Does not report feeling episodes but feels tired with activity at her baseline level. Will cal if she develops SOB, increased edema or CP.

## 2019-02-26 NOTE — Telephone Encounter (Signed)
Pt called to get help sending a manual transmission. The pt monitor is hooked up to a land line. I told her the instructions and told her I will call her back as soon as I see the transmission come through.

## 2019-02-26 NOTE — Telephone Encounter (Signed)
Pt transmission was received. She will like for the nurse to call her with the results of the transmission today. I told her I will let the nurse know. The pt thanked me for the call.

## 2019-03-03 ENCOUNTER — Encounter: Payer: Self-pay | Admitting: Cardiology

## 2019-03-03 NOTE — Progress Notes (Signed)
Remote pacemaker transmission.   

## 2019-03-09 ENCOUNTER — Telehealth: Payer: Self-pay

## 2019-03-09 NOTE — Telephone Encounter (Signed)
   Primary Cardiologist: Sanda Klein, MD  Chart reviewed as part of pre-operative protocol coverage. Simple dental extractions are considered low risk procedures per guidelines and generally do not require any specific cardiac clearance. It is also generally accepted that for simple extractions and dental cleanings, there is no need to interrupt blood thinner therapy.   SBE prophylaxis is not required for the patient.  I will route this recommendation to the requesting party via Epic fax function and remove from pre-op pool.  Please call with questions.  Kathyrn Drown, NP 03/09/2019, 10:27 AM

## 2019-03-09 NOTE — Telephone Encounter (Signed)
Request for surgical clearance:  1. What type of surgery is being performed? EXTRACTION OF TOOTH #7   2. When is this surgery scheduled? TBD   3. What type of clearance is required (medical clearance vs. Pharmacy clearance to hold med vs. Both)?  MEDICAL  4. Are there any medications that need to be held prior to surgery and how long?NONE LISTED   5. Practice name and name of physician performing surgery? [facility] Helene Kelp [facility] ATTN: JENNY   6. What is your office phone number  [phone]    7.   What is your office fax number  [phone]  8.   Anesthesia type (None, local, MAC, general) ? LOCAL WITH EPI & NITROUS OXIDE

## 2019-03-16 ENCOUNTER — Other Ambulatory Visit: Payer: Self-pay | Admitting: Cardiovascular Disease

## 2019-03-18 ENCOUNTER — Telehealth: Payer: Self-pay

## 2019-03-18 NOTE — Telephone Encounter (Signed)
Pt takes Pradaxa for afib with CHADS2VASc score of 6 (age, sex, CHF, HTN, DM, CAD). SCr is 1.33, CrCl is 34.56mL/min. Typically do not hold anticoagulation for single dental extraction, however we received this clearance last week with the recommendation to continue Pradaxa, request is now to hold for 2 days prior for extraction since bone removal is required. Pt is at elevated cardiac risk however has reduced renal clearance of Pradaxa as well. 2 day hold is ok prior to procedure.

## 2019-03-18 NOTE — Telephone Encounter (Signed)
   Primary Cardiologist: Sanda Klein, MD  Chart reviewed as part of pre-operative protocol coverage. Simple dental extractions are considered low risk procedures per guidelines and generally do not require any specific cardiac clearance. It is also generally accepted that for simple extractions and dental cleanings, there is no need to interrupt blood thinner therapy.   SBE prophylaxis is not required for the patient.  The patient's chart has been reviewed by the clinical pharmacist. OK to hold the Pradaxa for 2 days prior to procedure.  I will route this recommendation to the requesting party via Epic fax function and remove from pre-op pool.  Please call with questions.  Rosaria Ferries, PA-C 03/18/2019, 12:45 PM   5. [facility] ATTN: JENNY  6. What is your office phone [phone]  7. What is your office fax number [phone]

## 2019-03-18 NOTE — Telephone Encounter (Signed)
Request for surgical clearance:  1. What type of surgery is being performed? EXTRACTION OF TOOTH #17  WILL REQUIRE BONE REMOVAL  2. When is this surgery scheduled? TBD   3. What type of clearance is required (medical clearance vs. Pharmacy clearance to hold med vs. Both)?  BOTH  4. Are there any medications that need to be held prior to surgery and how long? PRADAXA  2 DAYS PRIOR  5. Practice name and name of physician performing surgery? [facility] DR Helene Kelp [employer] ATTN: JENNY   6. What is your office phone number  [phone]    7.   What is your office fax number  [phone]  8.   Anesthesia type (None, local, MAC, general) ? LOCAL WITH EPI & NITROUS OXIDE

## 2019-03-26 ENCOUNTER — Ambulatory Visit (INDEPENDENT_AMBULATORY_CARE_PROVIDER_SITE_OTHER): Payer: Medicare Other | Admitting: *Deleted

## 2019-03-26 DIAGNOSIS — I4891 Unspecified atrial fibrillation: Secondary | ICD-10-CM

## 2019-03-26 DIAGNOSIS — I441 Atrioventricular block, second degree: Secondary | ICD-10-CM

## 2019-03-28 LAB — CUP PACEART REMOTE DEVICE CHECK
Battery Remaining Longevity: 1 mo
Battery Remaining Percentage: 0.5 %
Battery Voltage: 2.6 V
Brady Statistic AP VP Percent: 23 %
Brady Statistic AP VS Percent: 77 %
Brady Statistic AS VP Percent: 1 %
Brady Statistic AS VS Percent: 1 %
Brady Statistic RA Percent Paced: 49 %
Brady Statistic RV Percent Paced: 65 %
Date Time Interrogation Session: 20201023073949
Implantable Lead Implant Date: 20120702
Implantable Lead Implant Date: 20120702
Implantable Lead Location: 753859
Implantable Lead Location: 753860
Implantable Pulse Generator Implant Date: 20120702
Lead Channel Impedance Value: 310 Ohm
Lead Channel Impedance Value: 460 Ohm
Lead Channel Pacing Threshold Amplitude: 0.625 V
Lead Channel Pacing Threshold Amplitude: 0.75 V
Lead Channel Pacing Threshold Pulse Width: 0.4 ms
Lead Channel Pacing Threshold Pulse Width: 0.8 ms
Lead Channel Sensing Intrinsic Amplitude: 2.7 mV
Lead Channel Sensing Intrinsic Amplitude: 4.6 mV
Lead Channel Setting Pacing Amplitude: 1.625
Lead Channel Setting Pacing Amplitude: 2.5 V
Lead Channel Setting Pacing Pulse Width: 0.8 ms
Lead Channel Setting Sensing Sensitivity: 2 mV
Pulse Gen Model: 2210
Pulse Gen Serial Number: 7255453

## 2019-04-05 NOTE — Addendum Note (Signed)
Addended by: Douglass Rivers D on: 04/05/2019 02:27 PM   Modules accepted: Level of Service

## 2019-04-05 NOTE — Progress Notes (Signed)
Remote pacemaker transmission.   

## 2019-04-07 ENCOUNTER — Other Ambulatory Visit: Payer: Self-pay | Admitting: Cardiovascular Disease

## 2019-04-07 DIAGNOSIS — I272 Pulmonary hypertension, unspecified: Secondary | ICD-10-CM

## 2019-04-19 ENCOUNTER — Other Ambulatory Visit: Payer: Self-pay | Admitting: Internal Medicine

## 2019-04-23 ENCOUNTER — Ambulatory Visit (INDEPENDENT_AMBULATORY_CARE_PROVIDER_SITE_OTHER): Payer: Medicare Other | Admitting: Family

## 2019-04-23 ENCOUNTER — Encounter: Payer: Self-pay | Admitting: Family

## 2019-04-23 ENCOUNTER — Other Ambulatory Visit: Payer: Self-pay

## 2019-04-23 ENCOUNTER — Telehealth: Payer: Self-pay | Admitting: Internal Medicine

## 2019-04-23 VITALS — BP 104/65 | HR 95 | Wt 130.6 lb

## 2019-04-23 DIAGNOSIS — Z20828 Contact with and (suspected) exposure to other viral communicable diseases: Secondary | ICD-10-CM

## 2019-04-23 DIAGNOSIS — Z20822 Contact with and (suspected) exposure to covid-19: Secondary | ICD-10-CM

## 2019-04-23 MED ORDER — ZINC GLUCONATE 50 MG PO TABS: 50.0000 mg | ORAL_TABLET | Freq: Every day | ORAL | 0 refills | Status: AC

## 2019-04-23 MED ORDER — VITAMIN C 500 MG PO TABS: 500.0000 mg | ORAL_TABLET | Freq: Every day | ORAL | 0 refills | Status: AC

## 2019-04-23 NOTE — Progress Notes (Signed)
This service is provided via telemedicine  No vital signs collected/recorded due to the encounter was a telemedicine visit.   Location of patient (ex: home, work):  Home   Patient consents to a telephone visit: yes  Location of the provider (ex: office, home):  office  Name of any referring provider: Hollace Kinnier, [place] of all persons participating in the telemedicine service and their role in the encounter: Marlowe Sax, NP, Ruthell Rummage CMA, [place]   Time spent on call:  Ruthell Rummage CMA spent 9 minutes on phone with patient.    Provider: Dinah Ngetich FNP-C  Gayland Curry, DO  Patient Care Team: Gayland Curry, DO as PCP - General (Geriatric Medicine) Sanda Klein, MD as PCP - Cardiology (Cardiology) Collene Gobble, MD as Consulting Physician (Pulmonary Disease) Marica Otter, [place] (Optometry) Linda Hedges, DO as Consulting Physician (Obstetrics and Gynecology)  Extended Emergency Contact Information Primary Emergency Contact: Elsie Stain of [place] Phone: [phone] Relation: Daughter Secondary Emergency Contact: Deniece, [place] Mobile Phone: [phone] Relation: Spouse  Code Status: Full Code  Goals of care: Advanced Directive information Advanced Directives 07/20/2018  Does Patient Have a Medical Advance Directive? Yes  Type of Advance Directive [place]  Does patient want to make changes to medical advance directive? No - Patient declined  Copy of [place] in Chart? No - copy requested  Pre-existing out of facility DNR order (yellow form or pink MOST form) -     Chief Complaint  Patient presents with  . Acute Visit    Patient states she had positive exposure to COVID patient denies any symptoms     HPI:  Pt is a 73 y.o. female seen today for an acute visit for evaluation of exposure to COVID-19.she states went to her hairdresser in [place] on 04/14/2019.she was  notified that her hairdresser Husband test positive for COViD-19 on 04/20/2019.she was wearing her mask when her hair was being washed.Hair dresser also had a mask and a face shield.she denies any fever,chills,cough,loss of smell,taste,chest pain,wheezing or shortness of breath.she would like to be tested due high risk group due to her age and her medical co-mobidities.      Past Medical History:  Diagnosis Date  . Acute on chronic diastolic CHF (congestive heart failure), NYHA class 1 ([place]) 04/29/2013  . Allergic rhinitis   . Atrial fibrillation ([place])    Pacemaker  . Brady-tachy syndrome (HCC)    s/p STJ dual chamber PPM by Dr Sallyanne Kuster  . CAD (coronary artery disease)    70% RCA stenosis, treated medically  . Control of atrial fibrillation with pacemaker ([place])   . Diabetes mellitus ([place])   . DJD (degenerative joint disease)   . Dysrhythmia    Paroxysmal Atrial Fib.  . Hypertension   . Hyperthyroidism   . Paroxysmal atrial fibrillation (HCC)   . Polio    as a child  . PONV (postoperative nausea and vomiting)   . Presence of permanent cardiac pacemaker   . Pulmonary disease    Past Surgical History:  Procedure Laterality Date  . arm surgery d/t fx Right 96  . ATRIAL FIBRILLATION ABLATION  07/31/2012; 08-10-13   PVI by Dr Rayann Heman  . ATRIAL FIBRILLATION ABLATION N/A 07/31/2012   Procedure: ATRIAL FIBRILLATION ABLATION;  Surgeon: Thompson Grayer, MD;  [facility] LAB;  Service: Cardiovascular;  Laterality: N/A;  . ATRIAL FIBRILLATION ABLATION N/A 08/10/2013   Procedure: ATRIAL FIBRILLATION  ABLATION;  Surgeon: Coralyn Mark, MD;  Location: [facility] CATH LAB;  Service: Cardiovascular;  Laterality: N/A;  . BACK SURGERY  07/26/2012   dR. nUDELMAN  . CARDIAC CATHETERIZATION  12/03/2010   single vessel mid RCA  . CARDIAC ELECTROPHYSIOLOGY MAPPING AND ABLATION  07/31/2012   Dr. Thompson Grayer  . CESAREAN SECTION  09/20/1974   Dr. Gertie Fey  . CHOLECYSTECTOMY  1994  . COLONOSCOPY WITH PROPOFOL N/A  03/21/2015   Procedure: COLONOSCOPY WITH PROPOFOL;  Surgeon: Juanita Craver, MD;  Location: [facility] ENDOSCOPY;  Service: Endoscopy;  Laterality: N/A;  . DIAGNOSTIC MAMMOGRAM  04/16/2017   Hedwig Morton. Morris, DO  . LAPAROSCOPIC HYSTERECTOMY  Late 36s to early 80s  . LEFT HEART CATH AND CORONARY ANGIOGRAPHY N/A 07/30/2017   Procedure: LEFT HEART CATH AND CORONARY ANGIOGRAPHY;  Surgeon: Troy Sine, MD;  Location: [place] CV LAB;  Service: Cardiovascular;  Laterality: N/A;  . LUMBAR LAMINECTOMY/DECOMPRESSION MICRODISCECTOMY Right 12/23/2012   Procedure: Right L5-S1 Laminotomy for resection of synovial cyst;  Surgeon: Hosie Spangle, MD;  Location: [place] NEURO ORS;  Service: Neurosurgery;  Laterality: Right;  Right Lumbar five-sacral one Laminotomy for resection of synovial cyst  . NM MYOCAR PERF WALL MOTION  12/20/2010   normal  . PACEMAKER INSERTION  12/03/10   SJM Accent DR RF implanted by Dr Sallyanne Kuster  . RIGHT HEART CATH N/A 04/22/2018   Procedure: RIGHT HEART CATH;  Surgeon: Wellington Hampshire, MD;  Location: [place] CV LAB;  Service: Cardiovascular;  Laterality: N/A;  . TEE WITHOUT CARDIOVERSION N/A 07/30/2012   Procedure: TRANSESOPHAGEAL ECHOCARDIOGRAM (TEE);  Surgeon: Sanda Klein, MD;  Location: [facility] ENDOSCOPY;  Service: Cardiovascular;  Laterality: N/A;  h&p in file-[place]  . TEE WITHOUT CARDIOVERSION N/A 08/09/2013   Procedure: TRANSESOPHAGEAL ECHOCARDIOGRAM (TEE);  Surgeon: Lelon Perla, MD;  Location: [place];  Service: Cardiovascular;  Laterality: N/A;  . TOTAL ABDOMINAL HYSTERECTOMY  1981  . ULTRASOUND GUIDANCE FOR VASCULAR ACCESS  04/22/2018   Procedure: Ultrasound Guidance For Vascular Access;  Surgeon: Wellington Hampshire, MD;  Location: [place] CV LAB;  Service: Cardiovascular;;    Allergies  Allergen Reactions  . Codeine Nausea And Vomiting and Nausea Only  . Ketorolac Nausea And Vomiting, Nausea Only and Other (See Comments)    "pt told to never take this med"  .  Meperidine Nausea Only  . Demerol Nausea And Vomiting  . Dronedarone Other (See Comments)    Increased fluid retention  . Cyclobenzaprine Nausea And Vomiting    Outpatient Encounter Medications as of 04/23/2019  Medication Sig  . Cholecalciferol (VITAMIN D3) 1000 UNITS CAPS Take 1 capsule by mouth daily.   . Cyanocobalamin (VITAMIN B12 PO) Take 1 tablet by mouth daily as needed (for energy).   Marland Kitchen DILT-XR 180 MG 24 hr capsule Take 1 capsule by mouth once daily  . furosemide (LASIX) 80 MG tablet Take 1 tablet by mouth once daily  . loperamide (IMODIUM) 2 MG capsule Take by mouth as needed for diarrhea or loose stools.  . methimazole (TAPAZOLE) 10 MG tablet TAKE 1 TABLET BY MOUTH ONCE DAILY WITH FOOD FOR THYROID  . metoprolol tartrate (LOPRESSOR) 50 MG tablet Take 1 tablet (50 mg total) by mouth 2 (two) times daily.  Marland Kitchen oxybutynin (DITROPAN) 5 MG tablet Take 5 mg by mouth daily.   . pantoprazole (PROTONIX) 40 MG tablet Take 1 tablet (40 mg total) by mouth daily.  . potassium chloride SA (KLOR-CON) 20 MEQ tablet Take 1 tablet  by mouth once daily  . PRADAXA 150 MG CAPS capsule Take 1 capsule by mouth twice daily  . sildenafil (REVATIO) 20 MG tablet TAKE 1 TABLET BY MOUTH THREE TIMES DAILY  . sitaGLIPtin (JANUVIA) 100 MG tablet Take 100 mg by mouth daily.  Marland Kitchen spironolactone (ALDACTONE) 25 MG tablet Take 1 tablet (25 mg total) by mouth daily.  . vitamin C (ASCORBIC ACID) 500 MG tablet Take 1 tablet (500 mg total) by mouth daily.  Marland Kitchen zinc gluconate 50 MG tablet Take 1 tablet (50 mg total) by mouth daily for 14 days.   No facility-administered encounter medications on file as of 04/23/2019.     Review of Systems  Constitutional: Negative for appetite change, chills, fatigue and fever.  HENT: Negative for congestion, rhinorrhea, sinus pressure, sinus pain, sneezing, sore throat and trouble swallowing.   Respiratory: Negative for cough, chest tightness, shortness of breath and wheezing.    Cardiovascular: Negative for chest pain, palpitations and leg swelling.  Gastrointestinal: Negative for abdominal distention, abdominal pain, constipation, diarrhea, nausea and vomiting.  Musculoskeletal: Negative for myalgias.  Skin: Negative for color change, pallor and rash.  Neurological: Negative for dizziness, weakness, light-headedness and headaches.  Psychiatric/Behavioral: Negative for agitation, confusion and sleep disturbance. The patient is not nervous/anxious.     Immunization History  Administered Date(s) Administered  . Fluad Quad(high Dose 65+) 01/21/2019  . Influenza Whole 02/19/2013  . Influenza, High Dose Seasonal PF 02/09/2018  . Pneumococcal Polysaccharide-23 02/09/2018  . Pneumococcal-Unspecified 04/29/2011  . Tdap 03/05/2013   Pertinent  Health Maintenance Due  Topic Date Due  . FOOT EXAM  03/02/1956  . OPHTHALMOLOGY EXAM  03/02/1956  . URINE MICROALBUMIN  03/02/1956  . PNA vac Low Risk Adult (2 of 2 - PCV13) 02/10/2019  . MAMMOGRAM  04/17/2019  . HEMOGLOBIN A1C  06/19/2019  . COLONOSCOPY  03/20/2025  . INFLUENZA VACCINE  Completed  . DEXA SCAN  Completed   Fall Risk  04/23/2019 12/17/2018 07/20/2018 01/30/2016  Falls in the past year? 0 0 0 No  Comment - - - [place] Telephone Survey: data to providers prior to load  Number falls in past yr: 0 0 - -  Injury with Fall? 0 0 - -    Vitals:   04/23/19 1030  BP: 104/65  Pulse: 95  Weight: 130 lb 9.6 oz (59.2 kg)   Body mass index is 26.38 kg/m. Physical Exam Unable to complete on telephone visit.   Labs reviewed: Recent Labs    04/24/18 0356 04/25/18 0449 11/04/18 1057  NA 139 138 138  K 3.9 3.4* 4.3  CL 104 100 96  CO2 29 29 22   GLUCOSE 103* 108* 124*  BUN 12 11 16   CREATININE 0.89 1.05* 1.33*  CALCIUM 9.0 8.7* 9.7   No results for input(s): AST, ALT, ALKPHOS, BILITOT, PROT, ALBUMIN in the last 8760 hours. Recent Labs    04/24/18 0356 04/25/18 0449 11/04/18 1128  WBC 4.8 5.3 7.7   NEUTROABS  --  3.3 5.8  HGB 9.8* 9.9* 14.7  HCT 33.9* 34.2* 46.1  MCV 79.2* 78.3* 91  PLT 269 300 354   Lab Results  Component Value Date   TSH 0.962 12/15/2018   Lab Results  Component Value Date   HGBA1C 6.1 (H) 12/17/2018   Lab Results  Component Value Date   CHOL 94 11/24/2010   HDL 53 11/24/2010   LDLCALC 31 11/24/2010   TRIG 51 11/24/2010   CHOLHDL 1.8 11/24/2010    Significant  Diagnostic Results in last 30 days:  No results found.  Assessment/Plan  Exposure to COVID-19 virus Afebrile.Exposure to [facility] 04/14/2019 whose husband tested positive for COVID-19 04/20/2019.discussed with patient to continue wearing facial mask.social distance ,hand hygiene and self quarantine x 14 days until asymptomatic x 72 hours. Will send for testing at [address] in [place].[facility].  - zinc gluconate 50 MG tablet; Take 1 tablet (50 mg total) by mouth daily for 14 days.  Dispense: 14 tablet; Refill: 0 - vitamin C (ASCORBIC ACID) 500 MG tablet; Take 1 tablet (500 mg total) by mouth daily.  Dispense: 30 tablet; Refill: 0 - Novel Coronavirus, NAA ([facility]); Future - Encourage to go to ED or call 9-11 if symptoms worsen.   Family/ staff Communication: Reviewed plan of care with patient   Spent 12 minutes of non-face to face with patient   Labs/tests ordered: - Novel Coronavirus, NAA ([facility]); Future  Sandrea Hughs, NP

## 2019-04-23 NOTE — Telephone Encounter (Signed)
Scheduled TeleVisit with Webb Silversmith

## 2019-04-23 NOTE — Telephone Encounter (Signed)
Pt called to say that she visited her hairdresser on 04/14/19, on 04/20/19 hairdresser called to say that husband is COVID positive.  Pt has had 0 symptoms & wants to know if she should be tested or quarantine?  Thanks, Lattie Haw

## 2019-04-23 NOTE — Patient Instructions (Addendum)
-   Get COVID-19 test done at  [address] in [place].   - Take zinc gluconate 50 MG tablet; Take 1 tablet (50 mg total) by mouth daily for 14 days.  - vitamin C (ASCORBIC ACID) 500 MG tablet; Take 1 tablet (500 mg total) by mouth daily.  - Please go to ED or call 9-11 if symptoms worsen.   This information is directly available on the CDC website: RunningShows.co.za.html    Source:CDC Reference to specific commercial products, manufacturers, companies, or trademarks does not constitute its endorsement or recommendation by the [place], [place], or Centers for [employer] and Prevention.

## 2019-04-26 ENCOUNTER — Ambulatory Visit (INDEPENDENT_AMBULATORY_CARE_PROVIDER_SITE_OTHER): Payer: Medicare Other | Admitting: *Deleted

## 2019-04-26 ENCOUNTER — Other Ambulatory Visit: Payer: Self-pay

## 2019-04-26 DIAGNOSIS — Z20828 Contact with and (suspected) exposure to other viral communicable diseases: Secondary | ICD-10-CM | POA: Diagnosis not present

## 2019-04-26 DIAGNOSIS — Z950 Presence of cardiac pacemaker: Secondary | ICD-10-CM

## 2019-04-26 DIAGNOSIS — Z20822 Contact with and (suspected) exposure to covid-19: Secondary | ICD-10-CM

## 2019-04-26 LAB — CUP PACEART REMOTE DEVICE CHECK
Battery Remaining Longevity: 1 mo
Battery Remaining Percentage: 0.5 %
Battery Voltage: 2.59 V
Brady Statistic AP VP Percent: 23 %
Brady Statistic AP VS Percent: 77 %
Brady Statistic AS VP Percent: 1 %
Brady Statistic AS VS Percent: 1 %
Brady Statistic RA Percent Paced: 47 %
Brady Statistic RV Percent Paced: 67 %
Date Time Interrogation Session: 20201123020007
Implantable Lead Implant Date: 20120702
Implantable Lead Implant Date: 20120702
Implantable Lead Location: 753859
Implantable Lead Location: 753860
Implantable Pulse Generator Implant Date: 20120702
Lead Channel Impedance Value: 300 Ohm
Lead Channel Impedance Value: 410 Ohm
Lead Channel Pacing Threshold Amplitude: 0.625 V
Lead Channel Pacing Threshold Amplitude: 0.75 V
Lead Channel Pacing Threshold Pulse Width: 0.4 ms
Lead Channel Pacing Threshold Pulse Width: 0.8 ms
Lead Channel Sensing Intrinsic Amplitude: 0.6 mV
Lead Channel Sensing Intrinsic Amplitude: 4.6 mV
Lead Channel Setting Pacing Amplitude: 1.625
Lead Channel Setting Pacing Amplitude: 2.5 V
Lead Channel Setting Pacing Pulse Width: 0.8 ms
Lead Channel Setting Sensing Sensitivity: 2 mV
Pulse Gen Model: 2210
Pulse Gen Serial Number: 7255453

## 2019-04-27 LAB — NOVEL CORONAVIRUS, NAA: SARS-CoV-2, NAA: NOT DETECTED

## 2019-05-11 ENCOUNTER — Other Ambulatory Visit: Payer: Self-pay | Admitting: *Deleted

## 2019-05-11 MED ORDER — ONETOUCH ULTRA VI STRP: ORAL_STRIP | 3 refills | Status: DC

## 2019-05-11 NOTE — Telephone Encounter (Signed)
Patient requested refill on her testing strips. Faxed.

## 2019-05-17 ENCOUNTER — Other Ambulatory Visit: Payer: Self-pay | Admitting: Cardiovascular Disease

## 2019-05-17 DIAGNOSIS — I272 Pulmonary hypertension, unspecified: Secondary | ICD-10-CM

## 2019-05-17 NOTE — Telephone Encounter (Signed)
Ok to refill this med or defer to PCP?

## 2019-05-31 ENCOUNTER — Ambulatory Visit
Admission: RE | Admit: 2019-05-31 | Discharge: 2019-05-31 | Disposition: A | Discharge status: Home / Self Care | Payer: Medicare Other | Source: Ambulatory Visit | Attending: Emergency Medicine | Admitting: Emergency Medicine

## 2019-05-31 DIAGNOSIS — R911 Solitary pulmonary nodule: Secondary | ICD-10-CM

## 2019-06-01 ENCOUNTER — Telehealth: Payer: Self-pay | Admitting: Emergency Medicine

## 2019-06-01 ENCOUNTER — Telehealth: Payer: Self-pay | Admitting: Cardiovascular Disease

## 2019-06-01 NOTE — Telephone Encounter (Signed)
Patient states that she received a call on 05/27/19. She called the callback number but it was unavailable. I saw where she had a home remote pacer check that day and it shows completed. Not sure if it was in regards to that appt. Please advise.

## 2019-06-01 NOTE — Telephone Encounter (Signed)
I spoke with the pt and let her know she is on the schedule for 06-28-2019. I told her if that transmission do not come thru I will call her to help her send a manual transmission.

## 2019-06-01 NOTE — Telephone Encounter (Signed)
RB please advise on results. SHe has questions about what she read about the thyroid.

## 2019-06-03 ENCOUNTER — Ambulatory Visit: Payer: Medicare Other | Admitting: Internal Medicine

## 2019-06-07 NOTE — Telephone Encounter (Signed)
Still waiting on response from [place] in regards to the results of pt's CT.

## 2019-06-08 ENCOUNTER — Other Ambulatory Visit: Payer: Self-pay | Admitting: Internal Medicine

## 2019-06-09 NOTE — Telephone Encounter (Signed)
RB please advise on CT results.  Thanks!

## 2019-06-14 ENCOUNTER — Other Ambulatory Visit: Payer: Self-pay

## 2019-06-14 ENCOUNTER — Encounter: Payer: Self-pay | Admitting: Internal Medicine

## 2019-06-14 ENCOUNTER — Ambulatory Visit (INDEPENDENT_AMBULATORY_CARE_PROVIDER_SITE_OTHER): Payer: Medicare Other | Admitting: Internal Medicine

## 2019-06-14 VITALS — BP 118/62 | HR 78 | Temp 97.5°F | Ht 59.0 in | Wt 132.6 lb

## 2019-06-14 DIAGNOSIS — I4891 Unspecified atrial fibrillation: Secondary | ICD-10-CM | POA: Diagnosis not present

## 2019-06-14 DIAGNOSIS — E0590 Thyrotoxicosis, unspecified without thyrotoxic crisis or storm: Secondary | ICD-10-CM | POA: Diagnosis not present

## 2019-06-14 DIAGNOSIS — N3281 Overactive bladder: Secondary | ICD-10-CM

## 2019-06-14 DIAGNOSIS — D6869 Other thrombophilia: Secondary | ICD-10-CM

## 2019-06-14 DIAGNOSIS — I5032 Chronic diastolic (congestive) heart failure: Secondary | ICD-10-CM | POA: Diagnosis not present

## 2019-06-14 DIAGNOSIS — I4819 Other persistent atrial fibrillation: Secondary | ICD-10-CM | POA: Diagnosis not present

## 2019-06-14 DIAGNOSIS — Z1159 Encounter for screening for other viral diseases: Secondary | ICD-10-CM

## 2019-06-14 DIAGNOSIS — I25119 Atherosclerotic heart disease of native coronary artery with unspecified angina pectoris: Secondary | ICD-10-CM

## 2019-06-14 DIAGNOSIS — E118 Type 2 diabetes mellitus with unspecified complications: Secondary | ICD-10-CM | POA: Diagnosis not present

## 2019-06-14 DIAGNOSIS — K766 Portal hypertension: Secondary | ICD-10-CM | POA: Diagnosis not present

## 2019-06-14 MED ORDER — SITAGLIPTIN PHOSPHATE 100 MG PO TABS: 100.0000 mg | ORAL_TABLET | Freq: Every day | ORAL | 3 refills | Status: DC

## 2019-06-14 MED ORDER — ONETOUCH SURESOFT LANCING DEV MISC: 0 refills | Status: DC

## 2019-06-14 MED ORDER — OXYBUTYNIN CHLORIDE 5 MG PO TABS: 5.0000 mg | ORAL_TABLET | Freq: Every day | ORAL | 3 refills | Status: DC

## 2019-06-14 NOTE — Progress Notes (Signed)
Location:  [facility] clinic  Provider: Dr. Hollace Kinnier  Goals of Care:  Advanced Directives 06/14/2019  Does Patient Have a Medical Advance Directive? Yes  Type of Advance Directive Living will  Does patient want to make changes to medical advance directive? No - Patient declined  Copy of [place] in Chart? -  Pre-existing out of facility DNR order (yellow form or pink MOST form) -     Chief Complaint  Patient presents with  . Medical Management of Chronic Issues    4 month follow up, questions about her new diabetes machine  (approval) question about 2 medication that Dr. Maudie Mercury  Ditropan and  Dr. Brynda Rim she wold like Dr. Mariea Clonts to precsribe both.     HPI: Patient is a 74 y.o. female seen today for medical management of chronic diseases.    She states she has been doing well. Does not go out much due to covid. She is still the main caregiver for her husband who has Lewy Body Dementia. Her children live nearby and are very helpful. They deliver groceries and help with chores around the house.   Diuretics make her have urinary frequency throughout the morning and afternoon. She continues to take her oxybutynin daily. Will use bathroom once during the night.   Takes [place] daily. Checks her blood sugar every morning. This morning sugar was 125, she credits late night eating for this.  Limits sugars and carbs. The lancet with her glucometer is broken. Dr. Theda Sers ordered her another one and she received a new glucometer kit. Would like to replace the lancet if model is still available.   Dr. Theda Sers and Dr. Maudie Mercury use to fill Januvia and oxybutynin. She is requesting Dr. Mariea Clonts fill these prescriptions since she does not see these doctors any more.  Needs to make yearly eye appointment, hasn't due to covid.    Mammogram has not been done because of covid.   Interested in covid vaccine when she qualifies to have it.     Past Medical History:  Diagnosis Date  .  Acute on chronic diastolic CHF (congestive heart failure), NYHA class 1 ([place]) 04/29/2013  . Allergic rhinitis   . Atrial fibrillation ([place])    Pacemaker  . Brady-tachy syndrome (HCC)    s/p STJ dual chamber PPM by Dr Sallyanne Kuster  . CAD (coronary artery disease)    70% RCA stenosis, treated medically  . Control of atrial fibrillation with pacemaker ([place])   . Diabetes mellitus ([place])   . DJD (degenerative joint disease)   . Dysrhythmia    Paroxysmal Atrial Fib.  . Hypertension   . Hyperthyroidism   . Paroxysmal atrial fibrillation (HCC)   . Polio    as a child  . PONV (postoperative nausea and vomiting)   . Presence of permanent cardiac pacemaker   . Pulmonary disease     Past Surgical History:  Procedure Laterality Date  . arm surgery d/t fx Right 96  . ATRIAL FIBRILLATION ABLATION  07/31/2012; 08-10-13   PVI by Dr Rayann Heman  . ATRIAL FIBRILLATION ABLATION N/A 07/31/2012   Procedure: ATRIAL FIBRILLATION ABLATION;  Surgeon: Thompson Grayer, MD;  Location: [facility] CATH LAB;  Service: Cardiovascular;  Laterality: N/A;  . ATRIAL FIBRILLATION ABLATION N/A 08/10/2013   Procedure: ATRIAL FIBRILLATION ABLATION;  Surgeon: Coralyn Mark, MD;  Location: [place] CATH LAB;  Service: Cardiovascular;  Laterality: N/A;  . BACK SURGERY  07/26/2012   dR. nUDELMAN  . CARDIAC CATHETERIZATION  12/03/2010  single vessel mid RCA  . CARDIAC ELECTROPHYSIOLOGY MAPPING AND ABLATION  07/31/2012   Dr. Thompson Grayer  . CESAREAN SECTION  09/20/1974   Dr. Gertie Fey  . CHOLECYSTECTOMY  1994  . COLONOSCOPY WITH PROPOFOL N/A 03/21/2015   Procedure: COLONOSCOPY WITH PROPOFOL;  Surgeon: Juanita Craver, MD;  Location: [facility] ENDOSCOPY;  Service: Endoscopy;  Laterality: N/A;  . DIAGNOSTIC MAMMOGRAM  04/16/2017   Hedwig Morton. Morris, DO  . LAPAROSCOPIC HYSTERECTOMY  Late 7s to early 80s  . LEFT HEART CATH AND CORONARY ANGIOGRAPHY N/A 07/30/2017   Procedure: LEFT HEART CATH AND CORONARY ANGIOGRAPHY;  Surgeon: Troy Sine, MD;  Location: [place] CV LAB;  Service: Cardiovascular;  Laterality: N/A;  . LUMBAR LAMINECTOMY/DECOMPRESSION MICRODISCECTOMY Right 12/23/2012   Procedure: Right L5-S1 Laminotomy for resection of synovial cyst;  Surgeon: Hosie Spangle, MD;  Location: [place] NEURO ORS;  Service: Neurosurgery;  Laterality: Right;  Right Lumbar five-sacral one Laminotomy for resection of synovial cyst  . NM MYOCAR PERF WALL MOTION  12/20/2010   normal  . PACEMAKER INSERTION  12/03/10   SJM Accent DR RF implanted by Dr Sallyanne Kuster  . RIGHT HEART CATH N/A 04/22/2018   Procedure: RIGHT HEART CATH;  Surgeon: Wellington Hampshire, MD;  Location: [place] CV LAB;  Service: Cardiovascular;  Laterality: N/A;  . TEE WITHOUT CARDIOVERSION N/A 07/30/2012   Procedure: TRANSESOPHAGEAL ECHOCARDIOGRAM (TEE);  Surgeon: Sanda Klein, MD;  Location: [facility] ENDOSCOPY;  Service: Cardiovascular;  Laterality: N/A;  h&p in file-hope  . TEE WITHOUT CARDIOVERSION N/A 08/09/2013   Procedure: TRANSESOPHAGEAL ECHOCARDIOGRAM (TEE);  Surgeon: Lelon Perla, MD;  Location: [place];  Service: Cardiovascular;  Laterality: N/A;  . TOTAL ABDOMINAL HYSTERECTOMY  1981  . ULTRASOUND GUIDANCE FOR VASCULAR ACCESS  04/22/2018   Procedure: Ultrasound Guidance For Vascular Access;  Surgeon: Wellington Hampshire, MD;  Location: [place] CV LAB;  Service: Cardiovascular;;    Allergies  Allergen Reactions  . Codeine Nausea And Vomiting and Nausea Only  . Ketorolac Nausea And Vomiting, Nausea Only and Other (See Comments)    "pt told to never take this med"  . Meperidine Nausea Only  . Demerol Nausea And Vomiting  . Dronedarone Other (See Comments)    Increased fluid retention  . Cyclobenzaprine Nausea And Vomiting    Outpatient Encounter Medications as of 06/14/2019  Medication Sig  . Cholecalciferol (VITAMIN D3) 1000 UNITS CAPS Take 1 capsule by mouth daily.   . Cyanocobalamin (VITAMIN B12 PO) Take 1 tablet by mouth daily as needed (for energy).   Marland Kitchen DILT-XR 180 MG  24 hr capsule Take 1 capsule by mouth once daily  . furosemide (LASIX) 80 MG tablet Take 1 tablet by mouth once daily  . glucose blood (ONETOUCH ULTRA) test strip One Touch Ultra Blue Test Strips Use daily to check blood sugars. Dx: E11.8  . loperamide (IMODIUM) 2 MG capsule Take by mouth as needed for diarrhea or loose stools.  . methimazole (TAPAZOLE) 10 MG tablet TAKE 1 TABLET BY MOUTH ONCE DAILY WITH FOOD FOR THYROID  . metoprolol tartrate (LOPRESSOR) 50 MG tablet Take 1 tablet (50 mg total) by mouth 2 (two) times daily.  Marland Kitchen oxybutynin (DITROPAN) 5 MG tablet Take 5 mg by mouth daily.   . pantoprazole (PROTONIX) 40 MG tablet Take 1 tablet by mouth once daily  . potassium chloride SA (KLOR-CON) 20 MEQ tablet Take 1 tablet by mouth once daily  . PRADAXA 150 MG CAPS capsule Take 1 capsule by mouth  twice daily  . sildenafil (REVATIO) 20 MG tablet TAKE 1 TABLET BY MOUTH THREE TIMES DAILY  . sitaGLIPtin (JANUVIA) 100 MG tablet Take 100 mg by mouth daily.  Marland Kitchen spironolactone (ALDACTONE) 25 MG tablet Take 1 tablet (25 mg total) by mouth daily.   No facility-administered encounter medications on file as of 06/14/2019.    Review of Systems:  Review of Systems  Constitutional: Positive for fatigue. Negative for activity change and appetite change.  HENT: Negative for dental problem, hearing loss and trouble swallowing.   Eyes: Negative for photophobia.  Respiratory: Negative for cough, shortness of breath and wheezing.   Cardiovascular: Negative for chest pain and leg swelling.  Gastrointestinal: Negative for abdominal pain, constipation, diarrhea and nausea.  Endocrine: Negative for polydipsia, polyphagia and polyuria.  Genitourinary: Positive for frequency. Negative for dysuria, hematuria and vaginal bleeding.  Musculoskeletal: Negative.   Skin: Negative.   Neurological: Negative for dizziness, weakness and headaches.  Psychiatric/Behavioral: Negative for dysphoric mood and sleep disturbance. The  patient is not nervous/anxious.   All other systems reviewed and are negative.   Health Maintenance  Topic Date Due  . Hepatitis C Screening  [birth year]  . FOOT EXAM  03/02/1956  . OPHTHALMOLOGY EXAM  03/02/1956  . URINE MICROALBUMIN  03/02/1956  . PNA vac Low Risk Adult (2 of 2 - PCV13) 02/10/2019  . MAMMOGRAM  04/17/2019  . HEMOGLOBIN A1C  06/19/2019  . TETANUS/TDAP  03/06/2023  . COLONOSCOPY  03/20/2025  . INFLUENZA VACCINE  Completed  . DEXA SCAN  Completed    Physical Exam: Vitals:   06/14/19 1521  BP: 118/62  Pulse: 78  Temp: (!) 97.5 F (36.4 C)  TempSrc: Temporal  SpO2: 98%  Weight: 132 lb 9.6 oz (60.1 kg)  Height: '4\' 11"'$  (1.499 m)   Body mass index is 26.78 kg/m. Physical Exam Vitals reviewed.  Constitutional:      Appearance: Normal appearance.  Cardiovascular:     Rate and Rhythm: Normal rate. Rhythm irregular.     Pulses: Normal pulses.          Dorsalis pedis pulses are 2+ on the right side and 2+ on the left side.       Posterior tibial pulses are 2+ on the right side and 2+ on the left side.     Heart sounds: Normal heart sounds. No murmur.  Pulmonary:     Effort: Pulmonary effort is normal.     Breath sounds: Normal breath sounds.  Abdominal:     General: Bowel sounds are normal.     Palpations: Abdomen is soft.  Musculoskeletal:     Right lower leg: No edema.     Left lower leg: No edema.  Feet:     Right foot:     Protective Sensation: 10 sites tested. 10 sites sensed.     Skin integrity: Skin integrity normal.     Toenail Condition: Right toenails are normal.     Left foot:     Protective Sensation: 10 sites tested. 10 sites sensed.     Skin integrity: Skin integrity normal.     Toenail Condition: Left toenails are normal.  Skin:    General: Skin is warm and dry.     Capillary Refill: Capillary refill takes less than 2 seconds.  Neurological:     General: No focal deficit present.     Mental Status: She is alert and oriented to  person, place, and time. Mental status is at baseline.  Motor: No weakness.     Gait: Gait normal.  Psychiatric:        Mood and Affect: Mood normal.        Behavior: Behavior normal.        Thought Content: Thought content normal.        Judgment: Judgment normal.     Labs reviewed: Basic Metabolic Panel: Recent Labs    11/04/18 1057 12/15/18 1043  NA 138  --   K 4.3  --   CL 96  --   CO2 22  --   GLUCOSE 124*  --   BUN 16  --   CREATININE 1.33*  --   CALCIUM 9.7  --   TSH  --  0.962   Liver Function Tests: No results for input(s): AST, ALT, ALKPHOS, BILITOT, PROT, ALBUMIN in the last 8760 hours. No results for input(s): LIPASE, AMYLASE in the last 8760 hours. No results for input(s): AMMONIA in the last 8760 hours. CBC: Recent Labs    11/04/18 1128  WBC 7.7  NEUTROABS 5.8  HGB 14.7  HCT 46.1  MCV 91  PLT 354   Lipid Panel: No results for input(s): CHOL, HDL, LDLCALC, TRIG, CHOLHDL, LDLDIRECT in the last 8760 hours. Lab Results  Component Value Date   HGBA1C 6.1 (H) 12/17/2018    Procedures since last visit: CT Chest Wo Contrast  Result Date: 05/31/2019 . CLINICAL DATA: Follow-up chest x-ray for lung nodule. EXAM: CT CHEST WITHOUT CONTRAST TECHNIQUE: Multidetector CT imaging of the chest was performed following the standard protocol without IV contrast. COMPARISON:  11/23/2018 FINDINGS: Cardiovascular: Dual lead pacer device, power pack over left chest leads entering via subclavian approach terminating in right heart. Vascular structures with limited assessment due to lack of intravenous contrast. Findings of atherosclerosis in the thoracic aorta are similar to the prior study along with changes of calcified coronary artery disease. No signs of pericardial effusion. Normal caliber central pulmonary vasculature. Mediastinum/Nodes: Marked goiter is enlargement of the thyroid with extension into substernal chest with similar appearance to prior exam, displacing  the trachea from left to right. Scattered small lymph nodes throughout the chest, largest a subcarinal lymph node at 13 mm short axis unchanged from previous exam. Mild enlargement of right hilar lymph nodes similar dating back to exam of 04/23/2018 Lungs/Pleura: Numerous pulmonary nodules with apical predominance showing ill-defined margins and bronchocentric distribution. Findings are not significantly changed compared to the most recent comparison study with respect to the small nodules. Ill-defined right lower lobe pulmonary nodule measuring approximately 6 mm is unchanged. Nodule in left lung base peripherally approximately 7 mm (image 92, series 9) (Image 69, series 9) right lower lobe nodule seen medially measuring approximately 8 mm also unchanged. New findings on today's study includes small right-sided pleural effusion and areas of ground-glass opacity with trace left pleural effusion. Is Airways are patent. Upper Abdomen: Post cholecystectomy. Abdominal viscera are incompletely imaged. Musculoskeletal: No acute bone finding or destructive bone process. IMPRESSION: 1. New findings on today's study includes small right-sided pleural effusion and areas of ground-glass opacity with trace left pleural effusion. This could be related to mild background volume overload or heart failure and has been seen to varying degrees on previous exams. 2. Numerous bilateral pulmonary nodules throughout the chest with similar appearance. Findings are nonspecific and been present over time. Findings could be seen in setting of pulmonary vasculitis or similar process. Metastatic disease is not favored given the presence of many if not all  of these nodules on prior studies, even studies dating back to 2012. 3. Stable mild mediastinal and right hilar adenopathy, likely reactive. 4. Stable marked goiter with extension into the substernal chest, displacing the trachea from left to right. 5. Coronary artery disease and aortic  atherosclerosis. Aortic Atherosclerosis (ICD10-I70.0). Electronically Signed   By: Zetta Bills M.D.   On: 05/31/2019 15:43    Assessment/Plan 1. Controlled type 2 diabetes mellitus with complication, without long-term current use of insulin (HCC) - stable at this time - suspect morning glucose is elevated to late night dinner - continue to check morning glucose - limit foods high in sugar and carbs - please have eyes examined within next few months - hemoglobin A1C- future - urine microalbumin- future - complete metabolic panek with GFR- future - reorder one touch penlet lancet- today  2. Chronic diastolic heart failure - followed by cardiology - continue daily weights and bp - continue current diuretic regimen - limit sodium intake - cbc with differential/platelets- future  3. Atherosclerosis of native coronary artery of native heart with angina pectoris ([place]) - at risk for cardiac event - continue current bp, lipid, glucose regimen - lipid panel- future  4. Other persistent atrial fibrillation (HCC) - rate controlled with metoprolol and dilatizem - has dual- chamber pacemaker with Dr. Rayann Heman - continue current pradaxa regimen  5. Hypercoagulable state due to atrial fibrillation - continue current pradaxa regimen for stroke prevention  6. Portal hypertension (HCC) - bp at goal< 150/90 - continue current bp medications  7. Encounter for hepatitis C screening test for low risk patient - hep C antibody- future  8. Overactive bladder - stable, no progression with medication - continue oxybutynin regimen    Labs/tests ordered:  Cbc with differential/platelet, complete metabolic panel with GFR, hemoglobin A1C, Hep C antibody, lipid panel, microalbumin/creatitine urine ratio- future Next appt:  4 month follow up

## 2019-06-17 NOTE — Telephone Encounter (Signed)
I discussed the Ct scan findings with her today, reviewed the nodule findings, thyroid findings. She denies dyspnea or dysphagia. We likely do not need to do a repeat unless sx evolve. She understands the plans.

## 2019-06-17 NOTE — Telephone Encounter (Signed)
RB please advise. Thanks.  

## 2019-06-18 ENCOUNTER — Ambulatory Visit: Payer: Medicare Other | Admitting: Physician Assistant

## 2019-06-18 ENCOUNTER — Telehealth: Payer: Self-pay | Admitting: *Deleted

## 2019-06-18 NOTE — Telephone Encounter (Signed)
Please keep the 2/15 appointment. She will need a new RV lead when we change out her generator and might need to spend the night.. We have until mid-April to schedule the procedure and hopefully there will be less COVID around then (wishful thinking?)

## 2019-06-18 NOTE — Telephone Encounter (Signed)
[place] alert received for PPM at [facility] as of 06/16/19.  Spoke with patient to make her aware. Currently scheduled for f/u with Dr. Sallyanne Kuster on 07/19/19. Pt aware that if Dr. Sallyanne Kuster recommends sooner f/u, we will contact her. All questions answered, pt denies additional concerns at this time.

## 2019-06-21 NOTE — Telephone Encounter (Signed)
LMOM (DPR) advising patient to keep 07/19/19 appointment.

## 2019-06-22 ENCOUNTER — Other Ambulatory Visit: Payer: Self-pay | Admitting: Cardiovascular Disease

## 2019-06-22 DIAGNOSIS — I272 Pulmonary hypertension, unspecified: Secondary | ICD-10-CM

## 2019-06-28 ENCOUNTER — Ambulatory Visit (INDEPENDENT_AMBULATORY_CARE_PROVIDER_SITE_OTHER): Payer: Medicare Other | Admitting: *Deleted

## 2019-06-28 DIAGNOSIS — Z950 Presence of cardiac pacemaker: Secondary | ICD-10-CM

## 2019-06-28 LAB — CUP PACEART REMOTE DEVICE CHECK
Battery Remaining Longevity: 0 mo
Battery Voltage: 2.56 V
Brady Statistic AP VP Percent: 23 %
Brady Statistic AP VS Percent: 76 %
Brady Statistic AS VP Percent: 1 %
Brady Statistic AS VS Percent: 1 %
Brady Statistic RA Percent Paced: 45 %
Brady Statistic RV Percent Paced: 69 %
Date Time Interrogation Session: 20210125020014
Implantable Lead Implant Date: 20120702
Implantable Lead Implant Date: 20120702
Implantable Lead Location: 753859
Implantable Lead Location: 753860
Implantable Pulse Generator Implant Date: 20120702
Lead Channel Impedance Value: 310 Ohm
Lead Channel Impedance Value: 410 Ohm
Lead Channel Pacing Threshold Amplitude: 0.625 V
Lead Channel Pacing Threshold Amplitude: 0.75 V
Lead Channel Pacing Threshold Pulse Width: 0.4 ms
Lead Channel Pacing Threshold Pulse Width: 0.8 ms
Lead Channel Sensing Intrinsic Amplitude: 0.6 mV
Lead Channel Sensing Intrinsic Amplitude: 5.2 mV
Lead Channel Setting Pacing Amplitude: 1.625
Lead Channel Setting Pacing Amplitude: 2.5 V
Lead Channel Setting Pacing Pulse Width: 0.8 ms
Lead Channel Setting Sensing Sensitivity: 2 mV
Pulse Gen Model: 2210
Pulse Gen Serial Number: 7255453

## 2019-07-02 NOTE — Addendum Note (Signed)
Addended by: Douglass Rivers D on: 07/02/2019 09:10 AM   Modules accepted: Level of Service

## 2019-07-13 ENCOUNTER — Ambulatory Visit (INDEPENDENT_AMBULATORY_CARE_PROVIDER_SITE_OTHER): Payer: Medicare Other | Admitting: Cardiovascular Disease

## 2019-07-13 ENCOUNTER — Other Ambulatory Visit: Payer: Self-pay

## 2019-07-13 VITALS — BP 104/64

## 2019-07-13 DIAGNOSIS — I25119 Atherosclerotic heart disease of native coronary artery with unspecified angina pectoris: Secondary | ICD-10-CM | POA: Diagnosis not present

## 2019-07-13 DIAGNOSIS — I2510 Atherosclerotic heart disease of native coronary artery without angina pectoris: Secondary | ICD-10-CM | POA: Diagnosis not present

## 2019-07-13 DIAGNOSIS — Z7901 Long term (current) use of anticoagulants: Secondary | ICD-10-CM | POA: Diagnosis not present

## 2019-07-13 DIAGNOSIS — I495 Sick sinus syndrome: Secondary | ICD-10-CM | POA: Diagnosis not present

## 2019-07-13 DIAGNOSIS — Z45010 Encounter for checking and testing of cardiac pacemaker pulse generator [battery]: Secondary | ICD-10-CM

## 2019-07-13 DIAGNOSIS — I2721 Secondary pulmonary arterial hypertension: Secondary | ICD-10-CM

## 2019-07-13 DIAGNOSIS — I5032 Chronic diastolic (congestive) heart failure: Secondary | ICD-10-CM

## 2019-07-13 DIAGNOSIS — I441 Atrioventricular block, second degree: Secondary | ICD-10-CM | POA: Diagnosis not present

## 2019-07-13 DIAGNOSIS — I4811 Longstanding persistent atrial fibrillation: Secondary | ICD-10-CM | POA: Diagnosis not present

## 2019-07-13 DIAGNOSIS — Z01818 Encounter for other preprocedural examination: Secondary | ICD-10-CM | POA: Diagnosis not present

## 2019-07-13 NOTE — Patient Instructions (Signed)
Medication Instructions: Your physician recommends that you continue on your current medications as directed. Please refer to the Current Medication list given to you today.  * If you need a refill on your cardiac medications before your next appointment, please call your pharmacy. *  Labwork: Your provider would like for you to have the following labs today: CBC and BMET  You will need to have the coronavirus test completed prior to your procedure. An appointment has been made at 2:40 pm on 08/05/19.  This is a Drive Up Visit at the [facility] [address]. Please tell them that you are there for pre procedure testing. Someone will direct you to the appropriate testing line. Stay in your car and someone will be with you shortly.Please make sure to have all other labs completed before this test because you will need to stay quarantined until your procedure.  * Will notify you of abnormal results, otherwise continue current treatment plan.*  Testing/Procedures: Your physician has recommended that you have a pacemaker/defibrillator generator change (battery change). Please follow the instructions below, located under the special instructions section.  Follow-Up: Your physician recommends that you schedule a wound check appointment 10-14 days, after your procedure on 08/09/19, with the device clinic.  Your physician recommends that you schedule a follow up appointment in 91 days, after your procedure on 08/09/19, with Dr. Sallyanne Kuster.  Thank you for choosing [facility]!!      Any Other Special Instructions Will Be Listed Below (If Applicable).    [facility], [address]  [place], [place] [address]  Phone: [phone] Fax: [phone]    Generator Change Procedure Instructions  You are scheduled for a Generator Change (battery change) on  08/09/19  with Dr. Sallyanne Kuster.  1. Please arrive at the [facility], Entrance "A"   at [facility] at  11:30 on the day of your procedure. (The address is [address]  2. DIET: Do not eat or drink after midnight the night before your procedure.  3. LABS:  Your provider would like for you to have the following labs today: CBC and BMET  You will need to have the coronavirus test completed prior to your procedure. An appointment has been made at 2:40 pm on 08/05/19.  This is a Drive Up Visit at the [facility] [address]. Please tell them that you are there for pre procedure testing. Someone will direct you to the appropriate testing line. Stay in your car and someone will be with you shortly.Please make sure to have all other labs completed before this test because you will need to stay quarantined until your procedure.   4. MEDICATIONS:  Hold the Furosemide and the Spironolactone the morning of the procedure   5.  Plan for an overnight stay.  Bring your insurance cards and a list of you medications.  6.  Wash your chest and neck with surgical scrub the evening before and the morning of your procedure.  Rinse well. Please review the surgical scrub instruction sheet given to you.   7. Your chest will need to be shaved prior to this procedure (if needed). We ask that you do this yourself at home 1 to 2 days before or if uncomfortable/unable to do yourself, then it will be performed by the hospital staff the day of.  * Special note:  Every effort is made to have your procedure done on time.  Occasionally there are emergencies that present themselves at the hospital that may cause delays.  Please be patient if a delay does occur.                                                                                                           * If you have any questions after you get home, please call Lattie Haw, RN at [phone].    [place] - Preparing For Surgery  Before surgery, you can play an important role. Because skin is not sterile, your  skin needs to be as free of germs as possible. You can reduce the number of germs on your skin by washing with CHG (chlorahexidine gluconate) Soap before surgery.  CHG is an antiseptic cleaner which kills germs and bonds with the skin to continue killing germs even after washing.   Please do not use if you have an allergy to CHG or antibacterial soaps.  If your skin becomes reddened/irritated stop using the CHG.   Do not shave (including legs and underarms) for at least 48 hours prior to first CHG shower.  It is OK to shave your face.  Please follow these instructions carefully:  1.  Shower the night before surgery and the morning of surgery with CHG.  2.  If you choose to wash your hair, wash your hair first as usual with your normal shampoo.  3.  After you shampoo, rinse your hair and body thoroughly to remove the shampoo.  4.  Use CHG as you would any other liquid soap.  You can apply CHG directly to the skin and wash gently with a clean washcloth. 5.  Apply the CHG Soap to your body ONLY FROM THE NECK DOWN.  Do not use on open wounds or open sores.  Avoid contact with your eyes, ears, mouth and genitals (private parts).  Wash genitals (private parts) with your normal soap.  6.  Wash thoroughly, paying special attention to the area where your surgery will be performed.  7.  Thoroughly rise your body with warm water from the neck down.   8.  DO NOT shower/wash with your normal soap after using and rinsing off the CHG soap.  9.  Pat yourself dry with a clean towel.           10.  Wear clean pajamas.           11.  Place clean sheets on your bed the night of your first shower and do not sleep with pets.  Day of Surgery: Do not apply any deodorants/lotions.  Please wear clean clothes to the hospital/surgery center.

## 2019-07-15 ENCOUNTER — Other Ambulatory Visit: Payer: Self-pay | Admitting: Cardiovascular Disease

## 2019-07-16 ENCOUNTER — Other Ambulatory Visit: Payer: Self-pay | Admitting: Internal Medicine

## 2019-07-16 NOTE — Progress Notes (Signed)
Patient ID: Hailey [name], female   DOB: Oct 27, 1945, 74 y.o.   MRN: [id number]    Cardiology Office Note    Date:  07/16/2019   ID:  Hailey, [name] 1946/06/02, MRN [id number]  PCP:  Gayland Curry, DO  Cardiologist:  Thompson Grayer, MD;  Sanda Klein, MD   No chief complaint on file.   History of Present Illness:  Hailey [name] is a 74 y.o. female with longstanding persistent atrial fibrillation (s/p RF ablation procedures),s/p dual-chamber permanent pacemaker (St. Jude Accent 2012 for tachycardia-bradycardia syndrome and 2:1 atrioventricular block), CAD (nonobstructive by cath February 2019), chronic diastolic heart failure (history of acute decompensation on Multaq), multifactorial pulmonary artery hypertension, type 2 diabetes mellitus, hypertension, good lipid profile without therapy, hyperthyroidism (on propylthiouracil).  In the past she was highly symptomatic when she lost ventricular capture due to steady worsening of ventricular pacing thresholds.  For this reason her RV pacemaker lead has been reprogrammed in unipolar pacing configuration.  Her device has just reached ERI.  She was in the office today with her husband [name] and we took advantage of this to schedule her pacemaker generator change.  She will also need an RV lead revision.  She has been in persistent atrial fibrillation since January 2020, over 1 year now.  Her pacemaker can be reprogrammed VVIR.  Atrial fibrillation not appear to cause symptoms we have decided not to treat with antiarrhythmics or cardioversion.  Echocardiogram July 2020 showed normal left ventricular systolic function and EF of 60-65%, pseudonormal mitral inflow, moderately enlarged right ventricle, biatrial mild dilation, estimated systolic PA pressure 40 mmHg.  Previous right heart catheterization showed that her pulmonary hypertension could not be explained exclusively based on elevated left heart filling pressures.  She was evaluated in the  rheumatology clinic by Dr. Amil Amen since she had an elevated ANA of 1: 640, but all her other serological markers were negative for lupus, rheumatoid arthritis or other connective tissue diseases.  Chest CT has shown numerous bilateral pulmonary nodules felt to be nonspecific.  These have been present back as far as 2012.  Repeat pulmonary function tests have been planned in June, but have not been performed.  She has a large goiter and is on chronic antithyroid medications.  Past Medical History:  Diagnosis Date  . Acute on chronic diastolic CHF (congestive heart failure), NYHA class 1 ([place]) 04/29/2013  . Allergic rhinitis   . Atrial fibrillation ([place])    Pacemaker  . Brady-tachy syndrome (HCC)    s/p STJ dual chamber PPM by Dr Sallyanne Kuster  . CAD (coronary artery disease)    70% RCA stenosis, treated medically  . Control of atrial fibrillation with pacemaker ([place])   . Diabetes mellitus ([place])   . DJD (degenerative joint disease)   . Dysrhythmia    Paroxysmal Atrial Fib.  . Hypertension   . Hyperthyroidism   . Paroxysmal atrial fibrillation (HCC)   . Polio    as a child  . PONV (postoperative nausea and vomiting)   . Presence of permanent cardiac pacemaker   . Pulmonary disease     Past Surgical History:  Procedure Laterality Date  . arm surgery d/t fx Right 96  . ATRIAL FIBRILLATION ABLATION  07/31/2012; 08-10-13   PVI by Dr Rayann Heman  . ATRIAL FIBRILLATION ABLATION N/A 07/31/2012   Procedure: ATRIAL FIBRILLATION ABLATION;  Surgeon: Thompson Grayer, MD;  Location[facility] C[facility] LAB;  Service: Cardiovascular;  Laterality: N/A;  . ATRIAL FIBRILLATION ABLATION N/A 08/10/2013  Procedure: ATRIAL FIBRILLATION ABLATION;  Surgeon: Coralyn Mark, MD;  Location: [place] CATH LAB;  Service: Cardiovascular;  Laterality: N/A;  . BACK SURGERY  07/26/2012   dR. nUDELMAN  . CARDIAC CATHETERIZATION  12/03/2010   single vessel mid RCA  . CARDIAC ELECTROPHYSIOLOGY MAPPING AND ABLATION  07/31/2012   Dr. Thompson Grayer   . CESAREAN SECTION  09/20/1974   Dr. Gertie Fey  . CHOLECYSTECTOMY  1994  . COLONOSCOPY WITH PROPOFOL N/A 03/21/2015   Procedure: COLONOSCOPY WITH PROPOFOL;  Surgeon: Juanita Craver, MD;  Location: [facility] ENDOSCOPY;  Service: Endoscopy;  Laterality: N/A;  . DIAGNOSTIC MAMMOGRAM  04/16/2017   Hedwig Morton. Morris, DO  . LAPAROSCOPIC HYSTERECTOMY  Late 19s to early 80s  . LEFT HEART CATH AND CORONARY ANGIOGRAPHY N/A 07/30/2017   Procedure: LEFT HEART CATH AND CORONARY ANGIOGRAPHY;  Surgeon: Troy Sine, MD;  Location: [facility] CV LAB;  Service: Cardiovascular;  Laterality: N/A;  . LUMBAR LAMINECTOMY/DECOMPRESSION MICRODISCECTOMY Right 12/23/2012   Procedure: Right L5-S1 Laminotomy for resection of synovial cyst;  Surgeon: Hosie Spangle, MD;  Location: [facility] NEURO ORS;  Service: Neurosurgery;  Laterality: Right;  Right Lumbar five-sacral one Laminotomy for resection of synovial cyst  . NM MYOCAR PERF WALL MOTION  12/20/2010   normal  . PACEMAKER INSERTION  12/03/10   SJM Accent DR RF implanted by Dr Sallyanne Kuster  . RIGHT HEART CATH N/A 04/22/2018   Procedure: RIGHT HEART CATH;  Surgeon: Wellington Hampshire, MD;  Location: [facility] CV LAB;  Service: Cardiovascular;  Laterality: N/A;  . TEE WITHOUT CARDIOVERSION N/A 07/30/2012   Procedure: TRANSESOPHAGEAL ECHOCARDIOGRAM (TEE);  Surgeon: Sanda Klein, MD;  [facility] ENDOSCOPY;  Service: Cardiovascular;  Laterality: N/A;  h&p in file-hope  . TEE WITHOUT CARDIOVERSION N/A 08/09/2013   Procedure: TRANSESOPHAGEAL ECHOCARDIOGRAM (TEE);  Surgeon: Lelon Perla, MD;  Location: [facility];  Service: Cardiovascular;  Laterality: N/A;  . TOTAL ABDOMINAL HYSTERECTOMY  1981  . ULTRASOUND GUIDANCE FOR VASCULAR ACCESS  04/22/2018   Procedure: Ultrasound Guidance For Vascular Access;  Surgeon: Wellington Hampshire, MD;  Location: [facility] CV LAB;  Service: Cardiovascular;;    Current Medications: Outpatient Medications Prior to Visit  Medication Sig Dispense Refill   . Cholecalciferol (VITAMIN D3) 1000 UNITS CAPS Take 1 capsule by mouth daily.     . Cyanocobalamin (VITAMIN B12 PO) Take 1 tablet by mouth daily as needed (for energy).     Marland Kitchen DILT-XR 180 MG 24 hr capsule Take 1 capsule by mouth once daily 90 capsule 3  . furosemide (LASIX) 80 MG tablet Take 1 tablet by mouth once daily 90 tablet 3  . glucose blood (ONETOUCH ULTRA) test strip One Touch Ultra Blue Test Strips Use daily to check blood sugars. Dx: E11.8 100 each 3  . Lancets Misc. (ONE TOUCH SURESOFT) MISC Use to test  Blood sugar once daily DX: E11.8, 1 each 0  . loperamide (IMODIUM) 2 MG capsule Take by mouth as needed for diarrhea or loose stools.    . methimazole (TAPAZOLE) 10 MG tablet TAKE 1 TABLET BY MOUTH ONCE DAILY WITH FOOD FOR THYROID 90 tablet 1  . metoprolol tartrate (LOPRESSOR) 50 MG tablet Take 1 tablet (50 mg total) by mouth 2 (two) times daily. 180 tablet 3  . oxybutynin (DITROPAN) 5 MG tablet Take 1 tablet (5 mg total) by mouth daily. 90 tablet 3  . pantoprazole (PROTONIX) 40 MG tablet Take 1 tablet by mouth once daily 90 tablet 1  . potassium  chloride SA (KLOR-CON) 20 MEQ tablet Take 1 tablet by mouth once daily 90 tablet 1  . PRADAXA 150 MG CAPS capsule Take 1 capsule by mouth twice daily 180 capsule 3  . sildenafil (REVATIO) 20 MG tablet TAKE 1 TABLET BY MOUTH THREE TIMES DAILY 90 tablet 3  . sitaGLIPtin (JANUVIA) 100 MG tablet Take 1 tablet (100 mg total) by mouth daily. 90 tablet 3  . spironolactone (ALDACTONE) 25 MG tablet Take 1 tablet (25 mg total) by mouth daily. 90 tablet 1   No facility-administered medications prior to visit.     Allergies:   Codeine, Ketorolac, Meperidine, Demerol, Dronedarone, and Cyclobenzaprine   Social History   Socioeconomic History  . Marital status: Married    Spouse name: [name]  . Number of children: 3  . Years of education: 64  . Highest education level: Some college, no degree  Occupational History  . Occupation: housewife   Tobacco Use  . Smoking status: Never Smoker  . Smokeless tobacco: Never Used  Substance and Sexual Activity  . Alcohol use: No  . Drug use: No  . Sexual activity: Not Currently    Birth control/protection: Abstinence  Other Topics Concern  . Not on file  Social History Narrative   Lives in [place].  Retired.  Was office clerk in several companies.  Did some technical school after completing high school.  She has no pets.  She and her husband live in a one-story home.  They've been married since 1965.  She follows a heart healthy diet.  She has a living will and hcpoa but does not have a DNR form.  She does struggle to prepare food.     Social Determinants of Health   Financial Resource Strain:   . Difficulty of Paying Living Expenses: Not on file  Food Insecurity:   . Worried About Charity fundraiser in the Last Year: Not on file  . Ran Out of Food in the Last Year: Not on file  Transportation Needs:   . Lack of Transportation (Medical): Not on file  . Lack of Transportation (Non-Medical): Not on file  Physical Activity:   . Days of Exercise per Week: Not on file  . Minutes of Exercise per Session: Not on file  Stress:   . Feeling of Stress : Not on file  Social Connections:   . Frequency of Communication with Friends and Family: Not on file  . Frequency of Social Gatherings with Friends and Family: Not on file  . Attends Religious Services: Not on file  . Active Member of Clubs or Organizations: Not on file  . Attends Archivist Meetings: Not on file  . Marital Status: Not on file     Family History:  The patient's family history includes Breast cancer in her sister, sister, and sister; Cancer in her brother; Colon cancer in her sister; Dementia in her brother and sister; Diabetes in her sister; Heart attack in her sister; Heart disease in her sister and sister; Prostate cancer in her brother; Suicidality in her brother.   ROS:   Please see the history of  present illness.    ROS All other systems are reviewed and are negative.   PHYSICAL EXAM:   VS:  BP 104/64      General: Alert, oriented x3, no distress, healthy left subclavian pacemaker site Head: no evidence of trauma, PERRL, EOMI, no exophtalmos or lid lag, no myxedema, no xanthelasma; normal ears, nose and oropharynx Neck:  normal jugular venous pulsations and no hepatojugular reflux; brisk carotid pulses without delay and no carotid bruits Chest: clear to auscultation, no signs of consolidation by percussion or palpation, normal fremitus, symmetrical and full respiratory excursions Cardiovascular: normal position and quality of the apical impulse, regular rhythm, normal first and second heart sounds, no murmurs, rubs or gallops Abdomen: no tenderness or distention, no masses by palpation, no abnormal pulsatility or arterial bruits, normal bowel sounds, no hepatosplenomegaly Extremities: no clubbing, cyanosis or edema; 2+ radial, ulnar and brachial pulses bilaterally; 2+ right femoral, posterior tibial and dorsalis pedis pulses; 2+ left femoral, posterior tibial and dorsalis pedis pulses; no subclavian or femoral bruits Neurological: grossly nonfocal Psych: Normal mood and affect    Wt Readings from Last 3 Encounters:  06/14/19 132 lb 9.6 oz (60.1 kg)  04/23/19 130 lb 9.6 oz (59.2 kg)  12/17/18 130 lb (59 kg)      Studies/Labs Reviewed:   Right heart catheterization April 22, 2018 1. Mildly elevated left-sided filling pressures. 2. Moderate to severe pulmonary hypertension with normal cardiac output.  Pulmonary capillary wedge pressure: 19 mmHg. PA pressure: 68/28 with a mean of 43 mmHg. Pulmonary vascular resistance: 4.9 [place] units Cardiac index was 2.97.  Recommendations: The patient seems to have mixed pulmonary hypertension due to left-sided heart failure and primary pulmonary hypertension. She is mildly volume overloaded and I suspect that she can switch to  oral furosemide tomorrow. Consider vasodilator therapy for pulmonary hypertension.  Left heart catheterization July 30, 2017   Ost 1st Diag to 1st Diag lesion is 10% stenosed.  Mid RCA lesion is 20% stenosed.  The left ventricular systolic function is normal.  LV end diastolic pressure is normal.  No significant coronary obstructive disease with mild smooth 10% narrowing in the proximal to midportion of the first diagonal branch of the LAD; normal left circumflex coronary artery; and smooth 20% mid RCA narrowing.  Normal LV function without focal segmental wall motion abnormalities. LVEDP 7.  RECOMMENDATION: Medical therapy.  Echo April 20, 2018   - Left ventricle: The cavity size was normal. Systolic function was normal. The estimated ejection fraction was in the range of 55% to 60%. Wall motion was normal; there were no regional wall motion abnormalities. There was a reduced contribution of atrial contraction to ventricular filling, due to increased ventricular diastolic pressure or atrial contractile dysfunction. Features are consistent with a pseudonormal left ventricular filling pattern, with concomitant abnormal relaxation and increased filling pressure (grade 2 diastolic dysfunction). - Left atrium: The atrium was mildly dilated. - Right ventricle: The cavity size was mildly dilated. Wall thickness was normal. - Right atrium: The atrium was mildly dilated. - Tricuspid valve: There was mild-moderate regurgitation directed centrally. - Pulmonary arteries: Systolic pressure was moderately to severely increased. PA peak pressure: 66 mm Hg (S).  Impressions:  - PA pressure has increased since the previous study.      EKG:  EKG is not ordered today.  The intracardiac electrogram shows atrial fibrillation and ventricular pacing.  Lipid Panel    Component Value Date/Time   CHOL 94 11/24/2010 0837   TRIG 51 11/24/2010 0837    HDL 53 11/24/2010 0837   CHOLHDL 1.8 11/24/2010 0837   VLDL 10 11/24/2010 0837   LDLCALC 31 11/24/2010 0837    ASSESSMENT:    1. Chronic diastolic heart failure ([place])   2. PAH (pulmonary artery hypertension) ([place])   3. Longstanding persistent atrial fibrillation ([place])   4. Long term current use  of anticoagulant   5. SSS (sick sinus syndrome) (HCC)   6. Second degree AV block   7. Pacemaker battery depletion   8. Atherosclerosis of native coronary artery of native heart without angina pectoris   9. Pre-op testing      PLAN:  In order of problems listed above:   1. CHF: Appears clinically euvolemic, NYHA functional class II. 2. PAH: Etiology likely multifactorial.  There is some contribution of diastolic heart failure.  There also is clearly a component of intrinsic arteriolar disease.  Equivocal evidence of interstitial lung disease and rheumatological disorders. 3. AFib: Asymptomatic, well rate controlled, on chronic anticoagulation.  Plan to treat as a permanent arrhythmia.   CHADSVasc at least 5 (age, gender, DM, CAD, history of CHF). 4. Anticoagulation: Denies bleeding problems. 5. SSS: Precedes diagnosis of persistent atrial fibrillation. 6. 2nd deg AVB: She felt very poorly when she lost ventricular capture.  Better after we switched the ventricular pacing configuration to unipolar. 7. PPM: This has generator change out in right ventricular lead revision.  We will plan to check patency of the left subclavian vein with a venogram.  If this looks to be okay, we will simply place a new right ventricular lead from the left subclavian.  If the left subclavian artery is occluded, we will abandon the system on the left side and place a new single-chamber pacemaker from the right. This procedure has been fully reviewed with the patient and informed consent has been obtained. 8. CAD: Denies angina pectoris.  Nonobstructive disease by cath in February 2019   Medication Adjustments/Labs  and Tests Ordered: Current medicines are reviewed at length with the patient today.  Concerns regarding medicines are outlined above.  Medication changes, Labs and Tests ordered today are listed in the Patient Instructions below. Patient Instructions  Medication Instructions: Your physician recommends that you continue on your current medications as directed. Please refer to the Current Medication list given to you today.  * If you need a refill on your cardiac medications before your next appointment, please call your pharmacy. *  Labwork: Your provider would like for you to have the following labs today: CBC and BMET  You will need to have the coronavirus test completed prior to your procedure. An appointment has been made at 2:40 pm on 08/05/19.  This is a Drive Up Visit at the [facility] [address] Please tell them that you are there for pre procedure testing. Someone will direct you to the appropriate testing line. Stay in your car and someone will be with you shortly.Please make sure to have all other labs completed before this test because you will need to stay quarantined until your procedure.  * Will notify you of abnormal results, otherwise continue current treatment plan.*  Testing/Procedures: Your physician has recommended that you have a pacemaker/defibrillator generator change (battery change). Please follow the instructions below, located under the special instructions section.  Follow-Up: Your physician recommends that you schedule a wound check appointment 10-14 days, after your procedure on 08/09/19, with the device clinic.  Your physician recommends that you schedule a follow up appointment in 91 days, after your procedure on 08/09/19, with Dr. Sallyanne Kuster.  Thank you for choosing [facility]!!      Any Other Special Instructions Will Be Listed Below (If Applicable).    [facility], [address]   [place], [place] [address]  Phone: [phone] Fax: [phone]  Generator Change Procedure Instructions  You are scheduled for a Generator Change (battery change) on  08/09/19  with Dr. Sallyanne Kuster.  1. Please arrive at the [facility], Entrance "A"  at [facility] at  11:30 on the day of your procedure. (The address is [address]  2. DIET: Do not eat or drink after midnight the night before your procedure.  3. LABS:  Your provider would like for you to have the following labs today: CBC and BMET  You will need to have the coronavirus test completed prior to your procedure. An appointment has been made at 2:40 pm on 08/05/19.  This is a Drive Up Visit at the [facility] [address] Please tell them that you are there for pre procedure testing. Someone will direct you to the appropriate testing line. Stay in your car and someone will be with you shortly.Please make sure to have all other labs completed before this test because you will need to stay quarantined until your procedure.   4. MEDICATIONS:  Hold the Furosemide and the Spironolactone the morning of the procedure   5.  Plan for an overnight stay.  Bring your insurance cards and a list of you medications.  6.  Wash your chest and neck with surgical scrub the evening before and the morning of your procedure.  Rinse well. Please review the surgical scrub instruction sheet given to you.   7. Your chest will need to be shaved prior to this procedure (if needed). We ask that you do this yourself at home 1 to 2 days before or if uncomfortable/unable to do yourself, then it will be performed by the hospital staff the day of.  * Special note:  Every effort is made to have your procedure done on time.  Occasionally there are emergencies that present themselves at the hospital that may cause delays.  Please be patient if a delay does occur.                                                                                                            * If you have any questions after you get home, please call Lattie Haw, RN at [phone].    [facility] - Preparing For Surgery  Before surgery, you can play an important role. Because skin is not sterile, your skin needs to be as free of germs as possible. You can reduce the number of germs on your skin by washing with CHG (chlorahexidine gluconate) Soap before surgery.  CHG is an antiseptic cleaner which kills germs and bonds with the skin to continue killing germs even after washing.   Please do not use if you have an allergy to CHG or antibacterial soaps.  If your skin becomes reddened/irritated stop using the CHG.   Do not shave (including legs and underarms) for at least 48 hours prior to first CHG shower.  It is OK to shave your face.  Please follow these instructions carefully:  1.  Shower the night before surgery  and the morning of surgery with CHG.  2.  If you choose to wash your hair, wash your hair first as usual with your normal shampoo.  3.  After you shampoo, rinse your hair and body thoroughly to remove the shampoo.  4.  Use CHG as you would any other liquid soap.  You can apply CHG directly to the skin and wash gently with a clean washcloth. 5.  Apply the CHG Soap to your body ONLY FROM THE NECK DOWN.  Do not use on open wounds or open sores.  Avoid contact with your eyes, ears, mouth and genitals (private parts).  Wash genitals (private parts) with your normal soap.  6.  Wash thoroughly, paying special attention to the area where your surgery will be performed.  7.  Thoroughly rise your body with warm water from the neck down.   8.  DO NOT shower/wash with your normal soap after using and rinsing off the CHG soap.  9.  Pat yourself dry with a clean towel.           10.  Wear clean pajamas.           11.  Place clean sheets on your bed the night of your first shower and do not sleep with pets.  Day of Surgery: Do not apply any  deodorants/lotions.  Please wear clean clothes to the hospital/surgery center.       Signed, Sanda Klein, MD  07/16/2019 6:01 PM    [facility] [place], [place], [place]  [address] Phone: [phone]; Fax: [phone]

## 2019-07-16 NOTE — H&P (View-Only) (Signed)
Patient ID: Hailey [name], female   DOB: March 19, 1946, 74 y.o.   MRN: [id number]    Cardiology Office Note    Date:  07/16/2019   ID:  Hailey [name] 1945/07/20, MRN [id number]  PCP:  Gayland Curry, DO  Cardiologist:  Thompson Grayer, MD;  Sanda Klein, MD   No chief complaint on file.   History of Present Illness:  Hailey [name] is a 74 y.o. female with longstanding persistent atrial fibrillation (s/p RF ablation procedures),s/p dual-chamber permanent pacemaker (St. Jude Accent 2012 for tachycardia-bradycardia syndrome and 2:1 atrioventricular block), CAD (nonobstructive by cath February 2019), chronic diastolic heart failure (history of acute decompensation on Multaq), multifactorial pulmonary artery hypertension, type 2 diabetes mellitus, hypertension, good lipid profile without therapy, hyperthyroidism (on propylthiouracil).  In the past she was highly symptomatic when she lost ventricular capture due to steady worsening of ventricular pacing thresholds.  For this reason her RV pacemaker lead has been reprogrammed in unipolar pacing configuration.  Her device has just reached ERI.  She was in the office today with her husband [name] and we took advantage of this to schedule her pacemaker generator change.  She will also need an RV lead revision.  She has been in persistent atrial fibrillation since January 2020, over 1 year now.  Her pacemaker can be reprogrammed VVIR.  Atrial fibrillation not appear to cause symptoms we have decided not to treat with antiarrhythmics or cardioversion.  Echocardiogram July 2020 showed normal left ventricular systolic function and EF of 60-65%, pseudonormal mitral inflow, moderately enlarged right ventricle, biatrial mild dilation, estimated systolic PA pressure 40 mmHg.  Previous right heart catheterization showed that her pulmonary hypertension could not be explained exclusively based on elevated left heart filling pressures.  She was evaluated in the  rheumatology clinic by Dr. Amil Amen since she had an elevated ANA of 1: 640, but all her other serological markers were negative for lupus, rheumatoid arthritis or other connective tissue diseases.  Chest CT has shown numerous bilateral pulmonary nodules felt to be nonspecific.  These have been present back as far as 2012.  Repeat pulmonary function tests have been planned in June, but have not been performed.  She has a large goiter and is on chronic antithyroid medications.  Past Medical History:  Diagnosis Date  . Acute on chronic diastolic CHF (congestive heart failure), NYHA class 1 ([place]) 04/29/2013  . Allergic rhinitis   . Atrial fibrillation ([place])    Pacemaker  . Brady-tachy syndrome (HCC)    s/p STJ dual chamber PPM by Dr Sallyanne Kuster  . CAD (coronary artery disease)    70% RCA stenosis, treated medically  . Control of atrial fibrillation with pacemaker ([place])   . Diabetes mellitus ([place])   . DJD (degenerative joint disease)   . Dysrhythmia    Paroxysmal Atrial Fib.  . Hypertension   . Hyperthyroidism   . Paroxysmal atrial fibrillation (HCC)   . Polio    as a child  . PONV (postoperative nausea and vomiting)   . Presence of permanent cardiac pacemaker   . Pulmonary disease     Past Surgical History:  Procedure Laterality Date  . arm surgery d/t fx Right 96  . ATRIAL FIBRILLATION ABLATION  07/31/2012; 08-10-13   PVI by Dr Rayann Heman  . ATRIAL FIBRILLATION ABLATION N/A 07/31/2012   Procedure: ATRIAL FIBRILLATION ABLATION;  Surgeon: Thompson Grayer, MD;  Location: [facility] CATH LAB;  Service: Cardiovascular;  Laterality: N/A;  . ATRIAL FIBRILLATION ABLATION N/A 08/10/2013  Procedure: ATRIAL FIBRILLATION ABLATION;  Surgeon: Coralyn Mark, MD;  Location: [facility] CATH LAB;  Service: Cardiovascular;  Laterality: N/A;  . BACK SURGERY  07/26/2012   dR. nUDELMAN  . CARDIAC CATHETERIZATION  12/03/2010   single vessel mid RCA  . CARDIAC ELECTROPHYSIOLOGY MAPPING AND ABLATION  07/31/2012   Dr. Thompson Grayer    . CESAREAN SECTION  09/20/1974   Dr. Gertie Fey  . CHOLECYSTECTOMY  1994  . COLONOSCOPY WITH PROPOFOL N/A 03/21/2015   Procedure: COLONOSCOPY WITH PROPOFOL;  Surgeon: Juanita Craver, MD;  Location: [facility] ENDOSCOPY;  Service: Endoscopy;  Laterality: N/A;  . DIAGNOSTIC MAMMOGRAM  04/16/2017   Hedwig Morton. Morris, DO  . LAPAROSCOPIC HYSTERECTOMY  Late 28s to early 80s  . LEFT HEART CATH AND CORONARY ANGIOGRAPHY N/A 07/30/2017   Procedure: LEFT HEART CATH AND CORONARY ANGIOGRAPHY;  Surgeon: Troy Sine, MD;  Location: [facility] CV LAB;  Service: Cardiovascular;  Laterality: N/A;  . LUMBAR LAMINECTOMY/DECOMPRESSION MICRODISCECTOMY Right 12/23/2012   Procedure: Right L5-S1 Laminotomy for resection of synovial cyst;  Surgeon: Hosie Spangle, MD;  Location: [facility] NEURO ORS;  Service: Neurosurgery;  Laterality: Right;  Right Lumbar five-sacral one Laminotomy for resection of synovial cyst  . NM MYOCAR PERF WALL MOTION  12/20/2010   normal  . PACEMAKER INSERTION  12/03/10   SJM Accent DR RF implanted by Dr Sallyanne Kuster  . RIGHT HEART CATH N/A 04/22/2018   Procedure: RIGHT HEART CATH;  Surgeon: Wellington Hampshire, MD;  Location: [facility] CV LAB;  Service: Cardiovascular;  Laterality: N/A;  . TEE WITHOUT CARDIOVERSION N/A 07/30/2012   Procedure: TRANSESOPHAGEAL ECHOCARDIOGRAM (TEE);  Surgeon: Sanda Klein, MD;  Location: [facility] ENDOSCOPY;  Service: Cardiovascular;  Laterality: N/A;  h&p in file-hope  . TEE WITHOUT CARDIOVERSION N/A 08/09/2013   Procedure: TRANSESOPHAGEAL ECHOCARDIOGRAM (TEE);  Surgeon: Lelon Perla, MD;  Location: [facility];  Service: Cardiovascular;  Laterality: N/A;  . TOTAL ABDOMINAL HYSTERECTOMY  1981  . ULTRASOUND GUIDANCE FOR VASCULAR ACCESS  04/22/2018   Procedure: Ultrasound Guidance For Vascular Access;  Surgeon: Wellington Hampshire, MD;  Location: [facility] CV LAB;  Service: Cardiovascular;;    Current Medications: Outpatient Medications Prior to Visit  Medication Sig Dispense Refill   . Cholecalciferol (VITAMIN D3) 1000 UNITS CAPS Take 1 capsule by mouth daily.     . Cyanocobalamin (VITAMIN B12 PO) Take 1 tablet by mouth daily as needed (for energy).     Marland Kitchen DILT-XR 180 MG 24 hr capsule Take 1 capsule by mouth once daily 90 capsule 3  . furosemide (LASIX) 80 MG tablet Take 1 tablet by mouth once daily 90 tablet 3  . glucose blood (ONETOUCH ULTRA) test strip One Touch Ultra Blue Test Strips Use daily to check blood sugars. Dx: E11.8 100 each 3  . Lancets Misc. (ONE TOUCH SURESOFT) MISC Use to test  Blood sugar once daily DX: E11.8, 1 each 0  . loperamide (IMODIUM) 2 MG capsule Take by mouth as needed for diarrhea or loose stools.    . methimazole (TAPAZOLE) 10 MG tablet TAKE 1 TABLET BY MOUTH ONCE DAILY WITH FOOD FOR THYROID 90 tablet 1  . metoprolol tartrate (LOPRESSOR) 50 MG tablet Take 1 tablet (50 mg total) by mouth 2 (two) times daily. 180 tablet 3  . oxybutynin (DITROPAN) 5 MG tablet Take 1 tablet (5 mg total) by mouth daily. 90 tablet 3  . pantoprazole (PROTONIX) 40 MG tablet Take 1 tablet by mouth once daily 90 tablet 1  .  potassium chloride SA (KLOR-CON) 20 MEQ tablet Take 1 tablet by mouth once daily 90 tablet 1  . PRADAXA 150 MG CAPS capsule Take 1 capsule by mouth twice daily 180 capsule 3  . sildenafil (REVATIO) 20 MG tablet TAKE 1 TABLET BY MOUTH THREE TIMES DAILY 90 tablet 3  . sitaGLIPtin (JANUVIA) 100 MG tablet Take 1 tablet (100 mg total) by mouth daily. 90 tablet 3  . spironolactone (ALDACTONE) 25 MG tablet Take 1 tablet (25 mg total) by mouth daily. 90 tablet 1   No facility-administered medications prior to visit.     Allergies:   Codeine, Ketorolac, Meperidine, Demerol, Dronedarone, and Cyclobenzaprine   Social History   Socioeconomic History  . Marital status: Married    Spouse name: [name]  . Number of children: 3  . Years of education: 84  . Highest education level: Some college, no degree  Occupational History  . Occupation: housewife   Tobacco Use  . Smoking status: Never Smoker  . Smokeless tobacco: Never Used  Substance and Sexual Activity  . Alcohol use: No  . Drug use: No  . Sexual activity: Not Currently    Birth control/protection: Abstinence  Other Topics Concern  . Not on file  Social History Narrative   Lives in [place].  Retired.  Was office clerk in several companies.  Did some technical school after completing high school.  She has no pets.  She and her husband live in a one-story home.  They've been married since 1965.  She follows a heart healthy diet.  She has a living will and hcpoa but does not have a DNR form.  She does struggle to prepare food.     Social Determinants of Health   Financial Resource Strain:   . Difficulty of Paying Living Expenses: Not on file  Food Insecurity:   . Worried About Charity fundraiser in the Last Year: Not on file  . Ran Out of Food in the Last Year: Not on file  Transportation Needs:   . Lack of Transportation (Medical): Not on file  . Lack of Transportation (Non-Medical): Not on file  Physical Activity:   . Days of Exercise per Week: Not on file  . Minutes of Exercise per Session: Not on file  Stress:   . Feeling of Stress : Not on file  Social Connections:   . Frequency of Communication with Friends and Family: Not on file  . Frequency of Social Gatherings with Friends and Family: Not on file  . Attends Religious Services: Not on file  . Active Member of Clubs or Organizations: Not on file  . Attends Archivist Meetings: Not on file  . Marital Status: Not on file     Family History:  The patient's family history includes Breast cancer in her sister, sister, and sister; Cancer in her brother; Colon cancer in her sister; Dementia in her brother and sister; Diabetes in her sister; Heart attack in her sister; Heart disease in her sister and sister; Prostate cancer in her brother; Suicidality in her brother.   ROS:   Please see the history of  present illness.    ROS All other systems are reviewed and are negative.   PHYSICAL EXAM:   VS:  BP 104/64      General: Alert, oriented x3, no distress, healthy left subclavian pacemaker site Head: no evidence of trauma, PERRL, EOMI, no exophtalmos or lid lag, no myxedema, no xanthelasma; normal ears, nose and oropharynx  Neck: normal jugular venous pulsations and no hepatojugular reflux; brisk carotid pulses without delay and no carotid bruits Chest: clear to auscultation, no signs of consolidation by percussion or palpation, normal fremitus, symmetrical and full respiratory excursions Cardiovascular: normal position and quality of the apical impulse, regular rhythm, normal first and second heart sounds, no murmurs, rubs or gallops Abdomen: no tenderness or distention, no masses by palpation, no abnormal pulsatility or arterial bruits, normal bowel sounds, no hepatosplenomegaly Extremities: no clubbing, cyanosis or edema; 2+ radial, ulnar and brachial pulses bilaterally; 2+ right femoral, posterior tibial and dorsalis pedis pulses; 2+ left femoral, posterior tibial and dorsalis pedis pulses; no subclavian or femoral bruits Neurological: grossly nonfocal Psych: Normal mood and affect    Wt Readings from Last 3 Encounters:  06/14/19 132 lb 9.6 oz (60.1 kg)  04/23/19 130 lb 9.6 oz (59.2 kg)  12/17/18 130 lb (59 kg)      Studies/Labs Reviewed:   Right heart catheterization April 22, 2018 1. Mildly elevated left-sided filling pressures. 2. Moderate to severe pulmonary hypertension with normal cardiac output.  Pulmonary capillary wedge pressure: 19 mmHg. PA pressure: 68/28 with a mean of 43 mmHg. Pulmonary vascular resistance: 4.9 Woods units Cardiac index was 2.97.  Recommendations: The patient seems to have mixed pulmonary hypertension due to left-sided heart failure and primary pulmonary hypertension. She is mildly volume overloaded and I suspect that she can switch to  oral furosemide tomorrow. Consider vasodilator therapy for pulmonary hypertension.  Left heart catheterization July 30, 2017   Ost 1st Diag to 1st Diag lesion is 10% stenosed.  Mid RCA lesion is 20% stenosed.  The left ventricular systolic function is normal.  LV end diastolic pressure is normal.  No significant coronary obstructive disease with mild smooth 10% narrowing in the proximal to midportion of the first diagonal branch of the LAD; normal left circumflex coronary artery; and smooth 20% mid RCA narrowing.  Normal LV function without focal segmental wall motion abnormalities. LVEDP 7.  RECOMMENDATION: Medical therapy.  Echo April 20, 2018   - Left ventricle: The cavity size was normal. Systolic function was normal. The estimated ejection fraction was in the range of 55% to 60%. Wall motion was normal; there were no regional wall motion abnormalities. There was a reduced contribution of atrial contraction to ventricular filling, due to increased ventricular diastolic pressure or atrial contractile dysfunction. Features are consistent with a pseudonormal left ventricular filling pattern, with concomitant abnormal relaxation and increased filling pressure (grade 2 diastolic dysfunction). - Left atrium: The atrium was mildly dilated. - Right ventricle: The cavity size was mildly dilated. Wall thickness was normal. - Right atrium: The atrium was mildly dilated. - Tricuspid valve: There was mild-moderate regurgitation directed centrally. - Pulmonary arteries: Systolic pressure was moderately to severely increased. PA peak pressure: 66 mm Hg (S).  Impressions:  - PA pressure has increased since the previous study.      EKG:  EKG is not ordered today.  The intracardiac electrogram shows atrial fibrillation and ventricular pacing.  Lipid Panel    Component Value Date/Time   CHOL 94 11/24/2010 0837   TRIG 51 11/24/2010 0837     HDL 53 11/24/2010 0837   CHOLHDL 1.8 11/24/2010 0837   VLDL 10 11/24/2010 0837   LDLCALC 31 11/24/2010 0837    ASSESSMENT:    1. Chronic diastolic heart failure ([place])   2. PAH (pulmonary artery hypertension) ([place])   3. Longstanding persistent atrial fibrillation ([place])   4. Long term  current use of anticoagulant   5. SSS (sick sinus syndrome) (HCC)   6. Second degree AV block   7. Pacemaker battery depletion   8. Atherosclerosis of native coronary artery of native heart without angina pectoris   9. Pre-op testing      PLAN:  In order of problems listed above:   1. CHF: Appears clinically euvolemic, NYHA functional class II. 2. PAH: Etiology likely multifactorial.  There is some contribution of diastolic heart failure.  There also is clearly a component of intrinsic arteriolar disease.  Equivocal evidence of interstitial lung disease and rheumatological disorders. 3. AFib: Asymptomatic, well rate controlled, on chronic anticoagulation.  Plan to treat as a permanent arrhythmia.   CHADSVasc at least 5 (age, gender, DM, CAD, history of CHF). 4. Anticoagulation: Denies bleeding problems. 5. SSS: Precedes diagnosis of persistent atrial fibrillation. 6. 2nd deg AVB: She felt very poorly when she lost ventricular capture.  Better after we switched the ventricular pacing configuration to unipolar. 7. PPM: This has generator change out in right ventricular lead revision.  We will plan to check patency of the left subclavian vein with a venogram.  If this looks to be okay, we will simply place a new right ventricular lead from the left subclavian.  If the left subclavian artery is occluded, we will abandon the system on the left side and place a new single-chamber pacemaker from the right. This procedure has been fully reviewed with the patient and informed consent has been obtained. 8. CAD: Denies angina pectoris.  Nonobstructive disease by cath in February 2019   Medication  Adjustments/Labs and Tests Ordered: Current medicines are reviewed at length with the patient today.  Concerns regarding medicines are outlined above.  Medication changes, Labs and Tests ordered today are listed in the Patient Instructions below. Patient Instructions  Medication Instructions: Your physician recommends that you continue on your current medications as directed. Please refer to the Current Medication list given to you today.  * If you need a refill on your cardiac medications before your next appointment, please call your pharmacy. *  Labwork: Your provider would like for you to have the following labs today: CBC and BMET  You will need to have the coronavirus test completed prior to your procedure. An appointment has been made at 2:40 pm on 08/05/19.  This is a Drive Up Visit at the [facility] [address]. Please tell them that you are there for pre procedure testing. Someone will direct you to the appropriate testing line. Stay in your car and someone will be with you shortly.Please make sure to have all other labs completed before this test because you will need to stay quarantined until your procedure.  * Will notify you of abnormal results, otherwise continue current treatment plan.*  Testing/Procedures: Your physician has recommended that you have a pacemaker/defibrillator generator change (battery change). Please follow the instructions below, located under the special instructions section.  Follow-Up: Your physician recommends that you schedule a wound check appointment 10-14 days, after your procedure on 08/09/19, with the device clinic.  Your physician recommends that you schedule a follow up appointment in 91 days, after your procedure on 08/09/19, with Dr. Sallyanne Kuster.  Thank you for choosing [facility]!!      Any Other Special Instructions Will Be Listed Below (If Applicable).    [facility]  [address] [place]  [place], [place] [address]  Phone: [phone] Fax: [phone]  Generator Change Procedure Instructions  You are scheduled for a Generator Change (battery change) on  08/09/19  with Dr. Sallyanne Kuster.  1. Please arrive at the [facility], Entrance "A"  at [facility] at  11:30 on the day of your procedure. (The address is [address]  2. DIET: Do not eat or drink after midnight the night before your procedure.  3. LABS:  Your provider would like for you to have the following labs today: CBC and BMET  You will need to have the coronavirus test completed prior to your procedure. An appointment has been made at 2:40 pm on 08/05/19.  This is a Drive Up Visit at the [facility] [address]. Please tell them that you are there for pre procedure testing. Someone will direct you to the appropriate testing line. Stay in your car and someone will be with you shortly.Please make sure to have all other labs completed before this test because you will need to stay quarantined until your procedure.   4. MEDICATIONS:  Hold the Furosemide and the Spironolactone the morning of the procedure   5.  Plan for an overnight stay.  Bring your insurance cards and a list of you medications.  6.  Wash your chest and neck with surgical scrub the evening before and the morning of your procedure.  Rinse well. Please review the surgical scrub instruction sheet given to you.   7. Your chest will need to be shaved prior to this procedure (if needed). We ask that you do this yourself at home 1 to 2 days before or if uncomfortable/unable to do yourself, then it will be performed by the hospital staff the day of.  * Special note:  Every effort is made to have your procedure done on time.  Occasionally there are emergencies that present themselves at the hospital that may cause delays.  Please be patient if a delay does occur.                                                                                                            * If you have any questions after you get home, please call Lattie Haw, RN at [phone].    [place] - Preparing For Surgery  Before surgery, you can play an important role. Because skin is not sterile, your skin needs to be as free of germs as possible. You can reduce the number of germs on your skin by washing with CHG (chlorahexidine gluconate) Soap before surgery.  CHG is an antiseptic cleaner which kills germs and bonds with the skin to continue killing germs even after washing.   Please do not use if you have an allergy to CHG or antibacterial soaps.  If your skin becomes reddened/irritated stop using the CHG.   Do not shave (including legs and underarms) for at least 48 hours prior to first CHG shower.  It is OK to shave your face.  Please follow these instructions carefully:  1.  Shower the night before surgery  and the morning of surgery with CHG.  2.  If you choose to wash your hair, wash your hair first as usual with your normal shampoo.  3.  After you shampoo, rinse your hair and body thoroughly to remove the shampoo.  4.  Use CHG as you would any other liquid soap.  You can apply CHG directly to the skin and wash gently with a clean washcloth. 5.  Apply the CHG Soap to your body ONLY FROM THE NECK DOWN.  Do not use on open wounds or open sores.  Avoid contact with your eyes, ears, mouth and genitals (private parts).  Wash genitals (private parts) with your normal soap.  6.  Wash thoroughly, paying special attention to the area where your surgery will be performed.  7.  Thoroughly rise your body with warm water from the neck down.   8.  DO NOT shower/wash with your normal soap after using and rinsing off the CHG soap.  9.  Pat yourself dry with a clean towel.           10.  Wear clean pajamas.           11.  Place clean sheets on your bed the night of your first shower and do not sleep with pets.  Day of Surgery: Do  not apply any deodorants/lotions.  Please wear clean clothes to the hospital/surgery center.       Signed, Sanda Klein, MD  07/16/2019 6:01 PM    [facility] [place], [place], [place]  [address] Phone: [phone]; Fax: [phone]

## 2019-07-19 ENCOUNTER — Encounter: Payer: Medicare Other | Admitting: Cardiovascular Disease

## 2019-07-28 ENCOUNTER — Other Ambulatory Visit: Payer: Self-pay | Admitting: Cardiovascular Disease

## 2019-07-29 ENCOUNTER — Telehealth: Payer: Self-pay | Admitting: Cardiovascular Disease

## 2019-07-29 ENCOUNTER — Ambulatory Visit (INDEPENDENT_AMBULATORY_CARE_PROVIDER_SITE_OTHER): Payer: Medicare Other | Admitting: *Deleted

## 2019-07-29 DIAGNOSIS — Z950 Presence of cardiac pacemaker: Secondary | ICD-10-CM

## 2019-07-29 LAB — CUP PACEART REMOTE DEVICE CHECK
Battery Remaining Longevity: 0 mo
Battery Voltage: 2.53 V
Brady Statistic AP VP Percent: 23 %
Brady Statistic AP VS Percent: 76 %
Brady Statistic AS VP Percent: 1 %
Brady Statistic AS VS Percent: 1 %
Brady Statistic RA Percent Paced: 44 %
Brady Statistic RV Percent Paced: 71 %
Date Time Interrogation Session: 20210225032400
Implantable Lead Implant Date: 20120702
Implantable Lead Implant Date: 20120702
Implantable Lead Location: 753859
Implantable Lead Location: 753860
Implantable Pulse Generator Implant Date: 20120702
Lead Channel Impedance Value: 280 Ohm
Lead Channel Impedance Value: 400 Ohm
Lead Channel Pacing Threshold Amplitude: 0.625 V
Lead Channel Pacing Threshold Amplitude: 0.75 V
Lead Channel Pacing Threshold Pulse Width: 0.4 ms
Lead Channel Pacing Threshold Pulse Width: 0.8 ms
Lead Channel Sensing Intrinsic Amplitude: 0.6 mV
Lead Channel Sensing Intrinsic Amplitude: 5.4 mV
Lead Channel Setting Pacing Amplitude: 1.625
Lead Channel Setting Pacing Amplitude: 2.5 V
Lead Channel Setting Pacing Pulse Width: 0.8 ms
Lead Channel Setting Sensing Sensitivity: 2 mV
Pulse Gen Model: 2210
Pulse Gen Serial Number: 7255453

## 2019-07-29 NOTE — Telephone Encounter (Signed)
FYI for Dr.C.   Thank you !

## 2019-07-29 NOTE — Progress Notes (Signed)
PPM Remote  

## 2019-07-29 NOTE — Telephone Encounter (Signed)
   Pt wanted to let Dr. Sallyanne Kuster know she got her 2nd covid vaccine today 07/29/19. She also said she will quarantine until her procedure on 08/09/19.

## 2019-07-30 NOTE — Telephone Encounter (Signed)
Thank you-the patient has verified that she will get her labs and covid test on 08/05/19.

## 2019-08-05 ENCOUNTER — Other Ambulatory Visit (HOSPITAL_COMMUNITY)
Admission: RE | Admit: 2019-08-05 | Discharge: 2019-08-05 | Disposition: A | Discharge status: Home / Self Care | Payer: Medicare Other | Source: Ambulatory Visit | Attending: Cardiovascular Disease | Admitting: Cardiovascular Disease

## 2019-08-05 DIAGNOSIS — Z20822 Contact with and (suspected) exposure to covid-19: Secondary | ICD-10-CM | POA: Diagnosis not present

## 2019-08-05 DIAGNOSIS — Z01812 Encounter for preprocedural laboratory examination: Secondary | ICD-10-CM | POA: Insufficient documentation

## 2019-08-05 DIAGNOSIS — Z01818 Encounter for other preprocedural examination: Secondary | ICD-10-CM | POA: Diagnosis not present

## 2019-08-05 DIAGNOSIS — I441 Atrioventricular block, second degree: Secondary | ICD-10-CM | POA: Diagnosis not present

## 2019-08-06 ENCOUNTER — Other Ambulatory Visit: Payer: Self-pay | Admitting: *Deleted

## 2019-08-06 DIAGNOSIS — I441 Atrioventricular block, second degree: Secondary | ICD-10-CM

## 2019-08-06 LAB — BASIC METABOLIC PANEL
BUN/Creatinine Ratio: 16 (ref 12–28)
BUN: 20 mg/dL (ref 8–27)
CO2: 22 mmol/L (ref 20–29)
Calcium: 9.5 mg/dL (ref 8.7–10.3)
Chloride: 99 mmol/L (ref 96–106)
Creatinine, Ser: 1.22 mg/dL — ABNORMAL HIGH (ref 0.57–1.00)
GFR calc Af Amer: 50 mL/min/{1.73_m2} — ABNORMAL LOW (ref >59)
GFR calc non Af Amer: 44 mL/min/{1.73_m2} — ABNORMAL LOW (ref >59)
Glucose: 122 mg/dL — ABNORMAL HIGH (ref 65–99)
Potassium: 4 mmol/L (ref 3.5–5.2)
Sodium: 138 mmol/L (ref 134–144)

## 2019-08-06 LAB — SARS CORONAVIRUS 2 (TAT 6-24 HRS): SARS Coronavirus 2: NEGATIVE

## 2019-08-06 LAB — CBC
Hematocrit: 40.8 % (ref 34.0–46.6)
Hemoglobin: 13.9 g/dL (ref 11.1–15.9)
MCH: 29.8 pg (ref 26.6–33.0)
MCHC: 34.1 g/dL (ref 31.5–35.7)
MCV: 87 fL (ref 79–97)
Platelets: 307 10*3/uL (ref 150–450)
RBC: 4.67 x10E6/uL (ref 3.77–5.28)
RDW: 12.1 % (ref 11.7–15.4)
WBC: 8.6 10*3/uL (ref 3.4–10.8)

## 2019-08-06 MED ORDER — SODIUM CHLORIDE 0.9% FLUSH: 3.0000 mL | Freq: Two times a day (BID) | INTRAVENOUS | Status: DC

## 2019-08-09 ENCOUNTER — Ambulatory Visit (HOSPITAL_COMMUNITY)
Admission: RE | Disposition: A | Discharge status: Home / Self Care | Payer: Self-pay | Source: Home / Self Care | Attending: Cardiovascular Disease

## 2019-08-09 ENCOUNTER — Ambulatory Visit (HOSPITAL_COMMUNITY)
Admission: RE | Admit: 2019-08-09 | Discharge: 2019-08-10 | Disposition: A | Discharge status: Home / Self Care | Payer: Medicare Other | Attending: Cardiovascular Disease | Admitting: Cardiovascular Disease

## 2019-08-09 ENCOUNTER — Other Ambulatory Visit: Payer: Self-pay

## 2019-08-09 DIAGNOSIS — Z7984 Long term (current) use of oral hypoglycemic drugs: Secondary | ICD-10-CM | POA: Insufficient documentation

## 2019-08-09 DIAGNOSIS — Z888 Allergy status to other drugs, medicaments and biological substances status: Secondary | ICD-10-CM | POA: Insufficient documentation

## 2019-08-09 DIAGNOSIS — Z885 Allergy status to narcotic agent status: Secondary | ICD-10-CM | POA: Diagnosis not present

## 2019-08-09 DIAGNOSIS — I110 Hypertensive heart disease with heart failure: Secondary | ICD-10-CM | POA: Diagnosis not present

## 2019-08-09 DIAGNOSIS — Z45010 Encounter for checking and testing of cardiac pacemaker pulse generator [battery]: Secondary | ICD-10-CM | POA: Diagnosis not present

## 2019-08-09 DIAGNOSIS — Z8612 Personal history of poliomyelitis: Secondary | ICD-10-CM | POA: Insufficient documentation

## 2019-08-09 DIAGNOSIS — Z833 Family history of diabetes mellitus: Secondary | ICD-10-CM | POA: Insufficient documentation

## 2019-08-09 DIAGNOSIS — E0590 Thyrotoxicosis, unspecified without thyrotoxic crisis or storm: Secondary | ICD-10-CM | POA: Diagnosis not present

## 2019-08-09 DIAGNOSIS — I2510 Atherosclerotic heart disease of native coronary artery without angina pectoris: Secondary | ICD-10-CM | POA: Insufficient documentation

## 2019-08-09 DIAGNOSIS — Z8249 Family history of ischemic heart disease and other diseases of the circulatory system: Secondary | ICD-10-CM | POA: Diagnosis not present

## 2019-08-09 DIAGNOSIS — M1990 Unspecified osteoarthritis, unspecified site: Secondary | ICD-10-CM | POA: Diagnosis not present

## 2019-08-09 DIAGNOSIS — I441 Atrioventricular block, second degree: Secondary | ICD-10-CM | POA: Diagnosis present

## 2019-08-09 DIAGNOSIS — T82110A Breakdown (mechanical) of cardiac electrode, initial encounter: Secondary | ICD-10-CM | POA: Diagnosis not present

## 2019-08-09 DIAGNOSIS — Z79899 Other long term (current) drug therapy: Secondary | ICD-10-CM | POA: Insufficient documentation

## 2019-08-09 DIAGNOSIS — I2721 Secondary pulmonary arterial hypertension: Secondary | ICD-10-CM | POA: Insufficient documentation

## 2019-08-09 DIAGNOSIS — I495 Sick sinus syndrome: Secondary | ICD-10-CM | POA: Diagnosis not present

## 2019-08-09 DIAGNOSIS — Z7901 Long term (current) use of anticoagulants: Secondary | ICD-10-CM | POA: Insufficient documentation

## 2019-08-09 DIAGNOSIS — Z950 Presence of cardiac pacemaker: Secondary | ICD-10-CM | POA: Diagnosis present

## 2019-08-09 DIAGNOSIS — I4811 Longstanding persistent atrial fibrillation: Secondary | ICD-10-CM | POA: Insufficient documentation

## 2019-08-09 DIAGNOSIS — E119 Type 2 diabetes mellitus without complications: Secondary | ICD-10-CM | POA: Diagnosis not present

## 2019-08-09 DIAGNOSIS — I5032 Chronic diastolic (congestive) heart failure: Secondary | ICD-10-CM | POA: Diagnosis not present

## 2019-08-09 HISTORY — PX: PACEMAKER IMPLANT: EP1218

## 2019-08-09 LAB — GLUCOSE, CAPILLARY: Glucose-Capillary: 124 mg/dL — ABNORMAL HIGH (ref 70–99)

## 2019-08-09 SURGERY — PACEMAKER IMPLANT

## 2019-08-09 MED ORDER — LIDOCAINE HCL (PF) 1 % IJ SOLN
INTRAMUSCULAR | Status: DC | PRN
  Administered 2019-08-09: 50 mL

## 2019-08-09 MED ORDER — HEPARIN (PORCINE) IN NACL 1000-0.9 UT/500ML-% IV SOLN
INTRAVENOUS | Status: AC
  Filled 2019-08-09: qty 500

## 2019-08-09 MED ORDER — MIDAZOLAM HCL 5 MG/5ML IJ SOLN
INTRAMUSCULAR | Status: DC | PRN
  Administered 2019-08-09 (×2): 1 mg via INTRAVENOUS

## 2019-08-09 MED ORDER — LIDOCAINE HCL 1 % IJ SOLN
INTRAMUSCULAR | Status: AC
  Filled 2019-08-09: qty 60

## 2019-08-09 MED ORDER — ONDANSETRON HCL 4 MG/2ML IJ SOLN
INTRAMUSCULAR | Status: DC | PRN
  Administered 2019-08-09: 4 mg via INTRAVENOUS

## 2019-08-09 MED ORDER — SODIUM CHLORIDE 0.9% FLUSH: 3.0000 mL | INTRAVENOUS | Status: DC | PRN

## 2019-08-09 MED ORDER — ACETAMINOPHEN 325 MG PO TABS
325.0000 mg | ORAL_TABLET | ORAL | Status: DC | PRN
  Administered 2019-08-09: 650 mg via ORAL
  Filled 2019-08-09: qty 2

## 2019-08-09 MED ORDER — DILTIAZEM HCL ER COATED BEADS 180 MG PO CP24
180.0000 mg | ORAL_CAPSULE | Freq: Every day | ORAL | Status: DC
  Administered 2019-08-10: 180 mg via ORAL
  Filled 2019-08-09 (×2): qty 1

## 2019-08-09 MED ORDER — LOPERAMIDE HCL 2 MG PO CAPS: 2.0000 mg | ORAL_CAPSULE | ORAL | Status: DC | PRN

## 2019-08-09 MED ORDER — SODIUM CHLORIDE 0.9 % IV SOLN
80.0000 mg | INTRAVENOUS | Status: AC
  Administered 2019-08-09: 80 mg
  Filled 2019-08-09: qty 2

## 2019-08-09 MED ORDER — SODIUM CHLORIDE 0.9 % IV SOLN
INTRAVENOUS | Status: AC
  Filled 2019-08-09: qty 2

## 2019-08-09 MED ORDER — SILDENAFIL CITRATE 20 MG PO TABS
20.0000 mg | ORAL_TABLET | Freq: Three times a day (TID) | ORAL | Status: DC
  Administered 2019-08-10: 20 mg via ORAL
  Filled 2019-08-09 (×3): qty 1

## 2019-08-09 MED ORDER — MIDAZOLAM HCL 5 MG/5ML IJ SOLN
INTRAMUSCULAR | Status: AC
  Filled 2019-08-09: qty 5

## 2019-08-09 MED ORDER — ONDANSETRON HCL 4 MG/2ML IJ SOLN: 4.0000 mg | Freq: Four times a day (QID) | INTRAMUSCULAR | Status: DC | PRN

## 2019-08-09 MED ORDER — POTASSIUM CHLORIDE CRYS ER 20 MEQ PO TBCR
20.0000 meq | EXTENDED_RELEASE_TABLET | Freq: Every day | ORAL | Status: DC
  Administered 2019-08-09 – 2019-08-10 (×2): 20 meq via ORAL
  Filled 2019-08-09 (×2): qty 1

## 2019-08-09 MED ORDER — SODIUM CHLORIDE 0.9 % IV SOLN: INTRAVENOUS | Status: DC

## 2019-08-09 MED ORDER — SODIUM CHLORIDE 0.9 % IV SOLN: 250.0000 mL | INTRAVENOUS | Status: DC | PRN

## 2019-08-09 MED ORDER — FENTANYL CITRATE (PF) 100 MCG/2ML IJ SOLN
INTRAMUSCULAR | Status: DC | PRN
  Administered 2019-08-09 (×2): 25 ug via INTRAVENOUS

## 2019-08-09 MED ORDER — CEFAZOLIN SODIUM-DEXTROSE 2-4 GM/100ML-% IV SOLN
INTRAVENOUS | Status: AC
  Filled 2019-08-09: qty 100

## 2019-08-09 MED ORDER — SODIUM CHLORIDE 0.9 % IV SOLN
INTRAVENOUS | Status: DC | PRN
  Administered 2019-08-09 – 2019-08-10 (×2): 250 mL via INTRAVENOUS

## 2019-08-09 MED ORDER — METOPROLOL TARTRATE 50 MG PO TABS
50.0000 mg | ORAL_TABLET | Freq: Two times a day (BID) | ORAL | Status: DC
  Administered 2019-08-10: 50 mg via ORAL
  Filled 2019-08-09: qty 1

## 2019-08-09 MED ORDER — OXYBUTYNIN CHLORIDE 5 MG PO TABS
5.0000 mg | ORAL_TABLET | Freq: Every day | ORAL | Status: DC
  Administered 2019-08-09 – 2019-08-10 (×2): 5 mg via ORAL
  Filled 2019-08-09 (×2): qty 1

## 2019-08-09 MED ORDER — ONDANSETRON HCL 4 MG/2ML IJ SOLN
INTRAMUSCULAR | Status: AC
  Filled 2019-08-09: qty 2

## 2019-08-09 MED ORDER — CHLORHEXIDINE GLUCONATE 4 % EX LIQD
4.0000 "application " | Freq: Once | CUTANEOUS | Status: DC
  Filled 2019-08-09: qty 60

## 2019-08-09 MED ORDER — YOU HAVE A PACEMAKER BOOK
Freq: Once | Status: AC
  Filled 2019-08-09: qty 1

## 2019-08-09 MED ORDER — METHIMAZOLE 10 MG PO TABS
10.0000 mg | ORAL_TABLET | Freq: Every day | ORAL | Status: DC
  Administered 2019-08-10: 10 mg via ORAL
  Filled 2019-08-09: qty 1

## 2019-08-09 MED ORDER — SPIRONOLACTONE 25 MG PO TABS
25.0000 mg | ORAL_TABLET | Freq: Every day | ORAL | Status: DC
  Administered 2019-08-09 – 2019-08-10 (×2): 25 mg via ORAL
  Filled 2019-08-09 (×2): qty 1

## 2019-08-09 MED ORDER — PANTOPRAZOLE SODIUM 40 MG PO TBEC
40.0000 mg | DELAYED_RELEASE_TABLET | Freq: Every day | ORAL | Status: DC
  Administered 2019-08-10: 40 mg via ORAL
  Filled 2019-08-09: qty 1

## 2019-08-09 MED ORDER — CEFAZOLIN SODIUM-DEXTROSE 1-4 GM/50ML-% IV SOLN
1.0000 g | Freq: Four times a day (QID) | INTRAVENOUS | Status: AC
  Administered 2019-08-09 – 2019-08-10 (×3): 1 g via INTRAVENOUS
  Filled 2019-08-09 (×3): qty 50

## 2019-08-09 MED ORDER — HEPARIN (PORCINE) IN NACL 1000-0.9 UT/500ML-% IV SOLN
INTRAVENOUS | Status: DC | PRN
  Administered 2019-08-09: 500 mL

## 2019-08-09 MED ORDER — FUROSEMIDE 80 MG PO TABS
80.0000 mg | ORAL_TABLET | Freq: Every day | ORAL | Status: DC
  Administered 2019-08-09 – 2019-08-10 (×2): 80 mg via ORAL
  Filled 2019-08-09 (×2): qty 1

## 2019-08-09 MED ORDER — SODIUM CHLORIDE 0.9% FLUSH
3.0000 mL | Freq: Two times a day (BID) | INTRAVENOUS | Status: DC
  Administered 2019-08-09 – 2019-08-10 (×2): 3 mL via INTRAVENOUS

## 2019-08-09 MED ORDER — IOHEXOL 350 MG/ML SOLN
INTRAVENOUS | Status: DC | PRN
  Administered 2019-08-09: 15 mL via INTRAVENOUS

## 2019-08-09 MED ORDER — SODIUM CHLORIDE 0.9 % IV SOLN: 250.0000 mL | INTRAVENOUS | Status: DC

## 2019-08-09 MED ORDER — FENTANYL CITRATE (PF) 100 MCG/2ML IJ SOLN
INTRAMUSCULAR | Status: AC
  Filled 2019-08-09: qty 2

## 2019-08-09 MED ORDER — CEFAZOLIN SODIUM-DEXTROSE 2-4 GM/100ML-% IV SOLN
2.0000 g | INTRAVENOUS | Status: AC
  Administered 2019-08-09: 15:00:00 2 g via INTRAVENOUS
  Filled 2019-08-09: qty 100

## 2019-08-09 SURGICAL SUPPLY — 8 items
CABLE ADAPT PACING TEMP 12FT (ADAPTER) ×1 IMPLANT
CABLE SURGICAL S-101-97-12 (CABLE) ×2 IMPLANT
GUIDEWIRE ANGLED .035X150CM (WIRE) ×1 IMPLANT
LEAD TENDRIL MRI 52CM LPA1200M (Lead) ×1 IMPLANT
PACEMAKER ASSURITY DR-RF (Pacemaker) ×1 IMPLANT
PAD PRO RADIOLUCENT 2001M-C (PAD) ×2 IMPLANT
SHEATH 8FR PRELUDE SNAP 13 (SHEATH) ×1 IMPLANT
TRAY PACEMAKER INSERTION (PACKS) ×2 IMPLANT

## 2019-08-09 NOTE — Op Note (Signed)
Procedure report  Procedure performed: 1. Replacement of dual chamber permanent pacemaker generator 2. New right ventricular lead  3. Left upper extremity venography 4. Light sedation  Reason for procedure: 1. Symptomatic bradycardia due to: - Tachycardia-bradycardia syndrome - Second degree atrioventricular block Mobitz type II 2. Right ventricular lead failure to capture  Procedure performed by: Sanda Klein, MD  Complications: None  Estimated blood loss: <10 mL  Medications administered during procedure: Ancef 2 g intravenously Lidocaine 1% 30 mL locally,  Fentanyl 50 mcg intravenously Versed 2 mg intravenously  During this procedure the patient is administered a total of Versed 2 mg and Fentanyl 50 mcg IV to achieve and maintain moderate conscious sedation.  The patient's heart rate, blood pressure, and oxygen saturation are monitored continuously during the procedure. The period of conscious sedation is 39 minutes, of which I was present face-to-face 100% of this time.   Device detailsResearch scientist (life sciences). Jude Assurity model [id number] serial number [id number] Right atrial lead (old) St. Jude Tendril STS [id number] serial number [id number] (Dec 03, 2010) Right ventricular lead (new) St. Jude Tendril MRI [id number] serial number [id number]  Abandoned/capped RV lead SJM 2088TC-52, SN [id number] (2012) Explanted generator  SJM Accent DR 2210, SN [id number]  Procedure details:  After the risks and benefits of the procedure were discussed the patient provided informed consent and was brought to the cardiac cath lab in the fasting state. Subclavian vein patency was confirmed with left antecubital venography. The patient was prepped and draped in usual sterile fashion. Local anesthesia with 1% lidocaine was administered to to the left infraclavicular area. A 5-6 cm horizontal incision was made parallel with and 2 cm cranial to the old scar. Using minimal electrocautery and mostly blunt  dissection the prepectoral pocket was opened down to the level of the pacemaker capsule. The pocket was carefully inspected for hemostasis. The old device (pacing unipolar) was left in place.  Under fluoroscopic guidance and using the modified Seldinger technique a single venipuncture was performed to access the left subclavian vein. No difficulty was encountered accessing the vein, but a glidewire was needed to pass by the old pacemaker leads.  The wire was subsequently exchanged for a 80F [place] safe sheath.  Under fluoroscopic guidance the ventricular lead was advanced to level of the mid to apical right ventricular septum and the active-fixation helix was deployed. Prominent current of injury was seen. Satisfactory pacing and sensing parameters were recorded. There was no evidence of diaphragmatic stimulation at maximum device output. The safe sheath was peeled away and the lead was secured in place with 2-0 silk.  The RV lead was connected to the new pacemaker generator. The old generator was explanted and the old atrial lead was disconnected from the old device and connected to the new one. The old RV lead was capped.  The pocket was flushed with copious amounts of antibiotic solution. Reinspection showed excellent hemostasis. Lead testing via telemetry showed stable parameters.  The entire system was then carefully inserted in the pocket with care been taking that the leads and device assumed a comfortable position without pressure on the incision. Great care was taken that the leads be located deep to the generator. The pocket was then closed in layers using 2 layers of 2-0 Vicryl and one layer of 3-0 Vicryl, after which a steristrips and a dressing was applied.  At the end of the procedure the following lead parameters were encountered:  Right atrial lead  sensed P waves 0.6 mV (afib),  impedance 450 ohms, threshold not tested.  Right ventricular lead sensed R waves 8.4 mV, impedance 790 ohms,  threshold 0.75 V at 0.5 ms pulse width.   Sanda Klein, MD, [facility] [phone] office [phone] pager

## 2019-08-09 NOTE — Discharge Instructions (Signed)

## 2019-08-09 NOTE — Interval H&P Note (Signed)
History and Physical Interval Note:  08/09/2019 11:39 AM  Hailey [name]  has presented today for surgery, with the diagnosis of second degree AV block.  The various methods of treatment have been discussed with the patient and family. After consideration of risks, benefits and other options for treatment, the patient has consented to  Procedure(s): PACEMAKER IMPLANT (N/A) as a surgical intervention.  The patient's history has been reviewed, patient examined, no change in status, stable for surgery.  I have reviewed the patient's chart and labs.  Questions were answered to the patient's satisfaction.     Hailey [name]

## 2019-08-10 ENCOUNTER — Ambulatory Visit (HOSPITAL_COMMUNITY): Payer: Medicare Other

## 2019-08-10 DIAGNOSIS — I110 Hypertensive heart disease with heart failure: Secondary | ICD-10-CM | POA: Diagnosis not present

## 2019-08-10 DIAGNOSIS — I495 Sick sinus syndrome: Secondary | ICD-10-CM | POA: Diagnosis not present

## 2019-08-10 DIAGNOSIS — E0590 Thyrotoxicosis, unspecified without thyrotoxic crisis or storm: Secondary | ICD-10-CM | POA: Diagnosis not present

## 2019-08-10 DIAGNOSIS — Z950 Presence of cardiac pacemaker: Secondary | ICD-10-CM | POA: Diagnosis not present

## 2019-08-10 DIAGNOSIS — I5032 Chronic diastolic (congestive) heart failure: Secondary | ICD-10-CM | POA: Diagnosis not present

## 2019-08-10 DIAGNOSIS — Z45010 Encounter for checking and testing of cardiac pacemaker pulse generator [battery]: Secondary | ICD-10-CM | POA: Diagnosis not present

## 2019-08-10 DIAGNOSIS — I441 Atrioventricular block, second degree: Secondary | ICD-10-CM | POA: Diagnosis not present

## 2019-08-10 MED FILL — Lidocaine HCl Local Inj 1%: INTRAMUSCULAR | Qty: 60 | Status: AC

## 2019-08-10 NOTE — Discharge Summary (Addendum)
Discharge Summary    Patient ID: Hailey [name] MRN: [id number]; DOB: [birth year]  Admit date: 08/09/2019 Discharge date: 08/10/2019  Primary Care Provider: Gayland Curry, DO  Primary Cardiologist: Sanda Klein, MD  Primary Electrophysiologist:  None   Discharge Diagnoses    Principal Problem:   Pacemaker battery depletion Active Problems:   Pacemaker St. Jude accent DR RF , July 2012   Second degree AV block   Pacemaker lead failure, initial encounter   SSS (sick sinus syndrome) [facility])   Diagnostic Studies/Procedures    Pacemaker Implant Procedure performed: 1. Replacement of dual chamber permanent pacemaker generator 2. New right ventricular lead  3. Left upper extremity venography 4. Light sedation  Reason for procedure: 1. Symptomatic bradycardia due to: - Tachycardia-bradycardia syndrome - Second degree atrioventricular block Mobitz type II 2. Right ventricular lead failure to capture  Procedure performed by: Sanda Klein, MD  CXR IMPRESSION: 1. New pacer wires in good position without complicating features. 2. No acute cardiopulmonary findings. _____________   History of Present Illness     Hailey [name] is a 74 y.o. female with pmh of persistent atrial fibrillation(status post RF ablation procedure), s/p dual-chamber permanent pacemaker ([place] 2012 for tachybradycardia-cardia syndrome and 2-1 atrioventricular block), CAD (nonobstructive by cath February 2019), chronic diastolic heart failure (history of acute decompensation on [place]), multifactorial pulmonary HTN, 2 diabetes, hypertension, good lipid profile without therapy, hypothyroidism who was scheduled for generator change.  Patient was last seen 07/13/2019 reported to be asymptomatic in A. fib.  Was scheduled for generator change on the right ventricle lead revision plan to check patency of the left subclavian vein with venogram.  If this is okay plan for new RV lead from the subclavian.   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  Hospital Course     Consultants: None  Patient was brought in on 08/09/2019 for her scheduled pacemaker generator change procedure.  Pre-procedure labs were reviewed.  The patient underwent replacement of dual-chamber permanent pacemaker generator, new right ventricle lead placement, left upper extremity venography (showed subclavian vein patency) with light sedation.  The entire system was carefully inserted in the pocket which was closed up and Steri-Strips and dressing was applied.  The procedure right atrial lead sensed P waves (0.6 mV (A. Fib), impedance 450 ohms, threshold not tested and right ventricular lead sensed R waves 8.64mV, impedance 790 ohms, threshold 0.75V at 0.5 MS pulse width.  Patient was kept overnight.  Had some urinary bladder retention which self resolved.  Next day the surgical site was not sore and looked stable without hematoma or bruising. Chest x-ray stable with lead parameters good.  Wound care and activity restriction reviewed with the patient.  She has a wound check in 2 weeks as well as a follow-up in June with Dr. Sallyanne Kuster.  Plan to resume Pradaxa tonight. No other changes were made to home medications although might consider reducing BB in the future.  The patient was evaluated by Dr. Sallyanne Kuster on 08/10/2019 and felt to be stable for discharge.  Did the patient have an acute coronary syndrome (MI, NSTEMI, STEMI, etc) this admission?:  No                               Did the patient have a percutaneous coronary intervention (stent / angioplasty)?:  No.  _____________  Discharge Vitals Blood pressure (!) 114/57, pulse 63, temperature 97.9 F (36.6 C), temperature source Axillary, resp. rate 17, height 4\' 11"  (1.499 m), weight 58.1 kg, SpO2 96 %.  Filed Weights   08/09/19 1148 08/10/19 0350  Weight: 59 kg 58.1 kg    Labs & Radiologic Studies      CBC No results for input(s): WBC, NEUTROABS, HGB, HCT, MCV, PLT in the last 72 hours. Basic Metabolic Panel No results for input(s): NA, K, CL, CO2, GLUCOSE, BUN, CREATININE, CALCIUM, MG, PHOS in the last 72 hours. Liver Function Tests No results for input(s): AST, ALT, ALKPHOS, BILITOT, PROT, ALBUMIN in the last 72 hours. No results for input(s): LIPASE, AMYLASE in the last 72 hours. High Sensitivity Troponin:   No results for input(s): TROPONINIHS in the last 720 hours.  BNP Invalid input(s): POCBNP D-Dimer No results for input(s): DDIMER in the last 72 hours. Hemoglobin A1C No results for input(s): HGBA1C in the last 72 hours. Fasting Lipid Panel No results for input(s): CHOL, HDL, LDLCALC, TRIG, CHOLHDL, LDLDIRECT in the last 72 hours. Thyroid Function Tests No results for input(s): TSH, T4TOTAL, T3FREE, THYROIDAB in the last 72 hours.  Invalid input(s): FREET3 _____________  DG Chest 2 View  Result Date: 08/10/2019 CLINICAL DATA:  Pacemaker implant. EXAM: CHEST - 2 VIEW COMPARISON:  06/08/2018 FINDINGS: New left-sided permanent pacemaker noted with 1 wire in the right atrium and 2 wires in the right ventricle. No complicating features. The cardiac silhouette, mediastinal and hilar contours are normal. The lungs are clear. Stable left-sided thyroid goiter. IMPRESSION: 1. New pacer wires in good position without complicating features. 2. No acute cardiopulmonary findings. Electronically Signed   By: Marijo Sanes M.D.   On: 08/10/2019 07:18   EP PPM/ICD IMPLANT  Result Date: 08/09/2019 Procedure performed: 1. Replacement of dual chamber permanent pacemaker generator 2. New right ventricular lead 3. Left upper extremity venography 4. Light sedation Reason for procedure: 1. Symptomatic bradycardia due to: - Tachycardia-bradycardia syndrome - Second degree atrioventricular block Mobitz type II 2. Right ventricular lead failure to capture Procedure performed by: Sanda Klein, MD  Complications: None Estimated blood loss: <10 mL Medications administered during procedure: Ancef 2 g intravenously Lidocaine 1% 30 mL locally, Fentanyl 50 mcg intravenously Versed 2 mg intravenously During this procedure the patient is administered a total of Versed 2 mg and Fentanyl 50 mcg IV to achieve and maintain moderate conscious sedation.  The patient's heart rate, blood pressure, and oxygen saturation are monitored continuously during the procedure. The period of conscious sedation is 39 minutes, of which I was present face-to-face 100% of this time. Device detailsResearch scientist (life sciences). Jude Assurity model [id number] serial number [id number] Right atrial lead (old) St. Jude Tendril STS [id number] serial number [id number] (Dec 03, 2010) Right ventricular lead (new) St. Jude Tendril MRI [id number] serial number [id number] Abandoned/capped RV lead SJM 2088TC-52, SN [id number] (2012) Explanted generator  SJM Accent DR 2210, SN [id number] Procedure details: After the risks and benefits of the procedure were discussed the patient provided informed consent and was brought to the cardiac cath lab in the fasting state. Subclavian vein patency was confirmed with left antecubital venography. The patient was prepped and draped in usual sterile fashion. Local anesthesia with 1% lidocaine was administered to to the left infraclavicular area. A 5-6 cm horizontal incision was made parallel with and 2 cm cranial to the old scar. Using minimal electrocautery and mostly blunt dissection the prepectoral pocket was opened down to the  level of the pacemaker capsule. The pocket was carefully inspected for hemostasis. The old device (pacing unipolar) was left in place. Under fluoroscopic guidance and using the modified Seldinger technique a single venipuncture was performed to access the left subclavian vein. No difficulty was encountered accessing the vein, but a glidewire was needed to pass by the old pacemaker leads.  The wire was subsequently  exchanged for a 38F [place] safe sheath. Under fluoroscopic guidance the ventricular lead was advanced to level of the mid to apical right ventricular septum and the active-fixation helix was deployed. Prominent current of injury was seen. Satisfactory pacing and sensing parameters were recorded. There was no evidence of diaphragmatic stimulation at maximum device output. The safe sheath was peeled away and the lead was secured in place with 2-0 silk. The RV lead was connected to the new pacemaker generator. The old generator was explanted and the old atrial lead was disconnected from the old device and connected to the new one. The old RV lead was capped.  The pocket was flushed with copious amounts of antibiotic solution. Reinspection showed excellent hemostasis. Lead testing via telemetry showed stable parameters. The entire system was then carefully inserted in the pocket with care been taking that the leads and device assumed a comfortable position without pressure on the incision. Great care was taken that the leads be located deep to the generator. The pocket was then closed in layers using 2 layers of 2-0 Vicryl and one layer of 3-0 Vicryl, after which a steristrips and a dressing was applied. At the end of the procedure the following lead parameters were encountered: Right atrial lead sensed P waves 0.6 mV (afib), impedance 450 ohms, threshold not tested. Right ventricular lead sensed R waves 8.4 mV, impedance 790 ohms, threshold 0.75 V at 0.5 ms pulse width.   CUP PACEART REMOTE DEVICE CHECK  Result Date: 07/29/2019 Scheduled remote reviewed.  Normal device function.  Known persistent AF, on [place].  The device reached the elective replacement indicator on 06/16/2019.  Sent to triage Next remote to be determined. Kathy Breach, RN, CCDS, [employer] Scheduled remote reviewed.  Normal device function.  Known persistent AF, on [place].  The device reached the elective replacement indicator on 06/16/2019.   Sent to triage Next remote to be determined. Kathy Breach, RN, CCDS, [employer] Scheduled for gen change on 08/09/19. ECM  Disposition   Pt is being discharged home today in good condition.  Follow-up Plans & Appointments    Follow-up Information    Croitoru, Mihai, MD Follow up on 10/15/2019.   Specialty: Cardiology Contact information: [address] [place] [address] [phone]        [place] [facility] Follow up on 08/19/2019.   Specialty: Cardiology Why: Wound check at [facility] information: [address] [place] [phone]           Discharge Medications   Allergies as of 08/10/2019      Reactions   Codeine Nausea And Vomiting, Nausea Only   Ketorolac Nausea And Vomiting, Nausea Only, Other (See Comments)   "pt told to never take this med"   Meperidine Nausea Only   Demerol Nausea And Vomiting   Dronedarone Other (See Comments)   Increased fluid retention   Cyclobenzaprine Nausea And Vomiting      Medication List    TAKE these medications   Dilt-XR 180 MG 24 hr capsule Generic drug:  diltiazem Take 1 capsule by mouth once daily What changed: how much to take   furosemide 80 MG tablet Commonly known as: LASIX Take 1 tablet by mouth once daily   loperamide 2 MG capsule Commonly known as: IMODIUM Take 2 mg by mouth as needed for diarrhea or loose stools.   methimazole 10 MG tablet Commonly known as: TAPAZOLE TAKE 1 TABLET BY MOUTH ONCE DAILY WITH FOOD FOR THYROID What changed:   how much to take  how to take this  when to take this  additional instructions   metoprolol tartrate 50 MG tablet Commonly known as: LOPRESSOR Take 1 tablet by mouth twice daily   ONE TOUCH SURESOFT Misc Use to test  Blood sugar once daily DX: E11.8,   OneTouch Ultra test strip Generic drug: glucose blood One Touch Ultra Blue Test Strips Use daily to check blood sugars. Dx:  E11.8   oxybutynin 5 MG tablet Commonly known as: DITROPAN Take 1 tablet (5 mg total) by mouth daily.   pantoprazole 40 MG tablet Commonly known as: PROTONIX Take 1 tablet by mouth once daily   potassium chloride SA 20 MEQ tablet Commonly known as: KLOR-CON Take 1 tablet by mouth once daily   Pradaxa 150 MG Caps capsule Generic drug: dabigatran Take 1 capsule by mouth twice daily What changed: how much to take   sildenafil 20 MG tablet Commonly known as: REVATIO TAKE 1 TABLET BY MOUTH THREE TIMES DAILY   sitaGLIPtin 100 MG tablet Commonly known as: JANUVIA Take 1 tablet (100 mg total) by mouth daily.   spironolactone 25 MG tablet Commonly known as: ALDACTONE Take 1 tablet by mouth once daily   VITAMIN B12 PO Take 1 tablet by mouth daily as needed (for energy).   Vitamin D3 25 MCG (1000 UT) Caps Take 1,000 Units by mouth daily.          Outstanding Labs/Studies   none  Duration of Discharge Encounter   Greater than 30 minutes including physician time.  Signed, Karnisha Lefebre Ninfa Meeker, PA-C 08/10/2019, 9:26 AM

## 2019-08-10 NOTE — Progress Notes (Signed)
Progress Note  Patient Name: Hailey [name] Date of Encounter: 08/10/2019  Primary Cardiologist: Sanda Klein, MD   Subjective   Some problems with urinary bladder retention last evening, resolved. No nausea. Surgical site is not sore. Site healthy without hematoma or oozing. CXR looks good. Lead parameters excellent.  Inpatient Medications    Scheduled Meds: . diltiazem  180 mg Oral Daily  . furosemide  80 mg Oral Daily  . methimazole  10 mg Oral Daily  . metoprolol tartrate  50 mg Oral BID  . oxybutynin  5 mg Oral Daily  . pantoprazole  40 mg Oral Daily  . potassium chloride SA  20 mEq Oral Daily  . sildenafil  20 mg Oral TID  . sodium chloride flush  3 mL Intravenous Q12H  . spironolactone  25 mg Oral Daily   Continuous Infusions: . sodium chloride    . sodium chloride Stopped (08/10/19 0351)  .  ceFAZolin (ANCEF) IV Stopped (08/10/19 0351)   PRN Meds: sodium chloride, sodium chloride, acetaminophen, loperamide, ondansetron (ZOFRAN) IV, sodium chloride flush   Vital Signs    Vitals:   08/10/19 0013 08/10/19 0314 08/10/19 0350 08/10/19 0410  BP: 99/61 113/69    Pulse: 63 63  62  Resp: 18 (!) 29  18  Temp:      TempSrc:      SpO2: 96% 92%  97%  Weight:   58.1 kg   Height:        Intake/Output Summary (Last 24 hours) at 08/10/2019 0823 Last data filed at 08/10/2019 0351 Gross per 24 hour  Intake 659.31 ml  Output 700 ml  Net -40.69 ml   Last 3 Weights 08/10/2019 08/09/2019 06/14/2019  Weight (lbs) 128 lb 130 lb 132 lb 9.6 oz  Weight (kg) 58.06 kg 58.968 kg 60.147 kg      Telemetry    AFib, rate controlled and V pacing - Personally Reviewed  ECG    AFib V paced - Personally Reviewed  Physical Exam  Comfortable. No oozing or swelling at surgical site GEN: No acute distress.   Neck: No JVD Cardiac: RRR, split S2 no murmurs, rubs, or gallops.  Respiratory: Clear to auscultation bilaterally. GI: Soft, nontender, non-distended  MS: No edema; No  deformity. Neuro:  Nonfocal  Psych: Normal affect   Labs    High Sensitivity Troponin:  No results for input(s): TROPONINIHS in the last 720 hours.    Chemistry Recent Labs  Lab 08/05/19 1416  NA 138  K 4.0  CL 99  CO2 22  GLUCOSE 122*  BUN 20  CREATININE 1.22*  CALCIUM 9.5  GFRNONAA 44*  GFRAA 50*     Hematology Recent Labs  Lab 08/05/19 1416  WBC 8.6  RBC 4.67  HGB 13.9  HCT 40.8  MCV 87  MCH 29.8  MCHC 34.1  RDW 12.1  PLT 307    BNPNo results for input(s): BNP, PROBNP in the last 168 hours.   DDimer No results for input(s): DDIMER in the last 168 hours.   Radiology    DG Chest 2 View  Result Date: 08/10/2019 CLINICAL DATA:  Pacemaker implant. EXAM: CHEST - 2 VIEW COMPARISON:  06/08/2018 FINDINGS: New left-sided permanent pacemaker noted with 1 wire in the right atrium and 2 wires in the right ventricle. No complicating features. The cardiac silhouette, mediastinal and hilar contours are normal. The lungs are clear. Stable left-sided thyroid goiter. IMPRESSION: 1. New pacer wires in good position without complicating features.  2. No acute cardiopulmonary findings. Electronically Signed   By: Marijo Sanes M.D.   On: 08/10/2019 07:18   EP PPM/ICD IMPLANT  Result Date: 08/09/2019 Procedure performed: 1. Replacement of dual chamber permanent pacemaker generator 2. New right ventricular lead 3. Left upper extremity venography 4. Light sedation Reason for procedure: 1. Symptomatic bradycardia due to: - Tachycardia-bradycardia syndrome - Second degree atrioventricular block Mobitz type II 2. Right ventricular lead failure to capture Procedure performed by: Sanda Klein, MD Complications: None Estimated blood loss: <10 mL Medications administered during procedure: Ancef 2 g intravenously Lidocaine 1% 30 mL locally, Fentanyl 50 mcg intravenously Versed 2 mg intravenously During this procedure the patient is administered a total of Versed 2 mg and Fentanyl 50 mcg IV to  achieve and maintain moderate conscious sedation.  The patient's heart rate, blood pressure, and oxygen saturation are monitored continuously during the procedure. The period of conscious sedation is 39 minutes, of which I was present face-to-face 100% of this time. Device detailsResearch scientist (life sciences). Jude Assurity model [id number] serial number [id number] Right atrial lead (old) St. Jude Tendril STS [id number] serial number [id number] (Dec 03, 2010) Right ventricular lead (new) St. Jude Tendril MRI [id number] serial number [id number] Abandoned/capped RV lead SJM [id number], SN [id number] (2012) Explanted generator  SJM Accent DR 2210, SN [id number] Procedure details: After the risks and benefits of the procedure were discussed the patient provided informed consent and was brought to the cardiac cath lab in the fasting state. Subclavian vein patency was confirmed with left antecubital venography. The patient was prepped and draped in usual sterile fashion. Local anesthesia with 1% lidocaine was administered to to the left infraclavicular area. A 5-6 cm horizontal incision was made parallel with and 2 cm cranial to the old scar. Using minimal electrocautery and mostly blunt dissection the prepectoral pocket was opened down to the level of the pacemaker capsule. The pocket was carefully inspected for hemostasis. The old device (pacing unipolar) was left in place. Under fluoroscopic guidance and using the modified Seldinger technique a single venipuncture was performed to access the left subclavian vein. No difficulty was encountered accessing the vein, but a glidewire was needed to pass by the old pacemaker leads.  The wire was subsequently exchanged for a 51F [place] safe sheath. Under fluoroscopic guidance the ventricular lead was advanced to level of the mid to apical right ventricular septum and the active-fixation helix was deployed. Prominent current of injury was seen. Satisfactory pacing and sensing parameters were recorded. There  was no evidence of diaphragmatic stimulation at maximum device output. The safe sheath was peeled away and the lead was secured in place with 2-0 silk. The RV lead was connected to the new pacemaker generator. The old generator was explanted and the old atrial lead was disconnected from the old device and connected to the new one. The old RV lead was capped.  The pocket was flushed with copious amounts of antibiotic solution. Reinspection showed excellent hemostasis. Lead testing via telemetry showed stable parameters. The entire system was then carefully inserted in the pocket with care been taking that the leads and device assumed a comfortable position without pressure on the incision. Great care was taken that the leads be located deep to the generator. The pocket was then closed in layers using 2 layers of 2-0 Vicryl and one layer of 3-0 Vicryl, after which a steristrips and a dressing was applied. At the end of the procedure the following lead parameters were encountered: Right atrial lead  sensed P waves 0.6 mV (afib), impedance 450 ohms, threshold not tested. Right ventricular lead sensed R waves 8.4 mV, impedance 790 ohms, threshold 0.75 V at 0.5 ms pulse width.    Cardiac Studies   New RV lead: threshold 0.5V@0 .5 ms, R waves >12 mV, 690 ohm. Near 100 % V paced w lower rate limit 60.  Patient Profile     74 y.o. female w tachy-brady sd and Mobitz type 2 second degree AV block, s/p dual chamber PPM, now persistent AFib, PAH, DM type 2, moderate CAD, treated hyperthyroidism, day #1 s/p generator changeout for ERI and placement of new RV lead for poor capture threshold.  Assessment & Plan    DC home. Wound care and activity restrictions reviewed. Has wound check appt in 2 weeks and F/U in June with me for device reprogramming. Resume Pradaxa tonight. May decide to reduce beta blocker dose if she continues to have this much RV pacing.  For questions or updates, please contact [place] Please consult [email or web address] for contact info under        Signed, Sanda Klein, MD  08/10/2019, 8:23 AM

## 2019-08-10 NOTE — Plan of Care (Signed)
  Problem: Education: Goal: Knowledge of cardiac device and self-care will improve Outcome: Completed/Met   Problem: Education: Goal: Ability to safely manage health related needs after discharge will improve Outcome: Completed/Met   Problem: Education: Goal: Individualized Educational Video(s) Outcome: Completed/Met   Problem: Cardiac: Goal: Ability to achieve and maintain adequate cardiopulmonary perfusion will improve Outcome: Completed/Met

## 2019-08-10 NOTE — Progress Notes (Signed)
NURSING PROGRESS NOTE  DAKOTAH WEEDA [id number] Discharge Data: 08/10/2019 11:17 AM Attending Provider: No att. providers found [id number], Tiffany L, DO     [place] to be D/C'd Home per MD order.  Discussed with the patient the After Visit Summary and all questions fully answered. All IV's discontinued with no bleeding noted. All belongings returned to patient for patient to take home. Patient taken downstairs via wheelchair accompanied by a member from volunteer services.   Last Vital Signs:  Blood pressure (!) 114/57, pulse 63, temperature 97.9 F (36.6 C), temperature source Axillary, resp. rate 17, height 4\' 11"  (1.499 m), weight 58.1 kg, SpO2 96 %.  Discharge Medication List Allergies as of 08/10/2019      Reactions   Codeine Nausea And Vomiting, Nausea Only   Ketorolac Nausea And Vomiting, Nausea Only, Other (See Comments)   "pt told to never take this med"   Meperidine Nausea Only   Demerol Nausea And Vomiting   Dronedarone Other (See Comments)   Increased fluid retention   Cyclobenzaprine Nausea And Vomiting      Medication List    TAKE these medications   Dilt-XR 180 MG 24 hr capsule Generic drug: diltiazem Take 1 capsule by mouth once daily What changed: how much to take   furosemide 80 MG tablet Commonly known as: LASIX Take 1 tablet by mouth once daily   loperamide 2 MG capsule Commonly known as: IMODIUM Take 2 mg by mouth as needed for diarrhea or loose stools.   methimazole 10 MG tablet Commonly known as: TAPAZOLE TAKE 1 TABLET BY MOUTH ONCE DAILY WITH FOOD FOR THYROID What changed:   how much to take  how to take this  when to take this  additional instructions   metoprolol tartrate 50 MG tablet Commonly known as: LOPRESSOR Take 1 tablet by mouth twice daily   ONE TOUCH SURESOFT Misc Use to test  Blood sugar once daily DX: E11.8,   OneTouch Ultra test strip Generic drug: glucose blood One Touch Ultra Blue Test Strips Use daily to check  blood sugars. Dx: E11.8   oxybutynin 5 MG tablet Commonly known as: DITROPAN Take 1 tablet (5 mg total) by mouth daily.   pantoprazole 40 MG tablet Commonly known as: PROTONIX Take 1 tablet by mouth once daily   potassium chloride SA 20 MEQ tablet Commonly known as: KLOR-CON Take 1 tablet by mouth once daily   Pradaxa 150 MG Caps capsule Generic drug: dabigatran Take 1 capsule by mouth twice daily What changed: how much to take   sildenafil 20 MG tablet Commonly known as: REVATIO TAKE 1 TABLET BY MOUTH THREE TIMES DAILY   sitaGLIPtin 100 MG tablet Commonly known as: JANUVIA Take 1 tablet (100 mg total) by mouth daily.   spironolactone 25 MG tablet Commonly known as: ALDACTONE Take 1 tablet by mouth once daily   VITAMIN B12 PO Take 1 tablet by mouth daily as needed (for energy).   Vitamin D3 25 MCG (1000 UT) Caps Take 1,000 Units by mouth daily.

## 2019-08-19 ENCOUNTER — Ambulatory Visit: Payer: Medicare Other

## 2019-08-19 ENCOUNTER — Telehealth: Payer: Self-pay

## 2019-08-19 ENCOUNTER — Telehealth: Payer: Self-pay | Admitting: Emergency Medicine

## 2019-08-19 NOTE — Telephone Encounter (Signed)
Patient contacted due to alert for persistent episode of AF with controlled v-rates since August 10, 2019. AT/Af burden 99%. Patient is on Pradaxa and is taking meds as directed. Reports she has been tired since lead revision on 08/09/19. No SOB , no CP, no dizziness, no pedal edema reported. Patient rescheduled her wound check in device clinic from today to 08/24/19 due to concern over weather reports for this afternoon. HX AF. ED precautions given for CP, SOB, syncope or worsening cardiac symptoms.

## 2019-08-19 NOTE — Telephone Encounter (Signed)
She has longstanding persistent atrial fibrillation, without plans for cardioversion. Dual chamber, but should be programmed VVIR and AFib alerts off. Rate response sensor may need to be adjusted.

## 2019-08-19 NOTE — Telephone Encounter (Signed)
Incoming call received from patients daughter Hailey [name] stating she has some paperwork for Dr.Reed to complete that will remove the responsibility of patient pulling trash cans to the road which puts her and husband (also a patient of Dr.Reed's) at risk for falling.  Hailey [name] was informed she can submit paperwork by uploading to [employer], faxing, dropping off at our office, or mailing.  Hailey [name] asked for the fax number. Hailey [name] was informed that Dr.Reed will review paperwork, complete, and if an appointment is needed prior to completing paperwork we will call to scheduled. Hailey [name] verbalized understanding of process.    This message will be copied and pasted to Hailey [name]'s chart as well for the paperwork applies to both patient.

## 2019-08-21 ENCOUNTER — Telehealth: Payer: Self-pay | Admitting: *Deleted

## 2019-08-21 NOTE — Telephone Encounter (Signed)
Prior authorization for Sildenafil submitted through CoverMyMeds. Key: [id number]

## 2019-08-23 NOTE — Telephone Encounter (Signed)
Approved through 08/20/2020

## 2019-08-24 ENCOUNTER — Ambulatory Visit (INDEPENDENT_AMBULATORY_CARE_PROVIDER_SITE_OTHER): Payer: Medicare Other | Admitting: *Deleted

## 2019-08-24 ENCOUNTER — Other Ambulatory Visit: Payer: Self-pay

## 2019-08-24 DIAGNOSIS — I441 Atrioventricular block, second degree: Secondary | ICD-10-CM | POA: Diagnosis not present

## 2019-08-24 DIAGNOSIS — Z950 Presence of cardiac pacemaker: Secondary | ICD-10-CM

## 2019-08-24 DIAGNOSIS — I5032 Chronic diastolic (congestive) heart failure: Secondary | ICD-10-CM | POA: Diagnosis not present

## 2019-08-24 LAB — CUP PACEART INCLINIC DEVICE CHECK
Battery Remaining Longevity: 144 mo
Battery Voltage: 3.05 V
Brady Statistic RA Percent Paced: 0.28 %
Brady Statistic RV Percent Paced: 99.51 %
Date Time Interrogation Session: 20210323103100
Implantable Lead Implant Date: 20120702
Implantable Lead Implant Date: 20120702
Implantable Lead Implant Date: 20210308
Implantable Lead Location: 753859
Implantable Lead Location: 753860
Implantable Lead Location: 753860
Implantable Pulse Generator Implant Date: 20210308
Lead Channel Impedance Value: 437.5 Ohm
Lead Channel Impedance Value: 737.5 Ohm
Lead Channel Pacing Threshold Amplitude: 0.5 V
Lead Channel Pacing Threshold Pulse Width: 0.5 ms
Lead Channel Sensing Intrinsic Amplitude: 0.5 mV
Lead Channel Sensing Intrinsic Amplitude: 12 mV
Lead Channel Setting Pacing Amplitude: 0.75 V
Lead Channel Setting Pacing Amplitude: 2.5 V
Lead Channel Setting Pacing Pulse Width: 0.5 ms
Lead Channel Setting Sensing Sensitivity: 2 mV
Pulse Gen Model: 2272
Pulse Gen Serial Number: 3801383

## 2019-08-24 NOTE — Patient Instructions (Signed)
Call office if you have any drainage, redness, or increased swelling at the wound site. Call the office if you have fever or chills.

## 2019-08-24 NOTE — Progress Notes (Signed)
Wound check appointment. Steri-strips removed. Wound without redness or edema. Incision edges approximated, wound well healed. Normal device function. Threshold (ventricle), sensing, and impedances consistent with implant measurements. Unable to measure atrial threshold d/t AF. Device programmed at 3.5V/auto capture programmed on for extra safety margin until 3 month visit. Histogram distribution appropriate for patient and level of activity. 1 mode switches, AT/AF burden > 99%, + pradaxa. No high ventricular rates noted. Patient educated about wound care, arm mobility, lifting restrictions. ROV in 3 months with implanting physician. Next remote 11/10/19.

## 2019-10-11 ENCOUNTER — Other Ambulatory Visit: Payer: Medicare Other

## 2019-10-14 ENCOUNTER — Ambulatory Visit: Payer: Medicare Other | Admitting: Internal Medicine

## 2019-10-18 ENCOUNTER — Other Ambulatory Visit: Payer: Self-pay | Admitting: Cardiovascular Disease

## 2019-10-18 ENCOUNTER — Other Ambulatory Visit: Payer: Self-pay | Admitting: Internal Medicine

## 2019-10-21 ENCOUNTER — Telehealth: Payer: Self-pay

## 2019-10-21 NOTE — Telephone Encounter (Signed)
Should they take meds before coming to fasting labs on November 11 2019. Please advise

## 2019-10-21 NOTE — Telephone Encounter (Signed)
Thyroid medication Methimazole has to be taken with food.You can wait to take after getting labs drawn.You can take the rest of the medication or wait until after the fasting labs.

## 2019-10-21 NOTE — Telephone Encounter (Signed)
Called patient , I left a message on the VM

## 2019-11-09 LAB — CUP PACEART REMOTE DEVICE CHECK
Battery Remaining Longevity: 128 mo
Battery Remaining Percentage: 95.5 %
Battery Voltage: 3.02 V
Brady Statistic AP VP Percent: 0 %
Brady Statistic AP VS Percent: 0 %
Brady Statistic AS VP Percent: 0 %
Brady Statistic AS VS Percent: 0 %
Brady Statistic RA Percent Paced: 1 %
Brady Statistic RV Percent Paced: 99 %
Date Time Interrogation Session: 20210608020013
Implantable Lead Implant Date: 20120702
Implantable Lead Implant Date: 20120702
Implantable Lead Implant Date: 20210308
Implantable Lead Location: 753859
Implantable Lead Location: 753860
Implantable Lead Location: 753860
Implantable Pulse Generator Implant Date: 20210308
Lead Channel Impedance Value: 430 Ohm
Lead Channel Impedance Value: 660 Ohm
Lead Channel Pacing Threshold Amplitude: 0.5 V
Lead Channel Pacing Threshold Pulse Width: 0.5 ms
Lead Channel Sensing Intrinsic Amplitude: 0.5 mV
Lead Channel Sensing Intrinsic Amplitude: 12 mV
Lead Channel Setting Pacing Amplitude: 0.75 V
Lead Channel Setting Pacing Amplitude: 2.5 V
Lead Channel Setting Pacing Pulse Width: 0.5 ms
Lead Channel Setting Sensing Sensitivity: 2 mV
Pulse Gen Model: 2272
Pulse Gen Serial Number: 3801383

## 2019-11-10 ENCOUNTER — Ambulatory Visit (INDEPENDENT_AMBULATORY_CARE_PROVIDER_SITE_OTHER): Payer: Medicare Other | Admitting: *Deleted

## 2019-11-10 DIAGNOSIS — I441 Atrioventricular block, second degree: Secondary | ICD-10-CM

## 2019-11-10 DIAGNOSIS — I495 Sick sinus syndrome: Secondary | ICD-10-CM

## 2019-11-11 ENCOUNTER — Other Ambulatory Visit: Payer: Medicare Other

## 2019-11-11 NOTE — Progress Notes (Signed)
Remote pacemaker transmission.   

## 2019-11-15 ENCOUNTER — Encounter: Payer: Self-pay | Admitting: Cardiovascular Disease

## 2019-11-15 ENCOUNTER — Other Ambulatory Visit: Payer: Self-pay

## 2019-11-15 ENCOUNTER — Ambulatory Visit (INDEPENDENT_AMBULATORY_CARE_PROVIDER_SITE_OTHER): Payer: Medicare Other | Admitting: Cardiovascular Disease

## 2019-11-15 VITALS — BP 100/54 | HR 61 | Ht 59.0 in | Wt 130.0 lb

## 2019-11-15 DIAGNOSIS — Z7901 Long term (current) use of anticoagulants: Secondary | ICD-10-CM | POA: Diagnosis not present

## 2019-11-15 DIAGNOSIS — I4811 Longstanding persistent atrial fibrillation: Secondary | ICD-10-CM | POA: Diagnosis not present

## 2019-11-15 DIAGNOSIS — I25119 Atherosclerotic heart disease of native coronary artery with unspecified angina pectoris: Secondary | ICD-10-CM | POA: Diagnosis not present

## 2019-11-15 DIAGNOSIS — I2510 Atherosclerotic heart disease of native coronary artery without angina pectoris: Secondary | ICD-10-CM

## 2019-11-15 DIAGNOSIS — I2721 Secondary pulmonary arterial hypertension: Secondary | ICD-10-CM

## 2019-11-15 DIAGNOSIS — I5032 Chronic diastolic (congestive) heart failure: Secondary | ICD-10-CM | POA: Diagnosis not present

## 2019-11-15 DIAGNOSIS — Z950 Presence of cardiac pacemaker: Secondary | ICD-10-CM

## 2019-11-15 DIAGNOSIS — I441 Atrioventricular block, second degree: Secondary | ICD-10-CM

## 2019-11-15 DIAGNOSIS — I495 Sick sinus syndrome: Secondary | ICD-10-CM | POA: Diagnosis not present

## 2019-11-15 LAB — PACEMAKER DEVICE OBSERVATION

## 2019-11-15 NOTE — Patient Instructions (Signed)
Medication Instructions:  No changes If you need a refill on your cardiac medications before your next appointment, please call your pharmacy.   Lab work: None ordered If you have labs (blood work) drawn today and your tests are completely normal, you will receive your results only by: [place] (if you have MyChart) OR A paper copy in the mail If you have any lab test that is abnormal or we need to change your treatment, we will call you to review the results.  Testing/Procedures: None ordered  Follow-Up: At [facility], you and your health needs are our priority.  As part of our continuing mission to provide you with exceptional heart care, we have created designated Provider Care Teams.  These Care Teams include your primary Cardiologist (physician) and Advanced Practice Providers (APPs -  Physician Assistants and Nurse Practitioners) who all work together to provide you with the care you need, when you need it.  We recommend signing up for the patient portal called "MyChart".  Sign up information is provided on this After Visit Summary.  MyChart is used to connect with patients for Virtual Visits (Telemedicine).  Patients are able to view lab/test results, encounter notes, upcoming appointments, etc.  Non-urgent messages can be sent to your provider as well.   To learn more about what you can do with MyChart, go to [email or web address].    Your next appointment:   6 month(s)  The format for your next appointment:   In Person  Provider:   Sanda Klein, MD   Remote monitoring is used to monitor your pacemaker from home. This monitoring reduces the number of office visits required to check your device to one time per year. It allows [place] to keep an eye on the functioning of your device to ensure it is working properly. You are scheduled for a device check from home on 02/09/20. You may send your transmission at any time that day. If you have a wireless device, the  transmission will be sent automatically.

## 2019-11-15 NOTE — Progress Notes (Signed)
Patient ID: Hailey [name], female   DOB: 01-09-1946, 74 y.o.   MRN: [id number]    Cardiology Office Note    Date:  11/15/2019   ID:  Hailey, [name] 07-05-1945, MRN [id number]  PCP:  Gayland Curry, DO  Cardiologist:  Thompson Grayer, MD;  Sanda Klein, MD   Chief Complaint  Patient presents with   Follow-up  Atrial fibrillation  History of Present Illness:  Hailey [name] is a 74 y.o. female with longstanding persistent atrial fibrillation (s/p RF ablation procedures),s/p dual-chamber permanent pacemaker ([employer] Accent 2012 for tachycardia-bradycardia syndrome and 2:1 atrioventricular block), CAD (nonobstructive by cath February 2019), chronic diastolic heart failure (history of acute decompensation on Multaq), multifactorial pulmonary artery hypertension, type 2 diabetes mellitus, hypertension, good lipid profile without therapy, hyperthyroidism (on propylthiouracil).  She returns in follow-up roughly 3 months after pacemaker generator change out and placement of a new right ventricular pacing lead.  She has done well since that time.  She has been in uninterrupted atrial fibrillation now for over 16 months.  She remains unaware of the arrhythmia and there has been no meaningful change in functional status since she developed persistent atrial fibrillation last spring.  She has NYHA functional class I-2 status, she denies orthopnea, PND, leg edema.  She has not had any falls, injuries or syncope or near syncope.  The pacemaker site is well-healed.  She denies fever or chills.  She has not had cough, wheezing or hemoptysis.  Her blood pressure tends to be consistently low with a systolic right around 381 mmHg, but she does not have dizziness or lightheadedness.  Pacemaker interrogation shows normal device function.  Presenting rhythm is atrial fibrillation with 100% ventricular paced rhythm.  Heart rate histogram distribution is appropriate.  She has not had any episodes of high  ventricular rate.  The device was reprogrammed VVIR today which increase the battery estimated life span to 11.5-12.5 years.  She is not pacemaker dependent.  There is underlying atrial fibrillation with slow ventricular response with ventricular rates in the 40-45 bpm range.  Paced QRS is relatively narrow at 126 ms.  QTc 469 ms  Echocardiogram July 2020 showed normal left ventricular systolic function and EF of 60-65%, pseudonormal mitral inflow, moderately enlarged right ventricle, biatrial mild dilation, estimated systolic PA pressure 40 mmHg.  Previous right heart catheterization showed that her pulmonary hypertension could not be explained exclusively based on elevated left heart filling pressures.  She was evaluated in the rheumatology clinic by Dr. Amil Amen since she had an elevated ANA of 1: 640, but all her other serological markers were negative for lupus, rheumatoid arthritis or other connective tissue diseases.  Chest CT has shown numerous bilateral pulmonary nodules felt to be nonspecific.  These have been present back as far as 2012.  Repeat pulmonary function tests have been planned in June, but have not been performed.  She has a large goiter and is on chronic antithyroid medications.  Past Medical History:  Diagnosis Date   Acute on chronic diastolic CHF (congestive heart failure), NYHA class 1 (HCC) 04/29/2013   Allergic rhinitis    Atrial fibrillation (HCC)    Pacemaker   Brady-tachy syndrome (HCC)    s/p STJ dual chamber PPM by Dr Sallyanne Kuster   CAD (coronary artery disease)    70% RCA stenosis, treated medically   Control of atrial fibrillation with pacemaker ([place])    Diabetes mellitus ([place])    DJD (degenerative joint disease)  Dysrhythmia    Paroxysmal Atrial Fib.   Hypertension    Hyperthyroidism    Paroxysmal atrial fibrillation (HCC)    Polio    as a child   PONV (postoperative nausea and vomiting)    Presence of permanent cardiac pacemaker     Pulmonary disease     Past Surgical History:  Procedure Laterality Date   arm surgery d/t fx Right 51   ATRIAL FIBRILLATION ABLATION  07/31/2012; 08-10-13   PVI by Dr Rayann Heman   ATRIAL FIBRILLATION ABLATION N/A 07/31/2012   Procedure: ATRIAL FIBRILLATION ABLATION;  Surgeon: Thompson Grayer, MD;  [facility] CATH LAB;  Service: Cardiovascular;  Laterality: N/A;   ATRIAL FIBRILLATION ABLATION N/A 08/10/2013   Procedure: ATRIAL FIBRILLATION ABLATION;  Surgeon: Coralyn Mark, MD;  Location: [place] CATH LAB;  Service: Cardiovascular;  Laterality: N/A;   BACK SURGERY  07/26/2012   dR. [facility]   CARDIAC CATHETERIZATION  12/03/2010   single vessel mid RCA   CARDIAC ELECTROPHYSIOLOGY MAPPING AND ABLATION  07/31/2012   Dr. Thompson Grayer   CESAREAN SECTION  09/20/1974   Dr. Gertie Fey   CHOLECYSTECTOMY  1994   COLONOSCOPY WITH PROPOFOL N/A 03/21/2015   Procedure: COLONOSCOPY WITH PROPOFOL;  Surgeon: Juanita Craver, MD;  Location: [facility] ENDOSCOPY;  Service: Endoscopy;  Laterality: N/A;   DIAGNOSTIC MAMMOGRAM  04/16/2017   Hedwig Morton. Morris, DO   LAPAROSCOPIC HYSTERECTOMY  Late 57s to early 63s   LEFT HEART CATH AND CORONARY ANGIOGRAPHY N/A 07/30/2017   Procedure: LEFT HEART CATH AND CORONARY ANGIOGRAPHY;  Surgeon: Troy Sine, MD;  Location: [place] CV LAB;  Service: Cardiovascular;  Laterality: N/A;   LUMBAR LAMINECTOMY/DECOMPRESSION MICRODISCECTOMY Right 12/23/2012   Procedure: Right L5-S1 Laminotomy for resection of synovial cyst;  Surgeon: Hosie Spangle, MD;  Location: [place] NEURO ORS;  Service: Neurosurgery;  Laterality: Right;  Right Lumbar five-sacral one Laminotomy for resection of synovial cyst   NM MYOCAR PERF WALL MOTION  12/20/2010   normal   PACEMAKER IMPLANT N/A 08/09/2019   Procedure: PACEMAKER IMPLANT;  Surgeon: Sanda Klein, MD;  Location: [place] CV LAB;  Service: Cardiovascular;  Laterality: N/A;   PACEMAKER INSERTION  12/03/10   SJM Accent DR RF implanted by Dr Sallyanne Kuster    RIGHT HEART CATH N/A 04/22/2018   Procedure: RIGHT HEART CATH;  Surgeon: Wellington Hampshire, MD;  Location: [place] CV LAB;  Service: Cardiovascular;  Laterality: N/A;   TEE WITHOUT CARDIOVERSION N/A 07/30/2012   Procedure: TRANSESOPHAGEAL ECHOCARDIOGRAM (TEE);  Surgeon: Sanda Klein, MD;  Location: [facility] ENDOSCOPY;  Service: Cardiovascular;  Laterality: N/A;  h&p in file-hope   TEE WITHOUT CARDIOVERSION N/A 08/09/2013   Procedure: TRANSESOPHAGEAL ECHOCARDIOGRAM (TEE);  Surgeon: Lelon Perla, MD;  Location: [facility] ENDOSCOPY;  Service: Cardiovascular;  Laterality: N/A;   TOTAL ABDOMINAL HYSTERECTOMY  1981   ULTRASOUND GUIDANCE FOR VASCULAR ACCESS  04/22/2018   Procedure: Ultrasound Guidance For Vascular Access;  Surgeon: Wellington Hampshire, MD;  Location: [place] CV LAB;  Service: Cardiovascular;;    Current Medications: Outpatient Medications Prior to Visit  Medication Sig Dispense Refill   Cholecalciferol (VITAMIN D3) 1000 UNITS CAPS Take 1,000 Units by mouth daily.      Cyanocobalamin (VITAMIN B12 PO) Take 1 tablet by mouth daily as needed (for energy).      DILT-XR 180 MG 24 hr capsule Take 1 capsule by mouth once daily (Patient taking differently: Take 180 mg by mouth daily. ) 90 capsule 3   furosemide (LASIX) 80  MG tablet Take 1 tablet by mouth once daily (Patient taking differently: Take 80 mg by mouth daily. ) 90 tablet 3   glucose blood (ONETOUCH ULTRA) test strip One Touch Ultra Blue Test Strips Use daily to check blood sugars. Dx: E11.8 100 each 3   Lancets Misc. (ONE TOUCH SURESOFT) MISC Use to test  Blood sugar once daily DX: E11.8, 1 each 0   loperamide (IMODIUM) 2 MG capsule Take 2 mg by mouth as needed for diarrhea or loose stools.      methimazole (TAPAZOLE) 10 MG tablet TAKE 1 TABLET BY MOUTH ONCE DAILY WITH FOOD FOR THYROID 90 tablet 0   metoprolol tartrate (LOPRESSOR) 50 MG tablet Take 1 tablet by mouth twice daily 180 tablet 3   oxybutynin (DITROPAN) 5 MG  tablet Take 1 tablet (5 mg total) by mouth daily. 90 tablet 3   pantoprazole (PROTONIX) 40 MG tablet Take 1 tablet by mouth once daily (Patient taking differently: Take 40 mg by mouth daily. ) 90 tablet 1   potassium chloride SA (KLOR-CON) 20 MEQ tablet Take 1 tablet by mouth once daily (Patient taking differently: Take 20 mEq by mouth daily. ) 90 tablet 1   PRADAXA 150 MG CAPS capsule Take 1 capsule by mouth twice daily (Patient taking differently: Take 150 mg by mouth 2 (two) times daily. ) 180 capsule 3   sildenafil (REVATIO) 20 MG tablet TAKE 1 TABLET BY MOUTH THREE TIMES DAILY (Patient taking differently: Take 20 mg by mouth 3 (three) times daily. ) 90 tablet 3   sitaGLIPtin (JANUVIA) 100 MG tablet Take 1 tablet (100 mg total) by mouth daily. 90 tablet 3   spironolactone (ALDACTONE) 25 MG tablet Take 1 tablet by mouth once daily (Patient taking differently: Take 25 mg by mouth daily. ) 90 tablet 1   Facility-Administered Medications Prior to Visit  Medication Dose Route Frequency Provider Last Rate Last Admin   sodium chloride flush (NS) 0.9 % injection 3 mL  3 mL Intravenous Q12H Kailan Laws, MD         Allergies:   Codeine, Ketorolac, Meperidine, Demerol, Dronedarone, and Cyclobenzaprine   Social History   Socioeconomic History   Marital status: Married    Spouse name: [name]   Number of children: 3   Years of education: 13   Highest education level: Some college, no degree  Occupational History   Occupation: housewife  Tobacco Use   Smoking status: Never Smoker   Smokeless tobacco: Never Used  Scientific laboratory technician Use: Never used  Substance and Sexual Activity   Alcohol use: No   Drug use: No   Sexual activity: Not Currently    Birth control/protection: Abstinence  Other Topics Concern   Not on file  Social History Narrative   Lives in [place].  Retired.  Was office clerk in several companies.  Did some technical school after completing high  school.  She has no pets.  She and her husband live in a one-story home.  They've been married since 1965.  She follows a heart healthy diet.  She has a living will and hcpoa but does not have a DNR form.  She does struggle to prepare food.     Social Determinants of Health   Financial Resource Strain:    Difficulty of Paying Living Expenses:   Food Insecurity:    Worried About Charity fundraiser in the Last Year:    Ran Out of Food in the Last  Year:   Transportation Needs:    Film/video editor (Medical):    Lack of Transportation (Non-Medical):   Physical Activity:    Days of Exercise per Week:    Minutes of Exercise per Session:   Stress:    Feeling of Stress :   Social Connections:    Frequency of Communication with Friends and Family:    Frequency of Social Gatherings with Friends and Family:    Attends Religious Services:    Active Member of Clubs or Organizations:    Attends Music therapist:    Marital Status:      Family History:  The patient's family history includes Breast cancer in her sister, sister, and sister; Cancer in her brother; Colon cancer in her sister; Dementia in her brother and sister; Diabetes in her sister; Heart attack in her sister; Heart disease in her sister and sister; Prostate cancer in her brother; Suicidality in her brother.   ROS:   Please see the history of present illness.    All other systems are reviewed and are negative.   PHYSICAL EXAM:   VS:  BP (!) 100/54 (BP Location: Right Arm, Patient Position: Sitting, Cuff Size: Normal)    Pulse 61    Ht 4\' 11"  (1.499 m)    Wt 130 lb (59 kg)    BMI 26.26 kg/m      General: Alert, oriented x3, no distress, well-healed left subclavian pacemaker site Head: no evidence of trauma, PERRL, EOMI, no exophtalmos or lid lag, no myxedema, no xanthelasma; normal ears, nose and oropharynx Neck: normal jugular venous pulsations and no hepatojugular reflux; brisk carotid pulses  without delay and no carotid bruits Chest: clear to auscultation, no signs of consolidation by percussion or palpation, normal fremitus, symmetrical and full respiratory excursions Cardiovascular: normal position and quality of the apical impulse, regular rhythm, normal first and paradoxically split second heart sounds, no murmurs, rubs or gallops Abdomen: no tenderness or distention, no masses by palpation, no abnormal pulsatility or arterial bruits, normal bowel sounds, no hepatosplenomegaly Extremities: no clubbing, cyanosis or edema; 2+ radial, ulnar and brachial pulses bilaterally; 2+ right femoral, posterior tibial and dorsalis pedis pulses; 2+ left femoral, posterior tibial and dorsalis pedis pulses; no subclavian or femoral bruits Neurological: grossly nonfocal Psych: Normal mood and affect   Wt Readings from Last 3 Encounters:  11/15/19 130 lb (59 kg)  08/10/19 128 lb (58.1 kg)  06/14/19 132 lb 9.6 oz (60.1 kg)      Studies/Labs Reviewed:   Right heart catheterization April 22, 2018 1. Mildly elevated left-sided filling pressures. 2. Moderate to severe pulmonary hypertension with normal cardiac output.  Pulmonary capillary wedge pressure: 19 mmHg. PA pressure: 68/28 with a mean of 43 mmHg. Pulmonary vascular resistance: 4.9 Woods units Cardiac index was 2.97.  Recommendations: The patient seems to have mixed pulmonary hypertension due to left-sided heart failure and primary pulmonary hypertension. She is mildly volume overloaded and I suspect that she can switch to oral furosemide tomorrow. Consider vasodilator therapy for pulmonary hypertension.  Left heart catheterization July 30, 2017   Ost 1st Diag to 1st Diag lesion is 10% stenosed.  Mid RCA lesion is 20% stenosed.  The left ventricular systolic function is normal.  LV end diastolic pressure is normal.  No significant coronary obstructive disease with mild smooth 10% narrowing in the proximal to  midportion of the first diagonal branch of the LAD; normal left circumflex coronary artery; and smooth 20% mid RCA narrowing.  Normal LV function without focal segmental wall motion abnormalities. LVEDP 7.  RECOMMENDATION: Medical therapy.  Echo April 20, 2018   - Left ventricle: The cavity size was normal. Systolic function was normal. The estimated ejection fraction was in the range of 55% to 60%. Wall motion was normal; there were no regional wall motion abnormalities. There was a reduced contribution of atrial contraction to ventricular filling, due to increased ventricular diastolic pressure or atrial contractile dysfunction. Features are consistent with a pseudonormal left ventricular filling pattern, with concomitant abnormal relaxation and increased filling pressure (grade 2 diastolic dysfunction). - Left atrium: The atrium was mildly dilated. - Right ventricle: The cavity size was mildly dilated. Wall thickness was normal. - Right atrium: The atrium was mildly dilated. - Tricuspid valve: There was mild-moderate regurgitation directed centrally. - Pulmonary arteries: Systolic pressure was moderately to severely increased. PA peak pressure: 66 mm Hg (S).  Impressions:  - PA pressure has increased since the previous study.      EKG:  EKG is ordered today and shows atrial fibrillation with ventricular paced rhythm. Paced QRS is relatively narrow at 126 ms.  QTc 469 ms  Lipid Panel    Component Value Date/Time   CHOL 94 11/24/2010 0837   TRIG 51 11/24/2010 0837   HDL 53 11/24/2010 0837   CHOLHDL 1.8 11/24/2010 0837   VLDL 10 11/24/2010 0837   LDLCALC 31 11/24/2010 0837    ASSESSMENT:    1. Chronic diastolic heart failure ([place])   2. PAH (pulmonary artery hypertension) ([place])   3. Longstanding persistent atrial fibrillation ([place])   4. Long term current use of anticoagulant   5. SSS (sick sinus syndrome) (HCC)   6. Second degree  AV block   7. Pacemaker   8. Coronary artery disease involving native coronary artery of native heart without angina pectoris      PLAN:  In order of problems listed above:   1. CHF: No clinical signs of hypervolemia.  NYHA functional class I-2 2. PAH: Etiology likely multifactorial.  At least in part due to diastolic left heart failure.  There also is clearly a component of intrinsic arteriolar disease.  Equivocal evidence of interstitial lung disease and rheumatological disorders.  Seems to be doing well clinically on sildenafil. 3. AFib: This is now a permanent arrhythmia.  She is oblivious to the rhythm abnormality.  Well rate controlled (in fact she has 100% ventricular paced rhythm).  We will stop the diltiazem.   CHADSVasc at least 5 (age, gender, DM, CAD, history of CHF). 4. Anticoagulation: Well-tolerated without bleeding problems. 5. SSS: Precedes diagnosis of persistent atrial fibrillation and was the initial reason for device implantation.. 6. 2nd deg AVB: She is not pacemaker dependent, but feels very poorly when she loses ventricular capture.  As a new RV lead. 7. PPM: Good right ventricular new lead pacing threshold and sensing.  Remote downloads every 3 months. 8. CAD: Asymptomatic/no angina pectoris.  Nonobstructive disease by cath in February 2019.  Need to get updated lipid profile.   Medication Adjustments/Labs and Tests Ordered: Current medicines are reviewed at length with the patient today.  Concerns regarding medicines are outlined above.  Medication changes, Labs and Tests ordered today are listed in the Patient Instructions below.  STOP diltiazem Patient Instructions  Medication Instructions:  No changes If you need a refill on your cardiac medications before your next appointment, please call your pharmacy.   Lab work: None ordered If you have labs (blood work) drawn  today and your tests are completely normal, you will receive your results only by: MyChart  Message (if you have MyChart) OR A paper copy in the mail If you have any lab test that is abnormal or we need to change your treatment, we will call you to review the results.  Testing/Procedures: None ordered  Follow-Up: At [facility], you and your health needs are our priority.  As part of our continuing mission to provide you with exceptional heart care, we have created designated Provider Care Teams.  These Care Teams include your primary Cardiologist (physician) and Advanced Practice Providers (APPs -  Physician Assistants and Nurse Practitioners) who all work together to provide you with the care you need, when you need it.  We recommend signing up for the patient portal called "MyChart".  Sign up information is provided on this After Visit Summary.  MyChart is used to connect with patients for Virtual Visits (Telemedicine).  Patients are able to view lab/test results, encounter notes, upcoming appointments, etc.  Non-urgent messages can be sent to your provider as well.   To learn more about what you can do with MyChart, go to [email or web address].    Your next appointment:   6 month(s)  The format for your next appointment:   In Person  Provider:   Sanda Klein, MD   Remote monitoring is used to monitor your pacemaker from home. This monitoring reduces the number of office visits required to check your device to one time per year. It allows [place] to keep an eye on the functioning of your device to ensure it is working properly. You are scheduled for a device check from home on 02/09/20. You may send your transmission at any time that day. If you have a wireless device, the transmission will be sent automatically.      Signed, Sanda Klein, MD  11/15/2019 2:19 PM    [facility] [address], [place], [place]  [address] Phone: [phone]; Fax: [phone]

## 2019-11-18 ENCOUNTER — Other Ambulatory Visit: Payer: Medicare Other

## 2019-11-18 ENCOUNTER — Other Ambulatory Visit: Payer: Self-pay

## 2019-11-18 DIAGNOSIS — Z1159 Encounter for screening for other viral diseases: Secondary | ICD-10-CM | POA: Diagnosis not present

## 2019-11-18 DIAGNOSIS — E118 Type 2 diabetes mellitus with unspecified complications: Secondary | ICD-10-CM | POA: Diagnosis not present

## 2019-11-18 DIAGNOSIS — I25119 Atherosclerotic heart disease of native coronary artery with unspecified angina pectoris: Secondary | ICD-10-CM | POA: Diagnosis not present

## 2019-11-18 MED ORDER — DABIGATRAN ETEXILATE MESYLATE 150 MG PO CAPS: 150.0000 mg | ORAL_CAPSULE | Freq: Two times a day (BID) | ORAL | 3 refills | Status: DC

## 2019-11-19 ENCOUNTER — Other Ambulatory Visit: Payer: Self-pay | Admitting: Cardiovascular Disease

## 2019-11-19 LAB — LIPID PANEL
Cholesterol: 142 mg/dL (ref <200)
HDL: 49 mg/dL — ABNORMAL LOW (ref >=50)
LDL Cholesterol (Calc): 76 mg/dL (calc)
Non-HDL Cholesterol (Calc): 93 mg/dL (calc) (ref <130)
Total CHOL/HDL Ratio: 2.9 (calc) (ref <5.0)
Triglycerides: 85 mg/dL (ref <150)

## 2019-11-19 LAB — CBC WITH DIFFERENTIAL/PLATELET
Absolute Monocytes: 540 cells/uL (ref 200–950)
Basophils Absolute: 50 cells/uL (ref 0–200)
Basophils Relative: 0.6 %
Eosinophils Absolute: 125 cells/uL (ref 15–500)
Eosinophils Relative: 1.5 %
HCT: 43.7 % (ref 35.0–45.0)
Hemoglobin: 14.5 g/dL (ref 11.7–15.5)
Lymphs Abs: 1137 cells/uL (ref 850–3900)
MCH: 29.8 pg (ref 27.0–33.0)
MCHC: 33.2 g/dL (ref 32.0–36.0)
MCV: 89.9 fL (ref 80.0–100.0)
MPV: 11.3 fL (ref 7.5–12.5)
Monocytes Relative: 6.5 %
Neutro Abs: 6449 cells/uL (ref 1500–7800)
Neutrophils Relative %: 77.7 %
Platelets: 291 10*3/uL (ref 140–400)
RBC: 4.86 10*6/uL (ref 3.80–5.10)
RDW: 12.3 % (ref 11.0–15.0)
Total Lymphocyte: 13.7 %
WBC: 8.3 10*3/uL (ref 3.8–10.8)

## 2019-11-19 LAB — HEMOGLOBIN A1C
Hgb A1c MFr Bld: 6.4 % of total Hgb — ABNORMAL HIGH (ref <5.7)
Mean Plasma Glucose: 137 (calc)
eAG (mmol/L): 7.6 (calc)

## 2019-11-19 LAB — HEPATITIS C ANTIBODY
Hepatitis C Ab: NONREACTIVE
SIGNAL TO CUT-OFF: 0.01 (ref <1.00)

## 2019-11-19 LAB — COMPLETE METABOLIC PANEL WITH GFR
AG Ratio: 1.7 (calc) (ref 1.0–2.5)
ALT: 8 U/L (ref 6–29)
AST: 10 U/L (ref 10–35)
Albumin: 4.1 g/dL (ref 3.6–5.1)
Alkaline phosphatase (APISO): 105 U/L (ref 37–153)
BUN/Creatinine Ratio: 17 (calc) (ref 6–22)
BUN: 20 mg/dL (ref 7–25)
CO2: 31 mmol/L (ref 20–32)
Calcium: 9.2 mg/dL (ref 8.6–10.4)
Chloride: 100 mmol/L (ref 98–110)
Creat: 1.2 mg/dL — ABNORMAL HIGH (ref 0.60–0.93)
GFR, Est African American: 52 mL/min/{1.73_m2} — ABNORMAL LOW (ref >=60)
GFR, Est Non African American: 44 mL/min/{1.73_m2} — ABNORMAL LOW (ref >=60)
Globulin: 2.4 g/dL (calc) (ref 1.9–3.7)
Glucose, Bld: 120 mg/dL — ABNORMAL HIGH (ref 65–99)
Potassium: 3.9 mmol/L (ref 3.5–5.3)
Sodium: 140 mmol/L (ref 135–146)
Total Bilirubin: 1.2 mg/dL (ref 0.2–1.2)
Total Protein: 6.5 g/dL (ref 6.1–8.1)

## 2019-11-19 LAB — MICROALBUMIN / CREATININE URINE RATIO
Creatinine, Urine: 123 mg/dL (ref 20–275)
Microalb Creat Ratio: 6 mcg/mg creat (ref <30)
Microalb, Ur: 0.7 mg/dL

## 2019-11-19 NOTE — Progress Notes (Signed)
Blood counts are normal. She has no significant protein in her urine from diabetes which is good Cholesterol is above goal just a little--LDL goal is less than 70 for her so we'll address that. Sugar average has trended up to 6.4 from 6.1 but that is still very good. Kidney function is stable with GFR of 44. Liver tests are normal. We'll discuss at her appt

## 2019-11-22 ENCOUNTER — Ambulatory Visit (INDEPENDENT_AMBULATORY_CARE_PROVIDER_SITE_OTHER): Payer: Medicare Other | Admitting: Internal Medicine

## 2019-11-22 ENCOUNTER — Encounter: Payer: Self-pay | Admitting: Internal Medicine

## 2019-11-22 ENCOUNTER — Other Ambulatory Visit: Payer: Self-pay

## 2019-11-22 VITALS — BP 118/62 | HR 60 | Temp 97.5°F | Ht 59.0 in | Wt 132.0 lb

## 2019-11-22 DIAGNOSIS — E118 Type 2 diabetes mellitus with unspecified complications: Secondary | ICD-10-CM

## 2019-11-22 DIAGNOSIS — I4819 Other persistent atrial fibrillation: Secondary | ICD-10-CM

## 2019-11-22 DIAGNOSIS — I2510 Atherosclerotic heart disease of native coronary artery without angina pectoris: Secondary | ICD-10-CM | POA: Diagnosis not present

## 2019-11-22 DIAGNOSIS — I5032 Chronic diastolic (congestive) heart failure: Secondary | ICD-10-CM

## 2019-11-22 DIAGNOSIS — I25119 Atherosclerotic heart disease of native coronary artery with unspecified angina pectoris: Secondary | ICD-10-CM

## 2019-11-22 DIAGNOSIS — N3281 Overactive bladder: Secondary | ICD-10-CM | POA: Diagnosis not present

## 2019-11-22 DIAGNOSIS — E0590 Thyrotoxicosis, unspecified without thyrotoxic crisis or storm: Secondary | ICD-10-CM

## 2019-11-22 NOTE — Progress Notes (Signed)
Location:  [facility] clinic Provider:  Emersyn Wyss L. Mariea Clonts, D.O., C.M.D.  Code Status: full code Goals of Care:  Advanced Directives 11/22/2019  Does Patient Have a Medical Advance Directive? Yes  Type of Paramedic of [place];Living will  Does patient want to make changes to medical advance directive? No - Patient declined  Copy of [place] in Chart? No - copy requested  Pre-existing out of facility DNR order (yellow form or pink MOST form) -     Chief Complaint  Patient presents with  . Medical Management of Chronic Issues    4 month follow up    HPI: Patient is a 74 y.o. female seen today for medical management of chronic diseases.    She had her pacemaker replacement--went well and she healed well.  Dr. Loletha Grayer was pleased with it all.  They reset it.    Bad cholesterol and sugar had trended up on labs.  She was eating more cheese b/c the nutritionist had changed his diet.  They've been eating ice cream.  It helps him.    Breathing well.  She was taken off diltiazem due to HR dropping low and bp.  Today, BP great 118/62 and HR 60.  She feels good like she has more energy.  She has some fatigue with the afib and knows she'll always have some.    Oxybutynin helps her bladder control.   Bowels are doing ok.   Sleeps ok at night--might be restless if she has a lot on her mind.  Might have a rough night once in a while.   She asks if she can take the 3mg  melatonin if that happens.  She says she's had prevnar 13 before.  We don't have that from her records.  Had pneumovax in 2019.    Past Medical History:  Diagnosis Date  . Acute on chronic diastolic CHF (congestive heart failure), NYHA class 1 ([place]) 04/29/2013  . Allergic rhinitis   . Atrial fibrillation ([place])    Pacemaker  . Brady-tachy syndrome (HCC)    s/p STJ dual chamber PPM by Dr Sallyanne Kuster  . CAD (coronary artery disease)    70% RCA stenosis, treated medically  . Control of atrial  fibrillation with pacemaker ([place])   . Diabetes mellitus ([place])   . DJD (degenerative joint disease)   . Dysrhythmia    Paroxysmal Atrial Fib.  . Hypertension   . Hyperthyroidism   . Paroxysmal atrial fibrillation (HCC)   . Polio    as a child  . PONV (postoperative nausea and vomiting)   . Presence of permanent cardiac pacemaker   . Pulmonary disease     Past Surgical History:  Procedure Laterality Date  . arm surgery d/t fx Right 96  . ATRIAL FIBRILLATION ABLATION  07/31/2012; 08-10-13   PVI by Dr Rayann Heman  . ATRIAL FIBRILLATION ABLATION N/A 07/31/2012   Procedure: ATRIAL FIBRILLATION ABLATION;  Surgeon: Thompson Grayer, MD;  Location: [facility] CATH LAB;  Service: Cardiovascular;  Laterality: N/A;  . ATRIAL FIBRILLATION ABLATION N/A 08/10/2013   Procedure: ATRIAL FIBRILLATION ABLATION;  Surgeon: Coralyn Mark, MD;  Location: [place] CATH LAB;  Service: Cardiovascular;  Laterality: N/A;  . BACK SURGERY  07/26/2012   dR. nUDELMAN  . CARDIAC CATHETERIZATION  12/03/2010   single vessel mid RCA  . CARDIAC ELECTROPHYSIOLOGY MAPPING AND ABLATION  07/31/2012   Dr. Thompson Grayer  . CESAREAN SECTION  09/20/1974   Dr. Gertie Fey  . CHOLECYSTECTOMY  1994  .  COLONOSCOPY WITH PROPOFOL N/A 03/21/2015   Procedure: COLONOSCOPY WITH PROPOFOL;  Surgeon: Juanita Craver, MD;  Location: [facility] ENDOSCOPY;  Service: Endoscopy;  Laterality: N/A;  . DIAGNOSTIC MAMMOGRAM  04/16/2017   Hedwig Morton. Morris, DO  . LAPAROSCOPIC HYSTERECTOMY  Late 46s to early 80s  . LEFT HEART CATH AND CORONARY ANGIOGRAPHY N/A 07/30/2017   Procedure: LEFT HEART CATH AND CORONARY ANGIOGRAPHY;  Surgeon: Troy Sine, MD;  Location: [facility] CV LAB;  Service: Cardiovascular;  Laterality: N/A;  . LUMBAR LAMINECTOMY/DECOMPRESSION MICRODISCECTOMY Right 12/23/2012   Procedure: Right L5-S1 Laminotomy for resection of synovial cyst;  Surgeon: Hosie Spangle, MD;  Location: [place] NEURO ORS;  Service: Neurosurgery;  Laterality: Right;  Right Lumbar five-sacral one  Laminotomy for resection of synovial cyst  . NM MYOCAR PERF WALL MOTION  12/20/2010   normal  . PACEMAKER IMPLANT N/A 08/09/2019   Procedure: PACEMAKER IMPLANT;  Surgeon: Sanda Klein, MD;  Location: [place] CV LAB;  Service: Cardiovascular;  Laterality: N/A;  . PACEMAKER INSERTION  12/03/10   SJM Accent DR RF implanted by Dr Sallyanne Kuster  . RIGHT HEART CATH N/A 04/22/2018   Procedure: RIGHT HEART CATH;  Surgeon: Wellington Hampshire, MD;  Location: [place] CV LAB;  Service: Cardiovascular;  Laterality: N/A;  . TEE WITHOUT CARDIOVERSION N/A 07/30/2012   Procedure: TRANSESOPHAGEAL ECHOCARDIOGRAM (TEE);  Surgeon: Sanda Klein, MD;  [facility] ENDOSCOPY;  Service: Cardiovascular;  Laterality: N/A;  h&p in file-hope  . TEE WITHOUT CARDIOVERSION N/A 08/09/2013   Procedure: TRANSESOPHAGEAL ECHOCARDIOGRAM (TEE);  Surgeon: Lelon Perla, MD;  Location: [place];  Service: Cardiovascular;  Laterality: N/A;  . TOTAL ABDOMINAL HYSTERECTOMY  1981  . ULTRASOUND GUIDANCE FOR VASCULAR ACCESS  04/22/2018   Procedure: Ultrasound Guidance For Vascular Access;  Surgeon: Wellington Hampshire, MD;  Location: [facility] CV LAB;  Service: Cardiovascular;;    Allergies  Allergen Reactions  . Codeine Nausea And Vomiting and Nausea Only  . Ketorolac Nausea And Vomiting, Nausea Only and Other (See Comments)    "pt told to never take this med"  . Meperidine Nausea Only  . Demerol Nausea And Vomiting  . Dronedarone Other (See Comments)    Increased fluid retention  . Cyclobenzaprine Nausea And Vomiting    Outpatient Encounter Medications as of 11/22/2019  Medication Sig  . Cholecalciferol (VITAMIN D3) 1000 UNITS CAPS Take 1,000 Units by mouth daily.   . Cyanocobalamin (VITAMIN B12 PO) Take 1 tablet by mouth daily as needed (for energy).   . furosemide (LASIX) 80 MG tablet Take 1 tablet by mouth once daily  . glucose blood (ONETOUCH ULTRA) test strip One Touch Ultra Blue Test Strips Use daily to check blood  sugars. Dx: E11.8  . Lancets Misc. (ONE TOUCH SURESOFT) MISC Use to test  Blood sugar once daily DX: E11.8,  . loperamide (IMODIUM) 2 MG capsule Take 2 mg by mouth as needed for diarrhea or loose stools.   . methimazole (TAPAZOLE) 10 MG tablet TAKE 1 TABLET BY MOUTH ONCE DAILY WITH FOOD FOR THYROID  . metoprolol tartrate (LOPRESSOR) 50 MG tablet Take 1 tablet by mouth twice daily  . oxybutynin (DITROPAN) 5 MG tablet Take 1 tablet (5 mg total) by mouth daily.  . pantoprazole (PROTONIX) 40 MG tablet Take 1 tablet by mouth once daily  . potassium chloride SA (KLOR-CON) 20 MEQ tablet Take 1 tablet by mouth once daily  . PRADAXA 150 MG CAPS capsule Take 1 capsule by mouth twice daily  .  sildenafil (REVATIO) 20 MG tablet TAKE 1 TABLET BY MOUTH THREE TIMES DAILY  . sitaGLIPtin (JANUVIA) 100 MG tablet Take 1 tablet (100 mg total) by mouth daily.  Marland Kitchen spironolactone (ALDACTONE) 25 MG tablet Take 1 tablet by mouth once daily   Facility-Administered Encounter Medications as of 11/22/2019  Medication  . sodium chloride flush (NS) 0.9 % injection 3 mL    Review of Systems:  Review of Systems  Constitutional: Positive for malaise/fatigue. Negative for chills and fever.  HENT: Negative for congestion and sore throat.   Eyes: Negative for blurred vision.       Glasses  Respiratory: Negative for cough and shortness of breath.   Cardiovascular: Negative for chest pain, palpitations and leg swelling.  Gastrointestinal: Negative for abdominal pain, blood in stool, constipation, diarrhea and melena.  Genitourinary: Positive for frequency and urgency.       Overactive bladder  Musculoskeletal: Negative for falls.  Skin: Negative for itching and rash.  Neurological: Negative for dizziness and loss of consciousness.  Psychiatric/Behavioral: Negative for depression and memory loss. The patient has insomnia.        Worries about her husband    Health Maintenance  Topic Date Due  . MAMMOGRAM  04/17/2019  .  OPHTHALMOLOGY EXAM  01/04/2020 (Originally 03/02/1956)  . PNA vac Low Risk Adult (2 of 2 - PCV13) 11/21/2020 (Originally 02/10/2019)  . INFLUENZA VACCINE  01/02/2020  . HEMOGLOBIN A1C  05/19/2020  . FOOT EXAM  06/13/2020  . URINE MICROALBUMIN  11/17/2020  . TETANUS/TDAP  03/06/2023  . COLONOSCOPY  03/20/2025  . DEXA SCAN  Completed  . COVID-19 Vaccine  Completed  . Hepatitis C Screening  Completed    Physical Exam: Vitals:   11/22/19 1424  BP: 118/62  Pulse: 60  Temp: (!) 97.5 F (36.4 C)  TempSrc: Temporal  SpO2: 96%  Weight: 132 lb (59.9 kg)  Height: 4\' 11"  (1.499 m)   Body mass index is 26.66 kg/m. Physical Exam Vitals reviewed.  Constitutional:      General: She is not in acute distress.    Appearance: Normal appearance. She is not toxic-appearing.  HENT:     Head: Normocephalic and atraumatic.  Eyes:     Comments: glasses  Cardiovascular:     Rate and Rhythm: Rhythm irregular.     Heart sounds: No murmur heard.   Pulmonary:     Effort: Pulmonary effort is normal.     Breath sounds: Normal breath sounds. No rales.  Abdominal:     General: Bowel sounds are normal.  Musculoskeletal:        General: Normal range of motion.     Right lower leg: No edema.     Left lower leg: No edema.  Neurological:     General: No focal deficit present.     Mental Status: She is alert and oriented to person, place, and time.  Psychiatric:        Mood and Affect: Mood normal.        Behavior: Behavior normal.        Thought Content: Thought content normal.        Judgment: Judgment normal.     Labs reviewed: Basic Metabolic Panel: Recent Labs    12/15/18 1043 08/05/19 1416 11/18/19 0850  NA  --  138 140  K  --  4.0 3.9  CL  --  99 100  CO2  --  22 31  GLUCOSE  --  122* 120*  BUN  --  20 20  CREATININE  --  1.22* 1.20*  CALCIUM  --  9.5 9.2  TSH 0.962  --   --    Liver Function Tests: Recent Labs    11/18/19 0850  AST 10  ALT 8  BILITOT 1.2  PROT 6.5    No results for input(s): LIPASE, AMYLASE in the last 8760 hours. No results for input(s): AMMONIA in the last 8760 hours. CBC: Recent Labs    08/05/19 1416 11/18/19 0850  WBC 8.6 8.3  NEUTROABS  --  6,449  HGB 13.9 14.5  HCT 40.8 43.7  MCV 87 89.9  PLT 307 291   Lipid Panel: Recent Labs    11/18/19 0850  CHOL 142  HDL 49*  LDLCALC 76  TRIG 85  CHOLHDL 2.9   Lab Results  Component Value Date   HGBA1C 6.4 (H) 11/18/2019    Procedures since last visit: [place]  Result Date: 11/09/2019 Scheduled remote reviewed. Normal device function.  Known permanent AF, on [place] according to previous reports, good ventricular rate control. Next remote 91 days. Kathy Breach, RN, CCDS, [employer]   Assessment/Plan 1. Controlled type 2 diabetes mellitus with complication, without long-term current use of insulin ([place]) -has been well-controlled--cont [place], regular cbg monitoring, healthy diet, encouraged exercise  2. Chronic diastolic heart failure (HCC) -continues on lasix and aldactone and potassium--bmp wnl  3. Overactive bladder -tolerating oxybutynin without concerning side effects, monitor  4. Coronary artery disease involving native coronary artery of native heart without angina pectoris -no difficulty with chest pain, continue beta blocker  5. Hyperthyroidism -stable on methimazole dose  6. Other persistent atrial fibrillation ([place]) -continues on pradaxa for hypercoagulable state, rate controlled with beta blocker and has pacemaker  Labs/tests ordered:    Lab Orders     Hemoglobin A1c     Lipid panel     BASIC METABOLIC PANEL WITH GFR   Next appt:  Visit date not found   Ronon Ferger L. Delmar Dondero, D.O. [facility] [address], [place] [address] Cell Phone (Mon-Fri 8am-5pm):  [phone] On Call:  [phone] & follow prompts after 5pm & weekends Office Phone:   [phone] Office Fax:  [phone]

## 2019-11-22 NOTE — Patient Instructions (Signed)
Please check on the prevnar 13 date for your pneumonia shot.

## 2019-12-24 ENCOUNTER — Other Ambulatory Visit: Payer: Self-pay | Admitting: *Deleted

## 2019-12-24 MED ORDER — PANTOPRAZOLE SODIUM 40 MG PO TBEC: 40.0000 mg | DELAYED_RELEASE_TABLET | Freq: Every day | ORAL | 1 refills | Status: DC

## 2019-12-24 NOTE — Telephone Encounter (Signed)
Received refill Request from [facility].

## 2020-01-05 ENCOUNTER — Other Ambulatory Visit: Payer: Self-pay | Admitting: Cardiovascular Disease

## 2020-01-05 DIAGNOSIS — I272 Pulmonary hypertension, unspecified: Secondary | ICD-10-CM

## 2020-01-12 ENCOUNTER — Other Ambulatory Visit: Payer: Self-pay | Admitting: Cardiovascular Disease

## 2020-01-24 ENCOUNTER — Other Ambulatory Visit: Payer: Self-pay | Admitting: Cardiovascular Disease

## 2020-01-24 ENCOUNTER — Other Ambulatory Visit: Payer: Self-pay | Admitting: Internal Medicine

## 2020-01-24 NOTE — Telephone Encounter (Signed)
Medication refill request received. Routed to Dr. Mariea Clonts for approval because of Very High contraindication that came up when trying to refill medication.

## 2020-01-25 IMAGING — CR DG CHEST 2V
2 series · 2 of 2 positions shown · non-contrast
Comparison: 09/11/2017

CLINICAL DATA: Nausea and shortness of breath for several hours

EXAM:
CHEST - 2 VIEW

[w chest pa]
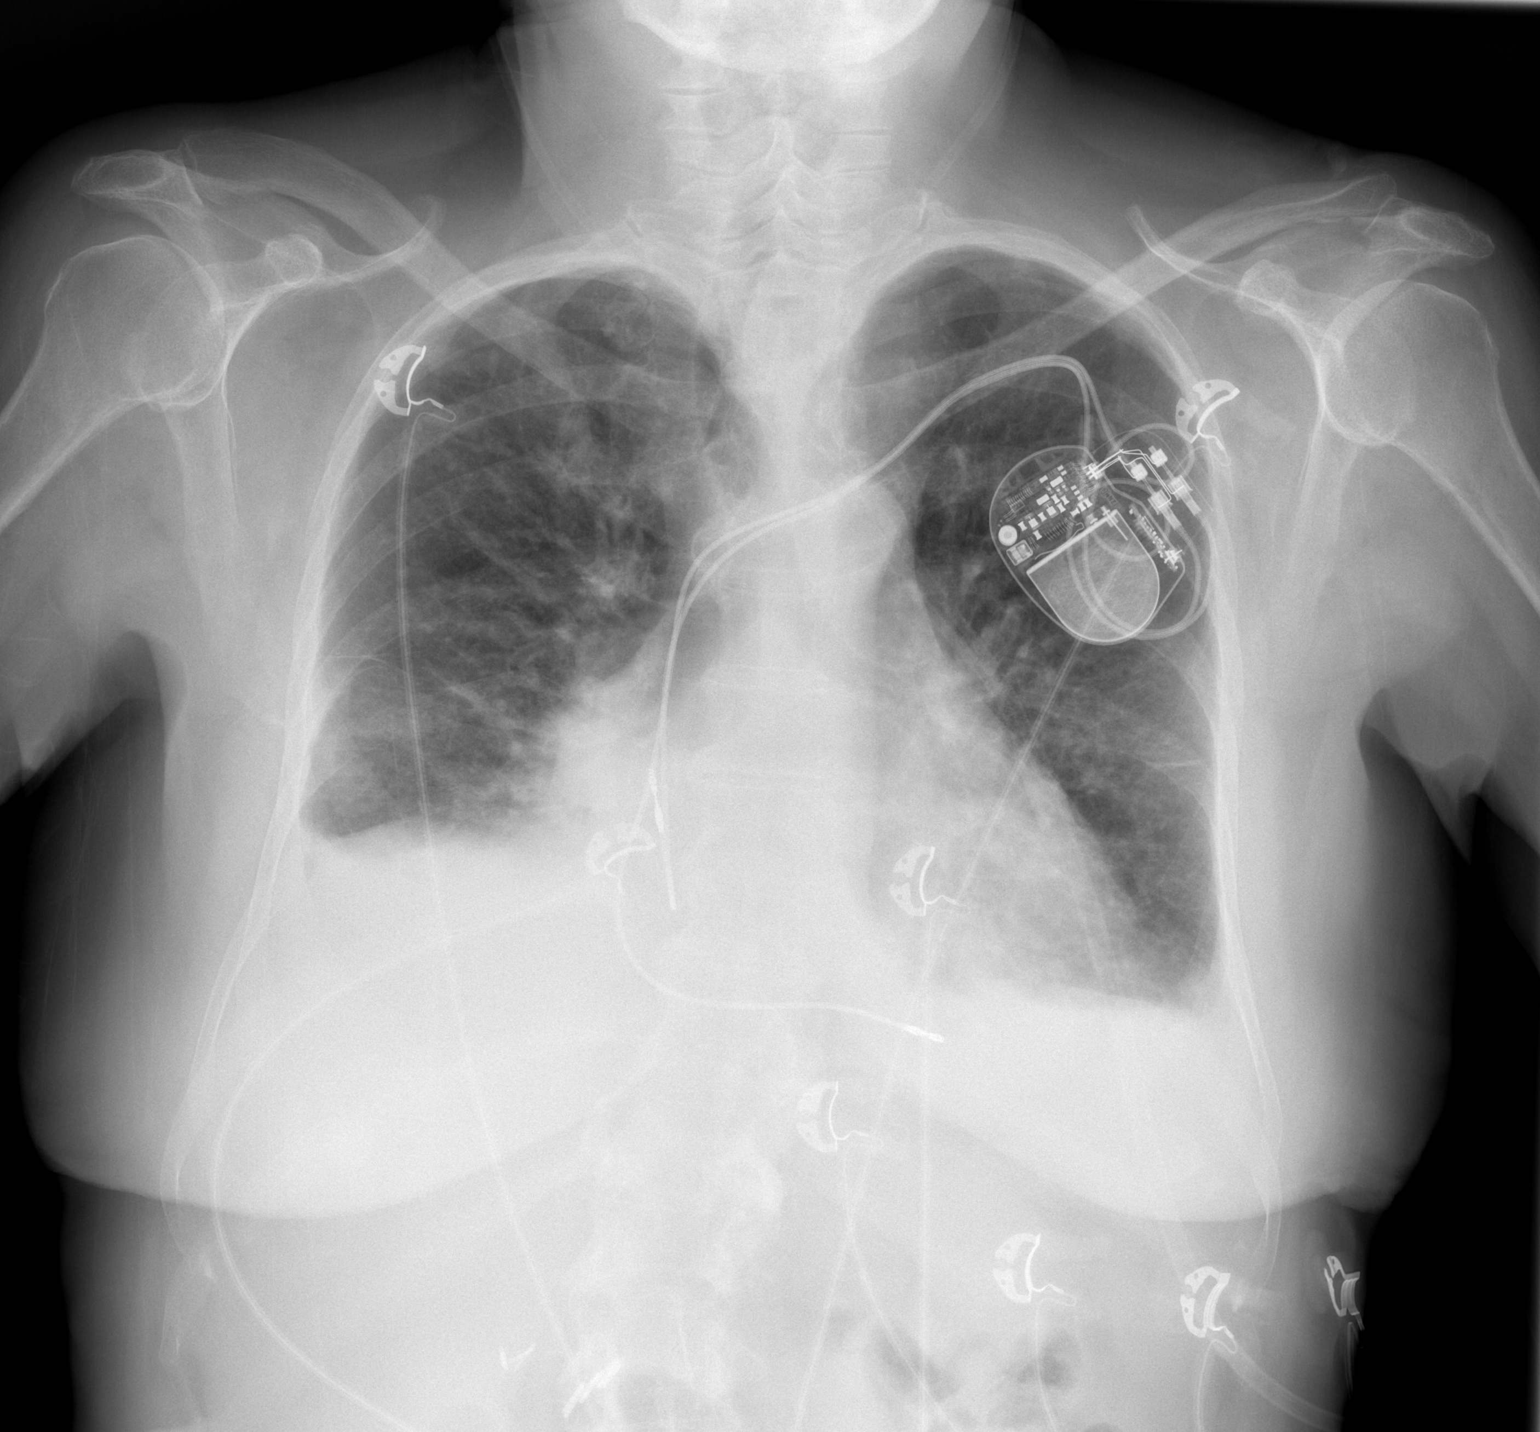

[w chest lat]
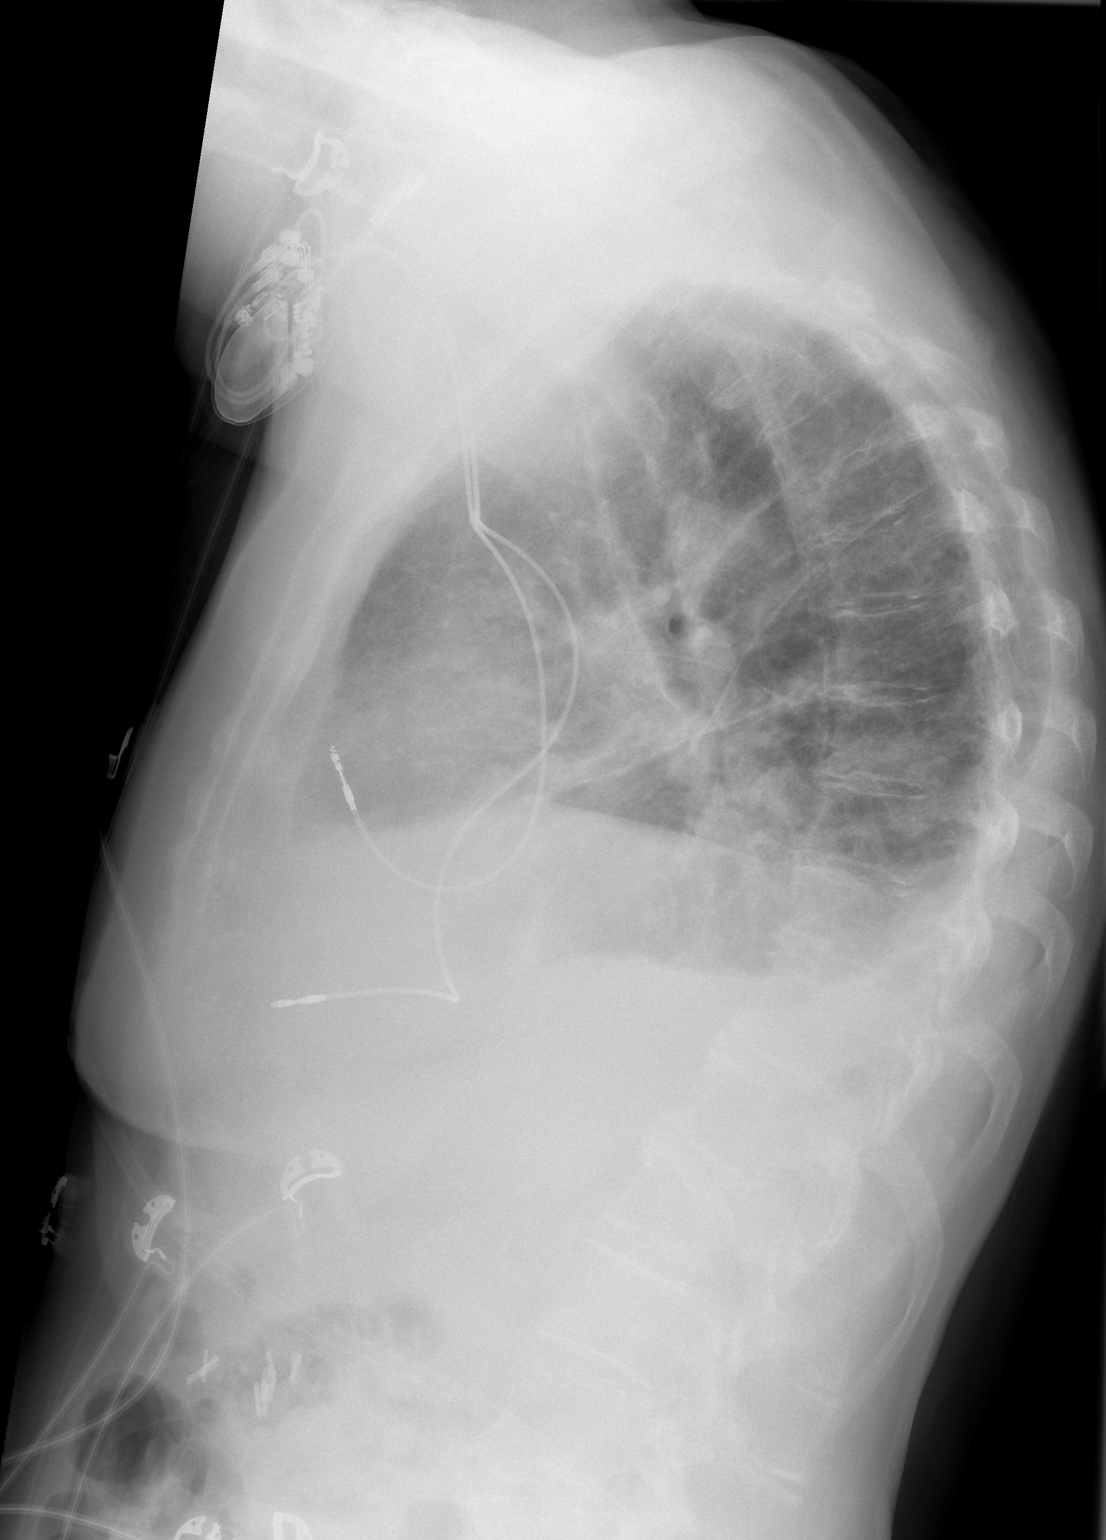

[2 of 2 positions shown; findings below may reference images not displayed]

FINDINGS: Cardiac shadow is enlarged but stable. Defibrillator is again seen.
Patchy infiltrate is noted in the medial right lung base with
chronic blunting of the costophrenic angle. Stable scarring in the
left base is seen as well. No other focal abnormality is noted.
IMPRESSION: Chronic changes in the bases bilaterally with some new increased
right medial infiltrate.

## 2020-01-29 IMAGING — CT CT CHEST W/ CM
2 of 3 series · 15 of 36 positions shown, 18 images · IV contrast (APPLIED)
Comparison: Chest radiograph, 04/19/2018 and older exams.

CLINICAL DATA: Declining oxygen saturations.  Short of breath.

EXAM:
CT CHEST WITH CONTRAST
TECHNIQUE: Multidetector CT imaging of the chest was performed during
intravenous contrast administration.
CONTRAST:  75mL OMNIPAQUE IOHEXOL 300 MG/ML  SOLN

[Series 3: thorax 2.0 i31f 2 · axial · 0.74mm/px · z∈[+1079,+1341]mm · 12 of 155 slices shown, 15 images]
[im 12/155  mediastinal]
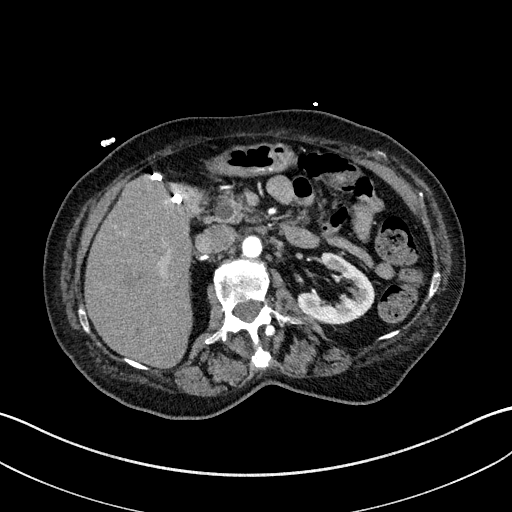
[im 12/155  lung]
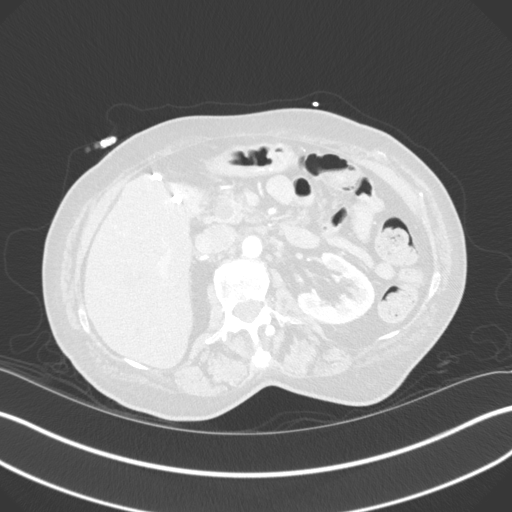
[im 23/155  lung]
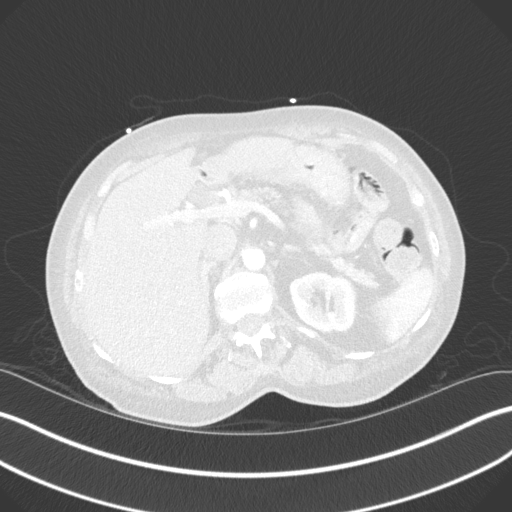
[im 35/155  lung]
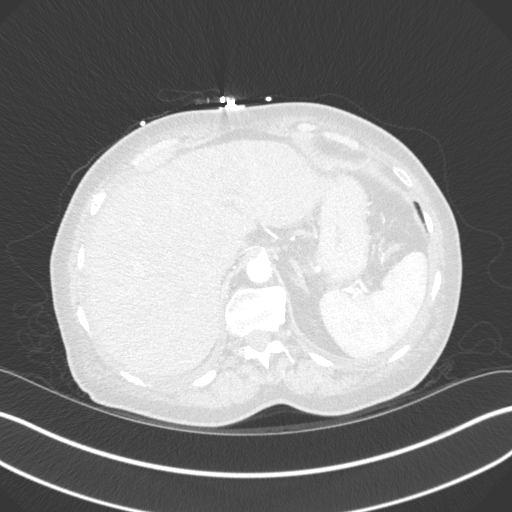
[im 46/155  lung]
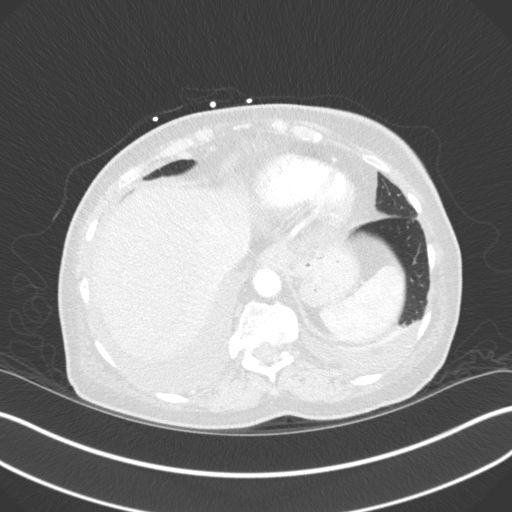
[im 58/155  mediastinal]
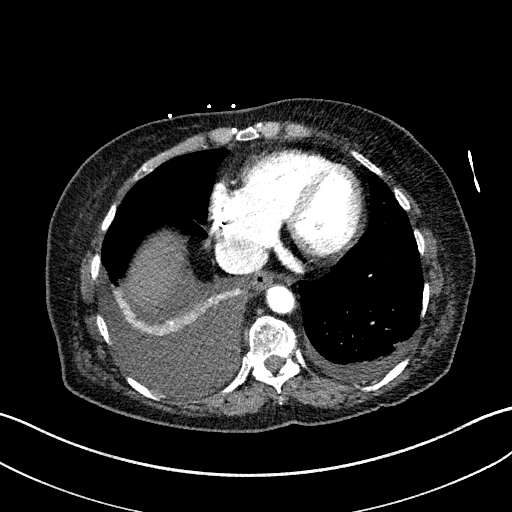
[im 58/155  lung]
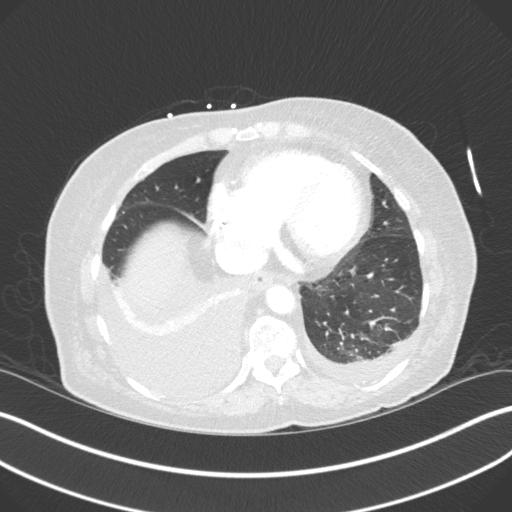
[im 69/155  lung]
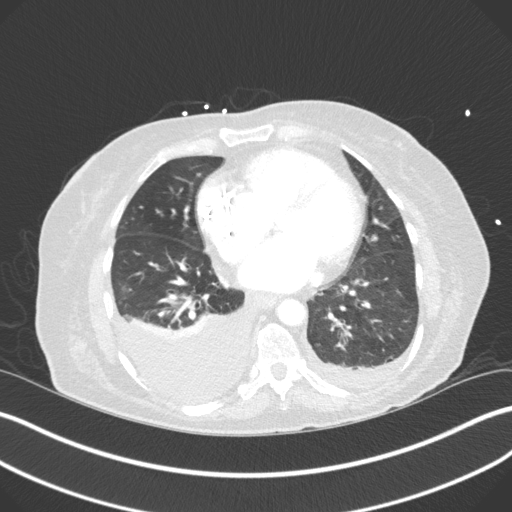
[im 86/155  lung]
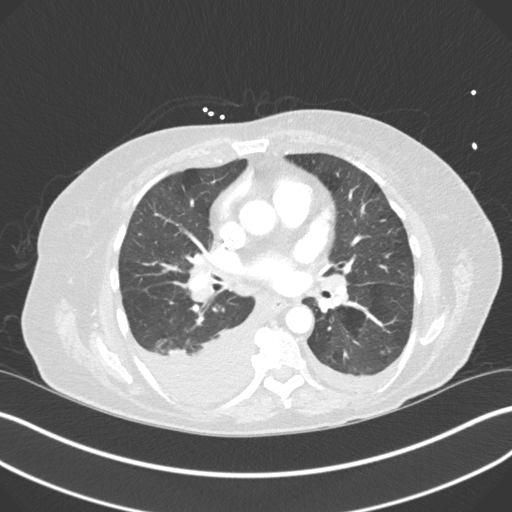
[im 97/155  lung]
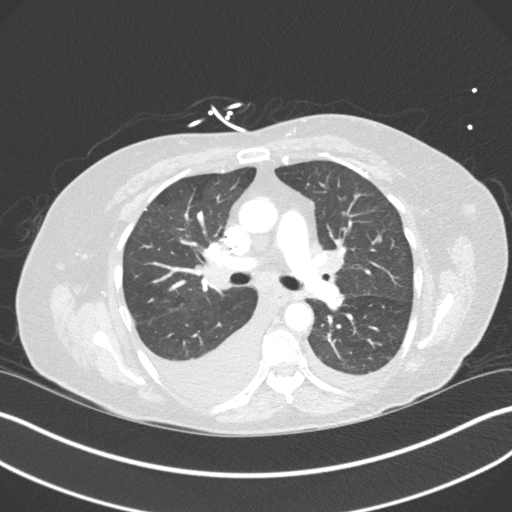
[im 109/155  mediastinal]
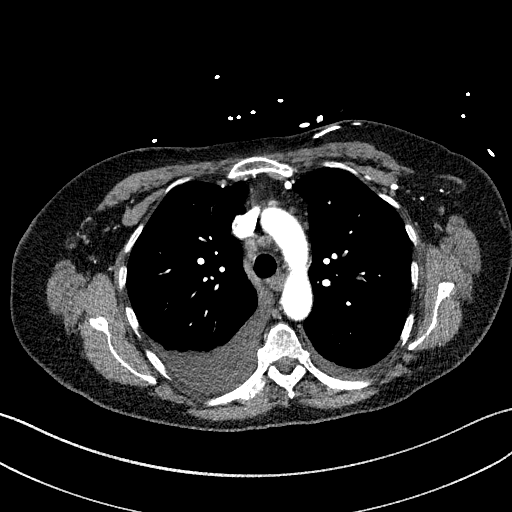
[im 109/155  lung]
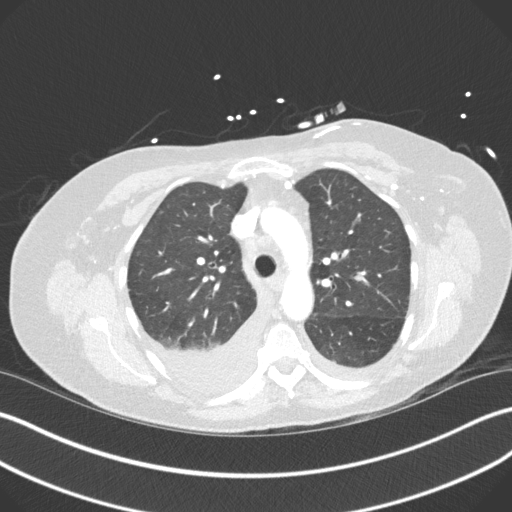
[im 120/155  lung]
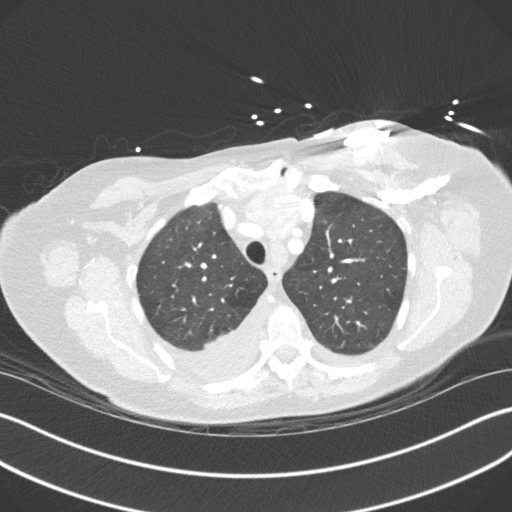
[im 132/155  lung]
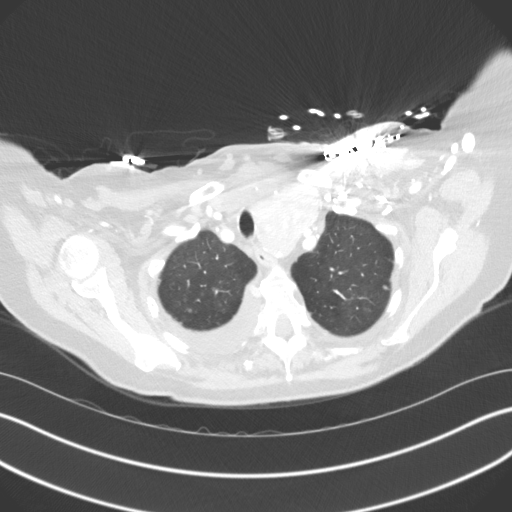
[im 143/155  lung]
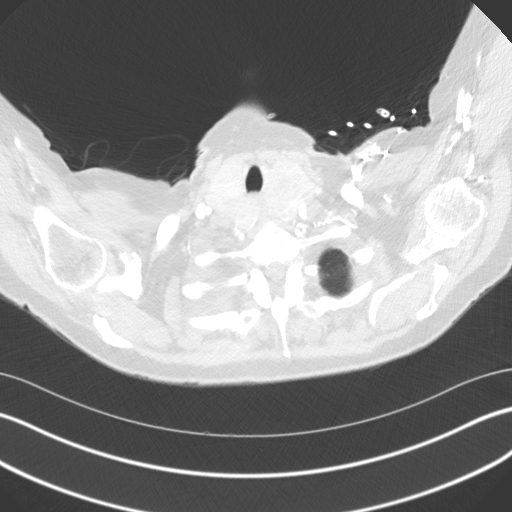

[Series 5: coronal · coronal · 0.64mm/px · 3 of 151 slices shown]
[im 31/151  lung]
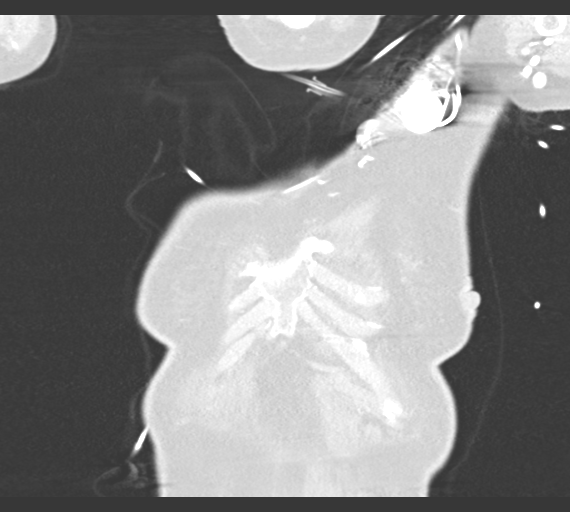
[im 61/151  lung]
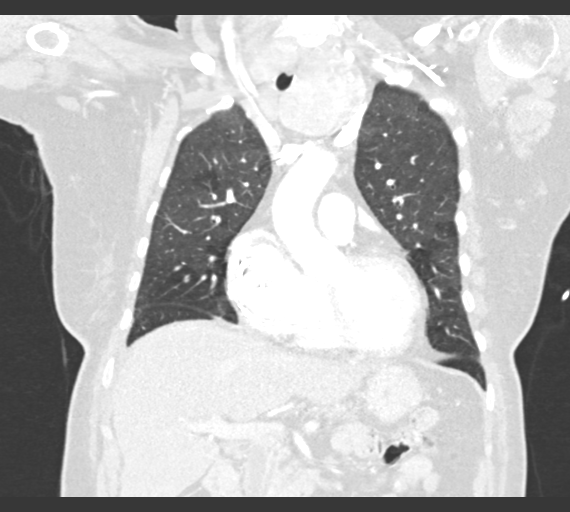
[im 91/151  lung]
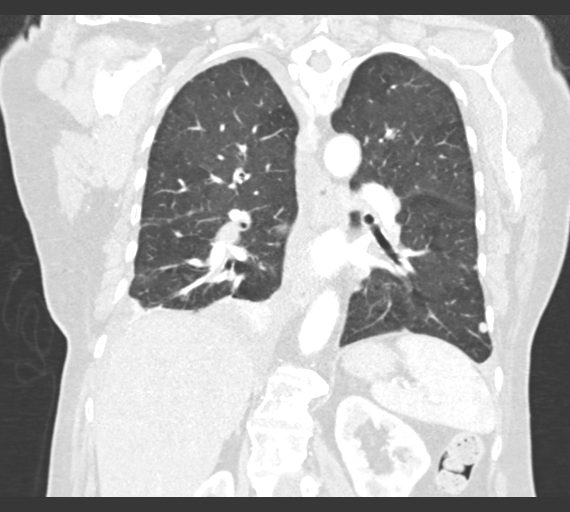

[15 of 36 positions shown; findings below may reference images not displayed]

FINDINGS: Cardiovascular: Heart is normal in size and configuration. No
pericardial effusion. Great vessels are normal in caliber. Mild
aortic atherosclerosis. No dissection.

Mediastinum/Nodes: Heterogeneous enlarged thyroid gland with several
ill-defined nodules. Largest discrete nodule in the right lobe
measuring 17 mm. Left lobe larger than the right deviating the
trachea to the right.

There are prominent, shotty mediastinal lymph nodes, largest a 14 mm
short axis subcarinal node. Are also prominent to mildly enlarged
bilateral hilar lymph nodes. Trachea is widely patent. Esophagus is
unremarkable.

Lungs/Pleura: Moderate right and small left pleural effusions.

There are multiple nodules. 5 mm nodule, left upper lobe, image 47,
series 7. 5 mm nodule, left upper lobe, image 58. 4 mm nodule, left
upper lobe, image 77. 7 x 6 mm nodule, left lower lobe, image 78. 6
mm nodule, left lower lobe, image 101. Ill-defined 5 mm nodule,
right lower lobe, image 85. 4 mm nodule, right upper lobe, image 72.
There are multiple other nodules, 3 mm in less.

Dependent opacity is noted, right greater than left, consistent with
atelectasis. There is no evidence of pneumonia or pulmonary edema.
No pneumothorax.

Upper Abdomen: No acute findings. Status post cholecystectomy.
Common bile duct dilated to 1.5 cm with distal tapering. This is
presumed chronic.

Musculoskeletal: No acute fracture. No osteoblastic or osteolytic
lesions.

Left anterior chest wall pacemaker leads lie within the right atrium
and right ventricle.
IMPRESSION: 1. Moderate right and small left pleural effusions with associated
dependent atelectasis. No evidence of pneumonia or pulmonary edema.
2. Multiple small pulmonary nodules. Non-contrast chest CT at 3-6
months is recommended. If the nodules are stable at time of repeat
CT, then future CT at 18-24 months (from today's scan) is considered
optional for low-risk patients, but is recommended for high-risk
patients. This recommendation follows the consensus statement:
Guidelines for Management of Incidental Pulmonary Nodules Detected
[DATE].
3. Enlarged thyroid reflecting a multinodular goiter. Largest
discrete nodule measures 1.7 cm. Consider further evaluation with
thyroid ultrasound. If patient is clinically hyperthyroid, consider
nuclear medicine thyroid uptake and scan.
4. Aortic atherosclerosis.

Aortic Atherosclerosis ([id number]-UXF.F).

## 2020-02-09 ENCOUNTER — Ambulatory Visit (INDEPENDENT_AMBULATORY_CARE_PROVIDER_SITE_OTHER): Payer: Medicare Other | Admitting: *Deleted

## 2020-02-09 DIAGNOSIS — I4891 Unspecified atrial fibrillation: Secondary | ICD-10-CM | POA: Diagnosis not present

## 2020-02-09 LAB — CUP PACEART REMOTE DEVICE CHECK
Battery Remaining Longevity: 139 mo
Battery Remaining Percentage: 95.5 %
Battery Voltage: 3.02 V
Brady Statistic RV Percent Paced: 93 %
Date Time Interrogation Session: 20210906020014
Implantable Lead Implant Date: 20120702
Implantable Lead Implant Date: 20120702
Implantable Lead Implant Date: 20210308
Implantable Lead Location: 753859
Implantable Lead Location: 753860
Implantable Lead Location: 753860
Implantable Pulse Generator Implant Date: 20210308
Lead Channel Impedance Value: 610 Ohm
Lead Channel Pacing Threshold Amplitude: 0.625 V
Lead Channel Pacing Threshold Pulse Width: 0.5 ms
Lead Channel Sensing Intrinsic Amplitude: 12 mV
Lead Channel Setting Pacing Amplitude: 0.875
Lead Channel Setting Pacing Pulse Width: 0.5 ms
Lead Channel Setting Sensing Sensitivity: 2 mV
Pulse Gen Model: 2272
Pulse Gen Serial Number: 3801383

## 2020-02-10 NOTE — Progress Notes (Signed)
Remote pacemaker transmission.   

## 2020-02-17 DIAGNOSIS — H524 Presbyopia: Secondary | ICD-10-CM | POA: Diagnosis not present

## 2020-02-17 DIAGNOSIS — E119 Type 2 diabetes mellitus without complications: Secondary | ICD-10-CM | POA: Diagnosis not present

## 2020-02-17 DIAGNOSIS — H5203 Hypermetropia, bilateral: Secondary | ICD-10-CM | POA: Diagnosis not present

## 2020-02-17 DIAGNOSIS — H2513 Age-related nuclear cataract, bilateral: Secondary | ICD-10-CM | POA: Diagnosis not present

## 2020-02-17 DIAGNOSIS — H52223 Regular astigmatism, bilateral: Secondary | ICD-10-CM | POA: Diagnosis not present

## 2020-03-23 ENCOUNTER — Other Ambulatory Visit: Payer: Self-pay

## 2020-03-23 ENCOUNTER — Other Ambulatory Visit: Payer: Medicare Other

## 2020-03-23 DIAGNOSIS — E118 Type 2 diabetes mellitus with unspecified complications: Secondary | ICD-10-CM

## 2020-03-23 DIAGNOSIS — I2510 Atherosclerotic heart disease of native coronary artery without angina pectoris: Secondary | ICD-10-CM | POA: Diagnosis not present

## 2020-03-24 LAB — LIPID PANEL
Cholesterol: 124 mg/dL (ref <200)
HDL: 55 mg/dL (ref >=50)
LDL Cholesterol (Calc): 54 mg/dL (calc)
Non-HDL Cholesterol (Calc): 69 mg/dL (calc) (ref <130)
Total CHOL/HDL Ratio: 2.3 (calc) (ref <5.0)
Triglycerides: 66 mg/dL (ref <150)

## 2020-03-24 LAB — BASIC METABOLIC PANEL WITH GFR
BUN/Creatinine Ratio: 13 (calc) (ref 6–22)
BUN: 17 mg/dL (ref 7–25)
CO2: 28 mmol/L (ref 20–32)
Calcium: 9.3 mg/dL (ref 8.6–10.4)
Chloride: 101 mmol/L (ref 98–110)
Creat: 1.27 mg/dL — ABNORMAL HIGH (ref 0.60–0.93)
GFR, Est African American: 48 mL/min/{1.73_m2} — ABNORMAL LOW (ref >=60)
GFR, Est Non African American: 41 mL/min/{1.73_m2} — ABNORMAL LOW (ref >=60)
Glucose, Bld: 100 mg/dL — ABNORMAL HIGH (ref 65–99)
Potassium: 4.2 mmol/L (ref 3.5–5.3)
Sodium: 141 mmol/L (ref 135–146)

## 2020-03-24 LAB — HEMOGLOBIN A1C
Hgb A1c MFr Bld: 6.1 % of total Hgb — ABNORMAL HIGH (ref <5.7)
Mean Plasma Glucose: 128 (calc)
eAG (mmol/L): 7.1 (calc)

## 2020-03-24 NOTE — Progress Notes (Signed)
Kidneys are stable. Cholesterol is at goal Sugar average improved again to 6.1 from 6.4

## 2020-03-27 ENCOUNTER — Ambulatory Visit (INDEPENDENT_AMBULATORY_CARE_PROVIDER_SITE_OTHER): Payer: Medicare Other | Admitting: Internal Medicine

## 2020-03-27 ENCOUNTER — Encounter: Payer: Self-pay | Admitting: Internal Medicine

## 2020-03-27 ENCOUNTER — Other Ambulatory Visit: Payer: Self-pay

## 2020-03-27 VITALS — BP 118/62 | HR 57 | Temp 97.7°F | Ht 59.0 in | Wt 134.8 lb

## 2020-03-27 DIAGNOSIS — Z23 Encounter for immunization: Secondary | ICD-10-CM

## 2020-03-27 DIAGNOSIS — I2510 Atherosclerotic heart disease of native coronary artery without angina pectoris: Secondary | ICD-10-CM

## 2020-03-27 DIAGNOSIS — I5032 Chronic diastolic (congestive) heart failure: Secondary | ICD-10-CM | POA: Diagnosis not present

## 2020-03-27 DIAGNOSIS — E118 Type 2 diabetes mellitus with unspecified complications: Secondary | ICD-10-CM

## 2020-03-27 DIAGNOSIS — I4819 Other persistent atrial fibrillation: Secondary | ICD-10-CM

## 2020-03-27 DIAGNOSIS — N3281 Overactive bladder: Secondary | ICD-10-CM

## 2020-03-27 DIAGNOSIS — E0590 Thyrotoxicosis, unspecified without thyrotoxic crisis or storm: Secondary | ICD-10-CM

## 2020-03-27 DIAGNOSIS — I25119 Atherosclerotic heart disease of native coronary artery with unspecified angina pectoris: Secondary | ICD-10-CM

## 2020-03-27 NOTE — Progress Notes (Signed)
Location:  [address] [facility] clinic Provider:  Andrez Lieurance L. Mariea Clonts, D.O., C.M.D.  Goals of Care:  Advanced Directives 03/27/2020  Does Patient Have a Medical Advance Directive? Yes  Type of Paramedic of [place];Living will  Does patient want to make changes to medical advance directive? No - Patient declined  Copy of [place] in Chart? No - copy requested  Pre-existing out of facility DNR order (yellow form or pink MOST form) -     Chief Complaint  Patient presents with  . Medical Management of Chronic Issues    4 month follow up   . Health Maintenance    Influenza, eye exam   . Acute Visit    handicap plaquered and plates paper work     HPI: Patient is a 74 y.o. female seen today for medical management of chronic diseases.  She's doing well.    Had her eye exam--she is now on dry eye drops.  That makes her nose drain. She also needs cataract surgery and plans to do it next year.  That way her kids can be with Doren Custard.  Dr. Marica Otter.     Her hba1c is much better at 6.1.  She's been watching how much sugar she's eating.  She quit ice cream.  Cholesterol was also at goal.  She's also quit cereal b/c milk seemed to affect her too.  Nothing else is new with her.    No chest pain, sob.  Her pacemaker is working very well.  No palpitations.  No pain.  Sleeping well.    Got flu shot today.  Plans on doing her mammogram next year.  Put off due to covid.   Past Medical History:  Diagnosis Date  . Acute on chronic diastolic CHF (congestive heart failure), NYHA class 1 ([place]) 04/29/2013  . Allergic rhinitis   . Atrial fibrillation ([place])    Pacemaker  . Brady-tachy syndrome (HCC)    s/p STJ dual chamber PPM by Dr Sallyanne Kuster  . CAD (coronary artery disease)    70% RCA stenosis, treated medically  . Control of atrial fibrillation with pacemaker ([place])   . Diabetes mellitus ([place])   . DJD (degenerative joint disease)   . Dysrhythmia    Paroxysmal  Atrial Fib.  . Hypertension   . Hyperthyroidism   . Paroxysmal atrial fibrillation (HCC)   . Polio    as a child  . PONV (postoperative nausea and vomiting)   . Presence of permanent cardiac pacemaker   . Pulmonary disease     Past Surgical History:  Procedure Laterality Date  . arm surgery d/t fx Right 96  . ATRIAL FIBRILLATION ABLATION  07/31/2012; 08-10-13   PVI by Dr Rayann Heman  . ATRIAL FIBRILLATION ABLATION N/A 07/31/2012   Procedure: ATRIAL FIBRILLATION ABLATION;  Surgeon: Thompson Grayer, MD;  Location: [facility] CATH LAB;  Service: Cardiovascular;  Laterality: N/A;  . ATRIAL FIBRILLATION ABLATION N/A 08/10/2013   Procedure: ATRIAL FIBRILLATION ABLATION;  Surgeon: Coralyn Mark, MD;  Location: [place] CATH LAB;  Service: Cardiovascular;  Laterality: N/A;  . BACK SURGERY  07/26/2012   dR. nUDELMAN  . CARDIAC CATHETERIZATION  12/03/2010   single vessel mid RCA  . CARDIAC ELECTROPHYSIOLOGY MAPPING AND ABLATION  07/31/2012   Dr. Thompson Grayer  . CESAREAN SECTION  09/20/1974   Dr. Gertie Fey  . CHOLECYSTECTOMY  1994  . COLONOSCOPY WITH PROPOFOL N/A 03/21/2015   Procedure: COLONOSCOPY WITH PROPOFOL;  Surgeon: Juanita Craver, MD;  Location: [facility] ENDOSCOPY;  Service: Endoscopy;  Laterality: N/A;  . DIAGNOSTIC MAMMOGRAM  04/16/2017   Hedwig Morton. Morris, DO  . LAPAROSCOPIC HYSTERECTOMY  Late 56s to early 80s  . LEFT HEART CATH AND CORONARY ANGIOGRAPHY N/A 07/30/2017   Procedure: LEFT HEART CATH AND CORONARY ANGIOGRAPHY;  Surgeon: Troy Sine, MD;  Location: [place] CV LAB;  Service: Cardiovascular;  Laterality: N/A;  . LUMBAR LAMINECTOMY/DECOMPRESSION MICRODISCECTOMY Right 12/23/2012   Procedure: Right L5-S1 Laminotomy for resection of synovial cyst;  Surgeon: Hosie Spangle, MD;  Location: [place] NEURO ORS;  Service: Neurosurgery;  Laterality: Right;  Right Lumbar five-sacral one Laminotomy for resection of synovial cyst  . NM MYOCAR PERF WALL MOTION  12/20/2010   normal  . PACEMAKER IMPLANT N/A 08/09/2019    Procedure: PACEMAKER IMPLANT;  Surgeon: Sanda Klein, MD;  Location: [place] CV LAB;  Service: Cardiovascular;  Laterality: N/A;  . PACEMAKER INSERTION  12/03/10   SJM Accent DR RF implanted by Dr Sallyanne Kuster  . RIGHT HEART CATH N/A 04/22/2018   Procedure: RIGHT HEART CATH;  Surgeon: Wellington Hampshire, MD;  Location: [place] CV LAB;  Service: Cardiovascular;  Laterality: N/A;  . TEE WITHOUT CARDIOVERSION N/A 07/30/2012   Procedure: TRANSESOPHAGEAL ECHOCARDIOGRAM (TEE);  Surgeon: Sanda Klein, MD;  Location: [facility] ENDOSCOPY;  Service: Cardiovascular;  Laterality: N/A;  h&p in file-[place]  . TEE WITHOUT CARDIOVERSION N/A 08/09/2013   Procedure: TRANSESOPHAGEAL ECHOCARDIOGRAM (TEE);  Surgeon: Lelon Perla, MD;  Location: [place];  Service: Cardiovascular;  Laterality: N/A;  . TOTAL ABDOMINAL HYSTERECTOMY  1981  . ULTRASOUND GUIDANCE FOR VASCULAR ACCESS  04/22/2018   Procedure: Ultrasound Guidance For Vascular Access;  Surgeon: Wellington Hampshire, MD;  Location: [place] CV LAB;  Service: Cardiovascular;;    Allergies  Allergen Reactions  . Codeine Nausea And Vomiting and Nausea Only  . Ketorolac Nausea And Vomiting, Nausea Only and Other (See Comments)    "pt told to never take this med"  . Meperidine Nausea Only  . Demerol Nausea And Vomiting  . Dronedarone Other (See Comments)    Increased fluid retention  . Cyclobenzaprine Nausea And Vomiting    Outpatient Encounter Medications as of 03/27/2020  Medication Sig  . Cholecalciferol (VITAMIN D3) 1000 UNITS CAPS Take 1,000 Units by mouth daily.   . Cyanocobalamin (VITAMIN B12 PO) Take 1 tablet by mouth daily as needed (for energy).   . furosemide (LASIX) 80 MG tablet Take 1 tablet by mouth once daily  . glucose blood (ONETOUCH ULTRA) test strip One Touch Ultra Blue Test Strips Use daily to check blood sugars. Dx: E11.8  . Lancets Misc. (ONE TOUCH SURESOFT) MISC Use to test  Blood sugar once daily DX: E11.8,  . loperamide  (IMODIUM) 2 MG capsule Take 2 mg by mouth as needed for diarrhea or loose stools.   . methimazole (TAPAZOLE) 10 MG tablet TAKE 1 TABLET BY MOUTH ONCE DAILY WITH FOOD FOR THYROID  . metoprolol tartrate (LOPRESSOR) 50 MG tablet Take 1 tablet by mouth twice daily  . oxybutynin (DITROPAN) 5 MG tablet Take 1 tablet (5 mg total) by mouth daily.  . pantoprazole (PROTONIX) 40 MG tablet Take 1 tablet (40 mg total) by mouth daily.  . potassium chloride SA (KLOR-CON) 20 MEQ tablet Take 1 tablet by mouth once daily  . PRADAXA 150 MG CAPS capsule Take 1 capsule by mouth twice daily  . sildenafil (REVATIO) 20 MG tablet TAKE 1 TABLET BY MOUTH THREE TIMES DAILY  . sitaGLIPtin (JANUVIA) 100 MG  tablet Take 1 tablet (100 mg total) by mouth daily.  Marland Kitchen spironolactone (ALDACTONE) 25 MG tablet Take 1 tablet by mouth once daily   No facility-administered encounter medications on file as of 03/27/2020.    Review of Systems:  Review of Systems  Constitutional: Negative for chills and fever.  HENT: Negative for congestion and sore throat.   Eyes: Negative for blurred vision.       Cataracts  Respiratory: Negative for cough and shortness of breath.   Cardiovascular: Negative for chest pain, palpitations, orthopnea and leg swelling.  Gastrointestinal: Negative for abdominal pain, blood in stool, constipation, diarrhea and melena.  Genitourinary: Negative for dysuria.       No change in OAB  Musculoskeletal: Negative for falls and joint pain.  Neurological: Negative for dizziness and loss of consciousness.  Endo/Heme/Allergies: Bruises/bleeds easily.  Psychiatric/Behavioral: Negative for depression and memory loss. The patient is not nervous/anxious and does not have insomnia.     Health Maintenance  Topic Date Due  . OPHTHALMOLOGY EXAM  Never done  . PNA vac Low Risk Adult (2 of 2 - PCV13) 11/21/2020 (Originally 02/10/2019)  . FOOT EXAM  06/13/2020  . HEMOGLOBIN A1C  09/21/2020  . URINE MICROALBUMIN  11/17/2020   . TETANUS/TDAP  03/06/2023  . COLONOSCOPY  03/20/2025  . INFLUENZA VACCINE  Completed  . DEXA SCAN  Completed  . COVID-19 Vaccine  Completed  . Hepatitis C Screening  Completed    Physical Exam: Vitals:   03/27/20 1424  BP: 118/62  Pulse: (!) 57  Temp: 97.7 F (36.5 C)  TempSrc: Temporal  SpO2: 98%  Weight: 134 lb 12.8 oz (61.1 kg)  Height: 4\' 11"  (1.499 m)   Body mass index is 27.23 kg/m. Physical Exam Vitals reviewed.  Constitutional:      General: She is not in acute distress.    Appearance: Normal appearance. She is not toxic-appearing.  HENT:     Head: Normocephalic and atraumatic.  Eyes:     Extraocular Movements: Extraocular movements intact.     Conjunctiva/sclera: Conjunctivae normal.     Pupils: Pupils are equal, round, and reactive to light.  Cardiovascular:     Rate and Rhythm: Normal rate and regular rhythm.  Pulmonary:     Effort: Pulmonary effort is normal.     Breath sounds: Normal breath sounds. No wheezing, rhonchi or rales.  Abdominal:     General: Bowel sounds are normal.     Palpations: Abdomen is soft.     Tenderness: There is no abdominal tenderness. There is no guarding or rebound.  Musculoskeletal:        General: Normal range of motion.     Right lower leg: No edema.     Left lower leg: No edema.  Neurological:     General: No focal deficit present.     Mental Status: She is alert and oriented to person, place, and time.  Psychiatric:        Mood and Affect: Mood normal.        Behavior: Behavior normal.     Labs reviewed: Basic Metabolic Panel: Recent Labs    08/05/19 1416 11/18/19 0850 03/23/20 0914  NA 138 140 141  K 4.0 3.9 4.2  CL 99 100 101  CO2 22 31 28   GLUCOSE 122* 120* 100*  BUN 20 20 17   CREATININE 1.22* 1.20* 1.27*  CALCIUM 9.5 9.2 9.3   Liver Function Tests: Recent Labs    11/18/19 0850  AST 10  ALT 8  BILITOT 1.2  PROT 6.5   No results for input(s): LIPASE, AMYLASE in the last 8760 hours. No  results for input(s): AMMONIA in the last 8760 hours. CBC: Recent Labs    08/05/19 1416 11/18/19 0850  WBC 8.6 8.3  NEUTROABS  --  6,449  HGB 13.9 14.5  HCT 40.8 43.7  MCV 87 89.9  PLT 307 291   Lipid Panel: Recent Labs    11/18/19 0850 03/23/20 0914  CHOL 142 124  HDL 49* 55  LDLCALC 76 54  TRIG 85 66  CHOLHDL 2.9 2.3   Lab Results  Component Value Date   HGBA1C 6.1 (H) 03/23/2020    Assessment/Plan 1. Need for influenza vaccination - Flu Vaccine QUAD High Dose(Fluad)  2. Controlled type 2 diabetes mellitus with complication, without long-term current use of insulin ([place]) - cont januvia  - much improved, doing well - Hemoglobin A1c; Future  3. Chronic diastolic heart failure (HCC) - no problems with volume overload - CBC with Differential/Platelet; Future - COMPLETE METABOLIC PANEL WITH GFR; Future  4. Coronary artery disease involving native coronary artery of native heart without angina pectoris - asymptomatic at this time, bp, glucose and cholesterol well controlled - CBC with Differential/Platelet; Future - COMPLETE METABOLIC PANEL WITH GFR; Future - Lipid panel; Future  5. Hyperthyroidism - cont methimazole - TSH; Future  6. Overactive bladder -cont oxybutynin  7. Other persistent atrial fibrillation (HCC) -s/p pacer, cont pradaxa  Labs/tests ordered:   Lab Orders     CBC with Differential/Platelet     COMPLETE METABOLIC PANEL WITH GFR     Hemoglobin A1c     Lipid panel     TSH  Next appt:  6 mos with fasting labs before  Istvan Behar L. Reneta Niehaus, D.O. [facility] [address], [place] [address] Cell Phone (Mon-Fri 8am-5pm):  [phone] On Call:  [phone] & follow prompts after 5pm & weekends Office Phone:  [phone] Office Fax:  [phone]

## 2020-03-27 NOTE — Patient Instructions (Signed)
Please bring [place] copies of both of your living wills and HCPOAs.

## 2020-04-24 ENCOUNTER — Other Ambulatory Visit: Payer: Self-pay | Admitting: Internal Medicine

## 2020-04-24 NOTE — Telephone Encounter (Signed)
rx sent to pharmacy by e-script  

## 2020-05-10 ENCOUNTER — Ambulatory Visit (INDEPENDENT_AMBULATORY_CARE_PROVIDER_SITE_OTHER): Payer: Medicare Other

## 2020-05-10 DIAGNOSIS — I4891 Unspecified atrial fibrillation: Secondary | ICD-10-CM | POA: Diagnosis not present

## 2020-05-10 LAB — CUP PACEART REMOTE DEVICE CHECK
Battery Remaining Longevity: 134 mo
Battery Remaining Percentage: 95.5 %
Battery Voltage: 3.02 V
Brady Statistic RV Percent Paced: 92 %
Date Time Interrogation Session: 20211208040027
Implantable Lead Implant Date: 20120702
Implantable Lead Implant Date: 20120702
Implantable Lead Implant Date: 20210308
Implantable Lead Location: 753859
Implantable Lead Location: 753860
Implantable Lead Location: 753860
Implantable Pulse Generator Implant Date: 20210308
Lead Channel Impedance Value: 560 Ohm
Lead Channel Pacing Threshold Amplitude: 0.75 V
Lead Channel Pacing Threshold Pulse Width: 0.5 ms
Lead Channel Sensing Intrinsic Amplitude: 10.5 mV
Lead Channel Setting Pacing Amplitude: 1 V
Lead Channel Setting Pacing Pulse Width: 0.5 ms
Lead Channel Setting Sensing Sensitivity: 2 mV
Pulse Gen Model: 2272
Pulse Gen Serial Number: 3801383

## 2020-05-12 DIAGNOSIS — Z23 Encounter for immunization: Secondary | ICD-10-CM | POA: Diagnosis not present

## 2020-05-23 NOTE — Progress Notes (Signed)
Remote pacemaker transmission.   

## 2020-05-29 ENCOUNTER — Other Ambulatory Visit: Payer: Self-pay | Admitting: Internal Medicine

## 2020-05-29 ENCOUNTER — Other Ambulatory Visit: Payer: Self-pay | Admitting: Cardiovascular Disease

## 2020-06-27 ENCOUNTER — Other Ambulatory Visit: Payer: Self-pay | Admitting: Internal Medicine

## 2020-07-02 NOTE — Progress Notes (Signed)
Patient ID: Hailey [name], female   DOB: December 10, 1945, 75 y.o.   MRN: [id number]    Cardiology Office Note    Date:  07/04/2020   ID:  Hailey, [name] [birth year], MRN [id number]  PCP:  Gayland Curry, DO  Cardiologist:  Thompson Grayer, MD;  Sanda Klein, MD   No chief complaint on file. Atrial fibrillation  History of Present Illness:  Hailey [name] is a 75 y.o. female with longstanding persistent atrial fibrillation (s/p RF ablation procedures),s/p dual-chamber permanent pacemaker ([employer] Accent 2012 for tachycardia-bradycardia syndrome and 2:1 atrioventricular block), CAD (nonobstructive by cath February 2019, generator change and new right ventricular lead for high pacing thresholds March 2021), chronic diastolic heart failure (history of acute decompensation on Multaq), multifactorial pulmonary artery hypertension, type 2 diabetes mellitus, hypertension, good lipid profile without therapy, hyperthyroidism (on propylthiouracil).  She returns in follow-up roughly 11 months after pacemaker generator change out and placement of a new right ventricular pacing lead.  She has done well since that time.  She has been in uninterrupted atrial fibrillation now for over 2 years.  He is unaware of the arrhythmia.  There has been a steady increase in the need for ventricular pacing which is now up to 92%.  Despite this she has not had any findings to suggest worsening congestive heart failure.  He is to have mild shortness of breath with activity, usually NYHA functional class I-2.  She does not have orthopnea, PND or edema.  She denies falls, injuries, syncope or near syncope or any bleeding problems.  She denies cough or hemoptysis.  Presenting rhythm was atrial fibrillation with 100% ventricular pacing.  When I checked underlying rhythm today she is not pacemaker dependent.  She has atrial fibrillation with ventricular rate about 50 bpm.  However she tolerates this very poorly.  She feels very short  of breath and dizzy, feels like she will pass out.  As before, her paced QRS complex is quite narrow at about 126 ms.  Otherwise normal pacemaker function.  92% ventricular paced rhythm.  Lead parameters are good.  Has not had any episodes of RVR and heart rate histogram distribution appears appropriate.  Estimated generator longevity is about 11 years.   Echocardiogram July 2020 showed normal left ventricular systolic function and EF of 60-65%, pseudonormal mitral inflow, moderately enlarged right ventricle, biatrial mild dilation, estimated systolic PA pressure 40 mmHg.  Previous right heart catheterization showed that her pulmonary hypertension could not be explained exclusively based on elevated left heart filling pressures.  She was evaluated in the rheumatology clinic by Dr. Amil Amen since she had an elevated ANA of 1: 640, but all her other serological markers were negative for lupus, rheumatoid arthritis or other connective tissue diseases.  Chest CT has shown numerous bilateral pulmonary nodules felt to be nonspecific.  These have been present back as far as 2012.  Repeat pulmonary function tests have been planned in June, but have not been performed.  She has a large goiter and is on chronic antithyroid medications.  Past Medical History:  Diagnosis Date  . Acute on chronic diastolic CHF (congestive heart failure), NYHA class 1 ([place]) 04/29/2013  . Allergic rhinitis   . Atrial fibrillation ([place])    Pacemaker  . Brady-tachy syndrome (HCC)    s/p STJ dual chamber PPM by Dr Sallyanne Kuster  . CAD (coronary artery disease)    70% RCA stenosis, treated medically  . Control of atrial fibrillation with pacemaker ([place])   .  Diabetes mellitus ([place])   . DJD (degenerative joint disease)   . Dysrhythmia    Paroxysmal Atrial Fib.  . Hypertension   . Hyperthyroidism   . Paroxysmal atrial fibrillation (HCC)   . Polio    as a child  . PONV (postoperative nausea and vomiting)   . Presence of permanent  cardiac pacemaker   . Pulmonary disease     Past Surgical History:  Procedure Laterality Date  . arm surgery d/t fx Right 96  . ATRIAL FIBRILLATION ABLATION  07/31/2012; 08-10-13   PVI by Dr Rayann Heman  . ATRIAL FIBRILLATION ABLATION N/A 07/31/2012   Procedure: ATRIAL FIBRILLATION ABLATION;  Surgeon: Thompson Grayer, MD;  Location: [facility] CATH LAB;  Service: Cardiovascular;  Laterality: N/A;  . ATRIAL FIBRILLATION ABLATION N/A 08/10/2013   Procedure: ATRIAL FIBRILLATION ABLATION;  Surgeon: Coralyn Mark, MD;  Location: [facility] CATH LAB;  Service: Cardiovascular;  Laterality: N/A;  . BACK SURGERY  07/26/2012   dR. nUDELMAN  . CARDIAC CATHETERIZATION  12/03/2010   single vessel mid RCA  . CARDIAC ELECTROPHYSIOLOGY MAPPING AND ABLATION  07/31/2012   Dr. Thompson Grayer  . CESAREAN SECTION  09/20/1974   Dr. Gertie Fey  . CHOLECYSTECTOMY  1994  . COLONOSCOPY WITH PROPOFOL N/A 03/21/2015   Procedure: COLONOSCOPY WITH PROPOFOL;  Surgeon: Juanita Craver, MD;  Location: [facility] ENDOSCOPY;  Service: Endoscopy;  Laterality: N/A;  . DIAGNOSTIC MAMMOGRAM  04/16/2017   Hedwig Morton. Morris, DO  . LAPAROSCOPIC HYSTERECTOMY  Late 22s to early 80s  . LEFT HEART CATH AND CORONARY ANGIOGRAPHY N/A 07/30/2017   Procedure: LEFT HEART CATH AND CORONARY ANGIOGRAPHY;  Surgeon: Troy Sine, MD;  Location: [facility] CV LAB;  Service: Cardiovascular;  Laterality: N/A;  . LUMBAR LAMINECTOMY/DECOMPRESSION MICRODISCECTOMY Right 12/23/2012   Procedure: Right L5-S1 Laminotomy for resection of synovial cyst;  Surgeon: Hosie Spangle, MD;  Location: [facility] NEURO ORS;  Service: Neurosurgery;  Laterality: Right;  Right Lumbar five-sacral one Laminotomy for resection of synovial cyst  . NM MYOCAR PERF WALL MOTION  12/20/2010   normal  . PACEMAKER IMPLANT N/A 08/09/2019   Procedure: PACEMAKER IMPLANT;  Surgeon: Sanda Klein, MD;  Location: [facility] CV LAB;  Service: Cardiovascular;  Laterality: N/A;  . PACEMAKER INSERTION  12/03/10   SJM Accent DR RF  implanted by Dr Sallyanne Kuster  . RIGHT HEART CATH N/A 04/22/2018   Procedure: RIGHT HEART CATH;  Surgeon: Wellington Hampshire, MD;  Location: [facility] CV LAB;  Service: Cardiovascular;  Laterality: N/A;  . TEE WITHOUT CARDIOVERSION N/A 07/30/2012   Procedure: TRANSESOPHAGEAL ECHOCARDIOGRAM (TEE);  Surgeon: Sanda Klein, MD;  Location: [facility] ENDOSCOPY;  Service: Cardiovascular;  Laterality: N/A;  h&p in file-hope  . TEE WITHOUT CARDIOVERSION N/A 08/09/2013   Procedure: TRANSESOPHAGEAL ECHOCARDIOGRAM (TEE);  Surgeon: Lelon Perla, MD;  Location: [facility];  Service: Cardiovascular;  Laterality: N/A;  . TOTAL ABDOMINAL HYSTERECTOMY  1981  . ULTRASOUND GUIDANCE FOR VASCULAR ACCESS  04/22/2018   Procedure: Ultrasound Guidance For Vascular Access;  Surgeon: Wellington Hampshire, MD;  Location: [facility] CV LAB;  Service: Cardiovascular;;    Current Medications: Outpatient Medications Prior to Visit  Medication Sig Dispense Refill  . Cholecalciferol (VITAMIN D3) 1000 UNITS CAPS Take 1,000 Units by mouth daily.    . Cyanocobalamin (VITAMIN B12 PO) Take 1 tablet by mouth daily as needed (for energy).     . furosemide (LASIX) 80 MG tablet Take 1 tablet by mouth once daily 90 tablet 3  . glucose blood (  ONETOUCH ULTRA) test strip USE DAILY TO CHECK BLOOD SUGAR E11.8 100 each 11  . Lancets Misc. (ONE TOUCH SURESOFT) MISC Use to test  Blood sugar once daily DX: E11.8, 1 each 0  . loperamide (IMODIUM) 2 MG capsule Take 2 mg by mouth as needed for diarrhea or loose stools.     . methimazole (TAPAZOLE) 10 MG tablet TAKE 1 TABLET BY MOUTH ONCE DAILY WITH FOOD FOR THYROID 90 tablet 3  . oxybutynin (DITROPAN) 5 MG tablet Take 1 tablet (5 mg total) by mouth daily. 90 tablet 3  . pantoprazole (PROTONIX) 40 MG tablet Take 1 tablet by mouth once daily 90 tablet 0  . potassium chloride SA (KLOR-CON) 20 MEQ tablet TAKE 1  BY MOUTH ONCE DAILY 90 tablet 3  . PRADAXA 150 MG CAPS capsule Take 1 capsule by mouth twice daily  180 capsule 1  . sildenafil (REVATIO) 20 MG tablet TAKE 1 TABLET BY MOUTH THREE TIMES DAILY 270 tablet 3  . sitaGLIPtin (JANUVIA) 100 MG tablet Take 1 tablet (100 mg total) by mouth daily. 90 tablet 3  . spironolactone (ALDACTONE) 25 MG tablet Take 1 tablet by mouth once daily 90 tablet 0  . metoprolol tartrate (LOPRESSOR) 50 MG tablet Take 1 tablet by mouth twice daily 180 tablet 3   No facility-administered medications prior to visit.     Allergies:   Codeine, Ketorolac, Meperidine, Demerol, Dronedarone, and Cyclobenzaprine   Social History   Socioeconomic History  . Marital status: Married    Spouse name: [name]  . Number of children: 3  . Years of education: 17  . Highest education level: Some college, no degree  Occupational History  . Occupation: housewife  Tobacco Use  . Smoking status: Never Smoker  . Smokeless tobacco: Never Used  Vaping Use  . Vaping Use: Never used  Substance and Sexual Activity  . Alcohol use: No  . Drug use: No  . Sexual activity: Not Currently    Birth control/protection: Abstinence  Other Topics Concern  . Not on file  Social History Narrative   Lives in [place].  Retired.  Was office clerk in several companies.  Did some technical school after completing high school.  She has no pets.  She and her husband live in a one-story home.  They've been married since 1965.  She follows a heart healthy diet.  She has a living will and hcpoa but does not have a DNR form.  She does struggle to prepare food.     Social Determinants of Health   Financial Resource Strain: Not on file  Food Insecurity: Not on file  Transportation Needs: Not on file  Physical Activity: Not on file  Stress: Not on file  Social Connections: Not on file     Family History:  The patient's family history includes Breast cancer in her sister, sister, and sister; Cancer in her brother; Colon cancer in her sister; Dementia in her brother and sister; Diabetes in her sister;  Heart attack in her sister; Heart disease in her sister and sister; Prostate cancer in her brother; Suicidality in her brother.   ROS:   Please see the history of present illness.    All other systems are reviewed and are negative.   PHYSICAL EXAM:   VS:  BP (!) 122/58   Pulse 60   Ht 4\' 11"  (1.499 m)   Wt 137 lb 6.4 oz (62.3 kg)   SpO2 100%   BMI 27.75 kg/m  General: Alert, oriented x3, no distress, will heal the subclavian pacemaker site Head: no evidence of trauma, PERRL, EOMI, no exophtalmos or lid lag, no myxedema, no xanthelasma; normal ears, nose and oropharynx Neck: normal jugular venous pulsations and no hepatojugular reflux; brisk carotid pulses without delay and no carotid bruits Chest: clear to auscultation, no signs of consolidation by percussion or palpation, normal fremitus, symmetrical and full respiratory excursions Cardiovascular: normal position and quality of the apical impulse, regular rhythm, normal first and paradoxically split second heart sounds, no murmurs, rubs or gallops Abdomen: no tenderness or distention, no masses by palpation, no abnormal pulsatility or arterial bruits, normal bowel sounds, no hepatosplenomegaly Extremities: no clubbing, cyanosis or edema; 2+ radial, ulnar and brachial pulses bilaterally; 2+ right femoral, posterior tibial and dorsalis pedis pulses; 2+ left femoral, posterior tibial and dorsalis pedis pulses; no subclavian or femoral bruits Neurological: grossly nonfocal Psych: Normal mood and affect    Wt Readings from Last 3 Encounters:  07/03/20 137 lb 6.4 oz (62.3 kg)  03/27/20 134 lb 12.8 oz (61.1 kg)  11/22/19 132 lb (59.9 kg)      Studies/Labs Reviewed:   Right heart catheterization April 22, 2018 1. Mildly elevated left-sided filling pressures. 2. Moderate to severe pulmonary hypertension with normal cardiac output.  Pulmonary capillary wedge pressure: 19 mmHg. PA pressure: 68/28 with a mean of 43  mmHg. Pulmonary vascular resistance: 4.9 Woods units Cardiac index was 2.97.  Recommendations: The patient seems to have mixed pulmonary hypertension due to left-sided heart failure and primary pulmonary hypertension. She is mildly volume overloaded and I suspect that she can switch to oral furosemide tomorrow. Consider vasodilator therapy for pulmonary hypertension.  Left heart catheterization July 30, 2017   Ost 1st Diag to 1st Diag lesion is 10% stenosed.  Mid RCA lesion is 20% stenosed.  The left ventricular systolic function is normal.  LV end diastolic pressure is normal.  No significant coronary obstructive disease with mild smooth 10% narrowing in the proximal to midportion of the first diagonal branch of the LAD; normal left circumflex coronary artery; and smooth 20% mid RCA narrowing.  Normal LV function without focal segmental wall motion abnormalities. LVEDP 7.  RECOMMENDATION: Medical therapy.  Echo April 20, 2018   - Left ventricle: The cavity size was normal. Systolic function was normal. The estimated ejection fraction was in the range of 55% to 60%. Wall motion was normal; there were no regional wall motion abnormalities. There was a reduced contribution of atrial contraction to ventricular filling, due to increased ventricular diastolic pressure or atrial contractile dysfunction. Features are consistent with a pseudonormal left ventricular filling pattern, with concomitant abnormal relaxation and increased filling pressure (grade 2 diastolic dysfunction). - Left atrium: The atrium was mildly dilated. - Right ventricle: The cavity size was mildly dilated. Wall thickness was normal. - Right atrium: The atrium was mildly dilated. - Tricuspid valve: There was mild-moderate regurgitation directed centrally. - Pulmonary arteries: Systolic pressure was moderately to severely increased. PA peak pressure: 66 mm Hg  (S).  Impressions:  - PA pressure has increased since the previous study.      EKG:  EKG is ordered today and it shows atrial fibrillation with 100% ventricular pacing, QRS 126 ms, QTC 460 ms  Lipid Panel    Component Value Date/Time   CHOL 124 03/23/2020 0914   TRIG 66 03/23/2020 0914   HDL 55 03/23/2020 0914   CHOLHDL 2.3 03/23/2020 0914   VLDL 10 11/24/2010 0837  LDLCALC 54 03/23/2020 0914    ASSESSMENT:    1. Chronic diastolic heart failure ([place])   2. PAH (pulmonary artery hypertension) ([place])   3. Longstanding persistent atrial fibrillation ([place])   4. Long term current use of anticoagulant   5. SSS (sick sinus syndrome) (HCC)   6. Second degree AV block   7. Pacemaker   8. Coronary artery disease involving native coronary artery of native heart without angina pectoris      PLAN:  In order of problems listed above:   1. CHF: Clinically euvolemic.  NYHA functional class I-2.  Despite a very high prevalence of ventricular pacing, has not had any heart failure exacerbation in a couple of years. 2. PAH: This is felt to be multifactorial, partly due to diastolic left heart failure, but partly due to intrinsic arteriolar lung disease, with some equivocal evidence of interstitial lung disease and autoimmune findings.  Seems to be tolerating sildenafil very well. 3. AFib: Longstanding persistent/now a permanent arrhythmia.  Very well rate controlled, in fact with a high burden of ventricular pacing.  Interestingly she does not tolerate native AV conduction nearly as well as she feels when she has ventricular pacing.  Nevertheless we will try to cut back on her beta-blocker dose, especially since her diastolic blood pressure is quite low, usually in the 50s.  Compliant with anticoagulation.  CHADSVasc at least 5 (age, gender, DM, CAD, history of CHF). 4. Anticoagulation: Tolerated without any bleeding complications. 5. SSS: Precedes diagnosis of persistent atrial fibrillation  and was the initial reason for device implantation.. 6. 2nd deg AVB: She feels very poorly when she lost ventricular capture in the past, when we check ventricular pacing thresholds or underlying rhythm and seems to prefer ventricular pacing. 7. PPM: Required a new right ventricular pacing lead last year, normal device function.  Remote downloads every 3 months. 8. CAD: Asymptomatic/no angina pectoris.  Nonobstructive disease by cath in February 2019.  Most recent lipid profile was excellent with an LDL cholesterol of 54 and HDL of 55.  Well-controlled glucose, hemoglobin A1c 6.1%  Medication Adjustments/Labs and Tests Ordered: Current medicines are reviewed at length with the patient today.  Concerns regarding medicines are outlined above.  Medication changes, Labs and Tests ordered today are listed in the Patient Instructions below.   Patient Instructions  Medication Instructions:  Metoprolol- Take 50 mg in the morning and 25 mg (half a tablet) in the evening.  *If you need a refill on your cardiac medications before your next appointment, please call your pharmacy*   Lab Work: None ordered If you have labs (blood work) drawn today and your tests are completely normal, you will receive your results only by: Marland Kitchen MyChart Message (if you have MyChart) OR . A paper copy in the mail If you have any lab test that is abnormal or we need to change your treatment, we will call you to review the results.   Testing/Procedures: None ordered   Follow-Up: At [facility], you and your health needs are our priority.  As part of our continuing mission to provide you with exceptional heart care, we have created designated Provider Care Teams.  These Care Teams include your primary Cardiologist (physician) and Advanced Practice Providers (APPs -  Physician Assistants and Nurse Practitioners) who all work together to provide you with the care you need, when you need it.  We recommend signing up for the  patient portal called "MyChart".  Sign up information is provided on this  After Visit Summary.  MyChart is used to connect with patients for Virtual Visits (Telemedicine).  Patients are able to view lab/test results, encounter notes, upcoming appointments, etc.  Non-urgent messages can be sent to your provider as well.   To learn more about what you can do with MyChart, go to [email or web address].    Your next appointment:   6 month(s)  The format for your next appointment:   In Person  Provider:   Sanda Klein, MD       Signed, Sanda Klein, MD  07/04/2020 6:32 PM    [address], [place], [place]  [address] Phone: [phone]; Fax: [phone]

## 2020-07-03 ENCOUNTER — Other Ambulatory Visit: Payer: Self-pay

## 2020-07-03 ENCOUNTER — Ambulatory Visit (INDEPENDENT_AMBULATORY_CARE_PROVIDER_SITE_OTHER): Payer: Medicare Other | Admitting: Cardiovascular Disease

## 2020-07-03 VITALS — BP 122/58 | HR 60 | Ht 59.0 in | Wt 137.4 lb

## 2020-07-03 DIAGNOSIS — I4811 Longstanding persistent atrial fibrillation: Secondary | ICD-10-CM

## 2020-07-03 DIAGNOSIS — I441 Atrioventricular block, second degree: Secondary | ICD-10-CM

## 2020-07-03 DIAGNOSIS — I2510 Atherosclerotic heart disease of native coronary artery without angina pectoris: Secondary | ICD-10-CM

## 2020-07-03 DIAGNOSIS — I495 Sick sinus syndrome: Secondary | ICD-10-CM | POA: Diagnosis not present

## 2020-07-03 DIAGNOSIS — I5032 Chronic diastolic (congestive) heart failure: Secondary | ICD-10-CM

## 2020-07-03 DIAGNOSIS — Z950 Presence of cardiac pacemaker: Secondary | ICD-10-CM | POA: Diagnosis not present

## 2020-07-03 DIAGNOSIS — Z7901 Long term (current) use of anticoagulants: Secondary | ICD-10-CM

## 2020-07-03 DIAGNOSIS — I2721 Secondary pulmonary arterial hypertension: Secondary | ICD-10-CM

## 2020-07-03 MED ORDER — METOPROLOL TARTRATE 50 MG PO TABS
ORAL_TABLET | ORAL | 3 refills | Status: DC
Start: 2020-07-03 — End: 2021-04-02

## 2020-07-03 NOTE — Patient Instructions (Signed)
Medication Instructions:  Metoprolol- Take 50 mg in the morning and 25 mg (half a tablet) in the evening.  *If you need a refill on your cardiac medications before your next appointment, please call your pharmacy*   Lab Work: None ordered If you have labs (blood work) drawn today and your tests are completely normal, you will receive your results only by: Marland Kitchen MyChart Message (if you have MyChart) OR . A paper copy in the mail If you have any lab test that is abnormal or we need to change your treatment, we will call you to review the results.   Testing/Procedures: None ordered   Follow-Up: At [facility], you and your health needs are our priority.  As part of our continuing mission to provide you with exceptional heart care, we have created designated Provider Care Teams.  These Care Teams include your primary Cardiologist (physician) and Advanced Practice Providers (APPs -  Physician Assistants and Nurse Practitioners) who all work together to provide you with the care you need, when you need it.  We recommend signing up for the patient portal called "MyChart".  Sign up information is provided on this After Visit Summary.  MyChart is used to connect with patients for Virtual Visits (Telemedicine).  Patients are able to view lab/test results, encounter notes, upcoming appointments, etc.  Non-urgent messages can be sent to your provider as well.   To learn more about what you can do with MyChart, go to [email or web address].    Your next appointment:   6 month(s)  The format for your next appointment:   In Person  Provider:   Sanda Klein, MD

## 2020-07-04 ENCOUNTER — Encounter: Payer: Self-pay | Admitting: Cardiovascular Disease

## 2020-07-24 ENCOUNTER — Encounter: Payer: Self-pay | Admitting: Internal Medicine

## 2020-08-09 ENCOUNTER — Ambulatory Visit (INDEPENDENT_AMBULATORY_CARE_PROVIDER_SITE_OTHER): Payer: Medicare Other

## 2020-08-09 ENCOUNTER — Other Ambulatory Visit: Payer: Self-pay | Admitting: Internal Medicine

## 2020-08-09 DIAGNOSIS — I495 Sick sinus syndrome: Secondary | ICD-10-CM | POA: Diagnosis not present

## 2020-08-09 NOTE — Telephone Encounter (Signed)
Refill request from pharmacy Pended Rx and sent to Dr. Mariea Clonts for approval due to [place].

## 2020-08-11 LAB — CUP PACEART REMOTE DEVICE CHECK
Battery Remaining Longevity: 134 mo
Battery Remaining Percentage: 95.5 %
Battery Voltage: 3.02 V
Brady Statistic RV Percent Paced: 94 %
Date Time Interrogation Session: 20220309040017
Implantable Lead Implant Date: 20120702
Implantable Lead Implant Date: 20120702
Implantable Lead Implant Date: 20210308
Implantable Lead Location: 753859
Implantable Lead Location: 753860
Implantable Lead Location: 753860
Implantable Pulse Generator Implant Date: 20210308
Lead Channel Impedance Value: 640 Ohm
Lead Channel Pacing Threshold Amplitude: 0.75 V
Lead Channel Pacing Threshold Pulse Width: 0.5 ms
Lead Channel Sensing Intrinsic Amplitude: 12 mV
Lead Channel Setting Pacing Amplitude: 1 V
Lead Channel Setting Pacing Pulse Width: 0.5 ms
Lead Channel Setting Sensing Sensitivity: 2 mV
Pulse Gen Model: 2272
Pulse Gen Serial Number: 3801383

## 2020-08-17 NOTE — Progress Notes (Signed)
Remote pacemaker transmission.   

## 2020-09-04 ENCOUNTER — Other Ambulatory Visit: Payer: Self-pay | Admitting: Internal Medicine

## 2020-09-04 DIAGNOSIS — N3281 Overactive bladder: Secondary | ICD-10-CM

## 2020-09-04 DIAGNOSIS — E118 Type 2 diabetes mellitus with unspecified complications: Secondary | ICD-10-CM

## 2020-09-11 ENCOUNTER — Other Ambulatory Visit: Payer: Medicare Other

## 2020-09-14 ENCOUNTER — Ambulatory Visit: Payer: Medicare Other | Admitting: Internal Medicine

## 2020-09-25 ENCOUNTER — Other Ambulatory Visit: Payer: Self-pay | Admitting: Internal Medicine

## 2020-09-25 ENCOUNTER — Other Ambulatory Visit: Payer: Medicare Other

## 2020-09-28 ENCOUNTER — Ambulatory Visit: Payer: Medicare Other | Admitting: Internal Medicine

## 2020-10-03 DIAGNOSIS — H25043 Posterior subcapsular polar age-related cataract, bilateral: Secondary | ICD-10-CM | POA: Diagnosis not present

## 2020-10-03 DIAGNOSIS — H2511 Age-related nuclear cataract, right eye: Secondary | ICD-10-CM | POA: Diagnosis not present

## 2020-10-03 DIAGNOSIS — H18413 Arcus senilis, bilateral: Secondary | ICD-10-CM | POA: Diagnosis not present

## 2020-10-03 DIAGNOSIS — H25013 Cortical age-related cataract, bilateral: Secondary | ICD-10-CM | POA: Diagnosis not present

## 2020-10-03 DIAGNOSIS — H2513 Age-related nuclear cataract, bilateral: Secondary | ICD-10-CM | POA: Diagnosis not present

## 2020-10-04 ENCOUNTER — Telehealth: Payer: Self-pay

## 2020-10-04 NOTE — Telephone Encounter (Signed)
   [facility] Pre-operative Risk Assessment    Patient Name: Hailey [name]   1946/05/24  Request for surgical clearance:  1. What type of surgery is being performed? Cataract extraction w/intraocular implantation of the RIGHT EYE followed by the LEFT EYE   2. When is this surgery scheduled? 10-16-2020   3. What type of clearance is required (medical clearance vs. Pharmacy clearance to hold med vs. Both)? MEDICAL  4. Are there any medications that need to be held prior to surgery and how long?NONE LISTED   5. Practice name and name of physician performing surgery? [facility]    6. What is the office phone number? [phone]   7.   What is the office fax number? [phone]  8.   Anesthesia type (None, local, MAC, general) ? TOPICAL w/IV MEDICATION

## 2020-10-04 NOTE — Telephone Encounter (Signed)
   Patient Name: Hailey [name]  DOB December 07, 2045  Primary Cardiologist: Sanda Klein, MD  Chart reviewed as part of pre-operative protocol coverage. Cataract extractions are recognized in guidelines as low risk surgeries that do not typically require specific preoperative testing or holding of blood thinner therapy. Therefore, given past medical history and time since last visit, based on ACC/AHA guidelines, [place] would be at acceptable risk for the planned procedure without further cardiovascular testing.   I will route this recommendation to the requesting party via Epic fax function and remove from pre-op pool.  Please call with questions.  Tami Lin Marty Sadlowski, PA 10/04/2020, 2:54 PM

## 2020-10-13 ENCOUNTER — Other Ambulatory Visit: Payer: Medicare Other

## 2020-10-16 DIAGNOSIS — H2511 Age-related nuclear cataract, right eye: Secondary | ICD-10-CM | POA: Diagnosis not present

## 2020-10-17 ENCOUNTER — Ambulatory Visit: Payer: Medicare Other | Admitting: Family Medicine

## 2020-10-17 DIAGNOSIS — H2512 Age-related nuclear cataract, left eye: Secondary | ICD-10-CM | POA: Diagnosis not present

## 2020-11-06 DIAGNOSIS — H2512 Age-related nuclear cataract, left eye: Secondary | ICD-10-CM | POA: Diagnosis not present

## 2020-11-08 ENCOUNTER — Ambulatory Visit (INDEPENDENT_AMBULATORY_CARE_PROVIDER_SITE_OTHER): Payer: Medicare Other

## 2020-11-08 DIAGNOSIS — I495 Sick sinus syndrome: Secondary | ICD-10-CM | POA: Diagnosis not present

## 2020-11-08 LAB — CUP PACEART REMOTE DEVICE CHECK
Battery Remaining Longevity: 145 mo
Battery Remaining Percentage: 95.5 %
Battery Voltage: 3.01 V
Brady Statistic RV Percent Paced: 94 %
Date Time Interrogation Session: 20220608040014
Implantable Lead Implant Date: 20120702
Implantable Lead Implant Date: 20120702
Implantable Lead Implant Date: 20210308
Implantable Lead Location: 753859
Implantable Lead Location: 753860
Implantable Lead Location: 753860
Implantable Pulse Generator Implant Date: 20210308
Lead Channel Impedance Value: 610 Ohm
Lead Channel Pacing Threshold Amplitude: 0.625 V
Lead Channel Pacing Threshold Pulse Width: 0.5 ms
Lead Channel Sensing Intrinsic Amplitude: 10.2 mV
Lead Channel Setting Pacing Amplitude: 0.875
Lead Channel Setting Pacing Pulse Width: 0.5 ms
Lead Channel Setting Sensing Sensitivity: 2 mV
Pulse Gen Model: 2272
Pulse Gen Serial Number: 3801383

## 2020-11-10 ENCOUNTER — Other Ambulatory Visit: Payer: Self-pay

## 2020-11-10 DIAGNOSIS — I5032 Chronic diastolic (congestive) heart failure: Secondary | ICD-10-CM

## 2020-11-10 DIAGNOSIS — K766 Portal hypertension: Secondary | ICD-10-CM

## 2020-11-10 DIAGNOSIS — I10 Essential (primary) hypertension: Secondary | ICD-10-CM

## 2020-11-10 DIAGNOSIS — E118 Type 2 diabetes mellitus with unspecified complications: Secondary | ICD-10-CM

## 2020-11-10 DIAGNOSIS — I4819 Other persistent atrial fibrillation: Secondary | ICD-10-CM

## 2020-11-10 DIAGNOSIS — E0590 Thyrotoxicosis, unspecified without thyrotoxic crisis or storm: Secondary | ICD-10-CM

## 2020-11-13 ENCOUNTER — Other Ambulatory Visit: Payer: Self-pay

## 2020-11-13 ENCOUNTER — Other Ambulatory Visit: Payer: Medicare Other

## 2020-11-13 DIAGNOSIS — E118 Type 2 diabetes mellitus with unspecified complications: Secondary | ICD-10-CM | POA: Diagnosis not present

## 2020-11-13 DIAGNOSIS — I10 Essential (primary) hypertension: Secondary | ICD-10-CM

## 2020-11-13 DIAGNOSIS — E0590 Thyrotoxicosis, unspecified without thyrotoxic crisis or storm: Secondary | ICD-10-CM

## 2020-11-13 DIAGNOSIS — I5032 Chronic diastolic (congestive) heart failure: Secondary | ICD-10-CM | POA: Diagnosis not present

## 2020-11-13 DIAGNOSIS — I4819 Other persistent atrial fibrillation: Secondary | ICD-10-CM

## 2020-11-13 DIAGNOSIS — K766 Portal hypertension: Secondary | ICD-10-CM | POA: Diagnosis not present

## 2020-11-14 LAB — COMPLETE METABOLIC PANEL WITH GFR
AG Ratio: 1.6 (calc) (ref 1.0–2.5)
ALT: 9 U/L (ref 6–29)
AST: 15 U/L (ref 10–35)
Albumin: 4.2 g/dL (ref 3.6–5.1)
Alkaline phosphatase (APISO): 99 U/L (ref 37–153)
BUN/Creatinine Ratio: 13 (calc) (ref 6–22)
BUN: 16 mg/dL (ref 7–25)
CO2: 27 mmol/L (ref 20–32)
Calcium: 9.5 mg/dL (ref 8.6–10.4)
Chloride: 101 mmol/L (ref 98–110)
Creat: 1.22 mg/dL — ABNORMAL HIGH (ref 0.60–0.93)
GFR, Est African American: 50 mL/min/{1.73_m2} — ABNORMAL LOW (ref >=60)
GFR, Est Non African American: 43 mL/min/{1.73_m2} — ABNORMAL LOW (ref >=60)
Globulin: 2.6 g/dL (calc) (ref 1.9–3.7)
Glucose, Bld: 99 mg/dL (ref 65–99)
Potassium: 4.3 mmol/L (ref 3.5–5.3)
Sodium: 139 mmol/L (ref 135–146)
Total Bilirubin: 1.6 mg/dL — ABNORMAL HIGH (ref 0.2–1.2)
Total Protein: 6.8 g/dL (ref 6.1–8.1)

## 2020-11-14 LAB — CBC WITH DIFFERENTIAL/PLATELET
Absolute Monocytes: 496 cells/uL (ref 200–950)
Basophils Absolute: 32 cells/uL (ref 0–200)
Basophils Relative: 0.4 %
Eosinophils Absolute: 128 cells/uL (ref 15–500)
Eosinophils Relative: 1.6 %
HCT: 42 % (ref 35.0–45.0)
Hemoglobin: 13.3 g/dL (ref 11.7–15.5)
Lymphs Abs: 1192 cells/uL (ref 850–3900)
MCH: 28.6 pg (ref 27.0–33.0)
MCHC: 31.7 g/dL — ABNORMAL LOW (ref 32.0–36.0)
MCV: 90.3 fL (ref 80.0–100.0)
MPV: 11.6 fL (ref 7.5–12.5)
Monocytes Relative: 6.2 %
Neutro Abs: 6152 cells/uL (ref 1500–7800)
Neutrophils Relative %: 76.9 %
Platelets: 289 10*3/uL (ref 140–400)
RBC: 4.65 10*6/uL (ref 3.80–5.10)
RDW: 12.7 % (ref 11.0–15.0)
Total Lymphocyte: 14.9 %
WBC: 8 10*3/uL (ref 3.8–10.8)

## 2020-11-14 LAB — LIPID PANEL
Cholesterol: 125 mg/dL (ref <200)
HDL: 50 mg/dL (ref >=50)
LDL Cholesterol (Calc): 60 mg/dL (calc)
Non-HDL Cholesterol (Calc): 75 mg/dL (calc) (ref <130)
Total CHOL/HDL Ratio: 2.5 (calc) (ref <5.0)
Triglycerides: 70 mg/dL (ref <150)

## 2020-11-14 LAB — TSH: TSH: 3.81 mIU/L (ref 0.40–4.50)

## 2020-11-14 LAB — HEMOGLOBIN A1C
Hgb A1c MFr Bld: 6.2 % of total Hgb — ABNORMAL HIGH (ref <5.7)
Mean Plasma Glucose: 131 mg/dL
eAG (mmol/L): 7.3 mmol/L

## 2020-11-15 ENCOUNTER — Encounter: Payer: Self-pay | Admitting: Family Medicine

## 2020-11-15 ENCOUNTER — Ambulatory Visit (INDEPENDENT_AMBULATORY_CARE_PROVIDER_SITE_OTHER): Payer: Medicare Other | Admitting: Family Medicine

## 2020-11-15 ENCOUNTER — Other Ambulatory Visit: Payer: Self-pay

## 2020-11-15 VITALS — BP 116/58 | Wt 124.2 lb

## 2020-11-15 DIAGNOSIS — E118 Type 2 diabetes mellitus with unspecified complications: Secondary | ICD-10-CM

## 2020-11-15 DIAGNOSIS — I2510 Atherosclerotic heart disease of native coronary artery without angina pectoris: Secondary | ICD-10-CM | POA: Diagnosis not present

## 2020-11-15 DIAGNOSIS — I10 Essential (primary) hypertension: Secondary | ICD-10-CM

## 2020-11-15 DIAGNOSIS — E0590 Thyrotoxicosis, unspecified without thyrotoxic crisis or storm: Secondary | ICD-10-CM

## 2020-11-15 MED ORDER — SPIRONOLACTONE 25 MG PO TABS: 1.0000 | ORAL_TABLET | Freq: Every day | ORAL | 1 refills | Status: DC

## 2020-11-15 NOTE — Patient Instructions (Signed)
Overall, I am pleased with your exam and lab work today. We will add on thyroid test as we discussed and we will notify you of the result.  Should also be available on my chart in a few days

## 2020-11-15 NOTE — Progress Notes (Signed)
Provider:  Alain Honey, MD  Careteam: Patient Care Team: Wardell Honour, MD as PCP - General (Family Medicine) Croitoru, Dani Gobble, MD as PCP - Cardiology (Cardiology) Collene Gobble, MD as Consulting Physician (Pulmonary Disease) Marica Otter, [place] (Optometry) Linda Hedges, DO as Consulting Physician (Obstetrics and Gynecology)  PLACE OF SERVICE:  [facility] [facility]  Advanced Directive information    Allergies  Allergen Reactions   Codeine Nausea And Vomiting and Nausea Only   Ketorolac Nausea And Vomiting, Nausea Only and Other (See Comments)    "pt told to never take this med"   Meperidine Nausea Only   Demerol Nausea And Vomiting   Dronedarone Other (See Comments)    Increased fluid retention   Cyclobenzaprine Nausea And Vomiting    Chief Complaint  Patient presents with   Medical Management of Chronic Issues    Patient presents today for diabetes and hypothyroidism follow-up. She reports fasting blood sugars be around low 108-112.     HPI: Patient is a 75 y.o. female no complaints today.  She basically is healthy and tries to take care of herself.  She has a history of atrial fibrillation and sees cardiologist.  She is on Pradaxa for stroke prevention as well as Lopressor for rate control. She monitors her sugars regularly and is pleased with results from [place]. She also takes methimazole for hyperthyroidism. She helps care for her husband who has some cognitive disabilities.  Review of Systems:  Review of Systems  HENT: Negative.    Eyes: Negative.   Respiratory: Negative.    Cardiovascular:  Positive for palpitations.  Musculoskeletal: Negative.   Neurological: Negative.   Psychiatric/Behavioral: Negative.    All other systems reviewed and are negative.  Past Medical History:  Diagnosis Date   Acute on chronic diastolic CHF (congestive heart failure), NYHA class 1 (HCC) 04/29/2013   Allergic rhinitis    Atrial fibrillation (HCC)    Pacemaker    Brady-tachy syndrome (HCC)    s/p STJ dual chamber PPM by Dr Sallyanne Kuster   CAD (coronary artery disease)    70% RCA stenosis, treated medically   Control of atrial fibrillation with pacemaker ([place])    Diabetes mellitus ([place])    DJD (degenerative joint disease)    Dysrhythmia    Paroxysmal Atrial Fib.   Hypertension    Hyperthyroidism    Paroxysmal atrial fibrillation (HCC)    [place]    as a child   PONV (postoperative nausea and vomiting)    Presence of permanent cardiac pacemaker    Pulmonary disease    Past Surgical History:  Procedure Laterality Date   arm surgery d/t fx Right 12   ATRIAL FIBRILLATION ABLATION  07/31/2012; 08-10-13   PVI by Dr Rayann Heman   ATRIAL FIBRILLATION ABLATION N/A 07/31/2012   Procedure: ATRIAL FIBRILLATION ABLATION;  Surgeon: Thompson Grayer, MD;  Location: [facility] CATH LAB;  Service: Cardiovascular;  Laterality: N/A;   ATRIAL FIBRILLATION ABLATION N/A 08/10/2013   Procedure: ATRIAL FIBRILLATION ABLATION;  Surgeon: Coralyn Mark, MD;  Location: [facility] CATH LAB;  Service: Cardiovascular;  Laterality: N/A;   BACK SURGERY  07/26/2012   dR. [facility]   CARDIAC CATHETERIZATION  12/03/2010   single vessel mid RCA   CARDIAC ELECTROPHYSIOLOGY MAPPING AND ABLATION  07/31/2012   Dr. Thompson Grayer   CESAREAN SECTION  09/20/1974   Dr. Gertie Fey   CHOLECYSTECTOMY  1994   COLONOSCOPY WITH PROPOFOL N/A 03/21/2015   Procedure: COLONOSCOPY WITH PROPOFOL;  Surgeon: Juanita Craver, MD;  Location: [facility] ENDOSCOPY;  Service: Endoscopy;  Laterality: N/A;   DIAGNOSTIC MAMMOGRAM  04/16/2017   Hedwig Morton. Morris, DO   LAPAROSCOPIC HYSTERECTOMY  Late 56s to early 30s   LEFT HEART CATH AND CORONARY ANGIOGRAPHY N/A 07/30/2017   Procedure: LEFT HEART CATH AND CORONARY ANGIOGRAPHY;  Surgeon: Troy Sine, MD;  Location: [facility] CV LAB;  Service: Cardiovascular;  Laterality: N/A;   LUMBAR LAMINECTOMY/DECOMPRESSION MICRODISCECTOMY Right 12/23/2012   Procedure: Right L5-S1 Laminotomy for resection of synovial  cyst;  Surgeon: Hosie Spangle, MD;  Location: [facility] NEURO ORS;  Service: Neurosurgery;  Laterality: Right;  Right Lumbar five-sacral one Laminotomy for resection of synovial cyst   NM MYOCAR PERF WALL MOTION  12/20/2010   normal   PACEMAKER IMPLANT N/A 08/09/2019   Procedure: PACEMAKER IMPLANT;  Surgeon: Sanda Klein, MD;  Location: [facility] CV LAB;  Service: Cardiovascular;  Laterality: N/A;   PACEMAKER INSERTION  12/03/10   SJM Accent DR RF implanted by Dr Sallyanne Kuster   RIGHT HEART CATH N/A 04/22/2018   Procedure: RIGHT HEART CATH;  Surgeon: Wellington Hampshire, MD;  Location: [facility] CV LAB;  Service: Cardiovascular;  Laterality: N/A;   TEE WITHOUT CARDIOVERSION N/A 07/30/2012   Procedure: TRANSESOPHAGEAL ECHOCARDIOGRAM (TEE);  Surgeon: Sanda Klein, MD;  Location: [facility] ENDOSCOPY;  Service: Cardiovascular;  Laterality: N/A;  h&p in file-[place]   TEE WITHOUT CARDIOVERSION N/A 08/09/2013   Procedure: TRANSESOPHAGEAL ECHOCARDIOGRAM (TEE);  Surgeon: Lelon Perla, MD;  Location: [facility] ENDOSCOPY;  Service: Cardiovascular;  Laterality: N/A;   TOTAL ABDOMINAL HYSTERECTOMY  1981   ULTRASOUND GUIDANCE FOR VASCULAR ACCESS  04/22/2018   Procedure: Ultrasound Guidance For Vascular Access;  Surgeon: Wellington Hampshire, MD;  Location: [facility] CV LAB;  Service: Cardiovascular;;   Social History:   reports that she has never smoked. She has never used smokeless tobacco. She reports that she does not drink alcohol and does not use drugs.  Family History  Problem Relation Age of Onset   Cancer Brother    Heart attack Sister    Breast cancer Sister    Diabetes Sister    Heart disease Sister    Breast cancer Sister    Dementia Sister    Colon cancer Sister    Heart disease Sister    Breast cancer Sister    Suicidality Brother    Prostate cancer Brother    Dementia Brother     Medications: Patient's Medications  New Prescriptions   No medications on file  Previous Medications   CHOLECALCIFEROL  (VITAMIN D3) 1000 UNITS CAPS    Take 1,000 Units by mouth daily.   CYANOCOBALAMIN (VITAMIN B12 PO)    Take 1 tablet by mouth daily as needed (for energy).    FUROSEMIDE (LASIX) 80 MG TABLET    Take 1 tablet by mouth once daily   GLUCOSE BLOOD (ONETOUCH ULTRA) TEST STRIP    USE DAILY TO CHECK BLOOD SUGAR E11.8   JANUVIA 100 MG TABLET    Take 1 tablet by mouth once daily   LANCETS MISC. (ONE TOUCH SURESOFT) MISC    Use to test  Blood sugar once daily DX: E11.8,   LOPERAMIDE (IMODIUM) 2 MG CAPSULE    Take 2 mg by mouth as needed for diarrhea or loose stools.    METHIMAZOLE (TAPAZOLE) 10 MG TABLET    TAKE 1 TABLET BY MOUTH ONCE DAILY WITH FOOD FOR THYROID   METOPROLOL TARTRATE (LOPRESSOR) 50 MG TABLET    Take 50  mg in the morning and 25 mg (half a tablet) in the evening   OXYBUTYNIN (DITROPAN) 5 MG TABLET    Take 1 tablet by mouth once daily   PANTOPRAZOLE (PROTONIX) 40 MG TABLET    Take 1 tablet by mouth once daily   POTASSIUM CHLORIDE SA (KLOR-CON) 20 MEQ TABLET    TAKE 1  BY MOUTH ONCE DAILY   PRADAXA 150 MG CAPS CAPSULE    Take 1 capsule by mouth twice daily   SILDENAFIL (REVATIO) 20 MG TABLET    TAKE 1 TABLET BY MOUTH THREE TIMES DAILY   SPIRONOLACTONE (ALDACTONE) 25 MG TABLET    Take 1 tablet by mouth once daily  Modified Medications   No medications on file  Discontinued Medications   No medications on file    Physical Exam:  Vitals:   11/15/20 1028  BP: (!) 116/58  Weight: 124 lb 3.2 oz (56.3 kg)   Body mass index is 25.09 kg/m. Wt Readings from Last 3 Encounters:  11/15/20 124 lb 3.2 oz (56.3 kg)  07/03/20 137 lb 6.4 oz (62.3 kg)  03/27/20 134 lb 12.8 oz (61.1 kg)    Physical Exam Vitals and nursing note reviewed.  Constitutional:      Appearance: Normal appearance.  Cardiovascular:     Rate and Rhythm: Normal rate and regular rhythm.     Comments: Heart rhythm is regular today.  I suspect he is in a sinus rhythm Pulmonary:     Effort: Pulmonary effort is normal.      Breath sounds: Normal breath sounds.  Musculoskeletal:        General: Normal range of motion.     Cervical back: Normal range of motion.  Skin:    General: Skin is warm.  Neurological:     Mental Status: She is alert.    Labs reviewed: Basic Metabolic Panel: Recent Labs    11/18/19 0850 03/23/20 0914 11/13/20 0955  NA 140 141 139  K 3.9 4.2 4.3  CL 100 101 101  CO2 31 28 27   GLUCOSE 120* 100* 99  BUN 20 17 16   CREATININE 1.20* 1.27* 1.22*  CALCIUM 9.2 9.3 9.5  TSH  --   --  3.81   Liver Function Tests: Recent Labs    11/18/19 0850 11/13/20 0955  AST 10 15  ALT 8 9  BILITOT 1.2 1.6*  PROT 6.5 6.8   No results for input(s): LIPASE, AMYLASE in the last 8760 hours. No results for input(s): AMMONIA in the last 8760 hours. CBC: Recent Labs    11/18/19 0850 11/13/20 0955  WBC 8.3 8.0  NEUTROABS 6,449 6,152  HGB 14.5 13.3  HCT 43.7 42.0  MCV 89.9 90.3  PLT 291 289   Lipid Panel: Recent Labs    11/18/19 0850 03/23/20 0914 11/13/20 0955  CHOL 142 124 125  HDL 49* 55 50  LDLCALC 76 54 60  TRIG 85 66 70  CHOLHDL 2.9 2.3 2.5   TSH: Recent Labs    11/13/20 0955  TSH 3.81   A1C: Lab Results  Component Value Date   HGBA1C 6.2 (H) 11/13/2020     Assessment/Plan  1. Essential hypertension Blood pressure is good at 116/58.  She had no that she is not on ACE or ARB  2. Controlled type 2 diabetes mellitus with complication, without long-term current use of insulin (HCC) Hemoglobin A1c is 6.2 on Januvia   3. Hyperthyroidism TSH today is within therapeutic range.  Continue with same  dose of methimazole  Alain Honey, MD [facility] [phone]

## 2020-11-27 ENCOUNTER — Other Ambulatory Visit: Payer: Self-pay | Admitting: Family Medicine

## 2020-11-27 DIAGNOSIS — E118 Type 2 diabetes mellitus with unspecified complications: Secondary | ICD-10-CM

## 2020-12-01 NOTE — Progress Notes (Signed)
Remote pacemaker transmission.   

## 2020-12-07 ENCOUNTER — Other Ambulatory Visit: Payer: Self-pay | Admitting: Cardiovascular Disease

## 2020-12-26 ENCOUNTER — Other Ambulatory Visit: Payer: Self-pay | Admitting: Family Medicine

## 2020-12-26 DIAGNOSIS — N3281 Overactive bladder: Secondary | ICD-10-CM

## 2021-01-29 ENCOUNTER — Ambulatory Visit (INDEPENDENT_AMBULATORY_CARE_PROVIDER_SITE_OTHER): Payer: Medicare Other | Admitting: Cardiovascular Disease

## 2021-01-29 ENCOUNTER — Other Ambulatory Visit: Payer: Self-pay

## 2021-01-29 ENCOUNTER — Encounter: Payer: Self-pay | Admitting: Cardiovascular Disease

## 2021-01-29 VITALS — BP 116/58 | HR 60 | Ht 59.0 in | Wt 131.8 lb

## 2021-01-29 DIAGNOSIS — Z950 Presence of cardiac pacemaker: Secondary | ICD-10-CM | POA: Diagnosis not present

## 2021-01-29 DIAGNOSIS — I5032 Chronic diastolic (congestive) heart failure: Secondary | ICD-10-CM | POA: Diagnosis not present

## 2021-01-29 DIAGNOSIS — I2510 Atherosclerotic heart disease of native coronary artery without angina pectoris: Secondary | ICD-10-CM | POA: Diagnosis not present

## 2021-01-29 DIAGNOSIS — E118 Type 2 diabetes mellitus with unspecified complications: Secondary | ICD-10-CM

## 2021-01-29 DIAGNOSIS — Z7901 Long term (current) use of anticoagulants: Secondary | ICD-10-CM

## 2021-01-29 DIAGNOSIS — I441 Atrioventricular block, second degree: Secondary | ICD-10-CM

## 2021-01-29 DIAGNOSIS — I2721 Secondary pulmonary arterial hypertension: Secondary | ICD-10-CM | POA: Diagnosis not present

## 2021-01-29 DIAGNOSIS — I4811 Longstanding persistent atrial fibrillation: Secondary | ICD-10-CM

## 2021-01-29 NOTE — Progress Notes (Signed)
Patient ID: Hailey [name], female   DOB: 1945/11/19, 75 y.o.   MRN: [id number]    Cardiology Office Note    Date:  01/30/2021   ID:  Hailey [name], Hailey [name] 03-Mar-1946, MRN [id number]  PCP:  Wardell Honour, MD  Cardiologist:  Thompson Grayer, MD;  Sanda Klein, MD   No chief complaint on file. Atrial fibrillation  History of Present Illness:  Hailey [name] is a 75 y.o. female with longstanding persistent atrial fibrillation (s/p RF ablation procedures),s/p dual-chamber permanent pacemaker ([employer] Accent 2012 for tachycardia-bradycardia syndrome and 2:1 atrioventricular block, generator change and new right ventricular lead for high pacing thresholds March 2021), chronic diastolic heart failure (history of acute decompensation on Multaq), multifactorial pulmonary artery hypertension, CAD (nonobstructive by cath February 2019, type 2 diabetes mellitus, hypertension, good lipid profile without therapy, hyperthyroidism (on propylthiouracil).  She is doing quite well.  She has had no new health problems.  Biggest challenge remains the decline in cognitive abilities of her husband Hailey [name].  They continue to live independently, with help from her daughters.  She still takes care of most of her own housework, but has to spread out over longer periods of time due to fatigue and shortness of breath.  NYHA functional class II.  The patient specifically denies any chest pain at rest or with light exertion, dyspnea at rest or with exertion, orthopnea, paroxysmal nocturnal dyspnea, syncope, palpitations, focal neurological deficits, intermittent claudication, lower extremity edema, unexplained weight gain, cough, hemoptysis or wheezing.  She has not had any falls or bleeding problems.  She remains in atrial fibrillation today with good ventricular rate control.  Interrogation of her pacemaker shows that she has 94% ventricular pacing, with underlying atrial fibrillation with slow ventricular response at 45-50  bpm.  There have been no episodes of high ventricular rate.  Lead parameters are normal.  Estimated generator longevity is 10-11 years.  Her paced QRS complex is quite narrow.  Echocardiogram July 2020 showed normal left ventricular systolic function and EF of 60-65%, pseudonormal mitral inflow, moderately enlarged right ventricle, biatrial mild dilation, estimated systolic PA pressure 40 mmHg.  Previous right heart catheterization showed that her pulmonary hypertension could not be explained exclusively based on elevated left heart filling pressures.  She was evaluated in the rheumatology clinic by Dr. Amil Amen since she had an elevated ANA of 1: 640, but all her other serological markers were negative for lupus, rheumatoid arthritis or other connective tissue diseases.  Chest CT has shown numerous bilateral pulmonary nodules felt to be nonspecific.  These have been present back as far as 2012.  Repeat pulmonary function tests have been planned in June, but have not been performed.  She has a large goiter and is on chronic antithyroid medications.  Past Medical History:  Diagnosis Date   Acute on chronic diastolic CHF (congestive heart failure), NYHA class 1 (HCC) 04/29/2013   Allergic rhinitis    Atrial fibrillation (HCC)    Pacemaker   Brady-tachy syndrome (HCC)    s/p STJ dual chamber PPM by Dr Sallyanne Kuster   CAD (coronary artery disease)    70% RCA stenosis, treated medically   Control of atrial fibrillation with pacemaker ([place])    Diabetes mellitus ([place])    DJD (degenerative joint disease)    Dysrhythmia    Paroxysmal Atrial Fib.   Hypertension    Hyperthyroidism    Paroxysmal atrial fibrillation (HCC)    Polio    as a child   PONV (  postoperative nausea and vomiting)    Presence of permanent cardiac pacemaker    Pulmonary disease     Past Surgical History:  Procedure Laterality Date   arm surgery d/t fx Right 96   ATRIAL FIBRILLATION ABLATION  07/31/2012; 08-10-13   PVI by Dr  Rayann Heman   ATRIAL FIBRILLATION ABLATION N/A 07/31/2012   Procedure: ATRIAL FIBRILLATION ABLATION;  Surgeon: Thompson Grayer, MD;  Location: [facility] CATH LAB;  Service: Cardiovascular;  Laterality: N/A;   ATRIAL FIBRILLATION ABLATION N/A 08/10/2013   Procedure: ATRIAL FIBRILLATION ABLATION;  Surgeon: Coralyn Mark, MD;  Location: [place] [facility] CATH LAB;  Service: Cardiovascular;  Laterality: N/A;   BACK SURGERY  07/26/2012   [facility]   CARDIAC CATHETERIZATION  12/03/2010   single vessel mid RCA   CARDIAC ELECTROPHYSIOLOGY MAPPING AND ABLATION  07/31/2012   Dr. Thompson Grayer   CESAREAN SECTION  09/20/1974   Dr. Gertie Fey   CHOLECYSTECTOMY  1994   COLONOSCOPY WITH PROPOFOL N/A 03/21/2015   Procedure: COLONOSCOPY WITH PROPOFOL;  Surgeon: Juanita Craver, MD;  Location: [facility] ENDOSCOPY;  Service: Endoscopy;  Laterality: N/A;   DIAGNOSTIC MAMMOGRAM  04/16/2017   Hedwig Morton. Morris, DO   LAPAROSCOPIC HYSTERECTOMY  Late 92s to early 35s   LEFT HEART CATH AND CORONARY ANGIOGRAPHY N/A 07/30/2017   Procedure: LEFT HEART CATH AND CORONARY ANGIOGRAPHY;  Surgeon: Troy Sine, MD;  Location: [place] CV LAB;  Service: Cardiovascular;  Laterality: N/A;   LUMBAR LAMINECTOMY/DECOMPRESSION MICRODISCECTOMY Right 12/23/2012   Procedure: Right L5-S1 Laminotomy for resection of synovial cyst;  Surgeon: Hosie Spangle, MD;  Location: [place] NEURO ORS;  Service: Neurosurgery;  Laterality: Right;  Right Lumbar five-sacral one Laminotomy for resection of synovial cyst   NM MYOCAR PERF WALL MOTION  12/20/2010   normal   PACEMAKER IMPLANT N/A 08/09/2019   Procedure: PACEMAKER IMPLANT;  Surgeon: Sanda Klein, MD;  Location: [place] CV LAB;  Service: Cardiovascular;  Laterality: N/A;   PACEMAKER INSERTION  12/03/10   SJM Accent DR RF implanted by Dr Sallyanne Kuster   RIGHT HEART CATH N/A 04/22/2018   Procedure: RIGHT HEART CATH;  Surgeon: Wellington Hampshire, MD;  Location: [place] CV LAB;  Service: Cardiovascular;  Laterality: N/A;   TEE WITHOUT  CARDIOVERSION N/A 07/30/2012   Procedure: TRANSESOPHAGEAL ECHOCARDIOGRAM (TEE);  Surgeon: Sanda Klein, MD;  Location: [facility] ENDOSCOPY;  Service: Cardiovascular;  Laterality: N/A;  h&p in file-hope   TEE WITHOUT CARDIOVERSION N/A 08/09/2013   Procedure: TRANSESOPHAGEAL ECHOCARDIOGRAM (TEE);  Surgeon: Lelon Perla, MD;  Location: [facility] ENDOSCOPY;  Service: Cardiovascular;  Laterality: N/A;   TOTAL ABDOMINAL HYSTERECTOMY  1981   ULTRASOUND GUIDANCE FOR VASCULAR ACCESS  04/22/2018   Procedure: Ultrasound Guidance For Vascular Access;  Surgeon: Wellington Hampshire, MD;  Location: [place] CV LAB;  Service: Cardiovascular;;    Current Medications: Outpatient Medications Prior to Visit  Medication Sig Dispense Refill   Cholecalciferol (VITAMIN D3) 1000 UNITS CAPS Take 1,000 Units by mouth daily.     Cyanocobalamin (VITAMIN B12 PO) Take 1 tablet by mouth daily as needed (for energy).      furosemide (LASIX) 80 MG tablet Take 1 tablet by mouth once daily 90 tablet 3   glucose blood (ONETOUCH ULTRA) test strip USE DAILY TO CHECK BLOOD SUGAR E11.8 100 each 11   JANUVIA 100 MG tablet Take 1 tablet by mouth once daily 90 tablet 0   Lancets Misc. (ONE TOUCH SURESOFT) MISC Use to test  Blood sugar once daily DX:  E11.8, 1 each 0   loperamide (IMODIUM) 2 MG capsule Take 2 mg by mouth as needed for diarrhea or loose stools.      methimazole (TAPAZOLE) 10 MG tablet TAKE 1 TABLET BY MOUTH ONCE DAILY WITH FOOD FOR THYROID 90 tablet 3   metoprolol tartrate (LOPRESSOR) 50 MG tablet Take 50 mg in the morning and 25 mg (half a tablet) in the evening 90 tablet 3   oxybutynin (DITROPAN) 5 MG tablet Take 1 tablet by mouth once daily 90 tablet 0   pantoprazole (PROTONIX) 40 MG tablet Take 1 tablet by mouth once daily 90 tablet 0   potassium chloride SA (KLOR-CON) 20 MEQ tablet TAKE 1  BY MOUTH ONCE DAILY 90 tablet 3   PRADAXA 150 MG CAPS capsule Take 1 capsule by mouth twice daily 180 capsule 0   sildenafil (REVATIO) 20  MG tablet TAKE 1 TABLET BY MOUTH THREE TIMES DAILY 270 tablet 3   spironolactone (ALDACTONE) 25 MG tablet Take 1 tablet (25 mg total) by mouth daily. 90 tablet 1   No facility-administered medications prior to visit.     Allergies:   Codeine, Ketorolac, Meperidine, Demerol, Dronedarone, and Cyclobenzaprine   Social History   Socioeconomic History   Marital status: Married    Spouse name: Hailey [name]   Number of children: 3   Years of education: 13   Highest education level: Some college, no degree  Occupational History   Occupation: housewife  Tobacco Use   Smoking status: Never   Smokeless tobacco: Never  Vaping Use   Vaping Use: Never used  Substance and Sexual Activity   Alcohol use: No   Drug use: No   Sexual activity: Not Currently    Birth control/protection: Abstinence  Other Topics Concern   Not on file  Social History Narrative   Lives in [place].  Retired.  Was office clerk in several companies.  Did some technical school after completing high school.  She has no pets.  She and her husband live in a one-story home.  They've been married since 1965.  She follows a heart healthy diet.  She has a living will and hcpoa but does not have a DNR form.  She does struggle to prepare food.     Social Determinants of Health   Financial Resource Strain: Not on file  Food Insecurity: Not on file  Transportation Needs: Not on file  Physical Activity: Not on file  Stress: Not on file  Social Connections: Not on file     Family History:  The patient's family history includes Breast cancer in her sister, sister, and sister; Cancer in her brother; Colon cancer in her sister; Dementia in her brother and sister; Diabetes in her sister; Heart attack in her sister; Heart disease in her sister and sister; Prostate cancer in her brother; Suicidality in her brother.   ROS:   Please see the history of present illness.    All other systems are reviewed and are negative.   PHYSICAL  EXAM:   VS:  BP (!) 116/58 (BP Location: Left Arm, Patient Position: Sitting, Cuff Size: Normal)   Pulse 60   Ht '4\' 11"'$  (1.499 m)   Wt 131 lb 12.8 oz (59.8 kg)   SpO2 98%   BMI 26.62 kg/m      General: Alert, oriented x3, no distress, minimally overweight.  Well-healed left subclavian pacemaker site. Head: no evidence of trauma, PERRL, EOMI, no exophtalmos or lid lag, no myxedema, no xanthelasma;  normal ears, nose and oropharynx Neck: normal jugular venous pulsations and no hepatojugular reflux; brisk carotid pulses without delay and no carotid bruits Chest: clear to auscultation, no signs of consolidation by percussion or palpation, normal fremitus, symmetrical and full respiratory excursions Cardiovascular: normal position and quality of the apical impulse, regular rhythm, normal first and paradoxically split second heart sounds, no murmurs, rubs or gallops Abdomen: no tenderness or distention, no masses by palpation, no abnormal pulsatility or arterial bruits, normal bowel sounds, no hepatosplenomegaly Extremities: no clubbing, cyanosis or edema; 2+ radial, ulnar and brachial pulses bilaterally; 2+ right femoral, posterior tibial and dorsalis pedis pulses; 2+ left femoral, posterior tibial and dorsalis pedis pulses; no subclavian or femoral bruits Neurological: grossly nonfocal Psych: Normal mood and affect     Wt Readings from Last 3 Encounters:  01/29/21 131 lb 12.8 oz (59.8 kg)  11/15/20 124 lb 3.2 oz (56.3 kg)  07/03/20 137 lb 6.4 oz (62.3 kg)      Studies/Labs Reviewed:   Right heart catheterization April 22, 2018 1.  Mildly elevated left-sided filling pressures. 2.  Moderate to severe pulmonary hypertension with normal cardiac output.   Pulmonary capillary wedge pressure: 19 mmHg. PA pressure: 68/28 with a mean of 43 mmHg. Pulmonary vascular resistance: 4.9 Woods units Cardiac index was 2.97.   Recommendations: The patient seems to have mixed pulmonary  hypertension due to left-sided heart failure and primary pulmonary hypertension.  She is mildly volume overloaded and I suspect that she can switch to oral furosemide tomorrow. Consider vasodilator therapy for pulmonary hypertension.   Left heart catheterization July 30, 2017   Ost 1st Diag to 1st Diag lesion is 10% stenosed. Mid RCA lesion is 20% stenosed. The left ventricular systolic function is normal. LV end diastolic pressure is normal.   No significant coronary obstructive disease with mild smooth 10% narrowing in the proximal to midportion of the first diagonal branch of the LAD; normal left circumflex coronary artery; and smooth 20% mid RCA narrowing.   Normal LV function without focal segmental wall motion abnormalities.  LVEDP 7.   RECOMMENDATION: Medical therapy.   Echo April 20, 2018     - Left ventricle: The cavity size was normal. Systolic function was   normal. The estimated ejection fraction was in the range of 55%   to 60%. Wall motion was normal; there were no regional wall   motion abnormalities. There was a reduced contribution of atrial   contraction to ventricular filling, due to increased ventricular   diastolic pressure or atrial contractile dysfunction. Features   are consistent with a pseudonormal left ventricular filling   pattern, with concomitant abnormal relaxation and increased   filling pressure (grade 2 diastolic dysfunction). - Left atrium: The atrium was mildly dilated. - Right ventricle: The cavity size was mildly dilated. Wall   thickness was normal. - Right atrium: The atrium was mildly dilated. - Tricuspid valve: There was mild-moderate regurgitation directed   centrally. - Pulmonary arteries: Systolic pressure was moderately to severely   increased. PA peak pressure: 66 mm Hg (S).   Impressions:   - PA pressure has increased since the previous study.       EKG:  EKG is ordered today and personally reviewed and shows atrial  fibrillation with ventricular pacing.  Lipid Panel    Component Value Date/Time   CHOL 125 11/13/2020 0955   TRIG 70 11/13/2020 0955   HDL 50 11/13/2020 0955   CHOLHDL 2.5 11/13/2020 0955   VLDL  10 11/24/2010 0837   LDLCALC 60 11/13/2020 0955   BMET    Component Value Date/Time   NA 139 11/13/2020 0955   NA 138 08/05/2019 1416   K 4.3 11/13/2020 0955   CL 101 11/13/2020 0955   CO2 27 11/13/2020 0955   GLUCOSE 99 11/13/2020 0955   BUN 16 11/13/2020 0955   BUN 20 08/05/2019 1416   CREATININE 1.22 (H) 11/13/2020 0955   CALCIUM 9.5 11/13/2020 0955   GFRNONAA 43 (L) 11/13/2020 0955   GFRAA 50 (L) 11/13/2020 0955   Hemoglobin A1c 6.2% on 11/13/2020 ASSESSMENT:    1. Chronic diastolic heart failure ([place])   2. PAH (pulmonary artery hypertension) ([place])   3. Longstanding persistent atrial fibrillation ([place])   4. Long term current use of anticoagulant   5. Second degree AV block   6. Pacemaker   7. Coronary artery disease involving native coronary artery of native heart without angina pectoris   8. Controlled type 2 diabetes mellitus with complication, without long-term current use of insulin (HCC)       PLAN:  In order of problems listed above:   CHF: Clinically euvolemic with NYHA functional class II.  Despite almost 100% ventricular pacing, there has been no worsening of her heart failure status. PAH: No change in clinical status.  This is felt to be multifactorial, partly due to diastolic left heart failure, but partly due to intrinsic arteriolar lung disease, with some equivocal evidence of interstitial lung disease and autoimmune findings.  Seems to be tolerating sildenafil very well. AFib: Previously had ablation procedures and took antiarrhythmics, now in permanent atrial fibrillation.  Very well rate controlled, in fact with a high burden of ventricular pacing.  It is quite interesting that when we check ventricular pacing thresholds are the underlying rhythm she does  not feel as well as when she has ventricular pacing. Compliant with anticoagulation.  CHADSVasc at least 5 (age, gender, DM, CAD, history of CHF). Anticoagulation: No injuries or bleeding. 2nd deg AVB: She feels best when she has ventricular pacing.  She felt extremely poorly when she lost ventricular capture in the past and she feels unwell when we check underlying rhythm more ventricular pacing thresholds. SSS: Precedes diagnosis of persistent atrial fibrillation and was the initial reason for device implantation. PPM: Received a new generator and a new right ventricular lead in 2020.  Normal device function.  Continue remote downloads every 3 months. CAD: Denies angina pectoris. Nonobstructive disease by cath in February 2019.  Most recent lipid profile showed HDL 50 and LDL 60 DM: hemoglobin A1c was 6.2%.  Medication Adjustments/Labs and Tests Ordered: Current medicines are reviewed at length with the patient today.  Concerns regarding medicines are outlined above.  Medication changes, Labs and Tests ordered today are listed in the Patient Instructions below.   Patient Instructions  Medication Instructions:  No Changes In Medications at this time.  *If you need a refill on your cardiac medications before your next appointment, please call your pharmacy*  Follow-Up: At [facility], you and your health needs are our priority.  As part of our continuing mission to provide you with exceptional heart care, we have created designated Provider Care Teams.  These Care Teams include your primary Cardiologist (physician) and Advanced Practice Providers (APPs -  Physician Assistants and Nurse Practitioners) who all work together to provide you with the care you need, when you need it.  Your next appointment:   FEBRUARY 6th at 10:20AM  The format  for your next appointment:   In Person  Provider:   Sanda Klein, MD   Signed, Sanda Klein, MD  01/30/2021 8:04 PM    [address], [place], [place]  [address] Phone: [phone]; Fax: [phone]

## 2021-01-29 NOTE — Patient Instructions (Signed)
Medication Instructions:  No Changes In Medications at this time.  *If you need a refill on your cardiac medications before your next appointment, please call your pharmacy*  Follow-Up: At [facility], you and your health needs are our priority.  As part of our continuing mission to provide you with exceptional heart care, we have created designated Provider Care Teams.  These Care Teams include your primary Cardiologist (physician) and Advanced Practice Providers (APPs -  Physician Assistants and Nurse Practitioners) who all work together to provide you with the care you need, when you need it.  Your next appointment:   FEBRUARY 6th at 10:20AM  The format for your next appointment:   In Person  Provider:   Sanda Klein, MD

## 2021-02-07 ENCOUNTER — Ambulatory Visit (INDEPENDENT_AMBULATORY_CARE_PROVIDER_SITE_OTHER): Payer: Medicare Other

## 2021-02-07 DIAGNOSIS — I495 Sick sinus syndrome: Secondary | ICD-10-CM

## 2021-02-07 LAB — CUP PACEART REMOTE DEVICE CHECK
Battery Remaining Longevity: 127 mo
Battery Remaining Percentage: 89 %
Battery Voltage: 3.02 V
Brady Statistic RV Percent Paced: 95 %
Date Time Interrogation Session: 20220907040018
Implantable Lead Implant Date: 20120702
Implantable Lead Implant Date: 20120702
Implantable Lead Implant Date: 20210308
Implantable Lead Location: 753859
Implantable Lead Location: 753860
Implantable Lead Location: 753860
Implantable Pulse Generator Implant Date: 20210308
Lead Channel Impedance Value: 660 Ohm
Lead Channel Pacing Threshold Amplitude: 0.5 V
Lead Channel Pacing Threshold Pulse Width: 0.5 ms
Lead Channel Sensing Intrinsic Amplitude: 11.8 mV
Lead Channel Setting Pacing Amplitude: 0.75 V
Lead Channel Setting Pacing Pulse Width: 0.5 ms
Lead Channel Setting Sensing Sensitivity: 2 mV
Pulse Gen Model: 2272
Pulse Gen Serial Number: 3801383

## 2021-02-15 NOTE — Progress Notes (Signed)
Remote pacemaker transmission.   

## 2021-03-13 ENCOUNTER — Other Ambulatory Visit: Payer: Self-pay | Admitting: Cardiovascular Disease

## 2021-03-14 ENCOUNTER — Other Ambulatory Visit: Payer: Self-pay

## 2021-03-14 MED ORDER — DABIGATRAN ETEXILATE MESYLATE 150 MG PO CAPS: 150.0000 mg | ORAL_CAPSULE | Freq: Two times a day (BID) | ORAL | 3 refills | Status: DC

## 2021-03-14 MED ORDER — FUROSEMIDE 80 MG PO TABS: 80.0000 mg | ORAL_TABLET | Freq: Every day | ORAL | 3 refills | Status: DC

## 2021-03-19 ENCOUNTER — Telehealth: Payer: Self-pay | Admitting: Cardiovascular Disease

## 2021-03-19 ENCOUNTER — Telehealth: Payer: Self-pay | Admitting: *Deleted

## 2021-03-19 NOTE — Telephone Encounter (Signed)
Yes pt should restart her Pradaxa especially if the bleeding has stopped, we didn't clear her to hold it and her CHADS2VASc score is 7 so she's at elevated stroke risk off of it.

## 2021-03-19 NOTE — Telephone Encounter (Signed)
Noted will also send to [facility] so he is aware

## 2021-03-19 NOTE — Telephone Encounter (Signed)
Patient called and stated that she had a crown placed on tooth last week and they gave her a steroid to take on 10/13. Stated that she had a Nose bleed that wouldn't stop on Saturday and she stopped the steroid and the Pradaxa.   She called the Cardiologist Dr. Sallyanne Kuster and his office told her to start back the Pradaxa and Steroid and finish it.  Told her to call her PCP if she had anymore Nose Bleeds. Patient has not had anymore since starting it back. Has 3 days left of the steroid.   FYI. (Forwarded to Janett Billow due to Dr. Sabra Heck out of office)

## 2021-03-19 NOTE — Telephone Encounter (Signed)
Pt c/o medication issue:  1. Name of Medication:  dabigatran (PRADAXA) 150 MG CAPS capsule Prednisone 4 MG's   2. How are you currently taking this medication (dosage and times per day)? Stopped taking Pradaxa and Prednisone on 03/17/21  3. Are you having a reaction (difficulty breathing--STAT)? Yes   4. What is your medication issue? Christia is calling stating her dentist started her on prednisone 4 MG's on 10/13 due to an issue with her tooth after getting a crown. She states on 10/15 she woke up with a nose bleed that would not stop. Due to this she stopped taking her pradaxa and prednisone to get it to stop. She is still currently not taking either and her dentist is requesting she start back taking the prednisone again. She is wanting to know if she should start back on the pradaxa as well. She states she took the prednisone 6 tablets the first day and 5 the next. On Saturday she only took 3 tablets before stopping.

## 2021-03-19 NOTE — Telephone Encounter (Signed)
Attempted to contact pt. Phone continues to ring 

## 2021-03-19 NOTE — Telephone Encounter (Signed)
Pt updated and stated she will start back taking pradaxa today.

## 2021-03-26 ENCOUNTER — Other Ambulatory Visit: Payer: Self-pay | Admitting: Family Medicine

## 2021-03-26 DIAGNOSIS — E118 Type 2 diabetes mellitus with unspecified complications: Secondary | ICD-10-CM

## 2021-04-02 ENCOUNTER — Other Ambulatory Visit: Payer: Self-pay | Admitting: Cardiovascular Disease

## 2021-04-12 ENCOUNTER — Other Ambulatory Visit: Payer: Self-pay | Admitting: Family Medicine

## 2021-04-12 DIAGNOSIS — N3281 Overactive bladder: Secondary | ICD-10-CM

## 2021-04-24 ENCOUNTER — Other Ambulatory Visit: Payer: Self-pay | Admitting: *Deleted

## 2021-04-24 MED ORDER — METHIMAZOLE 10 MG PO TABS: ORAL_TABLET | ORAL | 1 refills | Status: DC

## 2021-04-24 NOTE — Telephone Encounter (Signed)
Pharmacy requested refill

## 2021-05-04 DIAGNOSIS — Z23 Encounter for immunization: Secondary | ICD-10-CM | POA: Diagnosis not present

## 2021-05-09 ENCOUNTER — Ambulatory Visit (INDEPENDENT_AMBULATORY_CARE_PROVIDER_SITE_OTHER): Payer: Medicare Other

## 2021-05-09 DIAGNOSIS — I495 Sick sinus syndrome: Secondary | ICD-10-CM | POA: Diagnosis not present

## 2021-05-09 LAB — CUP PACEART REMOTE DEVICE CHECK
Battery Remaining Longevity: 122 mo
Battery Remaining Percentage: 87 %
Battery Voltage: 3.01 V
Brady Statistic RV Percent Paced: 95 %
Date Time Interrogation Session: 20221207040015
Implantable Lead Implant Date: 20120702
Implantable Lead Implant Date: 20120702
Implantable Lead Implant Date: 20210308
Implantable Lead Location: 753859
Implantable Lead Location: 753860
Implantable Lead Location: 753860
Implantable Pulse Generator Implant Date: 20210308
Lead Channel Impedance Value: 600 Ohm
Lead Channel Pacing Threshold Amplitude: 0.625 V
Lead Channel Pacing Threshold Pulse Width: 0.5 ms
Lead Channel Sensing Intrinsic Amplitude: 10.4 mV
Lead Channel Setting Pacing Amplitude: 0.875
Lead Channel Setting Pacing Pulse Width: 0.5 ms
Lead Channel Setting Sensing Sensitivity: 2 mV
Pulse Gen Model: 2272
Pulse Gen Serial Number: 3801383

## 2021-05-18 NOTE — Progress Notes (Signed)
Remote pacemaker transmission.   

## 2021-05-21 ENCOUNTER — Other Ambulatory Visit: Payer: Self-pay | Admitting: Family Medicine

## 2021-05-21 NOTE — Telephone Encounter (Signed)
Patient has request refill on medication "Spironolactone". Patient medication last refilled 11/15/2020. Patient medications has many High Risk Warnings. Medication pend and sent to Sherrie Mustache, NP due to PCP Sabra Heck Lillette Boxer, MD being out of office.

## 2021-05-22 ENCOUNTER — Encounter: Payer: Self-pay | Admitting: Family Medicine

## 2021-05-22 ENCOUNTER — Other Ambulatory Visit: Payer: Self-pay

## 2021-05-22 ENCOUNTER — Ambulatory Visit (INDEPENDENT_AMBULATORY_CARE_PROVIDER_SITE_OTHER): Payer: Medicare Other | Admitting: Family Medicine

## 2021-05-22 VITALS — BP 122/74 | HR 63 | Temp 96.8°F | Ht 59.0 in | Wt 129.6 lb

## 2021-05-22 DIAGNOSIS — Z23 Encounter for immunization: Secondary | ICD-10-CM | POA: Diagnosis not present

## 2021-05-22 DIAGNOSIS — E118 Type 2 diabetes mellitus with unspecified complications: Secondary | ICD-10-CM

## 2021-05-22 DIAGNOSIS — E0590 Thyrotoxicosis, unspecified without thyrotoxic crisis or storm: Secondary | ICD-10-CM

## 2021-05-22 DIAGNOSIS — R112 Nausea with vomiting, unspecified: Secondary | ICD-10-CM | POA: Diagnosis not present

## 2021-05-22 DIAGNOSIS — I10 Essential (primary) hypertension: Secondary | ICD-10-CM

## 2021-05-22 DIAGNOSIS — I2510 Atherosclerotic heart disease of native coronary artery without angina pectoris: Secondary | ICD-10-CM | POA: Diagnosis not present

## 2021-05-22 DIAGNOSIS — I4891 Unspecified atrial fibrillation: Secondary | ICD-10-CM | POA: Diagnosis not present

## 2021-05-22 DIAGNOSIS — R197 Diarrhea, unspecified: Secondary | ICD-10-CM | POA: Diagnosis not present

## 2021-05-22 MED ORDER — ONDANSETRON HCL 4 MG PO TABS: 4.0000 mg | ORAL_TABLET | Freq: Three times a day (TID) | ORAL | 0 refills | Status: DC | PRN

## 2021-05-22 NOTE — Telephone Encounter (Signed)
Daughter does not want you to share with patient that she sent this to you.   Stated that patient listens to her Dr. And felt this would help with her visit today.

## 2021-05-22 NOTE — Progress Notes (Signed)
Provider:  Alain Honey, MD  Careteam: Patient Care Team: Wardell Honour, MD as PCP - General (Family Medicine) Croitoru, Dani Gobble, MD as PCP - Cardiology (Cardiology) Collene Gobble, MD as Consulting Physician (Pulmonary Disease) Marica Otter, [place] (Optometry) Linda Hedges, DO as Consulting Physician (Obstetrics and Gynecology)  PLACE OF SERVICE:  [place]  Advanced Directive information    Allergies  Allergen Reactions   Codeine Nausea And Vomiting and Nausea Only   Ketorolac Nausea And Vomiting, Nausea Only and Other (See Comments)    "pt told to never take this med"   Meperidine Nausea Only   Demerol Nausea And Vomiting   Dronedarone Other (See Comments)    Increased fluid retention   Cyclobenzaprine Nausea And Vomiting    Chief Complaint  Patient presents with   Medical Management of Chronic Issues    Patient presents today for 6 month follow-up.   Quality Metric Gaps    Eye exam, urine microalbumin, A1C, zoster, pneumonia, flu, COVID booster     HPI: Patient is a 75 y.o. female patient is here to follow-up problems including diabetes, sick sinus syndrome hyperthyroid.  Recently had some nausea and vomiting.  She is modifying her diet to treat and seems to be improving but she has lost a few pounds. 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  Received a communication from daughter who is worried that her mother is not sleeping enough.  Also recently lost a friend who died at age 58 and that has been on her mind.  She denies daytime sleepiness.  Her memory and mind raise no questions or suspicions during our visit.  Review of Systems:  Review of Systems  Constitutional: Negative.   HENT: Negative.    Respiratory: Negative.    Cardiovascular: Negative.   Gastrointestinal:  Positive for nausea and vomiting.  Genitourinary: Negative.   Musculoskeletal: Negative.   Neurological: Negative.    Psychiatric/Behavioral: Negative.    All other systems reviewed and are negative.  Past Medical History:  Diagnosis Date   Acute on chronic diastolic CHF (congestive heart failure), NYHA class 1 (HCC) 04/29/2013   Allergic rhinitis    Atrial fibrillation (HCC)    Pacemaker   Brady-tachy syndrome (HCC)    s/p STJ dual chamber PPM by Dr Sallyanne Kuster   CAD (coronary artery disease)    70% RCA stenosis, treated medically   Control of atrial fibrillation with pacemaker ([place])    Diabetes mellitus ([place])    DJD (degenerative joint disease)    Dysrhythmia    Paroxysmal Atrial Fib.   Hypertension    Hyperthyroidism    Paroxysmal atrial fibrillation (HCC)    [place]    as a child   PONV (postoperative nausea and vomiting)    Presence of permanent cardiac pacemaker    Pulmonary disease    Past Surgical History:  Procedure Laterality Date   arm surgery d/t fx Right 88   ATRIAL FIBRILLATION ABLATION  07/31/2012; 08-10-13   PVI by Dr Rayann Heman   ATRIAL FIBRILLATION ABLATION N/A 07/31/2012   Procedure: ATRIAL FIBRILLATION ABLATION;  Surgeon: Thompson Grayer, MD;  Location: [facility] C[facility] LAB;  Service: Cardiovascular;  Laterality: N/A;   ATRIAL FIBRILLATION ABLATION N/A 08/10/2013   Procedure: ATRIAL FIBRILLATION ABLATION;  Surgeon: Coralyn Mark, MD;  Location: [place] CATH LAB;  Service: Cardiovascular;  Laterality: N/A;   BACK SURGERY  07/26/2012   dR. nUDELMAN  CARDIAC CATHETERIZATION  12/03/2010   single vessel mid RCA   CARDIAC ELECTROPHYSIOLOGY MAPPING AND ABLATION  07/31/2012   Dr. Thompson Grayer   CESAREAN SECTION  09/20/1974   Dr. Gertie Fey   CHOLECYSTECTOMY  1994   COLONOSCOPY WITH PROPOFOL N/A 03/21/2015   Procedure: COLONOSCOPY WITH PROPOFOL;  Surgeon: Juanita Craver, MD;  Location: [facility] ENDOSCOPY;  Service: Endoscopy;  Laterality: N/A;   DIAGNOSTIC MAMMOGRAM  04/16/2017   Hedwig Morton. Morris, DO   LAPAROSCOPIC HYSTERECTOMY  Late 46s to early 31s   LEFT HEART CATH AND CORONARY ANGIOGRAPHY N/A 07/30/2017    Procedure: LEFT HEART CATH AND CORONARY ANGIOGRAPHY;  Surgeon: Troy Sine, MD;  Location: [place] CV LAB;  Service: Cardiovascular;  Laterality: N/A;   LUMBAR LAMINECTOMY/DECOMPRESSION MICRODISCECTOMY Right 12/23/2012   Procedure: Right L5-S1 Laminotomy for resection of synovial cyst;  Surgeon: Hosie Spangle, MD;  Location: [place] NEURO ORS;  Service: Neurosurgery;  Laterality: Right;  Right Lumbar five-sacral one Laminotomy for resection of synovial cyst   NM MYOCAR PERF WALL MOTION  12/20/2010   normal   PACEMAKER IMPLANT N/A 08/09/2019   Procedure: PACEMAKER IMPLANT;  Surgeon: Sanda Klein, MD;  Location: [place] CV LAB;  Service: Cardiovascular;  Laterality: N/A;   PACEMAKER INSERTION  12/03/10   SJM Accent DR RF implanted by Dr Sallyanne Kuster   RIGHT HEART CATH N/A 04/22/2018   Procedure: RIGHT HEART CATH;  Surgeon: Wellington Hampshire, MD;  Location: [place] CV LAB;  Service: Cardiovascular;  Laterality: N/A;   TEE WITHOUT CARDIOVERSION N/A 07/30/2012   Procedure: TRANSESOPHAGEAL ECHOCARDIOGRAM (TEE);  Surgeon: Sanda Klein, MD;  Location: [facility] ENDOSCOPY;  Service: Cardiovascular;  Laterality: N/A;  h&p in file-[place]   TEE WITHOUT CARDIOVERSION N/A 08/09/2013   Procedure: TRANSESOPHAGEAL ECHOCARDIOGRAM (TEE);  Surgeon: Lelon Perla, MD;  Location[facility] ENDOSCOPY;  Service: Cardiovascular;  Laterality: N/A;   TOTAL ABDOMINAL HYSTERECTOMY  1981   ULTRASOUND GUIDANCE FOR VASCULAR ACCESS  04/22/2018   Procedure: Ultrasound Guidance For Vascular Access;  Surgeon: Wellington Hampshire, MD;  Location: [place] CV LAB;  Service: Cardiovascular;;   Social History:   reports that she has never smoked. She has never used smokeless tobacco. She reports that she does not drink alcohol and does not use drugs.  Family History  Problem Relation Age of Onset   Cancer Brother    Heart attack Sister    Breast cancer Sister    Diabetes Sister    Heart disease Sister    Breast cancer Sister     Dementia Sister    Colon cancer Sister    Heart disease Sister    Breast cancer Sister    Suicidality Brother    Prostate cancer Brother    Dementia Brother     Medications: Patient's Medications  New Prescriptions   No medications on file  Previous Medications   CHOLECALCIFEROL (VITAMIN D3) 1000 UNITS CAPS    Take 1,000 Units by mouth daily.   CYANOCOBALAMIN (VITAMIN B12 PO)    Take 1 tablet by mouth daily as needed (for energy).    DABIGATRAN (PRADAXA) 150 MG CAPS CAPSULE    Take 1 capsule (150 mg total) by mouth 2 (two) times daily.   DABIGATRAN (PRADAXA) 75 MG CAPS CAPSULE    Take 1 capsule (75 mg total) by mouth 2 (two) times daily.   FUROSEMIDE (LASIX) 80 MG TABLET    Take 1 tablet by mouth once daily   FUROSEMIDE (LASIX) 80 MG TABLET  Take 1 tablet (80 mg total) by mouth daily.   GLUCOSE BLOOD (ONETOUCH ULTRA) TEST STRIP    USE DAILY TO CHECK BLOOD SUGAR E11.8   JANUVIA 100 MG TABLET    Take 1 tablet by mouth once daily   LANCETS MISC. (ONE TOUCH SURESOFT) MISC    Use to test  Blood sugar once daily DX: E11.8,   LOPERAMIDE (IMODIUM) 2 MG CAPSULE    Take 2 mg by mouth as needed for diarrhea or loose stools.    METHIMAZOLE (TAPAZOLE) 10 MG TABLET    Take one tablet by mouth once daily with food for thyroid   METOPROLOL TARTRATE (LOPRESSOR) 50 MG TABLET    TAKE 1 TABLET BY MOUTH IN THE MORNING AND 1/2 TABLET BY MOUTH IN THE EVENING   OXYBUTYNIN (DITROPAN) 5 MG TABLET    Take 1 tablet by mouth once daily   PANTOPRAZOLE (PROTONIX) 40 MG TABLET    Take 1 tablet by mouth once daily   POTASSIUM CHLORIDE SA (KLOR-CON) 20 MEQ TABLET    TAKE 1  BY MOUTH ONCE DAILY   SILDENAFIL (REVATIO) 20 MG TABLET    TAKE 1 TABLET BY MOUTH THREE TIMES DAILY   SPIRONOLACTONE (ALDACTONE) 25 MG TABLET    Take 1 tablet by mouth once daily  Modified Medications   No medications on file  Discontinued Medications   No medications on file    Physical Exam:  Vitals:   05/22/21 1327  BP: 122/74   Pulse: 63  Temp: (!) 96.8 F (36 C)  SpO2: 98%  Weight: 129 lb 9.6 oz (58.8 kg)  Height: 4\' 11"  (1.499 m)   Body mass index is 26.18 kg/m. Wt Readings from Last 3 Encounters:  05/22/21 129 lb 9.6 oz (58.8 kg)  01/29/21 131 lb 12.8 oz (59.8 kg)  11/15/20 124 lb 3.2 oz (56.3 kg)    Physical Exam Vitals and nursing note reviewed.  Constitutional:      Appearance: Normal appearance.  HENT:     Head: Normocephalic.  Cardiovascular:     Rate and Rhythm: Normal rate.     Pulses: Normal pulses.  Pulmonary:     Effort: Pulmonary effort is normal.     Breath sounds: Normal breath sounds.  Abdominal:     General: Abdomen is flat. Bowel sounds are normal.     Palpations: Abdomen is soft.  Musculoskeletal:        General: Normal range of motion.  Neurological:     General: No focal deficit present.     Mental Status: She is alert and oriented to person, place, and time.    Labs reviewed: Basic Metabolic Panel: Recent Labs    11/13/20 0955  NA 139  K 4.3  CL 101  CO2 27  GLUCOSE 99  BUN 16  CREATININE 1.22*  CALCIUM 9.5  TSH 3.81   Liver Function Tests: Recent Labs    11/13/20 0955  AST 15  ALT 9  BILITOT 1.6*  PROT 6.8   No results for input(s): LIPASE, AMYLASE in the last 8760 hours. No results for input(s): AMMONIA in the last 8760 hours. CBC: Recent Labs    11/13/20 0955  WBC 8.0  NEUTROABS 6,152  HGB 13.3  HCT 42.0  MCV 90.3  PLT 289   Lipid Panel: Recent Labs    11/13/20 0955  CHOL 125  HDL 50  LDLCALC 60  TRIG 70  CHOLHDL 2.5   TSH: Recent Labs    11/13/20  0955  TSH 3.81   A1C: Lab Results  Component Value Date   HGBA1C 6.2 (H) 11/13/2020     Assessment/Plan  1. Controlled type 2 diabetes mellitus with complication, without long-term current use of insulin (HCC) Fasting blood sugars are generally doing good by history we will repeat A1c today  2. Nausea and vomiting, unspecified vomiting type Symptomatic care seems to  be helping . Rx for Zofran  3. Need for influenza vaccination   4. Atrial fibrillation, unspecified type ([place]) Rhythm is regular today  5. Essential hypertension Takes metoprolol and diuretic  6. Hyperthyroidism Thyroid studies good 6 months ago  7. Nausea, vomiting, and diarrhea Likely viral and will resolve   Alain Honey, MD [place] [phone]

## 2021-05-23 LAB — HEMOGLOBIN A1C
Hgb A1c MFr Bld: 6.3 % of total Hgb — ABNORMAL HIGH (ref <5.7)
Mean Plasma Glucose: 134 mg/dL
eAG (mmol/L): 7.4 mmol/L

## 2021-06-25 ENCOUNTER — Other Ambulatory Visit: Payer: Self-pay | Admitting: Family Medicine

## 2021-07-02 ENCOUNTER — Other Ambulatory Visit: Payer: Self-pay | Admitting: Cardiovascular Disease

## 2021-07-02 ENCOUNTER — Other Ambulatory Visit: Payer: Self-pay | Admitting: Family Medicine

## 2021-07-02 DIAGNOSIS — E118 Type 2 diabetes mellitus with unspecified complications: Secondary | ICD-10-CM

## 2021-07-02 DIAGNOSIS — I272 Pulmonary hypertension, unspecified: Secondary | ICD-10-CM

## 2021-07-06 ENCOUNTER — Telehealth: Payer: Self-pay | Admitting: Cardiovascular Disease

## 2021-07-06 NOTE — Telephone Encounter (Signed)
Spoke to patient and she was told by the pharmacy that in order for her to get her refill she would have to pay. And patient wants the insurance to pay for the sildenafil. Patient also told me that the Dr has to fill out a form to approve the medication in order for the insurance to pay for the medication. Also a refill was sent to [facility] 07/02/21.

## 2021-07-06 NOTE — Telephone Encounter (Signed)
°*  STAT* If patient is at the pharmacy, call can be transferred to refill team.   1. Which medications need to be refilled? (please list name of each medication and dose if known) Sildenafil  2. Which pharmacy/location (including street and city if local pharmacy) is medication to be sent to?[facility]  3. Do they need a 30 day or 90 day supply? Please call this today, pt is out of it

## 2021-07-09 ENCOUNTER — Ambulatory Visit (INDEPENDENT_AMBULATORY_CARE_PROVIDER_SITE_OTHER): Payer: Medicare Other | Admitting: Cardiovascular Disease

## 2021-07-09 ENCOUNTER — Other Ambulatory Visit: Payer: Self-pay

## 2021-07-09 VITALS — BP 118/70 | Ht 59.0 in | Wt 131.4 lb

## 2021-07-09 DIAGNOSIS — I2510 Atherosclerotic heart disease of native coronary artery without angina pectoris: Secondary | ICD-10-CM | POA: Diagnosis not present

## 2021-07-09 DIAGNOSIS — I495 Sick sinus syndrome: Secondary | ICD-10-CM

## 2021-07-09 DIAGNOSIS — I5032 Chronic diastolic (congestive) heart failure: Secondary | ICD-10-CM

## 2021-07-09 DIAGNOSIS — I4811 Longstanding persistent atrial fibrillation: Secondary | ICD-10-CM

## 2021-07-09 DIAGNOSIS — Z950 Presence of cardiac pacemaker: Secondary | ICD-10-CM | POA: Diagnosis not present

## 2021-07-09 DIAGNOSIS — E118 Type 2 diabetes mellitus with unspecified complications: Secondary | ICD-10-CM | POA: Diagnosis not present

## 2021-07-09 DIAGNOSIS — D6869 Other thrombophilia: Secondary | ICD-10-CM | POA: Diagnosis not present

## 2021-07-09 DIAGNOSIS — I2721 Secondary pulmonary arterial hypertension: Secondary | ICD-10-CM | POA: Diagnosis not present

## 2021-07-09 DIAGNOSIS — I441 Atrioventricular block, second degree: Secondary | ICD-10-CM | POA: Diagnosis not present

## 2021-07-09 DIAGNOSIS — E0590 Thyrotoxicosis, unspecified without thyrotoxic crisis or storm: Secondary | ICD-10-CM | POA: Diagnosis not present

## 2021-07-09 MED ORDER — METOPROLOL TARTRATE 50 MG PO TABS: ORAL_TABLET | ORAL | 1 refills | Status: DC

## 2021-07-09 MED ORDER — APIXABAN 5 MG PO TABS: 5.0000 mg | ORAL_TABLET | Freq: Two times a day (BID) | ORAL | 11 refills | Status: DC

## 2021-07-09 NOTE — Patient Instructions (Signed)
Medication Instructions:  STOP the Pradaxa  START Eliquis 5 mg twice daily  *If you need a refill on your cardiac medications before your next appointment, please call your pharmacy*   Lab Work: None ordered If you have labs (blood work) drawn today and your tests are completely normal, you will receive your results only by: [place] (if you have MyChart) OR A paper copy in the mail If you have any lab test that is abnormal or we need to change your treatment, we will call you to review the results.   Testing/Procedures: None ordered   Follow-Up: At [facility], you and your health needs are our priority.  As part of our continuing mission to provide you with exceptional heart care, we have created designated Provider Care Teams.  These Care Teams include your primary Cardiologist (physician) and Advanced Practice Providers (APPs -  Physician Assistants and Nurse Practitioners) who all work together to provide you with the care you need, when you need it.  We recommend signing up for the patient portal called "MyChart".  Sign up information is provided on this After Visit Summary.  MyChart is used to connect with patients for Virtual Visits (Telemedicine).  Patients are able to view lab/test results, encounter notes, upcoming appointments, etc.  Non-urgent messages can be sent to your provider as well.   To learn more about what you can do with MyChart, go to [email or web address].    Your next appointment:   6 month(s)  The format for your next appointment:   In Person  Provider:   Sanda Klein, MD {

## 2021-07-09 NOTE — Progress Notes (Signed)
Patient ID: Hailey [name], female   DOB: [birth year], 76 y.o.   MRN: [id number]    Cardiology Office Note    Date:  07/10/2021   ID:  Hailey [name], Hailey [name] June 13, 1945, MRN [id number]  PCP:  Wardell Honour, MD  Cardiologist:  Thompson Grayer, MD;  Sanda Klein, MD   Chief Complaint  Patient presents with   Pulmonary artery hypertension   Congestive Heart Failure        Pacemaker Check   Atrial Fibrillation   Atrial fibrillation  History of Present Illness:  Hailey [name] is a 76 y.o. female with longstanding persistent atrial fibrillation (s/p RF ablation procedures, now with longstanding persistent atrial fibrillation for over a year), s/p dual-chamber permanent pacemaker ([employer] Accent 2012 for tachycardia-bradycardia syndrome and 2:1 atrioventricular block, generator change and new right ventricular lead for high pacing thresholds March 2021), chronic diastolic heart failure (history of acute decompensation on Multaq), multifactorial pulmonary artery hypertension, CAD (nonobstructive by cath February 2019), type 2 diabetes mellitus, hypertension, good lipid profile without therapy, hyperthyroidism (on propylthiouracil).  Overall she has done well since her last appointment.  She continues to live with her husband Filippou's dementia is progressing slowly.  She continues to be able to take care of household chores, but develops shortness of breath with somewhat more intense activities such as taking out the trash.  She denies orthopnea, PND and lower extremity edema.  She has not had angina pectoris.  She denies palpitations, dizziness or syncope.  She has not had any falls or bleeding problems and is compliant with Pradaxa anticoagulation.  Not had any focal neurological complaints.  She was informed by insurance company that they no longer will cover Pradaxa.  She has a history of GI side effects with Xarelto.  She has been in persistent atrial fibrillation for at least a year now.  She  has slow ventricular response due to previously known second-degree AV block and has 95% ventricular paced rhythm.  Thankfully this has not led to heart failure decompensation.  Lead parameters are normal.  Estimated generator longevity is around 10 years.  She does not have episodes of high ventricular rates.  Echocardiogram July 2020 showed normal left ventricular systolic function and EF of 60-65%, pseudonormal mitral inflow, moderately enlarged right ventricle, biatrial mild dilation, estimated systolic PA pressure 40 mmHg.  Previous right heart catheterization showed that her pulmonary hypertension could not be explained exclusively based on elevated left heart filling pressures.  She was evaluated in the rheumatology clinic by Dr. Amil Amen since she had an elevated ANA of 1: 640, but all her other serological markers were negative for lupus, rheumatoid arthritis or other connective tissue diseases.  Chest CT has shown numerous bilateral pulmonary nodules felt to be nonspecific.  These have been present back as far as 2012.  Repeat pulmonary function tests have been planned in June, but have not been performed.  She has a large goiter and is on chronic antithyroid medications.  Past Medical History:  Diagnosis Date   Acute on chronic diastolic CHF (congestive heart failure), NYHA class 1 (HCC) 04/29/2013   Allergic rhinitis    Atrial fibrillation (HCC)    Pacemaker   Brady-tachy syndrome (HCC)    s/p STJ dual chamber PPM by Dr Sallyanne Kuster   CAD (coronary artery disease)    70% RCA stenosis, treated medically   Control of atrial fibrillation with pacemaker ([place])    Diabetes mellitus ([place])    DJD (degenerative joint  disease)    Dysrhythmia    Paroxysmal Atrial Fib.   Hypertension    Hyperthyroidism    Paroxysmal atrial fibrillation (HCC)    Polio    as a child   PONV (postoperative nausea and vomiting)    Presence of permanent cardiac pacemaker    Pulmonary disease     Past Surgical  History:  Procedure Laterality Date   arm surgery d/t fx Right 64   ATRIAL FIBRILLATION ABLATION  07/31/2012; 08-10-13   PVI by Dr Rayann Heman   ATRIAL FIBRILLATION ABLATION N/A 07/31/2012   Procedure: ATRIAL FIBRILLATION ABLATION;  Surgeon: Thompson Grayer, MD;  Location: [facility] CATH LAB;  Service: Cardiovascular;  Laterality: N/A;   ATRIAL FIBRILLATION ABLATION N/A 08/10/2013   Procedure: ATRIAL FIBRILLATION ABLATION;  Surgeon: Coralyn Mark, MD;  Location: [place] CATH LAB;  Service: Cardiovascular;  Laterality: N/A;   BACK SURGERY  07/26/2012   dR. [facility]   CARDIAC CATHETERIZATION  12/03/2010   single vessel mid RCA   CARDIAC ELECTROPHYSIOLOGY MAPPING AND ABLATION  07/31/2012   Dr. Thompson Grayer   CESAREAN SECTION  09/20/1974   Dr. Gertie Fey   CHOLECYSTECTOMY  1994   COLONOSCOPY WITH PROPOFOL N/A 03/21/2015   Procedure: COLONOSCOPY WITH PROPOFOL;  Surgeon: Juanita Craver, MD;  Location: [facility] ENDOSCOPY;  Service: Endoscopy;  Laterality: N/A;   DIAGNOSTIC MAMMOGRAM  04/16/2017   Hedwig Morton. Morris, DO   LAPAROSCOPIC HYSTERECTOMY  Late 64s to early 50s   LEFT HEART CATH AND CORONARY ANGIOGRAPHY N/A 07/30/2017   Procedure: LEFT HEART CATH AND CORONARY ANGIOGRAPHY;  Surgeon: Troy Sine, MD;  Location: [place] CV LAB;  Service: Cardiovascular;  Laterality: N/A;   LUMBAR LAMINECTOMY/DECOMPRESSION MICRODISCECTOMY Right 12/23/2012   Procedure: Right L5-S1 Laminotomy for resection of synovial cyst;  Surgeon: Hosie Spangle, MD;  Location: [place] NEURO ORS;  Service: Neurosurgery;  Laterality: Right;  Right Lumbar five-sacral one Laminotomy for resection of synovial cyst   NM MYOCAR PERF WALL MOTION  12/20/2010   normal   PACEMAKER IMPLANT N/A 08/09/2019   Procedure: PACEMAKER IMPLANT;  Surgeon: Sanda Klein, MD;  Location: [place] CV LAB;  Service: Cardiovascular;  Laterality: N/A;   PACEMAKER INSERTION  12/03/10   SJM Accent DR RF implanted by Dr Sallyanne Kuster   RIGHT HEART CATH N/A 04/22/2018   Procedure: RIGHT  HEART CATH;  Surgeon: Wellington Hampshire, MD;  Location: [place] CV LAB;  Service: Cardiovascular;  Laterality: N/A;   TEE WITHOUT CARDIOVERSION N/A 07/30/2012   Procedure: TRANSESOPHAGEAL ECHOCARDIOGRAM (TEE);  Surgeon: Sanda Klein, MD;  Location: [facility] ENDOSCOPY;  Service: Cardiovascular;  Laterality: N/A;  h&p in file-hope   TEE WITHOUT CARDIOVERSION N/A 08/09/2013   Procedure: TRANSESOPHAGEAL ECHOCARDIOGRAM (TEE);  Surgeon: Lelon Perla, MD;  Location: [facility] ENDOSCOPY;  Service: Cardiovascular;  Laterality: N/A;   TOTAL ABDOMINAL HYSTERECTOMY  1981   ULTRASOUND GUIDANCE FOR VASCULAR ACCESS  04/22/2018   Procedure: Ultrasound Guidance For Vascular Access;  Surgeon: Wellington Hampshire, MD;  Location: [place] CV LAB;  Service: Cardiovascular;;    Current Medications: Outpatient Medications Prior to Visit  Medication Sig Dispense Refill   Cholecalciferol (VITAMIN D3) 1000 UNITS CAPS Take 1,000 Units by mouth daily.     furosemide (LASIX) 80 MG tablet Take 1 tablet by mouth once daily 90 tablet 0   glucose blood (ONETOUCH ULTRA) test strip USE DAILY TO CHECK BLOOD SUGAR E11.8 100 each 11   Lancets Misc. (ONE TOUCH SURESOFT) MISC Use to test  Blood sugar  once daily DX: E11.8, 1 each 0   loperamide (IMODIUM) 2 MG capsule Take 2 mg by mouth as needed for diarrhea or loose stools.      methimazole (TAPAZOLE) 10 MG tablet Take one tablet by mouth once daily with food for thyroid 90 tablet 1   oxybutynin (DITROPAN) 5 MG tablet Take 1 tablet by mouth once daily 90 tablet 1   pantoprazole (PROTONIX) 40 MG tablet Take 1 tablet by mouth once daily 90 tablet 1   potassium chloride SA (KLOR-CON) 20 MEQ tablet TAKE 1  BY MOUTH ONCE DAILY 90 tablet 3   sildenafil (REVATIO) 20 MG tablet TAKE 1 TABLET BY MOUTH THREE TIMES DAILY 90 tablet 0   sitaGLIPtin (JANUVIA) 100 MG tablet Take 1 tablet by mouth once daily 90 tablet 1   spironolactone (ALDACTONE) 25 MG tablet Take 1 tablet by mouth once daily 90  tablet 1   metoprolol tartrate (LOPRESSOR) 50 MG tablet TAKE 1 TABLET BY MOUTH IN THE MORNING AND 1/2 TABLET BY MOUTH IN THE EVENING 90 tablet 1   Cyanocobalamin (VITAMIN B12 PO) Take 1 tablet by mouth daily as needed (for energy).  (Patient not taking: Reported on 07/09/2021)     furosemide (LASIX) 80 MG tablet Take 1 tablet (80 mg total) by mouth daily. 90 tablet 3   ondansetron (ZOFRAN) 4 MG tablet Take 1 tablet (4 mg total) by mouth every 8 (eight) hours as needed for nausea or vomiting. (Patient not taking: Reported on 07/09/2021) 20 tablet 0   dabigatran (PRADAXA) 150 MG CAPS capsule Take 1 capsule (150 mg total) by mouth 2 (two) times daily. 180 capsule 3   dabigatran (PRADAXA) 75 MG CAPS capsule Take 1 capsule (75 mg total) by mouth 2 (two) times daily. (Patient not taking: Reported on 07/09/2021) 60 capsule 0   No facility-administered medications prior to visit.     Allergies:   Codeine, Ketorolac, Meperidine, Demerol, Dronedarone, and Cyclobenzaprine   Social History   Socioeconomic History   Marital status: Married    Spouse name: Hailey [name]   Number of children: 3   Years of education: 13   Highest education level: Some college, no degree  Occupational History   Occupation: housewife  Tobacco Use   Smoking status: Never   Smokeless tobacco: Never  Vaping Use   Vaping Use: Never used  Substance and Sexual Activity   Alcohol use: No   Drug use: No   Sexual activity: Not Currently    Birth control/protection: Abstinence  Other Topics Concern   Not on file  Social History Narrative   Lives in [place].  Retired.  Was office clerk in several companies.  Did some technical school after completing high school.  She has no pets.  She and her husband live in a one-story home.  They've been married since 1965.  She follows a heart healthy diet.  She has a living will and hcpoa but does not have a DNR form.  She does struggle to prepare food.     Social Determinants of Health    Financial Resource Strain: Not on file  Food Insecurity: Not on file  Transportation Needs: Not on file  Physical Activity: Not on file  Stress: Not on file  Social Connections: Not on file     Family History:  The patient's family history includes Breast cancer in her sister, sister, and sister; Cancer in her brother; Colon cancer in her sister; Dementia in her brother and sister; Diabetes  in her sister; Heart attack in her sister; Heart disease in her sister and sister; Prostate cancer in her brother; Suicidality in her brother.   ROS:   Please see the history of present illness.    All other systems are reviewed and are negative.   PHYSICAL EXAM:   VS:  BP 118/70    Ht 4\' 11"  (1.499 m)    Wt 131 lb 6.4 oz (59.6 kg)    BMI 26.54 kg/m     General: Alert, oriented x3, no distress, healthy left subclavian pacemaker site Head: no evidence of trauma, PERRL, EOMI, no exophtalmos or lid lag, no myxedema, no xanthelasma; normal ears, nose and oropharynx Neck: normal jugular venous pulsations and no hepatojugular reflux; brisk carotid pulses without delay and no carotid bruits.  She has an asymmetrical goiter more obvious on the right side. Chest: clear to auscultation, no signs of consolidation by percussion or palpation, normal fremitus, symmetrical and full respiratory excursions Cardiovascular: normal position and quality of the apical impulse, regular rhythm, normal first and second heart sounds, no murmurs, rubs or gallops Abdomen: no tenderness or distention, no masses by palpation, no abnormal pulsatility or arterial bruits, normal bowel sounds, no hepatosplenomegaly Extremities: no clubbing, cyanosis or edema; 2+ radial, ulnar and brachial pulses bilaterally; 2+ right femoral, posterior tibial and dorsalis pedis pulses; 2+ left femoral, posterior tibial and dorsalis pedis pulses; no subclavian or femoral bruits Neurological: grossly nonfocal Psych: Normal mood and  affect      Wt Readings from Last 3 Encounters:  07/09/21 131 lb 6.4 oz (59.6 kg)  05/22/21 129 lb 9.6 oz (58.8 kg)  01/29/21 131 lb 12.8 oz (59.8 kg)      Studies/Labs Reviewed:   Right heart catheterization April 22, 2018 1.  Mildly elevated left-sided filling pressures. 2.  Moderate to severe pulmonary hypertension with normal cardiac output.   Pulmonary capillary wedge pressure: 19 mmHg. PA pressure: 68/28 with a mean of 43 mmHg. Pulmonary vascular resistance: 4.9 Woods units Cardiac index was 2.97.   Recommendations: The patient seems to have mixed pulmonary hypertension due to left-sided heart failure and primary pulmonary hypertension.  She is mildly volume overloaded and I suspect that she can switch to oral furosemide tomorrow. Consider vasodilator therapy for pulmonary hypertension.   Left heart catheterization July 30, 2017   Ost 1st Diag to 1st Diag lesion is 10% stenosed. Mid RCA lesion is 20% stenosed. The left ventricular systolic function is normal. LV end diastolic pressure is normal.   No significant coronary obstructive disease with mild smooth 10% narrowing in the proximal to midportion of the first diagonal branch of the LAD; normal left circumflex coronary artery; and smooth 20% mid RCA narrowing.   Normal LV function without focal segmental wall motion abnormalities.  LVEDP 7.   RECOMMENDATION: Medical therapy.   Echo April 20, 2018     - Left ventricle: The cavity size was normal. Systolic function was   normal. The estimated ejection fraction was in the range of 55%   to 60%. Wall motion was normal; there were no regional wall   motion abnormalities. There was a reduced contribution of atrial   contraction to ventricular filling, due to increased ventricular   diastolic pressure or atrial contractile dysfunction. Features   are consistent with a pseudonormal left ventricular filling   pattern, with concomitant abnormal relaxation  and increased   filling pressure (grade 2 diastolic dysfunction). - Left atrium: The atrium was mildly dilated. - Right  ventricle: The cavity size was mildly dilated. Wall   thickness was normal. - Right atrium: The atrium was mildly dilated. - Tricuspid valve: There was mild-moderate regurgitation directed   centrally. - Pulmonary arteries: Systolic pressure was moderately to severely   increased. PA peak pressure: 66 mm Hg (S).   Impressions:   - PA pressure has increased since the previous study.       EKG:  EKG is ordered today and personally reviewed and shows atrial fibrillation with ventricular pacing.  Lipid Panel    Component Value Date/Time   CHOL 125 11/13/2020 0955   TRIG 70 11/13/2020 0955   HDL 50 11/13/2020 0955   CHOLHDL 2.5 11/13/2020 0955   VLDL 10 11/24/2010 0837   LDLCALC 60 11/13/2020 0955   BMET    Component Value Date/Time   NA 139 11/13/2020 0955   NA 138 08/05/2019 1416   K 4.3 11/13/2020 0955   CL 101 11/13/2020 0955   CO2 27 11/13/2020 0955   GLUCOSE 99 11/13/2020 0955   BUN 16 11/13/2020 0955   BUN 20 08/05/2019 1416   CREATININE 1.22 (H) 11/13/2020 0955   CALCIUM 9.5 11/13/2020 0955   GFRNONAA 43 (L) 11/13/2020 0955   GFRAA 50 (L) 11/13/2020 0955   Hemoglobin A1c 6.2% on 11/13/2020 ASSESSMENT:    1. Chronic diastolic heart failure ([place])   2. PAH (pulmonary artery hypertension) ([place])   3. Longstanding persistent atrial fibrillation ([place])   4. Acquired thrombophilia ([place])   5. Second degree AV block   6. SSS (sick sinus syndrome) ([place])   7. Pacemaker   8. Coronary artery disease involving native coronary artery of native heart without angina pectoris   9. Controlled type 2 diabetes mellitus with complication, without long-term current use of insulin ([place])   10. Hyperthyroidism        PLAN:  In order of problems listed above:   CHF: Preserved LVEF.  Stable NYHA functional class II status with euvolemia on a relatively  low-dose of diuretic.  Clinically euvolemic with NYHA functional class II.  High burden of ventricular pacing has not led to heart failure decompensation.  Considered SGLT2 inhibitors, but medication cost is a big constraint.  Her symptoms are relatively mild and we will defer this for the time being. PAH: Multifactorial, partly due to diastolic left heart failure, but with markedly increased transpulmonary gradient of 24 mmHg, consistent with a component of intrinsic arterial or lung disease.  The exact mechanism is uncertain although she has a markedly positive ANA without other findings to suggest systemic autoimmune disease.  Component of WHO group 1 PAH.  No evidence of progressive interstitial lung disease.  Has had a good clinical and echocardiographic response to chronic treatment with sildenafil. AFib: Previous ablation procedures and multiple antiarrhythmics have failed.  Now in longstanding persistent atrial fibrillation, being managed as permanent atrial fibrillation.  Rate control is not an issue in the presence of second-degree AV block.  She has a remarkably narrow paced QRS complex and actually feels better with ventricular pacing and then when we allow native AV conduction during threshold testing.  CHADSVasc at least 5 (age, gender, DM, CAD, history of CHF). Anticoagulation: We will switch from Pradaxa to Eliquis due to insurance requirements.  Has a history of GI intolerance to Xarelto. 2nd deg AVB: 95% ventricular paced rhythm.  She feels best when she has ventricular pacing.  She felt extremely poorly when she lost ventricular capture in the past and she feels  unwell when we check underlying rhythm more ventricular pacing thresholds.  SSS: Precedes diagnosis of AV block and persistent atrial fibrillation and was the initial reason for device implantation. PPM: Received a new generator and a new right ventricular lead in 2020.  Normal comprehensive check in the office today.  Continue  remote downloads every 3 months and at least yearly office visits. CAD: Denies angina pectoris.  Had nonobstructive disease at most recent cath in February 2019.  Recent lipid profile shows LDL cholesterol 60. DM: hemoglobin A1c was 6.3%.  In December 2022 Goiter: This appears to be enlarging slowly.  She is euthyroid on treatment with methimazole.  Most recent TSH that I have available for review was in normal range at 3.81 in June 2022.  Medication Adjustments/Labs and Tests Ordered: Current medicines are reviewed at length with the patient today.  Concerns regarding medicines are outlined above.  Medication changes, Labs and Tests ordered today are listed in the Patient Instructions below.   Patient Instructions  Medication Instructions:  STOP the Pradaxa  START Eliquis 5 mg twice daily  *If you need a refill on your cardiac medications before your next appointment, please call your pharmacy*   Lab Work: None ordered If you have labs (blood work) drawn today and your tests are completely normal, you will receive your results only by: [place] (if you have MyChart) OR A paper copy in the mail If you have any lab test that is abnormal or we need to change your treatment, we will call you to review the results.   Testing/Procedures: None ordered   Follow-Up: At [facility], you and your health needs are our priority.  As part of our continuing mission to provide you with exceptional heart care, we have created designated Provider Care Teams.  These Care Teams include your primary Cardiologist (physician) and Advanced Practice Providers (APPs -  Physician Assistants and Nurse Practitioners) who all work together to provide you with the care you need, when you need it.  We recommend signing up for the patient portal called "MyChart".  Sign up information is provided on this After Visit Summary.  MyChart is used to connect with patients for Virtual Visits (Telemedicine).   Patients are able to view lab/test results, encounter notes, upcoming appointments, etc.  Non-urgent messages can be sent to your provider as well.   To learn more about what you can do with MyChart, go to [email or web address].    Your next appointment:   6 month(s)  The format for your next appointment:   In Person  Provider:   Sanda Klein, MD {     Signed, Sanda Klein, MD  07/10/2021 5:25 PM    [address], [place], [place]  [address] Phone: [phone]; Fax: [phone]

## 2021-07-10 ENCOUNTER — Encounter: Payer: Self-pay | Admitting: Cardiovascular Disease

## 2021-07-10 DIAGNOSIS — I4811 Longstanding persistent atrial fibrillation: Secondary | ICD-10-CM | POA: Insufficient documentation

## 2021-07-11 ENCOUNTER — Telehealth: Payer: Self-pay | Admitting: *Deleted

## 2021-07-11 NOTE — Telephone Encounter (Signed)
Prior authorization completed for Sildenafil on [employer] website: key [id number]

## 2021-07-12 ENCOUNTER — Other Ambulatory Visit: Payer: Self-pay | Admitting: Cardiovascular Disease

## 2021-07-12 DIAGNOSIS — I272 Pulmonary hypertension, unspecified: Secondary | ICD-10-CM

## 2021-07-12 NOTE — Telephone Encounter (Signed)
**Note De-Identified  Obfuscation** Letter received from [facility] stating that they approved the pts Sildenafil PA until 07/11/2022. The letter states that they have also notified the pt of this approval as well.  I have notified [facility] located at [place] [id number] of this approval.

## 2021-07-25 ENCOUNTER — Encounter: Payer: Self-pay | Admitting: Emergency Medicine

## 2021-07-25 ENCOUNTER — Ambulatory Visit (INDEPENDENT_AMBULATORY_CARE_PROVIDER_SITE_OTHER): Payer: Medicare Other | Admitting: Emergency Medicine

## 2021-07-25 ENCOUNTER — Other Ambulatory Visit: Payer: Self-pay

## 2021-07-25 VITALS — BP 124/70 | HR 62 | Temp 98.1°F | Ht 59.0 in | Wt 132.6 lb

## 2021-07-25 DIAGNOSIS — I2721 Secondary pulmonary arterial hypertension: Secondary | ICD-10-CM | POA: Diagnosis not present

## 2021-07-25 DIAGNOSIS — E042 Nontoxic multinodular goiter: Secondary | ICD-10-CM | POA: Diagnosis not present

## 2021-07-25 DIAGNOSIS — R911 Solitary pulmonary nodule: Secondary | ICD-10-CM | POA: Diagnosis not present

## 2021-07-25 DIAGNOSIS — E0590 Thyrotoxicosis, unspecified without thyrotoxic crisis or storm: Secondary | ICD-10-CM

## 2021-07-25 DIAGNOSIS — I2510 Atherosclerotic heart disease of native coronary artery without angina pectoris: Secondary | ICD-10-CM

## 2021-07-25 LAB — T3: T3, Total: 176 ng/dL (ref 76–181)

## 2021-07-25 NOTE — Patient Instructions (Addendum)
We will order thyroid blood work today. We will arrange for a CT scan of your neck and chest Depending on your other testing, we may decide to perform a swallowing evaluation going forward Please continue your mediations as you have been taking them Follow with Dr Lamonte Sakai next available after your CT scans. Once we have reviewed we will coordinate next steps with Dr Sabra Heck.

## 2021-07-25 NOTE — Progress Notes (Signed)
Subjective:    Patient ID: Hailey [name], female    DOB: [birth year], 76 y.o.   MRN: [id number]  HPI  ROV 07/09/18 --Hailey [name] is 76, never smoker who follows up today for secondary pulmonary hypertension.  She has documented left-sided heart dysfunction. She is on sildenafil and is tolerating.  She is on anticoagulation which makes chronic thromboembolic disease unlikely.  She underwent pulmonary function testing today which I have reviewed.  This shows evidence for mild restriction on spirometry but with normal lung volumes.  There is no significant bronchodilator response.  Her diffusion capacity is normal.  A V/q scan was done on 06/08/2018 that was low probability for chronic thromboembolic disease.  Autoimmune labs were reviewed and showed a positive ANA, titer 1: 640.  Clinical significance for this unclear. Her CT chest from 04/23/18 shows effusions and some scattered pulmonary nodules.    ROV 07/25/21 --Hailey [name] is here to reestablish care.  I last saw her 3 years ago.  She is 76, never smoker, whom I have seen in the past for multifactorial secondary pulmonary hypertension.  Principal contributors include persistent atrial fibrillation (with pacemaker), chronic diastolic CHF, CAD and systolic hypertension.  She has restrictive lung disease on pulmonary function testing, no evidence of chronic thromboembolic disease on prior work-up.  She does have a positive ANA of unclear clinical significance.  We had been following scattered pulmonary nodules, deemed stable after serial CT scans most recent 05/31/2019. She has hyperthyroidism on methimazole. She feels that her goiter may be bigger or is impacting her swallowing now. She is having some dysphagia, some coughing when she swallows. She states that her breathing is stable. She does get some dyspnea with walking up stairs, has to stop when she takes out the trash. She cannot carry groceries. This has been her baseline for several years per her son  - she believes that the goiter may be affecting her breathing some, last 2 months.   Pulmonary function testing 07/09/2018 reviewed by me show spirometry consistent with restriction, possible mild obstruction.  Lung volumes normal.   Review of Systems  Constitutional:  Positive for unexpected weight change. Negative for fever.  HENT:  Negative for congestion, dental problem, ear pain, nosebleeds, postnasal drip, rhinorrhea, sinus pressure, sneezing, sore throat and trouble swallowing.   Eyes:  Negative for redness and itching.  Respiratory:  Positive for cough and shortness of breath. Negative for chest tightness and wheezing.   Cardiovascular:  Negative for palpitations and leg swelling.  Gastrointestinal:  Negative for nausea and vomiting.  Genitourinary:  Negative for dysuria.  Musculoskeletal:  Negative for joint swelling.  Skin:  Negative for rash.  Allergic/Immunologic: Negative.  Negative for environmental allergies, food allergies and immunocompromised state.  Neurological:  Negative for headaches.  Hematological:  Does not bruise/bleed easily.  Psychiatric/Behavioral:  Negative for dysphoric mood. The patient is not nervous/anxious.      Past Medical History:  Diagnosis Date   Acute on chronic diastolic CHF (congestive heart failure), NYHA class 1 (HCC) 04/29/2013   Allergic rhinitis    Atrial fibrillation (HCC)    Pacemaker   Brady-tachy syndrome (HCC)    s/p STJ dual chamber PPM by Dr Sallyanne Kuster   CAD (coronary artery disease)    70% RCA stenosis, treated medically   Control of atrial fibrillation with pacemaker ([place])    Diabetes mellitus ([place])    DJD (degenerative joint disease)    Dysrhythmia    Paroxysmal Atrial Fib.  Hypertension    Hyperthyroidism    Paroxysmal atrial fibrillation (HCC)    Polio    as a child   PONV (postoperative nausea and vomiting)    Presence of permanent cardiac pacemaker    Pulmonary disease      Family History  Problem Relation Age  of Onset   Cancer Brother    Heart attack Sister    Breast cancer Sister    Diabetes Sister    Heart disease Sister    Breast cancer Sister    Dementia Sister    Colon cancer Sister    Heart disease Sister    Breast cancer Sister    Suicidality Brother    Prostate cancer Brother    Dementia Brother      Social History   Socioeconomic History   Marital status: Married    Spouse name: [name]   Number of children: 3   Years of education: 13   Highest education level: Some college, no degree  Occupational History   Occupation: housewife  Tobacco Use   Smoking status: Never   Smokeless tobacco: Never  Vaping Use   Vaping Use: Never used  Substance and Sexual Activity   Alcohol use: No   Drug use: No   Sexual activity: Not Currently    Birth control/protection: Abstinence  Other Topics Concern   Not on file  Social History Narrative   Lives in [place].  Retired.  Was office clerk in several companies.  Did some technical school after completing high school.  She has no pets.  She and her husband live in a one-story home.  They've been married since 1965.  She follows a heart healthy diet.  She has a living will and hcpoa but does not have a DNR form.  She does struggle to prepare food.     Social Determinants of Health   Financial Resource Strain: Not on file  Food Insecurity: Not on file  Transportation Needs: Not on file  Physical Activity: Not on file  Stress: Not on file  Social Connections: Not on file  Intimate Partner Violence: Not on file     Allergies  Allergen Reactions   Codeine Nausea And Vomiting and Nausea Only   Ketorolac Nausea And Vomiting, Nausea Only and Other (See Comments)    "pt told to never take this med"   Meperidine Nausea Only   Demerol Nausea And Vomiting   Dronedarone Other (See Comments)    Increased fluid retention   Cyclobenzaprine Nausea And Vomiting     Outpatient Medications Prior to Visit  Medication Sig Dispense Refill    Cholecalciferol (VITAMIN D3) 1000 UNITS CAPS Take 1,000 Units by mouth daily.     Cyanocobalamin (VITAMIN B12 PO) Take 1 tablet by mouth daily as needed (for energy).     furosemide (LASIX) 80 MG tablet Take 1 tablet by mouth once daily 90 tablet 0   glucose blood (ONETOUCH ULTRA) test strip USE DAILY TO CHECK BLOOD SUGAR E11.8 100 each 11   Lancets Misc. (ONE TOUCH SURESOFT) MISC Use to test  Blood sugar once daily DX: E11.8, 1 each 0   loperamide (IMODIUM) 2 MG capsule Take 2 mg by mouth as needed for diarrhea or loose stools.      methimazole (TAPAZOLE) 10 MG tablet Take one tablet by mouth once daily with food for thyroid 90 tablet 1   metoprolol tartrate (LOPRESSOR) 50 MG tablet TAKE 1 TABLET BY MOUTH IN THE MORNING AND 1/2  TABLET BY MOUTH IN THE EVENING 135 tablet 1   ondansetron (ZOFRAN) 4 MG tablet Take 1 tablet (4 mg total) by mouth every 8 (eight) hours as needed for nausea or vomiting. 20 tablet 0   oxybutynin (DITROPAN) 5 MG tablet Take 1 tablet by mouth once daily 90 tablet 1   pantoprazole (PROTONIX) 40 MG tablet Take 1 tablet by mouth once daily 90 tablet 1   potassium chloride SA (KLOR-CON) 20 MEQ tablet TAKE 1  BY MOUTH ONCE DAILY 90 tablet 3   sildenafil (REVATIO) 20 MG tablet TAKE 1 TABLET BY MOUTH THREE TIMES DAILY 90 tablet 0   sitaGLIPtin (JANUVIA) 100 MG tablet Take 1 tablet by mouth once daily 90 tablet 1   spironolactone (ALDACTONE) 25 MG tablet Take 1 tablet by mouth once daily 90 tablet 1   apixaban (ELIQUIS) 5 MG TABS tablet Take 1 tablet (5 mg total) by mouth 2 (two) times daily. (Patient not taking: Reported on 07/25/2021) 60 tablet 11   furosemide (LASIX) 80 MG tablet Take 1 tablet (80 mg total) by mouth daily. (Patient not taking: Reported on 07/25/2021) 90 tablet 3   No facility-administered medications prior to visit.       Objective:   Physical Exam Vitals:   07/25/21 1553  BP: 124/70  Pulse: 62  Temp: 98.1 F (36.7 C)  TempSrc: Oral  SpO2: 97%   Weight: 132 lb 9.6 oz (60.1 kg)  Height: 4\' 11"  (1.499 m)    Gen: Pleasant, well-nourished elderly woman, in no distress,  normal affect  ENT: No lesions,  mouth clear,  oropharynx clear, no postnasal drip  Neck: JVD difficult to assess, she has a large goiter palpable on the R  Lungs: No use of accessory muscles, no crackles or wheezing on normal respiration, no wheeze on forced expiration  Cardiovascular: RRR, heart sounds normal, no murmur or gallops, no peripheral edema  Musculoskeletal: No deformities, no cyanosis or clubbing  Neuro: alert, awake, non focal  Skin: Warm, no lesions or rash      Assessment & Plan:  Multinodular goiter This seems to be her most pressing active problem.  She is having some increased dyspnea that correlates in time with an increase in size of her right neck fullness and goiter.  She is also noticing some cough and some dysphagia.  This is especially true with certain coarse foods such as cornbread, can also happen with liquids.  Certainly enlarging goiter could be impacting trachea, esophagus, neck structures.  I will plan to perform a CT scan of her neck and chest, evaluate the size and position of the goiter.  Also check repeat thyroid studies, these were reassuring in June.  Once we have this information will correlate with her symptoms.  She may require referral for surgery.  I will coordinate with her primary doctor Dr. Sabra Heck.   Pulmonary nodule Stable on serial imaging, last was in 07/2018.  We will evaluate on her upcoming repeat CT for interval stability.  PAH (pulmonary artery hypertension) (HCC) Multifactorial PAH.  On stable regimen.   Baltazar Apo, MD, PhD 07/25/2021, 4:40 PM [facility] [phone] or if no answer [phone]

## 2021-07-25 NOTE — Assessment & Plan Note (Signed)
This seems to be her most pressing active problem.  She is having some increased dyspnea that correlates in time with an increase in size of her right neck fullness and goiter.  She is also noticing some cough and some dysphagia.  This is especially true with certain coarse foods such as cornbread, can also happen with liquids.  Certainly enlarging goiter could be impacting trachea, esophagus, neck structures.  I will plan to perform a CT scan of her neck and chest, evaluate the size and position of the goiter.  Also check repeat thyroid studies, these were reassuring in June.  Once we have this information will correlate with her symptoms.  She may require referral for surgery.  I will coordinate with her primary doctor Dr. Sabra Heck.

## 2021-07-25 NOTE — Assessment & Plan Note (Signed)
Multifactorial PAH.  On stable regimen.

## 2021-07-25 NOTE — Assessment & Plan Note (Signed)
Stable on serial imaging, last was in 07/2018.  We will evaluate on her upcoming repeat CT for interval stability.

## 2021-07-26 LAB — BASIC METABOLIC PANEL
BUN: 14 mg/dL (ref 6–23)
CO2: 34 mEq/L — ABNORMAL HIGH (ref 19–32)
Calcium: 9.4 mg/dL (ref 8.4–10.5)
Chloride: 98 mEq/L (ref 96–112)
Creatinine, Ser: 1.23 mg/dL — ABNORMAL HIGH (ref 0.40–1.20)
GFR: 42.72 mL/min — ABNORMAL LOW (ref >60.00)
Glucose, Bld: 111 mg/dL — ABNORMAL HIGH (ref 70–99)
Potassium: 4.7 mEq/L (ref 3.5–5.1)
Sodium: 137 mEq/L (ref 135–145)

## 2021-07-26 LAB — T4, FREE: Free T4: 0.55 ng/dL — ABNORMAL LOW (ref 0.60–1.60)

## 2021-07-26 LAB — TSH: TSH: 6.42 u[IU]/mL — ABNORMAL HIGH (ref 0.35–5.50)

## 2021-07-26 NOTE — Addendum Note (Signed)
Addended by: Gavin Potters R on: 07/26/2021 11:16 AM   Modules accepted: Orders

## 2021-08-08 ENCOUNTER — Ambulatory Visit (INDEPENDENT_AMBULATORY_CARE_PROVIDER_SITE_OTHER): Payer: Medicare Other

## 2021-08-08 DIAGNOSIS — I4811 Longstanding persistent atrial fibrillation: Secondary | ICD-10-CM | POA: Diagnosis not present

## 2021-08-08 LAB — CUP PACEART REMOTE DEVICE CHECK
Battery Remaining Longevity: 120 mo
Battery Remaining Percentage: 85 %
Battery Voltage: 3.01 V
Brady Statistic RV Percent Paced: 96 %
Date Time Interrogation Session: 20230308040014
Implantable Lead Implant Date: 20120702
Implantable Lead Implant Date: 20120702
Implantable Lead Implant Date: 20210308
Implantable Lead Location: 753859
Implantable Lead Location: 753860
Implantable Lead Location: 753860
Implantable Pulse Generator Implant Date: 20210308
Lead Channel Impedance Value: 610 Ohm
Lead Channel Pacing Threshold Amplitude: 0.625 V
Lead Channel Pacing Threshold Pulse Width: 0.5 ms
Lead Channel Sensing Intrinsic Amplitude: 11.1 mV
Lead Channel Setting Pacing Amplitude: 0.875
Lead Channel Setting Pacing Pulse Width: 0.5 ms
Lead Channel Setting Sensing Sensitivity: 2 mV
Pulse Gen Model: 2272
Pulse Gen Serial Number: 3801383

## 2021-08-09 ENCOUNTER — Ambulatory Visit: Payer: Medicare Other | Admitting: Emergency Medicine

## 2021-08-10 ENCOUNTER — Ambulatory Visit (INDEPENDENT_AMBULATORY_CARE_PROVIDER_SITE_OTHER): Payer: Medicare Other | Admitting: Family

## 2021-08-10 ENCOUNTER — Encounter: Payer: Self-pay | Admitting: Family

## 2021-08-10 ENCOUNTER — Other Ambulatory Visit: Payer: Self-pay

## 2021-08-10 VITALS — BP 120/70 | HR 87 | Temp 97.1°F | Ht 59.0 in | Wt 130.8 lb

## 2021-08-10 DIAGNOSIS — R1030 Lower abdominal pain, unspecified: Secondary | ICD-10-CM | POA: Diagnosis not present

## 2021-08-10 DIAGNOSIS — R310 Gross hematuria: Secondary | ICD-10-CM | POA: Diagnosis not present

## 2021-08-10 DIAGNOSIS — R350 Frequency of micturition: Secondary | ICD-10-CM

## 2021-08-10 DIAGNOSIS — I2510 Atherosclerotic heart disease of native coronary artery without angina pectoris: Secondary | ICD-10-CM

## 2021-08-10 LAB — POCT URINALYSIS DIPSTICK
Bilirubin, UA: NEGATIVE
Glucose, UA: NEGATIVE
Nitrite, UA: POSITIVE
Protein, UA: POSITIVE — AB
Spec Grav, UA: >=1.03 — AB (ref 1.010–1.025)
Urobilinogen, UA: >=8 E.U./dL — AB
pH, UA: 7.5 (ref 5.0–8.0)

## 2021-08-10 MED ORDER — CIPROFLOXACIN HCL 500 MG PO TABS
500.0000 mg | ORAL_TABLET | Freq: Two times a day (BID) | ORAL | 0 refills | Status: AC
Start: 2021-08-10 — End: 2021-08-17

## 2021-08-10 NOTE — Patient Instructions (Signed)
- Notify provider if symptoms worsen or running any fever or chills  ? ?Urinary Tract Infection, Adult ?A urinary tract infection (UTI) is an infection of any part of the urinary tract. The urinary tract includes the kidneys, ureters, bladder, and urethra. These organs make, store, and get rid of urine in the body. ?An upper UTI affects the ureters and kidneys. A lower UTI affects the bladder and urethra. ?What are the causes? ?Most urinary tract infections are caused by bacteria in your genital area around your urethra, where urine leaves your body. These bacteria grow and cause inflammation of your urinary tract. ?What increases the risk? ?You are more likely to develop this condition if: ?You have a urinary catheter that stays in place. ?You are not able to control when you urinate or have a bowel movement (incontinence). ?You are female and you: ?Use a spermicide or diaphragm for birth control. ?Have low estrogen levels. ?Are pregnant. ?You have certain genes that increase your risk. ?You are sexually active. ?You take antibiotic medicines. ?You have a condition that causes your flow of urine to slow down, such as: ?An enlarged prostate, if you are female. ?Blockage in your urethra. ?A kidney stone. ?A nerve condition that affects your bladder control (neurogenic bladder). ?Not getting enough to drink, or not urinating often. ?You have certain medical conditions, such as: ?Diabetes. ?A weak disease-fighting system (immunesystem). ?Sickle cell disease. ?Gout. ?Spinal cord injury. ?What are the signs or symptoms? ?Symptoms of this condition include: ?Needing to urinate right away (urgency). ?Frequent urination. This may include small amounts of urine each time you urinate. ?Pain or burning with urination. ?Blood in the urine. ?Urine that smells bad or unusual. ?Trouble urinating. ?Cloudy urine. ?Vaginal discharge, if you are female. ?Pain in the abdomen or the lower back. ?You may also have: ?Vomiting or a decreased  appetite. ?Confusion. ?Irritability or tiredness. ?A fever or chills. ?Diarrhea. ?The first symptom in older adults may be confusion. In some cases, they may not have any symptoms until the infection has worsened. ?How is this diagnosed? ?This condition is diagnosed based on your medical history and a physical exam. You may also have other tests, including: ?Urine tests. ?Blood tests. ?Tests for STIs (sexually transmitted infections). ?If you have had more than one UTI, a cystoscopy or imaging studies may be done to determine the cause of the infections. ?How is this treated? ?Treatment for this condition includes: ?Antibiotic medicine. ?Over-the-counter medicines to treat discomfort. ?Drinking enough water to stay hydrated. ?If you have frequent infections or have other conditions such as a kidney stone, you may need to see a health care provider who specializes in the urinary tract (urologist). ?In rare cases, urinary tract infections can cause sepsis. Sepsis is a life-threatening condition that occurs when the body responds to an infection. Sepsis is treated in the hospital with IV antibiotics, fluids, and other medicines. ?Follow these instructions at home: ?Medicines ?Take over-the-counter and prescription medicines only as told by your health care provider. ?If you were prescribed an antibiotic medicine, take it as told by your health care provider. Do not stop using the antibiotic even if you start to feel better. ?General instructions ?Make sure you: ?Empty your bladder often and completely. Do not hold urine for long periods of time. ?Empty your bladder after sex. ?Wipe from front to back after urinating or having a bowel movement if you are female. Use each tissue only one time when you wipe. ?Drink enough fluid to keep your urine  pale yellow. ?Keep all follow-up visits. This is important. ?Contact a health care provider if: ?Your symptoms do not get better after 1-2 days. ?Your symptoms go away and then  return. ?Get help right away if: ?You have severe pain in your back or your lower abdomen. ?You have a fever or chills. ?You have nausea or vomiting. ?Summary ?A urinary tract infection (UTI) is an infection of any part of the urinary tract, which includes the kidneys, ureters, bladder, and urethra. ?Most urinary tract infections are caused by bacteria in your genital area. ?Treatment for this condition often includes antibiotic medicines. ?If you were prescribed an antibiotic medicine, take it as told by your health care provider. Do not stop using the antibiotic even if you start to feel better. ?Keep all follow-up visits. This is important. ?This information is not intended to replace advice given to you by your health care provider. Make sure you discuss any questions you have with your health care provider. ?Document Revised: 12/31/2019 Document Reviewed: 12/31/2019 ?Elsevier Patient Education ? [place]. ? ?

## 2021-08-10 NOTE — Progress Notes (Unsigned)
Provider: Clarinda Obi FNP-C  Wardell Honour, MD  Patient Care Team: Wardell Honour, MD as PCP - General (Family Medicine) Sanda Klein, MD as PCP - Cardiology (Cardiology) Collene Gobble, MD as Consulting Physician (Pulmonary Disease) Marica Otter, [place] (Optometry) Linda Hedges, DO as Consulting Physician (Obstetrics and Gynecology)  Extended Emergency Contact Information Primary Emergency Contact: Elsie Stain of [place] Phone: [phone] Mobile Phone: [phone] Relation: Daughter Secondary Emergency Contact: Hermine Messick [place] of [place] Mobile Phone: [phone] Relation: Daughter  Code Status:  Full Code  Goals of care: Advanced Directive information Advanced Directives 08/10/2021  Does Patient Have a Medical Advance Directive? No  Type of Advance Directive -  Does patient want to make changes to medical advance directive? -  Copy of [place] in Chart? -  Would patient like information on creating a medical advance directive? No - Patient declined  Pre-existing out of facility DNR order (yellow form or pink MOST form) -     Chief Complaint  Patient presents with   Acute Visit    Urinary Concerns. Patient complains of severe abdominal pain that goes to back. Patient has been having symptoms since Wednesday. Patient has blood in urine. Pain on urination,but no burning.    HPI:  Pt is a 76 y.o. female seen today for an acute visit for evaluation of severe abdominal pain that goes to the back x 3 days.Also has blood in the urine and burning sensation and urine frequency.Felt tired since she had symptoms. denies any fever,chills,nausea,vomiting,,flank pain,urgency,dysuria,difficult urination or hematuria,     Past Medical History:  Diagnosis Date   Acute on chronic diastolic CHF (congestive heart failure), NYHA class 1 (HCC) 04/29/2013   Allergic rhinitis    Atrial fibrillation (HCC)    Pacemaker    Brady-tachy syndrome (HCC)    s/p STJ dual chamber PPM by Dr Sallyanne Kuster   CAD (coronary artery disease)    70% RCA stenosis, treated medically   Control of atrial fibrillation with pacemaker ([place])    Diabetes mellitus ([place])    DJD (degenerative joint disease)    Dysrhythmia    Paroxysmal Atrial Fib.   Hypertension    Hyperthyroidism    Paroxysmal atrial fibrillation (HCC)    Polio    as a child   PONV (postoperative nausea and vomiting)    Presence of permanent cardiac pacemaker    Pulmonary disease    Past Surgical History:  Procedure Laterality Date   arm surgery d/t fx Right 79   ATRIAL FIBRILLATION ABLATION  07/31/2012; 08-10-13   PVI by Dr Rayann Heman   ATRIAL FIBRILLATION ABLATION N/A 07/31/2012   Procedure: ATRIAL FIBRILLATION ABLATION;  Surgeon: Thompson Grayer, MD;  Location: [facility] CATH LAB;  Service: Cardiovascular;  Laterality: N/A;   ATRIAL FIBRILLATION ABLATION N/A 08/10/2013   Procedure: ATRIAL FIBRILLATION ABLATION;  Surgeon: Coralyn Mark, MD;  Location: [place] CATH LAB;  Service: Cardiovascular;  Laterality: N/A;   BACK SURGERY  07/26/2012   dR. [facility]   CARDIAC CATHETERIZATION  12/03/2010   single vessel mid RCA   CARDIAC ELECTROPHYSIOLOGY MAPPING AND ABLATION  07/31/2012   Dr. Thompson Grayer   CESAREAN SECTION  09/20/1974   Dr. Gertie Fey   CHOLECYSTECTOMY  1994   COLONOSCOPY WITH PROPOFOL N/A 03/21/2015   Procedure: COLONOSCOPY WITH PROPOFOL;  Surgeon: Juanita Craver, MD;  Location: [facility] ENDOSCOPY;  Service: Endoscopy;  Laterality: N/A;   DIAGNOSTIC MAMMOGRAM  04/16/2017   Hedwig Morton. Morris, DO  LAPAROSCOPIC HYSTERECTOMY  Late 45s to early 52s   LEFT HEART CATH AND CORONARY ANGIOGRAPHY N/A 07/30/2017   Procedure: LEFT HEART CATH AND CORONARY ANGIOGRAPHY;  Surgeon: Troy Sine, MD;  Location: [place] CV LAB;  Service: Cardiovascular;  Laterality: N/A;   LUMBAR LAMINECTOMY/DECOMPRESSION MICRODISCECTOMY Right 12/23/2012   Procedure: Right L5-S1 Laminotomy for resection of  synovial cyst;  Surgeon: Hosie Spangle, MD;  Location: [place] NEURO ORS;  Service: Neurosurgery;  Laterality: Right;  Right Lumbar five-sacral one Laminotomy for resection of synovial cyst   NM MYOCAR PERF WALL MOTION  12/20/2010   normal   PACEMAKER IMPLANT N/A 08/09/2019   Procedure: PACEMAKER IMPLANT;  Surgeon: Sanda Klein, MD;  Location: [place] CV LAB;  Service: Cardiovascular;  Laterality: N/A;   PACEMAKER INSERTION  12/03/10   SJM Accent DR RF implanted by Dr Sallyanne Kuster   RIGHT HEART CATH N/A 04/22/2018   Procedure: RIGHT HEART CATH;  Surgeon: Wellington Hampshire, MD;  Location: [place] CV LAB;  Service: Cardiovascular;  Laterality: N/A;   TEE WITHOUT CARDIOVERSION N/A 07/30/2012   Procedure: TRANSESOPHAGEAL ECHOCARDIOGRAM (TEE);  Surgeon: Sanda Klein, MD;  Location: [facility] ENDOSCOPY;  Service: Cardiovascular;  Laterality: N/A;  h&p in file-hope   TEE WITHOUT CARDIOVERSION N/A 08/09/2013   Procedure: TRANSESOPHAGEAL ECHOCARDIOGRAM (TEE);  Surgeon: Lelon Perla, MD;  Location: [facility] ENDOSCOPY;  Service: Cardiovascular;  Laterality: N/A;   TOTAL ABDOMINAL HYSTERECTOMY  1981   ULTRASOUND GUIDANCE FOR VASCULAR ACCESS  04/22/2018   Procedure: Ultrasound Guidance For Vascular Access;  Surgeon: Wellington Hampshire, MD;  Location: [place] CV LAB;  Service: Cardiovascular;;    Allergies  Allergen Reactions   Codeine Nausea And Vomiting and Nausea Only   Ketorolac Nausea And Vomiting, Nausea Only and Other (See Comments)    "pt told to never take this med"   Meperidine Nausea Only   Demerol Nausea And Vomiting   Dronedarone Other (See Comments)    Increased fluid retention   Cyclobenzaprine Nausea And Vomiting    Outpatient Encounter Medications as of 08/10/2021  Medication Sig   apixaban (ELIQUIS) 5 MG TABS tablet Take 1 tablet (5 mg total) by mouth 2 (two) times daily.   Cholecalciferol (VITAMIN D3) 1000 UNITS CAPS Take 1,000 Units by mouth daily.   Cyanocobalamin (VITAMIN B12 PO)  Take 1 tablet by mouth daily as needed (for energy).   furosemide (LASIX) 80 MG tablet Take 1 tablet by mouth once daily   furosemide (LASIX) 80 MG tablet Take 1 tablet (80 mg total) by mouth daily.   glucose blood (ONETOUCH ULTRA) test strip USE DAILY TO CHECK BLOOD SUGAR E11.8   Lancets Misc. (ONE TOUCH SURESOFT) MISC Use to test  Blood sugar once daily DX: E11.8,   loperamide (IMODIUM) 2 MG capsule Take 2 mg by mouth as needed for diarrhea or loose stools.    methimazole (TAPAZOLE) 10 MG tablet Take one tablet by mouth once daily with food for thyroid   metoprolol tartrate (LOPRESSOR) 50 MG tablet TAKE 1 TABLET BY MOUTH IN THE MORNING AND 1/2 TABLET BY MOUTH IN THE EVENING   ondansetron (ZOFRAN) 4 MG tablet Take 1 tablet (4 mg total) by mouth every 8 (eight) hours as needed for nausea or vomiting.   oxybutynin (DITROPAN) 5 MG tablet Take 1 tablet by mouth once daily   pantoprazole (PROTONIX) 40 MG tablet Take 1 tablet by mouth once daily   potassium chloride SA (KLOR-CON) 20 MEQ tablet TAKE 1  BY  MOUTH ONCE DAILY   sildenafil (REVATIO) 20 MG tablet TAKE 1 TABLET BY MOUTH THREE TIMES DAILY   sitaGLIPtin (JANUVIA) 100 MG tablet Take 1 tablet by mouth once daily   spironolactone (ALDACTONE) 25 MG tablet Take 1 tablet by mouth once daily   No facility-administered encounter medications on file as of 08/10/2021.    Review of Systems  Immunization History  Administered Date(s) Administered   Fluad Quad(high Dose 65+) 01/21/2019, 03/27/2020, 05/22/2021   Influenza Whole 02/19/2013   Influenza, High Dose Seasonal PF 02/09/2018   PFIZER(Purple Top)SARS-COV-2 Vaccination 07/08/2019, 07/29/2019   Pfizer Covid-19 Vaccine Bivalent Booster 25yr & up 05/12/2020, 05/04/2021   Pneumococcal Polysaccharide-23 02/09/2018   Pneumococcal-Unspecified 04/29/2011   Tdap 03/05/2013   Pertinent  Health Maintenance Due  Topic Date Due   OPHTHALMOLOGY EXAM  Never done   URINE MICROALBUMIN  11/17/2020    FOOT EXAM  11/15/2021   HEMOGLOBIN A1C  11/20/2021   INFLUENZA VACCINE  Completed   DEXA SCAN  Completed   COLONOSCOPY (Pts 45-423yrInsurance coverage will need to be confirmed)  Discontinued   Fall Risk 08/10/2019 11/22/2019 03/27/2020 05/22/2021 08/10/2021  Falls in the past year? - 0 0 0 0  Was there an injury with Fall? - 0 0 0 0  Fall Risk Category Calculator - 0 0 0 0  Fall Risk Category - Low Low Low Low  Patient Fall Risk Level Moderate fall risk Low fall risk Low fall risk Low fall risk Low fall risk  Patient Fall Risk Level - - - - -  Patient at Risk for Falls Due to - - - No Fall Risks No Fall Risks  Fall risk Follow up - - - Falls evaluation completed;Education provided;Falls prevention discussed Falls evaluation completed   Functional Status Survey:    Vitals:   08/10/21 1308  BP: 120/70  Pulse: 87  Temp: (!) 97.1 F (36.2 C)  SpO2: 96%  Weight: 130 lb 12.8 oz (59.3 kg)  Height: '4\' 11"'$  (1.499 m)   Body mass index is 26.42 kg/m. Physical Exam  Labs reviewed: Recent Labs    11/13/20 0955 07/25/21 1638  NA 139 137  K 4.3 4.7  CL 101 98  CO2 27 34*  GLUCOSE 99 111*  BUN 16 14  CREATININE 1.22* 1.23*  CALCIUM 9.5 9.4   Recent Labs    11/13/20 0955  AST 15  ALT 9  BILITOT 1.6*  PROT 6.8   Recent Labs    11/13/20 0955  WBC 8.0  NEUTROABS 6,152  HGB 13.3  HCT 42.0  MCV 90.3  PLT 289   Lab Results  Component Value Date   TSH 6.42 (H) 07/25/2021   Lab Results  Component Value Date   HGBA1C 6.3 (H) 05/22/2021   Lab Results  Component Value Date   CHOL 125 11/13/2020   HDL 50 11/13/2020   LDLCALC 60 11/13/2020   TRIG 70 11/13/2020   CHOLHDL 2.5 11/13/2020    Significant Diagnostic Results in last 30 days:  CUP PACEART REMOTE DEVICE CHECK  Result Date: 08/08/2021 Scheduled remote reviewed. Normal device function.  Next remote 91 days. [place]   Assessment/Plan There are no diagnoses linked to this encounter.   Family/ staff  Communication: Reviewed plan of care with patient  Labs/tests ordered: None   Next Appointment:   DiSandrea HughsNP

## 2021-08-12 ENCOUNTER — Encounter: Payer: Self-pay | Admitting: Emergency Medicine

## 2021-08-12 LAB — CBC WITH DIFFERENTIAL/PLATELET
Absolute Monocytes: 696 cells/uL (ref 200–950)
Basophils Absolute: 35 cells/uL (ref 0–200)
Basophils Relative: 0.3 %
Eosinophils Absolute: 35 cells/uL (ref 15–500)
Eosinophils Relative: 0.3 %
HCT: 41.1 % (ref 35.0–45.0)
Hemoglobin: 13.6 g/dL (ref 11.7–15.5)
Lymphs Abs: 847 cells/uL — ABNORMAL LOW (ref 850–3900)
MCH: 29.6 pg (ref 27.0–33.0)
MCHC: 33.1 g/dL (ref 32.0–36.0)
MCV: 89.5 fL (ref 80.0–100.0)
MPV: 12 fL (ref 7.5–12.5)
Monocytes Relative: 6 %
Neutro Abs: 9988 cells/uL — ABNORMAL HIGH (ref 1500–7800)
Neutrophils Relative %: 86.1 %
Platelets: 268 10*3/uL (ref 140–400)
RBC: 4.59 10*6/uL (ref 3.80–5.10)
RDW: 12.1 % (ref 11.0–15.0)
Total Lymphocyte: 7.3 %
WBC: 11.6 10*3/uL — ABNORMAL HIGH (ref 3.8–10.8)

## 2021-08-12 LAB — URINE CULTURE
MICRO NUMBER:: 13115799
SPECIMEN QUALITY:: ADEQUATE

## 2021-08-13 ENCOUNTER — Other Ambulatory Visit: Payer: Self-pay

## 2021-08-13 MED ORDER — SACCHAROMYCES BOULARDII 250 MG PO CAPS
250.0000 mg | ORAL_CAPSULE | Freq: Two times a day (BID) | ORAL | 0 refills | Status: AC
Start: 2021-08-13 — End: 2021-08-23

## 2021-08-16 ENCOUNTER — Ambulatory Visit
Admission: RE | Admit: 2021-08-16 | Discharge: 2021-08-16 | Disposition: A | Discharge status: Home / Self Care | Payer: Medicare Other | Source: Ambulatory Visit | Attending: Emergency Medicine | Admitting: Emergency Medicine

## 2021-08-16 DIAGNOSIS — I2510 Atherosclerotic heart disease of native coronary artery without angina pectoris: Secondary | ICD-10-CM | POA: Diagnosis not present

## 2021-08-16 DIAGNOSIS — I700 Atherosclerosis of aorta: Secondary | ICD-10-CM | POA: Diagnosis not present

## 2021-08-16 DIAGNOSIS — I517 Cardiomegaly: Secondary | ICD-10-CM | POA: Diagnosis not present

## 2021-08-16 DIAGNOSIS — E042 Nontoxic multinodular goiter: Secondary | ICD-10-CM | POA: Diagnosis not present

## 2021-08-16 DIAGNOSIS — K838 Other specified diseases of biliary tract: Secondary | ICD-10-CM | POA: Diagnosis not present

## 2021-08-16 DIAGNOSIS — E039 Hypothyroidism, unspecified: Secondary | ICD-10-CM | POA: Diagnosis not present

## 2021-08-16 MED ORDER — IOPAMIDOL (ISOVUE-300) INJECTION 61%
75.0000 mL | Freq: Once | INTRAVENOUS | Status: AC | PRN
  Administered 2021-08-16: 75 mL via INTRAVENOUS

## 2021-08-21 NOTE — Progress Notes (Signed)
Remote pacemaker transmission.   

## 2021-08-22 ENCOUNTER — Other Ambulatory Visit: Payer: Self-pay

## 2021-08-22 ENCOUNTER — Ambulatory Visit (INDEPENDENT_AMBULATORY_CARE_PROVIDER_SITE_OTHER): Payer: Medicare Other | Admitting: Emergency Medicine

## 2021-08-22 ENCOUNTER — Encounter: Payer: Self-pay | Admitting: Emergency Medicine

## 2021-08-22 VITALS — BP 122/74 | HR 60 | Temp 98.5°F | Ht 59.0 in | Wt 130.2 lb

## 2021-08-22 DIAGNOSIS — R0683 Snoring: Secondary | ICD-10-CM | POA: Insufficient documentation

## 2021-08-22 DIAGNOSIS — E042 Nontoxic multinodular goiter: Secondary | ICD-10-CM | POA: Diagnosis not present

## 2021-08-22 DIAGNOSIS — R911 Solitary pulmonary nodule: Secondary | ICD-10-CM

## 2021-08-22 DIAGNOSIS — I2510 Atherosclerotic heart disease of native coronary artery without angina pectoris: Secondary | ICD-10-CM | POA: Diagnosis not present

## 2021-08-22 NOTE — Patient Instructions (Addendum)
We reviewed your CT scan of the chest and your CT scan of the neck today.  It does look like your goiter is enlarged, may be impacting her swallowing and your breathing intermittently especially when you are laying down to sleep.  Dr. Lamonte Sakai will discuss with Dr. Sabra Heck and we will determine whether you should be referred to see Endocrinology, Surgery, possibly both ?We will arrange for a home sleep study ?Please continue your other medications as you have been taking them ?Follow with Dr. Lamonte Sakai in 2 months or sooner if you have any problems.  ? ?

## 2021-08-22 NOTE — Assessment & Plan Note (Signed)
Unchanged on her most recent CT scan of the chest, have now been stable for another interval dating back to 05/2019.  Almost certainly benign.  No dedicated follow-up planned ?

## 2021-08-22 NOTE — Assessment & Plan Note (Signed)
Larger on her CT scan of the neck.  The trachea is patent but it does appear to be causing positional dyspnea, dysphagia.  She has been treated for her hyperthyroidism.  Question whether she will need surgical referral for possible resection.  I will communicate with her primary care physician Dr. Sabra Heck regarding a possible endocrinology evaluation, surgical evaluation. ?

## 2021-08-22 NOTE — Assessment & Plan Note (Signed)
Suspicion for possible upper airway obstruction while sleeping, question correlate with her enlarging goiter.  Her daughter hears snoring, is suspicious for OSA.  The patient sleeps at an incline and is dyspneic when she is supine.  We will arrange for home sleep study to better characterize.  This may impact plans for the goiter, including possible surgery.  May also impact possible CPAP, etc. ?

## 2021-08-22 NOTE — Progress Notes (Signed)
? ?Subjective:  ? ? Patient ID: Hailey [name], female    DOB: Jul 12, 1945, 76 y.o.   MRN: [id number] ? ?HPI ? ?ROV 07/25/21 --Hailey [name] is here to reestablish care.  I last saw her 3 years ago.  She is 76, never smoker, whom I have seen in the past for multifactorial secondary pulmonary hypertension.  Principal contributors include persistent atrial fibrillation (with pacemaker), chronic diastolic CHF, CAD and systolic hypertension.  She has restrictive lung disease on pulmonary function testing, no evidence of chronic thromboembolic disease on prior work-up.  She does have a positive ANA of unclear clinical significance.  We had been following scattered pulmonary nodules, deemed stable after serial CT scans most recent 05/31/2019. ?She has hyperthyroidism on methimazole. She feels that her goiter may be bigger or is impacting her swallowing now. She is having some dysphagia, some coughing when she swallows. She states that her breathing is stable. She does get some dyspnea with walking up stairs, has to stop when she takes out the trash. She cannot carry groceries. This has been her baseline for several years per her son - she believes that the goiter may be affecting her breathing some, last 2 months.  ? ?Pulmonary function testing 07/09/2018 reviewed by me show spirometry consistent with restriction, possible mild obstruction.  Lung volumes normal. ? ? ?ROV 08/22/21 --76 year old woman with atrial fibrillation, chronic diastolic CHF, systolic hypertension and multifactorial secondary pulmonary hypertension.  She also has restrictive lung disease on pulmonary function testing, a positive ANA of unclear significance.  She has hyperthyroidism with a goiter.  She reestablish care with me last month because she was dealing with progressive exertional shortness of breath as well as some dysphagia.  We performed CT scan of the chest and neck as below. Her swallowing is unchanged, still has to drink liquids and re-swallow  to get food to transit. She sleeps upright. She snores, can snore herself awake. No witnessed apneas.  ? ?CT scan of the chest 08/16/2021 reviewed by me showed a slowly enlarging heterogeneous thyroid compatible with large goiter.  Slightly enlarged compared with prior most recently 05/2019 without any clear impact on the trachea or esophagus.  Her multiple pulmonary nodules and scattered lymphadenopathy in the chest are unchanged compared with 2020, reassuring. ? ? ?Review of Systems  ?Constitutional:  Positive for unexpected weight change. Negative for fever.  ?HENT:  Negative for congestion, dental problem, ear pain, nosebleeds, postnasal drip, rhinorrhea, sinus pressure, sneezing, sore throat and trouble swallowing.   ?Eyes:  Negative for redness and itching.  ?Respiratory:  Positive for cough and shortness of breath. Negative for chest tightness and wheezing.   ?Cardiovascular:  Negative for palpitations and leg swelling.  ?Gastrointestinal:  Negative for nausea and vomiting.  ?Genitourinary:  Negative for dysuria.  ?Musculoskeletal:  Negative for joint swelling.  ?Skin:  Negative for rash.  ?Allergic/Immunologic: Negative.  Negative for environmental allergies, food allergies and immunocompromised state.  ?Neurological:  Negative for headaches.  ?Hematological:  Does not bruise/bleed easily.  ?Psychiatric/Behavioral:  Negative for dysphoric mood. The patient is not nervous/anxious.   ? ? ? ?Past Medical History:  ?Diagnosis Date  ? Acute on chronic diastolic CHF (congestive heart failure), NYHA class 1 ([place]) 04/29/2013  ? Allergic rhinitis   ? Atrial fibrillation ([place])   ? Pacemaker  ? Brady-tachy syndrome ([place])   ? s/p STJ dual chamber PPM by Dr Sallyanne Kuster  ? CAD (coronary artery disease)   ? 70% RCA stenosis, treated medically  ?  Control of atrial fibrillation with pacemaker [facility])   ? Diabetes mellitus ([place])   ? DJD (degenerative joint disease)   ? Dysrhythmia   ? Paroxysmal Atrial Fib.  ? Hypertension   ?  Hyperthyroidism   ? Paroxysmal atrial fibrillation (HCC)   ? Polio   ? as a child  ? PONV (postoperative nausea and vomiting)   ? Presence of permanent cardiac pacemaker   ? Pulmonary disease   ?  ? ?Family History  ?Problem Relation Age of Onset  ? Cancer Brother   ? Heart attack Sister   ? Breast cancer Sister   ? Diabetes Sister   ? Heart disease Sister   ? Breast cancer Sister   ? Dementia Sister   ? Colon cancer Sister   ? Heart disease Sister   ? Breast cancer Sister   ? Suicidality Brother   ? Prostate cancer Brother   ? Dementia Brother   ?  ? ?Social History  ? ?Socioeconomic History  ? Marital status: Married  ?  Spouse name: [name]  ? Number of children: 3  ? Years of education: 51  ? Highest education level: Some college, no degree  ?Occupational History  ? Occupation: housewife  ?Tobacco Use  ? Smoking status: Never  ? Smokeless tobacco: Never  ?Vaping Use  ? Vaping Use: Never used  ?Substance and Sexual Activity  ? Alcohol use: No  ? Drug use: No  ? Sexual activity: Not Currently  ?  Birth control/protection: Abstinence  ?Other Topics Concern  ? Not on file  ?Social History Narrative  ? Lives in [place].  Retired.  Was office clerk in several companies.  Did some technical school after completing high school.  She has no pets.  She and her husband live in a one-story home.  They've been married since 1965.  She follows a heart healthy diet.  She has a living will and hcpoa but does not have a DNR form.  She does struggle to prepare food.    ? ?Social Determinants of Health  ? ?Financial Resource Strain: Not on file  ?Food Insecurity: Not on file  ?Transportation Needs: Not on file  ?Physical Activity: Not on file  ?Stress: Not on file  ?Social Connections: Not on file  ?Intimate Partner Violence: Not on file  ?  ? ?Allergies  ?Allergen Reactions  ? Codeine Nausea And Vomiting and Nausea Only  ? Ketorolac Nausea And Vomiting, Nausea Only and Other (See Comments)  ?  "pt told to never take this med"   ? Meperidine Nausea Only  ? Demerol Nausea And Vomiting  ? Dronedarone Other (See Comments)  ?  Increased fluid retention  ? Cyclobenzaprine Nausea And Vomiting  ?  ? ?Outpatient Medications Prior to Visit  ?Medication Sig Dispense Refill  ? apixaban (ELIQUIS) 5 MG TABS tablet Take 1 tablet (5 mg total) by mouth 2 (two) times daily. 60 tablet 11  ? Cholecalciferol (VITAMIN D3) 1000 UNITS CAPS Take 1,000 Units by mouth daily.    ? Cyanocobalamin (VITAMIN B12 PO) Take 1 tablet by mouth daily as needed (for energy).    ? furosemide (LASIX) 80 MG tablet Take 1 tablet (80 mg total) by mouth daily. 90 tablet 3  ? glucose blood (ONETOUCH ULTRA) test strip USE DAILY TO CHECK BLOOD SUGAR E11.8 100 each 11  ? Lancets Misc. (ONE TOUCH SURESOFT) MISC Use to test  Blood sugar once daily DX: E11.8, 1 each 0  ? loperamide (IMODIUM)  2 MG capsule Take 2 mg by mouth as needed for diarrhea or loose stools.     ? methimazole (TAPAZOLE) 10 MG tablet Take one tablet by mouth once daily with food for thyroid 90 tablet 1  ? metoprolol tartrate (LOPRESSOR) 50 MG tablet TAKE 1 TABLET BY MOUTH IN THE MORNING AND 1/2 TABLET BY MOUTH IN THE EVENING 135 tablet 1  ? ondansetron (ZOFRAN) 4 MG tablet Take 1 tablet (4 mg total) by mouth every 8 (eight) hours as needed for nausea or vomiting. 20 tablet 0  ? oxybutynin (DITROPAN) 5 MG tablet Take 1 tablet by mouth once daily 90 tablet 1  ? pantoprazole (PROTONIX) 40 MG tablet Take 1 tablet by mouth once daily 90 tablet 1  ? potassium chloride SA (KLOR-CON) 20 MEQ tablet TAKE 1  BY MOUTH ONCE DAILY 90 tablet 3  ? saccharomyces boulardii (FLORASTOR) 250 MG capsule Take 1 capsule (250 mg total) by mouth 2 (two) times daily for 10 days. 20 capsule 0  ? sildenafil (REVATIO) 20 MG tablet TAKE 1 TABLET BY MOUTH THREE TIMES DAILY 90 tablet 0  ? sitaGLIPtin (JANUVIA) 100 MG tablet Take 1 tablet by mouth once daily 90 tablet 1  ? spironolactone (ALDACTONE) 25 MG tablet Take 1 tablet by mouth once daily 90  tablet 1  ? furosemide (LASIX) 80 MG tablet Take 1 tablet by mouth once daily 90 tablet 0  ? ?No facility-administered medications prior to visit.  ? ? ?   ?Objective:  ? Physical Exam ?Vitals:  ? 08/22/21 1132

## 2021-08-23 ENCOUNTER — Other Ambulatory Visit: Payer: Self-pay | Admitting: Cardiovascular Disease

## 2021-08-23 DIAGNOSIS — I272 Pulmonary hypertension, unspecified: Secondary | ICD-10-CM

## 2021-08-28 ENCOUNTER — Encounter: Payer: Self-pay | Admitting: Emergency Medicine

## 2021-08-29 NOTE — Telephone Encounter (Signed)
Mychart message sent by pt: ?[facility] Hughs [facility] (supporting Lamonte Sakai, Rose Fillers, MD) 16 hours ago (5:31 PM)  ? ? I am inquiring with regards to the progress on the next steps after my last visit.  ?  ?First - you were to discuss my goiter enlargement with Dr. Sabra Heck, my PCP,  to refer me to endocrinology.  Has this been accomplish?  Should I contact Dr. Sabra Heck or will he contact me? ?  ?Second - A sleep study was also part of the next steps.  Has this been scheduled?  When should I expect scheduling to contact me?  ?  ?Please advise on the above as soon as you can.  ?Thank you  ?Best Regards, ?Hailey [name] ? ? ? ?Pt was made aware in a mychart message back to her that [facility] are 10-12 weeks out in getting pts in for the home sleep study. ? ?In regards to the first part of her message, Dr. Lamonte Sakai, please advise. ?

## 2021-09-05 ENCOUNTER — Telehealth: Payer: Self-pay | Admitting: *Deleted

## 2021-09-05 DIAGNOSIS — R1312 Dysphagia, oropharyngeal phase: Secondary | ICD-10-CM

## 2021-09-05 DIAGNOSIS — E042 Nontoxic multinodular goiter: Secondary | ICD-10-CM

## 2021-09-05 NOTE — Telephone Encounter (Signed)
It's okay to continue with Probiotics if it's working well for you. ?Will place a referral to ENT for evaluation of goiter and swallowing issues.specialist office will call you to schedule appointment.  ?

## 2021-09-05 NOTE — Telephone Encounter (Signed)
Patient called and stated that she saw Dinah on 08/10/2021 and had a couple of Questions: ? ?1.) Wants to know if she can continue the [facility] for more that 10 days because it makes her feel so much better.  ? ?2.) Pulmonologist recommended ENT referral for goiter(bothering how she swallows) but stated that it had to come from PCP. Wondering if a referral can be placed.  ? ?Please Advise.  ?

## 2021-09-06 NOTE — Telephone Encounter (Signed)
Patient notified and agreed.  

## 2021-09-10 ENCOUNTER — Other Ambulatory Visit: Payer: Self-pay | Admitting: Cardiovascular Disease

## 2021-09-18 ENCOUNTER — Encounter: Payer: Self-pay | Admitting: Family Medicine

## 2021-09-25 ENCOUNTER — Ambulatory Visit (INDEPENDENT_AMBULATORY_CARE_PROVIDER_SITE_OTHER): Payer: Medicare Other | Admitting: Family Medicine

## 2021-09-25 ENCOUNTER — Encounter: Payer: Self-pay | Admitting: Family Medicine

## 2021-09-25 VITALS — BP 128/70 | HR 60 | Temp 97.1°F | Ht 59.0 in | Wt 129.8 lb

## 2021-09-25 DIAGNOSIS — I2510 Atherosclerotic heart disease of native coronary artery without angina pectoris: Secondary | ICD-10-CM

## 2021-09-25 DIAGNOSIS — M25551 Pain in right hip: Secondary | ICD-10-CM | POA: Insufficient documentation

## 2021-09-25 NOTE — Progress Notes (Signed)
? ? ?Provider:  ?Alain Honey, MD ? ?Careteam: ?Patient Care Team: ?Wardell Honour, MD as PCP - General (Family Medicine) ?Croitoru, Dani Gobble, MD as PCP - Cardiology (Cardiology) ?Collene Gobble, MD as Consulting Physician (Pulmonary Disease) ?Marica Otter, OD (Optometry) ?Linda Hedges, DO as Consulting Physician (Obstetrics and Gynecology) ? ?PLACE OF SERVICE:  ?[facility]  ?Advanced Directive information ?  ? ?Allergies  ?Allergen Reactions  ? Codeine Nausea And Vomiting and Nausea Only  ? Ketorolac Nausea And Vomiting, Nausea Only and Other (See Comments)  ?  "pt told to never take this med"  ? Meperidine Nausea Only  ? Demerol Nausea And Vomiting  ? Dronedarone Other (See Comments)  ?  Increased fluid retention  ? Cyclobenzaprine Nausea And Vomiting  ? ? ?Chief Complaint  ?Patient presents with  ? Acute Visit  ?  Patient presents today for hip and back pain.  ? ? ? ?HPI: Patient is a 76 y.o. female patient has a 1 week history of right hip pain.  Seems to localize more in the mid hip.  It does not radiate down her leg.  She denies any numbness or weakness in the leg.  She has a past history of back surgery.  There is no history of any injury or trauma.  Pain worse at days and taking 1 or 2 Tylenol per day for pain ? ?Review of Systems:  ?Review of Systems  ?Constitutional: Negative.   ?Musculoskeletal:  Positive for back pain.  ?     Right hip pain  ?All other systems reviewed and are negative. ? ?Past Medical History:  ?Diagnosis Date  ? Acute on chronic diastolic CHF (congestive heart failure), NYHA class 1 ([place]) 04/29/2013  ? Allergic rhinitis   ? Atrial fibrillation ([place])   ? Pacemaker  ? Brady-tachy syndrome ([place])   ? s/p STJ dual chamber PPM by Dr Sallyanne Kuster  ? CAD (coronary artery disease)   ? 70% RCA stenosis, treated medically  ? Control of atrial fibrillation with pacemaker [facility])   ? Diabetes mellitus ([place])   ? DJD (degenerative joint disease)   ? Dysrhythmia   ? Paroxysmal Atrial Fib.  ?  Hypertension   ? Hyperthyroidism   ? Paroxysmal atrial fibrillation (HCC)   ? Polio   ? as a child  ? PONV (postoperative nausea and vomiting)   ? Presence of permanent cardiac pacemaker   ? Pulmonary disease   ? ?Past Surgical History:  ?Procedure Laterality Date  ? arm surgery d/t fx Right 96  ? ATRIAL FIBRILLATION ABLATION  07/31/2012; 08-10-13  ? PVI by Dr Rayann Heman  ? ATRIAL FIBRILLATION ABLATION N/A 07/31/2012  ? Procedure: ATRIAL FIBRILLATION ABLATION;  Surgeon: Thompson Grayer, MD;  [facility] CATH LAB;  Service: Cardiovascular;  Laterality: N/A;  ? ATRIAL FIBRILLATION ABLATION N/A 08/10/2013  ? Procedure: ATRIAL FIBRILLATION ABLATION;  Surgeon: Coralyn Mark, MD;  Location: [place] CATH LAB;  Service: Cardiovascular;  Laterality: N/A;  ? BACK SURGERY  07/26/2012  ? dR. nUDELMAN  ? CARDIAC CATHETERIZATION  12/03/2010  ? single vessel mid RCA  ? CARDIAC ELECTROPHYSIOLOGY MAPPING AND ABLATION  07/31/2012  ? Dr. Thompson Grayer  ? CESAREAN SECTION  09/20/1974  ? Dr. Gertie Fey  ? CHOLECYSTECTOMY  1994  ? COLONOSCOPY WITH PROPOFOL N/A 03/21/2015  ? Procedure: COLONOSCOPY WITH PROPOFOL;  Surgeon: Juanita Craver, MD;  Location: [facility] ENDOSCOPY;  Service: Endoscopy;  Laterality: N/A;  ? DIAGNOSTIC MAMMOGRAM  04/16/2017  ? Hedwig Morton. Morris, DO  ? LAPAROSCOPIC  HYSTERECTOMY  Late 79s to early 80s  ? LEFT HEART CATH AND CORONARY ANGIOGRAPHY N/A 07/30/2017  ? Procedure: LEFT HEART CATH AND CORONARY ANGIOGRAPHY;  Surgeon: Troy Sine, MD;  Location: [place] CV LAB;  Service: Cardiovascular;  Laterality: N/A;  ? LUMBAR LAMINECTOMY/DECOMPRESSION MICRODISCECTOMY Right 12/23/2012  ? Procedure: Right L5-S1 Laminotomy for resection of synovial cyst;  Surgeon: Hosie Spangle, MD;  Location: [place] NEURO ORS;  Service: Neurosurgery;  Laterality: Right;  Right Lumbar five-sacral one Laminotomy for resection of synovial cyst  ? NM MYOCAR PERF WALL MOTION  12/20/2010  ? normal  ? PACEMAKER IMPLANT N/A 08/09/2019  ? Procedure: PACEMAKER IMPLANT;  Surgeon:  Sanda Klein, MD;  Location: [place] CV LAB;  Service: Cardiovascular;  Laterality: N/A;  ? PACEMAKER INSERTION  12/03/10  ? SJM Accent DR RF implanted by Dr Sallyanne Kuster  ? RIGHT HEART CATH N/A 04/22/2018  ? Procedure: RIGHT HEART CATH;  Surgeon: Wellington Hampshire, MD;  Location: [place] CV LAB;  Service: Cardiovascular;  Laterality: N/A;  ? TEE WITHOUT CARDIOVERSION N/A 07/30/2012  ? Procedure: TRANSESOPHAGEAL ECHOCARDIOGRAM (TEE);  Surgeon: Sanda Klein, MD;  Location: [facility] ENDOSCOPY;  Service: Cardiovascular;  Laterality: N/A;  h&p in file-[facility]  ? TEE WITHOUT CARDIOVERSION N/A 08/09/2013  ? Procedure: TRANSESOPHAGEAL ECHOCARDIOGRAM (TEE);  Surgeon: Lelon Perla, MD;  Location: [place];  Service: Cardiovascular;  Laterality: N/A;  ? TOTAL ABDOMINAL HYSTERECTOMY  1981  ? ULTRASOUND GUIDANCE FOR VASCULAR ACCESS  04/22/2018  ? Procedure: Ultrasound Guidance For Vascular Access;  Surgeon: Wellington Hampshire, MD;  Location: [place] CV LAB;  Service: Cardiovascular;;  ? ?Social History: ?  reports that she has never smoked. She has never used smokeless tobacco. She reports that she does not drink alcohol and does not use drugs. ? ?Family History  ?Problem Relation Age of Onset  ? Cancer Brother   ? Heart attack Sister   ? Breast cancer Sister   ? Diabetes Sister   ? Heart disease Sister   ? Breast cancer Sister   ? Dementia Sister   ? Colon cancer Sister   ? Heart disease Sister   ? Breast cancer Sister   ? Suicidality Brother   ? Prostate cancer Brother   ? Dementia Brother   ? ? ?Medications: ?Patient's Medications  ?New Prescriptions  ? No medications on file  ?Previous Medications  ? APIXABAN (ELIQUIS) 5 MG TABS TABLET    Take 1 tablet (5 mg total) by mouth 2 (two) times daily.  ? CHOLECALCIFEROL (VITAMIN D3) 1000 UNITS CAPS    Take 1,000 Units by mouth daily.  ? CYANOCOBALAMIN (VITAMIN B12 PO)    Take 1 tablet by mouth daily as needed (for energy).  ? FUROSEMIDE (LASIX) 80 MG TABLET    Take 1 tablet  by mouth once daily  ? FUROSEMIDE (LASIX) 80 MG TABLET    Take 1 tablet (80 mg total) by mouth daily.  ? GLUCOSE BLOOD (ONETOUCH ULTRA) TEST STRIP    USE DAILY TO CHECK BLOOD SUGAR E11.8  ? LANCETS MISC. (ONE TOUCH SURESOFT) MISC    Use to test  Blood sugar once daily DX: E11.8,  ? LOPERAMIDE (IMODIUM) 2 MG CAPSULE    Take 2 mg by mouth as needed for diarrhea or loose stools.   ? METHIMAZOLE (TAPAZOLE) 10 MG TABLET    Take one tablet by mouth once daily with food for thyroid  ? METOPROLOL TARTRATE (LOPRESSOR) 50 MG TABLET    TAKE  1 TABLET BY MOUTH IN THE MORNING AND 1/2 TABLET BY MOUTH IN THE EVENING  ? ONDANSETRON (ZOFRAN) 4 MG TABLET    Take 1 tablet (4 mg total) by mouth every 8 (eight) hours as needed for nausea or vomiting.  ? OXYBUTYNIN (DITROPAN) 5 MG TABLET    Take 1 tablet by mouth once daily  ? PANTOPRAZOLE (PROTONIX) 40 MG TABLET    Take 1 tablet by mouth once daily  ? POTASSIUM CHLORIDE SA (KLOR-CON M20) 20 MEQ TABLET    TAKE 1  BY MOUTH ONCE DAILY  ? SILDENAFIL (REVATIO) 20 MG TABLET    TAKE 1 TABLET BY MOUTH THREE TIMES DAILY  ? SITAGLIPTIN (JANUVIA) 100 MG TABLET    Take 1 tablet by mouth once daily  ? SPIRONOLACTONE (ALDACTONE) 25 MG TABLET    Take 1 tablet by mouth once daily  ?Modified Medications  ? No medications on file  ?Discontinued Medications  ? No medications on file  ? ? ?Physical Exam: ? ?There were no vitals filed for this visit. ?There is no height or weight on file to calculate BMI. ?Wt Readings from Last 3 Encounters:  ?08/22/21 130 lb 3.2 oz (59.1 kg)  ?08/10/21 130 lb 12.8 oz (59.3 kg)  ?07/25/21 132 lb 9.6 oz (60.1 kg)  ? ? ?Physical Exam ?Vitals and nursing note reviewed.  ?Constitutional:   ?   Appearance: Normal appearance.  ?Cardiovascular:  ?   Rate and Rhythm: Normal rate.  ?Pulmonary:  ?   Effort: Pulmonary effort is normal.  ?Musculoskeletal:  ?   Comments: Right hip.  Squish sign is negative ?There is tenderness over the right SI joint as well as greater  trochanter. ?Straight leg raising is negative ?Deep tendon reflexes are symmetric ?Strength is equal in lower extremities  ?Neurological:  ?   Mental Status: She is alert.  ? ? ?Labs reviewed: ?Basic Metabolic Panel: ?Recent Labs

## 2021-09-25 NOTE — Patient Instructions (Signed)
May take tylenol, 500 mg, 1-2 tab three times a day ?

## 2021-09-26 ENCOUNTER — Encounter: Payer: Self-pay | Admitting: Emergency Medicine

## 2021-10-23 ENCOUNTER — Ambulatory Visit: Payer: Medicare Other | Admitting: Emergency Medicine

## 2021-10-30 ENCOUNTER — Ambulatory Visit: Payer: Medicare Other

## 2021-10-30 DIAGNOSIS — E042 Nontoxic multinodular goiter: Secondary | ICD-10-CM

## 2021-11-01 ENCOUNTER — Other Ambulatory Visit: Payer: Self-pay | Admitting: Family Medicine

## 2021-11-02 ENCOUNTER — Ambulatory Visit: Payer: Medicare Other

## 2021-11-02 DIAGNOSIS — G4733 Obstructive sleep apnea (adult) (pediatric): Secondary | ICD-10-CM | POA: Diagnosis not present

## 2021-11-07 ENCOUNTER — Ambulatory Visit (INDEPENDENT_AMBULATORY_CARE_PROVIDER_SITE_OTHER): Payer: Medicare Other

## 2021-11-07 DIAGNOSIS — I495 Sick sinus syndrome: Secondary | ICD-10-CM | POA: Diagnosis not present

## 2021-11-09 DIAGNOSIS — G4733 Obstructive sleep apnea (adult) (pediatric): Secondary | ICD-10-CM | POA: Diagnosis not present

## 2021-11-12 ENCOUNTER — Telehealth: Payer: Self-pay | Admitting: Family Medicine

## 2021-11-12 NOTE — Telephone Encounter (Signed)
Patient notified that an X-Ray does not require an appointment

## 2021-11-12 NOTE — Telephone Encounter (Signed)
Pt called to report Hip XRay ordered 4/25 has not been done, pt states she never received a call to schedule. Pt wants to know if provider still wants her to have it done. Pt appt 6/20 with Dr. Sabra Heck.  Pt wants to go this week for XRay before her appt next week.  Please advise.

## 2021-11-13 LAB — CUP PACEART REMOTE DEVICE CHECK
Battery Remaining Longevity: 117 mo
Battery Remaining Percentage: 83 %
Battery Voltage: 3.01 V
Brady Statistic RV Percent Paced: 96 %
Date Time Interrogation Session: 20230607054828
Implantable Lead Implant Date: 20120702
Implantable Lead Implant Date: 20120702
Implantable Lead Implant Date: 20210308
Implantable Lead Location: 753859
Implantable Lead Location: 753860
Implantable Lead Location: 753860
Implantable Pulse Generator Implant Date: 20210308
Lead Channel Impedance Value: 610 Ohm
Lead Channel Pacing Threshold Amplitude: 0.625 V
Lead Channel Pacing Threshold Pulse Width: 0.5 ms
Lead Channel Sensing Intrinsic Amplitude: 12 mV
Lead Channel Setting Pacing Amplitude: 0.875
Lead Channel Setting Pacing Pulse Width: 0.5 ms
Lead Channel Setting Sensing Sensitivity: 2 mV
Pulse Gen Model: 2272
Pulse Gen Serial Number: 3801383

## 2021-11-14 ENCOUNTER — Other Ambulatory Visit: Payer: Self-pay | Admitting: Family Medicine

## 2021-11-14 ENCOUNTER — Other Ambulatory Visit: Payer: Self-pay | Admitting: Nurse Practitioner

## 2021-11-14 DIAGNOSIS — N3281 Overactive bladder: Secondary | ICD-10-CM

## 2021-11-14 NOTE — Telephone Encounter (Signed)
High risk warning populated when attempting to approve refill request. Will send to Wardell Honour, MD to review.

## 2021-11-15 ENCOUNTER — Ambulatory Visit
Admission: RE | Admit: 2021-11-15 | Discharge: 2021-11-15 | Disposition: A | Discharge status: Home / Self Care | Payer: Medicare Other | Source: Ambulatory Visit | Attending: Family Medicine | Admitting: Family Medicine

## 2021-11-15 ENCOUNTER — Other Ambulatory Visit: Payer: Self-pay | Admitting: Family Medicine

## 2021-11-15 DIAGNOSIS — M25551 Pain in right hip: Secondary | ICD-10-CM

## 2021-11-16 NOTE — Progress Notes (Signed)
Remote pacemaker transmission.   

## 2021-11-20 ENCOUNTER — Encounter: Payer: Self-pay | Admitting: Family Medicine

## 2021-11-20 ENCOUNTER — Ambulatory Visit (INDEPENDENT_AMBULATORY_CARE_PROVIDER_SITE_OTHER): Payer: Medicare Other | Admitting: Family Medicine

## 2021-11-20 VITALS — BP 122/58 | HR 61 | Temp 97.4°F | Resp 16 | Ht 59.0 in | Wt 132.0 lb

## 2021-11-20 DIAGNOSIS — M25551 Pain in right hip: Secondary | ICD-10-CM

## 2021-11-20 DIAGNOSIS — I4891 Unspecified atrial fibrillation: Secondary | ICD-10-CM

## 2021-11-20 DIAGNOSIS — K766 Portal hypertension: Secondary | ICD-10-CM | POA: Diagnosis not present

## 2021-11-20 DIAGNOSIS — I2510 Atherosclerotic heart disease of native coronary artery without angina pectoris: Secondary | ICD-10-CM

## 2021-11-20 DIAGNOSIS — E118 Type 2 diabetes mellitus with unspecified complications: Secondary | ICD-10-CM

## 2021-11-20 DIAGNOSIS — I10 Essential (primary) hypertension: Secondary | ICD-10-CM | POA: Diagnosis not present

## 2021-11-21 LAB — HEMOGLOBIN A1C
Hgb A1c MFr Bld: 6.2 % of total Hgb — ABNORMAL HIGH (ref <5.7)
Mean Plasma Glucose: 131 mg/dL
eAG (mmol/L): 7.3 mmol/L

## 2021-11-21 LAB — MICROALBUMIN / CREATININE URINE RATIO
Creatinine, Urine: 99 mg/dL (ref 20–275)
Microalb Creat Ratio: 14 mcg/mg creat (ref <30)
Microalb, Ur: 1.4 mg/dL

## 2021-11-21 NOTE — Progress Notes (Signed)
Provider:  Alain Honey, MD  Careteam: Patient Care Team: Hailey Honour, MD as PCP - General (Family Medicine) Croitoru, Dani Gobble, MD as PCP - Cardiology (Cardiology) Collene Gobble, MD as Consulting Physician (Pulmonary Disease) Marica Otter, [name] (Optometry) Linda Hedges, DO as Consulting Physician (Obstetrics and Gynecology)  PLACE OF SERVICE:  [name] Directive information Does Patient Have a Medical Advance Directive?: Yes, Would patient like information on creating a medical advance directive?: No - Patient declined, Type of Advance Directive: [place];Living will, Does patient want to make changes to medical advance directive?: No - Patient declined  Allergies  Allergen Reactions   Codeine Nausea And Vomiting and Nausea Only   Ketorolac Nausea And Vomiting, Nausea Only and Other (See Comments)    "pt told to never take this med"   Meperidine Nausea Only   Demerol Nausea And Vomiting   Dronedarone Other (See Comments)    Increased fluid retention   Cyclobenzaprine Nausea And Vomiting    Chief Complaint  Patient presents with   Medical Management of Chronic Issues    6 month follow-up. Foot exam today. Discuss need for shingrix, PCV, urine microalbumin, and A1c. NCIR verified.      HPI: Patient is a 76 y.o. female patient continues with chronic pain in right hip.  Since her last visit here she underwent x-ray which shows moderate osteoarthritis.  The pain localizes around the SI joint on the right side and goes into her hip but does not go down her leg.  Review of a abdominal CT from 5 or 6 years ago shows some disc disease as well the osteoarthritis.  Since her visit here she has been using Tylenol and local heat with some success. Review of Systems:  Review of Systems  Constitutional: Negative.   HENT: Negative.    Respiratory: Negative.    Cardiovascular: Negative.   Musculoskeletal:  Positive for joint pain.  Neurological:  Negative.   Psychiatric/Behavioral: Negative.    All other systems reviewed and are negative.   Past Medical History:  Diagnosis Date   Acute on chronic diastolic CHF (congestive heart failure), NYHA class 1 (HCC) 04/29/2013   Allergic rhinitis    Atrial fibrillation (HCC)    Pacemaker   Brady-tachy syndrome (HCC)    s/p STJ dual chamber PPM by Dr Sallyanne Kuster   CAD (coronary artery disease)    70% RCA stenosis, treated medically   Control of atrial fibrillation with pacemaker ([name])    Diabetes mellitus ([name])    DJD (degenerative joint disease)    Dysrhythmia    Paroxysmal Atrial Fib.   Hypertension    Hyperthyroidism    Paroxysmal atrial fibrillation (HCC)    Polio    as a child   PONV (postoperative nausea and vomiting)    Presence of permanent cardiac pacemaker    Pulmonary disease    Past Surgical History:  Procedure Laterality Date   arm surgery d/t fx Right 42   ATRIAL FIBRILLATION ABLATION  07/31/2012; 08-10-13   PVI by Dr Rayann Heman   ATRIAL FIBRILLATION ABLATION N/A 07/31/2012   Procedure: ATRIAL FIBRILLATION ABLATION;  Surgeon: Thompson Grayer, MD;  [name];  Service: Cardiovascular;  Laterality: N/A;   ATRIAL FIBRILLATION ABLATION N/A 08/10/2013   Procedure: ATRIAL FIBRILLATION ABLATION;  Surgeon: Coralyn Mark, MD;  [name];  Service: Cardiovascular;  Laterality: N/A;   BACK SURGERY  07/26/2012   dR. [facility]   CARDIAC CATHETERIZATION  12/03/2010   single vessel mid RCA   CARDIAC ELECTROPHYSIOLOGY MAPPING AND ABLATION  07/31/2012   Dr. Thompson Grayer   CESAREAN SECTION  09/20/1974   Dr. Gertie Fey   CHOLECYSTECTOMY  1994   COLONOSCOPY WITH PROPOFOL N/A 03/21/2015   Procedure: COLONOSCOPY WITH PROPOFOL;  Surgeon: Juanita Craver, MD;  Location: [name];  Service: [name];  Laterality: N/A;   DIAGNOSTIC MAMMOGRAM  04/16/2017   Hedwig Morton. Morris, DO   LAPAROSCOPIC HYSTERECTOMY  Late 43s to early 11s   LEFT HEART CATH AND CORONARY ANGIOGRAPHY N/A  07/30/2017   Procedure: LEFT HEART CATH AND CORONARY ANGIOGRAPHY;  Surgeon: Troy Sine, MD;  Location: [name];  Service: Cardiovascular;  Laterality: N/A;   LUMBAR LAMINECTOMY/DECOMPRESSION MICRODISCECTOMY Right 12/23/2012   Procedure: Right L5-S1 Laminotomy for resection of synovial cyst;  Surgeon: Hosie Spangle, MD;  Location: [name];  Service: Neurosurgery;  Laterality: Right;  Right Lumbar five-sacral one Laminotomy for resection of synovial cyst   NM MYOCAR PERF WALL MOTION  12/20/2010   normal   PACEMAKER IMPLANT N/A 08/09/2019   Procedure: PACEMAKER IMPLANT;  Surgeon: Sanda Klein, MD;  Location: [name];  Service: Cardiovascular;  Laterality: N/A;   PACEMAKER INSERTION  12/03/10   SJM Accent DR RF implanted by Dr Sallyanne Kuster   RIGHT HEART CATH N/A 04/22/2018   Procedure: RIGHT HEART CATH;  Surgeon: Wellington Hampshire, MD;  Location: [name];  Service: Cardiovascular;  Laterality: N/A;   TEE WITHOUT CARDIOVERSION N/A 07/30/2012   Procedure: TRANSESOPHAGEAL ECHOCARDIOGRAM (TEE);  Surgeon: Sanda Klein, MD;  [name];  Service: Cardiovascular;  Laterality: N/A;  h&p in file-[facility]   TEE WITHOUT CARDIOVERSION N/A 08/09/2013   Procedure: TRANSESOPHAGEAL ECHOCARDIOGRAM (TEE);  Surgeon: Lelon Perla, MD;  Location: [name];  Service: Cardiovascular;  Laterality: N/A;   TOTAL ABDOMINAL HYSTERECTOMY  1981   ULTRASOUND GUIDANCE FOR VASCULAR ACCESS  04/22/2018   Procedure: Ultrasound Guidance For Vascular Access;  Surgeon: Wellington Hampshire, MD;  Location: [name];  Service: Cardiovascular;;   Social History:   reports that she has never smoked. She has never used smokeless tobacco. She reports that she does not drink alcohol and does not use drugs.  Family History  Problem Relation Age of Onset   Cancer Brother    Heart attack Sister    Breast cancer Sister    Diabetes Sister    Heart disease Sister    Breast cancer  Sister    Dementia Sister    Colon cancer Sister    Heart disease Sister    Breast cancer Sister    Suicidality Brother    Prostate cancer Brother    Dementia Brother     Medications: Patient's Medications  New Prescriptions   No medications on file  Previous Medications   APIXABAN (ELIQUIS) 5 MG TABS TABLET    Take 1 tablet (5 mg total) by mouth 2 (two) times daily.   CHOLECALCIFEROL (VITAMIN D3) 1000 UNITS CAPS    Take 1,000 Units by mouth daily.   CYANOCOBALAMIN (VITAMIN B12 PO)    Take 1 tablet by mouth daily as needed (for energy).   FUROSEMIDE (LASIX) 80 MG TABLET    Take 1 tablet by mouth once daily   FUROSEMIDE (LASIX) 80 MG TABLET    Take 1 tablet (80 mg total) by mouth daily.   GLUCOSE BLOOD (ONETOUCH ULTRA) TEST STRIP    USE DAILY TO CHECK BLOOD  SUGAR E11.8   LANCETS MISC. (ONE TOUCH SURESOFT) MISC    Use to test  Blood sugar once daily DX: E11.8,   LOPERAMIDE (IMODIUM) 2 MG CAPSULE    Take 2 mg by mouth as needed for diarrhea or loose stools.    METHIMAZOLE (TAPAZOLE) 10 MG TABLET    TAKE 1 TABLET BY MOUTH ONCE DAILY WITH FOOD FOR THYROID   METOPROLOL TARTRATE (LOPRESSOR) 50 MG TABLET    TAKE 1 TABLET BY MOUTH IN THE MORNING AND 1/2 TABLET BY MOUTH IN THE EVENING   ONDANSETRON (ZOFRAN) 4 MG TABLET    Take 1 tablet (4 mg total) by mouth every 8 (eight) hours as needed for nausea or vomiting.   OXYBUTYNIN (DITROPAN) 5 MG TABLET    Take 1 tablet by mouth once daily   PANTOPRAZOLE (PROTONIX) 40 MG TABLET    Take 1 tablet by mouth once daily   POTASSIUM CHLORIDE SA (KLOR-CON M20) 20 MEQ TABLET    TAKE 1  BY MOUTH ONCE DAILY   SILDENAFIL (REVATIO) 20 MG TABLET    TAKE 1 TABLET BY MOUTH THREE TIMES DAILY   SITAGLIPTIN (JANUVIA) 100 MG TABLET    Take 1 tablet by mouth once daily  Modified Medications   No medications on file  Discontinued Medications   SPIRONOLACTONE (ALDACTONE) 25 MG TABLET    Take 1 tablet by mouth once daily    Physical Exam:  Vitals:   11/20/21 1435   BP: (!) 122/58  Pulse: 61  Resp: 16  Temp: (!) 97.4 F (36.3 C)  TempSrc: Temporal  SpO2: 99%  Weight: 132 lb (59.9 kg)  Height: '4\' 11"'$  (1.499 m)   Body mass index is 26.66 kg/m. Wt Readings from Last 3 Encounters:  11/20/21 132 lb (59.9 kg)  09/25/21 129 lb 12.8 oz (58.9 kg)  08/22/21 130 lb 3.2 oz (59.1 kg)    Physical Exam Vitals and nursing note reviewed.  Constitutional:      Appearance: Normal appearance.  Cardiovascular:     Rate and Rhythm: Normal rate.  Pulmonary:     Effort: Pulmonary effort is normal.  Musculoskeletal:     Comments: There is no tenderness over the sciatic notch but more over the SI joint on the right side.  Straight leg raising is negative.  Reflexes are symmetric.  Neurological:     Mental Status: She is alert.     Labs reviewed: Basic Metabolic Panel: Recent Labs    07/25/21 1638  NA 137  K 4.7  CL 98  CO2 34*  GLUCOSE 111*  BUN 14  CREATININE 1.23*  CALCIUM 9.4  TSH 6.42*   Liver Function Tests: No results for input(s): "AST", "ALT", "ALKPHOS", "BILITOT", "PROT", "ALBUMIN" in the last 8760 hours. No results for input(s): "LIPASE", "AMYLASE" in the last 8760 hours. No results for input(s): "AMMONIA" in the last 8760 hours. CBC: Recent Labs    08/10/21 1346  WBC 11.6*  NEUTROABS 9,988*  HGB 13.6  HCT 41.1  MCV 89.5  PLT 268   Lipid Panel: No results for input(s): "CHOL", "HDL", "LDLCALC", "TRIG", "CHOLHDL", "LDLDIRECT" in the last 8760 hours. TSH: Recent Labs    07/25/21 1638  TSH 6.42*   A1C: [name] Results  Component Value Date   HGBA1C 6.2 (H) 11/20/2021     Assessment/Plan  1. Controlled type 2 diabetes mellitus with complication, without long-term current use of insulin (HCC) Most recent A1c was 6.3 and will be repeated today  2. Portal  hypertension ([place]) Patient is asymptomatic but problem is managed by GI  3. Atrial fibrillation, unspecified type ([place]) Continues with diltiazem.  Followed by  cardiology  4. Essential hypertension Blood pressures well controlled on diltiazem today pressures 122/58  5. Right hip pain Secondary to moderate degenerative arthritis.  Continue with Tylenol arthritis strength and application of local heat may need to modify activity level over time   Alain Honey, MD [place] [phone]

## 2021-11-27 ENCOUNTER — Other Ambulatory Visit: Payer: Self-pay | Admitting: Otolaryngology

## 2021-11-27 ENCOUNTER — Ambulatory Visit: Payer: Medicare Other | Admitting: Emergency Medicine

## 2021-11-27 DIAGNOSIS — R1310 Dysphagia, unspecified: Secondary | ICD-10-CM

## 2021-11-27 DIAGNOSIS — E049 Nontoxic goiter, unspecified: Secondary | ICD-10-CM | POA: Diagnosis not present

## 2021-11-29 ENCOUNTER — Encounter: Payer: Self-pay | Admitting: Emergency Medicine

## 2021-11-29 ENCOUNTER — Ambulatory Visit (INDEPENDENT_AMBULATORY_CARE_PROVIDER_SITE_OTHER): Payer: Medicare Other | Admitting: Emergency Medicine

## 2021-11-29 DIAGNOSIS — R0683 Snoring: Secondary | ICD-10-CM

## 2021-11-29 DIAGNOSIS — E042 Nontoxic multinodular goiter: Secondary | ICD-10-CM

## 2021-11-29 DIAGNOSIS — I2510 Atherosclerotic heart disease of native coronary artery without angina pectoris: Secondary | ICD-10-CM | POA: Diagnosis not present

## 2021-11-29 NOTE — Addendum Note (Signed)
Addended by: Priscille Kluver on: 11/29/2021 04:19 PM   Modules accepted: Orders

## 2021-11-29 NOTE — Progress Notes (Signed)
Subjective:    Patient ID: Hailey [name], female    DOB: 30-Mar-1946, 76 y.o.   MRN: [id number]  HPI  ROV 08/22/21 --76 year old woman with atrial fibrillation, chronic diastolic CHF, systolic hypertension and multifactorial secondary pulmonary hypertension.  She also has restrictive lung disease on pulmonary function testing, a positive ANA of unclear significance.  She has hyperthyroidism with a goiter.  She reestablish care with me last month because she was dealing with progressive exertional shortness of breath as well as some dysphagia.  We performed CT scan of the chest and neck as below. Her swallowing is unchanged, still has to drink liquids and re-swallow to get food to transit. She sleeps upright. She snores, can snore herself awake. No witnessed apneas.   CT scan of the chest 08/16/2021 reviewed by me showed a slowly enlarging heterogeneous thyroid compatible with large goiter.  Slightly enlarged compared with prior most recently 05/2019 without any clear impact on the trachea or esophagus.  Her multiple pulmonary nodules and scattered lymphadenopathy in the chest are unchanged compared with 2020, reassuring.  ROV 11/29/2021 --76 year old woman with restrictive lung disease and multifactorial secondary pulmonary hypertension in the setting of A-fib, chronic diastolic CHF, systolic hypertension.  She has an ANA of unclear significance, hypothyroidism with a goiter.  I referred her to ENT regarding the goiter, appeared to be larger on CT scan of the neck but not impacting the trachea..  We also arranged for home sleep study > done on 11/04/2021 and showed mild OSA with an AHI 5.6/h, desaturation nadir 81%. She is planning to have a swallowing eval on advice of Dr Donne Anon, planning to see Endocrinology Her breathing is stable. She is able to do housework but has to stop to rest. Occurs w walking as well    Review of Systems  Constitutional:  Positive for unexpected weight change. Negative for  fever.  HENT:  Negative for congestion, dental problem, ear pain, nosebleeds, postnasal drip, rhinorrhea, sinus pressure, sneezing, sore throat and trouble swallowing.   Eyes:  Negative for redness and itching.  Respiratory:  Positive for cough and shortness of breath. Negative for chest tightness and wheezing.   Cardiovascular:  Negative for palpitations and leg swelling.  Gastrointestinal:  Negative for nausea and vomiting.  Genitourinary:  Negative for dysuria.  Musculoskeletal:  Negative for joint swelling.  Skin:  Negative for rash.  Allergic/Immunologic: Negative.  Negative for environmental allergies, food allergies and immunocompromised state.  Neurological:  Negative for headaches.  Hematological:  Does not bruise/bleed easily.  Psychiatric/Behavioral:  Negative for dysphoric mood. The patient is not nervous/anxious.      Past Medical History:  Diagnosis Date   Acute on chronic diastolic CHF (congestive heart failure), NYHA class 1 (HCC) 04/29/2013   Allergic rhinitis    Atrial fibrillation (HCC)    Pacemaker   Brady-tachy syndrome (HCC)    s/p STJ dual chamber PPM by Dr Sallyanne Kuster   CAD (coronary artery disease)    70% RCA stenosis, treated medically   Control of atrial fibrillation with pacemaker ([place])    Diabetes mellitus ([place])    DJD (degenerative joint disease)    Dysrhythmia    Paroxysmal Atrial Fib.   Hypertension    Hyperthyroidism    Paroxysmal atrial fibrillation (HCC)    Polio    as a child   PONV (postoperative nausea and vomiting)    Presence of permanent cardiac pacemaker    Pulmonary disease      Family History  Problem Relation Age of Onset   Cancer Brother    Heart attack Sister    Breast cancer Sister    Diabetes Sister    Heart disease Sister    Breast cancer Sister    Dementia Sister    Colon cancer Sister    Heart disease Sister    Breast cancer Sister    Suicidality Brother    Prostate cancer Brother    Dementia Brother       Social History   Socioeconomic History   Marital status: Married    Spouse name: [name]   Number of children: 3   Years of education: 13   Highest education level: Some college, no degree  Occupational History   Occupation: housewife  Tobacco Use   Smoking status: Never   Smokeless tobacco: Never  Vaping Use   Vaping Use: Never used  Substance and Sexual Activity   Alcohol use: No   Drug use: No   Sexual activity: Not Currently    Birth control/protection: Abstinence  Other Topics Concern   Not on file  Social History Narrative   Lives in [place].  Retired.  Was office clerk in several companies.  Did some technical school after completing high school.  She has no pets.  She and her husband live in a one-story home.  They've been married since 1965.  She follows a heart healthy diet.  She has a living will and hcpoa but does not have a DNR form.  She does struggle to prepare food.     Social Determinants of Health   Financial Resource Strain: Not on file  Food Insecurity: Not on file  Transportation Needs: Not on file  Physical Activity: Not on file  Stress: Not on file  Social Connections: Not on file  Intimate Partner Violence: Not on file     Allergies  Allergen Reactions   Codeine Nausea And Vomiting and Nausea Only   Ketorolac Nausea And Vomiting, Nausea Only and Other (See Comments)    "pt told to never take this med"   Meperidine Nausea Only   Demerol Nausea And Vomiting   Dronedarone Other (See Comments)    Increased fluid retention   Cyclobenzaprine Nausea And Vomiting     Outpatient Medications Prior to Visit  Medication Sig Dispense Refill   apixaban (ELIQUIS) 5 MG TABS tablet Take 1 tablet (5 mg total) by mouth 2 (two) times daily. 60 tablet 11   Cholecalciferol (VITAMIN D3) 1000 UNITS CAPS Take 1,000 Units by mouth daily.     Cyanocobalamin (VITAMIN B12 PO) Take 1 tablet by mouth daily as needed (for energy).     furosemide (LASIX) 80 MG tablet  Take 1 tablet (80 mg total) by mouth daily. 90 tablet 3   glucose blood (ONETOUCH ULTRA) test strip USE DAILY TO CHECK BLOOD SUGAR E11.8 100 each 11   Lancets Misc. (ONE TOUCH SURESOFT) MISC Use to test  Blood sugar once daily DX: E11.8, 1 each 0   loperamide (IMODIUM) 2 MG capsule Take 2 mg by mouth as needed for diarrhea or loose stools.      methimazole (TAPAZOLE) 10 MG tablet TAKE 1 TABLET BY MOUTH ONCE DAILY WITH FOOD FOR THYROID 90 tablet 1   metoprolol tartrate (LOPRESSOR) 50 MG tablet TAKE 1 TABLET BY MOUTH IN THE MORNING AND 1/2 TABLET BY MOUTH IN THE EVENING 135 tablet 1   ondansetron (ZOFRAN) 4 MG tablet Take 1 tablet (4 mg total) by mouth every  8 (eight) hours as needed for nausea or vomiting. 20 tablet 0   oxybutynin (DITROPAN) 5 MG tablet Take 1 tablet by mouth once daily 90 tablet 0   pantoprazole (PROTONIX) 40 MG tablet Take 1 tablet by mouth once daily 90 tablet 1   potassium chloride SA (KLOR-CON M20) 20 MEQ tablet TAKE 1  BY MOUTH ONCE DAILY 90 tablet 1   sildenafil (REVATIO) 20 MG tablet TAKE 1 TABLET BY MOUTH THREE TIMES DAILY 90 tablet 6   sitaGLIPtin (JANUVIA) 100 MG tablet Take 1 tablet by mouth once daily 90 tablet 1   furosemide (LASIX) 80 MG tablet Take 1 tablet by mouth once daily 90 tablet 0   No facility-administered medications prior to visit.       Objective:   Physical Exam Vitals:   11/29/21 1523  BP: 98/62  Pulse: 60  SpO2: 97%  Weight: 133 lb (60.3 kg)  Height: '4\' 11"'$  (1.499 m)    Gen: Pleasant, well-nourished elderly woman, in no distress,  normal affect  ENT: No lesions,  mouth clear,  oropharynx clear, no postnasal drip  Neck: JVD difficult to assess, she has a large goiter palpable on the R  Lungs: No use of accessory muscles, no crackles or wheezing on normal respiration, no wheeze on forced expiration  Cardiovascular: RRR, heart sounds normal, no murmur or gallops, no peripheral edema  Musculoskeletal: No deformities, no cyanosis or  clubbing  Neuro: alert, awake, non focal  Skin: Warm, no lesions or rash      Assessment & Plan:  Snoring Mild OSA established on her home sleep study.  We will try starting CPAP 5-20 cmH2O, nasal pillows.  Follow-up in 2 to 3 months to determine compliance, troubleshoot.  Question whether she may have improvement in her upper airway obstruction after she has definitive goiter therapy  Multinodular goiter Appreciate ENT assistance.  She is going to have a swallowing evaluation.  She may ultimately require surgical resection.  Endocrinology evaluation is underway as well.   Baltazar Apo, MD, PhD 11/29/2021, 3:56 PM [facility] [phone] or if no answer [phone]

## 2021-11-29 NOTE — Assessment & Plan Note (Signed)
Appreciate ENT assistance.  She is going to have a swallowing evaluation.  She may ultimately require surgical resection.  Endocrinology evaluation is underway as well.

## 2021-11-29 NOTE — Assessment & Plan Note (Signed)
Mild OSA established on her home sleep study.  We will try starting CPAP 5-20 cmH2O, nasal pillows.  Follow-up in 2 to 3 months to determine compliance, troubleshoot.  Question whether she may have improvement in her upper airway obstruction after she has definitive goiter therapy

## 2021-11-29 NOTE — Patient Instructions (Signed)
We will work on setting up CPAP for you to use every night while sleeping. Agree with follow-up with ENT and with endocrinology. Follow Dr. Lamonte Sakai in 2 to 3 months to review your status on the new CPAP machine.

## 2021-12-05 ENCOUNTER — Ambulatory Visit
Admission: RE | Admit: 2021-12-05 | Discharge: 2021-12-05 | Disposition: A | Discharge status: Home / Self Care | Payer: Medicare Other | Source: Ambulatory Visit | Attending: Otolaryngology | Admitting: Otolaryngology

## 2021-12-05 DIAGNOSIS — R1310 Dysphagia, unspecified: Secondary | ICD-10-CM

## 2021-12-05 DIAGNOSIS — K219 Gastro-esophageal reflux disease without esophagitis: Secondary | ICD-10-CM | POA: Diagnosis not present

## 2022-01-03 ENCOUNTER — Ambulatory Visit: Payer: Medicare Other | Attending: Otolaryngology | Admitting: Speech Pathology

## 2022-01-03 ENCOUNTER — Encounter: Payer: Self-pay | Admitting: Speech Pathology

## 2022-01-03 DIAGNOSIS — R1310 Dysphagia, unspecified: Secondary | ICD-10-CM | POA: Insufficient documentation

## 2022-01-03 NOTE — Patient Instructions (Signed)
Pills:  Try taking pills with different consistencies. Thicker may help the pills slide down more easily.  Pudding, applesauce, protein or milk shake, tomato juice, milk (can do milk alternatives)   Continue taking 1 at a time  Ask your pharmacist if any pills can be crushed or halved. Or can ask if theres a liquid form.  Eating: Take small bites, can be easier to do that if you precut your food into small pieces prior to eating  Slow down, chew your food longer than you think is necessary. Set your fork or spoon between bites.   Avoid distractions so you can focus on slowing down.   Consider making food "saucy" -- add sauces, gravies, butter/mayo, etc. Especially challenging foods   Use liquid wash-- drink with your food

## 2022-01-03 NOTE — Therapy (Signed)
OUTPATIENT SPEECH LANGUAGE PATHOLOGY SWALLOW EVALUATION   Patient Name: Hailey [name] MRN: [id number] DOB:1945/10/13, 76 y.o., female Today's Date: 01/03/2022  PCP: Bertram Gala, MD REFERRING PROVIDER: Jason Coop, DO   End of Session - 01/03/22 1602     Visit Number 1    Number of Visits 1    SLP Start Time 1400    SLP Stop Time  1449    SLP Time Calculation (min) 49 min    Activity Tolerance Patient tolerated treatment well             Past Medical History:  Diagnosis Date   Acute on chronic diastolic CHF (congestive heart failure), NYHA class 1 ([place]) 04/29/2013   Allergic rhinitis    Atrial fibrillation ([place])    Pacemaker   Brady-tachy syndrome (HCC)    s/p STJ dual chamber PPM by Dr Sallyanne Kuster   CAD (coronary artery disease)    70% RCA stenosis, treated medically   Control of atrial fibrillation with pacemaker ([place])    Diabetes mellitus ([place])    DJD (degenerative joint disease)    Dysrhythmia    Paroxysmal Atrial Fib.   Hypertension    Hyperthyroidism    Paroxysmal atrial fibrillation (HCC)    Polio    as a child   PONV (postoperative nausea and vomiting)    Presence of permanent cardiac pacemaker    Pulmonary disease    Past Surgical History:  Procedure Laterality Date   arm surgery d/t fx Right 28   ATRIAL FIBRILLATION ABLATION  07/31/2012; 08-10-13   PVI by Dr Rayann Heman   ATRIAL FIBRILLATION ABLATION N/A 07/31/2012   Procedure: ATRIAL FIBRILLATION ABLATION;  Surgeon: Thompson Grayer, MD;  Location[facility] CATH LAB;  Service: Cardiovascular;  Laterality: N/A;   ATRIAL FIBRILLATION ABLATION N/A 08/10/2013   Procedure: ATRIAL FIBRILLATION ABLATION;  Surgeon: Coralyn Mark, MD;  Location: [facility] CATH LAB;  Service: Cardiovascular;  Laterality: N/A;   BACK SURGERY  07/26/2012   dR. [facility]   CARDIAC CATHETERIZATION  12/03/2010   single vessel mid RCA   CARDIAC ELECTROPHYSIOLOGY MAPPING AND ABLATION  07/31/2012   Dr. Thompson Grayer   CESAREAN SECTION   09/20/1974   Dr. Gertie Fey   CHOLECYSTECTOMY  1994   COLONOSCOPY WITH PROPOFOL N/A 03/21/2015   Procedure: COLONOSCOPY WITH PROPOFOL;  Surgeon: Juanita Craver, MD;  Location: [facility] ENDOSCOPY;  Service: Endoscopy;  Laterality: N/A;   DIAGNOSTIC MAMMOGRAM  04/16/2017   Hedwig Morton. Morris, DO   LAPAROSCOPIC HYSTERECTOMY  Late 19s to early 16s   LEFT HEART CATH AND CORONARY ANGIOGRAPHY N/A 07/30/2017   Procedure: LEFT HEART CATH AND CORONARY ANGIOGRAPHY;  Surgeon: Troy Sine, MD;  Location: [facility] CV LAB;  Service: Cardiovascular;  Laterality: N/A;   LUMBAR LAMINECTOMY/DECOMPRESSION MICRODISCECTOMY Right 12/23/2012   Procedure: Right L5-S1 Laminotomy for resection of synovial cyst;  Surgeon: Hosie Spangle, MD;  Location: [facility] NEURO ORS;  Service: Neurosurgery;  Laterality: Right;  Right Lumbar five-sacral one Laminotomy for resection of synovial cyst   NM MYOCAR PERF WALL MOTION  12/20/2010   normal   PACEMAKER IMPLANT N/A 08/09/2019   Procedure: PACEMAKER IMPLANT;  Surgeon: Sanda Klein, MD;  Location: [facility] CV LAB;  Service: Cardiovascular;  Laterality: N/A;   PACEMAKER INSERTION  12/03/10   SJM Accent DR RF implanted by Dr Sallyanne Kuster   RIGHT HEART CATH N/A 04/22/2018   Procedure: RIGHT HEART CATH;  Surgeon: Wellington Hampshire, MD;  Location: [facility] CV LAB;  Service: Cardiovascular;  Laterality: N/A;   TEE WITHOUT CARDIOVERSION N/A 07/30/2012   Procedure: TRANSESOPHAGEAL ECHOCARDIOGRAM (TEE);  Surgeon: Sanda Klein, MD;  Location: [facility] ENDOSCOPY;  Service: Cardiovascular;  Laterality: N/A;  h&p in [facility]   TEE WITHOUT CARDIOVERSION N/A 08/09/2013   Procedure: TRANSESOPHAGEAL ECHOCARDIOGRAM (TEE);  Surgeon: Lelon Perla, MD;  Location: [facility] ENDOSCOPY;  Service: Cardiovascular;  Laterality: N/A;   TOTAL ABDOMINAL HYSTERECTOMY  1981   ULTRASOUND GUIDANCE FOR VASCULAR ACCESS  04/22/2018   Procedure: Ultrasound Guidance For Vascular Access;  Surgeon: Wellington Hampshire, MD;  Location: [facility] CV LAB;  Service: Cardiovascular;;   Patient Active Problem List   Diagnosis Date Noted   Right hip pain 09/25/2021   Snoring 08/22/2021   Longstanding persistent atrial fibrillation ([place]) 07/10/2021   Pacemaker battery depletion 08/09/2019   Portal hypertension ([place]) 06/14/2019   Controlled type 2 diabetes mellitus with complication, without long-term current use of insulin ([place]) 12/17/2018   Hypercoagulable state due to atrial fibrillation ([place]) 12/17/2018   PAH (pulmonary artery hypertension) ([place]) 06/01/2018   SSS (sick sinus syndrome) ([place])    Hyperthyroidism 09/08/2017   Essential hypertension 09/08/2017   Abnormal cardiovascular stress test    Pacemaker lead failure, initial encounter 05/26/2017   Multinodular goiter 02/20/2017   TMJ pain dysfunction syndrome 02/20/2017   Long term current use of anticoagulant 10/11/2015   Second degree AV block 10/10/2015   Atherosclerosis of native coronary artery of native heart with angina pectoris ([place]) 08/28/2013   Nausea, vomiting, and diarrhea 05/27/2013   Chronic diastolic heart failure ([place]) 05/26/2013   Pacemaker [employer] Jude accent DR RF , July 2012 03/25/2013   Atrial fibrillation ([place]) 03/02/2012   Symptomatic sinus bradycardia 03/02/2012   Pulmonary nodule 02/18/2011   Mediastinal lymphadenopathy 02/18/2011    ONSET DATE: referral July 2023   REFERRING DIAG: R13.10 (ICD-10-CM) - Dysphagia, unspecified   THERAPY DIAG:  Dysphagia, unspecified type  Rationale for Evaluation and Treatment Rehabilitation  SUBJECTIVE:   SUBJECTIVE STATEMENT: "Sometimes my pills get stuck" Pt accompanied by: family member  PERTINENT HISTORY: Pt with longstanding hx of enlarged thyroid. Last few months reporting dysphagia, with some solids and daily with pill administration.   PAIN:  Are you having pain? No  FALLS: Has patient fallen in last 6 months?  No  LIVING ENVIRONMENT: Lives with: lives with their family Lives in:  House/apartment  PLOF:  Level of assistance: Independent with IADLs Employment: Retired   PATIENT GOALS "make swallowing easier"  OBJECTIVE:   DIAGNOSTIC FINDINGS: Flexible laryngoscopy shows patent anterior nasal cavity with minimal crusting, no discharge or infection.  Normal base of tongue and supraglottis Normal vocal cord mobility without vocal cord nodule, mass, polyp or tumor. No evidence of deviation of larynx secondary to mass effect. Hypopharynx normal without mass, pooling of secretions or aspiration.   DG ESOPHAGUS W DOUBLE CM IMPRESSION: 1. Mild esophageal deviation to the right and mild circumferential narrowing of the esophageal lumen in the lower neck by the patient's multinodular goiter. There was temporary delay in passage of the 13 mm barium tablet, although the tablet did eventually pass into the stomach. 2. The thoracic esophagus appears normal. 3. No laryngeal penetration.  Minimal gastroesophageal reflux.  COGNITION: Overall cognitive status: Within functional limits for tasks assessed   ORAL MOTOR EXAMINATION Overall status: WFL  CLINICAL SWALLOW ASSESSMENT:   Current diet: regular Dentition: adequate natural dentition Patient directly observed with POs: No Feeding: able to feed self Oral phase signs and  symptoms:  none reported Pharyngeal phase signs and symptoms: complaints of residue with rapid oral intake and pill administration, per pt reprot   TODAY'S TREATMENT:  Education on swallow mechanics; SLP reviewed report from barium swallow. Based on report, MBSS does not appear indicated. SLP trained on swallow compensations. Daughter and pt verbalize understanding and agreement with POC.     PATIENT EDUCATION: Education details: see above Person educated: Patient and Child(ren) Education method: Explanation, Demonstration, and Handouts Education comprehension: verbalized understanding and returned demonstration   ASSESSMENT:  CLINICAL  IMPRESSION: Patient is a 76 y.o. F who was seen today for dysphagia. Pt presenting with mild dyspahgia primarily characterized by difficulty with pill administration and dry or overly chewy foods. Case history and review of objective barium swallow study indicate that pt mild dysphagia is likley 2/2 deviation of esophagus and mild circumferential narrowing of esophageal lumen. Additionally laryngeal protection adequate for study. SLP provides extensive education on swallow compensations and strategies to aid in improved comfort with PO. Pt able to teach back strategies given visual aid.   PLAN: SLP FREQUENCY: one time visit  SLP DURATION: other: eval and discharge  PLANNED INTERVENTIONS: Aspiration precaution training and Diet toleration management     Su Monks, Lena 01/03/2022, 4:36 PM

## 2022-01-08 ENCOUNTER — Other Ambulatory Visit: Payer: Self-pay | Admitting: Family Medicine

## 2022-01-15 NOTE — Progress Notes (Unsigned)
$'@Patient'X$  ID: Hailey [name], female    DOB: 08/29/1945, 76 y.o.   MRN: [id number]  No chief complaint on file.   Referring provider: Wardell Honour, MD  HPI: 76 year old female, never smoker followed for restrictive lung disease, pulmonary nodules, mild OSA and PAH. She is a patient of Dr. Agustina Caroli and last seen in office on 11/29/2021. Past medical history significant for diastolic CHF, SSS, PAF on chronic anticoagulation, HTN, multinodular goiter, DM II,   TEST/EVENTS:  08/17/2021 CT Chest w contrast: there is marked enlargement of the thyroid. Right hilar nodes stable dating back to 2019. Scattered pulmonary nodules through the chest and stable.  08/17/2021 CT neck: heterogeneous enlargement of the thyroid with innumerable presumed underlying nodules and coarse calcifications. There is encircling of the trachea without worrisome narrowing 11/04/2021 HST: AHI 5.4, SpO2 low 81%  11/29/2021: OV with Dr. Lamonte Sakai. Multifactorial secondary pulmonary HTN in the setting of a fib, chronic diastolic CHF, systolic HTN. ANA of unclear significant. Breathing stable. Referred to ENT for further evaluation of thyroid goiter. Appeared larger on CT neck but not impacting the trachea. Plans for swallow eval and referral to endocrinology. HST was previously ordered and completed on 11/04/2021 which showed mild OSA with AHI 5.6/h, SpO2 low 81%. Recommended starting on CPAP 5-20 cmH2O.    Allergies  Allergen Reactions   Codeine Nausea And Vomiting and Nausea Only   Ketorolac Nausea And Vomiting, Nausea Only and Other (See Comments)    "pt told to never take this med"   Meperidine Nausea Only   Demerol Nausea And Vomiting   Dronedarone Other (See Comments)    Increased fluid retention   Cyclobenzaprine Nausea And Vomiting    Immunization History  Administered Date(s) Administered   Fluad Quad(high Dose 65+) 01/21/2019, 03/27/2020, 05/22/2021   Influenza Whole 02/19/2013   Influenza, High Dose Seasonal PF  02/09/2018   PFIZER(Purple Top)SARS-COV-2 Vaccination 07/08/2019, 07/29/2019   Pfizer Covid-19 Vaccine Bivalent Booster 59yr & up 05/12/2020, 05/04/2021   Pneumococcal Polysaccharide-23 02/09/2018   Pneumococcal-Unspecified 04/29/2011   Tdap 03/05/2013    Past Medical History:  Diagnosis Date   Acute on chronic diastolic CHF (congestive heart failure), NYHA class 1 ([facility] 04/29/2013   Allergic rhinitis    Atrial fibrillation (HCC)    Pacemaker   Brady-tachy syndrome (HCC)    s/p STJ dual chamber PPM by Dr CSallyanne Kuster  CAD (coronary artery disease)    70% RCA stenosis, treated medically   Control of atrial fibrillation with pacemaker ([facility]    Diabetes mellitus ([facility]    DJD (degenerative joint disease)    Dysrhythmia    Paroxysmal Atrial Fib.   Hypertension    Hyperthyroidism    Paroxysmal atrial fibrillation (HCC)    Polio    as a child   PONV (postoperative nausea and vomiting)    Presence of permanent cardiac pacemaker    Pulmonary disease     Tobacco History: Social History   Tobacco Use  Smoking Status Never  Smokeless Tobacco Never   Counseling given: Not Answered   Outpatient Medications Prior to Visit  Medication Sig Dispense Refill   apixaban (ELIQUIS) 5 MG TABS tablet Take 1 tablet (5 mg total) by mouth 2 (two) times daily. 60 tablet 11   Cholecalciferol (VITAMIN D3) 1000 UNITS CAPS Take 1,000 Units by mouth daily.     Cyanocobalamin (VITAMIN B12 PO) Take 1 tablet by mouth daily as needed (for energy).     furosemide (LASIX) 80  MG tablet Take 1 tablet (80 mg total) by mouth daily. 90 tablet 3   glucose blood (ONETOUCH ULTRA) test strip USE DAILY TO CHECK BLOOD SUGAR E11.8 100 each 11   Lancets Misc. (ONE TOUCH SURESOFT) MISC Use to test  Blood sugar once daily DX: E11.8, 1 each 0   loperamide (IMODIUM) 2 MG capsule Take 2 mg by mouth as needed for diarrhea or loose stools.      methimazole (TAPAZOLE) 10 MG tablet TAKE 1 TABLET BY MOUTH ONCE DAILY WITH FOOD  FOR THYROID 90 tablet 1   metoprolol tartrate (LOPRESSOR) 50 MG tablet TAKE 1 TABLET BY MOUTH IN THE MORNING AND 1/2 TABLET BY MOUTH IN THE EVENING 135 tablet 1   ondansetron (ZOFRAN) 4 MG tablet Take 1 tablet (4 mg total) by mouth every 8 (eight) hours as needed for nausea or vomiting. 20 tablet 0   oxybutynin (DITROPAN) 5 MG tablet Take 1 tablet by mouth once daily 90 tablet 0   pantoprazole (PROTONIX) 40 MG tablet Take 1 tablet by mouth once daily 90 tablet 1   potassium chloride SA (KLOR-CON M20) 20 MEQ tablet TAKE 1  BY MOUTH ONCE DAILY 90 tablet 1   sildenafil (REVATIO) 20 MG tablet TAKE 1 TABLET BY MOUTH THREE TIMES DAILY 90 tablet 6   sitaGLIPtin (JANUVIA) 100 MG tablet Take 1 tablet by mouth once daily 90 tablet 1   No facility-administered medications prior to visit.     Review of Systems:   Constitutional: No weight loss or gain, night sweats, fevers, chills, fatigue, or lassitude. HEENT: No headaches, difficulty swallowing, tooth/dental problems, or sore throat. No sneezing, itching, ear ache, nasal congestion, or post nasal drip CV:  No chest pain, orthopnea, PND, swelling in lower extremities, anasarca, dizziness, palpitations, syncope Resp: No shortness of breath with exertion or at rest. No excess mucus or change in color of mucus. No productive or non-productive. No hemoptysis. No wheezing.  No chest wall deformity GI:  No heartburn, indigestion, abdominal pain, nausea, vomiting, diarrhea, change in bowel habits, loss of appetite, bloody stools.  GU: No dysuria, change in color of urine, urgency or frequency.  No flank pain, no hematuria  Skin: No rash, lesions, ulcerations MSK:  No joint pain or swelling.  No decreased range of motion.  No back pain. Neuro: No dizziness or lightheadedness.  Psych: No depression or anxiety. Mood stable.     Physical Exam:  There were no vitals taken for this visit.  GEN: Pleasant, interactive, well-nourished/chronically-ill  appearing/acutely-ill appearing/poorly-nourished/morbidly obese; in no acute distress.****** HEENT:  Normocephalic and atraumatic. EACs patent bilaterally. TM pearly gray with present light reflex bilaterally. PERRLA. Sclera white. Nasal turbinates pink, moist and patent bilaterally. No rhinorrhea present. Oropharynx pink and moist, without exudate or edema. No lesions, ulcerations, or postnasal drip.  NECK:  Supple w/ fair ROM. No JVD present. Normal carotid impulses w/o bruits. Thyroid symmetrical with no goiter or nodules palpated. No lymphadenopathy.   CV: RRR, no m/r/g, no peripheral edema. Pulses intact, +2 bilaterally. No cyanosis, pallor or clubbing. PULMONARY:  Unlabored, regular breathing. Clear bilaterally A&P w/o wheezes/rales/rhonchi. No accessory muscle use. No dullness to percussion. GI: BS present and normoactive. Soft, non-tender to palpation. No organomegaly or masses detected. No CVA tenderness. MSK: No erythema, warmth or tenderness. Cap refil <2 sec all extrem. No deformities or joint swelling noted.  Neuro: A/Ox3. No focal deficits noted.   Skin: Warm, no lesions or rashe Psych: Normal affect and behavior. Judgement  and thought content appropriate.     Lab Results:  CBC    Component Value Date/Time   WBC 11.6 (H) 08/10/2021 1346   RBC 4.59 08/10/2021 1346   HGB 13.6 08/10/2021 1346   HGB 13.9 08/05/2019 1416   HCT 41.1 08/10/2021 1346   HCT 40.8 08/05/2019 1416   PLT 268 08/10/2021 1346   PLT 307 08/05/2019 1416   MCV 89.5 08/10/2021 1346   MCV 87 08/05/2019 1416   MCH 29.6 08/10/2021 1346   MCHC 33.1 08/10/2021 1346   RDW 12.1 08/10/2021 1346   RDW 12.1 08/05/2019 1416   LYMPHSABS 847 (L) 08/10/2021 1346   LYMPHSABS 1.2 11/04/2018 1128   MONOABS 0.5 04/25/2018 0449   EOSABS 35 08/10/2021 1346   EOSABS 0.1 11/04/2018 1128   BASOSABS 35 08/10/2021 1346   BASOSABS 0.0 11/04/2018 1128    BMET    Component Value Date/Time   NA 137 07/25/2021 1638   NA  138 08/05/2019 1416   K 4.7 07/25/2021 1638   CL 98 07/25/2021 1638   CO2 34 (H) 07/25/2021 1638   GLUCOSE 111 (H) 07/25/2021 1638   BUN 14 07/25/2021 1638   BUN 20 08/05/2019 1416   CREATININE 1.23 (H) 07/25/2021 1638   CREATININE 1.22 (H) 11/13/2020 0955   CALCIUM 9.4 07/25/2021 1638   GFRNONAA 43 (L) 11/13/2020 0955   GFRAA 50 (L) 11/13/2020 0955    BNP    Component Value Date/Time   BNP 308.2 (H) 11/04/2018 1105   BNP 393.4 (H) 04/19/2018 1140     Imaging:  No results found.       Latest Ref Rng & Units 07/09/2018   10:53 AM 04/22/2018    5:11 PM 09/29/2017   12:43 PM  PFT Results  FVC-Pre L 1.80  1.44  1.35   FVC-Predicted Pre % 78  62  58   FVC-Post L 1.87     FVC-Predicted Post % 81     Pre FEV1/FVC % % 80  77  83   Post FEV1/FCV % % 80     FEV1-Pre L 1.44  1.10  1.11   FEV1-Predicted Pre % 83  64  64   FEV1-Post L 1.51     DLCO uncorrected ml/min/mmHg 13.18  7.74  9.60   DLCO UNC% % 81  44  54   DLCO corrected ml/min/mmHg  8.91    DLCO COR %Predicted %  50    DLVA Predicted % 92  87  93   TLC L 3.78  3.42  3.57   TLC % Predicted % 87  79  83   RV % Predicted % 100  98  111     No results found for: "NITRICOXIDE"      Assessment & Plan:   No problem-specific Assessment & Plan notes found for this encounter.   I spent *** minutes of dedicated to the care of this patient on the date of this encounter to include pre-visit review of records, face-to-face time with the patient discussing conditions above, post visit ordering of testing, clinical documentation with the electronic health record, making appropriate referrals as documented, and communicating necessary findings to members of the patients care team.  Clayton Bibles, NP 01/15/2022  Pt aware and understands NP's role.

## 2022-01-16 ENCOUNTER — Encounter: Payer: Self-pay | Admitting: Nurse Practitioner

## 2022-01-16 ENCOUNTER — Ambulatory Visit (INDEPENDENT_AMBULATORY_CARE_PROVIDER_SITE_OTHER): Payer: Medicare Other | Admitting: Nurse Practitioner

## 2022-01-16 VITALS — BP 138/64 | HR 68 | Temp 98.2°F | Ht 59.0 in | Wt 131.0 lb

## 2022-01-16 DIAGNOSIS — R911 Solitary pulmonary nodule: Secondary | ICD-10-CM | POA: Diagnosis not present

## 2022-01-16 DIAGNOSIS — E049 Nontoxic goiter, unspecified: Secondary | ICD-10-CM | POA: Insufficient documentation

## 2022-01-16 DIAGNOSIS — I2721 Secondary pulmonary arterial hypertension: Secondary | ICD-10-CM | POA: Diagnosis not present

## 2022-01-16 DIAGNOSIS — G4733 Obstructive sleep apnea (adult) (pediatric): Secondary | ICD-10-CM | POA: Diagnosis not present

## 2022-01-16 NOTE — Patient Instructions (Addendum)
Continue to use CPAP every night, minimum of 4-6 hours a night.  Change equipment every 30 days or as directed by DME. Wash your tubing with warm soap and water daily, hang to dry. Wash humidifier portion weekly.  Be aware of reduced alertness and do not drive or operate heavy machinery if experiencing this or drowsiness.  Exercise encouraged, as tolerated. Avoid or decrease alcohol consumption and medications that make you more sleepy, if possible. Notify if persistent daytime sleepiness occurs even with consistent use of CPAP.   Mask fitting ordered today - someone will contact you for scheduling  We will adjust your CPAP pressures down to 5-10 cmH2O   Continue lasix 80 mg daily  Continue sildenafil 1 tab Three times a day   Follow up in 3 months with Dr. Lamonte Sakai. If symptoms do not improve or worsen, please contact office for sooner follow up or seek emergency care.

## 2022-01-16 NOTE — Assessment & Plan Note (Signed)
Symptoms are stable. She is aware of red flag symptoms. Dr. Fredric Dine recommended referral to endocrinology and possible radiation. Would prefer to avoid surgery unless absolutely necessary. Currently her endocrine appt is in December; they are attempting to get this pushed up.

## 2022-01-16 NOTE — Assessment & Plan Note (Addendum)
Secondary PAH; multifactorial in setting of chronic a fib, diastolic CHF, systolic HTN. Euvolemic on exam today. Respiratory status is stable. Continue pharmacological therapy with sildenafil and lasix as prescribed.

## 2022-01-16 NOTE — Assessment & Plan Note (Signed)
Stable on most recent imaging in March 2023.

## 2022-01-16 NOTE — Assessment & Plan Note (Signed)
Very mild OSA with AHI 5.6/h. She was set at 5-20 cmH2O with average pressure of 9. Airview download is not accurate - they have been having difficulties with data download due to software being down. She reports that she has been using the machine nightly despite download only showing 2/30 days used. Regardless, she is having trouble with mask leaks and difficulties sleeping related to this. No perceived benefit thus far. We will adjust her pressures to 5-10 and send her for mask fitting. The small nasal pillow mask doesn't seem to fit her face well. Hopefully this will correct the leaks she is experiencing and improve tolerance. Advised her to contact us if this did not help so we can see her sooner.  Patient Instructions  Continue to use CPAP every night, minimum of 4-6 hours a night.  Change equipment every 30 days or as directed by DME. Wash your tubing with warm soap and water daily, hang to dry. Wash humidifier portion weekly.  Be aware of reduced alertness and do not drive or operate heavy machinery if experiencing this or drowsiness.  Exercise encouraged, as tolerated. Avoid or decrease alcohol consumption and medications that make you more sleepy, if possible. Notify if persistent daytime sleepiness occurs even with consistent use of CPAP.   Mask fitting ordered today - someone will contact you for scheduling  We will adjust your CPAP pressures down to 5-10 cmH2O   Continue lasix 80 mg daily  Continue sildenafil 1 tab Three times a day   Follow up in 3 months with Dr. Lamonte Sakai. If symptoms do not improve or worsen, please contact office for sooner follow up or seek emergency care.

## 2022-01-24 ENCOUNTER — Other Ambulatory Visit: Payer: Self-pay | Admitting: Cardiovascular Disease

## 2022-02-06 ENCOUNTER — Ambulatory Visit (INDEPENDENT_AMBULATORY_CARE_PROVIDER_SITE_OTHER): Payer: Medicare Other

## 2022-02-06 DIAGNOSIS — I495 Sick sinus syndrome: Secondary | ICD-10-CM | POA: Diagnosis not present

## 2022-02-06 LAB — CUP PACEART REMOTE DEVICE CHECK
Battery Remaining Longevity: 115 mo
Battery Remaining Percentage: 81 %
Battery Voltage: 3.01 V
Brady Statistic RV Percent Paced: 96 %
Date Time Interrogation Session: 20230906040013
Implantable Lead Implant Date: 20120702
Implantable Lead Implant Date: 20120702
Implantable Lead Implant Date: 20210308
Implantable Lead Location: 753859
Implantable Lead Location: 753860
Implantable Lead Location: 753860
Implantable Pulse Generator Implant Date: 20210308
Lead Channel Impedance Value: 640 Ohm
Lead Channel Pacing Threshold Amplitude: 0.625 V
Lead Channel Pacing Threshold Pulse Width: 0.5 ms
Lead Channel Sensing Intrinsic Amplitude: 11.2 mV
Lead Channel Setting Pacing Amplitude: 0.875
Lead Channel Setting Pacing Pulse Width: 0.5 ms
Lead Channel Setting Sensing Sensitivity: 2 mV
Pulse Gen Model: 2272
Pulse Gen Serial Number: 3801383

## 2022-02-19 ENCOUNTER — Other Ambulatory Visit (HOSPITAL_BASED_OUTPATIENT_CLINIC_OR_DEPARTMENT_OTHER): Payer: Medicare Other

## 2022-02-20 ENCOUNTER — Other Ambulatory Visit: Payer: Self-pay | Admitting: Family Medicine

## 2022-02-20 DIAGNOSIS — E118 Type 2 diabetes mellitus with unspecified complications: Secondary | ICD-10-CM

## 2022-02-21 ENCOUNTER — Ambulatory Visit: Payer: Medicare Other | Admitting: Emergency Medicine

## 2022-02-25 NOTE — Progress Notes (Signed)
Remote pacemaker transmission.   

## 2022-03-06 ENCOUNTER — Ambulatory Visit (INDEPENDENT_AMBULATORY_CARE_PROVIDER_SITE_OTHER): Payer: Medicare Other

## 2022-03-06 DIAGNOSIS — Z23 Encounter for immunization: Secondary | ICD-10-CM

## 2022-03-15 ENCOUNTER — Other Ambulatory Visit: Payer: Self-pay | Admitting: Cardiovascular Disease

## 2022-03-18 ENCOUNTER — Other Ambulatory Visit (HOSPITAL_BASED_OUTPATIENT_CLINIC_OR_DEPARTMENT_OTHER): Payer: Medicare Other

## 2022-03-26 ENCOUNTER — Ambulatory Visit (HOSPITAL_BASED_OUTPATIENT_CLINIC_OR_DEPARTMENT_OTHER): Payer: Medicare Other | Attending: Nurse Practitioner | Admitting: Radiology

## 2022-03-26 DIAGNOSIS — G4733 Obstructive sleep apnea (adult) (pediatric): Secondary | ICD-10-CM

## 2022-04-01 ENCOUNTER — Other Ambulatory Visit: Payer: Self-pay | Admitting: Family Medicine

## 2022-04-01 DIAGNOSIS — N3281 Overactive bladder: Secondary | ICD-10-CM

## 2022-04-09 ENCOUNTER — Ambulatory Visit (INDEPENDENT_AMBULATORY_CARE_PROVIDER_SITE_OTHER): Payer: Medicare Other | Admitting: Family

## 2022-04-09 ENCOUNTER — Encounter: Payer: Self-pay | Admitting: Family

## 2022-04-09 VITALS — BP 112/64 | HR 61 | Temp 97.7°F | Ht 59.0 in | Wt 128.2 lb

## 2022-04-09 DIAGNOSIS — J0100 Acute maxillary sinusitis, unspecified: Secondary | ICD-10-CM

## 2022-04-09 DIAGNOSIS — I2510 Atherosclerotic heart disease of native coronary artery without angina pectoris: Secondary | ICD-10-CM | POA: Diagnosis not present

## 2022-04-09 LAB — POC COVID19 BINAXNOW: SARS Coronavirus 2 Ag: NEGATIVE

## 2022-04-09 MED ORDER — DOXYCYCLINE HYCLATE 100 MG PO TABS: 100.0000 mg | ORAL_TABLET | Freq: Two times a day (BID) | ORAL | 0 refills | Status: AC

## 2022-04-09 NOTE — Progress Notes (Signed)
Provider: Lucyle Alumbaugh FNP-C  Wardell Honour, MD  Patient Care Team: Wardell Honour, MD as PCP - General (Family Medicine) Sanda Klein, MD as PCP - Cardiology (Cardiology) Collene Gobble, MD as Consulting Physician (Pulmonary Disease) Marica Otter, [place] (Optometry) Linda Hedges, DO as Consulting Physician (Obstetrics and Gynecology)  Extended Emergency Contact Information Primary Emergency Contact: Elsie Stain of [place] Phone: [phone] Mobile Phone: [phone] Relation: Daughter Secondary Emergency Contact: Haltom,Lisa Address: [address]          [place],  [address] Johnnette Litter of [place] Mobile Phone: [phone] Relation: Daughter  Code Status:  Full Code  Goals of care: Advanced Directive information    01/03/2022    4:01 PM  Advanced Directives  Does Patient Have a Medical Advance Directive? Yes  Does patient want to make changes to medical advance directive? No - Patient declined     Chief Complaint  Patient presents with   Acute Visit    Patient complains of runny nose, and watery eyes.     HPI:  Pt is a 76 y.o. female seen today for an acute visit for evaluation of runny nose and watery eyes.Had " full head " yesterday just kept clearing her.Has pressure over forehead and painful with bending over.she was with her daughter last week who was also having congestion. She denies any fever or chills or cough. No contact with sick person with COVID-19.   Past Medical History:  Diagnosis Date   Acute on chronic diastolic CHF (congestive heart failure), NYHA class 1 (HCC) 04/29/2013   Allergic rhinitis    Atrial fibrillation (HCC)    Pacemaker   Brady-tachy syndrome (HCC)    s/p STJ dual chamber PPM by Dr Sallyanne Kuster   CAD (coronary artery disease)    70% RCA stenosis, treated medically   Control of atrial fibrillation with pacemaker ([place])    Diabetes mellitus ([place])    DJD (degenerative joint disease)     Dysrhythmia    Paroxysmal Atrial Fib.   Hypertension    Hyperthyroidism    Paroxysmal atrial fibrillation (HCC)    Polio    as a child   PONV (postoperative nausea and vomiting)    Presence of permanent cardiac pacemaker    Pulmonary disease    Past Surgical History:  Procedure Laterality Date   arm surgery d/t fx Right 48   ATRIAL FIBRILLATION ABLATION  07/31/2012; 08-10-13   PVI by Dr Rayann Heman   ATRIAL FIBRILLATION ABLATION N/A 07/31/2012   Procedure: ATRIAL FIBRILLATION ABLATION;  Surgeon: Thompson Grayer, MD;  Location: [facility] CATH LAB;  Service: Cardiovascular;  Laterality: N/A;   ATRIAL FIBRILLATION ABLATION N/A 08/10/2013   Procedure: ATRIAL FIBRILLATION ABLATION;  Surgeon: Coralyn Mark, MD;  Location: [facility] CATH LAB;  Service: Cardiovascular;  Laterality: N/A;   BACK SURGERY  07/26/2012   dR. [facility]   CARDIAC CATHETERIZATION  12/03/2010   single vessel mid RCA   CARDIAC ELECTROPHYSIOLOGY MAPPING AND ABLATION  07/31/2012   Dr. Thompson Grayer   CESAREAN SECTION  09/20/1974   Dr. Gertie Fey   CHOLECYSTECTOMY  1994   COLONOSCOPY WITH PROPOFOL N/A 03/21/2015   Procedure: COLONOSCOPY WITH PROPOFOL;  Surgeon: Juanita Craver, MD;  Location: [facility] ENDOSCOPY;  Service: Endoscopy;  Laterality: N/A;   DIAGNOSTIC MAMMOGRAM  04/16/2017   Hedwig Morton. Morris, DO   LAPAROSCOPIC HYSTERECTOMY  Late 22s to early 73s   LEFT HEART CATH AND CORONARY ANGIOGRAPHY N/A 07/30/2017   Procedure: LEFT HEART  CATH AND CORONARY ANGIOGRAPHY;  Surgeon: Troy Sine, MD;  Location: [facility] CV LAB;  Service: Cardiovascular;  Laterality: N/A;   LUMBAR LAMINECTOMY/DECOMPRESSION MICRODISCECTOMY Right 12/23/2012   Procedure: Right L5-S1 Laminotomy for resection of synovial cyst;  Surgeon: Hosie Spangle, MD;  Location: [facility] NEURO ORS;  Service: Neurosurgery;  Laterality: Right;  Right Lumbar five-sacral one Laminotomy for resection of synovial cyst   NM MYOCAR PERF WALL MOTION  12/20/2010   normal   PACEMAKER IMPLANT N/A 08/09/2019    Procedure: PACEMAKER IMPLANT;  Surgeon: Sanda Klein, MD;  Location: [facility] CV LAB;  Service: Cardiovascular;  Laterality: N/A;   PACEMAKER INSERTION  12/03/10   SJM Accent DR RF implanted by Dr Sallyanne Kuster   RIGHT HEART CATH N/A 04/22/2018   Procedure: RIGHT HEART CATH;  Surgeon: Wellington Hampshire, MD;  Location: [facility] CV LAB;  Service: Cardiovascular;  Laterality: N/A;   TEE WITHOUT CARDIOVERSION N/A 07/30/2012   Procedure: TRANSESOPHAGEAL ECHOCARDIOGRAM (TEE);  Surgeon: Sanda Klein, MD;  Location: [facility] ENDOSCOPY;  Service: Cardiovascular;  Laterality: N/A;  h&p in file-hope   TEE WITHOUT CARDIOVERSION N/A 08/09/2013   Procedure: TRANSESOPHAGEAL ECHOCARDIOGRAM (TEE);  Surgeon: Lelon Perla, MD;  Location: [facility] ENDOSCOPY;  Service: Cardiovascular;  Laterality: N/A;   TOTAL ABDOMINAL HYSTERECTOMY  1981   ULTRASOUND GUIDANCE FOR VASCULAR ACCESS  04/22/2018   Procedure: Ultrasound Guidance For Vascular Access;  Surgeon: Wellington Hampshire, MD;  Location: [place] [facility] CV LAB;  Service: Cardiovascular;;    Allergies  Allergen Reactions   Codeine Nausea And Vomiting and Nausea Only   Ketorolac Nausea And Vomiting, Nausea Only and Other (See Comments)    "pt told to never take this med"   Meperidine Nausea Only   Demerol Nausea And Vomiting   Dronedarone Other (See Comments)    Increased fluid retention   Cyclobenzaprine Nausea And Vomiting    Outpatient Encounter Medications as of 04/09/2022  Medication Sig   apixaban (ELIQUIS) 5 MG TABS tablet Take 1 tablet (5 mg total) by mouth 2 (two) times daily.   Cholecalciferol (VITAMIN D3) 1000 UNITS CAPS Take 1,000 Units by mouth daily.   Cyanocobalamin (VITAMIN B12 PO) Take 1 tablet by mouth daily as needed (for energy).   furosemide (LASIX) 80 MG tablet Take 1 tablet by mouth once daily   glucose blood (ONETOUCH ULTRA) test strip USE DAILY TO CHECK BLOOD SUGAR E11.8   Lancets Misc. (ONE TOUCH SURESOFT) MISC Use to test  Blood sugar  once daily DX: E11.8,   loperamide (IMODIUM) 2 MG capsule Take 2 mg by mouth as needed for diarrhea or loose stools.    methimazole (TAPAZOLE) 10 MG tablet TAKE 1 TABLET BY MOUTH ONCE DAILY WITH FOOD FOR THYROID   metoprolol tartrate (LOPRESSOR) 50 MG tablet TAKE 1 TABLET BY MOUTH ONCE DAILY IN THE MORNING THEN TAKE 1/2 (ONE-HALF) TABLET ONCE DAILY IN THE EVENING   ondansetron (ZOFRAN) 4 MG tablet Take 1 tablet (4 mg total) by mouth every 8 (eight) hours as needed for nausea or vomiting.   oxybutynin (DITROPAN) 5 MG tablet Take 1 tablet by mouth once daily   pantoprazole (PROTONIX) 40 MG tablet Take 1 tablet by mouth once daily   potassium chloride SA (KLOR-CON M20) 20 MEQ tablet TAKE 1  BY MOUTH ONCE DAILY   sildenafil (REVATIO) 20 MG tablet TAKE 1 TABLET BY MOUTH THREE TIMES DAILY   sitaGLIPtin (JANUVIA) 100 MG tablet Take 1 tablet by mouth once daily  No facility-administered encounter medications on file as of 04/09/2022.    Review of Systems  Constitutional:  Negative for appetite change, chills, fatigue, fever and unexpected weight change.  HENT:  Positive for rhinorrhea, sinus pressure, sinus pain and sneezing. Negative for congestion, dental problem, ear discharge, ear pain, facial swelling, hearing loss, nosebleeds, postnasal drip, sore throat, tinnitus and trouble swallowing.        Scratchy throat   Eyes:  Negative for pain, discharge, redness, itching and visual disturbance.  Respiratory:  Negative for cough, chest tightness, shortness of breath and wheezing.   Cardiovascular:  Negative for chest pain, palpitations and leg swelling.  Gastrointestinal:  Negative for abdominal distention, abdominal pain, blood in stool, constipation, diarrhea, nausea and vomiting.  Musculoskeletal:  Negative for arthralgias, back pain, gait problem, joint swelling, myalgias, neck pain and neck stiffness.  Skin:  Negative for color change, pallor and rash.  Neurological:  Negative for dizziness,  syncope, speech difficulty, weakness, light-headedness, numbness and headaches.    Immunization History  Administered Date(s) Administered   Fluad Quad(high Dose 65+) 01/21/2019, 03/27/2020, 05/22/2021, 03/06/2022   Influenza Whole 02/19/2013   Influenza, High Dose Seasonal PF 02/09/2018   PFIZER(Purple Top)SARS-COV-2 Vaccination 07/08/2019, 07/29/2019   Pfizer Covid-19 Vaccine Bivalent Booster 28yr & up 05/12/2020, 05/04/2021   Pneumococcal Polysaccharide-23 02/09/2018   Pneumococcal-Unspecified 04/29/2011   Tdap 03/05/2013   Pertinent  Health Maintenance Due  Topic Date Due   OPHTHALMOLOGY EXAM  Never done   FOOT EXAM  11/15/2021   HEMOGLOBIN A1C  05/22/2022   INFLUENZA VACCINE  Completed   DEXA SCAN  Completed   COLONOSCOPY (Pts 45-472yrInsurance coverage will need to be confirmed)  Discontinued      03/27/2020    2:23 PM 05/22/2021    1:33 PM 08/10/2021   11:23 AM 09/25/2021    1:18 PM 11/20/2021    2:34 PM  Fall Risk  Falls in the past year? 0 0 0 0 0  Was there an injury with Fall? 0 0 0 0 0  Fall Risk Category Calculator 0 0 0 0 0  Fall Risk Category Low Low Low Low Low  Patient Fall Risk Level Low fall risk Low fall risk Low fall risk Low fall risk Low fall risk  Patient at Risk for Falls Due to  No Fall Risks No Fall Risks No Fall Risks No Fall Risks  Fall risk Follow up  Falls evaluation completed;Education provided;Falls prevention discussed Falls evaluation completed Falls evaluation completed Falls evaluation completed   Functional Status Survey:    Vitals:   04/09/22 1301  BP: 112/64  Pulse: 61  Temp: 97.7 F (36.5 C)  SpO2: 94%  Weight: 128 lb 3.2 oz (58.2 kg)  Height: '4\' 11"'$  (1.499 m)  PF: 112 L/min   Body mass index is 25.89 kg/m. Physical Exam Vitals reviewed.  Constitutional:      General: She is not in acute distress.    Appearance: Normal appearance. She is overweight. She is not ill-appearing or diaphoretic.  HENT:     Head:  Normocephalic.     Left Ear: Tympanic membrane, ear canal and external ear normal. There is no impacted cerumen.     Ears:     Comments: Right ear partially impacted with cerumen.     Nose: No congestion or rhinorrhea.     Right Turbinates: Not enlarged, swollen or pale.     Left Turbinates: Not enlarged, swollen or pale.     Right Sinus: Maxillary  sinus tenderness and frontal sinus tenderness present.     Left Sinus: Maxillary sinus tenderness and frontal sinus tenderness present.     Mouth/Throat:     Mouth: Mucous membranes are moist. No injury or oral lesions.     Pharynx: Oropharynx is clear. Posterior oropharyngeal erythema present. No pharyngeal swelling, oropharyngeal exudate or uvula swelling.  Eyes:     General: No scleral icterus.       Right eye: No discharge.        Left eye: No discharge.     Extraocular Movements: Extraocular movements intact.     Conjunctiva/sclera: Conjunctivae normal.     Pupils: Pupils are equal, round, and reactive to light.  Neck:     Vascular: No carotid bruit.     Comments: Right side goiter  Cardiovascular:     Rate and Rhythm: Normal rate and regular rhythm.     Pulses: Normal pulses.     Heart sounds: Normal heart sounds. No murmur heard.    No friction rub. No gallop.  Pulmonary:     Effort: Pulmonary effort is normal. No respiratory distress.     Breath sounds: Normal breath sounds. No wheezing, rhonchi or rales.  Chest:     Chest wall: No tenderness.  Abdominal:     General: Bowel sounds are normal. There is no distension.     Palpations: Abdomen is soft. There is no mass.     Tenderness: There is no abdominal tenderness. There is no right CVA tenderness, left CVA tenderness, guarding or rebound.  Musculoskeletal:        General: No swelling or tenderness. Normal range of motion.     Cervical back: Normal range of motion. No rigidity or tenderness.     Right lower leg: No edema.     Left lower leg: No edema.  Lymphadenopathy:      Cervical: No cervical adenopathy.  Skin:    General: Skin is warm and dry.     Coloration: Skin is not pale.     Findings: No bruising, erythema, lesion or rash.  Neurological:     Mental Status: She is alert and oriented to person, place, and time.     Motor: No weakness.     Gait: Gait normal.  Psychiatric:        Mood and Affect: Mood normal.        Speech: Speech normal.        Behavior: Behavior normal.     Labs reviewed: Recent Labs    07/25/21 1638  NA 137  K 4.7  CL 98  CO2 34*  GLUCOSE 111*  BUN 14  CREATININE 1.23*  CALCIUM 9.4   No results for input(s): "AST", "ALT", "ALKPHOS", "BILITOT", "PROT", "ALBUMIN" in the last 8760 hours. Recent Labs    08/10/21 1346  WBC 11.6*  NEUTROABS 9,988*  HGB 13.6  HCT 41.1  MCV 89.5  PLT 268   Lab Results  Component Value Date   TSH 6.42 (H) 07/25/2021   Lab Results  Component Value Date   HGBA1C 6.2 (H) 11/20/2021   Lab Results  Component Value Date   CHOL 125 11/13/2020   HDL 50 11/13/2020   LDLCALC 60 11/13/2020   TRIG 70 11/13/2020   CHOLHDL 2.5 11/13/2020    Significant Diagnostic Results in last 30 days:  No results found.  Assessment/Plan  Subacute maxillary sinusitis Afebrile  Bilateral frontal and maxillary sinus tender on percussion facial skin redness on C-pap margins. -  nasal drainage noted. - advised to take Zyrtec or Claritin daily x 7 days.Zyrtec samples given during visit QTY 10 - will initiate Doxycycline as below.side effects discussed. - encouraged to increase fluid intake and avoid sugary sodas/juices for now.  - doxycycline (VIBRA-TABS) 100 MG tablet; Take 1 tablet (100 mg total) by mouth 2 (two) times daily for 7 days.  Dispense: 14 tablet; Refill: 0 - POC COVID-19 test negative   Family/ staff Communication: Reviewed plan of care with patient verbalized understanding   Labs/tests ordered: - POC COVID-19   Next Appointment: Return if symptoms worsen or fail to  improve.   Sandrea Hughs, NP

## 2022-05-06 DIAGNOSIS — E0590 Thyrotoxicosis, unspecified without thyrotoxic crisis or storm: Secondary | ICD-10-CM | POA: Diagnosis not present

## 2022-05-06 DIAGNOSIS — E049 Nontoxic goiter, unspecified: Secondary | ICD-10-CM | POA: Diagnosis not present

## 2022-05-07 ENCOUNTER — Other Ambulatory Visit: Payer: Self-pay | Admitting: Cardiovascular Disease

## 2022-05-07 ENCOUNTER — Other Ambulatory Visit: Payer: Self-pay | Admitting: Family Medicine

## 2022-05-08 ENCOUNTER — Ambulatory Visit (INDEPENDENT_AMBULATORY_CARE_PROVIDER_SITE_OTHER): Payer: Medicare Other

## 2022-05-08 DIAGNOSIS — I495 Sick sinus syndrome: Secondary | ICD-10-CM | POA: Diagnosis not present

## 2022-05-08 LAB — CUP PACEART REMOTE DEVICE CHECK
Battery Remaining Longevity: 113 mo
Battery Remaining Percentage: 79 %
Battery Voltage: 3.01 V
Brady Statistic RV Percent Paced: 96 %
Date Time Interrogation Session: 20231206040012
Implantable Lead Connection Status: 753985
Implantable Lead Connection Status: 753985
Implantable Lead Connection Status: 753985
Implantable Lead Implant Date: 20120702
Implantable Lead Implant Date: 20120702
Implantable Lead Implant Date: 20210308
Implantable Lead Location: 753859
Implantable Lead Location: 753860
Implantable Lead Location: 753860
Implantable Pulse Generator Implant Date: 20210308
Lead Channel Impedance Value: 640 Ohm
Lead Channel Pacing Threshold Amplitude: 0.5 V
Lead Channel Pacing Threshold Pulse Width: 0.5 ms
Lead Channel Sensing Intrinsic Amplitude: 10.8 mV
Lead Channel Setting Pacing Amplitude: 0.75 V
Lead Channel Setting Pacing Pulse Width: 0.5 ms
Lead Channel Setting Sensing Sensitivity: 2 mV
Pulse Gen Model: 2272
Pulse Gen Serial Number: 3801383

## 2022-05-10 DIAGNOSIS — E049 Nontoxic goiter, unspecified: Secondary | ICD-10-CM | POA: Diagnosis not present

## 2022-05-10 DIAGNOSIS — E0590 Thyrotoxicosis, unspecified without thyrotoxic crisis or storm: Secondary | ICD-10-CM | POA: Diagnosis not present

## 2022-05-20 ENCOUNTER — Encounter: Payer: Self-pay | Admitting: Adult Health

## 2022-05-20 ENCOUNTER — Ambulatory Visit (INDEPENDENT_AMBULATORY_CARE_PROVIDER_SITE_OTHER): Payer: Medicare Other | Admitting: Adult Health

## 2022-05-20 VITALS — Ht 59.0 in | Wt 128.0 lb

## 2022-05-20 DIAGNOSIS — J0181 Other acute recurrent sinusitis: Secondary | ICD-10-CM | POA: Diagnosis not present

## 2022-05-20 MED ORDER — DOXYCYCLINE HYCLATE 100 MG PO TABS: 100.0000 mg | ORAL_TABLET | Freq: Two times a day (BID) | ORAL | 0 refills | Status: AC

## 2022-05-20 MED ORDER — BENZONATATE 100 MG PO CAPS: 100.0000 mg | ORAL_CAPSULE | Freq: Three times a day (TID) | ORAL | 0 refills | Status: DC

## 2022-05-20 NOTE — Progress Notes (Signed)
This service is provided via telemedicine  No vital signs collected/recorded due to the encounter was a telemedicine visit.   Location of patient (ex: home, work):  home  Patient consents to a telephone visit:  yes  Location of the provider (ex: office, home):  [facility]  Names of all persons participating in the telemedicine service and their role in the encounter:  patient, Hailey [name], Hailey Age, NP   Time spent on call:  2 minutes     DATE:  05/20/2022 MRN:  [id number]  BIRTHDAY: 11-21-1945   Contact Information     Name Relation Home Work [place] Daughter [phone]  [phone]   Haltom,Lisa Daughter   [phone]        Code Status History     Date Active Date Inactive Code Status Order ID Comments User Context   08/09/2019 1629 08/10/2019 1614 Full Code [id number]  Sanda Klein, MD Inpatient   04/19/2018 1725 04/25/2018 1802 Full Code [id number]  Vashti Hey, MD Inpatient   07/30/2017 1331 07/30/2017 1915 Full Code [id number]  Troy Sine, MD Inpatient   08/10/2013 1250 08/11/2013 1402 Full Code [id number]  Thompson Grayer, MD Inpatient   05/26/2013 1558 05/29/2013 1821 Full Code [id number]  Louellen Molder, MD Inpatient   12/23/2012 1215 12/24/2012 1807 Full Code [id number]  Hosie Spangle, MD Inpatient   07/31/2012 1811 08/01/2012 1227 Full Code [id number]  Thompson Grayer, MD Inpatient        Chief Complaint  Patient presents with   Acute Visit    URI symptoms    HISTORY OF PRESENT ILLNESS: This is a 76 year old female who had a telephone visit to for URI symptoms. He started coughing with whitish phlegm and nasal congestion 2 days ago. He denies having sore throat, chills nor fever. He uses CPAP at night due to sleep apnea. He had sinus infection on 04/09/22 and was treated with Doxycycline. He currently takes Eliquis for atrial fibrillation.   PAST MEDICAL HISTORY:  Past  Medical History:  Diagnosis Date   Acute on chronic diastolic CHF (congestive heart failure), NYHA class 1 (HCC) 04/29/2013   Allergic rhinitis    Atrial fibrillation (HCC)    Pacemaker   Brady-tachy syndrome (HCC)    s/p STJ dual chamber PPM by Dr Sallyanne Kuster   CAD (coronary artery disease)    70% RCA stenosis, treated medically   Control of atrial fibrillation with pacemaker ([place])    Diabetes mellitus ([place])    DJD (degenerative joint disease)    Dysrhythmia    Paroxysmal Atrial Fib.   Hypertension    Hyperthyroidism    Paroxysmal atrial fibrillation (HCC)    Polio    as a child   PONV (postoperative nausea and vomiting)    Presence of permanent cardiac pacemaker    Pulmonary disease      CURRENT MEDICATIONS: Reviewed  Patient's Medications  New Prescriptions   No medications on file  Previous Medications   APIXABAN (ELIQUIS) 5 MG TABS TABLET    Take 1 tablet (5 mg total) by mouth 2 (two) times daily.   CHOLECALCIFEROL (VITAMIN D3) 1000 UNITS CAPS    Take 1,000 Units by mouth daily.   CYANOCOBALAMIN (VITAMIN B12 PO)    Take 1 tablet by mouth daily as needed (for energy).   FUROSEMIDE (LASIX) 80 MG TABLET    Take 1 tablet by mouth once daily   GLUCOSE BLOOD (ONETOUCH ULTRA) TEST STRIP  USE DAILY TO CHECK BLOOD SUGAR E11.8   LANCETS MISC. (ONE TOUCH SURESOFT) MISC    Use to test  Blood sugar once daily DX: E11.8,   LOPERAMIDE (IMODIUM) 2 MG CAPSULE    Take 2 mg by mouth as needed for diarrhea or loose stools.    METHIMAZOLE (TAPAZOLE) 10 MG TABLET    TAKE 1 TABLET BY MOUTH ONCE DAILY WITH FOOD FOR THYROID   METOPROLOL TARTRATE (LOPRESSOR) 50 MG TABLET    TAKE 1 TABLET BY MOUTH ONCE DAILY IN THE MORNING THEN TAKE 1/2 (ONE-HALF) TABLET ONCE DAILY IN THE EVENING   ONDANSETRON (ZOFRAN) 4 MG TABLET    Take 1 tablet (4 mg total) by mouth every 8 (eight) hours as needed for nausea or vomiting.   OXYBUTYNIN (DITROPAN) 5 MG TABLET    Take 1 tablet by mouth once daily   PANTOPRAZOLE  (PROTONIX) 40 MG TABLET    Take 1 tablet by mouth once daily   POTASSIUM CHLORIDE SA (KLOR-CON M) 20 MEQ TABLET    Take 1 tablet by mouth once daily   SILDENAFIL (REVATIO) 20 MG TABLET    TAKE 1 TABLET BY MOUTH THREE TIMES DAILY   SITAGLIPTIN (JANUVIA) 100 MG TABLET    Take 1 tablet by mouth once daily  Modified Medications   No medications on file  Discontinued Medications   No medications on file     Allergies  Allergen Reactions   Codeine Nausea And Vomiting and Nausea Only   Ketorolac Nausea And Vomiting, Nausea Only and Other (See Comments)    "pt told to never take this med"   Meperidine Nausea Only   Demerol Nausea And Vomiting   Dronedarone Other (See Comments)    Increased fluid retention   Cyclobenzaprine Nausea And Vomiting     REVIEW OF SYSTEMS:  GENERAL: no change in appetite, no fatigue, no weight changes, no fever, chills or weakness SKIN: Denies rash, itching, wounds, ulcer sores, or nail abnormality EYES: Denies change in vision, dry eyes, eye pain, itching or discharge EARS: Denies change in hearing, ringing in ears, or earache NOSE: has left nasal congestion MOUTH and THROAT: Denies oral discomfort, gingival pain or bleeding, pain from teeth or hoarseness   RESPIRATORY: has productive cough, no SOB, DOE, wheezing, hemoptysis CARDIAC: no chest pain, edema or palpitations GI: no abdominal pain, diarrhea, constipation, heart burn, nausea or vomiting GU: Denies dysuria, frequency, hematuria, incontinence, or discharge NEUROLOGICAL: Denies dizziness, syncope, numbness, or headache PSYCHIATRIC: Denies feeling of depression or anxiety. No report of hallucinations, insomnia, paranoia, or agitation   LABS/RADIOLOGY: Labs reviewed: Basic Metabolic Panel: Recent Labs    07/25/21 1638  NA 137  K 4.7  CL 98  CO2 34*  GLUCOSE 111*  BUN 14  CREATININE 1.23*  CALCIUM 9.4   Liver Function Tests: No results for input(s): "AST", "ALT", "ALKPHOS", "BILITOT",  "PROT", "ALBUMIN" in the last 8760 hours. No results for input(s): "LIPASE", "AMYLASE" in the last 8760 hours. No results for input(s): "AMMONIA" in the last 8760 hours. CBC: Recent Labs    08/10/21 1346  WBC 11.6*  NEUTROABS 9,988*  HGB 13.6  HCT 41.1  MCV 89.5  PLT 268   A1C: Invalid input(s): "A1C" Lipid Panel: No results for input(s): "HDL" in the last 8760 hours.  Invalid input(s): "LDL", "TRIGLYCERIDE", "TOTALCHOL" Cardiac Enzymes: No results for input(s): "CKTOTAL", "CKMB", "CKMBINDEX", "TROPONINI" in the last 8760 hours. BNP: Invalid input(s): "POCBNP" CBG: No results for input(s): "GLUCAP" in the last  8760 hours.    CUP PACEART REMOTE DEVICE CHECK  Result Date: 05/08/2022 Scheduled remote reviewed. Normal device function.  Next remote 91 days. [place]   ASSESSMENT/PLAN:  1. Other acute recurrent sinusitis -  instructed top rest, eat healthy meals - doxycycline (VIBRA-TABS) 100 MG tablet; Take 1 tablet (100 mg total) by mouth 2 (two) times daily for 7 days.  Dispense: 14 tablet; Refill: 0 - benzonatate (TESSALON PERLES) 100 MG capsule; Take 1 capsule (100 mg total) by mouth 3 (three) times daily.  Dispense: 21 capsule; Refill: 0    Time spent on non face to face visit:  11 minutes  The patient gave consent to this telephone visit. Explained to the patient the risk and privacy issue that was involved with this telephone call.   The patient was advised to call back and ask for an in-person evaluation if the symptoms worsen or if the condition fails to improve.   Hailey Age, NP [facility] [phone]  .

## 2022-05-20 NOTE — Patient Instructions (Signed)

## 2022-05-28 ENCOUNTER — Ambulatory Visit (INDEPENDENT_AMBULATORY_CARE_PROVIDER_SITE_OTHER): Payer: Medicare Other | Admitting: Family

## 2022-05-28 ENCOUNTER — Encounter: Payer: Self-pay | Admitting: Family

## 2022-05-28 VITALS — BP 138/78 | HR 65 | Temp 97.2°F | Resp 16 | Ht 59.0 in | Wt 129.6 lb

## 2022-05-28 DIAGNOSIS — R051 Acute cough: Secondary | ICD-10-CM | POA: Diagnosis not present

## 2022-05-28 DIAGNOSIS — J0100 Acute maxillary sinusitis, unspecified: Secondary | ICD-10-CM

## 2022-05-28 DIAGNOSIS — J029 Acute pharyngitis, unspecified: Secondary | ICD-10-CM | POA: Diagnosis not present

## 2022-05-28 DIAGNOSIS — I2510 Atherosclerotic heart disease of native coronary artery without angina pectoris: Secondary | ICD-10-CM

## 2022-05-28 LAB — POCT INFLUENZA A/B
Influenza A, POC: NEGATIVE
Influenza B, POC: NEGATIVE

## 2022-05-28 LAB — POCT RAPID STREP A (OFFICE): Rapid Strep A Screen: NEGATIVE

## 2022-05-28 LAB — POC COVID19 BINAXNOW: SARS Coronavirus 2 Ag: NEGATIVE

## 2022-05-28 MED ORDER — AZITHROMYCIN 250 MG PO TABS: ORAL_TABLET | ORAL | 0 refills | Status: AC

## 2022-05-28 MED ORDER — GUAIFENESIN-DM 100-10 MG/5ML PO SYRP: 5.0000 mL | ORAL_SOLUTION | ORAL | 0 refills | Status: DC | PRN

## 2022-05-28 NOTE — Patient Instructions (Signed)
-   Nasal saline rinse at least once daily for nasal congestion  - Gurgle throat with warm water and salt at least once daily  - Take OTC tylenol as needed for pain  - Increase fluid intake/warm tea or soup  - Notify provider if symptoms worsen or fail to improve

## 2022-05-28 NOTE — Progress Notes (Signed)
Provider: Ilario Dhaliwal FNP-C  Wardell Honour, MD  Patient Care Team: Wardell Honour, MD as PCP - General (Family Medicine) Sanda Klein, MD as PCP - Cardiology (Cardiology) Collene Gobble, MD as Consulting Physician (Pulmonary Disease) Marica Otter, [place] (Optometry) Linda Hedges, DO as Consulting Physician (Obstetrics and Gynecology)  Extended Emergency Contact Information Primary Emergency Contact: Elsie Stain of [employer] Phone: [phone] Mobile Phone: [phone] Relation: Daughter Secondary Emergency Contact: Haltom,Lisa Address: [address]          [place], [place] [address] Johnnette Litter of [employer] Phone: [phone] Relation: Daughter  Code Status: Full Code  Goals of care: Advanced Directive information    05/28/2022    2:29 PM  Advanced Directives  Does Patient Have a Medical Advance Directive? Yes  Type of Advance Directive [place]  Does patient want to make changes to medical advance directive? No - Patient declined  Copy of [place] in Chart? No - copy requested     Chief Complaint  Patient presents with   Follow-up    Patient complains of sinus infection. Symptoms are runny nose, sore throat, and cough. Previously seen 04/09/2022, and 05/20/2022.    HPI:  Pt is a 76 y.o. female seen today for an acute visit for evaluation of sinus drainage,runny nose,cough,sore throat x 9 days.  Coughing on and off at night.lower back hurts from coughing. Has been lying on 4 pillows and a pad under which helps to sit up straight. Lying flat makes her cough too much.  She denies any fever,chills,fatigue,runny nose,chest tightness,chest pain,palpitation or shortness of breath. She was seen by Einar Pheasant -Vargus on 05/20/2022 was prescribed second round of  7 days course of Doxycycline and Tessalon Perles. She was treated with doxycycline 04/09/2022 with maxillary  sinusitis.  Past Medical History:  Diagnosis Date   Acute on chronic diastolic CHF (congestive heart failure), NYHA class 1 (HCC) 04/29/2013   Allergic rhinitis    Atrial fibrillation (HCC)    Pacemaker   Brady-tachy syndrome (HCC)    s/p STJ dual chamber PPM by Dr Sallyanne Kuster   CAD (coronary artery disease)    70% RCA stenosis, treated medically   Control of atrial fibrillation with pacemaker ([place])    Diabetes mellitus ([place])    DJD (degenerative joint disease)    Dysrhythmia    Paroxysmal Atrial Fib.   Hypertension    Hyperthyroidism    Paroxysmal atrial fibrillation (HCC)    Polio    as a child   PONV (postoperative nausea and vomiting)    Presence of permanent cardiac pacemaker    Pulmonary disease    Past Surgical History:  Procedure Laterality Date   arm surgery d/t fx Right 84   ATRIAL FIBRILLATION ABLATION  07/31/2012; 08-10-13   PVI by Dr Rayann Heman   ATRIAL FIBRILLATION ABLATION N/A 07/31/2012   Procedure: ATRIAL FIBRILLATION ABLATION;  Surgeon: Thompson Grayer, MD;  Location: [facility] CATH LAB;  Service: Cardiovascular;  Laterality: N/A;   ATRIAL FIBRILLATION ABLATION N/A 08/10/2013   Procedure: ATRIAL FIBRILLATION ABLATION;  Surgeon: Coralyn Mark, MD;  Location: [place] CATH LAB;  Service: Cardiovascular;  Laterality: N/A;   BACK SURGERY  07/26/2012   dR. [facility]   CARDIAC CATHETERIZATION  12/03/2010   single vessel mid RCA   CARDIAC ELECTROPHYSIOLOGY MAPPING AND ABLATION  07/31/2012   Dr. Thompson Grayer   CESAREAN SECTION  09/20/1974   Dr. Gertie Fey   CHOLECYSTECTOMY  1994  COLONOSCOPY WITH PROPOFOL N/A 03/21/2015   Procedure: COLONOSCOPY WITH PROPOFOL;  Surgeon: Juanita Craver, MD;  Location: [facility] ENDOSCOPY;  Service: Endoscopy;  Laterality: N/A;   DIAGNOSTIC MAMMOGRAM  04/16/2017   Hedwig Morton. Morris, DO   LAPAROSCOPIC HYSTERECTOMY  Late 27s to early 12s   LEFT HEART CATH AND CORONARY ANGIOGRAPHY N/A 07/30/2017   Procedure: LEFT HEART CATH AND CORONARY ANGIOGRAPHY;  Surgeon: Troy Sine, MD;  Location: [place] CV LAB;  Service: Cardiovascular;  Laterality: N/A;   LUMBAR LAMINECTOMY/DECOMPRESSION MICRODISCECTOMY Right 12/23/2012   Procedure: Right L5-S1 Laminotomy for resection of synovial cyst;  Surgeon: Hosie Spangle, MD;  Location: [place] NEURO ORS;  Service: Neurosurgery;  Laterality: Right;  Right Lumbar five-sacral one Laminotomy for resection of synovial cyst   NM MYOCAR PERF WALL MOTION  12/20/2010   normal   PACEMAKER IMPLANT N/A 08/09/2019   Procedure: PACEMAKER IMPLANT;  Surgeon: Sanda Klein, MD;  Location: [place] CV LAB;  Service: Cardiovascular;  Laterality: N/A;   PACEMAKER INSERTION  12/03/10   SJM Accent DR RF implanted by Dr Sallyanne Kuster   RIGHT HEART CATH N/A 04/22/2018   Procedure: RIGHT HEART CATH;  Surgeon: Wellington Hampshire, MD;  Location: [place] CV LAB;  Service: Cardiovascular;  Laterality: N/A;   TEE WITHOUT CARDIOVERSION N/A 07/30/2012   Procedure: TRANSESOPHAGEAL ECHOCARDIOGRAM (TEE);  Surgeon: Sanda Klein, MD;  Location: [facility] ENDOSCOPY;  Service: Cardiovascular;  Laterality: N/A;  h&p in file-[place]   TEE WITHOUT CARDIOVERSION N/A 08/09/2013   Procedure: TRANSESOPHAGEAL ECHOCARDIOGRAM (TEE);  Surgeon: Lelon Perla, MD;  Location: [facility] ENDOSCOPY;  Service: Cardiovascular;  Laterality: N/A;   TOTAL ABDOMINAL HYSTERECTOMY  1981   ULTRASOUND GUIDANCE FOR VASCULAR ACCESS  04/22/2018   Procedure: Ultrasound Guidance For Vascular Access;  Surgeon: Wellington Hampshire, MD;  Location: [place] CV LAB;  Service: Cardiovascular;;    Allergies  Allergen Reactions   Codeine Nausea And Vomiting and Nausea Only   Ketorolac Nausea And Vomiting, Nausea Only and Other (See Comments)    "pt told to never take this med"   Meperidine Nausea Only   Demerol Nausea And Vomiting   Dronedarone Other (See Comments)    Increased fluid retention   Cyclobenzaprine Nausea And Vomiting    Outpatient Encounter Medications as of 05/28/2022  Medication Sig    apixaban (ELIQUIS) 5 MG TABS tablet Take 1 tablet (5 mg total) by mouth 2 (two) times daily.   benzonatate (TESSALON PERLES) 100 MG capsule Take 1 capsule (100 mg total) by mouth 3 (three) times daily.   Cholecalciferol (VITAMIN D3) 1000 UNITS CAPS Take 1,000 Units by mouth daily.   Cyanocobalamin (VITAMIN B12 PO) Take 1 tablet by mouth daily as needed (for energy).   furosemide (LASIX) 80 MG tablet Take 1 tablet by mouth once daily   glucose blood (ONETOUCH ULTRA) test strip USE DAILY TO CHECK BLOOD SUGAR E11.8   Lancets Misc. (ONE TOUCH SURESOFT) MISC Use to test  Blood sugar once daily DX: E11.8,   loperamide (IMODIUM) 2 MG capsule Take 2 mg by mouth as needed for diarrhea or loose stools.    methimazole (TAPAZOLE) 10 MG tablet TAKE 1 TABLET BY MOUTH ONCE DAILY WITH FOOD FOR THYROID   metoprolol tartrate (LOPRESSOR) 50 MG tablet TAKE 1 TABLET BY MOUTH ONCE DAILY IN THE MORNING THEN TAKE 1/2 (ONE-HALF) TABLET ONCE DAILY IN THE EVENING   ondansetron (ZOFRAN) 4 MG tablet Take 1 tablet (4 mg total) by mouth every 8 (eight)  hours as needed for nausea or vomiting.   oxybutynin (DITROPAN) 5 MG tablet Take 1 tablet by mouth once daily   pantoprazole (PROTONIX) 40 MG tablet Take 1 tablet by mouth once daily   potassium chloride SA (KLOR-CON M) 20 MEQ tablet Take 1 tablet by mouth once daily   sildenafil (REVATIO) 20 MG tablet TAKE 1 TABLET BY MOUTH THREE TIMES DAILY   sitaGLIPtin (JANUVIA) 100 MG tablet Take 1 tablet by mouth once daily   No facility-administered encounter medications on file as of 05/28/2022.    Review of Systems  Constitutional:  Negative for appetite change, chills, fatigue, fever and unexpected weight change.  HENT:  Positive for congestion, rhinorrhea, sinus pressure, sinus pain and sore throat. Negative for dental problem, ear discharge, ear pain, facial swelling, hearing loss, nosebleeds, postnasal drip, sneezing, tinnitus and trouble swallowing.   Eyes:  Negative for pain,  discharge, redness, itching and visual disturbance.  Respiratory:  Positive for cough. Negative for chest tightness, shortness of breath and wheezing.   Cardiovascular:  Negative for chest pain, palpitations and leg swelling.  Gastrointestinal:  Negative for abdominal distention, abdominal pain, blood in stool, constipation, diarrhea, nausea and vomiting.  Neurological:  Negative for dizziness, weakness, light-headedness and headaches.    Immunization History  Administered Date(s) Administered   Fluad Quad(high Dose 65+) 01/21/2019, 03/27/2020, 05/22/2021, 03/06/2022   Influenza Whole 02/19/2013   Influenza, High Dose Seasonal PF 02/09/2018   PFIZER(Purple Top)SARS-COV-2 Vaccination 07/08/2019, 07/29/2019   Pfizer Covid-19 Vaccine Bivalent Booster 69yr & up 05/12/2020, 05/04/2021   Pneumococcal Polysaccharide-23 02/09/2018   Pneumococcal-Unspecified 04/29/2011   Tdap 03/05/2013   Pertinent  Health Maintenance Due  Topic Date Due   OPHTHALMOLOGY EXAM  Never done   FOOT EXAM  11/15/2021   HEMOGLOBIN A1C  05/22/2022   INFLUENZA VACCINE  Completed   DEXA SCAN  Completed   COLONOSCOPY (Pts 45-447yrInsurance coverage will need to be confirmed)  Discontinued      05/22/2021    1:33 PM 08/10/2021   11:23 AM 09/25/2021    1:18 PM 11/20/2021    2:34 PM 05/28/2022    2:29 PM  Fall Risk  Falls in the past year? 0 0 0 0 0  Was there an injury with Fall? 0 0 0 0 0  Fall Risk Category Calculator 0 0 0 0 0  Fall Risk Category Low Low Low Low Low  Patient Fall Risk Level Low fall risk Low fall risk Low fall risk Low fall risk Low fall risk  Patient at Risk for Falls Due to No Fall Risks No Fall Risks No Fall Risks No Fall Risks No Fall Risks  Fall risk Follow up Falls evaluation completed;Education provided;Falls prevention discussed Falls evaluation completed Falls evaluation completed Falls evaluation completed Falls evaluation completed   Functional Status Survey:    Vitals:    05/28/22 1427  BP: 138/78  Pulse: 65  Resp: 16  Temp: (!) 97.2 F (36.2 C)  SpO2: 97%  Weight: 129 lb 9.6 oz (58.8 kg)  Height: '4\' 11"'$  (1.499 m)   Body mass index is 26.18 kg/m. Physical Exam Vitals reviewed.  Constitutional:      General: She is not in acute distress.    Appearance: Normal appearance. She is overweight. She is not ill-appearing or diaphoretic.  HENT:     Head: Normocephalic.     Right Ear: Tympanic membrane, ear canal and external ear normal. There is no impacted cerumen.     Left Ear:  Tympanic membrane, ear canal and external ear normal. There is no impacted cerumen.     Nose: No congestion or rhinorrhea.     Right Turbinates: Not enlarged, swollen or pale.     Left Turbinates: Not enlarged, swollen or pale.     Right Sinus: Maxillary sinus tenderness present. No frontal sinus tenderness.     Left Sinus: Maxillary sinus tenderness present. No frontal sinus tenderness.     Mouth/Throat:     Mouth: Mucous membranes are moist.     Pharynx: Oropharynx is clear. Posterior oropharyngeal erythema present. No oropharyngeal exudate.  Eyes:     General: No scleral icterus.       Right eye: No discharge.        Left eye: No discharge.     Extraocular Movements: Extraocular movements intact.     Conjunctiva/sclera: Conjunctivae normal.     Pupils: Pupils are equal, round, and reactive to light.  Neck:     Vascular: No carotid bruit.  Cardiovascular:     Rate and Rhythm: Normal rate and regular rhythm.     Pulses: Normal pulses.     Heart sounds: Normal heart sounds. No murmur heard.    No friction rub. No gallop.  Pulmonary:     Effort: Pulmonary effort is normal. No respiratory distress.     Breath sounds: Normal breath sounds. No wheezing, rhonchi or rales.  Chest:     Chest wall: No tenderness.  Abdominal:     General: Bowel sounds are normal. There is no distension.     Palpations: Abdomen is soft. There is no mass.     Tenderness: There is no abdominal  tenderness. There is no right CVA tenderness, left CVA tenderness, guarding or rebound.  Musculoskeletal:        General: No swelling or tenderness. Normal range of motion.     Cervical back: Normal range of motion. No rigidity or tenderness.     Right lower leg: No edema.     Left lower leg: No edema.  Lymphadenopathy:     Cervical: No cervical adenopathy.  Skin:    General: Skin is warm and dry.     Coloration: Skin is not pale.     Findings: No erythema or rash.  Neurological:     Mental Status: She is alert and oriented to person, place, and time.     Motor: No weakness.     Gait: Gait abnormal.  Psychiatric:        Mood and Affect: Mood normal.        Speech: Speech normal.        Behavior: Behavior normal.    Labs reviewed: Recent Labs    07/25/21 1638  NA 137  K 4.7  CL 98  CO2 34*  GLUCOSE 111*  BUN 14  CREATININE 1.23*  CALCIUM 9.4   No results for input(s): "AST", "ALT", "ALKPHOS", "BILITOT", "PROT", "ALBUMIN" in the last 8760 hours. Recent Labs    08/10/21 1346  WBC 11.6*  NEUTROABS 9,988*  HGB 13.6  HCT 41.1  MCV 89.5  PLT 268   Lab Results  Component Value Date   TSH 6.42 (H) 07/25/2021   Lab Results  Component Value Date   HGBA1C 6.2 (H) 11/20/2021   Lab Results  Component Value Date   CHOL 125 11/13/2020   HDL 50 11/13/2020   LDLCALC 60 11/13/2020   TRIG 70 11/13/2020   CHOLHDL 2.5 11/13/2020    Significant Diagnostic Results  in last 30 days:  CUP PACEART REMOTE DEVICE CHECK  Result Date: 05/08/2022 Scheduled remote reviewed. Normal device function.  Next remote 91 days. LA   Assessment/Plan  1. Acute cough Afebrile  Bilateral lungs clear on exam  - advised to take Robitussin DM as below. - guaiFENesin-dextromethorphan (ROBITUSSIN DM) 100-10 MG/5ML syrup; Take 5 mLs by mouth every 4 (four) hours as needed for cough.  Dispense: 118 mL; Refill: 0 - POC COVID-19 negative  - POC Influenza A/B negative - POC Rapid Strep A  negative  2. Sore throat Posterior pharynx erythema without any exudate noted. - Advised to gurgle throat with warm water and salt at least once daily  - take OTC tylenol as needed for pain  - Encouraged to increase fluid intake/warm tea or soup  - Notify provider if symptoms worsen or fail to improve - start on Z-pak as below  - azithromycin (ZITHROMAX) 250 MG tablet; Take 2 tablets on day 1, then 1 tablet daily on days 2 through 5  Dispense: 6 tablet; Refill: 0 - POC COVID-19 results negative  - POC Influenza A/B results negative  - POC Rapid Strep A results negative   3. Subacute maxillary sinusitis Bilateral maxillary tenderness on palpation  Start on Z-pack as below.side effects discussed - azithromycin (ZITHROMAX) 250 MG tablet; Take 2 tablets on day 1, then 1 tablet daily on days 2 through 5  Dispense: 6 tablet; Refill: 0  Family/ staff Communication: Reviewed plan of care with patient,spouse and son verbalized understanding.   Labs/tests ordered:  - POC COVID-19  - POC Influenza A/B  - POC Rapid Strep A   Next Appointment: Return if symptoms worsen or fail to improve.  Sandrea Hughs, NP

## 2022-05-31 NOTE — Progress Notes (Signed)
Remote pacemaker transmission.   

## 2022-06-06 ENCOUNTER — Ambulatory Visit: Payer: Medicare Other | Attending: Cardiovascular Disease | Admitting: Cardiovascular Disease

## 2022-06-06 ENCOUNTER — Encounter: Payer: Self-pay | Admitting: Cardiovascular Disease

## 2022-06-06 VITALS — BP 112/60 | HR 60 | Ht 59.0 in | Wt 127.6 lb

## 2022-06-06 DIAGNOSIS — Z79899 Other long term (current) drug therapy: Secondary | ICD-10-CM | POA: Diagnosis not present

## 2022-06-06 DIAGNOSIS — Z950 Presence of cardiac pacemaker: Secondary | ICD-10-CM

## 2022-06-06 DIAGNOSIS — D6869 Other thrombophilia: Secondary | ICD-10-CM

## 2022-06-06 DIAGNOSIS — I2510 Atherosclerotic heart disease of native coronary artery without angina pectoris: Secondary | ICD-10-CM | POA: Diagnosis not present

## 2022-06-06 DIAGNOSIS — E118 Type 2 diabetes mellitus with unspecified complications: Secondary | ICD-10-CM

## 2022-06-06 DIAGNOSIS — I495 Sick sinus syndrome: Secondary | ICD-10-CM | POA: Diagnosis not present

## 2022-06-06 DIAGNOSIS — R0602 Shortness of breath: Secondary | ICD-10-CM

## 2022-06-06 DIAGNOSIS — I5032 Chronic diastolic (congestive) heart failure: Secondary | ICD-10-CM

## 2022-06-06 DIAGNOSIS — E049 Nontoxic goiter, unspecified: Secondary | ICD-10-CM | POA: Diagnosis not present

## 2022-06-06 DIAGNOSIS — I4811 Longstanding persistent atrial fibrillation: Secondary | ICD-10-CM | POA: Diagnosis not present

## 2022-06-06 DIAGNOSIS — I2721 Secondary pulmonary arterial hypertension: Secondary | ICD-10-CM

## 2022-06-06 DIAGNOSIS — I441 Atrioventricular block, second degree: Secondary | ICD-10-CM | POA: Diagnosis not present

## 2022-06-06 MED ORDER — FUROSEMIDE 80 MG PO TABS: 120.0000 mg | ORAL_TABLET | Freq: Every day | ORAL | 1 refills | Status: DC

## 2022-06-06 MED ORDER — POTASSIUM CHLORIDE CRYS ER 20 MEQ PO TBCR: 20.0000 meq | EXTENDED_RELEASE_TABLET | Freq: Two times a day (BID) | ORAL | 1 refills | Status: DC

## 2022-06-06 NOTE — Progress Notes (Signed)
Patient ID: Hailey [name], female   DOB: 25-Mar-1946, 77 y.o.   MRN: [id number]    Cardiology Office Note    Date:  06/07/2022   ID:  Gauri, Galvao 1946-04-19, MRN [id number]  PCP:  Wardell Honour, MD  Cardiologist:  Thompson Grayer, MD;  Sanda Klein, MD   No chief complaint on file.  Atrial fibrillation  History of Present Illness:  Hailey [name] is a 77 y.o. female with longstanding persistent atrial fibrillation (s/p RF ablation procedures, now with longstanding persistent atrial fibrillation for over a year), s/p dual-chamber permanent pacemaker ([employer] Accent 2012 for tachycardia-bradycardia syndrome and 2:1 atrioventricular block, generator change and new right ventricular lead for high pacing thresholds March 2021), chronic diastolic heart failure (history of acute decompensation on Multaq), multifactorial pulmonary artery hypertension, CAD (nonobstructive by cath February 2019), type 2 diabetes mellitus, hypertension, good lipid profile without therapy, hyperthyroidism (on propylthiouracil).  She has a history of GI side effects with Xarelto.  She reports that she is doing well, but her son points out that she gets easily short of breath.  Crossing a parking lot will make her breathe hard.  She sleeps on 3 pillows.  Recently she's added a fourth pillow.  Does not have any lower extremity edema.  She has been in persistent atrial fibrillation probably for 2 years now.  She has slow ventricular response due to previously known second-degree AV block (underlying rhythm around 45 bpm today and makes her feel poorly) and 96% ventricular paced rhythm.  Lead parameters are normal.  Estimated generator longevity is around 9-10 years.  She does not have episodes of high ventricular rates.  She is to undergo at least a partial thyroidectomy for large goiter involving the right thyroid lobe, possibly a total thyroidectomy (she was told that the surgeon may need to "come back the next day"  to do the other side).  Her surgeon is Dr. Inda Castle from [facility].  Her endocrinologist is Dr. Denton Lank.  She has not had an echocardiogram since July 2020 when she had normal LVEF with evidence of significant diastolic dysfunction and mild pulmonary hypertension.  echocardiogram July 2020 showed normal left ventricular systolic function and EF of 60-65%, pseudonormal mitral inflow, moderately enlarged right ventricle, biatrial mild dilation, estimated systolic PA pressure 40 mmHg.  Previous right heart catheterization showed that her pulmonary hypertension could not be explained exclusively based on elevated left heart filling pressures.  She was evaluated in the rheumatology clinic by Dr. Amil Amen since she had an elevated ANA of 1: 640, but all her other serological markers were negative for lupus, rheumatoid arthritis or other connective tissue diseases.  Chest CT has shown numerous bilateral pulmonary nodules felt to be nonspecific.  These have been present back as far as 2012.  She has a large goiter and is on chronic antithyroid medications.  Past Medical History:  Diagnosis Date   Acute on chronic diastolic CHF (congestive heart failure), NYHA class 1 (HCC) 04/29/2013   Allergic rhinitis    Atrial fibrillation (HCC)    Pacemaker   Brady-tachy syndrome (HCC)    s/p STJ dual chamber PPM by Dr Sallyanne Kuster   CAD (coronary artery disease)    70% RCA stenosis, treated medically   Control of atrial fibrillation with pacemaker ([place])    Diabetes mellitus ([place])    DJD (degenerative joint disease)    Dysrhythmia    Paroxysmal Atrial Fib.   Hypertension    Hyperthyroidism  Paroxysmal atrial fibrillation (HCC)    Polio    as a child   PONV (postoperative nausea and vomiting)    Presence of permanent cardiac pacemaker    Pulmonary disease     Past Surgical History:  Procedure Laterality Date   arm surgery d/t fx Right 96   ATRIAL FIBRILLATION ABLATION  07/31/2012; 08-10-13    PVI by Dr Rayann Heman   ATRIAL FIBRILLATION ABLATION N/A 07/31/2012   Procedure: ATRIAL FIBRILLATION ABLATION;  Surgeon: Thompson Grayer, MD;  [facility] CATH LAB;  Service: Cardiovascular;  Laterality: N/A;   ATRIAL FIBRILLATION ABLATION N/A 08/10/2013   Procedure: ATRIAL FIBRILLATION ABLATION;  Surgeon: Coralyn Mark, MD;  Location: [facility] CATH LAB;  Service: Cardiovascular;  Laterality: N/A;   BACK SURGERY  07/26/2012   [facility]R. [facility]   CARDIAC CATHETERIZATION  12/03/2010   single vessel mid RCA   CARDIAC ELECTROPHYSIOLOGY MAPPING AND ABLATION  07/31/2012   Dr. Thompson Grayer   CESAREAN SECTION  09/20/1974   Dr. Gertie Fey   CHOLECYSTECTOMY  1994   COLONOSCOPY WITH PROPOFOL N/A 03/21/2015   Procedure: COLONOSCOPY WITH PROPOFOL;  Surgeon: Juanita Craver, MD;  Location: [facility] ENDOSCOPY;  Service: Endoscopy;  Laterality: N/A;   DIAGNOSTIC MAMMOGRAM  04/16/2017   Hedwig Morton. Morris, DO   LAPAROSCOPIC HYSTERECTOMY  Late 83s to early 60s   LEFT HEART CATH AND CORONARY ANGIOGRAPHY N/A 07/30/2017   Procedure: LEFT HEART CATH AND CORONARY ANGIOGRAPHY;  Surgeon: Troy Sine, MD;  Location: [place] CV LAB;  Service: Cardiovascular;  Laterality: N/A;   LUMBAR LAMINECTOMY/DECOMPRESSION MICRODISCECTOMY Right 12/23/2012   Procedure: Right L5-S1 Laminotomy for resection of synovial cyst;  Surgeon: Hosie Spangle, MD;  Location: [place] NEURO ORS;  Service: Neurosurgery;  Laterality: Right;  Right Lumbar five-sacral one Laminotomy for resection of synovial cyst   NM MYOCAR PERF WALL MOTION  12/20/2010   normal   PACEMAKER IMPLANT N/A 08/09/2019   Procedure: PACEMAKER IMPLANT;  Surgeon: Sanda Klein, MD;  Location: [place] CV LAB;  Service: Cardiovascular;  Laterality: N/A;   PACEMAKER INSERTION  12/03/10   SJM Accent DR RF implanted by Dr Sallyanne Kuster   RIGHT HEART CATH N/A 04/22/2018   Procedure: RIGHT HEART CATH;  Surgeon: Wellington Hampshire, MD;  Location: [place] CV LAB;  Service: Cardiovascular;  Laterality: N/A;   TEE  WITHOUT CARDIOVERSION N/A 07/30/2012   Procedure: TRANSESOPHAGEAL ECHOCARDIOGRAM (TEE);  Surgeon: Sanda Klein, MD;  Location: [facility] ENDOSCOPY;  Service: Cardiovascular;  Laterality: N/A;  h&p in file-hope   TEE WITHOUT CARDIOVERSION N/A 08/09/2013   Procedure: TRANSESOPHAGEAL ECHOCARDIOGRAM (TEE);  Surgeon: Lelon Perla, MD;  Location[facility] ENDOSCOPY;  Service: Cardiovascular;  Laterality: N/A;   TOTAL ABDOMINAL HYSTERECTOMY  1981   ULTRASOUND GUIDANCE FOR VASCULAR ACCESS  04/22/2018   Procedure: Ultrasound Guidance For Vascular Access;  Surgeon: Wellington Hampshire, MD;  Location: [place] CV LAB;  Service: Cardiovascular;;    Current Medications: Outpatient Medications Prior to Visit  Medication Sig Dispense Refill   apixaban (ELIQUIS) 5 MG TABS tablet Take 1 tablet (5 mg total) by mouth 2 (two) times daily. 60 tablet 11   benzonatate (TESSALON PERLES) 100 MG capsule Take 1 capsule (100 mg total) by mouth 3 (three) times daily. 21 capsule 0   Cholecalciferol (VITAMIN D3) 1000 UNITS CAPS Take 1,000 Units by mouth daily.     glucose blood (ONETOUCH ULTRA) test strip USE DAILY TO CHECK BLOOD SUGAR E11.8 100 each 11   Lancets Misc. (ONE TOUCH  SURESOFT) MISC Use to test  Blood sugar once daily DX: E11.8, 1 each 0   loperamide (IMODIUM) 2 MG capsule Take 2 mg by mouth as needed for diarrhea or loose stools.      methimazole (TAPAZOLE) 10 MG tablet TAKE 1 TABLET BY MOUTH ONCE DAILY WITH FOOD FOR THYROID 90 tablet 1   metoprolol tartrate (LOPRESSOR) 50 MG tablet TAKE 1 TABLET BY MOUTH ONCE DAILY IN THE MORNING THEN TAKE 1/2 (ONE-HALF) TABLET ONCE DAILY IN THE EVENING 135 tablet 2   oxybutynin (DITROPAN) 5 MG tablet Take 1 tablet by mouth once daily 90 tablet 0   pantoprazole (PROTONIX) 40 MG tablet Take 1 tablet by mouth once daily 90 tablet 1   sildenafil (REVATIO) 20 MG tablet TAKE 1 TABLET BY MOUTH THREE TIMES DAILY 90 tablet 6   sitaGLIPtin (JANUVIA) 100 MG tablet Take 1 tablet by mouth once  daily 90 tablet 1   furosemide (LASIX) 80 MG tablet Take 1 tablet by mouth once daily 90 tablet 1   potassium chloride SA (KLOR-CON M) 20 MEQ tablet Take 1 tablet by mouth once daily 90 tablet 0   Cyanocobalamin (VITAMIN B12 PO) Take 1 tablet by mouth daily as needed (for energy). (Patient not taking: Reported on 06/06/2022)     guaiFENesin-dextromethorphan (ROBITUSSIN DM) 100-10 MG/5ML syrup Take 5 mLs by mouth every 4 (four) hours as needed for cough. (Patient not taking: Reported on 06/06/2022) 118 mL 0   ondansetron (ZOFRAN) 4 MG tablet Take 1 tablet (4 mg total) by mouth every 8 (eight) hours as needed for nausea or vomiting. (Patient not taking: Reported on 06/06/2022) 20 tablet 0   No facility-administered medications prior to visit.     Allergies:   Codeine, Ketorolac, Meperidine, Demerol, Dronedarone, and Cyclobenzaprine   Social History   Socioeconomic History   Marital status: Married    Spouse name: [name]   Number of children: 3   Years of education: 13   Highest education level: Some college, no degree  Occupational History   Occupation: housewife  Tobacco Use   Smoking status: Never   Smokeless tobacco: Never  Vaping Use   Vaping Use: Never used  Substance and Sexual Activity   Alcohol use: No   Drug use: No   Sexual activity: Not Currently    Birth control/protection: Abstinence  Other Topics Concern   Not on file  Social History Narrative   Lives in [place].  Retired.  Was office clerk in several companies.  Did some technical school after completing high school.  She has no pets.  She and her husband live in a one-story home.  They've been married since 1965.  She follows a heart healthy diet.  She has a living will and hcpoa but does not have a DNR form.  She does struggle to prepare food.     Social Determinants of Health   Financial Resource Strain: Not on file  Food Insecurity: Not on file  Transportation Needs: Not on file  Physical Activity: Not on file   Stress: Not on file  Social Connections: Not on file     Family History:  The patient's family history includes Breast cancer in her sister, sister, and sister; Cancer in her brother; Colon cancer in her sister; Dementia in her brother and sister; Diabetes in her sister; Heart attack in her sister; Heart disease in her sister and sister; Prostate cancer in her brother; Suicidality in her brother.   ROS:  Please see the history of present illness.    All other systems are reviewed and are negative.   PHYSICAL EXAM:   VS:  BP 112/60 (BP Location: Left Arm, Patient Position: Sitting, Cuff Size: Normal)   Pulse 60   Ht '4\' 11"'$  (1.499 m)   Wt 127 lb 9.6 oz (57.9 kg)   SpO2 98%   BMI 25.77 kg/m      General: Alert, oriented x3, no distress, ill to the subclavian pacemaker site Head: no evidence of trauma, PERRL, EOMI, no exophtalmos or lid lag, no myxedema, no xanthelasma; normal ears, nose and oropharynx Neck: Large right-sided goiter, normal jugular venous pulsations and no hepatojugular reflux; brisk carotid pulses without delay and no carotid bruits Chest: clear to auscultation, no signs of consolidation by percussion or palpation, normal fremitus, symmetrical and full respiratory excursions Cardiovascular: normal position and quality of the apical impulse, regular rhythm, normal first and paradoxically split second heart sounds, no murmurs, rubs or gallops Abdomen: no tenderness or distention, no masses by palpation, no abnormal pulsatility or arterial bruits, normal bowel sounds, no hepatosplenomegaly Extremities: no clubbing, cyanosis or edema; 2+ radial, ulnar and brachial pulses bilaterally; 2+ right femoral, posterior tibial and dorsalis pedis pulses; 2+ left femoral, posterior tibial and dorsalis pedis pulses; no subclavian or femoral bruits Neurological: grossly nonfocal Psych: Normal mood and affect    Wt Readings from Last 3 Encounters:  06/06/22 127 lb 9.6 oz (57.9 kg)   05/28/22 129 lb 9.6 oz (58.8 kg)  05/20/22 128 lb (58.1 kg)      Studies/Labs Reviewed:   Right heart catheterization April 22, 2018 1.  Mildly elevated left-sided filling pressures. 2.  Moderate to severe pulmonary hypertension with normal cardiac output.   Pulmonary capillary wedge pressure: 19 mmHg. PA pressure: 68/28 with a mean of 43 mmHg. Pulmonary vascular resistance: 4.9 Woods units Cardiac index was 2.97.   Recommendations: The patient seems to have mixed pulmonary hypertension due to left-sided heart failure and primary pulmonary hypertension.  She is mildly volume overloaded and I suspect that she can switch to oral furosemide tomorrow. Consider vasodilator therapy for pulmonary hypertension.   Left heart catheterization July 30, 2017   Ost 1st Diag to 1st Diag lesion is 10% stenosed. Mid RCA lesion is 20% stenosed. The left ventricular systolic function is normal. LV end diastolic pressure is normal.   No significant coronary obstructive disease with mild smooth 10% narrowing in the proximal to midportion of the first diagonal branch of the LAD; normal left circumflex coronary artery; and smooth 20% mid RCA narrowing.   Normal LV function without focal segmental wall motion abnormalities.  LVEDP 7.   RECOMMENDATION: Medical therapy.   Echo April 20, 2018     - Left ventricle: The cavity size was normal. Systolic function was   normal. The estimated ejection fraction was in the range of 55%   to 60%. Wall motion was normal; there were no regional wall   motion abnormalities. There was a reduced contribution of atrial   contraction to ventricular filling, due to increased ventricular   diastolic pressure or atrial contractile dysfunction. Features   are consistent with a pseudonormal left ventricular filling   pattern, with concomitant abnormal relaxation and increased   filling pressure (grade 2 diastolic dysfunction). - Left atrium: The atrium was  mildly dilated. - Right ventricle: The cavity size was mildly dilated. Wall   thickness was normal. - Right atrium: The atrium was mildly dilated. - Tricuspid  valve: There was mild-moderate regurgitation directed   centrally. - Pulmonary arteries: Systolic pressure was moderately to severely   increased. PA peak pressure: 66 mm Hg (S).   Impressions:   - PA pressure has increased since the previous study.       EKG:  EKG is ordered today personally reviewed shows atrial fibrillation with 100% ventricular pacing.  Lipid Panel    Component Value Date/Time   CHOL 125 11/13/2020 0955   TRIG 70 11/13/2020 0955   HDL 50 11/13/2020 0955   CHOLHDL 2.5 11/13/2020 0955   VLDL 10 11/24/2010 0837   LDLCALC 60 11/13/2020 0955   BMET    Component Value Date/Time   NA 140 06/06/2022 1027   K 4.3 06/06/2022 1027   CL 98 06/06/2022 1027   CO2 34 (H) 07/25/2021 1638   GLUCOSE 111 (H) 07/25/2021 1638   BUN 14 07/25/2021 1638   BUN 20 08/05/2019 1416   CREATININE 1.23 (H) 07/25/2021 1638   CREATININE 1.22 (H) 11/13/2020 0955   CALCIUM 9.4 07/25/2021 1638   GFRNONAA 43 (L) 11/13/2020 0955   GFRAA 50 (L) 11/13/2020 0955   Hemoglobin A1c 6.2% on 11/13/2020 ASSESSMENT:    1. Chronic diastolic heart failure ([place])   2. Medication management   3. PAH (pulmonary artery hypertension) ([place])   4. Longstanding persistent atrial fibrillation ([facility])   5. Acquired thrombophilia ([place])   6. Second degree AV block   7. SSS (sick sinus syndrome) ([place])   8. Pacemaker   9. Coronary artery disease involving native coronary artery of native heart without angina pectoris   10. Controlled type 2 diabetes mellitus with complication, without long-term current use of insulin ([place])   11. Thyroid goiter   12. Shortness of breath        PLAN:  In order of problems listed above:   CHF: Need to reevaluate left ventricular ejection fraction.  She has symptoms of left heart failure, but does not have any  clinical evidence of hypervolemia on physical exam and her weight is unchanged.  Seems to be describing orthopnea as well as NYHA functional class II-3 exertional dyspnea.  Increase furosemide to 120 mg daily.  She's always had a high burden of ventricular pacing and despite this had preserved LV EF, so it is hard to implicate ventricular pacing as a cause of heart failure.  If ejection fraction has decreased, would benefit from CRT-P upgrade.  May benefit from SGLT2 inhibitors.  Will check BNP today as well.   PAH: Reevaluate by echo.  Previously demonstrated to be multifactorial, partly due to diastolic left heart failure, but with markedly increased transpulmonary gradient of 24 mmHg, consistent with a component of intrinsic arterial or lung disease.  The exact mechanism is uncertain although she has a markedly positive ANA without other findings to suggest systemic autoimmune disease.  Component of WHO group 1 PAH.  No evidence of progressive interstitial lung disease by imaging studies.  Has had a good clinical and echocardiographic response to chronic treatment with sildenafil. AFib: Despite previous ablation and multiple antiarrhythmics she has 2 permanent atrial fibrillation.  AV node disease prevents high ventricular rates and she has virtually 100% paced rhythm.  She has a remarkably narrow paced QRS complex and actually feels better with ventricular pacing compared to when we allow native AV conduction during threshold testing or when we check underlying rhythm.  CHADSVasc at least 5 (age, gender, DM, CAD, history of CHF). Anticoagulation: No bleeding complications with Eliquis.  Has a history of GI intolerance to Xarelto. 2nd deg AVB: 95% ventricular paced rhythm.  She feels best when she has ventricular pacing.  She felt extremely poorly when she lost ventricular capture in the past and she feels unwell when we check underlying rhythm or perform ventricular pacing threshold checks.  SSS: Precedes  diagnosis of AV block and persistent atrial fibrillation and was the initial reason for device implantation. PPM: Received a new generator and a new right ventricular lead in 2020.  Normal device check.  Continue modalities every 3 months and yearly office visits or more frequently based on clinical status, CAD: Denies angina.  Had nonobstructive disease at most recent cath in February 2019.  Recent lipid profile shows LDL cholesterol 60. DM: Good glycemic control. Goiter: Planning thyroid surgery in a month or 2.  In the meantime we need to make sure that her heart failure is well compensated.  Medication Adjustments/Labs and Tests Ordered: Current medicines are reviewed at length with the patient today.  Concerns regarding medicines are outlined above.  Medication changes, Labs and Tests ordered today are listed in the Patient Instructions below.   Patient Instructions  Medication Instructions:  INCREASE furosemide (lasix) to '120mg'$  daily -- this is one and one-half of the '80mg'$  tablets INCREASE potassium to 43mq twice daily   *If you need a refill on your cardiac medications before your next appointment, please call your pharmacy*   Lab Work: BMET and BNP today  If you have labs (blood work) drawn today and your tests are completely normal, you will receive your results only by: [place](if you have MyChart) OR A paper copy in the mail If you have any lab test that is abnormal or we need to change your treatment, we will call you to review the results.   Testing/Procedures: Your physician has requested that you have an echocardiogram. Echocardiography is a painless test that uses sound waves to create images of your heart. It provides your doctor with information about the size and shape of your heart and how well your heart's chambers and valves are working. This procedure takes approximately one hour. There are no restrictions for this procedure. Please do NOT wear cologne,  perfume, aftershave, or lotions (deodorant is allowed). Please arrive 15 minutes prior to your appointment time.    Follow-Up: [facility] and your [facility] needs are our priority.  As part of our continuing mission to provide you with exceptional heart care, we have created designated Provider Care Teams.  These Care Teams include your primary Cardiologist (physician) and Advanced Practice Providers (APPs -  Physician Assistants and Nurse Practitioners) who all work together to provide you with the care you need, when you need it.  We recommend signing up for the patient portal called "MyChart".  Sign up information is provided on this After Visit Summary.  MyChart is used to connect with patients for Virtual Visits (Telemedicine).  Patients are able to view lab/test results, encounter notes, upcoming appointments, etc.  Non-urgent messages can be sent to your provider as well.   To learn more about what you can do with MyChart, go to [email or web address]    Your next appointment:   6 month(s)  The format for your next appointment:   In Person  Provider:   MSanda Klein MD         Signed, MSanda Klein MD  06/07/2022 1:44 PM    [email or web address]  [place], [place]  [address] Phone: [phone]; Fax: [phone]

## 2022-06-06 NOTE — Patient Instructions (Signed)
Medication Instructions:  INCREASE furosemide (lasix) to '120mg'$  daily -- this is one and one-half of the '80mg'$  tablets INCREASE potassium to 53mq twice daily   *If you need a refill on your cardiac medications before your next appointment, please call your pharmacy*   Lab Work: BMET and BNP today  If you have labs (blood work) drawn today and your tests are completely normal, you will receive your results only by: [place](if you have MyChart) OR A paper copy in the mail If you have any lab test that is abnormal or we need to change your treatment, we will call you to review the results.   Testing/Procedures: Your physician has requested that you have an echocardiogram. Echocardiography is a painless test that uses sound waves to create images of your heart. It provides your doctor with information about the size and shape of your heart and how well your heart's chambers and valves are working. This procedure takes approximately one hour. There are no restrictions for this procedure. Please do NOT wear cologne, perfume, aftershave, or lotions (deodorant is allowed). Please arrive 15 minutes prior to your appointment time.    Follow-Up: [facility] and your [facility] needs are our [facility].  As part of our continuing mission to provide you with exceptional heart care, we have created designated Provider Care Teams.  These Care Teams include your primary Cardiologist (physician) and Advanced Practice Providers (APPs -  Physician Assistants and Nurse Practitioners) who all work together to provide you with the care you need, when you need it.  We recommend signing up for the patient portal called "MyChart".  Sign up information is provided on this After Visit Summary.  MyChart is used to connect with patients for Virtual Visits (Telemedicine).  Patients are able to view lab/test results, encounter notes, upcoming appointments, etc.  Non-urgent messages can be sent to your  provider as well.   To learn more about what you can do with MyChart, go to [email or web address]    Your next appointment:   6 month(s)  The format for your next appointment:   In Person  Provider:   MSanda Klein MD

## 2022-06-10 ENCOUNTER — Other Ambulatory Visit: Payer: Self-pay | Admitting: Family Medicine

## 2022-06-10 LAB — BASIC METABOLIC PANEL
BUN/Creatinine Ratio: 11 — ABNORMAL LOW (ref 12–28)
BUN: 15 mg/dL (ref 8–27)
CO2: 28 mmol/L (ref 20–29)
Calcium: 9.2 mg/dL (ref 8.7–10.3)
Chloride: 98 mmol/L (ref 96–106)
Creatinine, Ser: 1.4 mg/dL — ABNORMAL HIGH (ref 0.57–1.00)
Glucose: 76 mg/dL (ref 70–99)
Potassium: 4.3 mmol/L (ref 3.5–5.2)
Sodium: 140 mmol/L (ref 134–144)
eGFR: 39 mL/min/{1.73_m2} — ABNORMAL LOW (ref >59)

## 2022-06-10 LAB — BRAIN NATRIURETIC PEPTIDE: BNP: 205.1 pg/mL — ABNORMAL HIGH (ref 0.0–100.0)

## 2022-06-13 ENCOUNTER — Ambulatory Visit (INDEPENDENT_AMBULATORY_CARE_PROVIDER_SITE_OTHER): Payer: Medicare Other | Admitting: Adult Health

## 2022-06-13 ENCOUNTER — Ambulatory Visit: Payer: Medicare Other | Admitting: Adult Health

## 2022-06-13 ENCOUNTER — Encounter: Payer: Self-pay | Admitting: Adult Health

## 2022-06-13 VITALS — BP 118/80 | HR 62 | Temp 94.8°F | Ht 59.0 in | Wt 126.2 lb

## 2022-06-13 DIAGNOSIS — R350 Frequency of micturition: Secondary | ICD-10-CM | POA: Diagnosis not present

## 2022-06-13 DIAGNOSIS — E1122 Type 2 diabetes mellitus with diabetic chronic kidney disease: Secondary | ICD-10-CM | POA: Diagnosis not present

## 2022-06-13 DIAGNOSIS — N1832 Chronic kidney disease, stage 3b: Secondary | ICD-10-CM

## 2022-06-13 DIAGNOSIS — I4891 Unspecified atrial fibrillation: Secondary | ICD-10-CM

## 2022-06-13 DIAGNOSIS — I2510 Atherosclerotic heart disease of native coronary artery without angina pectoris: Secondary | ICD-10-CM

## 2022-06-13 DIAGNOSIS — I5032 Chronic diastolic (congestive) heart failure: Secondary | ICD-10-CM | POA: Diagnosis not present

## 2022-06-13 LAB — POCT URINALYSIS DIPSTICK
Bilirubin, UA: NEGATIVE
Glucose, UA: NEGATIVE
Ketones, UA: NEGATIVE
Nitrite, UA: NEGATIVE
Protein, UA: POSITIVE — AB
Spec Grav, UA: 1.015 (ref 1.010–1.025)
Urobilinogen, UA: 0.2 E.U./dL
pH, UA: 6 (ref 5.0–8.0)

## 2022-06-13 NOTE — Progress Notes (Signed)
[facility] clinic  Provider: Durenda Age DNP  Code Status: Full Code  Goals of Care:     05/28/2022    2:29 PM  Advanced Directives  Does Patient Have a Medical Advance Directive? Yes  Type of Advance Directive [place]  Does patient want to make changes to medical advance directive? No - Patient declined  Copy of [place] in Chart? No - copy requested     Chief Complaint  Patient presents with   Acute Visit    Patient presents today for possible urinary tract infection    HPI: Patient  She is a 77 y.o. female seen today for an acute visit for possible UTI. She has a PMH of atrial fibrillation, chronic diastolic CHF and hypertension. She was recently treated for maxillary sinusitis with Z-pak, completed 2 weeks ago. She stated that she feels better now. She takes Lasix for CHF, was recently increased from 80 mg daily to 120 mg daily due to orthopnea, 06/06/22. Today, she denies shortness of breath. She complained of not able to empty her bladder x 2 nights. She denies dysuria, chills nor fever. She, also, complained of increase in urinary frequency.  Urine dipstick showed  moderate blood, trace protein and small leukocyte.    Past Medical History:  Diagnosis Date   Acute on chronic diastolic CHF (congestive heart failure), NYHA class 1 (HCC) 04/29/2013   Allergic rhinitis    Atrial fibrillation (HCC)    Pacemaker   Brady-tachy syndrome (HCC)    s/p STJ dual chamber PPM by Dr Sallyanne Kuster   CAD (coronary artery disease)    70% RCA stenosis, treated medically   Control of atrial fibrillation with pacemaker ([place])    Diabetes mellitus ([place])    DJD (degenerative joint disease)    Dysrhythmia    Paroxysmal Atrial Fib.   Hypertension    Hyperthyroidism    Paroxysmal atrial fibrillation (HCC)    Polio    as a child   PONV (postoperative nausea and vomiting)    Presence of permanent cardiac pacemaker    Pulmonary disease     Past  Surgical History:  Procedure Laterality Date   arm surgery d/t fx Right 8   ATRIAL FIBRILLATION ABLATION  07/31/2012; 08-10-13   PVI by Dr Rayann Heman   ATRIAL FIBRILLATION ABLATION N/A 07/31/2012   Procedure: ATRIAL FIBRILLATION ABLATION;  Surgeon: Thompson Grayer, MD;  Location: [facility] CATH LAB;  Service: Cardiovascular;  Laterality: N/A;   ATRIAL FIBRILLATION ABLATION N/A 08/10/2013   Procedure: ATRIAL FIBRILLATION ABLATION;  Surgeon: Coralyn Mark, MD;  Location: [place] CATH LAB;  Service: Cardiovascular;  Laterality: N/A;   BACK SURGERY  07/26/2012   dR. [facility]   CARDIAC CATHETERIZATION  12/03/2010   single vessel mid RCA   CARDIAC ELECTROPHYSIOLOGY MAPPING AND ABLATION  07/31/2012   Dr. Thompson Grayer   CESAREAN SECTION  09/20/1974   Dr. Gertie Fey   CHOLECYSTECTOMY  1994   COLONOSCOPY WITH PROPOFOL N/A 03/21/2015   Procedure: COLONOSCOPY WITH PROPOFOL;  Surgeon: Juanita Craver, MD;  Location: [facility] ENDOSCOPY;  Service: Endoscopy;  Laterality: N/A;   DIAGNOSTIC MAMMOGRAM  04/16/2017   [place] Morton. Morris, DO   LAPAROSCOPIC HYSTERECTOMY  Late 41s to early 31s   LEFT HEART CATH AND CORONARY ANGIOGRAPHY N/A 07/30/2017   Procedure: LEFT HEART CATH AND CORONARY ANGIOGRAPHY;  Surgeon: Troy Sine, MD;  Location: [place] CV LAB;  Service: Cardiovascular;  Laterality: N/A;   LUMBAR LAMINECTOMY/DECOMPRESSION MICRODISCECTOMY Right 12/23/2012  Procedure: Right L5-S1 Laminotomy for resection of synovial cyst;  Surgeon: Hosie Spangle, MD;  Location: [facility] NEURO ORS;  Service: Neurosurgery;  Laterality: Right;  Right Lumbar five-sacral one Laminotomy for resection of synovial cyst   NM MYOCAR PERF WALL MOTION  12/20/2010   normal   PACEMAKER IMPLANT N/A 08/09/2019   Procedure: PACEMAKER IMPLANT;  Surgeon: Sanda Klein, MD;  Location: [place] CV LAB;  Service: Cardiovascular;  Laterality: N/A;   PACEMAKER INSERTION  12/03/10   SJM Accent DR RF implanted by Dr Sallyanne Kuster   RIGHT HEART CATH N/A 04/22/2018   Procedure:  RIGHT HEART CATH;  Surgeon: Wellington Hampshire, MD;  Location: [place] CV LAB;  Service: Cardiovascular;  Laterality: N/A;   TEE WITHOUT CARDIOVERSION N/A 07/30/2012   Procedure: TRANSESOPHAGEAL ECHOCARDIOGRAM (TEE);  Surgeon: Sanda Klein, MD;  Location: [facility] ENDOSCOPY;  Service: Cardiovascular;  Laterality: N/A;  h&p in file-hope   TEE WITHOUT CARDIOVERSION N/A 08/09/2013   Procedure: TRANSESOPHAGEAL ECHOCARDIOGRAM (TEE);  Surgeon: Lelon Perla, MD;  Location: [facility] ENDOSCOPY;  Service: Cardiovascular;  Laterality: N/A;   TOTAL ABDOMINAL HYSTERECTOMY  1981   ULTRASOUND GUIDANCE FOR VASCULAR ACCESS  04/22/2018   Procedure: Ultrasound Guidance For Vascular Access;  Surgeon: Wellington Hampshire, MD;  Location: [place] CV LAB;  Service: Cardiovascular;;    Allergies  Allergen Reactions   Codeine Nausea And Vomiting and Nausea Only   Ketorolac Nausea And Vomiting, Nausea Only and Other (See Comments)    "pt told to never take this med"   Meperidine Nausea Only   Demerol Nausea And Vomiting   Dronedarone Other (See Comments)    Increased fluid retention   Cyclobenzaprine Nausea And Vomiting    Outpatient Encounter Medications as of 06/13/2022  Medication Sig   apixaban (ELIQUIS) 5 MG TABS tablet Take 1 tablet (5 mg total) by mouth 2 (two) times daily.   benzonatate (TESSALON PERLES) 100 MG capsule Take 1 capsule (100 mg total) by mouth 3 (three) times daily.   Cholecalciferol (VITAMIN D3) 1000 UNITS CAPS Take 1,000 Units by mouth daily.   Cyanocobalamin (VITAMIN B12 PO) Take 1 tablet by mouth daily as needed (for energy).   furosemide (LASIX) 80 MG tablet Take 1.5 tablets (120 mg total) by mouth daily.   glucose blood (ONETOUCH ULTRA) test strip USE DAILY TO CHECK BLOOD SUGAR E11.8   Lancets Misc. (ONE TOUCH SURESOFT) MISC Use to test  Blood sugar once daily DX: E11.8,   loperamide (IMODIUM) 2 MG capsule Take 2 mg by mouth as needed for diarrhea or loose stools.    methimazole  (TAPAZOLE) 10 MG tablet TAKE 1 TABLET BY MOUTH ONCE DAILY WITH FOOD FOR THYROID   metoprolol tartrate (LOPRESSOR) 50 MG tablet TAKE 1 TABLET BY MOUTH ONCE DAILY IN THE MORNING THEN TAKE 1/2 (ONE-HALF) TABLET ONCE DAILY IN THE EVENING   ondansetron (ZOFRAN) 4 MG tablet Take 1 tablet (4 mg total) by mouth every 8 (eight) hours as needed for nausea or vomiting.   oxybutynin (DITROPAN) 5 MG tablet Take 1 tablet by mouth once daily   pantoprazole (PROTONIX) 40 MG tablet Take 1 tablet by mouth once daily   potassium chloride SA (KLOR-CON M) 20 MEQ tablet Take 1 tablet (20 mEq total) by mouth 2 (two) times daily.   sildenafil (REVATIO) 20 MG tablet TAKE 1 TABLET BY MOUTH THREE TIMES DAILY   sitaGLIPtin (JANUVIA) 100 MG tablet Take 1 tablet by mouth once daily   No facility-administered encounter medications  on file as of 06/13/2022.    Review of Systems:  Review of Systems  Constitutional:  Negative for appetite change, chills, fatigue and fever.  HENT:  Negative for congestion, hearing loss, rhinorrhea and sore throat.   Eyes: Negative.   Respiratory:  Negative for cough, shortness of breath and wheezing.   Cardiovascular:  Negative for chest pain, palpitations and leg swelling.  Gastrointestinal:  Negative for abdominal pain, constipation, diarrhea, nausea and vomiting.  Genitourinary:  Positive for frequency. Negative for dysuria, flank pain and hematuria.  Musculoskeletal:  Negative for arthralgias, back pain and myalgias.  Skin:  Negative for color change, rash and wound.  Neurological:  Negative for dizziness, weakness and headaches.  Psychiatric/Behavioral:  Negative for behavioral problems. The patient is not nervous/anxious.     Health Maintenance  Topic Date Due   Medicare Annual Wellness (AWV)  Never done   OPHTHALMOLOGY EXAM  Never done   Zoster Vaccines- Shingrix (1 of 2) Never done   Pneumonia Vaccine 42+ Years old (2 - PCV) 02/10/2019   FOOT EXAM  11/15/2021   COVID-19  Vaccine (5 - 2023-24 season) 02/01/2022   HEMOGLOBIN A1C  05/22/2022   Diabetic kidney evaluation - Urine ACR  11/21/2022   DTaP/Tdap/Td (2 - Td or Tdap) 03/06/2023   Diabetic kidney evaluation - eGFR measurement  06/07/2023   INFLUENZA VACCINE  Completed   DEXA SCAN  Completed   Hepatitis C Screening  Completed   HPV VACCINES  Aged Out   COLONOSCOPY (Pts 45-52yr Insurance coverage will need to be confirmed)  Discontinued    Physical Exam: Vitals:   06/13/22 1322  BP: 118/80  Pulse: 62  Temp: (!) 94.8 F (34.9 C)  SpO2: 96%  Weight: 126 lb 3.2 oz (57.2 kg)  Height: '4\' 11"'$  (1.499 m)   Body mass index is 25.49 kg/m. Physical Exam Constitutional:      Appearance: Normal appearance.  HENT:     Head: Normocephalic and atraumatic.     Nose: Nose normal.     Mouth/Throat:     Mouth: Mucous membranes are moist.  Eyes:     Conjunctiva/sclera: Conjunctivae normal.  Neck:     Comments: Right side goiter Cardiovascular:     Rate and Rhythm: Normal rate and regular rhythm.     Comments: Left chest pacemaker  Pulmonary:     Effort: Pulmonary effort is normal.     Breath sounds: Normal breath sounds.  Abdominal:     General: Bowel sounds are normal.     Palpations: Abdomen is soft.  Musculoskeletal:        General: Normal range of motion.     Cervical back: Normal range of motion.  Skin:    General: Skin is warm and dry.  Neurological:     General: No focal deficit present.     Mental Status: She is alert and oriented to person, place, and time.  Psychiatric:        Mood and Affect: Mood normal.        Behavior: Behavior normal.        Thought Content: Thought content normal.        Judgment: Judgment normal.     Labs reviewed: Basic Metabolic Panel: Recent Labs    07/25/21 1638 06/06/22 1027  NA 137 140  K 4.7 4.3  CL 98 98  CO2 34* 28  GLUCOSE 111* 76  BUN 14 15  CREATININE 1.23* 1.40*  CALCIUM 9.4 9.2  TSH 6.42*  --  Liver Function Tests: No  results for input(s): "AST", "ALT", "ALKPHOS", "BILITOT", "PROT", "ALBUMIN" in the last 8760 hours. No results for input(s): "LIPASE", "AMYLASE" in the last 8760 hours. No results for input(s): "AMMONIA" in the last 8760 hours. CBC: Recent Labs    08/10/21 1346  WBC 11.6*  NEUTROABS 9,988*  HGB 13.6  HCT 41.1  MCV 89.5  PLT 268   Lipid Panel: No results for input(s): "CHOL", "HDL", "LDLCALC", "TRIG", "CHOLHDL", "LDLDIRECT" in the last 8760 hours. Lab Results  Component Value Date   HGBA1C 6.2 (H) 11/20/2021    Procedures since last visit: No results found.  Assessment/Plan  1. Urinary frequency - POCT urinalysis dipstick showed  moderate blood, trace protein and small leukocyte. - Urine Culture  2. Atrial fibrillation, unspecified type ([place]) -  rate-controlled -  continue Eliquis for anticoagulation  3. Chronic diastolic heart failure (HCC) -  no shortness of breath -  continue Lasix  4. Type 2 diabetes mellitus with stage 3b chronic kidney disease, without long-term current use of insulin ([place]) Lab Results  Component Value Date   HGBA1C 6.2 (H) 11/20/2021   -  continue Januvia    Labs/tests ordered:  POC urine dipstick and urine culture  Next appt:  11/13/2022

## 2022-06-13 NOTE — Patient Instructions (Signed)

## 2022-06-14 LAB — URINE CULTURE
MICRO NUMBER:: 14419502
SPECIMEN QUALITY:: ADEQUATE

## 2022-06-18 NOTE — Progress Notes (Signed)
Urine culture showed mixed genital flora which might be due to contamination during collection. If she is still having urinary symptoms then needs to have recollection for another urine culture.

## 2022-06-24 DIAGNOSIS — R1310 Dysphagia, unspecified: Secondary | ICD-10-CM | POA: Diagnosis not present

## 2022-06-24 DIAGNOSIS — E049 Nontoxic goiter, unspecified: Secondary | ICD-10-CM | POA: Diagnosis not present

## 2022-06-25 ENCOUNTER — Telehealth: Payer: Self-pay | Admitting: *Deleted

## 2022-06-25 NOTE — Telephone Encounter (Signed)
Patient called wanting to change her pharmacy from [facility] to [facility] in [place].  Pharmacy Updated.   Patient also requested refill on Spirolactone. Medication is not in Current Medication list and looks like it was Discontinue.   Patient is going to call Cardiologist office, Dr. Sallyanne Kuster to confirm medications. Agreed.

## 2022-06-26 ENCOUNTER — Encounter: Payer: Self-pay | Admitting: Emergency Medicine

## 2022-06-26 ENCOUNTER — Ambulatory Visit (INDEPENDENT_AMBULATORY_CARE_PROVIDER_SITE_OTHER): Payer: Medicare Other | Admitting: Emergency Medicine

## 2022-06-26 VITALS — BP 120/74 | HR 61 | Temp 98.7°F | Ht 59.0 in | Wt 131.0 lb

## 2022-06-26 DIAGNOSIS — G4733 Obstructive sleep apnea (adult) (pediatric): Secondary | ICD-10-CM

## 2022-06-26 DIAGNOSIS — E042 Nontoxic multinodular goiter: Secondary | ICD-10-CM | POA: Diagnosis not present

## 2022-06-26 DIAGNOSIS — I5032 Chronic diastolic (congestive) heart failure: Secondary | ICD-10-CM

## 2022-06-26 DIAGNOSIS — I2510 Atherosclerotic heart disease of native coronary artery without angina pectoris: Secondary | ICD-10-CM | POA: Diagnosis not present

## 2022-06-26 NOTE — Patient Instructions (Addendum)
We will continue to use CPAP every night. Agree with plans for your thyroid surgery with ENT.  Your are at moderate risk for general anesthesia and surgery due to your cardiac disease, underlying pulmonary disease, sleep apnea.  This does not preclude you from having the surgery since the benefits outweigh the risks as long as your underlying problems are stable. Depending on how you are doing postoperatively we may decide to repeat a sleep study to see if you still require CPAP or if we should adjust your air pressure. Follow-up with Dr. Lamonte Sakai in 3 months or sooner if any problems

## 2022-06-26 NOTE — Progress Notes (Signed)
Subjective:    Patient ID: Hailey [name], female    DOB: 1945-10-06, 77 y.o.   MRN: [id number]  HPI  ROV 11/29/2021 --77 year old woman with restrictive lung disease and multifactorial secondary pulmonary hypertension in the setting of A-fib, chronic diastolic CHF, systolic hypertension.  She has an ANA of unclear significance, hypothyroidism with a goiter.  I referred her to ENT regarding the goiter, appeared to be larger on CT scan of the neck but not impacting the trachea..  We also arranged for home sleep study > done on 11/04/2021 and showed mild OSA with an AHI 5.6/h, desaturation nadir 81%. She is planning to have a swallowing eval on advice of Dr Donne Anon, planning to see Endocrinology Her breathing is stable. She is able to do housework but has to stop to rest. Occurs w walking as well   ROV 06/26/2022 --77 year old woman with history restrictive lung disease and positive ANA of unclear significance, multifactorial secondary pulmonary hypertension in the setting of this as well as atrial fibrillation, chronic diastolic CHF, systolic hypertension.  Also with goiter (not impacted the trachea).  A swallowing evaluation showed mild rightward deviation esophagus, some temporary delay barium tablet.  She is planning to have thyroid surgery in February.   She has a new diagnosis of mild OSA and we started AutoSet CPAP 5-20 cmH2O with nasal pillows > difficult to tolerate so she changed to triangle nasal mask. Compliance data shows she wore 11 out of last 30 nights, but this is misleading because she had a sinus infection that impacted her usage for about 2 weeks. Her energy level is a bit better on CPAP.    Review of Systems  Constitutional:  Positive for unexpected weight change. Negative for fever.  HENT:  Negative for congestion, dental problem, ear pain, nosebleeds, postnasal drip, rhinorrhea, sinus pressure, sneezing, sore throat and trouble swallowing.   Eyes:  Negative for redness and  itching.  Respiratory:  Positive for cough and shortness of breath. Negative for chest tightness and wheezing.   Cardiovascular:  Negative for palpitations and leg swelling.  Gastrointestinal:  Negative for nausea and vomiting.  Genitourinary:  Negative for dysuria.  Musculoskeletal:  Negative for joint swelling.  Skin:  Negative for rash.  Allergic/Immunologic: Negative.  Negative for environmental allergies, food allergies and immunocompromised state.  Neurological:  Negative for headaches.  Hematological:  Does not bruise/bleed easily.  Psychiatric/Behavioral:  Negative for dysphoric mood. The patient is not nervous/anxious.      Past Medical History:  Diagnosis Date   Acute on chronic diastolic CHF (congestive heart failure), NYHA class 1 (HCC) 04/29/2013   Allergic rhinitis    Atrial fibrillation (HCC)    Pacemaker   Brady-tachy syndrome (HCC)    s/p STJ dual chamber PPM by Dr Sallyanne Kuster   CAD (coronary artery disease)    70% RCA stenosis, treated medically   Control of atrial fibrillation with pacemaker ([place])    Diabetes mellitus ([place])    DJD (degenerative joint disease)    Dysrhythmia    Paroxysmal Atrial Fib.   Hypertension    Hyperthyroidism    Paroxysmal atrial fibrillation (HCC)    Polio    as a child   PONV (postoperative nausea and vomiting)    Presence of permanent cardiac pacemaker    Pulmonary disease      Family History  Problem Relation Age of Onset   Cancer Brother    Heart attack Sister    Breast cancer Sister  Diabetes Sister    Heart disease Sister    Breast cancer Sister    Dementia Sister    Colon cancer Sister    Heart disease Sister    Breast cancer Sister    Suicidality Brother    Prostate cancer Brother    Dementia Brother      Social History   Socioeconomic History   Marital status: Married    Spouse name: [name]   Number of children: 3   Years of education: 13   Highest education level: Some college, no degree  Occupational  History   Occupation: housewife  Tobacco Use   Smoking status: Never   Smokeless tobacco: Never  Vaping Use   Vaping Use: Never used  Substance and Sexual Activity   Alcohol use: No   Drug use: No   Sexual activity: Not Currently    Birth control/protection: Abstinence  Other Topics Concern   Not on file  Social History Narrative   Lives in [place].  Retired.  Was office clerk in several companies.  Did some technical school after completing high school.  She has no pets.  She and her husband live in a one-story home.  They've been married since 1965.  She follows a heart healthy diet.  She has a living will and hcpoa but does not have a DNR form.  She does struggle to prepare food.     Social Determinants of Health   Financial Resource Strain: Not on file  Food Insecurity: Not on file  Transportation Needs: Not on file  Physical Activity: Not on file  Stress: Not on file  Social Connections: Not on file  Intimate Partner Violence: Not on file     Allergies  Allergen Reactions   Codeine Nausea And Vomiting and Nausea Only   Ketorolac Nausea And Vomiting, Nausea Only and Other (See Comments)    "pt told to never take this med"   Meperidine Nausea Only   Demerol Nausea And Vomiting   Dronedarone Other (See Comments)    Increased fluid retention   Cyclobenzaprine Nausea And Vomiting     Outpatient Medications Prior to Visit  Medication Sig Dispense Refill   apixaban (ELIQUIS) 5 MG TABS tablet Take 1 tablet (5 mg total) by mouth 2 (two) times daily. 60 tablet 11   benzonatate (TESSALON PERLES) 100 MG capsule Take 1 capsule (100 mg total) by mouth 3 (three) times daily. 21 capsule 0   Cholecalciferol (VITAMIN D3) 1000 UNITS CAPS Take 1,000 Units by mouth daily.     Cyanocobalamin (VITAMIN B12 PO) Take 1 tablet by mouth daily as needed (for energy).     furosemide (LASIX) 80 MG tablet Take 1.5 tablets (120 mg total) by mouth daily. 135 tablet 1   glucose blood (ONETOUCH  ULTRA) test strip USE DAILY TO CHECK BLOOD SUGAR E11.8 100 each 11   Lancets Misc. (ONE TOUCH SURESOFT) MISC Use to test  Blood sugar once daily DX: E11.8, 1 each 0   loperamide (IMODIUM) 2 MG capsule Take 2 mg by mouth as needed for diarrhea or loose stools.      methimazole (TAPAZOLE) 10 MG tablet TAKE 1 TABLET BY MOUTH ONCE DAILY WITH FOOD FOR THYROID 90 tablet 1   metoprolol tartrate (LOPRESSOR) 50 MG tablet TAKE 1 TABLET BY MOUTH ONCE DAILY IN THE MORNING THEN TAKE 1/2 (ONE-HALF) TABLET ONCE DAILY IN THE EVENING 135 tablet 2   ondansetron (ZOFRAN) 4 MG tablet Take 1 tablet (4 mg total)  by mouth every 8 (eight) hours as needed for nausea or vomiting. 20 tablet 0   oxybutynin (DITROPAN) 5 MG tablet Take 1 tablet by mouth once daily 90 tablet 0   pantoprazole (PROTONIX) 40 MG tablet Take 1 tablet by mouth once daily 90 tablet 1   potassium chloride SA (KLOR-CON M) 20 MEQ tablet Take 1 tablet (20 mEq total) by mouth 2 (two) times daily. 180 tablet 1   sildenafil (REVATIO) 20 MG tablet TAKE 1 TABLET BY MOUTH THREE TIMES DAILY 90 tablet 6   sitaGLIPtin (JANUVIA) 100 MG tablet Take 1 tablet by mouth once daily 90 tablet 1   No facility-administered medications prior to visit.       Objective:   Physical Exam Vitals:   06/26/22 1135  BP: 120/74  Pulse: 61  Temp: 98.7 F (37.1 C)  TempSrc: Oral  SpO2: 97%  Weight: 131 lb (59.4 kg)  Height: '4\' 11"'$  (1.499 m)    Gen: Pleasant, well-nourished elderly woman, in no distress,  normal affect  ENT: No lesions,  mouth clear,  oropharynx clear, no postnasal drip  Neck: JVD difficult to assess, she has a large goiter palpable on the R  Lungs: No use of accessory muscles, no crackles or wheezing on normal respiration, no wheeze on forced expiration  Cardiovascular: RRR, heart sounds normal, no murmur or gallops, no peripheral edema  Musculoskeletal: No deformities, no cyanosis or clubbing  Neuro: alert, awake, non focal  Skin: Warm, no  lesions or rash      Assessment & Plan:  Mild obstructive sleep apnea She is working on tolerating CPAP, has a new mask.  Wearing fairly reliably although she has a gap in usage due to recent sinusitis.  She does believe she is getting clinical benefit.  Will try to continue this.  She is going to have her thyroid surgery and question whether this may help any component of her upper airway obstruction.  There was no overt tracheal deviation or obstruction on chest and neck imaging.  Chronic diastolic heart failure ([place]) Following with cardiology, recently had her diuretics adjusted and does feel better, has less dyspnea, less edema.  Multinodular goiter She is at moderate to high risk for general anesthesia and surgery due to her underlying cardiac disease, restrictive lung disease, sleep apnea.  That said she is compensated, and there is no absolute contraindication to proceeding with surgery as long as the benefits outweigh the risks.  I do believe she needs to try to wear her CPAP after the operation, as soon as cleared to do so by ENT   Baltazar Apo, MD, PhD 06/26/2022, 1:13 PM [facility] [phone] or if no answer [phone]

## 2022-06-26 NOTE — Assessment & Plan Note (Signed)
She is working on tolerating CPAP, has a new mask.  Wearing fairly reliably although she has a gap in usage due to recent sinusitis.  She does believe she is getting clinical benefit.  Will try to continue this.  She is going to have her thyroid surgery and question whether this may help any component of her upper airway obstruction.  There was no overt tracheal deviation or obstruction on chest and neck imaging.

## 2022-06-26 NOTE — Assessment & Plan Note (Signed)
Following with cardiology, recently had her diuretics adjusted and does feel better, has less dyspnea, less edema.

## 2022-06-26 NOTE — Assessment & Plan Note (Signed)
She is at moderate to high risk for general anesthesia and surgery due to her underlying cardiac disease, restrictive lung disease, sleep apnea.  That said she is compensated, and there is no absolute contraindication to proceeding with surgery as long as the benefits outweigh the risks.  I do believe she needs to try to wear her CPAP after the operation, as soon as cleared to do so by ENT

## 2022-06-26 NOTE — H&P (View-Only) (Signed)
Subjective:    Patient ID: Hailey [name], female    DOB: 11/13/1945, 77 y.o.   MRN: [id number]  HPI  ROV 11/29/2021 --77 year old woman with restrictive lung disease and multifactorial secondary pulmonary hypertension in the setting of A-fib, chronic diastolic CHF, systolic hypertension.  She has an ANA of unclear significance, hypothyroidism with a goiter.  I referred her to ENT regarding the goiter, appeared to be larger on CT scan of the neck but not impacting the trachea..  We also arranged for home sleep study > done on 11/04/2021 and showed mild OSA with an AHI 5.6/h, desaturation nadir 81%. She is planning to have a swallowing eval on advice of Dr Donne Anon, planning to see Endocrinology Her breathing is stable. She is able to do housework but has to stop to rest. Occurs w walking as well   ROV 06/26/2022 --77 year old woman with history restrictive lung disease and positive ANA of unclear significance, multifactorial secondary pulmonary hypertension in the setting of this as well as atrial fibrillation, chronic diastolic CHF, systolic hypertension.  Also with goiter (not impacted the trachea).  A swallowing evaluation showed mild rightward deviation esophagus, some temporary delay barium tablet.  She is planning to have thyroid surgery in February.   She has a new diagnosis of mild OSA and we started AutoSet CPAP 5-20 cmH2O with nasal pillows > difficult to tolerate so she changed to triangle nasal mask. Compliance data shows she wore 11 out of last 30 nights, but this is misleading because she had a sinus infection that impacted her usage for about 2 weeks. Her energy level is a bit better on CPAP.    Review of Systems  Constitutional:  Positive for unexpected weight change. Negative for fever.  HENT:  Negative for congestion, dental problem, ear pain, nosebleeds, postnasal drip, rhinorrhea, sinus pressure, sneezing, sore throat and trouble swallowing.   Eyes:  Negative for redness and  itching.  Respiratory:  Positive for cough and shortness of breath. Negative for chest tightness and wheezing.   Cardiovascular:  Negative for palpitations and leg swelling.  Gastrointestinal:  Negative for nausea and vomiting.  Genitourinary:  Negative for dysuria.  Musculoskeletal:  Negative for joint swelling.  Skin:  Negative for rash.  Allergic/Immunologic: Negative.  Negative for environmental allergies, food allergies and immunocompromised state.  Neurological:  Negative for headaches.  Hematological:  Does not bruise/bleed easily.  Psychiatric/Behavioral:  Negative for dysphoric mood. The patient is not nervous/anxious.      Past Medical History:  Diagnosis Date   Acute on chronic diastolic CHF (congestive heart failure), NYHA class 1 (HCC) 04/29/2013   Allergic rhinitis    Atrial fibrillation (HCC)    Pacemaker   Brady-tachy syndrome (HCC)    s/p STJ dual chamber PPM by Dr Sallyanne Kuster   CAD (coronary artery disease)    70% RCA stenosis, treated medically   Control of atrial fibrillation with pacemaker ([place])    Diabetes mellitus ([place])    DJD (degenerative joint disease)    Dysrhythmia    Paroxysmal Atrial Fib.   Hypertension    Hyperthyroidism    Paroxysmal atrial fibrillation (HCC)    Polio    as a child   PONV (postoperative nausea and vomiting)    Presence of permanent cardiac pacemaker    Pulmonary disease      Family History  Problem Relation Age of Onset   Cancer Brother    Heart attack Sister    Breast cancer Sister  Diabetes Sister    Heart disease Sister    Breast cancer Sister    Dementia Sister    Colon cancer Sister    Heart disease Sister    Breast cancer Sister    Suicidality Brother    Prostate cancer Brother    Dementia Brother      Social History   Socioeconomic History   Marital status: Married    Spouse name: [name]   Number of children: 3   Years of education: 13   Highest education level: Some college, no degree  Occupational  History   Occupation: housewife  Tobacco Use   Smoking status: Never   Smokeless tobacco: Never  Vaping Use   Vaping Use: Never used  Substance and Sexual Activity   Alcohol use: No   Drug use: No   Sexual activity: Not Currently    Birth control/protection: Abstinence  Other Topics Concern   Not on file  Social History Narrative   Lives in [place].  Retired.  Was office clerk in several companies.  Did some technical school after completing high school.  She has no pets.  She and her husband live in a one-story home.  They've been married since 1965.  She follows a heart healthy diet.  She has a living will and hcpoa but does not have a DNR form.  She does struggle to prepare food.     Social Determinants of Health   Financial Resource Strain: Not on file  Food Insecurity: Not on file  Transportation Needs: Not on file  Physical Activity: Not on file  Stress: Not on file  Social Connections: Not on file  Intimate Partner Violence: Not on file     Allergies  Allergen Reactions   Codeine Nausea And Vomiting and Nausea Only   Ketorolac Nausea And Vomiting, Nausea Only and Other (See Comments)    "pt told to never take this med"   Meperidine Nausea Only   Demerol Nausea And Vomiting   Dronedarone Other (See Comments)    Increased fluid retention   Cyclobenzaprine Nausea And Vomiting     Outpatient Medications Prior to Visit  Medication Sig Dispense Refill   apixaban (ELIQUIS) 5 MG TABS tablet Take 1 tablet (5 mg total) by mouth 2 (two) times daily. 60 tablet 11   benzonatate (TESSALON PERLES) 100 MG capsule Take 1 capsule (100 mg total) by mouth 3 (three) times daily. 21 capsule 0   Cholecalciferol (VITAMIN D3) 1000 UNITS CAPS Take 1,000 Units by mouth daily.     Cyanocobalamin (VITAMIN B12 PO) Take 1 tablet by mouth daily as needed (for energy).     furosemide (LASIX) 80 MG tablet Take 1.5 tablets (120 mg total) by mouth daily. 135 tablet 1   glucose blood (ONETOUCH  ULTRA) test strip USE DAILY TO CHECK BLOOD SUGAR E11.8 100 each 11   Lancets Misc. (ONE TOUCH SURESOFT) MISC Use to test  Blood sugar once daily DX: E11.8, 1 each 0   loperamide (IMODIUM) 2 MG capsule Take 2 mg by mouth as needed for diarrhea or loose stools.      methimazole (TAPAZOLE) 10 MG tablet TAKE 1 TABLET BY MOUTH ONCE DAILY WITH FOOD FOR THYROID 90 tablet 1   metoprolol tartrate (LOPRESSOR) 50 MG tablet TAKE 1 TABLET BY MOUTH ONCE DAILY IN THE MORNING THEN TAKE 1/2 (ONE-HALF) TABLET ONCE DAILY IN THE EVENING 135 tablet 2   ondansetron (ZOFRAN) 4 MG tablet Take 1 tablet (4 mg total)  by mouth every 8 (eight) hours as needed for nausea or vomiting. 20 tablet 0   oxybutynin (DITROPAN) 5 MG tablet Take 1 tablet by mouth once daily 90 tablet 0   pantoprazole (PROTONIX) 40 MG tablet Take 1 tablet by mouth once daily 90 tablet 1   potassium chloride SA (KLOR-CON M) 20 MEQ tablet Take 1 tablet (20 mEq total) by mouth 2 (two) times daily. 180 tablet 1   sildenafil (REVATIO) 20 MG tablet TAKE 1 TABLET BY MOUTH THREE TIMES DAILY 90 tablet 6   sitaGLIPtin (JANUVIA) 100 MG tablet Take 1 tablet by mouth once daily 90 tablet 1   No facility-administered medications prior to visit.       Objective:   Physical Exam Vitals:   06/26/22 1135  BP: 120/74  Pulse: 61  Temp: 98.7 F (37.1 C)  TempSrc: Oral  SpO2: 97%  Weight: 131 lb (59.4 kg)  Height: '4\' 11"'$  (1.499 m)    Gen: Pleasant, well-nourished elderly woman, in no distress,  normal affect  ENT: No lesions,  mouth clear,  oropharynx clear, no postnasal drip  Neck: JVD difficult to assess, she has a large goiter palpable on the R  Lungs: No use of accessory muscles, no crackles or wheezing on normal respiration, no wheeze on forced expiration  Cardiovascular: RRR, heart sounds normal, no murmur or gallops, no peripheral edema  Musculoskeletal: No deformities, no cyanosis or clubbing  Neuro: alert, awake, non focal  Skin: Warm, no  lesions or rash      Assessment & Plan:  Mild obstructive sleep apnea She is working on tolerating CPAP, has a new mask.  Wearing fairly reliably although she has a gap in usage due to recent sinusitis.  She does believe she is getting clinical benefit.  Will try to continue this.  She is going to have her thyroid surgery and question whether this may help any component of her upper airway obstruction.  There was no overt tracheal deviation or obstruction on chest and neck imaging.  Chronic diastolic heart failure ([place]) Following with cardiology, recently had her diuretics adjusted and does feel better, has less dyspnea, less edema.  Multinodular goiter She is at moderate to high risk for general anesthesia and surgery due to her underlying cardiac disease, restrictive lung disease, sleep apnea.  That said she is compensated, and there is no absolute contraindication to proceeding with surgery as long as the benefits outweigh the risks.  I do believe she needs to try to wear her CPAP after the operation, as soon as cleared to do so by ENT   Baltazar Apo, MD, PhD 06/26/2022, 1:13 PM [facility] [place] [phone] or if no answer [phone]

## 2022-07-01 ENCOUNTER — Ambulatory Visit (HOSPITAL_COMMUNITY): Payer: Medicare Other | Attending: Cardiovascular Disease

## 2022-07-01 DIAGNOSIS — R0602 Shortness of breath: Secondary | ICD-10-CM | POA: Diagnosis not present

## 2022-07-01 DIAGNOSIS — Z79899 Other long term (current) drug therapy: Secondary | ICD-10-CM | POA: Diagnosis not present

## 2022-07-01 LAB — ECHOCARDIOGRAM COMPLETE
Area-P 1/2: 4.49 cm2
S' Lateral: 2.9 cm

## 2022-07-01 MED ORDER — PERFLUTREN LIPID MICROSPHERE
1.0000 mL | INTRAVENOUS | Status: AC | PRN
  Administered 2022-07-01: 2 mL via INTRAVENOUS

## 2022-07-02 ENCOUNTER — Telehealth: Payer: Self-pay | Admitting: Cardiovascular Disease

## 2022-07-02 NOTE — Telephone Encounter (Signed)
Follow Up:      Angie called. She wants to know what is the status of this patient's clearance. Please fax asap to [phone].

## 2022-07-02 NOTE — Telephone Encounter (Signed)
Patient is calling to schedule a heart cath with Dr. Sallyanne Kuster. She states she has been advised to do so.

## 2022-07-02 NOTE — Telephone Encounter (Signed)
Received message from Dr.Croitoru: I spoke with Dr. Haroldine Laws and he was going to do it. Sent him the note. I think he will coordinate it all.   I advised with patient she should be hearing from their office, and if she did not per Dr.C by Thursday to call us back.   Patient verbalized understanding

## 2022-07-02 NOTE — Telephone Encounter (Signed)
Contacted patient, advised per ECHO results, she needed a right heart cath- I will be glad to schedule, checking with Dr.Croitoru on details and will call back.   Patient verbalized understanding

## 2022-07-03 NOTE — Telephone Encounter (Signed)
   Returned call to Angie, she will refax clearance and will get it in to process.  She is aware that pt is having to undergo cardiac testing and clearance want be addressed until that time.         Pre-operative Risk Assessment    Patient Name: Hailey [name]  DOB: June 05, 1945 MRN: [id number]      Request for Surgical Clearance    Procedure:   TOTAL THYROIDECTOMY  Date of Surgery:  Clearance TBD                                 Surgeon:  [facility]'s Group or Practice Name:  DR. Diona Foley Phone number:  [phone] Fax number:  [phone]   Type of Clearance Requested:   - Medical  - Pharmacy:  Hold Apixaban (Eliquis) NOT INDICATED HOW LONG   Type of Anesthesia:  Not Indicated   Additional requests/questions:    Astrid Divine   07/03/2022, 8:23 AM

## 2022-07-03 NOTE — Telephone Encounter (Signed)
I am hopeful that we will get all the workup done and have her ready for her thyroid surgery at the end of February as scheduled.

## 2022-07-03 NOTE — Telephone Encounter (Signed)
Returned call to Angie, she will refax clearance and will get it in to process.  She is aware that pt is having to undergo cardiac testing and clearance want be addressed until that time.  She thanked me for the return call.

## 2022-07-03 NOTE — Telephone Encounter (Signed)
Mihai, I read your note on recent echo. In regards to timing for total thyroidectomy, I will notify them that we will await right and left heart catheterization, is that correct?   Please direct your response to p cv div preop.  Thank you, Sharyn Lull

## 2022-07-05 ENCOUNTER — Telehealth (HOSPITAL_COMMUNITY): Payer: Self-pay

## 2022-07-05 ENCOUNTER — Encounter (HOSPITAL_COMMUNITY): Payer: Self-pay | Admitting: Cardiology

## 2022-07-05 ENCOUNTER — Telehealth: Payer: Self-pay | Admitting: Cardiovascular Disease

## 2022-07-05 NOTE — Telephone Encounter (Signed)
Patient stated she is waiting to hear from Dr. Clayborne Dana office to schedule the rt heart cath, requested by Dr. Sallyanne Kuster from echo results.

## 2022-07-05 NOTE — Telephone Encounter (Signed)
Left message at [facility] regarding rt heart cath for patient.

## 2022-07-05 NOTE — Telephone Encounter (Addendum)
Left message for patient to call office to arrange [place] for either Tuesday 02/06 or Friday 02/09  ----- Message from Jolaine Artist, MD sent at 07/05/2022 12:01 PM EST ----- Hailey [name]-   Can you please contact her to find a spot for RHC next week?   ----- Message ----- From: Sanda Klein, MD Sent: 07/01/2022   6:05 PM EST To: Jolaine Artist, MD; Wardell Honour, MD  The echocardiogram shows that the pressure in the pulmonary arteries has increased again. PA pressure was approximately 40 mmHg in 2020 and is now up to about 60 mmHg (similar to what it was when we did her right heart catheterization 2019, before starting treatment with diuretics and sildenafil). The right ventricle is now moderately depressed There is also evidence of high right atrial filling pressures/dilated inferior vena cava. It is time for another right heart catheterization. Called and left message on her voicemail.  Also spoke at length with her daughter Hailey [name].

## 2022-07-05 NOTE — Telephone Encounter (Signed)
        Patient have not heard from Dr Haroldine Laws. She said Dr C told her to call back, if she had not heard anything by Thursday.

## 2022-07-05 NOTE — Telephone Encounter (Signed)
Addressed in other encounter 

## 2022-07-05 NOTE — Telephone Encounter (Signed)
Pt returned call and aware of procedure details

## 2022-07-05 NOTE — Telephone Encounter (Signed)
Pt returned call to triage. Reports Tuesday 2/6 is better  Attempted to return call to pt-no answer LMOM  . [facility] [place] [id number] [place] [address] Dept: [phone] Loc: [phone]  Hailey [name]  07/05/2022  You are scheduled for a Cardiac Catheterization on Tuesday, February 6 with Dr. Glori Bickers.  1. Please arrive at the [facility] (Main Entrance A) at [facility]: [address] [place], [place] [address] at 7:00 AM (This time is two hours before your procedure to ensure your preparation). Free valet parking service is available.   Special note: Every effort is made to have your procedure done on time. Please understand that emergencies sometimes delay scheduled procedures.  2. Diet: Do not eat solid foods after midnight.  The patient may have clear liquids until 5am upon the day of the procedure.  3. Labs: Pre procedure labs will be done at your arrival  4. Medication instructions in preparation for your procedure:   Contrast Allergy: No    Stop taking Eliquis (Apixiban) on Sunday, February 4.  Stop taking, Lasix (Furosemide)  Tuesday, February 6,     On the morning of your procedure, take your Aspirin 81 mg and any morning medicines NOT listed above.  You may use sips of water.  5. Plan for one night stay--bring personal belongings. 6. Bring a current list of your medications and current insurance cards. 7. You MUST have a responsible person to drive you home. 8. Someone MUST be with you the first 24 hours after you arrive home or your discharge will be delayed. 9. Please wear clothes that are easy to get on and off and wear slip-on shoes.  Thank you for allowing [place] to care for you!   -- [facility]

## 2022-07-05 NOTE — Telephone Encounter (Signed)
Follow Up:    Patient wants Dr C to know that she have not heard from Dr Willaim Rayas

## 2022-07-09 ENCOUNTER — Encounter (HOSPITAL_COMMUNITY)
Admission: RE | Disposition: A | Discharge status: Home / Self Care | Payer: Self-pay | Source: Home / Self Care | Attending: Internal Medicine

## 2022-07-09 ENCOUNTER — Ambulatory Visit (HOSPITAL_COMMUNITY)
Admission: RE | Admit: 2022-07-09 | Discharge: 2022-07-09 | Disposition: A | Discharge status: Home / Self Care | Payer: Medicare Other | Attending: Internal Medicine | Admitting: Internal Medicine

## 2022-07-09 ENCOUNTER — Encounter (HOSPITAL_COMMUNITY): Payer: Self-pay | Admitting: Internal Medicine

## 2022-07-09 ENCOUNTER — Other Ambulatory Visit: Payer: Self-pay

## 2022-07-09 ENCOUNTER — Other Ambulatory Visit: Payer: Self-pay | Admitting: Cardiovascular Disease

## 2022-07-09 DIAGNOSIS — I5032 Chronic diastolic (congestive) heart failure: Secondary | ICD-10-CM | POA: Diagnosis not present

## 2022-07-09 DIAGNOSIS — I2721 Secondary pulmonary arterial hypertension: Secondary | ICD-10-CM | POA: Diagnosis not present

## 2022-07-09 DIAGNOSIS — Z79899 Other long term (current) drug therapy: Secondary | ICD-10-CM

## 2022-07-09 DIAGNOSIS — Z8249 Family history of ischemic heart disease and other diseases of the circulatory system: Secondary | ICD-10-CM | POA: Diagnosis not present

## 2022-07-09 DIAGNOSIS — I5033 Acute on chronic diastolic (congestive) heart failure: Secondary | ICD-10-CM

## 2022-07-09 DIAGNOSIS — Z7901 Long term (current) use of anticoagulants: Secondary | ICD-10-CM | POA: Insufficient documentation

## 2022-07-09 DIAGNOSIS — E039 Hypothyroidism, unspecified: Secondary | ICD-10-CM | POA: Insufficient documentation

## 2022-07-09 DIAGNOSIS — G4733 Obstructive sleep apnea (adult) (pediatric): Secondary | ICD-10-CM | POA: Insufficient documentation

## 2022-07-09 DIAGNOSIS — I482 Chronic atrial fibrillation, unspecified: Secondary | ICD-10-CM | POA: Diagnosis not present

## 2022-07-09 DIAGNOSIS — E042 Nontoxic multinodular goiter: Secondary | ICD-10-CM | POA: Diagnosis not present

## 2022-07-09 DIAGNOSIS — I110 Hypertensive heart disease with heart failure: Secondary | ICD-10-CM | POA: Insufficient documentation

## 2022-07-09 DIAGNOSIS — J984 Other disorders of lung: Secondary | ICD-10-CM | POA: Diagnosis not present

## 2022-07-09 DIAGNOSIS — R0602 Shortness of breath: Secondary | ICD-10-CM

## 2022-07-09 HISTORY — PX: RIGHT HEART CATH: CATH118263

## 2022-07-09 LAB — CBC
HCT: 37.7 % (ref 36.0–46.0)
Hemoglobin: 11.8 g/dL — ABNORMAL LOW (ref 12.0–15.0)
MCH: 28.2 pg (ref 26.0–34.0)
MCHC: 31.3 g/dL (ref 30.0–36.0)
MCV: 90 fL (ref 80.0–100.0)
Platelets: 236 10*3/uL (ref 150–400)
RBC: 4.19 MIL/uL (ref 3.87–5.11)
RDW: 14.9 % (ref 11.5–15.5)
WBC: 5.9 10*3/uL (ref 4.0–10.5)
nRBC: 0 % (ref 0.0–0.2)

## 2022-07-09 LAB — POCT I-STAT EG7
Acid-Base Excess: 1 mmol/L (ref 0.0–2.0)
Acid-Base Excess: 3 mmol/L — ABNORMAL HIGH (ref 0.0–2.0)
Bicarbonate: 26.1 mmol/L (ref 20.0–28.0)
Bicarbonate: 27.4 mmol/L (ref 20.0–28.0)
Calcium, Ion: 1.19 mmol/L (ref 1.15–1.40)
Calcium, Ion: 1.21 mmol/L (ref 1.15–1.40)
HCT: 35 % — ABNORMAL LOW (ref 36.0–46.0)
HCT: 36 % (ref 36.0–46.0)
Hemoglobin: 11.9 g/dL — ABNORMAL LOW (ref 12.0–15.0)
Hemoglobin: 12.2 g/dL (ref 12.0–15.0)
O2 Saturation: 58 %
O2 Saturation: 56 %
Potassium: 3.6 mmol/L (ref 3.5–5.1)
Potassium: 3.7 mmol/L (ref 3.5–5.1)
Sodium: 140 mmol/L (ref 135–145)
Sodium: 140 mmol/L (ref 135–145)
TCO2: 27 mmol/L (ref 22–32)
TCO2: 29 mmol/L (ref 22–32)
pCO2, Ven: 40.5 mmHg — ABNORMAL LOW (ref 44–60)
pCO2, Ven: 41.8 mmHg — ABNORMAL LOW (ref 44–60)
pH, Ven: 7.418 (ref 7.25–7.43)
pH, Ven: 7.425 (ref 7.25–7.43)
pO2, Ven: 30 mmHg — CL (ref 32–45)
pO2, Ven: 29 mmHg — CL (ref 32–45)

## 2022-07-09 LAB — BASIC METABOLIC PANEL
Anion gap: 12 (ref 5–15)
BUN: 14 mg/dL (ref 8–23)
CO2: 26 mmol/L (ref 22–32)
Calcium: 9.1 mg/dL (ref 8.9–10.3)
Chloride: 99 mmol/L (ref 98–111)
Creatinine, Ser: 1.16 mg/dL — ABNORMAL HIGH (ref 0.44–1.00)
GFR, Estimated: 49 mL/min — ABNORMAL LOW (ref >60)
Glucose, Bld: 121 mg/dL — ABNORMAL HIGH (ref 70–99)
Potassium: 3.7 mmol/L (ref 3.5–5.1)
Sodium: 137 mmol/L (ref 135–145)

## 2022-07-09 LAB — GLUCOSE, CAPILLARY: Glucose-Capillary: 116 mg/dL — ABNORMAL HIGH (ref 70–99)

## 2022-07-09 SURGERY — RIGHT HEART CATH
Anesthesia: LOCAL

## 2022-07-09 MED ORDER — LABETALOL HCL 5 MG/ML IV SOLN: 10.0000 mg | INTRAVENOUS | Status: DC | PRN

## 2022-07-09 MED ORDER — SODIUM CHLORIDE 0.9 % IV SOLN: INTRAVENOUS | Status: DC

## 2022-07-09 MED ORDER — LIDOCAINE HCL (PF) 1 % IJ SOLN
INTRAMUSCULAR | Status: DC | PRN
  Administered 2022-07-09: 2 mL

## 2022-07-09 MED ORDER — SODIUM CHLORIDE 0.9 % IV SOLN: 250.0000 mL | INTRAVENOUS | Status: DC | PRN

## 2022-07-09 MED ORDER — SPIRONOLACTONE 25 MG PO TABS: 25.0000 mg | ORAL_TABLET | Freq: Every day | ORAL | 3 refills | Status: DC

## 2022-07-09 MED ORDER — SODIUM CHLORIDE 0.9% FLUSH: 3.0000 mL | Freq: Two times a day (BID) | INTRAVENOUS | Status: DC

## 2022-07-09 MED ORDER — HEPARIN (PORCINE) IN NACL 1000-0.9 UT/500ML-% IV SOLN
INTRAVENOUS | Status: DC | PRN
  Administered 2022-07-09: 500 mL

## 2022-07-09 MED ORDER — SODIUM CHLORIDE 0.9% FLUSH: 3.0000 mL | INTRAVENOUS | Status: DC | PRN

## 2022-07-09 MED ORDER — HYDRALAZINE HCL 20 MG/ML IJ SOLN: 10.0000 mg | INTRAMUSCULAR | Status: DC | PRN

## 2022-07-09 MED ORDER — ONDANSETRON HCL 4 MG/2ML IJ SOLN: 4.0000 mg | Freq: Four times a day (QID) | INTRAMUSCULAR | Status: DC | PRN

## 2022-07-09 MED ORDER — ACETAMINOPHEN 325 MG PO TABS: 650.0000 mg | ORAL_TABLET | ORAL | Status: DC | PRN

## 2022-07-09 SURGICAL SUPPLY — 5 items
CATH BALLN WEDGE 5F 110CM (CATHETERS) IMPLANT
GUIDEWIRE .025 260CM (WIRE) IMPLANT
PACK CARDIAC CATHETERIZATION (CUSTOM PROCEDURE TRAY) ×1 IMPLANT
SHEATH GLIDE SLENDER 4/5FR (SHEATH) IMPLANT
TRANSDUCER W/STOPCOCK (MISCELLANEOUS) ×1 IMPLANT

## 2022-07-09 NOTE — Discharge Instructions (Signed)

## 2022-07-09 NOTE — Interval H&P Note (Signed)
History and Physical Interval Note:  07/09/2022 8:38 AM  Hailey [name]  has presented today for surgery, with the diagnosis of pulmonary hypertension.  The various methods of treatment have been discussed with the patient and family. After consideration of risks, benefits and other options for treatment, the patient has consented to  Procedure(s): RIGHT HEART CATH (N/A) as a surgical intervention.  The patient's history has been reviewed, patient examined, no change in status, stable for surgery.  I have reviewed the patient's chart and labs.  Questions were answered to the patient's satisfaction.     Ankush Gintz

## 2022-07-10 ENCOUNTER — Telehealth: Payer: Self-pay | Admitting: *Deleted

## 2022-07-10 DIAGNOSIS — I5033 Acute on chronic diastolic (congestive) heart failure: Secondary | ICD-10-CM

## 2022-07-10 MED ORDER — FUROSEMIDE 80 MG PO TABS: 80.0000 mg | ORAL_TABLET | Freq: Two times a day (BID) | ORAL | 1 refills | Status: DC

## 2022-07-10 NOTE — Telephone Encounter (Signed)
Spoke with pt and daughter, aware of med changes and need for lab work in one week. Lab orders mailed to the pt and Follow up scheduled

## 2022-07-10 NOTE — Telephone Encounter (Signed)
-----   Message from Sanda Klein, MD sent at 07/09/2022 12:12 PM EST ----- Please increase the furosemide to 80 mg twice daily (AM and early afternoon) and add spironolactone 25 mg daily. Check a BMET, BNP, Mg in a week. Any chance we could get an APP appt in 2 weeks? I put the orders in, but please check for me that they are correctly entered, Cheryl.

## 2022-07-12 ENCOUNTER — Other Ambulatory Visit: Payer: Self-pay | Admitting: Cardiovascular Disease

## 2022-07-12 DIAGNOSIS — I4811 Longstanding persistent atrial fibrillation: Secondary | ICD-10-CM

## 2022-07-12 NOTE — Telephone Encounter (Signed)
Prescription refill request for Eliquis received. Indication: Afib  Last office visit: 06/06/21 (Croitoru)  Scr: 1.16 (07/09/22)  Age: 77 Weight: 57.6kg  Appropriate dose. Refill sent.

## 2022-07-12 NOTE — Telephone Encounter (Signed)
*  STAT* If patient is at the pharmacy, call can be transferred to refill team.   1. Which medications need to be refilled? (please list name of each medication and dose if known) apixaban (ELIQUIS) 5 MG TABS tablet   2. Which pharmacy/location (including street and city if local pharmacy) is medication to be sent to? [place], [place]    3. Do they need a 30 day or 90 day supply? [place]

## 2022-07-16 ENCOUNTER — Other Ambulatory Visit: Payer: Self-pay

## 2022-07-16 DIAGNOSIS — I2721 Secondary pulmonary arterial hypertension: Secondary | ICD-10-CM | POA: Diagnosis not present

## 2022-07-16 DIAGNOSIS — Z79899 Other long term (current) drug therapy: Secondary | ICD-10-CM

## 2022-07-16 DIAGNOSIS — I5033 Acute on chronic diastolic (congestive) heart failure: Secondary | ICD-10-CM

## 2022-07-17 ENCOUNTER — Telehealth: Payer: Self-pay | Admitting: *Deleted

## 2022-07-17 LAB — BASIC METABOLIC PANEL
BUN/Creatinine Ratio: 12 (ref 12–28)
BUN: 15 mg/dL (ref 8–27)
CO2: 25 mmol/L (ref 20–29)
Calcium: 9.5 mg/dL (ref 8.7–10.3)
Chloride: 95 mmol/L — ABNORMAL LOW (ref 96–106)
Creatinine, Ser: 1.25 mg/dL — ABNORMAL HIGH (ref 0.57–1.00)
Glucose: 161 mg/dL — ABNORMAL HIGH (ref 70–99)
Potassium: 4.4 mmol/L (ref 3.5–5.2)
Sodium: 136 mmol/L (ref 134–144)
eGFR: 44 mL/min/{1.73_m2} — ABNORMAL LOW (ref >59)

## 2022-07-17 LAB — PRO B NATRIURETIC PEPTIDE: NT-Pro BNP: 2144 pg/mL — ABNORMAL HIGH (ref 0–738)

## 2022-07-17 LAB — MAGNESIUM: Magnesium: 2.2 mg/dL (ref 1.6–2.3)

## 2022-07-17 MED ORDER — DAPAGLIFLOZIN PROPANEDIOL 10 MG PO TABS: 10.0000 mg | ORAL_TABLET | Freq: Every day | ORAL | 6 refills | Status: DC

## 2022-07-17 NOTE — Telephone Encounter (Signed)
-----   Message from Sanda Klein, MD sent at 07/17/2022  9:02 AM EST ----- Mildly abnormal kidney function is at baseline.  Electrolytes are normal.  The proBNP is high consistent with heart failure, still with too much fluid on board. Please keep a log of weight and blood pressure and bring it when you come in to see Jory Sims next week.  Carefully avoid salt rich foods. Would like to start Farxiga 10 mg daily or Jardiance 10 mg daily for heart failure.  Can you please see if that is financially feasible?  Do we need patient assistance?

## 2022-07-17 NOTE — Telephone Encounter (Signed)
The patient has been notified of the result and verbalized understanding.  All questions (if any) were answered. Patient aware we will try Farxiga 10 mg - first to see if insurance will approve medication- if not will try Jardiance 10 mg  Patient verbalized understanding to bring weight and blood pressure log to appointment on 07/26/22 Raiford Simmonds, RN 07/17/2022 2:19 PM

## 2022-07-22 ENCOUNTER — Encounter: Payer: Self-pay | Admitting: Family Medicine

## 2022-07-22 ENCOUNTER — Other Ambulatory Visit: Payer: Self-pay | Admitting: *Deleted

## 2022-07-22 ENCOUNTER — Telehealth: Payer: Self-pay | Admitting: Cardiovascular Disease

## 2022-07-22 ENCOUNTER — Other Ambulatory Visit: Payer: Self-pay

## 2022-07-22 DIAGNOSIS — I272 Pulmonary hypertension, unspecified: Secondary | ICD-10-CM

## 2022-07-22 MED ORDER — SILDENAFIL CITRATE 20 MG PO TABS: 20.0000 mg | ORAL_TABLET | Freq: Three times a day (TID) | ORAL | 5 refills | Status: DC

## 2022-07-22 MED ORDER — PANTOPRAZOLE SODIUM 40 MG PO TBEC: 40.0000 mg | DELAYED_RELEASE_TABLET | Freq: Every day | ORAL | 3 refills | Status: DC

## 2022-07-22 NOTE — Telephone Encounter (Signed)
*  STAT* If patient is at the pharmacy, call can be transferred to refill team.   1. Which medications need to be refilled? (please list name of each medication and dose if known) sildenafil (REVATIO) 20 MG tablet   2. Which pharmacy/location (including street and city if local pharmacy) is medication to be sent to? [place], [place]   3. Do they need a 30 day or 90 day supply? [place]

## 2022-07-23 NOTE — Progress Notes (Signed)
Cardiology Clinic Note   Patient Name: Hailey [name] Date of Encounter: 07/26/2022  Primary Care Provider:  Wardell Honour, MD Primary Cardiologist:  Sanda Klein, MD  Patient Profile    77 y.o. female with longstanding persistent atrial fibrillation (s/p RF ablation procedures, now with longstanding persistent atrial fibrillation for over a year), s/p dual-chamber permanent pacemaker (St. Jude Accent 2012 for tachycardia-bradycardia syndrome and 2:1 atrioventricular block, generator change and new right ventricular lead for high pacing thresholds March 2021), chronic diastolic heart failure (history of acute decompensation on Multaq), multifactorial pulmonary artery hypertension, CAD (nonobstructive by cath February 2019), type 2 diabetes mellitus, hypertension, good lipid profile without therapy, hyperthyroidism (on propylthiouracil). She was to undergo a partial thyroidectomy involving the right thryoid lobe, through [facility] by  Dr. Inda Castle.   (Copied from Dr. Victorino December note 06/06/2022 for accuracy) She was evaluated in the rheumatology clinic by Dr. Amil Amen since she had an elevated ANA of 1: 640, but all her other serological markers were negative for lupus, rheumatoid arthritis or other connective tissue diseases. Chest CT has shown numerous bilateral pulmonary nodules felt to be nonspecific. These have been present back as far as 2012. She has a large goiter and is on chronic antithyroid medications.   Underwent RHC by Dr.Bensimhon on 07/09/2022 in the setting of longstanding pulmonary HTN revealing mixed moderate pulmonary venous and pulmonary arterial hypertension, with elevated filling pressures and low cardiac output. Recommendations to increased diuretics if tolerated. She was then increased on furosemide to 80 mg BID and spironolactone 25 mg daily. She was to have repeat BMET,BNP, and Mg in two weeks.   Past Medical History    Past Medical History:  Diagnosis Date    Acute on chronic diastolic CHF (congestive heart failure), NYHA class 1 (HCC) 04/29/2013   Allergic rhinitis    Atrial fibrillation (HCC)    Pacemaker   Brady-tachy syndrome (HCC)    s/p STJ dual chamber PPM by Dr Sallyanne Kuster   CAD (coronary artery disease)    70% RCA stenosis, treated medically   Control of atrial fibrillation with pacemaker ([place])    Diabetes mellitus ([place])    DJD (degenerative joint disease)    Dysrhythmia    Paroxysmal Atrial Fib.   Hypertension    Hyperthyroidism    Paroxysmal atrial fibrillation (HCC)    [place]    as a child   PONV (postoperative nausea and vomiting)    Presence of permanent cardiac pacemaker    Pulmonary disease    Past Surgical History:  Procedure Laterality Date   arm surgery d/t fx Right 83   ATRIAL FIBRILLATION ABLATION  07/31/2012; 08-10-13   PVI by Dr Rayann Heman   ATRIAL FIBRILLATION ABLATION N/A 07/31/2012   Procedure: ATRIAL FIBRILLATION ABLATION;  Surgeon: Thompson Grayer, MD;  Location: [facility] CATH LAB;  Service: Cardiovascular;  Laterality: N/A;   ATRIAL FIBRILLATION ABLATION N/A 08/10/2013   Procedure: ATRIAL FIBRILLATION ABLATION;  Surgeon: Coralyn Mark, MD;  Location: [place] CATH LAB;  Service: Cardiovascular;  Laterality: N/A;   BACK SURGERY  07/26/2012   dR. [facility]   CARDIAC CATHETERIZATION  12/03/2010   single vessel mid RCA   CARDIAC ELECTROPHYSIOLOGY MAPPING AND ABLATION  07/31/2012   Dr. Thompson Grayer   CESAREAN SECTION  09/20/1974   Dr. Gertie Fey   CHOLECYSTECTOMY  1994   COLONOSCOPY WITH PROPOFOL N/A 03/21/2015   Procedure: COLONOSCOPY WITH PROPOFOL;  Surgeon: Juanita Craver, MD;  Location: [facility] ENDOSCOPY;  Service: Endoscopy;  Laterality: N/A;  DIAGNOSTIC MAMMOGRAM  04/16/2017   Hedwig Morton. Morris, DO   LAPAROSCOPIC HYSTERECTOMY  Late 59s to early 78s   LEFT HEART CATH AND CORONARY ANGIOGRAPHY N/A 07/30/2017   Procedure: LEFT HEART CATH AND CORONARY ANGIOGRAPHY;  Surgeon: Troy Sine, MD;  Location: [place] CV LAB;  Service:  Cardiovascular;  Laterality: N/A;   LUMBAR LAMINECTOMY/DECOMPRESSION MICRODISCECTOMY Right 12/23/2012   Procedure: Right L5-S1 Laminotomy for resection of synovial cyst;  Surgeon: Hosie Spangle, MD;  Location: [place] NEURO ORS;  Service: Neurosurgery;  Laterality: Right;  Right Lumbar five-sacral one Laminotomy for resection of synovial cyst   NM MYOCAR PERF WALL MOTION  12/20/2010   normal   PACEMAKER IMPLANT N/A 08/09/2019   Procedure: PACEMAKER IMPLANT;  Surgeon: Sanda Klein, MD;  Location: [place] CV LAB;  Service: Cardiovascular;  Laterality: N/A;   PACEMAKER INSERTION  12/03/10   SJM Accent DR RF implanted by Dr Sallyanne Kuster   RIGHT HEART CATH N/A 04/22/2018   Procedure: RIGHT HEART CATH;  Surgeon: Wellington Hampshire, MD;  Location: [place] CV LAB;  Service: Cardiovascular;  Laterality: N/A;   RIGHT HEART CATH N/A 07/09/2022   Procedure: RIGHT HEART CATH;  Surgeon: Jolaine Artist, MD;  Location: [place] CV LAB;  Service: Cardiovascular;  Laterality: N/A;   TEE WITHOUT CARDIOVERSION N/A 07/30/2012   Procedure: TRANSESOPHAGEAL ECHOCARDIOGRAM (TEE);  Surgeon: Sanda Klein, MD;  Location: [facility] ENDOSCOPY;  Service: Cardiovascular;  Laterality: N/A;  h&p in file-hope   TEE WITHOUT CARDIOVERSION N/A 08/09/2013   Procedure: TRANSESOPHAGEAL ECHOCARDIOGRAM (TEE);  Surgeon: Lelon Perla, MD;  Location: [facility] ENDOSCOPY;  Service: Cardiovascular;  Laterality: N/A;   TOTAL ABDOMINAL HYSTERECTOMY  1981   ULTRASOUND GUIDANCE FOR VASCULAR ACCESS  04/22/2018   Procedure: Ultrasound Guidance For Vascular Access;  Surgeon: Wellington Hampshire, MD;  Location: [place] CV LAB;  Service: Cardiovascular;;    Allergies  Allergies  Allergen Reactions   Codeine Nausea And Vomiting and Nausea Only   Demerol Hcl [Meperidine] Nausea Only   Ketorolac Nausea And Vomiting, Nausea Only and Other (See Comments)    "pt told to never take this med"   Demerol Nausea And Vomiting   Multaq [Dronedarone] Other  (See Comments)    Increased fluid retention   Flexeril [Cyclobenzaprine] Nausea And Vomiting    History of Present Illness    Mrs. Rivenburg comes today for ongoing assessment and management of multiple cardiac issues to include non-obstructive CAD, PPM in situ, longstanding atrial fibrillation, HTN, and HL, primarily pulmonary HTN.    She had RHC on 07/09/2022 revealing moderate venous and arterial pulmonary HTN, with increased doses of furosemide to 80 mg BID, addition of spironolactone 25 mg daily, with follow BNP on 07/15/2022 elevated at 2,144.Creatinine 1.25, GFR 44, Mg 2.2.   Farxiga 10 mg was added by Dr.Croitoru on 07/17/2022. Daily weights recording and BP recording were requested and low sodium diet were recommended. She is due to have thyroid surgery and will need to be optimized on her medications and euvolemic prior to clearance.   Keana states that she is feeling very well today.  She has tolerated the [place] without any issues.  She has lost 8 more pounds since being placed on the medication.  She no longer feels full in her abdomen, as this is where she reports that she feels fluid buildup.  Her breathing has improved somewhat.  Her son, who is with her, states that she continues to do too much and get  tired.  They have asked her to cut back on doing so much housework.  She is also caring for her husband with Parkinson's and gets up during the night with him to assist him to the bathroom.  She is without complaints of severe shortness of breath, she states that she is beginning to have more energy and help she becomes tired after the activity.  She is not having any trouble swallowing due to the thyroid enlargement.  She denies any chest discomfort.  No issues with bleeding on Eliquis.  Home Medications    Current Outpatient Medications  Medication Sig Dispense Refill   acetaminophen (TYLENOL) 500 MG tablet Take 500-1,000 mg by mouth every 6 (six) hours as needed (pain.).      benzonatate (TESSALON PERLES) 100 MG capsule Take 1 capsule (100 mg total) by mouth 3 (three) times daily. 21 capsule 0   Cholecalciferol (VITAMIN D3) 1000 UNITS CAPS Take 1,000 Units by mouth in the morning.     Cyanocobalamin (VITAMIN B12 PO) Take 1 tablet by mouth 2 (two) times a week.     dapagliflozin propanediol (FARXIGA) 10 MG TABS tablet Take 1 tablet (10 mg total) by mouth daily before breakfast. 90 tablet 6   ELIQUIS 5 MG TABS tablet TAKE ONE TABLET BY MOUTH TWICE DAILY 60 tablet 11   furosemide (LASIX) 80 MG tablet Take 1 tablet (80 mg total) by mouth 2 (two) times daily. 135 tablet 1   glucose blood (ONETOUCH ULTRA) test strip USE DAILY TO CHECK BLOOD SUGAR E11.8 100 each 11   Lancets Misc. (ONE TOUCH SURESOFT) MISC Use to test  Blood sugar once daily DX: E11.8, 1 each 0   loperamide (IMODIUM) 2 MG capsule Take 2 mg by mouth as needed for diarrhea or loose stools.      methimazole (TAPAZOLE) 10 MG tablet TAKE 1 TABLET BY MOUTH ONCE DAILY WITH FOOD FOR THYROID 90 tablet 1   metoprolol tartrate (LOPRESSOR) 50 MG tablet TAKE 1 TABLET BY MOUTH ONCE DAILY IN THE MORNING THEN TAKE 1/2 (ONE-HALF) TABLET ONCE DAILY IN THE EVENING 135 tablet 2   ondansetron (ZOFRAN) 4 MG tablet Take 1 tablet (4 mg total) by mouth every 8 (eight) hours as needed for nausea or vomiting. 20 tablet 0   oxybutynin (DITROPAN) 5 MG tablet Take 1 tablet by mouth once daily 90 tablet 0   pantoprazole (PROTONIX) 40 MG tablet Take 1 tablet (40 mg total) by mouth daily. 90 tablet 3   potassium chloride SA (KLOR-CON M) 20 MEQ tablet Take 1 tablet (20 mEq total) by mouth 2 (two) times daily. 180 tablet 1   sildenafil (REVATIO) 20 MG tablet Take 1 tablet (20 mg total) by mouth 3 (three) times daily. 90 tablet 5   sitaGLIPtin (JANUVIA) 100 MG tablet Take 1 tablet by mouth once daily 90 tablet 1   spironolactone (ALDACTONE) 25 MG tablet Take 1 tablet (25 mg total) by mouth daily. 90 tablet 3   No current facility-administered  medications for this visit.     Family History    Family History  Problem Relation Age of Onset   Cancer Brother    Heart attack Sister    Breast cancer Sister    Diabetes Sister    Heart disease Sister    Breast cancer Sister    Dementia Sister    Colon cancer Sister    Heart disease Sister    Breast cancer Sister    Suicidality Brother  Prostate cancer Brother    Dementia Brother    She indicated that her mother is deceased. She indicated that her father is deceased. She indicated that three of her seven sisters are alive. She indicated that only one of her three brothers is alive. She indicated that her maternal grandmother is deceased. She indicated that her maternal grandfather is deceased. She indicated that her paternal grandmother is deceased. She indicated that her paternal grandfather is deceased. She indicated that all of her three children are alive.  Social History    Social History   Socioeconomic History   Marital status: Married    Spouse name: [name]   Number of children: 3   Years of education: 13   Highest education level: Some college, no degree  Occupational History   Occupation: housewife  Tobacco Use   Smoking status: Never   Smokeless tobacco: Never  Vaping Use   Vaping Use: Never used  Substance and Sexual Activity   Alcohol use: No   Drug use: No   Sexual activity: Not Currently    Birth control/protection: Abstinence  Other Topics Concern   Not on file  Social History Narrative   Lives in [place].  Retired.  Was office clerk in several companies.  Did some technical school after completing high school.  She has no pets.  She and her husband live in a one-story home.  They've been married since 1965.  She follows a heart healthy diet.  She has a living will and hcpoa but does not have a DNR form.  She does struggle to prepare food.     Social Determinants of Health   Financial Resource Strain: Not on file  Food Insecurity: Not on  file  Transportation Needs: Not on file  Physical Activity: Not on file  Stress: Not on file  Social Connections: Not on file  Intimate Partner Violence: Not on file     Review of Systems    General:  No chills, fever, night sweats or weight changes.  Cardiovascular:  No chest pain, dyspnea on exertion, edema, orthopnea, no palpitations, paroxysmal nocturnal dyspnea. Dermatological: No rash, lesions/masses Respiratory: No cough, mild dyspnea with exertion. Urologic: No hematuria, dysuria Abdominal:   No nausea, vomiting, diarrhea, bright red blood per rectum, melena, or hematemesis Neurologic:  No visual changes, wkns, changes in mental status. All other systems reviewed and are otherwise negative except as noted above.     Physical Exam    VS:  BP 130/68   Pulse 70   Ht '4\' 11"'$  (1.499 m)   Wt 123 lb 9.6 oz (56.1 kg)   SpO2 97%   BMI 24.96 kg/m  , BMI Body mass index is 24.96 kg/m.     GEN: Well nourished, well developed, in no acute distress. HEENT: WNL Neck: Supple, no JVD, carotid bruits, Thyroid enlargement is noted more prominent on the right. Cardiac: IRRR, no murmurs, rubs, or gallops. No clubbing, cyanosis, edema.  Radials/DP/PT 2+ and equal bilaterally.  Respiratory:  Respirations regular and unlabored, clear to auscultation bilaterally. GI: Soft, nontender, nondistended, BS + x 4. MS: no deformity or atrophy. Skin: warm and dry, no rash. Neuro:  Strength and sensation are intact. Psych: Normal affect.  Accessory Clinical Findings    ECG personally reviewed by me today-V pacing with underlying atrial fibrillation, ventricular rate 80 bpm,- No acute changes  Lab Results  Component Value Date   WBC 5.9 07/09/2022   HGB 11.9 (L) 07/09/2022   HGB  12.2 07/09/2022   HCT 35.0 (L) 07/09/2022   HCT 36.0 07/09/2022   MCV 90.0 07/09/2022   PLT 236 07/09/2022   Lab Results  Component Value Date   CREATININE 1.25 (H) 07/16/2022   BUN 15 07/16/2022   NA 136  07/16/2022   K 4.4 07/16/2022   CL 95 (L) 07/16/2022   CO2 25 07/16/2022   Lab Results  Component Value Date   ALT 9 11/13/2020   AST 15 11/13/2020   ALKPHOS 81 04/19/2018   BILITOT 1.6 (H) 11/13/2020   Lab Results  Component Value Date   CHOL 125 11/13/2020   HDL 50 11/13/2020   LDLCALC 60 11/13/2020   TRIG 70 11/13/2020   CHOLHDL 2.5 11/13/2020    Lab Results  Component Value Date   HGBA1C 6.2 (H) 11/20/2021    Review of Prior Studies:  Right Heart Catheterization 07/09/2022 RA = 12 RV = 76/11 PA = 73/27 (41) PCW = 23 Fick cardiac output/index = 3.2/2.1 PVR = 5.6 WU Ao sat = 96% PA sat = 56%, 58% PAPi = 3.8   Assessment: 1. Mixed moderate pulmonary venous and pulmonary arterial HTN with elevated filling pressures and low cardiac output  Echocardiogram 07/01/2022  1. Evidence for moderate pulmonary HTN Estimated PAs pressure 62 mmHg.   2. Left ventricular ejection fraction, by estimation, is 60 to 65%. The  left ventricle has normal function. The left ventricle has no regional  wall motion abnormalities. Left ventricular diastolic parameters were  normal.   3. Catheter in RA/RV . Right ventricular systolic function is moderately  reduced. The right ventricular size is normal. There is moderately  elevated pulmonary artery systolic pressure.   4. Left atrial size was mildly dilated.   5. Right atrial size was moderately dilated.   6. The mitral valve is abnormal. No evidence of mitral valve  regurgitation. No evidence of mitral stenosis.   7. Tricuspid valve regurgitation is severe.   8. The aortic valve is tricuspid. Aortic valve regurgitation is trivial.  No aortic stenosis is present.   9. The inferior vena cava is dilated in size with <50% respiratory  variability, suggesting right atrial pressure of 15 mmHg.    Assessment & Plan   1.  Chronic diastolic heart failure: Has done remarkably well with addition of Farxiga 10 mg daily, with furosemide 80 mg  twice daily and spironolactone 25 mg daily with an 8 pound weight loss.  Breathing status has improved somewhat.  Still gets tired but family states she is doing too much.  She states she is likes to stay busy and not sit too much.  She states the fullness in her abdomen has been eliminated, and she offers no complaints of lower extremity edema or heaviness.  She wonders if she can continue to take the oxybutynin with so much diuresis from the medications.  She states that it is helping her and therefore, I have suggested that she continue it.  I am going to repeat a CMET, and a BNP next week.  Have her follow-up in a total of 1 month to be seen.  Uncertain what dry weight might be, possibly between 121 to 123 pounds.  Will check labs.  2. Pulmonary HTN: Right heart cath as above. Continue current medication regimen.,  3.  Permanent atrial fibrillation: Heart rate is well-controlled on metoprolol 50 mg in the morning and 25 mg in the evening, she is tolerating Eliquis 5 mg twice daily.  She denies any  racing heart rate or palpitations.  EKG reveals ventricular pacing with underlying atrial fib.  4.  Hypertension: Well-controlled on this office visit.  5.  Thyroid Goiter: Awaiting cardiology clearance to proceed with surgery.  She appears to be progressing nicely and is likely euvolemic but will be checking labs to evaluate her status.  Will see her again to clear her for surgery once all information is available.  Current medicines are reviewed at length with the patient today.  I have spent 30 min's  dedicated to the care of this patient on the date of this encounter to include pre-visit review of records, assessment, management and diagnostic testing,with shared decision making.  Signed, Phill Myron. West Pugh, ANP, [place]   07/26/2022 12:02 PM      Office [phone] Fax [phone]  Notice: This dictation was prepared with Dragon dictation along with smaller phrase technology. Any  transcriptional errors that result from this process are unintentional and may not be corrected upon review.

## 2022-07-26 ENCOUNTER — Encounter: Payer: Self-pay | Admitting: Adult Health

## 2022-07-26 ENCOUNTER — Ambulatory Visit: Payer: Medicare Other | Attending: Adult Health | Admitting: Adult Health

## 2022-07-26 VITALS — BP 130/68 | HR 70 | Ht 59.0 in | Wt 123.6 lb

## 2022-07-26 DIAGNOSIS — I2510 Atherosclerotic heart disease of native coronary artery without angina pectoris: Secondary | ICD-10-CM

## 2022-07-26 DIAGNOSIS — I5032 Chronic diastolic (congestive) heart failure: Secondary | ICD-10-CM

## 2022-07-26 DIAGNOSIS — I2721 Secondary pulmonary arterial hypertension: Secondary | ICD-10-CM | POA: Diagnosis not present

## 2022-07-26 DIAGNOSIS — Z950 Presence of cardiac pacemaker: Secondary | ICD-10-CM | POA: Insufficient documentation

## 2022-07-26 DIAGNOSIS — I272 Pulmonary hypertension, unspecified: Secondary | ICD-10-CM | POA: Diagnosis not present

## 2022-07-26 DIAGNOSIS — E042 Nontoxic multinodular goiter: Secondary | ICD-10-CM | POA: Insufficient documentation

## 2022-07-26 DIAGNOSIS — I4821 Permanent atrial fibrillation: Secondary | ICD-10-CM | POA: Diagnosis not present

## 2022-07-26 DIAGNOSIS — Z79899 Other long term (current) drug therapy: Secondary | ICD-10-CM

## 2022-07-26 DIAGNOSIS — I495 Sick sinus syndrome: Secondary | ICD-10-CM | POA: Insufficient documentation

## 2022-07-26 MED ORDER — SPIRONOLACTONE 25 MG PO TABS: 25.0000 mg | ORAL_TABLET | Freq: Every day | ORAL | 3 refills | Status: DC

## 2022-07-26 NOTE — Patient Instructions (Addendum)
Medication Instructions:  No Changes *If you need a refill on your cardiac medications before your next appointment, please call your pharmacy*   Lab Work: BNP, CBC,CMP, Magnesium Level If you have labs (blood work) drawn today and your tests are completely normal, you will receive your results only by: [place] (if you have MyChart) OR A paper copy in the mail If you have any lab test that is abnormal or we need to change your treatment, we will call you to review the results.   Testing/Procedures: No Testing   Follow-Up: At [facility], you and your health needs are our priority.  As part of our continuing mission to provide you with exceptional heart care, we have created designated Provider Care Teams.  These Care Teams include your primary Cardiologist (physician) and Advanced Practice Providers (APPs -  Physician Assistants and Nurse Practitioners) who all work together to provide you with the care you need, when you need it.  We recommend signing up for the patient portal called "MyChart".  Sign up information is provided on this After Visit Summary.  MyChart is used to connect with patients for Virtual Visits (Telemedicine).  Patients are able to view lab/test results, encounter notes, upcoming appointments, etc.  Non-urgent messages can be sent to your provider as well.   To learn more about what you can do with MyChart, go to [email or web address].    Your next appointment:   1 month(s)  Provider:   Jory Sims, DNP, ANP

## 2022-07-29 NOTE — Addendum Note (Signed)
Addended by: Merri Ray A on: 07/29/2022 10:25 AM   Modules accepted: Orders

## 2022-08-01 DIAGNOSIS — I5020 Unspecified systolic (congestive) heart failure: Secondary | ICD-10-CM | POA: Diagnosis not present

## 2022-08-01 DIAGNOSIS — Z79899 Other long term (current) drug therapy: Secondary | ICD-10-CM | POA: Diagnosis not present

## 2022-08-01 DIAGNOSIS — I272 Pulmonary hypertension, unspecified: Secondary | ICD-10-CM | POA: Diagnosis not present

## 2022-08-02 ENCOUNTER — Ambulatory Visit: Payer: Self-pay | Admitting: Adult Health

## 2022-08-02 ENCOUNTER — Encounter: Payer: Self-pay | Admitting: Adult Health

## 2022-08-02 ENCOUNTER — Telehealth: Payer: Self-pay | Admitting: Adult Health

## 2022-08-02 DIAGNOSIS — E042 Nontoxic multinodular goiter: Secondary | ICD-10-CM

## 2022-08-02 DIAGNOSIS — I5032 Chronic diastolic (congestive) heart failure: Secondary | ICD-10-CM

## 2022-08-02 DIAGNOSIS — Z515 Encounter for palliative care: Secondary | ICD-10-CM | POA: Diagnosis not present

## 2022-08-02 LAB — COMPREHENSIVE METABOLIC PANEL
ALT: 8 IU/L (ref 0–32)
AST: 17 IU/L (ref 0–40)
Albumin/Globulin Ratio: 1.9 (ref 1.2–2.2)
Albumin: 4.4 g/dL (ref 3.8–4.8)
Alkaline Phosphatase: 123 IU/L — ABNORMAL HIGH (ref 44–121)
BUN/Creatinine Ratio: 11 — ABNORMAL LOW (ref 12–28)
BUN: 15 mg/dL (ref 8–27)
Bilirubin Total: 1.2 mg/dL (ref 0.0–1.2)
CO2: 22 mmol/L (ref 20–29)
Calcium: 9.5 mg/dL (ref 8.7–10.3)
Chloride: 98 mmol/L (ref 96–106)
Creatinine, Ser: 1.37 mg/dL — ABNORMAL HIGH (ref 0.57–1.00)
Globulin, Total: 2.3 g/dL (ref 1.5–4.5)
Glucose: 139 mg/dL — ABNORMAL HIGH (ref 70–99)
Potassium: 4.5 mmol/L (ref 3.5–5.2)
Sodium: 136 mmol/L (ref 134–144)
Total Protein: 6.7 g/dL (ref 6.0–8.5)
eGFR: 40 mL/min/{1.73_m2} — ABNORMAL LOW (ref >59)

## 2022-08-02 LAB — CBC
Hematocrit: 38.5 % (ref 34.0–46.6)
Hemoglobin: 12.2 g/dL (ref 11.1–15.9)
MCH: 27.6 pg (ref 26.6–33.0)
MCHC: 31.7 g/dL (ref 31.5–35.7)
MCV: 87 fL (ref 79–97)
Platelets: 279 10*3/uL (ref 150–450)
RBC: 4.42 x10E6/uL (ref 3.77–5.28)
RDW: 13.3 % (ref 11.7–15.4)
WBC: 7.9 10*3/uL (ref 3.4–10.8)

## 2022-08-02 LAB — MAGNESIUM: Magnesium: 2.3 mg/dL (ref 1.6–2.3)

## 2022-08-02 LAB — BRAIN NATRIURETIC PEPTIDE: BNP: 172.8 pg/mL — ABNORMAL HIGH (ref 0.0–100.0)

## 2022-08-02 NOTE — Progress Notes (Signed)
   Primary Cardiologist: Sanda Klein, MD  Chart reviewed as part of pre-operative protocol coverage. Given past medical history and time since last visit, based on ACC/AHA guidelines, [place] would be at acceptable risk for the planned procedure without further cardiovascular testing.   Hailey [name] is on Eliquis 5 mg twice daily and will need to be cleared by pharmacy concerning stopping this medication once a date has been scheduled for her thyroidectomy/removal or of thyroid goiter.  I have reviewed the labs, and spoken with Dr. Sallyanne Kuster who has authorized the patient to move forward with surgery.  She will need to have a surgery date in order for [place] to provide recommendations considering Eliquis discontinuation perioperatively.  She is not on an SGLT 1 inhibitor.    Phill Myron. West Pugh, ANP, AACC  08/02/2022, 4:37 PM

## 2022-08-02 NOTE — Telephone Encounter (Signed)
Called Hailey [name] and left a message on her [id number] to inform her that her lab values were normal and that she can proceed with her thyroid surgery.  A note was also written concerning preoperative clearance with the caveat of having Eliquis cessation held under the direction of pharmacy.  I also left a telephone message with her daughter Hailey [name] with authorization to leave these messages to inform her to contact her mother to let her know that she is okay to move forward with surgery however that the surgeon will need to request a preoperative clearance so that we can make recommendations concerning Eliquis cessation perioperatively.  There is documentation in a previous encounter concerning this request and preoperative clearance.  Jory Sims

## 2022-08-05 ENCOUNTER — Telehealth: Payer: Self-pay | Admitting: Adult Health

## 2022-08-05 NOTE — Telephone Encounter (Signed)
Spoke with pt regarding cardiac clearance for thyroid surgery. Per Curt Bears Lawrence's note we just need clearance to be sent over from Dr. Dorathy Kinsman office. Fax number provided. Pt will give their office a call and have them send one over.

## 2022-08-05 NOTE — Telephone Encounter (Signed)
Pt calling to f/u about thyroid surgery discussed during phone call about recent lab work. Please advise.

## 2022-08-05 NOTE — Telephone Encounter (Signed)
Patient is returning call. Call transferred to RN.

## 2022-08-05 NOTE — Telephone Encounter (Signed)
Left message for pt to call back  °

## 2022-08-07 ENCOUNTER — Telehealth: Payer: Self-pay

## 2022-08-07 ENCOUNTER — Ambulatory Visit: Payer: Medicare Other

## 2022-08-07 DIAGNOSIS — I495 Sick sinus syndrome: Secondary | ICD-10-CM

## 2022-08-07 LAB — CUP PACEART REMOTE DEVICE CHECK
Battery Remaining Longevity: 111 mo
Battery Remaining Percentage: 77 %
Battery Voltage: 3.01 V
Brady Statistic RV Percent Paced: 96 %
Date Time Interrogation Session: 20240306040013
Implantable Lead Connection Status: 753985
Implantable Lead Connection Status: 753985
Implantable Lead Connection Status: 753985
Implantable Lead Implant Date: 20120702
Implantable Lead Implant Date: 20120702
Implantable Lead Implant Date: 20210308
Implantable Lead Location: 753859
Implantable Lead Location: 753860
Implantable Lead Location: 753860
Implantable Pulse Generator Implant Date: 20210308
Lead Channel Impedance Value: 660 Ohm
Lead Channel Pacing Threshold Amplitude: 0.5 V
Lead Channel Pacing Threshold Pulse Width: 0.5 ms
Lead Channel Sensing Intrinsic Amplitude: 11.8 mV
Lead Channel Setting Pacing Amplitude: 0.75 V
Lead Channel Setting Pacing Pulse Width: 0.5 ms
Lead Channel Setting Sensing Sensitivity: 2 mV
Pulse Gen Model: 2272
Pulse Gen Serial Number: 3801383

## 2022-08-07 NOTE — Telephone Encounter (Addendum)
Called patient regarding results. Patient had understanding of results.----- Message from Lendon Colonel, NP sent at 08/02/2022  7:24 AM EST ----- Labs reviewed., Slight bump in the creatinine expected with use of [place]. Continue to follow labs every 3 months. No changes.  Awating the rest of the labs to make a decision on surgical clearance

## 2022-08-07 NOTE — Telephone Encounter (Addendum)
Called patient regarding results. Patient had understanding of results.----- Message from Lendon Colonel, NP sent at 08/02/2022  4:09 PM EST ----- Labs have been reviewed. BNP is decreased indicating reduction in fluid from heart failure. Creatinine and Alkaline phos, is slightly elevated as expected with the [place]. Will monitor this in 3 months, Magnesium is normal. Overall results are not concerning, Follow up labs in 3 months, Will discuss with Dr. Loletha Grayer about moving forward with thyroid surgery.

## 2022-08-14 NOTE — Telephone Encounter (Signed)
Can preop team advise on Eliquis?   See message from K.Purcell Nails, NP:   Lendon Colonel, NP     08/02/22  4:50 PM Note Called [name] and left a message on her [phone] to inform her that her lab values were normal and that she can proceed with her thyroid surgery.  A note was also written concerning preoperative clearance with the caveat of having Eliquis cessation held under the direction of pharmacy.   I also left a telephone message with her daughter Denny Peon with authorization to leave these messages to inform her to contact her mother to let her know that she is okay to move forward with surgery however that the surgeon will need to request a preoperative clearance so that we can make recommendations concerning Eliquis cessation perioperatively.  There is documentation in a previous encounter concerning this request and preoperative clearance.   Hailey [name]

## 2022-08-14 NOTE — Telephone Encounter (Signed)
Pharmacy please advise on holding Eliquis prior to total thyroidectomy scheduled for TBD. Thank you.   

## 2022-08-14 NOTE — Telephone Encounter (Signed)
   Patient Name: Hailey [name]  DOB: 1945-10-29 MRN: [id number]  Primary Cardiologist: Sanda Klein, MD  Clinical pharmacists have reviewed the patient's past medical history, labs, and current medications as part of preoperative protocol coverage. The following recommendations have been made:  Per office protocol, patient can hold Eliquis for 2-3 days prior to procedure.      I will route this recommendation to the requesting party via Epic fax function and remove from pre-op pool.  Please call with questions.  Mable Fill, [name], NP 08/14/2022, 2:45 PM

## 2022-08-14 NOTE — Telephone Encounter (Signed)
Patient with diagnosis of afib on Eliquis for anticoagulation.    Procedure: total thyroidectomy Date of procedure: TBD  CHA2DS2-VASc Score = 7  This indicates a 11.2% annual risk of stroke. The patient's score is based upon: CHF History: 1 HTN History: 1 Diabetes History: 1 Stroke History: 0 Vascular Disease History: 1 Age Score: 2 Gender Score: 1   CrCl 70m/min Platelet count 279K  Per office protocol, patient can hold Eliquis for 2-3 days prior to procedure.    **This guidance is not considered finalized until pre-operative APP has relayed final recommendations.**

## 2022-08-14 NOTE — Telephone Encounter (Signed)
   Name: Hailey [name]  DOB: 1945-11-12  MRN: [id number]  Primary Cardiologist: Sanda Klein, MD  Chart reviewed as part of pre-operative protocol coverage. The patient has an upcoming visit scheduled with Jory Sims, DNP on 08/30/22 at which time clearance can be addressed in case there are any issues that would impact surgical recommendations.  I added preop FYI to appointment note so that provider is aware to address at time of outpatient visit.  Per office protocol the cardiology provider should forward their finalized clearance decision and recommendations regarding antiplatelet therapy to the requesting party below.    This message will also be routed to pharmacy pool   as requested below so that this information is available to the clearing provider at time of patient's appointment.   I will route this message as FYI to requesting party and remove this message from the preop box as separate preop APP input not needed at this time.   Please call with any questions.  Mable Fill, [name], NP  08/14/2022, 8:56 AM

## 2022-08-14 NOTE — Telephone Encounter (Signed)
Angie called to follow up on the status of the patient's clearance. Angie stated that there is a new fax number, please fax the clearance to [phone].

## 2022-08-16 ENCOUNTER — Telehealth: Payer: Self-pay | Admitting: Cardiovascular Disease

## 2022-08-16 NOTE — Telephone Encounter (Signed)
Called pt back- no answer, left message and call back number

## 2022-08-16 NOTE — Telephone Encounter (Signed)
Patient states she was returning call. Please advise  ?

## 2022-08-19 ENCOUNTER — Telehealth: Payer: Self-pay | Admitting: Cardiovascular Disease

## 2022-08-19 NOTE — Telephone Encounter (Signed)
*  STAT* If patient is at the pharmacy, call can be transferred to refill team.   1. Which medications need to be refilled? (please list name of each medication and dose if known) metoprolol tartrate (LOPRESSOR) 50 MG tablet   2. Which pharmacy/location (including street and city if local pharmacy) is medication to be sent to?  [place], [place]    3. Do they need a 30 day or 90 day supply? [place]

## 2022-08-20 MED ORDER — METOPROLOL TARTRATE 50 MG PO TABS: ORAL_TABLET | ORAL | 2 refills | Status: DC

## 2022-08-23 ENCOUNTER — Telehealth: Payer: Self-pay | Admitting: Cardiovascular Disease

## 2022-08-23 NOTE — Telephone Encounter (Signed)
Spoke with pt and is coming next Friday 3/29 for appt see other phone note ./cy

## 2022-08-23 NOTE — Telephone Encounter (Signed)
Patient states she is returning a call but there is no notes on the chart. Please advise

## 2022-08-23 NOTE — Telephone Encounter (Signed)
Pt aware of of lab results and will discuss at 3/29 office visit if may proceed with thyroid surgery ./cy

## 2022-08-26 ENCOUNTER — Other Ambulatory Visit: Payer: Self-pay

## 2022-08-26 DIAGNOSIS — N3281 Overactive bladder: Secondary | ICD-10-CM

## 2022-08-26 MED ORDER — OXYBUTYNIN CHLORIDE 5 MG PO TABS: 5.0000 mg | ORAL_TABLET | Freq: Every day | ORAL | 0 refills | Status: DC

## 2022-08-28 NOTE — Progress Notes (Signed)
Cardiology Clinic Note   Patient Name: Hailey [name] Date of Encounter: 08/30/2022  Primary Care Provider:  Wardell Honour, MD Primary Cardiologist:  Hailey Klein, MD  Patient Profile    77 y.o. female with longstanding persistent atrial fibrillation (s/p RF ablation procedures, now with longstanding persistent atrial fibrillation for over a year), s/p dual-chamber permanent pacemaker ([employer] Accent 2012 for tachycardia-bradycardia syndrome and 2:1 atrioventricular block, generator change and new right ventricular lead for high pacing thresholds March 2021), chronic diastolic heart failure (history of acute decompensation on Multaq), multifactorial pulmonary artery hypertension, CAD (nonobstructive by cath February 2019), type 2 diabetes mellitus, hypertension, good lipid profile without therapy, hyperthyroidism (on propylthiouracil.  Plans for surgery have been approved, will need to have Eliquis addressed by pharmacy.  Past Medical History    Past Medical History:  Diagnosis Date   Acute on chronic diastolic CHF (congestive heart failure), NYHA class 1 (HCC) 04/29/2013   Allergic rhinitis    Atrial fibrillation (HCC)    Pacemaker   Brady-tachy syndrome (HCC)    s/p STJ dual chamber PPM by Dr Hailey [name]   CAD (coronary artery disease)    70% RCA stenosis, treated medically   Control of atrial fibrillation with pacemaker ([place])    Diabetes mellitus ([place])    DJD (degenerative joint disease)    Dysrhythmia    Paroxysmal Atrial Fib.   Hypertension    Hyperthyroidism    Paroxysmal atrial fibrillation (HCC)    [place]    as a child   PONV (postoperative nausea and vomiting)    Presence of permanent cardiac pacemaker    Pulmonary disease    Past Surgical History:  Procedure Laterality Date   arm surgery d/t fx Right 11   ATRIAL FIBRILLATION ABLATION  07/31/2012; 08-10-13   PVI by Dr Hailey [name]   ATRIAL FIBRILLATION ABLATION N/A 07/31/2012   Procedure: ATRIAL FIBRILLATION ABLATION;   Surgeon: Hailey Grayer, MD;  Location: [facility] CATH LAB;  Service: Cardiovascular;  Laterality: N/A;   ATRIAL FIBRILLATION ABLATION N/A 08/10/2013   Procedure: ATRIAL FIBRILLATION ABLATION;  Surgeon: Hailey Mark, MD;  Location: [place] CATH LAB;  Service: Cardiovascular;  Laterality: N/A;   BACK SURGERY  07/26/2012   [facility]   CARDIAC CATHETERIZATION  12/03/2010   single vessel mid RCA   CARDIAC ELECTROPHYSIOLOGY MAPPING AND ABLATION  07/31/2012   Dr. Thompson [name]   CESAREAN SECTION  09/20/1974   Dr. Gertie [name]   CHOLECYSTECTOMY  1994   COLONOSCOPY WITH PROPOFOL N/A 03/21/2015   Procedure: COLONOSCOPY WITH PROPOFOL;  Surgeon: Hailey Craver, MD;  Location: [facility] ENDOSCOPY;  Service: Endoscopy;  Laterality: N/A;   DIAGNOSTIC MAMMOGRAM  04/16/2017   Hailey Morton. Morris, DO   LAPAROSCOPIC HYSTERECTOMY  Late 46s to early 69s   LEFT HEART CATH AND CORONARY ANGIOGRAPHY N/A 07/30/2017   Procedure: LEFT HEART CATH AND CORONARY ANGIOGRAPHY;  Surgeon: Hailey Sine, MD;  Location: [place] CV LAB;  Service: Cardiovascular;  Laterality: N/A;   LUMBAR LAMINECTOMY/DECOMPRESSION MICRODISCECTOMY Right 12/23/2012   Procedure: Right L5-S1 Laminotomy for resection of synovial cyst;  Surgeon: Hailey Spangle, MD;  Location: [place] NEURO ORS;  Service: Neurosurgery;  Laterality: Right;  Right Lumbar five-sacral one Laminotomy for resection of synovial cyst   NM MYOCAR PERF WALL MOTION  12/20/2010   normal   PACEMAKER IMPLANT N/A 08/09/2019   Procedure: PACEMAKER IMPLANT;  Surgeon: Hailey Klein, MD;  Location: [place] CV LAB;  Service: Cardiovascular;  Laterality: N/A;   PACEMAKER  INSERTION  12/03/10   SJM Accent DR RF implanted by Dr Hailey [name]   RIGHT HEART CATH N/A 04/22/2018   Procedure: RIGHT HEART CATH;  Surgeon: Hailey Hampshire, MD;  Location: [place] CV LAB;  Service: Cardiovascular;  Laterality: N/A;   RIGHT HEART CATH N/A 07/09/2022   Procedure: RIGHT HEART CATH;  Surgeon: Hailey Artist, MD;  Location: [place] CV LAB;  Service: Cardiovascular;  Laterality: N/A;   TEE WITHOUT CARDIOVERSION N/A 07/30/2012   Procedure: TRANSESOPHAGEAL ECHOCARDIOGRAM (TEE);  Surgeon: Hailey Klein, MD;  Location: [facility] ENDOSCOPY;  Service: Cardiovascular;  Laterality: N/A;  h&p in file-hope   TEE WITHOUT CARDIOVERSION N/A 08/09/2013   Procedure: TRANSESOPHAGEAL ECHOCARDIOGRAM (TEE);  Surgeon: Hailey Perla, MD;  Location: [facility] ENDOSCOPY;  Service: Cardiovascular;  Laterality: N/A;   TOTAL ABDOMINAL HYSTERECTOMY  1981   ULTRASOUND GUIDANCE FOR VASCULAR ACCESS  04/22/2018   Procedure: Ultrasound Guidance For Vascular Access;  Surgeon: Hailey Hampshire, MD;  Location: [place] CV LAB;  Service: Cardiovascular;;    Allergies  Allergies  Allergen Reactions   Codeine Nausea And Vomiting and Nausea Only   Demerol Hcl [Meperidine] Nausea Only   Ketorolac Nausea And Vomiting, Nausea Only and Other (See Comments)    "pt told to never take this med"   Demerol Nausea And Vomiting   Multaq [Dronedarone] Other (See Comments)    Increased fluid retention   Flexeril [Cyclobenzaprine] Nausea And Vomiting    History of Present Illness    [name] returns today for pre-operative clearance to have thyroidectomy and removal of goiter. She was given instructions that she was cleared to move forward with surgery by telephone on 08/02/2022, and advised to notify her surgeon.  She has been treated for chronic diastolic CHF with GDMT and prescribed Farxiga with 8 lb weight loss. She was stable from cardiac standpoint on last office visit.   Telephone note from 08/02/2022 did inform her to contact her surgeon to go ahead and set up surgical date for removal of goiter and partial thyroidectomy.  The patient has not done so at this time.  We will need to have this information so that we can provide instruction concerning holding Eliquis preoperatively.  She comes today having not been in touch with her surgeon.  She was unaware that  she could move forward despite this telephone call documentation.  She is doing well, maintaining her weight, without chest discomfort shortness of breath or dyspnea on exertion.  She denies significant issues swallowing but is ready to have the thyroid goiter removed.  She is medically compliant.  Home Medications    Current Outpatient Medications  Medication Sig Dispense Refill   acetaminophen (TYLENOL) 500 MG tablet Take 500-1,000 mg by mouth every 6 (six) hours as needed (pain.).     benzonatate (TESSALON PERLES) 100 MG capsule Take 1 capsule (100 mg total) by mouth 3 (three) times daily. 21 capsule 0   Cholecalciferol (VITAMIN D3) 1000 UNITS CAPS Take 1,000 Units by mouth in the morning.     Cyanocobalamin (VITAMIN B12 PO) Take 1 tablet by mouth 2 (two) times a week.     dapagliflozin propanediol (FARXIGA) 10 MG TABS tablet Take 1 tablet (10 mg total) by mouth daily before breakfast. 90 tablet 6   ELIQUIS 5 MG TABS tablet TAKE ONE TABLET BY MOUTH TWICE DAILY 60 tablet 11   furosemide (LASIX) 80 MG tablet Take 1 tablet (80 mg total) by mouth 2 (two) times daily. [place]  tablet 1   glucose blood (ONETOUCH ULTRA) test strip USE DAILY TO CHECK BLOOD SUGAR E11.8 100 each 11   Lancets Misc. (ONE TOUCH SURESOFT) MISC Use to test  Blood sugar once daily DX: E11.8, 1 each 0   loperamide (IMODIUM) 2 MG capsule Take 2 mg by mouth as needed for diarrhea or loose stools.      methimazole (TAPAZOLE) 10 MG tablet TAKE 1 TABLET BY MOUTH ONCE DAILY WITH FOOD FOR THYROID 90 tablet 1   metoprolol tartrate (LOPRESSOR) 50 MG tablet TAKE 1 TABLET BY MOUTH ONCE DAILY IN THE MORNING THEN TAKE 1/2 (ONE-HALF) TABLET ONCE DAILY IN THE EVENING 135 tablet 2   ondansetron (ZOFRAN) 4 MG tablet Take 1 tablet (4 mg total) by mouth every 8 (eight) hours as needed for nausea or vomiting. 20 tablet 0   oxybutynin (DITROPAN) 5 MG tablet Take 1 tablet (5 mg total) by mouth daily. 90 tablet 0   pantoprazole (PROTONIX) 40 MG tablet  Take 1 tablet (40 mg total) by mouth daily. 90 tablet 3   potassium chloride SA (KLOR-CON M) 20 MEQ tablet Take 1 tablet (20 mEq total) by mouth 2 (two) times daily. 180 tablet 1   sildenafil (REVATIO) 20 MG tablet Take 1 tablet (20 mg total) by mouth 3 (three) times daily. 90 tablet 5   sitaGLIPtin (JANUVIA) 100 MG tablet Take 1 tablet by mouth once daily 90 tablet 1   spironolactone (ALDACTONE) 25 MG tablet Take 1 tablet (25 mg total) by mouth daily. 90 tablet 3   No current facility-administered medications for this visit.     Family History    Family History  Problem Relation Age of Onset   Cancer Brother    Heart attack Sister    Breast cancer Sister    Diabetes Sister    Heart disease Sister    Breast cancer Sister    Dementia Sister    Colon cancer Sister    Heart disease Sister    Breast cancer Sister    Suicidality Brother    Prostate cancer Brother    Dementia Brother    She indicated that her mother is deceased. She indicated that her father is deceased. She indicated that three of her seven sisters are alive. She indicated that only one of her three brothers is alive. She indicated that her maternal grandmother is deceased. She indicated that her maternal grandfather is deceased. She indicated that her paternal grandmother is deceased. She indicated that her paternal grandfather is deceased. She indicated that all of her three children are alive.  Social History    Social History   Socioeconomic History   Marital status: Married    Spouse name: [name]   Number of children: 3   Years of education: 13   Highest education level: Some college, no degree  Occupational History   Occupation: housewife  Tobacco Use   Smoking status: Never   Smokeless tobacco: Never  Vaping Use   Vaping Use: Never used  Substance and Sexual Activity   Alcohol use: No   Drug use: No   Sexual activity: Not Currently    Birth control/protection: Abstinence  Other Topics Concern    Not on file  Social History Narrative   Lives in [place].  Retired.  Was office clerk in several companies.  Did some technical school after completing high school.  She has no pets.  She and her husband live in a one-story home.  They've been married  since 1965.  She follows a heart healthy diet.  She has a living will and hcpoa but does not have a DNR form.  She does struggle to prepare food.     Social Determinants of Health   Financial Resource Strain: Not on file  Food Insecurity: Not on file  Transportation Needs: Not on file  Physical Activity: Not on file  Stress: Not on file  Social Connections: Not on file  Intimate Partner Violence: Not on file     Review of Systems    General:  No chills, fever, night sweats or weight changes.  Cardiovascular:  No chest pain, dyspnea on exertion, edema, orthopnea, palpitations, paroxysmal nocturnal dyspnea. Dermatological: No rash, lesions/masses Respiratory: No cough, dyspnea Urologic: No hematuria, dysuria Abdominal:   No nausea, vomiting, diarrhea, bright red blood per rectum, melena, or hematemesis Neurologic:  No visual changes, wkns, changes in mental status. All other systems reviewed and are otherwise negative except as noted above.     Physical Exam    VS:  BP (!) 118/58 (BP Location: Left Arm, Patient Position: Sitting, Cuff Size: Normal)   Pulse 60   Ht 4\' 11"  (1.499 m)   Wt 123 lb (55.8 kg)   SpO2 99%   BMI 24.84 kg/m  , BMI Body mass index is 24.84 kg/m.     GEN: Well nourished, well developed, in no acute distress. HEENT: normal. Neck: Supple, no JVD, carotid bruits, or positive for thyroid goiter on the right. Cardiac: Irregular RRR, no murmurs, rubs, or gallops. No clubbing, cyanosis, edema.  Radials/DP/PT 2+ and equal bilaterally.  Respiratory:  Respirations regular and unlabored, clear to auscultation bilaterally. GI: Soft, nontender, nondistended, BS + x 4. MS: no deformity or atrophy. Skin: warm and  dry, no rash. Neuro:  Strength and sensation are intact. Psych: Normal affect.  Accessory Clinical Findings    ECG personally reviewed by me today-V paced rhythm, heart rate of 60 bpm- No acute changes  Lab Results  Component Value Date   WBC 7.9 08/01/2022   HGB 12.2 08/01/2022   HCT 38.5 08/01/2022   MCV 87 08/01/2022   PLT 279 08/01/2022   Lab Results  Component Value Date   CREATININE 1.37 (H) 08/01/2022   BUN 15 08/01/2022   NA 136 08/01/2022   K 4.5 08/01/2022   CL 98 08/01/2022   CO2 22 08/01/2022   Lab Results  Component Value Date   ALT 8 08/01/2022   AST 17 08/01/2022   ALKPHOS 123 (H) 08/01/2022   BILITOT 1.2 08/01/2022   Lab Results  Component Value Date   CHOL 125 11/13/2020   HDL 50 11/13/2020   LDLCALC 60 11/13/2020   TRIG 70 11/13/2020   CHOLHDL 2.5 11/13/2020    Lab Results  Component Value Date   HGBA1C 6.2 (H) 11/20/2021    Review of Prior Studies: Right Heart Catheterization 07/09/2022 RA = 12 RV = 76/11 PA = 73/27 (41) PCW = 23 Fick cardiac output/index = 3.2/2.1 PVR = 5.6 WU Ao sat = 96% PA sat = 56%, 58% PAPi = 3.8   Assessment: 1. Mixed moderate pulmonary venous and pulmonary arterial HTN with elevated filling pressures and low cardiac output   Echocardiogram 07/01/2022  1. Evidence for moderate pulmonary HTN Estimated PAs pressure 62 mmHg.   2. Left ventricular ejection fraction, by estimation, is 60 to 65%. The  left ventricle has normal function. The left ventricle has no regional  wall motion abnormalities. Left ventricular diastolic  parameters were  normal.   3. Catheter in RA/RV . Right ventricular systolic function is moderately  reduced. The right ventricular size is normal. There is moderately  elevated pulmonary artery systolic pressure.   4. Left atrial size was mildly dilated.   5. Right atrial size was moderately dilated.   6. The mitral valve is abnormal. No evidence of mitral valve  regurgitation. No evidence  of mitral stenosis.   7. Tricuspid valve regurgitation is severe.   8. The aortic valve is tricuspid. Aortic valve regurgitation is trivial.  No aortic stenosis is present.   9. The inferior vena cava is dilated in size with <50% respiratory  variability, suggesting right atrial pressure of 15 mmHg.     Assessment & Plan   1.  Preoperative cardiac evaluation: She has recently  had a preoperative cardiovascular evaluation on 08/02/2022 and has been cleared to proceed with surgery.  She will need to be seen by surgeon to ascertain date set and to have a preoperative cardiology recommendation concerning Eliquis cessation once data is obtained.   I advised her to call her surgeon Dr. Roseanna Rainbow, and advised them that we have cleared her but will need further information concerning date so that pharmacist can weigh in concerning Eliquis.  She verbalizes understanding.  A copy of this note will be sent to Dr. Roseanna Rainbow with [place].  2.  Chronic diastolic CHF: Is now on guideline directed medical therapy with Farxiga, diuretic with furosemide 80 mg twice daily, spironolactone 25 mg daily.  She has maintained her weight and has no signs of volume overload currently.  Continue current medication regimen follow-up with Dr. Sallyanne [name] in 3 months.  3.  Tachybradycardia syndrome: Status post RF ablation procedures.  Now has permanent pacemaker followed by Dr. Sallyanne [name].  Continue interrogations per protocol.  4.  Thyroid goiter: Plan to follow-up with Dr. Roseanna Rainbow, for surgical scheduling and discussion.  Preoperative evaluation as discussed above.  Will need pharmacy to weigh in concerning Eliquis.    5.  Hypertension: Blood pressure was well-controlled today in the office.  Continue medication regimen with follow-up postoperatively with cardiology.       Signed, Phill Myron. West Pugh, ANP, [place]   08/30/2022 4:10 PM      Office [phone] Fax [phone]  Notice: This  dictation was prepared with Dragon dictation along with smaller phrase technology. Any transcriptional errors that result from this process are unintentional and may not be corrected upon review.

## 2022-08-30 ENCOUNTER — Ambulatory Visit: Payer: Medicare Other | Attending: Adult Health | Admitting: Adult Health

## 2022-08-30 ENCOUNTER — Encounter: Payer: Self-pay | Admitting: Adult Health

## 2022-08-30 VITALS — BP 118/58 | HR 60 | Ht 59.0 in | Wt 123.0 lb

## 2022-08-30 DIAGNOSIS — Z01810 Encounter for preprocedural cardiovascular examination: Secondary | ICD-10-CM | POA: Diagnosis not present

## 2022-08-30 DIAGNOSIS — I4821 Permanent atrial fibrillation: Secondary | ICD-10-CM

## 2022-08-30 DIAGNOSIS — Z950 Presence of cardiac pacemaker: Secondary | ICD-10-CM | POA: Diagnosis not present

## 2022-08-30 DIAGNOSIS — I10 Essential (primary) hypertension: Secondary | ICD-10-CM | POA: Diagnosis not present

## 2022-08-30 DIAGNOSIS — I3139 Other pericardial effusion (noninflammatory): Secondary | ICD-10-CM

## 2022-08-30 DIAGNOSIS — E042 Nontoxic multinodular goiter: Secondary | ICD-10-CM

## 2022-08-30 DIAGNOSIS — I5032 Chronic diastolic (congestive) heart failure: Secondary | ICD-10-CM

## 2022-08-30 NOTE — Patient Instructions (Signed)
Medication Instructions:  Your physician recommends that you continue on your current medications as directed. Please refer to the Current Medication list given to you today.   *If you need a refill on your cardiac medications before your next appointment, please call your pharmacy*   Lab Work: None ordered    Testing/Procedures: None ordered   Follow-Up: At [facility], you and your health needs are our priority.  As part of our continuing mission to provide you with exceptional heart care, we have created designated Provider Care Teams.  These Care Teams include your primary Cardiologist (physician) and Advanced Practice Providers (APPs -  Physician Assistants and Nurse Practitioners) who all work together to provide you with the care you need, when you need it.  We recommend signing up for the patient portal called "MyChart".  Sign up information is provided on this After Visit Summary.  MyChart is used to connect with patients for Virtual Visits (Telemedicine).  Patients are able to view lab/test results, encounter notes, upcoming appointments, etc.  Non-urgent messages can be sent to your provider as well.   To learn more about what you can do with MyChart, go to [email or web address].    Your next appointment:   3 month(s)  Provider:   Sanda Klein, MD  or Jory Sims, DNP, ANP

## 2022-09-09 ENCOUNTER — Other Ambulatory Visit: Payer: Self-pay | Admitting: Otolaryngology

## 2022-09-09 ENCOUNTER — Telehealth: Payer: Self-pay | Admitting: Cardiovascular Disease

## 2022-09-09 DIAGNOSIS — I5032 Chronic diastolic (congestive) heart failure: Secondary | ICD-10-CM | POA: Diagnosis not present

## 2022-09-09 DIAGNOSIS — Z515 Encounter for palliative care: Secondary | ICD-10-CM | POA: Diagnosis not present

## 2022-09-09 NOTE — Telephone Encounter (Signed)
Patient with diagnosis of atrial fibrillation on Eliquis for anticoagulation.    Procedure:   Total Thyroidectomy   Date of Surgery:  Clearance 09/20/22     CHA2DS2-VASc Score = 7   This indicates a 11.2% annual risk of stroke. The patient's score is based upon: CHF History: 1 HTN History: 1 Diabetes History: 1 Stroke History: 0 Vascular Disease History: 1 Age Score: 2 Gender Score: 1    CrCl 30 Platelet count 279  Per office protocol, patient can hold Eliquis for 2 days prior to procedure.   Patient will not need bridging with Lovenox (enoxaparin) around procedure.  **This guidance is not considered finalized until pre-operative APP has relayed final recommendations.**

## 2022-09-09 NOTE — Telephone Encounter (Signed)
   Pre-operative Risk Assessment    Patient Name: Hailey [name]  DOB: [birth year] MRN: [id number]      Request for Surgical Clearance    Procedure:   Total Thyroidectomy  Date of Surgery:  Clearance 09/20/22                                 Surgeon:  Dr Cheron Schaumann Surgeon's Group or Practice Name:  [facility] Phone number:  [phone] Fax number:  [phone] Attn: Angie/Clearance   Type of Clearance Requested:   - Pharmacy:  Hold Apixaban (Eliquis) How long to hold?   Type of Anesthesia:  General    Additional requests/questions:      [name]   09/09/2022, 9:40 AM

## 2022-09-09 NOTE — Telephone Encounter (Signed)
     Primary Cardiologist: [name], MD  Chart reviewed as part of pre-operative protocol coverage. Given past medical history and time since last visit, based on ACC/AHA guidelines, Lizania H Common would be at acceptable risk for the planned procedure without further cardiovascular testing.   Patient with diagnosis of atrial fibrillation on Eliquis for anticoagulation.     Procedure:   Total Thyroidectomy   Date of Surgery:  Clearance 09/20/22       CHA2DS2-VASc Score = 7   This indicates a 11.2% annual risk of stroke. The patient's score is based upon: CHF History: 1 HTN History: 1 Diabetes History: 1 Stroke History: 0 Vascular Disease History: 1 Age Score: 2 Gender Score: 1     CrCl 30 Platelet count 279   Per office protocol, patient can hold Eliquis for 2 days prior to procedure.   Patient will not need bridging with Lovenox (enoxaparin) around procedure.  I will route this recommendation to the requesting party via Epic fax function and remove from pre-op pool.  Please call with questions.  Thomasene Ripple. Wonda Goodgame NP-C    09/09/2022, 12:09 PM [facility] [address] Office [phone] Fax [phone]

## 2022-09-11 NOTE — Progress Notes (Signed)
Remote pacemaker transmission.   

## 2022-09-13 ENCOUNTER — Encounter: Payer: Self-pay | Admitting: Family Medicine

## 2022-09-13 NOTE — Progress Notes (Signed)
Surgical Instructions    Your procedure is scheduled on Friday April 19th.  Report to [facility] Main Entrance "A" at 7:30 A.M., then check in with the Admitting office.  Call this number if you have problems the morning of surgery:  [phone]   If you have any questions prior to your surgery date call [phone]: Open Monday-Friday 8am-4pm If you experience any cold or flu symptoms such as cough, fever, chills, shortness of breath, etc. between now and your scheduled surgery, please notify us at the above number     Remember:  Do not eat after midnight the night before your surgery  You may drink clear liquids until 6:30am the morning of your surgery.   Clear liquids allowed are: Water, Non-Citrus Juices (without pulp), Carbonated Beverages, Clear Tea, Black Coffee ONLY (NO MILK, CREAM OR POWDERED CREAMER of any kind), and Gatorade    Take these medicines the morning of surgery with A SIP OF WATER: methimazole (TAPAZOLE) 10 MG tablet  metoprolol tartrate (LOPRESSOR) 50 MG tablet  oxybutynin (DITROPAN) 5 MG tablet  pantoprazole (PROTONIX) 40 MG tablet  potassium chloride SA (KLOR-CON M) 20 MEQ tablet    IF NEEDED  acetaminophen (TYLENOL) 500 MG tablet  loperamide (IMODIUM) 2 MG capsule  ondansetron (ZOFRAN) 4 MG tablet      Follow your surgeon's instructions on when to stop Eliquis.  If no instructions were given by your surgeon then you will need to call the office to get those instructions.    As of today, STOP taking any Aspirin (unless otherwise instructed by your surgeon) Aleve, Naproxen, Ibuprofen, Motrin, Advil, Goody's, BC's, all herbal medications, fish oil, and all vitamins.  WHAT DO I DO ABOUT MY DIABETES MEDICATION?  Hold you FARXIGA 3 days prior to surgery  Do not take oral diabetes medicines (Januvia, Farxiga) the morning of surgery.  If your CBG is greater than 220 mg/dL, you may take  of your sliding scale (correction) dose of insulin.   HOW TO  MANAGE YOUR DIABETES BEFORE AND AFTER SURGERY  Why is it important to control my blood sugar before and after surgery? Improving blood sugar levels before and after surgery helps healing and can limit problems. A way of improving blood sugar control is eating a healthy diet by:  Eating less sugar and carbohydrates  Increasing activity/exercise  Talking with your doctor about reaching your blood sugar goals High blood sugars (greater than 180 mg/dL) can raise your risk of infections and slow your recovery, so you will need to focus on controlling your diabetes during the weeks before surgery. Make sure that the doctor who takes care of your diabetes knows about your planned surgery including the date and location.  How do I manage my blood sugar before surgery? Check your blood sugar at least 4 times a day, starting 2 days before surgery, to make sure that the level is not too high or low.  Check your blood sugar the morning of your surgery when you wake up and every 2 hours until you get to the Short Stay unit.  If your blood sugar is less than 70 mg/dL, you will need to treat for low blood sugar: Do not take insulin. Treat a low blood sugar (less than 70 mg/dL) with  cup of clear juice (cranberry or apple), 4 glucose tablets, OR glucose gel. Recheck blood sugar in 15 minutes after treatment (to make sure it is greater than 70 mg/dL). If your blood sugar is not greater than  70 mg/dL on recheck, call [phone] for further instructions. Report your blood sugar to the short stay nurse when you get to Short Stay.  If you are admitted to the hospital after surgery: Your blood sugar will be checked by the staff and you will probably be given insulin after surgery (instead of oral diabetes medicines) to make sure you have good blood sugar levels. The goal for blood sugar control after surgery is 80-180 mg/dL.          Do not wear jewelry or makeup. Do not wear lotions, powders, perfumes or  deodorant. Do not shave 48 hours prior to surgery.   Do not bring valuables to the hospital. Do not wear nail polish, gel polish, artificial nails, or any other type of covering on natural nails (fingers and toes) If you have artificial nails or gel coating that need to be removed by a nail salon, please have this removed prior to surgery. Artificial nails or gel coating may interfere with anesthesia's ability to adequately monitor your vital signs.  [facility] is not responsible for any belongings or valuables.    Do NOT Smoke (Tobacco/Vaping)  24 hours prior to your procedure  If you use a CPAP at night, you may bring your mask for your overnight stay.   Contacts, glasses, hearing aids, dentures or partials may not be worn into surgery, please bring cases for these belongings   For patients admitted to the hospital, discharge time will be determined by your treatment team.   Patients discharged the day of surgery will not be allowed to drive home, and someone needs to stay with them for 24 hours.   SURGICAL WAITING ROOM VISITATION Patients having surgery or a procedure may have no more than 2 support people in the waiting area - these visitors may rotate.   Children under the age of 93 must have an adult with them who is not the patient. If the patient needs to stay at the hospital during part of their recovery, the visitor guidelines for inpatient rooms apply. Pre-op nurse will coordinate an appropriate time for 1 support person to accompany patient in pre-op.  This support person may not rotate.   Please refer to [email or web address] for the visitor guidelines for Inpatients (after your surgery is over and you are in a regular room).    Special instructions:    Oral Hygiene is also important to reduce your risk of infection.  Remember - BRUSH YOUR TEETH THE MORNING OF SURGERY WITH YOUR REGULAR TOOTHPASTE   [facility]- Preparing  For Surgery  Before surgery, you can play an important role. Because skin is not sterile, your skin needs to be as free of germs as possible. You can reduce the number of germs on your skin by washing with CHG (chlorahexidine gluconate) Soap before surgery.  CHG is an antiseptic cleaner which kills germs and bonds with the skin to continue killing germs even after washing.     Please do not use if you have an allergy to CHG or antibacterial soaps. If your skin becomes reddened/irritated stop using the CHG.  Do not shave (including legs and underarms) for at least 48 hours prior to first CHG shower. It is OK to shave your face.  Please follow these instructions carefully.     Shower the NIGHT BEFORE SURGERY and the MORNING OF SURGERY with CHG Soap.   If you chose to wash your hair, wash your hair first as usual with your normal shampoo.  After you shampoo, rinse your hair and body thoroughly to remove the shampoo.  Then [employer] and genitals (private parts) with your normal soap and rinse thoroughly to remove soap.  After that Use CHG Soap as you would any other liquid soap. You can apply CHG directly to the skin and wash gently with a scrungie or a clean washcloth.   Apply the CHG Soap to your body ONLY FROM THE NECK DOWN.  Do not use on open wounds or open sores. Avoid contact with your eyes, ears, mouth and genitals (private parts). Wash Face and genitals (private parts)  with your normal soap.   Wash thoroughly, paying special attention to the area where your surgery will be performed.  Thoroughly rinse your body with warm water from the neck down.  DO NOT shower/wash with your normal soap after using and rinsing off the CHG Soap.  Pat yourself dry with a CLEAN TOWEL.  Wear CLEAN PAJAMAS to bed the night before surgery  Place CLEAN SHEETS on your bed the night before your surgery  DO NOT SLEEP WITH PETS.   Day of Surgery:  Take a shower with CHG soap. Wear Clean/Comfortable  clothing the morning of surgery Do not apply any deodorants/lotions.   Remember to brush your teeth WITH YOUR REGULAR TOOTHPASTE.    If you received a COVID test during your pre-op visit, it is requested that you wear a mask when out in public, stay away from anyone that may not be feeling well, and notify your surgeon if you develop symptoms. If you have been in contact with anyone that has tested positive in the last 10 days, please notify your surgeon.    Please read over the following fact sheets that you were given.

## 2022-09-13 NOTE — Progress Notes (Signed)
Notified [employer] ([employer]) of upcoming procedure.  Left information for representative Kerry Fort. [phone]  Device orders requested from [facility] Dr Rubie Maid.

## 2022-09-16 ENCOUNTER — Encounter: Payer: Self-pay | Admitting: Cardiovascular Disease

## 2022-09-16 ENCOUNTER — Other Ambulatory Visit: Payer: Self-pay

## 2022-09-16 ENCOUNTER — Encounter (HOSPITAL_COMMUNITY)
Admission: RE | Admit: 2022-09-16 | Discharge: 2022-09-16 | Disposition: A | Discharge status: Home / Self Care | Payer: Medicare Other | Source: Ambulatory Visit | Attending: Otolaryngology | Admitting: Otolaryngology

## 2022-09-16 ENCOUNTER — Encounter (HOSPITAL_COMMUNITY): Payer: Self-pay

## 2022-09-16 VITALS — BP 121/54 | HR 61 | Temp 97.5°F | Resp 18 | Ht 59.0 in | Wt 124.6 lb

## 2022-09-16 DIAGNOSIS — Z950 Presence of cardiac pacemaker: Secondary | ICD-10-CM | POA: Insufficient documentation

## 2022-09-16 DIAGNOSIS — I495 Sick sinus syndrome: Secondary | ICD-10-CM | POA: Insufficient documentation

## 2022-09-16 DIAGNOSIS — E0500 Thyrotoxicosis with diffuse goiter without thyrotoxic crisis or storm: Secondary | ICD-10-CM | POA: Diagnosis not present

## 2022-09-16 DIAGNOSIS — R1310 Dysphagia, unspecified: Secondary | ICD-10-CM | POA: Diagnosis not present

## 2022-09-16 DIAGNOSIS — I110 Hypertensive heart disease with heart failure: Secondary | ICD-10-CM | POA: Diagnosis not present

## 2022-09-16 DIAGNOSIS — Z7901 Long term (current) use of anticoagulants: Secondary | ICD-10-CM | POA: Insufficient documentation

## 2022-09-16 DIAGNOSIS — I2510 Atherosclerotic heart disease of native coronary artery without angina pectoris: Secondary | ICD-10-CM | POA: Insufficient documentation

## 2022-09-16 DIAGNOSIS — I2721 Secondary pulmonary arterial hypertension: Secondary | ICD-10-CM | POA: Insufficient documentation

## 2022-09-16 DIAGNOSIS — I4819 Other persistent atrial fibrillation: Secondary | ICD-10-CM | POA: Insufficient documentation

## 2022-09-16 DIAGNOSIS — I5032 Chronic diastolic (congestive) heart failure: Secondary | ICD-10-CM | POA: Diagnosis not present

## 2022-09-16 DIAGNOSIS — Z01812 Encounter for preprocedural laboratory examination: Secondary | ICD-10-CM | POA: Insufficient documentation

## 2022-09-16 DIAGNOSIS — Z01818 Encounter for other preprocedural examination: Secondary | ICD-10-CM

## 2022-09-16 DIAGNOSIS — Z7984 Long term (current) use of oral hypoglycemic drugs: Secondary | ICD-10-CM | POA: Diagnosis not present

## 2022-09-16 DIAGNOSIS — E139 Other specified diabetes mellitus without complications: Secondary | ICD-10-CM

## 2022-09-16 HISTORY — DX: Unspecified hearing loss, unspecified ear: H91.90

## 2022-09-16 HISTORY — DX: Dyspnea, unspecified: R06.00

## 2022-09-16 HISTORY — DX: Sleep apnea, unspecified: G47.30

## 2022-09-16 LAB — CBC
HCT: 41 % (ref 36.0–46.0)
Hemoglobin: 12.9 g/dL (ref 12.0–15.0)
MCH: 27 pg (ref 26.0–34.0)
MCHC: 31.5 g/dL (ref 30.0–36.0)
MCV: 86 fL (ref 80.0–100.0)
Platelets: 282 10*3/uL (ref 150–400)
RBC: 4.77 MIL/uL (ref 3.87–5.11)
RDW: 13.9 % (ref 11.5–15.5)
WBC: 8.8 10*3/uL (ref 4.0–10.5)
nRBC: 0 % (ref 0.0–0.2)

## 2022-09-16 LAB — BASIC METABOLIC PANEL
Anion gap: 9 (ref 5–15)
BUN: 19 mg/dL (ref 8–23)
CO2: 28 mmol/L (ref 22–32)
Calcium: 9.1 mg/dL (ref 8.9–10.3)
Chloride: 96 mmol/L — ABNORMAL LOW (ref 98–111)
Creatinine, Ser: 1.26 mg/dL — ABNORMAL HIGH (ref 0.44–1.00)
GFR, Estimated: 44 mL/min — ABNORMAL LOW (ref >60)
Glucose, Bld: 107 mg/dL — ABNORMAL HIGH (ref 70–99)
Potassium: 4.5 mmol/L (ref 3.5–5.1)
Sodium: 133 mmol/L — ABNORMAL LOW (ref 135–145)

## 2022-09-16 LAB — HEMOGLOBIN A1C
Hgb A1c MFr Bld: 6.3 % — ABNORMAL HIGH (ref 4.8–5.6)
Mean Plasma Glucose: 134.11 mg/dL

## 2022-09-16 LAB — GLUCOSE, CAPILLARY: Glucose-Capillary: 151 mg/dL — ABNORMAL HIGH (ref 70–99)

## 2022-09-16 NOTE — Progress Notes (Signed)
PCP - Dr Jacalyn Lefevre EP & Cardiologist - Dr [name] ([facility] in [facility])  CT Chest x-ray - 08/16/21 EKG - 08/30/22 Stress Test - 12/20/10 ECHO - 07/01/22 Cardiac Cath - 07/09/22  ICD Pacemaker - Have magnet available per Dr [name].  St Jude Pacemaker. Last remote check was on 08/07/22.  Rep. Kerry Fort was notified by Ginger Gleason, RN.  Sleep Study -  Yes (11/2021, home test) CPAP - uses nightly  Diabetes Type 2  Stop Farxiga 72 hours prior to procedure.  Last dose will be on 09/16/22  Last dose orf ELiquis was 09/14/22.  Do not take oral diabetes medicines (pills) the morning of surgery.  If your blood sugar is less than 70 mg/dL, you will need to treat for low blood sugar: Treat a low blood sugar (less than 70 mg/dL) with  cup of clear juice (cranberry or apple), 4 glucose tablets, OR glucose gel. Recheck blood sugar in 15 minutes after treatment (to make sure it is greater than 70 mg/dL). If your blood sugar is not greater than 70 mg/dL on recheck, call [phone] for further instructions.  Blood Thinner Instructions:  Last dose of Eliquis was on 09/14/22.    ERAS: Clear liquids til 8 AM DOS.  Anesthesia review: Yes  STOP now taking any Aspirin (unless otherwise instructed by your surgeon), Aleve, Naproxen, Ibuprofen, Motrin, Advil, Goody's, BC's, all herbal medications, fish oil, and all vitamins.   Coronavirus Screening Do you have any of the following symptoms:  Cough yes/no: No Fever (>100.33F)  yes/no: No Runny nose yes/no: No Sore throat yes/no: No Difficulty breathing/shortness of breath - occasional  Have you traveled in the last 14 days and where? yes/no: No  Patient verbalized understanding of instructions that were given to them at the PAT appointment. Patient was also instructed that they will need to review over the PAT instructions again at home before surgery.

## 2022-09-16 NOTE — Progress Notes (Signed)
PERIOPERATIVE PRESCRIPTION FOR IMPLANTED CARDIAC DEVICE PROGRAMMING  Patient Information: Name:  Hailey [name]  DOB:  1945/09/10  MRN:  [id number]    Planned Procedure:  Total Thyroidectomy  Surgeon:  Skotnicki  Date of Procedure:  09/20/22  Cautery will be used.  Position during surgery:  Supine   Please send documentation back to:  [facility] (Fax # [phone])  Device Information:  Clinic EP Physician:  Dr. Melanee Spry Croitoru   Device Type:  Pacemaker Manufacturer and Phone #:  St[employer] Jude/[employer]: [phone] Pacemaker Dependent?:  Unknown Date of Last Device Check:  08/07/2022 Normal Device Function?:  Yes.    Electrophysiologist's Recommendations:  Have magnet available. Provide continuous ECG monitoring when magnet is used or reprogramming is to be performed.  Procedure may interfere with device function.  Magnet should be placed over device during procedure.  Per Device Clinic Standing Orders, Lenor Coffin, RN  8:35 AM 09/16/2022

## 2022-09-16 NOTE — Progress Notes (Signed)
Surgical Instructions    Your procedure is scheduled on Friday April 19th.  Report to [facility] Main Entrance "A" at  9 A.M., then check in with the Admitting office.  Call this number if you have problems the morning of surgery:  [phone]   If you have any questions prior to your surgery date call [phone]: Open Monday-Friday 8am-4pm If you experience any cold or flu symptoms such as cough, fever, chills, shortness of breath, etc. between now and your scheduled surgery, please notify us at the above number     Remember:  Do not eat after midnight the night before your surgery-Thursday night  You may drink clear liquids until 8 am the morning of your surgery.   Clear liquids allowed are: Water, Non-Citrus Juices (without pulp), Carbonated Beverages, Clear Tea, Black Coffee ONLY (NO MILK, CREAM OR POWDERED CREAMER of any kind), and Gatorade    Take these medicines the morning of surgery with A SIP OF WATER: methimazole (TAPAZOLE) 10 MG tablet  metoprolol tartrate (LOPRESSOR) 50 MG tablet  oxybutynin (DITROPAN) 5 MG tablet  pantoprazole (PROTONIX) 40 MG tablet  potassium chloride SA (KLOR-CON M) 20 MEQ tablet    IF NEEDED  acetaminophen (TYLENOL) 500 MG tablet  loperamide (IMODIUM) 2 MG capsule  ondansetron (ZOFRAN) 4 MG tablet      Follow your surgeon's instructions on when to stop Eliquis.  If no instructions were given by your surgeon then you will need to call the office to get those instructions.    As of today, STOP taking any Aspirin (unless otherwise instructed by your surgeon) Aleve, Naproxen, Ibuprofen, Motrin, Advil, Goody's, BC's, all herbal medications, fish oil, and all vitamins.  WHAT DO I DO ABOUT MY DIABETES MEDICATION?  Hold you FARXIGA 3 days prior to surgery.  Last dose was on 09/16/22.  Do not take oral diabetes medicines (Januvia, Farxiga) the morning of surgery.   HOW TO MANAGE YOUR DIABETES BEFORE AND AFTER SURGERY  Why is it important to  control my blood sugar before and after surgery? Improving blood sugar levels before and after surgery helps healing and can limit problems. A way of improving blood sugar control is eating a healthy diet by:  Eating less sugar and carbohydrates  Increasing activity/exercise  Talking with your doctor about reaching your blood sugar goals High blood sugars (greater than 180 mg/dL) can raise your risk of infections and slow your recovery, so you will need to focus on controlling your diabetes during the weeks before surgery. Make sure that the doctor who takes care of your diabetes knows about your planned surgery including the date and location.  How do I manage my blood sugar before surgery? Check your blood sugar at least 4 times a day, starting 2 days before surgery, to make sure that the level is not too high or low.  Check your blood sugar the morning of your surgery when you wake up and every 2 hours until you get to the Short Stay unit.  If your blood sugar is less than 70 mg/dL, you will need to treat for low blood sugar: Treat a low blood sugar (less than 70 mg/dL) with  cup of clear juice (cranberry or apple), 4 glucose tablets, OR glucose gel. Recheck blood sugar in 15 minutes after treatment (to make sure it is greater than 70 mg/dL). If your blood sugar is not greater than 70 mg/dL on recheck, call [phone] for further instructions. Report your blood sugar to the short  stay nurse when you get to Short Stay.  If you are admitted to the hospital after surgery: Your blood sugar will be checked by the staff and you will probably be given insulin after surgery (instead of oral diabetes medicines) to make sure you have good blood sugar levels. The goal for blood sugar control after surgery is 80-180 mg/dL.          Do not wear jewelry or makeup. Do not wear lotions, powders, perfumes or deodorant. Do not shave 48 hours prior to surgery.   Do not bring valuables to the  hospital. Do not wear nail polish, gel polish, artificial nails, or any other type of covering on natural nails (fingers and toes) If you have artificial nails or gel coating that need to be removed by a nail salon, please have this removed prior to surgery. Artificial nails or gel coating may interfere with anesthesia's ability to adequately monitor your vital signs.  [facility] is not responsible for any belongings or valuables.    Do NOT Smoke (Tobacco/Vaping)  24 hours prior to your procedure  If you use a CPAP at night, you may bring your mask for your overnight stay.   Contacts, glasses, hearing aids, dentures or partials may not be worn into surgery, please bring cases for these belongings   For patients admitted to the hospital, discharge time will be determined by your treatment team.   Patients discharged the day of surgery will not be allowed to drive home, and someone needs to stay with them for 24 hours.   SURGICAL WAITING ROOM VISITATION Patients having surgery or a procedure may have no more than 2 support people in the waiting area - these visitors may rotate.   Children under the age of 23 must have an adult with them who is not the patient. If the patient needs to stay at the hospital during part of their recovery, the visitor guidelines for inpatient rooms apply. Pre-op nurse will coordinate an appropriate time for 1 support person to accompany patient in pre-op.  This support person may not rotate.   Please refer to [email or web address] for the visitor guidelines for Inpatients (after your surgery is over and you are in a regular room).    Special instructions:    Oral Hygiene is also important to reduce your risk of infection.  Remember - BRUSH YOUR TEETH THE MORNING OF SURGERY WITH YOUR REGULAR TOOTHPASTE   [facility]- Preparing For Surgery  Before surgery, you can play an important role. Because skin is not  sterile, your skin needs to be as free of germs as possible. You can reduce the number of germs on your skin by washing with CHG (chlorahexidine gluconate) Soap before surgery.  CHG is an antiseptic cleaner which kills germs and bonds with the skin to continue killing germs even after washing.     Please do not use if you have an allergy to CHG or antibacterial soaps. If your skin becomes reddened/irritated stop using the CHG.  Do not shave (including legs and underarms) for at least 48 hours prior to first CHG shower. It is OK to shave your face.  Please follow these instructions carefully.     Shower the NIGHT BEFORE SURGERY and the MORNING OF SURGERY with CHG Soap.   If you chose to wash your hair, wash your hair first as usual with your normal shampoo. After you shampoo, rinse your hair and body thoroughly to remove the shampoo.  Then [employer]  Face and genitals (private parts) with your normal soap and rinse thoroughly to remove soap.  After that Use CHG Soap as you would any other liquid soap. You can apply CHG directly to the skin and wash gently with a scrungie or a clean washcloth.   Apply the CHG Soap to your body ONLY FROM THE NECK DOWN.  Do not use on open wounds or open sores. Avoid contact with your eyes, ears, mouth and genitals (private parts). Wash Face and genitals (private parts)  with your normal soap.   Wash thoroughly, paying special attention to the area where your surgery will be performed.  Thoroughly rinse your body with warm water from the neck down.  DO NOT shower/wash with your normal soap after using and rinsing off the CHG Soap.  Pat yourself dry with a CLEAN TOWEL.  Wear CLEAN PAJAMAS to bed the night before surgery  Place CLEAN SHEETS on your bed the night before your surgery  DO NOT SLEEP WITH PETS.   Day of Surgery:  Take a shower with CHG soap. Wear Clean/Comfortable clothing the morning of surgery Do not apply any deodorants/lotions.   Remember to  brush your teeth WITH YOUR REGULAR TOOTHPASTE.    If you received a COVID test during your pre-op visit, it is requested that you wear a mask when out in public, stay away from anyone that may not be feeling well, and notify your surgeon if you develop symptoms. If you have been in contact with anyone that has tested positive in the last 10 days, please notify your surgeon.    Please read over the following fact sheets that you were given.

## 2022-09-17 NOTE — Progress Notes (Addendum)
Case: [id number] Date/Time: 09/20/22 1045   Procedure: TOTAL THYROIDECTOMY (Bilateral)   Anesthesia type: General   Pre-op diagnosis:      Nontoxic goiter, unspecified     Dysphagia, unspecified   [facility] / [facility]   Surgeons: Cheron Schaumann A, DO       DISCUSSION: Hailey [name] is a 77 year old female who presents for surgery listed above on 09/20/2022.  Past medical medical history significant for a large goiter with compressive symptoms, hyperthyroidism, pulmonary hypertension, sick sinus syndrome s/p pacemaker placement, nonobstructive CAD, dCHF, persistent atrial fibrillation on Eliquis, diabetes. Prior anesthesia complications include post-op nausea and vomiting.   The patient has had an enlarging goiter over some period of time which is now causing compressive symptoms.  She has been evaluated by ENT who recommended surgical resection.  She is also seeing endocrinology who agrees.    She follows with cardiology for her multiple cardiac issues.  In January she was having increased shortness of breath and underwent echocardiogram which showed worsening pulmonary hypertension.  She subsequently underwent a right heart cath which confirmed this.  Her diuretics were increased and shortness of breath improved.  At her last office visit on 08/30/2022 she was noted to be stable and medically optimized from a cardiac standpoint.  #Cardiac clearance note from telephone encounter on 09/09/22:  "Chart reviewed as part of pre-operative protocol coverage. Given past medical history and time since last visit, based on ACC/AHA guidelines, Hailey [name] would be at acceptable risk for the planned procedure without further cardiovascular testing.    Patient with diagnosis of atrial fibrillation on Eliquis for anticoagulation.     Procedure:   Total Thyroidectomy   Date of Surgery:  Clearance 09/20/22       CHA2DS2-VASc Score = 7   This indicates a 11.2% annual risk of stroke. The  patient's score is based upon: CHF History: 1 HTN History: 1 Diabetes History: 1 Stroke History: 0 Vascular Disease History: 1 Age Score: 2 Gender Score: 1   CrCl 30 Platelet count 279   Per office protocol, patient can hold Eliquis for 2 days prior to procedure.   Patient will not need bridging with Lovenox (enoxaparin) around procedure."  #Chronic A.fib on Eliquis -Rate controlled -Last dose of ELiquis was 09/14/22.   #DM -HgA1c 6.3 on 09/16/22 -Stop Farxiga 72 hours prior to procedure. Last dose will be on 09/16/22   #Mild OSA -Followed by pulmonology. Wears CPAP  VS: BP (!) 121/54   Pulse 61   Temp (!) 36.4 C (Oral)   Resp 18   Ht  (1.499 m)   Wt 56.5 kg   SpO2 98%   BMI 25.17 kg/m   PROVIDERS: Frederica Kuster, MD Cardiologist: [name], MD Pulmonology: Levy Pupa, MD  LABS: Labs reviewed: Acceptable for surgery. (all labs ordered are listed, but only abnormal results are displayed)  Labs Reviewed  GLUCOSE, CAPILLARY - Abnormal; Notable for the following components:      Result Value   Glucose-Capillary 151 (*)    All other components within normal limits  BASIC METABOLIC PANEL - Abnormal; Notable for the following components:   Sodium 133 (*)    Chloride 96 (*)    Glucose, Bld 107 (*)    Creatinine, Ser 1.26 (*)    GFR, Estimated 44 (*)    All other components within normal limits  HEMOGLOBIN A1C - Abnormal; Notable for the following components:   Hgb A1c MFr Bld  6.3 (*)    All other components within normal limits  CBC     IMAGES: CT chest w contrast 08/17/21:   IMPRESSION: Slowly enlarging heterogeneous thyroid compatible with large goiter. This is been present over numerous prior studies and is previously been evaluated with ultrasound. Given slight enlargement could consider ultrasound follow-up as warranted.   Multiple pulmonary nodules and scattered lymph nodes in the chest unchanged since 2020 and likely benign.    Aortic atherosclerosis and coronary artery disease.   Hepatic steatosis.   Aortic Atherosclerosis (ICD10-I70.0).     EKG 08/30/22: Impression: Ventricular paced rhythm   CV:  Echo 07/01/22:  IMPRESSIONS     1. Evidence for moderate pulmonary HTN Estimated PAs pressure 62 mmHg.   2. Left ventricular ejection fraction, by estimation, is 60 to 65%. The  left ventricle has normal function. The left ventricle has no regional  wall motion abnormalities. Left ventricular diastolic parameters were  normal.   3. Catheter in RA/RV . Right ventricular systolic function is moderately  reduced. The right ventricular size is normal. There is moderately  elevated pulmonary artery systolic pressure.   4. Left atrial size was mildly dilated.   5. Right atrial size was moderately dilated.   6. The mitral valve is abnormal. No evidence of mitral valve  regurgitation. No evidence of mitral stenosis.   7. Tricuspid valve regurgitation is severe.   8. The aortic valve is tricuspid. Aortic valve regurgitation is trivial.  No aortic stenosis is present.   9. The inferior vena cava is dilated in size with <50% respiratory  variability, suggesting right atrial pressure of 15 mmHg.   RHC 07/09/22:  Assessment: 1. Mixed moderate pulmonary venous and pulmonary arterial HTN with elevated filling pressures and low cardiac output   Plan/Discussion:    Options limited. Not candidate for selective pulmonary artery vasodilators. Would push diuretics as tolerated. Ensure oxygenation > 88% at all times. Consider pulmonary rehab.      Past Medical History:  Diagnosis Date   Acute on chronic diastolic CHF (congestive heart failure), NYHA class 1 04/29/2013   Allergic rhinitis    Brady-tachy syndrome    s/p STJ dual chamber PPM by Dr [name]   CAD (coronary artery disease)    70% RCA stenosis, treated medically   Control of atrial fibrillation with pacemaker    Diabetes mellitus    type 2   DJD  (degenerative joint disease)    Dyspnea    occasional   Hearing loss    Right ear only - no hearing aids   Hypertension    Hyperthyroidism    Paroxysmal atrial fibrillation    Pacemaker St Jude   Pneumonia    years ago- before 2012 per patient   Polio    as a child   PONV (postoperative nausea and vomiting)    Presence of permanent cardiac pacemaker    St Jude pacemaker   Pulmonary disease    Sleep apnea    uses  CPAP nightly    Past Surgical History:  Procedure Laterality Date   arm surgery d/t fx Right 1996   ATRIAL FIBRILLATION ABLATION  07/31/2012; 08-10-13   PVI by Dr [name]   ATRIAL FIBRILLATION ABLATION N/A 07/31/2012   Procedure: ATRIAL FIBRILLATION ABLATION;  Surgeon: Hillis Range, MD;  Location: [facility] CATH LAB;  Service: Cardiovascular;  Laterality: N/A;   ATRIAL FIBRILLATION ABLATION N/A 08/10/2013   Procedure: ATRIAL FIBRILLATION ABLATION;  Surgeon: Gardiner Rhyme, MD;  Location:  [facility] CATH LAB;  Service: Cardiovascular;  Laterality: N/A;   BACK SURGERY  07/26/2012   [facility]. [facility]   CARDIAC CATHETERIZATION  12/03/2010   single vessel mid RCA   CARDIAC ELECTROPHYSIOLOGY MAPPING AND ABLATION  07/31/2012   Dr. Hillis Range   CESAREAN SECTION  09/20/1974   Dr. Greta Doom   CHOLECYSTECTOMY  1994   COLONOSCOPY WITH PROPOFOL N/A 03/21/2015   Procedure: COLONOSCOPY WITH PROPOFOL;  Surgeon: Charna Elizabeth, MD;  Location: [facility] ENDOSCOPY;  Service: Endoscopy;  Laterality: N/A;   DIAGNOSTIC MAMMOGRAM  04/16/2017   Tylene Fantasia. Morris, DO   EYE SURGERY Bilateral    remove cataracts   LAPAROSCOPIC HYSTERECTOMY  Late 38s to early 61s   LEFT HEART CATH AND CORONARY ANGIOGRAPHY N/A 07/30/2017   Procedure: LEFT HEART CATH AND CORONARY ANGIOGRAPHY;  Surgeon: Lennette Bihari, MD;  Location: [facility] INVASIVE CV LAB;  Service: Cardiovascular;  Laterality: N/A;   LUMBAR LAMINECTOMY/DECOMPRESSION MICRODISCECTOMY Right 12/23/2012   Procedure: Right L5-S1 Laminotomy for resection of synovial cyst;  Surgeon:  [name], MD;  Location: [facility] NEURO ORS;  Service: Neurosurgery;  Laterality: Right;  Right Lumbar five-sacral one Laminotomy for resection of synovial cyst   NM MYOCAR PERF WALL MOTION  12/20/2010   normal   PACEMAKER IMPLANT N/A 08/09/2019   Procedure: PACEMAKER IMPLANT;  Surgeon: [name], MD;  [facility] INVASIVE CV LAB;  Service: Cardiovascular;  Laterality: N/A;   PACEMAKER INSERTION  12/03/2010   SJM Accent DR RF implanted by Dr [name]   RIGHT HEART CATH N/A 04/22/2018   Procedure: RIGHT HEART CATH;  Surgeon: Iran Ouch, MD;  Location: [facility] INVASIVE CV LAB;  Service: Cardiovascular;  Laterality: N/A;   RIGHT HEART CATH N/A 07/09/2022   Procedure: RIGHT HEART CATH;  Surgeon: Dolores Patty, MD;  [facility] INVAS[facility] CV LAB;  Service: Cardiovascular;  Laterality: N/A;   TEE WITHOUT CARDIOVERSION N/A 07/30/2012   Procedure: TRANSESOPHAGEAL ECHOCARDIOGRAM (TEE);  Surgeon: [name], MD;  Location: [facility] ENDOSCOPY;  Service: Cardiovascular;  Laterality: N/A;  h&p in file-hope   TEE WITHOUT CARDIOVERSION N/A 08/09/2013   Procedure: TRANSESOPHAGEAL ECHOCARDIOGRAM (TEE);  Surgeon: Lewayne Bunting, MD;  Location: [facility] ENDOSCOPY;  Service: Cardiovascular;  Laterality: N/A;   TOTAL ABDOMINAL HYSTERECTOMY  1981   ULTRASOUND GUIDANCE FOR VASCULAR ACCESS  04/22/2018   Procedure: Ultrasound Guidance For Vascular Access;  Surgeon: Iran Ouch, MD;  Location: [facility] INVASIVE CV LAB;  Service: Cardiovascular;;    MEDICATIONS:  dapagliflozin propanediol (FARXIGA) 10 MG TABS tablet   sitaGLIPtin (JANUVIA) 100 MG tablet   acetaminophen (TYLENOL) 500 MG tablet   Cholecalciferol (VITAMIN D3) 1000 UNITS CAPS   Cranberry 500 MG CAPS   Cyanocobalamin (VITAMIN B12) 500 MCG TABS   ELIQUIS 5 MG TABS tablet   furosemide (LASIX) 80 MG tablet   glucose blood (ONETOUCH ULTRA) test strip   Lancets Misc. (ONE TOUCH SURESOFT) MISC   loperamide (IMODIUM) 2 MG capsule   methimazole  (TAPAZOLE) 10 MG tablet   metoprolol tartrate (LOPRESSOR) 50 MG tablet   ondansetron (ZOFRAN) 4 MG tablet   oxybutynin (DITROPAN) 5 MG tablet   pantoprazole (PROTONIX) 40 MG tablet   potassium chloride SA (KLOR-CON M) 20 MEQ tablet   sildenafil (REVATIO) 20 MG tablet   spironolactone (ALDACTONE) 25 MG tablet   No current facility-administered medications for this encounter.    Marcille Blanco [facility] Short Stay/Anesthesiology [facility] Phone [phone] 09/17/2022 12:09 PM

## 2022-09-17 NOTE — Anesthesia Preprocedure Evaluation (Addendum)
Anesthesia Evaluation  Patient identified by MRN, date of birth, ID band Patient awake    Reviewed: Allergy & Precautions, NPO status , Patient's Chart, lab work & pertinent test results, reviewed documented beta blocker date and time   History of Anesthesia Complications (+) PONV and history of anesthetic complications  Airway Mallampati: II  TM Distance: >3 FB Neck ROM: Full    Dental  (+) Teeth Intact, Dental Advisory Given   Pulmonary sleep apnea and Continuous Positive Airway Pressure Ventilation    Pulmonary exam normal breath sounds clear to auscultation       Cardiovascular hypertension, Pt. on home beta blockers pulmonary hypertension(-) angina + CAD and +CHF  Normal cardiovascular exam+ dysrhythmias Atrial Fibrillation + pacemaker + Valvular Problems/Murmurs (severe TR) AI  Rhythm:Regular Rate:Normal  Echo 07/01/22: 1. Evidence for moderate pulmonary HTN Estimated PAs pressure 62 mmHg.   2. Left ventricular ejection fraction, by estimation, is 60 to 65%. The  left ventricle has normal function. The left ventricle has no regional  wall motion abnormalities. Left ventricular diastolic parameters were  normal.   3. Catheter in RA/RV . Right ventricular systolic function is moderately  reduced. The right ventricular size is normal. There is moderately  elevated pulmonary artery systolic pressure.   4. Left atrial size was mildly dilated.   5. Right atrial size was moderately dilated.   6. The mitral valve is abnormal. No evidence of mitral valve  regurgitation. No evidence of mitral stenosis.   7. Tricuspid valve regurgitation is severe.   8. The aortic valve is tricuspid. Aortic valve regurgitation is trivial.  No aortic stenosis is present.   9. The inferior vena cava is dilated in size with <50% respiratory  variability, suggesting right atrial pressure of 15 mmHg.     Neuro/Psych negative neurological ROS   negative psych ROS   GI/Hepatic Neg liver ROS,GERD  Medicated,,  Endo/Other  diabetes, Type 2, Oral Hypoglycemic Agents Hyperthyroidism   Renal/GU negative Renal ROS     Musculoskeletal  (+) Arthritis ,    Abdominal   Peds  Hematology  (+) Blood dyscrasia (Eliquis)   Anesthesia Other Findings Day of surgery medications reviewed with the patient.  Reproductive/Obstetrics                             Anesthesia Physical Anesthesia Plan  ASA: 4  Anesthesia Plan: General   Post-op Pain Management: Tylenol PO (pre-op)*   Induction:   PONV Risk Score and Plan: 4 or greater and Dexamethasone and Ondansetron  Airway Management Planned: Oral ETT and Video Laryngoscope Planned  Additional Equipment: Arterial line, CVP and Ultrasound Guidance Line Placement  Intra-op Plan:   Post-operative Plan: Possible Post-op intubation/ventilation  Informed Consent: I have reviewed the patients History and Physical, chart, labs and discussed the procedure including the risks, benefits and alternatives for the proposed anesthesia with the patient or authorized representative who has indicated his/her understanding and acceptance.     Dental advisory given  Plan Discussed with: CRNA  Anesthesia Plan Comments: (See PAT note from 4/15)        Anesthesia Quick Evaluation

## 2022-09-19 NOTE — Progress Notes (Signed)
Left voicemail for patient and her daughter with new arrival time of 0815 and clear liquids okay until 0715

## 2022-09-20 ENCOUNTER — Other Ambulatory Visit: Payer: Self-pay

## 2022-09-20 ENCOUNTER — Encounter (HOSPITAL_COMMUNITY): Payer: Self-pay | Admitting: Otolaryngology

## 2022-09-20 ENCOUNTER — Encounter (HOSPITAL_COMMUNITY)
Admission: RE | Disposition: A | Discharge status: Home / Self Care | Payer: Self-pay | Source: Home / Self Care | Attending: Otolaryngology

## 2022-09-20 ENCOUNTER — Ambulatory Visit (HOSPITAL_BASED_OUTPATIENT_CLINIC_OR_DEPARTMENT_OTHER): Payer: Medicare Other | Admitting: Anesthesiology

## 2022-09-20 ENCOUNTER — Observation Stay (HOSPITAL_COMMUNITY)
Admission: RE | Admit: 2022-09-20 | Discharge: 2022-09-21 | Disposition: A | Discharge status: Home / Self Care | Payer: Medicare Other | Attending: Otolaryngology | Admitting: Otolaryngology

## 2022-09-20 ENCOUNTER — Other Ambulatory Visit (HOSPITAL_COMMUNITY): Payer: Self-pay

## 2022-09-20 ENCOUNTER — Ambulatory Visit (HOSPITAL_COMMUNITY): Payer: Medicare Other | Admitting: Medical

## 2022-09-20 DIAGNOSIS — I110 Hypertensive heart disease with heart failure: Secondary | ICD-10-CM | POA: Insufficient documentation

## 2022-09-20 DIAGNOSIS — R1310 Dysphagia, unspecified: Secondary | ICD-10-CM | POA: Diagnosis not present

## 2022-09-20 DIAGNOSIS — E049 Nontoxic goiter, unspecified: Secondary | ICD-10-CM | POA: Diagnosis present

## 2022-09-20 DIAGNOSIS — Z91040 Latex allergy status: Secondary | ICD-10-CM | POA: Diagnosis not present

## 2022-09-20 DIAGNOSIS — I2510 Atherosclerotic heart disease of native coronary artery without angina pectoris: Secondary | ICD-10-CM | POA: Diagnosis not present

## 2022-09-20 DIAGNOSIS — E039 Hypothyroidism, unspecified: Secondary | ICD-10-CM | POA: Diagnosis not present

## 2022-09-20 DIAGNOSIS — I5022 Chronic systolic (congestive) heart failure: Secondary | ICD-10-CM | POA: Diagnosis present

## 2022-09-20 DIAGNOSIS — E042 Nontoxic multinodular goiter: Secondary | ICD-10-CM | POA: Diagnosis not present

## 2022-09-20 DIAGNOSIS — Z7984 Long term (current) use of oral hypoglycemic drugs: Secondary | ICD-10-CM | POA: Diagnosis not present

## 2022-09-20 DIAGNOSIS — E119 Type 2 diabetes mellitus without complications: Secondary | ICD-10-CM

## 2022-09-20 DIAGNOSIS — R32 Unspecified urinary incontinence: Secondary | ICD-10-CM | POA: Diagnosis present

## 2022-09-20 DIAGNOSIS — Z950 Presence of cardiac pacemaker: Secondary | ICD-10-CM | POA: Diagnosis not present

## 2022-09-20 DIAGNOSIS — I5043 Acute on chronic combined systolic (congestive) and diastolic (congestive) heart failure: Secondary | ICD-10-CM | POA: Insufficient documentation

## 2022-09-20 DIAGNOSIS — Z79899 Other long term (current) drug therapy: Secondary | ICD-10-CM | POA: Diagnosis not present

## 2022-09-20 DIAGNOSIS — K219 Gastro-esophageal reflux disease without esophagitis: Secondary | ICD-10-CM | POA: Diagnosis present

## 2022-09-20 DIAGNOSIS — Z7901 Long term (current) use of anticoagulants: Secondary | ICD-10-CM | POA: Diagnosis not present

## 2022-09-20 DIAGNOSIS — I480 Paroxysmal atrial fibrillation: Secondary | ICD-10-CM | POA: Diagnosis present

## 2022-09-20 DIAGNOSIS — I509 Heart failure, unspecified: Secondary | ICD-10-CM | POA: Diagnosis not present

## 2022-09-20 DIAGNOSIS — G4733 Obstructive sleep apnea (adult) (pediatric): Secondary | ICD-10-CM | POA: Diagnosis present

## 2022-09-20 DIAGNOSIS — I272 Pulmonary hypertension, unspecified: Secondary | ICD-10-CM | POA: Diagnosis not present

## 2022-09-20 HISTORY — PX: THYROID LOBECTOMY: SHX420

## 2022-09-20 LAB — GLUCOSE, CAPILLARY
Glucose-Capillary: 117 mg/dL — ABNORMAL HIGH (ref 70–99)
Glucose-Capillary: 127 mg/dL — ABNORMAL HIGH (ref 70–99)
Glucose-Capillary: 219 mg/dL — ABNORMAL HIGH (ref 70–99)

## 2022-09-20 SURGERY — LOBECTOMY, THYROID
Anesthesia: General | Site: Neck | Laterality: Right

## 2022-09-20 MED ORDER — CHLORHEXIDINE GLUCONATE 0.12 % MT SOLN
OROMUCOSAL | Status: AC
  Administered 2022-09-20: 15 mL via OROMUCOSAL
  Filled 2022-09-20: qty 15

## 2022-09-20 MED ORDER — PHENYLEPHRINE HCL-NACL 20-0.9 MG/250ML-% IV SOLN
INTRAVENOUS | Status: DC | PRN
  Administered 2022-09-20: 15 ug/min via INTRAVENOUS

## 2022-09-20 MED ORDER — ACETAMINOPHEN 500 MG PO TABS
500.0000 mg | ORAL_TABLET | Freq: Four times a day (QID) | ORAL | Status: DC | PRN
  Administered 2022-09-20 – 2022-09-21 (×2): 1000 mg via ORAL
  Filled 2022-09-20 (×2): qty 2

## 2022-09-20 MED ORDER — DEXAMETHASONE SODIUM PHOSPHATE 10 MG/ML IJ SOLN
INTRAMUSCULAR | Status: DC | PRN
  Administered 2022-09-20: 5 mg via INTRAVENOUS

## 2022-09-20 MED ORDER — METHIMAZOLE 10 MG PO TABS
10.0000 mg | ORAL_TABLET | Freq: Every day | ORAL | Status: DC
  Administered 2022-09-21: 10 mg via ORAL
  Filled 2022-09-20 (×3): qty 1

## 2022-09-20 MED ORDER — FENTANYL CITRATE (PF) 250 MCG/5ML IJ SOLN
INTRAMUSCULAR | Status: AC
  Filled 2022-09-20: qty 5

## 2022-09-20 MED ORDER — LIDOCAINE-EPINEPHRINE 1 %-1:100000 IJ SOLN
INTRAMUSCULAR | Status: DC | PRN
  Administered 2022-09-20: 10 mL

## 2022-09-20 MED ORDER — KETOROLAC TROMETHAMINE 30 MG/ML IJ SOLN
INTRAMUSCULAR | Status: AC
  Filled 2022-09-20: qty 2

## 2022-09-20 MED ORDER — LOPERAMIDE HCL 2 MG PO CAPS: 2.0000 mg | ORAL_CAPSULE | Freq: Every day | ORAL | Status: DC | PRN

## 2022-09-20 MED ORDER — ONDANSETRON HCL 4 MG/2ML IJ SOLN
INTRAMUSCULAR | Status: AC
  Filled 2022-09-20: qty 10

## 2022-09-20 MED ORDER — ACETAMINOPHEN 500 MG PO TABS: 1000.0000 mg | ORAL_TABLET | Freq: Once | ORAL | Status: AC

## 2022-09-20 MED ORDER — PROPOFOL 10 MG/ML IV BOLUS
INTRAVENOUS | Status: DC | PRN
  Administered 2022-09-20: 80 mg via INTRAVENOUS

## 2022-09-20 MED ORDER — LACTATED RINGERS IV SOLN: INTRAVENOUS | Status: DC | PRN

## 2022-09-20 MED ORDER — METOPROLOL TARTRATE 50 MG PO TABS: 50.0000 mg | ORAL_TABLET | Freq: Two times a day (BID) | ORAL | Status: DC

## 2022-09-20 MED ORDER — FENTANYL CITRATE (PF) 100 MCG/2ML IJ SOLN: 25.0000 ug | INTRAMUSCULAR | Status: DC | PRN

## 2022-09-20 MED ORDER — EPINEPHRINE HCL (NASAL) 0.1 % NA SOLN
NASAL | Status: AC
  Filled 2022-09-20: qty 30

## 2022-09-20 MED ORDER — HYDROCODONE-ACETAMINOPHEN 5-325 MG PO TABS
1.0000 | ORAL_TABLET | Freq: Four times a day (QID) | ORAL | 0 refills | Status: AC | PRN
  Filled 2022-09-20: qty 20, 5d supply, fill #0

## 2022-09-20 MED ORDER — LACTATED RINGERS IV SOLN: INTRAVENOUS | Status: DC

## 2022-09-20 MED ORDER — DAPAGLIFLOZIN PROPANEDIOL 10 MG PO TABS
10.0000 mg | ORAL_TABLET | Freq: Every day | ORAL | Status: DC
  Filled 2022-09-20: qty 1

## 2022-09-20 MED ORDER — HYDROCODONE-ACETAMINOPHEN 5-325 MG PO TABS
1.0000 | ORAL_TABLET | Freq: Four times a day (QID) | ORAL | Status: DC | PRN
  Administered 2022-09-20: 1 via ORAL
  Administered 2022-09-21: 2 via ORAL
  Filled 2022-09-20: qty 1
  Filled 2022-09-20: qty 2

## 2022-09-20 MED ORDER — OXYBUTYNIN CHLORIDE 5 MG PO TABS
5.0000 mg | ORAL_TABLET | Freq: Every day | ORAL | Status: DC
  Administered 2022-09-21: 5 mg via ORAL
  Filled 2022-09-20: qty 1

## 2022-09-20 MED ORDER — POTASSIUM CHLORIDE CRYS ER 20 MEQ PO TBCR: 20.0000 meq | EXTENDED_RELEASE_TABLET | Freq: Two times a day (BID) | ORAL | Status: DC

## 2022-09-20 MED ORDER — LINAGLIPTIN 5 MG PO TABS
5.0000 mg | ORAL_TABLET | Freq: Every day | ORAL | Status: DC
  Administered 2022-09-20: 5 mg via ORAL
  Filled 2022-09-20: qty 1

## 2022-09-20 MED ORDER — OXIDIZED CELLULOSE EX PADS
MEDICATED_PAD | CUTANEOUS | Status: DC | PRN
  Administered 2022-09-20: 1 via TOPICAL

## 2022-09-20 MED ORDER — INSULIN ASPART 100 UNIT/ML IJ SOLN: 0.0000 [IU] | Freq: Three times a day (TID) | INTRAMUSCULAR | Status: DC

## 2022-09-20 MED ORDER — ONDANSETRON HCL 4 MG/2ML IJ SOLN
INTRAMUSCULAR | Status: DC | PRN
  Administered 2022-09-20: 4 mg via INTRAVENOUS

## 2022-09-20 MED ORDER — METOPROLOL TARTRATE 25 MG PO TABS
25.0000 mg | ORAL_TABLET | Freq: Every evening | ORAL | Status: DC
  Administered 2022-09-20: 25 mg via ORAL
  Filled 2022-09-20: qty 1

## 2022-09-20 MED ORDER — ATROPINE SULFATE 0.4 MG/ML IV SOLN
INTRAVENOUS | Status: AC
  Filled 2022-09-20: qty 1

## 2022-09-20 MED ORDER — METOPROLOL TARTRATE 50 MG PO TABS
50.0000 mg | ORAL_TABLET | Freq: Every morning | ORAL | Status: DC
  Filled 2022-09-20: qty 1

## 2022-09-20 MED ORDER — PROPOFOL 500 MG/50ML IV EMUL
INTRAVENOUS | Status: DC | PRN
  Administered 2022-09-20: 25 ug/kg/min via INTRAVENOUS

## 2022-09-20 MED ORDER — IBUPROFEN 600 MG PO TABS: 600.0000 mg | ORAL_TABLET | Freq: Four times a day (QID) | ORAL | Status: DC | PRN

## 2022-09-20 MED ORDER — OXYCODONE HCL 5 MG/5ML PO SOLN: 5.0000 mg | Freq: Once | ORAL | Status: DC | PRN

## 2022-09-20 MED ORDER — LIDOCAINE-EPINEPHRINE 1 %-1:100000 IJ SOLN
INTRAMUSCULAR | Status: AC
  Filled 2022-09-20: qty 1

## 2022-09-20 MED ORDER — AMISULPRIDE (ANTIEMETIC) 5 MG/2ML IV SOLN
INTRAVENOUS | Status: AC
  Filled 2022-09-20: qty 4

## 2022-09-20 MED ORDER — ORAL CARE MOUTH RINSE: 15.0000 mL | Freq: Once | OROMUCOSAL | Status: AC

## 2022-09-20 MED ORDER — 0.9 % SODIUM CHLORIDE (POUR BTL) OPTIME
TOPICAL | Status: DC | PRN
  Administered 2022-09-20: 2000 mL

## 2022-09-20 MED ORDER — LIDOCAINE 2% (20 MG/ML) 5 ML SYRINGE
INTRAMUSCULAR | Status: DC | PRN
  Administered 2022-09-20: 80 mg via INTRAVENOUS

## 2022-09-20 MED ORDER — SILDENAFIL CITRATE 20 MG PO TABS
20.0000 mg | ORAL_TABLET | Freq: Three times a day (TID) | ORAL | Status: DC
  Administered 2022-09-20 – 2022-09-21 (×2): 20 mg via ORAL
  Filled 2022-09-20 (×4): qty 1

## 2022-09-20 MED ORDER — PANTOPRAZOLE SODIUM 40 MG PO TBEC
40.0000 mg | DELAYED_RELEASE_TABLET | Freq: Every day | ORAL | Status: DC
  Administered 2022-09-21: 40 mg via ORAL
  Filled 2022-09-20: qty 1

## 2022-09-20 MED ORDER — FENTANYL CITRATE (PF) 250 MCG/5ML IJ SOLN
INTRAMUSCULAR | Status: DC | PRN
  Administered 2022-09-20 (×2): 50 ug via INTRAVENOUS
  Administered 2022-09-20: 25 ug via INTRAVENOUS

## 2022-09-20 MED ORDER — DOCUSATE SODIUM 100 MG PO CAPS: 100.0000 mg | ORAL_CAPSULE | Freq: Two times a day (BID) | ORAL | Status: DC | PRN

## 2022-09-20 MED ORDER — AMISULPRIDE (ANTIEMETIC) 5 MG/2ML IV SOLN
10.0000 mg | Freq: Once | INTRAVENOUS | Status: AC | PRN
  Administered 2022-09-20: 10 mg via INTRAVENOUS

## 2022-09-20 MED ORDER — HYDROCODONE-ACETAMINOPHEN 5-325 MG PO TABS
ORAL_TABLET | ORAL | Status: AC
  Administered 2022-09-20: 1 via ORAL
  Filled 2022-09-20: qty 1

## 2022-09-20 MED ORDER — CEFAZOLIN SODIUM-DEXTROSE 2-4 GM/100ML-% IV SOLN
INTRAVENOUS | Status: AC
  Filled 2022-09-20: qty 100

## 2022-09-20 MED ORDER — DEXMEDETOMIDINE HCL IN NACL 80 MCG/20ML IV SOLN
INTRAVENOUS | Status: AC
  Filled 2022-09-20: qty 40

## 2022-09-20 MED ORDER — OXYCODONE HCL 5 MG PO TABS: 5.0000 mg | ORAL_TABLET | Freq: Once | ORAL | Status: DC | PRN

## 2022-09-20 MED ORDER — CEFAZOLIN SODIUM-DEXTROSE 2-4 GM/100ML-% IV SOLN
2.0000 g | INTRAVENOUS | Status: AC
  Administered 2022-09-20: 2 g via INTRAVENOUS

## 2022-09-20 MED ORDER — ACETAMINOPHEN 500 MG PO TABS
ORAL_TABLET | ORAL | Status: AC
  Administered 2022-09-20: 1000 mg via ORAL
  Filled 2022-09-20: qty 2

## 2022-09-20 MED ORDER — MIDAZOLAM HCL 2 MG/2ML IJ SOLN
INTRAMUSCULAR | Status: AC
  Filled 2022-09-20: qty 2

## 2022-09-20 MED ORDER — DEXAMETHASONE SODIUM PHOSPHATE 10 MG/ML IJ SOLN
INTRAMUSCULAR | Status: AC
  Filled 2022-09-20: qty 4

## 2022-09-20 MED ORDER — FUROSEMIDE 40 MG PO TABS
80.0000 mg | ORAL_TABLET | Freq: Two times a day (BID) | ORAL | Status: DC
  Administered 2022-09-20 – 2022-09-21 (×2): 80 mg via ORAL
  Filled 2022-09-20 (×2): qty 2

## 2022-09-20 MED ORDER — SUCCINYLCHOLINE CHLORIDE 200 MG/10ML IV SOSY
PREFILLED_SYRINGE | INTRAVENOUS | Status: DC | PRN
  Administered 2022-09-20: 100 mg via INTRAVENOUS

## 2022-09-20 MED ORDER — SPIRONOLACTONE 25 MG PO TABS
25.0000 mg | ORAL_TABLET | Freq: Every day | ORAL | Status: DC
  Administered 2022-09-21: 25 mg via ORAL
  Filled 2022-09-20: qty 1

## 2022-09-20 MED ORDER — EPINEPHRINE HCL (NASAL) 0.1 % NA SOLN
NASAL | Status: DC | PRN
  Administered 2022-09-20: 10 mL via NASAL

## 2022-09-20 MED ORDER — SUCCINYLCHOLINE CHLORIDE 200 MG/10ML IV SOSY
PREFILLED_SYRINGE | INTRAVENOUS | Status: AC
  Filled 2022-09-20: qty 20

## 2022-09-20 MED ORDER — CHLORHEXIDINE GLUCONATE 0.12 % MT SOLN: 15.0000 mL | Freq: Once | OROMUCOSAL | Status: AC

## 2022-09-20 SURGICAL SUPPLY — 52 items
ADH SKN CLS APL DERMABOND .7 (GAUZE/BANDAGES/DRESSINGS) ×2
BAG COUNTER SPONGE SURGICOUNT (BAG) ×3 IMPLANT
BAG SPNG CNTER NS LX DISP (BAG) ×2
BLADE SURG 15 STRL LF DISP TIS (BLADE) ×2 IMPLANT
BLADE SURG 15 STRL SS (BLADE) ×2
CANISTER SUCT 3000ML PPV (MISCELLANEOUS) ×3 IMPLANT
CNTNR URN SCR LID CUP LEK RST (MISCELLANEOUS) ×1 IMPLANT
CONT SPEC 4OZ STRL OR WHT (MISCELLANEOUS) ×2
CORD BIPOLAR FORCEPS 12FT (ELECTRODE) ×3 IMPLANT
COVER SURGIBOOT TRANSDUCER 6 (DISPOSABLE) ×1 IMPLANT
COVER SURGICAL LIGHT HANDLE (MISCELLANEOUS) ×3 IMPLANT
DERMABOND ADVANCED .7 DNX12 (GAUZE/BANDAGES/DRESSINGS) ×2 IMPLANT
DRAIN JACKSON RD 7FR 3/32 (WOUND CARE) IMPLANT
DRAIN JP 10F RND RADIO (DRAIN) ×1 IMPLANT
DRAPE HALF SHEET 40X57 (DRAPES) IMPLANT
DRAPE POUCH INSTRU U-SHP 10X18 (DRAPES) ×1 IMPLANT
ELECT COATED BLADE 2.86 ST (ELECTRODE) ×2 IMPLANT
ELECT REM PT RETURN 9FT ADLT (ELECTROSURGICAL) ×2
ELECTRODE REM PT RTRN 9FT ADLT (ELECTROSURGICAL) ×3 IMPLANT
EVACUATOR SILICONE 100CC (DRAIN) IMPLANT
FORCEPS BIPOLAR SPETZLER 8 1.0 (NEUROSURGERY SUPPLIES) ×2 IMPLANT
GAUZE 4X4 16PLY ~~LOC~~+RFID DBL (SPONGE) ×6 IMPLANT
GLOVE BIO SURGEON STRL SZ 6.5 (GLOVE) ×1 IMPLANT
GOWN STRL REUS W/ TWL LRG LVL3 (GOWN DISPOSABLE) ×6 IMPLANT
GOWN STRL REUS W/TWL LRG LVL3 (GOWN DISPOSABLE) ×4
HEMOSTAT SURGICEL 2X14 (HEMOSTASIS) ×3 IMPLANT
KIT BASIN OR (CUSTOM PROCEDURE TRAY) ×2 IMPLANT
KIT TURNOVER KIT B (KITS) ×2 IMPLANT
LOCATOR NERVE 3 VOLT (DISPOSABLE) IMPLANT
MARKER SKIN DUAL TIP RULER LAB (MISCELLANEOUS) ×1 IMPLANT
NDL HYPO 25GX1X1/2 BEV (NEEDLE) ×1 IMPLANT
NEEDLE HYPO 25GX1X1/2 BEV (NEEDLE) ×2 IMPLANT
NS IRRIG 1000ML POUR BTL (IV SOLUTION) ×2 IMPLANT
PAD ARMBOARD 7.5X6 YLW CONV (MISCELLANEOUS) ×6 IMPLANT
PATTIES SURGICAL .5 X.5 (GAUZE/BANDAGES/DRESSINGS) ×1 IMPLANT
PENCIL SMOKE EVACUATOR (MISCELLANEOUS) ×2 IMPLANT
POSITIONER HEAD DONUT 9IN (MISCELLANEOUS) ×1 IMPLANT
PROBE NERVBE PRASS .33 (MISCELLANEOUS) ×3 IMPLANT
SET WALTER ACTIVATION W/DRAPE (SET/KITS/TRAYS/PACK) ×2 IMPLANT
SHEARS HARMONIC 9CM CVD (BLADE) ×3 IMPLANT
SPONGE INTESTINAL PEANUT (DISPOSABLE) ×5 IMPLANT
STAPLER VISISTAT 35W (STAPLE) IMPLANT
SUT CHROMIC 4 0 PS 2 18 (SUTURE) IMPLANT
SUT MNCRL AB 4-0 PS2 18 (SUTURE) ×3 IMPLANT
SUT SILK 3 0 PS 1 (SUTURE) ×1 IMPLANT
SUT SILK 3 0 REEL (SUTURE) ×2 IMPLANT
SUT SILK 3 0SH CR/8 30 (SUTURE) ×2 IMPLANT
SUT VIC AB 3-0 SH 27 (SUTURE) ×6
SUT VIC AB 3-0 SH 27X BRD (SUTURE) ×4 IMPLANT
SUT VICRYL 4-0 PS2 18IN ABS (SUTURE) ×3 IMPLANT
TOWEL GREEN STERILE FF (TOWEL DISPOSABLE) ×2 IMPLANT
TRAY ENT MC OR (CUSTOM PROCEDURE TRAY) ×2 IMPLANT

## 2022-09-20 NOTE — Progress Notes (Signed)
Pt said does not want to wear cpap do to surgery she had on her neck. Pt has a home cpap at bedside.

## 2022-09-20 NOTE — Progress Notes (Signed)
Received patient from PACU s/p thyroidectomy, alert and oriented x4, VSS, room air. Transverse incision to neck, attached with skin glue. JP drain to right neck charged with minimal output. Orders reviewed, skin assessed. Patient voided, ambulated to bathroom x1 assist with walker. Unit orientation and safety measures implemented. , Plan of care discussed, pt verbalized understanding. Patient remains in NAD. Handoff to night shift nurse.

## 2022-09-20 NOTE — H&P (Addendum)
Hailey [name] is an 77 y.o. female.    Chief Complaint:  Thyroid goiter  HPI: Patient presents today for planned elective procedure.  She denies any interval change in history since office visit on 06/24/2022:  Hailey [name] is a 77 y.o. female who presents as a return patient for discussion of thyroidectomy. Patient was seen by Dr. Emeline Darling on 05/06/2022 in consultation for her thyroid goiter, and her case was discussed during the multidisciplinary tumor board on 05/15/2022. Consensus from tumor board discussion was to proceed with surgery, starting with right hemithyroidectomy and proceeding to completion thyroidectomy if surgery proceeds without complication. She denies any change in her symptoms since last visit in June. She continues to experience mild discomfort on the right side of her neck as well as dysphagia to pills and bulky food items.   Past Medical History:  Diagnosis Date   Acute on chronic diastolic CHF (congestive heart failure), NYHA class 1 04/29/2013   Allergic rhinitis    Brady-tachy syndrome    s/p STJ dual chamber PPM by Dr [name]   CAD (coronary artery disease)    70% RCA stenosis, treated medically   Control of atrial fibrillation with pacemaker    Diabetes mellitus    type 2   DJD (degenerative joint disease)    Dyspnea    occasional   Hearing loss    Right ear only - no hearing aids   Hypertension    Hyperthyroidism    Paroxysmal atrial fibrillation    Pacemaker [employer]   Pneumonia    years ago- before 2012 per patient   Polio    as a child   PONV (postoperative nausea and vomiting)    Presence of permanent cardiac pacemaker    St Jude pacemaker   Pulmonary disease    Sleep apnea    uses  CPAP nightly    Past Surgical History:  Procedure Laterality Date   arm surgery d/t fx Right 1996   ATRIAL FIBRILLATION ABLATION  07/31/2012; 08-10-13   PVI by Dr [name]   ATRIAL FIBRILLATION ABLATION N/A 07/31/2012   Procedure: ATRIAL FIBRILLATION ABLATION;   Surgeon: Hillis Range, MD;  Location: [facility] CATH LAB;  Service: Cardiovascular;  Laterality: N/A;   ATRIAL FIBRILLATION ABLATION N/A 08/10/2013   Procedure: ATRIAL FIBRILLATION ABLATION;  Surgeon: Gardiner Rhyme, MD;  Location: [facility] CATH LAB;  Service: Cardiovascular;  Laterality: N/A;   BACK SURGERY  07/26/2012   [facility]R. [facility]   CARDIAC CATHETERIZATION  12/03/2010   single vessel mid RCA   CARDIAC ELECTROPHYSIOLOGY MAPPING AND ABLATION  07/31/2012   Dr. Hillis Range   CESAREAN SECTION  09/20/1974   Dr. Greta Doom   CHOLECYSTECTOMY  1994   COLONOSCOPY WITH PROPOFOL N/A 03/21/2015   Procedure: COLONOSCOPY WITH PROPOFOL;  Surgeon: Charna Elizabeth, MD;  Location: [facility] ENDOSCOPY;  Service: Endoscopy;  Laterality: N/A;   DIAGNOSTIC MAMMOGRAM  04/16/2017   Tylene Fantasia. Morris, DO   EYE SURGERY Bilateral    remove cataracts   LAPAROSCOPIC HYSTERECTOMY  Late 82s to early 30s   LEFT HEART CATH AND CORONARY ANGIOGRAPHY N/A 07/30/2017   Procedure: LEFT HEART CATH AND CORONARY ANGIOGRAPHY;  Surgeon: Lennette Bihari, MD;  Location: [facility] INVASIVE CV LAB;  Service: Cardiovascular;  Laterality: N/A;   LUMBAR LAMINECTOMY/DECOMPRESSION MICRODISCECTOMY Right 12/23/2012   Procedure: Right L5-S1 Laminotomy for resection of synovial cyst;  Surgeon: [name], MD;  Location: [facility] NEURO ORS;  Service: Neurosurgery;  Laterality: Right;  Right Lumbar five-sacral one  Laminotomy for resection of synovial cyst   NM MYOCAR PERF WALL MOTION  12/20/2010   normal   PACEMAKER IMPLANT N/A 08/09/2019   Procedure: PACEMAKER IMPLANT;  Surgeon: [name], MD;  Location: [facility] INVASIVE CV LAB;  Service: Cardiovascular;  Laterality: N/A;   PACEMAKER INSERTION  12/03/2010   SJM Accent DR RF implanted by Dr [name]   RIGHT HEART CATH N/A 04/22/2018   Procedure: RIGHT HEART CATH;  Surgeon: Iran Ouch, MD;  Location: [facility] INVASIVE CV LAB;  Service: Cardiovascular;  Laterality: N/A;   RIGHT HEART CATH N/A 07/09/2022   Procedure: RIGHT  HEART CATH;  Surgeon: Dolores Patty, MD;  Location: [facility] INVASIVE CV LAB;  Service: Cardiovascular;  Laterality: N/A;   TEE WITHOUT CARDIOVERSION N/A 07/30/2012   Procedure: TRANSESOPHAGEAL ECHOCARDIOGRAM (TEE);  Surgeon: [name], MD;  Location: [facility] ENDOSCOPY;  Service: Cardiovascular;  Laterality: N/A;  h&p in file-hope   TEE WITHOUT CARDIOVERSION N/A 08/09/2013   Procedure: TRANSESOPHAGEAL ECHOCARDIOGRAM (TEE);  Surgeon: Lewayne Bunting, MD;  Location: [facility] ENDOSCOPY;  Service: Cardiovascular;  Laterality: N/A;   TOTAL ABDOMINAL HYSTERECTOMY  1981   ULTRASOUND GUIDANCE FOR VASCULAR ACCESS  04/22/2018   Procedure: Ultrasound Guidance For Vascular Access;  Surgeon: Iran Ouch, MD;  Location: [facility] INVASIVE CV LAB;  Service: Cardiovascular;;    Family History  Problem Relation Age of Onset   Cancer Brother    Heart attack Sister    Breast cancer Sister    Diabetes Sister    Heart disease Sister    Breast cancer Sister    Dementia Sister    Colon cancer Sister    Heart disease Sister    Breast cancer Sister    Suicidality Brother    Prostate cancer Brother    Dementia Brother     Social History:  reports that she has never smoked. She has never used smokeless tobacco. She reports that she does not drink alcohol and does not use drugs.  Allergies:  Allergies  Allergen Reactions   Codeine Nausea And Vomiting and Nausea Only   Demerol Hcl [Meperidine] Nausea Only   Ketorolac Nausea And Vomiting, Nausea Only and Other (See Comments)    "pt told to never take this med"   Demerol Nausea And Vomiting   Multaq [Dronedarone] Other (See Comments)    Increased fluid retention   Flexeril [Cyclobenzaprine] Nausea And Vomiting    Medications Prior to Admission  Medication Sig Dispense Refill   Cholecalciferol (VITAMIN D3) 1000 UNITS CAPS Take 1,000 Units by mouth in the morning.     Cranberry 500 MG CAPS Take 500 mg by mouth daily as needed (Yeast infection).      Cyanocobalamin (VITAMIN B12) 500 MCG TABS Take 500 mg by mouth 2 (two) times a week.     dapagliflozin propanediol (FARXIGA) 10 MG TABS tablet Take 1 tablet (10 mg total) by mouth daily before breakfast. 90 tablet 6   ELIQUIS 5 MG TABS tablet TAKE ONE TABLET BY MOUTH TWICE DAILY 60 tablet 11   furosemide (LASIX) 80 MG tablet Take 1 tablet (80 mg total) by mouth 2 (two) times daily. 135 tablet 1   loperamide (IMODIUM) 2 MG capsule Take 2 mg by mouth daily as needed for diarrhea or loose stools.     methimazole (TAPAZOLE) 10 MG tablet TAKE 1 TABLET BY MOUTH ONCE DAILY WITH FOOD FOR THYROID 90 tablet 1   metoprolol tartrate (LOPRESSOR) 50 MG tablet TAKE 1 TABLET BY MOUTH ONCE DAILY  IN THE MORNING THEN TAKE 1/2 (ONE-HALF) TABLET ONCE DAILY IN THE EVENING 135 tablet 2   ondansetron (ZOFRAN) 4 MG tablet Take 1 tablet (4 mg total) by mouth every 8 (eight) hours as needed for nausea or vomiting. 20 tablet 0   oxybutynin (DITROPAN) 5 MG tablet Take 1 tablet (5 mg total) by mouth daily. 90 tablet 0   pantoprazole (PROTONIX) 40 MG tablet Take 1 tablet (40 mg total) by mouth daily. 90 tablet 3   potassium chloride SA (KLOR-CON M) 20 MEQ tablet Take 1 tablet (20 mEq total) by mouth 2 (two) times daily. 180 tablet 1   sildenafil (REVATIO) 20 MG tablet Take 1 tablet (20 mg total) by mouth 3 (three) times daily. 90 tablet 5   sitaGLIPtin (JANUVIA) 100 MG tablet Take 1 tablet by mouth once daily 90 tablet 1   spironolactone (ALDACTONE) 25 MG tablet Take 1 tablet (25 mg total) by mouth daily. 90 tablet 3   acetaminophen (TYLENOL) 500 MG tablet Take 500-1,000 mg by mouth every 6 (six) hours as needed (pain.).     glucose blood (ONETOUCH ULTRA) test strip USE DAILY TO CHECK BLOOD SUGAR E11.8 100 each 11   Lancets Misc. (ONE TOUCH SURESOFT) MISC Use to test  Blood sugar once daily DX: E11.8, 1 each 0    Results for orders placed or performed during the hospital encounter of 09/20/22 (from the past 48 hour(s))   Glucose, capillary     Status: Abnormal   Collection Time: 09/20/22  8:41 AM  Result Value Ref Range   Glucose-Capillary 117 (H) 70 - 99 mg/dL    Comment: Glucose reference range applies only to samples taken after fasting for at least 8 hours.   No results found.  ROS: ROS  Blood pressure (!) 143/74, pulse 63, temperature 99.2 F (37.3 C), temperature source Oral, resp. rate 20, height  (1.499 m), weight 54.9 kg, SpO2 98 %.  PHYSICAL EXAM: Physical Exam Constitutional:      Appearance: Normal appearance.  Neck:     Thyroid: Thyromegaly present.     Comments: Multinodular goiter Pulmonary:     Effort: Pulmonary effort is normal.  Neurological:     General: No focal deficit present.     Mental Status: She is alert and oriented to person, place, and time.     Studies Reviewed: CT Neck reviewed   Assessment/Plan Yolander Goodie is a 76 y.o. female with history of persistent atrial fibrillation with pacemaker, chronic anticoagulation on Eliquis, chronic diastolic CHF, CAD, hypertension, restrictive lung disease, hyperthyroidism on methimazole presenting with large thyroid goiter.   To OR today for total thyroidectomy. I undertook a detailed discussion with the patient and her daughter with respect to surgery, including the risks and benefits. All treatment options were reviewed in depth.  The risks include but are not limited to: hypocalcemia, recurrent laryngeal nerve injury and its resultant effect on the voice and breathing and swallowing, intra-operative and post operative bleeding, infection, scarring, neck stiffness, pain, seroma, hematoma, numbness of the surgical site and surrounding skin, need for further surgery, need for adjunctive treatments, such as radioactive iodine, need for life-long daily thyroid supplementation, need for tracheostomy, intubation or secondary airway surgery.  All questions were answered. The patient voiced understanding of all the risks and  benefits of the above-specified treatment plan and then willingly consented to the surgery.   Addendum: CT Surgery not available for case d/t miscommunication. Discussed options with patient and daughter, including cancelling  and rescheduling with CT surgery, vs proceeding with right hemithyroidectomy today. Risks,benefits of each option reviewed. Patient would like to proceed with right hemithyroidectomy, and plan for possible completion thyroidectomy in conjunction with CT surgery if symptoms persist and recovery is uneventful.   Khyrie Masi A Martyn Timme 09/20/2022, 10:04 AM

## 2022-09-20 NOTE — Consult Note (Signed)
Consult Note      [name]:[id number] DOB: [birth year] DOA: 09/20/2022  PCP: Hailey Kuster, MD  Patient coming from: home   I have personally briefly reviewed patient's old medical records in [facility]  Consulting Physician: Dr. Arrie [name] (ENT) Reason for Consult: Assistance with postoperative medical management of the patient's several chronic medical pathologies.  HPI: Hailey [name] is a 77 y.o. female with medical history significant for large thyroid goiter, hypothyroidism, paroxysmal atrial fibrillation c complicated by sick sinus syndrome status post pacemaker placement, currently anticoagulated on Eliquis, chronic right-sided systolic heart failure, type 2 diabetes mellitus, essential hypertension, obstructive sleep apnea on home nocturnal CPAP, who was admitted to to the ENT service at [facility] on 09/20/2022 for right hemithyroidectomy in the setting of thyroid goiter.   Following history is provided through my discussions with the patient as well as my discussion with Dr. Irven [name] of the primary, ENT service.   The patient has a history of large thyroid goiter associated hyperthyroidism.  In the setting of progressive neck discomfort as well as development of dysphagia, the decision was made to pursue right hemithyroidectomy, and the patient was admitted to [facility] earlier today for this purpose.  Underwent right hemithyroidectomy under general anesthesia with Dr. Irven [name] of ENT, without any report of significant intraoperative complications.  Recovered in PACU, and was transferred to the MedSurg floor.   The patient currently reports adequate pain control relating to her neck.  She denies any current chest pain, shortness of breath, palpitations, diaphoresis, nausea, vomiting.  denies any postoperative subjective fever, chills, rigors, or generalized myalgias.  Her medical history includes chronic right-sided systolic heart failure as well  as paroxysmal atrial fibrillation complicated by sick sinus syndrome status post pacemaker placement in 2021.  In this context, she has noted to be chronically anticoagulated on Eliquis, which was held preoperatively leading up to today's right hemithyroidectomy.  Per chart review, most recent echocardiogram occurred in January 2024 was notable for LVEF 66 5%, no focal motion maladies, normal left ventricular diastolic parameters, moderately reduced right ventricular systolic function, moderately elevated pulmonary artery systolic pressures, mildly dilated left atrium, moderately dilated right atrium, severe tricuspid regurgitation, and trivial aortic regurgitation.  She is on multiple diuretics as an outpatient, including Lasix as well as spironolactone.  She also carries a history of type 2 diabetes mellitus, with most recent hemoglobin A1c noted to be 6.3% on 09/16/2022.  She is not on exogenous insulin dependent as an outpatient, but rather is on multiple oral hypoglycemic agents at home in the form of Januvia and dapagliflozin.  She also has a history of obstructive sleep apnea, reporting good compliance with her home nocturnal CPAP.  Her postoperative vital signs been notable for the following: Afebrile, heart rates in the 60s to 70s; systolic blood pressures in the 120s to 140s; respiratory rate 16-20, oxygen saturation 96 to 100% on room air.  The patient has received the following notable postoperative medications this evening: Lopressor 25 mg p.o., Tradjenta, Lasix 80 mg p.o.  Her most recent CBG occurred around 1500 and was noted to be 127.  Additionally, preoperative labs were performed on 09/16/2022, notable for the following: BMP notable for creatinine 1.26.  CBC notable for hemoglobin 12.6.    Review of Systems: As per HPI otherwise 10 point review of systems negative.   Past Medical History:  Diagnosis Date   Acute on chronic diastolic CHF (congestive heart failure), NYHA class 1  04/29/2013   Allergic rhinitis    Brady-tachy syndrome    s/p STJ dual chamber PPM by Dr Hailey [name]   CAD (coronary artery disease)    70% RCA stenosis, treated medically   Control of atrial fibrillation with pacemaker    Diabetes mellitus    type 2   DJD (degenerative joint disease)    Dyspnea    occasional   Hearing loss    Right ear only - no hearing aids   Hypertension    Hyperthyroidism    Paroxysmal atrial fibrillation    Pacemaker [employer]   Pneumonia    years ago- before 2012 per patient   Polio    as a child   PONV (postoperative nausea and vomiting)    Presence of permanent cardiac pacemaker    St Jude pacemaker   Pulmonary disease    Sleep apnea    uses  CPAP nightly    Past Surgical History:  Procedure Laterality Date   arm surgery d/t fx Right 1996   ATRIAL FIBRILLATION ABLATION  07/31/2012; 08-10-13   PVI by Dr Hailey [name]   ATRIAL FIBRILLATION ABLATION N/A 07/31/2012   Procedure: ATRIAL FIBRILLATION ABLATION;  Surgeon: Hillis Range, MD;  Location: [facility] CATH LAB;  Service: Cardiovascular;  Laterality: N/A;   ATRIAL FIBRILLATION ABLATION N/A 08/10/2013   Procedure: ATRIAL FIBRILLATION ABLATION;  Surgeon: Hailey Rhyme, MD;  Location: [facility] CATH LAB;  Service: Cardiovascular;  Laterality: N/A;   BACK SURGERY  07/26/2012   dR. [facility]   CARDIAC CATHETERIZATION  12/03/2010   single vessel mid RCA   CARDIAC ELECTROPHYSIOLOGY MAPPING AND ABLATION  07/31/2012   Dr. Hillis [name]   CESAREAN SECTION  09/20/1974   Dr. Greta [name]   CHOLECYSTECTOMY  1994   COLONOSCOPY WITH PROPOFOL N/A 03/21/2015   Procedure: COLONOSCOPY WITH PROPOFOL;  Surgeon: Hailey Elizabeth, MD;  Location: [facility] ENDOSCOPY;  Service: Endoscopy;  Laterality: N/A;   DIAGNOSTIC MAMMOGRAM  04/16/2017   Hailey Fantasia. Morris, DO   EYE SURGERY Bilateral    remove cataracts   LAPAROSCOPIC HYSTERECTOMY  Late 6s to early 17s   LEFT HEART CATH AND CORONARY ANGIOGRAPHY N/A 07/30/2017   Procedure: LEFT HEART CATH AND CORONARY  ANGIOGRAPHY;  Surgeon: Hailey Bihari, MD;  Location: [facility] INVASIVE CV LAB;  Service: Cardiovascular;  Laterality: N/A;   LUMBAR LAMINECTOMY/DECOMPRESSION MICRODISCECTOMY Right 12/23/2012   Procedure: Right L5-S1 Laminotomy for resection of synovial cyst;  Surgeon: [name], MD;  Location: [facility] NEURO ORS;  Service: Neurosurgery;  Laterality: Right;  Right Lumbar five-sacral one Laminotomy for resection of synovial cyst   NM MYOCAR PERF WALL MOTION  12/20/2010   normal   PACEMAKER IMPLANT N/A 08/09/2019   Procedure: PACEMAKER IMPLANT;  Surgeon: [name], MD;  Location: [facility] INVASIVE CV LAB;  Service: Cardiovascular;  Laterality: N/A;   PACEMAKER INSERTION  12/03/2010   SJM Accent DR RF implanted by Dr Hailey [name]   RIGHT HEART CATH N/A 04/22/2018   Procedure: RIGHT HEART CATH;  Surgeon: Iran Ouch, MD;  Location: [facility] INVASIVE CV LAB;  Service: Cardiovascular;  Laterality: N/A;   RIGHT HEART CATH N/A 07/09/2022   Procedure: RIGHT HEART CATH;  Surgeon: Dolores Patty, MD;  Location: [facility] INVASIVE CV LAB;  Service: Cardiovascular;  Laterality: N/A;   TEE WITHOUT CARDIOVERSION N/A 07/30/2012   Procedure: TRANSESOPHAGEAL ECHOCARDIOGRAM (TEE);  Surgeon: [name], MD;  Location: [facility] ENDOSCOPY;  Service: Cardiovascular;  Laterality: N/A;  h&p in file-hope   TEE WITHOUT CARDIOVERSION N/A  08/09/2013   Procedure: TRANSESOPHAGEAL ECHOCARDIOGRAM (TEE);  Surgeon: Lewayne Bunting, MD;  Location: [facility] ENDOSCOPY;  Service: Cardiovascular;  Laterality: N/A;   TOTAL ABDOMINAL HYSTERECTOMY  1981   ULTRASOUND GUIDANCE FOR VASCULAR ACCESS  04/22/2018   Procedure: Ultrasound Guidance For Vascular Access;  Surgeon: Iran Ouch, MD;  Location: [facility] INVASIVE CV LAB;  Service: Cardiovascular;;    Social History:  reports that she has never smoked. She has never used smokeless tobacco. She reports that she does not drink alcohol and does not use drugs.   Allergies  Allergen Reactions    Codeine Nausea And Vomiting and Nausea Only   Demerol Hcl [Meperidine] Nausea Only   Ketorolac Nausea And Vomiting, Nausea Only and Other (See Comments)    "pt told to never take this med"   Demerol Nausea And Vomiting   Multaq [Dronedarone] Other (See Comments)    Increased fluid retention   Flexeril [Cyclobenzaprine] Nausea And Vomiting   Latex Rash    Pt stated on 09/20/2022    Family History  Problem Relation Age of Onset   Cancer Brother    Heart attack Sister    Breast cancer Sister    Diabetes Sister    Heart disease Sister    Breast cancer Sister    Dementia Sister    Colon cancer Sister    Heart disease Sister    Breast cancer Sister    Suicidality Brother    Prostate cancer Brother    Dementia Brother     Family history reviewed and not pertinent    Prior to Admission medications   Medication Sig Start Date End Date Taking? Authorizing Provider  Cholecalciferol (VITAMIN D3) 1000 UNITS CAPS Take 1,000 Units by mouth in the morning.   Yes [provider]  Cranberry 500 MG CAPS Take 500 mg by mouth daily as needed (Yeast infection).   Yes [provider]  Cyanocobalamin (VITAMIN B12) 500 MCG TABS Take 500 mg by mouth 2 (two) times a week.   Yes [provider]  dapagliflozin propanediol (FARXIGA) 10 MG TABS tablet Take 1 tablet (10 mg total) by mouth daily before breakfast. 07/17/22  Yes Croitoru, Mihai, MD  ELIQUIS 5 MG TABS tablet TAKE ONE TABLET BY MOUTH TWICE DAILY 07/12/22  Yes Croitoru, Mihai, MD  furosemide (LASIX) 80 MG tablet Take 1 tablet (80 mg total) by mouth 2 (two) times daily. 07/10/22  Yes Croitoru, Mihai, MD  HYDROcodone-acetaminophen (NORCO/VICODIN) 5-325 MG tablet Take 1 tablet by mouth every 6 (six) hours as needed for up to 5 days for moderate pain. 09/20/22 09/25/22 Yes Skotnicki, Meghan A, DO  loperamide (IMODIUM) 2 MG capsule Take 2 mg by mouth daily as needed for diarrhea or loose stools.   Yes [provider]   methimazole (TAPAZOLE) 10 MG tablet TAKE 1 TABLET BY MOUTH ONCE DAILY WITH FOOD FOR THYROID 05/07/22  Yes Hailey Kuster, MD  metoprolol tartrate (LOPRESSOR) 50 MG tablet TAKE 1 TABLET BY MOUTH ONCE DAILY IN THE MORNING THEN TAKE 1/2 (ONE-HALF) TABLET ONCE DAILY IN THE EVENING 08/20/22  Yes Croitoru, Mihai, MD  ondansetron (ZOFRAN) 4 MG tablet Take 1 tablet (4 mg total) by mouth every 8 (eight) hours as needed for nausea or vomiting. 05/22/21  Yes Hailey Kuster, MD  oxybutynin (DITROPAN) 5 MG tablet Take 1 tablet (5 mg total) by mouth daily. 08/26/22  Yes Hailey Kuster, MD  pantoprazole (PROTONIX) 40 MG tablet Take 1 tablet (40 mg total) by  mouth daily. 07/22/22  Yes Hailey Kuster, MD  potassium chloride SA (KLOR-CON M) 20 MEQ tablet Take 1 tablet (20 mEq total) by mouth 2 (two) times daily. 06/06/22  Yes Croitoru, Mihai, MD  sildenafil (REVATIO) 20 MG tablet Take 1 tablet (20 mg total) by mouth 3 (three) times daily. 07/22/22  Yes Croitoru, Rachelle Hora, MD  sitaGLIPtin (JANUVIA) 100 MG tablet Take 1 tablet by mouth once daily 02/20/22  Yes Hailey Kuster, MD  spironolactone (ALDACTONE) 25 MG tablet Take 1 tablet (25 mg total) by mouth daily. 07/26/22 07/21/23 Yes Jodelle Gross, NP  acetaminophen (TYLENOL) 500 MG tablet Take 500-1,000 mg by mouth every 6 (six) hours as needed (pain.).    [provider]  glucose blood (ONETOUCH ULTRA) test strip USE DAILY TO CHECK BLOOD SUGAR E11.8 05/29/20   Reed, Tiffany L, DO  Lancets Misc. (ONE TOUCH SURESOFT) MISC Use to test  Blood sugar once daily DX: E11.8, 06/14/19   Kermit Balo, DO     Objective    Physical Exam: Vitals:   09/20/22 1630 09/20/22 1645 09/20/22 1703 09/20/22 2003  BP: 134/67 133/61 (!) 143/57 (!) 108/57  Pulse: 64 65 66 60  Resp: Temp: 97.7 F (36.5 C)  98.5 F (36.9 C) 97.7 F (36.5 C)  TempSrc:   Oral Oral  SpO2: 96% 97% 98% 97%  Weight:      Height:        General: appears to be stated  age; alert, oriented Skin: warm, dry, no rash Head:  AT/[place] Mouth:  Oral mucosa membranes appear moist, normal dentition Neck: JP drain noted. Heart:  RRR; did not appreciate any M/R/G Lungs: CTAB, did not appreciate any wheezes, rales, or rhonchi Abdomen: + BS; soft, ND, NT Vascular: 2+ pedal pulses b/l; 2+ radial pulses b/l Extremities: no peripheral edema, no muscle wasting Neuro: strength and sensation intact in upper and lower extremities b/l    Labs on Admission: I have personally reviewed following labs and imaging studies  CBC: Recent Labs  Lab 09/16/22 1431  WBC 8.8  HGB 12.9  HCT 41.0  MCV 86.0  PLT 282   Basic Metabolic Panel: Recent Labs  Lab 09/16/22 1431  NA 133*  K 4.5  CL 96*  CO2 28  GLUCOSE 107*  BUN 19  CREATININE 1.26*  CALCIUM 9.1   GFR: Estimated Creatinine Clearance: 28.3 mL/min (A) (by C-G formula based on SCr of 1.26 mg/dL (H)). Liver Function Tests: No results for input(s): "AST", "ALT", "ALKPHOS", "BILITOT", "PROT", "ALBUMIN" in the last 168 hours. No results for input(s): "LIPASE", "AMYLASE" in the last 168 hours. No results for input(s): "AMMONIA" in the last 168 hours. Coagulation Profile: No results for input(s): "INR", "PROTIME" in the last 168 hours. Cardiac Enzymes: No results for input(s): "CKTOTAL", "CKMB", "CKMBINDEX", "TROPONINI" in the last 168 hours. BNP (last 3 results) Recent Labs    07/16/22 1655  PROBNP 2,144*   HbA1C: No results for input(s): "HGBA1C" in the last 72 hours. CBG: Recent Labs  Lab 09/16/22 1255 09/20/22 0841 09/20/22 1514 09/20/22 2020  GLUCAP 151* 117* 127* 219*   Lipid Profile: No results for input(s): "CHOL", "HDL", "LDLCALC", "TRIG", "CHOLHDL", "LDLDIRECT" in the last 72 hours. Thyroid Function Tests: No results for input(s): "TSH", "T4TOTAL", "FREET4", "T3FREE", "THYROIDAB" in the last 72 hours. Anemia Panel: No results for input(s): "VITAMINB12", "FOLATE", "FERRITIN", "TIBC", "IRON",  "RETICCTPCT" in the last 72 hours. Urine analysis:    Component  Value Date/Time   COLORURINE YELLOW 04/19/2018 1214   APPEARANCEUR CLEAR 04/19/2018 1214   LABSPEC 1.010 04/19/2018 1214   PHURINE 6.5 04/19/2018 1214   GLUCOSEU NEGATIVE 04/19/2018 1214   HGBUR NEGATIVE 04/19/2018 1214   BILIRUBINUR Negative 06/13/2022 1326   KETONESUR NEGATIVE 04/19/2018 1214   PROTEINUR Positive (A) 06/13/2022 1326   PROTEINUR NEGATIVE 04/19/2018 1214   UROBILINOGEN 0.2 06/13/2022 1326   UROBILINOGEN 0.2 02/26/2017 1930   NITRITE Negative 06/13/2022 1326   NITRITE NEGATIVE 04/19/2018 1214   LEUKOCYTESUR Small (1+) (A) 06/13/2022 1326    Radiological Exams on Admission: No results found.    Assessment/Plan   Principal Problem:   Thyroid goiter Active Problems:   Paroxysmal atrial fibrillation   DM2 (diabetes mellitus, type 2)   Obstructive sleep apnea   Chronic systolic CHF (congestive heart failure)   Urinary incontinence   GERD (gastroesophageal reflux disease)      #) Thyroid goiter: Documented history of stent, associated with hyperthyroidism, postop day #0, status post right hemithyroidectomy performed by Dr. Irven [name] of the ENT.  No overt perioperative complications identified at this time.  Patient reports adequate pain control, no evidence of underlying infectious process at the present time.  ENT to remain primary service.  It is noted that the patient is on methimazole at home.  Primary service has ordered clear liquid diet, with instructions to advance as tolerated to full regular diet.  Plan: Postoperative pain control per ENT service, noting existing orders for as needed Norco as well as as needed acetaminophen.  Continue existing order for sensor on the tree.  Continue home methimazole.               #) Chronic right-sided systolic heart failure: documented history of such, with most recent echocardiogram performed in January 2024, which is notable for LVEF 60  to 65%, normal left ventricular diastolic parameters, as well as moderately reduced right ventricular systolic function, with additional results as conveyed above. No clinical evidence to suggest acutely decompensated heart failure at this time. home diuretic regimen reportedly consists of the following: Lasix and spironolactone.  Will closely monitor Rensing volume status given increased risk for preload dependent pathophysiology in the setting of her chronic right-sided systolic heart failure.  Appears relatively euvolemic at this time.  Plan: monitor strict I's & O's and daily weights. Continue home diuretic regimen.                  #) Paroxysmal atrial fibrillation: Documented history of such. In setting of CHA2DS2-VASc score of 7, there is an indication for chronic anticoagulation for thromboembolic prophylaxis. Consistent with this, patient is chronically anticoagulated on Eliquis, which has been held preoperatively leading up to today's right hemithyroidectomy, as further described above. Home AV nodal blocking regimen: Metoprolol tartrate.  Most recent echocardiogram occurred in January 2024, with results notable for mildly dilated left atrium, moderately dilated right atrium as well as severe tricuspid regurgitation, with additional results as conveyed above.  Appears to be in sinus rhythm at this time .   Plan: monitor strict I's & O's and daily weights. Continue home AV nodal blocking regimen.  Pharmacy consultation placed to assist with resumption of home Eliquis postoperatively.                #) Type 2 Diabetes Mellitus: documented history of such. Home insulin regimen: None. Home oral hypoglycemic agents: Januvia, dapagliflozin.  Most recent blood sugar: 127 around 1500 today. Most recent A1c noted to  be 6.3% when checked on 09/16/2022.   Plan: accuchecks QAC and HS with low dose SSI. Will hold home oral hypoglycemic agents during this hospitalization.                 #) Essential Hypertension: documented h/o such, with outpatient antihypertensive regimen including metoprolol to tartrate, spironolactone, Lasix.  Postoperative SBP's in the  120's - 140's  mmHg.   Plan: Close monitoring of subsequent BP via routine VS. resume home beta-blocker and diuretic regimen, as further detailed above               #) Obstructive sleep apnea: Documented history of such, with patient reporting good compliance on home nocturnal CPAP.   Plan: Order placed for respiratory therapy consultation for provision of scheduled nocturnal CPAP.                 #) GERD: documented h/o such; on Protonix as outpatient.   Plan: continue home PPI.               #) Urinary incontinence: Documented history of such, for which the patient is on oxybutynin as an outpatient.  Plan: Continue home oxybutynin.  Monitor strict I's and O's and daily weights.     DVT prophylaxis: SCD's   Code Status: Full code Family Communication: none Disposition Plan: Per Rounding Team Consults called: none;  Admission status: Observation to MedSurg; patient remains on the ENT , with the Hospitalists consulting.      I SPENT GREATER THAN 75  MINUTES IN CLINICAL CARE TIME/MEDICAL DECISION-MAKING IN COMPLETING THIS ADMISSION.      Chaney Born Simrat Kendrick DO [facility]  From 7PM - 7AM   09/20/2022, 9:05 PM

## 2022-09-20 NOTE — Plan of Care (Signed)

## 2022-09-20 NOTE — Transfer of Care (Signed)
Immediate Anesthesia Transfer of Care Note  Patient: Hailey [name]  Procedure(s) Performed: TOTAL THYROIDECTOMY (Right: Neck)  Patient Location: PACU  Anesthesia Type:General  Level of Consciousness: awake and drowsy  Airway & Oxygen Therapy: Patient Spontanous Breathing and Patient connected to face mask oxygen  Post-op Assessment: Report given to RN and Post -op Vital signs reviewed and stable  Post vital signs: Reviewed and stable  Last Vitals:  Vitals Value Taken Time  BP 132/60 09/20/22 1515  Temp    Pulse 63 09/20/22 1518  Resp 19 09/20/22 1518  SpO2 100 % 09/20/22 1518  Vitals shown include unvalidated device data.  Last Pain:  Vitals:   09/20/22 0839  TempSrc: Oral  PainSc: 0-No pain         Complications: No notable events documented.

## 2022-09-20 NOTE — Discharge Instructions (Signed)
[facility] THYROID SURGERY Post Operative Instructions Office Number [phone]  The Surgery Itself Thyroid surgery involves general anesthesia. Patients may be quite sedated for several hours after surgery and may remain sleepy for much of the day. Nausea and vomiting is occasionally seen, and usually resolves by the evening of surgery - even without additional medications. Some patients stay one night in the hospital and are discharged the next day. Other patients can go home the evening of surgery. Many patients will have a drain in place after surgery-this is removed the following day before you go home from the hospital or in the office 1-3 days after surgery.   Your Incision Your incision is closed with absorbable sutures and is covered with a small strip of tape or skin glue. You can shower and wash your hair as usual starting 24-48 hours after your drain is removed, as directed by your surgeon. If you did not have a drain in place after surgery, you may shower 24 hours after surgery. You may wash in a bathtub prior to that time if you are careful not to get your neck wet. Do not soak or scrub the incision. You might notice bruising around your incision or upper chest and slight swelling above the scar when you are upright. In addition, the scar may become pink and hard. This hardening will peak at about 3 weeks and may result in some tightness or difficulty swallowing, which will disappear over the next 2 to 3 months. You should apply sunscreen on your incision once directed by your surgeon (usually starting one month  after surgery) EVERY day for the first year after surgery. This will prevent a red or pink scar and give you the best cosmetic result for your scar. A daily moisturizer with sunscreen (example Oil of Olay with SPF 15) is fine.   Limitations You can start resuming normal activities as tolerated 7 days after surgery. For some patients, lifting can cause pain and stretching  at the surgery site for up to 2-3 weeks after surgery. You should not drive or drink alcohol while taking pain medications. Most people can return to work/school in 1-2 weeks after surgery, but there may be physical limitations as far as what you may do while at work.   Medications ? Pain medication should be used for pain as prescribed. Pain is expected after surgery. Your neck will be sore and pain will be worse when the neck is stretched and when you swallow. As the surgical site heals, pain will resolve over the course of a week. It is not uncommon for pain to get worse when you first go home because your activity may increase, but from that point on the pain should improve every day. Pain medications can cause nausea, which can be prevented if you take them with food or milk. ? You may be given a stool softener (Colace) because pain medications may make you  constipated. You can also use an over the counter stool softener.  ? You may be given Bacitracin ointment. If you are, it should be applied to the drain exit site three times a day for 2 days after the drain is removed. It should also be applied to the drain site before and after your first shower after surgery. It is normal to have some red or pink drainage from your drain exit site for 1-2 days after it is removed. ? If you were taking thyroid hormone tablets before your operation, you will continue   this after surgery, but sometimes your surgeon will change the dose. If you were not taking thyroid hormone prior to your operation, your surgeon may prescribe these tablets following surgery if the entire thyroid is removed. During your post-operative visits, you may have a blood test to measure your levels of thyroid hormone and your dose of medication may be adjusted accordingly. ? If you had parathyroid surgery or a total thyroidectomy, you may be instructed to take extra calcium supplements until your blood calcium levels stabilize. These usually  have to be purchased "over the counter" at a drug store and your surgeon will give you specific instructions. Generic brands are fine. Calcium carbonate with Vitamin D or TUMS are usually recommended. If you take any medications for gastric reflux (heartburn), you may be instructed to take Calcium Citrate with vitamin D instead of the other types of calcium. Your surgeon will instruct you on which type of calcium supplement to purchase and how many tablets to take each day after surgery. ? Take all of your routine medications as prescribed, unless told otherwise by your surgeon. Any medications which thin the blood should be avoided until your surgeon tells you to resume them.  ? IT IS OK TO TAKE OVER THE COUNTER PAIN MEDICATION (IBUPROFEN, NAPROXEN, or ACETAMINOPHEN) IN ADDITION TO YOUR PRESCRIBED MEDICATIONS. DO NOT TAKE ASPIRIN UNLESS CLEARED WITH YOUR SURGEON.  ? Limit Acetaminophen/Tylenol to less than 4,000mg/day  ? Limit Ibuprofen/Motrin to less than 3,600mg/day  Pain The main complaint following thyroid surgery is pain with swallowing and neck movement. Some people experience a dull ache, while others feel a sharp pain. This should not keep you from eating anything you want or moving your neck and will improve daily after surgery. It is normal to have a sore throat as well.   Voice Your voice may go through some temporary changes with fluctuations in volume and clarity (hoarseness). Generally, it will be better in the mornings and "tire" toward the end of the day. This can last for variable periods of time, but should clear in 8-10 weeks at most.   Cough You may feel like you have phlegm in your throat or a sore throat. This is usually because there was a tube in your windpipe while you were asleep that caused irritation that you perceive as phlegm. You will notice that if you cough, very little phlegm will come up. This should clear up in 4 to 5 days.  Hypocalcemia (Low blood calcium) In  some patients who have thyroid and parathyroid surgery, the parathyroid glands do not function properly immediately after surgery. This is usually temporary and causes the blood calcium level to drop below normal (hypocalcemia). Symptoms of hypocalcemia include numbness and tingling of your lips (like they fell asleep), in your hands and in your feet. Some patients experience a "crawling" sensation in the skin, muscle cramps or headaches. These symptoms can appear between 24 and 72 hours after surgery. It is rare for them to start more than 72 hours after surgery. If this happens, take 2 extra calcium tablets. If the symptoms do not resolve within one hour, you should call your doctor. If this happens during the business day, call the nurse triage line. If this happens in the evening or over the weekend, call the on call physician or go to the ER so your blood calcium levels can be checked. You can take extra calcium supplements (which will help to resolve symptoms) as you are contacting the clinic or   coming to the ER. Taking extra calcium supplements if you do not need them will not cause you any harm.  Reasons to call your surgeon's office ? Persistent fever over 101 F ? Increasing neck swelling ? Numbness or tingling around your mouth or fingertips  ? Pain that is not relieved by your medications ? Purulent drainage (pus) from the incision ? Redness surrounding the incision that is worsening or getting bigger ? Bleeding is possible after surgery and the most serious cases may cause trouble breathing. Symptoms include rapid swelling in the neck, trouble breathing, red and purple discoloration of the skin over the incision. If breathing difficulty occurs with this rapid neck swelling, call the doctor immediately, go to the closest emergency room or call 911.  

## 2022-09-20 NOTE — Anesthesia Procedure Notes (Signed)
Procedure Name: Intubation Date/Time: 09/20/2022 11:01 AM  Performed by: Maxine Glenn, CRNAPre-anesthesia Checklist: Patient identified, Emergency Drugs available, Suction available and Patient being monitored Patient Re-evaluated:Patient Re-evaluated prior to induction Oxygen Delivery Method: Circle System Utilized Preoxygenation: Pre-oxygenation with 100% oxygen Induction Type: IV induction Ventilation: Mask ventilation without difficulty Laryngoscope Size: Glidescope and 3 Grade View: Grade I Tube type: Oral (NIMS) Tube size: 7.0 mm Number of attempts: 1 Airway Equipment and Method: Video-laryngoscopy and Rigid stylet Placement Confirmation: ETT inserted through vocal cords under direct vision, positive ETCO2 and breath sounds checked- equal and bilateral Secured at: 21 cm Tube secured with: Tape Dental Injury: Teeth and Oropharynx as per pre-operative assessment  Comments: Elective glidescope due to large goiter. Easy mask, grade 1 view.

## 2022-09-20 NOTE — Op Note (Signed)
OPERATIVE NOTE  Hailey [name] Date/Time of Admission: 09/20/2022  8:22 AM  CSN: [id number];MRN:[id number] Attending Provider: Cheron Schaumann A, DO Room/Bed: MCPO/NONE DOB: [birth year] Age: 77 y.o.   Pre-Op Diagnosis: Nontoxic goiter, unspecified Dysphagia, unspecified  Post-Op Diagnosis: Nontoxic goiter, unspecified Dysphagia, unspecified  Procedure: Procedure(s): RIGHT HEMITHYROIDECTOMY  Anesthesia: General  Surgeon(s): Liahm Grivas A Shary Lamos, DO  Staff: Circulator: Ivin Booty, RN Relief Circulator: Leonia Corona, RN Relief Scrub: Almyra Free, RN Scrub Person: Larrie Kass, Heywood Footman, RN RN First Assistant: Jeronimo Greaves, RN  Implants: * No implants in log *  Specimens: ID Type Source Tests Collected by Time Destination  1 : Right thyroid lobe Tissue PATH ENT excision SURGICAL PATHOLOGY Laneka Mcgrory A, DO 09/20/2022 1346     Complications: None  EBL: 50 ML  Condition: stable  Operative Findings:  Large, multinodular goiter with subclavicular extension and large calcified nodule  1 of  the 2 right parathyroid gland were identified and   were nerurovascularly intact. The right recurrent laryngeal nerve was intact to visual examination and electrical stimulation at 0.3 mA  Description of Operation: The patient was identified in the preoperative holding area.  Informed written consent including risks, benefits, alternatives, and possible complications including bleeding and nerve injury were obtained.  Patient was then taken, under the care of anesthesia, to the operating suite.  Patient was placed in the supine position on the operating table and then general anesthesia was administered using a NIMS endotracheal tube per anesthesia's protocol.  Incision site on anterior neck was marked and infiltrated with 10cc Lidocaine 1% with 1:100,000 epinephrine.  A transverse incision, approximately 8cm in length was made within a skin crease with  a 15 blade scalpel. The incision was carried down through the platysma. Inferiorly and superiorly based subplatysmal flaps were raised to the level of the clavicle and thyroid cartilage, respectively.  Four 2-0 silk sutures were used to retract the skin flaps at each corner of the incision. The midline raphe of the strap muscles was then identified and separated with the Bovie electrocautery in anatomic midline. The strap muscles were then dissected from the right  thyroid capsule. The strap muscles were retracted laterally. The superior pole vasculature was then identified and surrounding tissue was dissected free using both sharp and blunt instrumentation.  The superior pole vasculature was then clamped, cut, and ligated using Harmonic Scalpel.  The inferior pole was then identified and surrounding tissue were dissected free using both sharp and blunt instrumentation.  The inferior pole vessels were then clamped, cut, and ligated using Harmonic Scalpel. The thyroid was then retracted medially.  The middle thyroid vein was identified and ligated.  The recurrent laryngeal was then identified in the tracheoesophageal groove.  The nerve integrity monitoring probe was then used to stimulate the recurrent laryngeal nerve to ensure its identification and integrity.  The nerve was carefully dissected superiorly until it entered the larynx.  The right   thyroid was removed from the anterior tracheal wall through Berry's Ligament. The thyroid lobe was then passed off the field to be evaluated by pathology as a permanent specimen. Spot bipolar electrocautery was used to achieve meticulous hemostasis, and the dissected nerve was once again stimulated and found to be functional.  The wound bed was copiously irrigated and once again inspected for any bleeding.  There was none.  Surgicel was placed in the wound bed.  A 10 french JP drain was passed through a separate stab incision inferior to  the surgical site and sutured in  place using 2-0 silk suture. The strap muscles were then reapproximated using a 3-0 chromic suture in a running, locking fashion. The incision was then closed in a layered fashion. The platysma was closed with interrupted 3-0 Vicryl sutures.  The subcutaneous tissue was then closed with buried interrupted 4-0 vicryl sutures.  The skin was then closed with 4-0 Monocryl suture and Dermabond. The patient was awakened and extubated without difficulty and transferred to the recovery room.   Laren Boom, DO [facility]  09/20/2022

## 2022-09-20 NOTE — TOC Progression Note (Signed)
Discharge medication (1) are being stored in the main pharmacy on the ground floor until patient is ready for discharge.   

## 2022-09-21 DIAGNOSIS — I2510 Atherosclerotic heart disease of native coronary artery without angina pectoris: Secondary | ICD-10-CM | POA: Diagnosis not present

## 2022-09-21 DIAGNOSIS — I480 Paroxysmal atrial fibrillation: Secondary | ICD-10-CM | POA: Diagnosis not present

## 2022-09-21 DIAGNOSIS — R32 Unspecified urinary incontinence: Secondary | ICD-10-CM | POA: Diagnosis not present

## 2022-09-21 DIAGNOSIS — R1310 Dysphagia, unspecified: Secondary | ICD-10-CM | POA: Diagnosis not present

## 2022-09-21 DIAGNOSIS — E119 Type 2 diabetes mellitus without complications: Secondary | ICD-10-CM | POA: Diagnosis not present

## 2022-09-21 DIAGNOSIS — E042 Nontoxic multinodular goiter: Secondary | ICD-10-CM | POA: Diagnosis not present

## 2022-09-21 LAB — GLUCOSE, CAPILLARY: Glucose-Capillary: 112 mg/dL — ABNORMAL HIGH (ref 70–99)

## 2022-09-21 NOTE — Progress Notes (Signed)
Hailey [name] to be D/C'd  per MD order.  Discussed with the patient and daughter Hailey [name]. Daughter has concern about mother been off of eliquis for a long period of time due to her a-fib. Able to reassure her and patient that they can call the office for further information,  all questions fully answered.  VSS, Skin clean, dry and intact without evidence of skin break down, no evidence of skin tears noted.  IV catheter discontinued intact. Site without signs and symptoms of complications. Dressing and pressure applied.  An After Visit Summary was printed and given to the patient. Patient received prescription from main Pharmacy.  D/c education completed with patient/family including follow up instructions, medication list, d/c activities limitations if indicated, with other d/c instructions as indicated by MD - patient able to verbalize understanding, all questions fully answered.   Patient instructed to return to ED, call 911, or call MD for any changes in condition.   Patient to be escorted via WC, and D/C home via private auto.

## 2022-09-21 NOTE — Progress Notes (Signed)
OTOLARYNGOLOGY - HEAD AND NECK SURGERY FACIAL PLASTIC & RECONSTRUCTIVE SURGERY PROGRESS NOTE  ID: 77 y/o F POD#1 s/p right thyroid lobectomy for large substernal goiter  Subjective: Doing well NAEON Drain output too high for removal   Objective: Vital signs in last 24 hours: Temp:  [97.6 F (36.4 C)-99.2 F (37.3 C)] 97.9 F (36.6 C) (04/20 0734) Pulse Rate:  [60-69] 60 (04/20 0734) Resp:  [16-20] 16 (04/20 0734) BP: (96-143)/(53-74) 98/53 (04/20 0734) SpO2:  [95 %-100 %] 97 % (04/20 0734) Weight:  [54.9 kg] 54.9 kg (04/19 0839)  Physical exam: General: resting comfortably in NAD. Voice strong Neck: Incision c/d/I. JP drain holding bulb suction S/S output. Neck flat.   (wbc:2,hgb:2,hct:2,plt:2) No results for input(s): "NA", "K", "CL", "CO2", "GLUCOSE", "BUN", "CREATININE", "CALCIUM" in the last 72 hours.  JP Drain output 88 / 24 hours.   Medications: I have reviewed the patient's current medications.  Assessment/Plan: POD#1 s/p right thyroid lobectomy for SSG. Doing well.  DC home with drain RTC clinic Monday for drain removal Defer anticoagulation until after drain removal    LOS: 0 days   Scarlette Ar 09/21/2022, 7:48 AM  I have personally spent 15 minutes involved in face-to-face and non-face-to-face activities for this patient on the day of the visit.  Professional time spent includes the following activities, in addition to those noted in the documentation: preparing to see the patient (eg, review of tests), obtaining and/or reviewing separately obtained history, performing a medically appropriate examination and/or evaluation, counseling and educating the patient/family/caregiver, ordering medications, tests or procedures, referring and communicating with other healthcare professionals, documenting clinical information in the electronic or other health record, independently interpreting results and communicating results with the patient/family/caregiver,  care coordination.  Electronically signed by:  Scarlette Ar, MD  Staff Physician Facial Plastic & Reconstructive Surgery Otolaryngology - Head and Neck Surgery [facility] [place]

## 2022-09-21 NOTE — Discharge Summary (Signed)
Physician Discharge Summary  Patient ID: Hailey [name] MRN: [id number] DOB/AGE: 12-18-1945 77 y.o.  Admit date: 09/20/2022 Discharge date: 09/21/2022  Admission Diagnoses:  Principal Problem:   Thyroid goiter Active Problems:   Paroxysmal atrial fibrillation   DM2 (diabetes mellitus, type 2)   Obstructive sleep apnea   Chronic systolic CHF (congestive heart failure)   Urinary incontinence   GERD (gastroesophageal reflux disease)   Discharge Diagnoses:  Same  Surgeries: Procedure(s): TOTAL THYROIDECTOMY on 09/20/2022   Consultants: Medicine  Discharged Condition: Improved  Hospital Course: Hailey [name] is an 77 y.o. female who was admitted 09/20/2022 with a chief complaint of No chief complaint on file. , and found to have a diagnosis of Thyroid goiter.  They were brought to the operating room on 09/20/2022 and underwent the above named procedures.    Physical Exam:  General: Awake and alert, no acute distress Neck:Soft/flat. Incision c/d/I. Jp drain holding bulb suction with s/s output. Respiratory: Respiratory effort is normal.   Recent vital signs:  Vitals:   09/21/22 0522 09/21/22 0734  BP: (!) 96/54 (!) 98/53  Pulse: 60 60  Resp: 16 16  Temp: 98 F (36.7 C) 97.9 F (36.6 C)  SpO2: 96% 97%    Recent laboratory studies:  Results for orders placed or performed during the hospital encounter of 09/20/22  Glucose, capillary  Result Value Ref Range   Glucose-Capillary 117 (H) 70 - 99 mg/dL  Glucose, capillary  Result Value Ref Range   Glucose-Capillary 127 (H) 70 - 99 mg/dL   Comment 1 Notify RN    Comment 2 Document in Chart   Glucose, capillary  Result Value Ref Range   Glucose-Capillary 219 (H) 70 - 99 mg/dL    Discharge Medications:   Allergies as of 09/21/2022       Reactions   Codeine Nausea And Vomiting, Nausea Only   Demerol Hcl [meperidine] Nausea Only   Ketorolac Nausea And Vomiting, Nausea Only, Other (See Comments)   "pt told to never take  this med"   Demerol Nausea And Vomiting   Multaq [dronedarone] Other (See Comments)   Increased fluid retention   Flexeril [cyclobenzaprine] Nausea And Vomiting   Latex Rash   Pt stated on 09/20/2022        Medication List     STOP taking these medications    Eliquis 5 MG Tabs tablet Generic drug: apixaban   Vitamin B12 500 MCG Tabs       TAKE these medications    acetaminophen 500 MG tablet Commonly known as: TYLENOL Take 500-1,000 mg by mouth every 6 (six) hours as needed (pain.).   Cranberry 500 MG Caps Take 500 mg by mouth daily as needed (Yeast infection).   dapagliflozin propanediol 10 MG Tabs tablet Commonly known as: Farxiga Take 1 tablet (10 mg total) by mouth daily before breakfast.   furosemide 80 MG tablet Commonly known as: LASIX Take 1 tablet (80 mg total) by mouth 2 (two) times daily.   HYDROcodone-acetaminophen 5-325 MG tablet Commonly known as: NORCO/VICODIN Take 1 tablet by mouth every 6 (six) hours as needed for up to 5 days for moderate pain.   Januvia 100 MG tablet Generic drug: sitaGLIPtin Take 1 tablet by mouth once daily   loperamide 2 MG capsule Commonly known as: IMODIUM Take 2 mg by mouth daily as needed for diarrhea or loose stools.   methimazole 10 MG tablet Commonly known as: TAPAZOLE TAKE 1 TABLET BY MOUTH ONCE DAILY WITH FOOD  FOR THYROID   metoprolol tartrate 50 MG tablet Commonly known as: LOPRESSOR TAKE 1 TABLET BY MOUTH ONCE DAILY IN THE MORNING THEN TAKE 1/2 (ONE-HALF) TABLET ONCE DAILY IN THE EVENING   ondansetron 4 MG tablet Commonly known as: Zofran Take 1 tablet (4 mg total) by mouth every 8 (eight) hours as needed for nausea or vomiting.   ONE TOUCH SURESOFT Misc Use to test  Blood sugar once daily DX: E11.8,   OneTouch Ultra test strip Generic drug: glucose blood USE DAILY TO CHECK BLOOD SUGAR E11.8   oxybutynin 5 MG tablet Commonly known as: DITROPAN Take 1 tablet (5 mg total) by mouth daily.    pantoprazole 40 MG tablet Commonly known as: PROTONIX Take 1 tablet (40 mg total) by mouth daily.   potassium chloride SA 20 MEQ tablet Commonly known as: KLOR-CON M Take 1 tablet (20 mEq total) by mouth 2 (two) times daily.   sildenafil 20 MG tablet Commonly known as: REVATIO Take 1 tablet (20 mg total) by mouth 3 (three) times daily.   spironolactone 25 MG tablet Commonly known as: ALDACTONE Take 1 tablet (25 mg total) by mouth daily.   Vitamin D3 25 MCG (1000 UT) Caps Take 1,000 Units by mouth in the morning.        Diagnostic Studies: No results found.  Disposition: Discharge disposition: 01-Home or Self Care       Discharge Instructions     Discharge patient   Complete by: As directed    Discharge disposition: 01-Home or Self Care   Discharge patient date: 09/21/2022        Follow-up Information     Skotnicki, Meghan A, DO Follow up on 10/04/2022.   Specialty: Otolaryngology Why: Follow up as scheduled on 10/04/2022 Contact information: [address] [place] [address] [place] [address] [phone]                  Signed: Scarlette Ar 09/21/2022, 7:51 AM

## 2022-09-21 NOTE — Progress Notes (Signed)
Patient was admitted primarily under ENT service after the surgery yesterday Hospitalist service was involved for overnight monitoring.   Discharge done by primary service this morning before I was available for round. Unable to see the patient Chart reviewed.  Medical issues stable. Anticoagulation to be held till drain removal per ENT.   [facility] will not bill for today.

## 2022-09-23 ENCOUNTER — Encounter (HOSPITAL_COMMUNITY): Payer: Self-pay | Admitting: Otolaryngology

## 2022-09-23 DIAGNOSIS — E049 Nontoxic goiter, unspecified: Secondary | ICD-10-CM | POA: Diagnosis not present

## 2022-09-23 LAB — SURGICAL PATHOLOGY

## 2022-09-23 NOTE — Anesthesia Postprocedure Evaluation (Signed)
Anesthesia Post Note  Patient: Hailey [name]  Procedure(s) Performed: RIGHT HEMITHYROIDECTOMY (Right: Neck)     Patient location during evaluation: PACU Anesthesia Type: General Level of consciousness: awake and alert Pain management: pain level controlled Vital Signs Assessment: post-procedure vital signs reviewed and stable Respiratory status: spontaneous breathing, nonlabored ventilation and respiratory function stable Cardiovascular status: blood pressure returned to baseline and stable Postop Assessment: no apparent nausea or vomiting Anesthetic complications: no   No notable events documented.  Last Vitals:  Vitals:   09/21/22 0734 09/21/22 1018  BP: (!) 98/53 (!) 102/49  Pulse: 60 62  Resp: 16   Temp: 36.6 C   SpO2: 97%     Last Pain:  Vitals:   09/21/22 1101  TempSrc:   PainSc: 4    Pain Goal: Patients Stated Pain Goal: 0 (09/20/22 1754)                 Lowella Curb

## 2022-09-24 ENCOUNTER — Other Ambulatory Visit: Payer: Self-pay

## 2022-09-24 DIAGNOSIS — E118 Type 2 diabetes mellitus with unspecified complications: Secondary | ICD-10-CM

## 2022-09-24 NOTE — Telephone Encounter (Signed)
Refill requested for Januvia 100 mg tablet. High warning came up.  Medication pended and sent to Dr. Jacalyn Lefevre

## 2022-09-25 ENCOUNTER — Telehealth: Payer: Self-pay | Admitting: Cardiovascular Disease

## 2022-09-25 MED ORDER — SITAGLIPTIN PHOSPHATE 100 MG PO TABS: 100.0000 mg | ORAL_TABLET | Freq: Every day | ORAL | 1 refills | Status: DC

## 2022-09-25 NOTE — Telephone Encounter (Signed)
Patient wanted to let Dr. [name] know she did have the thyroid surgery on 4/19 and everything went well and she is considering doing the next side.  Patient wanted to thank Dr. [name] for all he has done.

## 2022-10-04 DIAGNOSIS — Z9889 Other specified postprocedural states: Secondary | ICD-10-CM | POA: Diagnosis not present

## 2022-10-04 DIAGNOSIS — E049 Nontoxic goiter, unspecified: Secondary | ICD-10-CM | POA: Diagnosis not present

## 2022-10-07 ENCOUNTER — Other Ambulatory Visit: Payer: Self-pay | Admitting: Family Medicine

## 2022-10-07 DIAGNOSIS — N3281 Overactive bladder: Secondary | ICD-10-CM

## 2022-10-08 ENCOUNTER — Other Ambulatory Visit: Payer: Self-pay | Admitting: Cardiovascular Disease

## 2022-10-08 DIAGNOSIS — I5033 Acute on chronic diastolic (congestive) heart failure: Secondary | ICD-10-CM

## 2022-10-14 DIAGNOSIS — Z515 Encounter for palliative care: Secondary | ICD-10-CM | POA: Diagnosis not present

## 2022-10-14 DIAGNOSIS — I5032 Chronic diastolic (congestive) heart failure: Secondary | ICD-10-CM | POA: Diagnosis not present

## 2022-10-21 ENCOUNTER — Other Ambulatory Visit: Payer: Self-pay

## 2022-10-24 DIAGNOSIS — E049 Nontoxic goiter, unspecified: Secondary | ICD-10-CM | POA: Diagnosis not present

## 2022-10-31 ENCOUNTER — Telehealth: Payer: Self-pay | Admitting: Cardiovascular Disease

## 2022-10-31 DIAGNOSIS — Z79899 Other long term (current) drug therapy: Secondary | ICD-10-CM

## 2022-10-31 MED ORDER — FLUCONAZOLE 150 MG PO TABS: 150.0000 mg | ORAL_TABLET | Freq: Every day | ORAL | 6 refills | Status: DC | PRN

## 2022-10-31 NOTE — Telephone Encounter (Signed)
Pt c/o medication issue:  1. Name of Medication:   furosemide (LASIX) 80 MG tablet  potassium chloride SA (KLOR-CON M) 20 MEQ tablet  spironolactone (ALDACTONE) 25 MG tablet   2. How are you currently taking this medication (dosage and times per day)?   3. Are you having a reaction (difficulty breathing--STAT)? Yes   4. What is your medication issue? Patient states that Dr. [name] has changed these medications, but now seems to have leg cramps and nausea

## 2022-10-31 NOTE — Telephone Encounter (Signed)
Returned call to patient who reports that she is currently taking - Lasix 80mg  BID, Spironolactone 25mg  once daily, and Potassium BID. Patient reports that  the last 2 weeks she has had terrible leg cramps at night and has been staying up for as long as 2 hours with pain and having to walk due to this. Patient reports that she wasn't sure if this had anything to do with her medications. Last BMET was 09/16/22 which showed K of 4.5. will check with MD about repeat BMET. Also patient reports issues with yeast since starting her farxiga, she states that this has started to become bothersome and would like to know if there was anything she can do  between now and time befor her appointment in June in regard to her leg cramps and yeast infection. Will forward to PharmD/MD. Patient aware and verbalized understanding.

## 2022-10-31 NOTE — Telephone Encounter (Signed)
BMET and Mag ordered- patient aware of all lab instructions.   Advised her to try the Mag oxide 400mg  once daily that she can get over the counter. Also sent in prescription for Fluconazole 150mg  with 6 refills to be used as needed throughout the year if it flares up again. Patient aware of all instructions and verbalized understanding.   Advised patient to call back to office with any issues, questions, or concerns. Patient verbalized understanding.

## 2022-10-31 NOTE — Telephone Encounter (Signed)
Please check BMET and magnesium, but while we wait for result, try taking Mag oxide 400 mg daily (OTC). And prescribe fluconazole 150 mg once for the yeast infection. Please send in fluconazole 150 mg x 6 tabs, no refills. She can take one tablet as needed, throughout the year, if it flares again.

## 2022-11-01 DIAGNOSIS — Z79899 Other long term (current) drug therapy: Secondary | ICD-10-CM | POA: Diagnosis not present

## 2022-11-02 LAB — BASIC METABOLIC PANEL
BUN/Creatinine Ratio: 13 (ref 12–28)
BUN: 18 mg/dL (ref 8–27)
CO2: 20 mmol/L (ref 20–29)
Calcium: 9.1 mg/dL (ref 8.7–10.3)
Chloride: 94 mmol/L — ABNORMAL LOW (ref 96–106)
Creatinine, Ser: 1.34 mg/dL — ABNORMAL HIGH (ref 0.57–1.00)
Glucose: 113 mg/dL — ABNORMAL HIGH (ref 70–99)
Potassium: 4.7 mmol/L (ref 3.5–5.2)
Sodium: 134 mmol/L (ref 134–144)
eGFR: 41 mL/min/{1.73_m2} — ABNORMAL LOW (ref >59)

## 2022-11-02 LAB — MAGNESIUM: Magnesium: 2.4 mg/dL — ABNORMAL HIGH (ref 1.6–2.3)

## 2022-11-04 ENCOUNTER — Other Ambulatory Visit: Payer: Self-pay

## 2022-11-04 DIAGNOSIS — R1111 Vomiting without nausea: Secondary | ICD-10-CM | POA: Diagnosis not present

## 2022-11-04 DIAGNOSIS — R110 Nausea: Secondary | ICD-10-CM | POA: Diagnosis not present

## 2022-11-04 MED ORDER — ONETOUCH ULTRA VI STRP: ORAL_STRIP | 11 refills | Status: DC

## 2022-11-06 ENCOUNTER — Ambulatory Visit (INDEPENDENT_AMBULATORY_CARE_PROVIDER_SITE_OTHER): Payer: Medicare Other

## 2022-11-06 DIAGNOSIS — I495 Sick sinus syndrome: Secondary | ICD-10-CM | POA: Diagnosis not present

## 2022-11-06 LAB — CUP PACEART REMOTE DEVICE CHECK
Battery Remaining Longevity: 108 mo
Battery Remaining Percentage: 75 %
Battery Voltage: 3.01 V
Brady Statistic RV Percent Paced: 97 %
Date Time Interrogation Session: 20240605040016
Implantable Lead Connection Status: 753985
Implantable Lead Connection Status: 753985
Implantable Lead Connection Status: 753985
Implantable Lead Implant Date: 20120702
Implantable Lead Implant Date: 20120702
Implantable Lead Implant Date: 20210308
Implantable Lead Location: 753859
Implantable Lead Location: 753860
Implantable Lead Location: 753860
Implantable Pulse Generator Implant Date: 20210308
Lead Channel Impedance Value: 660 Ohm
Lead Channel Pacing Threshold Amplitude: 0.5 V
Lead Channel Pacing Threshold Pulse Width: 0.5 ms
Lead Channel Sensing Intrinsic Amplitude: 12 mV
Lead Channel Setting Pacing Amplitude: 0.75 V
Lead Channel Setting Pacing Pulse Width: 0.5 ms
Lead Channel Setting Sensing Sensitivity: 2 mV
Pulse Gen Model: 2272
Pulse Gen Serial Number: 3801383

## 2022-11-07 NOTE — Progress Notes (Signed)
Cardiology Clinic Note   Patient Name: Hailey [name] Date of Encounter: 11/19/2022  Primary Care Provider:  Frederica Kuster, MD Primary Cardiologist:  [name], MD  Patient Profile    77 y.o. female with longstanding persistent atrial fibrillation (s/p RF ablation procedures, now with longstanding persistent atrial fibrillation for over a year), s/p dual-chamber permanent pacemaker (St. Jude Accent 2012 for tachycardia-bradycardia syndrome and 2:1 atrioventricular block, generator change and new right ventricular lead for high pacing thresholds March 2021), chronic diastolic heart failure (history of acute decompensation on Multaq), multifactorial pulmonary artery hypertension, CAD (nonobstructive by cath February 2019), type 2 diabetes mellitus, hypertension, good lipid profile without therapy, hyperthyroidism (on propylthiouracil.)  She is status post thyroidectomy on 09/20/2022 in the setting of large thyroid goiter.  She had been evaluated by cardiology prior to this to make sure she was well compensated concerning diastolic CHF and cleared to proceed.  She was discharged on 09/21/2022.  Past Medical History    Past Medical History:  Diagnosis Date   Acute on chronic diastolic CHF (congestive heart failure), NYHA class 1 (HCC) 04/29/2013   Allergic rhinitis    Brady-tachy syndrome (HCC)    s/p STJ dual chamber PPM by Dr [name]   CAD (coronary artery disease)    70% RCA stenosis, treated medically   Control of atrial fibrillation with pacemaker (HCC)    Diabetes mellitus (HCC)    type 2   DJD (degenerative joint disease)    Dyspnea    occasional   Hearing loss    Right ear only - no hearing aids   Hypertension    Hyperthyroidism    Paroxysmal atrial fibrillation (HCC)    Pacemaker St Jude   Pneumonia    years ago- before 2012 per patient   Polio    as a child   PONV (postoperative nausea and vomiting)    Presence of permanent cardiac pacemaker    St Jude  pacemaker   Pulmonary disease    Sleep apnea    uses  CPAP nightly   Past Surgical History:  Procedure Laterality Date   arm surgery d/t fx Right 1996   ATRIAL FIBRILLATION ABLATION  07/31/2012; 08-10-13   PVI by Dr [name]   ATRIAL FIBRILLATION ABLATION N/A 07/31/2012   Procedure: ATRIAL FIBRILLATION ABLATION;  Surgeon: Hillis Range, MD;  Location: [facility] CATH LAB;  Service: Cardiovascular;  Laterality: N/A;   ATRIAL FIBRILLATION ABLATION N/A 08/10/2013   Procedure: ATRIAL FIBRILLATION ABLATION;  Surgeon: Gardiner Rhyme, MD;  Location: [facility] CATH LAB;  Service: Cardiovascular;  Laterality: N/A;   BACK SURGERY  07/26/2012   dR. [facility]   CARDIAC CATHETERIZATION  12/03/2010   single vessel mid RCA   CARDIAC ELECTROPHYSIOLOGY MAPPING AND ABLATION  07/31/2012   Dr. Hillis Range   CESAREAN SECTION  09/20/1974   Dr. Greta Doom   CHOLECYSTECTOMY  1994   COLONOSCOPY WITH PROPOFOL N/A 03/21/2015   Procedure: COLONOSCOPY WITH PROPOFOL;  Surgeon: Charna Elizabeth, MD;  Location: [facility] ENDOSCOPY;  Service: Endoscopy;  Laterality: N/A;   DIAGNOSTIC MAMMOGRAM  04/16/2017   Tylene Fantasia. Morris, DO   EYE SURGERY Bilateral    remove cataracts   LAPAROSCOPIC HYSTERECTOMY  Late 3s to early 66s   LEFT HEART CATH AND CORONARY ANGIOGRAPHY N/A 07/30/2017   Procedure: LEFT HEART CATH AND CORONARY ANGIOGRAPHY;  Surgeon: Lennette Bihari, MD;  Location: [facility] INVASIVE CV LAB;  Service: Cardiovascular;  Laterality: N/A;   LUMBAR LAMINECTOMY/DECOMPRESSION MICRODISCECTOMY Right 12/23/2012  Procedure: Right L5-S1 Laminotomy for resection of synovial cyst;  Surgeon: [name], MD;  Location: [facility] NEURO ORS;  Service: Neurosurgery;  Laterality: Right;  Right Lumbar five-sacral one Laminotomy for resection of synovial cyst   NM MYOCAR PERF WALL MOTION  12/20/2010   normal   PACEMAKER IMPLANT N/A 08/09/2019   Procedure: PACEMAKER IMPLANT;  Surgeon: [name], MD;  Location: [facility] INVASIVE CV LAB;  Service: Cardiovascular;   Laterality: N/A;   PACEMAKER INSERTION  12/03/2010   SJM Accent DR RF implanted by Dr [name]   RIGHT HEART CATH N/A 04/22/2018   Procedure: RIGHT HEART CATH;  Surgeon: Iran Ouch, MD;  Location[facility] INVASIVE CV LAB;  Service: Cardiovascular;  Laterality: N/A;   RIGHT HEART CATH N/A 07/09/2022   Procedure: RIGHT HEART CATH;  Surgeon: Dolores Patty, MD;  Location: [facility] INVASIVE CV LAB;  Service: Cardiovascular;  Laterality: N/A;   TEE WITHOUT CARDIOVERSION N/A 07/30/2012   Procedure: TRANSESOPHAGEAL ECHOCARDIOGRAM (TEE);  Surgeon: [name], MD;  Location: [facility] ENDOSCOPY;  Service: Cardiovascular;  Laterality: N/A;  h&p in file-hope   TEE WITHOUT CARDIOVERSION N/A 08/09/2013   Procedure: TRANSESOPHAGEAL ECHOCARDIOGRAM (TEE);  Surgeon: Lewayne Bunting, MD;  Location: [facility] ENDOSCOPY;  Service: Cardiovascular;  Laterality: N/A;   THYROID LOBECTOMY Right 09/20/2022   Procedure: RIGHT HEMITHYROIDECTOMY;  Surgeon: Laren Boom, DO;  Location: [facility] OR;  Service: ENT;  Laterality: Right;   TOTAL ABDOMINAL HYSTERECTOMY  1981   ULTRASOUND GUIDANCE FOR VASCULAR ACCESS  04/22/2018   Procedure: Ultrasound Guidance For Vascular Access;  Surgeon: Iran Ouch, MD;  Location: [facility] INVASIVE CV LAB;  Service: Cardiovascular;;    Allergies  Allergies  Allergen Reactions   Codeine Nausea And Vomiting and Nausea Only   Demerol Hcl [Meperidine] Nausea Only   Ketorolac Nausea And Vomiting, Nausea Only and Other (See Comments)    "pt told to never take this med"   Demerol Nausea And Vomiting   Multaq [Dronedarone] Other (See Comments)    Increased fluid retention   Flexeril [Cyclobenzaprine] Nausea And Vomiting   Latex Rash    Pt stated on 09/20/2022    History of Present Illness    Hailey [name] returns today for ongoing assessment and management of chronic diastolic heart failure, paroxysmal atrial fibrillation, with recent hospitalization due to thyroid goiter and thyroidectomy in  April 2024.  She called our office on 10/31/2022 with complaints of severe leg cramps, being that was ordered with a potassium of 4.5.  She was started on mag oxide 400 mg once daily.  She also complains of a yeast infection and was started on fluconazole 150 mg with refills to use as needed as she is on [place].  She was to remain on Lasix 80 mg twice daily, spironolactone 25 mg daily and potassium 20 mill equivalents twice daily.  Hailey [name] feels much better than she has in a while.  She no longer has any leg cramping since starting magnesium.  She is able to swallow her medications much better since the goiter removal.  She continues to have some NYHA Class II symptoms.  She states she can walk up to 10 minutes and then has to stop to catch her breath.  She is beginning to increase her energy some.  She now has her medications in pill packs.  Endocrinologist is now regulating her thyroid medications postoperatively, with ongoing adjustments based on labs.  She has not yet completely regulated.  She denies any volume overload  symptoms, no swelling, weight has been stable, no bleeding on Eliquis.  Home Medications    Current Outpatient Medications  Medication Sig Dispense Refill   acetaminophen (TYLENOL) 500 MG tablet Take 500-1,000 mg by mouth every 6 (six) hours as needed (pain.).     Cholecalciferol (VITAMIN D3) 1000 UNITS CAPS Take 1,000 Units by mouth in the morning.     Cranberry 500 MG CAPS Take 500 mg by mouth daily as needed (Yeast infection).     dapagliflozin propanediol (FARXIGA) 10 MG TABS tablet Take 1 tablet (10 mg total) by mouth daily before breakfast. 90 tablet 6   ELIQUIS 5 MG TABS tablet Take 5 mg by mouth 2 (two) times daily.     fluconazole (DIFLUCAN) 150 MG tablet Take 1 tablet (150 mg total) by mouth daily as needed. Use one tablet as needed throughout the year for flare ups 1 tablet 6   furosemide (LASIX) 80 MG tablet TAKE ONE AND 1/2 TABLETS BY MOUTH ONCE DAILY 135 tablet  3   glucose blood (ONETOUCH ULTRA) test strip USE DAILY TO CHECK BLOOD SUGAR E11.8 100 each 11   Lancets Misc. (ONE TOUCH SURESOFT) MISC Use to test  Blood sugar once daily DX: E11.8, 1 each 0   levothyroxine (SYNTHROID) 25 MCG tablet Take 25 mcg by mouth daily before breakfast.     loperamide (IMODIUM) 2 MG capsule Take 2 mg by mouth daily as needed for diarrhea or loose stools.     magnesium oxide (MAG-OX) 400 (240 Mg) MG tablet Take 400 mg by mouth daily.     methimazole (TAPAZOLE) 10 MG tablet TAKE 1 TABLET BY MOUTH ONCE DAILY WITH FOOD FOR THYROID 90 tablet 1   metoprolol tartrate (LOPRESSOR) 50 MG tablet TAKE 1 TABLET BY MOUTH ONCE DAILY IN THE MORNING THEN TAKE 1/2 (ONE-HALF) TABLET ONCE DAILY IN THE EVENING 135 tablet 2   ondansetron (ZOFRAN) 4 MG tablet Take 1 tablet (4 mg total) by mouth every 8 (eight) hours as needed for nausea or vomiting. 20 tablet 0   oxybutynin (DITROPAN) 5 MG tablet TAKE ONE TABLET BY MOUTH EVERY DAY 90 tablet 1   pantoprazole (PROTONIX) 40 MG tablet Take 1 tablet (40 mg total) by mouth daily. 90 tablet 3   potassium chloride SA (KLOR-CON M) 20 MEQ tablet TAKE ONE TABLET BY MOUTH TWICE DAILY 180 tablet 2   sildenafil (REVATIO) 20 MG tablet Take 1 tablet (20 mg total) by mouth 3 (three) times daily. 90 tablet 5   sitaGLIPtin (JANUVIA) 100 MG tablet Take 1 tablet (100 mg total) by mouth daily. 90 tablet 1   spironolactone (ALDACTONE) 25 MG tablet Take 1 tablet (25 mg total) by mouth daily. 90 tablet 3   No current facility-administered medications for this visit.     Family History    Family History  Problem Relation Age of Onset   Cancer Brother    Heart attack Sister    Breast cancer Sister    Diabetes Sister    Heart disease Sister    Breast cancer Sister    Dementia Sister    Colon cancer Sister    Heart disease Sister    Breast cancer Sister    Suicidality Brother    Prostate cancer Brother    Dementia Brother    She indicated that her mother  is deceased. She indicated that her father is deceased. She indicated that three of her seven sisters are alive. She indicated that only one of her  three brothers is alive. She indicated that her maternal grandmother is deceased. She indicated that her maternal grandfather is deceased. She indicated that her paternal grandmother is deceased. She indicated that her paternal grandfather is deceased. She indicated that all of her three children are alive.  Social History    Social History   Socioeconomic History   Marital status: Married    Spouse name: Loistine Chance   Number of children: 3   Years of education: 13   Highest education level: Some college, no degree  Occupational History   Occupation: housewife  Tobacco Use   Smoking status: Never   Smokeless tobacco: Never  Vaping Use   Vaping Use: Never used  Substance and Sexual Activity   Alcohol use: No   Drug use: No   Sexual activity: Not Currently    Birth control/protection: Abstinence, Surgical    Comment: Hysterectomy  Other Topics Concern   Not on file  Social History Narrative   Lives in [place].  Retired.  Was office clerk in several companies.  Did some technical school after completing high school.  She has no pets.  She and her husband live in a one-story home.  They've been married since 1965.  She follows a heart healthy diet.  She has a living will and hcpoa but does not have a DNR form.  She does struggle to prepare food.     Social Determinants of Health   Financial Resource Strain: Not on file  Food Insecurity: No Food Insecurity (09/20/2022)   Hunger Vital Sign    Worried About Running Out of Food in the Last Year: Never true    Ran Out of Food in the Last Year: Never true  Transportation Needs: No Transportation Needs (09/20/2022)   PRAPARE - Administrator, Civil Service (Medical): No    Lack of Transportation (Non-Medical): No  Physical Activity: Not on file  Stress: Not on file  Social  Connections: Not on file  Intimate Partner Violence: Not At Risk (09/20/2022)   Humiliation, Afraid, Rape, and Kick questionnaire    Fear of Current or Ex-Partner: No    Emotionally Abused: No    Physically Abused: No    Sexually Abused: No     Review of Systems    General:  No chills, fever, night sweats or weight changes.  Cardiovascular:  No chest pain, positive for dyspnea on exertion after walking 10 minutes, no, edema, orthopnea, palpitations, paroxysmal nocturnal dyspnea. Dermatological: No rash, lesions/masses Respiratory: No cough, dyspnea Urologic: No hematuria, dysuria Abdominal:   No nausea, vomiting, diarrhea, bright red blood per rectum, melena, or hematemesis Neurologic:  No visual changes, wkns, changes in mental status. All other systems reviewed and are otherwise negative except as noted above.     Physical Exam    VS:  BP (!) 126/52 (BP Location: Left Arm, Patient Position: Sitting, Cuff Size: Normal)   Pulse 71   Ht 4\' 11"  (1.499 m)   Wt 122 lb 9.6 oz (55.6 kg)   SpO2 97%   BMI 24.76 kg/m  , BMI Body mass index is 24.76 kg/m.     GEN: Well nourished, well developed, in no acute distress. HEENT: normal. Neck: Supple, no JVD, carotid bruits, or masses.  Small enlargement left neck from thyroid corder which is being followed with no need for intervention at this time, right sided thyroid goiter surgically removed. Cardiac: RRR, soft systolic murmur left sternal border, no, rubs, or gallops. No clubbing,  cyanosis, edema.  Radials/DP/PT 2+ and equal bilaterally.  Bilateral varicosities are noted below the knee. Respiratory:  Respirations regular and unlabored, clear to auscultation bilaterally. GI: Soft, nontender, nondistended, BS + x 4. MS: no deformity or atrophy. Skin: warm and dry, no rash. Neuro:  Strength and sensation are intact. Psych: Normal affect.  Accessory Clinical Findings    ECG personally reviewed by me today-  -not completed this office  visit.  Lab Results  Component Value Date   WBC 8.8 09/16/2022   HGB 12.9 09/16/2022   HCT 41.0 09/16/2022   MCV 86.0 09/16/2022   PLT 282 09/16/2022   Lab Results  Component Value Date   CREATININE 1.34 (H) 11/01/2022   BUN 18 11/01/2022   NA 134 11/01/2022   K 4.7 11/01/2022   CL 94 (L) 11/01/2022   CO2 20 11/01/2022   Lab Results  Component Value Date   ALT 8 08/01/2022   AST 17 08/01/2022   ALKPHOS 123 (H) 08/01/2022   BILITOT 1.2 08/01/2022   Lab Results  Component Value Date   CHOL 125 11/13/2020   HDL 50 11/13/2020   LDLCALC 60 11/13/2020   TRIG 70 11/13/2020   CHOLHDL 2.5 11/13/2020    Lab Results  Component Value Date   HGBA1C 6.3 (H) 09/16/2022    Review of Prior Studies: [facility] 07/09/2022 Findings:   RA = 12 RV = 76/11 PA = 73/27 (41) PCW = 23 Fick cardiac output/index = 3.2/2.1 PVR = 5.6 WU Ao sat = 96% PA sat = 56%, 58% PAPi = 3.8   Assessment: 1. Mixed moderate pulmonary venous and pulmonary arterial HTN with elevated filling pressures and low cardiac output   Plan/Discussion:    Options limited. Not candidate for selective pulmonary artery vasodilators. Would push diuretics as tolerated. Ensure oxygenation > 88% at all times. Consider pulmonary rehab.   Echocardiogram 07/01/2022 1. Evidence for moderate pulmonary HTN Estimated PAs pressure 62 mmHg.   2. Left ventricular ejection fraction, by estimation, is 60 to 65%. The  left ventricle has normal function. The left ventricle has no regional  wall motion abnormalities. Left ventricular diastolic parameters were  normal.   3. Catheter in RA/RV . Right ventricular systolic function is moderately  reduced. The right ventricular size is normal. There is moderately  elevated pulmonary artery systolic pressure.   4. Left atrial size was mildly dilated.   5. Right atrial size was moderately dilated.   6. The mitral valve is abnormal. No evidence of mitral valve  regurgitation. No evidence of  mitral stenosis.   7. Tricuspid valve regurgitation is severe.   8. The aortic valve is tricuspid. Aortic valve regurgitation is trivial.  No aortic stenosis is present.   9. The inferior vena cava is dilated in size with <50% respiratory  variability, suggesting right atrial pressure of 15 mmHg.    Assessment & Plan   1.  Permanent atrial fibrillation: Remains on Eliquis without any issues of bleeding.  Good heart rate control on metoprolol and permanent pacemaker in situ.  She offers no complaints of rapid palpitations.  Continue current medication regimen.  2.  Chronic diastolic heart failure: Doing well, with diuretics, and Farxiga.  Weight has been stable and 122 pounds.  She continues [place] heart association class II symptoms.  She is trying to gain her strength back and continues to try and walk daily.  She has had some muscle cramping on diuretics and has had magnesium oxide 400 mg  added to her regimen.  I will check a BMET and magnesium level today.  3.  Permanent pacemaker in situ: Followed by remote transmissions per protocol.  4.  Coronary artery disease: History of RCA stenosis of 70% treated medically.  She is completely asymptomatic.  Continue secondary management with blood pressure control, statin therapy.  5.  Status post thyroidectomy: Medications adjustment to maintain TSH levels per endocrinology.  Once she is at therapeutic range be able to adjust medications for A-fib if necessary.       Signed, Bettey Mare. Liborio Nixon, ANP, AACC   11/19/2022 1:50 PM      Office [phone] Fax [phone]  Notice: This dictation was prepared with Dragon dictation along with smaller phrase technology. Any transcriptional errors that result from this process are unintentional and may not be corrected upon review.

## 2022-11-11 DIAGNOSIS — E042 Nontoxic multinodular goiter: Secondary | ICD-10-CM | POA: Diagnosis not present

## 2022-11-11 DIAGNOSIS — Z9889 Other specified postprocedural states: Secondary | ICD-10-CM | POA: Diagnosis not present

## 2022-11-13 ENCOUNTER — Encounter: Payer: Self-pay | Admitting: Family Medicine

## 2022-11-13 ENCOUNTER — Ambulatory Visit (INDEPENDENT_AMBULATORY_CARE_PROVIDER_SITE_OTHER): Payer: Medicare Other | Admitting: Family Medicine

## 2022-11-13 VITALS — BP 118/72 | HR 60 | Temp 97.0°F | Ht 59.0 in | Wt 122.0 lb

## 2022-11-13 DIAGNOSIS — G4733 Obstructive sleep apnea (adult) (pediatric): Secondary | ICD-10-CM

## 2022-11-13 DIAGNOSIS — E0590 Thyrotoxicosis, unspecified without thyrotoxic crisis or storm: Secondary | ICD-10-CM | POA: Diagnosis not present

## 2022-11-13 DIAGNOSIS — I10 Essential (primary) hypertension: Secondary | ICD-10-CM | POA: Diagnosis not present

## 2022-11-13 DIAGNOSIS — E119 Type 2 diabetes mellitus without complications: Secondary | ICD-10-CM

## 2022-11-13 DIAGNOSIS — I5032 Chronic diastolic (congestive) heart failure: Secondary | ICD-10-CM | POA: Diagnosis not present

## 2022-11-13 NOTE — Progress Notes (Signed)
Provider:  Jacalyn Lefevre, MD  Careteam: Patient Care Team: Frederica Kuster, MD as PCP - General (Family Medicine) Croitoru, Rachelle Hora, MD as PCP - Cardiology (Cardiology) Leslye Peer, MD as Consulting Physician (Pulmonary Disease) Blima Ledger, OD (Optometry) Mitchel Honour, DO as Consulting Physician (Obstetrics and Gynecology)  PLACE OF SERVICE:  [facility]IC  Advanced Directive information    Allergies  Allergen Reactions   Codeine Nausea And Vomiting and Nausea Only   Demerol Hcl [Meperidine] Nausea Only   Ketorolac Nausea And Vomiting, Nausea Only and Other (See Comments)    "pt told to never take this med"   Demerol Nausea And Vomiting   Multaq [Dronedarone] Other (See Comments)    Increased fluid retention   Flexeril [Cyclobenzaprine] Nausea And Vomiting   Latex Rash    Pt stated on 09/20/2022    Chief Complaint  Patient presents with   Medical Management of Chronic Issues    Patient presents today for a 5 month follow-up   Quality Metric Gaps    Urine microalbumin, foot & eye exam, AWV, COVID#5, zoster, pneumonia      HPI: Patient is a 77 y.o. female who is here for medical management of chronic problems including hypothyroidism.  She is status post thyroidectomy.  Other problems include diabetes, congestive heart failure and persistent A-fib, sleep apnea using CPAP.  She also has chronic kidney disease with GFR 41 and creatinine 1.34 She is had recent eye appointment and has no changes per ophthalmology  Review of Systems:  Review of Systems  Constitutional: Negative.   Eyes: Negative.   Respiratory:  Positive for shortness of breath.   Cardiovascular: Negative.   Musculoskeletal: Negative.   Neurological: Negative.   Psychiatric/Behavioral: Negative.    All other systems reviewed and are negative.   Past Medical History:  Diagnosis Date   Acute on chronic diastolic CHF (congestive heart failure), NYHA class 1 (HCC) 04/29/2013   Allergic  rhinitis    Brady-tachy syndrome (HCC)    s/p STJ dual chamber PPM by Dr [name]   CAD (coronary artery disease)    70% RCA stenosis, treated medically   Control of atrial fibrillation with pacemaker (HCC)    Diabetes mellitus (HCC)    type 2   DJD (degenerative joint disease)    Dyspnea    occasional   Hearing loss    Right ear only - no hearing aids   Hypertension    Hyperthyroidism    Paroxysmal atrial fibrillation (HCC)    Pacemaker [employer] Jude   Pneumonia    years ago- before 2012 per patient   Polio    as a child   PONV (postoperative nausea and vomiting)    Presence of permanent cardiac pacemaker    St Jude pacemaker   Pulmonary disease    Sleep apnea    uses  CPAP nightly   Past Surgical History:  Procedure Laterality Date   arm surgery d/t fx Right 1996   ATRIAL FIBRILLATION ABLATION  07/31/2012; 08-10-13   PVI by Dr [name]   ATRIAL FIBRILLATION ABLATION N/A 07/31/2012   Procedure: ATRIAL FIBRILLATION ABLATION;  Surgeon: Hillis Range, MD;  Location: [facility] CATH LAB;  Service: Cardiovascular;  Laterality: N/A;   ATRIAL FIBRILLATION ABLATION N/A 08/10/2013   Procedure: ATRIAL FIBRILLATION ABLATION;  Surgeon: Gardiner Rhyme, MD;  Location: [facility] CATH LAB;  Service: Cardiovascular;  Laterality: N/A;   BACK SURGERY  07/26/2012   [facility]   CARDIAC CATHETERIZATION  12/03/2010  single vessel mid RCA   CARDIAC ELECTROPHYSIOLOGY MAPPING AND ABLATION  07/31/2012   Dr. Hillis Range   CESAREAN SECTION  09/20/1974   Dr. Greta Doom   CHOLECYSTECTOMY  1994   COLONOSCOPY WITH PROPOFOL N/A 03/21/2015   Procedure: COLONOSCOPY WITH PROPOFOL;  Surgeon: Charna Elizabeth, MD;  Location: [facility] ENDOSCOPY;  Service: Endoscopy;  Laterality: N/A;   DIAGNOSTIC MAMMOGRAM  04/16/2017   Tylene Fantasia. Morris, DO   EYE SURGERY Bilateral    remove cataracts   LAPAROSCOPIC HYSTERECTOMY  Late 9s to early 32s   LEFT HEART CATH AND CORONARY ANGIOGRAPHY N/A 07/30/2017   Procedure: LEFT HEART CATH AND CORONARY  ANGIOGRAPHY;  Surgeon: Lennette Bihari, MD;  Location: [facility] INVASIVE CV LAB;  Service: Cardiovascular;  Laterality: N/A;   LUMBAR LAMINECTOMY/DECOMPRESSION MICRODISCECTOMY Right 12/23/2012   Procedure: Right L5-S1 Laminotomy for resection of synovial cyst;  Surgeon: [name], MD;  Location: [facility] NEURO ORS;  Service: Neurosurgery;  Laterality: Right;  Right Lumbar five-sacral one Laminotomy for resection of synovial cyst   NM MYOCAR PERF WALL MOTION  12/20/2010   normal   PACEMAKER IMPLANT N/A 08/09/2019   Procedure: PACEMAKER IMPLANT;  Surgeon: [name], MD;  Location: [facility] INVASIVE CV LAB;  Service: Cardiovascular;  Laterality: N/A;   PACEMAKER INSERTION  12/03/2010   SJM Accent DR RF implanted by Dr [name]   RIGHT HEART CATH N/A 04/22/2018   Procedure: RIGHT HEART CATH;  Surgeon: Iran Ouch, MD;  Location: [facility] INVASIVE CV LAB;  Service: Cardiovascular;  Laterality: N/A;   RIGHT HEART CATH N/A 07/09/2022   Procedure: RIGHT HEART CATH;  Surgeon: Dolores Patty, MD;  Location: [facility] INVASIVE CV LAB;  Service: Cardiovascular;  Laterality: N/A;   TEE WITHOUT CARDIOVERSION N/A 07/30/2012   Procedure: TRANSESOPHAGEAL ECHOCARDIOGRAM (TEE);  Surgeon: [name], MD;  Location: [facility] ENDOSCOPY;  Service: Cardiovascular;  Laterality: N/A;  h&p in file-hope   TEE WITHOUT CARDIOVERSION N/A 08/09/2013   Procedure: TRANSESOPHAGEAL ECHOCARDIOGRAM (TEE);  Surgeon: Lewayne Bunting, MD;  Location: [facility] ENDOSCOPY;  Service: Cardiovascular;  Laterality: N/A;   THYROID LOBECTOMY Right 09/20/2022   Procedure: RIGHT HEMITHYROIDECTOMY;  Surgeon: Laren Boom, DO;  Location: [facility] OR;  Service: ENT;  Laterality: Right;   TOTAL ABDOMINAL HYSTERECTOMY  1981   ULTRASOUND GUIDANCE FOR VASCULAR ACCESS  04/22/2018   Procedure: Ultrasound Guidance For Vascular Access;  Surgeon: Iran Ouch, MD;  Location: [facility] INVASIVE CV LAB;  Service: Cardiovascular;;   Social History:   reports that she has  never smoked. She has never used smokeless tobacco. She reports that she does not drink alcohol and does not use drugs.  Family History  Problem Relation Age of Onset   Cancer Brother    Heart attack Sister    Breast cancer Sister    Diabetes Sister    Heart disease Sister    Breast cancer Sister    Dementia Sister    Colon cancer Sister    Heart disease Sister    Breast cancer Sister    Suicidality Brother    Prostate cancer Brother    Dementia Brother     Medications: Patient's Medications  New Prescriptions   No medications on file  Previous Medications   ACETAMINOPHEN (TYLENOL) 500 MG TABLET    Take 500-1,000 mg by mouth every 6 (six) hours as needed (pain.).   CHOLECALCIFEROL (VITAMIN D3) 1000 UNITS CAPS    Take 1,000 Units by mouth in the morning.   CRANBERRY 500 MG  CAPS    Take 500 mg by mouth daily as needed (Yeast infection).   DAPAGLIFLOZIN PROPANEDIOL (FARXIGA) 10 MG TABS TABLET    Take 1 tablet (10 mg total) by mouth daily before breakfast.   FLUCONAZOLE (DIFLUCAN) 150 MG TABLET    Take 1 tablet (150 mg total) by mouth daily as needed. Use one tablet as needed throughout the year for flare ups   FUROSEMIDE (LASIX) 80 MG TABLET    TAKE ONE AND 1/2 TABLETS BY MOUTH ONCE DAILY   GLUCOSE BLOOD (ONETOUCH ULTRA) TEST STRIP    USE DAILY TO CHECK BLOOD SUGAR E11.8   LANCETS MISC. (ONE TOUCH SURESOFT) MISC    Use to test  Blood sugar once daily DX: E11.8,   LOPERAMIDE (IMODIUM) 2 MG CAPSULE    Take 2 mg by mouth daily as needed for diarrhea or loose stools.   METHIMAZOLE (TAPAZOLE) 10 MG TABLET    TAKE 1 TABLET BY MOUTH ONCE DAILY WITH FOOD FOR THYROID   METOPROLOL TARTRATE (LOPRESSOR) 50 MG TABLET    TAKE 1 TABLET BY MOUTH ONCE DAILY IN THE MORNING THEN TAKE 1/2 (ONE-HALF) TABLET ONCE DAILY IN THE EVENING   ONDANSETRON (ZOFRAN) 4 MG TABLET    Take 1 tablet (4 mg total) by mouth every 8 (eight) hours as needed for nausea or vomiting.   OXYBUTYNIN (DITROPAN) 5 MG TABLET    TAKE  ONE TABLET BY MOUTH EVERY DAY   PANTOPRAZOLE (PROTONIX) 40 MG TABLET    Take 1 tablet (40 mg total) by mouth daily.   POTASSIUM CHLORIDE SA (KLOR-CON M) 20 MEQ TABLET    TAKE ONE TABLET BY MOUTH TWICE DAILY   SILDENAFIL (REVATIO) 20 MG TABLET    Take 1 tablet (20 mg total) by mouth 3 (three) times daily.   SITAGLIPTIN (JANUVIA) 100 MG TABLET    Take 1 tablet (100 mg total) by mouth daily.   SPIRONOLACTONE (ALDACTONE) 25 MG TABLET    Take 1 tablet (25 mg total) by mouth daily.  Modified Medications   No medications on file  Discontinued Medications   No medications on file    Physical Exam:  Vitals:   11/13/22 1326  BP: 118/72  Pulse: 60  Temp: (!) 97 F (36.1 C)  SpO2: 98%  Weight: 122 lb (55.3 kg)  Height: 4\' 11"  (1.499 m)   Body mass index is 24.64 kg/m. Wt Readings from Last 3 Encounters:  11/13/22 122 lb (55.3 kg)  09/20/22 121 lb (54.9 kg)  09/16/22 124 lb 9.6 oz (56.5 kg)    Physical Exam Vitals and nursing note reviewed.  Constitutional:      Appearance: Normal appearance.  Cardiovascular:     Rate and Rhythm: Normal rate and regular rhythm.  Pulmonary:     Effort: Pulmonary effort is normal.     Breath sounds: Normal breath sounds.  Musculoskeletal:        General: Normal range of motion.  Neurological:     General: No focal deficit present.     Mental Status: She is alert and oriented to person, place, and time.     Labs reviewed: Basic Metabolic Panel: Recent Labs    07/16/22 1655 08/01/22 1434 09/16/22 1431 11/01/22 1233  NA 136 136 133* 134  K 4.4 4.5 4.5 4.7  CL 95* 98 96* 94*  CO2 25 22 28 20   GLUCOSE 161* 139* 107* 113*  BUN 15 15 19 18   CREATININE 1.25* 1.37* 1.26* 1.34*  CALCIUM 9.5  9.5 9.1 9.1  MG 2.2 2.3  --  2.4*   Liver Function Tests: Recent Labs    08/01/22 1434  AST 17  ALT 8  ALKPHOS 123*  BILITOT 1.2  PROT 6.7  ALBUMIN 4.4   No results for input(s): "LIPASE", "AMYLASE" in the last 8760 hours. No results for  input(s): "AMMONIA" in the last 8760 hours. CBC: Recent Labs    07/09/22 0804 07/09/22 0923 08/01/22 1434 09/16/22 1431  WBC 5.9  --  7.9 8.8  HGB 11.8* 12.2  11.9* 12.2 12.9  HCT 37.7 36.0  35.0* 38.5 41.0  MCV 90.0  --  87 86.0  PLT 236  --  279 282   Lipid Panel: No results for input(s): "CHOL", "HDL", "LDLCALC", "TRIG", "CHOLHDL", "LDLDIRECT" in the last 8760 hours. TSH: No results for input(s): "TSH" in the last 8760 hours. A1C: Lab Results  Component Value Date   HGBA1C 6.3 (H) 09/16/2022     Assessment/Plan  1. Chronic diastolic heart failure (HCC) Lasix recently increased by cardiology also takes spironolactone  2. Type 2 diabetes mellitus without complication, without long-term current use of insulin (HCC) Medications include Farxiga and Januvia last A1c 2 months ago was 6.3  3. Essential hypertension Medications include metoprolol and lisinopril  4. Hyperthyroidism Status post thyroidectomy.  Has new prescription for  5. Obstructive sleep apnea Wears CPAP and tolerates   Jacalyn Lefevre, MD [facility] [phone]

## 2022-11-19 ENCOUNTER — Ambulatory Visit: Payer: Medicare Other | Attending: Adult Health | Admitting: Adult Health

## 2022-11-19 ENCOUNTER — Encounter: Payer: Self-pay | Admitting: Adult Health

## 2022-11-19 VITALS — BP 126/52 | HR 71 | Ht 59.0 in | Wt 122.6 lb

## 2022-11-19 DIAGNOSIS — Z79899 Other long term (current) drug therapy: Secondary | ICD-10-CM

## 2022-11-19 DIAGNOSIS — I2510 Atherosclerotic heart disease of native coronary artery without angina pectoris: Secondary | ICD-10-CM

## 2022-11-19 DIAGNOSIS — Z950 Presence of cardiac pacemaker: Secondary | ICD-10-CM

## 2022-11-19 DIAGNOSIS — I10 Essential (primary) hypertension: Secondary | ICD-10-CM

## 2022-11-19 DIAGNOSIS — I4821 Permanent atrial fibrillation: Secondary | ICD-10-CM | POA: Diagnosis not present

## 2022-11-19 DIAGNOSIS — I5032 Chronic diastolic (congestive) heart failure: Secondary | ICD-10-CM

## 2022-11-19 DIAGNOSIS — E7800 Pure hypercholesterolemia, unspecified: Secondary | ICD-10-CM | POA: Diagnosis not present

## 2022-11-19 NOTE — Patient Instructions (Signed)
Medication Instructions:  No Changes *If you need a refill on your cardiac medications before your next appointment, please call your pharmacy*   Lab Work: BMET, Magnesium If you have labs (blood work) drawn today and your tests are completely normal, you will receive your results only by: MyChart Message (if you have MyChart) OR A paper copy in the mail If you have any lab test that is abnormal or we need to change your treatment, we will call you to review the results.   Testing/Procedures: No Testing   Follow-Up: At [facility], you and your health needs are our priority.  As part of our continuing mission to provide you with exceptional heart care, we have created designated Provider Care Teams.  These Care Teams include your primary Cardiologist (physician) and Advanced Practice Providers (APPs -  Physician Assistants and Nurse Practitioners) who all work together to provide you with the care you need, when you need it.  We recommend signing up for the patient portal called "MyChart".  Sign up information is provided on this After Visit Summary.  MyChart is used to connect with patients for Virtual Visits (Telemedicine).  Patients are able to view lab/test results, encounter notes, upcoming appointments, etc.  Non-urgent messages can be sent to your provider as well.   To learn more about what you can do with MyChart, go to [email or web address].    Your next appointment:   3 month(s)  Provider:   [name], MD

## 2022-11-20 ENCOUNTER — Telehealth: Payer: Self-pay

## 2022-11-20 ENCOUNTER — Telehealth: Payer: Self-pay | Admitting: Adult Health

## 2022-11-20 LAB — BASIC METABOLIC PANEL
BUN/Creatinine Ratio: 14 (ref 12–28)
BUN: 20 mg/dL (ref 8–27)
CO2: 22 mmol/L (ref 20–29)
Calcium: 9 mg/dL (ref 8.7–10.3)
Chloride: 95 mmol/L — ABNORMAL LOW (ref 96–106)
Creatinine, Ser: 1.41 mg/dL — ABNORMAL HIGH (ref 0.57–1.00)
Glucose: 94 mg/dL (ref 70–99)
Potassium: 4.3 mmol/L (ref 3.5–5.2)
Sodium: 135 mmol/L (ref 134–144)
eGFR: 38 mL/min/{1.73_m2} — ABNORMAL LOW (ref >59)

## 2022-11-20 LAB — MAGNESIUM: Magnesium: 2.5 mg/dL — ABNORMAL HIGH (ref 1.6–2.3)

## 2022-11-20 NOTE — Telephone Encounter (Signed)
Patient is aware of Dr.Miller's directive and verbalized understanding. Medication list updated (Januvia discontinued)

## 2022-11-20 NOTE — Telephone Encounter (Signed)
Called patient to advise her to stop taking the Januvia 100 mg and keep a check on blood sugars reading per Dr.Miller. She didn't answer and the message was left to return call.

## 2022-11-20 NOTE — Telephone Encounter (Signed)
The patient has been notified of the result and verbalized understanding.  All questions (if any) were answered. Asencion Gowda, LPN [id number]/1191 4:78 PM

## 2022-11-20 NOTE — Telephone Encounter (Signed)
Patient is returning CMA's call to discuss her lab results. Please advise.

## 2022-11-20 NOTE — Telephone Encounter (Signed)
Refer to phone note from cardiology office with today's date  Patient called to inquiry about being on [place] and Farxiga. The Januvia is prescribed by Dr.Miler and the Marcelline Deist is prescribed by Croitoru, Rachelle Hora, MD, patient was told by staff at cardiology office that medications are similar and should be further discussed with her PCP   Please advise

## 2022-11-20 NOTE — Telephone Encounter (Addendum)
Called patient regarding results. Left message for patient to call office.----- Message from Jodelle Gross, NP sent at 11/20/2022  8:44 AM EDT ----- Reviewed labs. Magnesium and Potassium are normal.  Continue the replacement medications as she is taking. Should speak with PCP about being on both [place] as they are both the same medication.  She stated that her PCP wanted her on both.  KL

## 2022-11-26 DIAGNOSIS — Z515 Encounter for palliative care: Secondary | ICD-10-CM | POA: Diagnosis not present

## 2022-11-26 DIAGNOSIS — I5032 Chronic diastolic (congestive) heart failure: Secondary | ICD-10-CM | POA: Diagnosis not present

## 2022-12-02 NOTE — Progress Notes (Signed)
Remote pacemaker transmission.   

## 2022-12-04 ENCOUNTER — Telehealth: Payer: Self-pay

## 2022-12-04 NOTE — Telephone Encounter (Signed)
May try Tonic water. It has quinine in it and may help muscle cramping

## 2022-12-04 NOTE — Telephone Encounter (Signed)
Patient called and left message on clinical intake voicemail stating that she has been having cramps in her legs all night. She says that she is going to try showers,but she has stretched, walked and taking aspirin. She would like to know what else can she do.Please advise.  Message sent to Dr. Jacalyn Lefevre

## 2022-12-06 ENCOUNTER — Telehealth: Payer: Self-pay

## 2022-12-06 NOTE — Telephone Encounter (Addendum)
Called patient regarding results. Left message for patient regarding results. Letter mailed 12/06/22 ----- Message from Jodelle Gross, NP sent at 11/20/2022  8:44 AM EDT ----- Reviewed labs. Magnesium and Potassium are normal.  Continue the replacement medications as she is taking. Should speak with PCP about being on both [place] as they are both the same medication.  She stated that her PCP wanted her on both.  KL

## 2022-12-16 ENCOUNTER — Other Ambulatory Visit (HOSPITAL_COMMUNITY): Payer: Self-pay | Admitting: Otolaryngology

## 2022-12-16 DIAGNOSIS — E042 Nontoxic multinodular goiter: Secondary | ICD-10-CM

## 2022-12-16 DIAGNOSIS — Z9889 Other specified postprocedural states: Secondary | ICD-10-CM

## 2022-12-19 ENCOUNTER — Other Ambulatory Visit: Payer: Self-pay | Admitting: Emergency Medicine

## 2022-12-19 DIAGNOSIS — I5033 Acute on chronic diastolic (congestive) heart failure: Secondary | ICD-10-CM

## 2022-12-19 MED ORDER — FUROSEMIDE 80 MG PO TABS: 80.0000 mg | ORAL_TABLET | Freq: Every morning | ORAL | 3 refills | Status: DC

## 2022-12-19 NOTE — Progress Notes (Signed)
Problems with muscle cramps. No edema or dyspnea. Reduce the furosemide to 80 mg daily.

## 2022-12-31 ENCOUNTER — Ambulatory Visit (HOSPITAL_COMMUNITY): Payer: Medicare Other

## 2023-01-03 DIAGNOSIS — H35033 Hypertensive retinopathy, bilateral: Secondary | ICD-10-CM | POA: Diagnosis not present

## 2023-01-03 DIAGNOSIS — H5212 Myopia, left eye: Secondary | ICD-10-CM | POA: Diagnosis not present

## 2023-01-03 DIAGNOSIS — H524 Presbyopia: Secondary | ICD-10-CM | POA: Diagnosis not present

## 2023-01-03 DIAGNOSIS — H52221 Regular astigmatism, right eye: Secondary | ICD-10-CM | POA: Diagnosis not present

## 2023-01-03 DIAGNOSIS — I10 Essential (primary) hypertension: Secondary | ICD-10-CM | POA: Diagnosis not present

## 2023-01-03 DIAGNOSIS — H5201 Hypermetropia, right eye: Secondary | ICD-10-CM | POA: Diagnosis not present

## 2023-01-03 DIAGNOSIS — H53143 Visual discomfort, bilateral: Secondary | ICD-10-CM | POA: Diagnosis not present

## 2023-01-03 DIAGNOSIS — H26493 Other secondary cataract, bilateral: Secondary | ICD-10-CM | POA: Diagnosis not present

## 2023-01-03 DIAGNOSIS — E119 Type 2 diabetes mellitus without complications: Secondary | ICD-10-CM | POA: Diagnosis not present

## 2023-01-03 DIAGNOSIS — Z961 Presence of intraocular lens: Secondary | ICD-10-CM | POA: Diagnosis not present

## 2023-01-03 LAB — HM DIABETES EYE EXAM

## 2023-01-06 DIAGNOSIS — Z515 Encounter for palliative care: Secondary | ICD-10-CM | POA: Diagnosis not present

## 2023-01-06 DIAGNOSIS — I5032 Chronic diastolic (congestive) heart failure: Secondary | ICD-10-CM | POA: Diagnosis not present

## 2023-01-07 ENCOUNTER — Other Ambulatory Visit (HOSPITAL_COMMUNITY): Payer: Medicare Other

## 2023-01-07 ENCOUNTER — Other Ambulatory Visit: Payer: Medicare Other

## 2023-01-08 DIAGNOSIS — E042 Nontoxic multinodular goiter: Secondary | ICD-10-CM | POA: Diagnosis not present

## 2023-01-13 ENCOUNTER — Ambulatory Visit: Payer: Medicare Other

## 2023-01-13 DIAGNOSIS — E042 Nontoxic multinodular goiter: Secondary | ICD-10-CM | POA: Diagnosis not present

## 2023-01-13 DIAGNOSIS — I700 Atherosclerosis of aorta: Secondary | ICD-10-CM | POA: Diagnosis not present

## 2023-01-13 DIAGNOSIS — Z471 Aftercare following joint replacement surgery: Secondary | ICD-10-CM | POA: Diagnosis not present

## 2023-01-13 DIAGNOSIS — Z9889 Other specified postprocedural states: Secondary | ICD-10-CM

## 2023-01-13 LAB — I-STAT CREATININE (MANUAL ENTRY): Creatinine, Ser: 1.4 — AB (ref 0.50–1.10)

## 2023-01-13 MED ORDER — IOHEXOL 300 MG/ML  SOLN
100.0000 mL | Freq: Once | INTRAMUSCULAR | Status: AC | PRN
  Administered 2023-01-13: 60 mL via INTRAVENOUS

## 2023-02-04 ENCOUNTER — Other Ambulatory Visit: Payer: Self-pay | Admitting: Cardiovascular Disease

## 2023-02-04 DIAGNOSIS — I272 Pulmonary hypertension, unspecified: Secondary | ICD-10-CM

## 2023-02-05 ENCOUNTER — Ambulatory Visit (INDEPENDENT_AMBULATORY_CARE_PROVIDER_SITE_OTHER): Payer: Medicare Other

## 2023-02-05 DIAGNOSIS — I495 Sick sinus syndrome: Secondary | ICD-10-CM | POA: Diagnosis not present

## 2023-02-06 ENCOUNTER — Other Ambulatory Visit: Payer: Self-pay | Admitting: Cardiovascular Disease

## 2023-02-06 DIAGNOSIS — N3281 Overactive bladder: Secondary | ICD-10-CM

## 2023-02-06 LAB — CUP PACEART REMOTE DEVICE CHECK
Battery Remaining Longevity: 106 mo
Battery Remaining Percentage: 73 %
Battery Voltage: 3.01 V
Brady Statistic RV Percent Paced: 97 %
Date Time Interrogation Session: 20240904040016
Implantable Lead Connection Status: 753985
Implantable Lead Connection Status: 753985
Implantable Lead Connection Status: 753985
Implantable Lead Implant Date: 20120702
Implantable Lead Implant Date: 20120702
Implantable Lead Implant Date: 20210308
Implantable Lead Location: 753859
Implantable Lead Location: 753860
Implantable Lead Location: 753860
Implantable Pulse Generator Implant Date: 20210308
Lead Channel Impedance Value: 690 Ohm
Lead Channel Pacing Threshold Amplitude: 0.5 V
Lead Channel Pacing Threshold Pulse Width: 0.5 ms
Lead Channel Sensing Intrinsic Amplitude: 12 mV
Lead Channel Setting Pacing Amplitude: 0.75 V
Lead Channel Setting Pacing Pulse Width: 0.5 ms
Lead Channel Setting Sensing Sensitivity: 2 mV
Pulse Gen Model: 2272
Pulse Gen Serial Number: 3801383

## 2023-02-18 ENCOUNTER — Other Ambulatory Visit: Payer: Self-pay

## 2023-02-18 DIAGNOSIS — R112 Nausea with vomiting, unspecified: Secondary | ICD-10-CM

## 2023-02-18 MED ORDER — ONDANSETRON HCL 4 MG PO TABS: 4.0000 mg | ORAL_TABLET | Freq: Three times a day (TID) | ORAL | 0 refills | Status: DC | PRN

## 2023-02-18 NOTE — Progress Notes (Signed)
Remote pacemaker transmission.   

## 2023-02-19 ENCOUNTER — Ambulatory Visit: Payer: Medicare Other | Admitting: Sports Medicine

## 2023-02-26 ENCOUNTER — Encounter: Payer: Self-pay | Admitting: Emergency Medicine

## 2023-02-26 ENCOUNTER — Ambulatory Visit: Payer: Medicare Other | Admitting: Emergency Medicine

## 2023-02-26 VITALS — BP 130/68 | HR 63 | Ht 59.0 in | Wt 119.8 lb

## 2023-02-26 DIAGNOSIS — G4733 Obstructive sleep apnea (adult) (pediatric): Secondary | ICD-10-CM

## 2023-02-26 DIAGNOSIS — I2721 Secondary pulmonary arterial hypertension: Secondary | ICD-10-CM

## 2023-02-26 NOTE — Assessment & Plan Note (Signed)
Continue sildenafil 3 times a day.  If you continue to have itching with this medication we may need to discuss with cardiology on adjust.

## 2023-02-26 NOTE — Assessment & Plan Note (Signed)
We will arrange for a mask fit appointment with [employer] to determine which mask fits the best, has a least leak, ensure that you have adequate supplies.  This is important for [place] to do since you have recently had thyroid surgery and now you are having more leakage with your mask Continue mask every night as you are able to tolerate.  Our goal is for you to wear it for least 4 hours every night Follow with APP in 2 months Follow Dr. Delton Coombes in 6 months, sooner if you have problems.

## 2023-02-26 NOTE — Patient Instructions (Addendum)
We will arrange for a mask fit appointment with [employer] to determine which mask fits the best, has a least leak, ensure that you have adequate supplies.  This is important for [place] to do since you have recently had thyroid surgery and now you are having more leakage with your mask Continue mask every night as you are able to tolerate.  Our goal is for you to wear it for least 4 hours every night Continue sildenafil 3 times a day.  If you continue to have itching with this medication we may need to discuss with cardiology on adjust. Follow with APP in 2 months Follow Dr. Delton Coombes in 6 months, sooner if you have problems.

## 2023-02-26 NOTE — Progress Notes (Signed)
Subjective:    Patient ID: Hailey [name], female    DOB: September 02, 1945, 77 y.o.   MRN: [id number]  HPI  ROV 06/26/2022 --77 year old woman with history restrictive lung disease and positive ANA of unclear significance, multifactorial secondary pulmonary hypertension in the setting of this as well as atrial fibrillation, chronic diastolic CHF, systolic hypertension.  Also with goiter (not impacted the trachea).  A swallowing evaluation showed mild rightward deviation esophagus, some temporary delay barium tablet.  She is planning to have thyroid surgery in February.   She has a new diagnosis of mild OSA and we started AutoSet CPAP 5-20 cmH2O with nasal pillows > difficult to tolerate so she changed to triangle nasal mask. Compliance data shows she wore 11 out of last 30 nights, but this is misleading because she had a sinus infection that impacted her usage for about 2 weeks. Her energy level is a bit better on CPAP.   ROV 02/26/2023 --follow-up visit for 77 year old woman with a history of multifactorial dyspnea in the setting of restrictive disease, pulmonary hypertension from A-fib, chronic diastolic CHF and systolic hypertension with possible contribution of autoimmune disease.  She is on sildenafil.  She also has a goiter that has not impacted her trachea and a new diagnosis of mild OSA.  We started her on auto titration CPAP about 9 months ago.  She underwent right hemithyroidectomy 09/20/2022. Today she reports fairly good compliance with her CPAP for the last few months until early September.  She has been dealing with leakage.  Compliance report 12/27/2022-02/24/2023 shows usage for 70% of the nights, 63% of the nights for greater than 4 hours. She did well with her thyroid sgy. Helped her swallowing.  She was doing well w the CPAP for several months. She went back to nasal pillows w her surgery > has helped her feel better during the day. Less SOB, better exertional tolerance.    Review of Systems   Constitutional:  Positive for unexpected weight change. Negative for fever.  HENT:  Negative for congestion, dental problem, ear pain, nosebleeds, postnasal drip, rhinorrhea, sinus pressure, sneezing, sore throat and trouble swallowing.   Eyes:  Negative for redness and itching.  Respiratory:  Positive for cough and shortness of breath. Negative for chest tightness and wheezing.   Cardiovascular:  Negative for palpitations and leg swelling.  Gastrointestinal:  Negative for nausea and vomiting.  Genitourinary:  Negative for dysuria.  Musculoskeletal:  Negative for joint swelling.  Skin:  Negative for rash.  Allergic/Immunologic: Negative.  Negative for environmental allergies, food allergies and immunocompromised state.  Neurological:  Negative for headaches.  Hematological:  Does not bruise/bleed easily.  Psychiatric/Behavioral:  Negative for dysphoric mood. The patient is not nervous/anxious.      Past Medical History:  Diagnosis Date   Acute on chronic diastolic CHF (congestive heart failure), NYHA class 1 (HCC) 04/29/2013   Allergic rhinitis    Brady-tachy syndrome (HCC)    s/p STJ dual chamber PPM by Dr [name]   CAD (coronary artery disease)    70% RCA stenosis, treated medically   Control of atrial fibrillation with pacemaker (HCC)    Diabetes mellitus (HCC)    type 2   DJD (degenerative joint disease)    Dyspnea    occasional   Hearing loss    Right ear only - no hearing aids   Hypertension    Hyperthyroidism    Paroxysmal atrial fibrillation (HCC)    Pacemaker [employer]   Pneumonia  years ago- before 2012 per patient   Polio    as a child   PONV (postoperative nausea and vomiting)    Presence of permanent cardiac pacemaker    St Jude pacemaker   Pulmonary disease    Sleep apnea    uses  CPAP nightly     Family History  Problem Relation Age of Onset   Cancer Brother    Heart attack Sister    Breast cancer Sister    Diabetes Sister    Heart disease  Sister    Breast cancer Sister    Dementia Sister    Colon cancer Sister    Heart disease Sister    Breast cancer Sister    Suicidality Brother    Prostate cancer Brother    Dementia Brother      Social History   Socioeconomic History   Marital status: Married    Spouse name: Loistine Chance   Number of children: 3   Years of education: 13   Highest education level: Some college, no degree  Occupational History   Occupation: housewife  Tobacco Use   Smoking status: Never   Smokeless tobacco: Never  Vaping Use   Vaping status: Never Used  Substance and Sexual Activity   Alcohol use: No   Drug use: No   Sexual activity: Not Currently    Birth control/protection: Abstinence, Surgical    Comment: Hysterectomy  Other Topics Concern   Not on file  Social History Narrative   Lives in [place].  Retired.  Was office clerk in several companies.  Did some technical school after completing high school.  She has no pets.  She and her husband live in a one-story home.  They've been married since 1965.  She follows a heart healthy diet.  She has a living will and hcpoa but does not have a DNR form.  She does struggle to prepare food.     Social Determinants of Health   Financial Resource Strain: Not on file  Food Insecurity: Low Risk  (11/11/2022)   [facility]   Hunger Vital Sign    Worried About Running Out of Food in the Last Year: Never true    Ran Out of Food in the Last Year: Never true  Transportation Needs: No Transportation Needs (09/20/2022)   PRAPARE - Administrator, Civil Service (Medical): No    Lack of Transportation (Non-Medical): No  Physical Activity: Not on file  Stress: Not on file  Social Connections: Not on file  Intimate Partner Violence: Not At Risk (09/20/2022)   Humiliation, Afraid, Rape, and Kick questionnaire    Fear of Current or Ex-Partner: No    Emotionally Abused: No    Physically Abused: No    Sexually Abused:  No     Allergies  Allergen Reactions   Codeine Nausea And Vomiting and Nausea Only   Demerol Hcl [Meperidine] Nausea Only   Ketorolac Nausea And Vomiting, Nausea Only and Other (See Comments)    "pt told to never take this med"   Demerol Nausea And Vomiting   Multaq [Dronedarone] Other (See Comments)    Increased fluid retention   Flexeril [Cyclobenzaprine] Nausea And Vomiting   Latex Rash    Pt stated on 09/20/2022     Outpatient Medications Prior to Visit  Medication Sig Dispense Refill   acetaminophen (TYLENOL) 500 MG tablet Take 500-1,000 mg by mouth every 6 (six) hours as needed (pain.).  Cholecalciferol (VITAMIN D3) 1000 UNITS CAPS Take 1,000 Units by mouth in the morning.     Cranberry 500 MG CAPS Take 500 mg by mouth daily as needed (Yeast infection).     dapagliflozin propanediol (FARXIGA) 10 MG TABS tablet Take 1 tablet (10 mg total) by mouth daily before breakfast. 90 tablet 6   ELIQUIS 5 MG TABS tablet Take 5 mg by mouth 2 (two) times daily.     fluconazole (DIFLUCAN) 150 MG tablet Take 1 tablet (150 mg total) by mouth daily as needed. Use one tablet as needed throughout the year for flare ups 1 tablet 6   furosemide (LASIX) 80 MG tablet Take 1 tablet (80 mg total) by mouth every morning. 90 tablet 3   glucose blood (ONETOUCH ULTRA) test strip USE DAILY TO CHECK BLOOD SUGAR E11.8 100 each 11   Lancets Misc. (ONE TOUCH SURESOFT) MISC Use to test  Blood sugar once daily DX: E11.8, 1 each 0   levothyroxine (SYNTHROID) 25 MCG tablet Take 25 mcg by mouth daily before breakfast.     loperamide (IMODIUM) 2 MG capsule Take 2 mg by mouth daily as needed for diarrhea or loose stools.     magnesium oxide (MAG-OX) 400 (240 Mg) MG tablet Take 400 mg by mouth daily.     methimazole (TAPAZOLE) 10 MG tablet TAKE ONE TABLET BY MOUTH EVERY DAY WITH FOOD 90 tablet 1   metoprolol tartrate (LOPRESSOR) 50 MG tablet TAKE 1 TABLET BY MOUTH ONCE DAILY IN THE MORNING THEN TAKE 1/2 (ONE-HALF)  TABLET ONCE DAILY IN THE EVENING 135 tablet 2   ondansetron (ZOFRAN) 4 MG tablet Take 1 tablet (4 mg total) by mouth every 8 (eight) hours as needed for nausea or vomiting. 20 tablet 0   oxybutynin (DITROPAN) 5 MG tablet TAKE ONE TABLET BY MOUTH EVERY DAY 90 tablet 1   pantoprazole (PROTONIX) 40 MG tablet Take 1 tablet (40 mg total) by mouth daily. 90 tablet 3   potassium chloride SA (KLOR-CON M) 20 MEQ tablet TAKE ONE TABLET BY MOUTH TWICE DAILY 180 tablet 2   sildenafil (REVATIO) 20 MG tablet Take 1 tablet (20 mg total) by mouth 3 (three) times daily. Please call [phone] to schedule an appointment with Dr. [name] for future refills. Thank you. 90 tablet 0   spironolactone (ALDACTONE) 25 MG tablet Take 1 tablet (25 mg total) by mouth daily. 90 tablet 3   No facility-administered medications prior to visit.       Objective:   Physical Exam Vitals:   02/26/23 0834  BP: 130/68  Pulse: 63  SpO2: 98%  Weight: 119 lb 12.8 oz (54.3 kg)  Height: 4\' 11"  (1.499 m)     Gen: Pleasant, well-nourished elderly woman, in no distress,  normal affect  ENT: No lesions,  mouth clear,  oropharynx clear, no postnasal drip  Neck: JVD difficult to assess  Lungs: No use of accessory muscles, no crackles or wheezing on normal respiration, no wheeze on forced expiration  Cardiovascular: RRR, heart sounds normal, no murmur or gallops, no peripheral edema  Musculoskeletal: No deformities, no cyanosis or clubbing  Neuro: alert, awake, non focal  Skin: Warm, no lesions or rash      Assessment & Plan:  Obstructive sleep apnea We will arrange for a mask fit appointment with [employer] to determine which mask fits the best, has a least leak, ensure that you have adequate supplies.  This is important for [place] to do since you have recently had  thyroid surgery and now you are having more leakage with your mask Continue mask every night as you are able to tolerate.  Our goal is for you to wear it for least 4  hours every night Follow with APP in 2 months Follow Dr. Delton Coombes in 6 months, sooner if you have problems.  PAH (pulmonary artery hypertension) (HCC) Continue sildenafil 3 times a day.  If you continue to have itching with this medication we may need to discuss with cardiology on adjust.    Levy Pupa, MD, PhD 02/26/2023, 4:07 PM [facility] [phone] or if no answer [phone]

## 2023-03-03 NOTE — Progress Notes (Signed)
Patient ID: Hailey [name], female   DOB: [birth year], 77 y.o.   MRN: [id number]    Cardiology Office Note    Date:  03/08/2023   ID:  Hailey [name], Hailey [name] 09/27/1945, MRN [id number]  PCP:  [name], MD  Cardiologist:  Hillis Range, MD;  [name], MD   Chief Complaint  Patient presents with   Shortness of Breath    History of Present Illness:  Hailey [name] is a 77 y.o. female with longstanding persistent atrial fibrillation (s/p RF ablation procedures, now with longstanding persistent atrial fibrillation), s/p dual-chamber permanent pacemaker (St. Jude Accent 2012 for tachycardia-bradycardia syndrome and 2:1 atrioventricular block, generator change and new right ventricular lead for high pacing thresholds March 2021), chronic diastolic heart failure (history of acute decompensation on Multaq), multifactorial pulmonary artery hypertension, CAD (nonobstructive by cath February 2019), type 2 diabetes mellitus, hypertension, good lipid profile without therapy, s/p right hemithyroidectomy for large goiter, history of hyperthyroidism (no longer requiring meds).  She is having problems with decreased exercise tolerance, increased shortness of breath with activity.  She does not have orthopnea, PND or lower extremity edema.  She has not had problems with chest pain.  She complains of itching in her upper back that is sometimes intolerable, but has not had a rash.  It is worse at night.  She has had repeated problems with vaginal yeast infections and requires fluconazole at least every 2 months.  She also has a lot of problems with night cramps.  She has not had palpitations or syncope.  She has not had any problems with bleeding or falls.  Home situation remains difficult due to Phillip's progressive dementia.  He had a recent fall and required hip surgery and is currently in rehab.  Echo in January 2024 showed normal left ventricular systolic function but estimated her systolic PA  pressure to be about 60 mmHg.  Her right ventricle was moderately depressed and there was evidence of increased right heart filling pressures.  Right heart catheterization confirmed pulmonary hypertension 73/27 (mean 41), but there was also evidence of elevated left heart filling pressures with a wedge pressure of 23 (transpulmonary gradient 18).  PVR was 5.6 Woods units.  She had diminished cardiac output (index 2.1)  She has been in persistent atrial fibrillation with slow ventricular response for over 2 years.  Underlying rhythm is atrial fibrillation with ventricular rate in the 40s.  Her dual-chamber pacemaker is programmed VVIR.  The heart rate histograms look relatively flat.  She actually feels poorly with native rhythm and feels better when she has ventricular pacing (this was the case even before she developed atrial fibrillation).  Currently has 96% ventricular pacing.  Estimated gentle longevity is about 9 years.  She has not had any episodes of high ventricular rates.  Has seen Dr. Delton Coombes with a plan to improve compliance with CPAP.  echocardiogram July 2020 showed normal left ventricular systolic function and EF of 60-65%, pseudonormal mitral inflow, moderately enlarged right ventricle, biatrial mild dilation, estimated systolic PA pressure 40 mmHg.  Previous right heart catheterization showed that her pulmonary hypertension could not be explained exclusively based on elevated left heart filling pressures.  She was evaluated in the rheumatology clinic by Dr. Dierdre Forth since she had an elevated ANA of 1: 640, but all her other serological markers were negative for lupus, rheumatoid arthritis or other connective tissue diseases.  Chest CT has shown numerous bilateral pulmonary nodules felt to be nonspecific.  These have been  present back as far as 2012.    Repeat echo in January 2024 showed evidence of dilation and weakening of the right ventricle and right heart catheterization showed PA pressure  73/27 (mean 41), wedge pressure 23, transpulmonary gradient 18, PVR 5.6 Woods units, cardiac index 2.1, PAP 3.8, mixed venous sat 56-58%.  Past Medical History:  Diagnosis Date   Acute on chronic diastolic CHF (congestive heart failure), NYHA class 1 (HCC) 04/29/2013   Allergic rhinitis    Brady-tachy syndrome (HCC)    s/p STJ dual chamber PPM by Dr [name]   CAD (coronary artery disease)    70% RCA stenosis, treated medically   Control of atrial fibrillation with pacemaker (HCC)    Diabetes mellitus (HCC)    type 2   DJD (degenerative joint disease)    Dyspnea    occasional   Hearing loss    Right ear only - no hearing aids   Hypertension    Hyperthyroidism    Paroxysmal atrial fibrillation (HCC)    Pacemaker St Jude   Pneumonia    years ago- before 2012 per patient   Polio    as a child   PONV (postoperative nausea and vomiting)    Presence of permanent cardiac pacemaker    St Jude pacemaker   Pulmonary disease    Sleep apnea    uses  CPAP nightly    Past Surgical History:  Procedure Laterality Date   arm surgery d/t fx Right 1996   ATRIAL FIBRILLATION ABLATION  07/31/2012; 08-10-13   PVI by Dr [name]   ATRIAL FIBRILLATION ABLATION N/A 07/31/2012   Procedure: ATRIAL FIBRILLATION ABLATION;  Surgeon: Hillis Range, MD;  [facility] CATH LAB;  Service: Cardiovascular;  Laterality: N/A;   ATRIAL FIBRILLATION ABLATION N/A 08/10/2013   Procedure: ATRIAL FIBRILLATION ABLATION;  Surgeon: Gardiner Rhyme, MD;  Location: [facility] CATH LAB;  Service: Cardiovascular;  Laterality: N/A;   BACK SURGERY  07/26/2012   dR. [facility]   CARDIAC CATHETERIZATION  12/03/2010   single vessel mid RCA   CARDIAC ELECTROPHYSIOLOGY MAPPING AND ABLATION  07/31/2012   Dr. Hillis Range   CESAREAN SECTION  09/20/1974   Dr. Greta Doom   CHOLECYSTECTOMY  1994   COLONOSCOPY WITH PROPOFOL N/A 03/21/2015   Procedure: COLONOSCOPY WITH PROPOFOL;  Surgeon: Charna Elizabeth, MD;  Location: [facility] ENDOSCOPY;  Service:  Endoscopy;  Laterality: N/A;   DIAGNOSTIC MAMMOGRAM  04/16/2017   Tylene Fantasia. Morris, DO   EYE SURGERY Bilateral    remove cataracts   LAPAROSCOPIC HYSTERECTOMY  Late 73s to early 59s   LEFT HEART CATH AND CORONARY ANGIOGRAPHY N/A 07/30/2017   Procedure: LEFT HEART CATH AND CORONARY ANGIOGRAPHY;  Surgeon: Lennette Bihari, MD;  Location: [facility] INVASIVE CV LAB;  Service: Cardiovascular;  Laterality: N/A;   LUMBAR LAMINECTOMY/DECOMPRESSION MICRODISCECTOMY Right 12/23/2012   Procedure: Right L5-S1 Laminotomy for resection of synovial cyst;  Surgeon: [name], MD;  Location: [facility] NEURO ORS;  Service: Neurosurgery;  Laterality: Right;  Right Lumbar five-sacral one Laminotomy for resection of synovial cyst   NM MYOCAR PERF WALL MOTION  12/20/2010   normal   PACEMAKER IMPLANT N/A 08/09/2019   Procedure: PACEMAKER IMPLANT;  Surgeon: [name], MD;  Location: [facility] INVASIVE CV LAB;  Service: Cardiovascular;  Laterality: N/A;   PACEMAKER INSERTION  12/03/2010   SJM Accent DR RF implanted by Dr [name]   RIGHT HEART CATH N/A 04/22/2018   Procedure: RIGHT HEART CATH;  Surgeon: Iran Ouch, MD;  Location:  [facility] INVASIVE CV LAB;  Service: Cardiovascular;  Laterality: N/A;   RIGHT HEART CATH N/A 07/09/2022   Procedure: RIGHT HEART CATH;  Surgeon: Dolores Patty, MD;  Location: [facility] INVASIVE CV LAB;  Service: Cardiovascular;  Laterality: N/A;   TEE WITHOUT CARDIOVERSION N/A 07/30/2012   Procedure: TRANSESOPHAGEAL ECHOCARDIOGRAM (TEE);  Surgeon: [name], MD;  Location: [facility] ENDOSCOPY;  Service: Cardiovascular;  Laterality: N/A;  h&p in file-hope   TEE WITHOUT CARDIOVERSION N/A 08/09/2013   Procedure: TRANSESOPHAGEAL ECHOCARDIOGRAM (TEE);  Surgeon: Lewayne Bunting, MD;  Location: [facility] ENDOSCOPY;  Service: Cardiovascular;  Laterality: N/A;   THYROID LOBECTOMY Right 09/20/2022   Procedure: RIGHT HEMITHYROIDECTOMY;  Surgeon: Laren Boom, DO;  Location: [facility] OR;  Service: ENT;  Laterality:  Right;   TOTAL ABDOMINAL HYSTERECTOMY  1981   ULTRASOUND GUIDANCE FOR VASCULAR ACCESS  04/22/2018   Procedure: Ultrasound Guidance For Vascular Access;  Surgeon: Iran Ouch, MD;  Location: [facility] INVASIVE CV LAB;  Service: Cardiovascular;;    Current Medications: Outpatient Medications Prior to Visit  Medication Sig Dispense Refill   acetaminophen (TYLENOL) 500 MG tablet Take 500-1,000 mg by mouth every 6 (six) hours as needed (pain.).     Cholecalciferol (VITAMIN D3) 1000 UNITS CAPS Take 1,000 Units by mouth in the morning.     ELIQUIS 5 MG TABS tablet Take 5 mg by mouth 2 (two) times daily.     furosemide (LASIX) 80 MG tablet Take 1 tablet (80 mg total) by mouth every morning. 90 tablet 3   glucose blood (ONETOUCH ULTRA) test strip USE DAILY TO CHECK BLOOD SUGAR E11.8 100 each 11   Lancets Misc. (ONE TOUCH SURESOFT) MISC Use to test  Blood sugar once daily DX: E11.8, 1 each 0   levothyroxine (SYNTHROID) 25 MCG tablet Take 25 mcg by mouth daily before breakfast.     loperamide (IMODIUM) 2 MG capsule Take 2 mg by mouth daily as needed for diarrhea or loose stools.     magnesium oxide (MAG-OX) 400 (240 Mg) MG tablet Take 400 mg by mouth daily.     methimazole (TAPAZOLE) 10 MG tablet TAKE ONE TABLET BY MOUTH EVERY DAY WITH FOOD 90 tablet 1   metoprolol tartrate (LOPRESSOR) 50 MG tablet TAKE 1 TABLET BY MOUTH ONCE DAILY IN THE MORNING THEN TAKE 1/2 (ONE-HALF) TABLET ONCE DAILY IN THE EVENING 135 tablet 2   oxybutynin (DITROPAN) 5 MG tablet TAKE ONE TABLET BY MOUTH EVERY DAY 90 tablet 1   pantoprazole (PROTONIX) 40 MG tablet Take 1 tablet (40 mg total) by mouth daily. 90 tablet 3   potassium chloride SA (KLOR-CON M) 20 MEQ tablet TAKE ONE TABLET BY MOUTH TWICE DAILY 180 tablet 2   spironolactone (ALDACTONE) 25 MG tablet Take 1 tablet (25 mg total) by mouth daily. 90 tablet 3   dapagliflozin propanediol (FARXIGA) 10 MG TABS tablet Take 1 tablet (10 mg total) by mouth daily before breakfast. 90  tablet 6   sildenafil (REVATIO) 20 MG tablet Take 1 tablet (20 mg total) by mouth 3 (three) times daily. Please call [phone] to schedule an appointment with Dr. [name] for future refills. Thank you. 90 tablet 0   Cranberry 500 MG CAPS Take 500 mg by mouth daily as needed (Yeast infection). (Patient not taking: Reported on 03/04/2023)     fluconazole (DIFLUCAN) 150 MG tablet Take 1 tablet (150 mg total) by mouth daily as needed. Use one tablet as needed throughout the year for flare ups (Patient not taking: Reported  on 03/04/2023) 1 tablet 6   ondansetron (ZOFRAN) 4 MG tablet Take 1 tablet (4 mg total) by mouth every 8 (eight) hours as needed for nausea or vomiting. (Patient not taking: Reported on 03/04/2023) 20 tablet 0   No facility-administered medications prior to visit.     Allergies:   Codeine, Demerol hcl [meperidine], Ketorolac, Demerol, Multaq [dronedarone], Flexeril [cyclobenzaprine], Xarelto [rivaroxaban], and Latex   Social History   Socioeconomic History   Marital status: Married    Spouse name: Loistine Chance   Number of children: 3   Years of education: 13   Highest education level: Some college, no degree  Occupational History   Occupation: housewife  Tobacco Use   Smoking status: Never   Smokeless tobacco: Never  Vaping Use   Vaping status: Never Used  Substance and Sexual Activity   Alcohol use: No   Drug use: No   Sexual activity: Not Currently    Birth control/protection: Abstinence, Surgical    Comment: Hysterectomy  Other Topics Concern   Not on file  Social History Narrative   Lives in [place].  Retired.  Was office clerk in several companies.  Did some technical school after completing high school.  She has no pets.  She and her husband live in a one-story home.  They've been married since 1965.  She follows a heart healthy diet.  She has a living will and hcpoa but does not have a DNR form.  She does struggle to prepare food.     Social Determinants of  Health   Financial Resource Strain: Not on file  Food Insecurity: Low Risk  (11/11/2022)   [facility]   Hunger Vital Sign    Worried About Running Out of Food in the Last Year: Never true    Ran Out of Food in the Last Year: Never true  Transportation Needs: No Transportation Needs (09/20/2022)   PRAPARE - Administrator, Civil Service (Medical): No    Lack of Transportation (Non-Medical): No  Physical Activity: Not on file  Stress: Not on file  Social Connections: Not on file     Family History:  The patient's family history includes Breast cancer in her sister, sister, and sister; Cancer in her brother; Colon cancer in her sister; Dementia in her brother and sister; Diabetes in her sister; Heart attack in her sister; Heart disease in her sister and sister; Prostate cancer in her brother; Suicidality in her brother.   ROS:   Please see the history of present illness.    All other systems are reviewed and are negative.   PHYSICAL EXAM:   VS:  BP (!) 122/58 (BP Location: Left Arm, Patient Position: Sitting, Cuff Size: Normal)   Ht 4\' 11"  (1.499 m)   Wt 118 lb 6.4 oz (53.7 kg)   SpO2 98%   BMI 23.91 kg/m      General: Alert, oriented x3, no distress, ill to the subclavian pacemaker site Head: no evidence of trauma, PERRL, EOMI, no exophtalmos or lid lag, no myxedema, no xanthelasma; normal ears, nose and oropharynx Neck:  5 cm  jugular venous pulsations and no hepatojugular reflux; brisk carotid pulses without delay and no carotid bruits Chest: clear to auscultation, no signs of consolidation by percussion or palpation, normal fremitus, symmetrical and full respiratory excursions Cardiovascular: normal position and quality of the apical impulse, regular rhythm, normal first and paradoxically split second heart sounds, no murmurs, rubs or gallops Abdomen: no  tenderness or distention, no masses by palpation, no abnormal pulsatility or  arterial bruits, normal bowel sounds, no hepatosplenomegaly Extremities: no clubbing, cyanosis or edema; 2+ radial, ulnar and brachial pulses bilaterally; 2+ right femoral, posterior tibial and dorsalis pedis pulses; 2+ left femoral, posterior tibial and dorsalis pedis pulses; no subclavian or femoral bruits Neurological: grossly nonfocal Psych: Normal mood and affect    Wt Readings from Last 3 Encounters:  03/04/23 118 lb 6.4 oz (53.7 kg)  02/26/23 119 lb 12.8 oz (54.3 kg)  11/19/22 122 lb 9.6 oz (55.6 kg)     Studies Reviewed: Marland Kitchen     EKG Interpretation Date/Time:  Tuesday March 04 2023 12:02:58 EDT Ventricular Rate:  60 PR Interval:    QRS Duration:  120 QT Interval:  464 QTC Calculation: 464 R Axis:   -48  Text Interpretation: Ventricular-paced rhythm When compared with ECG of 09-Jul-2022 08:44, Electronic ventricular pacemaker has replaced Wide QRS rhythm Confirmed by Shacara Cozine ([id number]) on 03/04/2023 12:13:11 PM    Right heart catheterization 07/09/2022 RA = 12 RV = 76/11 PA = 73/27 (41) PCW = 23 Fick cardiac output/index = 3.2/2.1 PVR = 5.6 WU Ao sat = 96% PA sat = 56%, 58% PAPi = 3.8   Assessment: 1. Mixed moderate pulmonary venous and pulmonary arterial HTN with elevated filling pressures and low cardiac output   Plan/Discussion:    Options limited. Not candidate for selective pulmonary artery vasodilators. Would push diuretics as tolerated. Ensure oxygenation > 88% at all times. Consider pulmonary rehab.    Echocardiogram 07/01/2022  1. Evidence for moderate pulmonary HTN Estimated PAs pressure 62 mmHg.   2. Left ventricular ejection fraction, by estimation, is 60 to 65%. The  left ventricle has normal function. The left ventricle has no regional  wall motion abnormalities. Left ventricular diastolic parameters were  normal.   3. Catheter in RA/RV . Right ventricular systolic function is moderately  reduced. The right ventricular size is normal. There  is moderately  elevated pulmonary artery systolic pressure.   4. Left atrial size was mildly dilated.   5. Right atrial size was moderately dilated.   6. The mitral valve is abnormal. No evidence of mitral valve  regurgitation. No evidence of mitral stenosis.   7. Tricuspid valve regurgitation is severe.   8. The aortic valve is tricuspid. Aortic valve regurgitation is trivial.  No aortic stenosis is present.   9. The inferior vena cava is dilated in size with <50% respiratory  variability, suggesting right atrial pressure of 15 mmHg.   Risk Assessment/Calculations:    CHA2DS2-VASc Score = 7   This indicates a 11.2% annual risk of stroke. The patient's score is based upon: CHF History: 1 HTN History: 1 Diabetes History: 1 Stroke History: 0 Vascular Disease History: 1 Age Score: 2 Gender Score: 1         Lipid Panel    Component Value Date/Time   CHOL 125 11/13/2020 0955   TRIG 70 11/13/2020 0955   HDL 50 11/13/2020 0955   CHOLHDL 2.5 11/13/2020 0955   VLDL 10 11/24/2010 0837   LDLCALC 60 11/13/2020 0955   BMET    Component Value Date/Time   NA 135 11/19/2022 1418   K 4.3 11/19/2022 1418   CL 95 (L) 11/19/2022 1418   CO2 22 11/19/2022 1418   GLUCOSE 94 11/19/2022 1418   GLUCOSE 107 (H) 09/16/2022 1431   BUN 20 11/19/2022 1418   CREATININE 1.40 (A) 01/13/2023 1300   CREATININE 1.22 (  H) 11/13/2020 0955   CALCIUM 9.0 11/19/2022 1418   GFRNONAA 44 (L) 09/16/2022 1431   GFRNONAA 43 (L) 11/13/2020 0955   GFRAA 50 (L) 11/13/2020 0955   Hemoglobin A1c 6.3% on 09/16/2022 ASSESSMENT:    1. Chronic diastolic CHF (congestive heart failure), NYHA class 2 (HCC)   2. PAH (pulmonary artery hypertension) (HCC)   3. Permanent atrial fibrillation (HCC)   4. Acquired thrombophilia (HCC)   5. Second degree AV block   6. SSS (sick sinus syndrome) (HCC)   7. Pacemaker St. Jude accent DR RF , July 2012   8. Coronary artery disease involving native coronary artery of native  heart without angina pectoris   9. Controlled type 2 diabetes mellitus with complication, without long-term current use of insulin (HCC)   10. Thyroid goiter      PLAN:  In order of problems listed above:   CHF: Normal LV systolic function, but mildly elevated PCWP at cath consistent with diastolic heart failure.  Has frequent vaginal yeast infections and lots of muscle cramps, likely due to [place].  Will stop this.  Will probably need a higher dose of furosemide.  Keep an eye on weight and continue to follow a low-sodium diet.  Note that BNP was modestly elevated in February when she had her heart cath and 173. PAH: Although there is a component of left heart failure this is mostly precapillary hypertension.  The exact mechanism is uncertain although she has a markedly positive ANA without other findings to suggest systemic autoimmune disease.  No evidence of progressive interstitial lung disease by imaging studies.  In the past had a good clinical and echocardiographic response to chronic treatment with sildenafil, but now PA pressure has increased and there is evidence of right heart failure.  I think this is the major cause of her shortness of breath and limited exercise tolerance. AFib: Now a permanent arrhythmia with slow ventricular rates and virtually 100% ventricular pacing.   She has a remarkably narrow paced QRS complex and actually feels better with ventricular pacing compared to when we allow native AV conduction during threshold testing or when we check underlying rhythm.  CHADSVasc at least 7 (age 77, gender, DM, CAD, HTN, CHF). Anticoagulation: No bleeding complications with Eliquis.  Has a history of GI intolerance to Xarelto. 2nd deg AVB: 95% ventricular paced rhythm.  She feels best when she has ventricular pacing.  She felt extremely poorly when she lost ventricular capture in the past and she feels unwell when we check underlying rhythm or perform ventricular pacing threshold  checks.  SSS: Precedes diagnosis of AV block and persistent atrial fibrillation and was the initial reason for device implantation. PPM: Received a new generator and a new right ventricular lead in 2020.  Normal device check.  Will increase the rate response settings to see if this leads to any improvement in her activity tolerance.  Continue remote downloads every 3 months and yearly office visits or more frequently based on clinical status CAD: Denies angina.  Had nonobstructive disease at most recent cath in February 2019.  Recent lipid profile shows LDL cholesterol 60. DM: Good glycemic control. Goiter: s/p hemithyroidectomy, now euthyroid. Pruritus: There is no evidence of a rash.  I do not think this is an allergic response to sildenafil.  Will see if he gets better with discontinuation of Farxiga.  Reevaluate in a couple of months.         Dispo: Stop Marcelline Deist, return in 2 months.  Signed, [name], MD

## 2023-03-04 ENCOUNTER — Ambulatory Visit: Payer: Medicare Other | Attending: Cardiovascular Disease | Admitting: Cardiovascular Disease

## 2023-03-04 ENCOUNTER — Encounter: Payer: Self-pay | Admitting: Cardiovascular Disease

## 2023-03-04 VITALS — BP 122/58 | Ht 59.0 in | Wt 118.4 lb

## 2023-03-04 DIAGNOSIS — I495 Sick sinus syndrome: Secondary | ICD-10-CM | POA: Diagnosis not present

## 2023-03-04 DIAGNOSIS — I2721 Secondary pulmonary arterial hypertension: Secondary | ICD-10-CM | POA: Insufficient documentation

## 2023-03-04 DIAGNOSIS — Z950 Presence of cardiac pacemaker: Secondary | ICD-10-CM | POA: Insufficient documentation

## 2023-03-04 DIAGNOSIS — I4821 Permanent atrial fibrillation: Secondary | ICD-10-CM | POA: Insufficient documentation

## 2023-03-04 DIAGNOSIS — D6869 Other thrombophilia: Secondary | ICD-10-CM | POA: Insufficient documentation

## 2023-03-04 DIAGNOSIS — I441 Atrioventricular block, second degree: Secondary | ICD-10-CM | POA: Insufficient documentation

## 2023-03-04 DIAGNOSIS — I2510 Atherosclerotic heart disease of native coronary artery without angina pectoris: Secondary | ICD-10-CM | POA: Insufficient documentation

## 2023-03-04 DIAGNOSIS — I5032 Chronic diastolic (congestive) heart failure: Secondary | ICD-10-CM | POA: Insufficient documentation

## 2023-03-04 DIAGNOSIS — E049 Nontoxic goiter, unspecified: Secondary | ICD-10-CM | POA: Diagnosis not present

## 2023-03-04 DIAGNOSIS — E118 Type 2 diabetes mellitus with unspecified complications: Secondary | ICD-10-CM | POA: Insufficient documentation

## 2023-03-04 NOTE — Patient Instructions (Addendum)
Medication Instructions:  Stop Marcelline Deist *If you need a refill on your cardiac medications before your next appointment, please call your pharmacy*  Use a moisturizer on itchy skin  Follow-Up: At [facility], you and your health needs are our priority.  As part of our continuing mission to provide you with exceptional heart care, we have created designated Provider Care Teams.  These Care Teams include your primary Cardiologist (physician) and Advanced Practice Providers (APPs -  Physician Assistants and Nurse Practitioners) who all work together to provide you with the care you need, when you need it.  We recommend signing up for the patient portal called "MyChart".  Sign up information is provided on this After Visit Summary.  MyChart is used to connect with patients for Virtual Visits (Telemedicine).  Patients are able to view lab/test results, encounter notes, upcoming appointments, etc.  Non-urgent messages can be sent to your provider as well.   To learn more about what you can do with MyChart, go to [email or web address].    Your next appointment:    Monday 05/12/23 at 03:30  Provider:   [name], MD

## 2023-03-06 ENCOUNTER — Other Ambulatory Visit: Payer: Self-pay | Admitting: Cardiovascular Disease

## 2023-03-06 DIAGNOSIS — I272 Pulmonary hypertension, unspecified: Secondary | ICD-10-CM

## 2023-03-08 ENCOUNTER — Encounter: Payer: Self-pay | Admitting: Cardiovascular Disease

## 2023-04-02 ENCOUNTER — Other Ambulatory Visit: Payer: Self-pay | Admitting: Family Medicine

## 2023-04-02 ENCOUNTER — Other Ambulatory Visit: Payer: Self-pay | Admitting: Cardiovascular Disease

## 2023-04-02 DIAGNOSIS — I272 Pulmonary hypertension, unspecified: Secondary | ICD-10-CM

## 2023-04-14 ENCOUNTER — Other Ambulatory Visit: Payer: Self-pay | Admitting: Cardiovascular Disease

## 2023-04-15 ENCOUNTER — Encounter: Payer: Self-pay | Admitting: Sports Medicine

## 2023-04-15 ENCOUNTER — Ambulatory Visit (INDEPENDENT_AMBULATORY_CARE_PROVIDER_SITE_OTHER): Payer: Medicare Other | Admitting: Sports Medicine

## 2023-04-15 VITALS — BP 110/60 | HR 80 | Temp 96.4°F | Resp 16 | Ht 59.0 in | Wt 120.4 lb

## 2023-04-15 DIAGNOSIS — Z23 Encounter for immunization: Secondary | ICD-10-CM

## 2023-04-15 DIAGNOSIS — R339 Retention of urine, unspecified: Secondary | ICD-10-CM | POA: Diagnosis not present

## 2023-04-15 DIAGNOSIS — R252 Cramp and spasm: Secondary | ICD-10-CM

## 2023-04-15 DIAGNOSIS — R1030 Lower abdominal pain, unspecified: Secondary | ICD-10-CM

## 2023-04-15 DIAGNOSIS — I5022 Chronic systolic (congestive) heart failure: Secondary | ICD-10-CM | POA: Diagnosis not present

## 2023-04-15 DIAGNOSIS — I480 Paroxysmal atrial fibrillation: Secondary | ICD-10-CM

## 2023-04-15 DIAGNOSIS — R300 Dysuria: Secondary | ICD-10-CM | POA: Diagnosis not present

## 2023-04-15 DIAGNOSIS — K529 Noninfective gastroenteritis and colitis, unspecified: Secondary | ICD-10-CM

## 2023-04-15 LAB — POCT URINALYSIS DIPSTICK
Bilirubin, UA: NEGATIVE
Glucose, UA: NEGATIVE
Ketones, UA: POSITIVE — AB
Nitrite, UA: NEGATIVE
Protein, UA: POSITIVE — AB
Spec Grav, UA: <=1.005 — AB (ref 1.010–1.025)
Urobilinogen, UA: NEGATIVE U/dL — AB
pH, UA: 6.5 (ref 5.0–8.0)

## 2023-04-15 MED ORDER — AMOXICILLIN-POT CLAVULANATE 875-125 MG PO TABS: 1.0000 | ORAL_TABLET | Freq: Two times a day (BID) | ORAL | 0 refills | Status: DC

## 2023-04-15 NOTE — Progress Notes (Unsigned)
Careteam: Patient Care Team: [name], MD as PCP - General (Internal Medicine) Croitoru, Rachelle Hora, MD as PCP - Cardiology (Cardiology) Leslye Peer, MD as Consulting Physician (Pulmonary Disease) Blima Ledger, OD (Optometry) Mitchel Honour, DO as Consulting Physician (Obstetrics and Gynecology)  PLACE OF SERVICE:  [facility]  Advanced Directive information Does Patient Have a Medical Advance Directive?: Yes, Type of Advance Directive: Healthcare Power of [place];Living will;Out of facility DNR (pink MOST or yellow form), Does patient want to make changes to medical advance directive?: No - Patient declined  Allergies  Allergen Reactions   Codeine Nausea And Vomiting and Nausea Only   Demerol Hcl [Meperidine] Nausea Only   Ketorolac Nausea And Vomiting, Nausea Only and Other (See Comments)    "pt told to never take this med"   Demerol Nausea And Vomiting   Multaq [Dronedarone] Other (See Comments)    Increased fluid retention   Flexeril [Cyclobenzaprine] Nausea And Vomiting   Xarelto [Rivaroxaban] Diarrhea   Latex Rash    Pt stated on 09/20/2022    Chief Complaint  Patient presents with   Acute Visit    Patient complains of possible UTI. Symptoms are dysuria, and lower abdominal pain Patient states this started last week.      HPI: Patient is a 77 y.o. female is here for acute visit for dysuria   Dysuria- 2 weeks Hematuria-no Urgency- yes Hesitancy- yes Lower abdominal pain- no Nausea- yes Vomiting- no Fevers- no Previous urine culture mixed bacterial flora from 06/2022  Pt states that she is not able to empty her bladder completely   Chronic diarrhea  Chronic Takes imodium  Saw GI in the past and had work up Last BM this morning  Soft stools Denies bloody or dark stools Reports good appetite Weight stable  Chronic diastolic heart failure  Has exertional SOB  Sleeps with head elevatio  On lasix  Afib  On eliquis, metoprolol  Review of  Systems:  Review of Systems  Constitutional:  Negative for chills and fever.  HENT:  Negative for congestion and sore throat.   Eyes:  Negative for double vision.  Respiratory:  Negative for cough, sputum production and shortness of breath.   Cardiovascular:  Negative for chest pain, palpitations and leg swelling.  Gastrointestinal:  Positive for diarrhea. Negative for abdominal pain, heartburn and nausea.  Genitourinary:  Positive for dysuria, frequency and urgency. Negative for hematuria.  Musculoskeletal:  Negative for falls and myalgias.  Neurological:  Negative for dizziness, sensory change and focal weakness.     Past Medical History:  Diagnosis Date   Acute on chronic diastolic CHF (congestive heart failure), NYHA class 1 (HCC) 04/29/2013   Allergic rhinitis    Brady-tachy syndrome (HCC)    s/p STJ dual chamber PPM by Dr [name]   CAD (coronary artery disease)    70% RCA stenosis, treated medically   Control of atrial fibrillation with pacemaker (HCC)    Diabetes mellitus (HCC)    type 2   DJD (degenerative joint disease)    Dyspnea    occasional   Hearing loss    Right ear only - no hearing aids   Hypertension    Hyperthyroidism    Paroxysmal atrial fibrillation (HCC)    Pacemaker [employer] Jude   Pneumonia    years ago- before 2012 per patient   Polio    as a child   PONV (postoperative nausea and vomiting)    Presence of permanent cardiac pacemaker  St Jude pacemaker   Pulmonary disease    Sleep apnea    uses  CPAP nightly   Past Surgical History:  Procedure Laterality Date   arm surgery d/t fx Right 1996   ATRIAL FIBRILLATION ABLATION  07/31/2012; 08-10-13   PVI by Dr [name]   ATRIAL FIBRILLATION ABLATION N/A 07/31/2012   Procedure: ATRIAL FIBRILLATION ABLATION;  Surgeon: Hillis Range, MD;  Location: [facility] CATH LAB;  Service: Cardiovascular;  Laterality: N/A;   ATRIAL FIBRILLATION ABLATION N/A 08/10/2013   Procedure: ATRIAL FIBRILLATION ABLATION;  Surgeon: Gardiner Rhyme, MD;  Location: [facility] CATH LAB;  Service: Cardiovascular;  Laterality: N/A;   BACK SURGERY  07/26/2012   dR. [facility]   CARDIAC CATHETERIZATION  12/03/2010   single vessel mid RCA   CARDIAC ELECTROPHYSIOLOGY MAPPING AND ABLATION  07/31/2012   Dr. Hillis Range   CESAREAN SECTION  09/20/1974   Dr. Greta Doom   CHOLECYSTECTOMY  1994   COLONOSCOPY WITH PROPOFOL N/A 03/21/2015   Procedure: COLONOSCOPY WITH PROPOFOL;  Surgeon: Charna Elizabeth, MD;  Location: [facility] ENDOSCOPY;  Service: Endoscopy;  Laterality: N/A;   DIAGNOSTIC MAMMOGRAM  04/16/2017   Tylene Fantasia. Morris, DO   EYE SURGERY Bilateral    remove cataracts   LAPAROSCOPIC HYSTERECTOMY  Late 63s to early 48s   LEFT HEART CATH AND CORONARY ANGIOGRAPHY N/A 07/30/2017   Procedure: LEFT HEART CATH AND CORONARY ANGIOGRAPHY;  Surgeon: Lennette Bihari, MD;  Location: [facility] INVASIVE CV LAB;  Service: Cardiovascular;  Laterality: N/A;   LUMBAR LAMINECTOMY/DECOMPRESSION MICRODISCECTOMY Right 12/23/2012   Procedure: Right L5-S1 Laminotomy for resection of synovial cyst;  Surgeon: [name], MD;  Location: [facility] NEURO ORS;  Service: Neurosurgery;  Laterality: Right;  Right Lumbar five-sacral one Laminotomy for resection of synovial cyst   NM MYOCAR PERF WALL MOTION  12/20/2010   normal   PACEMAKER IMPLANT N/A 08/09/2019   Procedure: PACEMAKER IMPLANT;  Surgeon: [name], MD;  Location: [facility] INVASIVE CV LAB;  Service: Cardiovascular;  Laterality: N/A;   PACEMAKER INSERTION  12/03/2010   SJM Accent DR RF implanted by Dr [name]   RIGHT HEART CATH N/A 04/22/2018   Procedure: RIGHT HEART CATH;  Surgeon: Iran Ouch, MD;  Location: [facility] INVASIVE CV LAB;  Service: Cardiovascular;  Laterality: N/A;   RIGHT HEART CATH N/A 07/09/2022   Procedure: RIGHT HEART CATH;  Surgeon: Dolores Patty, MD;  Location: [facility] INVASIVE CV LAB;  Service: Cardiovascular;  Laterality: N/A;   TEE WITHOUT CARDIOVERSION N/A 07/30/2012   Procedure: TRANSESOPHAGEAL  ECHOCARDIOGRAM (TEE);  Surgeon: [name], MD;  Location: [facility] ENDOSCOPY;  Service: Cardiovascular;  Laterality: N/A;  h&p in file-hope   TEE WITHOUT CARDIOVERSION N/A 08/09/2013   Procedure: TRANSESOPHAGEAL ECHOCARDIOGRAM (TEE);  Surgeon: Lewayne Bunting, MD;  Location: [facility] ENDOSCOPY;  Service: Cardiovascular;  Laterality: N/A;   THYROID LOBECTOMY Right 09/20/2022   Procedure: RIGHT HEMITHYROIDECTOMY;  Surgeon: Laren Boom, DO;  Location: [facility] OR;  Service: ENT;  Laterality: Right;   TOTAL ABDOMINAL HYSTERECTOMY  1981   ULTRASOUND GUIDANCE FOR VASCULAR ACCESS  04/22/2018   Procedure: Ultrasound Guidance For Vascular Access;  Surgeon: Iran Ouch, MD;  Location: [facility] INVASIVE CV LAB;  Service: Cardiovascular;;   Social History:   reports that she has never smoked. She has never used smokeless tobacco. She reports that she does not drink alcohol and does not use drugs.  Family History  Problem Relation Age of Onset   Cancer Brother    Heart attack Sister  Breast cancer Sister    Diabetes Sister    Heart disease Sister    Breast cancer Sister    Dementia Sister    Colon cancer Sister    Heart disease Sister    Breast cancer Sister    Suicidality Brother    Prostate cancer Brother    Dementia Brother     Medications: Patient's Medications  New Prescriptions   No medications on file  Previous Medications   ACETAMINOPHEN (TYLENOL) 500 MG TABLET    Take 500-1,000 mg by mouth every 6 (six) hours as needed (pain.).   CHOLECALCIFEROL (VITAMIN D3) 1000 UNITS CAPS    Take 1,000 Units by mouth in the morning.   ELIQUIS 5 MG TABS TABLET    Take 5 mg by mouth 2 (two) times daily.   FUROSEMIDE (LASIX) 80 MG TABLET    Take 1 tablet (80 mg total) by mouth every morning.   GLUCOSE BLOOD (ONETOUCH ULTRA) TEST STRIP    USE DAILY TO CHECK BLOOD SUGAR E11.8   LANCETS MISC. (ONE TOUCH SURESOFT) MISC    Use to test  Blood sugar once daily DX: E11.8,   LEVOTHYROXINE (SYNTHROID) 25 MCG  TABLET    Take 25 mcg by mouth daily before breakfast.   MAGNESIUM OXIDE (MAG-OX) 400 (240 MG) MG TABLET    Take 400 mg by mouth daily.   METHIMAZOLE (TAPAZOLE) 10 MG TABLET    TAKE ONE TABLET BY MOUTH EVERY DAY WITH FOOD   METOPROLOL TARTRATE (LOPRESSOR) 50 MG TABLET    TAKE ONE TABLET BY MOUTH EVERY MORNING AND 1/2 TABLET EVERY EVENING   ONDANSETRON (ZOFRAN) 4 MG TABLET    Take 1 tablet (4 mg total) by mouth every 8 (eight) hours as needed for nausea or vomiting.   OXYBUTYNIN (DITROPAN) 5 MG TABLET    TAKE ONE TABLET BY MOUTH EVERY DAY   PANTOPRAZOLE (PROTONIX) 40 MG TABLET    TAKE ONE TABLET BY MOUTH EVERY DAY   POTASSIUM CHLORIDE SA (KLOR-CON M) 20 MEQ TABLET    TAKE ONE TABLET BY MOUTH TWICE DAILY   SILDENAFIL (REVATIO) 20 MG TABLET    TAKE ONE TABLET BY MOUTH 3 TIMES DAILY   SPIRONOLACTONE (ALDACTONE) 25 MG TABLET    Take 1 tablet (25 mg total) by mouth daily.  Modified Medications   No medications on file  Discontinued Medications   CRANBERRY 500 MG CAPS    Take 500 mg by mouth daily as needed (Yeast infection).   FLUCONAZOLE (DIFLUCAN) 150 MG TABLET    Take 1 tablet (150 mg total) by mouth daily as needed. Use one tablet as needed throughout the year for flare ups   LOPERAMIDE (IMODIUM) 2 MG CAPSULE    Take 2 mg by mouth daily as needed for diarrhea or loose stools.    Physical Exam:  Vitals:   04/15/23 1353  BP: 110/60  Pulse: 80  Resp: 16  Temp: (!) 96.4 F (35.8 C)  SpO2: 90%  Weight: 120 lb 6.4 oz (54.6 kg)  Height: 4\' 11"  (1.499 m)   Body mass index is 24.32 kg/m. Wt Readings from Last 3 Encounters:  04/15/23 120 lb 6.4 oz (54.6 kg)  03/04/23 118 lb 6.4 oz (53.7 kg)  02/26/23 119 lb 12.8 oz (54.3 kg)    Physical Exam Constitutional:      Appearance: Normal appearance.  Cardiovascular:     Rate and Rhythm: Normal rate and regular rhythm.  Pulmonary:     Effort: Pulmonary  effort is normal. No respiratory distress.     Breath sounds: Normal breath sounds. No  wheezing.  Abdominal:     General: Bowel sounds are normal. There is no distension.     Tenderness: There is abdominal tenderness (lower belly). There is no guarding or rebound.  Musculoskeletal:        General: No swelling or tenderness.  Skin:    General: Skin is dry.  Neurological:     Mental Status: She is alert. Mental status is at baseline.     Sensory: No sensory deficit.     Motor: No weakness.     Labs reviewed: Basic Metabolic Panel: Recent Labs    08/01/22 1434 09/16/22 1431 11/01/22 1233 11/19/22 1418 01/13/23 1300  NA 136 133* 134 135  --   K 4.5 4.5 4.7 4.3  --   CL 98 96* 94* 95*  --   CO2 22 28 20 22   --   GLUCOSE 139* 107* 113* 94  --   BUN 15 19 18 20   --   CREATININE 1.37* 1.26* 1.34* 1.41* 1.40*  CALCIUM 9.5 9.1 9.1 9.0  --   MG 2.3  --  2.4* 2.5*  --    Liver Function Tests: Recent Labs    08/01/22 1434  AST 17  ALT 8  ALKPHOS 123*  BILITOT 1.2  PROT 6.7  ALBUMIN 4.4   No results for input(s): "LIPASE", "AMYLASE" in the last 8760 hours. No results for input(s): "AMMONIA" in the last 8760 hours. CBC: Recent Labs    07/09/22 0804 07/09/22 0923 08/01/22 1434 09/16/22 1431  WBC 5.9  --  7.9 8.8  HGB 11.8* 12.2  11.9* 12.2 12.9  HCT 37.7 36.0  35.0* 38.5 41.0  MCV 90.0  --  87 86.0  PLT 236  --  279 282   Lipid Panel: No results for input(s): "CHOL", "HDL", "LDLCALC", "TRIG", "CHOLHDL", "LDLDIRECT" in the last 8760 hours. TSH: No results for input(s): "TSH" in the last 8760 hours. A1C: Lab Results  Component Value Date   HGBA1C 6.3 (H) 09/16/2022     Assessment/Plan  1. Dysuria  UA pos Will send augmentin  Sent urine for culture - Culture, Urine - amoxicillin-clavulanate (AUGMENTIN) 875-125 MG tablet; Take 1 tablet by mouth 2 (two) times daily.  Dispense: 20 tablet; Refill: 0  2. Lower abdominal pain Mild lowe abdominal pain - Culture, Urine - CBC With Differential/Platelet - Basic Metabolic Panel with eGFR - CT  ABDOMEN PELVIS W CONTRAST; Future  3. Chronic diarrhea  Will monitor  Cont with imodium  4. Paroxysmal atrial fibrillation (HCC) Rate controlled No signs of bleeding Cont with eliquis  5. Chronic systolic CHF (congestive heart failure) (HCC) Euvolemic on exam   6. Leg cramps Will check magnesium levels - Magnesium  7. Urinary retention Will refer to urology for bladder scan - Ambulatory referral to Urology  8. Need for influenza vaccination   - Flu Vaccine Trivalent High Dose (Fluad)   No follow-ups on file.:  2 months  I spent greater than  40  minutes for the care of this patient in face to face time, chart review, clinical documentation, patient education.

## 2023-04-16 ENCOUNTER — Ambulatory Visit (HOSPITAL_COMMUNITY)
Admission: RE | Admit: 2023-04-16 | Discharge: 2023-04-16 | Disposition: A | Discharge status: Home / Self Care | Payer: Medicare Other | Source: Ambulatory Visit | Attending: Sports Medicine | Admitting: Sports Medicine

## 2023-04-16 ENCOUNTER — Other Ambulatory Visit (HOSPITAL_COMMUNITY): Payer: Medicare Other

## 2023-04-16 ENCOUNTER — Encounter: Payer: Self-pay | Admitting: Sports Medicine

## 2023-04-16 DIAGNOSIS — K7689 Other specified diseases of liver: Secondary | ICD-10-CM | POA: Diagnosis not present

## 2023-04-16 DIAGNOSIS — R1030 Lower abdominal pain, unspecified: Secondary | ICD-10-CM | POA: Insufficient documentation

## 2023-04-16 DIAGNOSIS — N200 Calculus of kidney: Secondary | ICD-10-CM | POA: Diagnosis not present

## 2023-04-16 DIAGNOSIS — K5730 Diverticulosis of large intestine without perforation or abscess without bleeding: Secondary | ICD-10-CM | POA: Diagnosis not present

## 2023-04-16 DIAGNOSIS — K8689 Other specified diseases of pancreas: Secondary | ICD-10-CM | POA: Diagnosis not present

## 2023-04-16 MED ORDER — IOPAMIDOL (ISOVUE-370) INJECTION 76%
75.0000 mL | Freq: Once | INTRAVENOUS | Status: AC | PRN
  Administered 2023-04-16: 75 mL via INTRAVENOUS

## 2023-04-17 ENCOUNTER — Ambulatory Visit (HOSPITAL_COMMUNITY): Payer: Medicare Other

## 2023-04-17 LAB — URINE CULTURE
MICRO NUMBER:: 15719602
SPECIMEN QUALITY:: ADEQUATE

## 2023-04-18 ENCOUNTER — Other Ambulatory Visit: Payer: Self-pay | Admitting: Sports Medicine

## 2023-04-18 LAB — CBC WITH DIFFERENTIAL/PLATELET
Absolute Lymphocytes: 1414 {cells}/uL (ref 850–3900)
Absolute Monocytes: 490 {cells}/uL (ref 200–950)
Basophils Absolute: 49 {cells}/uL (ref 0–200)
Basophils Relative: 0.7 %
Eosinophils Absolute: 98 {cells}/uL (ref 15–500)
Eosinophils Relative: 1.4 %
HCT: 38.6 % (ref 35.0–45.0)
Hemoglobin: 11.2 g/dL — ABNORMAL LOW (ref 11.7–15.5)
MCH: 22.9 pg — ABNORMAL LOW (ref 27.0–33.0)
MCHC: 29 g/dL — ABNORMAL LOW (ref 32.0–36.0)
MCV: 78.9 fL — ABNORMAL LOW (ref 80.0–100.0)
MPV: 11.2 fL (ref 7.5–12.5)
Monocytes Relative: 7 %
Neutro Abs: 4949 {cells}/uL (ref 1500–7800)
Neutrophils Relative %: 70.7 %
Platelets: 350 10*3/uL (ref 140–400)
RBC: 4.89 10*6/uL (ref 3.80–5.10)
RDW: 15.6 % — ABNORMAL HIGH (ref 11.0–15.0)
Total Lymphocyte: 20.2 %
WBC: 7 10*3/uL (ref 3.8–10.8)

## 2023-04-18 LAB — BASIC METABOLIC PANEL WITH GFR
BUN/Creatinine Ratio: 14 (calc) (ref 6–22)
BUN: 15 mg/dL (ref 7–25)
CO2: 23 mmol/L (ref 20–32)
Calcium: 9.5 mg/dL (ref 8.6–10.4)
Chloride: 96 mmol/L — ABNORMAL LOW (ref 98–110)
Creat: 1.08 mg/dL — ABNORMAL HIGH (ref 0.60–1.00)
Glucose, Bld: 112 mg/dL — ABNORMAL HIGH (ref 65–99)
Potassium: 4 mmol/L (ref 3.5–5.3)
Sodium: 135 mmol/L (ref 135–146)
eGFR: 53 mL/min/{1.73_m2} — ABNORMAL LOW (ref >=60)

## 2023-04-18 LAB — TEST AUTHORIZATION

## 2023-04-18 LAB — IRON,TIBC AND FERRITIN PANEL
%SAT: 5 % — ABNORMAL LOW (ref 16–45)
Ferritin: 11 ng/mL — ABNORMAL LOW (ref 16–288)
Iron: 24 ug/dL — ABNORMAL LOW (ref 45–160)
TIBC: 484 ug/dL — ABNORMAL HIGH (ref 250–450)

## 2023-04-18 LAB — MAGNESIUM: Magnesium: 2.2 mg/dL (ref 1.5–2.5)

## 2023-04-18 MED ORDER — FERROUS GLUCONATE 324 (38 FE) MG PO TABS: 324.0000 mg | ORAL_TABLET | ORAL | 3 refills | Status: DC

## 2023-04-29 ENCOUNTER — Ambulatory Visit: Payer: Medicare Other | Admitting: Primary Care

## 2023-04-29 NOTE — Progress Notes (Unsigned)
Name: Hailey [name] DOB: 20-Mar-1946 MRN: [id number]  History of Present Illness: [name] is a 77 y.o. female who presents today as a new patient at [facility]. All available relevant medical records have been reviewed. She is accompanied by her daughter Hailey [name].  Recent history: > 04/15/2023:  - Seen by PCP for LUTS x2 weeks including dysuria, urgency, hesitancy, and "states that she is not able to empty her bladder completely". She states she was also impacted with stool at that time. - No leukocytosis (WBC 7.0). - Creatinine 1.08, GFR 53. - UA: trace leukocytes and moderate blood; no nitrites.  - Urine culture came back negative. - Treated empirically with Augmentin.  - Referred to urology for bladder scan.  > 04/16/2023: - CT abdomen/pelvis w/ contrast showed no acute GU findings. Mild bilateral renal atrophy. 3 mm nonobstructing right renal stone. No ureteral stones, masses or hydronephrosis.   Urine culture results in past 12 months: - 06/13/2022: Negative - 04/15/2023: Negative  Today: She reports chief complaint of urinary hesitancy and sensations of incomplete emptying. It was worse when she was impacted but she states that chronically "if I don't take my fluid pills I can't go at all - I would have to be catheterized). Denies straining to void - states she works on breathing to relax to urinate.  States she took some of the Augmentin but didn't complete the whole antibiotic course. Denies concern for UTI today - denies dysuria.  She reports urinary frequency and nocturia "for years" which she relates to her diuretic use. Reports mild caffeine intake ("I drink decaf").  She reports one prior history of gross hematuria >3 years ago.  She denies history of kidney stones.  She denies history of recent or recurrent UTI. She denies history of GU malignancy or pelvic radiation.  She denies history of smoking. She reports taking anticoagulants  (Eliquis).   Fall Screening: Do you usually have a device to assist in your mobility? No   Medications: Current Outpatient Medications  Medication Sig Dispense Refill   acetaminophen (TYLENOL) 500 MG tablet Take 500-1,000 mg by mouth every 6 (six) hours as needed (pain.).     Cholecalciferol (VITAMIN D3) 1000 UNITS CAPS Take 1,000 Units by mouth in the morning.     ELIQUIS 5 MG TABS tablet Take 5 mg by mouth 2 (two) times daily.     ferrous gluconate (FERGON) 324 MG tablet Take 1 tablet (324 mg total) by mouth every other day. 90 tablet 3   ferrous sulfate 324 MG TBEC Take 324 mg by mouth.     glucose blood (ONETOUCH ULTRA) test strip USE DAILY TO CHECK BLOOD SUGAR E11.8 100 each 11   Lancets Misc. (ONE TOUCH SURESOFT) MISC Use to test  Blood sugar once daily DX: E11.8, 1 each 0   levothyroxine (SYNTHROID) 25 MCG tablet Take 25 mcg by mouth daily before breakfast.     magnesium oxide (MAG-OX) 400 (240 Mg) MG tablet Take 400 mg by mouth daily.     methimazole (TAPAZOLE) 10 MG tablet TAKE ONE TABLET BY MOUTH EVERY DAY WITH FOOD 90 tablet 1   metoprolol tartrate (LOPRESSOR) 50 MG tablet TAKE ONE TABLET BY MOUTH EVERY MORNING AND 1/2 TABLET EVERY EVENING 135 tablet 3   ondansetron (ZOFRAN) 4 MG tablet Take 1 tablet (4 mg total) by mouth every 8 (eight) hours as needed for nausea or vomiting. 20 tablet 0   pantoprazole (PROTONIX) 40 MG tablet TAKE ONE TABLET  BY MOUTH EVERY DAY 90 tablet 1   potassium chloride SA (KLOR-CON M) 20 MEQ tablet TAKE ONE TABLET BY MOUTH TWICE DAILY 180 tablet 2   sildenafil (REVATIO) 20 MG tablet TAKE ONE TABLET BY MOUTH 3 TIMES DAILY 90 tablet 6   spironolactone (ALDACTONE) 25 MG tablet Take 1 tablet (25 mg total) by mouth daily. 90 tablet 3   furosemide (LASIX) 80 MG tablet Take 1 tablet (80 mg total) by mouth every morning. (Patient not taking: Reported on 04/30/2023) 90 tablet 3   No current facility-administered medications for this visit.     Allergies: Allergies  Allergen Reactions   Codeine Nausea And Vomiting and Nausea Only   Demerol Hcl [Meperidine] Nausea Only   Ketorolac Nausea And Vomiting, Nausea Only and Other (See Comments)    "pt told to never take this med"   Demerol Nausea And Vomiting   Multaq [Dronedarone] Other (See Comments)    Increased fluid retention   Flexeril [Cyclobenzaprine] Nausea And Vomiting   Xarelto [Rivaroxaban] Diarrhea   Latex Rash    Pt stated on 09/20/2022    Past Medical History:  Diagnosis Date   Acute on chronic diastolic CHF (congestive heart failure), NYHA class 1 (HCC) 04/29/2013   Allergic rhinitis    Brady-tachy syndrome (HCC)    s/p STJ dual chamber PPM by Dr [name]   CAD (coronary artery disease)    70% RCA stenosis, treated medically   Control of atrial fibrillation with pacemaker (HCC)    Diabetes mellitus (HCC)    type 2   DJD (degenerative joint disease)    Dyspnea    occasional   Hearing loss    Right ear only - no hearing aids   Hypertension    Hyperthyroidism    Paroxysmal atrial fibrillation (HCC)    Pacemaker St Jude   Pneumonia    years ago- before 2012 per patient   Polio    as a child   PONV (postoperative nausea and vomiting)    Presence of permanent cardiac pacemaker    St Jude pacemaker   Pulmonary disease    Sleep apnea    uses  CPAP nightly   Past Surgical History:  Procedure Laterality Date   arm surgery d/t fx Right 1996   ATRIAL FIBRILLATION ABLATION  07/31/2012; 08-10-13   PVI by Dr [name]   ATRIAL FIBRILLATION ABLATION N/A 07/31/2012   Procedure: ATRIAL FIBRILLATION ABLATION;  Surgeon: Hillis Range, MD;  [facility] CATH LAB;  Service: Cardiovascular;  Laterality: N/A;   ATRIAL FIBRILLATION ABLATION N/A 08/10/2013   Procedure: ATRIAL FIBRILLATION ABLATION;  Surgeon: Gardiner Rhyme, MD;  Location: [facility] CATH LAB;  Service: Cardiovascular;  Laterality: N/A;   BACK SURGERY  07/26/2012   dR. [facility]   CARDIAC CATHETERIZATION   12/03/2010   single vessel mid RCA   CARDIAC ELECTROPHYSIOLOGY MAPPING AND ABLATION  07/31/2012   Dr. Hillis Range   CESAREAN SECTION  09/20/1974   Dr. Greta Doom   CHOLECYSTECTOMY  1994   COLONOSCOPY WITH PROPOFOL N/A 03/21/2015   Procedure: COLONOSCOPY WITH PROPOFOL;  Surgeon: Charna Elizabeth, MD;  Location: [facility] ENDOSCOPY;  Service: Endoscopy;  Laterality: N/A;   DIAGNOSTIC MAMMOGRAM  04/16/2017   Tylene Fantasia. Morris, DO   EYE SURGERY Bilateral    remove cataracts   LAPAROSCOPIC HYSTERECTOMY  Late 25s to early 82s   LEFT HEART CATH AND CORONARY ANGIOGRAPHY N/A 07/30/2017   Procedure: LEFT HEART CATH AND CORONARY ANGIOGRAPHY;  Surgeon: Lennette Bihari, MD;  Location: [facility] INVASIVE CV LAB;  Service: Cardiovascular;  Laterality: N/A;   LUMBAR LAMINECTOMY/DECOMPRESSION MICRODISCECTOMY Right 12/23/2012   Procedure: Right L5-S1 Laminotomy for resection of synovial cyst;  Surgeon: [name], MD;  Location: [facility] NEURO ORS;  Service: Neurosurgery;  Laterality: Right;  Right Lumbar five-sacral one Laminotomy for resection of synovial cyst   NM MYOCAR PERF WALL MOTION  12/20/2010   normal   PACEMAKER IMPLANT N/A 08/09/2019   Procedure: PACEMAKER IMPLANT;  Surgeon: [name], MD;  Location: [facility] INVASIVE CV LAB;  Service: Cardiovascular;  Laterality: N/A;   PACEMAKER INSERTION  12/03/2010   SJM Accent DR RF implanted by Dr [name]   RIGHT HEART CATH N/A 04/22/2018   Procedure: RIGHT HEART CATH;  Surgeon: Iran Ouch, MD;  [facility] INVASIVE CV LAB;  Service: Cardiovascular;  Laterality: N/A;   RIGHT HEART CATH N/A 07/09/2022   Procedure: RIGHT HEART CATH;  Surgeon: Dolores Patty, MD;  Location: [facility] INVASIVE CV LAB;  Service: Cardiovascular;  Laterality: N/A;   TEE WITHOUT CARDIOVERSION N/A 07/30/2012   Procedure: TRANSESOPHAGEAL ECHOCARDIOGRAM (TEE);  Surgeon: [name], MD;  Location: [facility] ENDOSCOPY;  Service: Cardiovascular;  Laterality: N/A;  h&p in file-hope   TEE WITHOUT  CARDIOVERSION N/A 08/09/2013   Procedure: TRANSESOPHAGEAL ECHOCARDIOGRAM (TEE);  Surgeon: Lewayne Bunting, MD;  Location: [facility] ENDOSCOPY;  Service: Cardiovascular;  Laterality: N/A;   THYROID LOBECTOMY Right 09/20/2022   Procedure: RIGHT HEMITHYROIDECTOMY;  Surgeon: Laren Boom, DO;  Location: [facility] OR;  Service: ENT;  Laterality: Right;   TOTAL ABDOMINAL HYSTERECTOMY  1981   ULTRASOUND GUIDANCE FOR VASCULAR ACCESS  04/22/2018   Procedure: Ultrasound Guidance For Vascular Access;  Surgeon: Iran Ouch, MD;  Location: [facility] INVASIVE CV LAB;  Service: Cardiovascular;;   Family History  Problem Relation Age of Onset   Cancer Brother    Heart attack Sister    Breast cancer Sister    Diabetes Sister    Heart disease Sister    Breast cancer Sister    Dementia Sister    Colon cancer Sister    Heart disease Sister    Breast cancer Sister    Suicidality Brother    Prostate cancer Brother    Dementia Brother    Social History   Socioeconomic History   Marital status: Married    Spouse name: Loistine Chance   Number of children: 3   Years of education: 13   Highest education level: Some college, no degree  Occupational History   Occupation: housewife  Tobacco Use   Smoking status: Never   Smokeless tobacco: Never  Vaping Use   Vaping status: Never Used  Substance and Sexual Activity   Alcohol use: No   Drug use: No   Sexual activity: Not Currently    Birth control/protection: Abstinence, Surgical    Comment: Hysterectomy  Other Topics Concern   Not on file  Social History Narrative   Lives in [place].  Retired.  Was office clerk in several companies.  Did some technical school after completing high school.  She has no pets.  She and her husband live in a one-story home.  They've been married since 1965.  She follows a heart healthy diet.  She has a living will and hcpoa but does not have a DNR form.  She does struggle to prepare food.     Social Determinants of Health    Financial Resource Strain: Not on file  Food Insecurity: Low Risk  (11/11/2022)  [facility]   Hunger Vital Sign    Worried About Running Out of Food in the Last Year: Never true    Ran Out of Food in the Last Year: Never true  Transportation Needs: No Transportation Needs (09/20/2022)   PRAPARE - Administrator, Civil Service (Medical): No    Lack of Transportation (Non-Medical): No  Physical Activity: Not on file  Stress: Not on file  Social Connections: Not on file  Intimate Partner Violence: Not At Risk (09/20/2022)   Humiliation, Afraid, Rape, and Kick questionnaire    Fear of Current or Ex-Partner: No    Emotionally Abused: No    Physically Abused: No    Sexually Abused: No    SUBJECTIVE  Review of Systems Constitutional: Patient denies any unintentional weight loss or change in strength lntegumentary: Patient denies any rashes or pruritus Cardiovascular: Patient denies chest pain or syncope Respiratory: Patient denies shortness of breath Gastrointestinal: As per HPI  Musculoskeletal: Patient denies muscle cramps or weakness Neurologic: Patient denies convulsions or seizures Allergic/Immunologic: Patient denies recent allergic reaction(s) Hematologic/Lymphatic: Patient denies bleeding tendencies Endocrine: Patient denies heat/cold intolerance  GU: As per HPI.  OBJECTIVE Vitals:   04/30/23 1039  BP: 126/73  Pulse: 80  Temp: 98.2 F (36.8 C)   There is no height or weight on file to calculate BMI.  Physical Examination Constitutional: No obvious distress; patient is non-toxic appearing  Cardiovascular: No visible lower extremity edema.  Respiratory: The patient does not have audible wheezing/stridor; respirations do not appear labored  Gastrointestinal: Abdomen non-distended Musculoskeletal: Normal ROM of UEs  Skin: No obvious rashes/open sores  Neurologic: CN 2-12 grossly intact Psychiatric: Answered questions  appropriately with normal affect  Hematologic/Lymphatic/Immunologic: No obvious bruises or sites of spontaneous bleeding  UA: 3-10 RBC/hpf with no evidence of UTI PVR: 93 ml  ASSESSMENT Feeling of incomplete bladder emptying - Plan: Urinalysis, Routine w reflex microscopic, BLADDER SCAN AMB NON-IMAGING  Microscopic hematuria  Lower urinary tract symptoms (LUTS)  We discussed that there is no evidence of significant incomplete bladder emptying based on today's PVR; suspect that her voiding dysfunction (hesitancy and sensations of incomplete bladder emptying) may be related to pelvic floor muscle dysfunction and/or constipation (as evidenced by recent fecal impaction) which can both create pressure / constriction on the urethra anteriorly. Advised to continue breath work and relaxation to optimize bladder emptying. Discussed option to consider pelvic floor physical therapy in the future if needed.  Advised to discontinue Oxybutynin 5 mg daily due to likely contribution to her voiding dysfunction and recent fecal impaction. We discussed other potential side effects of anticholinergic medications including dry eyes, dry mouth, constipation, cognitive impairment, dementia risk with long term use.  Also advised to discard remaining Augmentin since urine culture was negative (no UTI) on 04/15/2023 and there is no evidence of UTI per today's urine results.  For microscopic hematuria we discussed possible etiologies including but not limited to: stone, blood thinner use, infection, urethral irritation secondary to vaginal atrophy, chronic kidney disease, glomerulonephropathy, malignancy. No acute GU findings on recent CT abdomen/pelvis w/ contrast. Suspect vaginal atrophy + anticoagulant use as most likely etiology, however cystoscopy advised for further evaluation.  Advised to follow up with PCP and/or GI for her bowel concerns.   She is already established with cardiology for her fluid retention  concerns. We reviewed the rationale for diuretic use (Lasix) to protect her heart by off loading fluid via increased urine production by  the kidneys, which will inherently increase urinary frequency due to increased urine volume.   Pt verbalized understanding and agreement. All questions were answered.   PLAN Advised the following: 1. Discontinue Augmentin (does not have a UTI). 2. Discontinue Oxybutynin 5 mg daily.  3. Minimize caffeine intake. 4. Return for 1st available cystoscopy with any urology MD.  Orders Placed This Encounter  Procedures   Urinalysis, Routine w reflex microscopic   BLADDER SCAN AMB NON-IMAGING    It has been explained that the patient is to follow regularly with their PCP in addition to all other providers involved in their care and to follow instructions provided by these respective offices. Patient advised to contact urology clinic if any urologic-pertaining questions, concerns, new symptoms or problems arise in the interim period.  There are no Patient Instructions on file for this visit.  Electronically signed by:  Donnita Falls, MSN, FNP-C, CUNP 04/30/2023 12:13 PM

## 2023-04-30 ENCOUNTER — Encounter: Payer: Self-pay | Admitting: Urology

## 2023-04-30 ENCOUNTER — Ambulatory Visit: Payer: Medicare Other | Admitting: Urology

## 2023-04-30 VITALS — BP 126/73 | HR 80 | Temp 98.2°F

## 2023-04-30 DIAGNOSIS — R3914 Feeling of incomplete bladder emptying: Secondary | ICD-10-CM | POA: Diagnosis not present

## 2023-04-30 DIAGNOSIS — R3129 Other microscopic hematuria: Secondary | ICD-10-CM | POA: Diagnosis not present

## 2023-04-30 DIAGNOSIS — R399 Unspecified symptoms and signs involving the genitourinary system: Secondary | ICD-10-CM | POA: Diagnosis not present

## 2023-04-30 LAB — URINALYSIS, ROUTINE W REFLEX MICROSCOPIC
Bilirubin, UA: NEGATIVE
Glucose, UA: NEGATIVE
Ketones, UA: NEGATIVE
Nitrite, UA: NEGATIVE
Protein,UA: NEGATIVE
Specific Gravity, UA: 1.01 (ref 1.005–1.030)
Urobilinogen, Ur: 1 mg/dL (ref 0.2–1.0)
pH, UA: 6 (ref 5.0–7.5)

## 2023-04-30 LAB — MICROSCOPIC EXAMINATION: Bacteria, UA: NONE SEEN

## 2023-04-30 LAB — BLADDER SCAN AMB NON-IMAGING: Scan Result: 93

## 2023-04-30 NOTE — Progress Notes (Signed)
post void residual=93

## 2023-05-07 ENCOUNTER — Ambulatory Visit (INDEPENDENT_AMBULATORY_CARE_PROVIDER_SITE_OTHER): Payer: Medicare Other

## 2023-05-07 DIAGNOSIS — I495 Sick sinus syndrome: Secondary | ICD-10-CM | POA: Diagnosis not present

## 2023-05-07 LAB — CUP PACEART REMOTE DEVICE CHECK
Battery Remaining Longevity: 101 mo
Battery Remaining Percentage: 71 %
Battery Voltage: 3.01 V
Brady Statistic RV Percent Paced: 97 %
Date Time Interrogation Session: 20241204040023
Implantable Lead Connection Status: 753985
Implantable Lead Connection Status: 753985
Implantable Lead Connection Status: 753985
Implantable Lead Implant Date: 20120702
Implantable Lead Implant Date: 20120702
Implantable Lead Implant Date: 20210308
Implantable Lead Location: 753859
Implantable Lead Location: 753860
Implantable Lead Location: 753860
Implantable Pulse Generator Implant Date: 20210308
Lead Channel Impedance Value: 610 Ohm
Lead Channel Pacing Threshold Amplitude: 0.5 V
Lead Channel Pacing Threshold Pulse Width: 0.5 ms
Lead Channel Sensing Intrinsic Amplitude: 10.2 mV
Lead Channel Setting Pacing Amplitude: 0.75 V
Lead Channel Setting Pacing Pulse Width: 0.5 ms
Lead Channel Setting Sensing Sensitivity: 2 mV
Pulse Gen Model: 2272
Pulse Gen Serial Number: 3801383

## 2023-05-09 ENCOUNTER — Telehealth: Payer: Self-pay | Admitting: Pharmacy Technician

## 2023-05-09 ENCOUNTER — Telehealth: Payer: Self-pay | Admitting: Cardiovascular Disease

## 2023-05-09 ENCOUNTER — Other Ambulatory Visit (HOSPITAL_COMMUNITY): Payer: Self-pay

## 2023-05-09 NOTE — Telephone Encounter (Signed)
Pharmacy Patient Advocate Encounter   Received notification from Pt Calls Messages that prior authorization for sildenafil is required/requested.   Insurance verification completed.   The patient is insured through [facility] .   Per test claim: PA required; PA submitted to above mentioned insurance via CoverMyMeds Key/confirmation #/EOC [id number] Status is pending

## 2023-05-09 NOTE — Telephone Encounter (Signed)
  Pt c/o medication issue:  1. Name of Medication:   sildenafil (REVATIO) 20 MG tablet    2. How are you currently taking this medication (dosage and times per day)?   TAKE ONE TABLET BY MOUTH 3 TIMES DAILY    3. Are you having a reaction (difficulty breathing--STAT)? No   4. What is your medication issue? Pt said, per pharmacy she needs prior auth for this medication

## 2023-05-09 NOTE — Telephone Encounter (Signed)
Sending to our prior auth team

## 2023-05-10 NOTE — Telephone Encounter (Signed)
Pharmacy Patient Advocate Encounter  Received notification from [facility] that Prior Authorization for sildenafil has been APPROVED from 05/09/23 to 05/08/24   PA #/Case ID/Reference #: [id number]

## 2023-05-12 ENCOUNTER — Ambulatory Visit: Payer: Medicare Other | Attending: Cardiovascular Disease | Admitting: Cardiovascular Disease

## 2023-05-12 ENCOUNTER — Encounter: Payer: Self-pay | Admitting: Cardiovascular Disease

## 2023-05-12 VITALS — BP 122/62 | HR 84 | Ht 59.0 in | Wt 121.4 lb

## 2023-05-12 DIAGNOSIS — I5032 Chronic diastolic (congestive) heart failure: Secondary | ICD-10-CM | POA: Insufficient documentation

## 2023-05-12 DIAGNOSIS — R053 Chronic cough: Secondary | ICD-10-CM | POA: Insufficient documentation

## 2023-05-12 DIAGNOSIS — E118 Type 2 diabetes mellitus with unspecified complications: Secondary | ICD-10-CM | POA: Insufficient documentation

## 2023-05-12 DIAGNOSIS — R0602 Shortness of breath: Secondary | ICD-10-CM | POA: Diagnosis not present

## 2023-05-12 DIAGNOSIS — I495 Sick sinus syndrome: Secondary | ICD-10-CM | POA: Diagnosis not present

## 2023-05-12 DIAGNOSIS — I4821 Permanent atrial fibrillation: Secondary | ICD-10-CM | POA: Diagnosis not present

## 2023-05-12 DIAGNOSIS — I2510 Atherosclerotic heart disease of native coronary artery without angina pectoris: Secondary | ICD-10-CM | POA: Diagnosis not present

## 2023-05-12 DIAGNOSIS — I441 Atrioventricular block, second degree: Secondary | ICD-10-CM | POA: Insufficient documentation

## 2023-05-12 DIAGNOSIS — D6869 Other thrombophilia: Secondary | ICD-10-CM | POA: Diagnosis not present

## 2023-05-12 DIAGNOSIS — Z950 Presence of cardiac pacemaker: Secondary | ICD-10-CM | POA: Diagnosis not present

## 2023-05-12 DIAGNOSIS — E049 Nontoxic goiter, unspecified: Secondary | ICD-10-CM | POA: Diagnosis not present

## 2023-05-12 DIAGNOSIS — I2721 Secondary pulmonary arterial hypertension: Secondary | ICD-10-CM | POA: Diagnosis not present

## 2023-05-12 MED ORDER — METOPROLOL TARTRATE 25 MG PO TABS: 25.0000 mg | ORAL_TABLET | Freq: Two times a day (BID) | ORAL | 3 refills | Status: DC

## 2023-05-12 NOTE — Progress Notes (Unsigned)
Patient ID: Hailey [name], female   DOB: [birth year], 77 y.o.   MRN: [id number]    Cardiology Office Note    Date:  05/12/2023   ID:  Frenchie, Konishi 1945-12-02, MRN [id number]  PCP:  [name], MD  Cardiologist:  Hillis Range, MD;  [name], MD   No chief complaint on file.   History of Present Illness:  Hailey [name] is a 77 y.o. female with longstanding persistent atrial fibrillation (s/p RF ablation procedures, now with longstanding persistent atrial fibrillation), s/p dual-chamber permanent pacemaker (St. Jude Accent 2012 for tachycardia-bradycardia syndrome and 2:1 atrioventricular block, generator change and new right ventricular lead for high pacing thresholds March 2021), chronic diastolic heart failure (history of acute decompensation on Multaq), multifactorial pulmonary artery hypertension, CAD (nonobstructive by cath February 2019), type 2 diabetes mellitus, hypertension, good lipid profile without therapy, s/p right hemithyroidectomy for large goiter, history of hyperthyroidism (no longer requiring meds).  She is having problems with decreased exercise tolerance, increased shortness of breath with activity.  She does not have orthopnea, PND or lower extremity edema.  She has not had problems with chest pain.  She complains of itching in her upper back that is sometimes intolerable, but has not had a rash.  It is worse at night.  She has had repeated problems with vaginal yeast infections and requires fluconazole at least every 2 months.  She also has a lot of problems with night cramps.  She has not had palpitations or syncope.  She has not had any problems with bleeding or falls.  Home situation remains difficult due to Phillip's progressive dementia.  He had a recent fall and required hip surgery and is currently in rehab.  Echo in January 2024 showed normal left ventricular systolic function but estimated her systolic PA pressure to be about 60 mmHg.  Her right  ventricle was moderately depressed and there was evidence of increased right heart filling pressures.  Right heart catheterization confirmed pulmonary hypertension 73/27 (mean 41), but there was also evidence of elevated left heart filling pressures with a wedge pressure of 23 (transpulmonary gradient 18).  PVR was 5.6 Woods units.  She had diminished cardiac output (index 2.1)  She has been in persistent atrial fibrillation with slow ventricular response for over 2 years.  Underlying rhythm is atrial fibrillation with ventricular rate in the 40s.  Her dual-chamber pacemaker is programmed VVIR.  The heart rate histograms look relatively flat.  She actually feels poorly with native rhythm and feels better when she has ventricular pacing (this was the case even before she developed atrial fibrillation).  Currently has 96% ventricular pacing.  Estimated gentle longevity is about 9 years.  She has not had any episodes of high ventricular rates.  Has seen Dr. Delton Coombes with a plan to improve compliance with CPAP.  echocardiogram July 2020 showed normal left ventricular systolic function and EF of 60-65%, pseudonormal mitral inflow, moderately enlarged right ventricle, biatrial mild dilation, estimated systolic PA pressure 40 mmHg.  Previous right heart catheterization showed that her pulmonary hypertension could not be explained exclusively based on elevated left heart filling pressures.  She was evaluated in the rheumatology clinic by Dr. Dierdre Forth since she had an elevated ANA of 1: 640, but all her other serological markers were negative for lupus, rheumatoid arthritis or other connective tissue diseases.  Chest CT has shown numerous bilateral pulmonary nodules felt to be nonspecific.  These have been present back as far as 2012.  Repeat echo in January 2024 showed evidence of dilation and weakening of the right ventricle and right heart catheterization showed PA pressure 73/27 (mean 41), wedge pressure 23,  transpulmonary gradient 18, PVR 5.6 Woods units, cardiac index 2.1, PAP 3.8, mixed venous sat 56-58%.  Past Medical History:  Diagnosis Date   Acute on chronic diastolic CHF (congestive heart failure), NYHA class 1 (HCC) 04/29/2013   Allergic rhinitis    Brady-tachy syndrome (HCC)    s/p STJ dual chamber PPM by Dr [name]   CAD (coronary artery disease)    70% RCA stenosis, treated medically   Control of atrial fibrillation with pacemaker (HCC)    Diabetes mellitus (HCC)    type 2   DJD (degenerative joint disease)    Dyspnea    occasional   Hearing loss    Right ear only - no hearing aids   Hypertension    Hyperthyroidism    Paroxysmal atrial fibrillation (HCC)    Pacemaker St Jude   Pneumonia    years ago- before 2012 per patient   Polio    as a child   PONV (postoperative nausea and vomiting)    Presence of permanent cardiac pacemaker    St Jude pacemaker   Pulmonary disease    Sleep apnea    uses  CPAP nightly    Past Surgical History:  Procedure Laterality Date   arm surgery d/t fx Right 1996   ATRIAL FIBRILLATION ABLATION  07/31/2012; 08-10-13   PVI by Dr [name]   ATRIAL FIBRILLATION ABLATION N/A 07/31/2012   Procedure: ATRIAL FIBRILLATION ABLATION;  Surgeon: Hillis Range, MD;  Location: [facility] CATH LAB;  Service: Cardiovascular;  Laterality: N/A;   ATRIAL FIBRILLATION ABLATION N/A 08/10/2013   Procedure: ATRIAL FIBRILLATION ABLATION;  Surgeon: Gardiner Rhyme, MD;  Location: [facility] CATH LAB;  Service: Cardiovascular;  Laterality: N/A;   BACK SURGERY  07/26/2012   dR. [facility]   CARDIAC CATHETERIZATION  12/03/2010   single vessel mid RCA   CARDIAC ELECTROPHYSIOLOGY MAPPING AND ABLATION  07/31/2012   Dr. Hillis Range   CESAREAN SECTION  09/20/1974   Dr. Greta Doom   CHOLECYSTECTOMY  1994   COLONOSCOPY WITH PROPOFOL N/A 03/21/2015   Procedure: COLONOSCOPY WITH PROPOFOL;  Surgeon: Charna Elizabeth, MD;  Location: [facility] ENDOSCOPY;  Service: Endoscopy;  Laterality: N/A;   DIAGNOSTIC  MAMMOGRAM  04/16/2017   Tylene Fantasia. Morris, DO   EYE SURGERY Bilateral    remove cataracts   LAPAROSCOPIC HYSTERECTOMY  Late 5s to early 39s   LEFT HEART CATH AND CORONARY ANGIOGRAPHY N/A 07/30/2017   Procedure: LEFT HEART CATH AND CORONARY ANGIOGRAPHY;  Surgeon: Lennette Bihari, MD;  Location: [facility] INVASIVE CV LAB;  Service: Cardiovascular;  Laterality: N/A;   LUMBAR LAMINECTOMY/DECOMPRESSION MICRODISCECTOMY Right 12/23/2012   Procedure: Right L5-S1 Laminotomy for resection of synovial cyst;  Surgeon: [name], MD;  Location: [facility] NEURO ORS;  Service: Neurosurgery;  Laterality: Right;  Right Lumbar five-sacral one Laminotomy for resection of synovial cyst   NM MYOCAR PERF WALL MOTION  12/20/2010   normal   PACEMAKER IMPLANT N/A 08/09/2019   Procedure: PACEMAKER IMPLANT;  Surgeon: [name], MD;  Location: [facility] INVASIVE CV LAB;  Service: Cardiovascular;  Laterality: N/A;   PACEMAKER INSERTION  12/03/2010   SJM Accent DR RF implanted by Dr [name]   RIGHT HEART CATH N/A 04/22/2018   Procedure: RIGHT HEART CATH;  Surgeon: Iran Ouch, MD;  Location: [facility] INVASIVE CV LAB;  Service: Cardiovascular;  Laterality:  N/A;   RIGHT HEART CATH N/A 07/09/2022   Procedure: RIGHT HEART CATH;  Surgeon: Dolores Patty, MD;  Location: [facility] INVASIVE CV LAB;  Service: Cardiovascular;  Laterality: N/A;   TEE WITHOUT CARDIOVERSION N/A 07/30/2012   Procedure: TRANSESOPHAGEAL ECHOCARDIOGRAM (TEE);  Surgeon: [name], MD;  Location: [facility] ENDOSCOPY;  Service: Cardiovascular;  Laterality: N/A;  h&p in file-hope   TEE WITHOUT CARDIOVERSION N/A 08/09/2013   Procedure: TRANSESOPHAGEAL ECHOCARDIOGRAM (TEE);  Surgeon: Lewayne Bunting, MD;  Location: [facility] ENDOSCOPY;  Service: Cardiovascular;  Laterality: N/A;   THYROID LOBECTOMY Right 09/20/2022   Procedure: RIGHT HEMITHYROIDECTOMY;  Surgeon: Laren Boom, DO;  Location: [facility] OR;  Service: ENT;  Laterality: Right;   TOTAL ABDOMINAL HYSTERECTOMY  1981    ULTRASOUND GUIDANCE FOR VASCULAR ACCESS  04/22/2018   Procedure: Ultrasound Guidance For Vascular Access;  Surgeon: Iran Ouch, MD;  Location: [facility] INVASIVE CV LAB;  Service: Cardiovascular;;    Current Medications: Outpatient Medications Prior to Visit  Medication Sig Dispense Refill   acetaminophen (TYLENOL) 500 MG tablet Take 500-1,000 mg by mouth every 6 (six) hours as needed (pain.).     Cholecalciferol (VITAMIN D3) 1000 UNITS CAPS Take 1,000 Units by mouth in the morning.     ELIQUIS 5 MG TABS tablet Take 5 mg by mouth 2 (two) times daily.     ferrous gluconate (FERGON) 324 MG tablet Take 1 tablet (324 mg total) by mouth every other day. 90 tablet 3   ferrous sulfate 324 MG TBEC Take 324 mg by mouth.     furosemide (LASIX) 80 MG tablet Take 1 tablet (80 mg total) by mouth every morning. (Patient not taking: Reported on 04/30/2023) 90 tablet 3   glucose blood (ONETOUCH ULTRA) test strip USE DAILY TO CHECK BLOOD SUGAR E11.8 100 each 11   Lancets Misc. (ONE TOUCH SURESOFT) MISC Use to test  Blood sugar once daily DX: E11.8, 1 each 0   levothyroxine (SYNTHROID) 25 MCG tablet Take 25 mcg by mouth daily before breakfast.     magnesium oxide (MAG-OX) 400 (240 Mg) MG tablet Take 400 mg by mouth daily.     methimazole (TAPAZOLE) 10 MG tablet TAKE ONE TABLET BY MOUTH EVERY DAY WITH FOOD 90 tablet 1   metoprolol tartrate (LOPRESSOR) 50 MG tablet TAKE ONE TABLET BY MOUTH EVERY MORNING AND 1/2 TABLET EVERY EVENING 135 tablet 3   ondansetron (ZOFRAN) 4 MG tablet Take 1 tablet (4 mg total) by mouth every 8 (eight) hours as needed for nausea or vomiting. 20 tablet 0   pantoprazole (PROTONIX) 40 MG tablet TAKE ONE TABLET BY MOUTH EVERY DAY 90 tablet 1   potassium chloride SA (KLOR-CON M) 20 MEQ tablet TAKE ONE TABLET BY MOUTH TWICE DAILY 180 tablet 2   sildenafil (REVATIO) 20 MG tablet TAKE ONE TABLET BY MOUTH 3 TIMES DAILY 90 tablet 6   spironolactone (ALDACTONE) 25 MG tablet Take 1 tablet (25 mg  total) by mouth daily. 90 tablet 3   No facility-administered medications prior to visit.     Allergies:   Codeine, Demerol hcl [meperidine], Ketorolac, Demerol, Multaq [dronedarone], Flexeril [cyclobenzaprine], Xarelto [rivaroxaban], and Latex   Social History   Socioeconomic History   Marital status: Married    Spouse name: Loistine Chance   Number of children: 3   Years of education: 13   Highest education level: Some college, no degree  Occupational History   Occupation: housewife  Tobacco Use   Smoking status: Never   Smokeless  tobacco: Never  Vaping Use   Vaping status: Never Used  Substance and Sexual Activity   Alcohol use: No   Drug use: No   Sexual activity: Not Currently    Birth control/protection: Abstinence, Surgical    Comment: Hysterectomy  Other Topics Concern   Not on file  Social History Narrative   Lives in [place].  Retired.  Was office clerk in several companies.  Did some technical school after completing high school.  She has no pets.  She and her husband live in a one-story home.  They've been married since 1965.  She follows a heart healthy diet.  She has a living will and hcpoa but does not have a DNR form.  She does struggle to prepare food.     Social Determinants of Health   Financial Resource Strain: Not on file  Food Insecurity: Low Risk  (11/11/2022)   [facility]   Hunger Vital Sign    Worried About Running Out of Food in the Last Year: Never true    Ran Out of Food in the Last Year: Never true  Transportation Needs: No Transportation Needs (09/20/2022)   PRAPARE - Administrator, Civil Service (Medical): No    Lack of Transportation (Non-Medical): No  Physical Activity: Not on file  Stress: Not on file  Social Connections: Not on file     Family History:  The patient's family history includes Breast cancer in her sister, sister, and sister; Cancer in her brother; Colon cancer in her sister; Dementia  in her brother and sister; Diabetes in her sister; Heart attack in her sister; Heart disease in her sister and sister; Prostate cancer in her brother; Suicidality in her brother.   ROS:   Please see the history of present illness.    All other systems are reviewed and are negative.   PHYSICAL EXAM:   VS:  There were no vitals taken for this visit.     General: Alert, oriented x3, no distress, ill to the subclavian pacemaker site Head: no evidence of trauma, PERRL, EOMI, no exophtalmos or lid lag, no myxedema, no xanthelasma; normal ears, nose and oropharynx Neck:  5 cm  jugular venous pulsations and no hepatojugular reflux; brisk carotid pulses without delay and no carotid bruits Chest: clear to auscultation, no signs of consolidation by percussion or palpation, normal fremitus, symmetrical and full respiratory excursions Cardiovascular: normal position and quality of the apical impulse, regular rhythm, normal first and paradoxically split second heart sounds, no murmurs, rubs or gallops Abdomen: no tenderness or distention, no masses by palpation, no abnormal pulsatility or arterial bruits, normal bowel sounds, no hepatosplenomegaly Extremities: no clubbing, cyanosis or edema; 2+ radial, ulnar and brachial pulses bilaterally; 2+ right femoral, posterior tibial and dorsalis pedis pulses; 2+ left femoral, posterior tibial and dorsalis pedis pulses; no subclavian or femoral bruits Neurological: grossly nonfocal Psych: Normal mood and affect    Wt Readings from Last 3 Encounters:  04/15/23 120 lb 6.4 oz (54.6 kg)  03/04/23 118 lb 6.4 oz (53.7 kg)  02/26/23 119 lb 12.8 oz (54.3 kg)     Studies Reviewed: Marland Kitchen     EKG Interpretation Date/Time:  Tuesday March 04 2023 12:02:58 EDT Ventricular Rate:  60 PR Interval:    QRS Duration:  120 QT Interval:  464 QTC Calculation: 464 R Axis:   -48  Text Interpretation: Ventricular-paced rhythm When compared with ECG of 09-Jul-2022 08:44,  Electronic ventricular pacemaker  has replaced Wide QRS rhythm Confirmed by Georgene Kopper [id number]) on 03/04/2023 12:13:11 PM    Right heart catheterization 07/09/2022 RA = 12 RV = 76/11 PA = 73/27 (41) PCW = 23 Fick cardiac output/index = 3.2/2.1 PVR = 5.6 WU Ao sat = 96% PA sat = 56%, 58% PAPi = 3.8   Assessment: 1. Mixed moderate pulmonary venous and pulmonary arterial HTN with elevated filling pressures and low cardiac output   Plan/Discussion:    Options limited. Not candidate for selective pulmonary artery vasodilators. Would push diuretics as tolerated. Ensure oxygenation > 88% at all times. Consider pulmonary rehab.    Echocardiogram 07/01/2022  1. Evidence for moderate pulmonary HTN Estimated PAs pressure 62 mmHg.   2. Left ventricular ejection fraction, by estimation, is 60 to 65%. The  left ventricle has normal function. The left ventricle has no regional  wall motion abnormalities. Left ventricular diastolic parameters were  normal.   3. Catheter in RA/RV . Right ventricular systolic function is moderately  reduced. The right ventricular size is normal. There is moderately  elevated pulmonary artery systolic pressure.   4. Left atrial size was mildly dilated.   5. Right atrial size was moderately dilated.   6. The mitral valve is abnormal. No evidence of mitral valve  regurgitation. No evidence of mitral stenosis.   7. Tricuspid valve regurgitation is severe.   8. The aortic valve is tricuspid. Aortic valve regurgitation is trivial.  No aortic stenosis is present.   9. The inferior vena cava is dilated in size with <50% respiratory  variability, suggesting right atrial pressure of 15 mmHg.   Risk Assessment/Calculations:    CHA2DS2-VASc Score = 7   This indicates a 11.2% annual risk of stroke. The patient's score is based upon: CHF History: 1 HTN History: 1 Diabetes History: 1 Stroke History: 0 Vascular Disease History: 1 Age Score: 2 Gender Score: 1    {This patient has a significant risk of stroke if diagnosed with atrial fibrillation.  Please consider VKA or DOAC agent for anticoagulation if the bleeding risk is acceptable.   You can also use the SmartPhrase .[email or web address] for documentation.   :[id number]}      Lipid Panel    Component Value Date/Time   CHOL 125 11/13/2020 0955   TRIG 70 11/13/2020 0955   HDL 50 11/13/2020 0955   CHOLHDL 2.5 11/13/2020 0955   VLDL 10 11/24/2010 0837   LDLCALC 60 11/13/2020 0955   BMET    Component Value Date/Time   NA 135 04/15/2023 1544   NA 135 11/19/2022 1418   K 4.0 04/15/2023 1544   CL 96 (L) 04/15/2023 1544   CO2 23 04/15/2023 1544   GLUCOSE 112 (H) 04/15/2023 1544   BUN 15 04/15/2023 1544   BUN 20 11/19/2022 1418   CREATININE 1.08 (H) 04/15/2023 1544   CALCIUM 9.5 04/15/2023 1544   GFRNONAA 44 (L) 09/16/2022 1431   GFRNONAA 43 (L) 11/13/2020 0955   GFRAA 50 (L) 11/13/2020 0955   Hemoglobin A1c 6.3% on 09/16/2022 ASSESSMENT:    No diagnosis found.    PLAN:  In order of problems listed above:   CHF: Normal LV systolic function, but mildly elevated PCWP at cath consistent with diastolic heart failure.  Has frequent vaginal yeast infections and lots of muscle cramps, likely due to [place].  Will stop this.  Will probably need a higher dose of furosemide.  Keep an eye on weight and continue to follow a low-sodium diet.  Note that  BNP was modestly elevated in February when she had her heart cath and 173. PAH: Although there is a component of left heart failure this is mostly precapillary hypertension.  The exact mechanism is uncertain although she has a markedly positive ANA without other findings to suggest systemic autoimmune disease.  No evidence of progressive interstitial lung disease by imaging studies.  In the past had a good clinical and echocardiographic response to chronic treatment with sildenafil, but now PA pressure has increased and there is evidence of right heart  failure.  I think this is the major cause of her shortness of breath and limited exercise tolerance. AFib: Now a permanent arrhythmia with slow ventricular rates and virtually 100% ventricular pacing.   She has a remarkably narrow paced QRS complex and actually feels better with ventricular pacing compared to when we allow native AV conduction during threshold testing or when we check underlying rhythm.  CHADSVasc at least 7 (age 86, gender, DM, CAD, HTN, CHF). Anticoagulation: No bleeding complications with Eliquis.  Has a history of GI intolerance to Xarelto. 2nd deg AVB: 95% ventricular paced rhythm.  She feels best when she has ventricular pacing.  She felt extremely poorly when she lost ventricular capture in the past and she feels unwell when we check underlying rhythm or perform ventricular pacing threshold checks.  SSS: Precedes diagnosis of AV block and persistent atrial fibrillation and was the initial reason for device implantation. PPM: Received a new generator and a new right ventricular lead in 2020.  Normal device check.  Will increase the rate response settings to see if this leads to any improvement in her activity tolerance.  Continue remote downloads every 3 months and yearly office visits or more frequently based on clinical status CAD: Denies angina.  Had nonobstructive disease at most recent cath in February 2019.  Recent lipid profile shows LDL cholesterol 60. DM: Good glycemic control. Goiter: s/p hemithyroidectomy, now euthyroid. Pruritus: There is no evidence of a rash.  I do not think this is an allergic response to sildenafil.  Will see if he gets better with discontinuation of Farxiga.  Reevaluate in a couple of months.         Dispo: Stop Marcelline Deist, return in 2 months.  Signed, [name], MD

## 2023-05-12 NOTE — Patient Instructions (Signed)
Medication Instructions:  Decrease Metoprolol to 25 mg twice a day *If you need a refill on your cardiac medications before your next appointment, please call your pharmacy*   Lab Work: TSH, BMP, BNP If you have labs (blood work) drawn today and your tests are completely normal, you will receive your results only by: MyChart Message (if you have MyChart) OR A paper copy in the mail If you have any lab test that is abnormal or we need to change your treatment, we will call you to review the results.  Follow-Up: At [facility], you and your health needs are our priority.  As part of our continuing mission to provide you with exceptional heart care, we have created designated Provider Care Teams.  These Care Teams include your primary Cardiologist (physician) and Advanced Practice Providers (APPs -  Physician Assistants and Nurse Practitioners) who all work together to provide you with the care you need, when you need it.  We recommend signing up for the patient portal called "MyChart".  Sign up information is provided on this After Visit Summary.  MyChart is used to connect with patients for Virtual Visits (Telemedicine).  Patients are able to view lab/test results, encounter notes, upcoming appointments, etc.  Non-urgent messages can be sent to your provider as well.   To learn more about what you can do with MyChart, go to [email or web address].    Your next appointment:   6 month(s)  Provider:   [name], MD

## 2023-05-12 NOTE — Telephone Encounter (Signed)
Notified patient that her PA for Sildenafil has been approved. She is aware and has already picked up the medication.

## 2023-05-12 NOTE — Telephone Encounter (Signed)
Pt is aware and has already picked up medication.

## 2023-05-13 ENCOUNTER — Ambulatory Visit (HOSPITAL_COMMUNITY)
Admission: RE | Admit: 2023-05-13 | Discharge: 2023-05-13 | Disposition: A | Discharge status: Home / Self Care | Payer: Medicare Other | Source: Ambulatory Visit | Attending: Sports Medicine | Admitting: Sports Medicine

## 2023-05-13 ENCOUNTER — Encounter: Payer: Self-pay | Admitting: Cardiovascular Disease

## 2023-05-13 ENCOUNTER — Encounter: Payer: Self-pay | Admitting: Sports Medicine

## 2023-05-13 ENCOUNTER — Ambulatory Visit (INDEPENDENT_AMBULATORY_CARE_PROVIDER_SITE_OTHER): Payer: Medicare Other | Admitting: Sports Medicine

## 2023-05-13 VITALS — BP 118/64 | HR 80 | Temp 96.9°F | Resp 16 | Ht 59.0 in | Wt 121.6 lb

## 2023-05-13 DIAGNOSIS — R0982 Postnasal drip: Secondary | ICD-10-CM

## 2023-05-13 DIAGNOSIS — E119 Type 2 diabetes mellitus without complications: Secondary | ICD-10-CM | POA: Diagnosis not present

## 2023-05-13 DIAGNOSIS — R1030 Lower abdominal pain, unspecified: Secondary | ICD-10-CM | POA: Diagnosis not present

## 2023-05-13 DIAGNOSIS — K5900 Constipation, unspecified: Secondary | ICD-10-CM | POA: Diagnosis not present

## 2023-05-13 DIAGNOSIS — R058 Other specified cough: Secondary | ICD-10-CM | POA: Diagnosis not present

## 2023-05-13 DIAGNOSIS — K5790 Diverticulosis of intestine, part unspecified, without perforation or abscess without bleeding: Secondary | ICD-10-CM

## 2023-05-13 DIAGNOSIS — Z23 Encounter for immunization: Secondary | ICD-10-CM

## 2023-05-13 DIAGNOSIS — D649 Anemia, unspecified: Secondary | ICD-10-CM | POA: Diagnosis not present

## 2023-05-13 LAB — BASIC METABOLIC PANEL
BUN/Creatinine Ratio: 10 — ABNORMAL LOW (ref 12–28)
BUN: 12 mg/dL (ref 8–27)
CO2: 22 mmol/L (ref 20–29)
Calcium: 9.2 mg/dL (ref 8.7–10.3)
Chloride: 98 mmol/L (ref 96–106)
Creatinine, Ser: 1.18 mg/dL — ABNORMAL HIGH (ref 0.57–1.00)
Glucose: 82 mg/dL (ref 70–99)
Potassium: 5 mmol/L (ref 3.5–5.2)
Sodium: 138 mmol/L (ref 134–144)
eGFR: 47 mL/min/{1.73_m2} — ABNORMAL LOW (ref >59)

## 2023-05-13 LAB — BRAIN NATRIURETIC PEPTIDE: BNP: 116.3 pg/mL — ABNORMAL HIGH (ref 0.0–100.0)

## 2023-05-13 LAB — TSH: TSH: 4.65 u[IU]/mL — ABNORMAL HIGH (ref 0.450–4.500)

## 2023-05-13 MED ORDER — BENZONATATE 200 MG PO CAPS: 200.0000 mg | ORAL_CAPSULE | Freq: Two times a day (BID) | ORAL | 0 refills | Status: DC | PRN

## 2023-05-13 MED ORDER — DOCUSATE SODIUM 100 MG PO CAPS
100.0000 mg | ORAL_CAPSULE | Freq: Two times a day (BID) | ORAL | 3 refills | Status: DC
Start: 2023-05-13 — End: 2023-06-10

## 2023-05-13 MED ORDER — FLUTICASONE PROPIONATE 50 MCG/ACT NA SUSP: 2.0000 | Freq: Every day | NASAL | 6 refills | Status: DC

## 2023-05-13 NOTE — Progress Notes (Signed)
Careteam: Patient Care Team: [name], MD as PCP - General (Internal Medicine) Croitoru, Rachelle Hora, MD as PCP - Cardiology (Cardiology) Leslye Peer, MD as Consulting Physician (Pulmonary Disease) Blima Ledger, OD (Optometry) Mitchel Honour, DO as Consulting Physician (Obstetrics and Gynecology)  PLACE OF SERVICE:  [facility]  Advanced Directive information    Allergies  Allergen Reactions   Codeine Nausea And Vomiting and Nausea Only   Demerol Hcl [Meperidine] Nausea Only   Ketorolac Nausea And Vomiting, Nausea Only and Other (See Comments)    "pt told to never take this med"   Demerol Nausea And Vomiting   Multaq [Dronedarone] Other (See Comments)    Increased fluid retention   Flexeril [Cyclobenzaprine] Nausea And Vomiting   Xarelto [Rivaroxaban] Diarrhea   Latex Rash    Pt stated on 09/20/2022    Chief Complaint  Patient presents with   Acute Visit    Patient complains of left sided pain.      HPI: Patient is a 77 y.o. female presented to clinic for acute visit for  C/o pain in LLQ with pain radiating to her back  Duration - off and on since few months  Feels nauseous  No  vomiting Denies dysuria Ct showed  Stomach/Bowel: On this non oral contrast exam, the large bowel has a normal course and caliber with scattered colonic stool. Few colonic diverticula. Normal appendix seen extending medial to the cecum in the right hemipelvis. The stomach is underdistended. Question slight fold thickening. Small bowel is nondilated. No BM since last 2 days   Cough since 1 week Denies fevers, sob + post nasal discharge  Takes cetrizine   H/o Hemithyroidectomy  Follows with endo  As per med list pt on both methimazole and levothyroxine Instructed son to contact endocrinology office to confirm her meds   Review of Systems:  Review of Systems  Constitutional:  Negative for chills and fever.  HENT:  Negative for congestion, sinus pain and sore throat.    Eyes:  Negative for double vision.  Respiratory:  Positive for cough. Negative for sputum production and shortness of breath.   Cardiovascular:  Negative for chest pain, palpitations and leg swelling.  Gastrointestinal:  Positive for abdominal pain and nausea. Negative for blood in stool and heartburn.  Genitourinary:  Negative for dysuria, frequency and hematuria.  Musculoskeletal:  Negative for falls and myalgias.  Neurological:  Negative for dizziness, sensory change and focal weakness.   Negative unless indicated in HPI.   Past Medical History:  Diagnosis Date   Acute on chronic diastolic CHF (congestive heart failure), NYHA class 1 (HCC) 04/29/2013   Allergic rhinitis    Brady-tachy syndrome (HCC)    s/p STJ dual chamber PPM by Dr [name]   CAD (coronary artery disease)    70% RCA stenosis, treated medically   Control of atrial fibrillation with pacemaker (HCC)    Diabetes mellitus (HCC)    type 2   DJD (degenerative joint disease)    Dyspnea    occasional   Hearing loss    Right ear only - no hearing aids   Hypertension    Hyperthyroidism    Paroxysmal atrial fibrillation (HCC)    Pacemaker St Jude   Pneumonia    years ago- before 2012 per patient   Polio    as a child   PONV (postoperative nausea and vomiting)    Presence of permanent cardiac pacemaker    St Jude pacemaker   Pulmonary disease  Sleep apnea    uses  CPAP nightly   Past Surgical History:  Procedure Laterality Date   arm surgery d/t fx Right 1996   ATRIAL FIBRILLATION ABLATION  07/31/2012; 08-10-13   PVI by Dr [name]   ATRIAL FIBRILLATION ABLATION N/A 07/31/2012   Procedure: ATRIAL FIBRILLATION ABLATION;  Surgeon: Hillis Range, MD;  Location: [facility] CATH LAB;  Service: Cardiovascular;  Laterality: N/A;   ATRIAL FIBRILLATION ABLATION N/A 08/10/2013   Procedure: ATRIAL FIBRILLATION ABLATION;  Surgeon: Gardiner Rhyme, MD;  Location: [facility] CATH LAB;  Service: Cardiovascular;  Laterality: N/A;   BACK  SURGERY  07/26/2012   [facility]. [facility]   CARDIAC CATHETERIZATION  12/03/2010   single vessel mid RCA   CARDIAC ELECTROPHYSIOLOGY MAPPING AND ABLATION  07/31/2012   Dr. Hillis Range   CESAREAN SECTION  09/20/1974   Dr. Greta Doom   CHOLECYSTECTOMY  1994   COLONOSCOPY WITH PROPOFOL N/A 03/21/2015   Procedure: COLONOSCOPY WITH PROPOFOL;  Surgeon: Charna Elizabeth, MD;  Location: [facility] ENDOSCOPY;  Service: Endoscopy;  Laterality: N/A;   DIAGNOSTIC MAMMOGRAM  04/16/2017   Tylene Fantasia. Morris, DO   EYE SURGERY Bilateral    remove cataracts   LAPAROSCOPIC HYSTERECTOMY  Late 52s to early 53s   LEFT HEART CATH AND CORONARY ANGIOGRAPHY N/A 07/30/2017   Procedure: LEFT HEART CATH AND CORONARY ANGIOGRAPHY;  Surgeon: Lennette Bihari, MD;  Location: [facility] INVASIVE CV LAB;  Service: Cardiovascular;  Laterality: N/A;   LUMBAR LAMINECTOMY/DECOMPRESSION MICRODISCECTOMY Right 12/23/2012   Procedure: Right L5-S1 Laminotomy for resection of synovial cyst;  Surgeon: [name], MD;  Location: [facility] NEURO ORS;  Service: Neurosurgery;  Laterality: Right;  Right Lumbar five-sacral one Laminotomy for resection of synovial cyst   NM MYOCAR PERF WALL MOTION  12/20/2010   normal   PACEMAKER IMPLANT N/A 08/09/2019   Procedure: PACEMAKER IMPLANT;  Surgeon: [name], MD;  Location: [facility] INVASIVE CV LAB;  Service: Cardiovascular;  Laterality: N/A;   PACEMAKER INSERTION  12/03/2010   SJM Accent DR RF implanted by Dr [name]   RIGHT HEART CATH N/A 04/22/2018   Procedure: RIGHT HEART CATH;  Surgeon: Iran Ouch, MD;  [facility]VAS[facility] CV LAB;  Service: Cardiovascular;  Laterality: N/A;   RIGHT HEART CATH N/A 07/09/2022   Procedure: RIGHT HEART CATH;  Surgeon: Dolores Patty, MD;  Location: [facility] INVASIVE CV LAB;  Service: Cardiovascular;  Laterality: N/A;   TEE WITHOUT CARDIOVERSION N/A 07/30/2012   Procedure: TRANSESOPHAGEAL ECHOCARDIOGRAM (TEE);  Surgeon: [name], MD;  Location[facility] ENDOSCOPY;  Service:  Cardiovascular;  Laterality: N/A;  h&p in file-hope   TEE WITHOUT CARDIOVERSION N/A 08/09/2013   Procedure: TRANSESOPHAGEAL ECHOCARDIOGRAM (TEE);  Surgeon: Lewayne Bunting, MD;  Location: [facility] ENDOSCOPY;  Service: Cardiovascular;  Laterality: N/A;   THYROID LOBECTOMY Right 09/20/2022   Procedure: RIGHT HEMITHYROIDECTOMY;  Surgeon: Laren Boom, DO;  Location: [facility] OR;  Service: ENT;  Laterality: Right;   TOTAL ABDOMINAL HYSTERECTOMY  1981   ULTRASOUND GUIDANCE FOR VASCULAR ACCESS  04/22/2018   Procedure: Ultrasound Guidance For Vascular Access;  Surgeon: Iran Ouch, MD;  Location: [facility] INVASIVE CV LAB;  Service: Cardiovascular;;   Social History:   reports that she has never smoked. She has never used smokeless tobacco. She reports that she does not drink alcohol and does not use drugs.  Family History  Problem Relation Age of Onset   Cancer Brother    Heart attack Sister    Breast cancer Sister    Diabetes  Sister    Heart disease Sister    Breast cancer Sister    Dementia Sister    Colon cancer Sister    Heart disease Sister    Breast cancer Sister    Suicidality Brother    Prostate cancer Brother    Dementia Brother     Medications: Patient's Medications  New Prescriptions   No medications on file  Previous Medications   ACETAMINOPHEN (TYLENOL) 500 MG TABLET    Take 500-1,000 mg by mouth every 6 (six) hours as needed (pain.).   CHOLECALCIFEROL (VITAMIN D3) 1000 UNITS CAPS    Take 1,000 Units by mouth in the morning.   ELIQUIS 5 MG TABS TABLET    Take 5 mg by mouth 2 (two) times daily.   FERROUS GLUCONATE (FERGON) 324 MG TABLET    Take 1 tablet (324 mg total) by mouth every other day.   FUROSEMIDE (LASIX) 80 MG TABLET    Take 40 mg by mouth in the morning and at bedtime. 1/2 tablet morning and night   GLUCOSE BLOOD (ONETOUCH ULTRA) TEST STRIP    USE DAILY TO CHECK BLOOD SUGAR E11.8   LANCETS MISC. (ONE TOUCH SURESOFT) MISC    Use to test  Blood sugar once daily DX:  E11.8,   LEVOTHYROXINE (SYNTHROID) 25 MCG TABLET    Take 25 mcg by mouth daily before breakfast.   MAGNESIUM OXIDE (MAG-OX) 400 (240 MG) MG TABLET    Take 400 mg by mouth daily.   METHIMAZOLE (TAPAZOLE) 10 MG TABLET    TAKE ONE TABLET BY MOUTH EVERY DAY WITH FOOD   METOPROLOL TARTRATE (LOPRESSOR) 25 MG TABLET    Take 1 tablet (25 mg total) by mouth 2 (two) times daily.   ONDANSETRON (ZOFRAN) 4 MG TABLET    Take 1 tablet (4 mg total) by mouth every 8 (eight) hours as needed for nausea or vomiting.   PANTOPRAZOLE (PROTONIX) 40 MG TABLET    TAKE ONE TABLET BY MOUTH EVERY DAY   POTASSIUM CHLORIDE SA (KLOR-CON M) 20 MEQ TABLET    TAKE ONE TABLET BY MOUTH TWICE DAILY   SILDENAFIL (REVATIO) 20 MG TABLET    TAKE ONE TABLET BY MOUTH 3 TIMES DAILY   SPIRONOLACTONE (ALDACTONE) 25 MG TABLET    Take 1 tablet (25 mg total) by mouth daily.  Modified Medications   No medications on file  Discontinued Medications   FUROSEMIDE (LASIX) 80 MG TABLET    Take 1 tablet (80 mg total) by mouth every morning.    Physical Exam: Vitals:   05/13/23 1340  BP: 118/64  Pulse: 80  Resp: 16  Temp: (!) 96.9 F (36.1 C)  SpO2: 96%  Weight: 121 lb 9.6 oz (55.2 kg)  Height: 4\' 11"  (1.499 m)   Body mass index is 24.56 kg/m. BP Readings from Last 3 Encounters:  05/13/23 118/64  05/12/23 122/62  04/30/23 126/73   Wt Readings from Last 3 Encounters:  05/13/23 121 lb 9.6 oz (55.2 kg)  05/12/23 121 lb 6.4 oz (55.1 kg)  04/15/23 120 lb 6.4 oz (54.6 kg)    Physical Exam Constitutional:      Appearance: Normal appearance.  HENT:     Head: Normocephalic and atraumatic.  Cardiovascular:     Rate and Rhythm: Normal rate and regular rhythm.  Pulmonary:     Effort: Pulmonary effort is normal. No respiratory distress.     Breath sounds: Normal breath sounds. No wheezing.  Abdominal:     General:  Bowel sounds are normal. There is no distension.     Tenderness: There is abdominal tenderness. There is no guarding or  rebound.     Comments:  Lower abdominal tenderness BS +    Musculoskeletal:        General: No swelling or tenderness.  Neurological:     Mental Status: She is alert. Mental status is at baseline.     Sensory: No sensory deficit.     Motor: No weakness.     Labs reviewed: Basic Metabolic Panel: Recent Labs    11/01/22 1233 11/19/22 1418 01/13/23 1300 04/15/23 1544 05/12/23 1626  NA 134 135  --  135 138  K 4.7 4.3  --  4.0 5.0  CL 94* 95*  --  96* 98  CO2 20 22  --  23 22  GLUCOSE 113* 94  --  112* 82  BUN 18 20  --  15 12  CREATININE 1.34* 1.41* 1.40* 1.08* 1.18*  CALCIUM 9.1 9.0  --  9.5 9.2  MG 2.4* 2.5*  --  2.2  --   TSH  --   --   --   --  4.650*   Liver Function Tests: Recent Labs    08/01/22 1434  AST 17  ALT 8  ALKPHOS 123*  BILITOT 1.2  PROT 6.7  ALBUMIN 4.4   No results for input(s): "LIPASE", "AMYLASE" in the last 8760 hours. No results for input(s): "AMMONIA" in the last 8760 hours. CBC: Recent Labs    08/01/22 1434 09/16/22 1431 04/15/23 1544  WBC 7.9 8.8 7.0  NEUTROABS  --   --  4,949  HGB 12.2 12.9 11.2*  HCT 38.5 41.0 38.6  MCV 87 86.0 78.9*  PLT 279 282 350   Lipid Panel: No results for input(s): "CHOL", "HDL", "LDLCALC", "TRIG", "CHOLHDL", "LDLDIRECT" in the last 8760 hours. TSH: Recent Labs    05/12/23 1626  TSH 4.650*   A1C: Lab Results  Component Value Date   HGBA1C 6.3 (H) 09/16/2022     Assessment/Plan 1. Diverticulosis 2. Constipation, unspecified constipation type Instructed to take colace twice daily  Take mirlax prn  Increase oral and fiber intake  - docusate sodium (COLACE) 100 MG capsule; Take 1 capsule (100 mg total) by mouth 2 (two) times daily.  Dispense: 60 capsule; Refill: 3  3. Lower abdominal pain Tender on palpation  BS + , pt passing gas Will check c ray KUB  - Ambulatory referral to Gastroenterology - DG Abd 1 View; Future - CBC (no diff) - Basic Metabolic Panel with eGFR  4. Dry  cough POTS NASAL DRIP + No sinus tenderness  - benzonatate (TESSALON) 200 MG capsule; Take 1 capsule (200 mg total) by mouth 2 (two) times daily as needed for cough.  Dispense: 20 capsule; Refill: 0  5. Post-nasal drip Cont with cetrizine  Will start flonase  - fluticasone (FLONASE) 50 MCG/ACT nasal spray; Place 2 sprays into both nostrils daily.  Dispense: 16 g; Refill: 6  6. Type 2 diabetes mellitus without complication, without long-term current use of insulin (HCC) Will check A1c, urine microalbumin - Hemoglobin A1c - Microalbumin/Creatinine Ratio, Urine  7. Need for pneumococcal 20-valent conjugate vaccination - Pneumococcal conjugate vaccine 20-valent (Prevnar 20)  Other orders - furosemide (LASIX) 80 MG tablet; Take 40 mg by mouth in the morning and at bedtime. 1/2 tablet morning and night   No follow-ups on file.:

## 2023-05-14 ENCOUNTER — Other Ambulatory Visit: Payer: Self-pay | Admitting: Sports Medicine

## 2023-05-14 LAB — MICROALBUMIN / CREATININE URINE RATIO
Creatinine, Urine: 65 mg/dL (ref 20–275)
Microalb Creat Ratio: 15 mg/g{creat} (ref <30)
Microalb, Ur: 1 mg/dL

## 2023-05-14 MED ORDER — METFORMIN HCL 500 MG PO TABS: 500.0000 mg | ORAL_TABLET | Freq: Two times a day (BID) | ORAL | 3 refills | Status: DC

## 2023-05-15 DIAGNOSIS — H26493 Other secondary cataract, bilateral: Secondary | ICD-10-CM | POA: Diagnosis not present

## 2023-05-15 DIAGNOSIS — H18413 Arcus senilis, bilateral: Secondary | ICD-10-CM | POA: Diagnosis not present

## 2023-05-15 DIAGNOSIS — H02831 Dermatochalasis of right upper eyelid: Secondary | ICD-10-CM | POA: Diagnosis not present

## 2023-05-15 DIAGNOSIS — Z961 Presence of intraocular lens: Secondary | ICD-10-CM | POA: Diagnosis not present

## 2023-05-15 DIAGNOSIS — H26491 Other secondary cataract, right eye: Secondary | ICD-10-CM | POA: Diagnosis not present

## 2023-05-16 DIAGNOSIS — I5032 Chronic diastolic (congestive) heart failure: Secondary | ICD-10-CM | POA: Diagnosis not present

## 2023-05-16 DIAGNOSIS — Z515 Encounter for palliative care: Secondary | ICD-10-CM | POA: Diagnosis not present

## 2023-05-16 LAB — IRON,TIBC AND FERRITIN PANEL
%SAT: 5 % — ABNORMAL LOW (ref 16–45)
Ferritin: 8 ng/mL — ABNORMAL LOW (ref 16–288)
Iron: 20 ug/dL — ABNORMAL LOW (ref 45–160)
TIBC: 444 ug/dL (ref 250–450)

## 2023-05-16 LAB — HEMOGLOBIN A1C
Hgb A1c MFr Bld: 7.3 %{Hb} — ABNORMAL HIGH (ref <5.7)
Mean Plasma Glucose: 163 mg/dL
eAG (mmol/L): 9 mmol/L

## 2023-05-16 LAB — BASIC METABOLIC PANEL WITH GFR
BUN/Creatinine Ratio: 12 (calc) (ref 6–22)
BUN: 13 mg/dL (ref 7–25)
CO2: 27 mmol/L (ref 20–32)
Calcium: 9.4 mg/dL (ref 8.6–10.4)
Chloride: 100 mmol/L (ref 98–110)
Creat: 1.09 mg/dL — ABNORMAL HIGH (ref 0.60–1.00)
Glucose, Bld: 96 mg/dL (ref 65–99)
Potassium: 4.6 mmol/L (ref 3.5–5.3)
Sodium: 136 mmol/L (ref 135–146)
eGFR: 52 mL/min/{1.73_m2} — ABNORMAL LOW (ref >=60)

## 2023-05-16 LAB — TEST AUTHORIZATION

## 2023-05-16 LAB — CBC
HCT: 35.4 % (ref 35.0–45.0)
Hemoglobin: 10.4 g/dL — ABNORMAL LOW (ref 11.7–15.5)
MCH: 23 pg — ABNORMAL LOW (ref 27.0–33.0)
MCHC: 29.4 g/dL — ABNORMAL LOW (ref 32.0–36.0)
MCV: 78.3 fL — ABNORMAL LOW (ref 80.0–100.0)
MPV: 11.2 fL (ref 7.5–12.5)
Platelets: 317 10*3/uL (ref 140–400)
RBC: 4.52 10*6/uL (ref 3.80–5.10)
RDW: 15.5 % — ABNORMAL HIGH (ref 11.0–15.0)
WBC: 7 10*3/uL (ref 3.8–10.8)

## 2023-05-23 ENCOUNTER — Encounter: Payer: Self-pay | Admitting: Cardiovascular Disease

## 2023-05-23 DIAGNOSIS — H524 Presbyopia: Secondary | ICD-10-CM | POA: Diagnosis not present

## 2023-05-23 DIAGNOSIS — H26493 Other secondary cataract, bilateral: Secondary | ICD-10-CM | POA: Diagnosis not present

## 2023-05-23 DIAGNOSIS — H5201 Hypermetropia, right eye: Secondary | ICD-10-CM | POA: Diagnosis not present

## 2023-05-23 DIAGNOSIS — Z9841 Cataract extraction status, right eye: Secondary | ICD-10-CM | POA: Diagnosis not present

## 2023-05-23 DIAGNOSIS — H52223 Regular astigmatism, bilateral: Secondary | ICD-10-CM | POA: Diagnosis not present

## 2023-05-23 DIAGNOSIS — Z961 Presence of intraocular lens: Secondary | ICD-10-CM | POA: Diagnosis not present

## 2023-05-25 ENCOUNTER — Other Ambulatory Visit: Payer: Self-pay

## 2023-05-25 ENCOUNTER — Encounter (HOSPITAL_COMMUNITY): Payer: Self-pay | Admitting: Emergency Medicine

## 2023-05-25 ENCOUNTER — Emergency Department (HOSPITAL_COMMUNITY)
Admission: EM | Admit: 2023-05-25 | Discharge: 2023-05-25 | Disposition: A | Discharge status: Home / Self Care | Payer: Medicare Other | Attending: Emergency Medicine | Admitting: Emergency Medicine

## 2023-05-25 ENCOUNTER — Emergency Department (HOSPITAL_COMMUNITY): Payer: Medicare Other

## 2023-05-25 DIAGNOSIS — I480 Paroxysmal atrial fibrillation: Secondary | ICD-10-CM | POA: Insufficient documentation

## 2023-05-25 DIAGNOSIS — Z91040 Latex allergy status: Secondary | ICD-10-CM | POA: Diagnosis not present

## 2023-05-25 DIAGNOSIS — I110 Hypertensive heart disease with heart failure: Secondary | ICD-10-CM | POA: Diagnosis not present

## 2023-05-25 DIAGNOSIS — R1032 Left lower quadrant pain: Secondary | ICD-10-CM | POA: Diagnosis not present

## 2023-05-25 DIAGNOSIS — I509 Heart failure, unspecified: Secondary | ICD-10-CM | POA: Insufficient documentation

## 2023-05-25 DIAGNOSIS — E039 Hypothyroidism, unspecified: Secondary | ICD-10-CM | POA: Insufficient documentation

## 2023-05-25 DIAGNOSIS — Z7901 Long term (current) use of anticoagulants: Secondary | ICD-10-CM | POA: Insufficient documentation

## 2023-05-25 DIAGNOSIS — Z7984 Long term (current) use of oral hypoglycemic drugs: Secondary | ICD-10-CM | POA: Diagnosis not present

## 2023-05-25 DIAGNOSIS — K5730 Diverticulosis of large intestine without perforation or abscess without bleeding: Secondary | ICD-10-CM | POA: Diagnosis not present

## 2023-05-25 DIAGNOSIS — E119 Type 2 diabetes mellitus without complications: Secondary | ICD-10-CM | POA: Insufficient documentation

## 2023-05-25 DIAGNOSIS — Z9049 Acquired absence of other specified parts of digestive tract: Secondary | ICD-10-CM | POA: Diagnosis not present

## 2023-05-25 LAB — COMPREHENSIVE METABOLIC PANEL
ALT: 9 U/L (ref 0–44)
AST: 13 U/L — ABNORMAL LOW (ref 15–41)
Albumin: 3.9 g/dL (ref 3.5–5.0)
Alkaline Phosphatase: 92 U/L (ref 38–126)
Anion gap: 10 (ref 5–15)
BUN: 17 mg/dL (ref 8–23)
CO2: 25 mmol/L (ref 22–32)
Calcium: 9.1 mg/dL (ref 8.9–10.3)
Chloride: 97 mmol/L — ABNORMAL LOW (ref 98–111)
Creatinine, Ser: 1.13 mg/dL — ABNORMAL HIGH (ref 0.44–1.00)
GFR, Estimated: 50 mL/min — ABNORMAL LOW (ref >60)
Glucose, Bld: 123 mg/dL — ABNORMAL HIGH (ref 70–99)
Potassium: 4.2 mmol/L (ref 3.5–5.1)
Sodium: 132 mmol/L — ABNORMAL LOW (ref 135–145)
Total Bilirubin: 1 mg/dL (ref <1.2)
Total Protein: 6.9 g/dL (ref 6.5–8.1)

## 2023-05-25 LAB — URINALYSIS, ROUTINE W REFLEX MICROSCOPIC
Bacteria, UA: NONE SEEN
Bilirubin Urine: NEGATIVE
Glucose, UA: NEGATIVE mg/dL
Ketones, ur: NEGATIVE mg/dL
Leukocytes,Ua: NEGATIVE
Nitrite: NEGATIVE
Protein, ur: NEGATIVE mg/dL
Specific Gravity, Urine: 1.034 — ABNORMAL HIGH (ref 1.005–1.030)
pH: 7 (ref 5.0–8.0)

## 2023-05-25 LAB — CBC
HCT: 37.9 % (ref 36.0–46.0)
Hemoglobin: 10.6 g/dL — ABNORMAL LOW (ref 12.0–15.0)
MCH: 22.7 pg — ABNORMAL LOW (ref 26.0–34.0)
MCHC: 28 g/dL — ABNORMAL LOW (ref 30.0–36.0)
MCV: 81.3 fL (ref 80.0–100.0)
Platelets: 324 10*3/uL (ref 150–400)
RBC: 4.66 MIL/uL (ref 3.87–5.11)
RDW: 18.4 % — ABNORMAL HIGH (ref 11.5–15.5)
WBC: 5.6 10*3/uL (ref 4.0–10.5)
nRBC: 0 % (ref 0.0–0.2)

## 2023-05-25 LAB — LIPASE, BLOOD: Lipase: 34 U/L (ref 11–51)

## 2023-05-25 MED ORDER — ONDANSETRON HCL 4 MG/2ML IJ SOLN
4.0000 mg | Freq: Once | INTRAMUSCULAR | Status: AC
  Administered 2023-05-25: 4 mg via INTRAVENOUS
  Filled 2023-05-25: qty 2

## 2023-05-25 MED ORDER — SODIUM CHLORIDE 0.9 % IV BOLUS
500.0000 mL | Freq: Once | INTRAVENOUS | Status: AC
  Administered 2023-05-25: 500 mL via INTRAVENOUS

## 2023-05-25 MED ORDER — LORAZEPAM 2 MG/ML IJ SOLN
0.5000 mg | Freq: Once | INTRAMUSCULAR | Status: AC
  Administered 2023-05-25: 0.5 mg via INTRAVENOUS
  Filled 2023-05-25: qty 1

## 2023-05-25 MED ORDER — HYDROMORPHONE HCL 1 MG/ML IJ SOLN
0.5000 mg | Freq: Once | INTRAMUSCULAR | Status: AC
Start: 2023-05-25 — End: 2023-05-25
  Administered 2023-05-25: 0.5 mg via INTRAVENOUS
  Filled 2023-05-25: qty 0.5

## 2023-05-25 MED ORDER — IOHEXOL 300 MG/ML  SOLN
100.0000 mL | Freq: Once | INTRAMUSCULAR | Status: AC | PRN
  Administered 2023-05-25: 100 mL via INTRAVENOUS

## 2023-05-25 NOTE — Discharge Instructions (Addendum)
Plan diet as tolerated, clear fluids.  Discontinue your iron supplement until you are seen by your GI provider as this may be contributing to your constipation.  Please keep your upcoming appointment with your GI provider.  Return to the emergency department for any new or worsening symptoms.

## 2023-05-25 NOTE — ED Notes (Signed)
See triage notes. Pt states has had to take stool softeners to have bm since xray dec 10. Had formed stool followed by loose stools this am. Pt c/o "hurts so bad" pain to left lower abd around into left back. Pt tender to right and left lower abd with palpation. Color wnl. Denies n/v. A/o.

## 2023-05-25 NOTE — ED Provider Notes (Signed)
[place] EMERGENCY DEPARTMENT AT [facility] Provider Note   CSN: [id number] Arrival date & time: 05/25/23  0940     History  Chief Complaint  Patient presents with   Abdominal Pain    Hailey [name] is a 77 y.o. female.   Abdominal Pain Associated symptoms: constipation and nausea   Associated symptoms: no chest pain, no chills, no dysuria, no fever and no shortness of breath        Hailey [name] is a 77 y.o. female with past medical history of paroxysmal atrial fibrillation, CHF, hypertension, hypothyroidism, type 2 diabetes, who presents to the Emergency Department complaining of persistent left lower quadrant pain.  States she has been having persistent pain of her left lower abdomen for 1 month.  Had CT abdomen and pelvis 1 month ago without significant findings.  She has been evaluated by urology and by her PCP for similar symptoms.  States she was treated with antibiotics for presumptive diverticulitis without improvement.  She is also been advised that she has some constipation.  She is taking stool softeners, but continues to have pain that radiates from her suprapubic area to her left flank pain.  Pain is constant.  Here for possible pain control.  Has appointment with GI provider next week.  Denies any fever or dysuria symptoms.  No pain numbness or weakness of her lower extremities.  She has chronic nausea and takes Zofran for this.  Home Medications Prior to Admission medications   Medication Sig Start Date End Date Taking? Authorizing Provider  acetaminophen (TYLENOL) 500 MG tablet Take 500-1,000 mg by mouth every 6 (six) hours as needed (pain.).    [provider]  benzonatate (TESSALON) 200 MG capsule Take 1 capsule (200 mg total) by mouth 2 (two) times daily as needed for cough. 05/13/23   [name], MD  Cholecalciferol (VITAMIN D3) 1000 UNITS CAPS Take 1,000 Units by mouth in the morning.    [provider]  docusate  sodium (COLACE) 100 MG capsule Take 1 capsule (100 mg total) by mouth 2 (two) times daily. 05/13/23   [name], MD  ELIQUIS 5 MG TABS tablet Take 5 mg by mouth 2 (two) times daily. 10/17/22   [provider]  ferrous gluconate (FERGON) 324 MG tablet Take 1 tablet (324 mg total) by mouth every other day. 04/18/23   [name], MD  fluticasone (FLONASE) 50 MCG/ACT nasal spray Place 2 sprays into both nostrils daily. 05/13/23   [name], MD  furosemide (LASIX) 80 MG tablet Take 40 mg by mouth in the morning and at bedtime. 1/2 tablet morning and night    [provider]  glucose blood (ONETOUCH ULTRA) test strip USE DAILY TO CHECK BLOOD SUGAR E11.8 11/04/22   Frederica Kuster, MD  Lancets Misc. (ONE TOUCH SURESOFT) MISC Use to test  Blood sugar once daily DX: E11.8, 06/14/19   Reed, Tiffany L, DO  levothyroxine (SYNTHROID) 25 MCG tablet Take 25 mcg by mouth daily before breakfast. 11/11/22 11/11/23  [provider]  magnesium oxide (MAG-OX) 400 (240 Mg) MG tablet Take 400 mg by mouth daily.    [provider]  metFORMIN (GLUCOPHAGE) 500 MG tablet Take 1 tablet (500 mg total) by mouth 2 (two) times daily with a meal. 05/14/23   [name], MD  methimazole (TAPAZOLE) 10 MG tablet TAKE ONE TABLET BY MOUTH EVERY DAY WITH FOOD 02/05/23   Croitoru, Mihai, MD  metoprolol tartrate (LOPRESSOR) 25 MG tablet  Take 1 tablet (25 mg total) by mouth 2 (two) times daily. 05/12/23   Croitoru, Mihai, MD  ondansetron (ZOFRAN) 4 MG tablet Take 1 tablet (4 mg total) by mouth every 8 (eight) hours as needed for nausea or vomiting. 02/18/23   [name], MD  pantoprazole (PROTONIX) 40 MG tablet TAKE ONE TABLET BY MOUTH EVERY DAY 04/02/23   [name], MD  potassium chloride SA (KLOR-CON M) 20 MEQ tablet TAKE ONE TABLET BY MOUTH TWICE DAILY 10/08/22   Croitoru, Mihai, MD  sildenafil (REVATIO) 20 MG tablet TAKE ONE TABLET BY MOUTH 3  TIMES DAILY 04/02/23   Croitoru, Mihai, MD  spironolactone (ALDACTONE) 25 MG tablet Take 1 tablet (25 mg total) by mouth daily. 07/26/22 07/21/23  Jodelle Gross, NP      Allergies    Codeine, Demerol hcl [meperidine], Ketorolac, Demerol, Multaq [dronedarone], Flexeril [cyclobenzaprine], Xarelto [rivaroxaban], and Latex    Review of Systems   Review of Systems  Constitutional:  Negative for appetite change, chills and fever.  Respiratory:  Negative for shortness of breath.   Cardiovascular:  Negative for chest pain.  Gastrointestinal:  Positive for abdominal pain, constipation and nausea.  Genitourinary:  Negative for dysuria and flank pain.  Musculoskeletal:  Negative for back pain.  Neurological:  Negative for dizziness, weakness, numbness and headaches.    Physical Exam Updated Vital Signs BP (!) 126/58   Pulse 62   Temp 98.1 F (36.7 C) (Oral)   Resp 18   SpO2 97%  Physical Exam Vitals and nursing note reviewed.  Constitutional:      General: She is not in acute distress.    Appearance: She is well-developed. She is not ill-appearing or toxic-appearing.  HENT:     Mouth/Throat:     Mouth: Mucous membranes are dry.  Cardiovascular:     Rate and Rhythm: Normal rate and regular rhythm.     Pulses: Normal pulses.  Pulmonary:     Effort: Pulmonary effort is normal.  Chest:     Chest wall: No tenderness.  Abdominal:     General: There is no distension.     Palpations: Abdomen is soft.     Tenderness: There is abdominal tenderness. There is no right CVA tenderness or left CVA tenderness.     Comments: Tenderness palpation left lower quadrant.  Patient seems exquisitely tender to this area.  Musculoskeletal:     Right lower leg: No edema.     Left lower leg: No edema.     Comments: Some tenderness to palpation midline low thoracic spine.  No bony step-offs.  No tenderness of the SI joint space.  Negative straight leg raise bilaterally.  Skin:    General: Skin is  warm.     Capillary Refill: Capillary refill takes less than 2 seconds.  Neurological:     General: No focal deficit present.     Mental Status: She is alert.     Sensory: No sensory deficit.     Motor: No weakness.     ED Results / Procedures / Treatments   Labs (all labs ordered are listed, but only abnormal results are displayed) Labs Reviewed  COMPREHENSIVE METABOLIC PANEL - Abnormal; Notable for the following components:      Result Value   Sodium 132 (*)    Chloride 97 (*)    Glucose, Bld 123 (*)    Creatinine, Ser 1.13 (*)    AST 13 (*)    GFR, Estimated 50 (*)  All other components within normal limits  CBC - Abnormal; Notable for the following components:   Hemoglobin 10.6 (*)    MCH 22.7 (*)    MCHC 28.0 (*)    RDW 18.4 (*)    All other components within normal limits  URINALYSIS, ROUTINE W REFLEX MICROSCOPIC - Abnormal; Notable for the following components:   Color, Urine STRAW (*)    Specific Gravity, Urine 1.034 (*)    Hgb urine dipstick SMALL (*)    All other components within normal limits  LIPASE, BLOOD    EKG None  Radiology CT ABDOMEN PELVIS W CONTRAST Result Date: 05/25/2023 CLINICAL DATA:  LLQ abdominal pain EXAM: CT ABDOMEN AND PELVIS WITH CONTRAST TECHNIQUE: Multidetector CT imaging of the abdomen and pelvis was performed using the standard protocol following bolus administration of intravenous contrast. RADIATION DOSE REDUCTION: This exam was performed according to the departmental dose-optimization program which includes automated exposure control, adjustment of the mA and/or kV according to patient size and/or use of iterative reconstruction technique. CONTRAST:  OMNIPAQUE IOHEXOL 300 MG/ML  SOLN COMPARISON:  05/13/2023 abdominal radiograph. 04/16/2023 CT abdomen/pelvis. FINDINGS: Lower chest: Solid 0.6 cm peripheral left lower lobe pulmonary nodule (series 3/image 6), stable since 09/19/2016 CT and considered benign. Trace posterior right  pleural effusion. Pacemaker leads are seen in the right atrium and right ventricle. Hepatobiliary: Normal liver size. No liver mass. Cholecystectomy. Bile ducts are stable and within normal post cholecystectomy limits with CBD diameter 7 mm. Pancreas: Normal, with no mass or duct dilation. Spleen: Normal size. No mass. Adrenals/Urinary Tract: Normal adrenals. Normal kidneys with no hydronephrosis and no renal mass. Normal bladder. Stomach/Bowel: Normal non-distended stomach. Normal caliber small bowel with no small bowel wall thickening. Normal appendix. Mild sigmoid diverticulosis with no large bowel wall thickening or significant pericolonic fat stranding. Vascular/Lymphatic: Atherosclerotic nonaneurysmal abdominal aorta. Patent portal, splenic and renal veins. No pathologically enlarged lymph nodes in the abdomen or pelvis. Reproductive: Status post hysterectomy, with no abnormal findings at the vaginal cuff. No adnexal mass. Other: No pneumoperitoneum, ascites or focal fluid collection. Musculoskeletal: No aggressive appearing focal osseous lesions. Moderate thoracolumbar spondylosis. IMPRESSION: 1. No acute abnormality. No evidence of bowel obstruction or acute bowel inflammation. Mild sigmoid diverticulosis, without evidence of acute diverticulitis. 2. Trace posterior right pleural effusion. 3.  Aortic Atherosclerosis (ICD10-I70.0). Electronically Signed   By: Delbert Phenix M.D.   On: 05/25/2023 11:44    Procedures Procedures    Medications Ordered in ED Medications  sodium chloride 0.9 % bolus 500 mL (0 mLs Intravenous Stopped 05/25/23 1220)  iohexol (OMNIPAQUE) 300 MG/ML solution 100 mL (100 mLs Intravenous Contrast Given 05/25/23 1115)  HYDROmorphone (DILAUDID) injection 0.5 mg (0.5 mg Intravenous Given 05/25/23 1219)  ondansetron (ZOFRAN) injection 4 mg (4 mg Intravenous Given 05/25/23 1221)  LORazepam (ATIVAN) injection 0.5 mg (0.5 mg Intravenous Given 05/25/23 1529)    ED Course/ Medical  Decision Making/ A&P                                 Medical Decision Making Patient here for evaluation of persistent left lower quadrant pain.  She has had pain of her left lower abdomen for 1 month.  She has been evaluated by urology and by PCP for this.  She has had CT of the abdomen and pelvis 1 month ago without clear source of patient's symptoms.  Is also been treated presumptively for diverticulitis  without improvement after antibiotics.  She is been advised that she may be constipated, she takes iron supplementation and taking over-the-counter stool softeners without improvement.  No fever or dysuria symptoms.  No pain numbness or weakness of her lower extremities.  Differential would include but not limited to diverticulitis, neoplasm, kidney stone, UTI, lumbar radiculopathy  Amount and/or Complexity of Data Reviewed Labs: ordered.    Details: Labs interpreted by me, no evidence of leukocytosis, hemoglobin 10.6.  Lipase unremarkable, chemistries without significant derangement.  Serum creatinine slightly elevated but near patient's baseline.  Urinalysis without evidence of infection  Radiology: ordered.    Details: CT of the abdomen and pelvis without acute abnormality.  No evidence of a bowel obstruction or inflammatory process. Discussion of management or test interpretation with external provider(s): Patient has been given IV fluids here along with antiemetic and pain medicine.  Pain is improved, but continues to have nausea.  On my recheck, patient actively having dry heaves without actual vomiting.  Will try Ativan and reassess  Patient also seen by Dr. Jeraldine Loots and care plan discussed.  After receiving Ativan, patient resting comfortably.  Nausea has resolved.  She has Zofran at home.  Patient will keep her upcoming appointment with GI.  Her hemoglobin today is 10.6.  Will have her discontinue her iron supplement until she is seen by GI as this may be exacerbating her constipation  symptoms.  She is agreeable to plan.  Bland diet as tolerated and strict ER return precautions were discussed.  She is agreeable and verbalized understanding.  Risk Prescription drug management.           Final Clinical Impression(s) / ED Diagnoses Final diagnoses:  Left lower quadrant abdominal pain    Rx / DC Orders ED Discharge Orders     None         Rosey Bath 05/25/23 1635    Gerhard Munch, MD 05/26/23 [phone]

## 2023-05-25 NOTE — ED Triage Notes (Signed)
Pt reports left sided abdominal pain. Pt states her PCP did an x-ray and reported she had fecal impaction. Pt reports worsening pain x 2 days. Denies fevers.

## 2023-05-25 NOTE — ED Notes (Signed)
Going to ct now

## 2023-05-26 ENCOUNTER — Telehealth: Payer: Medicare Other | Admitting: Orthopedic Surgery

## 2023-05-30 NOTE — Progress Notes (Unsigned)
Referring Provider:*** Primary Care Physician:  [name], MD Primary Gastroenterologist:  Dr. Marletta Lor  Chief Complaint  Patient presents with   Abdominal Pain    Lower left abdominal pain. Stomach hurts     HPI:   Hailey [name] is a 77 y.o. female presenting today at the request of [name], MD  for chronic abdominal pain.   Reviewed OV with PCP 05/13/23. Patient complaining of intermittent LLQ pain radiating to her back for the last few months. She previously had a CT 04/16/23 that showed no acute abnormalities, scattered stool, fatty liver, non-obstructing right renal stone. She was advised to start colace BID, miralax prn, obtain DG abdomen, blood work, and referred to GI. Underwent abdominal x ray 12/10 showing mild fecal retention. Recommended starting stool softeners daily.  Blood work showed a hemoglobin of 10.4 with iron deficiency.   Seen in the ER 12/22 for persistent left lower quadrant pain x 1 month.  Reported prior treatment for presumed diverticulitis without improvement.  Reported she was taking stool softeners for constipation, but continued to have pain radiating to suprapubic area and left flank.  Laboratory evaluation showed no acute abnormalities.  CT A/P with contrast with no acute abnormality.  Mild sigmoid diverticulosis without active diverticulitis.  She received IV fluids, antiemetics, pain control in the ER with clinical improvement. She was advised to keep upcoming appointment with GI and hold oral iron for now as this may be worsening constipation.  Today:  Had almost an impaction and PCP helped her get through it. Since this incident, she has had chronic LLQ abdominal pain radiating to her back. Started about 1 month ago.   Pain wakes her up at night. If she is walking, she feels well. Has taken tylenol intermittently. When she stands up from bed, the pain is so severe she almost passes out. Once she starts walking and moving around,  the pain eases. In pain all the time.   Taking sennakot prn. Taking MiraLAX once a day.  Bowels are not emptying well. Used to have diarrhea. Would take one imodium daily. Change in bowel habits came on suddenly in the last few months.   Has early satiety. Some worsening pain with meals. Maybe slight improvement with a bowel movement.   Has some nausea but no vomiting. Rare use of Zofran prn. This is chronic. No change recently. N  No brbpr or melena.  Dropped down to 111 lbs months ago when her husband was sick. Prior to GI problems. Has been gainig weight back.   No NSAIDs.   Has mild hypothyroidism. Taking synthroid.   No chronic history of anemia. Prior baseline in 12-13 range. Down to 11.05 April 2023. 10.4 on 12/10 and 10.6 on 12/22. Evidence of iron deficiency 04/15/23 with ferritin 11, iron 24, saturation 5%.   Prior EGD:  Prior Colonoscopy 03/21/2015:  1) One small sessile polyp was found in the rectosigmoid colon- ; polypectomy was performed using hot snare cautery. 2) A couple of small sigmoid diverticula. 3) Medium sized lipoma in the mid- right colon. 4) Moderate sized internal hemorrhoids. Pathology with tubular adenoma.     Past Medical History:  Diagnosis Date   Acute on chronic diastolic CHF (congestive heart failure), NYHA class 1 (HCC) 04/29/2013   Allergic rhinitis    Brady-tachy syndrome (HCC)    s/p STJ dual chamber PPM by Dr [name]   CAD (coronary artery disease)    70% RCA stenosis, treated medically   Control of  atrial fibrillation with pacemaker (HCC)    Diabetes mellitus (HCC)    type 2   DJD (degenerative joint disease)    Dyspnea    occasional   Hearing loss    Right ear only - no hearing aids   Hypertension    Hyperthyroidism    Paroxysmal atrial fibrillation (HCC)    Pacemaker St Jude   Pneumonia    years ago- before 2012 per patient   Polio    as a child   PONV (postoperative nausea and vomiting)    Presence of permanent cardiac  pacemaker    St Jude pacemaker   Pulmonary disease    Sleep apnea    uses  CPAP nightly    Past Surgical History:  Procedure Laterality Date   arm surgery d/t fx Right 1996   ATRIAL FIBRILLATION ABLATION  07/31/2012; 08-10-13   PVI by Dr [name]   ATRIAL FIBRILLATION ABLATION N/A 07/31/2012   Procedure: ATRIAL FIBRILLATION ABLATION;  Surgeon: Hillis Range, MD;  Location[facility] CATH LAB;  Service: Cardiovascular;  Laterality: N/A;   ATRIAL FIBRILLATION ABLATION N/A 08/10/2013   Procedure: ATRIAL FIBRILLATION ABLATION;  Surgeon: Gardiner Rhyme, MD;  Location: [facility] CATH LAB;  Service: Cardiovascular;  Laterality: N/A;   BACK SURGERY  07/26/2012   dR. [facility]   CARDIAC CATHETERIZATION  12/03/2010   single vessel mid RCA   CARDIAC ELECTROPHYSIOLOGY MAPPING AND ABLATION  07/31/2012   Dr. Hillis Range   CESAREAN SECTION  09/20/1974   Dr. Greta Doom   CHOLECYSTECTOMY  1994   COLONOSCOPY WITH PROPOFOL N/A 03/21/2015   Procedure: COLONOSCOPY WITH PROPOFOL;  Surgeon: Charna Elizabeth, MD;  Location: [facility] ENDOSCOPY;  Service: Endoscopy;  Laterality: N/A;   DIAGNOSTIC MAMMOGRAM  04/16/2017   Tylene Fantasia. Morris, DO   EYE SURGERY Bilateral    remove cataracts   LAPAROSCOPIC HYSTERECTOMY  Late 63s to early 109s   LEFT HEART CATH AND CORONARY ANGIOGRAPHY N/A 07/30/2017   Procedure: LEFT HEART CATH AND CORONARY ANGIOGRAPHY;  Surgeon: Lennette Bihari, MD;  Location: [facility] INVASIVE CV LAB;  Service: Cardiovascular;  Laterality: N/A;   LUMBAR LAMINECTOMY/DECOMPRESSION MICRODISCECTOMY Right 12/23/2012   Procedure: Right L5-S1 Laminotomy for resection of synovial cyst;  Surgeon: [name], MD;  Location: [facility] NEURO ORS;  Service: Neurosurgery;  Laterality: Right;  Right Lumbar five-sacral one Laminotomy for resection of synovial cyst   NM MYOCAR PERF WALL MOTION  12/20/2010   normal   PACEMAKER IMPLANT N/A 08/09/2019   Procedure: PACEMAKER IMPLANT;  Surgeon: [name], MD;  Location: [facility] INVASIVE CV LAB;  Service:  Cardiovascular;  Laterality: N/A;   PACEMAKER INSERTION  12/03/2010   SJM Accent DR RF implanted by Dr [name]   RIGHT HEART CATH N/A 04/22/2018   Procedure: RIGHT HEART CATH;  Surgeon: Iran Ouch, MD;  Location: [facility] INVASIVE CV LAB;  Service: Cardiovascular;  Laterality: N/A;   RIGHT HEART CATH N/A 07/09/2022   Procedure: RIGHT HEART CATH;  Surgeon: Dolores Patty, MD;  Location: [facility] INVASIVE CV LAB;  Service: Cardiovascular;  Laterality: N/A;   TEE WITHOUT CARDIOVERSION N/A 07/30/2012   Procedure: TRANSESOPHAGEAL ECHOCARDIOGRAM (TEE);  Surgeon: [name], MD;  Location: [facility] ENDOSCOPY;  Service: Cardiovascular;  Laterality: N/A;  h&p in file-hope   TEE WITHOUT CARDIOVERSION N/A 08/09/2013   Procedure: TRANSESOPHAGEAL ECHOCARDIOGRAM (TEE);  Surgeon: Lewayne Bunting, MD;  Location: [facility] ENDOSCOPY;  Service: Cardiovascular;  Laterality: N/A;   THYROID LOBECTOMY Right 09/20/2022   Procedure: RIGHT HEMITHYROIDECTOMY;  Surgeon: Laren Boom, DO;  Location: [facility] OR;  Service: ENT;  Laterality: Right;   TOTAL ABDOMINAL HYSTERECTOMY  1981   ULTRASOUND GUIDANCE FOR VASCULAR ACCESS  04/22/2018   Procedure: Ultrasound Guidance For Vascular Access;  Surgeon: Iran Ouch, MD;  Location: [facility] INVASIVE CV LAB;  Service: Cardiovascular;;    Current Outpatient Medications  Medication Sig Dispense Refill   acetaminophen (TYLENOL) 500 MG tablet Take 500-1,000 mg by mouth every 6 (six) hours as needed (pain.).     benzonatate (TESSALON) 200 MG capsule Take 1 capsule (200 mg total) by mouth 2 (two) times daily as needed for cough. 20 capsule 0   Cholecalciferol (VITAMIN D3) 1000 UNITS CAPS Take 1,000 Units by mouth in the morning.     docusate sodium (COLACE) 100 MG capsule Take 1 capsule (100 mg total) by mouth 2 (two) times daily. 60 capsule 3   ELIQUIS 5 MG TABS tablet Take 5 mg by mouth 2 (two) times daily.     ferrous gluconate (FERGON) 324 MG tablet Take 1 tablet (324 mg total) by  mouth every other day. 90 tablet 3   fluticasone (FLONASE) 50 MCG/ACT nasal spray Place 2 sprays into both nostrils daily. 16 g 6   furosemide (LASIX) 80 MG tablet Take 40 mg by mouth in the morning and at bedtime. 1/2 tablet morning and night     glucose blood (ONETOUCH ULTRA) test strip USE DAILY TO CHECK BLOOD SUGAR E11.8 100 each 11   Lancets Misc. (ONE TOUCH SURESOFT) MISC Use to test  Blood sugar once daily DX: E11.8, 1 each 0   levothyroxine (SYNTHROID) 25 MCG tablet Take 25 mcg by mouth daily before breakfast.     magnesium oxide (MAG-OX) 400 (240 Mg) MG tablet Take 400 mg by mouth daily.     methimazole (TAPAZOLE) 10 MG tablet TAKE ONE TABLET BY MOUTH EVERY DAY WITH FOOD 90 tablet 1   metoprolol tartrate (LOPRESSOR) 25 MG tablet Take 1 tablet (25 mg total) by mouth 2 (two) times daily. 180 tablet 3   ondansetron (ZOFRAN) 4 MG tablet Take 1 tablet (4 mg total) by mouth every 8 (eight) hours as needed for nausea or vomiting. 20 tablet 0   pantoprazole (PROTONIX) 40 MG tablet TAKE ONE TABLET BY MOUTH EVERY DAY 90 tablet 1   potassium chloride SA (KLOR-CON M) 20 MEQ tablet TAKE ONE TABLET BY MOUTH TWICE DAILY 180 tablet 2   sildenafil (REVATIO) 20 MG tablet TAKE ONE TABLET BY MOUTH 3 TIMES DAILY 90 tablet 6   spironolactone (ALDACTONE) 25 MG tablet Take 1 tablet (25 mg total) by mouth daily. 90 tablet 3   metFORMIN (GLUCOPHAGE) 500 MG tablet Take 1 tablet (500 mg total) by mouth 2 (two) times daily with a meal. 180 tablet 3   No current facility-administered medications for this visit.    Allergies as of 06/02/2023 - Review Complete 06/02/2023  Allergen Reaction Noted   Codeine Nausea And Vomiting and Nausea Only 01/29/2011   Demerol hcl [meperidine] Nausea Only    Ketorolac Nausea And Vomiting, Nausea Only, and Other (See Comments) 01/29/2011   Demerol Nausea And Vomiting 01/29/2011   Multaq [dronedarone] Other (See Comments) 08/05/2013   Flexeril [cyclobenzaprine] Nausea And Vomiting  02/20/2017   Xarelto [rivaroxaban] Diarrhea 03/08/2023   Latex Rash 09/20/2022    Family History  Problem Relation Age of Onset   Cancer Brother    Heart attack Sister    Breast cancer Sister  Diabetes Sister    Heart disease Sister    Breast cancer Sister    Dementia Sister    Colon cancer Sister    Heart disease Sister    Breast cancer Sister    Suicidality Brother    Prostate cancer Brother    Dementia Brother     Social History   Socioeconomic History   Marital status: Married    Spouse name: Loistine Chance   Number of children: 3   Years of education: 13   Highest education level: Some college, no degree  Occupational History   Occupation: housewife  Tobacco Use   Smoking status: Never   Smokeless tobacco: Never  Vaping Use   Vaping status: Never Used  Substance and Sexual Activity   Alcohol use: No   Drug use: No   Sexual activity: Not Currently    Birth control/protection: Abstinence, Surgical    Comment: Hysterectomy  Other Topics Concern   Not on file  Social History Narrative   Lives in [place].  Retired.  Was office clerk in several companies.  Did some technical school after completing high school.  She has no pets.  She and her husband live in a one-story home.  They've been married since 1965.  She follows a heart healthy diet.  She has a living will and hcpoa but does not have a DNR form.  She does struggle to prepare food.     Social Drivers of Corporate investment banker Strain: Not on file  Food Insecurity: Low Risk  (11/11/2022)   [facility]   Hunger Vital Sign    Worried About Running Out of Food in the Last Year: Never true    Ran Out of Food in the Last Year: Never true  Transportation Needs: No Transportation Needs (09/20/2022)   PRAPARE - Administrator, Civil Service (Medical): No    Lack of Transportation (Non-Medical): No  Physical Activity: Not on file  Stress: Not on file  Social  Connections: Not on file  Intimate Partner Violence: Not At Risk (09/20/2022)   Humiliation, Afraid, Rape, and Kick questionnaire    Fear of Current or Ex-Partner: No    Emotionally Abused: No    Physically Abused: No    Sexually Abused: No    Review of Systems: Gen: Denies any fever, chills, fatigue, weight loss, lack of appetite.  CV: Denies chest pain, heart palpitations, peripheral edema, syncope.  Resp: Denies shortness of breath at rest or with exertion. Denies wheezing or cough.  GI: Denies dysphagia or odynophagia. Denies jaundice, hematemesis, fecal incontinence. GU : Denies urinary burning, urinary frequency, urinary hesitancy MS: Denies joint pain, muscle weakness, cramps, or limitation of movement.  Derm: Denies rash, itching, dry skin Psych: Denies depression, anxiety, memory loss, and confusion Heme: Denies bruising, bleeding, and enlarged lymph nodes.  Physical Exam: BP 116/73 (BP Location: Right Arm, Patient Position: Sitting, Cuff Size: Normal)   Pulse 76   Temp (!) 96.9 F (36.1 C) (Temporal)   Ht 4\' 11"  (1.499 m)   Wt 120 lb 9.6 oz (54.7 kg)   BMI 24.36 kg/m  General:   Alert and oriented. Pleasant and cooperative. Well-nourished and well-developed.  Head:  Normocephalic and atraumatic. Eyes:  Without icterus, sclera clear and conjunctiva pink.  Ears:  Normal auditory acuity. Lungs:  Clear to auscultation bilaterally. No wheezes, rales, or rhonchi. No distress.  Heart:  S1, S2 present without murmurs appreciated.  Abdomen:  +  BS, soft, non-tender and non-distended. No HSM noted. No guarding or rebound. No masses appreciated.  Rectal:  Deferred  Msk:  Symmetrical without gross deformities. Normal posture. Extremities:  Without edema. Neurologic:  Alert and  oriented x4;  grossly normal neurologically. Skin:  Intact without significant lesions or rashes. Psych:  Alert and cooperative. Normal mood and affect.    Assessment:     Plan:  ***   Ermalinda Memos, PA-C [facility] Gastroenterology 06/02/2023

## 2023-05-30 NOTE — H&P (View-Only) (Signed)
Referring Provider:Veludandi, Elvis Coil, MD Primary Care Physician:  [name], MD Primary Gastroenterologist:  Dr. Marletta Lor  Chief Complaint  Patient presents with   Abdominal Pain    Lower left abdominal pain. Stomach hurts     HPI:   Hailey [name] is a 77 y.o. female presenting today at the request of [name], MD  for chronic abdominal pain.   Reviewed OV with PCP 05/13/23. Patient complaining of intermittent LLQ pain radiating to her back for the last few months. She previously had a CT 04/16/23 that showed no acute abnormalities, scattered stool, fatty liver, non-obstructing right renal stone. She was advised to start colace BID, miralax prn, obtain DG abdomen, blood work, and referred to GI. Underwent abdominal x ray 12/10 showing mild fecal retention. Recommended starting stool softeners daily.  Blood work showed a hemoglobin of 10.4 with iron deficiency.   Seen in the ER 12/22 for persistent left lower quadrant pain x 1 month.  Reported prior treatment for presumed diverticulitis without improvement.  Reported she was taking stool softeners for constipation, but continued to have pain radiating to suprapubic area and left flank.  Laboratory evaluation showed no acute abnormalities.  CT A/P with contrast with no acute abnormality.  Mild sigmoid diverticulosis without active diverticulitis.  She received IV fluids, antiemetics, pain control in the ER with clinical improvement. She was advised to keep upcoming appointment with GI and hold oral iron for now as this may be worsening constipation.  Today: Reports trouble with LLQ abdominal pain radiating to her back that started about 1 month ago. States everything started when she almost had a stool impaction. States her PCP helped her get through it, but since that indecent, she has been left with pain.   Pain wakes her up at night. If she is walking, she feels well. Has taken tylenol intermittently. When she  stands up from bed, the pain is so severe she almost passes out. Once she starts walking and moving around, the pain eases, but continues with some degree of pain, its just tolerable. States the pain never actually goes away. Some worsening pain with meals. Maybe slight improvement with a bowel movement. Taking sennakot prn,  MiraLAX once a day.  Bowels are not emptying well. Used to have diarrhea and would take one imodium daily. Change in bowel habits came on suddenly in the last few months.    Has early satiety. Some nausea but no vomiting. Rare use of Zofran prn. This is chronic. No change recently. No GERD symptoms on pantoprazole 40 mg daily.    No brbpr or melena.  Dropped down to 111 lbs months ago when her husband was sick. This was prior to GI problems. Has been gainig weight back since that time.    No NSAIDs.   Has mild hypothyroidism. Taking synthroid.   No chronic history of anemia. Prior baseline in 12-13 range. Down to 11.05 April 2023. 10.4 on 12/10 and 10.6 on 12/22. Evidence of iron deficiency 04/15/23 with ferritin 11, iron 24, saturation 5%.   Prior EGD: No Prior Colonoscopy 03/21/2015:  1) One small sessile polyp was found in the rectosigmoid colon- ; polypectomy was performed using hot snare cautery. 2) A couple of small sigmoid diverticula. 3) Medium sized lipoma in the mid- right colon. 4) Moderate sized internal hemorrhoids. Pathology with tubular adenoma.     Past Medical History:  Diagnosis Date   Acute on chronic diastolic CHF (congestive heart failure), NYHA class 1 (HCC) 04/29/2013  Allergic rhinitis    Brady-tachy syndrome (HCC)    s/p STJ dual chamber PPM by Dr [name]   CAD (coronary artery disease)    70% RCA stenosis, treated medically   Control of atrial fibrillation with pacemaker (HCC)    Diabetes mellitus (HCC)    type 2   DJD (degenerative joint disease)    Dyspnea    occasional   Hearing loss    Right ear only - no hearing aids    Hypertension    Hyperthyroidism    Paroxysmal atrial fibrillation (HCC)    Pacemaker St Jude   Pneumonia    years ago- before 2012 per patient   Polio    as a child   PONV (postoperative nausea and vomiting)    Presence of permanent cardiac pacemaker    St Jude pacemaker   Pulmonary disease    Sleep apnea    uses  CPAP nightly    Past Surgical History:  Procedure Laterality Date   arm surgery d/t fx Right 1996   ATRIAL FIBRILLATION ABLATION  07/31/2012; 08-10-13   PVI by Dr [name]   ATRIAL FIBRILLATION ABLATION N/A 07/31/2012   Procedure: ATRIAL FIBRILLATION ABLATION;  Surgeon: Hillis Range, MD;  Location: [facility] CATH LAB;  Service: Cardiovascular;  Laterality: N/A;   ATRIAL FIBRILLATION ABLATION N/A 08/10/2013   Procedure: ATRIAL FIBRILLATION ABLATION;  Surgeon: Gardiner Rhyme, MD;  Location: [facility] CATH LAB;  Service: Cardiovascular;  Laterality: N/A;   BACK SURGERY  07/26/2012   dR. [facility]   CARDIAC CATHETERIZATION  12/03/2010   single vessel mid RCA   CARDIAC ELECTROPHYSIOLOGY MAPPING AND ABLATION  07/31/2012   Dr. Hillis Range   CESAREAN SECTION  09/20/1974   Dr. Greta Doom   CHOLECYSTECTOMY  1994   COLONOSCOPY WITH PROPOFOL N/A 03/21/2015   Procedure: COLONOSCOPY WITH PROPOFOL;  Surgeon: Charna Elizabeth, MD;  Location: [facility] ENDOSCOPY;  Service: Endoscopy;  Laterality: N/A;   DIAGNOSTIC MAMMOGRAM  04/16/2017   Tylene Fantasia. Morris, DO   EYE SURGERY Bilateral    remove cataracts   LAPAROSCOPIC HYSTERECTOMY  Late 70s to early 47s   LEFT HEART CATH AND CORONARY ANGIOGRAPHY N/A 07/30/2017   Procedure: LEFT HEART CATH AND CORONARY ANGIOGRAPHY;  Surgeon: Lennette Bihari, MD;  Location: [facility] INVASIVE CV LAB;  Service: Cardiovascular;  Laterality: N/A;   LUMBAR LAMINECTOMY/DECOMPRESSION MICRODISCECTOMY Right 12/23/2012   Procedure: Right L5-S1 Laminotomy for resection of synovial cyst;  Surgeon: [name], MD;  Location: [facility] NEURO ORS;  Service: Neurosurgery;  Laterality: Right;  Right Lumbar  five-sacral one Laminotomy for resection of synovial cyst   NM MYOCAR PERF WALL MOTION  12/20/2010   normal   PACEMAKER IMPLANT N/A 08/09/2019   Procedure: PACEMAKER IMPLANT;  Surgeon: [name], MD;  Location: [facility] INVASIVE CV LAB;  Service: Cardiovascular;  Laterality: N/A;   PACEMAKER INSERTION  12/03/2010   SJM Accent DR RF implanted by Dr [name]   RIGHT HEART CATH N/A 04/22/2018   Procedure: RIGHT HEART CATH;  Surgeon: Iran Ouch, MD;  Location: [facility] INVASIVE CV LAB;  Service: Cardiovascular;  Laterality: N/A;   RIGHT HEART CATH N/A 07/09/2022   Procedure: RIGHT HEART CATH;  Surgeon: Dolores Patty, MD;  Location: [facility] INVASIVE CV LAB;  Service: Cardiovascular;  Laterality: N/A;   TEE WITHOUT CARDIOVERSION N/A 07/30/2012   Procedure: TRANSESOPHAGEAL ECHOCARDIOGRAM (TEE);  Surgeon: [name], MD;  Location: [facility] ENDOSCOPY;  Service: Cardiovascular;  Laterality: N/A;  h&p in file-hope   TEE WITHOUT  CARDIOVERSION N/A 08/09/2013   Procedure: TRANSESOPHAGEAL ECHOCARDIOGRAM (TEE);  Surgeon: Lewayne Bunting, MD;  Location: [facility] ENDOSCOPY;  Service: Cardiovascular;  Laterality: N/A;   THYROID LOBECTOMY Right 09/20/2022   Procedure: RIGHT HEMITHYROIDECTOMY;  Surgeon: Laren Boom, DO;  Location: [facility] OR;  Service: ENT;  Laterality: Right;   TOTAL ABDOMINAL HYSTERECTOMY  1981   ULTRASOUND GUIDANCE FOR VASCULAR ACCESS  04/22/2018   Procedure: Ultrasound Guidance For Vascular Access;  Surgeon: Iran Ouch, MD;  Location: [facility] INVASIVE CV LAB;  Service: Cardiovascular;;    Current Outpatient Medications  Medication Sig Dispense Refill   acetaminophen (TYLENOL) 500 MG tablet Take 500-1,000 mg by mouth every 6 (six) hours as needed (pain.).     Cholecalciferol (VITAMIN D3) 1000 UNITS CAPS Take 1,000 Units by mouth in the morning.     ELIQUIS 5 MG TABS tablet Take 5 mg by mouth 2 (two) times daily.     fluticasone (FLONASE) 50 MCG/ACT nasal spray Place 2 sprays into both  nostrils daily. 16 g 6   furosemide (LASIX) 80 MG tablet Take 40 mg by mouth in the morning and at bedtime. 1/2 tablet morning and night     glucose blood (ONETOUCH ULTRA) test strip USE DAILY TO CHECK BLOOD SUGAR E11.8 100 each 11   Lancets Misc. (ONE TOUCH SURESOFT) MISC Use to test  Blood sugar once daily DX: E11.8, 1 each 0   levothyroxine (SYNTHROID) 25 MCG tablet Take 25 mcg by mouth daily before breakfast.     magnesium oxide (MAG-OX) 400 (240 Mg) MG tablet Take 400 mg by mouth daily.     methimazole (TAPAZOLE) 10 MG tablet TAKE ONE TABLET BY MOUTH EVERY DAY WITH FOOD 90 tablet 1   ondansetron (ZOFRAN) 4 MG tablet Take 1 tablet (4 mg total) by mouth every 8 (eight) hours as needed for nausea or vomiting. 20 tablet 0   pantoprazole (PROTONIX) 40 MG tablet TAKE ONE TABLET BY MOUTH EVERY DAY 90 tablet 1   potassium chloride SA (KLOR-CON M) 20 MEQ tablet TAKE ONE TABLET BY MOUTH TWICE DAILY 180 tablet 2   sildenafil (REVATIO) 20 MG tablet TAKE ONE TABLET BY MOUTH 3 TIMES DAILY 90 tablet 6   spironolactone (ALDACTONE) 25 MG tablet Take 1 tablet (25 mg total) by mouth daily. 90 tablet 3   metoprolol tartrate (LOPRESSOR) 25 MG tablet Take 12.5 mg by mouth in the morning and at bedtime.     No current facility-administered medications for this visit.    Allergies as of 06/02/2023 - Review Complete 06/02/2023  Allergen Reaction Noted   Codeine Nausea And Vomiting and Nausea Only 01/29/2011   Demerol hcl [meperidine] Nausea Only    Ketorolac Nausea And Vomiting, Nausea Only, and Other (See Comments) 01/29/2011   Demerol Nausea And Vomiting 01/29/2011   Multaq [dronedarone] Other (See Comments) 08/05/2013   Flexeril [cyclobenzaprine] Nausea And Vomiting 02/20/2017   Xarelto [rivaroxaban] Diarrhea 03/08/2023   Latex Rash 09/20/2022    Family History  Problem Relation Age of Onset   Cancer Brother    Heart attack Sister    Breast cancer Sister    Diabetes Sister    Heart disease Sister     Breast cancer Sister    Dementia Sister    Colon cancer Sister    Heart disease Sister    Breast cancer Sister    Suicidality Brother    Prostate cancer Brother    Dementia Brother     Social History  Socioeconomic History   Marital status: Married    Spouse name: Loistine Chance   Number of children: 3   Years of education: 13   Highest education level: Some college, no degree  Occupational History   Occupation: housewife  Tobacco Use   Smoking status: Never   Smokeless tobacco: Never  Vaping Use   Vaping status: Never Used  Substance and Sexual Activity   Alcohol use: No   Drug use: No   Sexual activity: Not Currently    Birth control/protection: Abstinence, Surgical    Comment: Hysterectomy  Other Topics Concern   Not on file  Social History Narrative   Lives in [place].  Retired.  Was office clerk in several companies.  Did some technical school after completing high school.  She has no pets.  She and her husband live in a one-story home.  They've been married since 1965.  She follows a heart healthy diet.  She has a living will and hcpoa but does not have a DNR form.  She does struggle to prepare food.     Social Drivers of Corporate investment banker Strain: Not on file  Food Insecurity: Low Risk  (11/11/2022)   [facility]   Hunger Vital Sign    Worried About Running Out of Food in the Last Year: Never true    Ran Out of Food in the Last Year: Never true  Transportation Needs: No Transportation Needs (09/20/2022)   PRAPARE - Administrator, Civil Service (Medical): No    Lack of Transportation (Non-Medical): No  Physical Activity: Not on file  Stress: Not on file  Social Connections: Not on file  Intimate Partner Violence: Not At Risk (09/20/2022)   Humiliation, Afraid, Rape, and Kick questionnaire    Fear of Current or Ex-Partner: No    Emotionally Abused: No    Physically Abused: No    Sexually Abused: No     Review of Systems: Gen: Denies any fever, chills, cold or flu like symptoms.  CV: Denies chest pain, heart palpitations. Resp: Denies shortness of breath, cough.  GI: See HPI GU : Denies urinary burning, urinary frequency, urinary hesitancy MS: Denies joint pain. Derm: Denies rash.  Psych: Denies depression, anxiety.  Heme: See HPI.   Physical Exam: BP 116/73 (BP Location: Right Arm, Patient Position: Sitting, Cuff Size: Normal)   Pulse 76   Temp (!) 96.9 F (36.1 C) (Temporal)   Ht 4\' 11"  (1.499 m)   Wt 120 lb 9.6 oz (54.7 kg)   BMI 24.36 kg/m  General:   Alert and oriented. Pleasant and cooperative. Well-nourished and well-developed.  Head:  Normocephalic and atraumatic. Eyes:  Without icterus, sclera clear and conjunctiva pink.  Ears:  Normal auditory acuity. Lungs:  Clear to auscultation bilaterally. No wheezes, rales, or rhonchi. No distress.  Heart:  S1, S2 present without murmurs appreciated.  Abdomen:  +BS, soft, and non-distended. Mild to moderate TTP in LLQ but patient reports no worsening since last CT.  No HSM noted. No guarding or rebound. No masses appreciated.  Rectal:  Deferred  Back: Left sided low back paraspinal tenderness. No swelling, erythema, overlaying skin changes.  Msk:  Symmetrical without gross deformities. Normal posture. Extremities:  Without edema. Neurologic:  Alert and  oriented x4;  grossly normal neurologically. Skin:  Intact without significant lesions or rashes. Psych:   Normal mood and affect.     Assessment:  77 y.o. female with history  of CHF, pulmonary artery HTN, a fib on Eliquis, sick sinus syndrome s/p dual chamber pacemaker, CAD, diabetes, HTN, hypothyroidism s/p hemithyroidectomy and now hypothyroidism, presenting today for further evaluation of abdominal pain, change in bowel habits.  Also discussed IDA.   LLQ abdominal pain/back pain/change in bowel habits: 1 month of constant LLQ abdominal pain that radiates to her back  with symptoms most severe at night, waking her from sleep.  Interestingly, symptoms improved by walking, but never resolved.  Sometimes worsened by meals, but not consistently.  Slight improvement with a bowel movement, but no resolution.  Also change in bowel habits in the last couple of months from diarrhea to constipation.  No BRBPR or melena.  Reports weight loss several months ago, but has been gaining weight back recently.  Prior workup in the ER on 12/22.  CT with no acute abnormality, mild sigmoid diverticulosis without acute diverticulitis.  Query whether constipation is contributing to LLQ pain though I am not sure why she has had such significant change in her bowel habits.  This is concerning especially in the setting of new onset anemia as discussed below.  Her last colonoscopy was in 2016 with 1 tubular adenoma removed.   I have recommended starting Linzess 145 mcg daily to help with constipation and to proceed with a colonoscopy for further evaluation.  IDA: No chronic history of anemia. Prior baseline in 12-13 range. Down to 11.05 April 2023. 10.4 on 12/10 and 10.6 on 12/22. Evidence of iron deficiency 04/15/23 with ferritin 11, iron 24, saturation 5%.  Has been started on iron recently, but stopped this as it was concerned this may be worsening her abdominal pain. Denies overt GI bleeding or NSAID use.   Etiology is unclear. In the setting of LLQ abdominal pain, significant change in bowel habits from diarrhea to constipation, and colonoscopy 9 years ago, I am concerned about underlying malignancy.  She does have early satiety and chronic nausea using Zofran as needed, but reports no change in her symptoms recently.  Considering his symptoms, could also have PUD, gastritis, duodenitis, H. pylori contributing to IDA.  Recommended updating colonoscopy and EGD.  Chronic nausea/early satiety: Manages with Zofran as needed which is rare. On daily PPI without GERD symptoms.  Previously lost  weight in the setting of her husband's illness, but reports she has been gaining weight recently.  Differentials include gastritis, duodenitis, PUD, gastroparesis, H. pylori, other gastric outlet obstruction.  I recommended EGD for further evaluation.  Recent CT A/P with contrast 12/22 with no acute findings.  History of cholecystectomy.  LFTs recently normal.    Plan:  Proceed with upper endoscopy with propofol by Dr. Marletta Lor in near future. The risks, benefits, and alternatives have been discussed with the patient in detail. The patient states understanding and desires to proceed.  ASA 3 We will get medical clearance as well as the okay to hold Eliquis for 2 days prior to procedures from cardiology. Hold iron x 10 days prior to procedure. No morning diabetes medications day of procedures. Otherwise, may resume iron for now as abdominal pain and bowel habit changes were present prior to starting this. Stop MiraLAX and Senokot. Start Linzess 145 mcg daily. Continue daily pantoprazole. Follow-up after procedures.   Ermalinda Memos, PA-C [facility] 06/02/2023

## 2023-06-02 ENCOUNTER — Encounter: Payer: Self-pay | Admitting: Gastroenterology

## 2023-06-02 ENCOUNTER — Telehealth: Payer: Self-pay | Admitting: *Deleted

## 2023-06-02 ENCOUNTER — Ambulatory Visit: Payer: Medicare Other | Admitting: Gastroenterology

## 2023-06-02 VITALS — BP 116/73 | HR 76 | Temp 96.9°F | Ht 59.0 in | Wt 120.6 lb

## 2023-06-02 DIAGNOSIS — R110 Nausea: Secondary | ICD-10-CM

## 2023-06-02 DIAGNOSIS — R1032 Left lower quadrant pain: Secondary | ICD-10-CM

## 2023-06-02 DIAGNOSIS — K5900 Constipation, unspecified: Secondary | ICD-10-CM

## 2023-06-02 DIAGNOSIS — R6881 Early satiety: Secondary | ICD-10-CM | POA: Diagnosis not present

## 2023-06-02 DIAGNOSIS — R194 Change in bowel habit: Secondary | ICD-10-CM | POA: Diagnosis not present

## 2023-06-02 DIAGNOSIS — D509 Iron deficiency anemia, unspecified: Secondary | ICD-10-CM | POA: Diagnosis not present

## 2023-06-02 DIAGNOSIS — M545 Low back pain, unspecified: Secondary | ICD-10-CM | POA: Diagnosis not present

## 2023-06-02 NOTE — Patient Instructions (Addendum)
We will get you scheduled for an upper endoscopy and colonoscopy in the near future with Dr. Marletta Lor at [facility]. We will reach out to your cardiologist to get medical clearance as well as the approval to hold Eliquis for 48 hours prior to your procedures. You will also need to hold iron for 10 days prior to your procedure.  Otherwise, you may resume iron every other day.  For constipation, start Linzess 145 mcg daily, first thing in the morning, 30 minutes before a meal.  We are providing you with samples.  Please call in 1 week with a progress report.  If this works well, I will send in a prescription to your pharmacy.  As we discussed, Linzess can cause diarrhea.  Do not stop Linzess, continue taking this every day and diarrhea should taper off.  When you start Linzess, stop MiraLAX and Senokot.  I will see you back in the office after your procedures.  Ermalinda Memos, PA-C [facility]

## 2023-06-02 NOTE — Telephone Encounter (Signed)
  Request for patient to stop medication prior to procedure or is needing cleareance  06/02/23  Hailey [name] [birth year]  What type of surgery is being performed? Colonscopy/Esophagogastroduodenoscopy (EGD)  When is surgery scheduled? TBD  What type of clearance is required (medical or pharmacy to hold medication or both? Both  Patient is needing to have medical clearance and clearance to hold Eliquis x 2 days.  Name of physician performing surgery?  Dr.Carver [facility] at [employer]: [phone] Fax: [phone]  Anethesia type (none, local, MAC, general)? MAC

## 2023-06-03 NOTE — Telephone Encounter (Signed)
 Patient with diagnosis of atrial fibrillation on Eliquis  for anticoagulation.    What type of surgery is being performed? Colonscopy/Esophagogastroduodenoscopy (EGD)   When is surgery scheduled? TBD   CHA2DS2-VASc Score = 7   This indicates a 11.2% annual risk of stroke. The patient's score is based upon: CHF History: 1 HTN History: 1 Diabetes History: 1 Stroke History: 0 Vascular Disease History: 1 Age Score: 2 Gender Score: 1    CrCl 35 Platelet count 324  Per office protocol, patient can hold Eliquis  for 2 days prior to procedure.   Patient will not need bridging with Lovenox (enoxaparin) around procedure.  **This guidance is not considered finalized until pre-operative APP has relayed final recommendations.**

## 2023-06-05 ENCOUNTER — Encounter: Payer: Self-pay | Admitting: *Deleted

## 2023-06-05 ENCOUNTER — Other Ambulatory Visit: Payer: Self-pay | Admitting: *Deleted

## 2023-06-05 MED ORDER — PEG 3350-KCL-NA BICARB-NACL 420 G PO SOLR: 4000.0000 mL | Freq: Once | ORAL | 0 refills | Status: AC

## 2023-06-05 NOTE — Telephone Encounter (Signed)
   Primary Cardiologist: Jerel Balding, MD  Chart reviewed as part of pre-operative protocol coverage. Given past medical history and time since last visit, based on ACC/AHA guidelines, Hailey [name] would be at acceptable risk for the planned procedure without further cardiovascular testing.   Patient was advised that if she develops new symptoms prior to surgery to contact our office to arrange a follow-up appointment. She verbalized understanding.  Per office protocol, patient can hold Eliquis  for 2 days prior to procedure.   Patient will not need bridging with Lovenox (enoxaparin) around procedure.  I will route this recommendation to the requesting party via Epic fax function and remove from pre-op pool.  Please call with questions.  [name], NP-C 06/05/2023, 8:56 AM [address], [address] Office [phone] Fax [phone]

## 2023-06-05 NOTE — Telephone Encounter (Signed)
 Pt has been scheduled for 06/30/23. Instructions mailed and prep sent to the pharmacy.

## 2023-06-05 NOTE — Telephone Encounter (Signed)
 Please advise. Thank you

## 2023-06-05 NOTE — Telephone Encounter (Signed)
 Reviewed.  Please proceed with scheduling procedures.  Patient will need to hold Eliquis for 2 days prior.

## 2023-06-09 ENCOUNTER — Telehealth: Payer: Self-pay

## 2023-06-09 ENCOUNTER — Ambulatory Visit: Payer: Medicare Other | Admitting: Primary Care

## 2023-06-09 NOTE — Telephone Encounter (Signed)
 Message left on clinical intake voicemail:    Patient has had multiple providers advising differently on the taking and not taking status of her iron supplement. Patient believes the iron supplement is contributing to her GI issues and would like a return call to further discuss.  Outgoing call placed to patient, no answer, left voicemail requesting patient return call and select option to schedule an appointment (Video or in person to further discuss).

## 2023-06-10 ENCOUNTER — Encounter: Payer: Self-pay | Admitting: *Deleted

## 2023-06-10 ENCOUNTER — Telehealth: Payer: Medicare Other | Admitting: Sports Medicine

## 2023-06-10 ENCOUNTER — Encounter: Payer: Self-pay | Admitting: Sports Medicine

## 2023-06-10 ENCOUNTER — Encounter: Payer: Self-pay | Admitting: Gastroenterology

## 2023-06-10 ENCOUNTER — Telehealth: Payer: Self-pay | Admitting: *Deleted

## 2023-06-10 DIAGNOSIS — D509 Iron deficiency anemia, unspecified: Secondary | ICD-10-CM

## 2023-06-10 DIAGNOSIS — R1030 Lower abdominal pain, unspecified: Secondary | ICD-10-CM | POA: Diagnosis not present

## 2023-06-10 DIAGNOSIS — K5900 Constipation, unspecified: Secondary | ICD-10-CM | POA: Diagnosis not present

## 2023-06-10 DIAGNOSIS — E0590 Thyrotoxicosis, unspecified without thyrotoxic crisis or storm: Secondary | ICD-10-CM | POA: Diagnosis not present

## 2023-06-10 DIAGNOSIS — E039 Hypothyroidism, unspecified: Secondary | ICD-10-CM

## 2023-06-10 NOTE — Telephone Encounter (Signed)
 I was unaware that you already called to asked. Dr.Veludandi just asked me to call Endocrinologist office and ask the same question. I was transferred to Nurse Nia and she also placed a message for this question to be answered. She states that we should have an answer by the end of the day or tomorrow. Message routed to PCP Sherlynn Madden, MD and Chrae.B/CMA.

## 2023-06-10 NOTE — Telephone Encounter (Signed)
 I am fine with her taking Linzess  72 mcg rather than 145 mcg in light of diarrhea. However, if she is having severe abdominal pain,12/10 in severity, she needs to proceed to the ER for repeat imaging that can be performed STAT. Looks like her PCP has recommended the same.

## 2023-06-10 NOTE — Telephone Encounter (Signed)
 Another outgoing call placed to patient. Left message on voicemail for patient to return call when available. See mychart message also

## 2023-06-10 NOTE — Telephone Encounter (Signed)
 Pt and her daughter (DPR) have called several times stating that she is having severe pain in her lower stomach that is on her left side and goes around to her back. She is rating the pain at a 12. She states it is worse at night. She state she is taking Linzess  145mcg daily and having diarrhea. She would like to know if there is something she could have for pain or if her colonoscopy could be moved to a sooner date. She would also like to try Linzess  72mcg to see if this would be better of her.

## 2023-06-10 NOTE — Progress Notes (Signed)
 Careteam: Patient Care Team: Sherlynn Madden, MD as PCP - General (Internal Medicine) Croitoru, Jerel, MD as PCP - Cardiology (Cardiology) Shelah Lamar RAMAN, MD as Consulting Physician (Pulmonary Disease) Cleotilde Sewer, OD (Optometry) Dannielle Bouchard, DO as Consulting Physician (Obstetrics and Gynecology) Llewellyn Gerard LABOR, DO as Consulting Physician (Otolaryngology)  PLACE OF SERVICE:  [facility]  Advanced Directive information Does Patient Have a Medical Advance Directive?: Yes, Type of Advance Directive: Healthcare Power of [place];Living will;Out of facility DNR (pink MOST or yellow form), Does patient want to make changes to medical advance directive?: No - Patient declined  Allergies  Allergen Reactions   Codeine Nausea And Vomiting and Nausea Only   Demerol  Hcl [Meperidine ] Nausea Only   Ketorolac  Nausea And Vomiting, Nausea Only and Other (See Comments)    pt told to never take this med   Demerol  Nausea And Vomiting   Multaq  [Dronedarone ] Other (See Comments)    Increased fluid retention   Flexeril  [Cyclobenzaprine ] Nausea And Vomiting   Xarelto [Rivaroxaban] Diarrhea   Latex Rash    Pt stated on 09/20/2022    Chief Complaint  Patient presents with   Medication Problem    Patient complains that she's having pain from Iron medication.                                      Video virtual visit                                      Start time 3.00                                       End time 3.15    Pt was evaluated by a video virtual visit for lower abdominal pain and constipation. Pt c/o pain on the left side and radiating to the lower back. The pain, which has been ongoing for approximately two weeks, has escalated from an initial intensity of seven or eight . The pain is so severe that it wakes the patient at night and hinders her ability to eat, with only a few bites of lunch consumed due to the discomfort.  The patient also reports a feeling of fullness,  as if filled up at the top. Despite taking Linzess  and Miralax , the patient has been experiencing constipation and has not had a bowel movement. She also reports a recent episode of nausea and the urge to vomit, which is a new symptom. Pt is able to tolerate po intake, she ate her breakfast and had light lunch   The patient's medication regimen includes iron pills, which were temporarily discontinued on the advice of the gastroenterologist due to their constipating side effect. However, the iron pills were resumed, and the patient believes this has exacerbated her abdominal pain and constipation. Pt went to ED and had CT which showed  IMPRESSION: 1. No acute abnormality. No evidence of bowel obstruction or acute bowel inflammation. Mild sigmoid diverticulosis, without evidence of acute diverticulitis.  The patient also reports normal urination without any burning sensation or blood, and denies any breathing difficulties or chest pain. She has been prescribed Linzess  for constipation, but the last dose has been taken and a refill is needed. The patient has  also considered using a suppository to alleviate the constipation.  The patient's pain and constipation persist despite these interventions, causing significant distress and prompting this consultation.  Review of Systems:  Review of Systems  Constitutional:  Negative for chills and fever.  HENT:  Negative for congestion and sore throat.   Eyes:  Negative for double vision.  Respiratory:  Negative for cough, sputum production and shortness of breath.   Cardiovascular:  Negative for chest pain, palpitations and leg swelling.  Gastrointestinal:  Positive for abdominal pain, blood in stool, constipation and nausea. Negative for heartburn.  Genitourinary:  Negative for dysuria, frequency and hematuria.  Musculoskeletal:  Negative for falls and myalgias.  Neurological:  Negative for dizziness, sensory change and focal weakness.   Negative  unless indicated in HPI.   Past Medical History:  Diagnosis Date   Acute on chronic diastolic CHF (congestive heart failure), NYHA class 1 (HCC) 04/29/2013   Allergic rhinitis    Brady-tachy syndrome (HCC)    s/p STJ dual chamber PPM by Dr Francyne   CAD (coronary artery disease)    70% RCA stenosis, treated medically   Control of atrial fibrillation with pacemaker (HCC)    Diabetes mellitus (HCC)    type 2   DJD (degenerative joint disease)    Dyspnea    occasional   Hearing loss    Right ear only - no hearing aids   Hypertension    Hyperthyroidism    Paroxysmal atrial fibrillation (HCC)    Pacemaker St Jude   Pneumonia    years ago- before 2012 per patient   Polio    as a child   PONV (postoperative nausea and vomiting)    Presence of permanent cardiac pacemaker    St Jude pacemaker   Pulmonary disease    Sleep apnea    uses  CPAP nightly   Past Surgical History:  Procedure Laterality Date   arm surgery d/t fx Right 1996   ATRIAL FIBRILLATION ABLATION  07/31/2012; 08-10-13   PVI by Dr Kelsie   ATRIAL FIBRILLATION ABLATION N/A 07/31/2012   Procedure: ATRIAL FIBRILLATION ABLATION;  Surgeon: Lynwood Kelsie, MD;  Location: [facility] CATH LAB;  Service: Cardiovascular;  Laterality: N/A;   ATRIAL FIBRILLATION ABLATION N/A 08/10/2013   Procedure: ATRIAL FIBRILLATION ABLATION;  Surgeon: Lynwood JONETTA Kelsie, MD;  Location: [facility] CATH LAB;  Service: Cardiovascular;  Laterality: N/A;   BACK SURGERY  07/26/2012   [facility]. [facility]   CARDIAC CATHETERIZATION  12/03/2010   single vessel mid RCA   CARDIAC ELECTROPHYSIOLOGY MAPPING AND ABLATION  07/31/2012   Dr. Lynwood Kelsie   CESAREAN SECTION  09/20/1974   Dr. Lonni   CHOLECYSTECTOMY  1994   COLONOSCOPY WITH PROPOFOL  N/A 03/21/2015   Procedure: COLONOSCOPY WITH PROPOFOL ;  Surgeon: Renaye Sous, MD;  Location: [facility] ENDOSCOPY;  Service: Endoscopy;  Laterality: N/A;   DIAGNOSTIC MAMMOGRAM  04/16/2017   Duwaine HERO. Morris, DO   EYE SURGERY Bilateral     remove cataracts   LAPAROSCOPIC HYSTERECTOMY  Late 31s to early 82s   LEFT HEART CATH AND CORONARY ANGIOGRAPHY N/A 07/30/2017   Procedure: LEFT HEART CATH AND CORONARY ANGIOGRAPHY;  Surgeon: Burnard Debby LABOR, MD;  Location: [facility] INVASIVE CV LAB;  Service: Cardiovascular;  Laterality: N/A;   LUMBAR LAMINECTOMY/DECOMPRESSION MICRODISCECTOMY Right 12/23/2012   Procedure: Right L5-S1 Laminotomy for resection of synovial cyst;  Surgeon: Lamar LELON Peaches, MD;  Location: [facility] NEURO ORS;  Service: Neurosurgery;  Laterality: Right;  Right Lumbar five-sacral one Laminotomy for  resection of synovial cyst   NM MYOCAR PERF WALL MOTION  12/20/2010   normal   PACEMAKER IMPLANT N/A 08/09/2019   Procedure: PACEMAKER IMPLANT;  Surgeon: Francyne Headland, MD;  Location: [facility] INVASIVE CV LAB;  Service: Cardiovascular;  Laterality: N/A;   PACEMAKER INSERTION  12/03/2010   SJM Accent DR RF implanted by Dr Francyne   RIGHT HEART CATH N/A 04/22/2018   Procedure: RIGHT HEART CATH;  Surgeon: Darron Deatrice LABOR, MD;  Location: [facility] INVASIVE CV LAB;  Service: Cardiovascular;  Laterality: N/A;   RIGHT HEART CATH N/A 07/09/2022   Procedure: RIGHT HEART CATH;  Surgeon: Cherrie Toribio SAUNDERS, MD;  Location: [facility] INVASIVE CV LAB;  Service: Cardiovascular;  Laterality: N/A;   TEE WITHOUT CARDIOVERSION N/A 07/30/2012   Procedure: TRANSESOPHAGEAL ECHOCARDIOGRAM (TEE);  Surgeon: Headland Francyne, MD;  Location: [facility] ENDOSCOPY;  Service: Cardiovascular;  Laterality: N/A;  h&p in file-hope   TEE WITHOUT CARDIOVERSION N/A 08/09/2013   Procedure: TRANSESOPHAGEAL ECHOCARDIOGRAM (TEE);  Surgeon: Redell GORMAN Shallow, MD;  Location[facility] ENDOSCOPY;  Service: Cardiovascular;  Laterality: N/A;   THYROID  LOBECTOMY Right 09/20/2022   Procedure: RIGHT HEMITHYROIDECTOMY;  Surgeon: Llewellyn Gerard LABOR, DO;  Location: [facility] OR;  Service: ENT;  Laterality: Right;   TOTAL ABDOMINAL HYSTERECTOMY  1981   ULTRASOUND GUIDANCE FOR VASCULAR ACCESS  04/22/2018   Procedure:  Ultrasound Guidance For Vascular Access;  Surgeon: Darron Deatrice LABOR, MD;  Location: [facility] INVASIVE CV LAB;  Service: Cardiovascular;;   Social History:   reports that she has never smoked. She has never used smokeless tobacco. She reports that she does not drink alcohol and does not use drugs.  Family History  Problem Relation Age of Onset   Cancer Brother    Heart attack Sister    Breast cancer Sister    Diabetes Sister    Heart disease Sister    Breast cancer Sister    Dementia Sister    Colon cancer Sister    Heart disease Sister    Breast cancer Sister    Suicidality Brother    Prostate cancer Brother    Dementia Brother     Medications: Patient's Medications  New Prescriptions   No medications on file  Previous Medications   ACETAMINOPHEN  (TYLENOL ) 500 MG TABLET    Take 500-1,000 mg by mouth every 6 (six) hours as needed (pain.).   CHOLECALCIFEROL  (VITAMIN D3) 1000 UNITS CAPS    Take 1,000 Units by mouth in the morning.   ELIQUIS  5 MG TABS TABLET    Take 5 mg by mouth 2 (two) times daily.   FLUTICASONE  (FLONASE ) 50 MCG/ACT NASAL SPRAY    Place 2 sprays into both nostrils daily.   FUROSEMIDE  (LASIX ) 80 MG TABLET    Take 40 mg by mouth in the morning and at bedtime. 1/2 tablet morning and night   GLUCOSE BLOOD (ONETOUCH ULTRA) TEST STRIP    USE DAILY TO CHECK BLOOD SUGAR E11.8   LANCETS MISC. (ONE TOUCH SURESOFT) MISC    Use to test  Blood sugar once daily DX: E11.8,   LEVOTHYROXINE (SYNTHROID) 25 MCG TABLET    Take 25 mcg by mouth daily before breakfast.   MAGNESIUM  OXIDE (MAG-OX) 400 (240 MG) MG TABLET    Take 400 mg by mouth daily.   METHIMAZOLE  (TAPAZOLE ) 10 MG TABLET    TAKE ONE TABLET BY MOUTH EVERY DAY WITH FOOD   METOPROLOL  TARTRATE (LOPRESSOR ) 25 MG TABLET    Take 12.5 mg by mouth in the morning and  at bedtime.   ONDANSETRON  (ZOFRAN ) 4 MG TABLET    Take 1 tablet (4 mg total) by mouth every 8 (eight) hours as needed for nausea or vomiting.   PANTOPRAZOLE  (PROTONIX ) 40  MG TABLET    TAKE ONE TABLET BY MOUTH EVERY DAY   POTASSIUM CHLORIDE  SA (KLOR-CON  M) 20 MEQ TABLET    TAKE ONE TABLET BY MOUTH TWICE DAILY   SILDENAFIL  (REVATIO ) 20 MG TABLET    TAKE ONE TABLET BY MOUTH 3 TIMES DAILY   SPIRONOLACTONE  (ALDACTONE ) 25 MG TABLET    Take 1 tablet (25 mg total) by mouth daily.  Modified Medications   No medications on file  Discontinued Medications   BENZONATATE  (TESSALON ) 200 MG CAPSULE    Take 1 capsule (200 mg total) by mouth 2 (two) times daily as needed for cough.   DOCUSATE SODIUM  (COLACE) 100 MG CAPSULE    Take 1 capsule (100 mg total) by mouth 2 (two) times daily.   FERROUS GLUCONATE  (FERGON) 324 MG TABLET    Take 1 tablet (324 mg total) by mouth every other day.   METFORMIN  (GLUCOPHAGE ) 500 MG TABLET    Take 1 tablet (500 mg total) by mouth 2 (two) times daily with a meal.   METOPROLOL  TARTRATE (LOPRESSOR ) 25 MG TABLET    Take 1 tablet (25 mg total) by mouth 2 (two) times daily.    Physical Exam: There were no vitals filed for this visit. There is no height or weight on file to calculate BMI. BP Readings from Last 3 Encounters:  06/02/23 116/73  05/25/23 (!) 126/58  05/13/23 118/64   Wt Readings from Last 3 Encounters:  06/02/23 120 lb 9.6 oz (54.7 kg)  05/13/23 121 lb 9.6 oz (55.2 kg)  05/12/23 121 lb 6.4 oz (55.1 kg)      Labs reviewed: Basic Metabolic Panel: Recent Labs    11/01/22 1233 11/19/22 1418 01/13/23 1300 04/15/23 1544 05/12/23 1626 05/13/23 1418 05/25/23 1030  NA 134 135  --  135 138 136 132*  K 4.7 4.3  --  4.0 5.0 4.6 4.2  CL 94* 95*  --  96* 98 100 97*  CO2 20 22  --  23 22 27 25   GLUCOSE 113* 94  --  112* 82 96 123*  BUN 18 20  --  15 12 13 17   CREATININE 1.34* 1.41*   < > 1.08* 1.18* 1.09* 1.13*  CALCIUM  9.1 9.0  --  9.5 9.2 9.4 9.1  MG 2.4* 2.5*  --  2.2  --   --   --   TSH  --   --   --   --  4.650*  --   --    < > = values in this interval not displayed.   Liver Function Tests: Recent Labs     08/01/22 1434 05/25/23 1030  AST 17 13*  ALT 8 9  ALKPHOS 123* 92  BILITOT 1.2 1.0  PROT 6.7 6.9  ALBUMIN  4.4 3.9   Recent Labs    05/25/23 1030  LIPASE 34   No results for input(s): AMMONIA in the last 8760 hours. CBC: Recent Labs    04/15/23 1544 05/13/23 1418 05/25/23 1030  WBC 7.0 7.0 5.6  NEUTROABS 4,949  --   --   HGB 11.2* 10.4* 10.6*  HCT 38.6 35.4 37.9  MCV 78.9* 78.3* 81.3  PLT 350 317 324   Lipid Panel: No results for input(s): CHOL, HDL, LDLCALC, TRIG, CHOLHDL, LDLDIRECT in the  last 8760 hours. TSH: Recent Labs    05/12/23 1626  TSH 4.650*   A1C: Lab Results  Component Value Date   HGBA1C 7.3 (H) 05/13/2023     Assessment/Plan  Iron Deficiency Anemia Hemoglobin of 10 with low serum iron. Iron supplementation causing constipation. -Hold off on iron supplementation until after gastroenterology consultation.     Abdominal pain   Pt c/o severe pain in her lower belly with 1 episode of vomiting this morning .  Recent CT scan showed stool burden without other abnormalities.  Patient has been taking Linzess  and Miralax , but still experiencing symptoms. -Continue Linzess  and Miralax  as prescribed. -Encourage increased fluid intake, warm prune juice, and physical activity to promote gut motility. -Consider use of suppositories. -If pain worsens or vomiting occurs, patient should go to the emergency room for possible fecal impaction or bowel obstruction.   Hyperthyroidism  Pt is on both methimazole  and synthroid  Nurse tried to contact endo office, they are going to speak with the provider and get back to us .

## 2023-06-10 NOTE — Telephone Encounter (Signed)
 Hailey [name] repeated over and over patient is in a ton of pain, not able to do a video visit, and it would be hard for her to come to office

## 2023-06-10 NOTE — Progress Notes (Signed)
   This service is provided via telemedicine  No vital signs collected/recorded due to the encounter was a telemedicine visit.   Location of patient (ex: home, work):  Home  Patient consents to a telephone visit:  Yes  Location of the provider (ex: office, home):  [employer]  Name of any referring provider:  [name], MD   Names of all persons participating in the telemedicine service and their role in the encounter:  Patient, Meda Klinefelter, RMA, Dr.Prashanthi Veludandi.    Time spent on call:  8 minutes spent on the phone with Medical Assistant.

## 2023-06-10 NOTE — Telephone Encounter (Signed)
 This encounter was created in error - please disregard.

## 2023-06-10 NOTE — Telephone Encounter (Signed)
 Hailey Madden, MD to Me     06/10/23 12:01 PM  Can you also reach outo her endocrinology office Dr Dale to see if patient should be on both levothyroxine and methimazole . I informed son at my last visit to call their office to confirm what she should be taking. Thank you.    [facility]  [address]  [address]  [place], [place] [address]  [phone]   Outgoing call placed to the above number. Patients endo (Dr.Skotnicki) is out on maternity leave, however the receptionist sent me to her assistant [name] and I left a detailed message with Dr.Veludandi's request. I advised Hailey that if she returns the call and get a voicemail to leave a detail message with response to Dr.Veludandi's question.

## 2023-06-10 NOTE — Telephone Encounter (Signed)
 Patients daughter [name] called stating can [name], MD address this without an appointment.  Please advise

## 2023-06-11 ENCOUNTER — Ambulatory Visit: Payer: Medicare Other | Admitting: Sports Medicine

## 2023-06-11 ENCOUNTER — Other Ambulatory Visit: Payer: Self-pay | Admitting: *Deleted

## 2023-06-11 ENCOUNTER — Other Ambulatory Visit: Payer: Self-pay | Admitting: Gastroenterology

## 2023-06-11 DIAGNOSIS — R110 Nausea: Secondary | ICD-10-CM

## 2023-06-11 DIAGNOSIS — R1032 Left lower quadrant pain: Secondary | ICD-10-CM

## 2023-06-11 DIAGNOSIS — K5900 Constipation, unspecified: Secondary | ICD-10-CM

## 2023-06-11 DIAGNOSIS — M545 Low back pain, unspecified: Secondary | ICD-10-CM

## 2023-06-11 DIAGNOSIS — R6881 Early satiety: Secondary | ICD-10-CM

## 2023-06-11 MED ORDER — PANTOPRAZOLE SODIUM 40 MG PO TBEC: 40.0000 mg | DELAYED_RELEASE_TABLET | Freq: Two times a day (BID) | ORAL | 3 refills | Status: DC

## 2023-06-11 MED ORDER — LINACLOTIDE 72 MCG PO CAPS: 72.0000 ug | ORAL_CAPSULE | Freq: Every day | ORAL | 3 refills | Status: DC

## 2023-06-11 NOTE — Telephone Encounter (Signed)
Noted-  Sent MyChart message

## 2023-06-11 NOTE — Telephone Encounter (Signed)
 Spoke with Hailey [name] as well as the patient.  Patient states she is having daily bowel movements, typically 2 or 3 diarrhea type stools every day.  States they come hard and fast and that this has been a bit difficult for her.  She is having to take a couple showers a day because of this.  Size of the bowel movements are large.  She would like to try decreasing the dose of Linzess  to 72 mcg to see if this allows her to continue to have daily bowel movements, but not quite so much diarrhea.    Also reports worsening of her lower abdominal pain radiating to her back.  States it was a 7 or 8 out of 10, now it is constantly a 10 out of 10 and gets even worse at nighttime.  Tylenol  is not helping.   She continues to feel full after small amounts of food and has nausea but has only had 1 episode of vomiting.  She does take pantoprazole  daily.  I have recommended the following: Decrease Linzess  to 72 mcg daily. Increase pantoprazole  to 40 mg twice daily. CT A/P with contrast ASAP  Mandy/Tammy: Please arrange CT A/P with contrast ASAP.  Dx: LLQ abdominal pain/left low back pain.  Please see prior patient message below with possible date that woul work best for her.  Patient has requested that you call her daughter Hailey [name] with the scheduling information for the CT.

## 2023-06-11 NOTE — Telephone Encounter (Signed)
 Pt has been scheduled for Thursday 06/19/23, arrive at 8:30 am. Pt's daughter Vernona Rieger (on dpr) informed of appt date,, time and location

## 2023-06-11 NOTE — Telephone Encounter (Signed)
 There was no miscommunication, when I spoke with the daughter, she stated her mother was having 3 to 4 loose watery stool a day and she was taking a shower after each one. I don't know what she told her PCP but that is what was told to me. I asked was she still taking the MiraLax  and Senna she stated no. I asked if she would like to change to a different strength of Linzess  because maybe the 145 was to strong, she said may that would be a good idea. She stated that she was having severe pain at night in her left lower side that went around to her back.

## 2023-06-11 NOTE — Telephone Encounter (Signed)
 As of now, we have not heard back from the endocrinologist, however 2 messages was placed to them yesterday, and according to Jasmine's documentation we can expect to hear back by the end of today.     Sherlynn Madden, MD to [place], Jasmine E, CMA  Me     06/11/23  9:07 AM  Did we get any information back from their office ?

## 2023-06-11 NOTE — Telephone Encounter (Signed)
 Spoke with Hailey [name] as I was unable to reach patient. Hailey [name] states she is also confused about her mothers current symptoms as there has been some conflicting reports. She plans to reach out to her mom to see if she can nail down exactly what is happening with her bowels and her pain and will call us  back today with that information.

## 2023-06-17 NOTE — Addendum Note (Signed)
 Addended by: Maurice Small on: 06/17/2023 02:14 PM   Modules accepted: Orders

## 2023-06-17 NOTE — Telephone Encounter (Signed)
 Hailey [name] with [facility] called to say patient should only be on Levothyroxine, needs to stop methimazole and recheck TSH 6 weeks after checking.

## 2023-06-17 NOTE — Telephone Encounter (Unsigned)
 Endo recommended to stop methimazole

## 2023-06-18 ENCOUNTER — Encounter: Payer: Self-pay | Admitting: Sports Medicine

## 2023-06-18 ENCOUNTER — Ambulatory Visit (INDEPENDENT_AMBULATORY_CARE_PROVIDER_SITE_OTHER): Payer: Medicare Other | Admitting: Sports Medicine

## 2023-06-18 VITALS — BP 110/78 | HR 70 | Resp 18 | Ht 59.0 in | Wt 118.2 lb

## 2023-06-18 DIAGNOSIS — K5900 Constipation, unspecified: Secondary | ICD-10-CM

## 2023-06-18 DIAGNOSIS — G8929 Other chronic pain: Secondary | ICD-10-CM

## 2023-06-18 DIAGNOSIS — M545 Low back pain, unspecified: Secondary | ICD-10-CM

## 2023-06-18 DIAGNOSIS — Z9889 Other specified postprocedural states: Secondary | ICD-10-CM | POA: Diagnosis not present

## 2023-06-18 MED ORDER — LIDOCAINE 4 % EX PTCH: 1.0000 | MEDICATED_PATCH | CUTANEOUS | 1 refills | Status: DC

## 2023-06-18 MED ORDER — PREDNISONE 20 MG PO TABS: 40.0000 mg | ORAL_TABLET | Freq: Every day | ORAL | 0 refills | Status: DC

## 2023-06-18 NOTE — Progress Notes (Signed)
 Careteam: Patient Care Team: Tye Gall, MD as PCP - General (Internal Medicine) Croitoru, Karyl Paget, MD as PCP - Cardiology (Cardiology) Denson Flake, MD as Consulting Physician (Pulmonary Disease) Princella Brooklyn, OD (Optometry) Dyanna Glasgow, DO as Consulting Physician (Obstetrics and Gynecology) Daleen Dubs, DO as Consulting Physician (Otolaryngology)  PLACE OF SERVICE:  [facility] CLINIC  Advanced Directive information    Allergies  Allergen Reactions   Codeine Nausea And Vomiting and Nausea Only   Demerol  Hcl [Meperidine ] Nausea Only   Ketorolac  Nausea And Vomiting, Nausea Only and Other (See Comments)    "pt told to never take this med"   Demerol  Nausea And Vomiting   Multaq  [Dronedarone ] Other (See Comments)    Increased fluid retention   Flexeril  [Cyclobenzaprine ] Nausea And Vomiting   Xarelto [Rivaroxaban] Diarrhea   Latex Rash    Pt stated on 09/20/2022    Chief Complaint  Patient presents with   Acute Visit    The continual pain in your lower back. The pain has intensified and a follow-up is needed.      Discussed the use of AI scribe software for clinical note transcription with the patient, who gave verbal consent to proceed.  History of Present Illness   The patient with chronic LLQ pain with subsequent urological and gastroenterological evaluations, presents with persistent and worsening abdominal and back pain. The pain, described as starting in the lower abdomen and radiating to the back, has been ongoing for several weeks. The severity of the pain has escalated to the point where it disrupts sleep and necessitates the use of a walker for mobility. The patient reports walking for an hour at a time to cope with the discomfort, which is not alleviated by the use of a heating pad.  The patient is scheduled for a CT scan and a colonoscopy, but there is concern about managing the pain until these procedures can be completed. The patient is currently  taking Tylenol  for pain management, but reports that it is not providing sufficient relief. The patient also reports alternating constipation and diarrhea, which is being managed with Linzess .  The patient has a history of back surgery and is currently experiencing lower back pain which radiates to her Left groin.The patient reports that the pain is constant and does not worsen with movement. The patient also reports leg cramps, but denies any weakness or tingling in the lower extremities.  The patient's pain is severe upon waking and after lying down for extended periods. The patient sleeps at a 45-degree angle due to the discomfort. The patient denies any fever or vomiting, but reports feeling nauseated.        Review of Systems:  Review of Systems  Constitutional:  Negative for chills and fever.  HENT:  Negative for congestion and sore throat.   Eyes:  Negative for double vision.  Respiratory:  Negative for cough, sputum production and shortness of breath.   Cardiovascular:  Negative for chest pain, palpitations and leg swelling.  Gastrointestinal:  Positive for abdominal pain and constipation. Negative for heartburn and nausea.  Genitourinary:  Negative for dysuria, frequency and hematuria.  Musculoskeletal:  Positive for back pain. Negative for falls and myalgias.  Neurological:  Negative for dizziness, sensory change and focal weakness.   Negative unless indicated in HPI.   Past Medical History:  Diagnosis Date   Acute on chronic diastolic CHF (congestive heart failure), NYHA class 1 (HCC) 04/29/2013   Allergic rhinitis    Brady-tachy syndrome (HCC)  s/p STJ dual chamber PPM by Dr Alvis Ba   CAD (coronary artery disease)    70% RCA stenosis, treated medically   Control of atrial fibrillation with pacemaker (HCC)    Diabetes mellitus (HCC)    type 2   DJD (degenerative joint disease)    Dyspnea    occasional   Hearing loss    Right ear only - no hearing aids   Hypertension     Hyperthyroidism    Paroxysmal atrial fibrillation (HCC)    Pacemaker St Jude   Pneumonia    years ago- before 2012 per patient   Polio    as a child   PONV (postoperative nausea and vomiting)    Presence of permanent cardiac pacemaker    St Jude pacemaker   Pulmonary disease    Sleep apnea    uses  CPAP nightly   Past Surgical History:  Procedure Laterality Date   arm surgery d/t fx Right 1996   ATRIAL FIBRILLATION ABLATION  07/31/2012; 08-10-13   PVI by Dr Nunzio Belch   ATRIAL FIBRILLATION ABLATION N/A 07/31/2012   Procedure: ATRIAL FIBRILLATION ABLATION;  Surgeon: Jolly Needle, MD;  Location: [facility] CATH LAB;  Service: Cardiovascular;  Laterality: N/A;   ATRIAL FIBRILLATION ABLATION N/A 08/10/2013   Procedure: ATRIAL FIBRILLATION ABLATION;  Surgeon: Ellaree Gunther, MD;  Location: [facility] CATH LAB;  Service: Cardiovascular;  Laterality: N/A;   BACK SURGERY  07/26/2012   [facility]. [facility]   CARDIAC CATHETERIZATION  12/03/2010   single vessel mid RCA   CARDIAC ELECTROPHYSIOLOGY MAPPING AND ABLATION  07/31/2012   Dr. Jolly Needle   CESAREAN SECTION  09/20/1974   Dr. Vearl Georgia   CHOLECYSTECTOMY  1994   COLONOSCOPY WITH PROPOFOL  N/A 03/21/2015   Procedure: COLONOSCOPY WITH PROPOFOL ;  Surgeon: Tami Falcon, MD;  Location: [facility] ENDOSCOPY;  Service: Endoscopy;  Laterality: N/A;   DIAGNOSTIC MAMMOGRAM  04/16/2017   Samson Croak. Morris, DO   EYE SURGERY Bilateral    remove cataracts   LAPAROSCOPIC HYSTERECTOMY  Late 12s to early 11s   LEFT HEART CATH AND CORONARY ANGIOGRAPHY N/A 07/30/2017   Procedure: LEFT HEART CATH AND CORONARY ANGIOGRAPHY;  Surgeon: Millicent Ally, MD;  Location: [facility] INVASIVE CV LAB;  Service: Cardiovascular;  Laterality: N/A;   LUMBAR LAMINECTOMY/DECOMPRESSION MICRODISCECTOMY Right 12/23/2012   Procedure: Right L5-S1 Laminotomy for resection of synovial cyst;  Surgeon: Corrina Dimitri, MD;  Location: [facility] NEURO ORS;  Service: Neurosurgery;  Laterality: Right;  Right Lumbar five-sacral one  Laminotomy for resection of synovial cyst   NM MYOCAR PERF WALL MOTION  12/20/2010   normal   PACEMAKER IMPLANT N/A 08/09/2019   Procedure: PACEMAKER IMPLANT;  Surgeon: Luana Rumple, MD;  Location: [facility] INVASIVE CV LAB;  Service: Cardiovascular;  Laterality: N/A;   PACEMAKER INSERTION  12/03/2010   SJM Accent DR RF implanted by Dr Alvis Ba   RIGHT HEART CATH N/A 04/22/2018   Procedure: RIGHT HEART CATH;  Surgeon: Wenona Hamilton, MD;  Location[facility] INVASIVE CV LAB;  Service: Cardiovascular;  Laterality: N/A;   RIGHT HEART CATH N/A 07/09/2022   Procedure: RIGHT HEART CATH;  Surgeon: Mardell Shade, MD;  Location: [facility] INVASIVE CV LAB;  Service: Cardiovascular;  Laterality: N/A;   TEE WITHOUT CARDIOVERSION N/A 07/30/2012   Procedure: TRANSESOPHAGEAL ECHOCARDIOGRAM (TEE);  Surgeon: Luana Rumple, MD;  Location[facility] ENDOSCOPY;  Service: Cardiovascular;  Laterality: N/A;  h&p in file-hope   TEE WITHOUT CARDIOVERSION N/A 08/09/2013   Procedure: TRANSESOPHAGEAL ECHOCARDIOGRAM (TEE);  Surgeon: Polly Brink  Lourdes Roy, MD;  Location: [facility] ENDOSCOPY;  Service: Cardiovascular;  Laterality: N/A;   THYROID  LOBECTOMY Right 09/20/2022   Procedure: RIGHT HEMITHYROIDECTOMY;  Surgeon: Daleen Dubs, DO;  Location: [facility] OR;  Service: ENT;  Laterality: Right;   TOTAL ABDOMINAL HYSTERECTOMY  1981   ULTRASOUND GUIDANCE FOR VASCULAR ACCESS  04/22/2018   Procedure: Ultrasound Guidance For Vascular Access;  Surgeon: Wenona Hamilton, MD;  Location: [facility] INVASIVE CV LAB;  Service: Cardiovascular;;   Social History:   reports that she has never smoked. She has never used smokeless tobacco. She reports that she does not drink alcohol and does not use drugs.  Family History  Problem Relation Age of Onset   Cancer Brother    Heart attack Sister    Breast cancer Sister    Diabetes Sister    Heart disease Sister    Breast cancer Sister    Dementia Sister    Colon cancer Sister    Heart disease Sister    Breast  cancer Sister    Suicidality Brother    Prostate cancer Brother    Dementia Brother     Medications: Patient's Medications  New Prescriptions   No medications on file  Previous Medications   ACETAMINOPHEN  (TYLENOL ) 500 MG TABLET    Take 500-1,000 mg by mouth every 6 (six) hours as needed (pain.).   CHOLECALCIFEROL  (VITAMIN D3) 1000 UNITS CAPS    Take 1,000 Units by mouth in the morning.   ELIQUIS  5 MG TABS TABLET    Take 5 mg by mouth 2 (two) times daily.   FLUTICASONE  (FLONASE ) 50 MCG/ACT NASAL SPRAY    Place 2 sprays into both nostrils daily.   FUROSEMIDE  (LASIX ) 80 MG TABLET    Take 40 mg by mouth in the morning and at bedtime. 1/2 tablet morning and night   GLUCOSE BLOOD (ONETOUCH ULTRA) TEST STRIP    USE DAILY TO CHECK BLOOD SUGAR E11.8   LANCETS MISC. (ONE TOUCH SURESOFT) MISC    Use to test  Blood sugar once daily DX: E11.8,   LEVOTHYROXINE (SYNTHROID) 25 MCG TABLET    Take 25 mcg by mouth daily before breakfast.   LINACLOTIDE  (LINZESS ) 72 MCG CAPSULE    Take 1 capsule (72 mcg total) by mouth daily before breakfast.   MAGNESIUM  OXIDE (MAG-OX) 400 (240 MG) MG TABLET    Take 400 mg by mouth daily.   METOPROLOL  TARTRATE (LOPRESSOR ) 25 MG TABLET    Take 12.5 mg by mouth in the morning and at bedtime.   ONDANSETRON  (ZOFRAN ) 4 MG TABLET    Take 1 tablet (4 mg total) by mouth every 8 (eight) hours as needed for nausea or vomiting.   PANTOPRAZOLE  (PROTONIX ) 40 MG TABLET    Take 1 tablet (40 mg total) by mouth 2 (two) times daily before a meal.   POTASSIUM CHLORIDE  SA (KLOR-CON  M) 20 MEQ TABLET    TAKE ONE TABLET BY MOUTH TWICE DAILY   SILDENAFIL  (REVATIO ) 20 MG TABLET    TAKE ONE TABLET BY MOUTH 3 TIMES DAILY   SPIRONOLACTONE  (ALDACTONE ) 25 MG TABLET    Take 1 tablet (25 mg total) by mouth daily.  Modified Medications   No medications on file  Discontinued Medications   No medications on file    Physical Exam: There were no vitals filed for this visit. There is no height or weight  on file to calculate BMI. BP Readings from Last 3 Encounters:  06/02/23 116/73  05/25/23 (!) 126/58  05/13/23 118/64   Wt Readings from Last 3 Encounters:  06/02/23 120 lb 9.6 oz (54.7 kg)  05/13/23 121 lb 9.6 oz (55.2 kg)  05/12/23 121 lb 6.4 oz (55.1 kg)    Physical Exam Constitutional:      Appearance: Normal appearance.  HENT:     Head: Normocephalic and atraumatic.  Cardiovascular:     Rate and Rhythm: Normal rate and regular rhythm.  Pulmonary:     Effort: Pulmonary effort is normal. No respiratory distress.     Breath sounds: Normal breath sounds. No wheezing.  Abdominal:     General: Bowel sounds are normal. There is no distension.     Tenderness: There is abdominal tenderness (LLQ tenderness+). There is no guarding or rebound.     Comments:    Musculoskeletal:        General: No swelling or tenderness.     Comments: Para spinal tenderness SLR neg, pain reproducible in lower back with leg elevation  Neurological:     Mental Status: She is alert. Mental status is at baseline.     Sensory: No sensory deficit.     Motor: No weakness.    Labs reviewed: Basic Metabolic Panel: Recent Labs    11/01/22 1233 11/19/22 1418 01/13/23 1300 04/15/23 1544 05/12/23 1626 05/13/23 1418 05/25/23 1030  NA 134 135  --  135 138 136 132*  K 4.7 4.3  --  4.0 5.0 4.6 4.2  CL 94* 95*  --  96* 98 100 97*  CO2 20 22  --  23 22 27 25   GLUCOSE 113* 94  --  112* 82 96 123*  BUN 18 20  --  15 12 13 17   CREATININE 1.34* 1.41*   < > 1.08* 1.18* 1.09* 1.13*  CALCIUM  9.1 9.0  --  9.5 9.2 9.4 9.1  MG 2.4* 2.5*  --  2.2  --   --   --   TSH  --   --   --   --  4.650*  --   --    < > = values in this interval not displayed.   Liver Function Tests: Recent Labs    08/01/22 1434 05/25/23 1030  AST 17 13*  ALT 8 9  ALKPHOS 123* 92  BILITOT 1.2 1.0  PROT 6.7 6.9  ALBUMIN  4.4 3.9   Recent Labs    05/25/23 1030  LIPASE 34   No results for input(s): "AMMONIA" in the last 8760  hours. CBC: Recent Labs    04/15/23 1544 05/13/23 1418 05/25/23 1030  WBC 7.0 7.0 5.6  NEUTROABS 4,949  --   --   HGB 11.2* 10.4* 10.6*  HCT 38.6 35.4 37.9  MCV 78.9* 78.3* 81.3  PLT 350 317 324   Lipid Panel: No results for input(s): "CHOL", "HDL", "LDLCALC", "TRIG", "CHOLHDL", "LDLDIRECT" in the last 8760 hours. TSH: Recent Labs    05/12/23 1626  TSH 4.650*   A1C: Lab Results  Component Value Date   HGBA1C 7.3 (H) 05/13/2023     Assessment/Plan  Abdominal Pain Severe, persistent pain in the lower abdomen and back, disrupting sleep.  Recent CT scan (05/25/2023) showed no abnormalities. Another CT scan scheduled for tomorrow by GI  Pain not responsive to Tylenol  650mg . -Increase Tylenol  to 1000mg  TID, not to exceed 3000mg /day. -Order CT scan of hip and lower back to assess for arthritis.  Instructed to monitor for worsening abdominal pain, fevers, diarrhea  Constipation/Diarrhea Patient reports alternating constipation and diarrhea.  Currently on Linzess  72mg  for constipation  . -Advise patient that stronger pain medications may exacerbate constipation.       Chronic bilateral low back pain without sciatica (Primary) H/O laminectomy - predniSONE  (DELTASONE ) 20 MG tablet; Take 2 tablets (40 mg total) by mouth daily with breakfast.  Dispense: 5 tablet; Refill: 0 - lidocaine  (HM LIDOCAINE  PATCH) 4 %; Place 1 patch onto the skin daily.  Dispense: 30 patch; Refill: 1 - Ambulatory referral to Neurosurgery - CT Lumbar Spine Wo Contrast; Future     General Health Maintenance  Instructed daughter to her mom's medications at home for any discrepancies and call office if any are found.  No follow-ups on file.:   50 min Total time spent for obtaining history,  performing a medically appropriate examination and evaluation, reviewing the tests,ordering  tests,  documenting clinical information in the electronic or other health record, independently interpreting results  ,care coordination (not separately reported)

## 2023-06-19 ENCOUNTER — Ambulatory Visit (HOSPITAL_COMMUNITY)
Admission: RE | Admit: 2023-06-19 | Discharge: 2023-06-19 | Disposition: A | Discharge status: Home / Self Care | Payer: Medicare Other | Source: Ambulatory Visit | Attending: Gastroenterology | Admitting: Gastroenterology

## 2023-06-19 ENCOUNTER — Other Ambulatory Visit: Payer: Medicare Other | Admitting: Urology

## 2023-06-19 DIAGNOSIS — R1032 Left lower quadrant pain: Secondary | ICD-10-CM | POA: Diagnosis not present

## 2023-06-19 DIAGNOSIS — Z9049 Acquired absence of other specified parts of digestive tract: Secondary | ICD-10-CM | POA: Diagnosis not present

## 2023-06-19 DIAGNOSIS — M545 Low back pain, unspecified: Secondary | ICD-10-CM | POA: Diagnosis not present

## 2023-06-19 DIAGNOSIS — K5730 Diverticulosis of large intestine without perforation or abscess without bleeding: Secondary | ICD-10-CM | POA: Diagnosis not present

## 2023-06-19 MED ORDER — IOHEXOL 300 MG/ML  SOLN
80.0000 mL | Freq: Once | INTRAMUSCULAR | Status: AC | PRN
  Administered 2023-06-19: 80 mL via INTRAVENOUS

## 2023-06-23 ENCOUNTER — Encounter: Payer: Self-pay | Admitting: *Deleted

## 2023-06-23 ENCOUNTER — Telehealth: Payer: Self-pay

## 2023-06-23 NOTE — Telephone Encounter (Signed)
I will notify patient of pain patches. Click on Chart Review and you will see patient surgery date. Message routed back to PCP [name], MD

## 2023-06-23 NOTE — Telephone Encounter (Signed)
[name], MD      She can cont with pain patches but not prednisone Also check with the patient about the date of colonoscopy, thanks.

## 2023-06-23 NOTE — Telephone Encounter (Signed)
Patient called and states that she has Colonoscopy coming up and she wants to know if she can have more prednisone and continue pain patches for her ongoing pain. It appears patient has already asked Colonoscopy office this as well. They have not given a response yet. Message routed to PCP [name], MD

## 2023-06-26 ENCOUNTER — Encounter (HOSPITAL_COMMUNITY): Payer: Self-pay

## 2023-06-26 ENCOUNTER — Encounter: Payer: Self-pay | Admitting: *Deleted

## 2023-06-26 ENCOUNTER — Other Ambulatory Visit: Payer: Self-pay

## 2023-06-26 ENCOUNTER — Encounter (HOSPITAL_COMMUNITY)
Admission: RE | Admit: 2023-06-26 | Discharge: 2023-06-26 | Disposition: A | Discharge status: Home / Self Care | Payer: Medicare Other | Source: Ambulatory Visit | Attending: Internal Medicine | Admitting: Internal Medicine

## 2023-06-26 NOTE — Pre-Procedure Instructions (Signed)
Completed PAT visit over the phone with patient and daughter [name]. Answered all questions to the best of ability, encouraged the patient to call Dr.Carvers office because she is concern with drinking the large amt of prep.

## 2023-06-26 NOTE — Telephone Encounter (Signed)
Mindy/Tammy: Patient's daughter is asking about a small volume bowel prep.  We can look into this for her and see what is covered or they can pay out-of-pocket for it if they want.  Patient will have to ensure that she is still drinking plenty fluids with a small volume prep to ensure that the prep works well and that she does not have injury to her kidney.

## 2023-06-27 ENCOUNTER — Ambulatory Visit: Payer: Medicare Other | Admitting: Primary Care

## 2023-06-27 ENCOUNTER — Encounter: Payer: Self-pay | Admitting: Cardiovascular Disease

## 2023-06-27 MED ORDER — CLENPIQ 10-3.5-12 MG-GM -GM/175ML PO SOLN: 1.0000 | ORAL | 0 refills | Status: DC

## 2023-06-27 NOTE — Progress Notes (Signed)
PERIOPERATIVE PRESCRIPTION FOR IMPLANTED CARDIAC DEVICE PROGRAMMING  Patient Information: Name:  GENEE RANN  DOB:  1946-02-11  MRN:  [id number]    Planned Procedure:   Colonoscopy with Propofol  Surgeon:  Dr. Earnest Bailey Date of Procedure: 06/30/2023 Position during surgery:   left lateral  Device Information:  Clinic EP Physician:  Dr. Rachelle Hora Croitoru   Device Type:  Pacemaker Manufacturer and Phone #:  St. Jude/[employer]: [phone] Pacemaker Dependent?:  Yes.   Date of Last Device Check:  IN OFFICE: 05/12/2023 REMOTE: 05/07/2023 Normal Device Function?:  Yes.    Electrophysiologist's Recommendations:  Have magnet available. Provide continuous ECG monitoring when magnet is used or reprogramming is to be performed.  Procedure may interfere with device function.  Magnet should be placed over device during procedure.  Per Device Clinic Standing Orders, Kizzie Ide, RN  1:56 PM 06/27/2023

## 2023-06-30 ENCOUNTER — Other Ambulatory Visit: Payer: Self-pay

## 2023-06-30 ENCOUNTER — Ambulatory Visit (HOSPITAL_COMMUNITY)
Admission: RE | Admit: 2023-06-30 | Discharge: 2023-06-30 | Disposition: A | Discharge status: Home / Self Care | Payer: Medicare Other | Attending: Internal Medicine | Admitting: Internal Medicine

## 2023-06-30 ENCOUNTER — Ambulatory Visit (HOSPITAL_BASED_OUTPATIENT_CLINIC_OR_DEPARTMENT_OTHER): Payer: Medicare Other | Admitting: Anesthesiology

## 2023-06-30 ENCOUNTER — Encounter (HOSPITAL_COMMUNITY): Payer: Self-pay | Admitting: Internal Medicine

## 2023-06-30 ENCOUNTER — Encounter (HOSPITAL_COMMUNITY)
Admission: RE | Disposition: A | Discharge status: Home / Self Care | Payer: Self-pay | Source: Home / Self Care | Attending: Internal Medicine

## 2023-06-30 ENCOUNTER — Ambulatory Visit (HOSPITAL_COMMUNITY): Payer: Medicare Other | Admitting: Anesthesiology

## 2023-06-30 DIAGNOSIS — K5730 Diverticulosis of large intestine without perforation or abscess without bleeding: Secondary | ICD-10-CM | POA: Diagnosis not present

## 2023-06-30 DIAGNOSIS — K2970 Gastritis, unspecified, without bleeding: Secondary | ICD-10-CM | POA: Diagnosis not present

## 2023-06-30 DIAGNOSIS — R6881 Early satiety: Secondary | ICD-10-CM | POA: Insufficient documentation

## 2023-06-30 DIAGNOSIS — E039 Hypothyroidism, unspecified: Secondary | ICD-10-CM | POA: Insufficient documentation

## 2023-06-30 DIAGNOSIS — Z7901 Long term (current) use of anticoagulants: Secondary | ICD-10-CM | POA: Insufficient documentation

## 2023-06-30 DIAGNOSIS — D509 Iron deficiency anemia, unspecified: Secondary | ICD-10-CM

## 2023-06-30 DIAGNOSIS — K635 Polyp of colon: Secondary | ICD-10-CM

## 2023-06-30 DIAGNOSIS — Z79890 Hormone replacement therapy (postmenopausal): Secondary | ICD-10-CM | POA: Insufficient documentation

## 2023-06-30 DIAGNOSIS — K317 Polyp of stomach and duodenum: Secondary | ICD-10-CM

## 2023-06-30 DIAGNOSIS — E0590 Thyrotoxicosis, unspecified without thyrotoxic crisis or storm: Secondary | ICD-10-CM | POA: Insufficient documentation

## 2023-06-30 DIAGNOSIS — I25119 Atherosclerotic heart disease of native coronary artery with unspecified angina pectoris: Secondary | ICD-10-CM

## 2023-06-30 DIAGNOSIS — Z950 Presence of cardiac pacemaker: Secondary | ICD-10-CM | POA: Diagnosis not present

## 2023-06-30 DIAGNOSIS — R1032 Left lower quadrant pain: Secondary | ICD-10-CM | POA: Diagnosis present

## 2023-06-30 DIAGNOSIS — D175 Benign lipomatous neoplasm of intra-abdominal organs: Secondary | ICD-10-CM | POA: Insufficient documentation

## 2023-06-30 DIAGNOSIS — Z9049 Acquired absence of other specified parts of digestive tract: Secondary | ICD-10-CM | POA: Diagnosis not present

## 2023-06-30 DIAGNOSIS — Z800 Family history of malignant neoplasm of digestive organs: Secondary | ICD-10-CM | POA: Diagnosis not present

## 2023-06-30 DIAGNOSIS — K3189 Other diseases of stomach and duodenum: Secondary | ICD-10-CM | POA: Diagnosis not present

## 2023-06-30 DIAGNOSIS — I110 Hypertensive heart disease with heart failure: Secondary | ICD-10-CM

## 2023-06-30 DIAGNOSIS — D123 Benign neoplasm of transverse colon: Secondary | ICD-10-CM | POA: Diagnosis not present

## 2023-06-30 DIAGNOSIS — K319 Disease of stomach and duodenum, unspecified: Secondary | ICD-10-CM

## 2023-06-30 DIAGNOSIS — K648 Other hemorrhoids: Secondary | ICD-10-CM | POA: Insufficient documentation

## 2023-06-30 HISTORY — PX: COLONOSCOPY WITH PROPOFOL: SHX5780

## 2023-06-30 HISTORY — PX: POLYPECTOMY: SHX5525

## 2023-06-30 HISTORY — PX: BIOPSY: SHX5522

## 2023-06-30 HISTORY — PX: ESOPHAGOGASTRODUODENOSCOPY (EGD) WITH PROPOFOL: SHX5813

## 2023-06-30 LAB — GLUCOSE, CAPILLARY: Glucose-Capillary: 125 mg/dL — ABNORMAL HIGH (ref 70–99)

## 2023-06-30 SURGERY — COLONOSCOPY WITH PROPOFOL
Anesthesia: General

## 2023-06-30 MED ORDER — PROPOFOL 10 MG/ML IV BOLUS
INTRAVENOUS | Status: DC | PRN
  Administered 2023-06-30: 80 mg via INTRAVENOUS
  Administered 2023-06-30 (×2): 40 mg via INTRAVENOUS

## 2023-06-30 MED ORDER — LACTATED RINGERS IV SOLN: INTRAVENOUS | Status: DC

## 2023-06-30 MED ORDER — PROPOFOL 500 MG/50ML IV EMUL
INTRAVENOUS | Status: DC | PRN
Start: 2023-06-30 — End: 2023-06-30
  Administered 2023-06-30: 100 ug/kg/min via INTRAVENOUS

## 2023-06-30 NOTE — Progress Notes (Signed)
Mailed patient's discharge instructions from her procedures today per her daughter's request.

## 2023-06-30 NOTE — Anesthesia Procedure Notes (Signed)
Date/Time: 06/30/2023 1:21 PM  Performed by: Franco Nones, CRNAPre-anesthesia Checklist: Patient identified, Emergency Drugs available, Suction available, Timeout performed and Patient being monitored Patient Re-evaluated:Patient Re-evaluated prior to induction Oxygen Delivery Method: Nasal cannula Comments: Optiflow

## 2023-06-30 NOTE — Interval H&P Note (Signed)
History and Physical Interval Note:  06/30/2023 1:13 PM  Hailey [name]  has presented today for surgery, with the diagnosis of IDA.  The various methods of treatment have been discussed with the patient and family. After consideration of risks, benefits and other options for treatment, the patient has consented to  Procedure(s) with comments: COLONOSCOPY WITH PROPOFOL (N/A) - 11:15 AM, ASA 4 ESOPHAGOGASTRODUODENOSCOPY (EGD) WITH PROPOFOL (N/A) - 11:15 AM, ASA 4 as a surgical intervention.  The patient's history has been reviewed, patient examined, no change in status, stable for surgery.  I have reviewed the patient's chart and labs.  Questions were answered to the patient's satisfaction.     Lanelle Bal

## 2023-06-30 NOTE — Transfer of Care (Signed)
Immediate Anesthesia Transfer of Care Note  Patient: Hailey [name]  Procedure(s) Performed: COLONOSCOPY WITH PROPOFOL ESOPHAGOGASTRODUODENOSCOPY (EGD) WITH PROPOFOL BIOPSY POLYPECTOMY  Patient Location: Short Stay  Anesthesia Type:General  Level of Consciousness: drowsy and patient cooperative  Airway & Oxygen Therapy: Patient Spontanous Breathing  Post-op Assessment: Report given to RN and Post -op Vital signs reviewed and stable  Post vital signs: Reviewed and stable  Last Vitals:  Vitals Value Taken Time  BP 128/73 06/30/23 1405  Temp 36.3 C 06/30/23 1405  Pulse 64 06/30/23 1405  Resp 22 06/30/23 1405  SpO2 100 % 06/30/23 1405    Last Pain:  Vitals:   06/30/23 1405  TempSrc: Axillary  PainSc: Asleep         Complications: No notable events documented.

## 2023-06-30 NOTE — Op Note (Signed)
[facility] Patient Name: Hailey [name] Procedure Date: 06/30/2023 12:38 PM MRN: [id number] Date of Birth: [birth year] Attending MD: Hennie Duos. Maple Mirza, [id number] CSN: [id number] Age: 78 Admit Type: Outpatient Procedure:                Colonoscopy Indications:              Abdominal pain in the left lower quadrant, Iron                            deficiency anemia Providers:                Hennie Duos. Marletta Lor, DO, Buel Ream. Thomasena Edis RN, RN,                            Lennice Sites Technician, Technician Referring MD:              Medicines:                See the Anesthesia note for documentation of the                            administered medications Complications:            No immediate complications. Estimated Blood Loss:     Estimated blood loss was minimal. Procedure:                Pre-Anesthesia Assessment:                           - The anesthesia plan was to use monitored                            anesthesia care (MAC).                           After obtaining informed consent, the colonoscope                            was passed under direct vision. Throughout the                            procedure, the patient's blood pressure, pulse, and                            oxygen saturations were monitored continuously. The                            [id number]-HQ190L ([id number]) scope was introduced through                            the anus and advanced to the the cecum, identified                            by appendiceal orifice and ileocecal valve. The                            colonoscopy was performed  without difficulty. The                            patient tolerated the procedure well. The quality                            of the bowel preparation was evaluated using the                            BBPS [facility] Bowel Preparation Scale) with scores                            of: Right Colon = 3, Transverse Colon = 3 and Left                            Colon = 3  (entire mucosa seen well with no residual                            staining, small fragments of stool or opaque                            liquid). The total BBPS score equals 9. Scope In: 1:36:57 PM Scope Out: 1:59:07 PM Scope Withdrawal Time: 0 hours 18 minutes 42 seconds  Total Procedure Duration: 0 hours 22 minutes 10 seconds  Findings:      Non-bleeding internal hemorrhoids were found during endoscopy.      Five sessile polyps were found in the transverse colon. The polyps were       4 to 13 mm in size. These polyps were removed with a cold snare.       Resection and retrieval were complete.      There was a medium-sized lipoma, in the transverse colon and in the       proximal transverse colon.      Multiple medium-mouthed and small-mouthed diverticula were found in the       sigmoid colon. Impression:               - Non-bleeding internal hemorrhoids.                           - Five 4 to 13 mm polyps in the transverse colon,                            removed with a cold snare. Resected and retrieved.                           - Medium-sized lipoma in the transverse colon and                            in the proximal transverse colon.                           - Diverticulosis in the sigmoid colon. Moderate Sedation:      Per Anesthesia Care Recommendation:           - Patient has  a contact number available for                            emergencies. The signs and symptoms of potential                            delayed complications were discussed with the                            patient. Return to normal activities tomorrow.                            Written discharge instructions were provided to the                            patient.                           - Resume previous diet.                           - Continue present medications.                           - Await pathology results.                           - No repeat colonoscopy due to age.                            - Return to GI clinic in 6 weeks. Procedure Code(s):        --- Professional ---                           [phone], Colonoscopy, flexible; with removal of                            tumor(s), polyp(s), or other lesion(s) by snare                            technique Diagnosis Code(s):        --- Professional ---                           D12.3, Benign neoplasm of transverse colon (hepatic                            flexure or splenic flexure)                           D17.5, Benign lipomatous neoplasm of                            intra-abdominal organs                           K64.8, Other hemorrhoids  R10.32, Left lower quadrant pain                           D50.9, Iron deficiency anemia, unspecified                           K57.30, Diverticulosis of large intestine without                            perforation or abscess without bleeding CPT copyright 2022 American Medical Association. All rights reserved. The codes documented in this report are preliminary and upon coder review may  be revised to meet current compliance requirements. Hennie Duos. Marletta Lor, DO Hennie Duos. Marletta Lor, DO 06/30/2023 2:03:41 PM This report has been signed electronically. Number of Addenda: 0

## 2023-06-30 NOTE — Discharge Instructions (Addendum)
EGD Discharge instructions Please read the instructions outlined below and refer to this sheet in the next few weeks. These discharge instructions provide you with general information on caring for yourself after you leave the hospital. Your doctor may also give you specific instructions. While your treatment has been planned according to the most current medical practices available, unavoidable complications occasionally occur. If you have any problems or questions after discharge, please call your doctor. ACTIVITY You may resume your regular activity but move at a slower pace for the next 24 hours.  Take frequent rest periods for the next 24 hours.  Walking will help expel (get rid of) the air and reduce the bloated feeling in your abdomen.  No driving for 24 hours (because of the anesthesia (medicine) used during the test).  You may shower.  Do not sign any important legal documents or operate any machinery for 24 hours (because of the anesthesia used during the test).  NUTRITION Drink plenty of fluids.  You may resume your normal diet.  Begin with a light meal and progress to your normal diet.  Avoid alcoholic beverages for 24 hours or as instructed by your caregiver.  MEDICATIONS You may resume your normal medications unless your caregiver tells you otherwise.  WHAT YOU CAN EXPECT TODAY You may experience abdominal discomfort such as a feeling of fullness or "gas" pains.  FOLLOW-UP Your doctor will discuss the results of your test with you.  SEEK IMMEDIATE MEDICAL ATTENTION IF ANY OF THE FOLLOWING OCCUR: Excessive nausea (feeling sick to your stomach) and/or vomiting.  Severe abdominal pain and distention (swelling).  Trouble swallowing.  Temperature over 101 F (37.8 C).  Rectal bleeding or vomiting of blood.      Colonoscopy Discharge Instructions  Read the instructions outlined below and refer to this sheet in the next few weeks. These discharge instructions provide you  with general information on caring for yourself after you leave the hospital. Your doctor may also give you specific instructions. While your treatment has been planned according to the most current medical practices available, unavoidable complications occasionally occur.   ACTIVITY You may resume your regular activity, but move at a slower pace for the next 24 hours.  Take frequent rest periods for the next 24 hours.  Walking will help get rid of the air and reduce the bloated feeling in your belly (abdomen).  No driving for 24 hours (because of the medicine (anesthesia) used during the test).   Do not sign any important legal documents or operate any machinery for 24 hours (because of the anesthesia used during the test).  NUTRITION Drink plenty of fluids.  You may resume your normal diet as instructed by your doctor.  Begin with a light meal and progress to your normal diet. Heavy or fried foods are harder to digest and may make you feel sick to your stomach (nauseated).  Avoid alcoholic beverages for 24 hours or as instructed.  MEDICATIONS You may resume your normal medications unless your doctor tells you otherwise.  WHAT YOU CAN EXPECT TODAY Some feelings of bloating in the abdomen.  Passage of more gas than usual.  Spotting of blood in your stool or on the toilet paper.  IF YOU HAD POLYPS REMOVED DURING THE COLONOSCOPY: No aspirin products for 7 days or as instructed.  No alcohol for 7 days or as instructed.  Eat a soft diet for the next 24 hours.  FINDING OUT THE RESULTS OF YOUR TEST Not all test results  are available during your visit. If your test results are not back during the visit, make an appointment with your caregiver to find out the results. Do not assume everything is normal if you have not heard from your caregiver or the medical facility. It is important for you to follow up on all of your test results.  SEEK IMMEDIATE MEDICAL ATTENTION IF: You have more than a  spotting of blood in your stool.  Your belly is swollen (abdominal distention).  You are nauseated or vomiting.  You have a temperature over 101.  You have abdominal pain or discomfort that is severe or gets worse throughout the day.   Your EGD revealed mild amount inflammation in your stomach.  I took biopsies of this to rule out infection with a bacteria called H. pylori.  Multiple polyps in your stomach as well.  These are likely benign fundic gland polyps and nothing to worry about.  I did sample them.  I also took samples of your small bowel to rule out celiac disease which can cause anemia.  Esophagus appeared normal.  Your colonoscopy revealed 5 polyp(s) which I removed successfully. Await pathology results, my office will contact you.    You also have diverticulosis and internal hemorrhoids. I would recommend increasing fiber in your diet or adding OTC Benefiber/Metamucil. Be sure to drink at least 4 to 6 glasses of water daily. Follow-up with GI in 4-6 weeks   I hope you have a great rest of your week!  Hennie Duos. Marletta Lor, D.O. Gastroenterology and Hepatology [facility]

## 2023-06-30 NOTE — Op Note (Signed)
[facility] Patient Name: Hailey [name] Procedure Date: 06/30/2023 12:42 PM MRN: [id number] Date of Birth: January 01, 1946 Attending MD: Hennie Duos. Maple Mirza, [id number] CSN: [id number] Age: 78 Admit Type: Outpatient Procedure:                Upper GI endoscopy Indications:              Iron deficiency anemia, Early satiety, Nausea Providers:                Hennie Duos. Marletta Lor, DO, Buel Ream. Thomasena Edis RN, RN,                            Lennice Sites Technician, Technician Referring MD:              Medicines:                See the Anesthesia note for documentation of the                            administered medications Complications:            No immediate complications. Estimated Blood Loss:     Estimated blood loss was minimal. Procedure:                Pre-Anesthesia Assessment:                           - The anesthesia plan was to use monitored                            anesthesia care (MAC).                           After obtaining informed consent, the endoscope was                            passed under direct vision. Throughout the                            procedure, the patient's blood pressure, pulse, and                            oxygen saturations were monitored continuously. The                            [id number]-H190 ([id number]) scope was introduced through the                            mouth, and advanced to the second part of duodenum.                            The upper GI endoscopy was accomplished without                            difficulty. The patient tolerated the procedure  well. Scope In: 1:28:15 PM Scope Out: 1:33:06 PM Total Procedure Duration: 0 hours 4 minutes 51 seconds  Findings:      The Z-line was regular.      Patchy mild inflammation characterized by erythema was found in the       gastric body. Biopsies were taken with a cold forceps for Helicobacter       pylori testing.      Multiple small likely fundic gland  polyps with no bleeding and no       stigmata of recent bleeding were found in the gastric fundus and in the       gastric body. Biopsies were taken with a cold forceps for histology.      Diffuse mucosal variance characterized by white specks was found in the       second portion of the duodenum. Biopsies for histology were taken with a       cold forceps for evaluation of celiac disease. Impression:               - Z-line regular.                           - Gastritis. Biopsied.                           - Multiple gastric polyps. Biopsied.                           - Mucosal variant in the duodenum. Biopsied. Moderate Sedation:      Per Anesthesia Care Recommendation:           - Patient has a contact number available for                            emergencies. The signs and symptoms of potential                            delayed complications were discussed with the                            patient. Return to normal activities tomorrow.                            Written discharge instructions were provided to the                            patient.                           - Resume previous diet.                           - Continue present medications.                           - Await pathology results. Procedure Code(s):        --- Professional ---                           [phone],  Esophagogastroduodenoscopy, flexible,                            transoral; with biopsy, single or multiple Diagnosis Code(s):        --- Professional ---                           K29.70, Gastritis, unspecified, without bleeding                           K31.7, Polyp of stomach and duodenum                           K31.89, Other diseases of stomach and duodenum                           D50.9, Iron deficiency anemia, unspecified                           R68.81, Early satiety                           R11.0, Nausea CPT copyright 2022 American Medical Association. All rights reserved. The codes  documented in this report are preliminary and upon coder review may  be revised to meet current compliance requirements. Hennie Duos. Marletta Lor, DO Hennie Duos. Marletta Lor, DO 06/30/2023 1:36:13 PM This report has been signed electronically. Number of Addenda: 0

## 2023-06-30 NOTE — Anesthesia Preprocedure Evaluation (Signed)
Anesthesia Evaluation  Patient identified by MRN, date of birth, ID band Patient awake    Reviewed: Allergy & Precautions, H&P , NPO status , Patient's Chart, lab work & pertinent test results, reviewed documented beta blocker date and time   History of Anesthesia Complications (+) PONV and history of anesthetic complications  Airway Mallampati: II  TM Distance: >3 FB Neck ROM: full    Dental no notable dental hx.    Pulmonary neg pulmonary ROS, shortness of breath, sleep apnea , pneumonia   Pulmonary exam normal breath sounds clear to auscultation       Cardiovascular Exercise Tolerance: Good hypertension, + angina  + CAD and +CHF  negative cardio ROS + dysrhythmias + pacemaker  Rhythm:regular Rate:Normal     Neuro/Psych negative neurological ROS  negative psych ROS   GI/Hepatic negative GI ROS, Neg liver ROS,GERD  ,,  Endo/Other  negative endocrine ROSdiabetes Hyperthyroidism   Renal/GU negative Renal ROS  negative genitourinary   Musculoskeletal   Abdominal   Peds  Hematology negative hematology ROS (+)   Anesthesia Other Findings   Reproductive/Obstetrics negative OB ROS                             Anesthesia Physical Anesthesia Plan  ASA: 3  Anesthesia Plan: General   Post-op Pain Management:    Induction:   PONV Risk Score and Plan: Propofol infusion  Airway Management Planned:   Additional Equipment:   Intra-op Plan:   Post-operative Plan:   Informed Consent: I have reviewed the patients History and Physical, chart, labs and discussed the procedure including the risks, benefits and alternatives for the proposed anesthesia with the patient or authorized representative who has indicated his/her understanding and acceptance.     Dental Advisory Given  Plan Discussed with: CRNA  Anesthesia Plan Comments:        Anesthesia Quick Evaluation

## 2023-07-01 ENCOUNTER — Encounter (HOSPITAL_COMMUNITY): Payer: Self-pay | Admitting: Internal Medicine

## 2023-07-01 NOTE — Telephone Encounter (Signed)
Please reach Dr. Marletta Lor to address Eliquis. Will need to reach him directly rather than through this MyChart patient message.

## 2023-07-02 ENCOUNTER — Telehealth: Payer: Self-pay

## 2023-07-02 DIAGNOSIS — M47816 Spondylosis without myelopathy or radiculopathy, lumbar region: Secondary | ICD-10-CM | POA: Diagnosis not present

## 2023-07-02 LAB — SURGICAL PATHOLOGY

## 2023-07-02 NOTE — Telephone Encounter (Signed)
Message left on clinical intake voicemail:   Neurologist told patient to follow-up with PCP in regards to getting pain medication for back pain.  Please advise

## 2023-07-03 ENCOUNTER — Telehealth: Payer: Self-pay

## 2023-07-03 MED ORDER — OXYCODONE-ACETAMINOPHEN 5-325 MG PO TABS: 1.0000 | ORAL_TABLET | Freq: Three times a day (TID) | ORAL | 0 refills | Status: AC | PRN

## 2023-07-03 NOTE — Telephone Encounter (Signed)
Discussed with the patient.

## 2023-07-03 NOTE — Telephone Encounter (Signed)
[name], MD  You1 minute ago (10:50 AM)    Can we get the records from neurosurgery, thanks.    Neurology note is available in epic under the chart review tab, see note from yesterday Dr.Nundkumar.

## 2023-07-03 NOTE — Telephone Encounter (Signed)
Patient states she is still having very bad side pain on the left side and her pain level is at a 9 at this time. Patient would like to know if she could get some pain medication due or go to the ED for pain.

## 2023-07-03 NOTE — Telephone Encounter (Signed)
Dr.Nundkumar, could you have someone at your office fax [place] your full note [phone] (Fax) or if possible could you route it via Epic to Dr.Veludandi

## 2023-07-05 NOTE — Anesthesia Postprocedure Evaluation (Signed)
Anesthesia Post Note  Patient: Veria H Dewalt  Procedure(s) Performed: COLONOSCOPY WITH PROPOFOL ESOPHAGOGASTRODUODENOSCOPY (EGD) WITH PROPOFOL BIOPSY POLYPECTOMY  Patient location during evaluation: Phase II Anesthesia Type: General Level of consciousness: awake Pain management: pain level controlled Vital Signs Assessment: post-procedure vital signs reviewed and stable Respiratory status: spontaneous breathing and respiratory function stable Cardiovascular status: blood pressure returned to baseline and stable Postop Assessment: no headache and no apparent nausea or vomiting Anesthetic complications: no Comments: Late entry   No notable events documented.   Last Vitals:  Vitals:   06/30/23 1035 06/30/23 1405  BP: (!) 142/57 128/73  Pulse: 71 64  Resp: 18 (!) 22  Temp: 36.8 C (!) 36.3 C  SpO2: 100% 100%    Last Pain:  Vitals:   07/01/23 1031  TempSrc:   PainSc: 0-No pain                 Windell Norfolk

## 2023-07-08 ENCOUNTER — Telehealth: Payer: Self-pay

## 2023-07-08 DIAGNOSIS — H26492 Other secondary cataract, left eye: Secondary | ICD-10-CM | POA: Diagnosis not present

## 2023-07-08 DIAGNOSIS — G8929 Other chronic pain: Secondary | ICD-10-CM

## 2023-07-08 NOTE — Telephone Encounter (Signed)
Spoke with daughter and inform that pt needs to take tylenol 1000mg  tid and oxycodone 1/2 to 1 tab as needed for pain,  Limit oxycodone as possible and only to take for severe pain  Pain referral placed

## 2023-07-08 NOTE — Telephone Encounter (Signed)
 Patient daughter called and states that she really needs to speak to you about her mother alone. If you could give her a call quickly for a brief one on one discussion about oxycodone , and retail buyer. She's requesting a call before 9:30am because she has to work. Message routed to PCP Sherlynn Madden, MD.

## 2023-07-10 ENCOUNTER — Other Ambulatory Visit: Payer: Self-pay | Admitting: Cardiovascular Disease

## 2023-07-14 ENCOUNTER — Telehealth: Payer: Self-pay | Admitting: *Deleted

## 2023-07-14 NOTE — Telephone Encounter (Signed)
 Bionca with [facility] called and stated that the Dr's there have Declined the Referral for patient. Stated that patient was seen before and it was not believed to be Spine related.  They recommend patient seeing Orthopaedic.   They are requesting you to place referral for Ortho.

## 2023-07-16 NOTE — Telephone Encounter (Signed)
Hailey Sheffield, MD  You1 hour ago (1:14 PM)    She needs to see pain clinic, referral placed.     Tried calling patient to inform, No Answer and could not leave VM.

## 2023-07-18 DIAGNOSIS — Z961 Presence of intraocular lens: Secondary | ICD-10-CM | POA: Diagnosis not present

## 2023-07-18 DIAGNOSIS — Z9842 Cataract extraction status, left eye: Secondary | ICD-10-CM | POA: Diagnosis not present

## 2023-07-18 DIAGNOSIS — H2512 Age-related nuclear cataract, left eye: Secondary | ICD-10-CM | POA: Diagnosis not present

## 2023-08-01 ENCOUNTER — Other Ambulatory Visit: Payer: Self-pay | Admitting: Cardiovascular Disease

## 2023-08-01 NOTE — Telephone Encounter (Signed)
 Prescription refill request for Eliquis received. Indication:afib Last office visit:12/24 Scr:1.13  12/24 Age: 78 Weight:53.6  kg  Prescription refilled

## 2023-08-04 NOTE — Progress Notes (Signed)
 History of Present Illness: Hailey [name] is a 78 y.o. year old female here today for cystoscopy.  1 episode of microscopic hematuria in the past.  She does have a 3 mm right lower pole stone seen on prior CT scan.  She does have lower urinary tract symptoms.,  Complaint is being incomplete emptying.  Bladder scan volumes have been low/normal for her age.  Past Medical History:  Diagnosis Date   Acute on chronic diastolic CHF (congestive heart failure), NYHA class 1 (HCC) 04/29/2013   Allergic rhinitis    Brady-tachy syndrome (HCC)    s/p STJ dual chamber PPM by Dr [name]   CAD (coronary artery disease)    70% RCA stenosis, treated medically   Control of atrial fibrillation with pacemaker (HCC)    Diabetes mellitus (HCC)    type 2   DJD (degenerative joint disease)    Dyspnea    occasional   Hearing loss    Right ear only - no hearing aids   Hypertension    Hyperthyroidism    Paroxysmal atrial fibrillation (HCC)    Pacemaker St Jude   Pneumonia    years ago- before 2012 per patient   Polio    as a child   PONV (postoperative nausea and vomiting)    Presence of permanent cardiac pacemaker    St Jude pacemaker   Pulmonary disease    Sleep apnea    uses  CPAP nightly    Past Surgical History:  Procedure Laterality Date   arm surgery d/t fx Right 1996   ATRIAL FIBRILLATION ABLATION  07/31/2012; 08-10-13   PVI by Dr [name]   ATRIAL FIBRILLATION ABLATION N/A 07/31/2012   Procedure: ATRIAL FIBRILLATION ABLATION;  Surgeon: Hillis Range, MD;  Location: [facility] CATH LAB;  Service: Cardiovascular;  Laterality: N/A;   ATRIAL FIBRILLATION ABLATION N/A 08/10/2013   Procedure: ATRIAL FIBRILLATION ABLATION;  Surgeon: Gardiner Rhyme, MD;  Location: [facility] CATH LAB;  Service: Cardiovascular;  Laterality: N/A;   BACK SURGERY  07/26/2012   dR. nUDELMAN   BIOPSY  06/30/2023   Procedure: BIOPSY;  Surgeon: Lanelle Bal, DO;  Location: [facility] ENDO SUITE;  Service: Endoscopy;;   CARDIAC  CATHETERIZATION  12/03/2010   single vessel mid RCA   CARDIAC ELECTROPHYSIOLOGY MAPPING AND ABLATION  07/31/2012   Dr. Hillis Range   CESAREAN SECTION  09/20/1974   Dr. Greta Doom   CHOLECYSTECTOMY  1994   COLONOSCOPY WITH PROPOFOL N/A 03/21/2015   Procedure: COLONOSCOPY WITH PROPOFOL;  Surgeon: Charna Elizabeth, MD;  Location: [facility] ENDOSCOPY;  Service: Endoscopy;  Laterality: N/A;   COLONOSCOPY WITH PROPOFOL N/A 06/30/2023   Procedure: COLONOSCOPY WITH PROPOFOL;  Surgeon: Lanelle Bal, DO;  Location: [facility] ENDO SUITE;  Service: Endoscopy;  Laterality: N/A;  11:15 AM, ASA 4   DIAGNOSTIC MAMMOGRAM  04/16/2017   Tylene Fantasia. Morris, DO   ESOPHAGOGASTRODUODENOSCOPY (EGD) WITH PROPOFOL N/A 06/30/2023   Procedure: ESOPHAGOGASTRODUODENOSCOPY (EGD) WITH PROPOFOL;  Surgeon: Lanelle Bal, DO;  Location: [facility] ENDO SUITE;  Service: Endoscopy;  Laterality: N/A;  11:15 AM, ASA 4   EYE SURGERY Bilateral    remove cataracts   LAPAROSCOPIC HYSTERECTOMY  Late 70s to early 80s   LEFT HEART CATH AND CORONARY ANGIOGRAPHY N/A 07/30/2017   Procedure: LEFT HEART CATH AND CORONARY ANGIOGRAPHY;  Surgeon: Lennette Bihari, MD;  Location: [facility] INVASIVE CV LAB;  Service: Cardiovascular;  Laterality: N/A;   LUMBAR LAMINECTOMY/DECOMPRESSION MICRODISCECTOMY Right 12/23/2012   Procedure: Right L5-S1 Laminotomy for resection  of synovial cyst;  Surgeon: [name], MD;  Location: [facility] NEURO ORS;  Service: Neurosurgery;  Laterality: Right;  Right Lumbar five-sacral one Laminotomy for resection of synovial cyst   NM MYOCAR PERF WALL MOTION  12/20/2010   normal   PACEMAKER IMPLANT N/A 08/09/2019   Procedure: PACEMAKER IMPLANT;  Surgeon: [name], MD;  Location: [facility] INVASIVE CV LAB;  Service: Cardiovascular;  Laterality: N/A;   PACEMAKER INSERTION  12/03/2010   SJM Accent DR RF implanted by Dr [name]   POLYPECTOMY  06/30/2023   Procedure: POLYPECTOMY;  Surgeon: Lanelle Bal, DO;  Location: [facility] ENDO SUITE;  Service:  Endoscopy;;   RIGHT HEART CATH N/A 04/22/2018   Procedure: RIGHT HEART CATH;  Surgeon: Iran Ouch, MD;  Location: [facility] INVASIVE CV LAB;  Service: Cardiovascular;  Laterality: N/A;   RIGHT HEART CATH N/A 07/09/2022   Procedure: RIGHT HEART CATH;  Surgeon: Dolores Patty, MD;  Location: [facility] INVASIVE CV LAB;  Service: Cardiovascular;  Laterality: N/A;   TEE WITHOUT CARDIOVERSION N/A 07/30/2012   Procedure: TRANSESOPHAGEAL ECHOCARDIOGRAM (TEE);  Surgeon: [name], MD;  Location: [facility] ENDOSCOPY;  Service: Cardiovascular;  Laterality: N/A;  h&p in file-hope   TEE WITHOUT CARDIOVERSION N/A 08/09/2013   Procedure: TRANSESOPHAGEAL ECHOCARDIOGRAM (TEE);  Surgeon: Lewayne Bunting, MD;  Location: [facility] ENDOSCOPY;  Service: Cardiovascular;  Laterality: N/A;   THYROID LOBECTOMY Right 09/20/2022   Procedure: RIGHT HEMITHYROIDECTOMY;  Surgeon: Laren Boom, DO;  Location: [facility] OR;  Service: ENT;  Laterality: Right;   TOTAL ABDOMINAL HYSTERECTOMY  1981   ULTRASOUND GUIDANCE FOR VASCULAR ACCESS  04/22/2018   Procedure: Ultrasound Guidance For Vascular Access;  Surgeon: Iran Ouch, MD;  Location: [facility] INVASIVE CV LAB;  Service: Cardiovascular;;    Home Medications:  (Not in a hospital admission)   Allergies:  Allergies  Allergen Reactions   Codeine Nausea And Vomiting and Nausea Only   Demerol Hcl [Meperidine] Nausea Only   Ketorolac Nausea And Vomiting, Nausea Only and Other (See Comments)    "pt told to never take this med"   Demerol Nausea And Vomiting   Multaq [Dronedarone] Other (See Comments)    Increased fluid retention   Flexeril [Cyclobenzaprine] Nausea And Vomiting   Xarelto [Rivaroxaban] Diarrhea   Latex Rash    Pt stated on 09/20/2022    Family History  Problem Relation Age of Onset   Cancer Brother    Heart attack Sister    Breast cancer Sister    Diabetes Sister    Heart disease Sister    Breast cancer Sister    Dementia Sister    Colon cancer Sister     Heart disease Sister    Breast cancer Sister    Suicidality Brother    Prostate cancer Brother    Dementia Brother     Social History:  reports that she has never smoked. She has never used smokeless tobacco. She reports that she does not drink alcohol and does not use drugs.  ROS: A complete review of systems was performed.  All systems are negative except for pertinent findings as noted.  Physical Exam:  Vital signs in last 24 hours: @VSRANGES @ General:  Alert and oriented, No acute distress HEENT: Normocephalic, atraumatic Neck: No JVD or lymphadenopathy Cardiovascular: Regular rate  Lungs: Normal inspiratory/expiratory excursion Extremities: No edema Neurologic: Grossly intact  I have reviewed prior pt notes  I have reviewed prior urinalysis results  Prior physicians notes reviewed  I have independently reviewed  prior imaging-prior CT images reviewed  I have reviewed prior urine culture    Impression/Assessment:  -History of microscopic hematuria, only 1 episode in the past several years.  Urine today is clear.  Low risk individual for urothelial carcinomas.  -Left-sided abdominal pain, not of GU etiology  -3 mm right lower pole stone, no treatment needed  Plan:  -I told the patient that she does not need cystoscopy  -Reassurance regarding right renal stone  -Office visit as needed  Chelsea Aus 08/04/2023, 8:53 AM  Bertram Millard. Jmya Uliano MD

## 2023-08-05 ENCOUNTER — Ambulatory Visit: Payer: Medicare Other | Admitting: Urology

## 2023-08-05 VITALS — BP 143/75 | HR 65

## 2023-08-05 DIAGNOSIS — N200 Calculus of kidney: Secondary | ICD-10-CM

## 2023-08-05 DIAGNOSIS — R3129 Other microscopic hematuria: Secondary | ICD-10-CM

## 2023-08-06 ENCOUNTER — Ambulatory Visit (INDEPENDENT_AMBULATORY_CARE_PROVIDER_SITE_OTHER): Payer: Medicare Other

## 2023-08-06 DIAGNOSIS — I495 Sick sinus syndrome: Secondary | ICD-10-CM

## 2023-08-06 LAB — URINALYSIS, ROUTINE W REFLEX MICROSCOPIC
Bilirubin, UA: NEGATIVE
Glucose, UA: NEGATIVE
Ketones, UA: NEGATIVE
Nitrite, UA: POSITIVE — AB
Protein,UA: NEGATIVE
Specific Gravity, UA: 1.01 (ref 1.005–1.030)
Urobilinogen, Ur: 2 mg/dL — ABNORMAL HIGH (ref 0.2–1.0)
pH, UA: 6 (ref 5.0–7.5)

## 2023-08-06 LAB — MICROSCOPIC EXAMINATION

## 2023-08-08 LAB — CUP PACEART REMOTE DEVICE CHECK
Battery Remaining Longevity: 97 mo
Battery Remaining Percentage: 69 %
Battery Voltage: 2.99 V
Brady Statistic RV Percent Paced: 95 %
Date Time Interrogation Session: 20250305040015
Implantable Lead Connection Status: 753985
Implantable Lead Connection Status: 753985
Implantable Lead Connection Status: 753985
Implantable Lead Implant Date: 20120702
Implantable Lead Implant Date: 20120702
Implantable Lead Implant Date: 20210308
Implantable Lead Location: 753859
Implantable Lead Location: 753860
Implantable Lead Location: 753860
Implantable Pulse Generator Implant Date: 20210308
Lead Channel Impedance Value: 580 Ohm
Lead Channel Pacing Threshold Amplitude: 0.5 V
Lead Channel Pacing Threshold Pulse Width: 0.5 ms
Lead Channel Sensing Intrinsic Amplitude: 10.2 mV
Lead Channel Setting Pacing Amplitude: 0.75 V
Lead Channel Setting Pacing Pulse Width: 0.5 ms
Lead Channel Setting Sensing Sensitivity: 2 mV
Pulse Gen Model: 2272
Pulse Gen Serial Number: 3801383

## 2023-08-09 NOTE — Progress Notes (Unsigned)
 Referring Provider: [name], * Primary Care Physician:  [name], MD Primary GI Physician: Dr. Marletta Lor  No chief complaint on file.   HPI:   Hailey [name] is a 78 y.o. female presenting today for follow-up of left lower quadrant abdominal pain and iron deficiency anemia.  Patient last seen in the office at the time of initial consult 06/02/2023.  She reported left lower quadrant abdominal pain radiating to her back x 1 month.  Noted if she was up and walking, her pain got better.  Woke her up at night and stated when she stood up to get out of bed, pain will be so severe she would almost pass out, but would improve as she was walking around.  Some worsening with meals.  Slight improvement with a bowel movement.  She is taking Senokot as needed and MiraLAX daily with bowels not emptying well.  Reported prior diarrhea, taking 1 Imodium daily, but change in bowel habits came on fairly suddenly in the last few months.  Also with early satiety, nausea which was chronic.  No GERD symptoms on pantoprazole.  No overt GI bleeding. Noted new onset IDA with anemia starting November 2024.  Recommended EGD, colonoscopy, Linzess 145 mcg daily, continue Protonix, iron daily.  Pantoprazole later increased to 40 mg BID, Linzess ultimately stopped due to diarrhea and recommended MiraLAX 1-2 times daily, and repeat CT completed 06/19/23 due to reports of worsening pain with no acute abnormalities. She did have multilevel degenerative change of the spine. Degenerative change of the right-greater-than-left hips. Degenerative change of the bilateral SI joints. Chronic osseous changes of the pubic symphysis. Recommended seeing PCP/ortho about these abnormalities as they were likely contributing to her LLQ pain.   EGD 06/30/23: Gastritis biopsied, multiple gastric polyps biopsied, mucosal variant in duodenum biopsied.  Pathology showed fundic gland polyps, reactive gastropathy, negative for H.  pylori, normal duodenum.  Colonoscopy 06/30/23: Nonbleeding internal hemorrhoids, five 4-13 mm polyps in the transverse colon resected and retrieved, medium sized lipoma in the transverse colon and in proximal transverse colon, diverticulosis in the sigmoid colon.  Pathology showed multiple fragments of tubular adenoma.   Today:  LLQ pain:   IDA:   Early satiety/nausea:   Past Medical History:  Diagnosis Date   Acute on chronic diastolic CHF (congestive heart failure), NYHA class 1 (HCC) 04/29/2013   Allergic rhinitis    Brady-tachy syndrome (HCC)    s/p STJ dual chamber PPM by Dr [name]   CAD (coronary artery disease)    70% RCA stenosis, treated medically   Control of atrial fibrillation with pacemaker (HCC)    Diabetes mellitus (HCC)    type 2   DJD (degenerative joint disease)    Dyspnea    occasional   Hearing loss    Right ear only - no hearing aids   Hypertension    Hyperthyroidism    Paroxysmal atrial fibrillation (HCC)    Pacemaker St Jude   Pneumonia    years ago- before 2012 per patient   Polio    as a child   PONV (postoperative nausea and vomiting)    Presence of permanent cardiac pacemaker    St Jude pacemaker   Pulmonary disease    Sleep apnea    uses  CPAP nightly    Past Surgical History:  Procedure Laterality Date   arm surgery d/t fx Right 1996   ATRIAL FIBRILLATION ABLATION  07/31/2012; 08-10-13   PVI by Dr [name]  ATRIAL FIBRILLATION ABLATION N/A 07/31/2012   Procedure: ATRIAL FIBRILLATION ABLATION;  Surgeon: Hillis Range, MD;  Location: [facility] CATH LAB;  Service: Cardiovascular;  Laterality: N/A;   ATRIAL FIBRILLATION ABLATION N/A 08/10/2013   Procedure: ATRIAL FIBRILLATION ABLATION;  Surgeon: Gardiner Rhyme, MD;  Location: [facility] CATH LAB;  Service: Cardiovascular;  Laterality: N/A;   BACK SURGERY  07/26/2012   dR. nUDELMAN   BIOPSY  06/30/2023   Procedure: BIOPSY;  Surgeon: Lanelle Bal, DO;  Location: [facility] ENDO SUITE;  Service: Endoscopy;;    CARDIAC CATHETERIZATION  12/03/2010   single vessel mid RCA   CARDIAC ELECTROPHYSIOLOGY MAPPING AND ABLATION  07/31/2012   Dr. Hillis Range   CESAREAN SECTION  09/20/1974   Dr. Greta Doom   CHOLECYSTECTOMY  1994   COLONOSCOPY WITH PROPOFOL N/A 03/21/2015   Procedure: COLONOSCOPY WITH PROPOFOL;  Surgeon: Charna Elizabeth, MD;  Location: [facility] ENDOSCOPY;  Service: Endoscopy;  Laterality: N/A;   COLONOSCOPY WITH PROPOFOL N/A 06/30/2023   Procedure: COLONOSCOPY WITH PROPOFOL;  Surgeon: Lanelle Bal, DO;  Location: [facility];  Service: Endoscopy;  Laterality: N/A;  11:15 AM, ASA 4   DIAGNOSTIC MAMMOGRAM  04/16/2017   Tylene Fantasia. Morris, DO   ESOPHAGOGASTRODUODENOSCOPY (EGD) WITH PROPOFOL N/A 06/30/2023   Procedure: ESOPHAGOGASTRODUODENOSCOPY (EGD) WITH PROPOFOL;  Surgeon: Lanelle Bal, DO;  Location: [facility];  Service: Endoscopy;  Laterality: N/A;  11:15 AM, ASA 4   EYE SURGERY Bilateral    remove cataracts   LAPAROSCOPIC HYSTERECTOMY  Late 70s to early 80s   LEFT HEART CATH AND CORONARY ANGIOGRAPHY N/A 07/30/2017   Procedure: LEFT HEART CATH AND CORONARY ANGIOGRAPHY;  Surgeon: Lennette Bihari, MD;  Location: [facility] INVASIVE CV LAB;  Service: Cardiovascular;  Laterality: N/A;   LUMBAR LAMINECTOMY/DECOMPRESSION MICRODISCECTOMY Right 12/23/2012   Procedure: Right L5-S1 Laminotomy for resection of synovial cyst;  Surgeon: [name], MD;  Location: [facility] NEURO ORS;  Service: Neurosurgery;  Laterality: Right;  Right Lumbar five-sacral one Laminotomy for resection of synovial cyst   NM MYOCAR PERF WALL MOTION  12/20/2010   normal   PACEMAKER IMPLANT N/A 08/09/2019   Procedure: PACEMAKER IMPLANT;  Surgeon: [name], MD;  Location: [facility] INVASIVE CV LAB;  Service: Cardiovascular;  Laterality: N/A;   PACEMAKER INSERTION  12/03/2010   SJM Accent DR RF implanted by Dr [name]   POLYPECTOMY  06/30/2023   Procedure: POLYPECTOMY;  Surgeon: Lanelle Bal, DO;  Location: [facility];  Service:  Endoscopy;;   RIGHT HEART CATH N/A 04/22/2018   Procedure: RIGHT HEART CATH;  Surgeon: Iran Ouch, MD;  Location: [facility] INVASIVE CV LAB;  Service: Cardiovascular;  Laterality: N/A;   RIGHT HEART CATH N/A 07/09/2022   Procedure: RIGHT HEART CATH;  Surgeon: Dolores Patty, MD;  Location: [facility] INVASIVE CV LAB;  Service: Cardiovascular;  Laterality: N/A;   TEE WITHOUT CARDIOVERSION N/A 07/30/2012   Procedure: TRANSESOPHAGEAL ECHOCARDIOGRAM (TEE);  Surgeon: [name], MD;  Location: [facility] ENDOSCOPY;  Service: Cardiovascular;  Laterality: N/A;  h&p in file-hope   TEE WITHOUT CARDIOVERSION N/A 08/09/2013   Procedure: TRANSESOPHAGEAL ECHOCARDIOGRAM (TEE);  Surgeon: Lewayne Bunting, MD;  Location: [facility] ENDOSCOPY;  Service: Cardiovascular;  Laterality: N/A;   THYROID LOBECTOMY Right 09/20/2022   Procedure: RIGHT HEMITHYROIDECTOMY;  Surgeon: Laren Boom, DO;  Location: [facility] OR;  Service: ENT;  Laterality: Right;   TOTAL ABDOMINAL HYSTERECTOMY  1981   ULTRASOUND GUIDANCE FOR VASCULAR ACCESS  04/22/2018   Procedure: Ultrasound Guidance For Vascular  Access;  Surgeon: Iran Ouch, MD;  Location: [facility] INVASIVE CV LAB;  Service: Cardiovascular;;    Current Outpatient Medications  Medication Sig Dispense Refill   acetaminophen (TYLENOL) 500 MG tablet Take 500-1,000 mg by mouth every 6 (six) hours as needed (pain.).     apixaban (ELIQUIS) 5 MG TABS tablet TAKE ONE TABLET BY MOUTH TWICE DAILY 60 tablet 5   Cholecalciferol (VITAMIN D3) 1000 UNITS CAPS Take 1,000 Units by mouth in the morning.     fluticasone (FLONASE) 50 MCG/ACT nasal spray Place 2 sprays into both nostrils daily. 16 g 6   furosemide (LASIX) 80 MG tablet Take 40 mg by mouth in the morning and at bedtime. 1/2 tablet morning and night     glucose blood (ONETOUCH ULTRA) test strip USE DAILY TO CHECK BLOOD SUGAR E11.8 100 each 11   Lancets Misc. (ONE TOUCH SURESOFT) MISC Use to test  Blood sugar once daily DX: E11.8, 1 each 0    levothyroxine (SYNTHROID) 25 MCG tablet Take 25 mcg by mouth daily before breakfast.     lidocaine (HM LIDOCAINE PATCH) 4 % Place 1 patch onto the skin daily. 30 patch 1   linaclotide (LINZESS) 72 MCG capsule Take 1 capsule (72 mcg total) by mouth daily before breakfast. 30 capsule 3   magnesium oxide (MAG-OX) 400 (240 Mg) MG tablet Take 400 mg by mouth daily.     metoprolol tartrate (LOPRESSOR) 25 MG tablet Take 12.5 mg by mouth in the morning and at bedtime.     ondansetron (ZOFRAN) 4 MG tablet Take 1 tablet (4 mg total) by mouth every 8 (eight) hours as needed for nausea or vomiting. 20 tablet 0   pantoprazole (PROTONIX) 40 MG tablet Take 1 tablet (40 mg total) by mouth 2 (two) times daily before a meal. 60 tablet 3   potassium chloride SA (KLOR-CON M) 20 MEQ tablet TAKE ONE TABLET BY MOUTH TWICE DAILY 180 tablet 1   predniSONE (DELTASONE) 20 MG tablet Take 2 tablets (40 mg total) by mouth daily with breakfast. 5 tablet 0   sildenafil (REVATIO) 20 MG tablet TAKE ONE TABLET BY MOUTH 3 TIMES DAILY 90 tablet 6   Sod Picosulfate-Mag Ox-Cit Acd (CLENPIQ) 10-3.5-12 MG-GM -GM/175ML SOLN Take 1 kit by mouth as directed. 350 mL 0   spironolactone (ALDACTONE) 25 MG tablet Take 1 tablet (25 mg total) by mouth daily. 90 tablet 3   No current facility-administered medications for this visit.    Allergies as of 08/11/2023 - Review Complete 06/30/2023  Allergen Reaction Noted   Codeine Nausea And Vomiting and Nausea Only 01/29/2011   Demerol hcl [meperidine] Nausea Only    Ketorolac Nausea And Vomiting, Nausea Only, and Other (See Comments) 01/29/2011   Demerol Nausea And Vomiting 01/29/2011   Multaq [dronedarone] Other (See Comments) 08/05/2013   Flexeril [cyclobenzaprine] Nausea And Vomiting 02/20/2017   Xarelto [rivaroxaban] Diarrhea 03/08/2023   Latex Rash 09/20/2022    Family History  Problem Relation Age of Onset   Cancer Brother    Heart attack Sister    Breast cancer Sister    Diabetes  Sister    Heart disease Sister    Breast cancer Sister    Dementia Sister    Colon cancer Sister    Heart disease Sister    Breast cancer Sister    Suicidality Brother    Prostate cancer Brother    Dementia Brother     Social History   Socioeconomic History   Marital  status: Married    Spouse name: Loistine Chance   Number of children: 3   Years of education: 13   Highest education level: Some college, no degree  Occupational History   Occupation: housewife  Tobacco Use   Smoking status: Never   Smokeless tobacco: Never  Vaping Use   Vaping status: Never Used  Substance and Sexual Activity   Alcohol use: No   Drug use: No   Sexual activity: Not Currently    Birth control/protection: Abstinence, Surgical    Comment: Hysterectomy  Other Topics Concern   Not on file  Social History Narrative   Lives in [place].  Retired.  Was office clerk in several companies.  Did some technical school after completing high school.  She has no pets.  She and her husband live in a one-story home.  They've been married since 1965.  She follows a heart healthy diet.  She has a living will and hcpoa but does not have a DNR form.  She does struggle to prepare food.     Social Drivers of Corporate investment banker Strain: Not on file  Food Insecurity: Low Risk  (11/11/2022)   [facility]   Hunger Vital Sign    Worried About Running Out of Food in the Last Year: Never true    Ran Out of Food in the Last Year: Never true  Transportation Needs: No Transportation Needs (09/20/2022)   PRAPARE - Administrator, Civil Service (Medical): No    Lack of Transportation (Non-Medical): No  Physical Activity: Not on file  Stress: Not on file  Social Connections: Not on file    Review of Systems: Gen: Denies fever, chills, anorexia. Denies fatigue, weakness, weight loss.  CV: Denies chest pain, palpitations, syncope, peripheral edema, and claudication. Resp:  Denies dyspnea at rest, cough, wheezing, coughing up blood, and pleurisy. GI: Denies vomiting blood, jaundice, and fecal incontinence.   Denies dysphagia or odynophagia. Derm: Denies rash, itching, dry skin Psych: Denies depression, anxiety, memory loss, confusion. No homicidal or suicidal ideation.  Heme: Denies bruising, bleeding, and enlarged lymph nodes.  Physical Exam: There were no vitals taken for this visit. General:   Alert and oriented. No distress noted. Pleasant and cooperative.  Head:  Normocephalic and atraumatic. Eyes:  Conjuctiva clear without scleral icterus. Heart:  S1, S2 present without murmurs appreciated. Lungs:  Clear to auscultation bilaterally. No wheezes, rales, or rhonchi. No distress.  Abdomen:  +BS, soft, non-tender and non-distended. No rebound or guarding. No HSM or masses noted. Msk:  Symmetrical without gross deformities. Normal posture. Extremities:  Without edema. Neurologic:  Alert and  oriented x4 Psych:  Normal mood and affect.    Assessment:     Plan:  ***   Ermalinda Memos, PA-C [facility] 08/11/2023

## 2023-08-10 ENCOUNTER — Encounter: Payer: Self-pay | Admitting: Cardiovascular Disease

## 2023-08-11 ENCOUNTER — Encounter: Payer: Self-pay | Admitting: Gastroenterology

## 2023-08-11 ENCOUNTER — Ambulatory Visit (INDEPENDENT_AMBULATORY_CARE_PROVIDER_SITE_OTHER): Payer: Medicare Other | Admitting: Gastroenterology

## 2023-08-11 VITALS — BP 130/79 | HR 76 | Temp 98.1°F | Ht 59.0 in | Wt 116.8 lb

## 2023-08-11 DIAGNOSIS — R110 Nausea: Secondary | ICD-10-CM

## 2023-08-11 DIAGNOSIS — K5900 Constipation, unspecified: Secondary | ICD-10-CM | POA: Diagnosis not present

## 2023-08-11 DIAGNOSIS — Z86018 Personal history of other benign neoplasm: Secondary | ICD-10-CM

## 2023-08-11 DIAGNOSIS — Z860101 Personal history of adenomatous and serrated colon polyps: Secondary | ICD-10-CM

## 2023-08-11 DIAGNOSIS — R6881 Early satiety: Secondary | ICD-10-CM

## 2023-08-11 DIAGNOSIS — Z8719 Personal history of other diseases of the digestive system: Secondary | ICD-10-CM

## 2023-08-11 DIAGNOSIS — K219 Gastro-esophageal reflux disease without esophagitis: Secondary | ICD-10-CM

## 2023-08-11 DIAGNOSIS — D509 Iron deficiency anemia, unspecified: Secondary | ICD-10-CM | POA: Diagnosis not present

## 2023-08-11 DIAGNOSIS — R1032 Left lower quadrant pain: Secondary | ICD-10-CM | POA: Diagnosis not present

## 2023-08-11 NOTE — Patient Instructions (Addendum)
 Have labs in [facility].   Follow-up with your primary care doctor and pain management as you are for back and low abdominal pain.   Continue pantoprazole 40mg  BID.   Gastroparesis recommendations:  4-6 small meals daily Low fat diet Low fiber diet (avoid raw fruits and vegetables).  Continue MiraLAX 17 g daily. OK to decrease to 1/2 capful if needed.   Avoid imodium as much as possible.    I will plan to see you back in 3 months or sooner if needed.    Ermalinda Memos, PA-C [facility]

## 2023-08-12 ENCOUNTER — Ambulatory Visit: Payer: Medicare Other | Admitting: Sports Medicine

## 2023-08-12 LAB — CBC WITH DIFFERENTIAL/PLATELET
Basophils Absolute: 0.1 10*3/uL (ref 0.0–0.2)
Basos: 1 %
EOS (ABSOLUTE): 0.1 10*3/uL (ref 0.0–0.4)
Eos: 1 %
Hematocrit: 35.4 % (ref 34.0–46.6)
Hemoglobin: 10.3 g/dL — ABNORMAL LOW (ref 11.1–15.9)
Immature Grans (Abs): 0 10*3/uL (ref 0.0–0.1)
Immature Granulocytes: 0 %
Lymphocytes Absolute: 1 10*3/uL (ref 0.7–3.1)
Lymphs: 14 %
MCH: 23.3 pg — ABNORMAL LOW (ref 26.6–33.0)
MCHC: 29.1 g/dL — ABNORMAL LOW (ref 31.5–35.7)
MCV: 80 fL (ref 79–97)
Monocytes Absolute: 0.7 10*3/uL (ref 0.1–0.9)
Monocytes: 10 %
Neutrophils Absolute: 5.6 10*3/uL (ref 1.4–7.0)
Neutrophils: 74 %
Platelets: 353 10*3/uL (ref 150–450)
RBC: 4.42 x10E6/uL (ref 3.77–5.28)
RDW: 14.5 % (ref 11.7–15.4)
WBC: 7.5 10*3/uL (ref 3.4–10.8)

## 2023-08-12 LAB — IRON,TIBC AND FERRITIN PANEL
Ferritin: 26 ng/mL (ref 15–150)
Iron Saturation: 5 % — CL (ref 15–55)
Iron: 20 ug/dL — ABNORMAL LOW (ref 27–139)
Total Iron Binding Capacity: 441 ug/dL (ref 250–450)
UIBC: 421 ug/dL — ABNORMAL HIGH (ref 118–369)

## 2023-08-13 ENCOUNTER — Ambulatory Visit: Payer: Medicare Other | Admitting: Emergency Medicine

## 2023-08-18 ENCOUNTER — Telehealth: Payer: Self-pay | Admitting: *Deleted

## 2023-08-18 DIAGNOSIS — M545 Low back pain, unspecified: Secondary | ICD-10-CM | POA: Diagnosis not present

## 2023-08-18 DIAGNOSIS — D509 Iron deficiency anemia, unspecified: Secondary | ICD-10-CM

## 2023-08-18 DIAGNOSIS — G8929 Other chronic pain: Secondary | ICD-10-CM | POA: Diagnosis not present

## 2023-08-18 DIAGNOSIS — Z6823 Body mass index (BMI) 23.0-23.9, adult: Secondary | ICD-10-CM | POA: Diagnosis not present

## 2023-08-18 NOTE — Addendum Note (Signed)
 Addended by: Armstead Peaks on: 08/18/2023 04:01 PM   Modules accepted: Orders

## 2023-08-18 NOTE — Telephone Encounter (Signed)
 Pt called and states that the iron pills are making her back and side hurt. She states that she is having regular bowel movements. She states she read on the box that you are to take every other day so that is how she is taking them. She wants to know what she can do about the pain? She states if it continues she is going to stop taking them.

## 2023-08-18 NOTE — Telephone Encounter (Signed)
 Referral placed.

## 2023-08-18 NOTE — Telephone Encounter (Signed)
 We can refer her to hematology for IV iron as she doesn't tolerate oral iron.   Mindy/Tammy:  Please place referral to hematology for IDA. Intolerant to oral iron.

## 2023-08-19 NOTE — Telephone Encounter (Signed)
 LMOM for pt to call office

## 2023-08-26 ENCOUNTER — Encounter: Payer: Self-pay | Admitting: Sports Medicine

## 2023-08-26 ENCOUNTER — Ambulatory Visit (INDEPENDENT_AMBULATORY_CARE_PROVIDER_SITE_OTHER): Payer: Medicare Other | Admitting: Sports Medicine

## 2023-08-26 VITALS — BP 106/68 | HR 78 | Temp 97.4°F | Resp 16 | Ht 59.0 in | Wt 114.2 lb

## 2023-08-26 DIAGNOSIS — R1032 Left lower quadrant pain: Secondary | ICD-10-CM | POA: Diagnosis not present

## 2023-08-26 DIAGNOSIS — R040 Epistaxis: Secondary | ICD-10-CM | POA: Diagnosis not present

## 2023-08-26 DIAGNOSIS — I5022 Chronic systolic (congestive) heart failure: Secondary | ICD-10-CM

## 2023-08-26 DIAGNOSIS — G8929 Other chronic pain: Secondary | ICD-10-CM

## 2023-08-26 DIAGNOSIS — D509 Iron deficiency anemia, unspecified: Secondary | ICD-10-CM

## 2023-08-26 DIAGNOSIS — H918X1 Other specified hearing loss, right ear: Secondary | ICD-10-CM | POA: Diagnosis not present

## 2023-08-26 MED ORDER — OXYMETAZOLINE HCL 0.05 % NA SOLN: 1.0000 | Freq: Two times a day (BID) | NASAL | 0 refills | Status: DC

## 2023-08-26 NOTE — Telephone Encounter (Signed)
Spoke to pt, informed her of recommendations. Pt voiced understanding.  

## 2023-08-26 NOTE — Progress Notes (Signed)
 Careteam: Patient Care Team: [name], MD as PCP - General (Internal Medicine) Croitoru, Rachelle Hora, MD as PCP - Cardiology (Cardiology) Leslye Peer, MD as Consulting Physician (Pulmonary Disease) Blima Ledger, OD (Optometry) Mitchel Honour, DO as Consulting Physician (Obstetrics and Gynecology) Laren Boom, DO as Consulting Physician (Otolaryngology)  PLACE OF SERVICE:  [facility]  Advanced Directive information Does Patient Have a Medical Advance Directive?: Yes, Type of Advance Directive: Healthcare Power of Attorney, Does patient want to make changes to medical advance directive?: No - Patient declined  Allergies  Allergen Reactions   Codeine Nausea And Vomiting and Nausea Only   Demerol Hcl [Meperidine] Nausea Only   Ketorolac Nausea And Vomiting, Nausea Only and Other (See Comments)    "pt told to never take this med"   Demerol Nausea And Vomiting   Multaq [Dronedarone] Other (See Comments)    Increased fluid retention   Flexeril [Cyclobenzaprine] Nausea And Vomiting   Meperidine Hcl    Xarelto [Rivaroxaban] Diarrhea   Latex Rash    Pt stated on 09/20/2022    Chief Complaint  Patient presents with   Medical Management of Chronic Issues    3 month follow up. Discuss the need for DTAP vaccine, Covid Booster, Shingrix vaccine, and AWV.     Discussed the use of AI scribe software for clinical note transcription with the patient, who gave verbal consent to proceed.  History of Present Illness   Hailey [name] is a 78 year old female who presents for follow up. She is accompanied by her daughter.  She has been experiencing recurrent epistaxis for several years, with a notable increase in frequency over the past month. Episodes occur at least twice a week and last between 10 to 30 minutes. She is on eliquis for A fib.    She has a history of anemia, with recent blood work showing a hemoglobin level of 10.3, down from 10.6 three months ago. She takes  iron supplements every other day due to pain when taking them daily. She saw GI and underwent EGD and colonoscopy.    EGD 06/30/23: Gastritis biopsied, multiple gastric polyps biopsied, mucosal variant in duodenum biopsied.  Pathology showed fundic gland polyps, reactive gastropathy, neg ative for H. pylori, normal duodenum.   Colonoscopy 06/30/23: Nonbleeding internal hemorrhoids, five 4-13 mm polyps in the transverse colon resected and retrieved, medium sized lipoma in the transverse colon and in proximal transverse colon, diverticulosis in the sigmoid colon.  Pathology showed multiple fragments of tubular adenoma.    She experiences shortness of breath with exertion. No chest pain or swelling in her legs is reported, and she sleeps with her head elevated on a wedge pillow. Denies orthopnea, PND, lower extremity swelling. She is able to speak in full sentences and does not appear to be in distress.  Chronic low back and LLQ pain  Followed with pain clinic. States she is currently taking tramadol 1-2 tab daily along with tizanidine. States her pain is improving and able to sleep for six hours at a stretch.  Her current medications include Eliquis 5 mg twice a day, Lasix half a tablet in the morning and night, levothyroxine, magnesium, metoprolol, and spironolactone. She reports no dizziness or lightheadedness despite low blood pressure readings.  She has a history of using a CPAP machine for mild sleep apnea but has not been compliant recently due to other health priorities. Plans to revisit pulmonology in May for reassessment.  Review of Systems:  Review of Systems  Constitutional:  Negative for chills and fever.  HENT:  Positive for nosebleeds. Negative for congestion and sore throat.   Eyes:  Negative for double vision.  Respiratory:  Positive for shortness of breath (exertional). Negative for cough and sputum production.   Cardiovascular:  Negative for chest pain, palpitations and  leg swelling.  Gastrointestinal:  Negative for abdominal pain, heartburn and nausea.  Genitourinary:  Negative for dysuria, frequency and hematuria.  Musculoskeletal:  Negative for falls and myalgias.  Neurological:  Negative for dizziness, sensory change and focal weakness.   Negative unless indicated in HPI.   Past Medical History:  Diagnosis Date   Acute on chronic diastolic CHF (congestive heart failure), NYHA class 1 (HCC) 04/29/2013   Allergic rhinitis    Brady-tachy syndrome (HCC)    s/p STJ dual chamber PPM by Dr [name]   CAD (coronary artery disease)    70% RCA stenosis, treated medically   Control of atrial fibrillation with pacemaker (HCC)    Diabetes mellitus (HCC)    type 2   DJD (degenerative joint disease)    Dyspnea    occasional   Hearing loss    Right ear only - no hearing aids   Hypertension    Hyperthyroidism    Paroxysmal atrial fibrillation (HCC)    Pacemaker St Jude   Pneumonia    years ago- before 2012 per patient   Polio    as a child   PONV (postoperative nausea and vomiting)    Presence of permanent cardiac pacemaker    St Jude pacemaker   Pulmonary disease    Sleep apnea    uses  CPAP nightly   Past Surgical History:  Procedure Laterality Date   arm surgery d/t fx Right 1996   ATRIAL FIBRILLATION ABLATION  07/31/2012; 08-10-13   PVI by Dr [name]   ATRIAL FIBRILLATION ABLATION N/A 07/31/2012   Procedure: ATRIAL FIBRILLATION ABLATION;  Surgeon: Hillis Range, MD;  Location: [facility] CATH LAB;  Service: Cardiovascular;  Laterality: N/A;   ATRIAL FIBRILLATION ABLATION N/A 08/10/2013   Procedure: ATRIAL FIBRILLATION ABLATION;  Surgeon: Gardiner Rhyme, MD;  Location: [facility] CATH LAB;  Service: Cardiovascular;  Laterality: N/A;   BACK SURGERY  07/26/2012   dR. nUDELMAN   BIOPSY  06/30/2023   Procedure: BIOPSY;  Surgeon: Lanelle Bal, DO;  Location: [facility] ENDO SUITE;  Service: Endoscopy;;   CARDIAC CATHETERIZATION  12/03/2010   single vessel mid RCA    CARDIAC ELECTROPHYSIOLOGY MAPPING AND ABLATION  07/31/2012   Dr. Hillis Range   CESAREAN SECTION  09/20/1974   Dr. Greta Doom   CHOLECYSTECTOMY  1994   COLONOSCOPY WITH PROPOFOL N/A 03/21/2015   Procedure: COLONOSCOPY WITH PROPOFOL;  Surgeon: Charna Elizabeth, MD;  Location: [facility] ENDOSCOPY;  Service: Endoscopy;  Laterality: N/A;   COLONOSCOPY WITH PROPOFOL N/A 06/30/2023   Procedure: COLONOSCOPY WITH PROPOFOL;  Surgeon: Lanelle Bal, DO;  Location: [facility] ENDO SUITE;  Service: Endoscopy;  Laterality: N/A;  11:15 AM, ASA 4   DIAGNOSTIC MAMMOGRAM  04/16/2017   Tylene Fantasia. Morris, DO   ESOPHAGOGASTRODUODENOSCOPY (EGD) WITH PROPOFOL N/A 06/30/2023   Procedure: ESOPHAGOGASTRODUODENOSCOPY (EGD) WITH PROPOFOL;  Surgeon: Lanelle Bal, DO;  Location: [facility];  Service: Endoscopy;  Laterality: N/A;  11:15 AM, ASA 4   EYE SURGERY Bilateral    remove cataracts   LAPAROSCOPIC HYSTERECTOMY  Late 70s to early 80s   LEFT HEART CATH AND CORONARY ANGIOGRAPHY N/A 07/30/2017  Procedure: LEFT HEART CATH AND CORONARY ANGIOGRAPHY;  Surgeon: Lennette Bihari, MD;  Location: [facility] INVASIVE CV LAB;  Service: Cardiovascular;  Laterality: N/A;   LUMBAR LAMINECTOMY/DECOMPRESSION MICRODISCECTOMY Right 12/23/2012   Procedure: Right L5-S1 Laminotomy for resection of synovial cyst;  Surgeon: [name], MD;  Location: [facility] NEURO ORS;  Service: Neurosurgery;  Laterality: Right;  Right Lumbar five-sacral one Laminotomy for resection of synovial cyst   NM MYOCAR PERF WALL MOTION  12/20/2010   normal   PACEMAKER IMPLANT N/A 08/09/2019   Procedure: PACEMAKER IMPLANT;  Surgeon: [name], MD;  Location: [facility] INVASIVE CV LAB;  Service: Cardiovascular;  Laterality: N/A;   PACEMAKER INSERTION  12/03/2010   SJM Accent DR RF implanted by Dr [name]   POLYPECTOMY  06/30/2023   Procedure: POLYPECTOMY;  Surgeon: Lanelle Bal, DO;  Location: [facility];  Service: Endoscopy;;   RIGHT HEART CATH N/A 04/22/2018   Procedure:  RIGHT HEART CATH;  Surgeon: Iran Ouch, MD;  Location[facility] INVASIVE CV LAB;  Service: Cardiovascular;  Laterality: N/A;   RIGHT HEART CATH N/A 07/09/2022   Procedure: RIGHT HEART CATH;  Surgeon: Dolores Patty, MD;  Location: [facility] INVASIVE CV LAB;  Service: Cardiovascular;  Laterality: N/A;   TEE WITHOUT CARDIOVERSION N/A 07/30/2012   Procedure: TRANSESOPHAGEAL ECHOCARDIOGRAM (TEE);  Surgeon: [name], MD;  Location: [facility] ENDOSCOPY;  Service: Cardiovascular;  Laterality: N/A;  h&p in file-hope   TEE WITHOUT CARDIOVERSION N/A 08/09/2013   Procedure: TRANSESOPHAGEAL ECHOCARDIOGRAM (TEE);  Surgeon: Lewayne Bunting, MD;  Location: [facility] ENDOSCOPY;  Service: Cardiovascular;  Laterality: N/A;   THYROID LOBECTOMY Right 09/20/2022   Procedure: RIGHT HEMITHYROIDECTOMY;  Surgeon: Laren Boom, DO;  Location: [facility] OR;  Service: ENT;  Laterality: Right;   TOTAL ABDOMINAL HYSTERECTOMY  1981   ULTRASOUND GUIDANCE FOR VASCULAR ACCESS  04/22/2018   Procedure: Ultrasound Guidance For Vascular Access;  Surgeon: Iran Ouch, MD;  Location: [facility] INVASIVE CV LAB;  Service: Cardiovascular;;   Social History:   reports that she has never smoked. She has never used smokeless tobacco. She reports that she does not drink alcohol and does not use drugs.  Family History  Problem Relation Age of Onset   Cancer Brother    Heart attack Sister    Breast cancer Sister    Diabetes Sister    Heart disease Sister    Breast cancer Sister    Dementia Sister    Colon cancer Sister    Heart disease Sister    Breast cancer Sister    Suicidality Brother    Prostate cancer Brother    Dementia Brother     Medications: Patient's Medications  New Prescriptions   OXYMETAZOLINE (AFRIN NASAL SPRAY) 0.05 % NASAL SPRAY    Place 1 spray into both nostrils 2 (two) times daily.  Previous Medications   ACETAMINOPHEN (TYLENOL) 500 MG TABLET    Take 500-1,000 mg by mouth every 6 (six) hours as needed (pain.).    APIXABAN (ELIQUIS) 5 MG TABS TABLET    TAKE ONE TABLET BY MOUTH TWICE DAILY   CHOLECALCIFEROL (VITAMIN D3) 1000 UNITS CAPS    Take 1,000 Units by mouth in the morning.   FLUTICASONE (FLONASE) 50 MCG/ACT NASAL SPRAY    Place 2 sprays into both nostrils daily.   FUROSEMIDE (LASIX) 80 MG TABLET    Take 40 mg by mouth in the morning and at bedtime. 1/2 tablet morning and night   GLUCOSE BLOOD (ONETOUCH ULTRA) TEST STRIP  USE DAILY TO CHECK BLOOD SUGAR E11.8   LANCETS MISC. (ONE TOUCH SURESOFT) MISC    Use to test  Blood sugar once daily DX: E11.8,   LEVOTHYROXINE (SYNTHROID) 25 MCG TABLET    Take 25 mcg by mouth daily before breakfast.   LIDOCAINE (HM LIDOCAINE PATCH) 4 %    Place 1 patch onto the skin daily.   MAGNESIUM OXIDE (MAG-OX) 400 (240 MG) MG TABLET    Take 400 mg by mouth daily.   METOPROLOL TARTRATE (LOPRESSOR) 25 MG TABLET    Take 12.5 mg by mouth in the morning and at bedtime.   ONDANSETRON (ZOFRAN) 4 MG TABLET    Take 1 tablet (4 mg total) by mouth every 8 (eight) hours as needed for nausea or vomiting.   PANTOPRAZOLE (PROTONIX) 40 MG TABLET    Take 1 tablet (40 mg total) by mouth 2 (two) times daily before a meal.   POTASSIUM CHLORIDE SA (KLOR-CON M) 20 MEQ TABLET    TAKE ONE TABLET BY MOUTH TWICE DAILY   SILDENAFIL (REVATIO) 20 MG TABLET    TAKE ONE TABLET BY MOUTH 3 TIMES DAILY   SOD PICOSULFATE-MAG OX-CIT ACD (CLENPIQ) 10-3.5-12 MG-GM -GM/175ML SOLN    Take 1 kit by mouth as directed.   SPIRONOLACTONE (ALDACTONE) 25 MG TABLET    Take 1 tablet (25 mg total) by mouth daily.   TIZANIDINE (ZANAFLEX) 2 MG CAPSULE    Take 2 mg by mouth at bedtime.   TRAMADOL (ULTRAM) 50 MG TABLET    Take 50-100 mg by mouth every 8 (eight) hours as needed.  Modified Medications   No medications on file  Discontinued Medications   No medications on file    Physical Exam: Vitals:   08/26/23 1252  BP: 106/68  Pulse: 78  Resp: 16  Temp: (!) 97.4 F (36.3 C)  SpO2: 98%  Weight: 114 lb 3.2 oz  (51.8 kg)  Height: 4\' 11"  (1.499 m)   Body mass index is 23.07 kg/m. BP Readings from Last 3 Encounters:  08/26/23 106/68  08/11/23 130/79  08/05/23 (!) 143/75   Wt Readings from Last 3 Encounters:  08/26/23 114 lb 3.2 oz (51.8 kg)  08/11/23 116 lb 12.8 oz (53 kg)  06/30/23 118 lb 3.2 oz (53.6 kg)    Physical Exam Constitutional:      Appearance: Normal appearance.  HENT:     Head: Normocephalic and atraumatic.  Cardiovascular:     Rate and Rhythm: Normal rate and regular rhythm.  Pulmonary:     Effort: Pulmonary effort is normal. No respiratory distress.     Breath sounds: Normal breath sounds. No wheezing.  Abdominal:     General: Bowel sounds are normal. There is no distension.     Tenderness: There is no abdominal tenderness. There is no guarding or rebound.     Comments:    Musculoskeletal:        General: No swelling.  Neurological:     Mental Status: She is alert. Mental status is at baseline.     Sensory: No sensory deficit.     Motor: No weakness.     Labs reviewed: Basic Metabolic Panel: Recent Labs    11/01/22 1233 11/19/22 1418 01/13/23 1300 04/15/23 1544 05/12/23 1626 05/13/23 1418 05/25/23 1030  NA 134 135  --  135 138 136 132*  K 4.7 4.3  --  4.0 5.0 4.6 4.2  CL 94* 95*  --  96* 98 100 97*  CO2 20  22  --  23 22 27 25   GLUCOSE 113* 94  --  112* 82 96 123*  BUN 18 20  --  15 12 13 17   CREATININE 1.34* 1.41*   < > 1.08* 1.18* 1.09* 1.13*  CALCIUM 9.1 9.0  --  9.5 9.2 9.4 9.1  MG 2.4* 2.5*  --  2.2  --   --   --   TSH  --   --   --   --  4.650*  --   --    < > = values in this interval not displayed.   Liver Function Tests: Recent Labs    05/25/23 1030  AST 13*  ALT 9  ALKPHOS 92  BILITOT 1.0  PROT 6.9  ALBUMIN 3.9   Recent Labs    05/25/23 1030  LIPASE 34   No results for input(s): "AMMONIA" in the last 8760 hours. CBC: Recent Labs    04/15/23 1544 05/13/23 1418 05/25/23 1030 08/11/23 1514  WBC 7.0 7.0 5.6 7.5   NEUTROABS 4,949  --   --  5.6  HGB 11.2* 10.4* 10.6* 10.3*  HCT 38.6 35.4 37.9 35.4  MCV 78.9* 78.3* 81.3 80  PLT 350 317 324 353   Lipid Panel: No results for input(s): "CHOL", "HDL", "LDLCALC", "TRIG", "CHOLHDL", "LDLDIRECT" in the last 8760 hours. TSH: Recent Labs    05/12/23 1626  TSH 4.650*   A1C: Lab Results  Component Value Date   HGBA1C 7.3 (H) 05/13/2023    Assessment and Plan    Epistaxis Chronic epistaxis likely exacerbated by anticoagulants and nasal dryness. Previous ENT evaluation normal. Discussed saline mist and Afrin nasal spray use. Emphasized direct pressure and potential hospital evaluation if severe bleeding occurs. - Use saline mist daily. - Use Afrin nasal spray during bleeding. - Apply direct pressure during epistaxis. - Schedule ENT appointment.  Chronic Pain Chronic pain improved with tramadol and tizanidine. Pain reduced from 8/10 with current regimen. - Continue tramadol 50 mg once daily. - Continue tizanidine at night.  Anemia Anemia likely due to iron deficiency. Hemoglobin decreased slightly.   As per chart review Gi is arranging for iron infusions Monitor for signs of bleeding  Heart Failure Heart failure managed with furosemide, metoprolol, and spironolactone.   Euvolemic on exam Advised to monitor for dizziness - Monitor for dizziness.  Sleep Apnea Mild sleep apnea previously managed with CPAP, now discontinued. No snoring or apneas reported. Follow-up with pulmonology planned for reassessment. - Follow up with pulmonology in May.    Hearing Loss Reported difficulty hearing, particularly on the right side. Referral to audiology requested. - Placed  referral to audiology.         42 min Total time spent for obtaining history,  performing a medically appropriate examination and evaluation, reviewing the tests,  documenting clinical information in the electronic or other health record,  care coordination (not separately  reported)

## 2023-09-01 ENCOUNTER — Other Ambulatory Visit: Payer: Self-pay | Admitting: Adult Health

## 2023-09-01 DIAGNOSIS — E119 Type 2 diabetes mellitus without complications: Secondary | ICD-10-CM | POA: Diagnosis not present

## 2023-09-01 DIAGNOSIS — I2721 Secondary pulmonary arterial hypertension: Secondary | ICD-10-CM | POA: Diagnosis not present

## 2023-09-01 DIAGNOSIS — I5032 Chronic diastolic (congestive) heart failure: Secondary | ICD-10-CM | POA: Diagnosis not present

## 2023-09-01 DIAGNOSIS — M545 Low back pain, unspecified: Secondary | ICD-10-CM | POA: Diagnosis not present

## 2023-09-01 DIAGNOSIS — Z6823 Body mass index (BMI) 23.0-23.9, adult: Secondary | ICD-10-CM | POA: Diagnosis not present

## 2023-09-01 DIAGNOSIS — M5136 Other intervertebral disc degeneration, lumbar region with discogenic back pain only: Secondary | ICD-10-CM | POA: Diagnosis not present

## 2023-09-01 DIAGNOSIS — G8929 Other chronic pain: Secondary | ICD-10-CM | POA: Diagnosis not present

## 2023-09-01 DIAGNOSIS — I482 Chronic atrial fibrillation, unspecified: Secondary | ICD-10-CM | POA: Diagnosis not present

## 2023-09-01 DIAGNOSIS — I495 Sick sinus syndrome: Secondary | ICD-10-CM | POA: Diagnosis not present

## 2023-09-23 NOTE — Addendum Note (Signed)
 Addended by: Lott Rouleau A on: 09/23/2023 11:49 AM   Modules accepted: Orders

## 2023-09-23 NOTE — Progress Notes (Signed)
 Remote pacemaker transmission.

## 2023-10-10 ENCOUNTER — Ambulatory Visit: Admitting: Emergency Medicine

## 2023-10-16 ENCOUNTER — Ambulatory Visit: Attending: Sports Medicine | Admitting: Audiologist

## 2023-10-16 DIAGNOSIS — H6121 Impacted cerumen, right ear: Secondary | ICD-10-CM | POA: Insufficient documentation

## 2023-10-16 DIAGNOSIS — H90A22 Sensorineural hearing loss, unilateral, left ear, with restricted hearing on the contralateral side: Secondary | ICD-10-CM | POA: Insufficient documentation

## 2023-10-16 DIAGNOSIS — H90A31 Mixed conductive and sensorineural hearing loss, unilateral, right ear with restricted hearing on the contralateral side: Secondary | ICD-10-CM | POA: Diagnosis not present

## 2023-10-16 NOTE — Procedures (Signed)
  [facility] [address] [place], [place]  [address] [phone]  AUDIOLOGICAL  EVALUATION  NAME: Hailey [name]     DOB:   09-17-1945      MRN: [id number]                                                                                     DATE: 10/16/2023     REFERENT: Tye Gall, MD STATUS: Outpatient DIAGNOSIS: Mixed Hearing Loss Right Ear, Sensorineural Hearing Loss Left Ear   History: Hailey [name] was seen for an audiological evaluation due to difficulty hearing. Daughter is aware that Hailey [name] has hearing loss. Hailey [name] is lipreading. She has turned her TV loud.  Hailey [name] denies pain, pressure, or tinnitus. Hailey [name] has a daughter with SSCD. Her mother had hearing loss. Hailey [name] has never had a hearing test before.  Hailey [name] has no significant history of hazardous noise exposure.  Medical history shows no additional risk for hearing loss.    Evaluation:  Otoscopy showed a no view of the tympanic membrane in the right ear due to flaking dried cerumen, and clear view of healthy TM in left ear Tympanometry results were consistent with normal middle ear function bilaterally, more compliance in left ear indicating possible cerumen on right TM Audiometric testing was completed using Conventional Audiometry techniques with insert earphones and supraural headphones. Test results are consistent with sloping sensorineural hearing loss mild to severe bilaterally, conductive component in right ear 250-1kHz. Speech Recognition Thresholds were obtained at 65dB HL in the right ear and at  50dB HL in the left ear. Word Recognition Testing was completed at  40dB SL and Hailey [name] scored 68% in right ear at 5dB below UCL (85dB) and 84% in the left ear at 90dB.   Results:  The test results were reviewed with Hailey [name] and her daughter Hailey [name]. Hailey [name] has severe hearing loss in both ears, mixed loss in the right ear and sensorineural in the left ear. The conductive component in the right ear is  likely due to excessive cerumen. She needs hearing aids bilaterally.  Audiogram printed and provided to Hailey [name]. She has [employer] benefit, also gave Hailey [name]'s daughter a list of local hearing aid providers.    Recommendations: Hearing aids recommended for both ears. Patient given list of local hearing aid providers.  Debrox Earwax Removal Drops are a safe and inexpensive in-home solution for wax removal. See provided handout.  Have hearing retested to check for improvement in right ear after wax is removed.    38 minutes spent testing and counseling on results.   If you have any questions please feel free to contact me at [phone].  Raynald Calkins Stalnaker Au.D.  Audiologist   10/16/2023  1:42 PM  Cc: Tye Gall, MD

## 2023-10-20 DIAGNOSIS — M545 Low back pain, unspecified: Secondary | ICD-10-CM | POA: Diagnosis not present

## 2023-10-20 DIAGNOSIS — M5136 Other intervertebral disc degeneration, lumbar region with discogenic back pain only: Secondary | ICD-10-CM | POA: Diagnosis not present

## 2023-10-20 DIAGNOSIS — R5383 Other fatigue: Secondary | ICD-10-CM | POA: Diagnosis not present

## 2023-10-20 DIAGNOSIS — E119 Type 2 diabetes mellitus without complications: Secondary | ICD-10-CM | POA: Diagnosis not present

## 2023-10-20 DIAGNOSIS — M542 Cervicalgia: Secondary | ICD-10-CM | POA: Diagnosis not present

## 2023-10-20 DIAGNOSIS — M25551 Pain in right hip: Secondary | ICD-10-CM | POA: Diagnosis not present

## 2023-10-20 DIAGNOSIS — D539 Nutritional anemia, unspecified: Secondary | ICD-10-CM | POA: Diagnosis not present

## 2023-10-20 DIAGNOSIS — I495 Sick sinus syndrome: Secondary | ICD-10-CM | POA: Diagnosis not present

## 2023-10-20 DIAGNOSIS — M25552 Pain in left hip: Secondary | ICD-10-CM | POA: Diagnosis not present

## 2023-10-20 DIAGNOSIS — G8929 Other chronic pain: Secondary | ICD-10-CM | POA: Diagnosis not present

## 2023-10-20 DIAGNOSIS — M129 Arthropathy, unspecified: Secondary | ICD-10-CM | POA: Diagnosis not present

## 2023-10-20 DIAGNOSIS — Z6823 Body mass index (BMI) 23.0-23.9, adult: Secondary | ICD-10-CM | POA: Diagnosis not present

## 2023-10-20 DIAGNOSIS — I482 Chronic atrial fibrillation, unspecified: Secondary | ICD-10-CM | POA: Diagnosis not present

## 2023-10-21 ENCOUNTER — Encounter: Payer: Self-pay | Admitting: Gastroenterology

## 2023-11-02 DIAGNOSIS — Z7901 Long term (current) use of anticoagulants: Secondary | ICD-10-CM | POA: Diagnosis not present

## 2023-11-02 DIAGNOSIS — I2510 Atherosclerotic heart disease of native coronary artery without angina pectoris: Secondary | ICD-10-CM | POA: Diagnosis not present

## 2023-11-02 DIAGNOSIS — R0602 Shortness of breath: Secondary | ICD-10-CM | POA: Diagnosis not present

## 2023-11-02 DIAGNOSIS — N183 Chronic kidney disease, stage 3 unspecified: Secondary | ICD-10-CM | POA: Insufficient documentation

## 2023-11-02 DIAGNOSIS — J90 Pleural effusion, not elsewhere classified: Secondary | ICD-10-CM | POA: Diagnosis not present

## 2023-11-02 DIAGNOSIS — I130 Hypertensive heart and chronic kidney disease with heart failure and stage 1 through stage 4 chronic kidney disease, or unspecified chronic kidney disease: Secondary | ICD-10-CM | POA: Diagnosis not present

## 2023-11-02 DIAGNOSIS — E890 Postprocedural hypothyroidism: Secondary | ICD-10-CM | POA: Diagnosis not present

## 2023-11-02 DIAGNOSIS — R079 Chest pain, unspecified: Secondary | ICD-10-CM | POA: Diagnosis not present

## 2023-11-02 DIAGNOSIS — I082 Rheumatic disorders of both aortic and tricuspid valves: Secondary | ICD-10-CM | POA: Diagnosis not present

## 2023-11-02 DIAGNOSIS — R4702 Dysphasia: Secondary | ICD-10-CM | POA: Diagnosis not present

## 2023-11-02 DIAGNOSIS — I480 Paroxysmal atrial fibrillation: Secondary | ICD-10-CM | POA: Diagnosis not present

## 2023-11-02 DIAGNOSIS — R918 Other nonspecific abnormal finding of lung field: Secondary | ICD-10-CM | POA: Diagnosis not present

## 2023-11-02 DIAGNOSIS — R1013 Epigastric pain: Secondary | ICD-10-CM | POA: Diagnosis not present

## 2023-11-02 DIAGNOSIS — I50812 Chronic right heart failure: Secondary | ICD-10-CM | POA: Diagnosis not present

## 2023-11-02 DIAGNOSIS — I482 Chronic atrial fibrillation, unspecified: Secondary | ICD-10-CM | POA: Diagnosis not present

## 2023-11-02 DIAGNOSIS — I5033 Acute on chronic diastolic (congestive) heart failure: Secondary | ICD-10-CM | POA: Diagnosis not present

## 2023-11-02 DIAGNOSIS — R911 Solitary pulmonary nodule: Secondary | ICD-10-CM | POA: Diagnosis not present

## 2023-11-02 DIAGNOSIS — Z79899 Other long term (current) drug therapy: Secondary | ICD-10-CM | POA: Diagnosis not present

## 2023-11-02 DIAGNOSIS — K5730 Diverticulosis of large intestine without perforation or abscess without bleeding: Secondary | ICD-10-CM | POA: Diagnosis not present

## 2023-11-02 DIAGNOSIS — Z66 Do not resuscitate: Secondary | ICD-10-CM | POA: Diagnosis not present

## 2023-11-02 DIAGNOSIS — Z885 Allergy status to narcotic agent status: Secondary | ICD-10-CM | POA: Diagnosis not present

## 2023-11-02 DIAGNOSIS — I509 Heart failure, unspecified: Secondary | ICD-10-CM | POA: Diagnosis not present

## 2023-11-02 DIAGNOSIS — Z79890 Hormone replacement therapy (postmenopausal): Secondary | ICD-10-CM | POA: Diagnosis not present

## 2023-11-02 DIAGNOSIS — R0689 Other abnormalities of breathing: Secondary | ICD-10-CM | POA: Diagnosis not present

## 2023-11-02 DIAGNOSIS — I110 Hypertensive heart disease with heart failure: Secondary | ICD-10-CM | POA: Diagnosis not present

## 2023-11-02 DIAGNOSIS — R059 Cough, unspecified: Secondary | ICD-10-CM | POA: Diagnosis not present

## 2023-11-02 DIAGNOSIS — E042 Nontoxic multinodular goiter: Secondary | ICD-10-CM | POA: Diagnosis not present

## 2023-11-02 DIAGNOSIS — I272 Pulmonary hypertension, unspecified: Secondary | ICD-10-CM | POA: Diagnosis not present

## 2023-11-02 DIAGNOSIS — Z950 Presence of cardiac pacemaker: Secondary | ICD-10-CM | POA: Diagnosis not present

## 2023-11-02 DIAGNOSIS — I5023 Acute on chronic systolic (congestive) heart failure: Secondary | ICD-10-CM | POA: Diagnosis not present

## 2023-11-02 DIAGNOSIS — I10 Essential (primary) hypertension: Secondary | ICD-10-CM | POA: Diagnosis not present

## 2023-11-03 DIAGNOSIS — I082 Rheumatic disorders of both aortic and tricuspid valves: Secondary | ICD-10-CM | POA: Diagnosis not present

## 2023-11-03 DIAGNOSIS — E042 Nontoxic multinodular goiter: Secondary | ICD-10-CM | POA: Diagnosis not present

## 2023-11-03 DIAGNOSIS — I50812 Chronic right heart failure: Secondary | ICD-10-CM | POA: Diagnosis not present

## 2023-11-03 DIAGNOSIS — N183 Chronic kidney disease, stage 3 unspecified: Secondary | ICD-10-CM | POA: Diagnosis not present

## 2023-11-03 DIAGNOSIS — I272 Pulmonary hypertension, unspecified: Secondary | ICD-10-CM | POA: Diagnosis not present

## 2023-11-03 DIAGNOSIS — Z79899 Other long term (current) drug therapy: Secondary | ICD-10-CM | POA: Diagnosis not present

## 2023-11-03 DIAGNOSIS — I2510 Atherosclerotic heart disease of native coronary artery without angina pectoris: Secondary | ICD-10-CM | POA: Diagnosis not present

## 2023-11-03 DIAGNOSIS — I5033 Acute on chronic diastolic (congestive) heart failure: Secondary | ICD-10-CM | POA: Diagnosis not present

## 2023-11-03 DIAGNOSIS — R911 Solitary pulmonary nodule: Secondary | ICD-10-CM | POA: Diagnosis not present

## 2023-11-03 DIAGNOSIS — I110 Hypertensive heart disease with heart failure: Secondary | ICD-10-CM | POA: Diagnosis not present

## 2023-11-03 DIAGNOSIS — I480 Paroxysmal atrial fibrillation: Secondary | ICD-10-CM | POA: Diagnosis not present

## 2023-11-04 ENCOUNTER — Telehealth: Payer: Self-pay

## 2023-11-04 NOTE — Transitions of Care (Post Inpatient/ED Visit) (Signed)
   11/04/2023  Name: Hailey [name] MRN: [id number] DOB: Jan 31, 1946  Today's TOC FU Call Status: Today's TOC FU Call Status:: Successful TOC FU Call Completed TOC FU Call Complete Date: 11/04/23 Patient's Name and Date of Birth confirmed.Patient and her son, Hailey [name] state patient has many people supporting her and has [employer] with her at time of call and declined at this time. TOC RN discussed importance of scheduling hospital follow up - son verbalized understanding  Transition Care Management Follow-up Telephone Call Date of Discharge: 11/03/23 Discharge Facility: Other [facility]) Name of Other (Non[facility]) Discharge Facility: [facility] Type of Discharge: Inpatient Admission Primary Inpatient Discharge Diagnosis:: Acute on systolic CHF  Tonia Frankel RN, CCM [facility]  VBCI-Population Health RN Care Manager [phone]

## 2023-11-05 ENCOUNTER — Ambulatory Visit (INDEPENDENT_AMBULATORY_CARE_PROVIDER_SITE_OTHER): Payer: Medicare Other

## 2023-11-05 DIAGNOSIS — I495 Sick sinus syndrome: Secondary | ICD-10-CM | POA: Diagnosis not present

## 2023-11-05 LAB — CUP PACEART REMOTE DEVICE CHECK
Battery Remaining Longevity: 94 mo
Battery Remaining Percentage: 67 %
Battery Voltage: 3.01 V
Brady Statistic RV Percent Paced: 93 %
Date Time Interrogation Session: 20250604040015
Implantable Lead Connection Status: 753985
Implantable Lead Connection Status: 753985
Implantable Lead Connection Status: 753985
Implantable Lead Implant Date: 20120702
Implantable Lead Implant Date: 20120702
Implantable Lead Implant Date: 20210308
Implantable Lead Location: 753859
Implantable Lead Location: 753860
Implantable Lead Location: 753860
Implantable Pulse Generator Implant Date: 20210308
Lead Channel Impedance Value: 610 Ohm
Lead Channel Pacing Threshold Amplitude: 0.625 V
Lead Channel Pacing Threshold Pulse Width: 0.5 ms
Lead Channel Sensing Intrinsic Amplitude: 12 mV
Lead Channel Setting Pacing Amplitude: 0.875
Lead Channel Setting Pacing Pulse Width: 0.5 ms
Lead Channel Setting Sensing Sensitivity: 2 mV
Pulse Gen Model: 2272
Pulse Gen Serial Number: 3801383

## 2023-11-06 ENCOUNTER — Telehealth: Payer: Self-pay | Admitting: Cardiovascular Disease

## 2023-11-06 NOTE — Telephone Encounter (Signed)
 Patient identification verified by 2 forms. Sims Duck, RN     Call routed by call center. Patient's son LEE called back regarding Edema for             Interventions/Plan: Chart review shows LEE (son) not listed as point of contact within chart or on DPR. No contact phone number left in phone note for LEE either.   This RN Called patient again to follow up on EDEMA. No answer. LVMTCB.   This RN, Called daughter Orville Blank and explained that I am unable to discuss patient information with LEE as he is not listed in chart. Rice Chamorro asked "have you tried to call her?"  This RN explained that I have contacted the patient several times without success in regards to abnormal symptoms she reported. Recommended Rice Chamorro try and contact the patient and call us  back to update us .    Tried calling patient home number. No answer. LVMTCB    Rice Chamorro agrees with plan. No further questions at this time.

## 2023-11-06 NOTE — Telephone Encounter (Signed)
 Pt 's son Merlyn Starring calling back requesting cb

## 2023-11-06 NOTE — Telephone Encounter (Signed)
 Pt c/o swelling/edema: STAT if pt has developed SOB within 24 hours  If swelling, where is the swelling located? Feet/legs  How much weight have you gained and in what time span? 2 1/2 pds since yesterday  Have you gained 2 pounds in a day or 5 pounds in a week? yes  Do you have a log of your daily weights (if so, list)? yes  Are you currently taking a fluid pill? Yes. Pt has questions about dosage of Furosemide   Are you currently SOB? Some sob, tightness in chest  Have you traveled recently in a car or plane for an extended period of time? No

## 2023-11-07 ENCOUNTER — Ambulatory Visit
Admission: RE | Admit: 2023-11-07 | Discharge: 2023-11-07 | Disposition: A | Discharge status: Home / Self Care | Source: Ambulatory Visit | Attending: Family | Admitting: Family

## 2023-11-07 ENCOUNTER — Ambulatory Visit: Payer: Self-pay | Admitting: Cardiovascular Disease

## 2023-11-07 ENCOUNTER — Ambulatory Visit: Admitting: Family

## 2023-11-07 ENCOUNTER — Encounter: Payer: Self-pay | Admitting: Family

## 2023-11-07 ENCOUNTER — Ambulatory Visit: Payer: Self-pay

## 2023-11-07 VITALS — BP 132/68 | HR 58 | Temp 97.1°F | Ht 59.0 in | Wt 102.0 lb

## 2023-11-07 DIAGNOSIS — D509 Iron deficiency anemia, unspecified: Secondary | ICD-10-CM | POA: Diagnosis not present

## 2023-11-07 DIAGNOSIS — E042 Nontoxic multinodular goiter: Secondary | ICD-10-CM

## 2023-11-07 DIAGNOSIS — I5022 Chronic systolic (congestive) heart failure: Secondary | ICD-10-CM | POA: Diagnosis not present

## 2023-11-07 DIAGNOSIS — R053 Chronic cough: Secondary | ICD-10-CM

## 2023-11-07 DIAGNOSIS — R059 Cough, unspecified: Secondary | ICD-10-CM | POA: Diagnosis not present

## 2023-11-07 NOTE — Telephone Encounter (Signed)
 Noted

## 2023-11-07 NOTE — Telephone Encounter (Signed)
 Pts son advised and she has appt 11/13/23.

## 2023-11-07 NOTE — Telephone Encounter (Signed)
 Pt is returning call to nurse. Please call [phone]

## 2023-11-07 NOTE — Telephone Encounter (Signed)
 FYI Only or Action Required?: FYI only for provider  Patient was last seen in primary care on 08/26/2023 by Tye Gall, MD. Called Nurse Triage reporting Shortness of Breath. Symptoms began yesterday. Interventions attempted: calling cardiology and they advised to possibly see PCP. Symptoms are: gradually worsening.  Triage Disposition: See HCP Within 4 Hours (Or PCP Triage)  Patient/caregiver understands and will follow disposition?: Yes              Patient requested afternoon appointment due to having an hour drive to the office. Appointment made for today 11/07/2023 at 2:20 PM with Dinah Ngetich.         Copied from CRM [id number]. Topic: Clinical - Red Word Triage >> Nov 07, 2023  9:34 AM Tisa Forester wrote: Red Word that prompted transfer to Nurse Triage:  patient was discharge from hospital Monday 11/03/23 , patient is experiencig  pressure in lungs when breath and tightness in chest ,have shortness breath when do anything walking ,etc.  Right  under breast bone hurt when breath patient is coughing a lot  Patient callback [phone] Reason for Disposition  [1] MILD difficulty breathing (e.g., minimal/no SOB at rest, SOB with walking, pulse <100) AND [2] NEW-onset or WORSE than normal  Answer Assessment - Initial Assessment Questions 1. RESPIRATORY STATUS: "Describe your breathing?" (e.g., wheezing, shortness of breath, unable to speak, severe coughing)      Patient states she can't take a full deep breath 2. ONSET: "When did this breathing problem begin?"      Yesterday 3. PATTERN "Does the difficult breathing come and go, or has it been constant since it started?"      "Stationary" 4. SEVERITY: "How bad is your breathing?" (e.g., mild, moderate, severe)    - MILD: No SOB at rest, mild SOB with walking, speaks normally in sentences, can lie down, no retractions, pulse < 100.    - MODERATE: SOB at rest, SOB with minimal exertion and prefers to sit, cannot lie  down flat, speaks in phrases, mild retractions, audible wheezing, pulse 100-120.    - SEVERE: Very SOB at rest, speaks in single words, struggling to breathe, sitting hunched forward, retractions, pulse > 120      mild 5. RECURRENT SYMPTOM: "Have you had difficulty breathing before?" If Yes, ask: "When was the last time?" and "What happened that time?"      ------- 6. CARDIAC HISTORY: "Do you have any history of heart disease?" (e.g., heart attack, angina, bypass surgery, angioplasty)      Congestive Heart Failure 7. LUNG HISTORY: "Do you have any history of lung disease?"  (e.g., pulmonary embolus, asthma, emphysema)     Patient denies 8. CAUSE: "What do you think is causing the breathing problem?"      ------ 9. OTHER SYMPTOMS: "Do you have any other symptoms? (e.g., dizziness, runny nose, cough, chest pain, fever)     Sinus congestion, mostly dry cough but sometimes clear mucous comes up, chest tightness 10. O2 SATURATION MONITOR:  "Do you use an oxygen saturation monitor (pulse oximeter) at home?" If Yes, ask: "What is your reading (oxygen level) today?" "What is your usual oxygen saturation reading?" (e.g., 95%)   N/A      Patient discharged from hospital Monday Yesterday patient started feeling bad again. Patient spoke with her Cardiologist this morning and they were going to check to see if they could get her in for an appointment and they also suggested that the patient see her PCP to possibly  get a chest Xray to rule out pneumonia.  Protocols used: Breathing Difficulty-A-AH

## 2023-11-07 NOTE — Patient Instructions (Addendum)
 Please follow up with Endocrinology:  Edra Govern, MD (Attending) NPI: [id number] [phone] (Work) [phone] [facility] [address] [place], [place] [address]

## 2023-11-07 NOTE — Telephone Encounter (Signed)
 Please have her continue taking furosemide  80 mg once daily and schedule her to have a BMET and BNP checked and follow-up visit with APP next week

## 2023-11-07 NOTE — Telephone Encounter (Signed)
 Pt called and gave permission to talk with her son Merlyn Starring in the future... she says she had bilateral leg edema yesterday and she took the entire lasix  80 mg all at once instead of breaking it up into 2 doses... she urinated a lot yesterday afternoon.   Her weight is actually decreasing:   6/4  100.8 6/5  100.2 6/6  99.8 lbs  She says she has tightness in her chest.. she is coughing with clear sputum production. No fever... she says she is SOB ar rest and feels she has chest congestion.   I advised her hat I will let Dr Alvis Ba know for his review but I also think that she should reach out to her PCP to possibly get in to be seen today.

## 2023-11-09 NOTE — Progress Notes (Signed)
 Provider: Waseem Suess FNP-C  Tye Gall, MD  Patient Care Team: Tye Gall, MD as PCP - General (Internal Medicine) Croitoru, Karyl Paget, MD as PCP - Cardiology (Cardiology) Denson Flake, MD as Consulting Physician (Pulmonary Disease) Princella Brooklyn, OD (Optometry) Dyanna Glasgow, DO as Consulting Physician (Obstetrics and Gynecology) Daleen Dubs, DO as Consulting Physician (Otolaryngology)  Extended Emergency Contact Information Primary Emergency Contact: Slusher,Laura Address: [address]          [place], [place] [address] [place] Mobile Phone: [phone] Relation: Daughter Secondary Emergency Contact: Haltom,Lisa Address: [address]          [place], [place] [address] [place] Phone: [phone] Relation: Daughter  Code Status:  Full Code  Goals of care: Advanced Directive information    08/26/2023    1:06 PM  Advanced Directives  Does Patient Have a Medical Advance Directive? Yes  Type of Advance Directive Healthcare Power of Attorney  Does patient want to make changes to medical advance directive? No - Patient declined  Copy of Healthcare Power of Attorney in Chart? Yes - validated most recent copy scanned in chart (See row information)     Chief Complaint  Patient presents with   Shortness of Breath    Having sob when moving around, when she taking a deep breath she feels like it pressure across herupper stomach , having tightness in chest and clear  mucus     Discussed the use of AI scribe software for clinical note transcription with the patient, who gave verbal consent to proceed.  History of Present Illness   Hailey [name] is a 78 year old female with congestive heart failure who presents for a hospital follow-up after fluid overload treatment. Here with son and daughter.  She was recently hospitalized due to congestive heart failure and fluid overload, during which her blood  pressure increased significantly. She experienced a heavy feeling and tightness across her chest. Post-hospitalization, she has been resting and avoiding chores but continues to feel tired. She has a persistent cough, especially when eating or performing chores, and requires frequent hydration. She experiences shortness of breath with prolonged walking and mild swelling in her legs. She does not use compression stockings. During her hospital stay, she underwent diuresis to remove fluid around her heart and lungs and was prescribed furosemide  40 mg twice daily.  She has a history of partial thyroidectomy, with nodules discovered on the remaining left side of her thyroid . Her thyroid  function tests showed a very low TSH, and she is awaiting an endocrinologist appointment. Her methimazole  was discontinued during hospitalization. She is currently taking levothyroxine 25 mcg daily and reports diarrhea as a side effect.  Her hemoglobin was noted to be low at 10.7, which may contribute to her fatigue. She has previously taken iron supplements but discontinued them due to digestive issues. Her creatinine levels were elevated during hospitalization but improved upon discharge. She is monitoring her weight daily to check for fluid retention.  She reports a decreased appetite and weight loss, currently weighing 102 pounds, which may be related to fluid removal or thyroid  issues. Her family observes that she coughs after eating a few bites, which disrupts her meals.  She has experienced hearing loss and is undergoing treatment to address it. She uses ear drops to soften earwax and is considering hearing aids if the treatment is ineffective.  She has stopped using Afrin and is taking metoprolol  and furosemide  as prescribed.   Past  Medical History:  Diagnosis Date   Acute on chronic diastolic CHF (congestive heart failure), NYHA class 1 (HCC) 04/29/2013   Allergic rhinitis    Brady-tachy syndrome (HCC)    s/p STJ  dual chamber PPM by Dr Alvis Ba   CAD (coronary artery disease)    70% RCA stenosis, treated medically   Control of atrial fibrillation with pacemaker (HCC)    Diabetes mellitus (HCC)    type 2   DJD (degenerative joint disease)    Dyspnea    occasional   Hearing loss    Right ear only - no hearing aids   Hypertension    Hyperthyroidism    Paroxysmal atrial fibrillation (HCC)    Pacemaker St Jude   Pneumonia    years ago- before 2012 per patient   Polio    as a child   PONV (postoperative nausea and vomiting)    Presence of permanent cardiac pacemaker    St Jude pacemaker   Pulmonary disease    Sleep apnea    uses  CPAP nightly   Past Surgical History:  Procedure Laterality Date   arm surgery d/t fx Right 1996   ATRIAL FIBRILLATION ABLATION  07/31/2012; 08-10-13   PVI by Dr Nunzio Belch   ATRIAL FIBRILLATION ABLATION N/A 07/31/2012   Procedure: ATRIAL FIBRILLATION ABLATION;  Surgeon: Jolly Needle, MD;  Location: [facility] CATH LAB;  Service: Cardiovascular;  Laterality: N/A;   ATRIAL FIBRILLATION ABLATION N/A 08/10/2013   Procedure: ATRIAL FIBRILLATION ABLATION;  Surgeon: Ellaree Gunther, MD;  Location: [facility] CATH LAB;  Service: Cardiovascular;  Laterality: N/A;   BACK SURGERY  07/26/2012   dR. nUDELMAN   BIOPSY  06/30/2023   Procedure: BIOPSY;  Surgeon: Vinetta Greening, DO;  Location: [facility] ENDO SUITE;  Service: Endoscopy;;   CARDIAC CATHETERIZATION  12/03/2010   single vessel mid RCA   CARDIAC ELECTROPHYSIOLOGY MAPPING AND ABLATION  07/31/2012   Dr. Jolly Needle   CESAREAN SECTION  09/20/1974   Dr. Vearl Georgia   CHOLECYSTECTOMY  1994   COLONOSCOPY WITH PROPOFOL  N/A 03/21/2015   Procedure: COLONOSCOPY WITH PROPOFOL ;  Surgeon: Tami Falcon, MD;  Location: [facility] ENDOSCOPY;  Service: Endoscopy;  Laterality: N/A;   COLONOSCOPY WITH PROPOFOL  N/A 06/30/2023   Procedure: COLONOSCOPY WITH PROPOFOL ;  Surgeon: Vinetta Greening, DO;  Location: [facility] ENDO SUITE;  Service: Endoscopy;  Laterality: N/A;  11:15 AM,  ASA 4   DIAGNOSTIC MAMMOGRAM  04/16/2017   Samson Croak. Morris, DO   ESOPHAGOGASTRODUODENOSCOPY (EGD) WITH PROPOFOL  N/A 06/30/2023   Procedure: ESOPHAGOGASTRODUODENOSCOPY (EGD) WITH PROPOFOL ;  Surgeon: Vinetta Greening, DO;  Location: [facility] ENDO SUITE;  Service: Endoscopy;  Laterality: N/A;  11:15 AM, ASA 4   EYE SURGERY Bilateral    remove cataracts   LAPAROSCOPIC HYSTERECTOMY  Late 70s to early 80s   LEFT HEART CATH AND CORONARY ANGIOGRAPHY N/A 07/30/2017   Procedure: LEFT HEART CATH AND CORONARY ANGIOGRAPHY;  Surgeon: Millicent Ally, MD;  Location: [facility] INVASIVE CV LAB;  Service: Cardiovascular;  Laterality: N/A;   LUMBAR LAMINECTOMY/DECOMPRESSION MICRODISCECTOMY Right 12/23/2012   Procedure: Right L5-S1 Laminotomy for resection of synovial cyst;  Surgeon: Corrina Dimitri, MD;  Location: [facility] NEURO ORS;  Service: Neurosurgery;  Laterality: Right;  Right Lumbar five-sacral one Laminotomy for resection of synovial cyst   NM MYOCAR PERF WALL MOTION  12/20/2010   normal   PACEMAKER IMPLANT N/A 08/09/2019   Procedure: PACEMAKER IMPLANT;  Surgeon: Luana Rumple, MD;  Location: [facility] INVASIVE CV LAB;  Service: Cardiovascular;  Laterality: N/A;   PACEMAKER INSERTION  12/03/2010   SJM Accent DR RF implanted by Dr Alvis Ba   POLYPECTOMY  06/30/2023   Procedure: POLYPECTOMY;  Surgeon: Vinetta Greening, DO;  Location: [facility] ENDO SUITE;  Service: Endoscopy;;   RIGHT HEART CATH N/A 04/22/2018   Procedure: RIGHT HEART CATH;  Surgeon: Wenona Hamilton, MD;  Location: [facility] INVASIVE CV LAB;  Service: Cardiovascular;  Laterality: N/A;   RIGHT HEART CATH N/A 07/09/2022   Procedure: RIGHT HEART CATH;  Surgeon: Mardell Shade, MD;  Location: [facility] INVASIVE CV LAB;  Service: Cardiovascular;  Laterality: N/A;   TEE WITHOUT CARDIOVERSION N/A 07/30/2012   Procedure: TRANSESOPHAGEAL ECHOCARDIOGRAM (TEE);  Surgeon: Luana Rumple, MD;  Location: [facility] ENDOSCOPY;  Service: Cardiovascular;  Laterality: N/A;  h&p in file-hope   TEE  WITHOUT CARDIOVERSION N/A 08/09/2013   Procedure: TRANSESOPHAGEAL ECHOCARDIOGRAM (TEE);  Surgeon: Lenise Quince, MD;  Location: [facility] ENDOSCOPY;  Service: Cardiovascular;  Laterality: N/A;   THYROID  LOBECTOMY Right 09/20/2022   Procedure: RIGHT HEMITHYROIDECTOMY;  Surgeon: Daleen Dubs, DO;  Location: [facility] OR;  Service: ENT;  Laterality: Right;   TOTAL ABDOMINAL HYSTERECTOMY  1981   ULTRASOUND GUIDANCE FOR VASCULAR ACCESS  04/22/2018   Procedure: Ultrasound Guidance For Vascular Access;  Surgeon: Wenona Hamilton, MD;  Location: [facility] INVASIVE CV LAB;  Service: Cardiovascular;;    Allergies  Allergen Reactions   Codeine Nausea And Vomiting and Nausea Only   Demerol  Hcl [Meperidine ] Nausea Only   Ketorolac  Nausea And Vomiting, Nausea Only and Other (See Comments)    "pt told to never take this med"   Demerol  Nausea And Vomiting   Multaq  [Dronedarone ] Other (See Comments)    Increased fluid retention   Flexeril  [Cyclobenzaprine ] Nausea And Vomiting   Meperidine  Hcl    Xarelto [Rivaroxaban] Diarrhea   Latex Rash    Pt stated on 09/20/2022    Outpatient Encounter Medications as of 11/07/2023  Medication Sig   acetaminophen  (TYLENOL ) 500 MG tablet Take 500-1,000 mg by mouth every 6 (six) hours as needed (pain.).   apixaban  (ELIQUIS ) 5 MG TABS tablet TAKE ONE TABLET BY MOUTH TWICE DAILY   Cholecalciferol  (VITAMIN D3) 1000 UNITS CAPS Take 1,000 Units by mouth in the morning.   furosemide  (LASIX ) 80 MG tablet Take 40 mg by mouth in the morning and at bedtime. 1/2 tablet morning and night   glucose blood (ONETOUCH ULTRA) test strip USE DAILY TO CHECK BLOOD SUGAR E11.8   Lancets Misc. (ONE TOUCH SURESOFT) MISC Use to test  Blood sugar once daily DX: E11.8,   levothyroxine (SYNTHROID) 25 MCG tablet Take 25 mcg by mouth daily before breakfast.   magnesium  oxide (MAG-OX) 400 (240 Mg) MG tablet Take 400 mg by mouth daily.   metoprolol  tartrate (LOPRESSOR ) 25 MG tablet Take 12.5 mg by mouth in the  morning and at bedtime.   ondansetron  (ZOFRAN ) 4 MG tablet Take 1 tablet (4 mg total) by mouth every 8 (eight) hours as needed for nausea or vomiting.   oxymetazoline  (AFRIN NASAL SPRAY) 0.05 % nasal spray Place 1 spray into both nostrils 2 (two) times daily.   pantoprazole  (PROTONIX ) 40 MG tablet Take 1 tablet (40 mg total) by mouth 2 (two) times daily before a meal.   potassium chloride  SA (KLOR-CON  M) 20 MEQ tablet TAKE ONE TABLET BY MOUTH TWICE DAILY   sildenafil  (REVATIO ) 20 MG tablet TAKE ONE TABLET BY MOUTH 3 TIMES DAILY   Sod Picosulfate-Mag Ox-Cit Acd (CLENPIQ ) 10-3.5-12 MG-GM -  GM/175ML SOLN Take 1 kit by mouth as directed.   sodium chloride  (NASAL MOISTURIZING SPRAY) 0.65 % nasal spray Place 1 spray into the nose as needed for congestion.   spironolactone  (ALDACTONE ) 25 MG tablet TAKE ONE TABLET BY MOUTH EVERY DAY   tizanidine (ZANAFLEX) 2 MG capsule Take 2 mg by mouth at bedtime.   traMADol  (ULTRAM ) 50 MG tablet Take 50-100 mg by mouth every 8 (eight) hours as needed.   [DISCONTINUED] fluticasone  (FLONASE ) 50 MCG/ACT nasal spray Place 2 sprays into both nostrils daily. (Patient not taking: Reported on 11/07/2023)   [DISCONTINUED] lidocaine  (HM LIDOCAINE  PATCH) 4 % Place 1 patch onto the skin daily. (Patient not taking: Reported on 11/07/2023)   No facility-administered encounter medications on file as of 11/07/2023.    Review of Systems  Constitutional:  Positive for fatigue and unexpected weight change. Negative for appetite change, chills and fever.       Weight loss   HENT:  Negative for congestion, dental problem, ear discharge, ear pain, facial swelling, hearing loss, nosebleeds, postnasal drip, rhinorrhea, sinus pressure, sinus pain, sneezing, sore throat, tinnitus and trouble swallowing.   Eyes:  Negative for pain, discharge, redness, itching and visual disturbance.  Respiratory:  Positive for cough. Negative for chest tightness, shortness of breath and wheezing.        Dyspnea  with exertion   Cardiovascular:  Positive for leg swelling. Negative for chest pain and palpitations.  Gastrointestinal:  Negative for abdominal distention, abdominal pain, blood in stool, constipation, diarrhea, nausea and vomiting.  Endocrine: Negative for cold intolerance, heat intolerance, polydipsia, polyphagia and polyuria.  Genitourinary:  Negative for difficulty urinating, dysuria, flank pain, frequency and urgency.  Musculoskeletal:  Positive for gait problem. Negative for arthralgias, back pain, joint swelling, myalgias, neck pain and neck stiffness.  Skin:  Negative for color change, pallor, rash and wound.  Neurological:  Negative for dizziness, syncope, speech difficulty, weakness, light-headedness, numbness and headaches.  Hematological:  Does not bruise/bleed easily.  Psychiatric/Behavioral:  Negative for agitation, behavioral problems, confusion, hallucinations, self-injury, sleep disturbance and suicidal ideas. The patient is not nervous/anxious.     Immunization History  Administered Date(s) Administered   Fluad Quad(high Dose 65+) 01/21/2019, 03/27/2020, 05/22/2021, 03/06/2022   Fluad Trivalent(High Dose 65+) 04/15/2023   Influenza Whole 02/19/2013   Influenza, High Dose Seasonal PF 02/09/2018   PFIZER(Purple Top)SARS-COV-2 Vaccination 07/08/2019, 07/29/2019   PNEUMOCOCCAL CONJUGATE-20 05/13/2023   Pfizer Covid-19 Vaccine Bivalent Booster 76yrs & up 05/12/2020, 05/04/2021   Pneumococcal Polysaccharide-23 02/09/2018   Pneumococcal-Unspecified 04/29/2011   Tdap 03/05/2013   Pertinent  Health Maintenance Due  Topic Date Due   HEMOGLOBIN A1C  11/11/2023   FOOT EXAM  11/13/2023   INFLUENZA VACCINE  01/02/2024   OPHTHALMOLOGY EXAM  01/03/2024   DEXA SCAN  Completed   Colonoscopy  Discontinued      05/28/2022    2:29 PM 04/15/2023    2:05 PM 05/13/2023    1:43 PM 06/10/2023    2:46 PM 06/18/2023    1:09 PM  Fall Risk  Falls in the past year? 0 0 0 0 0  Was there an  injury with Fall? 0 0 0 0 0  Fall Risk Category Calculator 0 0 0 0 0  Fall Risk Category (Retired) Low      (RETIRED) Patient Fall Risk Level Low fall risk      Patient at Risk for Falls Due to No Fall Risks No Fall Risks No Fall Risks No Fall Risks  No Fall Risks  Fall risk Follow up Falls evaluation completed Falls evaluation completed;Education provided;Falls prevention discussed Falls evaluation completed;Education provided;Falls prevention discussed Falls evaluation completed;Education provided;Falls prevention discussed Falls evaluation completed   Functional Status Survey:    Vitals:   11/07/23 1425  BP: 132/68  Pulse: (!) 58  Temp: (!) 97.1 F (36.2 C)  TempSrc: Temporal  SpO2: 97%  Weight: 102 lb (46.3 kg)  Height: 4\' 11"  (1.499 m)   Body mass index is 20.6 kg/m. Physical Exam MEASUREMENTS: Weight- 102. GENERAL: Alert, cooperative, well developed, no acute distress HEENT: Normocephalic, normal oropharynx, moist mucous membranes, ears normal, nose normal, eyes normal NECK: Neck normal CHEST: Clear to auscultation bilaterally, no wheezes, rhonchi, or crackles, lungs normal CARDIOVASCULAR: Normal heart rate and rhythm, S1 and S2 normal without murmurs ABDOMEN: Soft, tender, non-distended, without organomegaly, normal bowel sounds EXTREMITIES: No cyanosis or edema, extremities normal NEUROLOGICAL: Cranial nerves grossly intact, moves all extremities without gross motor or sensory deficit  SKIN: No rash,no lesion or erythema   PSYCHIATRY/BEHAVIORAL: Mood stable   Labs reviewed: Recent Labs    11/19/22 1418 01/13/23 1300 04/15/23 1544 05/12/23 1626 05/13/23 1418 05/25/23 1030 11/07/23 1611  NA 135   < > 135   < > 136 132* 134*  K 4.3  --  4.0   < > 4.6 4.2 4.5  CL 95*  --  96*   < > 100 97* 96*  CO2 22  --  23   < > 27 25 25   GLUCOSE 94   < > 112*   < > 96 123* 112*  BUN 20   < > 15   < > 13 17 20   CREATININE 1.41*   < > 1.08*   < > 1.09* 1.13* 0.94  CALCIUM  9.0   --  9.5   < > 9.4 9.1 9.7  MG 2.5*  --  2.2  --   --   --   --    < > = values in this interval not displayed.   Recent Labs    05/25/23 1030 11/07/23 1611  AST 13* 13  ALT 9 9  ALKPHOS 92  --   BILITOT 1.0 1.3*  PROT 6.9 6.5  ALBUMIN  3.9  --    Recent Labs    04/15/23 1544 05/13/23 1418 05/25/23 1030 08/11/23 1514 11/07/23 1611  WBC 7.0   < > 5.6 7.5 7.3  NEUTROABS 4,949  --   --  5.6 5,161  HGB 11.2*   < > 10.6* 10.3* 11.7  HCT 38.6   < > 37.9 35.4 40.0  MCV 78.9*   < > 81.3 80 80.0  PLT 350   < > 324 353 329   < > = values in this interval not displayed.   Lab Results  Component Value Date   TSH 4.650 (H) 05/12/2023   Lab Results  Component Value Date   HGBA1C 7.3 (H) 05/13/2023   Lab Results  Component Value Date   CHOL 125 11/13/2020   HDL 50 11/13/2020   LDLCALC 60 11/13/2020   TRIG 70 11/13/2020   CHOLHDL 2.5 11/13/2020    Significant Diagnostic Results in last 30 days:  DG Chest 2 View Result Date: 11/08/2023 CLINICAL DATA:  Cough. EXAM: CHEST - 2 VIEW COMPARISON:  None Available. FINDINGS: No focal consolidation, pleural effusion, pneumothorax. The cardiac silhouette is within limits. Left pectoral pacemaker device. No acute osseous pathology. IMPRESSION: No active cardiopulmonary disease. Electronically Signed  By: Angus Bark M.D.   On: 11/08/2023 16:21   CUP PACEART REMOTE DEVICE CHECK Result Date: 11/05/2023 PPM Scheduled remote reviewed. Normal device function.  Presenting rhythm: VP. Next remote 91 days. - CS, CVRS   Assessment/Plan  Congestive Heart Failure Chronic congestive heart failure with recent exacerbation requiring hospitalization for diuresis. Symptoms include shortness of breath, fatigue, and fluid retention. BNP was 45,512, indicating significant heart failure. Fluid overload managed with furosemide . Current symptoms include chest tightness and fatigue, possibly due to fluid retention. - Continue furosemide  40 mg in the  morning and 40 mg in the afternoon. - Order chest x-ray to evaluate for pneumonia and pleural effusion. - Monitor weight daily to assess for fluid retention. - Follow up with cardiologist on June 11.  Thyroid  Nodule Nodules on the remaining left thyroid  lobe post-partial thyroidectomy, possibly compressing the esophagus, contributing to coughing and dysphagia. TSH levels are very low, indicating possible over-suppression of thyroid  function. She has not seen an endocrinologist in over a year, necessitating follow-up for thyroid  function and nodule management. - Reduce levothyroxine to 12.5 mcg until endocrinologist evaluation though POA prefers to continue on current 25 mcg tablet since Methimazole  was discontinued during hospitalization. - Follow up with endocrinologist to assess thyroid  function and nodule management. - Consider surgical evaluation for thyroid  nodules if recommended by endocrinologist.  Chronic Cough Chronic cough, particularly postprandial, possibly related to thyroid  nodules compressing the esophagus. No fever or signs of infection. Coughing occurs after consuming small bites of food, potentially due to esophageal compression by the nodules. - Encourage small, soft meals to minimize coughing during eating. - Consider Ensure or protein supplements between meals to maintain nutrition.  Anemia Hemoglobin level of 10.7, indicating mild anemia, which may contribute to fatigue. Iron supplementation was previously discontinued due to gastrointestinal side effects. She is not currently on iron supplements due to digestive issues. - Recheck hemoglobin levels to monitor for improvement or further decline.  General Health Maintenance General health maintenance discussed in the context of managing chronic conditions and ensuring follow-up with specialists. - Encourage use of ear drops to manage cerumen impaction and improve hearing.  Follow-up Follow-up plans discussed to ensure  continuity of care and management of chronic conditions. She has an upcoming appointment with the cardiologist and needs to schedule an appointment with the endocrinologist. - Schedule appointment with endocrinologist as soon as possible. - Follow up with surgeon regarding thyroid  nodules if recommended by endocrinologist. - Recheck lab work for kidney function and hemoglobin levels. - Follow up with primary care on July 29.   Family/ staff Communication: Reviewed plan of care with patient,son and daughter verbalized understanding   Labs/tests ordered:  - CBC/diff - CMP with GFR - Chest X-ray 2 views   Next Appointment: Return if symptoms worsen or fail to improve.   Total time: 42 minutes. Greater than 50% of total time spent doing patient education regarding CHF,Thyroid  nodule,weight loss, Anemia, health maintenance including symptom/medication management.   Estil Heman, NP

## 2023-11-10 ENCOUNTER — Ambulatory Visit: Payer: Self-pay | Admitting: Family

## 2023-11-10 NOTE — Progress Notes (Unsigned)
 Cardiology Office Note    Date:  11/10/2023  ID:  Krysti, Hickling [birth year], MRN [id number] PCP:  Tye Gall, MD  Cardiologist:  Luana Rumple, MD  Electrophysiologist:  None   Chief Complaint: ***  History of Present Illness: .    CHRYS LANDGREBE is a 78 y.o. female with visit-pertinent history of longstanding persistent atrial fibrillation s/p RF ablation procedures, now with longstanding persistent atrial fibrillation, s/p dual-chamber permanent pacemaker ([employer] Accent 2012 for tachycardia-bradycardia syndrome and 2:1 atrioventricular block, generator change and new right ventricular lead for high pacing thresholds in March 2021), chronic diastolic heart failure (hx of acute decompensation on Multaq ), multifactorial pulmonary artery hypertension, CAD (nonobstructive by cath 07/2017), type 2 diabetes mellitus, hypertension, s/p right hemithyroidectomy for large goiter hyperthyroidism.   Echocardiogram in July 2020 showed normal LV systolic function and EF of 60 to 65%, pseudo normal mitral inflow, moderately enlarged right ventricle, biatrial mild dilation, estimated systolic PA pressure 40 mmHg.  Previous right heart catheterization showed that her pulmonary hypertension could not be explained exclusively based on elevated left heart filling pressures.  Repeat echo in 06/2022 showed evidence of dilation weakening of the right ventricle and right heart catheterization showed PA pressure 73/27 (mean 41), wedge pressure 23, transpulmonary gradient 18, PVR 5.6, cardiac index 2.1, PAP 3.8, mixed venous saturation 56-58%.   She was last seen in clinic on 05/12/2023 by Dr. Alvis Ba.  She had overall been stable.  She denied any lower extremity edema, frank orthopnea or PND, noted that she does routinely sleep elevated on a wedge.  She denies any dyspnea with routine household activities and denies chest pain.  Her SGLT2 inhibitor was discontinued previously due to recurrent genital yeast  infections.  It was noted that she had not been using her CPAP as she was attending to her husband's medical problems in the setting of progressive dementia.  She is followed by Dr. Baldwin Levee with pulmonology.  On 11/02/2023 patient notified the office that she was having increased lower extremity edema and chest tightness.  On chart review appears that she presented to [facility].  She was admitted on 11/02/2023 and discharged the following day, she presented to the ED with complaints of dyspnea, subdiaphragmatic pain.  She reported that symptoms have been ongoing for 2 and half weeks, reported pain under her diaphragm bilaterally that was worse with coughing or taking a deep breath.  Reported nonproductive cough for the past 6 months however in the past several weeks have been feeling increasingly dyspneic with mild exertion of less than 20 feet and with worsened orthopnea.  Patient reported feeling as though she was fluid overloaded however weight had decreased over the past several month from 114 pounds to 106 pounds.  CT showed pulmonary edema, hs Trop 15, Pro BNP elevated to 4,512, patient treated with IV Lasix .  Echocardiogram indicated LVEF of greater than 55%, LV with normal size and wall thickness, mild aortic regurgitation, left atrium was mildly dilated, RV was mildly rightly dilated in size with normal systolic function, there is moderate tricuspid regurgitation, moderate pulmonary hypertension.   Today she presents for follow-up.  She reports that she   CHF:  PAH: Permanent afib:  SSS/PPM:  CAD:  DM: Goiter: S/p hemithyroidectomy.  Labwork independently reviewed: 11/02/23: Hemoglobin 10.7, hematocrit 34.8 11/03/2023: Sodium 137, potassium 3.5, creatinine 1.22 ROS: .   *** denies chest pain, shortness of breath, lower extremity edema, fatigue, palpitations, melena, hematuria, hemoptysis, diaphoresis, weakness, presyncope, syncope, orthopnea,  and PND.  All other systems are reviewed and  otherwise negative.  Studies Reviewed: Aaron Aas    EKG:  EKG is ordered today, personally reviewed, demonstrating ***     CV Studies: Cardiac studies reviewed are outlined and summarized above. Otherwise please see EMR for full report. Cardiac Studies & Procedures   ______________________________________________________________________________________________ CARDIAC CATHETERIZATION  CARDIAC CATHETERIZATION 07/09/2022  Conclusion Findings:  RA = 12 RV = 76/11 PA = 73/27 (41) PCW = 23 Fick cardiac output/index = 3.2/2.1 PVR = 5.6 WU Ao sat = 96% PA sat = 56%, 58% PAPi = 3.8  Assessment: 1. Mixed moderate pulmonary venous and pulmonary arterial HTN with elevated filling pressures and low cardiac output  Plan/Discussion:  Options limited. Not candidate for selective pulmonary artery vasodilators. Would push diuretics as tolerated. Ensure oxygenation > 88% at all times. Consider pulmonary rehab.  Jules Oar, MD 9:43 AM   CARDIAC CATHETERIZATION  CARDIAC CATHETERIZATION 04/22/2018  Conclusion 1.  Mildly elevated left-sided filling pressures. 2.  Moderate to severe pulmonary hypertension with normal cardiac output.  Pulmonary capillary wedge pressure: 19 mmHg. PA pressure: 68/28 with a mean of 43 mmHg. Pulmonary vascular resistance: 4.9 Woods units Cardiac index was 2.97.  Recommendations: The patient seems to have mixed pulmonary hypertension due to left-sided heart failure and primary pulmonary hypertension.  She is mildly volume overloaded and I suspect that she can switch to oral furosemide  tomorrow. Consider vasodilator therapy for pulmonary hypertension.   STRESS TESTS  MYOCARDIAL PERFUSION IMAGING 05/30/2017  Narrative  The left ventricular ejection fraction is hyperdynamic (>65%).  Nuclear stress EF: 69%.  There was no ST segment deviation noted during stress.  Defect 1: There is a small defect of mild severity present in the mid anterior and apical  anterior location.  Findings consistent with ischemia.  This is a low risk study.  There is a mild severity, small area reversible defect in the mid and apical anterior walls consistent with mild ischemia (SDS = 2).   ECHOCARDIOGRAM  ECHOCARDIOGRAM COMPLETE 07/01/2022  Narrative ECHOCARDIOGRAM REPORT    Patient Name:   KAELEI WHEELER Date of Exam: 07/01/2022 Medical Rec #:  [id number]      Height:       59.0 in Accession #:    [id number]     Weight:       131.0 lb Date of Birth:  September 18, 1945      BSA:          1.541 m Patient Age:    76 years       BP:           120/74 mmHg Patient Gender: F              HR:           60 bpm. Exam Location:  [address]  Procedure: 2D Echo, Color Doppler, Cardiac Doppler, 3D Echo and Intracardiac Opacification Agent  Indications:    Dyspnea R06.00  History:        Patient has prior history of Echocardiogram examinations, most recent 12/16/2018. CHF, CAD, Pacemaker, Arrythmias:Atrial Fibrillation; Risk Factors:Hypertension and Diabetes.  Sonographer:    Joleen Navy RDCS Referring Phys: [phone] MIHAI CROITORU  IMPRESSIONS   1. Evidence for moderate pulmonary HTN Estimated PAs pressure 62 mmHg. 2. Left ventricular ejection fraction, by estimation, is 60 to 65%. The left ventricle has normal function. The left ventricle has no regional wall motion abnormalities. Left ventricular diastolic parameters were normal. 3. Catheter in RA/RV .  Right ventricular systolic function is moderately reduced. The right ventricular size is normal. There is moderately elevated pulmonary artery systolic pressure. 4. Left atrial size was mildly dilated. 5. Right atrial size was moderately dilated. 6. The mitral valve is abnormal. No evidence of mitral valve regurgitation. No evidence of mitral stenosis. 7. Tricuspid valve regurgitation is severe. 8. The aortic valve is tricuspid. Aortic valve regurgitation is trivial. No aortic stenosis is present. 9. The  inferior vena cava is dilated in size with <50% respiratory variability, suggesting right atrial pressure of 15 mmHg.  FINDINGS Left Ventricle: Left ventricular ejection fraction, by estimation, is 60 to 65%. The left ventricle has normal function. The left ventricle has no regional wall motion abnormalities. Definity  contrast agent was given IV to delineate the left ventricular endocardial borders. The left ventricular internal cavity size was normal in size. There is no left ventricular hypertrophy. Left ventricular diastolic parameters were normal.  Right Ventricle: Catheter in RA/RV. The right ventricular size is normal. No increase in right ventricular wall thickness. Right ventricular systolic function is moderately reduced. There is moderately elevated pulmonary artery systolic pressure. The tricuspid regurgitant velocity is 3.45 m/s, and with an assumed right atrial pressure of 8 mmHg, the estimated right ventricular systolic pressure is 55.6 mmHg.  Left Atrium: Left atrial size was mildly dilated.  Right Atrium: Right atrial size was moderately dilated.  Pericardium: There is no evidence of pericardial effusion.  Mitral Valve: The mitral valve is abnormal. There is mild thickening of the mitral valve leaflet(s). Mild mitral annular calcification. No evidence of mitral valve regurgitation. No evidence of mitral valve stenosis.  Tricuspid Valve: The tricuspid valve is normal in structure. Tricuspid valve regurgitation is severe. No evidence of tricuspid stenosis.  Aortic Valve: The aortic valve is tricuspid. Aortic valve regurgitation is trivial. No aortic stenosis is present.  Pulmonic Valve: The pulmonic valve was normal in structure. Pulmonic valve regurgitation is trivial. No evidence of pulmonic stenosis.  Aorta: The aortic root is normal in size and structure.  Venous: The inferior vena cava is dilated in size with less than 50% respiratory variability, suggesting right atrial  pressure of 15 mmHg.  IAS/Shunts: No atrial level shunt detected by color flow Doppler.  Additional Comments: Evidence for moderate pulmonary HTN Estimated PAs pressure 62 mmHg.   LEFT VENTRICLE PLAX 2D LVIDd:         4.00 cm   Diastology LVIDs:         2.90 cm   LV e' medial:    6.40 cm/s LV PW:         0.90 cm   LV E/e' medial:  14.9 LV IVS:        0.90 cm   LV e' lateral:   7.80 cm/s LVOT diam:     1.90 cm   LV E/e' lateral: 12.3 LV SV:         42 LV SV Index:   27 LVOT Area:     2.84 cm   RIGHT VENTRICLE RV Basal diam:  4.40 cm RV Mid diam:    4.30 cm RV S prime:     5.43 cm/s TAPSE (M-mode): 2.2 cm  LEFT ATRIUM             Index        RIGHT ATRIUM           Index LA diam:        4.20 cm 2.73 cm/m   RA Area:  25.00 cm LA Vol (A2C):   42.8 ml 27.78 ml/m  RA Volume:   82.50 ml  53.54 ml/m LA Vol (A4C):   63.6 ml 41.28 ml/m LA Biplane Vol: 54.5 ml 35.37 ml/m AORTIC VALVE LVOT Vmax:   68.70 cm/s LVOT Vmean:  44.000 cm/s LVOT VTI:    0.148 m  AORTA Ao Root diam: 2.60 cm Ao Asc diam:  2.80 cm  MITRAL VALVE               TRICUSPID VALVE MV Area (PHT): 4.49 cm    TR Peak grad:   47.6 mmHg MV Decel Time: 169 msec    TR Vmax:        345.00 cm/s MV E velocity: 95.60 cm/s MV A velocity: 32.60 cm/s  SHUNTS MV E/A ratio:  2.93        Systemic VTI:  0.15 m Systemic Diam: 1.90 cm  Janelle Mediate MD Electronically signed by Janelle Mediate MD Signature Date/Time: 07/01/2022/2:01:23 PM    Final          ______________________________________________________________________________________________       Current Reported Medications:.    No outpatient medications have been marked as taking for the 11/13/23 encounter (Appointment) with Marielle Mantione D, NP.    Physical Exam:    VS:  There were no vitals taken for this visit.   Wt Readings from Last 3 Encounters:  11/07/23 102 lb (46.3 kg)  08/26/23 114 lb 3.2 oz (51.8 kg)  08/11/23 116 lb 12.8 oz (53 kg)     GEN: Well nourished, well developed in no acute distress NECK: No JVD; No carotid bruits CARDIAC: ***RRR, no murmurs, rubs, gallops RESPIRATORY:  Clear to auscultation without rales, wheezing or rhonchi  ABDOMEN: Soft, non-tender, non-distended EXTREMITIES:  No edema; No acute deformity     Asessement and Plan:.     ***     Disposition: F/u with ***  Signed, Rajohn Henery D Jaquay Morneault, NP

## 2023-11-11 ENCOUNTER — Encounter: Payer: Self-pay | Admitting: Cardiovascular Disease

## 2023-11-13 ENCOUNTER — Ambulatory Visit: Attending: Cardiology | Admitting: Cardiology

## 2023-11-13 ENCOUNTER — Encounter: Payer: Self-pay | Admitting: Cardiology

## 2023-11-13 VITALS — BP 116/60 | HR 61 | Ht 59.0 in | Wt 101.0 lb

## 2023-11-13 DIAGNOSIS — I5032 Chronic diastolic (congestive) heart failure: Secondary | ICD-10-CM | POA: Diagnosis not present

## 2023-11-13 DIAGNOSIS — E118 Type 2 diabetes mellitus with unspecified complications: Secondary | ICD-10-CM | POA: Diagnosis not present

## 2023-11-13 DIAGNOSIS — I4821 Permanent atrial fibrillation: Secondary | ICD-10-CM | POA: Insufficient documentation

## 2023-11-13 DIAGNOSIS — I495 Sick sinus syndrome: Secondary | ICD-10-CM | POA: Insufficient documentation

## 2023-11-13 DIAGNOSIS — E049 Nontoxic goiter, unspecified: Secondary | ICD-10-CM | POA: Diagnosis not present

## 2023-11-13 DIAGNOSIS — I2721 Secondary pulmonary arterial hypertension: Secondary | ICD-10-CM | POA: Diagnosis not present

## 2023-11-13 DIAGNOSIS — D6869 Other thrombophilia: Secondary | ICD-10-CM | POA: Insufficient documentation

## 2023-11-13 DIAGNOSIS — Z950 Presence of cardiac pacemaker: Secondary | ICD-10-CM | POA: Insufficient documentation

## 2023-11-13 DIAGNOSIS — I2510 Atherosclerotic heart disease of native coronary artery without angina pectoris: Secondary | ICD-10-CM | POA: Diagnosis not present

## 2023-11-13 DIAGNOSIS — Z79899 Other long term (current) drug therapy: Secondary | ICD-10-CM | POA: Insufficient documentation

## 2023-11-13 LAB — BASIC METABOLIC PANEL WITH GFR
BUN/Creatinine Ratio: 17 (ref 12–28)
BUN: 17 mg/dL (ref 8–27)
CO2: 23 mmol/L (ref 20–29)
Calcium: 9.5 mg/dL (ref 8.7–10.3)
Chloride: 93 mmol/L — ABNORMAL LOW (ref 96–106)
Creatinine, Ser: 1.01 mg/dL — ABNORMAL HIGH (ref 0.57–1.00)
Glucose: 84 mg/dL (ref 70–99)
Potassium: 4.3 mmol/L (ref 3.5–5.2)
Sodium: 133 mmol/L — ABNORMAL LOW (ref 134–144)
eGFR: 57 mL/min/{1.73_m2} — ABNORMAL LOW (ref >59)

## 2023-11-13 NOTE — Patient Instructions (Signed)
 Medication Instructions:  Your physician recommends that you continue on your current medications as directed. Please refer to the Current Medication list given to you today.  *If you need a refill on your cardiac medications before your next appointment, please call your pharmacy*  Lab Work: BMET and Pro-BNP today If you have labs (blood work) drawn today and your tests are completely normal, you will receive your results only by: MyChart Message (if you have MyChart) OR A paper copy in the mail If you have any lab test that is abnormal or we need to change your treatment, we will call you to review the results.  Testing/Procedures: None ordered.   Follow-Up: At [facility], you and your health needs are our priority.  As part of our continuing mission to provide you with exceptional heart care, our providers are all part of one team.  This team includes your primary Cardiologist (physician) and Advanced Practice Providers or APPs (Physician Assistants and Nurse Practitioners) who all work together to provide you with the care you need, when you need it.  Your next appointment:   8 week follow up appointment with Dr Alvis Ba or Katlyn West,NP  4-5 month follow up appointment with Dr Alvis Ba  We recommend signing up for the patient portal called MyChart.  Sign up information is provided on this After Visit Summary.  MyChart is used to connect with patients for Virtual Visits (Telemedicine).  Patients are able to view lab/test results, encounter notes, upcoming appointments, etc.  Non-urgent messages can be sent to your provider as well.   To learn more about what you can do with MyChart, go to [email or web address].

## 2023-11-14 ENCOUNTER — Ambulatory Visit: Payer: Self-pay | Admitting: Cardiology

## 2023-11-14 DIAGNOSIS — I4821 Permanent atrial fibrillation: Secondary | ICD-10-CM

## 2023-11-14 DIAGNOSIS — D6869 Other thrombophilia: Secondary | ICD-10-CM

## 2023-11-14 DIAGNOSIS — I2510 Atherosclerotic heart disease of native coronary artery without angina pectoris: Secondary | ICD-10-CM

## 2023-11-14 DIAGNOSIS — E049 Nontoxic goiter, unspecified: Secondary | ICD-10-CM

## 2023-11-14 DIAGNOSIS — Z950 Presence of cardiac pacemaker: Secondary | ICD-10-CM

## 2023-11-14 DIAGNOSIS — I5032 Chronic diastolic (congestive) heart failure: Secondary | ICD-10-CM

## 2023-11-14 DIAGNOSIS — I495 Sick sinus syndrome: Secondary | ICD-10-CM

## 2023-11-14 DIAGNOSIS — I2721 Secondary pulmonary arterial hypertension: Secondary | ICD-10-CM

## 2023-11-14 DIAGNOSIS — Z79899 Other long term (current) drug therapy: Secondary | ICD-10-CM

## 2023-11-14 LAB — PRO B NATRIURETIC PEPTIDE: NT-Pro BNP: 3383 pg/mL — ABNORMAL HIGH (ref 0–738)

## 2023-11-14 NOTE — Telephone Encounter (Signed)
-----   Message from Katlyn D West sent at 11/14/2023  5:19 PM EDT ----- Please let Ms. Ruest know that her BNP which indicates fluid status remains elevated. Her kidney function is stable, her potassium is normal. Recommend increasing her Furosemide  to 80 mg in the  morning and 40mg  in the afternoon. Please recheck NT- Pro BNP and BMET in one week. Please have her monitor her blood pressure at home and notify the office if she has any dizziness or  lightheadedness. Would also recommend moving up follow up to four weeks. Thank you!  ----- Message ----- From: Interface, [facility] Lab Results In Sent: 11/13/2023  11:36 PM EDT To: Katlyn D West, NP

## 2023-11-14 NOTE — Telephone Encounter (Signed)
 Left message to call back on mother, and Daughters phones

## 2023-11-17 DIAGNOSIS — E0520 Thyrotoxicosis with toxic multinodular goiter without thyrotoxic crisis or storm: Secondary | ICD-10-CM | POA: Diagnosis not present

## 2023-11-17 DIAGNOSIS — E049 Nontoxic goiter, unspecified: Secondary | ICD-10-CM | POA: Diagnosis not present

## 2023-11-20 DIAGNOSIS — M545 Low back pain, unspecified: Secondary | ICD-10-CM | POA: Diagnosis not present

## 2023-11-20 DIAGNOSIS — I5032 Chronic diastolic (congestive) heart failure: Secondary | ICD-10-CM | POA: Diagnosis not present

## 2023-11-20 DIAGNOSIS — I495 Sick sinus syndrome: Secondary | ICD-10-CM | POA: Diagnosis not present

## 2023-11-20 DIAGNOSIS — Z6823 Body mass index (BMI) 23.0-23.9, adult: Secondary | ICD-10-CM | POA: Diagnosis not present

## 2023-11-20 DIAGNOSIS — E119 Type 2 diabetes mellitus without complications: Secondary | ICD-10-CM | POA: Diagnosis not present

## 2023-11-20 DIAGNOSIS — G8929 Other chronic pain: Secondary | ICD-10-CM | POA: Diagnosis not present

## 2023-11-20 DIAGNOSIS — E0590 Thyrotoxicosis, unspecified without thyrotoxic crisis or storm: Secondary | ICD-10-CM | POA: Diagnosis not present

## 2023-11-20 DIAGNOSIS — I482 Chronic atrial fibrillation, unspecified: Secondary | ICD-10-CM | POA: Diagnosis not present

## 2023-11-20 DIAGNOSIS — M5136 Other intervertebral disc degeneration, lumbar region with discogenic back pain only: Secondary | ICD-10-CM | POA: Diagnosis not present

## 2023-11-24 NOTE — Telephone Encounter (Signed)
-----   Message from Katlyn D West sent at 11/14/2023  5:19 PM EDT ----- Please let Ms. Ruest know that her BNP which indicates fluid status remains elevated. Her kidney function is stable, her potassium is normal. Recommend increasing her Furosemide  to 80 mg in the  morning and 40mg  in the afternoon. Please recheck NT- Pro BNP and BMET in one week. Please have her monitor her blood pressure at home and notify the office if she has any dizziness or  lightheadedness. Would also recommend moving up follow up to four weeks. Thank you!  ----- Message ----- From: Interface, [facility] Lab Results In Sent: 11/13/2023  11:36 PM EDT To: Katlyn D West, NP

## 2023-11-24 NOTE — Telephone Encounter (Signed)
 Called patient advised of below they verbalized understanding.

## 2023-11-25 ENCOUNTER — Telehealth: Payer: Self-pay | Admitting: Cardiovascular Disease

## 2023-11-25 NOTE — Telephone Encounter (Signed)
 Spoke with patient and she is aware of Katlyn's recommendations. She verbalized understanding

## 2023-11-25 NOTE — Telephone Encounter (Signed)
 Pt c/o medication issue:  1. Name of Medication: furosemide  (LASIX ) 80 MG tablet   2. How are you currently taking this medication (dosage and times per day)? As written   3. Are you having a reaction (difficulty breathing--STAT)? No   4. What is your medication issue? Pt was told to start talking 120 MG but she would like some clarification first. Please advise

## 2023-11-25 NOTE — Telephone Encounter (Signed)
Duplicate encounter - please see previous encounter.

## 2023-11-26 ENCOUNTER — Other Ambulatory Visit: Payer: Self-pay | Admitting: Cardiovascular Disease

## 2023-11-26 DIAGNOSIS — I272 Pulmonary hypertension, unspecified: Secondary | ICD-10-CM

## 2023-11-29 ENCOUNTER — Other Ambulatory Visit: Payer: Self-pay | Admitting: Gastroenterology

## 2023-11-29 DIAGNOSIS — R6881 Early satiety: Secondary | ICD-10-CM

## 2023-11-29 DIAGNOSIS — R110 Nausea: Secondary | ICD-10-CM

## 2023-12-04 DIAGNOSIS — Z950 Presence of cardiac pacemaker: Secondary | ICD-10-CM | POA: Diagnosis not present

## 2023-12-04 DIAGNOSIS — I495 Sick sinus syndrome: Secondary | ICD-10-CM | POA: Diagnosis not present

## 2023-12-04 DIAGNOSIS — I2510 Atherosclerotic heart disease of native coronary artery without angina pectoris: Secondary | ICD-10-CM | POA: Diagnosis not present

## 2023-12-04 DIAGNOSIS — I5032 Chronic diastolic (congestive) heart failure: Secondary | ICD-10-CM | POA: Diagnosis not present

## 2023-12-04 DIAGNOSIS — Z79899 Other long term (current) drug therapy: Secondary | ICD-10-CM | POA: Diagnosis not present

## 2023-12-04 DIAGNOSIS — I2721 Secondary pulmonary arterial hypertension: Secondary | ICD-10-CM | POA: Diagnosis not present

## 2023-12-04 DIAGNOSIS — E049 Nontoxic goiter, unspecified: Secondary | ICD-10-CM | POA: Diagnosis not present

## 2023-12-04 DIAGNOSIS — I4821 Permanent atrial fibrillation: Secondary | ICD-10-CM | POA: Diagnosis not present

## 2023-12-04 DIAGNOSIS — D6869 Other thrombophilia: Secondary | ICD-10-CM | POA: Diagnosis not present

## 2023-12-05 LAB — BASIC METABOLIC PANEL WITH GFR
BUN/Creatinine Ratio: 13 (ref 12–28)
BUN: 16 mg/dL (ref 8–27)
CO2: 26 mmol/L (ref 20–29)
Calcium: 10 mg/dL (ref 8.7–10.3)
Chloride: 88 mmol/L — ABNORMAL LOW (ref 96–106)
Creatinine, Ser: 1.2 mg/dL — ABNORMAL HIGH (ref 0.57–1.00)
Glucose: 162 mg/dL — ABNORMAL HIGH (ref 70–99)
Potassium: 4.5 mmol/L (ref 3.5–5.2)
Sodium: 130 mmol/L — ABNORMAL LOW (ref 134–144)
eGFR: 46 mL/min/1.73 — ABNORMAL LOW (ref >59)

## 2023-12-05 LAB — PRO B NATRIURETIC PEPTIDE: NT-Pro BNP: 4891 pg/mL — ABNORMAL HIGH (ref 0–738)

## 2023-12-09 ENCOUNTER — Other Ambulatory Visit (HOSPITAL_COMMUNITY): Payer: Self-pay | Admitting: Otolaryngology

## 2023-12-09 DIAGNOSIS — E042 Nontoxic multinodular goiter: Secondary | ICD-10-CM

## 2023-12-09 DIAGNOSIS — Z9889 Other specified postprocedural states: Secondary | ICD-10-CM

## 2023-12-17 NOTE — Telephone Encounter (Signed)
 Left message to call back

## 2023-12-17 NOTE — Progress Notes (Deleted)
 Cardiology Office Note    Date:  12/17/2023  ID:  Minela, [place] 1946/01/12, MRN [id number] PCP:  Sherlynn Madden, MD  Cardiologist:  Jerel Balding, MD  Electrophysiologist:  None   Chief Complaint: ***  History of Present Illness: .    Hailey [name] is a 78 y.o. female with visit-pertinent history of longstanding persistent atrial fibrillation s/p RF ablation procedures, now with longstanding persistent atrial fibrillation, s/p dual-chamber permanent pacemaker ([employer] Accent 2012 for tachycardia-bradycardia syndrome and 2:1 atrioventricular block, generator change and new right ventricular lead for high pacing thresholds in March 2021), chronic diastolic heart failure (hx of acute decompensation on Multaq ), multifactorial pulmonary artery hypertension, CAD (nonobstructive by cath 07/2017), type 2 diabetes mellitus, hypertension, s/p right hemithyroidectomy for large goiter hyperthyroidism.   Echocardiogram in July 2020 showed normal LV systolic function and EF of 60 to 65%, pseudo normal mitral inflow, moderately enlarged right ventricle, biatrial mild dilation, estimated systolic PA pressure 40 mmHg.  Previous right heart catheterization showed that her pulmonary hypertension could not be explained exclusively based on elevated left heart filling pressures.  Repeat echo in 06/2022 showed evidence of dilation weakening of the right ventricle and right heart catheterization showed PA pressure 73/27 (mean 41), wedge pressure 23, transpulmonary gradient 18, PVR 5.6, cardiac index 2.1, PAP 3.8, mixed venous saturation 56-58%.   She was last seen in clinic on 05/12/2023 by Dr. Balding.  She had overall been stable.  She denied any lower extremity edema, frank orthopnea or PND, noted that she does routinely sleep elevated on a wedge.  She denies any dyspnea with routine household activities and denies chest pain.  Her SGLT2 inhibitor was discontinued previously due to recurrent genital yeast  infections.  It was noted that she had not been using her CPAP as she was attending to her husband's medical problems in the setting of progressive dementia.  She is followed by Dr. Shelah with pulmonology.  On 11/02/2023 patient notified the office that she was having increased lower extremity edema and chest tightness.  On chart review appears that she presented to [facility].  She was admitted on 11/02/2023 and discharged the following day, she presented to the ED with complaints of dyspnea, subdiaphragmatic pain.  She reported that symptoms have been ongoing for 2 and half weeks, reported pain under her diaphragm bilaterally that was worse with coughing or taking a deep breath.  Reported nonproductive cough for the past 6 months however in the past several weeks have been feeling increasingly dyspneic with mild exertion of less than 20 feet and with worsened orthopnea.  Patient reported feeling as though she was fluid overloaded however weight had decreased over the past several month from 114 pounds to 106 pounds.  CT showed pulmonary edema, hs Trop 15, Pro BNP elevated to 4,512, patient treated with IV Lasix .  Echocardiogram indicated LVEF of greater than 55%, LV with normal size and wall thickness, mild aortic regurgitation, left atrium was mildly dilated, RV was mildly rightly dilated in size with normal systolic function, there is moderate tricuspid regurgitation, moderate pulmonary hypertension.   Patient was seen in clinic on 11/13/2023.  She reported that she been feeling better since her recent hospitalization and that her breathing had improved, noted that her biggest complaint was fatigue and decreased activity tolerance.  Patient reported that she was no longer doing chores that she believes is able to do as a result of increased fatigue, noted that she just did not feel  like doing activities.  Patient reported that she continued to have a cough after eating, reported with a small amount of food  she immediately felt full.  She been following with gastrology for ongoing GI issues, had previously been questioned if she had gastroparesis but she did not want to undergo further evaluation.  Patient denied any lower extremity edema, reported that her breathing would worsen with fluid accumulation however had improved since she was discharged from the hospital.  She been taking Lasix  40 mg twice daily.  She reported she had not been wearing her CPAP in recent months as she was caring for her husband overnight who had dementia and Parkinson's.  Per patient she had been taking methimazole  as well as Synthroid up until her recent hospitalization, noted to have a significantly low TSH.  Recommended that she follow-up with her endocrinologist.  On lab work completed that day patient's BNP was elevated and her Lasix  was increased to 80 mg in the morning and 40 mg in the afternoon.  Follow-up lab work indicated electrolyte abnormalities consistent with overdiuresis, patient instructed to decrease Lasix  however has been unable to reach by phone.  Today she presents for follow-up.  She reports that she   CHF: Echocardiogram on 11/02/23 indicated LVEF of greater than 55%, LV with normal size and wall thickness, mild aortic regurgitation, left atrium was mildly dilated, RV was mildly rightly dilated in size with normal systolic function, there is moderate tricuspid regurgitation, moderate pulmonary hypertension.  SGLT2 inhibitors previously discontinued due to frequent yeast infections and muscle cramps.   Patient was hospitalized in early June in setting of fluid overload, BNP 4512, treated with IV Lasix .  Patient reported improvement in her breathing following discharge however continued to note increased fatigue, question if likely related to her thyroid  as at that time she reported she had continued taking methimazole  and Synthroid. Lab work completed office visit indicated elevations in her BNP, Lasix  was  increased to 80 mg in the morning and 40 mg in the afternoon however follow-up lab work indicated electrolyte Continue Lasix  40 mg twice daily, encouraged patient to take second dose of Lasix  in the afternoon she notes she is frequently getting up overnight.   Continue metoprolol  tartrate 12.5 mg twice daily and spironolactone  25 mg daily.    PAH/OSA: Patient with history of PAH, felt to be precapillary, exact mechanism unclear.  She does have a history of a markedly positive ANA without other findings to suggest systemic autoimmune disease.  Patient does have a history of OSA.  She is empirically on sildenafil .  At last office visit she reported she had not been using her CPAP device as she was caring for her husband who has dementia and Parkinson's overnight and was unable to wear her CPAP and care for him, she is following up with pulmonology tomorrow.   Patient reported improvement in her breathing following hospital discharge, lab work completed indicated elevations in BNP and her Lasix  was increased. Today she reports    Permanent afib/SSS/PPM: Interrogation on 11/07/2023 showed presenting rhythm as atrial fibrillation, V paced.  Patient not pacemaker dependent 100% V paced and does not tolerate loss of V pacing.  Lead measurements were stable.   Patient denies any palpitations or bleeding problems on Eliquis .   CAD: nonobstructive by cath 07/2017.  She denies any chest pain.   Goiter: S/p hemithyroidectomy.  Patient notes that she has further nodules, she is to follow-up with endocrinology.  Reports that she had continued  taking methimazole  with Synthroid up until recent hospitalization.   On review of [facility]inology notes from [facility] Synthroid has been discontinued and she has been restarted on methimazole .   Cough: Patient notes frequent cough, more often following eating, not associated with shortness of breath.  Question if related to GERD or nodules that she reports having in her  throat.  Patient has been referred to ENT for further evaluation.   Early satiety: Patient has noted that after eating small amounts of food she feels significantly full, she has previously seen GI for similar issues.  On review of notes was questioned if she has gastroparesis, patient notes of persistent she will follow-up with GI.     Labwork independently reviewed:   ROS: .   *** denies chest pain, shortness of breath, lower extremity edema, fatigue, palpitations, melena, hematuria, hemoptysis, diaphoresis, weakness, presyncope, syncope, orthopnea, and PND.  All other systems are reviewed and otherwise negative.  Studies Reviewed: SABRA    EKG:  EKG is ordered today, personally reviewed, demonstrating ***     CV Studies: Cardiac studies reviewed are outlined and summarized above. Otherwise please see EMR for full report. Cardiac Studies & Procedures   ______________________________________________________________________________________________ CARDIAC CATHETERIZATION  CARDIAC CATHETERIZATION 07/09/2022  Conclusion Findings:  RA = 12 RV = 76/11 PA = 73/27 (41) PCW = 23 Fick cardiac output/index = 3.2/2.1 PVR = 5.6 WU Ao sat = 96% PA sat = 56%, 58% PAPi = 3.8  Assessment: 1. Mixed moderate pulmonary venous and pulmonary arterial HTN with elevated filling pressures and low cardiac output  Plan/Discussion:  Options limited. Not candidate for selective pulmonary artery vasodilators. Would push diuretics as tolerated. Ensure oxygenation > 88% at all times. Consider pulmonary rehab.  Toribio Fuel, MD 9:43 AM   CARDIAC CATHETERIZATION  CARDIAC CATHETERIZATION 04/22/2018  Conclusion 1.  Mildly elevated left-sided filling pressures. 2.  Moderate to severe pulmonary hypertension with normal cardiac output.  Pulmonary capillary wedge pressure: 19 mmHg. PA pressure: 68/28 with a mean of 43 mmHg. Pulmonary vascular resistance: 4.9 Woods units Cardiac index was  2.97.  Recommendations: The patient seems to have mixed pulmonary hypertension due to left-sided heart failure and primary pulmonary hypertension.  She is mildly volume overloaded and I suspect that she can switch to oral furosemide  tomorrow. Consider vasodilator therapy for pulmonary hypertension.   STRESS TESTS  MYOCARDIAL PERFUSION IMAGING 05/30/2017  Interpretation Summary  The left ventricular ejection fraction is hyperdynamic (>65%).  Nuclear stress EF: 69%.  There was no ST segment deviation noted during stress.  Defect 1: There is a small defect of mild severity present in the mid anterior and apical anterior location.  Findings consistent with ischemia.  This is a low risk study.  There is a mild severity, small area reversible defect in the mid and apical anterior walls consistent with mild ischemia (SDS = 2).   ECHOCARDIOGRAM  ECHOCARDIOGRAM COMPLETE 07/01/2022  Narrative ECHOCARDIOGRAM REPORT    Patient Name:   Hailey [name] Date of Exam: 07/01/2022 Medical Rec #:  [id number]      Height:       59.0 in Accession #:    [id number]     Weight:       131.0 lb Date of Birth:  [birth year]      BSA:          1.541 m Patient Age:    76 years       BP:  120/74 mmHg Patient Gender: F              HR:           60 bpm. Exam Location:  [address]  Procedure: 2D Echo, Color Doppler, Cardiac Doppler, 3D Echo and Intracardiac Opacification Agent  Indications:    Dyspnea R06.00  History:        Patient has prior history of Echocardiogram examinations, most recent 12/16/2018. CHF, CAD, Pacemaker, Arrythmias:Atrial Fibrillation; Risk Factors:Hypertension and Diabetes.  Sonographer:    Augustin Seals RDCS Referring Phys: [phone] MIHAI CROITORU  IMPRESSIONS   1. Evidence for moderate pulmonary HTN Estimated PAs pressure 62 mmHg. 2. Left ventricular ejection fraction, by estimation, is 60 to 65%. The left ventricle has normal function. The left ventricle has  no regional wall motion abnormalities. Left ventricular diastolic parameters were normal. 3. Catheter in RA/RV . Right ventricular systolic function is moderately reduced. The right ventricular size is normal. There is moderately elevated pulmonary artery systolic pressure. 4. Left atrial size was mildly dilated. 5. Right atrial size was moderately dilated. 6. The mitral valve is abnormal. No evidence of mitral valve regurgitation. No evidence of mitral stenosis. 7. Tricuspid valve regurgitation is severe. 8. The aortic valve is tricuspid. Aortic valve regurgitation is trivial. No aortic stenosis is present. 9. The inferior vena cava is dilated in size with <50% respiratory variability, suggesting right atrial pressure of 15 mmHg.  FINDINGS Left Ventricle: Left ventricular ejection fraction, by estimation, is 60 to 65%. The left ventricle has normal function. The left ventricle has no regional wall motion abnormalities. Definity  contrast agent was given IV to delineate the left ventricular endocardial borders. The left ventricular internal cavity size was normal in size. There is no left ventricular hypertrophy. Left ventricular diastolic parameters were normal.  Right Ventricle: Catheter in RA/RV. The right ventricular size is normal. No increase in right ventricular wall thickness. Right ventricular systolic function is moderately reduced. There is moderately elevated pulmonary artery systolic pressure. The tricuspid regurgitant velocity is 3.45 m/s, and with an assumed right atrial pressure of 8 mmHg, the estimated right ventricular systolic pressure is 55.6 mmHg.  Left Atrium: Left atrial size was mildly dilated.  Right Atrium: Right atrial size was moderately dilated.  Pericardium: There is no evidence of pericardial effusion.  Mitral Valve: The mitral valve is abnormal. There is mild thickening of the mitral valve leaflet(s). Mild mitral annular calcification. No evidence of mitral valve  regurgitation. No evidence of mitral valve stenosis.  Tricuspid Valve: The tricuspid valve is normal in structure. Tricuspid valve regurgitation is severe. No evidence of tricuspid stenosis.  Aortic Valve: The aortic valve is tricuspid. Aortic valve regurgitation is trivial. No aortic stenosis is present.  Pulmonic Valve: The pulmonic valve was normal in structure. Pulmonic valve regurgitation is trivial. No evidence of pulmonic stenosis.  Aorta: The aortic root is normal in size and structure.  Venous: The inferior vena cava is dilated in size with less than 50% respiratory variability, suggesting right atrial pressure of 15 mmHg.  IAS/Shunts: No atrial level shunt detected by color flow Doppler.  Additional Comments: Evidence for moderate pulmonary HTN Estimated PAs pressure 62 mmHg.   LEFT VENTRICLE PLAX 2D LVIDd:         4.00 cm   Diastology LVIDs:         2.90 cm   LV e' medial:    6.40 cm/s LV PW:         0.90 cm  LV E/e' medial:  14.9 LV IVS:        0.90 cm   LV e' lateral:   7.80 cm/s LVOT diam:     1.90 cm   LV E/e' lateral: 12.3 LV SV:         42 LV SV Index:   27 LVOT Area:     2.84 cm   RIGHT VENTRICLE RV Basal diam:  4.40 cm RV Mid diam:    4.30 cm RV S prime:     5.43 cm/s TAPSE (M-mode): 2.2 cm  LEFT ATRIUM             Index        RIGHT ATRIUM           Index LA diam:        4.20 cm 2.73 cm/m   RA Area:     25.00 cm LA Vol (A2C):   42.8 ml 27.78 ml/m  RA Volume:   82.50 ml  53.54 ml/m LA Vol (A4C):   63.6 ml 41.28 ml/m LA Biplane Vol: 54.5 ml 35.37 ml/m AORTIC VALVE LVOT Vmax:   68.70 cm/s LVOT Vmean:  44.000 cm/s LVOT VTI:    0.148 m  AORTA Ao Root diam: 2.60 cm Ao Asc diam:  2.80 cm  MITRAL VALVE               TRICUSPID VALVE MV Area (PHT): 4.49 cm    TR Peak grad:   47.6 mmHg MV Decel Time: 169 msec    TR Vmax:        345.00 cm/s MV E velocity: 95.60 cm/s MV A velocity: 32.60 cm/s  SHUNTS MV E/A ratio:  2.93        Systemic VTI:  0.15  m Systemic Diam: 1.90 cm  Maude Emmer MD Electronically signed by Maude Emmer MD Signature Date/Time: 07/01/2022/2:01:23 PM    Final          ______________________________________________________________________________________________       Current Reported Medications:.    No outpatient medications have been marked as taking for the 12/18/23 encounter (Appointment) with Taveon Enyeart D, NP.    Physical Exam:    VS:  There were no vitals taken for this visit.   Wt Readings from Last 3 Encounters:  11/13/23 101 lb (45.8 kg)  11/07/23 102 lb (46.3 kg)  08/26/23 114 lb 3.2 oz (51.8 kg)    GEN: Well nourished, well developed in no acute distress NECK: No JVD; No carotid bruits CARDIAC: ***RRR, no murmurs, rubs, gallops RESPIRATORY:  Clear to auscultation without rales, wheezing or rhonchi  ABDOMEN: Soft, non-tender, non-distended EXTREMITIES:  No edema; No acute deformity     Asessement and Plan:.     ***     Disposition: F/u with ***  Signed, Camani Sesay D Abir Eroh, NP

## 2023-12-17 NOTE — Telephone Encounter (Signed)
-----   Message from Katlyn D West sent at 12/10/2023  1:31 PM EDT ----- Please let Hailey [name] know that her kidney function had a slight decrease with increasing her diuretic, on review of prior lab work it appears to be overall stable. Her sodium level is down trending  as well as her chloride, this can occur in setting over diuresis. Her BNP which indicates possible overload of fluid remains elevated, however given electrolytes changes which indicate over diuresis  would recommend decreasing lasix  to 40 mg twice daily. Please notify the office of weight gain, worsening shortness of breath or lower extremity edema. Follow up as planned. ----- Message ----- From: Rebecka Memos Lab Results In Sent: 12/05/2023   3:35 AM EDT To: Katlyn D West, NP

## 2023-12-18 ENCOUNTER — Telehealth: Payer: Self-pay | Admitting: Cardiovascular Disease

## 2023-12-18 ENCOUNTER — Ambulatory Visit: Admitting: Cardiology

## 2023-12-18 ENCOUNTER — Other Ambulatory Visit: Payer: Self-pay | Admitting: Sports Medicine

## 2023-12-18 DIAGNOSIS — E871 Hypo-osmolality and hyponatremia: Secondary | ICD-10-CM

## 2023-12-18 DIAGNOSIS — E049 Nontoxic goiter, unspecified: Secondary | ICD-10-CM

## 2023-12-18 DIAGNOSIS — I2510 Atherosclerotic heart disease of native coronary artery without angina pectoris: Secondary | ICD-10-CM

## 2023-12-18 DIAGNOSIS — I4821 Permanent atrial fibrillation: Secondary | ICD-10-CM

## 2023-12-18 DIAGNOSIS — I5032 Chronic diastolic (congestive) heart failure: Secondary | ICD-10-CM

## 2023-12-18 DIAGNOSIS — R112 Nausea with vomiting, unspecified: Secondary | ICD-10-CM

## 2023-12-18 DIAGNOSIS — I2721 Secondary pulmonary arterial hypertension: Secondary | ICD-10-CM

## 2023-12-18 DIAGNOSIS — I495 Sick sinus syndrome: Secondary | ICD-10-CM

## 2023-12-18 DIAGNOSIS — G4733 Obstructive sleep apnea (adult) (pediatric): Secondary | ICD-10-CM

## 2023-12-18 DIAGNOSIS — Z950 Presence of cardiac pacemaker: Secondary | ICD-10-CM

## 2023-12-18 MED ORDER — FUROSEMIDE 40 MG PO TABS: 40.0000 mg | ORAL_TABLET | Freq: Two times a day (BID) | ORAL | Status: DC

## 2023-12-18 NOTE — Telephone Encounter (Signed)
 Daughter Francie) wants a call back or a message sent to MyChart to explain patient's Pro B Natriuretic levels.

## 2023-12-18 NOTE — Addendum Note (Signed)
 Addended by: BYRON LEONTINE RAMAN on: 12/18/2023 04:58 PM   Modules accepted: Orders

## 2023-12-18 NOTE — Telephone Encounter (Signed)
 Spoke to [name] (daughter on [place]) and advised information from telephone encounter on 11/14/23. Pt was supposed to have an appointment on today, but canceled due to feeling sick to her stomach with nausea and dry heaving. Pt's daughter reports patient has only been able to keep down apple sauce and toast with minimal amounts of water. Also noted that patient has been having pains in her head with left sided eye swelling. Advised patient of lab results and verified that patient has been on the use of Lasix  80 mg in the morning and 40 mg at night. Pt's daughter currently declining pt having edema. Spoke with Katlyn West, NP and [name] has been advised to decrease Lasix  to 40 mg twice daily and to not give Lasix  if patient is not able to tolerate fluid intake. Also, advised if symptoms continue to worsen then patient needs to be evaluated in the emergency room. Daughter agrees with plan. As for the head pain and eye swelling, advised to contact PCP per Katlyn, NP. BMET to be updated in 1 week and [name] agrees with plan of care.

## 2023-12-18 NOTE — Telephone Encounter (Signed)
 Called patient and spoke to her daughter [name], on [place], patient was present for call.  They report that she has been able to eat more this afternoon and has been able to drink some fluids.  They are unsure if this is related to her prior GI problems as daughter notes that she does have problems with intermittent nausea.  Discussed recommendation for ED evaluation given recent electrolyte abnormalities noted on prior lab work and current symptoms, they deferred at this time.  They note that if she worsens they will present to the emergency room, if she remains in her current state they will take her in the morning.  They feel that she is overall stable.  They report she has not taken her afternoon dose of Lasix  yet, instructed to hold her Lasix  while she is having limited intake.  They note she is supposed to have lab work with her endocrinologist tomorrow, will order BMET for tomorrow. Reviewed ED precautions.

## 2023-12-19 ENCOUNTER — Ambulatory Visit: Admitting: Emergency Medicine

## 2023-12-19 DIAGNOSIS — E0520 Thyrotoxicosis with toxic multinodular goiter without thyrotoxic crisis or storm: Secondary | ICD-10-CM | POA: Diagnosis not present

## 2023-12-19 DIAGNOSIS — E042 Nontoxic multinodular goiter: Secondary | ICD-10-CM | POA: Diagnosis not present

## 2023-12-19 LAB — BASIC METABOLIC PANEL WITH GFR: EGFR: 56

## 2023-12-24 ENCOUNTER — Ambulatory Visit: Payer: Self-pay | Admitting: Cardiology

## 2023-12-25 NOTE — Telephone Encounter (Signed)
-----   Message from Katlyn D West sent at 12/24/2023  8:22 AM EDT ----- Please let Hailey [name] know that her kidney function and electrolytes are improving, good results. Continue Lasix  40 mg twice daily and follow up as planned.  ----- Message ----- From: Renaye Rollo RAMAN Sent: 12/22/2023   8:57 AM EDT To: Katlyn D West, NP

## 2023-12-25 NOTE — Telephone Encounter (Signed)
 Called patient advised of below they verbalized understanding.

## 2023-12-29 ENCOUNTER — Encounter (HOSPITAL_COMMUNITY): Payer: Self-pay

## 2023-12-29 ENCOUNTER — Ambulatory Visit (HOSPITAL_COMMUNITY)

## 2023-12-29 ENCOUNTER — Other Ambulatory Visit: Payer: Self-pay | Admitting: Cardiovascular Disease

## 2023-12-29 NOTE — Progress Notes (Signed)
 Remote pacemaker transmission.

## 2023-12-30 ENCOUNTER — Ambulatory Visit: Admitting: Sports Medicine

## 2023-12-31 NOTE — Telephone Encounter (Signed)
 Prescription refill request for Eliquis  received. Indication:afib Last office visit:6/25 Scr:1.02  7/25 Age: 78 Weight:45.8  kg  Prescription refilled

## 2024-01-02 ENCOUNTER — Encounter: Payer: Self-pay | Admitting: Cardiology

## 2024-01-02 ENCOUNTER — Ambulatory Visit: Attending: Cardiology | Admitting: Cardiology

## 2024-01-02 VITALS — BP 122/60 | HR 73 | Ht 59.0 in | Wt 100.0 lb

## 2024-01-02 DIAGNOSIS — E049 Nontoxic goiter, unspecified: Secondary | ICD-10-CM | POA: Diagnosis present

## 2024-01-02 DIAGNOSIS — D6869 Other thrombophilia: Secondary | ICD-10-CM | POA: Diagnosis present

## 2024-01-02 DIAGNOSIS — I495 Sick sinus syndrome: Secondary | ICD-10-CM | POA: Diagnosis not present

## 2024-01-02 DIAGNOSIS — I2721 Secondary pulmonary arterial hypertension: Secondary | ICD-10-CM | POA: Insufficient documentation

## 2024-01-02 DIAGNOSIS — I480 Paroxysmal atrial fibrillation: Secondary | ICD-10-CM | POA: Insufficient documentation

## 2024-01-02 DIAGNOSIS — Z79899 Other long term (current) drug therapy: Secondary | ICD-10-CM | POA: Diagnosis present

## 2024-01-02 DIAGNOSIS — I4821 Permanent atrial fibrillation: Secondary | ICD-10-CM | POA: Insufficient documentation

## 2024-01-02 DIAGNOSIS — Z950 Presence of cardiac pacemaker: Secondary | ICD-10-CM | POA: Diagnosis present

## 2024-01-02 DIAGNOSIS — I5032 Chronic diastolic (congestive) heart failure: Secondary | ICD-10-CM | POA: Diagnosis not present

## 2024-01-02 DIAGNOSIS — I2510 Atherosclerotic heart disease of native coronary artery without angina pectoris: Secondary | ICD-10-CM | POA: Insufficient documentation

## 2024-01-02 DIAGNOSIS — R053 Chronic cough: Secondary | ICD-10-CM | POA: Insufficient documentation

## 2024-01-02 NOTE — Progress Notes (Signed)
 Cardiology Office Note    Date:  01/03/2024  ID:  Chryl, Holten Nov 11, 1945, MRN [id number] PCP:  Sherlynn Madden, MD  Cardiologist:  Jerel Balding, MD  Electrophysiologist:  None   Chief Complaint: Follow up for pulmonary HTN   History of Present Illness: .    HARLAN VINAL is a 78 y.o. female with visit-pertinent history of longstanding persistent atrial fibrillation s/p RF ablation procedures, now with longstanding persistent atrial fibrillation, s/p dual-chamber permanent pacemaker ([employer] Accent 2012 for tachycardia-bradycardia syndrome and 2:1 atrioventricular block, generator change and new right ventricular lead for high pacing thresholds in March 2021), chronic diastolic heart failure (hx of acute decompensation on Multaq ), multifactorial pulmonary artery hypertension, CAD (nonobstructive by cath 07/2017), type 2 diabetes mellitus, hypertension, s/p right hemithyroidectomy for large goiter hyperthyroidism.   Echocardiogram in July 2020 showed normal LV systolic function and EF of 60 to 65%, pseudo normal mitral inflow, moderately enlarged right ventricle, biatrial mild dilation, estimated systolic PA pressure 40 mmHg.  Previous right heart catheterization showed that her pulmonary hypertension could not be explained exclusively based on elevated left heart filling pressures.  Repeat echo in 06/2022 showed evidence of dilation weakening of the right ventricle and right heart catheterization showed PA pressure 73/27 (mean 41), wedge pressure 23, transpulmonary gradient 18, PVR 5.6, cardiac index 2.1, PAP 3.8, mixed venous saturation 56-58%.   She was seen in clinic on 05/12/2023 by Dr. Balding.  She had overall been stable.  She denied any lower extremity edema, frank orthopnea or PND, noted that she does routinely sleep elevated on a wedge.  She denies any dyspnea with routine household activities and denies chest pain.  Her SGLT2 inhibitor was discontinued previously due to  recurrent genital yeast infections.  It was noted that she had not been using her CPAP as she was attending to her husband's medical problems in the setting of progressive dementia.  She is followed by Dr. Shelah with pulmonology.  On 11/02/2023 patient notified the office that she was having increased lower extremity edema and chest tightness.  On chart review appears that she presented to [facility].  She was admitted on 11/02/2023 and discharged the following day, she presented to the ED with complaints of dyspnea, subdiaphragmatic pain.  She reported that symptoms have been ongoing for 2 and half weeks, reported pain under her diaphragm bilaterally that was worse with coughing or taking a deep breath.  Reported nonproductive cough for the past 6 months however in the past several weeks have been feeling increasingly dyspneic with mild exertion of less than 20 feet and with worsened orthopnea.  Patient reported feeling as though she was fluid overloaded however weight had decreased over the past several month from 114 pounds to 106 pounds.  CT showed pulmonary edema, hs Trop 15, Pro BNP elevated to 4,512, patient treated with IV Lasix .  Echocardiogram indicated LVEF of greater than 55%, LV with normal size and wall thickness, mild aortic regurgitation, left atrium was mildly dilated, RV was mildly rightly dilated in size with normal systolic function, there is moderate tricuspid regurgitation, moderate pulmonary hypertension.   Patient was seen in clinic on 11/13/2023.  She reported that she been feeling better since her recent hospitalization and that her breathing had improved, noted that her biggest complaint was fatigue and decreased activity tolerance.  Patient reported that she was no longer doing chores that she believes is able to do as a result of increased fatigue, noted that she  just did not feel like doing activities.  Patient reported that she continued to have a cough after eating, reported with a  small amount of food she immediately felt full.  She been following with gastrology for ongoing GI issues, had previously been questioned if she had gastroparesis but she did not want to undergo further evaluation.  Patient denied any lower extremity edema, reported that her breathing would worsen with fluid accumulation however had improved since she was discharged from the hospital.  She been taking Lasix  40 mg twice daily.  She reported she had not been wearing her CPAP in recent months as she was caring for her husband overnight who had dementia and Parkinson's.  Per patient she had been taking methimazole  as well as Synthroid up until her recent hospitalization, noted to have a significantly low TSH.  Recommended that she follow-up with her endocrinologist.  On lab work completed that day patient's BNP was elevated and her Lasix  was increased to 80 mg in the morning and 40 mg in the afternoon.  Follow-up lab work indicated electrolyte abnormalities consistent with overdiuresis, patient instructed to decrease Lasix , follow up lab work showed improvement in electrolytes and she was continued on Lasix  40 mg twice daily.   Today she presents for follow-up with her daughter.  She reports that she has been doing very well overall.  She reports that her shortness of breath continues to improve, reports that she feels she is getting closer to her baseline.  She denies any lower extremity edema, orthopnea or PND.  Patient notes that when she typically holds fluid she will do in her lungs and in her abdomen, denies any abdominal swelling.  Patient reports that her GI illness has resolved.  She did see her endocrinologist who discontinued use of her Synthroid, she is now only on methimazole .  Patient and daughter deny any cardiac concerns or complaints today.  She will continue on Lasix  40 mg twice daily. ROS: .   Today she denies chest pain, shortness of breath, lower extremity edema, fatigue, palpitations, melena,  hematuria, hemoptysis, diaphoresis, weakness, presyncope, syncope, orthopnea, and PND.  All other systems are reviewed and otherwise negative. Studies Reviewed: SABRA   EKG:  EKG is ordered today, personally reviewed, demonstrating  EKG Interpretation Date/Time:  Friday January 02 2024 14:47:57 EDT Ventricular Rate:  59 PR Interval:    QRS Duration:  124 QT Interval:  468 QTC Calculation: 463 R Axis:   -76  Text Interpretation: Ventricular-paced rhythm When compared with ECG of 13-Nov-2023 08:47, Vent. rate has decreased BY   2 BPM Confirmed by Roman Sandall [phone]) on 01/02/2024 6:32:22 PM   CV Studies: Cardiac studies reviewed are outlined and summarized above. Otherwise please see EMR for full report. Cardiac Studies & Procedures   ______________________________________________________________________________________________ CARDIAC CATHETERIZATION  CARDIAC CATHETERIZATION 07/09/2022  Conclusion Findings:  RA = 12 RV = 76/11 PA = 73/27 (41) PCW = 23 Fick cardiac output/index = 3.2/2.1 PVR = 5.6 WU Ao sat = 96% PA sat = 56%, 58% PAPi = 3.8  Assessment: 1. Mixed moderate pulmonary venous and pulmonary arterial HTN with elevated filling pressures and low cardiac output  Plan/Discussion:  Options limited. Not candidate for selective pulmonary artery vasodilators. Would push diuretics as tolerated. Ensure oxygenation > 88% at all times. Consider pulmonary rehab.  Toribio Fuel, MD 9:43 AM   CARDIAC CATHETERIZATION  CARDIAC CATHETERIZATION 04/22/2018  Conclusion 1.  Mildly elevated left-sided filling pressures. 2.  Moderate to severe pulmonary hypertension with  normal cardiac output.  Pulmonary capillary wedge pressure: 19 mmHg. PA pressure: 68/28 with a mean of 43 mmHg. Pulmonary vascular resistance: 4.9 Woods units Cardiac index was 2.97.  Recommendations: The patient seems to have mixed pulmonary hypertension due to left-sided heart failure and primary pulmonary  hypertension.  She is mildly volume overloaded and I suspect that she can switch to oral furosemide  tomorrow. Consider vasodilator therapy for pulmonary hypertension.   STRESS TESTS  MYOCARDIAL PERFUSION IMAGING 05/30/2017  Interpretation Summary  The left ventricular ejection fraction is hyperdynamic (>65%).  Nuclear stress EF: 69%.  There was no ST segment deviation noted during stress.  Defect 1: There is a small defect of mild severity present in the mid anterior and apical anterior location.  Findings consistent with ischemia.  This is a low risk study.  There is a mild severity, small area reversible defect in the mid and apical anterior walls consistent with mild ischemia (SDS = 2).   ECHOCARDIOGRAM  ECHOCARDIOGRAM COMPLETE 07/01/2022  Narrative ECHOCARDIOGRAM REPORT    Patient Name:   SHANEDRA LAVE Date of Exam: 07/01/2022 Medical Rec #:  [id number]      Height:       59.0 in Accession #:    [id number]     Weight:       131.0 lb Date of Birth:  January 30, 1946      BSA:          1.541 m Patient Age:    76 years       BP:           120/74 mmHg Patient Gender: F              HR:           60 bpm. Exam Location:  [address]  Procedure: 2D Echo, Color Doppler, Cardiac Doppler, 3D Echo and Intracardiac Opacification Agent  Indications:    Dyspnea R06.00  History:        Patient has prior history of Echocardiogram examinations, most recent 12/16/2018. CHF, CAD, Pacemaker, Arrythmias:Atrial Fibrillation; Risk Factors:Hypertension and Diabetes.  Sonographer:    Augustin Seals RDCS Referring Phys: [phone] MIHAI CROITORU  IMPRESSIONS   1. Evidence for moderate pulmonary HTN Estimated PAs pressure 62 mmHg. 2. Left ventricular ejection fraction, by estimation, is 60 to 65%. The left ventricle has normal function. The left ventricle has no regional wall motion abnormalities. Left ventricular diastolic parameters were normal. 3. Catheter in RA/RV . Right ventricular  systolic function is moderately reduced. The right ventricular size is normal. There is moderately elevated pulmonary artery systolic pressure. 4. Left atrial size was mildly dilated. 5. Right atrial size was moderately dilated. 6. The mitral valve is abnormal. No evidence of mitral valve regurgitation. No evidence of mitral stenosis. 7. Tricuspid valve regurgitation is severe. 8. The aortic valve is tricuspid. Aortic valve regurgitation is trivial. No aortic stenosis is present. 9. The inferior vena cava is dilated in size with <50% respiratory variability, suggesting right atrial pressure of 15 mmHg.  FINDINGS Left Ventricle: Left ventricular ejection fraction, by estimation, is 60 to 65%. The left ventricle has normal function. The left ventricle has no regional wall motion abnormalities. Definity  contrast agent was given IV to delineate the left ventricular endocardial borders. The left ventricular internal cavity size was normal in size. There is no left ventricular hypertrophy. Left ventricular diastolic parameters were normal.  Right Ventricle: Catheter in RA/RV. The right ventricular size is normal. No increase in right ventricular wall  thickness. Right ventricular systolic function is moderately reduced. There is moderately elevated pulmonary artery systolic pressure. The tricuspid regurgitant velocity is 3.45 m/s, and with an assumed right atrial pressure of 8 mmHg, the estimated right ventricular systolic pressure is 55.6 mmHg.  Left Atrium: Left atrial size was mildly dilated.  Right Atrium: Right atrial size was moderately dilated.  Pericardium: There is no evidence of pericardial effusion.  Mitral Valve: The mitral valve is abnormal. There is mild thickening of the mitral valve leaflet(s). Mild mitral annular calcification. No evidence of mitral valve regurgitation. No evidence of mitral valve stenosis.  Tricuspid Valve: The tricuspid valve is normal in structure. Tricuspid valve  regurgitation is severe. No evidence of tricuspid stenosis.  Aortic Valve: The aortic valve is tricuspid. Aortic valve regurgitation is trivial. No aortic stenosis is present.  Pulmonic Valve: The pulmonic valve was normal in structure. Pulmonic valve regurgitation is trivial. No evidence of pulmonic stenosis.  Aorta: The aortic root is normal in size and structure.  Venous: The inferior vena cava is dilated in size with less than 50% respiratory variability, suggesting right atrial pressure of 15 mmHg.  IAS/Shunts: No atrial level shunt detected by color flow Doppler.  Additional Comments: Evidence for moderate pulmonary HTN Estimated PAs pressure 62 mmHg.   LEFT VENTRICLE PLAX 2D LVIDd:         4.00 cm   Diastology LVIDs:         2.90 cm   LV e' medial:    6.40 cm/s LV PW:         0.90 cm   LV E/e' medial:  14.9 LV IVS:        0.90 cm   LV e' lateral:   7.80 cm/s LVOT diam:     1.90 cm   LV E/e' lateral: 12.3 LV SV:         42 LV SV Index:   27 LVOT Area:     2.84 cm   RIGHT VENTRICLE RV Basal diam:  4.40 cm RV Mid diam:    4.30 cm RV S prime:     5.43 cm/s TAPSE (M-mode): 2.2 cm  LEFT ATRIUM             Index        RIGHT ATRIUM           Index LA diam:        4.20 cm 2.73 cm/m   RA Area:     25.00 cm LA Vol (A2C):   42.8 ml 27.78 ml/m  RA Volume:   82.50 ml  53.54 ml/m LA Vol (A4C):   63.6 ml 41.28 ml/m LA Biplane Vol: 54.5 ml 35.37 ml/m AORTIC VALVE LVOT Vmax:   68.70 cm/s LVOT Vmean:  44.000 cm/s LVOT VTI:    0.148 m  AORTA Ao Root diam: 2.60 cm Ao Asc diam:  2.80 cm  MITRAL VALVE               TRICUSPID VALVE MV Area (PHT): 4.49 cm    TR Peak grad:   47.6 mmHg MV Decel Time: 169 msec    TR Vmax:        345.00 cm/s MV E velocity: 95.60 cm/s MV A velocity: 32.60 cm/s  SHUNTS MV E/A ratio:  2.93        Systemic VTI:  0.15 m Systemic Diam: 1.90 cm  Maude Emmer MD Electronically signed by Maude Emmer MD Signature Date/Time: 07/01/2022/2:01:23  PM  Final          ______________________________________________________________________________________________       Current Reported Medications:.    Current Meds  Medication Sig   acetaminophen  (TYLENOL ) 500 MG tablet Take 500-1,000 mg by mouth every 6 (six) hours as needed (pain.).   Cholecalciferol  (VITAMIN D3) 1000 UNITS CAPS Take 1,000 Units by mouth in the morning.   ELIQUIS  5 MG TABS tablet TAKE ONE TABLET BY MOUTH TWICE DAILY   furosemide  (LASIX ) 40 MG tablet Take 40 mg by mouth 2 (two) times daily.   glucose blood (ONETOUCH ULTRA) test strip USE DAILY TO CHECK BLOOD SUGAR E11.8   Lancets Misc. (ONE TOUCH SURESOFT) MISC Use to test  Blood sugar once daily DX: E11.8,   loperamide  (IMODIUM  A-D) 2 MG tablet Take 2 mg by mouth.   magnesium  oxide (MAG-OX) 400 (240 Mg) MG tablet Take 400 mg by mouth daily.   methimazole  (TAPAZOLE ) 10 MG tablet Take 20 mg (two tablets) daily.   metoprolol  tartrate (LOPRESSOR ) 25 MG tablet Take 12.5 mg by mouth in the morning and at bedtime.   ondansetron  (ZOFRAN ) 4 MG tablet TAKE ONE TABLET BY MOUTH EVERY EIGHT HOURS AS NEEDED FOR NAUSEA OR VOMITING   oxymetazoline  (AFRIN NASAL SPRAY) 0.05 % nasal spray Place 1 spray into both nostrils 2 (two) times daily.   pantoprazole  (PROTONIX ) 40 MG tablet TAKE ONE TABLET BY MOUTH TWICE DAILY BEFORE A MEAL   potassium chloride  SA (KLOR-CON  M) 20 MEQ tablet TAKE ONE TABLET BY MOUTH TWICE DAILY   sildenafil  (REVATIO ) 20 MG tablet TAKE ONE TABLET BY MOUTH 3 TIMES DAILY   Sod Picosulfate-Mag Ox-Cit Acd (CLENPIQ ) 10-3.5-12 MG-GM -GM/175ML SOLN Take 1 kit by mouth as directed.   sodium chloride  (NASAL MOISTURIZING SPRAY) 0.65 % nasal spray Place 1 spray into the nose as needed for congestion.   spironolactone  (ALDACTONE ) 25 MG tablet TAKE ONE TABLET BY MOUTH EVERY DAY   tizanidine (ZANAFLEX) 2 MG capsule Take 2 mg by mouth at bedtime.   traMADol  (ULTRAM ) 50 MG tablet Take 50-100 mg by mouth every 8 (eight)  hours as needed.    Physical Exam:    VS:  BP 122/60   Pulse 73   Ht 4' 11 (1.499 m)   Wt 100 lb (45.4 kg)   SpO2 97%   BMI 20.20 kg/m    Wt Readings from Last 3 Encounters:  01/02/24 100 lb (45.4 kg)  11/13/23 101 lb (45.8 kg)  11/07/23 102 lb (46.3 kg)    GEN: Well nourished, well developed in no acute distress NECK: No JVD; No carotid bruits CARDIAC: RRR, no murmurs, rubs, gallops RESPIRATORY:  Clear to auscultation without rales, wheezing or rhonchi  ABDOMEN: Soft, non-tender, non-distended EXTREMITIES:  No edema; No acute deformity     Asessement and Plan:.    CHF: Echocardiogram on 11/02/23 indicated LVEF of greater than 55%, LV with normal size and wall thickness, mild aortic regurgitation, left atrium was mildly dilated, RV was mildly rightly dilated in size with normal systolic function, there is moderate tricuspid regurgitation, moderate pulmonary hypertension. SGLT2 inhibitors previously discontinued due to frequent yeast infections and muscle cramps.  Patient was hospitalized in early June in setting of fluid overload, BNP 4512, treated with IV Lasix .  Patient reported improvement in her breathing following discharge however continued to note increased fatigue, questioned if likely related to her thyroid  as at that time she reported she had continued taking methimazole  and Synthroid. Lab work completed office visit indicated  elevations in her BNP, Lasix  was increased to 80 mg in the morning and 40 mg in the afternoon however follow-up lab work indicated electrolyte abnormalities.  Today patient reports that she continues to improve, reports that her breathing will not quite at baseline has been consistently improving, her energy levels are also improving.  She denies any lower extremity edema, abdominal bloating, orthopnea or PND. Continue Lasix  40 mg twice daily, encouraged patient to take second dose of Lasix  in the afternoon she notes she is frequently getting up overnight.   Continue metoprolol  tartrate 12.5 mg twice daily and spironolactone  25 mg daily. Check BMET in two weeks.    PAH/OSA: Patient with history of PAH, felt to be precapillary, exact mechanism unclear.  She does have a history of a markedly positive ANA without other findings to suggest systemic autoimmune disease.  Patient does have a history of OSA.  She is empirically on sildenafil .  At last office visit she reported she had not been using her CPAP device as she was caring for her husband who has dementia and Parkinson's overnight and was unable to wear her CPAP and care for him, she is following up with pulmonology in September. Patient reported improvement in her breathing following hospital discharge, lab work completed indicated elevations in BNP and her Lasix  was increased, later decreased to prior dosing given electrolyte abnormalities.  Today she reports that her breathing continues to improve, tolerating Lasix  40 mg twice daily well, will continue at this time. Check BMET in two weeks.   Permanent afib/SSS/PPM: Interrogation on 11/07/2023 showed presenting rhythm as atrial fibrillation, V paced.  Patient not pacemaker dependent 100% V paced and does not tolerate loss of V pacing.  Lead measurements were stable.  Patient denies any palpitations or bleeding problems on Eliquis .   CAD: Nonobstructive by cath 07/2017.  She denies any chest pain.   Goiter: S/p hemithyroidectomy.  Patient notes that she has further nodules, she is to follow-up with endocrinology.  Reports that she had continued taking methimazole  with Synthroid up until recent hospitalization. On review of endocrinology notes from [facility] Synthroid has been discontinued and she has been restarted on methimazole .   Cough: Patient notes frequent cough, more often following eating, not associated with shortness of breath.  Question if related to GERD or nodules that she reports having in her throat.  Patient has been referred to ENT for  further evaluation.   Early satiety: Patient has noted that after eating small amounts of food she feels significantly full, she has previously seen GI for similar issues.  On review of notes was questioned if she has gastroparesis, patient notes of persistent she will follow-up with GI.   Disposition: F/u with Dr. Francyne in 3 months or sooner if needed.   Signed, Merriel Zinger D Legaci Tarman, NP

## 2024-01-02 NOTE — Patient Instructions (Signed)
 Medication Instructions:  Your physician recommends that you continue on your current medications as directed. Please refer to the Current Medication list given to you today.  *If you need a refill on your cardiac medications before your next appointment, please call your pharmacy*  Lab Work: BMET-in 1-2 weeks If you have labs (blood work) drawn today and your tests are completely normal, you will receive your results only by: MyChart Message (if you have MyChart) OR A paper copy in the mail If you have any lab test that is abnormal or we need to change your treatment, we will call you to review the results.  Follow-Up: At [facility], you and your health needs are our priority.  As part of our continuing mission to provide you with exceptional heart care, our providers are all part of one team.  This team includes your primary Cardiologist (physician) and Advanced Practice Providers or APPs (Physician Assistants and Nurse Practitioners) who all work together to provide you with the care you need, when you need it.  Your next appointment:   2-3 month(s)  Provider:   Jerel Balding, MD

## 2024-01-03 ENCOUNTER — Encounter: Payer: Self-pay | Admitting: Cardiology

## 2024-01-08 ENCOUNTER — Ambulatory Visit: Admitting: Cardiology

## 2024-01-09 ENCOUNTER — Ambulatory Visit: Admitting: Cardiology

## 2024-01-12 DIAGNOSIS — Z79899 Other long term (current) drug therapy: Secondary | ICD-10-CM | POA: Diagnosis not present

## 2024-01-12 DIAGNOSIS — I480 Paroxysmal atrial fibrillation: Secondary | ICD-10-CM | POA: Diagnosis not present

## 2024-01-13 ENCOUNTER — Ambulatory Visit: Payer: Self-pay | Admitting: Cardiology

## 2024-01-13 LAB — BASIC METABOLIC PANEL WITH GFR
BUN/Creatinine Ratio: 15 (ref 12–28)
BUN: 15 mg/dL (ref 8–27)
CO2: 25 mmol/L (ref 20–29)
Calcium: 9.2 mg/dL (ref 8.7–10.3)
Chloride: 94 mmol/L — ABNORMAL LOW (ref 96–106)
Creatinine, Ser: 0.99 mg/dL (ref 0.57–1.00)
Glucose: 105 mg/dL — ABNORMAL HIGH (ref 70–99)
Potassium: 4.7 mmol/L (ref 3.5–5.2)
Sodium: 134 mmol/L (ref 134–144)
eGFR: 58 mL/min/1.73 — ABNORMAL LOW (ref >59)

## 2024-01-23 ENCOUNTER — Other Ambulatory Visit: Payer: Self-pay | Admitting: Cardiovascular Disease

## 2024-01-27 ENCOUNTER — Ambulatory Visit: Admitting: Sports Medicine

## 2024-01-29 ENCOUNTER — Telehealth: Payer: Self-pay | Admitting: Cardiovascular Disease

## 2024-01-29 MED ORDER — FUROSEMIDE 40 MG PO TABS: 40.0000 mg | ORAL_TABLET | Freq: Two times a day (BID) | ORAL | 3 refills | Status: DC

## 2024-01-29 NOTE — Telephone Encounter (Signed)
*  STAT* If patient is at the pharmacy, call can be transferred to refill team.   1. Which medications need to be refilled? (please list name of each medication and dose if known)   furosemide  (LASIX ) 40 MG tablet   2. Would you like to learn more about the convenience, safety, & potential cost savings by using the [facility]?   3. Are you open to using the [facility] (Type [facility]. ).  4. Which pharmacy/location (including street and city if local pharmacy) is medication to be sent to?  [facility] - [place], [place] - [address]  5. Do they need a 30 day or 90 day supply?   90 day  Patient stated she is completely out of this medication.  Patient has appointment scheduled on 11/3 with Dr. Francyne.

## 2024-01-29 NOTE — Telephone Encounter (Signed)
 Pt's medication was sent to pt's pharmacy as requested. Confirmation received.

## 2024-02-04 ENCOUNTER — Ambulatory Visit (INDEPENDENT_AMBULATORY_CARE_PROVIDER_SITE_OTHER)

## 2024-02-04 DIAGNOSIS — I480 Paroxysmal atrial fibrillation: Secondary | ICD-10-CM

## 2024-02-05 LAB — CUP PACEART REMOTE DEVICE CHECK
Battery Remaining Longevity: 91 mo
Battery Remaining Percentage: 65 %
Battery Voltage: 3.01 V
Brady Statistic RV Percent Paced: 93 %
Date Time Interrogation Session: 20250903080028
Implantable Lead Connection Status: 753985
Implantable Lead Connection Status: 753985
Implantable Lead Connection Status: 753985
Implantable Lead Implant Date: 20120702
Implantable Lead Implant Date: 20120702
Implantable Lead Implant Date: 20210308
Implantable Lead Location: 753859
Implantable Lead Location: 753860
Implantable Lead Location: 753860
Implantable Pulse Generator Implant Date: 20210308
Lead Channel Impedance Value: 600 Ohm
Lead Channel Pacing Threshold Amplitude: 0.5 V
Lead Channel Pacing Threshold Pulse Width: 0.5 ms
Lead Channel Sensing Intrinsic Amplitude: 9.6 mV
Lead Channel Setting Pacing Amplitude: 0.75 V
Lead Channel Setting Pacing Pulse Width: 0.5 ms
Lead Channel Setting Sensing Sensitivity: 2 mV
Pulse Gen Model: 2272
Pulse Gen Serial Number: 3801383

## 2024-02-06 ENCOUNTER — Ambulatory Visit: Payer: Self-pay | Admitting: Cardiovascular Disease

## 2024-02-13 ENCOUNTER — Ambulatory Visit (INDEPENDENT_AMBULATORY_CARE_PROVIDER_SITE_OTHER): Admitting: Emergency Medicine

## 2024-02-13 ENCOUNTER — Encounter: Payer: Self-pay | Admitting: Emergency Medicine

## 2024-02-13 VITALS — BP 122/71 | HR 60 | Temp 97.6°F | Ht 59.0 in | Wt 99.0 lb

## 2024-02-13 DIAGNOSIS — R911 Solitary pulmonary nodule: Secondary | ICD-10-CM | POA: Diagnosis not present

## 2024-02-13 DIAGNOSIS — G4733 Obstructive sleep apnea (adult) (pediatric): Secondary | ICD-10-CM | POA: Diagnosis not present

## 2024-02-13 DIAGNOSIS — I2721 Secondary pulmonary arterial hypertension: Secondary | ICD-10-CM | POA: Diagnosis not present

## 2024-02-13 NOTE — Progress Notes (Signed)
 Subjective:    Patient ID: Hailey [name], female    DOB: 05/31/1946, 78 y.o.   MRN: [id number]  HPI   ROV 02/26/2023 --follow-up visit for 78 year old woman with a history of multifactorial dyspnea in the setting of restrictive disease, pulmonary hypertension from A-fib, chronic diastolic CHF and systolic hypertension with possible contribution of autoimmune disease.  She is on sildenafil .  She also has a goiter that has not impacted her trachea and a new diagnosis of mild OSA.  We started her on auto titration CPAP about 9 months ago.  She underwent right hemithyroidectomy 09/20/2022. Today she reports fairly good compliance with her CPAP for the last few months until early September.  She has been dealing with leakage.  Compliance report 12/27/2022-02/24/2023 shows usage for 70% of the nights, 63% of the nights for greater than 4 hours. She did well with her thyroid  sgy. Helped her swallowing.  She was doing well w the CPAP for several months. She went back to nasal pillows w her surgery > has helped her feel better during the day. Less SOB, better exertional tolerance.    ROV 02/13/2024 --Hailey [name] is 72 with a history of restrictive lung disease, obstructive sleep apnea, atrial fibrillation and hypertension with associated chronic diastolic CHF, suspected autoimmune disease.  She is on sildenafil  for presumed secondary PAH. Today she reports that she has been doing fairly well. She stopped her CPAP for a while to allow her to take care of her husband at night who is ill. She is interested in getting back on it. Her company is [employer], [employer].  She is on Eliquis , sildenafil , spironolactone , furosemide  40 mg twice daily   Review of Systems  Constitutional:  Positive for unexpected weight change. Negative for fever.  HENT:  Negative for congestion, dental problem, ear pain, nosebleeds, postnasal drip, rhinorrhea, sinus pressure, sneezing, sore throat and trouble swallowing.   Eyes:  Negative for  redness and itching.  Respiratory:  Positive for cough and shortness of breath. Negative for chest tightness and wheezing.   Cardiovascular:  Negative for palpitations and leg swelling.  Gastrointestinal:  Negative for nausea and vomiting.  Genitourinary:  Negative for dysuria.  Musculoskeletal:  Negative for joint swelling.  Skin:  Negative for rash.  Allergic/Immunologic: Negative.  Negative for environmental allergies, food allergies and immunocompromised state.  Neurological:  Negative for headaches.  Hematological:  Does not bruise/bleed easily.  Psychiatric/Behavioral:  Negative for dysphoric mood. The patient is not nervous/anxious.      Past Medical History:  Diagnosis Date   Acute on chronic diastolic CHF (congestive heart failure), NYHA class 1 (HCC) 04/29/2013   Allergic rhinitis    Brady-tachy syndrome (HCC)    s/p STJ dual chamber PPM by Dr Francyne   CAD (coronary artery disease)    70% RCA stenosis, treated medically   Control of atrial fibrillation with pacemaker (HCC)    Diabetes mellitus (HCC)    type 2   DJD (degenerative joint disease)    Dyspnea    occasional   Hearing loss    Right ear only - no hearing aids   Hypertension    Hyperthyroidism    Paroxysmal atrial fibrillation (HCC)    Pacemaker St Jude   Pneumonia    years ago- before 2012 per patient   Polio    as a child   PONV (postoperative nausea and vomiting)    Presence of permanent cardiac pacemaker    St Jude pacemaker   Pulmonary  disease    Sleep apnea    uses  CPAP nightly     Family History  Problem Relation Age of Onset   Cancer Brother    Heart attack Sister    Breast cancer Sister    Diabetes Sister    Heart disease Sister    Breast cancer Sister    Dementia Sister    Colon cancer Sister    Heart disease Sister    Breast cancer Sister    Suicidality Brother    Prostate cancer Brother    Dementia Brother      Social History   Socioeconomic History   Marital status:  Married    Spouse name: Hailey [name]   Number of children: 3   Years of education: 13   Highest education level: Some college, no degree  Occupational History   Occupation: housewife  Tobacco Use   Smoking status: Never   Smokeless tobacco: Never  Vaping Use   Vaping status: Never Used  Substance and Sexual Activity   Alcohol use: No   Drug use: No   Sexual activity: Not Currently    Birth control/protection: Abstinence, Surgical    Comment: Hysterectomy  Other Topics Concern   Not on file  Social History Narrative   [place] in [place].  Retired.  Was office clerk in several companies.  Did some technical school after completing high school.  She has no pets.  She and her husband live in a one-story home.  They've been married since 1965.  She follows a heart healthy diet.  She has a living will and hcpoa but does not have a DNR form.  She does struggle to prepare food.     Social Drivers of Corporate investment banker Strain: Low Risk  (11/03/2023)   Received [facility]   Overall Financial Resource Strain (CARDIA)    Difficulty of Paying Living Expenses: Not hard at all  Food Insecurity: Low Risk  (11/17/2023)   Received from [facility]   Hunger Vital Sign    Within the past 12 months, you worried that your food would run out before you got money to buy more: Never true    Within the past 12 months, the food you bought just didn't last and you didn't have money to get more. : Never true  Transportation Needs: No Transportation Needs (11/17/2023)   Received from [facility]    In the past 12 months, has lack of reliable transportation kept you from medical appointments, meetings, work or from getting things needed for daily living? : No  Physical Activity: Not on file  Stress: Not on file  Social Connections: Not on file  Intimate Partner Violence: Not At Risk (09/20/2022)   Humiliation, Afraid, Rape, and Kick questionnaire    Fear of Current or  Ex-Partner: No    Emotionally Abused: No    Physically Abused: No    Sexually Abused: No     Allergies  Allergen Reactions   Codeine Nausea And Vomiting and Nausea Only   Demerol  Hcl [Meperidine ] Nausea Only   Ketorolac  Nausea And Vomiting, Nausea Only and Other (See Comments)    pt told to never take this med   Demerol  Nausea And Vomiting   Multaq  [Dronedarone ] Other (See Comments)    Increased fluid retention   Flexeril  [Cyclobenzaprine ] Nausea And Vomiting   Meperidine  Hcl    Xarelto [Rivaroxaban] Diarrhea   Latex Rash    Pt stated  on 09/20/2022     Outpatient Medications Prior to Visit  Medication Sig Dispense Refill   acetaminophen  (TYLENOL ) 500 MG tablet Take 500-1,000 mg by mouth every 6 (six) hours as needed (pain.).     Cholecalciferol  (VITAMIN D3) 1000 UNITS CAPS Take 1,000 Units by mouth in the morning.     ELIQUIS  5 MG TABS tablet TAKE ONE TABLET BY MOUTH TWICE DAILY 60 tablet 5   furosemide  (LASIX ) 40 MG tablet Take 1 tablet (40 mg total) by mouth 2 (two) times daily. 90 tablet 3   glucose blood (ONETOUCH ULTRA) test strip USE DAILY TO CHECK BLOOD SUGAR E11.8 100 each 11   Lancets Misc. (ONE TOUCH SURESOFT) MISC Use to test  Blood sugar once daily DX: E11.8, 1 each 0   loperamide  (IMODIUM  A-D) 2 MG tablet Take 2 mg by mouth. (Patient taking differently: Take 2 mg by mouth. prn)     magnesium  oxide (MAG-OX) 400 (240 Mg) MG tablet Take 400 mg by mouth daily.     methimazole  (TAPAZOLE ) 10 MG tablet Take 20 mg (two tablets) daily.     metoprolol  tartrate (LOPRESSOR ) 25 MG tablet Take 12.5 mg by mouth in the morning and at bedtime.     ondansetron  (ZOFRAN ) 4 MG tablet TAKE ONE TABLET BY MOUTH EVERY EIGHT HOURS AS NEEDED FOR NAUSEA OR VOMITING (Patient taking differently: prn) 20 tablet 0   oxymetazoline  (AFRIN NASAL SPRAY) 0.05 % nasal spray Place 1 spray into both nostrils 2 (two) times daily. (Patient taking differently: Place 1 spray into both nostrils 2 (two) times  daily. prn) 30 mL 0   pantoprazole  (PROTONIX ) 40 MG tablet TAKE ONE TABLET BY MOUTH TWICE DAILY BEFORE A MEAL 60 tablet 5   potassium chloride  SA (KLOR-CON  M) 20 MEQ tablet TAKE ONE TABLET BY MOUTH TWICE DAILY 180 tablet 1   sildenafil  (REVATIO ) 20 MG tablet TAKE ONE TABLET BY MOUTH 3 TIMES DAILY 270 tablet 3   sodium chloride  (NASAL MOISTURIZING SPRAY) 0.65 % nasal spray Place 1 spray into the nose as needed for congestion.     spironolactone  (ALDACTONE ) 25 MG tablet TAKE ONE TABLET BY MOUTH EVERY DAY 90 tablet 2   tizanidine (ZANAFLEX) 2 MG capsule Take 2 mg by mouth at bedtime.     traMADol  (ULTRAM ) 50 MG tablet Take 50-100 mg by mouth every 8 (eight) hours as needed.     Sod Picosulfate-Mag Ox-Cit Acd (CLENPIQ ) 10-3.5-12 MG-GM -GM/175ML SOLN Take 1 kit by mouth as directed. (Patient not taking: Reported on 02/13/2024) 350 mL 0   No facility-administered medications prior to visit.       Objective:   Physical Exam Vitals:   02/13/24 1035  BP: 122/71  Pulse: 60  Temp: 97.6 F (36.4 C)  TempSrc: Oral  SpO2: 99%  Weight: 99 lb (44.9 kg)  Height: 4' 11 (1.499 m)     Gen: Pleasant, well-nourished elderly woman, in no distress,  normal affect  ENT: No lesions,  mouth clear,  oropharynx clear, no postnasal drip  Neck: JVD difficult to assess  Lungs: No use of accessory muscles, no crackles or wheezing on normal respiration, no wheeze on forced expiration  Cardiovascular: RRR, heart sounds normal, no murmur or gallops, no peripheral edema  Musculoskeletal: No deformities, no cyanosis or clubbing  Neuro: alert, awake, non focal  Skin: Warm, no lesions or rash      Assessment & Plan:  Obstructive sleep apnea She had to be off of her CPAP for  a stretch of time because she was being awakened frequently to offer care to her husband who was ill.  She is ready and able to get restarted on this.  We will restart CPAP with nasal pillows with her previous settings.  She will  follow-up in about 2 months to confirm and document good compliance.  Pulmonary nodule She has had pulmonary nodules in the past that were followed and were stable on serial imaging.  Deemed benign.  Interestingly she does have a positive ANA without any clear rheumatological diagnosis.  This could contribute to nodular disease, could also contribute to pulmonary hypertension.  We can discuss the timing of any future CT chest depending on her clinical status, progress going forward.  PAH (pulmonary artery hypertension) (HCC) Multifactorial secondary PAH.  We are working on treating the OSA.  She is on a good regimen with cardiology.  Remains on sildenafil .     Lamar Chris, MD, PhD 02/13/2024, 11:07 AM [facility] [phone] or if no answer [phone]

## 2024-02-13 NOTE — Assessment & Plan Note (Signed)
 She had to be off of her CPAP for a stretch of time because she was being awakened frequently to offer care to her husband who was ill.  She is ready and able to get restarted on this.  We will restart CPAP with nasal pillows with her previous settings.  She will follow-up in about 2 months to confirm and document good compliance.

## 2024-02-13 NOTE — Patient Instructions (Signed)
 Agree with working to get back on your CPAP.  You can use the same device and mask.  Try to wear it every night reliably.  Follow-up with us  in 2 months so we can confirm good compliance with your insurance company and with [employer]. Continues to do the fill as you have been taking Continue your diuretic medications as you have been using them. Follow-up in our office in 2 months.  Call sooner if you have any problems.

## 2024-02-13 NOTE — Progress Notes (Signed)
 Remote PPM Transmission

## 2024-02-13 NOTE — Assessment & Plan Note (Signed)
 She has had pulmonary nodules in the past that were followed and were stable on serial imaging.  Deemed benign.  Interestingly she does have a positive ANA without any clear rheumatological diagnosis.  This could contribute to nodular disease, could also contribute to pulmonary hypertension.  We can discuss the timing of any future CT chest depending on her clinical status, progress going forward.

## 2024-02-13 NOTE — Assessment & Plan Note (Signed)
 Multifactorial secondary PAH.  We are working on treating the OSA.  She is on a good regimen with cardiology.  Remains on sildenafil .

## 2024-03-02 ENCOUNTER — Ambulatory Visit: Admitting: Sports Medicine

## 2024-03-08 ENCOUNTER — Other Ambulatory Visit: Payer: Self-pay | Admitting: Adult Health

## 2024-03-25 ENCOUNTER — Telehealth: Payer: Self-pay

## 2024-03-25 NOTE — Telephone Encounter (Signed)
 Returned call to patient and advised of providers suggestion. Patient verbalized understanding     She can use flonase  and allegra, if no improvement she needs to follow up in clinic

## 2024-03-25 NOTE — Telephone Encounter (Signed)
 Copied from CRM #[id number]. Topic: Clinical - Medical Advice >> Mar 25, 2024  1:25 PM Chiquita SQUIBB wrote: Reason for CRM: Patient is calling in stating her sinuses have been draining a lot and she has been taking Zyrtec but it has not been helping. Patient is asking if the doctor thinks claritin  would help better or if something else should be prescribed . Okay to leave a voicemail on the mobile number.

## 2024-03-30 ENCOUNTER — Ambulatory Visit: Admitting: Sports Medicine

## 2024-04-05 ENCOUNTER — Encounter: Payer: Self-pay | Admitting: Cardiovascular Disease

## 2024-04-05 ENCOUNTER — Ambulatory Visit: Attending: Cardiovascular Disease | Admitting: Cardiovascular Disease

## 2024-04-05 VITALS — BP 130/64 | HR 61 | Ht 59.0 in | Wt 103.2 lb

## 2024-04-05 DIAGNOSIS — D6869 Other thrombophilia: Secondary | ICD-10-CM | POA: Diagnosis not present

## 2024-04-05 DIAGNOSIS — I272 Pulmonary hypertension, unspecified: Secondary | ICD-10-CM

## 2024-04-05 DIAGNOSIS — I2510 Atherosclerotic heart disease of native coronary artery without angina pectoris: Secondary | ICD-10-CM | POA: Diagnosis not present

## 2024-04-05 DIAGNOSIS — E118 Type 2 diabetes mellitus with unspecified complications: Secondary | ICD-10-CM | POA: Insufficient documentation

## 2024-04-05 DIAGNOSIS — I441 Atrioventricular block, second degree: Secondary | ICD-10-CM | POA: Diagnosis not present

## 2024-04-05 DIAGNOSIS — I2721 Secondary pulmonary arterial hypertension: Secondary | ICD-10-CM | POA: Diagnosis not present

## 2024-04-05 DIAGNOSIS — E049 Nontoxic goiter, unspecified: Secondary | ICD-10-CM | POA: Insufficient documentation

## 2024-04-05 DIAGNOSIS — E0590 Thyrotoxicosis, unspecified without thyrotoxic crisis or storm: Secondary | ICD-10-CM | POA: Insufficient documentation

## 2024-04-05 DIAGNOSIS — Z950 Presence of cardiac pacemaker: Secondary | ICD-10-CM | POA: Insufficient documentation

## 2024-04-05 DIAGNOSIS — I4821 Permanent atrial fibrillation: Secondary | ICD-10-CM | POA: Insufficient documentation

## 2024-04-05 DIAGNOSIS — I5032 Chronic diastolic (congestive) heart failure: Secondary | ICD-10-CM | POA: Diagnosis not present

## 2024-04-05 DIAGNOSIS — I495 Sick sinus syndrome: Secondary | ICD-10-CM | POA: Diagnosis not present

## 2024-04-05 DIAGNOSIS — E0520 Thyrotoxicosis with toxic multinodular goiter without thyrotoxic crisis or storm: Secondary | ICD-10-CM | POA: Diagnosis not present

## 2024-04-05 NOTE — Patient Instructions (Signed)
 Medication Instructions:  No changes *If you need a refill on your cardiac medications before your next appointment, please call your pharmacy*  Lab Work: None ordered If you have labs (blood work) drawn today and your tests are completely normal, you will receive your results only by: MyChart Message (if you have MyChart) OR A paper copy in the mail If you have any lab test that is abnormal or we need to change your treatment, we will call you to review the results.  Testing/Procedures: Your physician has requested that you have an Echocardiogram- After Thanksgiving preferably. Echocardiography is a painless test that uses sound waves to create images of your heart. It provides your doctor with information about the size and shape of your heart and how well your heart's chambers and valves are working. This procedure takes approximately one hour. There are no restrictions for this procedure. Please do NOT wear cologne, perfume, aftershave, or lotions (deodorant is allowed). Please arrive 15 minutes prior to your appointment time.  Please note: We ask at that you not bring children with you during ultrasound (echo/ vascular) testing. Due to room size and safety concerns, children are not allowed in the ultrasound rooms during exams. Our front office staff cannot provide observation of children in our lobby area while testing is being conducted. An adult accompanying a patient to their appointment will only be allowed in the ultrasound room at the discretion of the ultrasound technician under special circumstances. We apologize for any inconvenience.   Follow-Up: At [facility], you and your health needs are our priority.  As part of our continuing mission to provide you with exceptional heart care, our providers are all part of one team.  This team includes your primary Cardiologist (physician) and Advanced Practice Providers or APPs (Physician Assistants and Nurse Practitioners) who all  work together to provide you with the care you need, when you need it.  Your next appointment:   1 year(s)  Provider:   Jerel Balding, MD    We recommend signing up for the patient portal called MyChart.  Sign up information is provided on this After Visit Summary.  MyChart is used to connect with patients for Virtual Visits (Telemedicine).  Patients are able to view lab/test results, encounter notes, upcoming appointments, etc.  Non-urgent messages can be sent to your provider as well.   To learn more about what you can do with MyChart, go to [email or web address].

## 2024-04-05 NOTE — Progress Notes (Unsigned)
 Patient ID: Hailey [name], female   DOB: 1946-01-10, 78 y.o.   MRN: [id number]    Cardiology Office Note    Date:  04/08/2024   ID:  Atalaya, Zappia [birth year], MRN [id number]  PCP:  Sherlynn Madden, MD  Cardiologist:  Lynwood Rakers, MD;  Jerel Balding, MD   Chief Complaint  Patient presents with   Atrial Fibrillation    History of Present Illness:  Hailey [name] is a 78 y.o. female with atrial fibrillation (s/p RF ablation procedures, now with longstanding persistent atrial fibrillation), s/p dual-chamber permanent pacemaker (St. Jude Assurity, initial device 2012 for tachycardia-bradycardia syndrome and 2:1 atrioventricular block, generator change and new right ventricular lead for high pacing thresholds March 2021), chronic diastolic heart failure (history of acute decompensation on Multaq ), multifactorial pulmonary artery hypertension, CAD (nonobstructive by cath February 2019), type 2 diabetes mellitus, hypertension, good lipid profile without therapy, s/p right hemithyroidectomy for large goiter, hyperthyroidism.  She is accompanied today by her daughter Hailey [name].  Hailey [name]'s husband Hailey [name] is also my patient.  She has not had cardiac problems, but she has had a variety of other medical issues.  She suffered a lot with low back pain.  She has had recurrent periods of severe diarrhea.  She has recurrent hyperthyroidism (for years she was on antithyroid medicines until she had a partial thyroidectomy and then for a while she actually required thyroid  supplements).  This led to substantial weight loss and at 1 point she only weighed 97 pounds; she has gained a bit of weight back to 103 pounds, but still is almost 20 pounds less than it was last year.  She has not had problems with lower extremity edema, orthopnea or PND.  She routinely sleeps with the head of the bed elevated.  Denies palpitations.  Has not had dizziness or syncope.  Has not had any falls or serious bleeding problems.  She  has not been using CPAP, as she is concerned she will not hear her husband if he needs help.  Her pulmonologist is Dr. Shelah.  Pacemaker interrogation shows normal device function.  She has permanent atrial fibrillation and 94% ventricular pacing with a reasonable heart rate histogram.  She has not had episodes of high ventricular rates.  Estimated generator longevity is 7.0-7.8 years and all lead parameters are stable.  Presenting rhythm is ventricular paced.  Underlying rhythm is slow ventricular response at 40 bpm.  She feels subjectively better when she has ventricular pacing rather than native rhythm.  Her most recent echo in January 2024 showed normal left ventricular systolic function but estimated her systolic PA pressure to be about 60 mmHg.  Her right ventricle was moderately depressed and there was evidence of increased right heart filling pressures.  Right heart catheterization confirmed pulmonary hypertension 73/27 (mean 41), but there was also evidence of elevated left heart filling pressures with a wedge pressure of 23 (transpulmonary gradient 18).  PVR was 5.6 Woods units.  She had diminished cardiac output (index 2.1).    echocardiogram July 2020 showed normal left ventricular systolic function and EF of 60-65%, pseudonormal mitral inflow, moderately enlarged right ventricle, biatrial mild dilation, estimated systolic PA pressure 40 mmHg.  Previous right heart catheterization showed that her pulmonary hypertension could not be explained exclusively based on elevated left heart filling pressures.  She was evaluated in the rheumatology clinic by Dr. Mai since she had an elevated ANA of 1: 640, but all her other serological markers were negative for lupus,  rheumatoid arthritis or other connective tissue diseases.  Chest CT has shown numerous bilateral pulmonary nodules felt to be nonspecific.  These have been present back as far as 2012.    Repeat echo in January 2024 showed evidence of  dilation and weakening of the right ventricle and right heart catheterization showed PA pressure 73/27 (mean 41), wedge pressure 23, transpulmonary gradient 18, PVR 5.6 Woods units, cardiac index 2.1, PAP 3.8, mixed venous sat 56-58%.  Past Medical History:  Diagnosis Date   Acute on chronic diastolic CHF (congestive heart failure), NYHA class 1 (HCC) 04/29/2013   Allergic rhinitis    Brady-tachy syndrome (HCC)    s/p STJ dual chamber PPM by Dr Francyne   CAD (coronary artery disease)    70% RCA stenosis, treated medically   Control of atrial fibrillation with pacemaker (HCC)    Diabetes mellitus (HCC)    type 2   DJD (degenerative joint disease)    Dyspnea    occasional   Hearing loss    Right ear only - no hearing aids   Hypertension    Hyperthyroidism    Paroxysmal atrial fibrillation (HCC)    Pacemaker St Jude   Pneumonia    years ago- before 2012 per patient   Polio    as a child   PONV (postoperative nausea and vomiting)    Presence of permanent cardiac pacemaker    St Jude pacemaker   Pulmonary disease    Sleep apnea    uses  CPAP nightly    Past Surgical History:  Procedure Laterality Date   arm surgery d/t fx Right 1996   ATRIAL FIBRILLATION ABLATION  07/31/2012; 08-10-13   PVI by Dr Kelsie   ATRIAL FIBRILLATION ABLATION N/A 07/31/2012   Procedure: ATRIAL FIBRILLATION ABLATION;  Surgeon: Lynwood Kelsie, MD;  Location: [facility] CATH LAB;  Service: Cardiovascular;  Laterality: N/A;   ATRIAL FIBRILLATION ABLATION N/A 08/10/2013   Procedure: ATRIAL FIBRILLATION ABLATION;  Surgeon: Lynwood JONETTA Kelsie, MD;  Location: [facility] CATH LAB;  Service: Cardiovascular;  Laterality: N/A;   BACK SURGERY  07/26/2012   dR. nUDELMAN   BIOPSY  06/30/2023   Procedure: BIOPSY;  Surgeon: Cindie Carlin POUR, DO;  Location: [facility] ENDO SUITE;  Service: Endoscopy;;   CARDIAC CATHETERIZATION  12/03/2010   single vessel mid RCA   CARDIAC ELECTROPHYSIOLOGY MAPPING AND ABLATION  07/31/2012   Dr. Lynwood Kelsie    CESAREAN SECTION  09/20/1974   Dr. Lonni   CHOLECYSTECTOMY  1994   COLONOSCOPY WITH PROPOFOL  N/A 03/21/2015   Procedure: COLONOSCOPY WITH PROPOFOL ;  Surgeon: Renaye Sous, MD;  Location: [facility] ENDOSCOPY;  Service: Endoscopy;  Laterality: N/A;   COLONOSCOPY WITH PROPOFOL  N/A 06/30/2023   Procedure: COLONOSCOPY WITH PROPOFOL ;  Surgeon: Cindie Carlin POUR, DO;  Location: [facility] ENDO SUITE;  Service: Endoscopy;  Laterality: N/A;  11:15 AM, ASA 4   DIAGNOSTIC MAMMOGRAM  04/16/2017   Duwaine HERO. Morris, DO   ESOPHAGOGASTRODUODENOSCOPY (EGD) WITH PROPOFOL  N/A 06/30/2023   Procedure: ESOPHAGOGASTRODUODENOSCOPY (EGD) WITH PROPOFOL ;  Surgeon: Cindie Carlin POUR, DO;  Location: [facility] ENDO SUITE;  Service: Endoscopy;  Laterality: N/A;  11:15 AM, ASA 4   EYE SURGERY Bilateral    remove cataracts   LAPAROSCOPIC HYSTERECTOMY  Late 70s to early 80s   LEFT HEART CATH AND CORONARY ANGIOGRAPHY N/A 07/30/2017   Procedure: LEFT HEART CATH AND CORONARY ANGIOGRAPHY;  Surgeon: Burnard Debby LABOR, MD;  Location: [facility] INVASIVE CV LAB;  Service: Cardiovascular;  Laterality: N/A;   LUMBAR LAMINECTOMY/DECOMPRESSION  MICRODISCECTOMY Right 12/23/2012   Procedure: Right L5-S1 Laminotomy for resection of synovial cyst;  Surgeon: Lamar LELON Peaches, MD;  Location: [facility] NEURO ORS;  Service: Neurosurgery;  Laterality: Right;  Right Lumbar five-sacral one Laminotomy for resection of synovial cyst   NM MYOCAR PERF WALL MOTION  12/20/2010   normal   PACEMAKER IMPLANT N/A 08/09/2019   Procedure: PACEMAKER IMPLANT;  Surgeon: Francyne Headland, MD;  Location: [facility] INVASIVE CV LAB;  Service: Cardiovascular;  Laterality: N/A;   PACEMAKER INSERTION  12/03/2010   SJM Accent DR RF implanted by Dr Francyne   POLYPECTOMY  06/30/2023   Procedure: POLYPECTOMY;  Surgeon: Cindie Carlin POUR, DO;  Location: [facility] ENDO SUITE;  Service: Endoscopy;;   RIGHT HEART CATH N/A 04/22/2018   Procedure: RIGHT HEART CATH;  Surgeon: Darron Deatrice LABOR, MD;  Location[facility] INVASIVE CV LAB;   Service: Cardiovascular;  Laterality: N/A;   RIGHT HEART CATH N/A 07/09/2022   Procedure: RIGHT HEART CATH;  Surgeon: Cherrie Toribio SAUNDERS, MD;  Location: [facility] INVASIVE CV LAB;  Service: Cardiovascular;  Laterality: N/A;   TEE WITHOUT CARDIOVERSION N/A 07/30/2012   Procedure: TRANSESOPHAGEAL ECHOCARDIOGRAM (TEE);  Surgeon: Headland Francyne, MD;  Location: [facility] ENDOSCOPY;  Service: Cardiovascular;  Laterality: N/A;  h&p in file-hope   TEE WITHOUT CARDIOVERSION N/A 08/09/2013   Procedure: TRANSESOPHAGEAL ECHOCARDIOGRAM (TEE);  Surgeon: Redell GORMAN Shallow, MD;  Location: [facility] ENDOSCOPY;  Service: Cardiovascular;  Laterality: N/A;   THYROID  LOBECTOMY Right 09/20/2022   Procedure: RIGHT HEMITHYROIDECTOMY;  Surgeon: Llewellyn Gerard LABOR, DO;  Location: [facility] OR;  Service: ENT;  Laterality: Right;   TOTAL ABDOMINAL HYSTERECTOMY  1981   ULTRASOUND GUIDANCE FOR VASCULAR ACCESS  04/22/2018   Procedure: Ultrasound Guidance For Vascular Access;  Surgeon: Darron Deatrice LABOR, MD;  Location: [facility] INVASIVE CV LAB;  Service: Cardiovascular;;    Current Medications: Outpatient Medications Prior to Visit  Medication Sig Dispense Refill   loperamide  (IMODIUM  A-D) 2 MG tablet Take 2 mg by mouth. (Patient taking differently: Take 2 mg by mouth as needed. prn)     methimazole  (TAPAZOLE ) 10 MG tablet Take 20 mg (two tablets) daily.     metoprolol  tartrate (LOPRESSOR ) 25 MG tablet Take 12.5 mg by mouth in the morning and at bedtime.     pantoprazole  (PROTONIX ) 40 MG tablet TAKE ONE TABLET BY MOUTH TWICE DAILY BEFORE A MEAL 60 tablet 5   potassium chloride  SA (KLOR-CON  M) 20 MEQ tablet TAKE ONE TABLET BY MOUTH TWICE DAILY 180 tablet 1   sildenafil  (REVATIO ) 20 MG tablet TAKE ONE TABLET BY MOUTH 3 TIMES DAILY 270 tablet 3   sodium chloride  (NASAL MOISTURIZING SPRAY) 0.65 % nasal spray Place 1 spray into the nose as needed for congestion.     spironolactone  (ALDACTONE ) 25 MG tablet TAKE ONE TABLET BY MOUTH EVERY DAY 90 tablet 3    tizanidine (ZANAFLEX) 2 MG capsule Take 2 mg by mouth at bedtime.     acetaminophen  (TYLENOL ) 500 MG tablet Take 500-1,000 mg by mouth every 6 (six) hours as needed (pain.). (Patient not taking: Reported on 04/05/2024)     Cholecalciferol  (VITAMIN D3) 1000 UNITS CAPS Take 1,000 Units by mouth in the morning.     ELIQUIS  5 MG TABS tablet TAKE ONE TABLET BY MOUTH TWICE DAILY 60 tablet 5   furosemide  (LASIX ) 40 MG tablet Take 1 tablet (40 mg total) by mouth 2 (two) times daily. 90 tablet 3   glucose blood (ONETOUCH ULTRA) test strip USE DAILY TO CHECK BLOOD  SUGAR E11.8 100 each 11   Lancets Misc. (ONE TOUCH SURESOFT) MISC Use to test  Blood sugar once daily DX: E11.8, 1 each 0   magnesium  oxide (MAG-OX) 400 (240 Mg) MG tablet Take 400 mg by mouth daily.     ondansetron  (ZOFRAN ) 4 MG tablet TAKE ONE TABLET BY MOUTH EVERY EIGHT HOURS AS NEEDED FOR NAUSEA OR VOMITING (Patient not taking: Reported on 04/05/2024) 20 tablet 0   oxymetazoline  (AFRIN NASAL SPRAY) 0.05 % nasal spray Place 1 spray into both nostrils 2 (two) times daily. (Patient taking differently: Place 1 spray into both nostrils 2 (two) times daily. prn) 30 mL 0   Sod Picosulfate-Mag Ox-Cit Acd (CLENPIQ ) 10-3.5-12 MG-GM -GM/175ML SOLN Take 1 kit by mouth as directed. (Patient not taking: Reported on 02/13/2024) 350 mL 0   traMADol  (ULTRAM ) 50 MG tablet Take 50-100 mg by mouth every 8 (eight) hours as needed. (Patient not taking: Reported on 04/05/2024)     No facility-administered medications prior to visit.     Allergies:   Codeine, Ketorolac , Meperidine , Demerol , Multaq  [dronedarone ], Meperidine  hcl, Xarelto [rivaroxaban], Cyclobenzaprine , and Latex   Social History   Socioeconomic History   Marital status: Married    Spouse name: Hailey [name]   Number of children: 3   Years of education: 13   Highest education level: Some college, no degree  Occupational History   Occupation: housewife  Tobacco Use   Smoking status: Never   Smokeless  tobacco: Never  Vaping Use   Vaping status: Never Used  Substance and Sexual Activity   Alcohol use: No   Drug use: No   Sexual activity: Not Currently    Birth control/protection: Abstinence, Surgical    Comment: Hysterectomy  Other Topics Concern   Not on file  Social History Narrative   Lives in [place].  Retired.  Was office clerk in several companies.  Did some technical school after completing high school.  She has no pets.  She and her husband live in a one-story home.  They've been married since 1965.  She follows a heart healthy diet.  She has a living will and hcpoa but does not have a DNR form.  She does struggle to prepare food.     Social Drivers of Corporate Investment Banker Strain: Low Risk  (11/03/2023)   Received [facility]   Overall Financial Resource Strain (CARDIA)    Difficulty of Paying Living Expenses: Not hard at all  Food Insecurity: Low Risk  (11/17/2023)   Received from [facility]   Hunger Vital Sign    Within the past 12 months, you worried that your food would run out before you got money to buy more: Never true    Within the past 12 months, the food you bought just didn't last and you didn't have money to get more. : Never true  Transportation Needs: No Transportation Needs (11/17/2023)   Received from [facility]    In the past 12 months, has lack of reliable transportation kept you from medical appointments, meetings, work or from getting things needed for daily living? : No  Physical Activity: Not on file  Stress: Not on file  Social Connections: Not on file     Family History:  The patient's family history includes Breast cancer in her sister, sister, and sister; Cancer in her brother; Colon cancer in her sister; Dementia in her brother and sister; Diabetes in her sister; Heart attack in her  sister; Heart disease in her sister and sister; Prostate cancer in her brother; Suicidality in her brother.   ROS:   Please  see the history of present illness.    All other systems are reviewed and are negative.   PHYSICAL EXAM:   VS:  BP 130/64 (BP Location: Left Arm, Patient Position: Sitting, Cuff Size: Small)   Pulse 61   Ht 4' 11 (1.499 m)   Wt 103 lb 3.2 oz (46.8 kg)   SpO2 98%   BMI 20.84 kg/m       General: Alert, oriented x3, no distress, appears very thin, healthy left subclavian pacemaker site Head: no evidence of trauma, PERRL, EOMI, no exophtalmos or lid lag, no myxedema, no xanthelasma; normal ears, nose and oropharynx Neck: Elevated jugular venous pulsations and prompt hepatojugular reflux; brisk carotid pulses without delay and no carotid bruits Chest: clear to auscultation, no signs of consolidation by percussion or palpation, normal fremitus, symmetrical and full respiratory excursions Cardiovascular: normal position and quality of the apical impulse, regular rhythm, normal first and paradoxically split second heart sounds, no murmurs, rubs or gallops Abdomen: no tenderness or distention, no masses by palpation, no abnormal pulsatility or arterial bruits, normal bowel sounds, no hepatosplenomegaly Extremities: no clubbing, cyanosis or edema; 2+ radial, ulnar and brachial pulses bilaterally; 2+ right femoral, posterior tibial and dorsalis pedis pulses; 2+ left femoral, posterior tibial and dorsalis pedis pulses; no subclavian or femoral bruits Neurological: grossly nonfocal Psych: Normal mood and affect   Wt Readings from Last 3 Encounters:  04/05/24 103 lb 3.2 oz (46.8 kg)  02/13/24 99 lb (44.9 kg)  01/02/24 100 lb (45.4 kg)     Studies Reviewed: SABRA     EKG Interpretation Date/Time:  Tuesday March 04 2023 12:02:58 EDT Ventricular Rate:  60 PR Interval:    QRS Duration:  120 QT Interval:  464 QTC Calculation: 464 R Axis:   -48  Text Interpretation: Ventricular-paced rhythm When compared with ECG of 09-Jul-2022 08:44, Electronic ventricular pacemaker has replaced Wide QRS  rhythm Confirmed by Kieran Nachtigal [phone]) on 03/04/2023 12:13:11 PM    Right heart catheterization 07/09/2022 (weight 127.9 pounds) RA = 12 RV = 76/11 PA = 73/27 (41) PCW = 23 Fick cardiac output/index = 3.2/2.1 PVR = 5.6 WU Ao sat = 96% PA sat = 56%, 58% PAPi = 3.8   Assessment: 1. Mixed moderate pulmonary venous and pulmonary arterial HTN with elevated filling pressures and low cardiac output   Plan/Discussion:    Options limited. Not candidate for selective pulmonary artery vasodilators. Would push diuretics as tolerated. Ensure oxygenation > 88% at all times. Consider pulmonary rehab.    Echocardiogram 07/01/2022  1. Evidence for moderate pulmonary HTN Estimated PAs pressure 62 mmHg.   2. Left ventricular ejection fraction, by estimation, is 60 to 65%. The  left ventricle has normal function. The left ventricle has no regional  wall motion abnormalities. Left ventricular diastolic parameters were  normal.   3. Catheter in RA/RV . Right ventricular systolic function is moderately  reduced. The right ventricular size is normal. There is moderately  elevated pulmonary artery systolic pressure.   4. Left atrial size was mildly dilated.   5. Right atrial size was moderately dilated.   6. The mitral valve is abnormal. No evidence of mitral valve  regurgitation. No evidence of mitral stenosis.   7. Tricuspid valve regurgitation is severe.   8. The aortic valve is tricuspid. Aortic valve regurgitation is trivial.  No aortic  stenosis is present.   9. The inferior vena cava is dilated in size with <50% respiratory  variability, suggesting right atrial pressure of 15 mmHg.   Risk Assessment/Calculations:   CHA2DS2-VASc Score = 7  The patient's score is based upon: CHF History: 1 HTN History: 1 Diabetes History: 1 Stroke History: 0 Vascular Disease History: 1 Age Score: 2 Gender Score: 1           Lipid Panel    Component Value Date/Time   CHOL 125 11/13/2020 0955    TRIG 70 11/13/2020 0955   HDL 50 11/13/2020 0955   CHOLHDL 2.5 11/13/2020 0955   VLDL 10 11/24/2010 0837   LDLCALC 60 11/13/2020 0955   BMET    Component Value Date/Time   NA 134 01/12/2024 1401   K 4.7 01/12/2024 1401   CL 94 (L) 01/12/2024 1401   CO2 25 01/12/2024 1401   GLUCOSE 105 (H) 01/12/2024 1401   GLUCOSE 112 11/07/2023 1611   BUN 15 01/12/2024 1401   CREATININE 0.99 01/12/2024 1401   CREATININE 0.94 11/07/2023 1611   CALCIUM  9.2 01/12/2024 1401   GFRNONAA 50 (L) 05/25/2023 1030   GFRNONAA 43 (L) 11/13/2020 0955   GFRAA 50 (L) 11/13/2020 0955   Hemoglobin A1c 6.3% on 09/16/2022 ASSESSMENT:    1. Chronic diastolic CHF (congestive heart failure), NYHA class 2 (HCC)   2. PAH (pulmonary artery hypertension) (HCC)   3. Permanent atrial fibrillation (HCC)   4. Acquired thrombophilia   5. Second degree AV block   6. SSS (sick sinus syndrome) (HCC)   7. Pacemaker   8. Coronary artery disease involving native coronary artery of native heart without angina pectoris   9. Controlled type 2 diabetes mellitus with complication, without long-term current use of insulin  (HCC)   10. Thyroid  goiter   11. Hyperthyroidism        PLAN:  In order of problems listed above:   CHF: Functional status assessment is difficult with her other medical problems, intra-articular thyrotoxicosis, but at worst she has NYHA functional class II.  She has elevated JVD (which could be independent of heart failure, due to pulmonary artery hypertension), but otherwise appears euvolemic she has normal LV systolic function, but mildly elevated PCWP at cath consistent with diastolic heart failure.  Intolerant of SGLT2 inhibitors due to frequent yeast infections and muscle cramps.  Her dry weight is no longer clear, after her massive weight loss with thyroid  disease.  In July her proBNP was elevated at 4891, roughly double what it had been at the time of her cardiac catheterization.  Continue with the  current dose of furosemide  and spironolactone .  Echocardiogram will not be able to provide information regarding diastolic filling parameters due to atrial fibrillation. PAH: Reevaluate with a repeat echocardiogram.  She has evidence of RV dysfunction by physical exam.  Clearly has a component of left heart failure (WHO group 2), but the major mechanism of her pulmonary hypertension is precapillary.  The exact mechanism is uncertain although she has a markedly positive ANA without other findings to suggest systemic autoimmune disease.  Has OSA, probably a component of WHO group 3 PAH as well.  No evidence of progressive interstitial lung disease by imaging studies.  Empirically on sildenafil . AFib: permanent arrhythmia with slow ventricular rates and virtually 100% ventricular pacing.   She has a remarkably narrow paced QRS complex and actually feels better with ventricular pacing compared to when we allow native AV conduction during threshold testing or when  we check underlying rhythm.  CHADSVasc at least 7 (age 61, gender, DM, CAD, HTN, CHF). Anticoagulation: No bleeding problems.  Recent hemoglobin 11.7 in June.  Has a history of GI intolerance to Xarelto. 2nd deg AVB: 97 % ventricular paced rhythm.  She feels best when she has ventricular pacing.  She felt extremely poorly when she lost ventricular capture in the past and she feels unwell when we check underlying rhythm or perform ventricular pacing threshold checks.   SSS: Precedes diagnoses of AV block and persistent atrial fibrillation and was the initial reason for device implantation. PPM: Normal device function, but she has an abandoned right ventricular lead.  Received a new generator and a new right ventricular lead in 2020.  Although she has a MRI conditional generator, she has an abandoned ventricular lead so the system is not MRI conditional.  Continue remote downloads every 3 months. CAD: Does not have angina pectoris.  Had nonobstructive  disease at most recent angiogram in February 2019.  Not on lipid-lowering therapy with most recent LDL cholesterol 60.  With her current thyroid  problems lipid parameters would be probably misleading. DM: Acceptable glycemic control.  Most recent hemoglobin A1c 7.3% in December 2024 Thyrotoxic goiter: s/p hemithyroidectomy, was euthyroid a year ago, now is again thyrotoxic.  Clinically improving over the last few months with some recovery of her lost weight.  The most recent TSH I can find is still completely suppressed.   Patient Instructions  Medication Instructions:  No changes *If you need a refill on your cardiac medications before your next appointment, please call your pharmacy*  Lab Work: None ordered If you have labs (blood work) drawn today and your tests are completely normal, you will receive your results only by: MyChart Message (if you have MyChart) OR A paper copy in the mail If you have any lab test that is abnormal or we need to change your treatment, we will call you to review the results.  Testing/Procedures: Your physician has requested that you have an Echocardiogram- After Thanksgiving preferably. Echocardiography is a painless test that uses sound waves to create images of your heart. It provides your doctor with information about the size and shape of your heart and how well your heart's chambers and valves are working. This procedure takes approximately one hour. There are no restrictions for this procedure. Please do NOT wear cologne, perfume, aftershave, or lotions (deodorant is allowed). Please arrive 15 minutes prior to your appointment time.  Please note: We ask at that you not bring children with you during ultrasound (echo/ vascular) testing. Due to room size and safety concerns, children are not allowed in the ultrasound rooms during exams. Our front office staff cannot provide observation of children in our lobby area while testing is being conducted. An adult  accompanying a patient to their appointment will only be allowed in the ultrasound room at the discretion of the ultrasound technician under special circumstances. We apologize for any inconvenience.   Follow-Up: At [facility], you and your health needs are our priority.  As part of our continuing mission to provide you with exceptional heart care, our providers are all part of one team.  This team includes your primary Cardiologist (physician) and Advanced Practice Providers or APPs (Physician Assistants and Nurse Practitioners) who all work together to provide you with the care you need, when you need it.  Your next appointment:   1 year(s)  Provider:   Jerel Balding, MD    We  recommend signing up for the patient portal called MyChart.  Sign up information is provided on this After Visit Summary.  MyChart is used to connect with patients for Virtual Visits (Telemedicine).  Patients are able to view lab/test results, encounter notes, upcoming appointments, etc.  Non-urgent messages can be sent to your provider as well.   To learn more about what you can do with MyChart, go to [email or web address].      Signed, Jerel Balding, MD

## 2024-04-08 ENCOUNTER — Encounter: Payer: Self-pay | Admitting: Cardiovascular Disease

## 2024-04-08 LAB — COMPLETE METABOLIC PANEL WITHOUT GFR
AG Ratio: 1.6 (calc) (ref 1.0–2.5)
ALT: 9 U/L (ref 6–29)
AST: 13 U/L (ref 10–35)
Albumin: 4 g/dL (ref 3.6–5.1)
Alkaline phosphatase (APISO): 100 U/L (ref 37–153)
BUN: 20 mg/dL (ref 7–25)
CO2: 25 mmol/L (ref 20–32)
Calcium: 9.7 mg/dL (ref 8.6–10.4)
Chloride: 96 mmol/L — ABNORMAL LOW (ref 98–110)
Creat: 0.94 mg/dL (ref 0.60–1.00)
Globulin: 2.5 g/dL (ref 1.9–3.7)
Glucose, Bld: 112 mg/dL (ref 65–139)
Potassium: 4.5 mmol/L (ref 3.5–5.3)
Sodium: 134 mmol/L — ABNORMAL LOW (ref 135–146)
Total Bilirubin: 1.3 mg/dL — ABNORMAL HIGH (ref 0.2–1.2)
Total Protein: 6.5 g/dL (ref 6.1–8.1)

## 2024-04-08 LAB — CBC WITH DIFFERENTIAL/PLATELET
Absolute Lymphocytes: 1175 {cells}/uL (ref 850–3900)
Absolute Monocytes: 767 {cells}/uL (ref 200–950)
Basophils Absolute: 58 {cells}/uL (ref 0–200)
Basophils Relative: 0.8 %
Eosinophils Absolute: 139 {cells}/uL (ref 15–500)
Eosinophils Relative: 1.9 %
HCT: 40 % (ref 35.0–45.0)
Hemoglobin: 11.7 g/dL (ref 11.7–15.5)
MCH: 23.4 pg — ABNORMAL LOW (ref 27.0–33.0)
MCHC: 29.3 g/dL — ABNORMAL LOW (ref 32.0–36.0)
MCV: 80 fL (ref 80.0–100.0)
MPV: 11.5 fL (ref 7.5–12.5)
Monocytes Relative: 10.5 %
Neutro Abs: 5161 {cells}/uL (ref 1500–7800)
Neutrophils Relative %: 70.7 %
Platelets: 329 Thousand/uL (ref 140–400)
RBC: 5 Million/uL (ref 3.80–5.10)
RDW: 16 % — ABNORMAL HIGH (ref 11.0–15.0)
Total Lymphocyte: 16.1 %
WBC: 7.3 Thousand/uL (ref 3.8–10.8)

## 2024-04-14 ENCOUNTER — Ambulatory Visit (INDEPENDENT_AMBULATORY_CARE_PROVIDER_SITE_OTHER): Admitting: Nurse Practitioner

## 2024-04-14 ENCOUNTER — Encounter: Payer: Self-pay | Admitting: Nurse Practitioner

## 2024-04-14 VITALS — BP 124/70 | HR 60 | Temp 97.5°F | Ht 59.0 in | Wt 105.0 lb

## 2024-04-14 DIAGNOSIS — Z23 Encounter for immunization: Secondary | ICD-10-CM | POA: Diagnosis not present

## 2024-04-14 DIAGNOSIS — G4733 Obstructive sleep apnea (adult) (pediatric): Secondary | ICD-10-CM

## 2024-04-14 DIAGNOSIS — R911 Solitary pulmonary nodule: Secondary | ICD-10-CM | POA: Diagnosis not present

## 2024-04-14 DIAGNOSIS — I2721 Secondary pulmonary arterial hypertension: Secondary | ICD-10-CM | POA: Diagnosis not present

## 2024-04-14 NOTE — Patient Instructions (Addendum)
 Continue to use CPAP every night, minimum of 4-6 hours a night.  Change equipment as directed. Wash your tubing with warm soap and water daily, hang to dry. Wash humidifier portion weekly. Use bottled, distilled water and change daily Be aware of reduced alertness and do not drive or operate heavy machinery if experiencing this or drowsiness.  Exercise encouraged, as tolerated. Healthy weight management discussed.  Avoid or decrease alcohol consumption and medications that make you more sleepy, if possible. Notify if persistent daytime sleepiness occurs even with consistent use of PAP therapy.  Change CPAP supplies... Every month Mask cushions and/or nasal pillows CPAP machine filters Every 3 months Mask frame (not including the headgear) CPAP tubing Every 6 months Mask headgear Chin strap (if applicable) Humidifier water tub  Follow up with cardiology as scheduled  Continue to manage your pulmonary hypertension   Recommend RSV and COVID vaccines   Follow up in 1 year with Dr. Shelah. If symptoms do not improve or worsen, please contact office for sooner follow up or seek emergency care.

## 2024-04-14 NOTE — Progress Notes (Addendum)
 @Patient  ID: Hailey [name], female    DOB: Mar 25, 1946, 78 y.o.   MRN: [id number]  Chief Complaint  Patient presents with   Obstructive Sleep Apnea    Referring provider: Sherlynn Madden, *  HPI: 78 year old female, never smoker followed for restrictive lung disease, pulmonary nodules, mild OSA and PAH. She is a patient of Dr. Lanny and last seen in office on 02/13/2024. Past medical history significant for diastolic CHF, SSS, PAF on chronic anticoagulation, HTN, multinodular goiter, DM II,   TEST/EVENTS:  08/17/2021 CT Chest w contrast: there is marked enlargement of the thyroid . Right hilar nodes stable dating back to 2019. Scattered pulmonary nodules through the chest and stable.  08/17/2021 CT neck: heterogeneous enlargement of the thyroid  with innumerable presumed underlying nodules and coarse calcifications. There is encircling of the trachea without worrisome narrowing 11/04/2021 HST: AHI 5.4, SpO2 low 81%  02/13/2024: Ov with Dr. Shelah. Doing fairly well. Stopped CPAP a while ago to take care of her husband at night, who was ill. She is interested in getting back on it. On sildenafil , spironolactone  and furosemide  for [facility] prescribed by cardiology. Had pulmonary nodules in the past that were followed and were stable on repeat imaging. No need for dedicated follow up CT; dependent on clinical status.   04/14/2024: Today - follow up Discussed the use of AI scribe software for clinical note transcription with the patient, who gave verbal consent to proceed.  History of Present Illness [name] is a 78 year old female with pulmonary hypertension and sleep apnea who presents for a follow-up on CPAP compliance and management. Her daughter is with her today.  She has been using her CPAP machine consistently since September, with excellent compliance, using it almost every night. She feels better since restarting the CPAP therapy. No drowsy driving.  She is scheduled to undergo  another heart scan as part of her ongoing cardiac evaluation. No new symptoms  Breathing has been doing well. She would like a flu shot today. She has not experienced prior side effects from flu vaccinations.  She has not received a COVID booster or RSV vaccine recently.  She experiences sinus congestion, which she attributes to allergies. Symptoms are at baseline. She does take allergy medicine for this. No concerns or complaints today.    Allergies  Allergen Reactions   Codeine Nausea And Vomiting and Nausea Only   Ketorolac  Nausea And Vomiting, Nausea Only and Other (See Comments)    pt told to never take this med   Meperidine  Nausea Only and Nausea And Vomiting   Demerol  Nausea And Vomiting   Multaq  [Dronedarone ] Other (See Comments)    Increased fluid retention   Meperidine  Hcl    Xarelto [Rivaroxaban] Diarrhea   Cyclobenzaprine  Nausea And Vomiting   Latex Rash    Pt stated on 09/20/2022    Immunization History  Administered Date(s) Administered   Fluad Quad(high Dose 65+) 01/21/2019, 03/27/2020, 05/22/2021, 03/06/2022   Fluad Trivalent(High Dose 65+) 04/15/2023   INFLUENZA, HIGH DOSE SEASONAL PF 02/09/2018, 04/14/2024   Influenza Whole 02/19/2013   PFIZER(Purple Top)SARS-COV-2 Vaccination 07/08/2019, 07/29/2019   PNEUMOCOCCAL CONJUGATE-20 05/13/2023   Pfizer Covid-19 Vaccine Bivalent Booster 75yrs & up 05/12/2020, 05/04/2021   Pneumococcal Polysaccharide-23 02/09/2018   Pneumococcal-Unspecified 04/29/2011   Tdap 03/05/2013    Past Medical History:  Diagnosis Date   Acute on chronic diastolic CHF (congestive heart failure), NYHA class 1 (HCC) 04/29/2013   Allergic rhinitis    Brady-tachy syndrome (HCC)  s/p STJ dual chamber PPM by Dr Francyne   CAD (coronary artery disease)    70% RCA stenosis, treated medically   Control of atrial fibrillation with pacemaker (HCC)    Diabetes mellitus (HCC)    type 2   DJD (degenerative joint disease)    Dyspnea     occasional   Hearing loss    Right ear only - no hearing aids   Hypertension    Hyperthyroidism    Paroxysmal atrial fibrillation (HCC)    Pacemaker St Jude   Pneumonia    years ago- before 2012 per patient   Polio    as a child   PONV (postoperative nausea and vomiting)    Presence of permanent cardiac pacemaker    St Jude pacemaker   Pulmonary disease    Sleep apnea    uses  CPAP nightly    Tobacco History: Social History   Tobacco Use  Smoking Status Never  Smokeless Tobacco Never   Counseling given: Not Answered   Outpatient Medications Prior to Visit  Medication Sig Dispense Refill   acetaminophen  (TYLENOL ) 500 MG tablet Take 500-1,000 mg by mouth every 6 (six) hours as needed (pain.).     Cholecalciferol  (VITAMIN D3) 1000 UNITS CAPS Take 1,000 Units by mouth in the morning.     ELIQUIS  5 MG TABS tablet TAKE ONE TABLET BY MOUTH TWICE DAILY 60 tablet 5   furosemide  (LASIX ) 40 MG tablet Take 1 tablet (40 mg total) by mouth 2 (two) times daily. 90 tablet 3   glucose blood (ONETOUCH ULTRA) test strip USE DAILY TO CHECK BLOOD SUGAR E11.8 100 each 11   Lancets Misc. (ONE TOUCH SURESOFT) MISC Use to test  Blood sugar once daily DX: E11.8, 1 each 0   loperamide  (IMODIUM  A-D) 2 MG tablet Take 2 mg by mouth. (Patient taking differently: Take 2 mg by mouth as needed. prn)     methimazole  (TAPAZOLE ) 10 MG tablet Take 20 mg (two tablets) daily.     metoprolol  tartrate (LOPRESSOR ) 25 MG tablet Take 12.5 mg by mouth in the morning and at bedtime.     ondansetron  (ZOFRAN ) 4 MG tablet TAKE ONE TABLET BY MOUTH EVERY EIGHT HOURS AS NEEDED FOR NAUSEA OR VOMITING 20 tablet 0   pantoprazole  (PROTONIX ) 40 MG tablet TAKE ONE TABLET BY MOUTH TWICE DAILY BEFORE A MEAL 60 tablet 5   potassium chloride  SA (KLOR-CON  M) 20 MEQ tablet TAKE ONE TABLET BY MOUTH TWICE DAILY 180 tablet 1   sildenafil  (REVATIO ) 20 MG tablet TAKE ONE TABLET BY MOUTH 3 TIMES DAILY 270 tablet 3   sodium chloride  (NASAL  MOISTURIZING SPRAY) 0.65 % nasal spray Place 1 spray into the nose as needed for congestion.     spironolactone  (ALDACTONE ) 25 MG tablet TAKE ONE TABLET BY MOUTH EVERY DAY 90 tablet 3   tizanidine (ZANAFLEX) 2 MG capsule Take 2 mg by mouth at bedtime.     traMADol  (ULTRAM ) 50 MG tablet Take 50-100 mg by mouth every 8 (eight) hours as needed.     No facility-administered medications prior to visit.     Review of Systems: as above    Physical Exam:  BP 124/70   Pulse 60   Temp (!) 97.5 F (36.4 C)   Ht 4' 11 (1.499 m)   Wt 105 lb (47.6 kg)   SpO2 98% Comment: ra  BMI 21.21 kg/m   GEN: Pleasant, interactive, well-appearing; in no acute distress. HEENT:  Normocephalic and atraumatic.  PERRLA. Sclera white. Nasal turbinates pink, moist and patent bilaterally. No rhinorrhea present. Oropharynx pink and moist, without exudate or edema. No lesions, ulcerations, or postnasal drip.  NECK:  Supple w/ fair ROM. No JVD present. Normal carotid impulses w/o bruits. Thyroid  enlarged; right lobe > left (chronic). No lymphadenopathy.   CV: RRR, no m/r/g, no peripheral edema. Pulses intact, +2 bilaterally. No cyanosis, pallor or clubbing. PULMONARY:  Unlabored, regular breathing. Clear bilaterally A&P w/o wheezes/rales/rhonchi. No accessory muscle use. N GI: BS present and normoactive. Soft, non-tender to palpation.  MSK: No erythema, warmth or tenderness.  Neuro: A/Ox3. No focal deficits noted.   Skin: Warm, no lesions or rashe Psych: Normal affect and behavior. Judgement and thought content appropriate.     Lab Results:  CBC    Component Value Date/Time   WBC 7.3 11/07/2023 1611   RBC 5.00 11/07/2023 1611   HGB 11.7 11/07/2023 1611   HGB 10.3 (L) 08/11/2023 1514   HCT 40.0 11/07/2023 1611   HCT 35.4 08/11/2023 1514   PLT 329 11/07/2023 1611   PLT 353 08/11/2023 1514   MCV 80.0 11/07/2023 1611   MCV 80 08/11/2023 1514   MCH 23.4 (L) 11/07/2023 1611   MCHC 29.3 (L) 11/07/2023 1611    RDW 16.0 (H) 11/07/2023 1611   RDW 14.5 08/11/2023 1514   LYMPHSABS 1.0 08/11/2023 1514   MONOABS 0.5 04/25/2018 0449   EOSABS 139 11/07/2023 1611   EOSABS 0.1 08/11/2023 1514   BASOSABS 58 11/07/2023 1611   BASOSABS 0.1 08/11/2023 1514    BMET    Component Value Date/Time   NA 134 01/12/2024 1401   K 4.7 01/12/2024 1401   CL 94 (L) 01/12/2024 1401   CO2 25 01/12/2024 1401   GLUCOSE 105 (H) 01/12/2024 1401   GLUCOSE 112 11/07/2023 1611   BUN 15 01/12/2024 1401   CREATININE 0.99 01/12/2024 1401   CREATININE 0.94 11/07/2023 1611   CALCIUM  9.2 01/12/2024 1401   GFRNONAA 50 (L) 05/25/2023 1030   GFRNONAA 43 (L) 11/13/2020 0955   GFRAA 50 (L) 11/13/2020 0955    BNP    Component Value Date/Time   BNP 116.3 (H) 05/12/2023 1626   BNP 393.4 (H) 04/19/2018 1140     Imaging:  No results found.  Administration History     None          Latest Ref Rng & Units 07/09/2018   10:53 AM 04/22/2018    5:11 PM 09/29/2017   12:43 PM  PFT Results  FVC-Pre L 1.80  1.44  1.35   FVC-Predicted Pre % 78  62  58   FVC-Post L 1.87     FVC-Predicted Post % 81     Pre FEV1/FVC % % 80  77  83   Post FEV1/FCV % % 80     FEV1-Pre L 1.44  1.10  1.11   FEV1-Predicted Pre % 83  64  64   FEV1-Post L 1.51     DLCO uncorrected ml/min/mmHg 13.18  7.74  9.60   DLCO UNC% % 81  44  54   DLCO corrected ml/min/mmHg  8.91    DLCO COR %Predicted %  50    DLVA Predicted % 92  87  93   TLC L 3.78  3.42  3.57   TLC % Predicted % 87  79  83   RV % Predicted % 100  98  111     No results found for: NITRICOXIDE  Assessment & Plan:   Obstructive sleep apnea Mild OSA on CPAP. Excellent compliance and control. Receives benefit from use. Aware of risks of untreated OSA. Encouraged to continue utilizing nightly. Aware of proper care/use of device. Safe driving practices reviewed.   Patient Instructions  Continue to use CPAP every night, minimum of 4-6 hours a night.  Change equipment  as directed. Wash your tubing with warm soap and water daily, hang to dry. Wash humidifier portion weekly. Use bottled, distilled water and change daily Be aware of reduced alertness and do not drive or operate heavy machinery if experiencing this or drowsiness.  Exercise encouraged, as tolerated. Healthy weight management discussed.  Avoid or decrease alcohol consumption and medications that make you more sleepy, if possible. Notify if persistent daytime sleepiness occurs even with consistent use of PAP therapy.  Change CPAP supplies... Every month Mask cushions and/or nasal pillows CPAP machine filters Every 3 months Mask frame (not including the headgear) CPAP tubing Every 6 months Mask headgear Chin strap (if applicable) Humidifier water tub  Follow up with cardiology as scheduled  Continue to manage your pulmonary hypertension   Recommend RSV and COVID vaccines   Follow up in 1 year with Dr. Shelah. If symptoms do not improve or worsen, please contact office for sooner follow up or seek emergency care.    Pulmonary nodule Considered benign. No dedicated f/u required  PAH (pulmonary artery hypertension) (HCC) Euvolemic on exam and symptomatically stable. Continue with cardiology   Immunization due Flu shot today     I spent 25 minutes of dedicated to the care of this patient on the date of this encounter to include pre-visit review of records, face-to-face time with the patient discussing conditions above, post visit ordering of testing, clinical documentation with the electronic health record, making appropriate referrals as documented, and communicating necessary findings to members of the patients care team.  Comer LULLA Rouleau, NP 04/14/2024  Pt aware and understands NP's role.

## 2024-04-14 NOTE — Assessment & Plan Note (Signed)
 Flu shot today

## 2024-04-14 NOTE — Assessment & Plan Note (Signed)
 Considered benign. No dedicated f/u required

## 2024-04-14 NOTE — Assessment & Plan Note (Signed)
 Euvolemic on exam and symptomatically stable. Continue with cardiology

## 2024-04-14 NOTE — Assessment & Plan Note (Signed)
 Mild OSA on CPAP. Excellent compliance and control. Receives benefit from use. Aware of risks of untreated OSA. Encouraged to continue utilizing nightly. Aware of proper care/use of device. Safe driving practices reviewed.   Patient Instructions  Continue to use CPAP every night, minimum of 4-6 hours a night.  Change equipment as directed. Wash your tubing with warm soap and water daily, hang to dry. Wash humidifier portion weekly. Use bottled, distilled water and change daily Be aware of reduced alertness and do not drive or operate heavy machinery if experiencing this or drowsiness.  Exercise encouraged, as tolerated. Healthy weight management discussed.  Avoid or decrease alcohol consumption and medications that make you more sleepy, if possible. Notify if persistent daytime sleepiness occurs even with consistent use of PAP therapy.  Change CPAP supplies... Every month Mask cushions and/or nasal pillows CPAP machine filters Every 3 months Mask frame (not including the headgear) CPAP tubing Every 6 months Mask headgear Chin strap (if applicable) Humidifier water tub  Follow up with cardiology as scheduled  Continue to manage your pulmonary hypertension   Recommend RSV and COVID vaccines   Follow up in 1 year with Dr. Shelah. If symptoms do not improve or worsen, please contact office for sooner follow up or seek emergency care.

## 2024-04-21 ENCOUNTER — Ambulatory Visit: Payer: Self-pay

## 2024-04-21 NOTE — Telephone Encounter (Signed)
 See triage notes from triage nurse.

## 2024-04-21 NOTE — Telephone Encounter (Signed)
 FYI Only or Action Required?: FYI only for provider: appointment scheduled on 04/23/24.  Patient was last seen in primary care on 11/07/2023 by Ngetich, Roxan BROCKS, NP.  Called Nurse Triage reporting Cough.  Symptoms began several months ago.  Interventions attempted: OTC medications: syrtec, flonase , allegra.  Symptoms are: gradually worsening.  Triage Disposition: See PCP When Office is Open (Within 3 Days)  Patient/caregiver understands and will follow disposition?:   Copied from CRM [id number]. Topic: Clinical - Red Word Triage >> Apr 21, 2024  1:28 PM Diannia H wrote: [facility] that prompted transfer to Nurse Triage: Patient has a bad cough, she was seen for it before by Dr. Sherlynn before she left and she was told by the provider to grab a few over the counter medicines but none of them seems to be working. She is also draining her sinuses really bad to the point every 15 minutes she is having to blow her nose or etc. She has some shortness of breath and none of the issues are clearing up. Reason for Disposition  Cough has been present for > 3 weeks  Answer Assessment - Initial Assessment Questions Pt reports having a productive cough with clear sputum over the past 2.5 - 3 months ago with 4/10 pain in right breast crease. Mychart message sent to provider on 10/23 by pt regarding symptoms, recs at that time to continue taking zyrtec and start flonase  and allegra. Pt reports symptoms persist. Denies sob but reports feeling like she has to take a deep breath every so often. Declines appt tomorrow, states the earliest she can come in would be Friday. Scheduled appt with different provider at home office d/t PCP no longer being at practice. Offered to scheduled TOC appt, pt declines at this time and will reach out later to schedule. Advised UC or ED for worsening symptoms.   1. ONSET: When did the cough begin?      2.5 -3 months ago  2. SEVERITY: How bad is the cough today?       Moderate  3. SPUTUM: Describe the color of your sputum (e.g., none, dry cough; clear, white, yellow, green)     Clear  4. HEMOPTYSIS: Are you coughing up any blood? If Yes, ask: How much? (e.g., flecks, streaks, tablespoons, etc.)     Denies  5. DIFFICULTY BREATHING: Are you having difficulty breathing? If Yes, ask: How bad is it? (e.g., mild, moderate, severe)      Has to take a deep breath intermittently d/t pain in right breast crease.  6. FEVER: Do you have a fever? If Yes, ask: What is your temperature, how was it measured, and when did it start?     Denies  7. CARDIAC HISTORY: Do you have any history of heart disease? (e.g., heart attack, congestive heart failure)      Reports hx of CHF and afib  8. LUNG HISTORY: Do you have any history of lung disease?  (e.g., pulmonary embolus, asthma, emphysema)     Denies  9. PE RISK FACTORS: Do you have a history of blood clots? (or: recent major surgery, recent prolonged travel, bedridden)     Denies  10. OTHER SYMPTOMS: Do you have any other symptoms? (e.g., runny nose, wheezing, chest pain)       Right breast crease with constant 4/10 pain sore pain, pt thinks due to constant coughing. Reports BP has been a little higher than her normal, most recent readings 131/63 and 121/61, HR in the 60's.  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  Protocols used: Cough - Acute Productive-A-AH

## 2024-04-23 ENCOUNTER — Encounter: Payer: Self-pay | Admitting: Adult Health

## 2024-04-23 ENCOUNTER — Ambulatory Visit: Admitting: Adult Health

## 2024-04-23 VITALS — BP 122/62 | HR 71 | Temp 97.2°F | Ht 59.0 in | Wt 103.8 lb

## 2024-04-23 DIAGNOSIS — I2721 Secondary pulmonary arterial hypertension: Secondary | ICD-10-CM | POA: Diagnosis not present

## 2024-04-23 DIAGNOSIS — I25119 Atherosclerotic heart disease of native coronary artery with unspecified angina pectoris: Secondary | ICD-10-CM

## 2024-04-23 DIAGNOSIS — G4733 Obstructive sleep apnea (adult) (pediatric): Secondary | ICD-10-CM | POA: Diagnosis not present

## 2024-04-23 DIAGNOSIS — I5032 Chronic diastolic (congestive) heart failure: Secondary | ICD-10-CM | POA: Diagnosis not present

## 2024-04-23 DIAGNOSIS — J302 Other seasonal allergic rhinitis: Secondary | ICD-10-CM

## 2024-04-23 DIAGNOSIS — R911 Solitary pulmonary nodule: Secondary | ICD-10-CM

## 2024-04-23 DIAGNOSIS — I480 Paroxysmal atrial fibrillation: Secondary | ICD-10-CM | POA: Diagnosis not present

## 2024-04-23 DIAGNOSIS — E0590 Thyrotoxicosis, unspecified without thyrotoxic crisis or storm: Secondary | ICD-10-CM | POA: Diagnosis not present

## 2024-04-23 LAB — COMPREHENSIVE METABOLIC PANEL WITH GFR
AG Ratio: 1.4 (calc) (ref 1.0–2.5)
ALT: 8 U/L (ref 6–29)
AST: 12 U/L (ref 10–35)
Albumin: 3.8 g/dL (ref 3.6–5.1)
Alkaline phosphatase (APISO): 137 U/L (ref 37–153)
BUN: 13 mg/dL (ref 7–25)
CO2: 29 mmol/L (ref 20–32)
Calcium: 8.9 mg/dL (ref 8.6–10.4)
Chloride: 95 mmol/L — ABNORMAL LOW (ref 98–110)
Creat: 0.83 mg/dL (ref 0.60–1.00)
Globulin: 2.8 g/dL (ref 1.9–3.7)
Glucose, Bld: 134 mg/dL (ref 65–139)
Potassium: 4.7 mmol/L (ref 3.5–5.3)
Sodium: 133 mmol/L — ABNORMAL LOW (ref 135–146)
Total Bilirubin: 1.4 mg/dL — ABNORMAL HIGH (ref 0.2–1.2)
Total Protein: 6.6 g/dL (ref 6.1–8.1)
eGFR: 72 mL/min/1.73m2 (ref >=60)

## 2024-04-23 MED ORDER — LORATADINE 10 MG PO TABS: 10.0000 mg | ORAL_TABLET | Freq: Every day | ORAL | 6 refills | Status: DC

## 2024-04-23 MED ORDER — FLUTICASONE PROPIONATE 50 MCG/ACT NA SUSP: 2.0000 | Freq: Every day | NASAL | 6 refills | Status: AC | PRN

## 2024-04-23 NOTE — Progress Notes (Unsigned)
 [facility] clinic  Provider:  Jereld Serum DNP  Code Status:  Full Code  Goals of Care:     08/26/2023    1:06 PM  Advanced Directives  Does Patient Have a Medical Advance Directive? Yes  Type of Advance Directive Healthcare Power of Attorney  Does patient want to make changes to medical advance directive? No - Patient declined  Copy of Healthcare Power of Attorney in Chart? Yes - validated most recent copy scanned in chart (See row information)     Chief Complaint  Patient presents with   Cough    Ongoing for about a month. Sore along her rib cage. Some shortness of breath ONLY UPON WHEN SHE IS MOVING A LOT AND COUGHING     Discussed the use of AI scribe software for clinical note transcription with the patient, who gave verbal consent to proceed.   HPI: Patient is a 78 y.o. female seen today for an acute visit for presents with a persistent cough and sinus issues. She is accompanied by her daughter, [name].  She has been experiencing a persistent cough for approximately two and a half months. The cough is mostly dry and is accompanied by clear nasal drainage and sinus pain, particularly on the right side. Movement triggers the cough, which is often followed by sneezing and nasal drainage. The cough has caused soreness in her right chest and back. No fever is present, and the nasal drainage remains clear.  She has a history of obstructive sleep apnea and uses a CPAP machine. She had a period of non-compliance after her husband returned home from rehab but has since resumed regular use. She follows up with a pulmonologist for this condition.  She has a history of pulmonary nodules and follows up with her pulmonologist for monitoring. She also has chronic diastolic heart failure and pulmonary hypertension, for which she takes Lasix  40 mg twice daily, potassium 20 MEQ twice daily, and sildenafil  20 mg three times a day. She experiences occasional shortness of breath, particularly  with exertion, and uses a CPAP machine at night.  She has a history of atrial fibrillation, managed with Eliquis  5 mg twice daily and metoprolol  tartrate 12.5 mg twice daily. She also has hyperthyroidism, following a hemithyroidectomy in 2024, and is currently on methimazole  20 mg daily. She was previously on levothyroxine but was taken off due to overactive thyroid  levels. She has lost 20 pounds since last year.  She reports a history of arthritis and uses Icy Hot and a heating pad for shoulder pain. She also has a pacemaker and wears a hearing aid in her left ear.  She lives with her husband, who has Parkinson's and dementia, and she receives assistance from caregivers. Her daughter, [name], lives eight minutes away and helps with her care.   Past Medical History:  Diagnosis Date   Acute on chronic diastolic CHF (congestive heart failure), NYHA class 1 (HCC) 04/29/2013   Allergic rhinitis    Brady-tachy syndrome (HCC)    s/p STJ dual chamber PPM by Dr Francyne   CAD (coronary artery disease)    70% RCA stenosis, treated medically   Control of atrial fibrillation with pacemaker (HCC)    Diabetes mellitus (HCC)    type 2   DJD (degenerative joint disease)    Dyspnea    occasional   Hearing loss    Right ear only - no hearing aids   Hypertension    Hyperthyroidism    Paroxysmal atrial fibrillation (HCC)  Pacemaker St Jude   Pneumonia    years ago- before 2012 per patient   Polio    as a child   PONV (postoperative nausea and vomiting)    Presence of permanent cardiac pacemaker    St Jude pacemaker   Pulmonary disease    Sleep apnea    uses  CPAP nightly    Past Surgical History:  Procedure Laterality Date   arm surgery d/t fx Right 1996   ATRIAL FIBRILLATION ABLATION  07/31/2012; 08-10-13   PVI by Dr Kelsie   ATRIAL FIBRILLATION ABLATION N/A 07/31/2012   Procedure: ATRIAL FIBRILLATION ABLATION;  Surgeon: Lynwood Kelsie, MD;  Location: [facility] CATH LAB;  Service: Cardiovascular;   Laterality: N/A;   ATRIAL FIBRILLATION ABLATION N/A 08/10/2013   Procedure: ATRIAL FIBRILLATION ABLATION;  Surgeon: Lynwood JONETTA Kelsie, MD;  Location: [facility] CATH LAB;  Service: Cardiovascular;  Laterality: N/A;   BACK SURGERY  07/26/2012   dR. nUDELMAN   BIOPSY  06/30/2023   Procedure: BIOPSY;  Surgeon: Cindie Carlin POUR, DO;  Location: [facility] ENDO [facility];  Service: Endoscopy;;   CARDIAC CATHETERIZATION  12/03/2010   single vessel mid RCA   CARDIAC ELECTROPHYSIOLOGY MAPPING AND ABLATION  07/31/2012   Dr. Lynwood Kelsie   CESAREAN SECTION  09/20/1974   Dr. Lonni   CHOLECYSTECTOMY  1994   COLONOSCOPY WITH PROPOFOL  N/A 03/21/2015   Procedure: COLONOSCOPY WITH PROPOFOL ;  Surgeon: Renaye Sous, MD;  Location: [facility] ENDOSCOPY;  Service: Endoscopy;  Laterality: N/A;   COLONOSCOPY WITH PROPOFOL  N/A 06/30/2023   Procedure: COLONOSCOPY WITH PROPOFOL ;  Surgeon: Cindie Carlin POUR, DO;  Location: [facility];  Service: Endoscopy;  Laterality: N/A;  11:15 AM, ASA 4   DIAGNOSTIC MAMMOGRAM  04/16/2017   Duwaine HERO. Morris, DO   ESOPHAGOGASTRODUODENOSCOPY (EGD) WITH PROPOFOL  N/A 06/30/2023   Procedure: ESOPHAGOGASTRODUODENOSCOPY (EGD) WITH PROPOFOL ;  Surgeon: Cindie Carlin POUR, DO;  Location: [facility];  Service: Endoscopy;  Laterality: N/A;  11:15 AM, ASA 4   EYE SURGERY Bilateral    remove cataracts   LAPAROSCOPIC HYSTERECTOMY  Late 70s to early 80s   LEFT HEART CATH AND CORONARY ANGIOGRAPHY N/A 07/30/2017   Procedure: LEFT HEART CATH AND CORONARY ANGIOGRAPHY;  Surgeon: Burnard Debby LABOR, MD;  Location: [facility] INVASIVE CV LAB;  Service: Cardiovascular;  Laterality: N/A;   LUMBAR LAMINECTOMY/DECOMPRESSION MICRODISCECTOMY Right 12/23/2012   Procedure: Right L5-S1 Laminotomy for resection of synovial cyst;  Surgeon: Lamar LELON Peaches, MD;  Location: [facility] NEURO ORS;  Service: Neurosurgery;  Laterality: Right;  Right Lumbar five-sacral one Laminotomy for resection of synovial cyst   NM MYOCAR PERF WALL MOTION  12/20/2010   normal    PACEMAKER IMPLANT N/A 08/09/2019   Procedure: PACEMAKER IMPLANT;  Surgeon: Francyne Headland, MD;  Location: [facility] INVAS[facility] CV LAB;  Service: Cardiovascular;  Laterality: N/A;   PACEMAKER INSERTION  12/03/2010   SJM Acc[employer] DR RF implanted by Dr Francyne   POLYPECTOMY  06/30/2023   Procedure: POLYPECTOMY;  Surgeon: Cindie Carlin POUR, DO;  Location: [facility];  Service: Endoscopy;;   RIGHT HEART CATH N/A 04/22/2018   Procedure: RIGHT HEART CATH;  Surgeon: Darron Deatrice LABOR, MD;  Location: [facility] INVASIVE CV LAB;  Service: Cardiovascular;  Laterality: N/A;   RIGHT HEART CATH N/A 07/09/2022   Procedure: RIGHT HEART CATH;  Surgeon: Cherrie Toribio SAUNDERS, MD;  Location: [facility];  Service: Cardiovascular;  Laterality: N/A;   TEE WITHOUT CARDIOVERSION N/A 07/30/2012   Procedure: TRANSESOPHAGEAL ECHOCARDIOGRAM (TEE);  Surgeon: Headland  Croitoru, MD;  Location: [facility] ENDOS[facility];  Service: Cardiovascular;  Laterality: N/A;  h&p in file-hope   TEE WITHOUT CARDIOVERSION N/A 08/09/2013   Procedure: TRANSESOPHAGEAL ECHOCARDIOGRAM (TEE);  Surgeon: Redell GORMAN Shallow, MD;  Location: [facility] ENDOSCOPY;  Service: Cardiovascular;  Laterality: N/A;   THYROID  LOBECTOMY Right 09/20/2022   Procedure: RIGHT HEMITHYROIDECTOMY;  Surgeon: Llewellyn Gerard LABOR, DO;  Location: [facility] OR;  Service: ENT;  Laterality: Right;   TOTAL ABDOMINAL HYSTERECTOMY  1981   ULTRASOUND GUIDANCE FOR VASCULAR ACCESS  04/22/2018   Procedure: Ultrasound Guidance For Vascular Access;  Surgeon: Darron Deatrice LABOR, MD;  Location: [facility] INVASIVE CV LAB;  Service: Cardiovascular;;    Allergies  Allergen Reactions   Codeine Nausea And Vomiting and Nausea Only   Ketorolac  Nausea And Vomiting, Nausea Only and Other (See Comments)    pt told to never take this med   Meperidine  Nausea Only and Nausea And Vomiting   Demerol  Nausea And Vomiting   Multaq  [Dronedarone ] Other (See Comments)    Increased fluid retention   Meperidine  Hcl    Xarelto [Rivaroxaban]  Diarrhea   Cyclobenzaprine  Nausea And Vomiting   Latex Rash    Pt stated on 09/20/2022    Outpatient Encounter Medications as of 04/23/2024  Medication Sig   acetaminophen  (TYLENOL ) 500 MG tablet Take 500-1,000 mg by mouth every 6 (six) hours as needed (pain.).   albuterol  (VENTOLIN  HFA) 108 (90 Base) MCG/ACT inhaler Inhale 2 puffs into the lungs every 6 (six) hours as needed for wheezing or shortness of breath.   Cholecalciferol  (VITAMIN D3) 1000 UNITS CAPS Take 1,000 Units by mouth in the morning.   ELIQUIS  5 MG TABS tablet TAKE ONE TABLET BY MOUTH TWICE DAILY   fluticasone  (FLONASE ) 50 MCG/ACT nasal spray Place 2 sprays into both nostrils daily as needed for allergies or rhinitis.   furosemide  (LASIX ) 40 MG tablet Take 1 tablet (40 mg total) by mouth 2 (two) times daily.   loperamide  (IMODIUM  A-D) 2 MG tablet Take 2 mg by mouth. (Patient taking differently: Take 2 mg by mouth as needed.)   loratadine  (CLARITIN ) 10 MG tablet Take 1 tablet (10 mg total) by mouth daily.   methimazole  (TAPAZOLE ) 10 MG tablet Take 20 mg (two tablets) daily.   metoprolol  tartrate (LOPRESSOR ) 25 MG tablet Take 12.5 mg by mouth in the morning and at bedtime.   ondansetron  (ZOFRAN ) 4 MG tablet TAKE ONE TABLET BY MOUTH EVERY EIGHT HOURS AS NEEDED FOR NAUSEA OR VOMITING (Patient taking differently: as needed.)   pantoprazole  (PROTONIX ) 40 MG tablet TAKE ONE TABLET BY MOUTH TWICE DAILY BEFORE A MEAL   potassium chloride  SA (KLOR-CON  M) 20 MEQ tablet TAKE ONE TABLET BY MOUTH TWICE DAILY   sildenafil  (REVATIO ) 20 MG tablet TAKE ONE TABLET BY MOUTH 3 TIMES DAILY   sodium chloride  (NASAL MOISTURIZING SPRAY) 0.65 % nasal spray Place 1 spray into the nose as needed for congestion.   spironolactone  (ALDACTONE ) 25 MG tablet TAKE ONE TABLET BY MOUTH EVERY DAY   tizanidine (ZANAFLEX) 2 MG capsule Take 2 mg by mouth at bedtime.   traMADol  (ULTRAM ) 50 MG tablet Take 50-100 mg by mouth every 8 (eight) hours as needed.   glucose  blood (ONETOUCH ULTRA) test strip USE DAILY TO CHECK BLOOD SUGAR E11.8 (Patient not taking: Reported on 04/23/2024)   Lancets Misc. (ONE TOUCH SURESOFT) MISC Use to test  Blood sugar once daily DX: E11.8, (Patient not taking: Reported on 04/23/2024)   No facility-administered encounter medications on  file as of 04/23/2024.    Review of Systems:  Review of Systems  Constitutional:  Negative for appetite change, chills, fatigue and fever.  HENT:  Negative for congestion, hearing loss, rhinorrhea and sore throat.   Eyes: Negative.   Respiratory:  Positive for cough. Negative for shortness of breath and wheezing.   Cardiovascular:  Negative for chest pain, palpitations and leg swelling.  Gastrointestinal:  Negative for abdominal pain, constipation, diarrhea, nausea and vomiting.  Genitourinary:  Negative for dysuria.  Musculoskeletal:  Negative for arthralgias, back pain and myalgias.  Skin:  Negative for color change, rash and wound.  Neurological:  Negative for dizziness, weakness and headaches.  Psychiatric/Behavioral:  Negative for behavioral problems. The patient is not nervous/anxious.     Health Maintenance  Topic Date Due   Medicare Annual Wellness (AWV)  Never done   Zoster Vaccines- Shingrix (1 of 2) Never done   DTaP/Tdap/Td (2 - Td or Tdap) 03/06/2023   HEMOGLOBIN A1C  11/11/2023   FOOT EXAM  11/13/2023   OPHTHALMOLOGY EXAM  01/03/2024   Diabetic kidney evaluation - Urine ACR  05/12/2024   COVID-19 Vaccine (5 - 2025-26 season) 05/12/2024 (Originally 02/02/2024)   Diabetic kidney evaluation - eGFR measurement  04/23/2025   Pneumococcal Vaccine: 50+ Years  Completed   Influenza Vaccine  Completed   Bone Density Scan  Completed   Hepatitis C Screening  Completed   Meningococcal B Vaccine  Aged Out   Mammogram  Discontinued   Colonoscopy  Discontinued    Physical Exam: Vitals:   04/23/24 1310  BP: 122/62  Pulse: 71  Temp: (!) 97.2 F (36.2 C)  TempSrc: Temporal   SpO2: 97%  Weight: 103 lb 12.8 oz (47.1 kg)  Height: 4' 11 (1.499 m)   Body mass index is 20.97 kg/m. Physical Exam Constitutional:      Appearance: Normal appearance.  HENT:     Head: Normocephalic and atraumatic.     Nose: Nose normal.     Mouth/Throat:     Mouth: Mucous membranes are moist.  Eyes:     Conjunctiva/sclera: Conjunctivae normal.  Cardiovascular:     Rate and Rhythm: Normal rate and regular rhythm.     Comments: Left chest pacemaker Pulmonary:     Effort: Pulmonary effort is normal.     Breath sounds: Normal breath sounds.  Abdominal:     General: Bowel sounds are normal.     Palpations: Abdomen is soft.  Musculoskeletal:        General: Normal range of motion.     Cervical back: Normal range of motion.  Skin:    General: Skin is warm and dry.  Neurological:     General: No focal deficit present.     Mental Status: She is alert and oriented to person, place, and time.  Psychiatric:        Mood and Affect: Mood normal.        Behavior: Behavior normal.        Thought Content: Thought content normal.        Judgment: Judgment normal.     Labs reviewed: Basic Metabolic Panel: Recent Labs    05/12/23 1626 05/13/23 1418 12/04/23 1428 01/12/24 1401 04/23/24 1356  NA 138   < > 130* 134 133*  K 5.0   < > 4.5 4.7 4.7  CL 98   < > 88* 94* 95*  CO2 22   < > 26 25 29   GLUCOSE 82   < >  162* 105* 134  BUN 12   < > 16 15 13   CREATININE 1.18*   < > 1.20* 0.99 0.83  CALCIUM  9.2   < > 10.0 9.2 8.9  TSH 4.650*  --   --   --   --    < > = values in this interval not displayed.   Liver Function Tests: Recent Labs    05/25/23 1030 11/07/23 1611 04/23/24 1356  AST 13* 13 12  ALT 9 9 8   ALKPHOS 92  --   --   BILITOT 1.0 1.3* 1.4*  PROT 6.9 6.5 6.6  ALBUMIN  3.9  --   --    Recent Labs    05/25/23 1030  LIPASE 34   No results for input(s): AMMONIA in the last 8760 hours. CBC: Recent Labs    05/25/23 1030 08/11/23 1514 11/07/23 1611  WBC  5.6 7.5 7.3  NEUTROABS  --  5.6 5,161  HGB 10.6* 10.3* 11.7  HCT 37.9 35.4 40.0  MCV 81.3 80 80.0  PLT 324 353 329   Lipid Panel: No results for input(s): CHOL, HDL, LDLCALC, TRIG, CHOLHDL, LDLDIRECT in the last 8760 hours. Lab Results  Component Value Date   HGBA1C 7.3 (H) 05/13/2023    Procedures since last visit: No results found.  Assessment/Plan  1. Seasonal allergies (Primary) -  Continue Claritin  10 mg daily. - Initiated Flonase  nasal spray daily as needed. - Advised continuous Flonase  use until symptom control, then as needed. - Encouraged [facility] and heating pad for chest soreness. - Advised hydration and vitamin C  supplementation. - loratadine  (CLARITIN ) 10 MG tablet; Take 1 tablet (10 mg total) by mouth daily.  Dispense: 30 tablet; Refill: 6 - fluticasone  (FLONASE ) 50 MCG/ACT nasal spray; Place 2 sprays into both nostrils daily as needed for allergies or rhinitis.  Dispense: 16 g; Refill: 6 - albuterol  (VENTOLIN  HFA) 108 (90 Base) MCG/ACT inhaler; Inhale 2 puffs into the lungs every 6 (six) hours as needed for wheezing or shortness of breath.  Dispense: 8 g; Refill: 1  2. Hyperthyroidism -  managed with methimazole . Recent weight loss noted. Regular endocrinology follow-up. - Continue methimazole  20 mg daily. - Continue endocrinology follow-up    3. Pulmonary nodule -  stable with no changes. - Continue follow-up with pulmonology.  4. OSA (obstructive sleep apnea) -  with restored CPAP compliance. - Continue CPAP therapy.  5. Chronic diastolic heart failure (HCC) -  managed with Lasix  and spironolactone . Blood pressure well-controlled. - Continue Lasix  40 mg twice daily. - Continue spironolactone  25 mg daily. - Comprehensive metabolic panel  7. PAH (pulmonary artery hypertension) (HCC) -  managed with sildenafil , Lasix  and Spironolactone  - Continue sildenafil  20 mg three times daily. -  continue Lasix  40 mg BID -  continue Spironolactone  25  mg daily  8. PAF -  managed with Eliquis  and metoprolol . No recent chest pain or palpitations. - Continue Eliquis  5 mg twice daily. - Continue metoprolol  tartrate 12.5 mg twice daily.     Labs/tests ordered:  CMP   Return in about 6 months (around 10/21/2024).  Xylan Sheils Medina-Vargas, NP

## 2024-04-25 ENCOUNTER — Ambulatory Visit: Payer: Self-pay | Admitting: Adult Health

## 2024-04-25 MED ORDER — ALBUTEROL SULFATE HFA 108 (90 BASE) MCG/ACT IN AERS: 2.0000 | INHALATION_SPRAY | Freq: Four times a day (QID) | RESPIRATORY_TRACT | 1 refills | Status: DC | PRN

## 2024-04-25 NOTE — Progress Notes (Signed)
-     electrolytes normal -  total bilirubin slightly elevated at 1.4, up from 1.3 (normal 0.2 to 1.2) -  will monitor on next follow up

## 2024-04-28 ENCOUNTER — Ambulatory Visit (HOSPITAL_COMMUNITY)
Admission: RE | Admit: 2024-04-28 | Discharge: 2024-04-28 | Disposition: A | Discharge status: Home / Self Care | Source: Ambulatory Visit | Attending: Cardiovascular Disease | Admitting: Cardiovascular Disease

## 2024-04-28 ENCOUNTER — Ambulatory Visit (HOSPITAL_COMMUNITY)

## 2024-04-28 ENCOUNTER — Other Ambulatory Visit: Payer: Self-pay

## 2024-04-28 ENCOUNTER — Ambulatory Visit: Payer: Self-pay | Admitting: Cardiovascular Disease

## 2024-04-28 ENCOUNTER — Encounter: Payer: Self-pay | Admitting: Cardiovascular Disease

## 2024-04-28 DIAGNOSIS — Z7901 Long term (current) use of anticoagulants: Secondary | ICD-10-CM | POA: Diagnosis not present

## 2024-04-28 DIAGNOSIS — R079 Chest pain, unspecified: Secondary | ICD-10-CM | POA: Diagnosis not present

## 2024-04-28 DIAGNOSIS — I4891 Unspecified atrial fibrillation: Secondary | ICD-10-CM | POA: Diagnosis not present

## 2024-04-28 DIAGNOSIS — R339 Retention of urine, unspecified: Secondary | ICD-10-CM | POA: Diagnosis present

## 2024-04-28 DIAGNOSIS — K5900 Constipation, unspecified: Secondary | ICD-10-CM | POA: Diagnosis not present

## 2024-04-28 DIAGNOSIS — I50813 Acute on chronic right heart failure: Secondary | ICD-10-CM | POA: Diagnosis not present

## 2024-04-28 DIAGNOSIS — I5032 Chronic diastolic (congestive) heart failure: Secondary | ICD-10-CM

## 2024-04-28 DIAGNOSIS — I272 Pulmonary hypertension, unspecified: Secondary | ICD-10-CM | POA: Insufficient documentation

## 2024-04-28 DIAGNOSIS — I083 Combined rheumatic disorders of mitral, aortic and tricuspid valves: Secondary | ICD-10-CM | POA: Insufficient documentation

## 2024-04-28 DIAGNOSIS — J918 Pleural effusion in other conditions classified elsewhere: Secondary | ICD-10-CM | POA: Diagnosis present

## 2024-04-28 DIAGNOSIS — Z90710 Acquired absence of both cervix and uterus: Secondary | ICD-10-CM | POA: Diagnosis not present

## 2024-04-28 DIAGNOSIS — Z91040 Latex allergy status: Secondary | ICD-10-CM | POA: Diagnosis not present

## 2024-04-28 DIAGNOSIS — E8809 Other disorders of plasma-protein metabolism, not elsewhere classified: Secondary | ICD-10-CM | POA: Diagnosis present

## 2024-04-28 DIAGNOSIS — I110 Hypertensive heart disease with heart failure: Secondary | ICD-10-CM | POA: Diagnosis present

## 2024-04-28 DIAGNOSIS — Z794 Long term (current) use of insulin: Secondary | ICD-10-CM | POA: Diagnosis not present

## 2024-04-28 DIAGNOSIS — R1110 Vomiting, unspecified: Secondary | ICD-10-CM | POA: Diagnosis not present

## 2024-04-28 DIAGNOSIS — R0602 Shortness of breath: Secondary | ICD-10-CM

## 2024-04-28 DIAGNOSIS — R109 Unspecified abdominal pain: Secondary | ICD-10-CM | POA: Diagnosis not present

## 2024-04-28 DIAGNOSIS — J9601 Acute respiratory failure with hypoxia: Secondary | ICD-10-CM | POA: Diagnosis not present

## 2024-04-28 DIAGNOSIS — R54 Age-related physical debility: Secondary | ICD-10-CM | POA: Diagnosis present

## 2024-04-28 DIAGNOSIS — Z79899 Other long term (current) drug therapy: Secondary | ICD-10-CM | POA: Diagnosis not present

## 2024-04-28 DIAGNOSIS — J90 Pleural effusion, not elsewhere classified: Secondary | ICD-10-CM | POA: Diagnosis not present

## 2024-04-28 DIAGNOSIS — Z950 Presence of cardiac pacemaker: Secondary | ICD-10-CM | POA: Diagnosis not present

## 2024-04-28 DIAGNOSIS — J9811 Atelectasis: Secondary | ICD-10-CM | POA: Diagnosis present

## 2024-04-28 DIAGNOSIS — Z833 Family history of diabetes mellitus: Secondary | ICD-10-CM | POA: Diagnosis not present

## 2024-04-28 DIAGNOSIS — I4821 Permanent atrial fibrillation: Secondary | ICD-10-CM | POA: Diagnosis present

## 2024-04-28 DIAGNOSIS — I5033 Acute on chronic diastolic (congestive) heart failure: Secondary | ICD-10-CM | POA: Diagnosis present

## 2024-04-28 DIAGNOSIS — I2721 Secondary pulmonary arterial hypertension: Secondary | ICD-10-CM

## 2024-04-28 DIAGNOSIS — E1165 Type 2 diabetes mellitus with hyperglycemia: Secondary | ICD-10-CM | POA: Diagnosis present

## 2024-04-28 DIAGNOSIS — I081 Rheumatic disorders of both mitral and tricuspid valves: Secondary | ICD-10-CM | POA: Diagnosis present

## 2024-04-28 DIAGNOSIS — R918 Other nonspecific abnormal finding of lung field: Secondary | ICD-10-CM | POA: Diagnosis not present

## 2024-04-28 DIAGNOSIS — J9859 Other diseases of mediastinum, not elsewhere classified: Secondary | ICD-10-CM | POA: Diagnosis not present

## 2024-04-28 DIAGNOSIS — I2510 Atherosclerotic heart disease of native coronary artery without angina pectoris: Secondary | ICD-10-CM | POA: Diagnosis present

## 2024-04-28 DIAGNOSIS — Z1152 Encounter for screening for COVID-19: Secondary | ICD-10-CM | POA: Diagnosis not present

## 2024-04-28 DIAGNOSIS — E0500 Thyrotoxicosis with diffuse goiter without thyrotoxic crisis or storm: Secondary | ICD-10-CM | POA: Diagnosis present

## 2024-04-28 DIAGNOSIS — I495 Sick sinus syndrome: Secondary | ICD-10-CM | POA: Diagnosis present

## 2024-04-28 DIAGNOSIS — I509 Heart failure, unspecified: Secondary | ICD-10-CM | POA: Insufficient documentation

## 2024-04-28 DIAGNOSIS — Z9049 Acquired absence of other specified parts of digestive tract: Secondary | ICD-10-CM | POA: Diagnosis not present

## 2024-04-28 DIAGNOSIS — Z8249 Family history of ischemic heart disease and other diseases of the circulatory system: Secondary | ICD-10-CM | POA: Diagnosis not present

## 2024-04-28 DIAGNOSIS — R091 Pleurisy: Secondary | ICD-10-CM | POA: Diagnosis not present

## 2024-04-28 DIAGNOSIS — I5082 Biventricular heart failure: Secondary | ICD-10-CM | POA: Diagnosis present

## 2024-04-28 DIAGNOSIS — R000 Tachycardia, unspecified: Secondary | ICD-10-CM | POA: Diagnosis not present

## 2024-04-28 DIAGNOSIS — I480 Paroxysmal atrial fibrillation: Secondary | ICD-10-CM | POA: Diagnosis not present

## 2024-04-28 DIAGNOSIS — E871 Hypo-osmolality and hyponatremia: Secondary | ICD-10-CM | POA: Diagnosis present

## 2024-04-28 LAB — ECHOCARDIOGRAM COMPLETE
Area-P 1/2: 4.86 cm2
S' Lateral: 2.5 cm

## 2024-04-28 MED ORDER — POTASSIUM CHLORIDE CRYS ER 20 MEQ PO TBCR: 20.0000 meq | EXTENDED_RELEASE_TABLET | Freq: Three times a day (TID) | ORAL | 3 refills | Status: DC

## 2024-04-28 MED ORDER — FUROSEMIDE 40 MG PO TABS: 80.0000 mg | ORAL_TABLET | Freq: Two times a day (BID) | ORAL | 3 refills | Status: DC

## 2024-04-28 NOTE — Telephone Encounter (Signed)
 MyChart message sent to patient. RX's sent to pharmacy and labs ordered. F/U visit scheduled for 12/12 at 0920

## 2024-04-28 NOTE — Telephone Encounter (Signed)
 Can we please also order a proBNP?  If I am reading it right she is coming to the [facility] outpatient echo lab to have the echo done today at 3.  She can come to our office and have the proBNP drawn just before that.  Or she can have it done in /[place] [facility].

## 2024-04-29 ENCOUNTER — Ambulatory Visit: Payer: Self-pay | Admitting: Cardiovascular Disease

## 2024-04-29 LAB — PRO B NATRIURETIC PEPTIDE: NT-Pro BNP: 2352 pg/mL — ABNORMAL HIGH (ref 0–738)

## 2024-04-30 ENCOUNTER — Emergency Department (HOSPITAL_COMMUNITY)

## 2024-04-30 ENCOUNTER — Telehealth: Payer: Self-pay | Admitting: Physician Assistant

## 2024-04-30 ENCOUNTER — Other Ambulatory Visit: Payer: Self-pay

## 2024-04-30 ENCOUNTER — Inpatient Hospital Stay (HOSPITAL_COMMUNITY)

## 2024-04-30 ENCOUNTER — Inpatient Hospital Stay (HOSPITAL_COMMUNITY)
Admission: EM | Admit: 2024-04-30 | Discharge: 2024-05-06 | DRG: 286 | Disposition: A | Attending: Internal Medicine | Admitting: Internal Medicine

## 2024-04-30 DIAGNOSIS — J9859 Other diseases of mediastinum, not elsewhere classified: Secondary | ICD-10-CM | POA: Diagnosis not present

## 2024-04-30 DIAGNOSIS — I4821 Permanent atrial fibrillation: Secondary | ICD-10-CM | POA: Diagnosis present

## 2024-04-30 DIAGNOSIS — E871 Hypo-osmolality and hyponatremia: Secondary | ICD-10-CM | POA: Diagnosis not present

## 2024-04-30 DIAGNOSIS — I495 Sick sinus syndrome: Secondary | ICD-10-CM | POA: Diagnosis not present

## 2024-04-30 DIAGNOSIS — J90 Pleural effusion, not elsewhere classified: Principal | ICD-10-CM

## 2024-04-30 DIAGNOSIS — I509 Heart failure, unspecified: Secondary | ICD-10-CM | POA: Diagnosis not present

## 2024-04-30 DIAGNOSIS — J918 Pleural effusion in other conditions classified elsewhere: Secondary | ICD-10-CM | POA: Diagnosis not present

## 2024-04-30 DIAGNOSIS — I272 Pulmonary hypertension, unspecified: Secondary | ICD-10-CM

## 2024-04-30 DIAGNOSIS — E8809 Other disorders of plasma-protein metabolism, not elsewhere classified: Secondary | ICD-10-CM | POA: Diagnosis not present

## 2024-04-30 DIAGNOSIS — J9811 Atelectasis: Secondary | ICD-10-CM | POA: Diagnosis not present

## 2024-04-30 DIAGNOSIS — I2721 Secondary pulmonary arterial hypertension: Secondary | ICD-10-CM | POA: Diagnosis present

## 2024-04-30 DIAGNOSIS — I110 Hypertensive heart disease with heart failure: Secondary | ICD-10-CM | POA: Diagnosis not present

## 2024-04-30 DIAGNOSIS — R7689 Other specified abnormal immunological findings in serum: Secondary | ICD-10-CM

## 2024-04-30 DIAGNOSIS — I5033 Acute on chronic diastolic (congestive) heart failure: Secondary | ICD-10-CM | POA: Diagnosis not present

## 2024-04-30 DIAGNOSIS — E0500 Thyrotoxicosis with diffuse goiter without thyrotoxic crisis or storm: Secondary | ICD-10-CM | POA: Diagnosis not present

## 2024-04-30 DIAGNOSIS — I2510 Atherosclerotic heart disease of native coronary artery without angina pectoris: Secondary | ICD-10-CM

## 2024-04-30 DIAGNOSIS — R339 Retention of urine, unspecified: Secondary | ICD-10-CM

## 2024-04-30 DIAGNOSIS — E1165 Type 2 diabetes mellitus with hyperglycemia: Secondary | ICD-10-CM | POA: Diagnosis not present

## 2024-04-30 DIAGNOSIS — R0602 Shortness of breath: Secondary | ICD-10-CM

## 2024-04-30 HISTORY — PX: IR THORACENTESIS ASP PLEURAL SPACE W/IMG GUIDE: IMG5380

## 2024-04-30 LAB — CBC
HCT: 41.9 % (ref 36.0–46.0)
HCT: 42.9 % (ref 36.0–46.0)
Hemoglobin: 12.8 g/dL (ref 12.0–15.0)
Hemoglobin: 13.4 g/dL (ref 12.0–15.0)
MCH: 25.7 pg — ABNORMAL LOW (ref 26.0–34.0)
MCH: 25.7 pg — ABNORMAL LOW (ref 26.0–34.0)
MCHC: 30.5 g/dL (ref 30.0–36.0)
MCHC: 31.2 g/dL (ref 30.0–36.0)
MCV: 84 fL (ref 80.0–100.0)
MCV: 82.3 fL (ref 80.0–100.0)
Platelets: 432 K/uL — ABNORMAL HIGH (ref 150–400)
Platelets: 415 K/uL — ABNORMAL HIGH (ref 150–400)
RBC: 4.99 MIL/uL (ref 3.87–5.11)
RBC: 5.21 MIL/uL — ABNORMAL HIGH (ref 3.87–5.11)
RDW: 15.9 % — ABNORMAL HIGH (ref 11.5–15.5)
RDW: 15.6 % — ABNORMAL HIGH (ref 11.5–15.5)
WBC: 9.3 K/uL (ref 4.0–10.5)
WBC: 17.2 K/uL — ABNORMAL HIGH (ref 4.0–10.5)
nRBC: 0 % (ref 0.0–0.2)
nRBC: 0 % (ref 0.0–0.2)

## 2024-04-30 LAB — BODY FLUID CELL COUNT WITH DIFFERENTIAL
Eos, Fluid: 1 %
Lymphs, Fluid: 5 %
Monocyte-Macrophage-Serous Fluid: 36 % — ABNORMAL LOW (ref 50–90)
Neutrophil Count, Fluid: 58 % — ABNORMAL HIGH (ref 0–25)
Total Nucleated Cell Count, Fluid: 738 uL (ref 0–1000)

## 2024-04-30 LAB — BASIC METABOLIC PANEL WITH GFR
Anion gap: 12 (ref 5–15)
BUN: 13 mg/dL (ref 8–23)
CO2: 28 mmol/L (ref 22–32)
Calcium: 9.2 mg/dL (ref 8.9–10.3)
Chloride: 91 mmol/L — ABNORMAL LOW (ref 98–111)
Creatinine, Ser: 1 mg/dL (ref 0.44–1.00)
GFR, Estimated: 57 mL/min — ABNORMAL LOW (ref >60)
Glucose, Bld: 132 mg/dL — ABNORMAL HIGH (ref 70–99)
Potassium: 4.3 mmol/L (ref 3.5–5.1)
Sodium: 131 mmol/L — ABNORMAL LOW (ref 135–145)

## 2024-04-30 LAB — RESP PANEL BY RT-PCR (RSV, FLU A&B, COVID)  RVPGX2
Influenza A by PCR: NEGATIVE
Influenza B by PCR: NEGATIVE
Resp Syncytial Virus by PCR: NEGATIVE
SARS Coronavirus 2 by RT PCR: NEGATIVE

## 2024-04-30 LAB — URINALYSIS, ROUTINE W REFLEX MICROSCOPIC
Bilirubin Urine: NEGATIVE
Glucose, UA: NEGATIVE mg/dL
Ketones, ur: NEGATIVE mg/dL
Leukocytes,Ua: NEGATIVE
Nitrite: NEGATIVE
Protein, ur: NEGATIVE mg/dL
Specific Gravity, Urine: 1.009 (ref 1.005–1.030)
pH: 5 (ref 5.0–8.0)

## 2024-04-30 LAB — TROPONIN I (HIGH SENSITIVITY)
Troponin I (High Sensitivity): 7 ng/L (ref <18)
Troponin I (High Sensitivity): 7 ng/L (ref <18)

## 2024-04-30 LAB — GRAM STAIN

## 2024-04-30 LAB — GLUCOSE, PLEURAL OR PERITONEAL FLUID: Glucose, Fluid: 135 mg/dL

## 2024-04-30 LAB — HEPARIN LEVEL (UNFRACTIONATED): Heparin Unfractionated: >1.1 [IU]/mL — ABNORMAL HIGH (ref 0.30–0.70)

## 2024-04-30 LAB — BRAIN NATRIURETIC PEPTIDE: B Natriuretic Peptide: 151.2 pg/mL — ABNORMAL HIGH (ref 0.0–100.0)

## 2024-04-30 LAB — CREATININE, SERUM
Creatinine, Ser: 0.98 mg/dL (ref 0.44–1.00)
GFR, Estimated: 59 mL/min — ABNORMAL LOW (ref >60)

## 2024-04-30 LAB — APTT: aPTT: 36 s (ref 24–36)

## 2024-04-30 MED ORDER — CHLORHEXIDINE GLUCONATE CLOTH 2 % EX PADS
6.0000 | MEDICATED_PAD | Freq: Every day | CUTANEOUS | Status: DC
  Administered 2024-04-30 – 2024-05-06 (×7): 6 via TOPICAL

## 2024-04-30 MED ORDER — PANTOPRAZOLE SODIUM 40 MG PO TBEC
40.0000 mg | DELAYED_RELEASE_TABLET | Freq: Every day | ORAL | Status: DC
  Administered 2024-05-01 – 2024-05-06 (×7): 40 mg via ORAL
  Filled 2024-04-30 (×7): qty 1

## 2024-04-30 MED ORDER — FUROSEMIDE 10 MG/ML IJ SOLN: 40.0000 mg | Freq: Once | INTRAMUSCULAR | Status: DC

## 2024-04-30 MED ORDER — HEPARIN (PORCINE) 25000 UT/250ML-% IV SOLN: 650.0000 [IU]/h | INTRAVENOUS | Status: DC

## 2024-04-30 MED ORDER — FUROSEMIDE 10 MG/ML IJ SOLN
80.0000 mg | Freq: Once | INTRAMUSCULAR | Status: AC
  Administered 2024-04-30: 80 mg via INTRAVENOUS
  Filled 2024-04-30: qty 8

## 2024-04-30 MED ORDER — SILDENAFIL CITRATE 20 MG PO TABS
20.0000 mg | ORAL_TABLET | Freq: Three times a day (TID) | ORAL | Status: DC
  Administered 2024-05-01 – 2024-05-03 (×8): 20 mg via ORAL
  Filled 2024-04-30 (×11): qty 1

## 2024-04-30 MED ORDER — FUROSEMIDE 10 MG/ML IJ SOLN
40.0000 mg | Freq: Two times a day (BID) | INTRAMUSCULAR | Status: DC
  Administered 2024-05-01 (×2): 40 mg via INTRAVENOUS
  Filled 2024-04-30 (×2): qty 4

## 2024-04-30 MED ORDER — ENOXAPARIN SODIUM 40 MG/0.4ML IJ SOSY: 40.0000 mg | PREFILLED_SYRINGE | INTRAMUSCULAR | Status: DC

## 2024-04-30 MED ORDER — LIDOCAINE-EPINEPHRINE 1 %-1:100000 IJ SOLN
INTRAMUSCULAR | Status: AC
  Filled 2024-04-30: qty 1

## 2024-04-30 MED ORDER — ENSURE PLUS HIGH PROTEIN PO LIQD
237.0000 mL | Freq: Two times a day (BID) | ORAL | Status: DC
  Administered 2024-05-01 – 2024-05-04 (×6): 237 mL via ORAL

## 2024-04-30 MED ORDER — METHIMAZOLE 5 MG PO TABS
20.0000 mg | ORAL_TABLET | Freq: Every day | ORAL | Status: DC
  Administered 2024-05-01 – 2024-05-06 (×7): 20 mg via ORAL
  Filled 2024-04-30: qty 2
  Filled 2024-04-30: qty 4
  Filled 2024-04-30: qty 2
  Filled 2024-04-30: qty 4
  Filled 2024-04-30: qty 2
  Filled 2024-04-30 (×2): qty 4
  Filled 2024-04-30 (×2): qty 2

## 2024-04-30 MED ORDER — LIDOCAINE-EPINEPHRINE 1 %-1:100000 IJ SOLN
20.0000 mL | Freq: Once | INTRAMUSCULAR | Status: AC
  Administered 2024-05-03: 20 mL via INTRADERMAL

## 2024-04-30 MED ORDER — ONDANSETRON HCL 4 MG/2ML IJ SOLN
4.0000 mg | Freq: Once | INTRAMUSCULAR | Status: AC | PRN
  Administered 2024-04-30: 4 mg via INTRAVENOUS
  Filled 2024-04-30: qty 2

## 2024-04-30 MED ORDER — HEPARIN (PORCINE) 25000 UT/250ML-% IV SOLN
800.0000 [IU]/h | INTRAVENOUS | Status: DC
  Administered 2024-04-30: 650 [IU]/h via INTRAVENOUS
  Filled 2024-04-30 (×2): qty 250

## 2024-04-30 NOTE — ED Triage Notes (Signed)
 Patient with hx of afib and CHF endorses constant SOB x 3 days. Reports recent increase in Lasix  dosage d/t same but has not been able to empty her bladder since yesterday morning. Has urge to urinate but is able to urinate only a little at a time.

## 2024-04-30 NOTE — Consult Note (Addendum)
 Cardiology Consultation   Patient ID: Hailey [name] MRN: [id number]; DOB: 03/20/1946  Admit date: 04/30/2024 Date of Consult: 04/30/2024  PCP:  [name], NP   [facility] Providers Cardiologist:  Jerel Balding, MD        Patient Profile: Hailey [name] is a 78 y.o. female with a history of nonobstructive CAD on cardiac catheterization in 07/2017, chronic HFpEF, permanent atrial fibrillation s/p ablation in 07/2012 and repeat ablation in 08/2013, tachybradycardia syndrome with 2-1 AV block s/p PPM in 2016 with subsequent generator change and new RV lead for high pacing thresholds in 08/2019, multifactorial pulmonary or hypertension, hypertension, type 2 diabetes mellitus, large thyroid  goiter with hyperthyroidism s/p right hemithyroidectomy in 09/2022, obstructive sleep apnea on CPAP, and polio as a child in who is being seen 04/30/2024 for further evaluation of shortness of breath at the request of Dr. Elnor.  History of Present Illness: Hailey [name] is a 78 year old female with the above history who is followed by Dr. Balding. She is followed primarily for chronic CHF, pulmonary hypertension, atrial fibrillation, and tachy-brady syndrome.  LHC in 07/2017 showed mild nonobstructive CAD.  Prior RHC showed that her pulmonary hypertension could not be explained exclusively based on elevated left heart filling pressures. She has been seen by Rheumatology - ANA was positive but overall work-up was negative for lupus, rheumatoid arthritis, or other connective tissue disease. Most recent RHC in 07/2022 showed findings consistent with mixed moderate pulmonary venous and pulmonary arterial hypertension with elevated filling pressures and low cardiac output (Fick cardiac output 3.2 and index 2.1).  Options were felt to be limited and she was not a candidate for selective pulmonary arterial vasodilators.  She was last seen by Dr. Balding on 04/05/2024  at which time her functional  status assessment was difficult but at worst felt to be NYHA functional class II. Repeat Echo was ordered and showed LVEF of 60-65% with normal wall motion and diastolic parameters, severely enlarged RV with normal RV function and severely elevated PASP (81.6 mmHg), mild MR, and severe TR. Pro-BNP was elevated at 2,352.  Lasix  was increased.  Patient called our Answering Service earlier today with concerns about worsening shortness of breath. She was advised to come to the ED for further evaluation.   Upon arrival to the ED, BP mildly elevated. EKG showed V paced rhythm with no acute acute ischemic changes.  High-sensitivity troponin negative.  BNP mildly elevated at 151.2.  Chest x-ray showed a new large right pleural effusion with right lung compressive atelectasis and stable mediastinal mass consistent with known goiter. WBC 9.3, Hgb 12.8, Plts 432. Na 131, K 4.3, Cl 91, Glucose 132, BUN 13, Cr 1.00. Respiratory panel negative for COVID, influenza A/B, and RSV. She was given a dose of IV Lasix  and Cardiology was consulted.   Patient has chronic dyspnea both at rest and with exertion but she states her symptoms really started to worsen after her PO Lasix  was recently increased.  She typically sleeps on a wedge as well as 2 pillows but has had to sleep on 3 pillows recently.  She also reports occasionally waking up in the middle of the night feeling short of breath but this is not new.  She reports some atypical right-sided chest soreness that is reproducible with palpation and she has had for the last couple weeks since dealing with URI symptoms.  However, she denies any chest pain that sounds like angina.  She has permanent atrial fibrillation and does  have some palpitations with this.  She feels like this has been a little worse since Lasix  was increased.  No lightheadedness, dizziness, or syncope.  She reports URI symptoms with nasal congestion and cough for the last couple months.  She states her PCP has  been treating this conservatively she has not required any antibiotics.  No fevers.  No abnormal bleeding or stools.  She does describe some urinary retention over the last couple of days even with the increase of Lasix .  Bladder scan showed she was retaining 302 mL in the ED - she now has a catheter in place.  Past Medical History:  Diagnosis Date   Acute on chronic diastolic CHF (congestive heart failure), NYHA class 1 (HCC) 04/29/2013   Allergic rhinitis    Brady-tachy syndrome (HCC)    s/p STJ dual chamber PPM by Dr Francyne   CAD (coronary artery disease)    70% RCA stenosis, treated medically   Control of atrial fibrillation with pacemaker (HCC)    Diabetes mellitus (HCC)    type 2   DJD (degenerative joint disease)    Dyspnea    occasional   Hearing loss    Right ear only - no hearing aids   Hypertension    Hyperthyroidism    Paroxysmal atrial fibrillation (HCC)    Pacemaker St Jude   Pneumonia    years ago- before 2012 per patient   Polio    as a child   PONV (postoperative nausea and vomiting)    Presence of permanent cardiac pacemaker    St Jude pacemaker   Pulmonary disease    Sleep apnea    uses  CPAP nightly    Past Surgical History:  Procedure Laterality Date   arm surgery d/t fx Right 1996   ATRIAL FIBRILLATION ABLATION  07/31/2012; 08-10-13   PVI by Dr Kelsie   ATRIAL FIBRILLATION ABLATION N/A 07/31/2012   Procedure: ATRIAL FIBRILLATION ABLATION;  Surgeon: Lynwood Kelsie, MD;  Location: [facility] CATH LAB;  Service: Cardiovascular;  Laterality: N/A;   ATRIAL FIBRILLATION ABLATION N/A 08/10/2013   Procedure: ATRIAL FIBRILLATION ABLATION;  Surgeon: Lynwood JONETTA Kelsie, MD;  Location: [facility] CATH LAB;  Service: Cardiovascular;  Laterality: N/A;   BACK SURGERY  07/26/2012   dR. nUDELMAN   BIOPSY  06/30/2023   Procedure: BIOPSY;  Surgeon: Cindie Carlin POUR, DO;  Location: [facility] ENDO SUITE;  Service: Endoscopy;;   CARDIAC CATHETERIZATION  12/03/2010   single vessel mid RCA    CARDIAC ELECTROPHYSIOLOGY MAPPING AND ABLATION  07/31/2012   Dr. Lynwood Kelsie   CESAREAN SECTION  09/20/1974   Dr. Lonni   CHOLECYSTECTOMY  1994   COLONOSCOPY WITH PROPOFOL  N/A 03/21/2015   Procedure: COLONOSCOPY WITH PROPOFOL ;  Surgeon: Renaye Sous, MD;  Location: [facility] ENDOSCOPY;  Service: Endoscopy;  Laterality: N/A;   COLONOSCOPY WITH PROPOFOL  N/A 06/30/2023   Procedure: COLONOSCOPY WITH PROPOFOL ;  Surgeon: Cindie Carlin POUR, DO;  Location: [facility] ENDO SUITE;  Service: Endoscopy;  Laterality: N/A;  11:15 AM, ASA 4   DIAGNOSTIC MAMMOGRAM  04/16/2017   Duwaine HERO. Morris, DO   ESOPHAGOGASTRODUODENOSCOPY (EGD) WITH PROPOFOL  N/A 06/30/2023   Procedure: ESOPHAGOGASTRODUODENOSCOPY (EGD) WITH PROPOFOL ;  Surgeon: Cindie Carlin POUR, DO;  Location: [facility] ENDO SUITE;  Service: Endoscopy;  Laterality: N/A;  11:15 AM, ASA 4   EYE SURGERY Bilateral    remove cataracts   LAPAROSCOPIC HYSTERECTOMY  Late 70s to early 80s   LEFT HEART CATH AND CORONARY ANGIOGRAPHY N/A 07/30/2017   Procedure: LEFT  HEART CATH AND CORONARY ANGIOGRAPHY;  Surgeon: Burnard Debby LABOR, MD;  Location: [facility] INVASIVE CV LAB;  Service: Cardiovascular;  Laterality: N/A;   LUMBAR LAMINECTOMY/DECOMPRESSION MICRODISCECTOMY Right 12/23/2012   Procedure: Right L5-S1 Laminotomy for resection of synovial cyst;  Surgeon: Lamar LELON Peaches, MD;  Location: [facility] NEURO ORS;  Service: Neurosurgery;  Laterality: Right;  Right Lumbar five-sacral one Laminotomy for resection of synovial cyst   NM MYOCAR PERF WALL MOTION  12/20/2010   normal   PACEMAKER IMPLANT N/A 08/09/2019   Procedure: PACEMAKER IMPLANT;  Surgeon: Francyne Headland, MD;  Location: [facility] INVASIVE CV LAB;  Service: Cardiovascular;  Laterality: N/A;   PACEMAKER INSERTION  12/03/2010   SJM Accent DR RF implanted by Dr Francyne   POLYPECTOMY  06/30/2023   Procedure: POLYPECTOMY;  Surgeon: Cindie Carlin POUR, DO;  Location: [facility] ENDO SUITE;  Service: Endoscopy;;   RIGHT HEART CATH N/A 04/22/2018   Procedure:  RIGHT HEART CATH;  Surgeon: Darron Deatrice LABOR, MD;  Location: [facility] INVASIVE CV LAB;  Service: Cardiovascular;  Laterality: N/A;   RIGHT HEART CATH N/A 07/09/2022   Procedure: RIGHT HEART CATH;  Surgeon: Cherrie Toribio SAUNDERS, MD;  Location: [facility] INVASIVE CV LAB;  Service: Cardiovascular;  Laterality: N/A;   TEE WITHOUT CARDIOVERSION N/A 07/30/2012   Procedure: TRANSESOPHAGEAL ECHOCARDIOGRAM (TEE);  Surgeon: Headland Francyne, MD;  Location: [facility] ENDOSCOPY;  Service: Cardiovascular;  Laterality: N/A;  h&p in file-hope   TEE WITHOUT CARDIOVERSION N/A 08/09/2013   Procedure: TRANSESOPHAGEAL ECHOCARDIOGRAM (TEE);  Surgeon: Redell GORMAN Shallow, MD;  Location: [facility] ENDOSCOPY;  Service: Cardiovascular;  Laterality: N/A;   THYROID  LOBECTOMY Right 09/20/2022   Procedure: RIGHT HEMITHYROIDECTOMY;  Surgeon: Llewellyn Gerard LABOR, DO;  Location: [facility] OR;  Service: ENT;  Laterality: Right;   TOTAL ABDOMINAL HYSTERECTOMY  1981   ULTRASOUND GUIDANCE FOR VASCULAR ACCESS  04/22/2018   Procedure: Ultrasound Guidance For Vascular Access;  Surgeon: Darron Deatrice LABOR, MD;  Location: [facility] INVASIVE CV LAB;  Service: Cardiovascular;;     Home Medications:  Prior to Admission medications   Medication Sig Start Date End Date Taking? Authorizing Provider  acetaminophen  (TYLENOL ) 500 MG tablet Take 500-1,000 mg by mouth every 6 (six) hours as needed (pain.).   Yes [provider]  albuterol  (VENTOLIN  HFA) 108 (90 Base) MCG/ACT inhaler Inhale 2 puffs into the lungs every 6 (six) hours as needed for wheezing or shortness of breath. 04/25/24  Yes Medina-Vargas, Monina C, NP  Cholecalciferol  (VITAMIN D3) 1000 UNITS CAPS Take 1,000 Units by mouth in the morning.   Yes [provider]  ELIQUIS  5 MG TABS tablet TAKE ONE TABLET BY MOUTH TWICE DAILY 12/31/23  Yes Croitoru, Mihai, MD  fluticasone  (FLONASE ) 50 MCG/ACT nasal spray Place 2 sprays into both nostrils daily as needed for allergies or rhinitis. 04/23/24  Yes Medina-Vargas,  Monina C, NP  furosemide  (LASIX ) 40 MG tablet Take 2 tablets (80 mg total) by mouth 2 (two) times daily. 04/28/24  Yes Croitoru, Mihai, MD  loperamide  (IMODIUM  A-D) 2 MG tablet Take 2 mg by mouth. Patient taking differently: Take 2 mg by mouth as needed for diarrhea or loose stools. 02/20/17  Yes [provider]  loratadine  (CLARITIN ) 10 MG tablet Take 1 tablet (10 mg total) by mouth daily. 04/23/24  Yes Medina-Vargas, Monina C, NP  methimazole  (TAPAZOLE ) 10 MG tablet Take 20 mg (two tablets) daily. 12/31/23  Yes [provider]  metoprolol  tartrate (LOPRESSOR ) 25 MG tablet Take 12.5 mg by mouth in the morning and at  bedtime.   Yes [provider]  ondansetron  (ZOFRAN ) 4 MG tablet TAKE ONE TABLET BY MOUTH EVERY EIGHT HOURS AS NEEDED FOR NAUSEA OR VOMITING 12/18/23  Yes Sherlynn Madden, MD  pantoprazole  (PROTONIX ) 40 MG tablet TAKE ONE TABLET BY MOUTH TWICE DAILY BEFORE A MEAL 12/02/23  Yes Rudy, Kristen S, PA-C  potassium chloride  SA (KLOR-CON  M) 20 MEQ tablet Take 1 tablet (20 mEq total) by mouth 3 (three) times daily. 04/28/24  Yes Croitoru, Mihai, MD  sildenafil  (REVATIO ) 20 MG tablet TAKE ONE TABLET BY MOUTH 3 TIMES DAILY 11/26/23  Yes Croitoru, Mihai, MD  sodium chloride  (NASAL MOISTURIZING SPRAY) 0.65 % nasal spray Place 1 spray into the nose as needed for congestion.   Yes [provider]  spironolactone  (ALDACTONE ) 25 MG tablet TAKE ONE TABLET BY MOUTH EVERY DAY 03/08/24  Yes West, Katlyn D, NP  tizanidine (ZANAFLEX) 2 MG capsule Take 2 mg by mouth at bedtime.   Yes [provider]  traMADol  (ULTRAM ) 50 MG tablet Take 50-100 mg by mouth every 8 (eight) hours as needed for moderate pain (pain score 4-6).   Yes [provider]    Scheduled Meds:  furosemide   80 mg Intravenous Once   Continuous Infusions:  PRN Meds:   Allergies:    Allergies  Allergen Reactions   Codeine Nausea And Vomiting and Nausea Only   Ketorolac  Nausea And  Vomiting, Nausea Only and Other (See Comments)    pt told to never take this med   Meperidine  Nausea Only and Nausea And Vomiting   Multaq  [Dronedarone ] Other (See Comments)    Increased fluid retention   Xarelto [Rivaroxaban] Diarrhea   Flexeril  [Cyclobenzaprine ] Nausea And Vomiting   Latex Rash    Pt stated on 09/20/2022    Social History:   Social History   Socioeconomic History   Marital status: Married    Spouse name: Aleene   Number of children: 3   Years of education: 13   Highest education level: Some college, no degree  Occupational History   Occupation: housewife  Tobacco Use   Smoking status: Never   Smokeless tobacco: Never  Vaping Use   Vaping status: Never Used  Substance and Sexual Activity   Alcohol use: No   Drug use: No   Sexual activity: Not Currently    Birth control/protection: Abstinence, Surgical    Comment: Hysterectomy  Other Topics Concern   Not on file  Social History Narrative   Lives in [place].  Retired.  Was office clerk in several companies.  Did some technical school after completing high school.  She has no pets.  She and her husband live in a one-story home.  They've been married since 1965.  She follows a heart healthy diet.  She has a living will and hcpoa but does not have a DNR form.  She does struggle to prepare food.     Social Drivers of Corporate Investment Banker Strain: Low Risk (11/03/2023)   Received from [facility]   Overall Financial Resource Strain (CARDIA)    Difficulty of Paying Living Expenses: Not hard at all  Food Insecurity: Low Risk  (11/17/2023)   Received from [facility]   Hunger Vital Sign    Within the past 12 months, you worried that your food would run out before you got money to buy more: Never true    Within the past 12 months, the food you bought just didn't last and you didn't  have money to get more. : Never true  Transportation Needs: No Transportation Needs (11/17/2023)   Received from  [facility]    In the past 12 months, has lack of reliable transportation kept you from medical appointments, meetings, work or from getting things needed for daily living? : No  Physical Activity: Not on file  Stress: Not on file  Social Connections: Not on file  Intimate Partner Violence: Not At Risk (09/20/2022)   Humiliation, Afraid, Rape, and Kick questionnaire    Fear of Current or Ex-Partner: No    Emotionally Abused: No    Physically Abused: No    Sexually Abused: No    Family History:   Family History  Problem Relation Age of Onset   Cancer Brother    Heart attack Sister    Breast cancer Sister    Diabetes Sister    Heart disease Sister    Breast cancer Sister    Dementia Sister    Colon cancer Sister    Heart disease Sister    Breast cancer Sister    Suicidality Brother    Prostate cancer Brother    Dementia Brother      ROS:  Please see the history of present illness.    Physical Exam/Data: Vitals:   04/30/24 1048 04/30/24 1145 04/30/24 1200 04/30/24 1230  BP:  (!) 129/55 (!) 134/57 (!) 144/68  Pulse:  63 65 65  Resp:  (!) 30 (!) 26 (!) 25  Temp:      SpO2:  97% 96% 100%  Weight: 46.7 kg     Height: 4' 11 (1.499 m)      No intake or output data in the 24 hours ending 04/30/24 1358    04/30/2024   10:48 AM 04/23/2024    1:10 PM 04/14/2024    3:35 PM  Last 3 Weights  Weight (lbs) 103 lb 103 lb 12.8 oz 105 lb  Weight (kg) 46.72 kg 47.083 kg 47.628 kg     Body mass index is 20.8 kg/m.  General: 78 y.o. thin Caucasian female resting comfortably in no acute distress. HEENT: Normocephalic and atraumatic. Sclera clear. EOMs intact. Neck: Supple. No JVD. Heart: RRR. Soft I-II/VI systolic murmur No murmurs, gallops, or rubs.  Lungs: No increased work of breathing. Absent breath sounds on the right consistent with pleural effusion.  No wheezes, rhonchi, or rales.  Abdomen: Soft and non-distender but right upper quadrant tender to  palpation.  Extremities: No lower extremity edema.    Skin: Warm and dry. Neuro: Alert and oriented x3. No focal deficits. Psych: Normal affect. Responds appropriately.  EKG:  The EKG was personally reviewed and demonstrates:  V-paced rhythm with underlying atrial fibrillation. Telemetry:  Telemetry was personally reviewed and demonstrates:  V paced rhythm with underlying atrial fibrillation. Rates in the 60s.  Relevant CV Studies:  Echocardiogram 04/28/2024: Impressions: 1. Left ventricular ejection fraction, by estimation, is 60 to 65%. The  left ventricle has normal function. The left ventricle has no regional  wall motion abnormalities. Left ventricular diastolic parameters were  normal.   2. Right ventricular systolic function is normal. The right ventricular  size is severely enlarged. There is severely elevated pulmonary artery  systolic pressure. The estimated right ventricular systolic pressure is  81.6 mmHg.   3. Left atrial size was mildly dilated.   4. The mitral valve is degenerative. Mild mitral valve regurgitation.   5. The tricuspid valve is abnormal. Tricuspid valve regurgitation is  severe.   6. The aortic valve is tricuspid. Aortic valve regurgitation is mild. No  aortic stenosis is present.   7. The inferior vena cava is normal in size with <50% respiratory  variability, suggesting right atrial pressure of 8 mmHg.   Comparison(s): Changes from prior study are noted. No severely elevated  pulmonary pressures and severely dilated RV.   Laboratory Data: High Sensitivity Troponin:   Recent Labs  Lab 04/30/24 1154  TROPONINIHS 7     Chemistry Recent Labs  Lab 04/30/24 1154  NA 131*  K 4.3  CL 91*  CO2 28  GLUCOSE 132*  BUN 13  CREATININE 1.00  CALCIUM  9.2  GFRNONAA 57*  ANIONGAP 12    No results for input(s): PROT, ALBUMIN , AST, ALT, ALKPHOS, BILITOT in the last 168 hours. Lipids No results for input(s): CHOL, TRIG, HDL,  LABVLDL, LDLCALC, CHOLHDL in the last 168 hours.  Hematology Recent Labs  Lab 04/30/24 1154  WBC 9.3  RBC 4.99  HGB 12.8  HCT 41.9  MCV 84.0  MCH 25.7*  MCHC 30.5  RDW 15.9*  PLT 432*   Thyroid  No results for input(s): TSH, FREET4 in the last 168 hours.  BNP Recent Labs  Lab 04/28/24 1548 04/30/24 1155  BNP  --  151.2*  PROBNP 2,352*  --     DDimer No results for input(s): DDIMER in the last 168 hours.  Radiology/Studies:  DG Chest 2 View Result Date: 04/30/2024 CLINICAL DATA:  Shortness of breath. Atrial fibrillation. Congestive heart failure. EXAM: CHEST - 2 VIEW COMPARISON:  11/07/2023 FINDINGS: New large right pleural effusion is seen, with right lung compressive atelectasis. Left lung remains clear. Heart size is stable. Pacemaker again noted. Stable mediastinal mass causing tracheal deviation to the right, consistent with known goiter as demonstrated on prior neck CT. IMPRESSION: New large right pleural effusion, with right lung compressive atelectasis. Stable mediastinal mass, consistent with known goiter. Electronically Signed   By: Norleen DELENA Kil M.D.   On: 04/30/2024 11:45   ECHOCARDIOGRAM COMPLETE Result Date: 04/28/2024    ECHOCARDIOGRAM REPORT   Patient Name:   ANIJAH SPOHR Date of Exam: 04/28/2024 Medical Rec #:  [id number]      Height:       59.0 in Accession #:    [id number]     Weight:       103.8 lb Date of Birth:  1945-07-14      BSA:          1.396 m Patient Age:    78 years       BP:           161/83 mmHg Patient Gender: F              HR:           54 bpm. Exam Location:  Outpatient Procedure: 2D Echo, Cardiac Doppler and Color Doppler (Both Spectral and Color            Flow Doppler were utilized during procedure). Indications:    I27.20 Pulmonary Hypertension  History:        Patient has prior history of Echocardiogram examinations, most                 recent 07/01/2022. CHF, CAD, Pacemaker, Arrythmias:Atrial                 Fibrillation; Risk  Factors:Hypertension, Sleep Apnea and  Diabetes.  Sonographer:    Damien Senior RDCS Referring Phys: [phone] MIHAI CROITORU IMPRESSIONS  1. Left ventricular ejection fraction, by estimation, is 60 to 65%. The left ventricle has normal function. The left ventricle has no regional wall motion abnormalities. Left ventricular diastolic parameters were normal.  2. Right ventricular systolic function is normal. The right ventricular size is severely enlarged. There is severely elevated pulmonary artery systolic pressure. The estimated right ventricular systolic pressure is 81.6 mmHg.  3. Left atrial size was mildly dilated.  4. The mitral valve is degenerative. Mild mitral valve regurgitation.  5. The tricuspid valve is abnormal. Tricuspid valve regurgitation is severe.  6. The aortic valve is tricuspid. Aortic valve regurgitation is mild. No aortic stenosis is present.  7. The inferior vena cava is normal in size with <50% respiratory variability, suggesting right atrial pressure of 8 mmHg. Comparison(s): Changes from prior study are noted. No severely elevated pulmonary pressures and severely dilated RV. Conclusion(s)/Recommendation(s): Normal BiV systolic function. Severe TR and severely elevated pulmonary pressures. The RV is severely enlarged. FINDINGS  Left Ventricle: Left ventricular ejection fraction, by estimation, is 60 to 65%. The left ventricle has normal function. The left ventricle has no regional wall motion abnormalities. The left ventricular internal cavity size was normal in size. There is  no left ventricular hypertrophy. Left ventricular diastolic parameters were normal. Right Ventricle: The right ventricular size is severely enlarged. No increase in right ventricular wall thickness. Right ventricular systolic function is normal. There is severely elevated pulmonary artery systolic pressure. The tricuspid regurgitant velocity is 4.29 m/s, and with an assumed right atrial pressure of 8 mmHg,  the estimated right ventricular systolic pressure is 81.6 mmHg. Left Atrium: Left atrial size was mildly dilated. Right Atrium: Right atrial size was normal in size. Pericardium: There is no evidence of pericardial effusion. Mitral Valve: The mitral valve is degenerative in appearance. There is mild thickening of the mitral valve leaflet(s). Mild mitral valve regurgitation. Tricuspid Valve: The tricuspid valve is abnormal. Tricuspid valve regurgitation is severe. No evidence of tricuspid stenosis. The flow in the hepatic veins is reversed during ventricular systole. Aortic Valve: The aortic valve is tricuspid. Aortic valve regurgitation is mild. No aortic stenosis is present. Pulmonic Valve: The pulmonic valve was grossly normal. Pulmonic valve regurgitation is not visualized. No evidence of pulmonic stenosis. Aorta: The aortic root and ascending aorta are structurally normal, with no evidence of dilitation. Venous: The inferior vena cava is normal in size with less than 50% respiratory variability, suggesting right atrial pressure of 8 mmHg. IAS/Shunts: No atrial level shunt detected by color flow Doppler. Additional Comments: A device lead is visualized in the right atrium and right ventricle.  LEFT VENTRICLE PLAX 2D LVIDd:         4.20 cm   Diastology LVIDs:         2.50 cm   LV e' medial:    7.72 cm/s LV PW:         0.80 cm   LV E/e' medial:  11.3 LV IVS:        0.70 cm   LV e' lateral:   9.03 cm/s LVOT diam:     1.73 cm   LV E/e' lateral: 9.7 LV SV:         35 LV SV Index:   25 LVOT Area:     2.35 cm  RIGHT VENTRICLE RV S prime:     11.20 cm/s  PULMONARY VEINS TAPSE (M-mode): 1.4 cm  Diastolic Velocity: 51.90 cm/s                             S/D Velocity:       0.50                             Systolic Velocity:  28.30 cm/s LEFT ATRIUM             Index        RIGHT ATRIUM           Index LA diam:        3.58 cm 2.57 cm/m   RA Area:     13.20 cm LA Vol (A2C):   51.3 ml 36.76 ml/m  RA Volume:   30.20 ml   21.64 ml/m LA Vol (A4C):   55.6 ml 39.84 ml/m LA Biplane Vol: 58.6 ml 41.99 ml/m  AORTIC VALVE LVOT Vmax:   71.85 cm/s LVOT Vmean:  48.600 cm/s LVOT VTI:    0.149 m  AORTA Ao Root diam: 2.54 cm Ao Asc diam:  2.56 cm MITRAL VALVE               TRICUSPID VALVE MV Area (PHT): 4.86 cm    TR Peak grad:   73.6 mmHg MV Decel Time: 156 msec    TR Vmax:        429.00 cm/s MV E velocity: 87.50 cm/s MV A velocity: 41.40 cm/s  SHUNTS MV E/A ratio:  2.11        Systemic VTI:  0.15 m                            Systemic Diam: 1.73 cm Georganna Archer Electronically signed by Georganna Archer Signature Date/Time: 04/28/2024/4:13:48 PM    Final      Assessment and Plan:  Shortness of Breath Chronic HFpEF Pulmonary Hypertension Right Large Pleural Effusion Patient has a history of chronic HFpEF and mixed pulmonary venous and pulmonary arterial hypertension.  Recent outpatient echo on 04/08/2024 showed LVEF of 60-65% with normal wall motion and diastolic parameters, severely enlarged RV with normal RV function and severely elevated PASP, mild MR, and severe TR. RVSP had increased from 55.6 mmHg to 81.6 mmHg. Outpatient pro-BNP was elevated at 2,352. Lasix  was increased but she had worsening dyspnea. She now presents with worsening shortness of breath. BNP mildly elevated at 151.2.  Chest x-ray showed a new large right pleural effusion with right lung compressive atelectasis. - She has absent breath sounds on the right consistent with large pleural effusion but otherwise does not appear significantly volume overload on exam.  - Will continue IV Lasix  40mg  twice daily. - Continue Spironolactone  25mg  daily. - appears outpatient sildenafil  was on hold for hypotension/symptoms, will hold on this for now - Agree with IR consult for thoracentesis for pleural effusion. - Will likely need RHC on Monday after diuresis over the weekend.   Atypical Chest pain Non-Obstructive CAD LHC in 2019 showed non-obstructive disease. She  reports atypical right sided chest soreness. EKG shows no acute ischemic changes. High-sensitivity troponin.  - Chest pain likely musculoskeletal - reproducible with palpation. Does not sound like angina/ cardiac chest pain.  - Not on Aspirin  given need for full anticoagulation. - Does not appear to be on a statin at home. - No ischemic work-up necessary.  Permanent Atrial Fibrillation  S/p atrial fibrillation ablation in 2014 and again in 2015 now considered to have permanent atrial fibrillation.   - EKG and telemetry shows V-paced rhythm. - Contineu Lopressor  12.5mg  twice daily. - On chronic anticoagulation with Eliquis  at home. However, will hold this and start IV Heparin  after thoracentesis instead given likely RHC on Monday.  Tachy-Brady Syndrome s/p PPM History of 2:1 AV Block S/p PPM in 2016 with subsequent generator change and new RV lead for high pacing thresholds in 08/2019. Last device check in 02/2024 showed RV was paced 93% of the time. - Followed by Dr. Francyne as an outpatient.   Type 2 Diabetes Mellitus Hemoglobin A1c 7.3% in 05/2023.  - Management per primary team.  Thyroid  Goiter Hypertyroidism S/p right hemithyroidectomy in 09/2022. TSH 0.073 but free T4/ T3 normal on 04/05/2024.  - On Methimazole . - Management per primary team.   Urinary Retention - Management per primary team.   Risk Assessment/Risk Scores:  [place]  Heart Association (NYHA) Functional Class NYHA Class IV  CHA2DS2-VASc Score = 7  This indicates a 11.2% annual risk of stroke. The patient's score is based upon: CHF History: 1 HTN History: 1 Diabetes History: 1 Stroke History: 0 Vascular Disease History: 1 Age Score: 2 Gender Score: 1   For questions or updates, please contact [facility] Please consult [email or web address] for contact info under    Signed, Dayan Desa E Adetokunbo Mccadden, PA-C  04/30/2024 1:58 PM

## 2024-04-30 NOTE — ED Provider Notes (Signed)
 [place] EMERGENCY DEPARTMENT AT [facility] Provider Note  CSN: [id number] Arrival date & time: 04/30/24 1037  Chief Complaint(s) Shortness of Breath  HPI Hailey [name] is a 78 y.o. female with past medical history as below, significant for diastolic heart failure, CAD, A-fib, PPM Saint Jude, polio, pulm hypertension, who presents to the ED with complaint of dyspnea, urinary retention  Patient here with progressive difficulty breathing, difficulty urinating.  She been having difficulty breathing over the past week or so, she was seen by cardiology Dr. Francyne and her Lasix  was increased to 80 mg twice daily.  Echocardiogram on 11/26; returning for worsening pulmonary hypertension.  Patient presents today as she is unable to ambulate more than a few steps of becoming severely dyspneic.  Has been unable to void this morning.  Has worsening suprapubic pain, only dribbles when attempting to void.  Dry cough.  No fevers, chills, nausea or vomiting.  Past Medical History Past Medical History:  Diagnosis Date   Acute on chronic diastolic CHF (congestive heart failure), NYHA class 1 (HCC) 04/29/2013   Allergic rhinitis    Brady-tachy syndrome (HCC)    s/p STJ dual chamber PPM by Dr Francyne   CAD (coronary artery disease)    70% RCA stenosis, treated medically   Control of atrial fibrillation with pacemaker (HCC)    Diabetes mellitus (HCC)    type 2   DJD (degenerative joint disease)    Dyspnea    occasional   Hearing loss    Right ear only - no hearing aids   Hypertension    Hyperthyroidism    Paroxysmal atrial fibrillation (HCC)    Pacemaker St Jude   Pneumonia    years ago- before 2012 per patient   Polio    as a child   PONV (postoperative nausea and vomiting)    Presence of permanent cardiac pacemaker    St Jude pacemaker   Pulmonary disease    Sleep apnea    uses  CPAP nightly   Patient Active Problem List   Diagnosis Date Noted   Acute on chronic heart  failure (HCC) 04/30/2024   Immunization due 04/14/2024   Chronic systolic CHF (congestive heart failure) (HCC) 09/20/2022   Urinary incontinence 09/20/2022   GERD (gastroesophageal reflux disease) 09/20/2022   Obstructive sleep apnea 01/16/2022   Thyroid  goiter 01/16/2022   Right hip pain 09/25/2021   Snoring 08/22/2021   Longstanding persistent atrial fibrillation (HCC) 07/10/2021   Pacemaker battery depletion 08/09/2019   Portal hypertension (HCC) 06/14/2019   DM2 (diabetes mellitus, type 2) (HCC) 12/17/2018   Hypercoagulable state due to atrial fibrillation (HCC) 12/17/2018   PAH (pulmonary artery hypertension) (HCC) 06/01/2018   SSS (sick sinus syndrome) (HCC)    Hyperthyroidism 09/08/2017   Essential hypertension 09/08/2017   Abnormal cardiovascular stress test    Pacemaker lead failure, initial encounter 05/26/2017   Multinodular goiter 02/20/2017   TMJ pain dysfunction syndrome 02/20/2017   Long term current use of anticoagulant 10/11/2015   Second degree AV block 10/10/2015   Atherosclerosis of native coronary artery of native heart with angina pectoris 08/28/2013   Nausea, vomiting, and diarrhea 05/27/2013   Chronic diastolic heart failure (HCC) 05/26/2013   Pacemaker St. Jude accent DR RF , July 2012 03/25/2013   Paroxysmal atrial fibrillation (HCC) 03/02/2012   Symptomatic sinus bradycardia 03/02/2012   Pulmonary nodule 02/18/2011   Mediastinal lymphadenopathy 02/18/2011   Home Medication(s) Prior to Admission medications   Medication Sig Start Date  End Date Taking? Authorizing Provider  acetaminophen  (TYLENOL ) 500 MG tablet Take 500-1,000 mg by mouth every 6 (six) hours as needed (pain.).   Yes [provider]  albuterol  (VENTOLIN  HFA) 108 (90 Base) MCG/ACT inhaler Inhale 2 puffs into the lungs every 6 (six) hours as needed for wheezing or shortness of breath. 04/25/24  Yes Medina-Vargas, Monina C, NP  Cholecalciferol  (VITAMIN D3) 1000 UNITS CAPS Take 1,000  Units by mouth in the morning.   Yes [provider]  ELIQUIS  5 MG TABS tablet TAKE ONE TABLET BY MOUTH TWICE DAILY 12/31/23  Yes Croitoru, Mihai, MD  fluticasone  (FLONASE ) 50 MCG/ACT nasal spray Place 2 sprays into both nostrils daily as needed for allergies or rhinitis. 04/23/24  Yes Medina-Vargas, Monina C, NP  furosemide  (LASIX ) 40 MG tablet Take 2 tablets (80 mg total) by mouth 2 (two) times daily. 04/28/24  Yes Croitoru, Mihai, MD  loperamide  (IMODIUM  A-D) 2 MG tablet Take 2 mg by mouth. Patient taking differently: Take 2 mg by mouth as needed for diarrhea or loose stools. 02/20/17  Yes [provider]  loratadine  (CLARITIN ) 10 MG tablet Take 1 tablet (10 mg total) by mouth daily. 04/23/24  Yes Medina-Vargas, Monina C, NP  methimazole  (TAPAZOLE ) 10 MG tablet Take 20 mg (two tablets) daily. 12/31/23  Yes [provider]  metoprolol  tartrate (LOPRESSOR ) 25 MG tablet Take 12.5 mg by mouth in the morning and at bedtime.   Yes [provider]  ondansetron  (ZOFRAN ) 4 MG tablet TAKE ONE TABLET BY MOUTH EVERY EIGHT HOURS AS NEEDED FOR NAUSEA OR VOMITING 12/18/23  Yes Sherlynn Madden, MD  pantoprazole  (PROTONIX ) 40 MG tablet TAKE ONE TABLET BY MOUTH TWICE DAILY BEFORE A MEAL 12/02/23  Yes Harper, Kristen S, PA-C  potassium chloride  SA (KLOR-CON  M) 20 MEQ tablet Take 1 tablet (20 mEq total) by mouth 3 (three) times daily. 04/28/24  Yes Croitoru, Mihai, MD  sildenafil  (REVATIO ) 20 MG tablet TAKE ONE TABLET BY MOUTH 3 TIMES DAILY 11/26/23  Yes Croitoru, Mihai, MD  sodium chloride  (NASAL MOISTURIZING SPRAY) 0.65 % nasal spray Place 1 spray into the nose as needed for congestion.   Yes [provider]  spironolactone  (ALDACTONE ) 25 MG tablet TAKE ONE TABLET BY MOUTH EVERY DAY 03/08/24  Yes West, Katlyn D, NP  tizanidine (ZANAFLEX) 2 MG capsule Take 2 mg by mouth at bedtime.   Yes [provider]  traMADol  (ULTRAM ) 50 MG tablet Take 50-100 mg by mouth every  8 (eight) hours as needed for moderate pain (pain score 4-6).   Yes [provider]                                                                                                                                    Past Surgical History Past Surgical History:  Procedure Laterality Date   arm surgery d/t fx Right 1996   ATRIAL FIBRILLATION ABLATION  07/31/2012; 08-10-13  PVI by Dr Kelsie   ATRIAL FIBRILLATION ABLATION N/A 07/31/2012   Procedure: ATRIAL FIBRILLATION ABLATION;  Surgeon: Lynwood Kelsie, MD;  Location: [facility] CATH LAB;  Service: Cardiovascular;  Laterality: N/A;   ATRIAL FIBRILLATION ABLATION N/A 08/10/2013   Procedure: ATRIAL FIBRILLATION ABLATION;  Surgeon: Lynwood JONETTA Kelsie, MD;  Location: [facility] CATH LAB;  Service: Cardiovascular;  Laterality: N/A;   BACK SURGERY  07/26/2012   dR. nUDELMAN   BIOPSY  06/30/2023   Procedure: BIOPSY;  Surgeon: Cindie Carlin POUR, DO;  Location: [facility] ENDO SUITE;  Service: Endoscopy;;   CARDIAC CATHETERIZATION  12/03/2010   single vessel mid RCA   CARDIAC ELECTROPHYSIOLOGY MAPPING AND ABLATION  07/31/2012   Dr. Lynwood Kelsie   CESAREAN SECTION  09/20/1974   Dr. Lonni   CHOLECYSTECTOMY  1994   COLONOSCOPY WITH PROPOFOL  N/A 03/21/2015   Procedure: COLONOSCOPY WITH PROPOFOL ;  Surgeon: Renaye Sous, MD;  Location: [facility] ENDOSCOPY;  Service: Endoscopy;  Laterality: N/A;   COLONOSCOPY WITH PROPOFOL  N/A 06/30/2023   Procedure: COLONOSCOPY WITH PROPOFOL ;  Surgeon: Cindie Carlin POUR, DO;  Location: [facility] ENDO SUITE;  Service: Endoscopy;  Laterality: N/A;  11:15 AM, ASA 4   DIAGNOSTIC MAMMOGRAM  04/16/2017   Duwaine HERO. Morris, DO   ESOPHAGOGASTRODUODENOSCOPY (EGD) WITH PROPOFOL  N/A 06/30/2023   Procedure: ESOPHAGOGASTRODUODENOSCOPY (EGD) WITH PROPOFOL ;  Surgeon: Cindie Carlin POUR, DO;  Location: [facility] ENDO SUITE;  Service: Endoscopy;  Laterality: N/A;  11:15 AM, ASA 4   EYE SURGERY Bilateral    remove cataracts   LAPAROSCOPIC HYSTERECTOMY  Late 70s to early 80s   LEFT  HEART CATH AND CORONARY ANGIOGRAPHY N/A 07/30/2017   Procedure: LEFT HEART CATH AND CORONARY ANGIOGRAPHY;  Surgeon: Burnard Debby LABOR, MD;  Location: [facility] INVASIVE CV LAB;  Service: Cardiovascular;  Laterality: N/A;   LUMBAR LAMINECTOMY/DECOMPRESSION MICRODISCECTOMY Right 12/23/2012   Procedure: Right L5-S1 Laminotomy for resection of synovial cyst;  Surgeon: Lamar LELON Peaches, MD;  Location: [facility] NEURO ORS;  Service: Neurosurgery;  Laterality: Right;  Right Lumbar five-sacral one Laminotomy for resection of synovial cyst   NM MYOCAR PERF WALL MOTION  12/20/2010   normal   PACEMAKER IMPLANT N/A 08/09/2019   Procedure: PACEMAKER IMPLANT;  Surgeon: Francyne Headland, MD;  Location: [facility] INVASIVE CV LAB;  Service: Cardiovascular;  Laterality: N/A;   PACEMAKER INSERTION  12/03/2010   SJM Accent DR RF implanted by Dr Francyne   POLYPECTOMY  06/30/2023   Procedure: POLYPECTOMY;  Surgeon: Cindie Carlin POUR, DO;  Location: [facility] ENDO SUITE;  Service: Endoscopy;;   RIGHT HEART CATH N/A 04/22/2018   Procedure: RIGHT HEART CATH;  Surgeon: Darron Deatrice LABOR, MD;  Location: [facility] INVASIVE CV LAB;  Service: Cardiovascular;  Laterality: N/A;   RIGHT HEART CATH N/A 07/09/2022   Procedure: RIGHT HEART CATH;  Surgeon: Cherrie Toribio SAUNDERS, MD;  Location: [facility] INVASIVE CV LAB;  Service: Cardiovascular;  Laterality: N/A;   TEE WITHOUT CARDIOVERSION N/A 07/30/2012   Procedure: TRANSESOPHAGEAL ECHOCARDIOGRAM (TEE);  Surgeon: Headland Francyne, MD;  Location: [facility] ENDOSCOPY;  Service: Cardiovascular;  Laterality: N/A;  h&p in file-hope   TEE WITHOUT CARDIOVERSION N/A 08/09/2013   Procedure: TRANSESOPHAGEAL ECHOCARDIOGRAM (TEE);  Surgeon: Redell GORMAN Shallow, MD;  Location: [facility] ENDOSCOPY;  Service: Cardiovascular;  Laterality: N/A;   THYROID  LOBECTOMY Right 09/20/2022   Procedure: RIGHT HEMITHYROIDECTOMY;  Surgeon: Llewellyn Gerard LABOR, DO;  Location: [facility] OR;  Service: ENT;  Laterality: Right;   TOTAL ABDOMINAL HYSTERECTOMY  1981   ULTRASOUND  GUIDANCE FOR VASCULAR ACCESS  04/22/2018  Procedure: Ultrasound Guidance For Vascular Access;  Surgeon: Darron Deatrice LABOR, MD;  Location: [facility] INVASIVE CV LAB;  Service: Cardiovascular;;   Family History Family History  Problem Relation Age of Onset   Cancer Brother    Heart attack Sister    Breast cancer Sister    Diabetes Sister    Heart disease Sister    Breast cancer Sister    Dementia Sister    Colon cancer Sister    Heart disease Sister    Breast cancer Sister    Suicidality Brother    Prostate cancer Brother    Dementia Brother     Social History Social History   Tobacco Use   Smoking status: Never   Smokeless tobacco: Never  Vaping Use   Vaping status: Never Used  Substance Use Topics   Alcohol use: No   Drug use: No   Allergies Codeine, Ketorolac , Meperidine , Multaq  [dronedarone ], Xarelto [rivaroxaban], Flexeril  [cyclobenzaprine ], and Latex  Review of Systems A thorough review of systems was obtained and all systems are negative except as noted in the HPI and PMH.   Physical Exam Vital Signs  I have reviewed the triage vital signs BP (!) 150/63   Pulse 64   Temp 98.2 F (36.8 C) (Oral)   Resp (!) 23   Ht 4' 11 (1.499 m)   Wt 46.7 kg   SpO2 100%   BMI 20.80 kg/m  Physical Exam Vitals and nursing note reviewed.  Constitutional:      General: She is not in acute distress.    Appearance: Normal appearance.  HENT:     Head: Normocephalic and atraumatic.     Right Ear: External ear normal.     Left Ear: External ear normal.     Nose: Nose normal.     Mouth/Throat:     Mouth: Mucous membranes are moist.  Eyes:     General: No scleral icterus.       Right eye: No discharge.        Left eye: No discharge.  Cardiovascular:     Rate and Rhythm: Normal rate and regular rhythm.     Pulses: Normal pulses.     Heart sounds: Normal heart sounds.  Pulmonary:     Effort: Pulmonary effort is normal. Tachypnea present. No respiratory distress.     Breath  sounds: Decreased air movement present. No stridor. Examination of the right-middle field reveals decreased breath sounds. Examination of the right-lower field reveals decreased breath sounds. Decreased breath sounds present.     Comments: Highly diminished lung sounds to right Chest:    Abdominal:     General: Abdomen is flat. There is no distension.     Palpations: Abdomen is soft.     Tenderness: There is no abdominal tenderness.  Musculoskeletal:     Cervical back: No rigidity.     Right lower leg: No edema.     Left lower leg: No edema.  Skin:    General: Skin is warm and dry.     Capillary Refill: Capillary refill takes less than 2 seconds.  Neurological:     Mental Status: She is alert.  Psychiatric:        Mood and Affect: Mood normal.        Behavior: Behavior normal. Behavior is cooperative.     ED Results and Treatments Labs (all labs ordered are listed, but only abnormal results are displayed) Labs Reviewed  BASIC METABOLIC PANEL WITH GFR - Abnormal; Notable for  the following components:      Result Value   Sodium 131 (*)    Chloride 91 (*)    Glucose, Bld 132 (*)    GFR, Estimated 57 (*)    All other components within normal limits  CBC - Abnormal; Notable for the following components:   MCH 25.7 (*)    RDW 15.9 (*)    Platelets 432 (*)    All other components within normal limits  BRAIN NATRIURETIC PEPTIDE - Abnormal; Notable for the following components:   B Natriuretic Peptide 151.2 (*)    All other components within normal limits  RESP PANEL BY RT-PCR (RSV, FLU A&B, COVID)  RVPGX2  URINALYSIS, ROUTINE W REFLEX MICROSCOPIC  CBC  CREATININE, SERUM  TROPONIN I (HIGH SENSITIVITY)  TROPONIN I (HIGH SENSITIVITY)                                                                                                                          Radiology DG Chest 2 View Result Date: 04/30/2024 CLINICAL DATA:  Shortness of breath. Atrial fibrillation. Congestive  heart failure. EXAM: CHEST - 2 VIEW COMPARISON:  11/07/2023 FINDINGS: New large right pleural effusion is seen, with right lung compressive atelectasis. Left lung remains clear. Heart size is stable. Pacemaker again noted. Stable mediastinal mass causing tracheal deviation to the right, consistent with known goiter as demonstrated on prior neck CT. IMPRESSION: New large right pleural effusion, with right lung compressive atelectasis. Stable mediastinal mass, consistent with known goiter. Electronically Signed   By: Norleen DELENA Kil M.D.   On: 04/30/2024 11:45    Pertinent labs & imaging results that were available during my care of the patient were reviewed by me and considered in my medical decision making (see MDM for details).  Medications Ordered in ED Medications  enoxaparin (LOVENOX) injection 40 mg (has no administration in time range)  furosemide  (LASIX ) injection 80 mg (80 mg Intravenous Given 04/30/24 1419)                                                                                                                                     Procedures .Critical Care  Performed by: Elnor Jayson DELENA, DO Authorized by: Elnor Jayson DELENA, DO   Critical care provider statement:    Critical care time (minutes):  40   Critical care time was exclusive of:  Separately billable procedures and  treating other patients   Critical care was necessary to treat or prevent imminent or life-threatening deterioration of the following conditions:  Cardiac failure   Critical care was time spent personally by me on the following activities:  Development of treatment plan with patient or surrogate, discussions with consultants, evaluation of patient's response to treatment, examination of patient, ordering and review of laboratory studies, ordering and review of radiographic studies, ordering and performing treatments and interventions, pulse oximetry, re-evaluation of patient's condition, review of old charts and  obtaining history from patient or surrogate   Care discussed with: admitting provider     (including critical care time)  Medical Decision Making / ED Course    Medical Decision Making:    ALERA QUEVEDO is a 78 y.o. female with past medical history as below, significant for diastolic heart failure, CAD, A-fib, PPM Saint Jude, polio, pulm hypertension, who presents to the ED with complaint of dyspnea, urinary retention. The complaint involves an extensive differential diagnosis and also carries with it a high risk of complications and morbidity.  Serious etiology was considered. Ddx includes but is not limited to: In my evaluation of this patient's dyspnea my DDx includes, but is not limited to, pneumonia, pulmonary embolism, pneumothorax, pulmonary edema, metabolic acidosis, asthma, COPD, cardiac cause, anemia, anxiety, etc.    Complete initial physical exam performed, notably the patient was in no acute distress but is tachypneic.    Reviewed and confirmed nursing documentation for past medical history, family history, social history.  Vital signs reviewed.    Acute CHF Exertional dyspnea, orthopnea Pleural eff> - Progressively worsening exertional dyspnea despite increased dose of Lasix .  No lower extremity edema - Lung sounds grossly diminished on the right.  Chest x-ray with large right-sided pleural effusion > start IV diuresis - 2LNC for increased wob/tachypnea; no hypoxia - Patient with acute CHF despite increased dose of home Lasix  and medication compliance.  Plan admission for acute CHF - Cardiology was consulted, recommend medicine admission - Patient to require Foley secondary to urinary retention.  Has been responding well to diuresis. - She is anticoagulated on Eliquis , thora was ordered  Clinical Course as of 04/30/24 1504  Fri Apr 30, 2024  1126 CXR w/ large right sided pleural eff [SG]  1213 >300 on bladder scan, plan foley [SG]  1344 Spoke w/ cardiology, will come  see [SG]    Clinical Course User Index [SG] Elnor Jayson LABOR, DO     Admit [facility]               Additional history obtained: -Additional history obtained from family -External records from outside source obtained and reviewed including: Chart review including previous notes, labs, imaging, consultation notes including  Recent echo, home medications, prior labs   Lab Tests: -I ordered, reviewed, and interpreted labs.   The pertinent results include:   Labs Reviewed  BASIC METABOLIC PANEL WITH GFR - Abnormal; Notable for the following components:      Result Value   Sodium 131 (*)    Chloride 91 (*)    Glucose, Bld 132 (*)    GFR, Estimated 57 (*)    All other components within normal limits  CBC - Abnormal; Notable for the following components:   MCH 25.7 (*)    RDW 15.9 (*)    Platelets 432 (*)    All other components within normal limits  BRAIN NATRIURETIC PEPTIDE - Abnormal; Notable for the following components:   B Natriuretic Peptide  151.2 (*)    All other components within normal limits  RESP PANEL BY RT-PCR (RSV, FLU A&B, COVID)  RVPGX2  URINALYSIS, ROUTINE W REFLEX MICROSCOPIC  CBC  CREATININE, SERUM  TROPONIN I (HIGH SENSITIVITY)  TROPONIN I (HIGH SENSITIVITY)    Notable for labs are stable  EKG   EKG Interpretation Date/Time:  Friday April 30 2024 10:48:41 EST Ventricular Rate:  66 PR Interval:    QRS Duration:  118 QT Interval:  446 QTC Calculation: 467 R Axis:   61  Text Interpretation: Ventricular-paced rhythm Abnormal ECG When compared with ECG of 02-Jan-2024 14:47, PREVIOUS ECG IS PRESENT Confirmed by Elnor Savant ([id number]) on 04/30/2024 11:04:42 AM         Imaging Studies ordered: I ordered imaging studies including chest x-ray I independently visualized the following imaging with scope of interpretation limited to determining acute life threatening conditions related to emergency care; findings noted above I agree with the  radiologist interpretation If any imaging was obtained with contrast I closely monitored patient for any possible adverse reaction a/w contrast administration in the emergency department   Medicines ordered and prescription drug management: Meds ordered this encounter  Medications   DISCONTD: furosemide  (LASIX ) injection 40 mg   furosemide  (LASIX ) injection 80 mg   enoxaparin (LOVENOX) injection 40 mg    -I have reviewed the patients home medicines and have made adjustments as needed   Consultations Obtained: I requested consultation with cardiology, [facility],  and discussed lab and imaging findings as well as pertinent plan - they recommend: Admit   Cardiac Monitoring: The patient was maintained on a cardiac monitor.  I personally viewed and interpreted the cardiac monitored which showed an underlying rhythm of: paced Continuous pulse oximetry interpreted by myself, 98% on 2L.    Social Determinants of Health:  Diagnosis or treatment significantly limited by social determinants of health: na   Reevaluation: After the interventions noted above, I reevaluated the patient and found that they have improved  Co morbidities that complicate the patient evaluation  Past Medical History:  Diagnosis Date   Acute on chronic diastolic CHF (congestive heart failure), NYHA class 1 (HCC) 04/29/2013   Allergic rhinitis    Brady-tachy syndrome (HCC)    s/p STJ dual chamber PPM by Dr Francyne   CAD (coronary artery disease)    70% RCA stenosis, treated medically   Control of atrial fibrillation with pacemaker (HCC)    Diabetes mellitus (HCC)    type 2   DJD (degenerative joint disease)    Dyspnea    occasional   Hearing loss    Right ear only - no hearing aids   Hypertension    Hyperthyroidism    Paroxysmal atrial fibrillation (HCC)    Pacemaker St Jude   Pneumonia    years ago- before 2012 per patient   Polio    as a child   PONV (postoperative nausea and vomiting)    Presence of  permanent cardiac pacemaker    St Jude pacemaker   Pulmonary disease    Sleep apnea    uses  CPAP nightly      Dispostion: Disposition decision including need for hospitalization was considered, and patient admitted to the hospital.    Final Clinical Impression(s) / ED Diagnoses Final diagnoses:  Pleural effusion  Acute congestive heart failure, unspecified heart failure type (HCC)  SOB (shortness of breath)  Urinary retention        Elnor Savant LABOR, DO 04/30/24 1504

## 2024-04-30 NOTE — Telephone Encounter (Addendum)
 Patient's daughter Olam contacted after hour answering service due to worsening shortness of breath.  Patient has a history of A-fib ablation and pulmonary hypertension.  Previous right heart cath showed a mixed etiology behind pulmonary hypertension.  RVSP jumped from the previous 40s up to 81 mmHg on recent echocardiogram.  She sleeps on 4 pillows.  She gets short of breath walking from 1 room to the next.  Patient's other daughter is currently transporting the patient from [facility] to [facility] ED.  Patient likely will need diuresis, gen card/heart failure evaluation and potentially right heart cath.

## 2024-04-30 NOTE — ED Notes (Signed)
Patient placed on 2L NC per order.

## 2024-04-30 NOTE — ED Notes (Signed)
 EDP notified of patient runs of vtach per CCMD.

## 2024-04-30 NOTE — Procedures (Signed)
 PROCEDURE SUMMARY:  Successful image-guided right thoracentesis. Yielded 1.1 liters of slightly hazy yellow fluid. Patient tolerated procedure well. EBL: trace No immediate complications.  Specimen was sent for labs. Post procedure CXR ordered.  Please see imaging section of Epic for full dictation.  Kimble DEL Beckam Abdulaziz PA-C 04/30/2024 4:18 PM

## 2024-04-30 NOTE — ED Notes (Signed)
 NT bladder scanned pt pt is retaining 302 mL

## 2024-04-30 NOTE — Progress Notes (Signed)
 ANTICOAGULATION CONSULT NOTE  Pharmacy Consult for Heparin  Indication: atrial fibrillation  Allergies  Allergen Reactions   Codeine Nausea And Vomiting and Nausea Only   Ketorolac  Nausea And Vomiting, Nausea Only and Other (See Comments)    pt told to never take this med   Meperidine  Nausea Only and Nausea And Vomiting   Multaq  [Dronedarone ] Other (See Comments)    Increased fluid retention   Xarelto [Rivaroxaban] Diarrhea   Flexeril  [Cyclobenzaprine ] Nausea And Vomiting   Latex Rash    Pt stated on 09/20/2022    Patient Measurements: Height: 4' 11 (149.9 cm) Weight: 46.7 kg (103 lb) IBW/kg (Calculated) : 43.2 Heparin  Dosing Weight: 46.7 kg  Vital Signs: Temp: 98.2 F (36.8 C) (11/28 1429) Temp Source: Oral (11/28 1429) BP: 150/63 (11/28 1430) Pulse Rate: 64 (11/28 1430)  Labs: Recent Labs    04/30/24 1154  HGB 12.8  HCT 41.9  PLT 432*  CREATININE 1.00  TROPONINIHS 7    Estimated Creatinine Clearance: 31.1 mL/min (by C-G formula based on SCr of 1 mg/dL).   Medical History: Past Medical History:  Diagnosis Date   Acute on chronic diastolic CHF (congestive heart failure), NYHA class 1 (HCC) 04/29/2013   Allergic rhinitis    Brady-tachy syndrome (HCC)    s/p STJ dual chamber PPM by Dr Francyne   CAD (coronary artery disease)    70% RCA stenosis, treated medically   Control of atrial fibrillation with pacemaker (HCC)    Diabetes mellitus (HCC)    type 2   DJD (degenerative joint disease)    Dyspnea    occasional   Hearing loss    Right ear only - no hearing aids   Hypertension    Hyperthyroidism    Paroxysmal atrial fibrillation (HCC)    Pacemaker St Jude   Pneumonia    years ago- before 2012 per patient   Polio    as a child   PONV (postoperative nausea and vomiting)    Presence of permanent cardiac pacemaker    St Jude pacemaker   Pulmonary disease    Sleep apnea    uses  CPAP nightly    Medications:  (Not in a hospital  admission)  Scheduled:   [START ON 05/01/2024] furosemide   40 mg Intravenous BID   lidocaine -EPINEPHrine   20 mL Intradermal Once   Infusions:  PRN:   Assessment: 20 yof with a history of CAD, HF, AF on eliquis , tachybradycardia syndrome s/p PPM, HTN, T2DM. Patient is presenting with SOB. Heparin  per pharmacy consult placed for atrial fibrillation. Cards planning for possible RHC 12/1.  S/p IR-guided thoracentesis 11/28 for rt sided pleural effusion  Patient is on apixaban  prior to arrival. Last dose 11/27 at bedtime per medication history. Will require aPTT monitoring due to likely falsely high anti-Xa level secondary to DOAC use.  Hgb 12.8; plt 432  Goal of Therapy:  Heparin  level 0.3-0.7 units/ml aPTT 66-102 seconds Monitor platelets by anticoagulation protocol: Yes   Plan:  No initial heparin  bolus Start heparin  infusion at 650 units/hr 4 hours post thoracentesis Check aPTT & anti-Xa level in 8 hours and daily while on heparin  Continue to monitor via aPTT until levels are correlated Continue to monitor H&H and platelets  Dorn Buttner, PharmD, BCPS 04/30/2024 4:07 PM ED Clinical Pharmacist -  [phone]

## 2024-04-30 NOTE — H&P (Signed)
 History and Physical    Patient: Hailey [name] FMW:[id number] DOB: Oct 05, 1945 DOA: 04/30/2024 DOS: the patient was seen and examined on 04/30/2024 PCP: [name], NP  Patient coming from: Home  Chief Complaint:  Chief Complaint  Patient presents with   Shortness of Breath   HPI: Hailey [name] is a 78 y.o. female with medical history significant of CAD (70% RCA stenosis treated medically), Afib, tachy-brady syndrome s/p PPM, HFpEF (EF 60-65% at [facility] in 11/2023), HTN, DM2, hyperthyroidism, OSA, and most recent admission to [facility] for HFpEF exacerbation treated with IV lasix  40mg  BID who p/w HFpEF exacerbation.  Pt is a limited historian. From what I can gather, pt presented with SOB over the past week c/b BLE edema that did not improve after increasing OP diuretics.  In the ED, pt tachypneic on 2-3L [place]. Labs notable for BNP 151. CXR w/ R pleural effusion. IR consult for R thoracentesis (s/p 1.1L hazy yellow fluid removed). EDP consulted Cards and requested medicine admission.   Review of Systems: As mentioned in the history of present illness. All other systems reviewed and are negative. Past Medical History:  Diagnosis Date   Acute on chronic diastolic CHF (congestive heart failure), NYHA class 1 (HCC) 04/29/2013   Allergic rhinitis    Brady-tachy syndrome (HCC)    s/p STJ dual chamber PPM by Dr Francyne   CAD (coronary artery disease)    70% RCA stenosis, treated medically   Control of atrial fibrillation with pacemaker (HCC)    Diabetes mellitus (HCC)    type 2   DJD (degenerative joint disease)    Dyspnea    occasional   Hearing loss    Right ear only - no hearing aids   Hypertension    Hyperthyroidism    Paroxysmal atrial fibrillation (HCC)    Pacemaker St Jude   Pneumonia    years ago- before 2012 per patient   Polio    as a child   PONV (postoperative nausea and vomiting)    Presence of permanent cardiac pacemaker    St Jude pacemaker    Pulmonary disease    Sleep apnea    uses  CPAP nightly   Past Surgical History:  Procedure Laterality Date   arm surgery d/t fx Right 1996   ATRIAL FIBRILLATION ABLATION  07/31/2012; 08-10-13   PVI by Dr Kelsie   ATRIAL FIBRILLATION ABLATION N/A 07/31/2012   Procedure: ATRIAL FIBRILLATION ABLATION;  Surgeon: Lynwood Kelsie, MD;  Location[facility] CATH LAB;  Service: Cardiovascular;  Laterality: N/A;   ATRIAL FIBRILLATION ABLATION N/A 08/10/2013   Procedure: ATRIAL FIBRILLATION ABLATION;  Surgeon: Lynwood JONETTA Kelsie, MD;  Location: [facility] CATH LAB;  Service: Cardiovascular;  Laterality: N/A;   BACK SURGERY  07/26/2012   dR. nUDELMAN   BIOPSY  06/30/2023   Procedure: BIOPSY;  Surgeon: Cindie Carlin POUR, DO;  Location: [facility] ENDO SUITE;  Service: Endoscopy;;   CARDIAC CATHETERIZATION  12/03/2010   single vessel mid RCA   CARDIAC ELECTROPHYSIOLOGY MAPPING AND ABLATION  07/31/2012   Dr. Lynwood Kelsie   CESAREAN SECTION  09/20/1974   Dr. Lonni   CHOLECYSTECTOMY  1994   COLONOSCOPY WITH PROPOFOL  N/A 03/21/2015   Procedure: COLONOSCOPY WITH PROPOFOL ;  Surgeon: Renaye Sous, MD;  Location: [facility] ENDOSCOPY;  Service: Endoscopy;  Laterality: N/A;   COLONOSCOPY WITH PROPOFOL  N/A 06/30/2023   Procedure: COLONOSCOPY WITH PROPOFOL ;  Surgeon: Cindie Carlin POUR, DO;  Location: [facility] ENDO SUITE;  Service: Endoscopy;  Laterality: N/A;  11:15 AM, ASA 4   DIAGNOSTIC MAMMOGRAM  04/16/2017   Duwaine HERO. Morris, DO   ESOPHAGOGASTRODUODENOSCOPY (EGD) WITH PROPOFOL  N/A 06/30/2023   Procedure: ESOPHAGOGASTRODUODENOSCOPY (EGD) WITH PROPOFOL ;  Surgeon: Cindie Carlin POUR, DO;  Location: [facility] ENDO SUITE;  Service: Endoscopy;  Laterality: N/A;  11:15 AM, ASA 4   EYE SURGERY Bilateral    remove cataracts   IR THORACENTESIS ASP PLEURAL SPACE W/IMG GUIDE  04/30/2024   LAPAROSCOPIC HYSTERECTOMY  Late 70s to early 80s   LEFT HEART CATH AND CORONARY ANGIOGRAPHY N/A 07/30/2017   Procedure: LEFT HEART CATH AND CORONARY ANGIOGRAPHY;  Surgeon: Burnard Debby LABOR, MD;  Location: [facility] INVASIVE CV LAB;  Service: Cardiovascular;  Laterality: N/A;   LUMBAR LAMINECTOMY/DECOMPRESSION MICRODISCECTOMY Right 12/23/2012   Procedure: Right L5-S1 Laminotomy for resection of synovial cyst;  Surgeon: Lamar LELON Peaches, MD;  Location: [facility] NEURO ORS;  Service: Neurosurgery;  Laterality: Right;  Right Lumbar five-sacral one Laminotomy for resection of synovial cyst   NM MYOCAR PERF WALL MOTION  12/20/2010   normal   PACEMAKER IMPLANT N/A 08/09/2019   Procedure: PACEMAKER IMPLANT;  Surgeon: Francyne Headland, MD;  Location: [facility] INVASIVE CV LAB;  Service: Cardiovascular;  Laterality: N/A;   PACEMAKER INSERTION  12/03/2010   SJM Accent DR RF implanted by Dr Francyne   POLYPECTOMY  06/30/2023   Procedure: POLYPECTOMY;  Surgeon: Cindie Carlin POUR, DO;  Location: [facility] ENDO SUITE;  Service: Endoscopy;;   RIGHT HEART CATH N/A 04/22/2018   Procedure: RIGHT HEART CATH;  Surgeon: Darron Deatrice LABOR, MD;  Location: [facility] INVASIVE CV LAB;  Service: Cardiovascular;  Laterality: N/A;   RIGHT HEART CATH N/A 07/09/2022   Procedure: RIGHT HEART CATH;  Surgeon: Cherrie Toribio SAUNDERS, MD;  Location: [facility] INVASIVE CV LAB;  Service: Cardiovascular;  Laterality: N/A;   TEE WITHOUT CARDIOVERSION N/A 07/30/2012   Procedure: TRANSESOPHAGEAL ECHOCARDIOGRAM (TEE);  Surgeon: Headland Francyne, MD;  Location: [facility] ENDOSCOPY;  Service: Cardiovascular;  Laterality: N/A;  h&p in file-hope   TEE WITHOUT CARDIOVERSION N/A 08/09/2013   Procedure: TRANSESOPHAGEAL ECHOCARDIOGRAM (TEE);  Surgeon: Redell GORMAN Shallow, MD;  Location: [facility] ENDOSCOPY;  Service: Cardiovascular;  Laterality: N/A;   THYROID  LOBECTOMY Right 09/20/2022   Procedure: RIGHT HEMITHYROIDECTOMY;  Surgeon: Llewellyn Gerard LABOR, DO;  Location: [facility] OR;  Service: ENT;  Laterality: Right;   TOTAL ABDOMINAL HYSTERECTOMY  1981   ULTRASOUND GUIDANCE FOR VASCULAR ACCESS  04/22/2018   Procedure: Ultrasound Guidance For Vascular Access;  Surgeon: Darron Deatrice LABOR, MD;   Location: [facility] INVASIVE CV LAB;  Service: Cardiovascular;;   Social History:  reports that she has never smoked. She has never used smokeless tobacco. She reports that she does not drink alcohol and does not use drugs.  Allergies  Allergen Reactions   Codeine Nausea And Vomiting and Nausea Only   Ketorolac  Nausea And Vomiting, Nausea Only and Other (See Comments)    pt told to never take this med   Meperidine  Nausea Only and Nausea And Vomiting   Multaq  [Dronedarone ] Other (See Comments)    Increased fluid retention   Xarelto [Rivaroxaban] Diarrhea   Flexeril  [Cyclobenzaprine ] Nausea And Vomiting   Latex Rash    Pt stated on 09/20/2022    Family History  Problem Relation Age of Onset   Cancer Brother    Heart attack Sister    Breast cancer Sister    Diabetes Sister    Heart disease Sister    Breast cancer Sister    Dementia Sister  Colon cancer Sister    Heart disease Sister    Breast cancer Sister    Suicidality Brother    Prostate cancer Brother    Dementia Brother     Prior to Admission medications   Medication Sig Start Date End Date Taking? Authorizing Provider  acetaminophen  (TYLENOL ) 500 MG tablet Take 500-1,000 mg by mouth every 6 (six) hours as needed (pain.).   Yes [provider]  albuterol  (VENTOLIN  HFA) 108 (90 Base) MCG/ACT inhaler Inhale 2 puffs into the lungs every 6 (six) hours as needed for wheezing or shortness of breath. 04/25/24  Yes Medina-Vargas, Monina C, NP  Cholecalciferol  (VITAMIN D3) 1000 UNITS CAPS Take 1,000 Units by mouth in the morning.   Yes [provider]  ELIQUIS  5 MG TABS tablet TAKE ONE TABLET BY MOUTH TWICE DAILY 12/31/23  Yes Croitoru, Mihai, MD  fluticasone  (FLONASE ) 50 MCG/ACT nasal spray Place 2 sprays into both nostrils daily as needed for allergies or rhinitis. 04/23/24  Yes Medina-Vargas, Monina C, NP  furosemide  (LASIX ) 40 MG tablet Take 2 tablets (80 mg total) by mouth 2 (two) times daily. 04/28/24  Yes  Croitoru, Mihai, MD  loperamide  (IMODIUM  A-D) 2 MG tablet Take 2 mg by mouth. Patient taking differently: Take 2 mg by mouth as needed for diarrhea or loose stools. 02/20/17  Yes [provider]  loratadine  (CLARITIN ) 10 MG tablet Take 1 tablet (10 mg total) by mouth daily. 04/23/24  Yes Medina-Vargas, Monina C, NP  methimazole  (TAPAZOLE ) 10 MG tablet Take 20 mg (two tablets) daily. 12/31/23  Yes [provider]  metoprolol  tartrate (LOPRESSOR ) 25 MG tablet Take 12.5 mg by mouth in the morning and at bedtime.   Yes [provider]  ondansetron  (ZOFRAN ) 4 MG tablet TAKE ONE TABLET BY MOUTH EVERY EIGHT HOURS AS NEEDED FOR NAUSEA OR VOMITING 12/18/23  Yes Sherlynn Madden, MD  pantoprazole  (PROTONIX ) 40 MG tablet TAKE ONE TABLET BY MOUTH TWICE DAILY BEFORE A MEAL 12/02/23  Yes Rudy, Kristen S, PA-C  potassium chloride  SA (KLOR-CON  M) 20 MEQ tablet Take 1 tablet (20 mEq total) by mouth 3 (three) times daily. 04/28/24  Yes Croitoru, Mihai, MD  sildenafil  (REVATIO ) 20 MG tablet TAKE ONE TABLET BY MOUTH 3 TIMES DAILY 11/26/23  Yes Croitoru, Mihai, MD  sodium chloride  (NASAL MOISTURIZING SPRAY) 0.65 % nasal spray Place 1 spray into the nose as needed for congestion.   Yes [provider]  spironolactone  (ALDACTONE ) 25 MG tablet TAKE ONE TABLET BY MOUTH EVERY DAY 03/08/24  Yes West, Katlyn D, NP  tizanidine (ZANAFLEX) 2 MG capsule Take 2 mg by mouth at bedtime.   Yes [provider]  traMADol  (ULTRAM ) 50 MG tablet Take 50-100 mg by mouth every 8 (eight) hours as needed for moderate pain (pain score 4-6).   Yes [provider]    Physical Exam: Vitals:   04/30/24 1430 04/30/24 1507 04/30/24 1530 04/30/24 1720  BP: (!) 150/63  137/83 125/62  Pulse: 64  78   Resp: (!) 23  (!) 25 20  Temp:    98.2 F (36.8 C)  TempSrc:    Oral  SpO2: 100% 100% 94% 100%  Weight:      Height:       General: Alert, oriented x3, resting comfortably in no acute  distress HEENT: EOMI, oropharynx clear, moist mucous membranes, hearing intact Neck: Trachea midline and no gross thyromegaly Respiratory: Lungs clear to auscultation bilaterally with normal respiratory effort; no w/r/r Cardiovascular: Regular  rate and rhythm w/o m/r/g Abdomen: Soft, nontender, nondistended. Positive bowel sounds MSK: No obvious joint deformities or swelling Skin: No obvious rashes or lesions Neurologic: Awake, alert, spontaneously moves all extremities, strength intact Psychiatric: Appropriate mood and affect, conversational and cooperative   Data Reviewed:  Lab Results  Component Value Date   WBC 17.2 (H) 04/30/2024   HGB 13.4 04/30/2024   HCT 42.9 04/30/2024   MCV 82.3 04/30/2024   PLT 415 (H) 04/30/2024   Lab Results  Component Value Date   GLUCOSE 132 (H) 04/30/2024   CALCIUM  9.2 04/30/2024   NA 131 (L) 04/30/2024   K 4.3 04/30/2024   CO2 28 04/30/2024   CL 91 (L) 04/30/2024   BUN 13 04/30/2024   CREATININE 0.98 04/30/2024   Lab Results  Component Value Date   ALT 8 04/23/2024   AST 12 04/23/2024   ALKPHOS 92 05/25/2023   BILITOT 1.4 (H) 04/23/2024   Lab Results  Component Value Date   INR 1.4 (H) 07/24/2017   INR 1.08 12/04/2010   INR 1.12 12/03/2010   Radiology: DG Chest 1 View Result Date: 04/30/2024 CLINICAL DATA:  Status post thoracentesis. EXAM: CHEST  1 VIEW COMPARISON:  04/30/2024 FINDINGS: Right pleural effusion has decreased in size. Persistent blunting at the right costophrenic angle could represent residual pleural fluid, atelectasis or consolidation. Negative for pneumothorax. Left lung remains clear. Left chest pacemaker appears stable. Heart size within normal limits. IMPRESSION: 1. Decreased right pleural effusion following thoracentesis. Negative for pneumothorax. Electronically Signed   By: Juliene Balder M.D.   On: 04/30/2024 16:21   IR THORACENTESIS ASP PLEURAL SPACE W/IMG GUIDE Result Date: 04/30/2024 INDICATION: History of  chronic CHF. Currently admitted with new found right pleural effusion. Request for diagnostic and therapeutic right thoracentesis. EXAM: ULTRASOUND GUIDED DIAGNOSTIC AND THERAPEUTIC RIGHT THORACENTESIS MEDICATIONS: 8 mL 1% lidocaine  COMPLICATIONS: None immediate. PROCEDURE: An ultrasound guided thoracentesis was thoroughly discussed with the patient and questions answered. The benefits, risks, alternatives and complications were also discussed. The patient understands and wishes to proceed with the procedure. Written consent was obtained. Ultrasound was performed to localize and mark an adequate pocket of fluid in the right chest. The area was then prepped and draped in the normal sterile fashion. 1% Lidocaine  was used for local anesthesia. Under ultrasound guidance a 6 Fr Safe-T-Centesis catheter was introduced. Thoracentesis was performed. The catheter was removed and a dressing applied. FINDINGS: A total of approximately 1.1 liters of slightly hazy yellow fluid was removed. Samples were sent to the laboratory as requested by the clinical team. IMPRESSION: Successful ultrasound guided right thoracentesis yielding 1.1 liters of pleural fluid. Performed and dictated by Kimble Clas, PA-C Electronically Signed   By: Cordella Banner   On: 04/30/2024 16:19   DG Chest 2 View Result Date: 04/30/2024 CLINICAL DATA:  Shortness of breath. Atrial fibrillation. Congestive heart failure. EXAM: CHEST - 2 VIEW COMPARISON:  11/07/2023 FINDINGS: New large right pleural effusion is seen, with right lung compressive atelectasis. Left lung remains clear. Heart size is stable. Pacemaker again noted. Stable mediastinal mass causing tracheal deviation to the right, consistent with known goiter as demonstrated on prior neck CT. IMPRESSION: New large right pleural effusion, with right lung compressive atelectasis. Stable mediastinal mass, consistent with known goiter. Electronically Signed   By: Norleen DELENA Kil M.D.   On: 04/30/2024  11:45    Assessment and Plan: 52F h/o CAD (70% RCA stenosis treated medically), Afib, tachy-brady syndrome s/p PPM, HFpEF (EF 60-65% at  [facility] in 11/2023), HTN, DM2, hyperthyroidism, OSA, and most recent admission to [facility] for HFpEF exacerbation treated with IV lasix  40mg  BID who p/w HFpEF exacerbation.  HFpEF exacerbation -Cards consulted; apprec eval/recs (cardiac cath planned for Monday; on hep gtt) -IV lasix  40mg  BID for now; goal net neg 1-2L/d; strict I/Os; daily standing weights; K>4/Mg>2  R pleural effusion -IR consulted and s/p R thoracentesis (culture pending) -Defer IV abx for now -F/u procalcitonin; if elevated, consider CAP coverage abx until culture data results -Consider ID consult pending culture data  Pulm HTN Increased RSVP noted at recent OP Cards visit -PTA Revation 20mg  TID  Hyperthyroidism -PTA methimazole  20mg  daily   Advance Care Planning:   Code Status: Full Code   Consults: N/A  Family Communication: N/A  Severity of Illness: The appropriate patient status for this patient is INPATIENT. Inpatient status is judged to be reasonable and necessary in order to provide the required intensity of service to ensure the patient's safety. The patient's presenting symptoms, physical exam findings, and initial radiographic and laboratory data in the context of their chronic comorbidities is felt to place them at high risk for further clinical deterioration. Furthermore, it is not anticipated that the patient will be medically stable for discharge from the hospital within 2 midnights of admission.   * I certify that at the point of admission it is my clinical judgment that the patient will require inpatient hospital care spanning beyond 2 midnights from the point of admission due to high intensity of service, high risk for further deterioration and high frequency of surveillance required.*   ------- I spent 55 minutes reviewing previous notes, at the bedside  counseling/discussing the treatment plan, and performing clinical documentation.  Author: Marsha Ada, MD 04/30/2024 6:35 PM  For on call review [email or web address].

## 2024-05-01 DIAGNOSIS — J90 Pleural effusion, not elsewhere classified: Secondary | ICD-10-CM | POA: Diagnosis not present

## 2024-05-01 DIAGNOSIS — I5033 Acute on chronic diastolic (congestive) heart failure: Secondary | ICD-10-CM | POA: Diagnosis not present

## 2024-05-01 DIAGNOSIS — I2721 Secondary pulmonary arterial hypertension: Secondary | ICD-10-CM | POA: Diagnosis not present

## 2024-05-01 DIAGNOSIS — I4821 Permanent atrial fibrillation: Secondary | ICD-10-CM | POA: Diagnosis not present

## 2024-05-01 DIAGNOSIS — Z950 Presence of cardiac pacemaker: Secondary | ICD-10-CM

## 2024-05-01 LAB — C-REACTIVE PROTEIN: CRP: 14.6 mg/dL — ABNORMAL HIGH (ref <1.0)

## 2024-05-01 LAB — PROCALCITONIN: Procalcitonin: 0.13 ng/mL

## 2024-05-01 LAB — MAGNESIUM: Magnesium: 1.8 mg/dL (ref 1.7–2.4)

## 2024-05-01 LAB — T4, FREE: Free T4: 1.29 ng/dL — ABNORMAL HIGH (ref 0.61–1.12)

## 2024-05-01 LAB — LACTATE DEHYDROGENASE: LDH: 203 U/L (ref 105–235)

## 2024-05-01 LAB — HEPARIN LEVEL (UNFRACTIONATED)
Heparin Unfractionated: >1.1 [IU]/mL — ABNORMAL HIGH (ref 0.30–0.70)
Heparin Unfractionated: >1.1 [IU]/mL — ABNORMAL HIGH (ref 0.30–0.70)
Heparin Unfractionated: >1.1 [IU]/mL — ABNORMAL HIGH (ref 0.30–0.70)

## 2024-05-01 LAB — TSH: TSH: 1.305 u[IU]/mL (ref 0.350–4.500)

## 2024-05-01 LAB — SEDIMENTATION RATE: Sed Rate: 21 mm/h (ref 0–22)

## 2024-05-01 LAB — APTT
aPTT: 61 s — ABNORMAL HIGH (ref 24–36)
aPTT: 59 s — ABNORMAL HIGH (ref 24–36)
aPTT: 79 s — ABNORMAL HIGH (ref 24–36)

## 2024-05-01 MED ORDER — ONDANSETRON HCL 4 MG/2ML IJ SOLN
4.0000 mg | Freq: Four times a day (QID) | INTRAMUSCULAR | Status: DC | PRN
  Administered 2024-05-01 – 2024-05-03 (×4): 4 mg via INTRAVENOUS
  Filled 2024-05-01 (×4): qty 2

## 2024-05-01 MED ORDER — ACETAMINOPHEN 325 MG PO TABS
650.0000 mg | ORAL_TABLET | Freq: Four times a day (QID) | ORAL | Status: DC | PRN
  Administered 2024-05-01: 650 mg via ORAL
  Filled 2024-05-01: qty 2

## 2024-05-01 MED ORDER — ACETAMINOPHEN 500 MG PO TABS
1000.0000 mg | ORAL_TABLET | Freq: Once | ORAL | Status: AC | PRN
  Administered 2024-05-01: 1000 mg via ORAL
  Filled 2024-05-01: qty 2

## 2024-05-01 MED ORDER — TRAMADOL HCL 50 MG PO TABS: 50.0000 mg | ORAL_TABLET | Freq: Two times a day (BID) | ORAL | Status: DC | PRN

## 2024-05-01 MED ORDER — HEPARIN BOLUS VIA INFUSION
700.0000 [IU] | Freq: Once | INTRAVENOUS | Status: AC
  Administered 2024-05-01: 700 [IU] via INTRAVENOUS
  Filled 2024-05-01: qty 700

## 2024-05-01 MED ORDER — BISACODYL 10 MG RE SUPP
10.0000 mg | Freq: Once | RECTAL | Status: AC
  Administered 2024-05-01: 10 mg via RECTAL
  Filled 2024-05-01: qty 1

## 2024-05-01 MED ORDER — TRAMADOL HCL 50 MG PO TABS
50.0000 mg | ORAL_TABLET | Freq: Once | ORAL | Status: AC
  Administered 2024-05-01: 50 mg via ORAL
  Filled 2024-05-01: qty 1

## 2024-05-01 NOTE — Progress Notes (Signed)
 Progress Note  Patient Name: Hailey [name] Date of Encounter: 05/01/2024 [facility] Cardiologist: Jerel Balding, MD   Interval Summary   Dyspnea has improved somewhat, but not completely.  Still has a cough.  Sleepy this morning.  Denies chest pain.  Has leg cramps  Vital Signs Vitals:   04/30/24 1943 04/30/24 2325 05/01/24 0518 05/01/24 0828  BP: (!) 126/55 112/75 (!) 142/84 136/74  Pulse: 67 79 68 72  Resp: 18 16 16 17   Temp: 97.7 F (36.5 C) 98.2 F (36.8 C) 97.9 F (36.6 C) 98.3 F (36.8 C)  TempSrc: Oral Oral Oral Oral  SpO2: 99% 100% 99% 100%  Weight:      Height:        Intake/Output Summary (Last 24 hours) at 05/01/2024 0857 Last data filed at 05/01/2024 0043 Gross per 24 hour  Intake --  Output 1000 ml  Net -1000 ml      04/30/2024   10:48 AM 04/23/2024    1:10 PM 04/14/2024    3:35 PM  Last 3 Weights  Weight (lbs) 103 lb 103 lb 12.8 oz 105 lb  Weight (kg) 46.72 kg 47.083 kg 47.628 kg      Telemetry/ECG  Atrial fibrillation with controlled ventricular sponsor, occasional ventricular pacing- Personally Reviewed  Physical Exam  GEN: No acute distress.   Neck: 8 cm JVD Cardiac: Irregular rhythm, 2/6 holosystolic murmur heard best at the left lower sternal border, no diastolic murmurs, rubs, or gallops.  Respiratory: Diminished breath sounds and dullness to percussion, bottom two thirds of the right chest GI: Soft, nontender, non-distended  MS: No edema  Assessment & Plan  78 year old woman with severe pulmonary artery hypertension, chronic diastolic heart failure, permanent atrial fibrillation, dual-chamber permanent pacemaker, DM 2, hyperthyroidism (not yet compensated on methimazole , retrosternal goiter) presents with severe shortness of breath and found to have a very large right pleural effusion, without other signs of heart failure exacerbation (clear left lung, BNP and proBNP lower than previous levels, no peripheral edema).  Mildly  hyponatremic, not much changed over the last year.  Normal renal function and potassium level.  Check magnesium  level with next labs.  Elevated WBC this morning, although normal on presentation yesterday.  Afebrile.  Few cells on pleural fluid, but protein and LDH levels were not tested.  Hard to say with certainty whether this is transudate or exudate.  When she was first diagnosed with pulmonary hypertension in 2019 she had a positive ANA with a titer of 1: 640 with a homogenous nuclear pattern, but without other typical antibodies for lupus.  Will repeat serological tests.  Right heart catheterization in 2019 showed minimally elevated left heart filling pressures (wedge pressure 19) with moderate to severe pulmonary hypertension (68/28 with a mean of 43 mmHg, PVR 4.9 Woods units.  Most recent right heart catheterization in February 2024 showed that while there was a component of left heart failure (mean wedge pressure 23), the driver for her pulmonary hypertension was primarily precapillary (PA pressure 73/27, mean 41, transpulmonary gradient 18, PVR 5.6 Wood units).  There was also evidence of low cardiac output with a cardiac index of 2.1 L/min/m.  At that time she was not felt to be a candidate for selective pulmonary artery vasodilators.  She has been on sildenafil  for the last 6 years.  Will ask the heart failure team for their opinion on vasodilators again.    Plan repeat right heart catheterization.  Oral anticoagulants on hold and placed on  IV heparin  in anticipation of right heart catheterization on Monday.   For questions or updates, please contact [facility] Please consult [email or web address] for contact info under         Signed, Jerel Balding, MD

## 2024-05-01 NOTE — Progress Notes (Signed)
 ANTICOAGULATION CONSULT NOTE  Pharmacy Consult for Heparin  Indication: atrial fibrillation  Allergies  Allergen Reactions   Codeine Nausea And Vomiting and Nausea Only   Ketorolac  Nausea And Vomiting, Nausea Only and Other (See Comments)    pt told to never take this med   Meperidine  Nausea Only and Nausea And Vomiting   Multaq  [Dronedarone ] Other (See Comments)    Increased fluid retention   Xarelto [Rivaroxaban] Diarrhea   Flexeril  [Cyclobenzaprine ] Nausea And Vomiting   Latex Rash    Pt stated on 09/20/2022    Patient Measurements: Height: 4' 11 (149.9 cm) Weight: 46.7 kg (103 lb) IBW/kg (Calculated) : 43.2 Heparin  Dosing Weight: 46.7 kg  Vital Signs: Temp: 97.9 F (36.6 C) (11/29 0518) Temp Source: Oral (11/29 0518) BP: 142/84 (11/29 0518) Pulse Rate: 68 (11/29 0518)  Labs: Recent Labs    04/30/24 1154 04/30/24 1426 04/30/24 1830 05/01/24 0439  HGB 12.8  --  13.4  --   HCT 41.9  --  42.9  --   PLT 432*  --  415*  --   APTT  --   --  36 61*  HEPARINUNFRC  --   --  >1.10* >1.10*  CREATININE 1.00 0.98  --   --   TROPONINIHS 7 7  --   --     Estimated Creatinine Clearance: 31.7 mL/min (by C-G formula based on SCr of 0.98 mg/dL).   Medical History: Past Medical History:  Diagnosis Date   Acute on chronic diastolic CHF (congestive heart failure), NYHA class 1 (HCC) 04/29/2013   Allergic rhinitis    Brady-tachy syndrome (HCC)    s/p STJ dual chamber PPM by Dr Francyne   CAD (coronary artery disease)    70% RCA stenosis, treated medically   Control of atrial fibrillation with pacemaker (HCC)    Diabetes mellitus (HCC)    type 2   DJD (degenerative joint disease)    Dyspnea    occasional   Hearing loss    Right ear only - no hearing aids   Hypertension    Hyperthyroidism    Paroxysmal atrial fibrillation (HCC)    Pacemaker St Jude   Pneumonia    years ago- before 2012 per patient   Polio    as a child   PONV (postoperative nausea and vomiting)     Presence of permanent cardiac pacemaker    St Jude pacemaker   Pulmonary disease    Sleep apnea    uses  CPAP nightly    Medications:  Medications Prior to Admission  Medication Sig Dispense Refill Last Dose/Taking   acetaminophen  (TYLENOL ) 500 MG tablet Take 500-1,000 mg by mouth every 6 (six) hours as needed (pain.).   Past Week   albuterol  (VENTOLIN  HFA) 108 (90 Base) MCG/ACT inhaler Inhale 2 puffs into the lungs every 6 (six) hours as needed for wheezing or shortness of breath. 8 g 1 Unknown   Cholecalciferol  (VITAMIN D3) 1000 UNITS CAPS Take 1,000 Units by mouth in the morning.   04/29/2024   ELIQUIS  5 MG TABS tablet TAKE ONE TABLET BY MOUTH TWICE DAILY 60 tablet 5 04/29/2024 Bedtime   fluticasone  (FLONASE ) 50 MCG/ACT nasal spray Place 2 sprays into both nostrils daily as needed for allergies or rhinitis. 16 g 6 Past Week   furosemide  (LASIX ) 40 MG tablet Take 2 tablets (80 mg total) by mouth 2 (two) times daily. 180 tablet 3 04/29/2024 Evening   loperamide  (IMODIUM  A-D) 2 MG tablet Take 2 mg by  mouth. (Patient taking differently: Take 2 mg by mouth as needed for diarrhea or loose stools.)   Taking Differently   loratadine  (CLARITIN ) 10 MG tablet Take 1 tablet (10 mg total) by mouth daily. 30 tablet 6 Past Week   methimazole  (TAPAZOLE ) 10 MG tablet Take 20 mg (two tablets) daily.   04/29/2024   metoprolol  tartrate (LOPRESSOR ) 25 MG tablet Take 12.5 mg by mouth in the morning and at bedtime.   04/29/2024   ondansetron  (ZOFRAN ) 4 MG tablet TAKE ONE TABLET BY MOUTH EVERY EIGHT HOURS AS NEEDED FOR NAUSEA OR VOMITING 20 tablet 0 Past Month   pantoprazole  (PROTONIX ) 40 MG tablet TAKE ONE TABLET BY MOUTH TWICE DAILY BEFORE A MEAL 60 tablet 5 04/29/2024   potassium chloride  SA (KLOR-CON  M) 20 MEQ tablet Take 1 tablet (20 mEq total) by mouth 3 (three) times daily. 270 tablet 3 04/29/2024   sildenafil  (REVATIO ) 20 MG tablet TAKE ONE TABLET BY MOUTH 3 TIMES DAILY 270 tablet 3 04/29/2024   sodium  chloride (NASAL MOISTURIZING SPRAY) 0.65 % nasal spray Place 1 spray into the nose as needed for congestion.   Taking As Needed   spironolactone  (ALDACTONE ) 25 MG tablet TAKE ONE TABLET BY MOUTH EVERY DAY 90 tablet 3 04/29/2024   tizanidine (ZANAFLEX) 2 MG capsule Take 2 mg by mouth at bedtime.   04/29/2024   traMADol  (ULTRAM ) 50 MG tablet Take 50-100 mg by mouth every 8 (eight) hours as needed for moderate pain (pain score 4-6).   Taking As Needed   Scheduled:   Chlorhexidine  Gluconate Cloth  6 each Topical Daily   feeding supplement  237 mL Oral BID BM   furosemide   40 mg Intravenous BID   lidocaine -EPINEPHrine   20 mL Intradermal Once   methimazole   20 mg Oral Daily   pantoprazole   40 mg Oral Daily   sildenafil   20 mg Oral TID   Infusions:   heparin  650 Units/hr (04/30/24 2123)   PRN:   Assessment: 37 yof with a history of CAD, HF, AF on eliquis , tachybradycardia syndrome s/p PPM, HTN, T2DM. Patient is presenting with SOB. Heparin  per pharmacy consult placed for atrial fibrillation. Cards planning for possible RHC 12/1.  S/p IR-guided thoracentesis 11/28 for rt sided pleural effusion  Patient is on apixaban  prior to arrival. Last dose 11/27 at bedtime per medication history. Will require aPTT monitoring due to likely falsely high anti-Xa level secondary to DOAC use.  Hgb 12.8; plt 432  11/29 AM update:  aPTT sub-therapeutic   Goal of Therapy:  Heparin  level 0.3-0.7 units/ml aPTT 66-102 seconds Monitor platelets by anticoagulation protocol: Yes   Plan:  Inc heparin  slightly to 700 units/hr Check aPTT & anti-Xa level in 8 hours and daily while on heparin  Continue to monitor via aPTT until levels are correlated Continue to monitor H&H and platelets  Lynwood Mckusick, PharmD, BCPS Clinical Pharmacist Phone: [phone]

## 2024-05-01 NOTE — Progress Notes (Signed)
 ANTICOAGULATION CONSULT NOTE  Pharmacy Consult for Heparin  Indication: atrial fibrillation  Allergies  Allergen Reactions   Codeine Nausea And Vomiting and Nausea Only   Ketorolac  Nausea And Vomiting, Nausea Only and Other (See Comments)    pt told to never take this med   Meperidine  Nausea Only and Nausea And Vomiting   Multaq  [Dronedarone ] Other (See Comments)    Increased fluid retention   Xarelto [Rivaroxaban] Diarrhea   Flexeril  [Cyclobenzaprine ] Nausea And Vomiting   Latex Rash    Pt stated on 09/20/2022    Patient Measurements: Height: 4' 11 (149.9 cm) Weight: 46.7 kg (103 lb) IBW/kg (Calculated) : 43.2 Heparin  Dosing Weight: 46.7 kg  Vital Signs: Temp: 98.2 F (36.8 C) (11/29 1320) Temp Source: Oral (11/29 1320) BP: 143/69 (11/29 1320) Pulse Rate: 144 (11/29 1320)  Labs: Recent Labs    04/30/24 1154 04/30/24 1426 04/30/24 1830 05/01/24 0439 05/01/24 1358  HGB 12.8  --  13.4  --   --   HCT 41.9  --  42.9  --   --   PLT 432*  --  415*  --   --   APTT  --   --  36 61* 59*  HEPARINUNFRC  --   --  >1.10* >1.10* >1.10*  CREATININE 1.00 0.98  --   --   --   TROPONINIHS 7 7  --   --   --     Estimated Creatinine Clearance: 31.7 mL/min (by C-G formula based on SCr of 0.98 mg/dL).   Medical History: Past Medical History:  Diagnosis Date   Acute on chronic diastolic CHF (congestive heart failure), NYHA class 1 (HCC) 04/29/2013   Allergic rhinitis    Brady-tachy syndrome (HCC)    s/p STJ dual chamber PPM by Dr Francyne   CAD (coronary artery disease)    70% RCA stenosis, treated medically   Control of atrial fibrillation with pacemaker (HCC)    Diabetes mellitus (HCC)    type 2   DJD (degenerative joint disease)    Dyspnea    occasional   Hearing loss    Right ear only - no hearing aids   Hypertension    Hyperthyroidism    Paroxysmal atrial fibrillation (HCC)    Pacemaker St Jude   Pneumonia    years ago- before 2012 per patient   Polio    as  a child   PONV (postoperative nausea and vomiting)    Presence of permanent cardiac pacemaker    St Jude pacemaker   Pulmonary disease    Sleep apnea    uses  CPAP nightly    Medications:  Medications Prior to Admission  Medication Sig Dispense Refill Last Dose/Taking   acetaminophen  (TYLENOL ) 500 MG tablet Take 500-1,000 mg by mouth every 6 (six) hours as needed (pain.).   Past Week   albuterol  (VENTOLIN  HFA) 108 (90 Base) MCG/ACT inhaler Inhale 2 puffs into the lungs every 6 (six) hours as needed for wheezing or shortness of breath. 8 g 1 Unknown   Cholecalciferol  (VITAMIN D3) 1000 UNITS CAPS Take 1,000 Units by mouth in the morning.   04/29/2024   ELIQUIS  5 MG TABS tablet TAKE ONE TABLET BY MOUTH TWICE DAILY 60 tablet 5 04/29/2024 Bedtime   fluticasone  (FLONASE ) 50 MCG/ACT nasal spray Place 2 sprays into both nostrils daily as needed for allergies or rhinitis. 16 g 6 Past Week   furosemide  (LASIX ) 40 MG tablet Take 2 tablets (80 mg total) by mouth 2 (two)  times daily. 180 tablet 3 04/29/2024 Evening   loperamide  (IMODIUM  A-D) 2 MG tablet Take 2 mg by mouth. (Patient taking differently: Take 2 mg by mouth as needed for diarrhea or loose stools.)   Taking Differently   loratadine  (CLARITIN ) 10 MG tablet Take 1 tablet (10 mg total) by mouth daily. 30 tablet 6 Past Week   methimazole  (TAPAZOLE ) 10 MG tablet Take 20 mg (two tablets) daily.   04/29/2024   metoprolol  tartrate (LOPRESSOR ) 25 MG tablet Take 12.5 mg by mouth in the morning and at bedtime.   04/29/2024   ondansetron  (ZOFRAN ) 4 MG tablet TAKE ONE TABLET BY MOUTH EVERY EIGHT HOURS AS NEEDED FOR NAUSEA OR VOMITING 20 tablet 0 Past Month   pantoprazole  (PROTONIX ) 40 MG tablet TAKE ONE TABLET BY MOUTH TWICE DAILY BEFORE A MEAL 60 tablet 5 04/29/2024   potassium chloride  SA (KLOR-CON  M) 20 MEQ tablet Take 1 tablet (20 mEq total) by mouth 3 (three) times daily. 270 tablet 3 04/29/2024   sildenafil  (REVATIO ) 20 MG tablet TAKE ONE TABLET BY  MOUTH 3 TIMES DAILY 270 tablet 3 04/29/2024   sodium chloride  (NASAL MOISTURIZING SPRAY) 0.65 % nasal spray Place 1 spray into the nose as needed for congestion.   Taking As Needed   spironolactone  (ALDACTONE ) 25 MG tablet TAKE ONE TABLET BY MOUTH EVERY DAY 90 tablet 3 04/29/2024   tizanidine (ZANAFLEX) 2 MG capsule Take 2 mg by mouth at bedtime.   04/29/2024   traMADol  (ULTRAM ) 50 MG tablet Take 50-100 mg by mouth every 8 (eight) hours as needed for moderate pain (pain score 4-6).   Taking As Needed   Scheduled:   Chlorhexidine  Gluconate Cloth  6 each Topical Daily   feeding supplement  237 mL Oral BID BM   furosemide   40 mg Intravenous BID   lidocaine -EPINEPHrine   20 mL Intradermal Once   methimazole   20 mg Oral Daily   pantoprazole   40 mg Oral Daily   sildenafil   20 mg Oral TID   Infusions:   heparin  700 Units/hr (05/01/24 0603)   PRN: ondansetron  (ZOFRAN ) IV  Assessment: 39 yof with a history of CAD, HF, AF on eliquis , tachybradycardia syndrome s/p PPM, HTN, T2DM. Patient is presenting with SOB. Heparin  per pharmacy consult placed for atrial fibrillation. Cards planning for possible RHC 12/1.  S/p IR-guided thoracentesis 11/28 for rt sided pleural effusion  Patient is on apixaban  prior to arrival. Last dose 11/27 at bedtime per medication history. Will require aPTT monitoring due to likely falsely high anti-Xa level secondary to DOAC use.  Hgb 12.8; plt 432  11/29 PM update:  aPTT sub-therapeutic at 59 on a rate of 700 units/hr. Per RN, patient was moving her arm and heparin  wasn't running properly, has since repositioned patient and is not having any issues currently  Goal of Therapy:  Heparin  level 0.3-0.7 units/ml aPTT 66-102 seconds Monitor platelets by anticoagulation protocol: Yes   Plan:  Bolus 700 units x 1 Inc heparin  to 800 units/hr Check aPTT & anti-Xa level in 8 hours and daily while on heparin  Continue to monitor via aPTT until levels are correlated Continue  to monitor H&H and platelets  R. Samual Satterfield, PharmD PGY-1 Acute Care Pharmacy Resident [facility] Please refer to [facility] for [facility] numbers 05/01/2024 2:52 PM

## 2024-05-01 NOTE — Evaluation (Signed)
 Clinical/Bedside Swallow Evaluation Patient Details  Name: Hailey [name] MRN: [id number] Date of Birth: [birth year]  Today's Date: 05/01/2024 Time: SLP Start Time (ACUTE ONLY): 1007 SLP Stop Time (ACUTE ONLY): 1037 SLP Time Calculation (min) (ACUTE ONLY): 30 min  Past Medical History:  Past Medical History:  Diagnosis Date   Acute on chronic diastolic CHF (congestive heart failure), NYHA class 1 (HCC) 04/29/2013   Allergic rhinitis    Brady-tachy syndrome (HCC)    s/p STJ dual chamber PPM by Dr Francyne   CAD (coronary artery disease)    70% RCA stenosis, treated medically   Control of atrial fibrillation with pacemaker (HCC)    Diabetes mellitus (HCC)    type 2   DJD (degenerative joint disease)    Dyspnea    occasional   Hearing loss    Right ear only - no hearing aids   Hypertension    Hyperthyroidism    Paroxysmal atrial fibrillation (HCC)    Pacemaker St Jude   Pneumonia    years ago- before 2012 per patient   Polio    as a child   PONV (postoperative nausea and vomiting)    Presence of permanent cardiac pacemaker    St Jude pacemaker   Pulmonary disease    Sleep apnea    uses  CPAP nightly   Past Surgical History:  Past Surgical History:  Procedure Laterality Date   arm surgery d/t fx Right 1996   ATRIAL FIBRILLATION ABLATION  07/31/2012; 08-10-13   PVI by Dr Kelsie   ATRIAL FIBRILLATION ABLATION N/A 07/31/2012   Procedure: ATRIAL FIBRILLATION ABLATION;  Surgeon: Lynwood Kelsie, MD;  Location: [facility] CATH LAB;  Service: Cardiovascular;  Laterality: N/A;   ATRIAL FIBRILLATION ABLATION N/A 08/10/2013   Procedure: ATRIAL FIBRILLATION ABLATION;  Surgeon: Lynwood JONETTA Kelsie, MD;  Location: [facility] CATH LAB;  Service: Cardiovascular;  Laterality: N/A;   BACK SURGERY  07/26/2012   dR. nUDELMAN   BIOPSY  06/30/2023   Procedure: BIOPSY;  Surgeon: Cindie Carlin POUR, DO;  Location: [facility] ENDO SUITE;  Service: Endoscopy;;   CARDIAC CATHETERIZATION  12/03/2010   single vessel mid RCA    CARDIAC ELECTROPHYSIOLOGY MAPPING AND ABLATION  07/31/2012   Dr. Lynwood Kelsie   CESAREAN SECTION  09/20/1974   Dr. Lonni   CHOLECYSTECTOMY  1994   COLONOSCOPY WITH PROPOFOL  N/A 03/21/2015   Procedure: COLONOSCOPY WITH PROPOFOL ;  Surgeon: Renaye Sous, MD;  Location: [facility] ENDOSCOPY;  Service: Endoscopy;  Laterality: N/A;   COLONOSCOPY WITH PROPOFOL  N/A 06/30/2023   Procedure: COLONOSCOPY WITH PROPOFOL ;  Surgeon: Cindie Carlin POUR, DO;  Location: [facility] ENDO SUITE;  Service: Endoscopy;  Laterality: N/A;  11:15 AM, ASA 4   DIAGNOSTIC MAMMOGRAM  04/16/2017   Duwaine HERO. Morris, DO   ESOPHAGOGASTRODUODENOSCOPY (EGD) WITH PROPOFOL  N/A 06/30/2023   Procedure: ESOPHAGOGASTRODUODENOSCOPY (EGD) WITH PROPOFOL ;  Surgeon: Cindie Carlin POUR, DO;  Location: [facility] ENDO SUITE;  Service: Endoscopy;  Laterality: N/A;  11:15 AM, ASA 4   EYE SURGERY Bilateral    remove cataracts   IR THORACENTESIS ASP PLEURAL SPACE W/IMG GUIDE  04/30/2024   LAPAROSCOPIC HYSTERECTOMY  Late 70s to early 80s   LEFT HEART CATH AND CORONARY ANGIOGRAPHY N/A 07/30/2017   Procedure: LEFT HEART CATH AND CORONARY ANGIOGRAPHY;  Surgeon: Burnard Debby LABOR, MD;  Location: [facility] INVASIVE CV LAB;  Service: Cardiovascular;  Laterality: N/A;   LUMBAR LAMINECTOMY/DECOMPRESSION MICRODISCECTOMY Right 12/23/2012   Procedure: Right L5-S1 Laminotomy for resection of synovial cyst;  Surgeon: Lamar LELON Peaches,  MD;  Location: [facility] NEURO ORS;  Service: Neurosurgery;  Laterality: Right;  Right Lumbar five-sacral one Laminotomy for resection of synovial cyst   NM MYOCAR PERF WALL MOTION  12/20/2010   normal   PACEMAKER IMPLANT N/A 08/09/2019   Procedure: PACEMAKER IMPLANT;  Surgeon: Francyne Headland, MD;  Location: [facility] INVASIVE CV LAB;  Service: Cardiovascular;  Laterality: N/A;   PACEMAKER INSERTION  12/03/2010   SJM Accent DR RF implanted by Dr Francyne   POLYPECTOMY  06/30/2023   Procedure: POLYPECTOMY;  Surgeon: Cindie Carlin POUR, DO;  Location: [facility] ENDO SUITE;  Service:  Endoscopy;;   RIGHT HEART CATH N/A 04/22/2018   Procedure: RIGHT HEART CATH;  Surgeon: Darron Deatrice LABOR, MD;  Location: [facility] INVASIVE CV LAB;  Service: Cardiovascular;  Laterality: N/A;   RIGHT HEART CATH N/A 07/09/2022   Procedure: RIGHT HEART CATH;  Surgeon: Cherrie Toribio SAUNDERS, MD;  Location: [facility] INVASIVE CV LAB;  Service: Cardiovascular;  Laterality: N/A;   TEE WITHOUT CARDIOVERSION N/A 07/30/2012   Procedure: TRANSESOPHAGEAL ECHOCARDIOGRAM (TEE);  Surgeon: Headland Francyne, MD;  Location: [facility] ENDOSCOPY;  Service: Cardiovascular;  Laterality: N/A;  h&p in file-hope   TEE WITHOUT CARDIOVERSION N/A 08/09/2013   Procedure: TRANSESOPHAGEAL ECHOCARDIOGRAM (TEE);  Surgeon: Redell GORMAN Shallow, MD;  Location: [facility] ENDOSCOPY;  Service: Cardiovascular;  Laterality: N/A;   THYROID  LOBECTOMY Right 09/20/2022   Procedure: RIGHT HEMITHYROIDECTOMY;  Surgeon: Llewellyn Gerard LABOR, DO;  Location: [facility] OR;  Service: ENT;  Laterality: Right;   TOTAL ABDOMINAL HYSTERECTOMY  1981   ULTRASOUND GUIDANCE FOR VASCULAR ACCESS  04/22/2018   Procedure: Ultrasound Guidance For Vascular Access;  Surgeon: Darron Deatrice LABOR, MD;  Location: [facility] INVASIVE CV LAB;  Service: Cardiovascular;;   HPI:  Patient is a 78 y.o. female who presented to [facility] on 04/30/24 from home with SOB. She was admitted with progressive dyspnea and pleural effusion, suspected to be acute on chronic diastolic CHF exacerbation with pulmonary hypertension. Cardiology consulted. CXR on 11/28 showed new large right pleural effusion, with right lung compressive atelectasis but left lung clear. She underwent right thoracentesis yielding 1.1 liters of pleural fluid and repeat CXR showed decreased right pleural effusion. SLP swallow evaluation ordered on 11/29 due to patient's c/o difficulty swallowing pills and some solid foods. PMH: CAD, a-fib, HFpEF, HTN, DM-2, hyperthyroidism s/p right hemithyroidectomy 09/20/2022, 2023 barium esophagram indicating Mild esophageal deviation to  the right and mild circumferential narrowing of the esophageal lumen in the lower neck by the patient's multinodular goiter, OSA, hearing loss.    Assessment / Plan / Recommendation  Clinical Impression  Patient presents with clinical s/s of dysphagia as per this bedside swallow evaluation. Current diet order per chart is regular solids, thin liquids however patient reported that she has only been drinking liquids and some purees like applesauce as she has been nauseated. Last solid texture PO was Wednesday per her report. SLP assessed patient's swallow with thin liquids and puree solids. With both PO's, she exhibited effortful swallows but no overt s/s aspiration. She indicated that her swallow declined following right hemithyroidectomy in April of 2024. SLP suspects patient's dysphagia is chronic versus acute on chronic. SLP recommending continue current diet as tolerated and proceed with MBS when able to be scheduled. SLP Visit Diagnosis: Dysphagia, unspecified (R13.10)    Aspiration Risk  Mild aspiration risk    Diet Recommendation Other (Comment) (continue PO diet as tolerated per patient)    Liquid Administration via: Straw;Cup Medication Administration: Other (Comment) (as tolerated) Supervision: Patient able  to self feed Compensations: Slow rate;Small sips/bites Postural Changes: Seated upright at 90 degrees;Remain upright for at least 30 minutes after po intake    Other  Recommendations Oral Care Recommendations: Oral care BID     Assistance Recommended at Discharge    Functional Status Assessment Patient has had a recent decline in their functional status and demonstrates the ability to make significant improvements in function in a reasonable and predictable amount of time.  Frequency and Duration min 1 x/week  1 week       Prognosis Prognosis for improved oropharyngeal function: Good      Swallow Study   General Date of Onset: 05/01/24 HPI: Patient is a 78 y.o. female  who presented to [facility] on 04/30/24 from home with SOB. She was admitted with progressive dyspnea and pleural effusion, suspected to be acute on chronic diastolic CHF exacerbation with pulmonary hypertension. Cardiology consulted. CXR on 11/28 showed new large right pleural effusion, with right lung compressive atelectasis but left lung clear. She underwent right thoracentesis yielding 1.1 liters of pleural fluid and repeat CXR showed decreased right pleural effusion. SLP swallow evaluation ordered on 11/29 due to patient's c/o difficulty swallowing pills and some solid foods. PMH: CAD, a-fib, HFpEF, HTN, DM-2, hyperthyroidism s/p right hemithyroidectomy 09/20/2022, 2023 barium esophagram indicating Mild esophageal deviation to the right and mild circumferential narrowing of the esophageal lumen in the lower neck by the patient's multinodular goiter, OSA, hearing loss. Type of Study: Bedside Swallow Evaluation Previous Swallow Assessment: no SLP swallow evaluation, 2023 barium esophagram Diet Prior to this Study: Regular;Thin liquids (Level 0) Temperature Spikes Noted: No Respiratory Status: Room air History of Recent Intubation: No Behavior/Cognition: Alert;Cooperative;Pleasant mood Oral Cavity Assessment: Within Functional Limits Oral Care Completed by SLP: No Oral Cavity - Dentition: Adequate natural dentition Vision: Functional for self-feeding Self-Feeding Abilities: Able to feed self Patient Positioning: Upright in bed Baseline Vocal Quality: Normal Volitional Swallow: Able to elicit    Oral/Motor/Sensory Function Overall Oral Motor/Sensory Function: Within functional limits   Ice Chips     Thin Liquid Thin Liquid: Impaired Presentation: Straw;Self Fed Pharyngeal  Phase Impairments: Other (comments) Other Comments: effortful swallows with liquids    Nectar Thick     Honey Thick     Puree Puree: Impaired Pharyngeal Phase Impairments: Other (comments) Other Comments: effortful swallows  with applesauce   Solid     Solid: Not tested     Norleen IVAR Blase, MA, CCC-SLP Speech Therapy

## 2024-05-01 NOTE — Progress Notes (Signed)
 PROGRESS NOTE Hailey [name]  FMW:[id number] DOB: [birth year] DOA: 04/30/2024 PCP: [name], NP  Brief Narrative/Hospital Course: Hailey [name] is a 78 y.o. female with PMH of t of CAD (70% RCA stenosis treated medically), Afib, tachy-brady syndrome s/p PPM, HFpEF (EF 60-65% at [facility] in 11/2023), HTN, DM2, hyperthyroidism, OSA, and most recent admission to [facility] for HFpEF exacerbation treated with IV lasix  40mg  BID who presents with shortness of breath progressively worsening past week or so and also, urinary retention In the ED: tachypneic on 2-3L [place]. Labs notable for BNP 151. CXR w/ R pleural effusion. IR consulted for R thoracentesis (s/p 1.1L hazy yellow fluid removed) EDP consulted Cards and requested medicine admission.  Subjective: Seen and examined today Overall feeling better still on nasal cannula 3 L at home not on oxygen Overnight afebrile, VSS, Labs Pro-Cal 0.13, leukocytosis 9>17> pending labs  Assessment and plan:  Worsening dyspnea Right pleural effusion Acute on chronic diastolic CHF exacerbation Mixed pulmonary hypertension-mixed PAH/pulmonary venous hypertension Acute hypoxic respiratory failure: Presented with progressive dyspnea and workup with pleural effusion , suspecting acute on chronic diastolic CHF exacerbation with pulmonary hypertension.  Cardiology has been consulted underwent thoracentesis with 1.1 L yellow fluid removal-Gram stain no organism, cytology/culture pending Pleural fluid -Total nucleated cells 738 with neutrophil 58%.  LDH albumin  not sent- ordered this am as an add on Continue cautious diuretics, monitor respiratory status, pleural effusion  Noted plan for right heart cath-and advanced heart failure team evaluation. On sildenafil  20 3 times daily for PHTN Cont to monitor daily I/O,weight, electrolytes and net balance as below.Keep on  salt/fluid restricted diet and monitor in tele. Net IO Since Admission: -1,000 mL [05/01/24  1114]  Filed Weights   04/30/24 1048  Weight: 46.7 kg    Recent Labs  Lab 04/28/24 1548 04/30/24 1154 04/30/24 1155 04/30/24 1426 05/01/24 0935  BNP  --   --  151.2*  --   --   PROBNP 2,352*  --   --   --   --   BUN  --  13  --   --   --   CREATININE  --  1.00  --  0.98  --   K  --  4.3  --   --   --   MG  --   --   --   --  1.8    Permanent A-fib Tachybradycardia syndrome status post PPM: Patient is predominantly V-paced.  Eliquis  held and currently on heparin . Home metoprolol  12.5,aldactone   on hold.  Monitor on telemetry  Leukocytosis: Recheck CBC no fever.  Monitor  CAD-nonobstructive: At bedtime troponin unremarkable not on aspirin  due to DOAC, not on a statin due to intolerance  Hyperthyroidism Goiter: On methimazole .  Last TSH 0.07, free T40.9 Normal on 11/3. No changes  T2DM: Add ssi No results for input(s): GLUCAP, HGBA1C in the last 168 hours.   Constipation: No BM x 3 days PTA.  X-ray unremarkable continue laxatives  DVT prophylaxis: Heparin  drip Code Status:   Code Status: Full Code Family Communication: plan of care discussed with patient at bedside. Patient status is: Remains hospitalized because of severity of illness Level of care: Telemetry   Dispo: The patient is from: home with husband and family            Anticipated disposition: TBD Objective: Vitals last 24 hrs: Vitals:   04/30/24 1943 04/30/24 2325 05/01/24 0518 05/01/24 0828  BP: (!) 126/55 112/75 (!) 142/84 136/74  Pulse: 67 79 68 72  Resp: 18 16 16 17   Temp: 97.7 F (36.5 C) 98.2 F (36.8 C) 97.9 F (36.6 C) 98.3 F (36.8 C)  TempSrc: Oral Oral Oral Oral  SpO2: 99% 100% 99% 100%  Weight:      Height:        Physical Examination: General exam: alert awake, oriented, older than stated age HEENT:Oral mucosa moist, Ear/Nose WNL grossly Respiratory system: Bilaterally diminished BS with dullness on percussion on the bottom posterior Cardiovascular system: S1 & S2 +, No  JVD. Gastrointestinal system: Abdomen soft,NT,ND, BS+ Nervous System: Alert, awake, moving all extremities,and following commands. Extremities: extremities warm, leg edema NEG Skin: Warm, no rashes MSK: Normal muscle bulk,tone, power   Medications reviewed:  Scheduled Meds:  bisacodyl   10 mg Rectal Once   Chlorhexidine  Gluconate Cloth  6 each Topical Daily   feeding supplement  237 mL Oral BID BM   furosemide   40 mg Intravenous BID   lidocaine -EPINEPHrine   20 mL Intradermal Once   methimazole   20 mg Oral Daily   pantoprazole   40 mg Oral Daily   sildenafil   20 mg Oral TID   Continuous Infusions:  heparin  700 Units/hr (05/01/24 0603)   Diet: Diet Order             Diet 2 gram sodium Room service appropriate? Yes; Fluid consistency: Thin  Diet effective now                    Data Reviewed: I have personally reviewed following labs and imaging studies ( see epic result tab) CBC: Recent Labs  Lab 04/30/24 1154 04/30/24 1830  WBC 9.3 17.2*  HGB 12.8 13.4  HCT 41.9 42.9  MCV 84.0 82.3  PLT 432* 415*   CMP: Recent Labs  Lab 04/30/24 1154 04/30/24 1426 05/01/24 0935  NA 131*  --   --   K 4.3  --   --   CL 91*  --   --   CO2 28  --   --   GLUCOSE 132*  --   --   BUN 13  --   --   CREATININE 1.00 0.98  --   CALCIUM  9.2  --   --   MG  --   --  1.8   GFR: Estimated Creatinine Clearance: 31.7 mL/min (by C-G formula based on SCr of 0.98 mg/dL). No results for input(s): AST, ALT, ALKPHOS, BILITOT, PROT, ALBUMIN  in the last 168 hours. No results for input(s): LIPASE, AMYLASE in the last 168 hours. No results for input(s): AMMONIA in the last 168 hours. Coagulation Profile: No results for input(s): INR, PROTIME in the last 168 hours. Unresulted Labs (From admission, onward)     Start     Ordered   05/07/24 0500  Creatinine, serum  (enoxaparin  (LOVENOX )    CrCl >/= 30 ml/min)  Weekly,   R     Comments: while on enoxaparin  therapy    04/30/24  1503   05/02/24 0500  APTT  Daily,   R      04/30/24 1722   05/02/24 0500  Heparin  level (unfractionated)  Daily,   R      04/30/24 1722   05/02/24 0500  Basic metabolic panel with GFR  Daily,   R      05/01/24 0842   05/02/24 0500  CBC  Daily,   R      05/01/24 0842   05/01/24 1400  Heparin  level (unfractionated)  Once-Timed,   TIMED        05/01/24 0555   05/01/24 1400  APTT  Once-Timed,   TIMED        05/01/24 0555   05/01/24 0912  Hemoglobin A1c  Once,   R        05/01/24 0911   05/01/24 0910  ANA, IFA (with reflex)  Once,   R        05/01/24 0910   05/01/24 0910  C3 complement  Once,   R        05/01/24 0910   05/01/24 0843  Albumin , pleural or peritoneal fluid   (Thoracentesis Labs Panel)  Add-on,   AD        05/01/24 0842   05/01/24 0843  Protein, pleural or peritoneal fluid  (Thoracentesis Labs Panel)  Add-on,   AD        05/01/24 0842   05/01/24 0842  Lactate dehydrogenase (pleural or peritoneal fluid)  (Thoracentesis Labs Panel)  Add-on,   AD        05/01/24 0842   04/30/24 1555  Miscellaneous [facility] test (send-out)  RELEASE UPON ORDERING,   STAT        04/30/24 1555           Antimicrobials/Microbiology: Anti-infectives (From admission, onward)    None         Component Value Date/Time   SDES PLEURAL 04/30/2024 1554   SDES PLEURAL 04/30/2024 1554   SPECREQUEST RIGHT LUNG 04/30/2024 1554   SPECREQUEST RIGHT LUNG 04/30/2024 1554   CULT  04/30/2024 1554    NO GROWTH < 24 HOURS Performed at [facility], [address] [place], [place] [address]    REPTSTATUS PENDING 04/30/2024 1554   REPTSTATUS 04/30/2024 FINAL 04/30/2024 1554    Procedures:    Mennie LAMY, MD [facility] 05/01/2024, 11:15 AM

## 2024-05-01 NOTE — Progress Notes (Signed)
 ANTICOAGULATION CONSULT NOTE Pharmacy Consult for Heparin  Indication: atrial fibrillation Brief A/P: aPTT within goal range Continue Heparin  at current rate   Allergies  Allergen Reactions   Codeine Nausea And Vomiting and Nausea Only   Ketorolac  Nausea And Vomiting, Nausea Only and Other (See Comments)    pt told to never take this med   Meperidine  Nausea Only and Nausea And Vomiting   Multaq  [Dronedarone ] Other (See Comments)    Increased fluid retention   Xarelto [Rivaroxaban] Diarrhea   Flexeril  [Cyclobenzaprine ] Nausea And Vomiting   Latex Rash    Pt stated on 09/20/2022    Patient Measurements: Height: 4' 11 (149.9 cm) Weight: 46.7 kg (103 lb) IBW/kg (Calculated) : 43.2 Heparin  Dosing Weight: 46.7 kg  Vital Signs: Temp: 97.7 F (36.5 C) (11/29 2110) Temp Source: Oral (11/29 2110) BP: 137/76 (11/29 2110) Pulse Rate: 87 (11/29 2110)  Labs: Recent Labs    04/30/24 1154 04/30/24 1426 04/30/24 1830 04/30/24 1830 05/01/24 0439 05/01/24 1358 05/01/24 2241  HGB 12.8  --  13.4  --   --   --   --   HCT 41.9  --  42.9  --   --   --   --   PLT 432*  --  415*  --   --   --   --   APTT  --   --  36   < > 61* 59* 79*  HEPARINUNFRC  --   --  >1.10*   < > >1.10* >1.10* >1.10*  CREATININE 1.00 0.98  --   --   --   --   --   TROPONINIHS 7 7  --   --   --   --   --    < > = values in this interval not displayed.    Estimated Creatinine Clearance: 31.7 mL/min (by C-G formula based on SCr of 0.98 mg/dL).   Assessment: 78 y.o. female with h/o Afib, Eliquis  on hold, for heparin   Goal of Therapy:  Heparin  level 0.3-0.7 units/ml aPTT 66-102 seconds Monitor platelets by anticoagulation protocol: Yes   Plan:  No change to heparin   Cathlyn Arrant, PharmD, BCPS  05/01/2024 11:37 PM

## 2024-05-01 NOTE — Hospital Course (Addendum)
 Hailey [name] is a 78 y.o. female with PMH of t of CAD (70% RCA stenosis treated medically), Afib, tachy-brady syndrome s/p PPM, HFpEF (EF 60-65% at [facility] in 11/2023), HTN, DM2, hyperthyroidism, OSA, and most recent admission to [facility] for HFpEF exacerbation treated with IV lasix  40mg  BID who presents with shortness of breath progressively worsening past week or so and also, urinary retention Underwent thoracentesis x 2. Underwent right heart cath as well.   Cardiology following.  Assessment and plan: Acute on chronic diastolic CHF. Mixed pulmonary hypertension-mixed PAH/pulmonary venous hypertension Acute hypoxic respiratory failure: Presents with complaints of progressively worsening shortness of breath. Underwent thoracentesis x 2. Cardiology consulted Was treated with IV diuresis.  Now on none. Management per cardiology.  Exudative bilateral pleural effusion. Pleuritic chest pain. Underwent thoracentesis 11/28.  1.1 L fluid removed. Repeat procedure 12/1.  900 mL fluid removed. Cytology negative.  Cultures so far negative from the past fluid. Concern for possible inflammatory etiology. Etiology of the chest pain possibly pleurisy. Pleural fluid is exudative in nature Initiating steroids to support inflammation. Follow-up on cultures.  Positive ANA. Further workup sent out.  Urinary retention. Patient also had complained about difficulty urinating. Bladder scan in the ED was showing 300 cc. Due to retention and abdominal pain a Foley catheter was inserted in ED. Foley catheter is removed on 12/2 Oxybutynin  as needed. Flomax  initiated as well.   Permanent A-fib Tachybradycardia syndrome status post PPM: Patient is predominantly V-paced. Currently on Eliquis . Currently rate controlled without medication.  Leukocytosis: Monitor for now.  CAD-nonobstructive: not on aspirin  due to DOAC, not on a statin due to intolerance  Hyperthyroidism Goiter: On methimazole .   Last TSH 0.07, free T4 0.9 Normal on 11/3. No changes  T2DM: Continue sliding scale insulin .  Constipation: Abdominal pain. CT abdomen shows no acute abnormality. Continue with bowel regimen Levsin  as needed.

## 2024-05-02 ENCOUNTER — Inpatient Hospital Stay (HOSPITAL_COMMUNITY)

## 2024-05-02 DIAGNOSIS — I4821 Permanent atrial fibrillation: Secondary | ICD-10-CM | POA: Diagnosis not present

## 2024-05-02 DIAGNOSIS — I5033 Acute on chronic diastolic (congestive) heart failure: Secondary | ICD-10-CM

## 2024-05-02 DIAGNOSIS — I2721 Secondary pulmonary arterial hypertension: Secondary | ICD-10-CM | POA: Diagnosis not present

## 2024-05-02 LAB — HEMOGLOBIN A1C
Hgb A1c MFr Bld: 6.5 % — ABNORMAL HIGH (ref 4.8–5.6)
Mean Plasma Glucose: 140 mg/dL

## 2024-05-02 LAB — COMPREHENSIVE METABOLIC PANEL WITH GFR
ALT: 10 U/L (ref 0–44)
AST: 14 U/L — ABNORMAL LOW (ref 15–41)
Albumin: 2.7 g/dL — ABNORMAL LOW (ref 3.5–5.0)
Alkaline Phosphatase: 107 U/L (ref 38–126)
Anion gap: 9 (ref 5–15)
BUN: 20 mg/dL (ref 8–23)
CO2: 28 mmol/L (ref 22–32)
Calcium: 8.6 mg/dL — ABNORMAL LOW (ref 8.9–10.3)
Chloride: 91 mmol/L — ABNORMAL LOW (ref 98–111)
Creatinine, Ser: 0.97 mg/dL (ref 0.44–1.00)
GFR, Estimated: 59 mL/min — ABNORMAL LOW (ref >60)
Glucose, Bld: 233 mg/dL — ABNORMAL HIGH (ref 70–99)
Potassium: 4.7 mmol/L (ref 3.5–5.1)
Sodium: 128 mmol/L — ABNORMAL LOW (ref 135–145)
Total Bilirubin: 1.3 mg/dL — ABNORMAL HIGH (ref 0.0–1.2)
Total Protein: 5.9 g/dL — ABNORMAL LOW (ref 6.5–8.1)

## 2024-05-02 LAB — LACTIC ACID, PLASMA: Lactic Acid, Venous: 1.4 mmol/L (ref 0.5–1.9)

## 2024-05-02 LAB — C3 COMPLEMENT: C3 Complement: 133 mg/dL (ref 82–167)

## 2024-05-02 LAB — BASIC METABOLIC PANEL WITH GFR
Anion gap: 12 (ref 5–15)
BUN: 20 mg/dL (ref 8–23)
CO2: 28 mmol/L (ref 22–32)
Calcium: 8.5 mg/dL — ABNORMAL LOW (ref 8.9–10.3)
Chloride: 88 mmol/L — ABNORMAL LOW (ref 98–111)
Creatinine, Ser: 1.12 mg/dL — ABNORMAL HIGH (ref 0.44–1.00)
GFR, Estimated: 50 mL/min — ABNORMAL LOW (ref >60)
Glucose, Bld: 127 mg/dL — ABNORMAL HIGH (ref 70–99)
Potassium: 3.6 mmol/L (ref 3.5–5.1)
Sodium: 128 mmol/L — ABNORMAL LOW (ref 135–145)

## 2024-05-02 LAB — CBC
HCT: 39 % (ref 36.0–46.0)
Hemoglobin: 12.2 g/dL (ref 12.0–15.0)
MCH: 25.6 pg — ABNORMAL LOW (ref 26.0–34.0)
MCHC: 31.3 g/dL (ref 30.0–36.0)
MCV: 81.8 fL (ref 80.0–100.0)
Platelets: 346 K/uL (ref 150–400)
RBC: 4.77 MIL/uL (ref 3.87–5.11)
RDW: 15.3 % (ref 11.5–15.5)
WBC: 10.8 K/uL — ABNORMAL HIGH (ref 4.0–10.5)
nRBC: 0 % (ref 0.0–0.2)

## 2024-05-02 LAB — ANTINUCLEAR ANTIBODIES, IFA: ANA Ab, IFA: POSITIVE — AB

## 2024-05-02 LAB — FANA STAINING PATTERNS: Homogeneous Pattern: 1 — ABNORMAL HIGH

## 2024-05-02 LAB — LACTATE DEHYDROGENASE: LDH: 123 U/L (ref 105–235)

## 2024-05-02 LAB — APTT: aPTT: 87 s — ABNORMAL HIGH (ref 24–36)

## 2024-05-02 LAB — GLUCOSE, CAPILLARY
Glucose-Capillary: 185 mg/dL — ABNORMAL HIGH (ref 70–99)
Glucose-Capillary: 281 mg/dL — ABNORMAL HIGH (ref 70–99)

## 2024-05-02 LAB — OSMOLALITY: Osmolality: 281 mosm/kg (ref 275–295)

## 2024-05-02 LAB — HEPARIN LEVEL (UNFRACTIONATED): Heparin Unfractionated: >1.1 [IU]/mL — ABNORMAL HIGH (ref 0.30–0.70)

## 2024-05-02 LAB — LIPASE, BLOOD: Lipase: 23 U/L (ref 11–51)

## 2024-05-02 MED ORDER — IOHEXOL 350 MG/ML SOLN
75.0000 mL | Freq: Once | INTRAVENOUS | Status: AC | PRN
  Administered 2024-05-02: 75 mL via INTRAVENOUS

## 2024-05-02 MED ORDER — OXYCODONE HCL 5 MG PO TABS
2.5000 mg | ORAL_TABLET | ORAL | Status: DC | PRN
  Administered 2024-05-04: 2.5 mg via ORAL
  Filled 2024-05-02: qty 1

## 2024-05-02 MED ORDER — OXYCODONE HCL 5 MG PO TABS
5.0000 mg | ORAL_TABLET | ORAL | Status: DC | PRN
  Administered 2024-05-02 – 2024-05-03 (×2): 5 mg via ORAL
  Filled 2024-05-02 (×2): qty 1

## 2024-05-02 MED ORDER — ACETAMINOPHEN 325 MG PO TABS: 650.0000 mg | ORAL_TABLET | Freq: Four times a day (QID) | ORAL | Status: DC | PRN

## 2024-05-02 MED ORDER — MORPHINE SULFATE (PF) 2 MG/ML IV SOLN
1.0000 mg | INTRAVENOUS | Status: DC | PRN
  Administered 2024-05-02: 1 mg via INTRAVENOUS
  Filled 2024-05-02: qty 1

## 2024-05-02 MED ORDER — ACETAMINOPHEN 500 MG PO TABS
1000.0000 mg | ORAL_TABLET | Freq: Four times a day (QID) | ORAL | Status: DC
  Administered 2024-05-02 – 2024-05-06 (×14): 1000 mg via ORAL
  Filled 2024-05-02 (×15): qty 2

## 2024-05-02 MED ORDER — HYDROCODONE-ACETAMINOPHEN 5-325 MG PO TABS: 1.0000 | ORAL_TABLET | ORAL | Status: DC | PRN

## 2024-05-02 MED ORDER — MORPHINE SULFATE (PF) 2 MG/ML IV SOLN: 1.0000 mg | INTRAVENOUS | Status: DC | PRN

## 2024-05-02 MED ORDER — POTASSIUM CHLORIDE CRYS ER 20 MEQ PO TBCR
40.0000 meq | EXTENDED_RELEASE_TABLET | Freq: Once | ORAL | Status: AC
  Administered 2024-05-02: 40 meq via ORAL
  Filled 2024-05-02: qty 2

## 2024-05-02 NOTE — Progress Notes (Signed)
 [facility] Progress Note Patient: Hailey [name] FMW:[id number] DOB: 06/06/1945  DOA: 04/30/2024 DOS: the patient was seen and examined on 05/02/2024  Brief Hospital Course: Hailey [name] is a 78 y.o. female with PMH of t of CAD (70% RCA stenosis treated medically), Afib, tachy-brady syndrome s/p PPM, HFpEF (EF 60-65% at [facility] in 11/2023), HTN, DM2, hyperthyroidism, OSA, and most recent admission to [facility] for HFpEF exacerbation treated with IV lasix  40mg  BID who presents with shortness of breath progressively worsening past week or so and also, urinary retention In the ED: tachypneic on 2-3L [place]. Labs notable for BNP 151. CXR w/ R pleural effusion. IR consulted for R thoracentesis (s/p 1.1L hazy yellow fluid removed) EDP consulted Cards and requested medicine admission.  Assessment and plan: Bilateral pleural effusion right more than left secondary to acute on chronic diastolic CHF. Mixed pulmonary hypertension-mixed PAH/pulmonary venous hypertension Acute hypoxic respiratory failure: Presents with complaints of progressively worsening shortness of breath. Underwent thoracentesis. Unfortunately labs were not sent appropriately. Repeat x-ray shows reoccurrence of pleural effusion. Will perform another thoracentesis. Cardiology consulted Currently receiving IV diuresis. Right heart cath planned for tomorrow. Currently on heparin .  I will stop it for the cath. On sildenafil  20 3 times daily for PHTN   Permanent A-fib Tachybradycardia syndrome status post PPM: Patient is predominantly V-paced.  Eliquis  held and currently on heparin . Home metoprolol  12.5,aldactone   on hold.  Monitor on telemetry  Leukocytosis: Recheck CBC no fever.  Monitor  CAD-nonobstructive: not on aspirin  due to DOAC, not on a statin due to intolerance  Hyperthyroidism Goiter: On methimazole .  Last TSH 0.07, free T40.9 Normal on 11/3. No changes  T2DM: Continue sliding scale  insulin .  Constipation: Abdominal pain. CT abdomen was ordered.  No evidence of acute abnormality. Aggressive bowel regimen initiated.   Subjective: No nausea no vomiting.  Reports abdominal pain 7 out of 10.  Also reports chest pain 8 out of 10.  No fever no chills.  Physical Exam: General: in Mild distress, No Rash Cardiovascular: S1 and S2 Present, No Murmur Respiratory: Good respiratory effort, Bilateral Air entry present. No Crackles, No wheezes Abdomen: Bowel Sound present, severe diffuse tenderness Extremities: Bilateral edema Neuro: Alert and oriented x3, no new focal deficit   Data Reviewed: I have Reviewed nursing notes, Vitals, and Lab results. Since last encounter, pertinent lab results CBC and BMP   . I have ordered test including CBC and BMP  . I have discussed pt's care plan and test results with cardiology  . I have ordered imaging CT abdomen and pelvis  .   Disposition: Status is: Inpatient Remains inpatient appropriate because: Monitor for improvement in volume status Family Communication: No one to bedside Level of care: Telemetry   Vitals:   05/02/24 0310 05/02/24 0710 05/02/24 1205 05/02/24 1707  BP: 132/63 138/75 138/71 (!) 147/61  Pulse: 99 (!) 109 82 91  Resp: 16 17 16 16   Temp: 98.2 F (36.8 C) 98.2 F (36.8 C) 98.8 F (37.1 C) 97.7 F (36.5 C)  TempSrc: Oral Oral Oral Axillary  SpO2: 100% 97% 99% 98%  Weight:      Height:         Author: Yetta Blanch, MD 05/02/2024 5:54 PM  Please look on [email or web address] to find out who is on call.

## 2024-05-02 NOTE — Plan of Care (Signed)

## 2024-05-02 NOTE — Progress Notes (Signed)
  Progress Note  Patient Name: MARTESHA NIEDERMEIER Date of Encounter: 05/02/2024 [facility] Cardiologist: Jerel Balding, MD   Interval Summary   Not feeling well.  Very tired.  Has pleuritic chest pain anterior and posterior right chest.  This began after the thoracentesis and has been present since. CT of the abdomen pelvis formed.  No liver abnormalities, splenomegaly or ascites seen.  Mention of partly obscured nodularity with right middle lobe atelectasis surrounded by a moderate to large residual right pleural effusion  Vital Signs Vitals:   05/01/24 2110 05/01/24 2310 05/02/24 0310 05/02/24 0710  BP: 137/76 (!) 147/72 132/63 138/75  Pulse: 87 87 99 (!) 109  Resp: 16 20 16 17   Temp: 97.7 F (36.5 C) 97.9 F (36.6 C) 98.2 F (36.8 C) 98.2 F (36.8 C)  TempSrc: Oral Oral Oral Oral  SpO2: 100% 97% 100% 97%  Weight:      Height:        Intake/Output Summary (Last 24 hours) at 05/02/2024 1241 Last data filed at 05/02/2024 0839 Gross per 24 hour  Intake 416 ml  Output 1150 ml  Net -734 ml      04/30/2024   10:48 AM 04/23/2024    1:10 PM 04/14/2024    3:35 PM  Last 3 Weights  Weight (lbs) 103 lb 103 lb 12.8 oz 105 lb  Weight (kg) 46.72 kg 47.083 kg 47.628 kg      Telemetry/ECG  Atrial fibrillation controlled ventricular response, rare ventricular pacing.- Personally Reviewed  Physical Exam  GEN: No acute distress.   Neck: No JVD Cardiac: Irregular, 1/6 holosystolic murmur LLSB, no diastolic murmurs, rubs, or gallops.  Respiratory: Clear to auscultation bilaterally. GI: Soft, nontender, non-distended  MS: No edema  Assessment & Plan  78 year old woman with severe pulmonary artery hypertension, chronic diastolic heart failure, permanent atrial fibrillation, dual-chamber permanent pacemaker, DM 2, hyperthyroidism (not yet compensated on methimazole , retrosternal goiter) presents with severe shortness of breath and found to have a very large right pleural  effusion, without other clear signs of decompensated left heart failure (clear left lung, BNP and proBNP lower than previous levels, no peripheral edema).  Developing hyponatremia and slight worsening of renal parameters with diuresis.  No improvement in pleural effusion, which appears to be reaccumulating.  Abdominal CT does not show evidence of cirrhosis, splenomegaly or ascites.  She has moderate hypoalbuminemia during this admission, but throughout the year her albumin  levels and bilirubin levels have generally been normal.  No evidence of proteinuria.  Unfortunately, albumin  and LDH were not checked on the thoracentesis fluid.  It looks like she will need another thoracentesis and she still has a moderate to large right pleural effusion.  Will need to make sure that labs are sent appropriately to exclude exudative effusion.  Autoimmune workup also submitted.  Note markedly elevated CRP but relatively normal sed rate.  Plan for right heart catheterization tomorrow to better understand and quantify her pulmonary hypertension.  Eliquis  on hold, continue IV heparin  in anticipation of cardiac catheterization.  There would also be an optimal opportunity for repeat thoracentesis.       For questions or updates, please contact [facility] Please consult [email or web address] for contact info under         Signed, Jerel Balding, MD

## 2024-05-02 NOTE — Progress Notes (Signed)
 ANTICOAGULATION CONSULT NOTE Pharmacy Consult for Heparin  Indication: atrial fibrillation Brief A/P: aPTT within goal range Continue Heparin  at current rate   Allergies  Allergen Reactions   Codeine Nausea And Vomiting and Nausea Only   Ketorolac  Nausea And Vomiting, Nausea Only and Other (See Comments)    pt told to never take this med   Meperidine  Nausea Only and Nausea And Vomiting   Multaq  [Dronedarone ] Other (See Comments)    Increased fluid retention   Xarelto [Rivaroxaban] Diarrhea   Flexeril  [Cyclobenzaprine ] Nausea And Vomiting   Latex Rash    Pt stated on 09/20/2022    Patient Measurements: Height: 4' 11 (149.9 cm) Weight: 46.7 kg (103 lb) IBW/kg (Calculated) : 43.2 Heparin  Dosing Weight: 46.7 kg  Vital Signs: Temp: 98.2 F (36.8 C) (11/30 0310) Temp Source: Oral (11/30 0310) BP: 132/63 (11/30 0310) Pulse Rate: 99 (11/30 0310)  Labs: Recent Labs    04/30/24 1154 04/30/24 1426 04/30/24 1830 05/01/24 0439 05/01/24 1358 05/01/24 2241 05/02/24 0318  HGB 12.8  --  13.4  --   --   --  12.2  HCT 41.9  --  42.9  --   --   --  39.0  PLT 432*  --  415*  --   --   --  346  APTT  --   --  36   < > 59* 79* 87*  HEPARINUNFRC  --   --  >1.10*   < > >1.10* >1.10* >1.10*  CREATININE 1.00 0.98  --   --   --   --  1.12*  TROPONINIHS 7 7  --   --   --   --   --    < > = values in this interval not displayed.    Estimated Creatinine Clearance: 27.8 mL/min (A) (by C-G formula based on SCr of 1.12 mg/dL (H)).   Assessment: 78 y.o. female with h/o Afib, Eliquis  on hold, for heparin . aPTT remains at goal, however HL still elevated. Will continue to monitor aPTT and heparin  level until heparin  level normalizes.  Goal of Therapy:  Heparin  level 0.3-0.7 units/ml aPTT 66-102 seconds Monitor platelets by anticoagulation protocol: Yes   Plan:  No change to heparin  Continue to monitor daily heparin  and aPTT levels  Continue to monitor signs and symptoms of bleeding  and/or hemorrhage with daily CBC  R. Samual Satterfield, PharmD PGY-1 Acute Care Pharmacy Resident [facility] Please refer to [facility] for [facility] Pharmacy numbers 05/02/2024 7:10 AM

## 2024-05-02 NOTE — H&P (View-Only) (Signed)
  Progress Note  Patient Name: MARTESHA NIEDERMEIER Date of Encounter: 05/02/2024 [facility] Cardiologist: Jerel Balding, MD   Interval Summary   Not feeling well.  Very tired.  Has pleuritic chest pain anterior and posterior right chest.  This began after the thoracentesis and has been present since. CT of the abdomen pelvis formed.  No liver abnormalities, splenomegaly or ascites seen.  Mention of partly obscured nodularity with right middle lobe atelectasis surrounded by a moderate to large residual right pleural effusion  Vital Signs Vitals:   05/01/24 2110 05/01/24 2310 05/02/24 0310 05/02/24 0710  BP: 137/76 (!) 147/72 132/63 138/75  Pulse: 87 87 99 (!) 109  Resp: 16 20 16 17   Temp: 97.7 F (36.5 C) 97.9 F (36.6 C) 98.2 F (36.8 C) 98.2 F (36.8 C)  TempSrc: Oral Oral Oral Oral  SpO2: 100% 97% 100% 97%  Weight:      Height:        Intake/Output Summary (Last 24 hours) at 05/02/2024 1241 Last data filed at 05/02/2024 0839 Gross per 24 hour  Intake 416 ml  Output 1150 ml  Net -734 ml      04/30/2024   10:48 AM 04/23/2024    1:10 PM 04/14/2024    3:35 PM  Last 3 Weights  Weight (lbs) 103 lb 103 lb 12.8 oz 105 lb  Weight (kg) 46.72 kg 47.083 kg 47.628 kg      Telemetry/ECG  Atrial fibrillation controlled ventricular response, rare ventricular pacing.- Personally Reviewed  Physical Exam  GEN: No acute distress.   Neck: No JVD Cardiac: Irregular, 1/6 holosystolic murmur LLSB, no diastolic murmurs, rubs, or gallops.  Respiratory: Clear to auscultation bilaterally. GI: Soft, nontender, non-distended  MS: No edema  Assessment & Plan  78 year old woman with severe pulmonary artery hypertension, chronic diastolic heart failure, permanent atrial fibrillation, dual-chamber permanent pacemaker, DM 2, hyperthyroidism (not yet compensated on methimazole , retrosternal goiter) presents with severe shortness of breath and found to have a very large right pleural  effusion, without other clear signs of decompensated left heart failure (clear left lung, BNP and proBNP lower than previous levels, no peripheral edema).  Developing hyponatremia and slight worsening of renal parameters with diuresis.  No improvement in pleural effusion, which appears to be reaccumulating.  Abdominal CT does not show evidence of cirrhosis, splenomegaly or ascites.  She has moderate hypoalbuminemia during this admission, but throughout the year her albumin  levels and bilirubin levels have generally been normal.  No evidence of proteinuria.  Unfortunately, albumin  and LDH were not checked on the thoracentesis fluid.  It looks like she will need another thoracentesis and she still has a moderate to large right pleural effusion.  Will need to make sure that labs are sent appropriately to exclude exudative effusion.  Autoimmune workup also submitted.  Note markedly elevated CRP but relatively normal sed rate.  Plan for right heart catheterization tomorrow to better understand and quantify her pulmonary hypertension.  Eliquis  on hold, continue IV heparin  in anticipation of cardiac catheterization.  There would also be an optimal opportunity for repeat thoracentesis.       For questions or updates, please contact [facility] Please consult [email or web address] for contact info under         Signed, Jerel Balding, MD

## 2024-05-03 ENCOUNTER — Encounter (HOSPITAL_COMMUNITY): Admission: EM | Disposition: A | Payer: Self-pay | Source: Home / Self Care | Attending: Internal Medicine

## 2024-05-03 ENCOUNTER — Encounter: Payer: Self-pay | Admitting: Cardiovascular Disease

## 2024-05-03 ENCOUNTER — Other Ambulatory Visit: Payer: Self-pay

## 2024-05-03 ENCOUNTER — Inpatient Hospital Stay (HOSPITAL_COMMUNITY)

## 2024-05-03 DIAGNOSIS — I4891 Unspecified atrial fibrillation: Secondary | ICD-10-CM

## 2024-05-03 DIAGNOSIS — I5033 Acute on chronic diastolic (congestive) heart failure: Secondary | ICD-10-CM | POA: Diagnosis not present

## 2024-05-03 DIAGNOSIS — I5032 Chronic diastolic (congestive) heart failure: Secondary | ICD-10-CM | POA: Diagnosis not present

## 2024-05-03 DIAGNOSIS — I509 Heart failure, unspecified: Secondary | ICD-10-CM

## 2024-05-03 DIAGNOSIS — J90 Pleural effusion, not elsewhere classified: Secondary | ICD-10-CM | POA: Diagnosis not present

## 2024-05-03 DIAGNOSIS — I272 Pulmonary hypertension, unspecified: Secondary | ICD-10-CM | POA: Diagnosis not present

## 2024-05-03 HISTORY — PX: RIGHT HEART CATH: CATH118263

## 2024-05-03 HISTORY — PX: IR THORACENTESIS ASP PLEURAL SPACE W/IMG GUIDE: IMG5380

## 2024-05-03 LAB — PROTEIN, PLEURAL OR PERITONEAL FLUID: Total protein, fluid: 4 g/dL

## 2024-05-03 LAB — POCT I-STAT EG7
Acid-Base Excess: 6 mmol/L — ABNORMAL HIGH (ref 0.0–2.0)
Acid-Base Excess: 7 mmol/L — ABNORMAL HIGH (ref 0.0–2.0)
Acid-Base Excess: 7 mmol/L — ABNORMAL HIGH (ref 0.0–2.0)
Bicarbonate: 32.6 mmol/L — ABNORMAL HIGH (ref 20.0–28.0)
Bicarbonate: 33.5 mmol/L — ABNORMAL HIGH (ref 20.0–28.0)
Bicarbonate: 33.7 mmol/L — ABNORMAL HIGH (ref 20.0–28.0)
Calcium, Ion: 1.14 mmol/L — ABNORMAL LOW (ref 1.15–1.40)
Calcium, Ion: 1.17 mmol/L (ref 1.15–1.40)
Calcium, Ion: 1.18 mmol/L (ref 1.15–1.40)
HCT: 39 % (ref 36.0–46.0)
HCT: 40 % (ref 36.0–46.0)
HCT: 40 % (ref 36.0–46.0)
Hemoglobin: 13.3 g/dL (ref 12.0–15.0)
Hemoglobin: 13.6 g/dL (ref 12.0–15.0)
Hemoglobin: 13.6 g/dL (ref 12.0–15.0)
O2 Saturation: 75 %
O2 Saturation: 75 %
O2 Saturation: 76 %
Potassium: 4.7 mmol/L (ref 3.5–5.1)
Potassium: 4.7 mmol/L (ref 3.5–5.1)
Potassium: 4.7 mmol/L (ref 3.5–5.1)
Sodium: 126 mmol/L — ABNORMAL LOW (ref 135–145)
Sodium: 127 mmol/L — ABNORMAL LOW (ref 135–145)
Sodium: 127 mmol/L — ABNORMAL LOW (ref 135–145)
TCO2: 34 mmol/L — ABNORMAL HIGH (ref 22–32)
TCO2: 35 mmol/L — ABNORMAL HIGH (ref 22–32)
TCO2: 35 mmol/L — ABNORMAL HIGH (ref 22–32)
pCO2, Ven: 52.5 mmHg (ref 44–60)
pCO2, Ven: 53.5 mmHg (ref 44–60)
pCO2, Ven: 56.3 mmHg (ref 44–60)
pH, Ven: 7.385 (ref 7.25–7.43)
pH, Ven: 7.4 (ref 7.25–7.43)
pH, Ven: 7.405 (ref 7.25–7.43)
pO2, Ven: 41 mmHg (ref 32–45)
pO2, Ven: 41 mmHg (ref 32–45)
pO2, Ven: 42 mmHg (ref 32–45)

## 2024-05-03 LAB — CBC
HCT: 38 % (ref 36.0–46.0)
Hemoglobin: 11.8 g/dL — ABNORMAL LOW (ref 12.0–15.0)
MCH: 25.8 pg — ABNORMAL LOW (ref 26.0–34.0)
MCHC: 31.1 g/dL (ref 30.0–36.0)
MCV: 83.2 fL (ref 80.0–100.0)
Platelets: 335 K/uL (ref 150–400)
RBC: 4.57 MIL/uL (ref 3.87–5.11)
RDW: 15.2 % (ref 11.5–15.5)
WBC: 11.2 K/uL — ABNORMAL HIGH (ref 4.0–10.5)
nRBC: 0 % (ref 0.0–0.2)

## 2024-05-03 LAB — LACTATE DEHYDROGENASE, PLEURAL OR PERITONEAL FLUID: LD, Fluid: 140 U/L — ABNORMAL HIGH (ref 3–23)

## 2024-05-03 LAB — BODY FLUID CELL COUNT WITH DIFFERENTIAL
Eos, Fluid: 1 %
Lymphs, Fluid: 4 %
Monocyte-Macrophage-Serous Fluid: 15 % — ABNORMAL LOW (ref 50–90)
Neutrophil Count, Fluid: 80 % — ABNORMAL HIGH (ref 0–25)
Total Nucleated Cell Count, Fluid: 5100 uL — ABNORMAL HIGH (ref 0–1000)

## 2024-05-03 LAB — GLUCOSE, CAPILLARY
Glucose-Capillary: 135 mg/dL — ABNORMAL HIGH (ref 70–99)
Glucose-Capillary: 392 mg/dL — ABNORMAL HIGH (ref 70–99)

## 2024-05-03 LAB — BASIC METABOLIC PANEL WITH GFR
Anion gap: 9 (ref 5–15)
BUN: 19 mg/dL (ref 8–23)
CO2: 29 mmol/L (ref 22–32)
Calcium: 8.6 mg/dL — ABNORMAL LOW (ref 8.9–10.3)
Chloride: 91 mmol/L — ABNORMAL LOW (ref 98–111)
Creatinine, Ser: 0.81 mg/dL (ref 0.44–1.00)
GFR, Estimated: >60 mL/min
Glucose, Bld: 113 mg/dL — ABNORMAL HIGH (ref 70–99)
Potassium: 4.5 mmol/L (ref 3.5–5.1)
Sodium: 129 mmol/L — ABNORMAL LOW (ref 135–145)

## 2024-05-03 LAB — APTT: aPTT: 57 s — ABNORMAL HIGH (ref 24–36)

## 2024-05-03 LAB — LACTATE DEHYDROGENASE: LDH: 123 U/L (ref 105–235)

## 2024-05-03 LAB — HEPARIN LEVEL (UNFRACTIONATED): Heparin Unfractionated: >1.1 [IU]/mL — ABNORMAL HIGH (ref 0.30–0.70)

## 2024-05-03 SURGERY — RIGHT HEART CATH
Anesthesia: LOCAL

## 2024-05-03 MED ORDER — LIDOCAINE HCL (PF) 1 % IJ SOLN
INTRAMUSCULAR | Status: DC | PRN
  Administered 2024-05-03: 5 mL via INTRADERMAL

## 2024-05-03 MED ORDER — METHYLPREDNISOLONE 4 MG PO TBPK: 8.0000 mg | ORAL_TABLET | Freq: Every morning | ORAL | Status: DC

## 2024-05-03 MED ORDER — ACETAMINOPHEN 325 MG PO TABS
650.0000 mg | ORAL_TABLET | ORAL | Status: DC | PRN
  Administered 2024-05-04: 650 mg via ORAL
  Filled 2024-05-03: qty 2

## 2024-05-03 MED ORDER — SODIUM CHLORIDE 0.9% FLUSH
3.0000 mL | Freq: Two times a day (BID) | INTRAVENOUS | Status: DC
  Administered 2024-05-03 – 2024-05-04 (×3): 3 mL via INTRAVENOUS

## 2024-05-03 MED ORDER — HEPARIN (PORCINE) IN NACL 1000-0.9 UT/500ML-% IV SOLN
INTRAVENOUS | Status: DC | PRN
  Administered 2024-05-03: 500 mL

## 2024-05-03 MED ORDER — APIXABAN 5 MG PO TABS
5.0000 mg | ORAL_TABLET | Freq: Two times a day (BID) | ORAL | Status: DC
  Administered 2024-05-03 – 2024-05-06 (×6): 5 mg via ORAL
  Filled 2024-05-03 (×6): qty 1

## 2024-05-03 MED ORDER — METHYLPREDNISOLONE 4 MG PO TBPK: 4.0000 mg | ORAL_TABLET | ORAL | Status: DC

## 2024-05-03 MED ORDER — SODIUM CHLORIDE 0.9 % IV SOLN: 250.0000 mL | INTRAVENOUS | Status: DC | PRN

## 2024-05-03 MED ORDER — FREE WATER
250.0000 mL | Freq: Once | Status: AC
  Administered 2024-05-03: 250 mL via ORAL

## 2024-05-03 MED ORDER — SILDENAFIL CITRATE 20 MG PO TABS
40.0000 mg | ORAL_TABLET | Freq: Three times a day (TID) | ORAL | Status: DC
  Administered 2024-05-03 – 2024-05-06 (×9): 40 mg via ORAL
  Filled 2024-05-03 (×11): qty 2

## 2024-05-03 MED ORDER — LIDOCAINE HCL (PF) 1 % IJ SOLN
INTRAMUSCULAR | Status: AC
  Filled 2024-05-03: qty 30

## 2024-05-03 MED ORDER — METHYLPREDNISOLONE 4 MG PO TBPK: 8.0000 mg | ORAL_TABLET | Freq: Every evening | ORAL | Status: DC

## 2024-05-03 MED ORDER — PREDNISONE 20 MG PO TABS
20.0000 mg | ORAL_TABLET | Freq: Every day | ORAL | Status: DC
  Administered 2024-05-03 – 2024-05-06 (×4): 20 mg via ORAL
  Filled 2024-05-03 (×5): qty 1

## 2024-05-03 MED ORDER — HYDRALAZINE HCL 20 MG/ML IJ SOLN: 10.0000 mg | INTRAMUSCULAR | Status: AC | PRN

## 2024-05-03 MED ORDER — LIDOCAINE-EPINEPHRINE 1 %-1:100000 IJ SOLN
INTRAMUSCULAR | Status: AC
  Filled 2024-05-03: qty 1

## 2024-05-03 MED ORDER — METHYLPREDNISOLONE 4 MG PO TBPK: 4.0000 mg | ORAL_TABLET | Freq: Three times a day (TID) | ORAL | Status: DC

## 2024-05-03 MED ORDER — METHYLPREDNISOLONE 4 MG PO TBPK: 4.0000 mg | ORAL_TABLET | Freq: Four times a day (QID) | ORAL | Status: DC

## 2024-05-03 MED ORDER — SODIUM CHLORIDE 0.9% FLUSH: 3.0000 mL | INTRAVENOUS | Status: DC | PRN

## 2024-05-03 MED ORDER — ONDANSETRON HCL 4 MG/2ML IJ SOLN
4.0000 mg | Freq: Four times a day (QID) | INTRAMUSCULAR | Status: DC | PRN
  Administered 2024-05-03 – 2024-05-06 (×4): 4 mg via INTRAVENOUS
  Filled 2024-05-03 (×4): qty 2

## 2024-05-03 MED ORDER — APIXABAN 5 MG PO TABS: 5.0000 mg | ORAL_TABLET | Freq: Two times a day (BID) | ORAL | Status: DC

## 2024-05-03 SURGICAL SUPPLY — 5 items
CATH BALLN WEDGE 5F 110CM (CATHETERS) IMPLANT
PACK CARDIAC CATHETERIZATION (CUSTOM PROCEDURE TRAY) ×2 IMPLANT
SHEATH GLIDE SLENDER 4/5FR (SHEATH) IMPLANT
TRANSDUCER W/STOPCOCK (MISCELLANEOUS) IMPLANT
TUBING ART PRESS 72 MALE/FEM (TUBING) IMPLANT

## 2024-05-03 NOTE — Progress Notes (Signed)
  Progress Note  Patient Name: Hailey [name] Date of Encounter: 05/03/2024 [facility] Cardiologist: Jerel Balding, MD   Interval Summary   Patient back from thoracentesis, 900 cc removed from right today Complaining of right sided pain, similar to when she had a previous thoracentesis done 11/28 PRN morphine  is ordered per primary team, patient has not received dose yet Plan to proceed with right heart cath later today  Vital Signs Vitals:   05/02/24 2004 05/03/24 0031 05/03/24 0533 05/03/24 0741  BP: (!) 115/56 (!) 143/64 (!) 141/61 (!) 144/66  Pulse: 83 71 85 90  Resp:  16 15 20   Temp: 98.4 F (36.9 C) 98 F (36.7 C) 98.3 F (36.8 C) 97.7 F (36.5 C)  TempSrc: Oral Oral Oral Oral  SpO2: 99% 99% 98% 98%  Weight:      Height:        Intake/Output Summary (Last 24 hours) at 05/03/2024 1016 Last data filed at 05/03/2024 0615 Gross per 24 hour  Intake 717 ml  Output 600 ml  Net 117 ml      04/30/2024   10:48 AM 04/23/2024    1:10 PM 04/14/2024    3:35 PM  Last 3 Weights  Weight (lbs) 103 lb 103 lb 12.8 oz 105 lb  Weight (kg) 46.72 kg 47.083 kg 47.628 kg     Telemetry/ECG  Atrial flutter with HR 90s - Personally Reviewed  Physical Exam  GEN: No acute distress, on 3 L via [place].   Neck: No JVD Cardiac: iRRR, no murmurs, rubs, or gallops.  Respiratory: No increased respiratory effort. GI: Soft, nontender, non-distended  MS: No edema  Assessment & Plan   Chronic HFpEF Pulmonary Hypertension Bilateral Pleural Effusions, R  > L Home meds: spironolactone  25 mg daily, Lasix  80 mg BID with 20 mEq K TID, Lopressor  12.5 mg BID Recent echo: LVEF 60-65%, severely enlarged RV with normal RV function and severely elevated PASP, mild MR, and severe TR. RVSP had increased from 55.6 mmHg to 81.6 mmHg  Outpatient proBNP 2,352, BNP 151 Presented with worsening SOB Started on IV Lasix , no longer on  Underwent thoracentesis 11/28 with 1.1 L out from right Repeat  thoracentesis today 12/1 with 900 cc out from right  Renal function stable  Undergoing RHC today, further recs to follow pending results  Nonobstructive CAD LHC in 2019 showed non-obstructive disease  EKG shows no acute ischemic changes  Not on ASA due to Eliquis  use Not on statin due to intolerance  Permanent A-Fib s/p ablation 2014, 2015 Home meds: Eliquis  5 mg BID, Lopressor  12.5 mg BID Currently on IV heparin  in place of Eliquis , may be able to restart DOAC after RHC if there are no more planned procedures Not on any AV nodal blockers at the moment, rates controlled  Tachy-brady syndrome s/p PPM Last device check in 02/2024 showed RV was paced 93% of the time  Continue to follow with Dr. Balding as outpatient  Per primary Hyperthyroidism Diabetes Electrolyte disturbances Urinary retention Pleural effusions Abdominal pain   For questions or updates, please contact [facility] Please consult [email or web address] for contact info under   Signed, Waddell DELENA Donath, PA-C

## 2024-05-03 NOTE — Progress Notes (Signed)
 [facility] Progress Note Patient: Hailey [name] FMW:[id number] DOB: [birth year]  DOA: 04/30/2024 DOS: the patient was seen and examined on 05/03/2024  Brief Hospital Course: Hailey [name] is a 78 y.o. female with PMH of t of CAD (70% RCA stenosis treated medically), Afib, tachy-brady syndrome s/p PPM, HFpEF (EF 60-65% at [facility] in 11/2023), HTN, DM2, hyperthyroidism, OSA, and most recent admission to [facility] for HFpEF exacerbation treated with IV lasix  40mg  BID who presents with shortness of breath progressively worsening past week or so and also, urinary retention In the ED: tachypneic on 2-3L [place]. Labs notable for BNP 151. CXR w/ R pleural effusion. IR consulted for R thoracentesis (s/p 1.1L hazy yellow fluid removed) EDP consulted Cards and requested medicine admission.  Assessment and plan: Acute on chronic diastolic CHF. Mixed pulmonary hypertension-mixed PAH/pulmonary venous hypertension Acute hypoxic respiratory failure: Presents with complaints of progressively worsening shortness of breath. Underwent thoracentesis. Cardiology consulted Currently receiving IV diuresis. Management per cardiology.  Exudative bilateral pleural effusion. Pleuritic chest pain. Etiology of the chest pain possibly pleurisy. Pleural fluid is exudative in nature.  Workup currently pending. Initiating steroids to support inflammation. Follow-up on cultures.   Permanent A-fib Tachybradycardia syndrome status post PPM: Patient is predominantly V-paced.  Eliquis  held and currently on heparin . Home metoprolol  12.5,aldactone   on hold.  Monitor on telemetry  Leukocytosis: Monitor for now.  Anticipating reoccurrence of the leukocytosis given initiation of steroids.  CAD-nonobstructive: not on aspirin  due to DOAC, not on a statin due to intolerance  Hyperthyroidism Goiter: On methimazole .  Last TSH 0.07, free T40.9 Normal on 11/3. No changes  T2DM: Continue sliding scale  insulin .  Constipation: Abdominal pain. CT abdomen shows no acute abnormality. Continue with aggressive bowel regimen initiated.   Subjective: No nausea or vomiting no fever no chills.  No chest pain.  Physical Exam: Basal crackles. S1-S2 present Abdomen still tender to mild palpation. Trace edema.  Data Reviewed: I have Reviewed nursing notes, Vitals, and Lab results. Since last encounter, pertinent lab results CBC and BMP   . I have ordered test including CBC and BMP  . I have discussed pt's care plan and test results with radiology  .   Disposition: Status is: Inpatient Remains inpatient appropriate because: Monitor for improvement in volume status apixaban  (ELIQUIS ) tablet 5 mg   Family Communication: No one at bedside Level of care: Telemetry   Vitals:   05/03/24 1158 05/03/24 1203 05/03/24 1225 05/03/24 1624  BP: (!) 134/57 (!) 134/57 (!) 129/54 136/62  Pulse: 93 (!) 0 79 77  Resp: (!) 21  18 18   Temp:   98.3 F (36.8 C) 97.9 F (36.6 C)  TempSrc:   Oral Axillary  SpO2: 97%  99% 99%  Weight:      Height:         Author: Yetta Blanch, MD 05/03/2024 6:17 PM  Please look on [email or web address] to find out who is on call.

## 2024-05-03 NOTE — TOC Initial Note (Addendum)
 Transition of Care [facility]) - Initial/Assessment Note    Patient Details  Name: Hailey [name] MRN: [id number] Date of Birth: 07/08/1945  Transition of [facility]) CM/SW Contact:    Sudie Erminio Deems, RN Phone Number: 05/03/2024, 3:44 PM  Clinical Narrative: Patient presented for shortness of breath. 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 Patient has DME rolling walker if needed and CPAP via [employer]. Patient states she has a PCP and her daughter takes her to all appointments. Patient has PT/OT consult- awaiting recommendations.                 Expected Discharge Plan: Home w Home Health Services Barriers to Discharge: Continued Medical Work up   Patient Goals and CMS Choice Patient states their goals for this hospitalization and ongoing recovery are:: Plans to return home once stable.          Expected Discharge Plan and Services In-house Referral: NA Discharge Planning Services: CM Consult Post Acute Care Choice: Home Health Living arrangements for the past 2 months: Single Family Home                   DME Agency: NA                  Prior Living Arrangements/Services Living arrangements for the past 2 months: Single Family Home Lives with:: Spouse Patient language and need for interpreter reviewed:: Yes Do you feel safe going back to the place where you live?: Yes      Need for Family Participation in Patient Care: Yes (Comment) Care giver support system in place?: No (comment) Current home services: DME (rolling walker, CPAP from [employer].) Criminal Activity/Legal Involvement Pertinent to Current Situation/Hospitalization: No - Comment as needed  Activities of Daily Living   ADL Screening (condition at time of admission) Independently performs ADLs?: Yes (appropriate for developmental age) Is the patient deaf or have difficulty hearing?: No Does the patient have difficulty seeing, even when  wearing glasses/contacts?: No Does the patient have difficulty concentrating, remembering, or making decisions?: No  Permission Sought/Granted Permission sought to share information with : Case Manager, Family Supports                Emotional Assessment Appearance:: Appears stated age Attitude/Demeanor/Rapport: Engaged Affect (typically observed): Appropriate Orientation: : Oriented to Self, Oriented to Place, Oriented to  Time Alcohol / Substance Use: Not Applicable Psych Involvement: No (comment)  Admission diagnosis:  Urinary retention [R33.9] SOB (shortness of breath) [R06.02] Pleural effusion [J90] Acute on chronic heart failure (HCC) [I50.9] Acute congestive heart failure, unspecified heart failure type [employer]) [I50.9] Patient Active Problem List   Diagnosis Date Noted   Pleural effusion on right 05/01/2024   Acute on chronic heart failure (HCC) 04/30/2024   Immunization due 04/14/2024   Chronic systolic CHF (congestive heart failure) (HCC) 09/20/2022   Urinary incontinence 09/20/2022   GERD (gastroesophageal reflux disease) 09/20/2022   Obstructive sleep apnea 01/16/2022   Thyroid  goiter 01/16/2022   Right hip pain 09/25/2021   Snoring 08/22/2021   Longstanding persistent atrial fibrillation (HCC) 07/10/2021   Pacemaker battery depletion 08/09/2019   Portal hypertension (HCC) 06/14/2019   DM2 (diabetes mellitus, type 2) (HCC) 12/17/2018   Hypercoagulable state due to atrial fibrillation (HCC) 12/17/2018   PAH (pulmonary artery hypertension) (HCC) 06/01/2018   SSS (sick sinus syndrome) (HCC)    Hyperthyroidism 09/08/2017   Essential hypertension 09/08/2017   Abnormal cardiovascular  stress test    Pacemaker lead failure, initial encounter 05/26/2017   Multinodular goiter 02/20/2017   TMJ pain dysfunction syndrome 02/20/2017   Long term current use of anticoagulant 10/11/2015   Second degree AV block 10/10/2015   Atherosclerosis of native coronary artery of  native heart with angina pectoris 08/28/2013   Permanent atrial fibrillation (HCC) 08/10/2013   Nausea, vomiting, and diarrhea 05/27/2013   Chronic diastolic heart failure (HCC) 05/26/2013   Pacemaker [employer] Jude accent DR RF , July 2012 03/25/2013   Paroxysmal atrial fibrillation (HCC) 03/02/2012   Symptomatic sinus bradycardia 03/02/2012   Pulmonary nodule 02/18/2011   Mediastinal lymphadenopathy 02/18/2011   PCP:  [name], NP Pharmacy:   [facility], [place] - [address]  [place] [address] Phone: [phone] Fax: [phone]  [id number] - [place], [place] - [address] [place] [address] Phone: [phone] Fax: [phone]     Social Drivers of Health (SDOH) Social History: SDOH Screenings   Food Insecurity: No Food Insecurity (04/30/2024)  Housing: Low Risk  (04/30/2024)  Transportation Needs: No Transportation Needs (04/30/2024)  Utilities: Not At Risk (04/30/2024)  Depression (PHQ2-9): Low Risk  (06/18/2023)  Financial Resource Strain: Low Risk (11/03/2023)   Received from [facility]  Social Connections: Socially Integrated (04/30/2024)  Tobacco Use: Low Risk  (04/30/2024)   SDOH Interventions:     Readmission Risk Interventions     No data to display

## 2024-05-03 NOTE — Plan of Care (Signed)

## 2024-05-03 NOTE — Progress Notes (Signed)
   Informed Consent   Shared Decision Making/Informed Consent The risks, including but not limited to, [bleeding or vascular complications (1 in 500), pneumothorax (1 in 1600), arrhythmia (1 in 1000) and death (1 in 5000)], benefits (diagnostic support and/or management of heart failure, pulmonary hypertension) and alternatives of a right heart catheterization were discussed in detail with Hailey [name] and she is willing to proceed.     Waddell DELENA Donath, PA-C 05/03/2024 6:24 AM

## 2024-05-03 NOTE — Interval H&P Note (Signed)
 History and Physical Interval Note:  05/03/2024 11:08 AM  Hailey [name]  has presented today for surgery, with the diagnosis of HF.  The various methods of treatment have been discussed with the patient and family. After consideration of risks, benefits and other options for treatment, the patient has consented to  Procedure(s): RIGHT HEART CATH (N/A) as a surgical intervention.  The patient's history has been reviewed, patient examined, no change in status, stable for surgery.  I have reviewed the patient's chart and labs.  Questions were answered to the patient's satisfaction.     Mylo Choi

## 2024-05-03 NOTE — Plan of Care (Signed)
  Problem: Education: Goal: Knowledge of General Education information will improve Description: Including pain rating scale, medication(s)/side effects and non-pharmacologic comfort measures Outcome: Progressing   Problem: Clinical Measurements: Goal: Ability to maintain clinical measurements within normal limits will improve Outcome: Progressing   Problem: Clinical Measurements: Goal: Will remain free from infection Outcome: Progressing   Problem: Activity: Goal: Risk for activity intolerance will decrease Outcome: Progressing   Problem: Clinical Measurements: Goal: Respiratory complications will improve Outcome: Progressing   Problem: Safety: Goal: Ability to remain free from injury will improve Outcome: Progressing   Problem: Health Behavior/Discharge Planning: Goal: Ability to safely manage health-related needs after discharge will improve Outcome: Progressing   Problem: Cardiovascular: Goal: Ability to achieve and maintain adequate cardiovascular perfusion will improve Outcome: Progressing

## 2024-05-03 NOTE — Progress Notes (Addendum)
 ANTICOAGULATION CONSULT NOTE Pharmacy Consult for Heparin  Indication: atrial fibrillation Brief A/P: aPTT within goal range Continue Heparin  at current rate   Allergies  Allergen Reactions   Codeine Nausea And Vomiting and Nausea Only   Ketorolac  Nausea And Vomiting, Nausea Only and Other (See Comments)    pt told to never take this med   Meperidine  Nausea Only and Nausea And Vomiting   Multaq  [Dronedarone ] Other (See Comments)    Increased fluid retention   Xarelto [Rivaroxaban] Diarrhea   Flexeril  [Cyclobenzaprine ] Nausea And Vomiting   Latex Rash    Pt stated on 09/20/2022    Patient Measurements: Height: 4' 11 (149.9 cm) Weight: 46.7 kg (103 lb) IBW/kg (Calculated) : 43.2 Heparin  Dosing Weight: 46.7 kg  Vital Signs: Temp: 97.7 F (36.5 C) (12/01 0741) Temp Source: Oral (12/01 0741) BP: 144/66 (12/01 0741) Pulse Rate: 90 (12/01 0741)  Labs: Recent Labs    04/30/24 1154 04/30/24 1426 04/30/24 1830 05/01/24 0439 05/01/24 2241 05/02/24 0318 05/02/24 1054 05/03/24 0622  HGB 12.8  --  13.4  --   --  12.2  --  11.8*  HCT 41.9  --  42.9  --   --  39.0  --  38.0  PLT 432*  --  415*  --   --  346  --  335  APTT  --   --  36   < > 79* 87*  --  57*  HEPARINUNFRC  --   --  >1.10*   < > >1.10* >1.10*  --  >1.10*  CREATININE 1.00 0.98  --   --   --  1.12* 0.97 0.81  TROPONINIHS 7 7  --   --   --   --   --   --    < > = values in this interval not displayed.    Estimated Creatinine Clearance: 38.4 mL/min (by C-G formula based on SCr of 0.81 mg/dL).   Assessment: 78 y.o. female with h/o Afib on Eliquis  PTA, held for procedures.  Pharmacy consulted for heparin  dosing.  aPTT 57 sec this morning now below goal after being therapeutic x2d on 800 units/hr.  CBC stable (Hgb 11.8, pltc 335).  S/p R thora 11/28 - 1.1L removed.  S/p repeat thoracentesis - another 900cc removed.  RHC planned for today.  Goal of Therapy:  Heparin  level 0.3-0.7 units/ml aPTT 66-102  seconds Monitor platelets by anticoagulation protocol: Yes   Plan:  Hold heparin  after thoracentesis.   F/u to DOAC after RHC (on first thing this morning)  Maurilio Fila, PharmD Clinical Pharmacist 05/03/2024  8:16 AM

## 2024-05-03 NOTE — Procedures (Signed)
 PROCEDURE SUMMARY:  Successful image-guided right thoracentesis. Yielded 900 mL of hazy yellow fluid. Pt tolerated procedure well. No immediate complications. EBL = trace   Specimen was sent for labs. CXR ordered.  Please see imaging section of Epic for full dictation.  Taim Wurm H Aluna Whiston PA-C 05/03/2024 9:03 AM

## 2024-05-04 ENCOUNTER — Inpatient Hospital Stay (HOSPITAL_COMMUNITY)

## 2024-05-04 ENCOUNTER — Encounter (HOSPITAL_COMMUNITY): Payer: Self-pay | Admitting: Internal Medicine

## 2024-05-04 DIAGNOSIS — I4891 Unspecified atrial fibrillation: Secondary | ICD-10-CM | POA: Diagnosis not present

## 2024-05-04 DIAGNOSIS — J90 Pleural effusion, not elsewhere classified: Secondary | ICD-10-CM | POA: Diagnosis not present

## 2024-05-04 DIAGNOSIS — R000 Tachycardia, unspecified: Secondary | ICD-10-CM

## 2024-05-04 DIAGNOSIS — I5032 Chronic diastolic (congestive) heart failure: Secondary | ICD-10-CM | POA: Diagnosis not present

## 2024-05-04 DIAGNOSIS — I272 Pulmonary hypertension, unspecified: Secondary | ICD-10-CM | POA: Diagnosis not present

## 2024-05-04 LAB — CBC
HCT: 36.5 % (ref 36.0–46.0)
Hemoglobin: 11.6 g/dL — ABNORMAL LOW (ref 12.0–15.0)
MCH: 26.1 pg (ref 26.0–34.0)
MCHC: 31.8 g/dL (ref 30.0–36.0)
MCV: 82 fL (ref 80.0–100.0)
Platelets: 333 K/uL (ref 150–400)
RBC: 4.45 MIL/uL (ref 3.87–5.11)
RDW: 15 % (ref 11.5–15.5)
WBC: 13 K/uL — ABNORMAL HIGH (ref 4.0–10.5)
nRBC: 0 % (ref 0.0–0.2)

## 2024-05-04 LAB — BASIC METABOLIC PANEL WITH GFR
Anion gap: 6 (ref 5–15)
BUN: 27 mg/dL — ABNORMAL HIGH (ref 8–23)
CO2: 31 mmol/L (ref 22–32)
Calcium: 8.9 mg/dL (ref 8.9–10.3)
Chloride: 90 mmol/L — ABNORMAL LOW (ref 98–111)
Creatinine, Ser: 0.82 mg/dL (ref 0.44–1.00)
GFR, Estimated: >60 mL/min (ref >60)
Glucose, Bld: 95 mg/dL (ref 70–99)
Potassium: 4.6 mmol/L (ref 3.5–5.1)
Sodium: 127 mmol/L — ABNORMAL LOW (ref 135–145)

## 2024-05-04 LAB — GLUCOSE, CAPILLARY: Glucose-Capillary: 114 mg/dL — ABNORMAL HIGH (ref 70–99)

## 2024-05-04 LAB — CYTOLOGY - NON PAP

## 2024-05-04 MED ORDER — HYOSCYAMINE SULFATE 0.125 MG SL SUBL: 0.1250 mg | SUBLINGUAL_TABLET | Freq: Four times a day (QID) | SUBLINGUAL | Status: DC | PRN

## 2024-05-04 MED ORDER — ENSURE PLUS HIGH PROTEIN PO LIQD
237.0000 mL | Freq: Three times a day (TID) | ORAL | Status: DC
  Administered 2024-05-04 – 2024-05-06 (×6): 237 mL via ORAL

## 2024-05-04 MED ORDER — TAMSULOSIN HCL 0.4 MG PO CAPS
0.4000 mg | ORAL_CAPSULE | Freq: Every day | ORAL | Status: DC
  Administered 2024-05-04 – 2024-05-06 (×3): 0.4 mg via ORAL
  Filled 2024-05-04 (×3): qty 1

## 2024-05-04 MED ORDER — OXYBUTYNIN CHLORIDE 5 MG PO TABS: 2.5000 mg | ORAL_TABLET | Freq: Three times a day (TID) | ORAL | Status: DC | PRN

## 2024-05-04 MED ORDER — TIZANIDINE HCL 2 MG PO TABS
2.0000 mg | ORAL_TABLET | Freq: Every day | ORAL | Status: DC
  Administered 2024-05-04 – 2024-05-05 (×2): 2 mg via ORAL
  Filled 2024-05-04 (×3): qty 1

## 2024-05-04 MED ORDER — SENNOSIDES-DOCUSATE SODIUM 8.6-50 MG PO TABS
2.0000 | ORAL_TABLET | Freq: Two times a day (BID) | ORAL | Status: DC
  Administered 2024-05-04 – 2024-05-06 (×5): 2 via ORAL
  Filled 2024-05-04 (×5): qty 2

## 2024-05-04 MED ORDER — POLYETHYLENE GLYCOL 3350 17 G PO PACK
17.0000 g | PACK | Freq: Every day | ORAL | Status: DC
  Administered 2024-05-04 – 2024-05-06 (×3): 17 g via ORAL
  Filled 2024-05-04 (×3): qty 1

## 2024-05-04 NOTE — Evaluation (Signed)
 Occupational Therapy Evaluation Patient Details Name: Hailey [name] MRN: [id number] DOB: 1946/05/24 Today's Date: 05/04/2024   History of Present Illness   78 yo F  adm 04/30/2024 with SOB, bil pleural effusions, acute on chronic CHF, mixed pulmonary HTN, acute respiratory failure. 11/28 & 12/1 thoracentesis. 12/1 RHC. PMHx: CAD, Afib, tachy-brady syndrome s/p PPM, HFpEF, HTN, DM2, hyperthyroidism, OSA.     Clinical Impressions Pt presents with decline in function and safety with ADLs and ADL mobility with impaired strength, balance, endurance. PTA pt lives with her spouse and was Ind with ADLs, does not drive, cooks, children assist with grocery shopping and all transportation, assist with IADLs from spouse's PCA, pt helps care for spouse (has Alzheimer's and Parkinson's per pt) with ADLs who uses W/C, he also has caregivers 2-3 days wk x 6 hours who help with IADLs. Pt currently requires CGA-min A with UB and LB ADLs, CGA with mobility HHA with impaired ballance with posterior LOB/leaning unsupported. Pt would benefit from acute OT services to address impairments to maximize level of function and safety     If plan is discharge home, recommend the following:   A little help with bathing/dressing/bathroom;A little help with walking and/or transfers;Assistance with cooking/housework;Assist for transportation;Help with stairs or ramp for entrance     Functional Status Assessment   Patient has had a recent decline in their functional status and demonstrates the ability to make significant improvements in function in a reasonable and predictable amount of time.     Equipment Recommendations   None recommended by OT     Recommendations for Other Services         Precautions/Restrictions   Precautions Precautions: Fall Recall of Precautions/Restrictions: Intact Restrictions Weight Bearing Restrictions Per Provider Order: No     Mobility Bed Mobility                General bed mobility comments: pt in chair    Transfers Overall transfer level: Needs assistance Equipment used: 1 person hand held assist Transfers: Sit to/from Stand Sit to Stand: Contact guard assist                  Balance Overall balance assessment: Needs assistance   Sitting balance-Leahy Scale: Fair Sitting balance - Comments: some posterior leaning without support of back of chair and standing   Standing balance support: Single extremity supported, During functional activity Standing balance-Leahy Scale: Poor                             ADL either performed or assessed with clinical judgement   ADL Overall ADL's : Needs assistance/impaired Eating/Feeding: Independent   Grooming: Wash/dry hands;Wash/dry face;Contact guard assist;Standing   Upper Body Bathing: Contact guard assist;Sitting   Lower Body Bathing: Minimal assistance   Upper Body Dressing : Contact guard assist;Sitting   Lower Body Dressing: Minimal assistance   Toilet Transfer: Contact guard assist;Ambulation;Regular Toilet;Grab bars;Cueing for safety   Toileting- Clothing Manipulation and Hygiene: Contact guard assist;Sit to/from stand       Functional mobility during ADLs: Contact guard assist;Cueing for safety       Vision Baseline Vision/History: 1 Wears glasses Ability to See in Adequate Light: 0 Adequate Patient Visual Report: No change from baseline       Perception         Praxis         Pertinent Vitals/Pain Pain Assessment Pain Assessment: Faces Faces Pain Scale:  Hurts a little bit Pain Location: back Pain Descriptors / Indicators: Aching Pain Intervention(s): Limited activity within patient's tolerance, Premedicated before session, Repositioned, Monitored during session     Extremity/Trunk Assessment Upper Extremity Assessment Upper Extremity Assessment: Generalized weakness;Right hand dominant   Lower Extremity Assessment Lower Extremity  Assessment: Defer to PT evaluation   Cervical / Trunk Assessment Cervical / Trunk Assessment: Kyphotic   Communication Communication Communication: Impaired Factors Affecting Communication: Hearing impaired   Cognition Arousal: Alert Behavior During Therapy: Flat affect                                 Following commands: Intact       Cueing  General Comments   Cueing Techniques: Verbal cues      Exercises     Shoulder Instructions      Home Living Family/patient expects to be discharged to:: Private residence Living Arrangements: Spouse/significant other Available Help at Discharge: Family;Available PRN/intermittently Type of Home: House Home Access: Level entry     Home Layout: One level     Bathroom Shower/Tub: Producer, Television/film/video: Standard     Home Equipment: Agricultural Consultant (2 wheels);Shower seat   Additional Comments: helps care for spouse with ADLs who uses W/C, he also has caregivers 2-3 days wk x 6 hours who help with IADLs      Prior Functioning/Environment Prior Level of Function : Independent/Modified Independent             Mobility Comments: walks without AD unless in pain then utilizes it, ADLs Comments: Ind with ADLs, does not drive, cooks, children assist with grocery shopping and all transportation, assist with IADLs from spouse's PCA    OT Problem List: Impaired balance (sitting and/or standing);Decreased activity tolerance;Decreased strength;Decreased knowledge of use of DME or AE   OT Treatment/Interventions:        OT Goals(Current goals can be found in the care plan section)   Acute Rehab OT Goals Patient Stated Goal: go home OT Goal Formulation: With patient Time For Goal Achievement: 05/18/24 Potential to Achieve Goals: Good ADL Goals Pt Will Perform Grooming: with supervision;with set-up;standing Pt Will Perform Upper Body Bathing: with supervision;with set-up;sitting Pt Will Perform Lower  Body Bathing: with contact guard assist;with supervision Pt Will Perform Upper Body Dressing: with supervision;with set-up;sitting Pt Will Perform Lower Body Dressing: with contact guard assist;with supervision Pt Will Transfer to Toilet: with supervision;with modified independence;ambulating Pt Will Perform Toileting - Clothing Manipulation and hygiene: with supervision;with modified independence;sit to/from stand Pt Will Perform Tub/Shower Transfer: with contact guard assist;with supervision;ambulating;shower seat;grab bars   OT Frequency:  Min 2X/week    Co-evaluation              AM-PAC OT 6 Clicks Daily Activity     Outcome Measure Help from another person eating meals?: None Help from another person taking care of personal grooming?: A Little Help from another person toileting, which includes using toliet, bedpan, or urinal?: A Little Help from another person bathing (including washing, rinsing, drying)?: A Little Help from another person to put on and taking off regular upper body clothing?: A Little Help from another person to put on and taking off regular lower body clothing?: A Little 6 Click Score: 19   End of Session Equipment Utilized During Treatment: Gait belt Nurse Communication: Mobility status  Activity Tolerance: Patient tolerated treatment well Patient left: in chair;with call bell/phone  within reach;Other (comment);with chair alarm set (MD present)  OT Visit Diagnosis: Other abnormalities of gait and mobility (R26.89);Muscle weakness (generalized) (M62.81)                Time: 8840-8774 OT Time Calculation (min): 26 min Charges:  OT General Charges $OT Visit: 1 Visit OT Evaluation $OT Eval Moderate Complexity: 1 Mod OT Treatments $Self Care/Home Management : 8-22 mins    Jacques Karna Loose 05/04/2024, 1:30 PM

## 2024-05-04 NOTE — Progress Notes (Signed)
 Patient has been in Atrial fibrillation since last night

## 2024-05-04 NOTE — Care Management Important Message (Signed)
 Important Message  Patient Details  Name: Hailey [name] MRN: [id number] Date of Birth: 1945-11-04   Important Message Given:  Yes - Medicare IM     Jennie Laneta Dragon 05/04/2024, 10:37 AM

## 2024-05-04 NOTE — Progress Notes (Signed)
 Physical Therapy Treatment Patient Details Name: Hailey [name] MRN: [id number] DOB: December 30, 1945 Today's Date: 05/04/2024   History of Present Illness 78 yo F  adm 04/30/2024 with SOB, bil pleural effusions, acute on chronic CHF, mixed pulmonary HTN, acute respiratory failure. 11/28 & 12/1 thoracentesis. 12/1 RHC. PMHx: CAD, Afib, tachy-brady syndrome s/p PPM, HFpEF, HTN, DM2, hyperthyroidism, OSA.    PT Comments  Pt pleasant and reports living at home with spouse whom she helps care for. Pt uses RW intermittently and demonstrates decreased balance and activity tolerance, maintaining SPO2 >90% on RA this date. Pt would benefit from acute therapy to maximize strength and independence as well as HHPT at D/C. Pt educated for need to use RW at all times for balance and safety. Encouraged OOB and mobility acutely.     If plan is discharge home, recommend the following: A little help with walking and/or transfers;A little help with bathing/dressing/bathroom;Assistance with cooking/housework;Assist for transportation   Can travel by private vehicle        Equipment Recommendations  None recommended by PT    Recommendations for Other Services OT consult     Precautions / Restrictions Precautions Precautions: Fall Recall of Precautions/Restrictions: Intact     Mobility  Bed Mobility Overal bed mobility: Modified Independent             General bed mobility comments: with rail and HOB 30 degrees    Transfers Overall transfer level: Needs assistance   Transfers: Sit to/from Stand Sit to Stand: Contact guard assist           General transfer comment: hand held assist to rise from surface    Ambulation/Gait Ambulation/Gait assistance: Contact guard assist Gait Distance (Feet): 200 Feet Assistive device: 1 person hand held assist Gait Pattern/deviations: Step-through pattern, Decreased stride length, Trunk flexed   Gait velocity interpretation: <1.8 ft/sec, indicate of risk  for recurrent falls   General Gait Details: pt with kyphotic posture, cues for looking up and hand held assist for slight steadying during gait. pt able to self-regulate distance   Stairs             Wheelchair Mobility     Tilt Bed    Modified Rankin (Stroke Patients Only)       Balance Overall balance assessment: Needs assistance   Sitting balance-Leahy Scale: Fair Sitting balance - Comments: posterior LOB with donning socks EOB   Standing balance support: Single extremity supported Standing balance-Leahy Scale: Poor                              Communication Communication Communication: Impaired Factors Affecting Communication: Hearing impaired  Cognition Arousal: Alert Behavior During Therapy: Flat affect   PT - Cognitive impairments: Safety/Judgement                       PT - Cognition Comments: decreased awareness of current function Following commands: Intact      Cueing Cueing Techniques: Verbal cues  Exercises      General Comments        Pertinent Vitals/Pain Pain Assessment Pain Assessment: 0-10 Pain Score: 3  Pain Location: back Pain Descriptors / Indicators: Aching Pain Intervention(s): Monitored during session, Premedicated before session, Repositioned    Home Living Family/patient expects to be discharged to:: Private residence Living Arrangements: Spouse/significant other Available Help at Discharge: Family;Available PRN/intermittently Type of Home: House Home Access: Level entry  Home Layout: One level Home Equipment: Agricultural Consultant (2 wheels);Shower seat Additional Comments: helps care for spouse with ADLs who uses WC, he also has caregivers who help with IADLs    Prior Function            PT Goals (current goals can now be found in the care plan section) Acute Rehab PT Goals Patient Stated Goal: return home PT Goal Formulation: With patient Time For Goal Achievement: 05/18/24 Potential  to Achieve Goals: Good    Frequency    Min 2X/week      PT Plan      Co-evaluation              AM-PAC PT 6 Clicks Mobility   Outcome Measure  Help needed turning from your back to your side while in a flat bed without using bedrails?: A Little Help needed moving from lying on your back to sitting on the side of a flat bed without using bedrails?: A Little Help needed moving to and from a bed to a chair (including a wheelchair)?: A Little Help needed standing up from a chair using your arms (e.g., wheelchair or bedside chair)?: A Little Help needed to walk in hospital room?: A Little Help needed climbing 3-5 steps with a railing? : A Lot 6 Click Score: 17    End of Session   Activity Tolerance: Patient tolerated treatment well Patient left: in chair;with call bell/phone within reach Nurse Communication: Mobility status PT Visit Diagnosis: Other abnormalities of gait and mobility (R26.89);Difficulty in walking, not elsewhere classified (R26.2);Muscle weakness (generalized) (M62.81)     Time: 8884-8862 PT Time Calculation (min) (ACUTE ONLY): 22 min  Charges:      PT General Charges $$ ACUTE PT VISIT: 1 Visit                     Lenoard SQUIBB, PT Acute Rehabilitation Services Office: [phone]    Lenoard NOVAK Anicia Leuthold 05/04/2024, 11:57 AM

## 2024-05-04 NOTE — Plan of Care (Signed)

## 2024-05-04 NOTE — Progress Notes (Signed)
 Heart Failure Navigator Progress Note  Assessed for Heart & Vascular TOC clinic readiness.  Patient does not meet criteria due to she is an Advanced Heart Failure Team patient of Dr. Bensimhon. .   Navigator will sign off at this time.   Stephane Haddock, BSN, Scientist, Clinical (histocompatibility And Immunogenetics) Only

## 2024-05-04 NOTE — Progress Notes (Signed)
  Progress Note  Patient Name: Hailey [name] Date of Encounter: 05/04/2024 [facility] Cardiologist: Jerel Balding, MD   Interval Summary    Doing well this morning No acute complaints Remains in rate controlled A-Fib Discussed results of RHC with plans to follow up with pulm HTN clinic as outpatient  Vital Signs Vitals:   05/03/24 2012 05/03/24 2305 05/04/24 0438 05/04/24 0753  BP: (!) 145/50 (!) 128/52 (!) 127/50 (!) 141/62  Pulse:  84 72   Resp: 18 20 18 18   Temp: 98.4 F (36.9 C) 98 F (36.7 C) 98.1 F (36.7 C)   TempSrc: Oral Oral Oral Oral  SpO2: 96% 96% 94%   Weight:      Height:        Intake/Output Summary (Last 24 hours) at 05/04/2024 0806 Last data filed at 05/04/2024 0440 Gross per 24 hour  Intake --  Output 590 ml  Net -590 ml      04/30/2024   10:48 AM 04/23/2024    1:10 PM 04/14/2024    3:35 PM  Last 3 Weights  Weight (lbs) 103 lb 103 lb 12.8 oz 105 lb  Weight (kg) 46.72 kg 47.083 kg 47.628 kg     Telemetry/ECG  Rate controlled atrial fib, HR 80-90s - Personally Reviewed  Physical Exam  GEN: No acute distress, on room air.   Neck: No JVD Cardiac: iRRR  Respiratory: decreased breath sounds. GI: Soft, nontender, non-distended  MS: No edema  Assessment & Plan   Chronic HFpEF Pulmonary Hypertension Bilateral Pleural Effusions, R  > L Home meds: spironolactone  25 mg daily, Lasix  80 mg BID with 20 mEq K TID, Lopressor  12.5 mg BID Recent echo: LVEF 60-65%, severely enlarged RV with normal RV function and severely elevated PASP, mild MR, and severe TR. RVSP had increased from 55.6 mmHg to 81.6 mmHg  Outpatient proBNP 2,352, BNP 151 Presented with worsening SOB Underwent thoracentesis 11/28 with 1.1 L out from right Repeat thoracentesis today 12/1 with 900 cc out from right  BUN up today RHC 05/03/2024: mild PAH, normal left-sided filling pressures and cardiac output. PA pressures and PCWP improved from prior cath. No change in PVR  with addition of low dose PDE-5i Increasing sildenafil  to 40 mg TID Arranging follow up with Dr. Cherrie in [facility] HTN clinic to discuss trial of macitentan vs sotatercept    Nonobstructive CAD LHC in 2019 showed non-obstructive disease  EKG shows no acute ischemic changes  Not on ASA due to Eliquis  use Not on statin due to intolerance   Permanent A-Fib s/p ablation 2014, 2015 Home meds: Eliquis  5 mg BID, Lopressor  12.5 mg BID Continue Eliquis  5 mg BID Not on any AV nodal blockers at the moment, rates controlled   Tachy-brady syndrome s/p PPM Last device check in 02/2024 showed RV was paced 93% of the time  Continue to follow with Dr. Balding as outpatient   Per primary Hyperthyroidism Diabetes Electrolyte disturbances Urinary retention Pleural effusions Abdominal pain  For questions or updates, please contact [facility] Please consult [email or web address] for contact info under   Signed, Waddell DELENA Donath, PA-C

## 2024-05-04 NOTE — Progress Notes (Signed)
 [facility] Progress Note Patient: Hailey [name] FMW:[id number] DOB: [birth year]  DOA: 04/30/2024 DOS: the patient was seen and examined on 05/04/2024  Brief Hospital Course: Hailey [name] is a 78 y.o. female with PMH of t of CAD (70% RCA stenosis treated medically), Afib, tachy-brady syndrome s/p PPM, HFpEF (EF 60-65% at [facility] in 11/2023), HTN, DM2, hyperthyroidism, OSA, and most recent admission to [facility] for HFpEF exacerbation treated with IV lasix  40mg  BID who presents with shortness of breath progressively worsening past week or so and also, urinary retention Underwent thoracentesis x 2. Underwent right heart cath as well.   Cardiology following.  Assessment and plan: Acute on chronic diastolic CHF. Mixed pulmonary hypertension-mixed PAH/pulmonary venous hypertension Acute hypoxic respiratory failure: Presents with complaints of progressively worsening shortness of breath. Underwent thoracentesis x 2. Cardiology consulted Was treated with IV diuresis.  Now on none. Management per cardiology.  Exudative bilateral pleural effusion. Pleuritic chest pain. Underwent thoracentesis 11/28.  1.1 L fluid removed. Repeat procedure 12/1.  900 mL fluid removed. Cytology negative.  Cultures so far negative from the past fluid. Concern for possible inflammatory etiology. Etiology of the chest pain possibly pleurisy. Pleural fluid is exudative in nature Initiating steroids to support inflammation. Follow-up on cultures.  Positive ANA. Further workup sent out.  Urinary retention. Patient also had complained about difficulty urinating. Bladder scan in the ED was showing 300 cc. Due to retention and abdominal pain a Foley catheter was inserted in ED. Foley catheter is removed on 12/2 Oxybutynin  as needed. Flomax initiated as well.   Permanent A-fib Tachybradycardia syndrome status post PPM: Patient is predominantly V-paced. Currently on Eliquis . Currently rate controlled  without medication.  Leukocytosis: Monitor for now.  CAD-nonobstructive: not on aspirin  due to DOAC, not on a statin due to intolerance  Hyperthyroidism Goiter: On methimazole .  Last TSH 0.07, free T4 0.9 Normal on 11/3. No changes  T2DM: Continue sliding scale insulin .  Constipation: Abdominal pain. CT abdomen shows no acute abnormality. Continue with bowel regimen Levsin as needed.   Subjective: Improvement in chest pain.  Improvement in abdominal pain.  No nausea no vomiting.  Passing gas.  Seen ambulating in the hallway.  Physical Exam: Basal crackles. S1-S2 present Bowel sound present improvement in tenderness. No edema.  Data Reviewed: I have Reviewed nursing notes, Vitals, and Lab results. Since last encounter, pertinent lab results CBC and BMP   . I have ordered test including CBC and BMP  . I have discussed pt's care plan and test results with cardiology  .   Disposition: Status is: Inpatient Remains inpatient appropriate because: Monitor for improvement in volume status Potential home tomorrow or day after apixaban  (ELIQUIS ) tablet 5 mg    Family Communication: Discussed with daughter Olam Level of care: Telemetry   Vitals:   05/04/24 0900 05/04/24 1000 05/04/24 1145 05/04/24 1300  BP:      Pulse: 93 99  83  Resp:      Temp:      TempSrc:    Oral  SpO2: 94% 94% 93% 95%  Weight:      Height:         Author: Yetta Blanch, MD 05/04/2024 6:48 PM  Please look on [email or web address] to find out who is on call.

## 2024-05-04 NOTE — Progress Notes (Signed)
 PT Cancellation Note  Patient Details Name: Hailey [name] MRN: [id number] DOB: 02-02-1946   Cancelled Treatment:    Reason Eval/Treat Not Completed: Patient at procedure or test/unavailable   Carlyann Placide B Traxton Kolenda 05/04/2024, 10:54 AM Lenoard SQUIBB, PT Acute Rehabilitation Services Office: [phone]

## 2024-05-04 NOTE — Inpatient Diabetes Management (Signed)
 Inpatient Diabetes Program Recommendations  AACE/ADA: New Consensus Statement on Inpatient Glycemic Control   Target Ranges:  Prepandial:   less than 140 mg/dL      Peak postprandial:   less than 180 mg/dL (1-2 hours)      Critically ill patients:  140 - 180 mg/dL    Latest Reference Range & Units 05/02/24 08:02 05/02/24 18:29 05/03/24 07:40 05/03/24 18:28 05/04/24 07:52  Glucose-Capillary 70 - 99 mg/dL 814 (H) 718 (H) 864 (H) 392 (H) 114 (H)   Review of Glycemic Control  Diabetes history: DM2 Outpatient Diabetes medications: None Current orders for Inpatient glycemic control: Prednisone  20 mg QAM  Inpatient Diabetes Program Recommendations:    Insulin : CBG 392 mg/dl at 81:71 on 87/8/74.   Please consider ordering Novolog  0-9 units TID with meals.   Thanks, Earnie Gainer, RN, MSN, CDCES Diabetes Coordinator Inpatient Diabetes Program [phone] (Team Pager from 8am to 5pm)

## 2024-05-04 NOTE — Procedures (Signed)
 Modified Barium Swallow Study  Patient Details  Name: Hailey [name] MRN: [id number] Date of Birth: 06/01/1946  Today's Date: 05/04/2024  Modified Barium Swallow completed.  Full report located under Chart Review in the Imaging Section.  History of Present Illness Patient is a 77 y.o. female who presented to [facility] on 04/30/24 from home with SOB. She was admitted with progressive dyspnea and pleural effusion, suspected to be acute on chronic diastolic CHF exacerbation with pulmonary hypertension. Cardiology consulted. CXR on 11/28 showed new large right pleural effusion, with right lung compressive atelectasis but left lung clear. She underwent right thoracentesis 11/28 and 12/1 yielding 1.1 liters of pleural fluid and repeat CXR showed decreased right pleural effusion.12/1 RHC. SLP swallow evaluation ordered on 11/29 due to patient's c/o difficulty swallowing pills and some solid foods. MBS ordered to rule out aspiration. PMH: CAD, a-fib, HFpEF, HTN, DM-2, hyperthyroidism s/p right hemithyroidectomy 09/20/2022, 2023 barium esophagram indicating Mild esophageal deviation to the right and mild circumferential narrowing of the esophageal lumen in the lower neck by the patient's multinodular goiter, OSA, hearing loss.   Clinical Impression Patient presents with an oropharyngeal swallow that is within functional limits per this MBS. No aspiration or penetration was observed across any of the tested consistencies. SLP recommending patient continue regular/thin diet as tolerated. No SLP follow up is needed. SLP signing off at this time. Factors that may increase risk of adverse event in presence of aspiration Noe & Lianne 2021):    DIGEST Swallow Severity Rating*  Safety: 0  Efficiency: 0  Overall Pharyngeal Swallow Severity: 0 1: mild; 2: moderate; 3: severe; 4: profound  *The Dynamic Imaging Grade of Swallowing Toxicity is standardized for the head and neck cancer population, however, demonstrates  promising clinical applications across populations to standardize the clinical rating of pharyngeal swallow safety and severity.   Swallow Evaluation Recommendations Recommendations: PO diet PO Diet Recommendation: Regular;Thin liquids (Level 0) Liquid Administration via: Spoon;Cup;Straw Medication Administration: Other (Comment) (As tolerated) Supervision: Patient able to self-feed Swallowing strategies  : Slow rate;Small bites/sips Postural changes: Position pt fully upright for meals;Stay upright 30-60 min after meals Oral care recommendations: Oral care BID (2x/day)   Damien Hy  Graduate SLP Clinican

## 2024-05-04 NOTE — TOC Progression Note (Signed)
 Transition of Care [facility]) - Progression Note    Patient Details  Name: Hailey [name] MRN: [id number] Date of Birth: 05/08/1946  [facility] CM/SW Contact  Graves-Bigelow, Erminio Deems, RN Phone Number: 05/04/2024, 5:16 PM  Clinical Narrative:  PT/OT recommendations are for [facility] PT/OT. ICM did discuss home health services with the patient and she is agreeable to services. Medicare.gov list provided and the patient has chosen [facility]. Referral submitted via the hub. [facility] is aware to contact the daughter [name] for visit times. Patient may benefit from [facility] RN, ICM will continue to follow as she progresses.    Expected Discharge Plan: Home w Home Health Services Barriers to Discharge: Continued Medical Work up  Expected Discharge Plan and Services In-house Referral: NA Discharge Planning Services: CM Consult Post Acute Care Choice: Home Health Living arrangements for the past 2 months: Single Family Home                   DME Agency: NA     HH Arranged: PT, OT HH Agency: [facility] Date HH Agency Contacted: 05/04/24 Time HH Agency Contacted: 1715 Representative spoke with at [facility] Agency: Hub  Social Drivers of Health (SDOH) Interventions SDOH Screenings   Food Insecurity: No Food Insecurity (04/30/2024)  Housing: Low Risk  (04/30/2024)  Transportation Needs: No Transportation Needs (04/30/2024)  Utilities: Not At Risk (04/30/2024)  Depression (PHQ2-9): Low Risk  (06/18/2023)  Financial Resource Strain: Low Risk (11/03/2023)   Received from [facility]  Social Connections: Socially Integrated (04/30/2024)  Tobacco Use: Low Risk  (04/30/2024)   Readmission Risk Interventions     No data to display

## 2024-05-05 ENCOUNTER — Ambulatory Visit

## 2024-05-05 DIAGNOSIS — I480 Paroxysmal atrial fibrillation: Secondary | ICD-10-CM

## 2024-05-05 DIAGNOSIS — I4821 Permanent atrial fibrillation: Secondary | ICD-10-CM | POA: Diagnosis not present

## 2024-05-05 DIAGNOSIS — I4891 Unspecified atrial fibrillation: Secondary | ICD-10-CM | POA: Diagnosis not present

## 2024-05-05 DIAGNOSIS — I5032 Chronic diastolic (congestive) heart failure: Secondary | ICD-10-CM | POA: Diagnosis not present

## 2024-05-05 DIAGNOSIS — J90 Pleural effusion, not elsewhere classified: Secondary | ICD-10-CM | POA: Diagnosis not present

## 2024-05-05 DIAGNOSIS — I5033 Acute on chronic diastolic (congestive) heart failure: Secondary | ICD-10-CM | POA: Diagnosis not present

## 2024-05-05 DIAGNOSIS — I272 Pulmonary hypertension, unspecified: Secondary | ICD-10-CM | POA: Diagnosis not present

## 2024-05-05 LAB — CULTURE, BODY FLUID W GRAM STAIN -BOTTLE: Culture: NO GROWTH

## 2024-05-05 LAB — BASIC METABOLIC PANEL WITH GFR
Anion gap: 5 (ref 5–15)
BUN: 26 mg/dL — ABNORMAL HIGH (ref 8–23)
CO2: 32 mmol/L (ref 22–32)
Calcium: 9 mg/dL (ref 8.9–10.3)
Chloride: 91 mmol/L — ABNORMAL LOW (ref 98–111)
Creatinine, Ser: 0.81 mg/dL (ref 0.44–1.00)
GFR, Estimated: >60 mL/min (ref >60)
Glucose, Bld: 172 mg/dL — ABNORMAL HIGH (ref 70–99)
Potassium: 4.2 mmol/L (ref 3.5–5.1)
Sodium: 128 mmol/L — ABNORMAL LOW (ref 135–145)

## 2024-05-05 LAB — CBC
HCT: 35.6 % — ABNORMAL LOW (ref 36.0–46.0)
Hemoglobin: 11.2 g/dL — ABNORMAL LOW (ref 12.0–15.0)
MCH: 25.7 pg — ABNORMAL LOW (ref 26.0–34.0)
MCHC: 31.5 g/dL (ref 30.0–36.0)
MCV: 81.7 fL (ref 80.0–100.0)
Platelets: 302 K/uL (ref 150–400)
RBC: 4.36 MIL/uL (ref 3.87–5.11)
RDW: 15.2 % (ref 11.5–15.5)
WBC: 10.5 K/uL (ref 4.0–10.5)
nRBC: 0 % (ref 0.0–0.2)

## 2024-05-05 LAB — GLUCOSE, CAPILLARY
Glucose-Capillary: 172 mg/dL — ABNORMAL HIGH (ref 70–99)
Glucose-Capillary: 198 mg/dL — ABNORMAL HIGH (ref 70–99)
Glucose-Capillary: 237 mg/dL — ABNORMAL HIGH (ref 70–99)

## 2024-05-05 MED ORDER — BISACODYL 10 MG RE SUPP
10.0000 mg | Freq: Once | RECTAL | Status: AC
  Administered 2024-05-05: 10 mg via RECTAL
  Filled 2024-05-05: qty 1

## 2024-05-05 NOTE — Plan of Care (Signed)

## 2024-05-05 NOTE — Progress Notes (Signed)
 Patient received bladder can due to no occurrences since the foley was removed. Bladder scan revealed 120 ml in bladder. Patient used BSC and voided 50. 70 mL remain in bladder. Patient complains of pain in her lower right back and lower abdomen.

## 2024-05-05 NOTE — Progress Notes (Signed)
 PROGRESS NOTE    Hailey [name]  FMW:[id number] DOB: [birth year] DOA: 04/30/2024 PCP: [name], NP    Brief Narrative:  78 year old with history of active disease treated medically, A-fib tachybradycardia syndrome status post pacemaker, heart failure with preserved ejection fraction and known ejection fraction 60 to 65%, hypertension, type 2 diabetes, hyperthyroidism, sleep apnea presented to the hospital with shortness of breath, progressively worsening over the past week and also urinary retention. Underwent thoracentesis x 2 Underwent right heart cath.  Followed by cardiology.  Diuresis done.  Subjective: Patient seen and examined.  She tells me that she still has about 6 out of 10 pain withsome shortness of breath mostly on the right side of the chest. Remains on room air.  She did walk somehow. Foley catheter was removed yesterday, she has some success on urinating today.  No retention. Patient has not have bowel movement for the last 4 days, she wishes her Dulcolax suppository.    Assessment & Plan:   Acute on chronic diastolic congestive heart failure, pulmonary hypertension and pulmonary venous hypertension Acute hypoxic respiratory failure Presented with progressive shortness of breath, this was likely related to right-sided heart failure. Thoracentesis right lungs 11/28, 1.1 L removed Thoracentesis right lungs 12/1, 900 mL removed Treated with Lasix , currently on hold. Started on sildenafil . Cytology negative.  Cultures negative. Suspected inflammatory etiology Exudative Starting on steroids, ANA positive, anti-Jo1 antibody, beta-2 glycoprotein, lupus anticoagulant, cardiolipin antibodies pending. Continue mobilizing with PT OT.  Use incentive spirometry.  If any positive serological test for connective tissue disorder, will start patient on prednisone  and refer to rheumatology on discharge.  Acute urinary retention: Likely secondary to impaired mobility  and constipation. Foley catheter removed 12/2.  Continue to monitor for output. Oxybutynin , Flomax Senokot twice daily, MiraLAX  once daily, Dulcolax 1 dose today.  Permanent A-fib with tachybradycardia syndrome status post pacemaker: V paced rhythm.  On Eliquis .  Rate controlled without medications.  Nonobstructive coronary artery disease: Already on Eliquis .  Hyperthyroidism: On methimazole  that is continued.  TSH 0.07.  Hyponatremia: Likely related to right-sided fluid overload.  Sodium remains stable.  Monitoring.    DVT prophylaxis:  apixaban  (ELIQUIS ) tablet 5 mg   Code Status: Full code Family Communication: None at the bedside Disposition Plan: Status is: Inpatient Remains inpatient appropriate because: Still symptomatic     Consultants:  Cardiology  Procedures:  Thoracentesis  Antimicrobials:  None     Objective: Vitals:   05/05/24 0419 05/05/24 0717 05/05/24 0943 05/05/24 1147  BP: (!) 96/56 (!) 92/45 (!) 128/59 139/63  Pulse:  62 67 81  Resp: 17 17 18 18   Temp: 98.2 F (36.8 C) 98 F (36.7 C)  98.2 F (36.8 C)  TempSrc: Oral Axillary  Oral  SpO2:  96% 94% 98%  Weight:      Height:        Intake/Output Summary (Last 24 hours) at 05/05/2024 1219 Last data filed at 05/05/2024 0422 Gross per 24 hour  Intake 360 ml  Output 700 ml  Net -340 ml   Filed Weights   04/30/24 1048  Weight: 46.7 kg    Examination:  General exam: Appears calm and comfortable.  Pleasant interaction.  Mildly anxious. Respiratory system: She does have some basal crackles.  Poor inspiratory effort.  On room air. Cardiovascular system: S1 & S2 heard, RRR.  Pacemaker present left precordium. Gastrointestinal system: Abdomen is nondistended, soft and nontender. No organomegaly or masses felt. Normal bowel sounds heard. Central nervous system:  Alert and oriented. No focal neurological deficits. Extremities: Symmetric 5 x 5 power.    Data Reviewed: I have personally  reviewed following labs and imaging studies  CBC: Recent Labs  Lab 04/30/24 1830 05/02/24 0318 05/03/24 0622 05/03/24 1149 05/03/24 1154 05/04/24 0431 05/05/24 0415  WBC 17.2* 10.8* 11.2*  --   --  13.0* 10.5  HGB 13.4 12.2 11.8* 13.6  13.6 13.3 11.6* 11.2*  HCT 42.9 39.0 38.0 40.0  40.0 39.0 36.5 35.6*  MCV 82.3 81.8 83.2  --   --  82.0 81.7  PLT 415* 346 335  --   --  333 302   Basic Metabolic Panel: Recent Labs  Lab 05/01/24 0935 05/02/24 0318 05/02/24 1054 05/03/24 0622 05/03/24 1149 05/03/24 1154 05/04/24 0431 05/05/24 0415  NA  --  128* 128* 129* 127*  126* 127* 127* 128*  K  --  3.6 4.7 4.5 4.7  4.7 4.7 4.6 4.2  CL  --  88* 91* 91*  --   --  90* 91*  CO2  --  28 28 29   --   --  31 32  GLUCOSE  --  127* 233* 113*  --   --  95 172*  BUN  --  20 20 19   --   --  27* 26*  CREATININE  --  1.12* 0.97 0.81  --   --  0.82 0.81  CALCIUM   --  8.5* 8.6* 8.6*  --   --  8.9 9.0  MG 1.8  --   --   --   --   --   --   --    GFR: Estimated Creatinine Clearance: 38.4 mL/min (by C-G formula based on SCr of 0.81 mg/dL). Liver Function Tests: Recent Labs  Lab 05/02/24 1054  AST 14*  ALT 10  ALKPHOS 107  BILITOT 1.3*  PROT 5.9*  ALBUMIN  2.7*   Recent Labs  Lab 05/02/24 1054  LIPASE 23   No results for input(s): AMMONIA in the last 168 hours. Coagulation Profile: No results for input(s): INR, PROTIME in the last 168 hours. Cardiac Enzymes: No results for input(s): CKTOTAL, CKMB, CKMBINDEX, TROPONINI in the last 168 hours. BNP (last 3 results) Recent Labs    11/13/23 0950 12/04/23 1428 04/28/24 1548  PROBNP 3,383* 4,891* 2,352*   HbA1C: No results for input(s): HGBA1C in the last 72 hours. CBG: Recent Labs  Lab 05/03/24 0740 05/03/24 1828 05/04/24 0752 05/05/24 0742 05/05/24 1152  GLUCAP 135* 392* 114* 172* 198*   Lipid Profile: No results for input(s): CHOL, HDL, LDLCALC, TRIG, CHOLHDL, LDLDIRECT in the last 72  hours. Thyroid  Function Tests: No results for input(s): TSH, T4TOTAL, FREET4, T3FREE, THYROIDAB in the last 72 hours. Anemia Panel: No results for input(s): VITAMINB12, FOLATE, FERRITIN, TIBC, IRON, RETICCTPCT in the last 72 hours. Sepsis Labs: Recent Labs  Lab 04/30/24 2342 05/02/24 1054  PROCALCITON 0.13  --   LATICACIDVEN  --  1.4    Recent Results (from the past 240 hours)  Resp panel by RT-PCR (RSV, Flu A&B, Covid) Anterior Nasal Swab     Status: None   Collection Time: 04/30/24 11:06 AM   Specimen: Anterior Nasal Swab  Result Value Ref Range Status   SARS Coronavirus 2 by RT PCR NEGATIVE NEGATIVE Final   Influenza A by PCR NEGATIVE NEGATIVE Final   Influenza B by PCR NEGATIVE NEGATIVE Final    Comment: (NOTE) The Xpert Xpress SARS-CoV-2/FLU/RSV plus assay is intended as an aid  in the diagnosis of influenza from Nasopharyngeal swab specimens and should not be used as a sole basis for treatment. Nasal washings and aspirates are unacceptable for Xpert Xpress SARS-CoV-2/FLU/RSV testing.  Fact Sheet for Patients: bloggercourse.com  Fact Sheet for Healthcare Providers: seriousbroker.it  This test is not yet approved or cleared by the [place]  FDA and has been authorized for detection and/or diagnosis of SARS-CoV-2 by FDA under an Emergency Use Authorization (EUA). This EUA will remain in effect (meaning this test can be used) for the duration of the COVID-19 declaration under Section 564(b)(1) of the Act, 21 U.S.C. section 360bbb-3(b)(1), unless the authorization is terminated or revoked.     Resp Syncytial Virus by PCR NEGATIVE NEGATIVE Final    Comment: (NOTE) Fact Sheet for Patients: bloggercourse.com  Fact Sheet for Healthcare Providers: seriousbroker.it  This test is not yet approved or cleared by the [place]  FDA and has been  authorized for detection and/or diagnosis of SARS-CoV-2 by FDA under an Emergency Use Authorization (EUA). This EUA will remain in effect (meaning this test can be used) for the duration of the COVID-19 declaration under Section 564(b)(1) of the Act, 21 U.S.C. section 360bbb-3(b)(1), unless the authorization is terminated or revoked.  Performed at [facility], [address] [place], [place] [address]   Culture, body fluid w Gram Stain-bottle     Status: None   Collection Time: 04/30/24  3:54 PM   Specimen: Pleura  Result Value Ref Range Status   Specimen Description PLEURAL  Final   Special Requests RIGHT LUNG  Final   Culture   Final    NO GROWTH 5 DAYS Performed at [facility], [address] [place], [place] [address]    Report Status 05/05/2024 FINAL  Final  Gram stain     Status: None   Collection Time: 04/30/24  3:54 PM   Specimen: Pleura  Result Value Ref Range Status   Specimen Description PLEURAL  Final   Special Requests RIGHT LUNG  Final   Gram Stain   Final    WBC PRESENT,BOTH PMN AND MONONUCLEAR NO ORGANISMS SEEN CYTOSPIN SMEAR Performed at [facility], [address], [place], [place] [address]    Report Status 04/30/2024 FINAL  Final  Body fluid culture w Gram Stain     Status: None (Preliminary result)   Collection Time: 05/03/24  9:46 AM   Specimen: Lung, Right; Pleural Fluid  Result Value Ref Range Status   Specimen Description LUNG  Final   Special Requests NONE  Final   Gram Stain   Final    RARE WBC PRESENT,BOTH PMN AND MONONUCLEAR NO ORGANISMS SEEN    Culture   Final    NO GROWTH 2 DAYS Performed at [facility], [address], [place], [place] [address]    Report Status PENDING  Incomplete         Radiology Studies: DG Swallowing Func-Speech Pathology Result Date: 05/04/2024 Table formatting from the original result was not included. Modified Barium Swallow Study Patient Details Name: Hailey [name] MRN:  [id number] Date of Birth: 1945/08/28 Today's Date: 05/04/2024 HPI/PMH: HPI: Patient is a 78 y.o. female who presented to [facility] on 04/30/24 from home with SOB. She was admitted with progressive dyspnea and pleural effusion, suspected to be acute on chronic diastolic CHF exacerbation with pulmonary hypertension. Cardiology consulted. CXR on 11/28 showed new large right pleural effusion, with right lung compressive atelectasis but left lung clear. She  underwent right thoracentesis 11/28 and 12/1 yielding 1.1 liters of pleural fluid and repeat CXR showed decreased right pleural effusion.12/1 RHC. SLP swallow evaluation ordered on 11/29 due to patient's c/o difficulty swallowing pills and some solid foods. MBS ordered to rule out aspiration. PMH: CAD, a-fib, HFpEF, HTN, DM-2, hyperthyroidism s/p right hemithyroidectomy 09/20/2022, 2023 barium esophagram indicating Mild esophageal deviation to the right and mild circumferential narrowing of the esophageal lumen in the lower neck by the patient's multinodular goiter, OSA, hearing loss. Clinical Impression: Clinical Impression: Patient presents with an oropharyngeal swallow that is within functional limits per this MBS. No aspiration or penetration was observed across any of the tested consistencies. SLP recommending patient continue regular/thin diet as tolerated. No SLP follow up is needed. SLP signing off at this time. DIGEST Swallow Severity Rating*               Safety: 0               Efficiency: 0               Overall Pharyngeal Swallow Severity: 0 1: mild; 2: moderate; 3: severe; 4: profound *The Dynamic Imaging Grade of Swallowing Toxicity is standardized for the head and neck cancer population, however, demonstrates promising clinical applications across populations to standardize the clinical rating of pharyngeal swallow safety and severity. Recommendations/Plan: Swallowing Evaluation Recommendations Swallowing Evaluation Recommendations Recommendations: PO diet PO  Diet Recommendation: Regular; Thin liquids (Level 0) Liquid Administration via: Spoon; Cup; Straw Medication Administration: Other (Comment) (As tolerated) Supervision: Patient able to self-feed Swallowing strategies  : Slow rate; Small bites/sips Postural changes: Position pt fully upright for meals; Stay upright 30-60 min after meals Oral care recommendations: Oral care BID (2x/day) Treatment Plan Treatment Plan Treatment recommendations: No treatment recommended at this time Follow-up recommendations: No SLP follow up Recommendations Recommendations for follow up therapy are one component of a multi-disciplinary discharge planning process, led by the attending physician.  Recommendations may be updated based on patient status, additional functional criteria and insurance authorization. Assessment: Orofacial Exam: Orofacial Exam Oral Cavity: Oral Hygiene: WFL Oral Cavity - Dentition: Adequate natural dentition Orofacial Anatomy: WFL Oral Motor/Sensory Function: WFL Anatomy: Anatomy: WFL Boluses Administered: Boluses Administered Boluses Administered: Thin liquids (Level 0); Mildly thick liquids (Level 2, nectar thick); Moderately thick liquids (Level 3, honey thick); Puree; Solid  Oral Impairment Domain: Oral Impairment Domain Lip Closure: No labial escape Tongue control during bolus hold: Not tested Bolus preparation/mastication: Timely and efficient chewing and mashing Bolus transport/lingual motion: Brisk tongue motion Oral residue: Complete oral clearance Location of oral residue : N/A Initiation of pharyngeal swallow : Posterior angle of the ramus  Pharyngeal Impairment Domain: Pharyngeal Impairment Domain Soft palate elevation: No bolus between soft palate (SP)/pharyngeal wall (PW) Laryngeal elevation: Complete superior movement of thyroid  cartilage with complete approximation of arytenoids to epiglottic petiole Anterior hyoid excursion: Complete anterior movement Epiglottic movement: Complete inversion  Laryngeal vestibule closure: Complete, no air/contrast in laryngeal vestibule Pharyngeal stripping wave : Present - complete Pharyngeal contraction (A/P view only): N/A Pharyngoesophageal segment opening: Complete distension and complete duration, no obstruction of flow Tongue base retraction: No contrast between tongue base and posterior pharyngeal wall (PPW) Pharyngeal residue: Complete pharyngeal clearance Location of pharyngeal residue: N/A  Esophageal Impairment Domain: Esophageal Impairment Domain Esophageal clearance upright position: Complete clearance, esophageal coating Pill: No data recorded Penetration/Aspiration Scale Score: Penetration/Aspiration Scale Score 1.  Material does not enter airway: Thin liquids (Level 0); Mildly thick liquids (Level 2,  nectar thick); Moderately thick liquids (Level 3, honey thick); Puree; Solid Compensatory Strategies: Compensatory Strategies Compensatory strategies: No   General Information: Caregiver present: No  Diet Prior to this Study: Regular; Thin liquids (Level 0)   Temperature : Normal   Respiratory Status: WFL   Supplemental O2: None (Room air)   History of Recent Intubation: No  Behavior/Cognition: Alert; Cooperative; Pleasant mood Self-Feeding Abilities: Able to self-feed Baseline vocal quality/speech: Normal No data recorded Volitional Swallow: Able to elicit Exam Limitations: No limitations Goal Planning: No data recorded No data recorded No data recorded No data recorded No data recorded Pain: Pain Assessment Pain Assessment: Faces Pain Score: 3 Faces Pain Scale: 2 Pain Location: back Pain Descriptors / Indicators: Aching Pain Intervention(s): Limited activity within patient's tolerance; Premedicated before session; Repositioned; Monitored during session End of Session: Start Time:SLP Start Time (ACUTE ONLY): 1049 Stop Time: SLP Stop Time (ACUTE ONLY): 1103 Time Calculation:SLP Time Calculation (min) (ACUTE ONLY): 14 min Charges: SLP Evaluations $ SLP Speech  Visit: 1 Visit SLP Evaluations $MBS Swallow: 1 Procedure SLP visit diagnosis: SLP Visit Diagnosis: Dysphagia, unspecified (R13.10) Past Medical History: Past Medical History: Diagnosis Date  Acute on chronic diastolic CHF (congestive heart failure), NYHA class 1 (HCC) 04/29/2013  Allergic rhinitis   Brady-tachy syndrome (HCC)   s/p STJ dual chamber PPM by Dr Francyne  CAD (coronary artery disease)   70% RCA stenosis, treated medically  Control of atrial fibrillation with pacemaker (HCC)   Diabetes mellitus (HCC)   type 2  DJD (degenerative joint disease)   Dyspnea   occasional  Hearing loss   Right ear only - no hearing aids  Hypertension   Hyperthyroidism   Paroxysmal atrial fibrillation (HCC)   Pacemaker St Jude  Pneumonia   years ago- before 2012 per patient  [place]   as a child  PONV (postoperative nausea and vomiting)   Presence of permanent cardiac pacemaker   St Jude pacemaker  Pulmonary disease   Sleep apnea   uses  CPAP nightly Past Surgical History: Past Surgical History: Procedure Laterality Date  arm surgery d/t fx Right 1996  ATRIAL FIBRILLATION ABLATION  07/31/2012; 08-10-13  PVI by Dr Kelsie  ATRIAL FIBRILLATION ABLATION N/A 07/31/2012  Procedure: ATRIAL FIBRILLATION ABLATION;  Surgeon: Lynwood Kelsie, MD;  Location: [facility] CATH LAB;  Service: Cardiovascular;  Laterality: N/A;  ATRIAL FIBRILLATION ABLATION N/A 08/10/2013  Procedure: ATRIAL FIBRILLATION ABLATION;  Surgeon: Lynwood JONETTA Kelsie, MD;  Location: [facility] CATH LAB;  Service: Cardiovascular;  Laterality: N/A;  BACK SURGERY  07/26/2012  dR. nUDELMAN  BIOPSY  06/30/2023  Procedure: BIOPSY;  Surgeon: Cindie Carlin POUR, DO;  Location: [facility] ENDO SUITE;  Service: Endoscopy;;  CARDIAC CATHETERIZATION  12/03/2010  single vessel mid RCA  CARDIAC ELECTROPHYSIOLOGY MAPPING AND ABLATION  07/31/2012  Dr. Lynwood Kelsie  CESAREAN SECTION  09/20/1974  Dr. Lonni  CHOLECYSTECTOMY  1994  COLONOSCOPY WITH PROPOFOL  N/A 03/21/2015  Procedure: COLONOSCOPY WITH PROPOFOL ;  Surgeon: Renaye Sous, MD;  Location: [facility] ENDOSCOPY;  Service: Endoscopy;  Laterality: N/A;  COLONOSCOPY WITH PROPOFOL  N/A 06/30/2023  Procedure: COLONOSCOPY WITH PROPOFOL ;  Surgeon: Cindie Carlin POUR, DO;  Location: [facility];  Service: Endoscopy;  Laterality: N/A;  11:15 AM, ASA 4  DIAGNOSTIC MAMMOGRAM  04/16/2017  Duwaine HERO. Morris, DO  ESOPHAGOGASTRODUODENOSCOPY (EGD) WITH PROPOFOL  N/A 06/30/2023  Procedure: ESOPHAGOGASTRODUODENOSCOPY (EGD) WITH PROPOFOL ;  Surgeon: Cindie Carlin POUR, DO;  Location: [facility];  Service: Endoscopy;  Laterality: N/A;  11:15 AM, ASA 4  EYE  SURGERY Bilateral   remove cataracts  IR THORACENTESIS ASP PLEURAL SPACE W/IMG GUIDE  04/30/2024  IR THORACENTESIS ASP PLEURAL SPACE W/IMG GUIDE  05/03/2024  LAPAROSCOPIC HYSTERECTOMY  Late 70s to early 80s  LEFT HEART CATH AND CORONARY ANGIOGRAPHY N/A 07/30/2017  Procedure: LEFT HEART CATH AND CORONARY ANGIOGRAPHY;  Surgeon: Burnard Debby LABOR, MD;  Location: [facility] INVASIVE CV LAB;  Service: Cardiovascular;  Laterality: N/A;  LUMBAR LAMINECTOMY/DECOMPRESSION MICRODISCECTOMY Right 12/23/2012  Procedure: Right L5-S1 Laminotomy for resection of synovial cyst;  Surgeon: Lamar LELON Peaches, MD;  Location: [facility] NEURO ORS;  Service: Neurosurgery;  Laterality: Right;  Right Lumbar five-sacral one Laminotomy for resection of synovial cyst  NM MYOCAR PERF WALL MOTION  12/20/2010  normal  PACEMAKER IMPLANT N/A 08/09/2019  Procedure: PACEMAKER IMPLANT;  Surgeon: Francyne Headland, MD;  Location: [facility] INVASIVE CV LAB;  Service: Cardiovascular;  Laterality: N/A;  PACEMAKER INSERTION  12/03/2010  SJM Accent DR RF implanted by Dr Francyne  POLYPECTOMY  06/30/2023  Procedure: POLYPECTOMY;  Surgeon: Cindie Carlin POUR, DO;  Location: [facility];  Service: Endoscopy;;  RIGHT HEART CATH N/A 04/22/2018  Procedure: RIGHT HEART CATH;  Surgeon: Darron Deatrice LABOR, MD;  Location: [facility] INVASIVE CV LAB;  Service: Cardiovascular;  Laterality: N/A;  RIGHT HEART CATH N/A 07/09/2022  Procedure: RIGHT HEART  CATH;  Surgeon: Cherrie Toribio SAUNDERS, MD;  Location: [facility] INVASIVE CV LAB;  Service: Cardiovascular;  Laterality: N/A;  RIGHT HEART CATH N/A 05/03/2024  Procedure: RIGHT HEART CATH;  Surgeon: Cherrie Toribio SAUNDERS, MD;  Location: [facility] INVASIVE CV LAB;  Service: Cardiovascular;  Laterality: N/A;  TEE WITHOUT CARDIOVERSION N/A 07/30/2012  Procedure: TRANSESOPHAGEAL ECHOCARDIOGRAM (TEE);  Surgeon: Headland Francyne, MD;  Location: [facility] ENDOSCOPY;  Service: Cardiovascular;  Laterality: N/A;  h&p in file-hope  TEE WITHOUT CARDIOVERSION N/A 08/09/2013  Procedure: TRANSESOPHAGEAL ECHOCARDIOGRAM (TEE);  Surgeon: Redell GORMAN Shallow, MD;  Location: [facility] ENDOSCOPY;  Service: Cardiovascular;  Laterality: N/A;  THYROID  LOBECTOMY Right 09/20/2022  Procedure: RIGHT HEMITHYROIDECTOMY;  Surgeon: Llewellyn Gerard LABOR, DO;  Location: [facility] OR;  Service: ENT;  Laterality: Right;  TOTAL ABDOMINAL HYSTERECTOMY  1981  ULTRASOUND GUIDANCE FOR VASCULAR ACCESS  04/22/2018  Procedure: Ultrasound Guidance For Vascular Access;  Surgeon: Darron Deatrice LABOR, MD;  Location: [facility] INVASIVE CV LAB;  Service: Cardiovascular;; Norleen IVAR Blase, MA, CCC-SLP Speech Therapy 05/04/2024, 3:12 PM       Scheduled Meds:  acetaminophen   1,000 mg Oral QID   apixaban   5 mg Oral BID   Chlorhexidine  Gluconate Cloth  6 each Topical Daily   feeding supplement  237 mL Oral TID BM   methimazole   20 mg Oral Daily   pantoprazole   40 mg Oral Daily   polyethylene glycol  17 g Oral Daily   predniSONE   20 mg Oral Q breakfast   senna-docusate  2 tablet Oral BID   sildenafil   40 mg Oral TID   tamsulosin  0.4 mg Oral Daily   tiZANidine  2 mg Oral QHS   Continuous Infusions:   LOS: 5 days       Renato Applebaum, MD [facility]

## 2024-05-05 NOTE — Progress Notes (Signed)
 Mobility Specialist Progress Note:    05/05/24 1100  Mobility  Activity Ambulated with assistance  Level of Assistance Contact guard assist, steadying assist  Assistive Device Other (Comment) (HHA)  Distance Ambulated (ft) 200 ft  Activity Response Tolerated well  Mobility Referral Yes  Mobility visit 1 Mobility  Mobility Specialist Start Time (ACUTE ONLY) 1000  Mobility Specialist Stop Time (ACUTE ONLY) 1011  Mobility Specialist Time Calculation (min) (ACUTE ONLY) 11 min   Pt received in chair agreeable to mobility. C/o stomach pain otherwise tolerated well. Required contact guard w/ HHA. Returned back to room w/o fault. Left in chair w/ call bell and personal belongings in reach. All needs met.   Thersia Minder Mobility Specialist  Please contact vis Secure Chat or  Rehab Office [phone]

## 2024-05-05 NOTE — Progress Notes (Signed)
  Progress Note  Patient Name: Hailey [name] Date of Encounter: 05/05/2024 [facility] Cardiologist: Jerel Balding, MD   Interval Summary   Sitting up eating breakfast Doing well Main complaints are constipation, she has been receiving oxycodone  MiraLAX  and suppository already ordered  Follow up with cardiology has been arranged   Vital Signs Vitals:   05/04/24 2012 05/04/24 2331 05/05/24 0419 05/05/24 0717  BP:  (!) 135/52 (!) 96/56 (!) 92/45  Pulse:    62  Resp: 16  17 17   Temp: 97.7 F (36.5 C)  98.2 F (36.8 C) 98 F (36.7 C)  TempSrc: Oral  Oral Axillary  SpO2: 96%   96%  Weight:      Height:        Intake/Output Summary (Last 24 hours) at 05/05/2024 0939 Last data filed at 05/05/2024 0422 Gross per 24 hour  Intake 360 ml  Output 700 ml  Net -340 ml      04/30/2024   10:48 AM 04/23/2024    1:10 PM 04/14/2024    3:35 PM  Last 3 Weights  Weight (lbs) 103 lb 103 lb 12.8 oz 105 lb  Weight (kg) 46.72 kg 47.083 kg 47.628 kg      Telemetry/ECG  Atrial fib/flutter, rates 80s - Personally Reviewed  Physical Exam  GEN: No acute distress.   Neck: No JVD Cardiac: iRRR, no murmurs, rubs, or gallops.  Respiratory: Clear to auscultation bilaterally. GI: Soft, nontender, non-distended  MS: No edema  Assessment & Plan   Pulmonary Hypertension Bilateral Pleural Effusions, R  > L Home meds: spironolactone  25 mg daily, Lasix  80 mg BID with 20 mEq K TID, Lopressor  12.5 mg BID Recent echo: LVEF 60-65%, severely enlarged RV with normal RV function and severely elevated PASP, mild MR, and severe TR. RVSP had increased from 55.6 mmHg to 81.6 mmHg  Outpatient proBNP 2,352, BNP 151 Presented with worsening SOB Underwent thoracentesis 11/28 with 1.1 L out from right Repeat thoracentesis today 12/1 with 900 cc out from right  Renal function stable today RHC 05/03/2024: mild PAH, normal left-sided filling pressures and cardiac output. PA pressures and PCWP improved  from prior cath. No change in PVR with addition of low dose PDE-5i Continue sildenafil  40 mg TID Follow up with Dr. Bensimhon in [facility] HTN clinic to discuss trial of macitentan vs sotatercept scheduled 06/28/2024 SLE labs drawn, pending results, may need to follow as an outpatient    Nonobstructive CAD LHC in 2019 showed non-obstructive disease  EKG shows no acute ischemic changes  Not on ASA due to Eliquis  use Not on statin due to intolerance   Permanent A-Fib s/p ablation 2014, 2015 Home meds: Eliquis  5 mg BID, Lopressor  12.5 mg BID Continue Eliquis  5 mg BID Not on any AV nodal blockers at the moment, rates controlled   Tachy-brady syndrome s/p PPM Last device check in 02/2024 showed RV was paced 93% of the time  Continue to follow with Dr. Balding as outpatient   Per primary Hyperthyroidism Diabetes Electrolyte disturbances Urinary retention Pleural effusions Abdominal pain    For questions or updates, please contact [facility] Please consult [email or web address] for contact info under       Signed, Waddell DELENA Donath, PA-C

## 2024-05-06 ENCOUNTER — Telehealth (HOSPITAL_COMMUNITY): Payer: Self-pay

## 2024-05-06 ENCOUNTER — Other Ambulatory Visit (HOSPITAL_COMMUNITY): Payer: Self-pay

## 2024-05-06 DIAGNOSIS — I50813 Acute on chronic right heart failure: Secondary | ICD-10-CM

## 2024-05-06 DIAGNOSIS — I4821 Permanent atrial fibrillation: Secondary | ICD-10-CM | POA: Diagnosis not present

## 2024-05-06 DIAGNOSIS — J90 Pleural effusion, not elsewhere classified: Secondary | ICD-10-CM | POA: Diagnosis not present

## 2024-05-06 LAB — BASIC METABOLIC PANEL WITH GFR
Anion gap: 9 (ref 5–15)
BUN: 23 mg/dL (ref 8–23)
CO2: 29 mmol/L (ref 22–32)
Calcium: 9.4 mg/dL (ref 8.9–10.3)
Chloride: 90 mmol/L — ABNORMAL LOW (ref 98–111)
Creatinine, Ser: 0.82 mg/dL (ref 0.44–1.00)
GFR, Estimated: >60 mL/min (ref >60)
Glucose, Bld: 148 mg/dL — ABNORMAL HIGH (ref 70–99)
Potassium: 4.4 mmol/L (ref 3.5–5.1)
Sodium: 128 mmol/L — ABNORMAL LOW (ref 135–145)

## 2024-05-06 LAB — BODY FLUID CULTURE W GRAM STAIN: Culture: NO GROWTH

## 2024-05-06 LAB — CBC
HCT: 37.3 % (ref 36.0–46.0)
Hemoglobin: 11.9 g/dL — ABNORMAL LOW (ref 12.0–15.0)
MCH: 25.8 pg — ABNORMAL LOW (ref 26.0–34.0)
MCHC: 31.9 g/dL (ref 30.0–36.0)
MCV: 80.9 fL (ref 80.0–100.0)
Platelets: 352 K/uL (ref 150–400)
RBC: 4.61 MIL/uL (ref 3.87–5.11)
RDW: 15.4 % (ref 11.5–15.5)
WBC: 11.9 K/uL — ABNORMAL HIGH (ref 4.0–10.5)
nRBC: 0 % (ref 0.0–0.2)

## 2024-05-06 LAB — HEXAGONAL PHASE PHOSPHOLIPID: Hexagonal Phase Phospholipid: 11 s (ref 0–11)

## 2024-05-06 LAB — DRVVT MIX: dRVVT Mix: 65.5 s — ABNORMAL HIGH (ref 0.0–40.4)

## 2024-05-06 LAB — BETA-2-GLYCOPROTEIN I ABS, IGG/M/A
Beta-2 Glyco I IgG: <9 GPI IgG units (ref 0–20)
Beta-2-Glycoprotein I IgA: <9 GPI IgA units (ref 0–25)
Beta-2-Glycoprotein I IgM: <9 GPI IgM units (ref 0–32)

## 2024-05-06 LAB — EXTRACTABLE NUCLEAR ANTIGEN ANTIBODY
ENA SM Ab Ser-aCnc: <0.2 AI (ref 0.0–0.9)
Ribonucleic Protein: <0.2 AI (ref 0.0–0.9)
SSA (Ro) (ENA) Antibody, IgG: 0.4 AI (ref 0.0–0.9)
SSB (La) (ENA) Antibody, IgG: <0.2 AI (ref 0.0–0.9)
Scleroderma (Scl-70) (ENA) Antibody, IgG: <0.2 AI (ref 0.0–0.9)
ds DNA Ab: 1 [IU]/mL (ref 0–9)

## 2024-05-06 LAB — LUPUS ANTICOAGULANT PANEL
DRVVT: 112.2 s — ABNORMAL HIGH (ref 0.0–47.0)
PTT Lupus Anticoagulant: 54.4 s — ABNORMAL HIGH (ref 0.0–43.5)

## 2024-05-06 LAB — GLUCOSE, CAPILLARY: Glucose-Capillary: 120 mg/dL — ABNORMAL HIGH (ref 70–99)

## 2024-05-06 LAB — DRVVT CONFIRM: dRVVT Confirm: 1.1 ratio (ref 0.8–1.2)

## 2024-05-06 LAB — PTT-LA MIX: PTT-LA Mix: 49.2 s — ABNORMAL HIGH (ref 0.0–40.5)

## 2024-05-06 LAB — ANTI-JO 1 ANTIBODY, IGG: Anti JO-1: <0.2 AI (ref 0.0–0.9)

## 2024-05-06 MED ORDER — FUROSEMIDE 40 MG PO TABS
40.0000 mg | ORAL_TABLET | Freq: Two times a day (BID) | ORAL | Status: DC
  Administered 2024-05-06: 40 mg via ORAL
  Filled 2024-05-06: qty 1

## 2024-05-06 MED ORDER — LACTULOSE 10 GM/15ML PO SOLN
30.0000 g | Freq: Once | ORAL | Status: AC
  Administered 2024-05-06: 30 g via ORAL
  Filled 2024-05-06: qty 45

## 2024-05-06 MED ORDER — SILDENAFIL CITRATE 20 MG PO TABS
40.0000 mg | ORAL_TABLET | Freq: Three times a day (TID) | ORAL | 2 refills | Status: AC
  Filled 2024-05-06: qty 180, 30d supply, fill #0

## 2024-05-06 MED ORDER — PREDNISONE 10 MG PO TABS
ORAL_TABLET | ORAL | 0 refills | Status: DC
  Filled 2024-05-06: qty 9, 6d supply, fill #0

## 2024-05-06 MED ORDER — FUROSEMIDE 40 MG PO TABS: 40.0000 mg | ORAL_TABLET | Freq: Two times a day (BID) | ORAL | Status: DC

## 2024-05-06 MED ORDER — POLYETHYLENE GLYCOL 3350 17 GM/SCOOP PO POWD
17.0000 g | Freq: Two times a day (BID) | ORAL | 0 refills | Status: DC
  Filled 2024-05-06: qty 238, 7d supply, fill #0

## 2024-05-06 MED ORDER — SENNOSIDES-DOCUSATE SODIUM 8.6-50 MG PO TABS
2.0000 | ORAL_TABLET | Freq: Two times a day (BID) | ORAL | 0 refills | Status: DC
  Filled 2024-05-06: qty 120, 30d supply, fill #0

## 2024-05-06 MED ORDER — BISACODYL 10 MG RE SUPP
10.0000 mg | Freq: Once | RECTAL | Status: AC
  Administered 2024-05-06: 10 mg via RECTAL
  Filled 2024-05-06: qty 1

## 2024-05-06 MED ORDER — TAMSULOSIN HCL 0.4 MG PO CAPS
0.4000 mg | ORAL_CAPSULE | Freq: Every day | ORAL | 0 refills | Status: AC
  Filled 2024-05-06: qty 30, 30d supply, fill #0

## 2024-05-06 NOTE — Plan of Care (Signed)

## 2024-05-06 NOTE — TOC Transition Note (Signed)
 Transition of Care [facility]) - Discharge Note   Patient Details  Name: Hailey [name] MRN: [id number] Date of Birth: 1945-06-08  Transition [facility]) CM/SW Contact:  Sudie Erminio Deems, RN Phone Number: 05/06/2024, 11:13 AM   Clinical Narrative:  Patient will discharge home today. ICM spoke with daughter [name] regarding PT/OT recommendations for home health. Daughter is agreeable and patient will be serviced via [employer]. Daughter states a caregiver is in the home for her dad and the caregiver will be able to assist the patient as well. No DME needs identified at this time. Daughter Olam to provide transportation home. No further needs identified at this time.   Final next level of care: Home w Home Health Services Barriers to Discharge: No Barriers Identified   Patient Goals and CMS Choice Patient states their goals for this hospitalization and ongoing recovery are:: Plans to return home once stable.   Choice offered to / list presented to : Adult Children, Patient  Discharge Plan and Services Additional resources added to the After Visit Summary for   In-house Referral: NA Discharge Planning Services: CM Consult Post Acute Care Choice: Home Health            DME Agency: NA       HH Arranged: PT, OT HH Agency: [facility] Date [facility] Agency Contacted: 05/04/24 Time HH Agency Contacted: [phone] Representative spoke with at [facility] [place] Agency: [facility]  Social Drivers of Health (SDOH) Interventions SDOH Screenings   Food Insecurity: No Food Insecurity (04/30/2024)  Housing: Low Risk  (04/30/2024)  Transportation Needs: No Transportation Needs (04/30/2024)  Utilities: Not At Risk (04/30/2024)  Depression (PHQ2-9): Low Risk  (06/18/2023)  Financial Resource Strain: Low Risk (11/03/2023)   Received from [facility]  Social Connections: Socially Integrated (04/30/2024)  Tobacco Use: Low Risk  (04/30/2024)     Readmission Risk Interventions     No data to  display

## 2024-05-06 NOTE — Telephone Encounter (Signed)
 Pharmacy Patient Advocate Encounter  Received notification from [facility] that Prior Authorization for Sildenafil  20mg  tablet has been APPROVED from 05/06/24 to 05/06/25   PA #/Case ID/Reference #: [id number]

## 2024-05-06 NOTE — Progress Notes (Signed)
 Physical Therapy Treatment Patient Details Name: Hailey [name] MRN: [id number] DOB: 09-21-1945 Today's Date: 05/06/2024   History of Present Illness 78 yo F  adm 04/30/2024 with SOB, bil pleural effusions, acute on chronic CHF, mixed pulmonary HTN, acute respiratory failure. 11/28 & 12/1 thoracentesis. 12/1 RHC. PMHx: CAD, Afib, tachy-brady syndrome s/p PPM, HFpEF, HTN, DM2, hyperthyroidism, OSA.    PT Comments  Pt demonstrating good progress and was able to progress distance ambulated this session with no reports of SOB. Pt ambulated without an AD and no physical assistance given, but does require verbal cueing for looking up while mobilizing. Pt would benefit from further gait training and LE strengthening. PT will continue to treat pt while she is admitted. Recommending HHPT at discharge to address remaining mobility deficits and optimize return to PLOF.     If plan is discharge home, recommend the following: A little help with walking and/or transfers;A little help with bathing/dressing/bathroom;Assistance with cooking/housework;Assist for transportation   Can travel by private vehicle        Equipment Recommendations  None recommended by PT    Recommendations for Other Services       Precautions / Restrictions Precautions Precautions: Fall Recall of Precautions/Restrictions: Intact Restrictions Weight Bearing Restrictions Per Provider Order: No     Mobility  Bed Mobility Overal bed mobility: Modified Independent             General bed mobility comments: pt received in chair and returned to chair at end of session    Transfers Overall transfer level: Needs assistance Equipment used: None Transfers: Sit to/from Stand Sit to Stand: Contact guard assist           General transfer comment: sit to stand from recliner using bilateral UE to push up from chair. Increased time to complete    Ambulation/Gait Ambulation/Gait assistance: Contact guard assist Gait  Distance (Feet): 200 Feet Assistive device: None Gait Pattern/deviations: Step-through pattern, Decreased stride length, Trunk flexed Gait velocity: reduced Gait velocity interpretation: <1.8 ft/sec, indicate of risk for recurrent falls   General Gait Details: Pt ambulates w/ reciprocal gait pattern, reduced step width, significant trunk flexion, with reduced hip and knee flexion throughout swing. Pt ambulates with increased cadence and reduced gait speed, and requires frequent VC to look up while ambulating as pt tends to look at her feet.   Stairs             Wheelchair Mobility     Tilt Bed    Modified Rankin (Stroke Patients Only)       Balance Overall balance assessment: Needs assistance Sitting-balance support: No upper extremity supported, Feet supported Sitting balance-Leahy Scale: Fair Sitting balance - Comments: seated in recliner   Standing balance support: No upper extremity supported, During functional activity Standing balance-Leahy Scale: Fair Standing balance comment: pt able to shift weight throughout gait with no losses of balance or signs of instability noted                            Communication Communication Communication: Impaired Factors Affecting Communication: Hearing impaired  Cognition Arousal: Alert Behavior During Therapy: WFL for tasks assessed/performed   PT - Cognitive impairments: Safety/Judgement                       PT - Cognition Comments: decreased awareness of current function Following commands: Intact      Cueing Cueing Techniques: Verbal cues  Exercises      General Comments General comments (skin integrity, edema, etc.): HR 84 bpm, SpO2 96% on RA      Pertinent Vitals/Pain Pain Assessment Pain Assessment: 0-10 Pain Score: 6  Faces Pain Scale: Hurts little more Pain Location: across her chest Pain Descriptors / Indicators: Discomfort, Constant Pain Intervention(s): Limited activity  within patient's tolerance, Monitored during session    Home Living                          Prior Function            PT Goals (current goals can now be found in the care plan section) Acute Rehab PT Goals Patient Stated Goal: to go home to her husband PT Goal Formulation: With patient Time For Goal Achievement: 05/18/24 Potential to Achieve Goals: Good Progress towards PT goals: Progressing toward goals    Frequency    Min 2X/week      PT Plan      Co-evaluation              AM-PAC PT 6 Clicks Mobility   Outcome Measure  Help needed turning from your back to your side while in a flat bed without using bedrails?: A Little Help needed moving from lying on your back to sitting on the side of a flat bed without using bedrails?: A Little Help needed moving to and from a bed to a chair (including a wheelchair)?: A Little Help needed standing up from a chair using your arms (e.g., wheelchair or bedside chair)?: A Little Help needed to walk in hospital room?: A Little Help needed climbing 3-5 steps with a railing? : Total 6 Click Score: 16    End of Session Equipment Utilized During Treatment: Gait belt Activity Tolerance: Patient tolerated treatment well Patient left: in chair;with call bell/phone within reach Nurse Communication: Mobility status PT Visit Diagnosis: Other abnormalities of gait and mobility (R26.89);Difficulty in walking, not elsewhere classified (R26.2);Muscle weakness (generalized) (M62.81)     Time: 9089-9061 PT Time Calculation (min) (ACUTE ONLY): 28 min  Charges:    $Gait Training: 8-22 mins $Therapeutic Activity: 8-22 mins PT General Charges $$ ACUTE PT VISIT: 1 Visit                     Leontine Hilt DPT Acute Rehab Services [phone] Prefer contact via chat    Leontine B Jinnie Onley 05/06/2024, 11:04 AM

## 2024-05-06 NOTE — Discharge Summary (Signed)
 Physician Discharge Summary  Hailey [name] FMW:[id number] DOB: [birth year] DOA: 04/30/2024  PCP: [name], NP  Admit date: 04/30/2024 Discharge date: 05/06/2024  Admitted From: Home Disposition: Home with home health  Recommendations for Outpatient Follow-up:  Follow up with PCP in 1-2 weeks Please obtain BMP/CBC in one week Cardiology to schedule follow-up  Home Health: PT/OT/RN Equipment/Devices: Available at home  Discharge Condition: Fair CODE STATUS: Full code Diet recommendation: Low-salt diet, nutritional supplements  Discharge summary: 78 year old with history of coronary artery disease treated medically, A-fib tachybradycardia syndrome status post pacemaker, heart failure with preserved ejection fraction and known ejection fraction 60 to 65%, hypertension, type 2 diabetes, hyperthyroidism, sleep apnea, pulmonary hypertension presented to the hospital with shortness of breath, progressively worsening over the past week and also urinary retention. Underwent thoracentesis x 2 Underwent right heart cath.  Followed by cardiology.  Treated with diuresis.  Increased dose of sildenafil .  Tolerating.  Remains pretty frail but medically stable.  Going home today.   # Acute on chronic diastolic congestive heart failure, pulmonary hypertension and pulmonary venous hypertension: Acute hypoxic respiratory failure, now on room air. Presented with progressive shortness of breath, this was likely related to right-sided heart failure. Thoracentesis right lungs 11/28, 1.1 L removed Thoracentesis right lungs 12/1, 900 mL removed Treated with Lasix , resuming reduced dose of Lasix  40 mg twice daily. On sildenafil  at home, dose doubled to 40 mg 3 times daily. Cytology negative.  Cultures negative. Suspected inflammatory etiology due to exudative nature of the fluid. Started on steroids, she already have inflammatory markers positive.  Extensive investigations in 2019 with positive  ANA and lupus.   Will send her home with 1 week of tapering prednisone  therapy.   ANA positive, anti-Jo1 antibody, beta-2 glycoprotein, lupus anticoagulant, cardiolipin antibodies pending.  Will send referral to rheumatology for follow-up. Continue mobilizing with PT OT.  Use incentive spirometry.   She will focus on therapies, breathing exercises and mobility. Cardiology to schedule outpatient follow-up.   Acute urinary retention: Likely secondary to impaired mobility and constipation. Foley catheter removed 12/2.  Had adequate urine output without retention. Sending home on Flomax. Aggressive bowel regimen, MiraLAX  twice daily, Senokot twice daily.   Permanent A-fib with tachybradycardia syndrome status post pacemaker: V paced rhythm.  On Eliquis .  Rate controlled without medications.   Nonobstructive coronary artery disease: Already on Eliquis .   Hyperthyroidism: On methimazole  that is continued.  TSH 0.07.   Hyponatremia: Likely related to right-sided fluid overload.  Sodium remains stable at 127-128.   Frail debilitated.  Will benefit with working at home with therapies.  Declines to go to SNF.  High risk of readmission.  Currently stabilized to be discharged.  Discharge Diagnoses:  Principal Problem:   Acute on chronic heart failure [facility]) Active Problems:   Permanent atrial fibrillation (HCC)   Pleural effusion on right    Discharge Instructions  Discharge Instructions     Call MD for:  difficulty breathing, headache or visual disturbances   Complete by: As directed    Diet - low sodium heart healthy   Complete by: As directed    Discharge instructions   Complete by: As directed    Continue to work with therapies and also do breathing exercises at home   Increase activity slowly   Complete by: As directed       Allergies as of 05/06/2024       Reactions   Codeine Nausea And Vomiting, Nausea Only   Ketorolac  Nausea  And Vomiting, Nausea Only, Other (See  Comments)   pt told to never take this med   Meperidine  Nausea Only, Nausea And Vomiting   Multaq  [dronedarone ] Other (See Comments)   Increased fluid retention   Xarelto [rivaroxaban] Diarrhea   Flexeril  [cyclobenzaprine ] Nausea And Vomiting   Latex Rash   Pt stated on 09/20/2022        Medication List     STOP taking these medications    metoprolol  tartrate 25 MG tablet Commonly known as: LOPRESSOR    potassium chloride  SA 20 MEQ tablet Commonly known as: KLOR-CON  M   spironolactone  25 MG tablet Commonly known as: ALDACTONE        TAKE these medications    acetaminophen  500 MG tablet Commonly known as: TYLENOL  Take 500-1,000 mg by mouth every 6 (six) hours as needed (pain.).   albuterol  108 (90 Base) MCG/ACT inhaler Commonly known as: VENTOLIN  HFA Inhale 2 puffs into the lungs every 6 (six) hours as needed for wheezing or shortness of breath.   Eliquis  5 MG Tabs tablet Generic drug: apixaban  TAKE ONE TABLET BY MOUTH TWICE DAILY   fluticasone  50 MCG/ACT nasal spray Commonly known as: FLONASE  Place 2 sprays into both nostrils daily as needed for allergies or rhinitis.   furosemide  40 MG tablet Commonly known as: LASIX  Take 1 tablet (40 mg total) by mouth 2 (two) times daily. What changed: how much to take   loperamide  2 MG tablet Commonly known as: IMODIUM  A-D Take 2 mg by mouth. What changed:  when to take this reasons to take this   loratadine  10 MG tablet Commonly known as: Claritin  Take 1 tablet (10 mg total) by mouth daily.   methimazole  10 MG tablet Commonly known as: TAPAZOLE  Take 20 mg (two tablets) daily.   Nasal Moisturizing Spray 0.65 % nasal spray Generic drug: sodium chloride  Place 1 spray into the nose as needed for congestion.   ondansetron  4 MG tablet Commonly known as: ZOFRAN  TAKE ONE TABLET BY MOUTH EVERY EIGHT HOURS AS NEEDED FOR NAUSEA OR VOMITING   pantoprazole  40 MG tablet Commonly known as: PROTONIX  TAKE ONE TABLET BY  MOUTH TWICE DAILY BEFORE A MEAL   polyethylene glycol 17 g packet Commonly known as: MIRALAX  / GLYCOLAX  Take 17 g by mouth 2 (two) times daily.   predniSONE  10 MG tablet Commonly known as: DELTASONE  Take 2 tablets (20 mg total) by mouth daily with breakfast for 3 days, THEN 1 tablet (10 mg total) daily with breakfast for 3 days. Start taking on: May 07, 2024   senna-docusate 8.6-50 MG tablet Commonly known as: Senokot-S Take 2 tablets by mouth 2 (two) times daily.   sildenafil  20 MG tablet Commonly known as: REVATIO  Take 2 tablets (40 mg total) by mouth 3 (three) times daily. What changed: See the new instructions.   tamsulosin 0.4 MG Caps capsule Commonly known as: FLOMAX Take 1 capsule (0.4 mg total) by mouth daily. Start taking on: May 07, 2024   tizanidine 2 MG capsule Commonly known as: ZANAFLEX Take 2 mg by mouth at bedtime.   traMADol  50 MG tablet Commonly known as: ULTRAM  Take 50-100 mg by mouth every 8 (eight) hours as needed for moderate pain (pain score 4-6).   Vitamin D3 25 MCG (1000 UT) Caps Take 1,000 Units by mouth in the morning.        Follow-up Information     [facility] Follow up.   Why: Physical and Occupational Therapy-office to call with  visit times. Contact information: [address] [place] [address] [phone]                Allergies  Allergen Reactions   Codeine Nausea And Vomiting and Nausea Only   Ketorolac  Nausea And Vomiting, Nausea Only and Other (See Comments)    pt told to never take this med   Meperidine  Nausea Only and Nausea And Vomiting   Multaq  [Dronedarone ] Other (See Comments)    Increased fluid retention   Xarelto [Rivaroxaban] Diarrhea   Flexeril  [Cyclobenzaprine ] Nausea And Vomiting   Latex Rash    Pt stated on 09/20/2022    Consultations: Cardiology   Procedures/Studies: DG Swallowing Func-Speech Pathology Result Date: 05/04/2024 Table formatting from the  original result was not included. Modified Barium Swallow Study Patient Details Name: Hailey [name] MRN: [id number] Date of Birth: 01-14-1946 Today's Date: 05/04/2024 HPI/PMH: HPI: Patient is a 78 y.o. female who presented to [facility] on 04/30/24 from home with SOB. She was admitted with progressive dyspnea and pleural effusion, suspected to be acute on chronic diastolic CHF exacerbation with pulmonary hypertension. Cardiology consulted. CXR on 11/28 showed new large right pleural effusion, with right lung compressive atelectasis but left lung clear. She underwent right thoracentesis 11/28 and 12/1 yielding 1.1 liters of pleural fluid and repeat CXR showed decreased right pleural effusion.12/1 RHC. SLP swallow evaluation ordered on 11/29 due to patient's c/o difficulty swallowing pills and some solid foods. MBS ordered to rule out aspiration. PMH: CAD, a-fib, HFpEF, HTN, DM-2, hyperthyroidism s/p right hemithyroidectomy 09/20/2022, 2023 barium esophagram indicating Mild esophageal deviation to the right and mild circumferential narrowing of the esophageal lumen in the lower neck by the patient's multinodular goiter, OSA, hearing loss. Clinical Impression: Clinical Impression: Patient presents with an oropharyngeal swallow that is within functional limits per this MBS. No aspiration or penetration was observed across any of the tested consistencies. SLP recommending patient continue regular/thin diet as tolerated. No SLP follow up is needed. SLP signing off at this time. DIGEST Swallow Severity Rating*               Safety: 0               Efficiency: 0               Overall Pharyngeal Swallow Severity: 0 1: mild; 2: moderate; 3: severe; 4: profound *The Dynamic Imaging Grade of Swallowing Toxicity is standardized for the head and neck cancer population, however, demonstrates promising clinical applications across populations to standardize the clinical rating of pharyngeal swallow safety and severity. Recommendations/Plan:  Swallowing Evaluation Recommendations Swallowing Evaluation Recommendations Recommendations: PO diet PO Diet Recommendation: Regular; Thin liquids (Level 0) Liquid Administration via: Spoon; Cup; Straw Medication Administration: Other (Comment) (As tolerated) Supervision: Patient able to self-feed Swallowing strategies  : Slow rate; Small bites/sips Postural changes: Position pt fully upright for meals; Stay upright 30-60 min after meals Oral care recommendations: Oral care BID (2x/day) Treatment Plan Treatment Plan Treatment recommendations: No treatment recommended at this time Follow-up recommendations: No SLP follow up Recommendations Recommendations for follow up therapy are one component of a multi-disciplinary discharge planning process, led by the attending physician.  Recommendations may be updated based on patient status, additional functional criteria and insurance authorization. Assessment: Orofacial Exam: Orofacial Exam Oral Cavity: Oral Hygiene: WFL Oral Cavity - Dentition: Adequate natural dentition Orofacial Anatomy: WFL Oral Motor/Sensory Function: WFL Anatomy: Anatomy: WFL Boluses Administered: Boluses Administered Boluses Administered: Thin liquids (Level 0); Mildly thick  liquids (Level 2, nectar thick); Moderately thick liquids (Level 3, honey thick); Puree; Solid  Oral Impairment Domain: Oral Impairment Domain Lip Closure: No labial escape Tongue control during bolus hold: Not tested Bolus preparation/mastication: Timely and efficient chewing and mashing Bolus transport/lingual motion: Brisk tongue motion Oral residue: Complete oral clearance Location of oral residue : N/A Initiation of pharyngeal swallow : Posterior angle of the ramus  Pharyngeal Impairment Domain: Pharyngeal Impairment Domain Soft palate elevation: No bolus between soft palate (SP)/pharyngeal wall (PW) Laryngeal elevation: Complete superior movement of thyroid  cartilage with complete approximation of arytenoids to epiglottic  petiole Anterior hyoid excursion: Complete anterior movement Epiglottic movement: Complete inversion Laryngeal vestibule closure: Complete, no air/contrast in laryngeal vestibule Pharyngeal stripping wave : Present - complete Pharyngeal contraction (A/P view only): N/A Pharyngoesophageal segment opening: Complete distension and complete duration, no obstruction of flow Tongue base retraction: No contrast between tongue base and posterior pharyngeal wall (PPW) Pharyngeal residue: Complete pharyngeal clearance Location of pharyngeal residue: N/A  Esophageal Impairment Domain: Esophageal Impairment Domain Esophageal clearance upright position: Complete clearance, esophageal coating Pill: No data recorded Penetration/Aspiration Scale Score: Penetration/Aspiration Scale Score 1.  Material does not enter airway: Thin liquids (Level 0); Mildly thick liquids (Level 2, nectar thick); Moderately thick liquids (Level 3, honey thick); Puree; Solid Compensatory Strategies: Compensatory Strategies Compensatory strategies: No   General Information: Caregiver present: No  Diet Prior to this Study: Regular; Thin liquids (Level 0)   Temperature : Normal   Respiratory Status: WFL   Supplemental O2: None (Room air)   History of Recent Intubation: No  Behavior/Cognition: Alert; Cooperative; Pleasant mood Self-Feeding Abilities: Able to self-feed Baseline vocal quality/speech: Normal No data recorded Volitional Swallow: Able to elicit Exam Limitations: No limitations Goal Planning: No data recorded No data recorded No data recorded No data recorded No data recorded Pain: Pain Assessment Pain Assessment: Faces Pain Score: 3 Faces Pain Scale: 2 Pain Location: back Pain Descriptors / Indicators: Aching Pain Intervention(s): Limited activity within patient's tolerance; Premedicated before session; Repositioned; Monitored during session End of Session: Start Time:SLP Start Time (ACUTE ONLY): 1049 Stop Time: SLP Stop Time (ACUTE ONLY): 1103  Time Calculation:SLP Time Calculation (min) (ACUTE ONLY): 14 min Charges: SLP Evaluations $ SLP Speech Visit: 1 Visit SLP Evaluations $MBS Swallow: 1 Procedure SLP visit diagnosis: SLP Visit Diagnosis: Dysphagia, unspecified (R13.10) Past Medical History: Past Medical History: Diagnosis Date  Acute on chronic diastolic CHF (congestive heart failure), NYHA class 1 (HCC) 04/29/2013  Allergic rhinitis   Brady-tachy syndrome (HCC)   s/p STJ dual chamber PPM by Dr Francyne  CAD (coronary artery disease)   70% RCA stenosis, treated medically  Control of atrial fibrillation with pacemaker (HCC)   Diabetes mellitus (HCC)   type 2  DJD (degenerative joint disease)   Dyspnea   occasional  Hearing loss   Right ear only - no hearing aids  Hypertension   Hyperthyroidism   Paroxysmal atrial fibrillation (HCC)   Pacemaker St Jude  Pneumonia   years ago- before 2012 per patient  Polio   as a child  PONV (postoperative nausea and vomiting)   Presence of permanent cardiac pacemaker   St Jude pacemaker  Pulmonary disease   Sleep apnea   uses  CPAP nightly Past Surgical History: Past Surgical History: Procedure Laterality Date  arm surgery d/t fx Right 1996  ATRIAL FIBRILLATION ABLATION  07/31/2012; 08-10-13  PVI by Dr Kelsie  ATRIAL FIBRILLATION ABLATION N/A 07/31/2012  Procedure: ATRIAL FIBRILLATION ABLATION;  Surgeon: Lynwood  Allred, MD;  Location: [facility] CATH LAB;  Service: Cardiovascular;  Laterality: N/A;  ATRIAL FIBRILLATION ABLATION N/A 08/10/2013  Procedure: ATRIAL FIBRILLATION ABLATION;  Surgeon: Lynwood JONETTA Rakers, MD;  Location: [facility] CATH LAB;  Service: Cardiovascular;  Laterality: N/A;  BACK SURGERY  07/26/2012  dR. nUDELMAN  BIOPSY  06/30/2023  Procedure: BIOPSY;  Surgeon: Cindie Carlin POUR, DO;  Location: [facility] ENDO SU[facility];  Service: Endoscopy;;  CARDIAC CATHETERIZATION  12/03/2010  single vessel mid RCA  CARDIAC ELECTROPHYSIOLOGY MAPPING AND ABLATION  07/31/2012  Dr. Lynwood Rakers  CESAREAN SECTION  09/20/1974  Dr. Lonni  CHOLECYSTECTOMY   1994  COLONOSCOPY WITH PROPOFOL  N/A 03/21/2015  Procedure: COLONOSCOPY WITH PROPOFOL ;  Surgeon: Renaye Sous, MD;  Location: [facility] ENDOSCOPY;  Service: Endoscopy;  Laterality: N/A;  COLONOSCOPY WITH PROPOFOL  N/A 06/30/2023  Procedure: COLONOSCOPY WITH PROPOFOL ;  Surgeon: Cindie Carlin POUR, DO;  Location: [facility] END[facility];  Service: Endoscopy;  Laterality: N/A;  11:15 AM, ASA 4  DIAGNOSTIC MAMMOGRAM  04/16/2017  Duwaine HERO. Morris, DO  ESOPHAGOGASTRODUODENOSCOPY (EGD) WITH PROPOFOL  N/A 06/30/2023  Procedure: ESOPHAGOGASTRODUODENOSCOPY (EGD) WITH PROPOFOL ;  Surgeon: Cindie Carlin POUR, DO;  Location: [facility];  Service: Endoscopy;  Laterality: N/A;  11:15 AM, ASA 4  EYE SURGERY Bilateral   remove cataracts  IR THORACENTESIS ASP PLEURAL SPACE W/IMG GUIDE  04/30/2024  IR THORACENTESIS ASP PLEURAL SPACE W/IMG GUIDE  05/03/2024  LAPAROSCOPIC HYSTERECTOMY  Late 70s to early 80s  LEFT HEART CATH AND CORONARY ANGIOGRAPHY N/A 07/30/2017  Procedure: LEFT HEART CATH AND CORONARY ANGIOGRAPHY;  Surgeon: Burnard Debby LABOR, MD;  Location: [facility] INVASIVE CV LAB;  Service: Cardiovascular;  Laterality: N/A;  LUMBAR LAMINECTOMY/DECOMPRESSION MICRODISCECTOMY Right 12/23/2012  Procedure: Right L5-S1 Laminotomy for resection of synovial cyst;  Surgeon: Lamar LELON Peaches, MD;  Location: [facility] NEURO ORS;  Service: Neurosurgery;  Laterality: Right;  Right Lumbar five-sacral one Laminotomy for resection of synovial cyst  NM MYOCAR PERF WALL MOTION  12/20/2010  normal  PACEMAKER IMPLANT N/A 08/09/2019  Procedure: PACEMAKER IMPLANT;  Surgeon: Francyne Headland, MD;  Location: [facility] INVASIVE CV LAB;  Service: Cardiovascular;  Laterality: N/A;  PACEMAKER INSERTION  12/03/2010  SJM Accent DR RF implanted by Dr Francyne  POLYPECTOMY  06/30/2023  Procedure: POLYPECTOMY;  Surgeon: Cindie Carlin POUR, DO;  Location: [facility];  Service: Endoscopy;;  RIGHT HEART CATH N/A 04/22/2018  Procedure: RIGHT HEART CATH;  Surgeon: Darron Deatrice LABOR, MD;  Location: [facility] INVASIVE CV  LAB;  Service: Cardiovascular;  Laterality: N/A;  RIGHT HEART CATH N/A 07/09/2022  Procedure: RIGHT HEART CATH;  Surgeon: Cherrie Toribio SAUNDERS, MD;  Location: [facility] INVASIVE CV LAB;  Service: Cardiovascular;  Laterality: N/A;  RIGHT HEART CATH N/A 05/03/2024  Procedure: RIGHT HEART CATH;  Surgeon: Cherrie Toribio SAUNDERS, MD;  Location: [facility] INVASIVE CV LAB;  Service: Cardiovascular;  Laterality: N/A;  TEE WITHOUT CARDIOVERSION N/A 07/30/2012  Procedure: TRANSESOPHAGEAL ECHOCARDIOGRAM (TEE);  Surgeon: Headland Francyne, MD;  Location: [facility] ENDOSCOPY;  Service: Cardiovascular;  Laterality: N/A;  h&p in file-hope  TEE WITHOUT CARDIOVERSION N/A 08/09/2013  Procedure: TRANSESOPHAGEAL ECHOCARDIOGRAM (TEE);  Surgeon: Redell GORMAN Shallow, MD;  Location: [facility] ENDOSCOPY;  Service: Cardiovascular;  Laterality: N/A;  THYROID  LOBECTOMY Right 09/20/2022  Procedure: RIGHT HEMITHYROIDECTOMY;  Surgeon: Llewellyn Gerard LABOR, DO;  Location: [facility] OR;  Service: ENT;  Laterality: Right;  TOTAL ABDOMINAL HYSTERECTOMY  1981  ULTRASOUND GUIDANCE FOR VASCULAR ACCESS  04/22/2018  Procedure: Ultrasound Guidance For Vascular Access;  Surgeon: Darron Deatrice LABOR, MD;  Location: [facility] INVASIVE CV LAB;  Service: Cardiovascular;; Norleen IVAR Blase, MA, CCC-SLP Speech Therapy 05/04/2024, 3:12 PM  DG Chest 1 View Result Date: 05/03/2024 CLINICAL DATA:  Status post right thoracentesis. EXAM: CHEST  1 VIEW COMPARISON:  05/02/2024 FINDINGS: Grossly stable enlarged cardiac silhouette. Stable left subclavian pacer leads. Moderate-sized right pleural effusion, decreased in size. Decreased associated right basilar atelectasis. No pneumothorax seen. Minimal left lower lobe atelectasis. Diffuse osteopenia. IMPRESSION: 1. No pneumothorax following right thoracentesis. 2. Moderate-sized right pleural effusion, decreased in size. 3. Decreased right basilar atelectasis. 4. Minimal left lower lobe atelectasis. Electronically Signed   By: Elspeth Bathe M.D.   On: 05/03/2024 15:00   CARDIAC  CATHETERIZATION Result Date: 05/03/2024 Findings: RA = 3 RV = 46/4 PA = 44/22 (32) PCW = 11 Fick cardiac output/index = 5.3/3.8 PVR = 4.0 WU Ao sat = 98% PA sat = 76% SVC sat = 75% PAPi = 7.3 Assessment: 1. Mild PAH with normal left-sided filling pressures and cardiac output 2. PA pressures and PCWP much improved from previous cath 3. No change in PVR with addition of low-dose PDE-5i Plan/Discussion: Will increase sildenafil  to 40 tid and f/u as outpatient to discuss trial of macitentan vs sotatercept. Please arrange f/u with me in [facility] to discuss in 1-2 months. D/w Dr. Croituro Daniel Bensimhon, MD 12:05 PM  IR THORACENTESIS ASP PLEURAL SPACE W/IMG GUIDE Result Date: 05/03/2024 INDICATION: 78 year old female with history of heart failure and recurrent right pleural effusion. Request for therapeutic and diagnostic thoracentesis. EXAM: ULTRASOUND GUIDED right THORACENTESIS MEDICATIONS: 10 mL 1% lidocaine  COMPLICATIONS: None immediate. PROCEDURE: An ultrasound guided thoracentesis was thoroughly discussed with the patient and questions answered. The benefits, risks, alternatives and complications were also discussed. The patient understands and wishes to proceed with the procedure. Written consent was obtained. Ultrasound was performed to localize and mark an adequate pocket of fluid in the right chest. The area was then prepped and draped in the normal sterile fashion. 1% Lidocaine  was used for local anesthesia. Under ultrasound guidance a 6 Fr Safe-T-Centesis catheter was introduced. Thoracentesis was performed. The catheter was removed and a dressing applied. FINDINGS: A total of approximately 900 mL of hazy yellow fluid was removed. Samples were sent to the laboratory as requested by the clinical team. Post thoracentesis chest x-ray reviewed with Dr. Jennefer, no pneumothorax. IMPRESSION: Successful ultrasound guided right thoracentesis yielding 900 mL of pleural fluid. Performed by: Aimee Han, PA-C.  Electronically Signed   By: Ester Jennefer M.D.   On: 05/03/2024 11:40   DG CHEST PORT 1 VIEW Result Date: 05/02/2024 CLINICAL DATA:  Chest pain. EXAM: PORTABLE CHEST 1 VIEW COMPARISON:  04/30/2024 FINDINGS: Left-sided pacemaker unchanged. Lungs are hypoinflated with persistent opacification over the right mid to lower lung likely moderate size effusion with associated basilar atelectasis. Slight interval improvement of this right-sided effusion. Left lung is clear. Infection over the right mid to lower lung is possible. Cardiomediastinal silhouette and remainder the exam is unchanged. IMPRESSION: Slight interval improvement opacification over the right mid to lower lung likely moderate size effusion with associated basilar atelectasis. Infection over the right mid to lower lung is possible. Electronically Signed   By: Toribio Agreste M.D.   On: 05/02/2024 12:49   CT ABDOMEN PELVIS W CONTRAST Result Date: 05/02/2024 CLINICAL DATA:  Abdominal pain. EXAM: CT ABDOMEN AND PELVIS WITH CONTRAST TECHNIQUE: Multidetector CT imaging of the abdomen and pelvis was performed using the standard protocol following bolus administration of intravenous contrast. RADIATION DOSE REDUCTION: This exam was performed according  to the departmental dose-optimization program which includes automated exposure control, adjustment of the mA and/or kV according to patient size and/or use of iterative reconstruction technique. CONTRAST:  75mL OMNIPAQUE  IOHEXOL  350 MG/ML SOLN COMPARISON:  06/19/2023. FINDINGS: Lower chest: Moderate to large right pleural effusion with right lower lobe atelectasis partial atelectasis of the dependent right middle lobe. There is some partly obscured nodularity with right middle lobe atelectasis. There are small nodulesa at the left lung base measuring up to 6 mm. Hepatobiliary: No focal liver abnormality is seen. Status post cholecystectomy. No biliary dilatation. Pancreas: Unremarkable. No pancreatic ductal  dilatation or surrounding inflammatory changes. Spleen: Normal in size without focal abnormality. Adrenals/Urinary Tract: No adrenal mass. Right kidney displaced inferiorly similar to the prior exam. Nonobstructing stone in the lower pole the right kidney, stable. Midpole stone on the left appears vascular. No renal mass. No hydronephrosis. Normal ureters. Foley catheter lies within the bladder with associated non dependent air. Stomach/Bowel: Stomach decompressed, otherwise unremarkable. Small bowel and colon are normal in caliber. No wall thickening or inflammation. Normal appendix visualized. Vascular/Lymphatic: Aortic atherosclerosis. No aneurysm. No enlarged lymph nodes. Reproductive: Status post hysterectomy. No adnexal masses. Other: No abdominal wall hernia or abnormality. No abdominopelvic ascites. Musculoskeletal: No fracture or acute finding.  No bone lesion. IMPRESSION: 1. No acute findings within the abdomen or pelvis. No bowel obstruction or inflammation. 2. Moderate to large right pleural effusion with associated atelectasis. 3. Lung base nodules, 1 of which was present on the CT from 06/19/2023. Nodules measure up to 6 mm. Non-contrast chest CT at 3-6 months is recommended. If the nodules are stable at time of repeat CT, then future CT at 18-24 months (from today's scan) is considered optional for low-risk patients, but is recommended for high-risk patients. This recommendation follows the consensus statement: Guidelines for Management of Incidental Pulmonary Nodules Detected on CT Images: From the Fleischner Society 2017; Radiology 2017; 284:228-243. 4. Aortic atherosclerosis. Electronically Signed   By: Alm Parkins M.D.   On: 05/02/2024 12:09   DG Abd 1 View Result Date: 04/30/2024 CLINICAL DATA:  Vomiting and constipation EXAM: ABDOMEN - 1 VIEW COMPARISON:  None Available. FINDINGS: The bowel gas pattern is normal. There surgical clips in the right abdomen. No radio-opaque calculi. There are  degenerative changes of the hips and spine. IMPRESSION: Nonobstructive bowel gas pattern. Electronically Signed   By: Greig Pique M.D.   On: 04/30/2024 20:56   DG Chest 1 View Result Date: 04/30/2024 CLINICAL DATA:  Status post thoracentesis. EXAM: CHEST  1 VIEW COMPARISON:  04/30/2024 FINDINGS: Right pleural effusion has decreased in size. Persistent blunting at the right costophrenic angle could represent residual pleural fluid, atelectasis or consolidation. Negative for pneumothorax. Left lung remains clear. Left chest pacemaker appears stable. Heart size within normal limits. IMPRESSION: 1. Decreased right pleural effusion following thoracentesis. Negative for pneumothorax. Electronically Signed   By: Juliene Balder M.D.   On: 04/30/2024 16:21   IR THORACENTESIS ASP PLEURAL SPACE W/IMG GUIDE Result Date: 04/30/2024 INDICATION: History of chronic CHF. Currently admitted with new found right pleural effusion. Request for diagnostic and therapeutic right thoracentesis. EXAM: ULTRASOUND GUIDED DIAGNOSTIC AND THERAPEUTIC RIGHT THORACENTESIS MEDICATIONS: 8 mL 1% lidocaine  COMPLICATIONS: None immediate. PROCEDURE: An ultrasound guided thoracentesis was thoroughly discussed with the patient and questions answered. The benefits, risks, alternatives and complications were also discussed. The patient understands and wishes to proceed with the procedure. Written consent was obtained. Ultrasound was performed to localize and mark an adequate pocket  of fluid in the right chest. The area was then prepped and draped in the normal sterile fashion. 1% Lidocaine  was used for local anesthesia. Under ultrasound guidance a 6 Fr Safe-T-Centesis catheter was introduced. Thoracentesis was performed. The catheter was removed and a dressing applied. FINDINGS: A total of approximately 1.1 liters of slightly hazy yellow fluid was removed. Samples were sent to the laboratory as requested by the clinical team. IMPRESSION: Successful  ultrasound guided right thoracentesis yielding 1.1 liters of pleural fluid. Performed and dictated by Kimble Clas, PA-C Electronically Signed   By: Cordella Banner   On: 04/30/2024 16:19   DG Chest 2 View Result Date: 04/30/2024 CLINICAL DATA:  Shortness of breath. Atrial fibrillation. Congestive heart failure. EXAM: CHEST - 2 VIEW COMPARISON:  11/07/2023 FINDINGS: New large right pleural effusion is seen, with right lung compressive atelectasis. Left lung remains clear. Heart size is stable. Pacemaker again noted. Stable mediastinal mass causing tracheal deviation to the right, consistent with known goiter as demonstrated on prior neck CT. IMPRESSION: New large right pleural effusion, with right lung compressive atelectasis. Stable mediastinal mass, consistent with known goiter. Electronically Signed   By: Norleen DELENA Kil M.D.   On: 04/30/2024 11:45   ECHOCARDIOGRAM COMPLETE Result Date: 04/28/2024    ECHOCARDIOGRAM REPORT   Patient Name:   SEVANNA BALLENGEE Date of Exam: 04/28/2024 Medical Rec #:  [id number]      Height:       59.0 in Accession #:    [id number]     Weight:       103.8 lb Date of Birth:  Oct 25, 1945      BSA:          1.396 m Patient Age:    78 years       BP:           161/83 mmHg Patient Gender: F              HR:           54 bpm. Exam Location:  Outpatient Procedure: 2D Echo, Cardiac Doppler and Color Doppler (Both Spectral and Color            Flow Doppler were utilized during procedure). Indications:    I27.20 Pulmonary Hypertension  History:        Patient has prior history of Echocardiogram examinations, most                 recent 07/01/2022. CHF, CAD, Pacemaker, Arrythmias:Atrial                 Fibrillation; Risk Factors:Hypertension, Sleep Apnea and                 Diabetes.  Sonographer:    Damien Senior RDCS Referring Phys: [phone] MIHAI CROITORU IMPRESSIONS  1. Left ventricular ejection fraction, by estimation, is 60 to 65%. The left ventricle has normal function. The left ventricle  has no regional wall motion abnormalities. Left ventricular diastolic parameters were normal.  2. Right ventricular systolic function is normal. The right ventricular size is severely enlarged. There is severely elevated pulmonary artery systolic pressure. The estimated right ventricular systolic pressure is 81.6 mmHg.  3. Left atrial size was mildly dilated.  4. The mitral valve is degenerative. Mild mitral valve regurgitation.  5. The tricuspid valve is abnormal. Tricuspid valve regurgitation is severe.  6. The aortic valve is tricuspid. Aortic valve regurgitation is mild. No aortic stenosis is present.  7. The inferior vena cava  is normal in size with <50% respiratory variability, suggesting right atrial pressure of 8 mmHg. Comparison(s): Changes from prior study are noted. No severely elevated pulmonary pressures and severely dilated RV. Conclusion(s)/Recommendation(s): Normal BiV systolic function. Severe TR and severely elevated pulmonary pressures. The RV is severely enlarged. FINDINGS  Left Ventricle: Left ventricular ejection fraction, by estimation, is 60 to 65%. The left ventricle has normal function. The left ventricle has no regional wall motion abnormalities. The left ventricular internal cavity size was normal in size. There is  no left ventricular hypertrophy. Left ventricular diastolic parameters were normal. Right Ventricle: The right ventricular size is severely enlarged. No increase in right ventricular wall thickness. Right ventricular systolic function is normal. There is severely elevated pulmonary artery systolic pressure. The tricuspid regurgitant velocity is 4.29 m/s, and with an assumed right atrial pressure of 8 mmHg, the estimated right ventricular systolic pressure is 81.6 mmHg. Left Atrium: Left atrial size was mildly dilated. Right Atrium: Right atrial size was normal in size. Pericardium: There is no evidence of pericardial effusion. Mitral Valve: The mitral valve is degenerative in  appearance. There is mild thickening of the mitral valve leaflet(s). Mild mitral valve regurgitation. Tricuspid Valve: The tricuspid valve is abnormal. Tricuspid valve regurgitation is severe. No evidence of tricuspid stenosis. The flow in the hepatic veins is reversed during ventricular systole. Aortic Valve: The aortic valve is tricuspid. Aortic valve regurgitation is mild. No aortic stenosis is present. Pulmonic Valve: The pulmonic valve was grossly normal. Pulmonic valve regurgitation is not visualized. No evidence of pulmonic stenosis. Aorta: The aortic root and ascending aorta are structurally normal, with no evidence of dilitation. Venous: The inferior vena cava is normal in size with less than 50% respiratory variability, suggesting right atrial pressure of 8 mmHg. IAS/Shunts: No atrial level shunt detected by color flow Doppler. Additional Comments: A device lead is visualized in the right atrium and right ventricle.  LEFT VENTRICLE PLAX 2D LVIDd:         4.20 cm   Diastology LVIDs:         2.50 cm   LV e' medial:    7.72 cm/s LV PW:         0.80 cm   LV E/e' medial:  11.3 LV IVS:        0.70 cm   LV e' lateral:   9.03 cm/s LVOT diam:     1.73 cm   LV E/e' lateral: 9.7 LV SV:         35 LV SV Index:   25 LVOT Area:     2.35 cm  RIGHT VENTRICLE RV S prime:     11.20 cm/s  PULMONARY VEINS TAPSE (M-mode): 1.4 cm      Diastolic Velocity: 51.90 cm/s                             S/D Velocity:       0.50                             Systolic Velocity:  28.30 cm/s LEFT ATRIUM             Index        RIGHT ATRIUM           Index LA diam:        3.58 cm 2.57 cm/m   RA Area:  13.20 cm LA Vol (A2C):   51.3 ml 36.76 ml/m  RA Volume:   30.20 ml  21.64 ml/m LA Vol (A4C):   55.6 ml 39.84 ml/m LA Biplane Vol: 58.6 ml 41.99 ml/m  AORTIC VALVE LVOT Vmax:   71.85 cm/s LVOT Vmean:  48.600 cm/s LVOT VTI:    0.149 m  AORTA Ao Root diam: 2.54 cm Ao Asc diam:  2.56 cm MITRAL VALVE               TRICUSPID VALVE MV Area  (PHT): 4.86 cm    TR Peak grad:   73.6 mmHg MV Decel Time: 156 msec    TR Vmax:        429.00 cm/s MV E velocity: 87.50 cm/s MV A velocity: 41.40 cm/s  SHUNTS MV E/A ratio:  2.11        Systemic VTI:  0.15 m                            Systemic Diam: 1.73 cm Georganna Archer Electronically signed by Georganna Archer Signature Date/Time: 04/28/2024/4:13:48 PM    Final    (Echo, Carotid, EGD, Colonoscopy, ERCP)    Subjective: Patient seen and examined.  She is more worried about constipation.  She had voided adequately without postvoid residual today.  Patient will be given a dose of lactulose and another dose of Dulcolax today before discharge. Patient was told about lupus and now she is worried more about it.  I told her that I am going to refer her to rheumatology to follow-up on the test results. Called and discussed with patient's daughter Ms Olam over the phone   Discharge Exam: Vitals:   05/06/24 0430 05/06/24 0851  BP: (!) 99/47 (!) 146/57  Pulse: 61 74  Resp: 16 16  Temp: 98 F (36.7 C) 98 F (36.7 C)  SpO2: 94% 98%   Vitals:   05/05/24 1542 05/05/24 1950 05/06/24 0430 05/06/24 0851  BP: (!) 134/58 132/66 (!) 99/47 (!) 146/57  Pulse: 80 84 61 74  Resp: 20 18 16 16   Temp: 97.7 F (36.5 C) 98 F (36.7 C) 98 F (36.7 C) 98 F (36.7 C)  TempSrc: Oral Oral Oral Oral  SpO2: 98% 98% 94% 98%  Weight:      Height:        General: Pt is alert, awake, not in acute distress Frail and debilitated.  On room air. Cardiovascular: RRR, S1/S2 +, no rubs, no gallops, pacemaker present. Respiratory: CTA bilaterally, no wheezing, no rhonchi, poor inspiratory effort mostly on the right side. Abdominal: Soft, NT, ND, bowel sounds + Extremities: no edema, no cyanosis    The results of significant diagnostics from this hospitalization (including imaging, microbiology, ancillary and laboratory) are listed below for reference.     Microbiology: Recent Results (from the past 240 hours)   Resp panel by RT-PCR (RSV, Flu A&B, Covid) Anterior Nasal Swab     Status: None   Collection Time: 04/30/24 11:06 AM   Specimen: Anterior Nasal Swab  Result Value Ref Range Status   SARS Coronavirus 2 by RT PCR NEGATIVE NEGATIVE Final   Influenza A by PCR NEGATIVE NEGATIVE Final   Influenza B by PCR NEGATIVE NEGATIVE Final    Comment: (NOTE) The Xpert Xpress SARS-CoV-2/FLU/RSV plus assay is intended as an aid in the diagnosis of influenza from Nasopharyngeal swab specimens and should not be used as a sole basis for treatment. Nasal  washings and aspirates are unacceptable for Xpert Xpress SARS-CoV-2/FLU/RSV testing.  Fact Sheet for Patients: bloggercourse.com  Fact Sheet for Healthcare Providers: seriousbroker.it  This test is not yet approved or cleared by the [place]  FDA and has been authorized for detection and/or diagnosis of SARS-CoV-2 by FDA under an Emergency Use Authorization (EUA). This EUA will remain in effect (meaning this test can be used) for the duration of the COVID-19 declaration under Section 564(b)(1) of the Act, 21 U.S.C. section 360bbb-3(b)(1), unless the authorization is terminated or revoked.     Resp Syncytial Virus by PCR NEGATIVE NEGATIVE Final    Comment: (NOTE) Fact Sheet for Patients: bloggercourse.com  Fact Sheet for Healthcare Providers: seriousbroker.it  This test is not yet approved or cleared by the [place]  FDA and has been authorized for detection and/or diagnosis of SARS-CoV-2 by FDA under an Emergency Use Authorization (EUA). This EUA will remain in effect (meaning this test can be used) for the duration of the COVID-19 declaration under Section 564(b)(1) of the Act, 21 U.S.C. section 360bbb-3(b)(1), unless the authorization is terminated or revoked.  Performed at [facility], [address], [place],  [place] [address]   Culture, body fluid w Gram Stain-bottle     Status: None   Collection Time: 04/30/24  3:54 PM   Specimen: Pleura  Result Value Ref Range Status   Specimen Description PLEURAL  Final   Special Requests RIGHT LUNG  Final   Culture   Final    NO GROWTH 5 DAYS Performed at [facility], [address] [place], [place] [address]    Report Status 05/05/2024 FINAL  Final  Gram stain     Status: None   Collection Time: 04/30/24  3:54 PM   Specimen: Pleura  Result Value Ref Range Status   Specimen Description PLEURAL  Final   Special Requests RIGHT LUNG  Final   Gram Stain   Final    WBC PRESENT,BOTH PMN AND MONONUCLEAR NO ORGANISMS SEEN CYTOSPIN SMEAR Performed at [facility], [address] [place], [place] [address]    Report Status 04/30/2024 FINAL  Final  Body fluid culture w Gram Stain     Status: None (Preliminary result)   Collection Time: 05/03/24  9:46 AM   Specimen: Lung, Right; Pleural Fluid  Result Value Ref Range Status   Specimen Description LUNG  Final   Special Requests NONE  Final   Gram Stain   Final    RARE WBC PRESENT,BOTH PMN AND MONONUCLEAR NO ORGANISMS SEEN    Culture   Final    NO GROWTH 2 DAYS Performed at [facility], [address] [place], [place] [address]    Report Status PENDING  Incomplete     Labs: BNP (last 3 results) Recent Labs    05/12/23 1626 04/30/24 1155  BNP 116.3* 151.2*   Basic Metabolic Panel: Recent Labs  Lab 05/01/24 0935 05/02/24 0318 05/02/24 1054 05/03/24 0622 05/03/24 1149 05/03/24 1154 05/04/24 0431 05/05/24 0415 05/06/24 0456  NA  --    < > 128* 129* 127*  126* 127* 127* 128* 128*  K  --    < > 4.7 4.5 4.7  4.7 4.7 4.6 4.2 4.4  CL  --    < > 91* 91*  --   --  90* 91* 90*  CO2  --    < > 28 29  --   --  31 32 29  GLUCOSE  --    < >  233* 113*  --   --  95 172* 148*  BUN  --    < > 20 19  --   --  27* 26* 23  CREATININE  --    < > 0.97 0.81  --   --  0.82 0.81 0.82   CALCIUM   --    < > 8.6* 8.6*  --   --  8.9 9.0 9.4  MG 1.8  --   --   --   --   --   --   --   --    < > = values in this interval not displayed.   Liver Function Tests: Recent Labs  Lab 05/02/24 1054  AST 14*  ALT 10  ALKPHOS 107  BILITOT 1.3*  PROT 5.9*  ALBUMIN  2.7*   Recent Labs  Lab 05/02/24 1054  LIPASE 23   No results for input(s): AMMONIA in the last 168 hours. CBC: Recent Labs  Lab 05/02/24 0318 05/03/24 0622 05/03/24 1149 05/03/24 1154 05/04/24 0431 05/05/24 0415 05/06/24 0456  WBC 10.8* 11.2*  --   --  13.0* 10.5 11.9*  HGB 12.2 11.8* 13.6  13.6 13.3 11.6* 11.2* 11.9*  HCT 39.0 38.0 40.0  40.0 39.0 36.5 35.6* 37.3  MCV 81.8 83.2  --   --  82.0 81.7 80.9  PLT 346 335  --   --  333 302 352   Cardiac Enzymes: No results for input(s): CKTOTAL, CKMB, CKMBINDEX, TROPONINI in the last 168 hours. BNP: Invalid input(s): POCBNP CBG: Recent Labs  Lab 05/04/24 0752 05/05/24 0742 05/05/24 1152 05/05/24 1541 05/06/24 0838  GLUCAP 114* 172* 198* 237* 120*   D-Dimer No results for input(s): DDIMER in the last 72 hours. Hgb A1c No results for input(s): HGBA1C in the last 72 hours. Lipid Profile No results for input(s): CHOL, HDL, LDLCALC, TRIG, CHOLHDL, LDLDIRECT in the last 72 hours. Thyroid  function studies No results for input(s): TSH, T4TOTAL, T3FREE, THYROIDAB in the last 72 hours.  Invalid input(s): FREET3 Anemia work up No results for input(s): VITAMINB12, FOLATE, FERRITIN, TIBC, IRON, RETICCTPCT in the last 72 hours. Urinalysis    Component Value Date/Time   COLORURINE YELLOW 04/30/2024 1512   APPEARANCEUR HAZY (A) 04/30/2024 1512   APPEARANCEUR Cloudy (A) 08/05/2023 1327   LABSPEC 1.009 04/30/2024 1512   PHURINE 5.0 04/30/2024 1512   GLUCOSEU NEGATIVE 04/30/2024 1512   HGBUR MODERATE (A) 04/30/2024 1512   BILIRUBINUR NEGATIVE 04/30/2024 1512   BILIRUBINUR Negative 08/05/2023 1327    KETONESUR NEGATIVE 04/30/2024 1512   PROTEINUR NEGATIVE 04/30/2024 1512   UROBILINOGEN negative (A) 04/15/2023 1403   UROBILINOGEN 0.2 02/26/2017 1930   NITRITE NEGATIVE 04/30/2024 1512   LEUKOCYTESUR NEGATIVE 04/30/2024 1512   Sepsis Labs Recent Labs  Lab 05/03/24 0622 05/04/24 0431 05/05/24 0415 05/06/24 0456  WBC 11.2* 13.0* 10.5 11.9*   Microbiology Recent Results (from the past 240 hours)  Resp panel by RT-PCR (RSV, Flu A&B, Covid) Anterior Nasal Swab     Status: None   Collection Time: 04/30/24 11:06 AM   Specimen: Anterior Nasal Swab  Result Value Ref Range Status   SARS Coronavirus 2 by RT PCR NEGATIVE NEGATIVE Final   Influenza A by PCR NEGATIVE NEGATIVE Final   Influenza B by PCR NEGATIVE NEGATIVE Final    Comment: (NOTE) The Xpert Xpress SARS-CoV-2/FLU/RSV plus assay is intended as an aid in the diagnosis of influenza from Nasopharyngeal swab specimens and should not be used as a sole basis for  treatment. Nasal washings and aspirates are unacceptable for Xpert Xpress SARS-CoV-2/FLU/RSV testing.  Fact Sheet for Patients: bloggercourse.com  Fact Sheet for Healthcare Providers: seriousbroker.it  This test is not yet approved or cleared by the [place]  FDA and has been authorized for detection and/or diagnosis of SARS-CoV-2 by FDA under an Emergency Use Authorization (EUA). This EUA will remain in effect (meaning this test can be used) for the duration of the COVID-19 declaration under Section 564(b)(1) of the Act, 21 U.S.C. section 360bbb-3(b)(1), unless the authorization is terminated or revoked.     Resp Syncytial Virus by PCR NEGATIVE NEGATIVE Final    Comment: (NOTE) Fact Sheet for Patients: bloggercourse.com  Fact Sheet for Healthcare Providers: seriousbroker.it  This test is not yet approved or cleared by the [place]  FDA and has been  authorized for detection and/or diagnosis of SARS-CoV-2 by FDA under an Emergency Use Authorization (EUA). This EUA will remain in effect (meaning this test can be used) for the duration of the COVID-19 declaration under Section 564(b)(1) of the Act, 21 U.S.C. section 360bbb-3(b)(1), unless the authorization is terminated or revoked.  Performed at [facility], [address] [place], [place] [address]   Culture, body fluid w Gram Stain-bottle     Status: None   Collection Time: 04/30/24  3:54 PM   Specimen: Pleura  Result Value Ref Range Status   Specimen Description PLEURAL  Final   Special Requests RIGHT LUNG  Final   Culture   Final    NO GROWTH 5 DAYS Performed at [facility], [address] [place], [place] [address]    Report Status 05/05/2024 FINAL  Final  Gram stain     Status: None   Collection Time: 04/30/24  3:54 PM   Specimen: Pleura  Result Value Ref Range Status   Specimen Description PLEURAL  Final   Special Requests RIGHT LUNG  Final   Gram Stain   Final    WBC PRESENT,BOTH PMN AND MONONUCLEAR NO ORGANISMS SEEN CYTOSPIN SMEAR Performed at [facility], [address], [place], [place] [address]    Report Status 04/30/2024 FINAL  Final  Body fluid culture w Gram Stain     Status: None (Preliminary result)   Collection Time: 05/03/24  9:46 AM   Specimen: Lung, Right; Pleural Fluid  Result Value Ref Range Status   Specimen Description LUNG  Final   Special Requests NONE  Final   Gram Stain   Final    RARE WBC PRESENT,BOTH PMN AND MONONUCLEAR NO ORGANISMS SEEN    Culture   Final    NO GROWTH 2 DAYS Performed at [facility], [address] [place], [place] [address]    Report Status PENDING  Incomplete     Time coordinating discharge: 35 minutes  SIGNED:   Renato Applebaum, MD  [facility] 05/06/2024, 10:13 AM

## 2024-05-07 ENCOUNTER — Ambulatory Visit: Payer: Self-pay

## 2024-05-07 ENCOUNTER — Telehealth: Payer: Self-pay

## 2024-05-07 LAB — CUP PACEART REMOTE DEVICE CHECK
Battery Remaining Longevity: 50 mo
Battery Remaining Percentage: 63 %
Battery Voltage: 3.01 V
Brady Statistic RV Percent Paced: 77 %
Date Time Interrogation Session: 20251204154103
Implantable Lead Connection Status: 753985
Implantable Lead Connection Status: 753985
Implantable Lead Connection Status: 753985
Implantable Lead Implant Date: 20120702
Implantable Lead Implant Date: 20120702
Implantable Lead Implant Date: 20210308
Implantable Lead Location: 753859
Implantable Lead Location: 753860
Implantable Lead Location: 753860
Implantable Pulse Generator Implant Date: 20210308
Lead Channel Impedance Value: 610 Ohm
Lead Channel Pacing Threshold Amplitude: 0.5 V
Lead Channel Pacing Threshold Pulse Width: 0.5 ms
Lead Channel Sensing Intrinsic Amplitude: 12 mV
Lead Channel Setting Pacing Amplitude: 5 V
Lead Channel Setting Pacing Pulse Width: 0.5 ms
Lead Channel Setting Sensing Sensitivity: 2 mV
Pulse Gen Model: 2272
Pulse Gen Serial Number: 3801383

## 2024-05-07 NOTE — Telephone Encounter (Signed)
 FYI Only or Action Required?: FYI only for provider: ED advised.  Patient was last seen in primary care on 04/23/2024 by Medina-Vargas, Jereld BROCKS, NP.  Called Nurse Triage reporting Urinary Retention.  Symptoms began yesterday.  Interventions attempted: Prescription medications:  lasix , flomax , sildenafil , miralax  and senokot.  Symptoms are: gradually worsening.  Triage Disposition: Go to ED Now (Notify PCP)  Patient/caregiver understands and will follow disposition?: Yes  Copied from CRM [id number]. Topic: Clinical - Red Word Triage >> May 07, 2024  4:29 PM Graeme ORN wrote: Red Word that prompted transfer to Nurse Triage: Unable to urinate but full of fluid. Nauseous, gagging. Recently in hospital/ED and has f/u scheduled. Reason for Disposition  [1] Unable to urinate (or only a few drops) > 4 hours AND [2] bladder feels very full (e.g., palpable bladder or strong urge to urinate)  Answer Assessment - Initial Assessment Questions Spoke with pt and pts son Jama. Reports difficulty emptying bladder since late yesterday. Bladder feels very full and hasn't been able to urinate more than a few drops today. Denies CP, SOB or swelling in BLE. Noted to have been admitted  to hospital from 11/28 - 12/1 for complaints of difficulty urinating and acute on chronic CHF and pleural effusion. Had thoracentesis x2, heart cath, and foley catheter placed at that time. Diuresed. Discharge with lasix , flomax , sildenafil , miralax  and senokot. Hasn't missed any doses of medications. Vitals this morning 152/66, HR 82, Temp 96.9 F, CBG 171. Advised ED now, reports someone can take her today. Advised 911 for worsening symptoms.   1. SYMPTOM: What's the main symptom you're concerned about? (e.g., frequency, incontinence)     Unable to empty bladder  2. ONSET: When did the urine retention start?     Late yesterday, noticeably worse today  3. PAIN: Is there any pain? If Yes, ask: How bad is it? (Scale:  1-10; mild, moderate, severe)     Pressure in pelvic area, denies pain  4. CAUSE: What do you think is causing the symptoms?     Urine retention  5. OTHER SYMPTOMS: Do you have any other symptoms? (e.g., blood in urine, fever, flank pain, pain with urination)     Denies blood in urine or fever. Ongoing chronic nausea.  Protocols used: Urinary Symptoms-A-AH

## 2024-05-07 NOTE — Transitions of Care (Post Inpatient/ED Visit) (Signed)
 05/07/2024  Name: Hailey [name] MRN: [id number] DOB: 1945-06-13  Today's TOC FU Call Status: Today's TOC FU Call Status:: Successful TOC FU Call Completed TOC FU Call Complete Date: 05/07/24  Patient's Name and Date of Birth confirmed. DOB, Name  Transition Care Management Follow-up Telephone Call Date of Discharge: 05/06/24 Discharge Facility: [facility]) Type of Discharge: Inpatient Admission Primary Inpatient Discharge Diagnosis:: Pleural effusion  - s/p cardiac cath 12/2 with Dr Cherrie How have you been since you were released from the hospital?: Better Any questions or concerns?: No  Items Reviewed: Did you receive and understand the discharge instructions provided?: Yes Medications obtained,verified, and reconciled?: Yes (Medications Reviewed) Any new allergies since your discharge?: No Dietary orders reviewed?: Yes Type of Diet Ordered:: low sodium heart healthy Do you have support at home?: Yes People in Home [RPT]: spouse, child(ren), adult Name of Support/Comfort Primary Source: spouse (patient reports spouse has dementia and Parkinsons) lives with patient and she has 2 daughters and a son who help as needed and states they have paid caregiver through [employer] currently with 6 hour shifts daily  Medications Reviewed Today: Medications Reviewed Today     Reviewed by Lauro [name], RN (Registered Nurse) on 05/07/24 at 1002  Med List Status: <None>   Medication Order Taking? Sig Documenting Provider Last Dose Status Informant  acetaminophen  (TYLENOL ) 500 MG tablet [id number] Yes Take 500-1,000 mg by mouth every 6 (six) hours as needed (pain.). [provider]  Active Self, Family Member, Pharmacy Records           Med Note DELETA, [name]   Fri Apr 30, 2024  1:22 PM)    albuterol  (VENTOLIN  HFA) 108 [phone] Base) MCG/ACT inhaler [id number] Yes Inhale 2 puffs into the lungs every 6 (six) hours as needed for wheezing or shortness of breath. Medina-Vargas,  Monina C, NP  Active Self, Family Member, Pharmacy Records  Cholecalciferol  (VITAMIN D3) 1000 UNITS CAPS [id number] Yes Take 1,000 Units by mouth in the morning. [provider]  Active Self, Family Member, Pharmacy Records  ELIQUIS  5 MG TABS tablet [id number] Yes TAKE ONE TABLET BY MOUTH TWICE DAILY Croitoru, Mihai, MD  Active Self, Family Member, Pharmacy Records  fluticasone  (FLONASE ) 50 MCG/ACT nasal spray [id number] Yes Place 2 sprays into both nostrils daily as needed for allergies or rhinitis. Medina-Vargas, Monina C, NP  Active Self, Family Member, Pharmacy Records  furosemide  (LASIX ) 40 MG tablet [id number] Yes Take 1 tablet (40 mg total) by mouth 2 (two) times daily. Raenelle Coria, MD  Active   loperamide  (IMODIUM  A-D) 2 MG tablet [id number]  Take 2 mg by mouth.  Patient not taking: Reported on 05/07/2024   [provider]  Active Self, Family Member, Pharmacy Records  loratadine  (CLARITIN ) 10 MG tablet [id number] Yes Take 1 tablet (10 mg total) by mouth daily. Medina-Vargas, Monina C, NP  Active Self, Family Member, Pharmacy Records  methimazole  (TAPAZOLE ) 10 MG tablet [id number] Yes Take 20 mg (two tablets) daily. [provider]  Active Self, Family Member, Pharmacy Records  ondansetron  (ZOFRAN ) 4 MG tablet [id number] Yes TAKE ONE TABLET BY MOUTH EVERY EIGHT HOURS AS NEEDED FOR NAUSEA OR VOMITING Sherlynn Madden, MD  Active Self, Family Member, Pharmacy Records           Med Note DELETA, [name]   Fri Apr 30, 2024  1:20 PM)    pantoprazole  (PROTONIX ) 40 MG tablet [id number] Yes TAKE ONE TABLET BY MOUTH TWICE DAILY BEFORE A MEAL  Rudy Josette RAMAN, PA-C  Active Self, Family Member, Pharmacy Records  polyethylene glycol powder (GLYCOLAX /MIRALAX ) 17 GM/SCOOP powder [id number] Yes Take 17 g by mouth 2 (two) times daily. Dissolve 1 capful (17g) in 4-8 ounces of liquid and take by mouth daily. Raenelle Coria, MD  Active   predniSONE  (DELTASONE ) 10 MG tablet [id number]  Yes Take 2 tablets (20 mg total) by mouth daily with breakfast for 3 days, THEN 1 tablet (10 mg total) daily with breakfast for 3 days. Raenelle Coria, MD  Active   senna-docusate (SENOKOT-S) 8.6-50 MG tablet [id number] Yes Take 2 tablets by mouth 2 (two) times daily. Raenelle Coria, MD  Active   sildenafil  (REVATIO ) 20 MG tablet [id number] Yes Take 2 tablets (40 mg total) by mouth 3 (three) times daily. Raenelle Coria, MD  Active   sodium chloride  (NASAL MOISTURIZING SPRAY) 0.65 % nasal spray [id number] Yes Place 1 spray into the nose as needed for congestion. [provider]  Active Self, Family Member, Pharmacy Records  tamsulosin  (FLOMAX ) 0.4 MG CAPS capsule [id number] Yes Take 1 capsule (0.4 mg total) by mouth daily. Raenelle Coria, MD  Active   tizanidine  (ZANAFLEX ) 2 MG capsule [id number] Yes Take 2 mg by mouth at bedtime. [provider]  Active Self, Family Member, Pharmacy Records  traMADol  (ULTRAM ) 50 MG tablet [id number] Yes Take 50-100 mg by mouth every 8 (eight) hours as needed for moderate pain (pain score 4-6). [provider]  Active Self, Family Member, Pharmacy Records           Med Note DELETA, [place] R   Fri Apr 30, 2024  1:21 PM)              Home Care and Equipment/Supplies: Were Home Health Services Ordered?: Yes Name of Home Health Agency:: [facility] (patient states agency called last night but did not schedule yet due to possibility of bad weather - conference call made with patient - Spoke with Crystal who states her nurse lives in [place]  and they did get snow - Crystal will check with nurse) Has Agency set up a time to come to your home?: No Any new equipment or medical supplies ordered?: No  Functional Questionnaire: Do you need assistance with bathing/showering or dressing?: No Do you need assistance with meal preparation?: Yes (the children are cooking) Do you need assistance with eating?: No Do you have difficulty maintaining  continence: Yes (some occasional incontince had bowel incontince yesterday with medication to help her move her bowels) Do you need assistance with getting out of bed/getting out of a chair/moving?: No (using walker to steady self) Do you have difficulty managing or taking your medications?: No  Follow up appointments reviewed: PCP Follow-up appointment confirmed?: No (patient repeatedly states she has an appt with Dr C - cardio and doesn't need PCP follow up) Specialist Hospital Follow-up appointment confirmed?: Yes Date of Specialist follow-up appointment?: 05/14/24 Follow-Up Specialty Provider:: Dr Jerel Croitoru 12/12 Pacer check      [facility] Do you need transportation to your follow-up appointment?: No Do you understand care options if your condition(s) worsen?: Yes-patient verbalized understanding  SDOH Interventions Today    Flowsheet Row Most Recent Value  SDOH Interventions   Food Insecurity Interventions Intervention Not Indicated  Housing Interventions Intervention Not Indicated  Transportation Interventions Intervention Not Indicated  Utilities Interventions Intervention Not Indicated    Goals Addressed             This Visit's Progress  VBCI Transitions of Care (TOC) Care Plan       Problems:  Recent Hospitalization for treatment of Pleural effusion  - s/p cardiac cath 12/2 with Dr Cherrie No Hospital Follow Up Provider appointment [facility] RN attempted to schedule PCP hospital follow up but patient declined  Patient lives at home with her husband who has Parkinsons and dementia - also has paid caregiver [employer] (medical)) patient states they work 6 hour shifts daily and 3 children who assist  Patient referred to rheumatology r/t lupus  Goal:  Over the next 30 days, the patient will not experience hospital readmission  Interventions:  Transitions of Care: Doctor Visits  - discussed the importance of doctor visits Post discharge activity  limitations prescribed by provider reviewed Reviewed Signs and symptoms of infection Conference call was made with patient to [facility] [phone]- spoke with Crystal who stats the nurse who was to see patient lives in [place]  where they received snow - she will call that nurse and have her call patient.   CAD Interventions: Assessed understanding of CAD diagnosis Medications reviewed including medications utilized in CAD treatment plan Provided education on importance of blood pressure control in management of CAD Provided education on Importance of limiting foods high in cholesterol Reviewed Importance of taking all medications as prescribed Reviewed Importance of attending all scheduled provider appointments   Heart Failure Interventions: Provided education on low sodium diet Assessed need for readable accurate scales in home Provided education about placing scale on hard, flat surface Advised patient to weigh each morning after emptying bladder Discussed importance of daily weight and advised patient to weigh and record daily  Patient Self Care Activities:  Attend all scheduled provider appointments Call pharmacy for medication refills 3-7 days in advance of running out of medications Call provider office for new concerns or questions  Notify RN Care Manager of TOC call rescheduling needs Participate in Transition of Care Program/Attend TOC scheduled calls Take medications as prescribed   call office if I gain more than 2 pounds in one day or 3-4 pounds in one to two days track weight in diary use salt in moderation watch for swelling in feet, ankles and legs every day weigh myself daily know when to call the doctor:TOC RN reviewed the following discharge instructions with patient who was looking at the discharge summary: Call MD for:  difficulty breathing, headache or visual disturbances Diet - low sodium heart healthy Discharge instructions Continue to work  with therapies and also do breathing exercises at home Increase activity slowlyRecord daily weight on same scale at same time of day. If your doctor did not discuss your diet or activity in the information above, please follow a Heart Healthy Low  Sodium diet and increase your activity as you feel able. Call your doctor: (Anytime you feel any of the following symptoms) 3-4 pound weight gain in 1-2 days or 2 pounds overnight Shortness of breath, with or without a dry hacking cough Swelling in the hands, feet or stomach  - no swelling at this time  If you have to sleep on extra pillows at night in order to breathe  Plan:  Telephone follow up appointment with care management team member scheduled for:  05/14/24 in the afternoon The patient has been provided with contact information for the care management team and has been advised to call with any health related questions or concerns.         Shona Prow RN, CCM [place]  VBCI-Population Health RN  Care Manager [phone]

## 2024-05-07 NOTE — Patient Instructions (Signed)
 Visit Information  Thank you for taking time to visit with me today. Please don't hesitate to contact me if I can be of assistance to you before our next scheduled telephone appointment.  Our next appointment is by telephone on 05/14/24 in the afternoon  Following is a copy of your care plan:   Goals Addressed             This Visit's Progress    VBCI Transitions of Care (TOC) Care Plan       Problems:  Recent Hospitalization for treatment of Pleural effusion  - s/p cardiac cath 12/2 with Dr Cherrie No Hospital Follow Up Provider appointment [facility] RN attempted to schedule PCP hospital follow up but patient declined  Patient lives at home with her husband who has Parkinsons and dementia - also has paid caregiver [employer] (medical)) patient states they work 6 hour shifts daily and 3 children who assist  Patient referred to rheumatology r/t lupus  Goal:  Over the next 30 days, the patient will not experience hospital readmission  Interventions:  Transitions of Care: Doctor Visits  - discussed the importance of doctor visits Post discharge activity limitations prescribed by provider reviewed Reviewed Signs and symptoms of infection Conference call was made with patient to [facility] [phone]- spoke with Crystal who stats the nurse who was to see patient lives in [place]  where they received snow - she will call that nurse and have her call patient.   CAD Interventions: Assessed understanding of CAD diagnosis Medications reviewed including medications utilized in CAD treatment plan Provided education on importance of blood pressure control in management of CAD Provided education on Importance of limiting foods high in cholesterol Reviewed Importance of taking all medications as prescribed Reviewed Importance of attending all scheduled provider appointments   Heart Failure Interventions: Provided education on low sodium diet Assessed need for readable accurate scales in  home Provided education about placing scale on hard, flat surface Advised patient to weigh each morning after emptying bladder Discussed importance of daily weight and advised patient to weigh and record daily  Patient Self Care Activities:  Attend all scheduled provider appointments Call pharmacy for medication refills 3-7 days in advance of running out of medications Call provider office for new concerns or questions  Notify RN Care Manager of TOC call rescheduling needs Participate in Transition of Care Program/Attend TOC scheduled calls Take medications as prescribed   call office if I gain more than 2 pounds in one day or 3-4 pounds in one to two days track weight in diary use salt in moderation watch for swelling in feet, ankles and legs every day weigh myself daily know when to call the doctor:TOC RN reviewed the following discharge instructions with patient who was looking at the discharge summary: Call MD for:  difficulty breathing, headache or visual disturbances Diet - low sodium heart healthy Discharge instructions Continue to work with therapies and also do breathing exercises at home Increase activity slowlyRecord daily weight on same scale at same time of day. If your doctor did not discuss your diet or activity in the information above, please follow a Heart Healthy Low  Sodium diet and increase your activity as you feel able. Call your doctor: (Anytime you feel any of the following symptoms) 3-4 pound weight gain in 1-2 days or 2 pounds overnight Shortness of breath, with or without a dry hacking cough Swelling in the hands, feet or stomach  - no swelling at this time  If you  have to sleep on extra pillows at night in order to breathe  Plan:  Telephone follow up appointment with care management team member scheduled for:  05/14/24 in the afternoon The patient has been provided with contact information for the care management team and has been advised to call with any  health related questions or concerns.         Patient verbalizes understanding of instructions and care plan provided today and agrees to view in MyChart. Active MyChart status and patient understanding of how to access instructions and care plan via MyChart confirmed with patient.     Telephone follow up appointment with care management team member scheduled for: 05/14/24 The patient has been provided with contact information for the care management team and has been advised to call with any health related questions or concerns.   Please call the care guide team at [phone] if you need to cancel or reschedule your appointment.   Please call the Suicide and Crisis Lifeline: 988 call 1-800-273-TALK (toll free, 24 hour hotline) call 911 if you are experiencing a Mental Health or Behavioral Health Crisis or need someone to talk to.  Shona Prow RN, CCM [place]  VBCI-Population Health RN Care Manager [phone]

## 2024-05-08 ENCOUNTER — Encounter: Payer: Self-pay | Admitting: Adult Health

## 2024-05-08 LAB — CARDIOLIPIN ANTIBODIES, IGG, IGM, IGA
Anticardiolipin IgA: <9 U/mL (ref 0–11)
Anticardiolipin IgG: <9 GPL U/mL (ref 0–14)
Anticardiolipin IgM: <9 [MPL'U]/mL (ref 0–12)

## 2024-05-10 ENCOUNTER — Ambulatory Visit: Payer: Self-pay | Admitting: Cardiovascular Disease

## 2024-05-10 ENCOUNTER — Other Ambulatory Visit: Payer: Self-pay | Admitting: Adult Health

## 2024-05-10 ENCOUNTER — Other Ambulatory Visit: Payer: Self-pay | Admitting: Sports Medicine

## 2024-05-10 DIAGNOSIS — R112 Nausea with vomiting, unspecified: Secondary | ICD-10-CM

## 2024-05-10 LAB — MISC LABCORP TEST (SEND OUT): Labcorp test code: 9985

## 2024-05-10 NOTE — Telephone Encounter (Signed)
 Noted

## 2024-05-10 NOTE — Telephone Encounter (Signed)
 Last time prescription was filled was 12/18/23, not sure if you want to refill. Please advise

## 2024-05-10 NOTE — Telephone Encounter (Signed)
 Called and spoke with Son and he stated that patient was doing much better and will call if symptoms return.

## 2024-05-11 ENCOUNTER — Inpatient Hospital Stay: Admitting: Adult Health

## 2024-05-11 NOTE — Progress Notes (Signed)
 Remote PPM Transmission

## 2024-05-12 ENCOUNTER — Emergency Department (HOSPITAL_COMMUNITY)

## 2024-05-12 ENCOUNTER — Ambulatory Visit: Payer: Self-pay

## 2024-05-12 ENCOUNTER — Ambulatory Visit (HOSPITAL_COMMUNITY)

## 2024-05-12 ENCOUNTER — Telehealth: Payer: Self-pay

## 2024-05-12 ENCOUNTER — Inpatient Hospital Stay (HOSPITAL_COMMUNITY)
Admission: EM | Admit: 2024-05-12 | Discharge: 2024-05-24 | DRG: 187 | Disposition: A | Discharge status: Skilled Nursing Facility | Attending: Family Medicine | Admitting: Family Medicine

## 2024-05-12 ENCOUNTER — Other Ambulatory Visit: Payer: Self-pay

## 2024-05-12 ENCOUNTER — Encounter (HOSPITAL_COMMUNITY): Payer: Self-pay

## 2024-05-12 DIAGNOSIS — I4821 Permanent atrial fibrillation: Secondary | ICD-10-CM | POA: Diagnosis present

## 2024-05-12 DIAGNOSIS — E119 Type 2 diabetes mellitus without complications: Secondary | ICD-10-CM | POA: Diagnosis present

## 2024-05-12 DIAGNOSIS — Z7901 Long term (current) use of anticoagulants: Secondary | ICD-10-CM

## 2024-05-12 DIAGNOSIS — R918 Other nonspecific abnormal finding of lung field: Secondary | ICD-10-CM | POA: Diagnosis not present

## 2024-05-12 DIAGNOSIS — E876 Hypokalemia: Secondary | ICD-10-CM | POA: Diagnosis present

## 2024-05-12 DIAGNOSIS — Z79899 Other long term (current) drug therapy: Secondary | ICD-10-CM

## 2024-05-12 DIAGNOSIS — K219 Gastro-esophageal reflux disease without esophagitis: Secondary | ICD-10-CM | POA: Diagnosis present

## 2024-05-12 DIAGNOSIS — Z833 Family history of diabetes mellitus: Secondary | ICD-10-CM

## 2024-05-12 DIAGNOSIS — Z950 Presence of cardiac pacemaker: Secondary | ICD-10-CM

## 2024-05-12 DIAGNOSIS — Z885 Allergy status to narcotic agent status: Secondary | ICD-10-CM

## 2024-05-12 DIAGNOSIS — E871 Hypo-osmolality and hyponatremia: Secondary | ICD-10-CM | POA: Diagnosis present

## 2024-05-12 DIAGNOSIS — I110 Hypertensive heart disease with heart failure: Secondary | ICD-10-CM | POA: Diagnosis present

## 2024-05-12 DIAGNOSIS — Z8249 Family history of ischemic heart disease and other diseases of the circulatory system: Secondary | ICD-10-CM

## 2024-05-12 DIAGNOSIS — R5381 Other malaise: Secondary | ICD-10-CM

## 2024-05-12 DIAGNOSIS — E0590 Thyrotoxicosis, unspecified without thyrotoxic crisis or storm: Secondary | ICD-10-CM | POA: Diagnosis present

## 2024-05-12 DIAGNOSIS — E861 Hypovolemia: Secondary | ICD-10-CM | POA: Diagnosis present

## 2024-05-12 DIAGNOSIS — R109 Unspecified abdominal pain: Secondary | ICD-10-CM | POA: Diagnosis not present

## 2024-05-12 DIAGNOSIS — Z803 Family history of malignant neoplasm of breast: Secondary | ICD-10-CM

## 2024-05-12 DIAGNOSIS — R64 Cachexia: Secondary | ICD-10-CM | POA: Diagnosis present

## 2024-05-12 DIAGNOSIS — K5900 Constipation, unspecified: Secondary | ICD-10-CM | POA: Diagnosis present

## 2024-05-12 DIAGNOSIS — I2721 Secondary pulmonary arterial hypertension: Secondary | ICD-10-CM | POA: Diagnosis present

## 2024-05-12 DIAGNOSIS — R339 Retention of urine, unspecified: Secondary | ICD-10-CM | POA: Diagnosis present

## 2024-05-12 DIAGNOSIS — I2510 Atherosclerotic heart disease of native coronary artery without angina pectoris: Secondary | ICD-10-CM | POA: Diagnosis present

## 2024-05-12 DIAGNOSIS — I5032 Chronic diastolic (congestive) heart failure: Secondary | ICD-10-CM | POA: Diagnosis present

## 2024-05-12 DIAGNOSIS — Z8042 Family history of malignant neoplasm of prostate: Secondary | ICD-10-CM

## 2024-05-12 DIAGNOSIS — G4730 Sleep apnea, unspecified: Secondary | ICD-10-CM | POA: Diagnosis present

## 2024-05-12 DIAGNOSIS — Z681 Body mass index (BMI) 19 or less, adult: Secondary | ICD-10-CM

## 2024-05-12 DIAGNOSIS — Z8612 Personal history of poliomyelitis: Secondary | ICD-10-CM

## 2024-05-12 DIAGNOSIS — E785 Hyperlipidemia, unspecified: Secondary | ICD-10-CM | POA: Diagnosis present

## 2024-05-12 DIAGNOSIS — Z9049 Acquired absence of other specified parts of digestive tract: Secondary | ICD-10-CM

## 2024-05-12 DIAGNOSIS — D72829 Elevated white blood cell count, unspecified: Secondary | ICD-10-CM | POA: Diagnosis present

## 2024-05-12 DIAGNOSIS — Z888 Allergy status to other drugs, medicaments and biological substances status: Secondary | ICD-10-CM

## 2024-05-12 DIAGNOSIS — R54 Age-related physical debility: Secondary | ICD-10-CM | POA: Diagnosis present

## 2024-05-12 DIAGNOSIS — E890 Postprocedural hypothyroidism: Secondary | ICD-10-CM | POA: Diagnosis present

## 2024-05-12 DIAGNOSIS — J90 Pleural effusion, not elsewhere classified: Secondary | ICD-10-CM | POA: Diagnosis not present

## 2024-05-12 DIAGNOSIS — R1310 Dysphagia, unspecified: Secondary | ICD-10-CM | POA: Diagnosis present

## 2024-05-12 DIAGNOSIS — I495 Sick sinus syndrome: Secondary | ICD-10-CM | POA: Diagnosis present

## 2024-05-12 DIAGNOSIS — Z90710 Acquired absence of both cervix and uterus: Secondary | ICD-10-CM

## 2024-05-12 DIAGNOSIS — R531 Weakness: Secondary | ICD-10-CM

## 2024-05-12 DIAGNOSIS — Z66 Do not resuscitate: Secondary | ICD-10-CM | POA: Diagnosis present

## 2024-05-12 DIAGNOSIS — R0602 Shortness of breath: Secondary | ICD-10-CM | POA: Diagnosis not present

## 2024-05-12 DIAGNOSIS — Z91040 Latex allergy status: Secondary | ICD-10-CM

## 2024-05-12 DIAGNOSIS — Z800 Family history of malignant neoplasm of digestive organs: Secondary | ICD-10-CM

## 2024-05-12 DIAGNOSIS — J9811 Atelectasis: Secondary | ICD-10-CM | POA: Diagnosis present

## 2024-05-12 DIAGNOSIS — R590 Localized enlarged lymph nodes: Secondary | ICD-10-CM | POA: Diagnosis present

## 2024-05-12 LAB — COMPREHENSIVE METABOLIC PANEL WITH GFR
ALT: 16 U/L (ref 0–44)
AST: 16 U/L (ref 15–41)
Albumin: 3.2 g/dL — ABNORMAL LOW (ref 3.5–5.0)
Alkaline Phosphatase: 118 U/L (ref 38–126)
Anion gap: 12 (ref 5–15)
BUN: 12 mg/dL (ref 8–23)
CO2: 30 mmol/L (ref 22–32)
Calcium: 8.3 mg/dL — ABNORMAL LOW (ref 8.9–10.3)
Chloride: 84 mmol/L — ABNORMAL LOW (ref 98–111)
Creatinine, Ser: 0.68 mg/dL (ref 0.44–1.00)
GFR, Estimated: >60 mL/min (ref >60)
Glucose, Bld: 192 mg/dL — ABNORMAL HIGH (ref 70–99)
Potassium: 3.3 mmol/L — ABNORMAL LOW (ref 3.5–5.1)
Sodium: 126 mmol/L — ABNORMAL LOW (ref 135–145)
Total Bilirubin: 1 mg/dL (ref 0.0–1.2)
Total Protein: 6.6 g/dL (ref 6.5–8.1)

## 2024-05-12 LAB — URINALYSIS, ROUTINE W REFLEX MICROSCOPIC
Bilirubin Urine: NEGATIVE
Glucose, UA: NEGATIVE mg/dL
Ketones, ur: NEGATIVE mg/dL
Leukocytes,Ua: NEGATIVE
Nitrite: NEGATIVE
Protein, ur: NEGATIVE mg/dL
Specific Gravity, Urine: <1.005 — ABNORMAL LOW (ref 1.005–1.030)
pH: 6 (ref 5.0–8.0)

## 2024-05-12 LAB — URINALYSIS, MICROSCOPIC (REFLEX): Bacteria, UA: NONE SEEN

## 2024-05-12 LAB — CBC
HCT: 41.1 % (ref 36.0–46.0)
Hemoglobin: 13.2 g/dL (ref 12.0–15.0)
MCH: 26.3 pg (ref 26.0–34.0)
MCHC: 32.1 g/dL (ref 30.0–36.0)
MCV: 82 fL (ref 80.0–100.0)
Platelets: 551 K/uL — ABNORMAL HIGH (ref 150–400)
RBC: 5.01 MIL/uL (ref 3.87–5.11)
RDW: 15.2 % (ref 11.5–15.5)
WBC: 15.6 K/uL — ABNORMAL HIGH (ref 4.0–10.5)
nRBC: 0 % (ref 0.0–0.2)

## 2024-05-12 LAB — TROPONIN I (HIGH SENSITIVITY)
Troponin I (High Sensitivity): 10 ng/L (ref <18)
Troponin I (High Sensitivity): 10 ng/L (ref <18)

## 2024-05-12 MED ORDER — POTASSIUM CHLORIDE CRYS ER 20 MEQ PO TBCR
40.0000 meq | EXTENDED_RELEASE_TABLET | Freq: Once | ORAL | Status: AC
  Administered 2024-05-12: 40 meq via ORAL
  Filled 2024-05-12: qty 2

## 2024-05-12 NOTE — Telephone Encounter (Signed)
 Call returned to [facility] and left a detailed voicemail authorizing verbal orders  per [employer] standing order

## 2024-05-12 NOTE — ED Triage Notes (Signed)
 C/O urinary retention since last night. C/O abd pain/shob. Axox4.

## 2024-05-12 NOTE — Telephone Encounter (Signed)
 Incoming call received from Hawi with [facility] stating patient is having difficulty voiding, nausea, and weakness (not new). Vertell requested an in and out, and urinalysis collection to avoid sending patient back to the hospital, as she was discharged on 05/06/24.   Vertell also indicated patient without shortness of breath or swelling.    I called Medina-Vargas, Monina C, NP on her cell phone and she authorized verbal order request. Call resumed with Vertell Vertell also asked that we connect with patient.  I called patient and informed her that if symptoms progress she will need to call for an appointment. Hailey [name] asked that I also share this information with Raguel (caregiver that helps around the house). Amelia informed as well.

## 2024-05-12 NOTE — Telephone Encounter (Signed)
 FYI Only or Action Required?: FYI only for provider: ED advised.  Patient was last seen in primary care on 04/23/2024 by Medina-Vargas, Jereld BROCKS, NP.  Called Nurse Triage reporting Pain.  Symptoms began yesterday.  Interventions attempted: OTC medications: Tylenol  and Prescription medications: Muscle relaxer(tizanidine ).  Symptoms are: gradually worsening.  Triage Disposition: Go to ED Now (or PCP Triage)  Patient/caregiver understands and will follow disposition?: Yes   Reason for Disposition  [1] Drinking very little AND [2] dehydration suspected (e.g., no urine > 12 hours, very dry mouth, very lightheaded)  Answer Assessment - Initial Assessment Questions Patient calls in stating that she's having trouble urinating, but states that her primary problem right now is pain. She's having 8/10 pain in arms, legs, lower back, and head. She's been taking Tylenol  and a muscle relaxer for the pain but states they've only helped some with the pain.  She mentions that she was admitted to the hospital on 11/28 to have fluid removed from her lungs. Since then she has had a chest heaviness and soreness over her right chest. She was discharged from the hospital on 12/4. She also mentions that she has not been able to eat or drink much d/t nausea. She reports a headache and lightheadedness. She states that she has not urinated since 10PM last night. Patient states that someone is supposed to come out to her house to I/O cath her, but advised to go to ED now.   1. ONSET: When did the muscle aches or body pains start?      Since they told me I have an immune disease  2. LOCATION: What part of your body is hurting? (e.g., entire body, arms, legs)      Arms and legs, lower back, head  3. SEVERITY: How bad is the pain? (Scale 1-10; or mild, moderate, severe)     8/10 constant pain  4. CAUSE: What do you think is causing the pains?     Immune disease  5. FEVER: Do you have a fever? If  Yes, ask: What is your temperature, how was it measured, and  when did it start?      No  6. OTHER SYMPTOMS: Do you have any other symptoms? (e.g., chest pain, cold or flu symptoms, rash, weakness, weight loss)     Weakness and weight loss, been going on for a while  7. PREGNANCY: Is there any chance you are pregnant? When was your last menstrual period?     NA  8. TRAVEL: Have you traveled out of the country in the last month? (e.g., exposures, travel history)     No  Protocols used: Muscle Aches and Body Pain-A-AH  Copied from CRM #[id number]. Topic: Clinical - Medical Advice >> May 12, 2024 11:57 AM DeAngela L wrote: Pt num [phone]   The OT called for the patient but she is gone now

## 2024-05-12 NOTE — Telephone Encounter (Signed)
 Copied from CRM #[id number]. Topic: Clinical - Home Health Verbal Orders >> May 12, 2024 12:01 PM Mercer PEDLAR wrote: Caller/Agency: Zacharia OT with [facility] Callback Number: [phone] Service Requested: Occupational Therapy Frequency: 1 week 8 Any new concerns about the patient? Yes Patient's weight: 05/12/24: 95.6 lbs 05/11/24: 94.6 lbs

## 2024-05-12 NOTE — Telephone Encounter (Signed)
 Noted

## 2024-05-12 NOTE — ED Provider Triage Note (Signed)
 Emergency Medicine Provider Triage Evaluation Note  Hailey [name] , a 78 y.o. female  was evaluated in triage.  Pt complains of urinary retention since last night.  Additionally complaining of chest heaviness with shortness of breath.  Had recent admission for urinary retention, had thoracentesis and cath.  Review of Systems  Positive:  Negative:   Physical Exam  BP 136/63   Pulse 87   Temp 97.9 F (36.6 C)   Resp 18   Ht 4' 11 (1.499 m)   Wt 42.2 kg   SpO2 96%   BMI 18.78 kg/m  Gen:   Awake, no distress   Resp:  Normal effort  MSK:   Moves extremities without difficulty  Other:  Generalized abdominal tenderness  Medical Decision Making  Medically screening exam initiated at 3:28 PM.  Appropriate orders placed.  Hailey [name] was informed that the remainder of the evaluation will be completed by another provider, this initial triage assessment does not replace that evaluation, and the importance of remaining in the ED until their evaluation is complete.     Hailey Lynwood VEAR, PA-C 05/12/24 1529

## 2024-05-12 NOTE — ED Provider Notes (Signed)
[place] [facility]MENT AT [facility] Provider Note   CSN: [id number] Arrival date & time: 05/12/24  1402     Patient presents with: Urinary Retention   Hailey [name] is a 78 y.o. female.   78 year old with history of CAD, A-fib tachybradycardia syndrome status post pacemaker, CHF, hypertension, type 2 diabetes, hyperthyroidism, sleep apnea, pulmonary hypertension presenting to the ED with urinary retention and SOB.  Patient states that she was recently admitted to the hospital for urinary retention and shortness of breath and was found to have a right sided pleural effusion.  She states that she did require multiple thoracenteses and patient and diuresis.  She states that she did have In-N-Out catheters done for her in the hospital and they thought her retention might be related to her constipation and was started on a bowel regimen.  The patient states that since being home she has continued to have issues with urination and has worsening shortness of breath.  She states that she is having pain across the right side of her chest.  She denies any fever or cough or lower extremity swelling.  She states that she urinated last night before bed but had not been able to urinate since then.  Of note she also reports that she is having diffuse body pain in her wrists and arms and legs related to her lupus.  She is currently on a steroid taper.  The history is provided by the patient.       Prior to Admission medications   Medication Sig Start Date End Date Taking? Authorizing Provider  acetaminophen  (TYLENOL ) 500 MG tablet Take 500-1,000 mg by mouth every 6 (six) hours as needed (pain.).    [provider]  albuterol  (VENTOLIN  HFA) 108 (90 Base) MCG/ACT inhaler Inhale 2 puffs into the lungs every 6 (six) hours as needed for wheezing or shortness of breath. 04/25/24   Medina-Vargas, Monina C, NP  Cholecalciferol  (VITAMIN D3) 1000 UNITS CAPS Take 1,000 Units by mouth in  the morning.    [provider]  ELIQUIS  5 MG TABS tablet TAKE ONE TABLET BY MOUTH TWICE DAILY 12/31/23   Croitoru, Mihai, MD  fluticasone  (FLONASE ) 50 MCG/ACT nasal spray Place 2 sprays into both nostrils daily as needed for allergies or rhinitis. 04/23/24   Medina-Vargas, Monina C, NP  furosemide  (LASIX ) 40 MG tablet Take 1 tablet (40 mg total) by mouth 2 (two) times daily. 05/06/24 06/05/24  Raenelle Coria, MD  loperamide  (IMODIUM  A-D) 2 MG tablet Take 2 mg by mouth. Patient not taking: Reported on 05/07/2024 02/20/17   [provider]  loratadine  (CLARITIN ) 10 MG tablet Take 1 tablet (10 mg total) by mouth daily. 04/23/24   Medina-Vargas, Monina C, NP  methimazole  (TAPAZOLE ) 10 MG tablet Take 20 mg (two tablets) daily. 12/31/23   [provider]  ondansetron  (ZOFRAN ) 4 MG tablet TAKE ONE TABLET BY MOUTH EVERY EIGHT HOURS AS NEEDED FOR NAUSEA OR VOMITING 05/10/24   Medina-Vargas, Monina C, NP  pantoprazole  (PROTONIX ) 40 MG tablet TAKE ONE TABLET BY MOUTH TWICE DAILY BEFORE A MEAL 12/02/23   Rudy Neptune S, PA-C  polyethylene glycol powder (GLYCOLAX /MIRALAX ) 17 GM/SCOOP powder Take 17 g by mouth 2 (two) times daily. Dissolve 1 capful (17g) in 4-8 ounces of liquid and take by mouth daily. 05/06/24   Ghimire, Kuber, MD  predniSONE  (DELTASONE ) 10 MG tablet Take 2 tablets (20 mg total) by mouth daily with breakfast for 3 days, THEN 1 tablet (10 mg  total) daily with breakfast for 3 days. 05/07/24 05/13/24  Raenelle Coria, MD  senna-docusate (SENOKOT-S) 8.6-50 MG tablet Take 2 tablets by mouth 2 (two) times daily. 05/06/24   Raenelle Coria, MD  sildenafil  (REVATIO ) 20 MG tablet Take 2 tablets (40 mg total) by mouth 3 (three) times daily. 05/06/24   Raenelle Coria, MD  sodium chloride  (NASAL MOISTURIZING SPRAY) 0.65 % nasal spray Place 1 spray into the nose as needed for congestion.    [provider]  tamsulosin  (FLOMAX ) 0.4 MG CAPS capsule Take 1 capsule (0.4 mg total) by mouth  daily. 05/07/24   Raenelle Coria, MD  tizanidine  (ZANAFLEX ) 2 MG capsule Take 2 mg by mouth at bedtime.    [provider]  traMADol  (ULTRAM ) 50 MG tablet Take 50-100 mg by mouth every 8 (eight) hours as needed for moderate pain (pain score 4-6).    [provider]    Allergies: Codeine, Ketorolac , Meperidine , Multaq  [dronedarone ], Xarelto [rivaroxaban], Flexeril  [cyclobenzaprine ], and Latex    Review of Systems  Updated Vital Signs BP 138/60   Pulse 82   Temp 98.4 F (36.9 C) (Oral)   Resp (!) 23   Ht 4' 11 (1.499 m)   Wt 42.2 kg   SpO2 97%   BMI 18.78 kg/m   Physical Exam Vitals and nursing note reviewed.  Constitutional:      General: She is not in acute distress.    Appearance: Normal appearance. She is ill-appearing (chronically).  HENT:     Head: Normocephalic.     Nose: Nose normal.  Eyes:     Extraocular Movements: Extraocular movements intact.  Cardiovascular:     Rate and Rhythm: Normal rate and regular rhythm.     Heart sounds: Normal heart sounds.  Pulmonary:     Effort: Pulmonary effort is normal.     Comments: Decreased breath sounds at R lung base Abdominal:     General: Abdomen is flat.     Palpations: Abdomen is soft.     Tenderness: There is no abdominal tenderness.  Genitourinary:    Comments: Foley catheter in place draining yellow urine Musculoskeletal:        General: Normal range of motion.     Cervical back: Normal range of motion.     Right lower leg: No edema.     Left lower leg: No edema.  Skin:    General: Skin is warm and dry.  Neurological:     Mental Status: She is alert and oriented to person, place, and time.  Psychiatric:        Mood and Affect: Mood normal.        Behavior: Behavior normal.     (all labs ordered are listed, but only abnormal results are displayed) Labs Reviewed  CBC - Abnormal; Notable for the following components:      Result Value   WBC 15.6 (*)    Platelets 551 (*)    All other  components within normal limits  COMPREHENSIVE METABOLIC PANEL WITH GFR - Abnormal; Notable for the following components:   Sodium 126 (*)    Potassium 3.3 (*)    Chloride 84 (*)    Glucose, Bld 192 (*)    Calcium  8.3 (*)    Albumin  3.2 (*)    All other components within normal limits  URINALYSIS, ROUTINE W REFLEX MICROSCOPIC - Abnormal; Notable for the following components:   Specific Gravity, Urine <1.005 (*)    Hgb urine dipstick TRACE (*)  All other components within normal limits  URINE CULTURE  URINALYSIS, MICROSCOPIC (REFLEX)  TROPONIN I (HIGH SENSITIVITY)  TROPONIN I (HIGH SENSITIVITY)    EKG: EKG Interpretation Date/Time:  Wednesday May 12 2024 15:40:09 EST Ventricular Rate:  78 PR Interval:    QRS Duration:  76 QT Interval:  410 QTC Calculation: 467 R Axis:   68  Text Interpretation: Atrial fibrillation Nonspecific ST abnormality Abnormal ECG No significant change since last tracing Confirmed by Ellouise Fine ([id number]) on 05/12/2024 9:45:21 PM  Radiology: [facility] Chest 2 View Result Date: 05/12/2024 CLINICAL DATA:  Urinary retention, abdominal pain, short of breath EXAM: CHEST - 2 VIEW COMPARISON:  05/03/2024 FINDINGS: Frontal and lateral views of the chest demonstrate stable multi lead pacemaker. The cardiac silhouette is unremarkable. There is increasing opacity at the right lung base, with worsening consolidation and right pleural effusion. No pneumothorax. Left chest is clear. IMPRESSION: 1. Progressive right basilar consolidation and enlarging right pleural effusion. Electronically Signed   By: Ozell Daring M.D.   On: 05/12/2024 17:29     Procedures   Medications Ordered in the ED  potassium chloride  SA (KLOR-CON  M) CR tablet 40 mEq (40 mEq Oral Given 05/12/24 2151)                                    Medical Decision Making This patient presents to the ED with chief complaint(s) of urinary retention, SOB with pertinent past medical history of  CAD,  A-fib tachybradycardia syndrome status post pacemaker, CHF, hypertension, type 2 diabetes, hyperthyroidism, sleep apnea, pulmonary hypertension which further complicates the presenting complaint. The complaint involves an extensive differential diagnosis and also carries with it a high risk of complications and morbidity.    The differential diagnosis includes urinary retention, UTI, dehydration, electrolyte abnormality, ACS, arrhythmia, pneumothorax, pulmonary edema, pleural effusion  Additional history obtained: Additional history obtained from N/A Records reviewed previous admission documents  ED Course and Reassessment: On patient's arrival she is hemodynamically stable in no acute distress.  She was initially evaluated in triage and had EKG, labs and chest x-ray performed and also had a Foley catheter placed.  She had approximately 700 cc of urine returned after catheter placement.  The patient's EKG showed rate controlled A-fib without acute ischemic changes.  Patient's labs showed mildly worsening hyponatremia and hypokalemia and potassium will be repleted.  Urine was negative for UTI.  She does have a leukocytosis but suspect this is likely due to her steroid use.  She does have significant worsening of her right sided pleural effusion.  The patient's worsening shortness of breath, she was recommended admission for thoracentesis.  Independent labs interpretation:  The following labs were independently interpreted: Leukocytosis likely secondary to steroid use, mildly worsening hyponatremia and hypokalemia  Independent visualization of imaging: - I independently visualized the following imaging with scope of interpretation limited to determining acute life threatening conditions related to emergency care: Chest x-ray, which revealed large right sided pleural effusion  Consultation: - Consulted or discussed management/test interpretation w/ external professional: Hospitalist  Consideration  for admission or further workup: Patient requires admission for pleural effusion Social Determinants of health: N/A    Risk Prescription drug management.       Final diagnoses:  Pleural effusion  Urinary retention    ED Discharge Orders     None          Kingsley, Rachna Schonberger K, DO 05/12/24  2240 ° °

## 2024-05-13 ENCOUNTER — Inpatient Hospital Stay (HOSPITAL_COMMUNITY)

## 2024-05-13 DIAGNOSIS — R531 Weakness: Secondary | ICD-10-CM | POA: Diagnosis not present

## 2024-05-13 DIAGNOSIS — E041 Nontoxic single thyroid nodule: Secondary | ICD-10-CM | POA: Diagnosis not present

## 2024-05-13 DIAGNOSIS — I272 Pulmonary hypertension, unspecified: Secondary | ICD-10-CM

## 2024-05-13 DIAGNOSIS — E890 Postprocedural hypothyroidism: Secondary | ICD-10-CM | POA: Diagnosis present

## 2024-05-13 DIAGNOSIS — Z7901 Long term (current) use of anticoagulants: Secondary | ICD-10-CM | POA: Diagnosis not present

## 2024-05-13 DIAGNOSIS — J9819 Other pulmonary collapse: Secondary | ICD-10-CM | POA: Diagnosis not present

## 2024-05-13 DIAGNOSIS — R339 Retention of urine, unspecified: Secondary | ICD-10-CM | POA: Diagnosis present

## 2024-05-13 DIAGNOSIS — I771 Stricture of artery: Secondary | ICD-10-CM | POA: Diagnosis not present

## 2024-05-13 DIAGNOSIS — Z4682 Encounter for fitting and adjustment of non-vascular catheter: Secondary | ICD-10-CM | POA: Diagnosis not present

## 2024-05-13 DIAGNOSIS — I4891 Unspecified atrial fibrillation: Secondary | ICD-10-CM

## 2024-05-13 DIAGNOSIS — I4821 Permanent atrial fibrillation: Secondary | ICD-10-CM | POA: Diagnosis present

## 2024-05-13 DIAGNOSIS — E039 Hypothyroidism, unspecified: Secondary | ICD-10-CM | POA: Diagnosis not present

## 2024-05-13 DIAGNOSIS — E861 Hypovolemia: Secondary | ICD-10-CM | POA: Diagnosis present

## 2024-05-13 DIAGNOSIS — E049 Nontoxic goiter, unspecified: Secondary | ICD-10-CM | POA: Diagnosis not present

## 2024-05-13 DIAGNOSIS — R918 Other nonspecific abnormal finding of lung field: Secondary | ICD-10-CM | POA: Diagnosis present

## 2024-05-13 DIAGNOSIS — E785 Hyperlipidemia, unspecified: Secondary | ICD-10-CM | POA: Diagnosis present

## 2024-05-13 DIAGNOSIS — I2510 Atherosclerotic heart disease of native coronary artery without angina pectoris: Secondary | ICD-10-CM | POA: Diagnosis not present

## 2024-05-13 DIAGNOSIS — I495 Sick sinus syndrome: Secondary | ICD-10-CM | POA: Diagnosis not present

## 2024-05-13 DIAGNOSIS — Z950 Presence of cardiac pacemaker: Secondary | ICD-10-CM | POA: Diagnosis not present

## 2024-05-13 DIAGNOSIS — I2721 Secondary pulmonary arterial hypertension: Secondary | ICD-10-CM | POA: Diagnosis present

## 2024-05-13 DIAGNOSIS — I2722 Pulmonary hypertension due to left heart disease: Secondary | ICD-10-CM | POA: Diagnosis not present

## 2024-05-13 DIAGNOSIS — I700 Atherosclerosis of aorta: Secondary | ICD-10-CM | POA: Diagnosis not present

## 2024-05-13 DIAGNOSIS — K219 Gastro-esophageal reflux disease without esophagitis: Secondary | ICD-10-CM | POA: Diagnosis present

## 2024-05-13 DIAGNOSIS — R64 Cachexia: Secondary | ICD-10-CM | POA: Diagnosis present

## 2024-05-13 DIAGNOSIS — R590 Localized enlarged lymph nodes: Secondary | ICD-10-CM | POA: Diagnosis not present

## 2024-05-13 DIAGNOSIS — E871 Hypo-osmolality and hyponatremia: Secondary | ICD-10-CM | POA: Diagnosis present

## 2024-05-13 DIAGNOSIS — R130 Aphagia: Secondary | ICD-10-CM | POA: Diagnosis not present

## 2024-05-13 DIAGNOSIS — I5033 Acute on chronic diastolic (congestive) heart failure: Secondary | ICD-10-CM

## 2024-05-13 DIAGNOSIS — J9811 Atelectasis: Secondary | ICD-10-CM | POA: Diagnosis present

## 2024-05-13 DIAGNOSIS — E0590 Thyrotoxicosis, unspecified without thyrotoxic crisis or storm: Secondary | ICD-10-CM | POA: Diagnosis present

## 2024-05-13 DIAGNOSIS — I5032 Chronic diastolic (congestive) heart failure: Secondary | ICD-10-CM | POA: Diagnosis present

## 2024-05-13 DIAGNOSIS — R760 Raised antibody titer: Secondary | ICD-10-CM | POA: Diagnosis not present

## 2024-05-13 DIAGNOSIS — E119 Type 2 diabetes mellitus without complications: Secondary | ICD-10-CM | POA: Diagnosis present

## 2024-05-13 DIAGNOSIS — I110 Hypertensive heart disease with heart failure: Secondary | ICD-10-CM | POA: Diagnosis not present

## 2024-05-13 DIAGNOSIS — R1310 Dysphagia, unspecified: Secondary | ICD-10-CM | POA: Diagnosis present

## 2024-05-13 DIAGNOSIS — Z681 Body mass index (BMI) 19 or less, adult: Secondary | ICD-10-CM | POA: Diagnosis not present

## 2024-05-13 DIAGNOSIS — J90 Pleural effusion, not elsewhere classified: Secondary | ICD-10-CM | POA: Diagnosis present

## 2024-05-13 DIAGNOSIS — E1159 Type 2 diabetes mellitus with other circulatory complications: Secondary | ICD-10-CM | POA: Diagnosis not present

## 2024-05-13 DIAGNOSIS — Z9889 Other specified postprocedural states: Secondary | ICD-10-CM | POA: Diagnosis not present

## 2024-05-13 DIAGNOSIS — R5381 Other malaise: Secondary | ICD-10-CM

## 2024-05-13 DIAGNOSIS — E876 Hypokalemia: Secondary | ICD-10-CM | POA: Diagnosis present

## 2024-05-13 DIAGNOSIS — G4730 Sleep apnea, unspecified: Secondary | ICD-10-CM | POA: Diagnosis not present

## 2024-05-13 DIAGNOSIS — Z66 Do not resuscitate: Secondary | ICD-10-CM | POA: Diagnosis present

## 2024-05-13 HISTORY — PX: IR THORACENTESIS ASP PLEURAL SPACE W/IMG GUIDE: IMG5380

## 2024-05-13 LAB — BODY FLUID CELL COUNT WITH DIFFERENTIAL
Eos, Fluid: 0 %
Lymphs, Fluid: 11 %
Monocyte-Macrophage-Serous Fluid: 24 % — ABNORMAL LOW (ref 50–90)
Neutrophil Count, Fluid: 65 % — ABNORMAL HIGH (ref 0–25)
Total Nucleated Cell Count, Fluid: 322 uL (ref 0–1000)

## 2024-05-13 LAB — BASIC METABOLIC PANEL WITH GFR
Anion gap: 10 (ref 5–15)
BUN: 11 mg/dL (ref 8–23)
CO2: 32 mmol/L (ref 22–32)
Calcium: 8.4 mg/dL — ABNORMAL LOW (ref 8.9–10.3)
Chloride: 85 mmol/L — ABNORMAL LOW (ref 98–111)
Creatinine, Ser: 0.65 mg/dL (ref 0.44–1.00)
GFR, Estimated: >60 mL/min (ref >60)
Glucose, Bld: 106 mg/dL — ABNORMAL HIGH (ref 70–99)
Potassium: 3.7 mmol/L (ref 3.5–5.1)
Sodium: 127 mmol/L — ABNORMAL LOW (ref 135–145)

## 2024-05-13 LAB — CBC
HCT: 36.9 % (ref 36.0–46.0)
Hemoglobin: 11.8 g/dL — ABNORMAL LOW (ref 12.0–15.0)
MCH: 26.1 pg (ref 26.0–34.0)
MCHC: 32 g/dL (ref 30.0–36.0)
MCV: 81.6 fL (ref 80.0–100.0)
Platelets: 523 K/uL — ABNORMAL HIGH (ref 150–400)
RBC: 4.52 MIL/uL (ref 3.87–5.11)
RDW: 15.4 % (ref 11.5–15.5)
WBC: 12.8 K/uL — ABNORMAL HIGH (ref 4.0–10.5)
nRBC: 0 % (ref 0.0–0.2)

## 2024-05-13 LAB — OSMOLALITY, URINE: Osmolality, Ur: 347 mosm/kg (ref 300–900)

## 2024-05-13 LAB — SODIUM, URINE, RANDOM: Sodium, Ur: <30 mmol/L

## 2024-05-13 LAB — LACTATE DEHYDROGENASE, PLEURAL OR PERITONEAL FLUID: LD, Fluid: 74 U/L — ABNORMAL HIGH (ref 3–23)

## 2024-05-13 LAB — ALBUMIN, PLEURAL OR PERITONEAL FLUID: Albumin, Fluid: 2.2 g/dL

## 2024-05-13 LAB — PROTIME-INR
INR: 1.5 — ABNORMAL HIGH (ref 0.8–1.2)
Prothrombin Time: 19.2 s — ABNORMAL HIGH (ref 11.4–15.2)

## 2024-05-13 LAB — GLUCOSE, BODY FLUID OTHER: Glucose, Body Fluid Other: 124

## 2024-05-13 MED ORDER — TAMSULOSIN HCL 0.4 MG PO CAPS
0.4000 mg | ORAL_CAPSULE | Freq: Every day | ORAL | Status: DC
  Administered 2024-05-13 – 2024-05-14 (×2): 0.4 mg via ORAL
  Filled 2024-05-13 (×2): qty 1

## 2024-05-13 MED ORDER — SENNOSIDES-DOCUSATE SODIUM 8.6-50 MG PO TABS
1.0000 | ORAL_TABLET | Freq: Every day | ORAL | Status: DC
  Administered 2024-05-13 – 2024-05-20 (×5): 1 via ORAL
  Filled 2024-05-13 (×7): qty 1

## 2024-05-13 MED ORDER — TIZANIDINE HCL 2 MG PO TABS
2.0000 mg | ORAL_TABLET | Freq: Every day | ORAL | Status: DC
  Administered 2024-05-13 – 2024-05-23 (×11): 2 mg via ORAL
  Filled 2024-05-13 (×12): qty 1

## 2024-05-13 MED ORDER — ACETAMINOPHEN 325 MG PO TABS
650.0000 mg | ORAL_TABLET | Freq: Four times a day (QID) | ORAL | Status: DC | PRN
  Administered 2024-05-13 – 2024-05-18 (×7): 650 mg via ORAL
  Filled 2024-05-13 (×8): qty 2

## 2024-05-13 MED ORDER — LIDOCAINE-EPINEPHRINE 1 %-1:100000 IJ SOLN
INTRAMUSCULAR | Status: AC
  Filled 2024-05-13: qty 1

## 2024-05-13 MED ORDER — PANTOPRAZOLE SODIUM 40 MG PO TBEC
40.0000 mg | DELAYED_RELEASE_TABLET | Freq: Two times a day (BID) | ORAL | Status: DC
  Administered 2024-05-13 – 2024-05-24 (×23): 40 mg via ORAL
  Filled 2024-05-13 (×23): qty 1

## 2024-05-13 MED ORDER — LORATADINE 10 MG PO TABS
10.0000 mg | ORAL_TABLET | Freq: Every day | ORAL | Status: DC
  Administered 2024-05-13 – 2024-05-14 (×2): 10 mg via ORAL
  Filled 2024-05-13 (×2): qty 1

## 2024-05-13 MED ORDER — ALBUTEROL SULFATE (2.5 MG/3ML) 0.083% IN NEBU: 2.5000 mg | INHALATION_SOLUTION | Freq: Four times a day (QID) | RESPIRATORY_TRACT | Status: DC | PRN

## 2024-05-13 MED ORDER — LIDOCAINE-EPINEPHRINE 1 %-1:100000 IJ SOLN
20.0000 mL | Freq: Once | INTRAMUSCULAR | Status: AC
  Administered 2024-05-13: 10 mL via INTRADERMAL

## 2024-05-13 MED ORDER — ONDANSETRON HCL 4 MG PO TABS
4.0000 mg | ORAL_TABLET | Freq: Four times a day (QID) | ORAL | Status: DC | PRN
  Administered 2024-05-16 – 2024-05-23 (×6): 4 mg via ORAL
  Filled 2024-05-13 (×7): qty 1

## 2024-05-13 MED ORDER — TRAMADOL HCL 50 MG PO TABS
50.0000 mg | ORAL_TABLET | Freq: Three times a day (TID) | ORAL | Status: DC | PRN
  Administered 2024-05-13: 100 mg via ORAL
  Administered 2024-05-13 – 2024-05-14 (×2): 50 mg via ORAL
  Administered 2024-05-14: 100 mg via ORAL
  Administered 2024-05-15: 50 mg via ORAL
  Administered 2024-05-17: 20:00:00 100 mg via ORAL
  Administered 2024-05-18: 03:00:00 50 mg via ORAL
  Administered 2024-05-18 – 2024-05-20 (×4): 100 mg via ORAL
  Filled 2024-05-13 (×2): qty 1
  Filled 2024-05-13 (×2): qty 2
  Filled 2024-05-13: qty 1
  Filled 2024-05-13 (×6): qty 2

## 2024-05-13 MED ORDER — APIXABAN 5 MG PO TABS: 5.0000 mg | ORAL_TABLET | Freq: Two times a day (BID) | ORAL | Status: DC

## 2024-05-13 MED ORDER — FUROSEMIDE 40 MG PO TABS
40.0000 mg | ORAL_TABLET | Freq: Two times a day (BID) | ORAL | Status: DC
  Administered 2024-05-13 (×2): 40 mg via ORAL
  Filled 2024-05-13 (×2): qty 2
  Filled 2024-05-13: qty 1

## 2024-05-13 MED ORDER — POTASSIUM CHLORIDE CRYS ER 20 MEQ PO TBCR
40.0000 meq | EXTENDED_RELEASE_TABLET | Freq: Once | ORAL | Status: DC
  Filled 2024-05-13: qty 2

## 2024-05-13 MED ORDER — SALINE SPRAY 0.65 % NA SOLN: 1.0000 | NASAL | Status: DC | PRN

## 2024-05-13 MED ORDER — POTASSIUM CHLORIDE 20 MEQ PO PACK
60.0000 meq | PACK | Freq: Once | ORAL | Status: AC
  Administered 2024-05-13: 60 meq via ORAL
  Filled 2024-05-13: qty 3

## 2024-05-13 MED ORDER — SILDENAFIL CITRATE 20 MG PO TABS
40.0000 mg | ORAL_TABLET | Freq: Three times a day (TID) | ORAL | Status: DC
  Administered 2024-05-13 – 2024-05-24 (×33): 40 mg via ORAL
  Filled 2024-05-13 (×37): qty 2

## 2024-05-13 MED ORDER — IOHEXOL 350 MG/ML SOLN
75.0000 mL | Freq: Once | INTRAVENOUS | Status: AC | PRN
  Administered 2024-05-13: 75 mL via INTRAVENOUS

## 2024-05-13 MED ORDER — ACETAMINOPHEN 650 MG RE SUPP: 650.0000 mg | Freq: Four times a day (QID) | RECTAL | Status: DC | PRN

## 2024-05-13 MED ORDER — POLYETHYLENE GLYCOL 3350 17 G PO PACK
17.0000 g | PACK | Freq: Two times a day (BID) | ORAL | Status: DC
  Administered 2024-05-13 – 2024-05-23 (×13): 17 g via ORAL
  Filled 2024-05-13 (×19): qty 1

## 2024-05-13 MED ORDER — METHIMAZOLE 10 MG PO TABS
20.0000 mg | ORAL_TABLET | Freq: Every day | ORAL | Status: DC
  Administered 2024-05-13 – 2024-05-24 (×12): 20 mg via ORAL
  Filled 2024-05-13 (×10): qty 2
  Filled 2024-05-13: qty 4
  Filled 2024-05-13 (×2): qty 2

## 2024-05-13 MED ORDER — FLUTICASONE PROPIONATE 50 MCG/ACT NA SUSP: 2.0000 | Freq: Every day | NASAL | Status: DC | PRN

## 2024-05-13 MED ORDER — ONDANSETRON HCL 4 MG/2ML IJ SOLN
4.0000 mg | Freq: Four times a day (QID) | INTRAMUSCULAR | Status: DC | PRN
  Administered 2024-05-13 – 2024-05-24 (×6): 4 mg via INTRAVENOUS
  Filled 2024-05-13 (×6): qty 2

## 2024-05-13 NOTE — Consult Note (Addendum)
 NAME:  Hailey [name], MRN:  [id number], DOB:  1945/08/30, LOS: 0 ADMISSION DATE:  05/12/2024, CONSULTATION DATE:  05/13/2024 REFERRING MD:  Royal Sill, MD, CHIEF COMPLAINT:  recurrent pleural effusion   History of Present Illness:   Hailey [name] is a 78 y.o. woman with extensive past medical history type 2 diabetes, coronary artery disease, chronic diastolic heart failure, permanent atrial fibrillation status post pacemaker, hypothyroidism.  In November she was admitted with worsening shortness of breath and was found to have right pleural effusion for which she underwent thoracentesis with IR x 2.  This was exudative based on LDH.  She was diuresed with IV Lasix .  She also has secondary pulmonary hypertension and is on sildenafil  per Dr. Cherrie.  During this admission she had autoimmune testing done due to uncertainty about the nature of the effusion.  Her ANA was positive.  Complement levels were normal.  Her ESR and CRP were elevated.  She also had right heart catheterization this admission which showed normal left-sided lung pressures and cardiac output.  The plan had been to follow-up as an outpatient to discuss additional pulmonary vasodilators given her mild elevation in PVR despite sildenafil  therapy.  She was started on empiric steroid therapy with prednisone  and told to follow-up with rheumatology as an outpatient for ongoing management of the effusion.  He was recommended be discharged to SNF but she declined and went home.  She presents now again with worsening shortness of breath, abdominal distention, effusion.  Pulmonary being consulted for management of the right pleural effusion.  Of note note , MBS performed last admission- no aspiration.   She has not yet seen rheumatology.    Pertinent  Medical History    Significant Hospital Events: Including procedures, antibiotic start and stop dates in addition to other pertinent events     Interim History /  Subjective:    Objective    Blood pressure 122/64, pulse 73, temperature 98.8 F (37.1 C), temperature source Oral, resp. rate (!) 21, height 4' 11 (1.499 m), weight 42.2 kg, SpO2 95%.        Intake/Output Summary (Last 24 hours) at 05/13/2024 1143 Last data filed at 05/13/2024 0305 Gross per 24 hour  Intake --  Output 825 ml  Net -825 ml   Filed Weights   05/12/24 1503  Weight: 42.2 kg    Examination: General: thin frail elderly woman, on nasal cannula HENT: mmm Lungs: breath sounds diminished right lung base Cardiovascular: regular in the 80s, tele in atrial flutter Abdomen: soft, nontender Extremities: thin no edema Neuro: normal speech no focal asymmetry  Labs reveiewed Na 127 K 0.65 BNP 2352 Troponin negative  Previously pleural fluid studies May 03 2024 LDH 140 Cell count 5100 TNC 80% neutrophil, although there was fibrin clumping Before that 7300, monocyte predominant Chest xray post thoracentesis shows reduced right pleural effusion, no ptx    Resolved problem list   Assessment and Plan   Recurrent right pleural effusion +ANA, elevated inflammatory markers Acute on chronic HFpEF Pulmonary hypertension Group 1-2 Atrial fibrillation s/p ppm  Differential diagnosis for the effusion is possible autoimmune mediated from RA vs pseudoexudative from chronic chf.   I reviewed his pleural fluid study cell count today. I have added on some additional studies - LDH, pleural glucose, protein, cytology, RF (which may be a send out.) will add on serum rf and ccp for completeness. Her dsdna was notably negative making SLE less likely. This would be an unusual presentation  for autoimmune disorder, especially due to age of presentation and no family history.   I think this is most likely pseudoexudative. LDH from today's pleural fluid was much lower. RPE is usually high exudative and proteinaceous with low glucose.   Will follow CT results.   Verdon Gore,  MD Pulmonary and Critical Care Medicine [facility] 05/13/2024 1:14 PM Pager: see AMION  If no response to pager, please call critical care on call (see AMION) until 7pm After 7:00 pm call Elink      Labs   CBC: Recent Labs  Lab 05/12/24 1558 05/13/24 0534  WBC 15.6* 12.8*  HGB 13.2 11.8*  HCT 41.1 36.9  MCV 82.0 81.6  PLT 551* 523*    Basic Metabolic Panel: Recent Labs  Lab 05/12/24 1558 05/13/24 0534  NA 126* 127*  K 3.3* 3.7  CL 84* 85*  CO2 30 32  GLUCOSE 192* 106*  BUN 12 11  CREATININE 0.68 0.65  CALCIUM  8.3* 8.4*   GFR: Estimated Creatinine Clearance: 38 mL/min (by C-G formula based on SCr of 0.65 mg/dL). Recent Labs  Lab 05/12/24 1558 05/13/24 0534  WBC 15.6* 12.8*    Liver Function Tests: Recent Labs  Lab 05/12/24 1558  AST 16  ALT 16  ALKPHOS 118  BILITOT 1.0  PROT 6.6  ALBUMIN  3.2*   No results for input(s): LIPASE, AMYLASE in the last 168 hours. No results for input(s): AMMONIA in the last 168 hours.  ABG    Component Value Date/Time   PHART 7.415 (H) 11/25/2010 1435   PCO2ART 38.8 11/25/2010 1435   PO2ART 67.4 (L) 11/25/2010 1435   HCO3 33.7 (H) 05/03/2024 1154   TCO2 35 (H) 05/03/2024 1154   O2SAT 75 05/03/2024 1154     Coagulation Profile: Recent Labs  Lab 05/13/24 0534  INR 1.5*    Cardiac Enzymes: No results for input(s): CKTOTAL, CKMB, CKMBINDEX, TROPONINI in the last 168 hours.  HbA1C: Hgb A1c MFr Bld  Date/Time Value Ref Range Status  05/01/2024 09:35 AM 6.5 (H) 4.8 - 5.6 % Final    Comment:    (NOTE)         Prediabetes: 5.7 - 6.4         Diabetes: >6.4         Glycemic control for adults with diabetes: <7.0   05/13/2023 02:18 PM 7.3 (H) <5.7 % of total Hgb Final    Comment:    For someone without known diabetes, a hemoglobin A1c value of 6.5% or greater indicates that they may have  diabetes and this should be confirmed with a follow-up  test. . For someone with known  diabetes, a value <7% indicates  that their diabetes is well controlled and a value  greater than or equal to 7% indicates suboptimal  control. A1c targets should be individualized based on  duration of diabetes, age, comorbid conditions, and  other considerations. . Currently, no consensus exists regarding use of hemoglobin A1c for diagnosis of diabetes for children. .     CBG: No results for input(s): GLUCAP in the last 168 hours.  Review of Systems:   Negative except as above in HPI  Past Medical History:  She,  has a past medical history of Acute on chronic diastolic CHF (congestive heart failure), NYHA class 1 (HCC) (04/29/2013), Allergic rhinitis, Brady-tachy syndrome (HCC), CAD (coronary artery disease), Control of atrial fibrillation with pacemaker (HCC), Diabetes mellitus (HCC), DJD (degenerative joint disease), Dyspnea, Hearing loss, Hypertension, Hyperthyroidism, Paroxysmal atrial  fibrillation (HCC), Pneumonia, Polio, PONV (postoperative nausea and vomiting), Presence of permanent cardiac pacemaker, Pulmonary disease, and Sleep apnea.   Surgical History:   Past Surgical History:  Procedure Laterality Date   arm surgery d/t fx Right 1996   ATRIAL FIBRILLATION ABLATION  07/31/2012; 08-10-13   PVI by Dr Kelsie   ATRIAL FIBRILLATION ABLATION N/A 07/31/2012   Procedure: ATRIAL FIBRILLATION ABLATION;  Surgeon: Lynwood Kelsie, MD;  Location: [facility] CATH LAB;  Service: Cardiovascular;  Laterality: N/A;   ATRIAL FIBRILLATION ABLATION N/A 08/10/2013   Procedure: ATRIAL FIBRILLATION ABLATION;  Surgeon: Lynwood JONETTA Kelsie, MD;  Location: [facility] CATH LAB;  Service: Cardiovascular;  Laterality: N/A;   BACK SURGERY  07/26/2012   dR. nUDELMAN   BIOPSY  06/30/2023   Procedure: BIOPSY;  Surgeon: Cindie Carlin POUR, DO;  Location: [facility] ENDO SUITE;  Service: Endoscopy;;   CARDIAC CATHETERIZATION  12/03/2010   single vessel mid RCA   CARDIAC ELECTROPHYSIOLOGY MAPPING AND ABLATION  07/31/2012   Dr. Lynwood Kelsie   CESAREAN SECTION  09/20/1974   Dr. Lonni   CHOLECYSTECTOMY  1994   COLONOSCOPY WITH PROPOFOL  N/A 03/21/2015   Procedure: COLONOSCOPY WITH PROPOFOL ;  Surgeon: Renaye Sous, MD;  Location: [facility] ENDOSCOPY;  Service: Endoscopy;  Laterality: N/A;   COLONOSCOPY WITH PROPOFOL  N/A 06/30/2023   Procedure: COLONOSCOPY WITH PROPOFOL ;  Surgeon: Cindie Carlin POUR, DO;  Location: [facility] ENDO SU[facility];  Service: Endoscopy;  Laterality: N/A;  11:15 AM, ASA 4   DIAGNOSTIC MAMMOGRAM  04/16/2017   Duwaine HERO. Morris, DO   ESOPHAGOGASTRODUODENOSCOPY (EGD) WITH PROPOFOL  N/A 06/30/2023   Procedure: ESOPHAGOGASTRODUODENOSCOPY (EGD) WITH PROPOFOL ;  Surgeon: Cindie Carlin POUR, DO;  Location: [facility] END[facility];  Service: Endoscopy;  Laterality: N/A;  11:15 AM, ASA 4   EYE SURGERY Bilateral    remove cataracts   IR THORACENTESIS ASP PLEURAL SPACE W/IMG GUIDE  04/30/2024   IR THORACENTESIS ASP PLEURAL SPACE W/IMG GUIDE  05/03/2024   LAPAROSCOPIC HYSTERECTOMY  Late 70s to early 80s   LEFT HEART CATH AND CORONARY ANGIOGRAPHY N/A 07/30/2017   Procedure: LEFT HEART CATH AND CORONARY ANGIOGRAPHY;  Surgeon: Burnard Debby LABOR, MD;  Location: [facility] INVASIVE CV LAB;  Service: Cardiovascular;  Laterality: N/A;   LUMBAR LAMINECTOMY/DECOMPRESSION MICRODISCECTOMY Right 12/23/2012   Procedure: Right L5-S1 Laminotomy for resection of synovial cyst;  Surgeon: Lamar LELON Peaches, MD;  Location: [facility] NEURO ORS;  Service: Neurosurgery;  Laterality: Right;  Right Lumbar five-sacral one Laminotomy for resection of synovial cyst   NM MYOCAR PERF WALL MOTION  12/20/2010   normal   PACEMAKER IMPLANT N/A 08/09/2019   Procedure: PACEMAKER IMPLANT;  Surgeon: Francyne Headland, MD;  Location: [facility] INVASIVE CV LAB;  Service: Cardiovascular;  Laterality: N/A;   PACEMAKER INSERTION  12/03/2010   SJM Accent DR RF implanted by Dr Francyne   POLYPECTOMY  06/30/2023   Procedure: POLYPECTOMY;  Surgeon: Cindie Carlin POUR, DO;  Location: [facility] END[facility];  Service: Endoscopy;;    RIGHT HEART CATH N/A 04/22/2018   Procedure: RIGHT HEART CATH;  Surgeon: Darron Deatrice LABOR, MD;  Location[facility] INVASIVE CV LAB;  Service: Cardiovascular;  Laterality: N/A;   RIGHT HEART CATH N/A 07/09/2022   Procedure: RIGHT HEART CATH;  Surgeon: Cherrie Toribio SAUNDERS, MD;  Location: [facility] INVASIVE CV LAB;  Service: Cardiovascular;  Laterality: N/A;   RIGHT HEART CATH N/A 05/03/2024   Procedure: RIGHT HEART CATH;  Surgeon: Cherrie Toribio SAUNDERS, MD;  Location: [facility] INVASIVE CV LAB;  Service: Cardiovascular;  Laterality: N/A;  TEE WITHOUT CARDIOVERSION N/A 07/30/2012   Procedure: TRANSESOPHAGEAL ECHOCARDIOGRAM (TEE);  Surgeon: Jerel Balding, MD;  Location: [facility] ENDOSCOPY;  Service: Cardiovascular;  Laterality: N/A;  h&p in file-hope   TEE WITHOUT CARDIOVERSION N/A 08/09/2013   Procedure: TRANSESOPHAGEAL ECHOCARDIOGRAM (TEE);  Surgeon: Redell GORMAN Shallow, MD;  Location: [facility] ENDOSCOPY;  Service: Cardiovascular;  Laterality: N/A;   THYROID  LOBECTOMY Right 09/20/2022   Procedure: RIGHT HEMITHYROIDECTOMY;  Surgeon: Llewellyn Gerard LABOR, DO;  Location: [facility] OR;  Service: ENT;  Laterality: Right;   TOTAL ABDOMINAL HYSTERECTOMY  1981   ULTRASOUND GUIDANCE FOR VASCULAR ACCESS  04/22/2018   Procedure: Ultrasound Guidance For Vascular Access;  Surgeon: Darron Deatrice LABOR, MD;  Location: [facility] INVASIVE CV LAB;  Service: Cardiovascular;;     Social History:   reports that she has never smoked. She has never used smokeless tobacco. She reports that she does not drink alcohol and does not use drugs.   Family History:  Her family history includes Breast cancer in her sister, sister, and sister; Cancer in her brother; Colon cancer in her sister; Dementia in her brother and sister; Diabetes in her sister; Heart attack in her sister; Heart disease in her sister and sister; Prostate cancer in her brother; Suicidality in her brother.   Allergies Allergies[1]   Home Medications  Prior to Admission medications  Medication Sig Start  Date End Date Taking? Authorizing Provider  tiZANidine  (ZANAFLEX ) 2 MG tablet Take 2 mg by mouth at bedtime. 05/12/24  Yes [provider]  acetaminophen  (TYLENOL ) 500 MG tablet Take 500-1,000 mg by mouth every 6 (six) hours as needed (pain.).    [provider]  albuterol  (VENTOLIN  HFA) 108 (90 Base) MCG/ACT inhaler Inhale 2 puffs into the lungs every 6 (six) hours as needed for wheezing or shortness of breath. 04/25/24   Medina-Vargas, Monina C, NP  Cholecalciferol  (VITAMIN D3) 1000 UNITS CAPS Take 1,000 Units by mouth in the morning.    [provider]  ELIQUIS  5 MG TABS tablet TAKE ONE TABLET BY MOUTH TWICE DAILY 12/31/23   Croitoru, Mihai, MD  fluticasone  (FLONASE ) 50 MCG/ACT nasal spray Place 2 sprays into both nostrils daily as needed for allergies or rhinitis. 04/23/24   Medina-Vargas, Monina C, NP  furosemide  (LASIX ) 40 MG tablet Take 1 tablet (40 mg total) by mouth 2 (two) times daily. 05/06/24 06/05/24  Raenelle Coria, MD  loperamide  (IMODIUM  A-D) 2 MG tablet Take 2 mg by mouth. Patient not taking: Reported on 05/07/2024 02/20/17   [provider]  loratadine  (CLARITIN ) 10 MG tablet Take 1 tablet (10 mg total) by mouth daily. 04/23/24   Medina-Vargas, Monina C, NP  methimazole  (TAPAZOLE ) 10 MG tablet Take 20 mg (two tablets) daily. 12/31/23   [provider]  ondansetron  (ZOFRAN ) 4 MG tablet TAKE ONE TABLET BY MOUTH EVERY EIGHT HOURS AS NEEDED FOR NAUSEA OR VOMITING 05/10/24   Medina-Vargas, Monina C, NP  pantoprazole  (PROTONIX ) 40 MG tablet TAKE ONE TABLET BY MOUTH TWICE DAILY BEFORE A MEAL 12/02/23   Rudy Neptune S, PA-C  polyethylene glycol powder (GLYCOLAX /MIRALAX ) 17 GM/SCOOP powder Take 17 g by mouth 2 (two) times daily. Dissolve 1 capful (17g) in 4-8 ounces of liquid and take by mouth daily. 05/06/24   Raenelle Coria, MD  predniSONE  (DELTASONE ) 10 MG tablet Take 2 tablets (20 mg total) by mouth daily with breakfast for 3 days, THEN 1 tablet (10 mg  total) daily with breakfast for 3 days. 05/07/24 05/13/24  Raenelle Coria, MD  senna-docusate (SENOKOT-S)  8.6-50 MG tablet Take 2 tablets by mouth 2 (two) times daily. 05/06/24   Ghimire, Kuber, MD  sildenafil  (REVATIO ) 20 MG tablet Take 2 tablets (40 mg total) by mouth 3 (three) times daily. 05/06/24   Raenelle Coria, MD  sodium chloride  (NASAL MOISTURIZING SPRAY) 0.65 % nasal spray Place 1 spray into the nose as needed for congestion.    [provider]  tamsulosin  (FLOMAX ) 0.4 MG CAPS capsule Take 1 capsule (0.4 mg total) by mouth daily. 05/07/24   Raenelle Coria, MD  tizanidine  (ZANAFLEX ) 2 MG capsule Take 2 mg by mouth at bedtime.    [provider]  traMADol  (ULTRAM ) 50 MG tablet Take 50-100 mg by mouth every 8 (eight) hours as needed for moderate pain (pain score 4-6).    [provider]            [1]  Allergies Allergen Reactions   Codeine Nausea And Vomiting and Nausea Only   Ketorolac  Nausea And Vomiting, Nausea Only and Other (See Comments)    pt told to never take this med   Meperidine  Nausea Only and Nausea And Vomiting   Multaq  [Dronedarone ] Other (See Comments)    Increased fluid retention   Xarelto [Rivaroxaban] Diarrhea   Flexeril  [Cyclobenzaprine ] Nausea And Vomiting   Latex Rash    Pt stated on 09/20/2022

## 2024-05-13 NOTE — H&P (Signed)
 History and Physical    Hailey [name] FMW:[id number] DOB: [birth year] DOA: 05/12/2024  PCP: [name], NP   Chief Complaint: sob  HPI: Hailey [name] is a 78 y.o. female with medical history significant of CHF, A-fib, CAD, hypertension, hyperlipidemia, type 2 diabetes who presented to the emergency department due to urinary retention and shortness of breath.  Patient was recently hospitalized for urinary tension shortness of breath found to have a right-sided pleural effusion.  She has required multiple thoracentesis in the past.  She is also required intermittent urinary catheterizations.  She presented to the ER where she was found to be afebrile and hemodynamically able.  LBA obtained on presentation which showed WBC 15.6, hemoglobin 13.2, platelets 551, troponin 10, sodium 126, potassium 3.3, creatinine 0.68, urinalysis negative for infection.  Chest x-ray was obtained which showed right sided consolidated pleural effusion.  Patient was admitted for further workup.  On admission she had decreased lung sounds on the right side she was found to have worsening hyponatremia, hypokalemia.  Her electrolytes were repleted and a thoracentesis was ordered.  She was only discharged on 12/4.  During this presentation she was found to have heart failure exacerbation and underwent 2 separate thoracentesis and was discharged with diuresis.  Her fluid was thought to be inflammatory in nature and she was placed on prednisone .  She was supposed to schedule outpatient follow-up with rheumatology.  Review of Systems: Review of Systems  Constitutional: Negative.   HENT: Negative.    Eyes: Negative.   Respiratory:  Positive for cough and shortness of breath.   Cardiovascular: Negative.   Gastrointestinal: Negative.   Genitourinary: Negative.   Musculoskeletal: Negative.   Skin: Negative.   Neurological: Negative.   Psychiatric/Behavioral: Negative.    All other systems reviewed and are  negative.    As per HPI otherwise 10 point review of systems negative.   Allergies  Allergen Reactions   Codeine Nausea And Vomiting and Nausea Only   Ketorolac  Nausea And Vomiting, Nausea Only and Other (See Comments)    pt told to never take this med   Meperidine  Nausea Only and Nausea And Vomiting   Multaq  [Dronedarone ] Other (See Comments)    Increased fluid retention   Xarelto [Rivaroxaban] Diarrhea   Flexeril  [Cyclobenzaprine ] Nausea And Vomiting   Latex Rash    Pt stated on 09/20/2022    Past Medical History:  Diagnosis Date   Acute on chronic diastolic CHF (congestive heart failure), NYHA class 1 (HCC) 04/29/2013   Allergic rhinitis    Brady-tachy syndrome (HCC)    s/p STJ dual chamber PPM by Dr Francyne   CAD (coronary artery disease)    70% RCA stenosis, treated medically   Control of atrial fibrillation with pacemaker (HCC)    Diabetes mellitus (HCC)    type 2   DJD (degenerative joint disease)    Dyspnea    occasional   Hearing loss    Right ear only - no hearing aids   Hypertension    Hyperthyroidism    Paroxysmal atrial fibrillation (HCC)    Pacemaker St Jude   Pneumonia    years ago- before 2012 per patient   Polio    as a child   PONV (postoperative nausea and vomiting)    Presence of permanent cardiac pacemaker    St Jude pacemaker   Pulmonary disease    Sleep apnea    uses  CPAP nightly    Past Surgical History:  Procedure Laterality  Date   arm surgery d/t fx Right 1996   ATRIAL FIBRILLATION ABLATION  07/31/2012; 08-10-13   PVI by Dr Kelsie   ATRIAL FIBRILLATION ABLATION N/A 07/31/2012   Procedure: ATRIAL FIBRILLATION ABLATION;  Surgeon: Lynwood Kelsie, MD;  Location: [facility] CATH LAB;  Service: Cardiovascular;  Laterality: N/A;   ATRIAL FIBRILLATION ABLATION N/A 08/10/2013   Procedure: ATRIAL FIBRILLATION ABLATION;  Surgeon: Lynwood JONETTA Kelsie, MD;  Location: [facility] CATH LAB;  Service: Cardiovascular;  Laterality: N/A;   BACK SURGERY  07/26/2012   dR.  nUDELMAN   BIOPSY  06/30/2023   Procedure: BIOPSY;  Surgeon: Cindie Carlin POUR, DO;  Location: [facility] ENDO SUITE;  Service: Endoscopy;;   CARDIAC CATHETERIZATION  12/03/2010   single vessel mid RCA   CARDIAC ELECTROPHYSIOLOGY MAPPING AND ABLATION  07/31/2012   Dr. Lynwood Kelsie   CESAREAN SECTION  09/20/1974   Dr. Lonni   CHOLECYSTECTOMY  1994   COLONOSCOPY WITH PROPOFOL  N/A 03/21/2015   Procedure: COLONOSCOPY WITH PROPOFOL ;  Surgeon: Renaye Sous, MD;  Location: [facility] ENDOSCOPY;  Service: Endoscopy;  Laterality: N/A;   COLONOSCOPY WITH PROPOFOL  N/A 06/30/2023   Procedure: COLONOSCOPY WITH PROPOFOL ;  Surgeon: Cindie Carlin POUR, DO;  Location: [facility] ENDO SUITE;  Service: Endoscopy;  Laterality: N/A;  11:15 AM, ASA 4   DIAGNOSTIC MAMMOGRAM  04/16/2017   Duwaine HERO. Morris, DO   ESOPHAGOGASTRODUODENOSCOPY (EGD) WITH PROPOFOL  N/A 06/30/2023   Procedure: ESOPHAGOGASTRODUODENOSCOPY (EGD) WITH PROPOFOL ;  Surgeon: Cindie Carlin POUR, DO;  Location: [facility] ENDO SUITE;  Service: Endoscopy;  Laterality: N/A;  11:15 AM, ASA 4   EYE SURGERY Bilateral    remove cataracts   IR THORACENTESIS ASP PLEURAL SPACE W/IMG GUIDE  04/30/2024   IR THORACENTESIS ASP PLEURAL SPACE W/IMG GUIDE  05/03/2024   LAPAROSCOPIC HYSTERECTOMY  Late 70s to early 80s   LEFT HEART CATH AND CORONARY ANGIOGRAPHY N/A 07/30/2017   Procedure: LEFT HEART CATH AND CORONARY ANGIOGRAPHY;  Surgeon: Burnard Debby LABOR, MD;  Location: [facility] INVASIVE CV LAB;  Service: Cardiovascular;  Laterality: N/A;   LUMBAR LAMINECTOMY/DECOMPRESSION MICRODISCECTOMY Right 12/23/2012   Procedure: Right L5-S1 Laminotomy for resection of synovial cyst;  Surgeon: Lamar LELON Peaches, MD;  Location: [facility] NEURO ORS;  Service: Neurosurgery;  Laterality: Right;  Right Lumbar five-sacral one Laminotomy for resection of synovial cyst   NM MYOCAR PERF WALL MOTION  12/20/2010   normal   PACEMAKER IMPLANT N/A 08/09/2019   Procedure: PACEMAKER IMPLANT;  Surgeon: Francyne Headland, MD;  Location: [facility]  INVASIVE CV LAB;  Service: Cardiovascular;  Laterality: N/A;   PACEMAKER INSERTION  12/03/2010   SJM Accent DR RF implanted by Dr Francyne   POLYPECTOMY  06/30/2023   Procedure: POLYPECTOMY;  Surgeon: Cindie Carlin POUR, DO;  Location: [facility] ENDO SUITE;  Service: Endoscopy;;   RIGHT HEART CATH N/A 04/22/2018   Procedure: RIGHT HEART CATH;  Surgeon: Darron Deatrice LABOR, MD;  Location: [facility] INVASIVE CV LAB;  Service: Cardiovascular;  Laterality: N/A;   RIGHT HEART CATH N/A 07/09/2022   Procedure: RIGHT HEART CATH;  Surgeon: Cherrie Toribio SAUNDERS, MD;  Location: [facility] INVASIVE CV LAB;  Service: Cardiovascular;  Laterality: N/A;   RIGHT HEART CATH N/A 05/03/2024   Procedure: RIGHT HEART CATH;  Surgeon: Cherrie Toribio SAUNDERS, MD;  Location: [facility] INVASIVE CV LAB;  Service: Cardiovascular;  Laterality: N/A;   TEE WITHOUT CARDIOVERSION N/A 07/30/2012   Procedure: TRANSESOPHAGEAL ECHOCARDIOGRAM (TEE);  Surgeon: Headland Francyne, MD;  Location: [facility] ENDOSCOPY;  Service: Cardiovascular;  Laterality: N/A;  h&p in file-hope  TEE WITHOUT CARDIOVERSION N/A 08/09/2013   Procedure: TRANSESOPHAGEAL ECHOCARDIOGRAM (TEE);  Surgeon: Redell GORMAN Shallow, MD;  Location: [facility] ENDOSCOPY;  Service: Cardiovascular;  Laterality: N/A;   THYROID  LOBECTOMY Right 09/20/2022   Procedure: RIGHT HEMITHYROIDECTOMY;  Surgeon: Llewellyn Gerard LABOR, DO;  Location: [facility] OR;  Service: ENT;  Laterality: Right;   TOTAL ABDOMINAL HYSTERECTOMY  1981   ULTRASOUND GUIDANCE FOR VASCULAR ACCESS  04/22/2018   Procedure: Ultrasound Guidance For Vascular Access;  Surgeon: Darron Deatrice LABOR, MD;  Location: [facility] INVASIVE CV LAB;  Service: Cardiovascular;;     reports that she has never smoked. She has never used smokeless tobacco. She reports that she does not drink alcohol and does not use drugs.  Family History  Problem Relation Age of Onset   Cancer Brother    Heart attack Sister    Breast cancer Sister    Diabetes Sister    Heart disease Sister    Breast cancer  Sister    Dementia Sister    Colon cancer Sister    Heart disease Sister    Breast cancer Sister    Suicidality Brother    Prostate cancer Brother    Dementia Brother     Prior to Admission medications   Medication Sig Start Date End Date Taking? Authorizing Provider  acetaminophen  (TYLENOL ) 500 MG tablet Take 500-1,000 mg by mouth every 6 (six) hours as needed (pain.).    [provider]  albuterol  (VENTOLIN  HFA) 108 (90 Base) MCG/ACT inhaler Inhale 2 puffs into the lungs every 6 (six) hours as needed for wheezing or shortness of breath. 04/25/24   Medina-Vargas, Monina C, NP  Cholecalciferol  (VITAMIN D3) 1000 UNITS CAPS Take 1,000 Units by mouth in the morning.    [provider]  ELIQUIS  5 MG TABS tablet TAKE ONE TABLET BY MOUTH TWICE DAILY 12/31/23   Croitoru, Mihai, MD  fluticasone  (FLONASE ) 50 MCG/ACT nasal spray Place 2 sprays into both nostrils daily as needed for allergies or rhinitis. 04/23/24   Medina-Vargas, Monina C, NP  furosemide  (LASIX ) 40 MG tablet Take 1 tablet (40 mg total) by mouth 2 (two) times daily. 05/06/24 06/05/24  Raenelle Coria, MD  loperamide  (IMODIUM  A-D) 2 MG tablet Take 2 mg by mouth. Patient not taking: Reported on 05/07/2024 02/20/17   [provider]  loratadine  (CLARITIN ) 10 MG tablet Take 1 tablet (10 mg total) by mouth daily. 04/23/24   Medina-Vargas, Monina C, NP  methimazole  (TAPAZOLE ) 10 MG tablet Take 20 mg (two tablets) daily. 12/31/23   [provider]  ondansetron  (ZOFRAN ) 4 MG tablet TAKE ONE TABLET BY MOUTH EVERY EIGHT HOURS AS NEEDED FOR NAUSEA OR VOMITING 05/10/24   Medina-Vargas, Monina C, NP  pantoprazole  (PROTONIX ) 40 MG tablet TAKE ONE TABLET BY MOUTH TWICE DAILY BEFORE A MEAL 12/02/23   Rudy Neptune S, PA-C  polyethylene glycol powder (GLYCOLAX /MIRALAX ) 17 GM/SCOOP powder Take 17 g by mouth 2 (two) times daily. Dissolve 1 capful (17g) in 4-8 ounces of liquid and take by mouth daily. 05/06/24   Ghimire, Kuber, MD   predniSONE  (DELTASONE ) 10 MG tablet Take 2 tablets (20 mg total) by mouth daily with breakfast for 3 days, THEN 1 tablet (10 mg total) daily with breakfast for 3 days. 05/07/24 05/13/24  Raenelle Coria, MD  senna-docusate (SENOKOT-S) 8.6-50 MG tablet Take 2 tablets by mouth 2 (two) times daily. 05/06/24   Raenelle Coria, MD  sildenafil  (REVATIO ) 20 MG tablet Take 2 tablets (40 mg total) by mouth 3 (three) times  daily. 05/06/24   Raenelle Coria, MD  sodium chloride  (NASAL MOISTURIZING SPRAY) 0.65 % nasal spray Place 1 spray into the nose as needed for congestion.    [provider]  tamsulosin  (FLOMAX ) 0.4 MG CAPS capsule Take 1 capsule (0.4 mg total) by mouth daily. 05/07/24   Raenelle Coria, MD  tizanidine  (ZANAFLEX ) 2 MG capsule Take 2 mg by mouth at bedtime.    [provider]  traMADol  (ULTRAM ) 50 MG tablet Take 50-100 mg by mouth every 8 (eight) hours as needed for moderate pain (pain score 4-6).    [provider]    Physical Exam: Vitals:   05/12/24 2151 05/12/24 2215 05/12/24 2230 05/13/24 0015  BP:  (!) 137/53 138/60 (!) 110/50  Pulse:  73 82 71  Resp:  (!) 23 (!) 23 17  Temp: 98.4 F (36.9 C)     TempSrc: Oral     SpO2:  97% 97% 94%  Weight:      Height:       Physical Exam Constitutional:      Appearance: She is normal weight.  HENT:     Head: Normocephalic.     Nose: Nose normal.     Mouth/Throat:     Mouth: Mucous membranes are moist.     Pharynx: Oropharynx is clear.  Eyes:     Conjunctiva/sclera: Conjunctivae normal.     Pupils: Pupils are equal, round, and reactive to light.  Cardiovascular:     Rate and Rhythm: Normal rate and regular rhythm.     Pulses: Normal pulses.  Pulmonary:     Effort: Pulmonary effort is normal.  Abdominal:     General: Abdomen is flat. Bowel sounds are normal.  Musculoskeletal:        General: Normal range of motion.     Cervical back: Normal range of motion.  Skin:    General: Skin is warm.      Capillary Refill: Capillary refill takes less than 2 seconds.  Neurological:     General: No focal deficit present.     Mental Status: She is alert.  Psychiatric:        Mood and Affect: Mood normal.       Labs on Admission: I have personally reviewed the patients's labs and imaging studies.  Assessment/Plan Principal Problem:   Weakness   # Metabolic encephalopathy # Urinary retention - Patient presenting weakness found to have hyponatremia and hypokalemia - Foley catheter was placed  Plan: Continue Foley Replete potassium and monitor sodium in the morning  # Shortness of breath most likely secondary to right sided pleural effusion # Acute on chronic diastolic heart failure exacerbation, primary hypertension - Patient is on sildenafil  and Lasix  - Patient's history of prior thoracentesis  Plan: Obtain thoracentesis for right side effusion. Hold further IV diuresis  # Permanent A-fib with tachybradycardia syndrome status post pacemaker-hold Eliquis  due to possible procedure  # Hyperthyroidism-continue methimazole   # CHF, not in exacerbation-continue home Lasix   # GERD-continue Protonix   # Pulm hypertension-continue sildenafil   # Urinary retention-continue Flomax , Foley in place    Admission status: Inpatient Med-Surg  Certification: The appropriate patient status for this patient is INPATIENT. Inpatient status is judged to be reasonable and necessary in order to provide the required intensity of service to ensure the patient's safety. The patient's presenting symptoms, physical exam findings, and initial radiographic and laboratory data in the context of their chronic comorbidities is felt to place them at high risk for further clinical  deterioration. Furthermore, it is not anticipated that the patient will be medically stable for discharge from the hospital within 2 midnights of admission.   * I certify that at the point of admission it is my clinical judgment  that the patient will require inpatient hospital care spanning beyond 2 midnights from the point of admission due to high intensity of service, high risk for further deterioration and high frequency of surveillance required.DEWAINE Lamar Dess MD [facility] If 7PM-7AM, please contact night-coverage [email or web address]  05/13/2024, 12:52 AM

## 2024-05-13 NOTE — Progress Notes (Signed)
 New Admission Note:   Arrival Method: stretcher Mental Orientation: aa+ox4 Telemetry: SR Assessment: Completed Skin:c/d/i IV: SL Pain: denies Tubes: n/a Safety Measures: Safety Fall Prevention Plan has been given, discussed and signed Admission: Completed [facility] Orientation: Patient has been orientated to the room, unit and staff.  Family: notified  Orders have been reviewed and implemented. Will continue to monitor the patient. Call light has been placed within reach and bed alarm has been activated.   Doyal Sias, RN

## 2024-05-13 NOTE — Progress Notes (Signed)
 [facility]   ROUNDING   NOTE Hailey [name] FMW:[id number]  DOB: June 19, 1945  DOA: 05/12/2024  PCP: Hailey Jereld BROCKS, NP  05/13/2024,6:20 AM  LOS: 0 days    Code Status: Full code     from: Home   78 year old white female A-fib with tachybradycardia syndrome CHADVASC 6+ PPM-RF ablation 08/10/2013+ Saint Jude Moderate CAD 70% RCA stenosis on cath 2014 medically treated HFpEF + pulmonary hypertension  hospitalization [facility] 6/1 through 11/03/2023 acute CHF exacerbation EF 55% HTN Multinodular left thyroid  goiter status post hemithyroidectomy 09/17/2022 on methimazole  Pulmonary nodules diagnosed 06/2023 6 mm stable from 2018  Rehospitalized 11/28-12/09/21/2023 SOB progressive discomfort urinary retention-thoracentesis X2 for pleural effusion-workup including rheumatological follow-up performed-fluid looked exudative  Workup so far negative  12/1 right heart cath mild pulmonary arterial hypertension with normal left-sided pressures and cardiac output no change in PVR and sildenafil  was increased to 3 times daily and told follow-up to discuss macitentan versus sotatercept with advanced heart failure team  12/10 presented back to the emergency room with urinary retention and shortness of breath?  Constipation underlying   Pertinent imaging/studies till date  Sodium 126 potassium 3.3 BUN/creatinine 12/0.6 AST/ALT 16/16 bilirubin 1.0 WBC 15.6 platelet 551 hemoglobin 13.2 Troponin trend flat CXR progressive right basilar consolidation and enlarging right pleural effusion   Assessment  & Plan :    Recurrent urinary retention possibly secondary to constipation Promote good regimen MiraLAX  17 twice daily add senna at bedtime if no stool Foley catheter has been placed, continues Flomax  0.4 Watch for effect  hyponatremia on admission-probably tea toast potomania SIADH Baseline sodium over the past month is high 120 RANGE--- previous to that sodium has been normal Last hospitalization weight was  46 kg currently is 42 will get standing weight to verify We will obtain urine sodium urine osmolality-calculated osmolality is 271  Hypervolemia secondary to acute superimposed on chronic diastolic heart failure Underlying mild pulmonary arterial hypertension Continues on Lasix  home dose 40 twice daily sildenafil  40 3 times daily  Large right-sided pleural effusion-exudative nature Lab review does not suggest autoimmune process based on cytologies (DRV TT negative anti-Jo beta-2  glycoprotein Lupus anticoagulant cardiolipin all normal )--- was given steroids with tapering doses until 12/10 at last hospitalization but has developed effusion despite this--- follow thoracentesis tap get LDH get albumin  and recheck pleural fluid labs Would hold any antibiotics at this time as just finished a course of steroids and is not febrile Will ask pulmonary to assess (sees Comer Rouleau 04/14/24 in the outpatient setting for OSA]once the effusion results come back as it was previously exudative I suspect she may need further diagnostic testing and will probably need a CT of the chest to start as I do not think that this effusion is only from heart failure  Permanent A-fib CHADVASC 6 Saint Jude PPM on Eliquis  Eliquis  has been held on admission with thoracentesis pending  Hyperthyroidism with multinodular goiter status post thyroidectomy 09/2022 on methimazole  Continuing Tapazole  20 daily-outpatient follow-up  Moderate CAD 2014 medically treatment Continues above meds   Data Reviewed today:  WBC 12.8 hemoglobin 11.8 platelet 523 Sodium 127 potassium 3.7 chloride 85 BUN/creatinine 11/0.6 WBC 12.8 hemoglobin 11.8 platelet 523  DVT prophylaxis: Eliquis  has been held SCDs for now  Status is: Inpatient Inpatient pending resolution and pulmonary discussion    Dispo/Global plan: Inpatient   Time 60   Subjective:   She is comfortable on room air but does not feel good She is a little constipated and  somewhat short of breath and says this has been going on No fever but does feel congested in her nose      Objective + exam Vitals:   05/13/24 0330 05/13/24 0400 05/13/24 0430 05/13/24 0500  BP: (!) 118/58 122/62 (!) 119/50 (!) 128/52  Pulse: 71 83 75 82  Resp: (!) 21 (!) 22 (!) 21 (!) 23  Temp:      TempSrc:      SpO2: 94% 91% 93% 95%  Weight:      Height:       Filed Weights   05/12/24 1503  Weight: 42.2 kg     Examination: Cachectic white female no distress No JVD S1-S2 no murmur seems to be in A-fib on monitors Abdomen soft with slight tenderness in right upper quadrant No rebound no guarding No lower extremity edema  Scheduled Meds:  furosemide   40 mg Oral BID   loratadine   10 mg Oral Daily   methimazole   20 mg Oral Daily   pantoprazole   40 mg Oral BID   polyethylene glycol  17 g Oral BID   senna-docusate  1 tablet Oral QHS   sildenafil   40 mg Oral TID   tamsulosin   0.4 mg Oral Daily   tiZANidine   2 mg Oral QHS   Continuous Infusions: acetaminophen  **OR** acetaminophen , ondansetron  **OR** ondansetron  (ZOFRAN ) IV  Jai-Gurmukh Sekai Nayak, MD  [facility]

## 2024-05-13 NOTE — Procedures (Signed)
 PROCEDURE SUMMARY:  Successful US  guided right thoracentesis. Yielded 800 ml of clear yellow fluid. Pt tolerated procedure well. No immediate complications.  Specimen sent for labs. CXR ordered; no post-procedure pneumothorax identified.   EBL < 2 mL  Warren JONELLE Dais, NP 05/13/2024 11:44 AM

## 2024-05-14 ENCOUNTER — Inpatient Hospital Stay (HOSPITAL_COMMUNITY)

## 2024-05-14 ENCOUNTER — Ambulatory Visit: Admitting: Cardiovascular Disease

## 2024-05-14 DIAGNOSIS — I4891 Unspecified atrial fibrillation: Secondary | ICD-10-CM | POA: Diagnosis not present

## 2024-05-14 DIAGNOSIS — I272 Pulmonary hypertension, unspecified: Secondary | ICD-10-CM | POA: Diagnosis not present

## 2024-05-14 DIAGNOSIS — R130 Aphagia: Secondary | ICD-10-CM | POA: Diagnosis not present

## 2024-05-14 DIAGNOSIS — J90 Pleural effusion, not elsewhere classified: Secondary | ICD-10-CM | POA: Diagnosis not present

## 2024-05-14 DIAGNOSIS — R531 Weakness: Secondary | ICD-10-CM | POA: Diagnosis not present

## 2024-05-14 DIAGNOSIS — I5033 Acute on chronic diastolic (congestive) heart failure: Secondary | ICD-10-CM | POA: Diagnosis not present

## 2024-05-14 DIAGNOSIS — Z4682 Encounter for fitting and adjustment of non-vascular catheter: Secondary | ICD-10-CM | POA: Diagnosis not present

## 2024-05-14 DIAGNOSIS — I700 Atherosclerosis of aorta: Secondary | ICD-10-CM | POA: Diagnosis not present

## 2024-05-14 DIAGNOSIS — K219 Gastro-esophageal reflux disease without esophagitis: Secondary | ICD-10-CM | POA: Diagnosis not present

## 2024-05-14 DIAGNOSIS — J9811 Atelectasis: Secondary | ICD-10-CM | POA: Diagnosis not present

## 2024-05-14 LAB — SODIUM, URINE, RANDOM: Sodium, Ur: <30 mmol/L

## 2024-05-14 LAB — CBC WITH DIFFERENTIAL/PLATELET
Abs Immature Granulocytes: 0.07 K/uL (ref 0.00–0.07)
Basophils Absolute: 0 K/uL (ref 0.0–0.1)
Basophils Relative: 0 %
Eosinophils Absolute: 0.1 K/uL (ref 0.0–0.5)
Eosinophils Relative: 1 %
HCT: 37.8 % (ref 36.0–46.0)
Hemoglobin: 12.1 g/dL (ref 12.0–15.0)
Immature Granulocytes: 1 %
Lymphocytes Relative: 9 %
Lymphs Abs: 1.1 K/uL (ref 0.7–4.0)
MCH: 25.8 pg — ABNORMAL LOW (ref 26.0–34.0)
MCHC: 32 g/dL (ref 30.0–36.0)
MCV: 80.6 fL (ref 80.0–100.0)
Monocytes Absolute: 1.1 K/uL — ABNORMAL HIGH (ref 0.1–1.0)
Monocytes Relative: 8 %
Neutro Abs: 10.2 K/uL — ABNORMAL HIGH (ref 1.7–7.7)
Neutrophils Relative %: 81 %
Platelets: 495 K/uL — ABNORMAL HIGH (ref 150–400)
RBC: 4.69 MIL/uL (ref 3.87–5.11)
RDW: 15.5 % (ref 11.5–15.5)
WBC: 12.4 K/uL — ABNORMAL HIGH (ref 4.0–10.5)
nRBC: 0 % (ref 0.0–0.2)

## 2024-05-14 LAB — HEPATIC FUNCTION PANEL
ALT: 13 U/L (ref 0–44)
AST: 14 U/L — ABNORMAL LOW (ref 15–41)
Albumin: 2.5 g/dL — ABNORMAL LOW (ref 3.5–5.0)
Alkaline Phosphatase: 100 U/L (ref 38–126)
Bilirubin, Direct: 0.4 mg/dL — ABNORMAL HIGH (ref 0.0–0.2)
Indirect Bilirubin: 0.9 mg/dL (ref 0.3–0.9)
Total Bilirubin: 1.3 mg/dL — ABNORMAL HIGH (ref 0.0–1.2)
Total Protein: 5.4 g/dL — ABNORMAL LOW (ref 6.5–8.1)

## 2024-05-14 LAB — BASIC METABOLIC PANEL WITH GFR
Anion gap: 11 (ref 5–15)
BUN: 10 mg/dL (ref 8–23)
CO2: 30 mmol/L (ref 22–32)
Calcium: 7.9 mg/dL — ABNORMAL LOW (ref 8.9–10.3)
Chloride: 85 mmol/L — ABNORMAL LOW (ref 98–111)
Creatinine, Ser: 0.8 mg/dL (ref 0.44–1.00)
GFR, Estimated: >60 mL/min (ref >60)
Glucose, Bld: 115 mg/dL — ABNORMAL HIGH (ref 70–99)
Potassium: 3.3 mmol/L — ABNORMAL LOW (ref 3.5–5.1)
Sodium: 126 mmol/L — ABNORMAL LOW (ref 135–145)

## 2024-05-14 LAB — OSMOLALITY, URINE: Osmolality, Ur: 434 mosm/kg (ref 300–900)

## 2024-05-14 LAB — MISC LABCORP TEST (SEND OUT): Source (LabCorp): 2

## 2024-05-14 LAB — PROTEIN, BODY FLUID (OTHER): Total Protein, Body Fluid Other: 4 g/dL

## 2024-05-14 LAB — LACTATE DEHYDROGENASE: LDH: 109 U/L (ref 105–235)

## 2024-05-14 LAB — PATHOLOGIST SMEAR REVIEW

## 2024-05-14 MED ORDER — ORAL CARE MOUTH RINSE
15.0000 mL | OROMUCOSAL | Status: DC
  Administered 2024-05-14 – 2024-05-22 (×24): 15 mL via OROMUCOSAL

## 2024-05-14 MED ORDER — FLEET ENEMA RE ENEM
1.0000 | ENEMA | Freq: Once | RECTAL | Status: AC
  Administered 2024-05-14: 1 via RECTAL
  Filled 2024-05-14: qty 1

## 2024-05-14 MED ORDER — SODIUM CHLORIDE 0.9 % IV BOLUS
1000.0000 mL | Freq: Once | INTRAVENOUS | Status: AC
  Administered 2024-05-14: 1000 mL via INTRAVENOUS

## 2024-05-14 MED ORDER — POTASSIUM CHLORIDE 10 MEQ/100ML IV SOLN
10.0000 meq | INTRAVENOUS | Status: AC
  Administered 2024-05-14 (×3): 10 meq via INTRAVENOUS
  Filled 2024-05-14 (×3): qty 100

## 2024-05-14 MED ORDER — SODIUM CHLORIDE 0.9 % IV SOLN: INTRAVENOUS | Status: DC

## 2024-05-14 MED ORDER — ORAL CARE MOUTH RINSE: 15.0000 mL | OROMUCOSAL | Status: DC | PRN

## 2024-05-14 MED ORDER — ORAL CARE MOUTH RINSE
15.0000 mL | OROMUCOSAL | Status: DC
  Administered 2024-05-14 – 2024-05-24 (×35): 15 mL via OROMUCOSAL

## 2024-05-14 MED ORDER — ENOXAPARIN SODIUM 40 MG/0.4ML IJ SOSY
40.0000 mg | PREFILLED_SYRINGE | Freq: Two times a day (BID) | INTRAMUSCULAR | Status: DC
  Administered 2024-05-14: 40 mg via SUBCUTANEOUS
  Filled 2024-05-14: qty 0.4

## 2024-05-14 MED ORDER — CHLORHEXIDINE GLUCONATE CLOTH 2 % EX PADS
6.0000 | MEDICATED_PAD | Freq: Every day | CUTANEOUS | Status: DC
  Administered 2024-05-14 – 2024-05-24 (×11): 6 via TOPICAL

## 2024-05-14 NOTE — Progress Notes (Signed)
 [facility]   ROUNDING   NOTE Hailey [name] FMW:[id number]  DOB: 1945-12-03  DOA: 05/12/2024  PCP: [name], NP  05/14/2024,8:54 AM  LOS: 1 day    Code Status: Full code     from: Home   78 year old white female A-fib with tachybradycardia syndrome CHADVASC 6+ PPM-RF ablation 08/10/2013+ Saint Jude Moderate CAD 70% RCA stenosis on cath 2014 medically treated HFpEF + pulmonary hypertension  hospitalization [facility] 6/1 through 11/03/2023 acute CHF exacerbation EF 55% HTN Multinodular left thyroid  goiter status post hemithyroidectomy 09/17/2022 on methimazole  Pulmonary nodules diagnosed 06/2023 6 mm stable from 2018  Rehospitalized 11/28-12/09/21/2023 SOB progressive discomfort urinary retention-thoracentesis X2 for pleural effusion-workup including rheumatological follow-up performed-fluid looked exudative  Workup so far negative  12/1 right heart cath mild pulmonary arterial hypertension with normal left-sided pressures and cardiac output no change in PVR and sildenafil  was increased to 3 times daily and told follow-up to discuss macitentan versus sotatercept with advanced heart failure team  12/10 presented back to the emergency room with urinary retention and shortness of breath?  In the setting of also constipation underlying and a Foley catheter was placed    Pertinent imaging/studies till date  Sodium 126 potassium 3.3 BUN/creatinine 12/0.6 AST/ALT 16/16 bilirubin 1.0 WBC 15.6 platelet 551 hemoglobin 13.2 Troponin trend flat CXR progressive right basilar consolidation and enlarging right pleural effusion  12/11 thoracentesis 800 cc--CT chest increasing right pulmonary effusions no findings of nodularity to suggest malignant effusion right lower lobe collapse consolidation felt secondary to atelectasis bilateral pulmonary nodules 8 mm?  Benign mild right hilar adenopathy likely benign   Assessment  & Plan :    Recurrent urinary retention possibly secondary to  constipation Promote good regimen MiraLAX  17 twice daily add senna at bedtime if no stool Foley catheter placed 12/10, continues Flomax  0.4--- voiding trial today  Hypovolemic hyponatremia on admission-serum osmolality 271 urine osmolality 347 urine sodium less than 30 She is losing weight, weight is down from 46 to 42 kg and now is 40-Istopped her Lasix  40 twice daily we will give a saline challenge and recheck her electrolytes after this to see if it responds Based on those electrolytes we will determine next steps going forward  chronic diastolic heart failure-do not think acute component Underlying mild pulmonary arterial hypertension Continue only sildenafil  40 3 times daily--- holding at this time Aldactone  25 Lasix  40 twice daily  Large right-sided pleural effusion-exudative nature based on recent lab tests Concern from my perspective of cryptogenic pleural effusion-I do not think this is autoimmune and I am not really sure with hypovolemia and loss of weight that she is volume overloaded Her neutrophil count on the fluid percentage is 65% raising concern for inflammatory process Lab review does not suggest autoimmune process based on cytologies (DRV TT negative anti-Jo beta-2  glycoprotein Lupus anticoagulant cardiolipin all normal )--- was given steroids with tapering doses until 12/10 at last hospitalization  Antibiotics/steroids held--- follow x-ray from this morning Defer to pulmonary who have seen the patient-follow CCP RF cytology path smear review-  Dysphagia since thyroidectomy -recent endoscopy 06/30/2023 by Dr. Cindie showed gastric polyps which were negative for malignancy on PPI 40 twice daily which she is taking----adding Carafate 1 g 3 times daily as well as Pepcid and see if this helps the issue Because of persisting symptoms and weight loss will get a barium swallow and depending on that can discuss with GI  Permanent A-fib CHADVASC 6 Saint Jude PPM on Eliquis  Eliquis   has  been held on admission  Will place on subcu Lovenox   Hyperthyroidism with multinodular goiter status post thyroidectomy 09/2022 on methimazole  Continuing Tapazole  20 daily-outpatient follow-up-would not workup further in hospital  Moderate CAD 2014 medically treatment Continues above meds   Data Reviewed today:  Sodium 126 potassium 3.3 BUN/creatinine 10/0.8 AST/ALT 14/13 bilirubin 1.3 WBC 12.4 hemoglobin 12.1 platelet 495  DVT prophylaxis: Eliquis  has been held SCDs for now  Status is: Inpatient Inpatient pending resolution and pulmonary discussion    Dispo/Global plan: Inpatient   Time 60   Subjective:   Severe dysphagia unable to really eat or drink have told her to just take liquids She has abdominal pain but passed a little bit of stool as well as lower extremity pain She is not short winded and is on room air Does have mild chest pain as well from swallowing and has not been able to take some meds this morning   Objective + exam Vitals:   05/13/24 1846 05/13/24 2053 05/14/24 0545 05/14/24 0749  BP: 123/61 124/62 (!) 106/52 (!) 122/59  Pulse: 87 75 70 77  Resp: 18 16 18 18   Temp: 98 F (36.7 C) 97.7 F (36.5 C) 98.5 F (36.9 C) 97.7 F (36.5 C)  TempSrc:  Oral    SpO2: 95% 95% 96% 95%  Weight:      Height:       Filed Weights   05/12/24 1503 05/13/24 1836  Weight: 42.2 kg 40.8 kg     Examination:  EOMI NCAT no focal deficits frail cachectic white female no distress ROM intact Decreased air entry posterolaterally on the right side Abdomen slight distention no rebound no guarding Power 5/5 No lower extremity edema no JVD  Scheduled Meds:  Chlorhexidine  Gluconate Cloth  6 each Topical Daily   enoxaparin  (LOVENOX ) injection  40 mg Subcutaneous Q12H   loratadine   10 mg Oral Daily   methimazole   20 mg Oral Daily   mouth rinse  15 mL Mouth Rinse 4 times per day   mouth rinse  15 mL Mouth Rinse 4 times per day   pantoprazole   40 mg Oral BID    polyethylene glycol  17 g Oral BID   senna-docusate  1 tablet Oral QHS   sildenafil   40 mg Oral TID   sodium phosphate   1 enema Rectal Once   tamsulosin   0.4 mg Oral Daily   tiZANidine   2 mg Oral QHS   Continuous Infusions: acetaminophen  **OR** acetaminophen , albuterol , fluticasone , ondansetron  **OR** ondansetron  (ZOFRAN ) IV, mouth rinse, mouth rinse, sodium chloride , traMADol   Jai-Gurmukh Jeronda Don, MD  [facility]

## 2024-05-14 NOTE — Progress Notes (Signed)
 Readmits with SOB ( pleural effusion), weakness, urinary retention   05/14/24 0810  TOC Brief Assessment  Insurance and Status Reviewed  Patient has primary care physician Yes  Prior/Current Home Services Current home services [facility])  Social Drivers of Health Review SDOH reviewed no interventions necessary  Readmission risk has been reviewed Yes (sob/ pleural effusion, weakness, urinary retention)  Transition of care needs transition of care needs identified, TOC will continue to follow   Pt from home with husband. Husband with Parkinson dz. Supportive children, 2 daughters and a son ( all works F-[employer]). Previous admission pt declined SNF placement. Per daughter pt might consider SNF / rehab now with increased weakness. Pt with RW @ home. PTA active with [facility].  IP CM team following and will assist with needs... Jon Hoit RN,BSN,CM [phone]

## 2024-05-14 NOTE — Consult Note (Signed)
 NAME:  Hailey [name], MRN:  [id number], DOB:  21-Nov-1945, LOS: 1 ADMISSION DATE:  05/12/2024, CONSULTATION DATE:  05/14/2024 REFERRING MD:  Royal Sill, MD, CHIEF COMPLAINT:  recurrent pleural effusion   History of Present Illness:   Hailey [name] is a 78 y.o. woman with extensive past medical history type 2 diabetes, coronary artery disease, chronic diastolic heart failure, permanent atrial fibrillation status post pacemaker, hypothyroidism.  In November she was admitted with worsening shortness of breath and was found to have right pleural effusion for which she underwent thoracentesis with IR x 2.  This was exudative based on LDH.  She was diuresed with IV Lasix .  She also has secondary pulmonary hypertension and is on sildenafil  per Dr. Bensimhon.  During this admission she had autoimmune testing done due to uncertainty about the nature of the effusion.  Her ANA was positive.  Complement levels were normal.  Her ESR and CRP were elevated.  She also had right heart catheterization this admission which showed normal left-sided lung pressures and cardiac output.  The plan had been to follow-up as an outpatient to discuss additional pulmonary vasodilators given her mild elevation in PVR despite sildenafil  therapy.  She was started on empiric steroid therapy with prednisone  and told to follow-up with rheumatology as an outpatient for ongoing management of the effusion.  He was recommended be discharged to SNF but she declined and went home.  She presents now again with worsening shortness of breath, abdominal distention, effusion.  Pulmonary being consulted for management of the right pleural effusion.  Of note note , MBS performed last admission- no aspiration.   She has not yet seen rheumatology.    Pertinent  Medical History    Significant Hospital Events: Including procedures, antibiotic start and stop dates in addition to other pertinent events     Interim History /  Subjective:   No overnight issues. Having pain from potassium instiliation  Objective    Blood pressure (!) 122/59, pulse 77, temperature 97.7 F (36.5 C), resp. rate 18, height 4' 11 (1.499 m), weight 40.8 kg, SpO2 95%.        Intake/Output Summary (Last 24 hours) at 05/14/2024 1303 Last data filed at 05/14/2024 0600 Gross per 24 hour  Intake 597 ml  Output 350 ml  Net 247 ml   Filed Weights   05/12/24 1503 05/13/24 1836  Weight: 42.2 kg 40.8 kg    Examination: Frail elderly woman on nasal cannula No respiratory distress RRR Breath sounds diminished, right lung base No peripheral edema  Previously pleural fluid studies May 03 2024 LDH 140 Cell count 5100 TNC 80% neutrophil, although there was fibrin clumping Before that 7300, monocyte predominant Chest xray post thoracentesis shows reduced right pleural effusion, no ptx    Resolved problem list   Assessment and Plan   Recurrent right pleural effusion +ANA, elevated inflammatory markers Acute on chronic HFpEF Pulmonary hypertension Group 1-2 Atrial fibrillation s/p ppm  Differential diagnosis for the effusion is possible autoimmune mediated from RA vs pseudoexudative from chronic chf.  The pleural fluid studies are decidedly less inflammatory from the previous two collections.   RF and CCP serum are pending. RF pleural fluid has been sent out to [facility].   I think this is most likely pseudoexudative. LDH from today's pleural fluid was much lower. RPE is usually high exudative and proteinaceous with low glucose.   I offered her repeat thoracentesis for drainage vs chest tube to drain the effusion dry vs  chest tube plus talc for recurrent effusion. She would like to discuss these options with her daughter who is HCPOA. I updated her middle daughter who is at bedside. They are feeling overwhelmed by multiple medical issues and worsening morbidity and high symptom burden. Palliative consultation may be helpful here.    Verdon Gore, MD Pulmonary and Critical Care Medicine [facility] 05/14/2024 1:03 PM Pager: see AMION  If no response to pager, please call critical care on call (see AMION) until 7pm After 7:00 pm call Elink      Labs   CBC: Recent Labs  Lab 05/12/24 1558 05/13/24 0534 05/14/24 0445  WBC 15.6* 12.8* 12.4*  NEUTROABS  --   --  10.2*  HGB 13.2 11.8* 12.1  HCT 41.1 36.9 37.8  MCV 82.0 81.6 80.6  PLT 551* 523* 495*    Basic Metabolic Panel: Recent Labs  Lab 05/12/24 1558 05/13/24 0534 05/14/24 0445  NA 126* 127* 126*  K 3.3* 3.7 3.3*  CL 84* 85* 85*  CO2 30 32 30  GLUCOSE 192* 106* 115*  BUN 12 11 10   CREATININE 0.68 0.65 0.80  CALCIUM  8.3* 8.4* 7.9*   GFR: Estimated Creatinine Clearance: 36.7 mL/min (by C-G formula based on SCr of 0.8 mg/dL). Recent Labs  Lab 05/12/24 1558 05/13/24 0534 05/14/24 0445  WBC 15.6* 12.8* 12.4*    Liver Function Tests: Recent Labs  Lab 05/12/24 1558 05/14/24 0445  AST 16 14*  ALT 16 13  ALKPHOS 118 100  BILITOT 1.0 1.3*  PROT 6.6 5.4*  ALBUMIN  3.2* 2.5*   No results for input(s): LIPASE, AMYLASE in the last 168 hours. No results for input(s): AMMONIA in the last 168 hours.  ABG    Component Value Date/Time   PHART 7.415 (H) 11/25/2010 1435   PCO2ART 38.8 11/25/2010 1435   PO2ART 67.4 (L) 11/25/2010 1435   HCO3 33.7 (H) 05/03/2024 1154   TCO2 35 (H) 05/03/2024 1154   O2SAT 75 05/03/2024 1154     Coagulation Profile: Recent Labs  Lab 05/13/24 0534  INR 1.5*    Cardiac Enzymes: No results for input(s): CKTOTAL, CKMB, CKMBINDEX, TROPONINI in the last 168 hours.  HbA1C: Hgb A1c MFr Bld  Date/Time Value Ref Range Status  05/01/2024 09:35 AM 6.5 (H) 4.8 - 5.6 % Final    Comment:    (NOTE)         Prediabetes: 5.7 - 6.4         Diabetes: >6.4         Glycemic control for adults with diabetes: <7.0   05/13/2023 02:18 PM 7.3 (H) <5.7 % of total Hgb Final    Comment:    For  someone without known diabetes, a hemoglobin A1c value of 6.5% or greater indicates that they may have  diabetes and this should be confirmed with a follow-up  test. . For someone with known diabetes, a value <7% indicates  that their diabetes is well controlled and a value  greater than or equal to 7% indicates suboptimal  control. A1c targets should be individualized based on  duration of diabetes, age, comorbid conditions, and  other considerations. . Currently, no consensus exists regarding use of hemoglobin A1c for diagnosis of diabetes for children. .     CBG: No results for input(s): GLUCAP in the last 168 hours.

## 2024-05-14 NOTE — Progress Notes (Signed)
 PHARMACY - ANTICOAGULATION CONSULT NOTE  Pharmacy Consult for Lovenox  Indication: atrial fibrillation  Allergies[1]  Patient Measurements: Height: 4' 11 (149.9 cm) Weight: 40.8 kg (90 lb) IBW/kg (Calculated) : 43.2 HEPARIN  DW (KG): 42.2  Vital Signs: Temp: 97.7 F (36.5 C) (12/12 0749) BP: 122/59 (12/12 0749) Pulse Rate: 77 (12/12 0749)  Labs: Recent Labs    05/12/24 1558 05/12/24 2040 05/13/24 0534 05/14/24 0445  HGB 13.2  --  11.8* 12.1  HCT 41.1  --  36.9 37.8  PLT 551*  --  523* 495*  LABPROT  --   --  19.2*  --   INR  --   --  1.5*  --   CREATININE 0.68  --  0.65 0.80  TROPONINIHS 10 10  --   --     Estimated Creatinine Clearance: 36.7 mL/min (by C-G formula based on SCr of 0.8 mg/dL).   Medical History: Past Medical History:  Diagnosis Date   Acute on chronic diastolic CHF (congestive heart failure), NYHA class 1 (HCC) 04/29/2013   Allergic rhinitis    Brady-tachy syndrome (HCC)    s/p STJ dual chamber PPM by Dr Francyne   CAD (coronary artery disease)    70% RCA stenosis, treated medically   Control of atrial fibrillation with pacemaker (HCC)    Diabetes mellitus (HCC)    type 2   DJD (degenerative joint disease)    Dyspnea    occasional   Hearing loss    Right ear only - no hearing aids   Hypertension    Hyperthyroidism    Paroxysmal atrial fibrillation (HCC)    Pacemaker St Jude   Pneumonia    years ago- before 2012 per patient   Polio    as a child   PONV (postoperative nausea and vomiting)    Presence of permanent cardiac pacemaker    St Jude pacemaker   Pulmonary disease    Sleep apnea    uses  CPAP nightly    Medications:  Medications Prior to Admission  Medication Sig Dispense Refill Last Dose/Taking   acetaminophen  (TYLENOL ) 500 MG tablet Take 1,000 mg by mouth daily as needed for mild pain (pain score 1-3) or moderate pain (pain score 4-6).   Past Week   Cholecalciferol  (VITAMIN D3) 1000 UNITS CAPS Take 1,000 Units by mouth  in the morning.   05/12/2024   ELIQUIS  5 MG TABS tablet TAKE ONE TABLET BY MOUTH TWICE DAILY 60 tablet 5 05/12/2024 Morning   fluticasone  (FLONASE ) 50 MCG/ACT nasal spray Place 2 sprays into both nostrils daily as needed for allergies or rhinitis. (Patient taking differently: Place 2 sprays into both nostrils daily.) 16 g 6 05/12/2024   furosemide  (LASIX ) 40 MG tablet Take 1 tablet (40 mg total) by mouth 2 (two) times daily.   05/12/2024   loperamide  (IMODIUM  A-D) 2 MG tablet Take 2 mg by mouth daily as needed for diarrhea or loose stools.   Unknown   loratadine  (CLARITIN ) 10 MG tablet Take 1 tablet (10 mg total) by mouth daily. 30 tablet 6 05/12/2024   methimazole  (TAPAZOLE ) 10 MG tablet Take 20 mg (two tablets) daily.   05/12/2024   ondansetron  (ZOFRAN ) 4 MG tablet TAKE ONE TABLET BY MOUTH EVERY EIGHT HOURS AS NEEDED FOR NAUSEA OR VOMITING 20 tablet 0 05/12/2024   pantoprazole  (PROTONIX ) 40 MG tablet TAKE ONE TABLET BY MOUTH TWICE DAILY BEFORE A MEAL (Patient taking differently: Take 40 mg by mouth daily.) 60 tablet 5 05/12/2024   polyethylene  glycol powder (GLYCOLAX /MIRALAX ) 17 GM/SCOOP powder Take 17 g by mouth 2 (two) times daily. Dissolve 1 capful (17g) in 4-8 ounces of liquid and take by mouth daily. (Patient taking differently: Take 17 g by mouth daily as needed for mild constipation, moderate constipation or severe constipation.) 238 g 0 Unknown   potassium chloride  SA (KLOR-CON  M) 20 MEQ tablet Take 20 mEq by mouth daily.   05/12/2024   senna-docusate (SENOKOT-S) 8.6-50 MG tablet Take 2 tablets by mouth 2 (two) times daily. (Patient taking differently: Take 1-2 tablets by mouth daily as needed for mild constipation or moderate constipation.) 120 tablet 0 Unknown   sildenafil  (REVATIO ) 20 MG tablet Take 2 tablets (40 mg total) by mouth 3 (three) times daily. 180 tablet 2 05/12/2024   spironolactone  (ALDACTONE ) 25 MG tablet Take 25 mg by mouth daily.   05/12/2024   tamsulosin  (FLOMAX ) 0.4 MG  CAPS capsule Take 1 capsule (0.4 mg total) by mouth daily. 30 capsule 0 05/12/2024   tiZANidine  (ZANAFLEX ) 2 MG tablet Take 2 mg by mouth at bedtime.   Past Week   traMADol  (ULTRAM ) 50 MG tablet Take 50 mg by mouth daily as needed for moderate pain (pain score 4-6) or severe pain (pain score 7-10).   Past Week   albuterol  (VENTOLIN  HFA) 108 (90 Base) MCG/ACT inhaler Inhale 2 puffs into the lungs every 6 (six) hours as needed for wheezing or shortness of breath. (Patient not taking: Reported on 05/13/2024) 8 g 1 Not Taking   [EXPIRED] predniSONE  (DELTASONE ) 10 MG tablet Take 2 tablets (20 mg total) by mouth daily with breakfast for 3 days, THEN 1 tablet (10 mg total) daily with breakfast for 3 days. (Patient not taking: Reported on 05/13/2024) 9 tablet 0 Not Taking   Scheduled:   Chlorhexidine  Gluconate Cloth  6 each Topical Daily   enoxaparin  (LOVENOX ) injection  40 mg Subcutaneous Q12H   loratadine   10 mg Oral Daily   methimazole   20 mg Oral Daily   mouth rinse  15 mL Mouth Rinse 4 times per day   mouth rinse  15 mL Mouth Rinse 4 times per day   pantoprazole   40 mg Oral BID   polyethylene glycol  17 g Oral BID   senna-docusate  1 tablet Oral QHS   sildenafil   40 mg Oral TID   tamsulosin   0.4 mg Oral Daily   tiZANidine   2 mg Oral QHS   Infusions:   Assessment: Pt has been on apixaban  prior to admission for AF. It has been on hold for procedures. S/p thoracentesis 12/11. Order to bridge with lovenox .  CBC ok Scr<1  Goal of Therapy:  Anti-Xa level 0.6-1 units/ml 4hrs after LMWH dose given Monitor platelets by anticoagulation protocol: Yes   Plan:  Lovenox  40mg  SQ BID F/u resume apixaban   Sergio Batch, PharmD, BCIDP, AAHIVP, CPP Infectious Disease Pharmacist 05/14/2024 9:18 AM        [1]  Allergies Allergen Reactions   Codeine Nausea And Vomiting   Ketorolac  Nausea And Vomiting and Other (See Comments)    pt told to never take this med   Meperidine  Nausea Only and  Nausea And Vomiting   Multaq  [Dronedarone ] Other (See Comments)    Increased fluid retention   Xarelto [Rivaroxaban] Diarrhea   Flexeril  [Cyclobenzaprine ] Nausea And Vomiting   Latex Rash    Pt stated on 09/20/2022

## 2024-05-14 NOTE — Progress Notes (Signed)
 Daughter-in-law, [name], at the bedside at 2100.  While completing admission history, Code Status, addressed.  Per [name] and patient, they Do Not Want Chest Compressions but do want medications and intubation.  I advised that I would let on-call physician know.  Dr. Alfornia made aware and she requests that I refer to dayshift team to address with daughter-in-law [name] and patient since [name] had left for the night and patient does not have her bilateral hearing aids at the hospital at this time.  [name] to bring in the AM.  Also, [name] and patient ask that MD in the morning call her with an update on the patient's condition.  She will be primary contact.  The patient's husband has Parkinson's and not able to handle decisions regarding the patient's health.  Suzen Ice RN

## 2024-05-14 NOTE — TOC Progression Note (Addendum)
 Transition of Care [facility]) - Progression Note    Patient Details  Name: Hailey [name] MRN: [id number] Date of Birth: 08/18/1945  [facility] of [facility]) CM/SW Contact  Lendia Dais, [place] Phone Number: 05/14/2024, 4:34 PM  Clinical Narrative: MD notified CSW of pt's request for hospice care and requested the CSW to provide information for inpatient/residential hospice.  CSW met with the pt at bedside and provide a list of hospice facilites.   TOC will monitor for bed offers.                      Expected Discharge Plan and Services                                               Social Drivers of Health (SDOH) Interventions SDOH Screenings   Food Insecurity: No Food Insecurity (05/13/2024)  Housing: Low Risk (05/13/2024)  Transportation Needs: No Transportation Needs (05/13/2024)  Utilities: Not At Risk (05/13/2024)  Depression (PHQ2-9): Low Risk (06/18/2023)  Financial Resource Strain: Low Risk (11/03/2023)   Received from [facility]  Social Connections: Patient Declined (05/13/2024)  Tobacco Use: Low Risk (05/12/2024)    Readmission Risk Interventions     No data to display

## 2024-05-14 NOTE — Progress Notes (Signed)
°  Interdisciplinary Goals of Care Family Meeting   Date carried out: 05/14/2024  Location of the meeting: Bedside  Member's involved: Physician, Social Worker, and Family Member or next of kin  Durable Power of Attorney or acting medical decision maker: Daughter Hailey [name]  Discussion: We discussed goals of care for Hailey [name] .    I had a detailed discussion with her-she tells me I am tired-- I do not want any further potassium I want to go home and be peaceful She tells me that she is DNR and wants us  updated in the chart She also is asking to be put on medications for full comfort I spoke with her daughter Hailey [name] who seems a little bit surprised at turn of events and I have cautioned patient that we do need to plan so we will offer choice of hospice if she has made up her mind I have also mention holding comfort trajectory meds such as morphine  and Ativan  for right now until hospice can discuss how they would manage and what disposition her hospice care would be managed at Daughter and patient expressed good understanding and appreciation I have canceled labs canceled fluids keep IV open now and only give very essential meds Anticipate planning for freestanding hospice in the next several days once choice has been offered I have updated social worker to make sure that this is done today  Code status:   Code Status: Limited: Do not attempt resuscitation (DNR) -DNR-LIMITED -Do Not Intubate/DNI    Disposition: Home with Hospice  Time spent for the meeting: 45    Colen Grimes, MD  05/14/2024, 4:32 PM

## 2024-05-15 ENCOUNTER — Inpatient Hospital Stay (HOSPITAL_COMMUNITY)

## 2024-05-15 DIAGNOSIS — J90 Pleural effusion, not elsewhere classified: Secondary | ICD-10-CM | POA: Diagnosis not present

## 2024-05-15 DIAGNOSIS — R531 Weakness: Secondary | ICD-10-CM | POA: Diagnosis not present

## 2024-05-15 DIAGNOSIS — J9811 Atelectasis: Secondary | ICD-10-CM | POA: Diagnosis not present

## 2024-05-15 DIAGNOSIS — I2722 Pulmonary hypertension due to left heart disease: Secondary | ICD-10-CM

## 2024-05-15 DIAGNOSIS — I2721 Secondary pulmonary arterial hypertension: Secondary | ICD-10-CM

## 2024-05-15 DIAGNOSIS — E049 Nontoxic goiter, unspecified: Secondary | ICD-10-CM | POA: Diagnosis not present

## 2024-05-15 DIAGNOSIS — R918 Other nonspecific abnormal finding of lung field: Secondary | ICD-10-CM | POA: Diagnosis not present

## 2024-05-15 LAB — CBC WITH DIFFERENTIAL/PLATELET
Abs Immature Granulocytes: 0.1 K/uL — ABNORMAL HIGH (ref 0.00–0.07)
Basophils Absolute: 0 K/uL (ref 0.0–0.1)
Basophils Relative: 0 %
Eosinophils Absolute: 0.1 K/uL (ref 0.0–0.5)
Eosinophils Relative: 1 %
HCT: 35.9 % — ABNORMAL LOW (ref 36.0–46.0)
Hemoglobin: 11.5 g/dL — ABNORMAL LOW (ref 12.0–15.0)
Immature Granulocytes: 1 %
Lymphocytes Relative: 8 %
Lymphs Abs: 1 K/uL (ref 0.7–4.0)
MCH: 26 pg (ref 26.0–34.0)
MCHC: 32 g/dL (ref 30.0–36.0)
MCV: 81 fL (ref 80.0–100.0)
Monocytes Absolute: 0.9 K/uL (ref 0.1–1.0)
Monocytes Relative: 7 %
Neutro Abs: 11.5 K/uL — ABNORMAL HIGH (ref 1.7–7.7)
Neutrophils Relative %: 83 %
Platelets: 458 K/uL — ABNORMAL HIGH (ref 150–400)
RBC: 4.43 MIL/uL (ref 3.87–5.11)
RDW: 15.2 % (ref 11.5–15.5)
WBC: 13.6 K/uL — ABNORMAL HIGH (ref 4.0–10.5)
nRBC: 0 % (ref 0.0–0.2)

## 2024-05-15 LAB — COMPREHENSIVE METABOLIC PANEL WITH GFR
ALT: 10 U/L (ref 0–44)
AST: 17 U/L (ref 15–41)
Albumin: 2.5 g/dL — ABNORMAL LOW (ref 3.5–5.0)
Alkaline Phosphatase: 97 U/L (ref 38–126)
Anion gap: 9 (ref 5–15)
BUN: 8 mg/dL (ref 8–23)
CO2: 30 mmol/L (ref 22–32)
Calcium: 7.9 mg/dL — ABNORMAL LOW (ref 8.9–10.3)
Chloride: 87 mmol/L — ABNORMAL LOW (ref 98–111)
Creatinine, Ser: 0.85 mg/dL (ref 0.44–1.00)
GFR, Estimated: >60 mL/min (ref >60)
Glucose, Bld: 99 mg/dL (ref 70–99)
Potassium: 3.5 mmol/L (ref 3.5–5.1)
Sodium: 126 mmol/L — ABNORMAL LOW (ref 135–145)
Total Bilirubin: 1.2 mg/dL (ref 0.0–1.2)
Total Protein: 5.3 g/dL — ABNORMAL LOW (ref 6.5–8.1)

## 2024-05-15 LAB — RHEUMATOID FACTOR: Rheumatoid fact SerPl-aCnc: <10 [IU]/mL (ref <14.0)

## 2024-05-15 MED ORDER — SODIUM CHLORIDE 0.9 % IV SOLN: INTRAVENOUS | Status: AC

## 2024-05-15 MED ORDER — ENOXAPARIN SODIUM 40 MG/0.4ML IJ SOSY
40.0000 mg | PREFILLED_SYRINGE | Freq: Two times a day (BID) | INTRAMUSCULAR | Status: DC
  Administered 2024-05-15 – 2024-05-16 (×3): 40 mg via SUBCUTANEOUS
  Filled 2024-05-15 (×3): qty 0.4

## 2024-05-15 MED ORDER — SODIUM CHLORIDE 1 G PO TABS
1.0000 g | ORAL_TABLET | Freq: Two times a day (BID) | ORAL | Status: AC
  Administered 2024-05-15 – 2024-05-17 (×4): 1 g via ORAL
  Filled 2024-05-15 (×4): qty 1

## 2024-05-15 NOTE — Progress Notes (Addendum)
 [facility]   ROUNDING   NOTE CATIE CHIAO FMW:[id number]  DOB: Dec 03, 1945  DOA: 05/12/2024  PCP: [name], NP  05/15/2024,12:27 PM  LOS: 2 days    Code Status: Full code     from: Home   78 year old white female A-fib with tachybradycardia syndrome CHADVASC 6+ PPM-RF ablation 08/10/2013+ [facility] Jude Moderate CAD 70% RCA stenosis on cath 2014 medically treated HFpEF + pulmonary hypertension  hospitalization [facility] 6/1 through 11/03/2023 acute CHF exacerbation EF 55% HTN Multinodular left thyroid  goiter status post hemithyroidectomy 09/17/2022 on methimazole  Pulmonary nodules diagnosed 06/2023 6 mm stable from 2018  Rehospitalized 11/28-12/09/21/2023 SOB progressive discomfort urinary retention-thoracentesis X2 for pleural effusion-workup including rheumatological follow-up performed-fluid looked exudative  Workup so far negative  12/1 right heart cath mild pulmonary arterial hypertension with normal left-sided pressures and cardiac output no change in PVR and sildenafil  was increased to 3 times daily and told follow-up to discuss macitentan versus sotatercept with advanced heart failure team  12/10 presented back to the emergency room with urinary retention and shortness of breath?  In the setting of also constipation underlying and a Foley catheter was placed    Pertinent imaging/studies till date  Sodium 126 potassium 3.3 BUN/creatinine 12/0.6 AST/ALT 16/16 bilirubin 1.0 WBC 15.6 platelet 551 hemoglobin 13.2 Troponin trend flat CXR progressive right basilar consolidation and enlarging right pleural effusion  12/11 thoracentesis 800 cc--CT chest increasing right pulmonary effusions no findings of nodularity to suggest malignant effusion right lower lobe collapse consolidation felt secondary to atelectasis bilateral pulmonary nodules 8 mm?  Benign mild right hilar adenopathy likely benign 12/12 barium swallow unable to swallow barium tablet 13 mm spit up x 2 significantly limited  study----chest x-ray persistent layering effusion underlying infiltrate not excluded    Assessment  & Plan :    Recurrent urinary retention possibly secondary to constipation Promote good regimen MiraLAX  17 twice daily add senna at bedtime if no stool Foley catheter placed 12/10, continues Flomax  0.4--- voiding trial and remove catheter if possible  Hypovolemic hyponatremia on admission-serum osmolality 271 urine osmolality 347 urine sodium less than 30 She is losing weight, weight is down from 46 to 42 kg and now is 40-I stopped her Lasix  40 twice daily  Await labs from today and adjust meds etc ADDEND--- because of poor p.o. intake and nausea I will start fluids at 100 cc 8/8, start salt tabs for 4 doses and recheck urine sodium and urine osmolality in the morning--liberalize diet to regular with 1800 cc restriction-Hopefully fluid challenge will correct abnormalities-if not nephrology may need to be consulted?  Tolvaptan  chronic diastolic heart failure-do not think acute component Underlying mild pulmonary arterial hypertension Continue only sildenafil  40 3 times daily--- holding at this time Aldactone  25 Lasix  40 twice daily  Large right-sided pleural effusion-exudative nature based on recent lab tests Concern from my perspective of cryptogenic pleural effusion-I do not think this is autoimmune and I am not really sure with hypovolemia and loss of weight that she is volume overloaded Her neutrophil count on the fluid percentage is 65% raising concern for inflammatory process Lab review does not suggest autoimmune process (DRV TT negative anti-Jo beta-2  glycoprotein Lupus anticoagulant cardiolipin Rheum factor all normal )---  Path review of fluid was reactive--no malignant cells Prior steroids with tapering doses until 12/10 at last hospitalization ---hold abx and all steroids Defer to pulmonary who have seen the patient-follow CCP//ID cytology -- tissue biopsy needed probably to  determine etiology of what is  going on I am not confident this is not malignancy-probably will require further diagnostic testing in hospital if she is willing  Dysphagia since thyroidectomy -recent endoscopy 06/30/2023 by Dr. Cindie showed gastric polyps which were negative for malignancy but on PPI 40 twice daily which she is taking----adding Carafate 1 g 3 times daily as well as Pepcid and see if this helps the issue Had a very limited study normal with the barium swallow and was not able to swallow tablet  Permanent A-fib CHADVASC 6 [facility] Jude PPM on Eliquis  Eliquis  has been held on admission  Will place on subcu Lovenox  therapeutic dosing  Hyperthyroidism with multinodular goiter status post thyroidectomy 09/2022 on methimazole  Continuing Tapazole  20 daily-outpatient follow-up-would not workup further in hospital  Moderate CAD 2014 medically treatment Continues above meds   Data Reviewed today:  Labs pending this morning  DVT prophylaxis: Eliquis  has been held SCDs for now  Status is: Inpatient Inpatient pending resolution and pulmonary discussion    Dispo/Global plan: Inpatient   Time 60   Subjective:   Seen with daughter at bedside dysphagia is better shortness of breath better she is feeling overall little better less despondent and wishes to pursue therapeutic course of action I spent a reasonable amount of time with patient and daughter going over what a hospice trajectory would look like and what a curative trajectory look like-right now she wishes to get treatment I also spoke with Dr. Jude this morning who was about to walk into the room and he will offer their thoughts from a pulmonary perspective   Objective + exam Vitals:   05/14/24 1952 05/15/24 0526 05/15/24 0527 05/15/24 0801  BP: (!) 127/54 (!) 99/59  (!) 117/54  Pulse: 88 68 65 64  Resp: 18 17 18 18   Temp: 97.9 F (36.6 C) 98.3 F (36.8 C)  98.4 F (36.9 C)  TempSrc: Oral Oral Oral Oral  SpO2: 94%  94% 94% 93%  Weight:      Height:       Filed Weights   05/12/24 1503 05/13/24 1836  Weight: 42.2 kg 40.8 kg     Examination:  Patient seems brighter this morning does not seem to be distressed EOMI NCAT no focal deficits frail cachectic white female no distress ROM intact Decreased air entry posterolaterally on the right side Abdomen slight distention no rebound no guarding Edema no  Scheduled Meds:  Chlorhexidine  Gluconate Cloth  6 each Topical Daily   methimazole   20 mg Oral Daily   mouth rinse  15 mL Mouth Rinse 4 times per day   mouth rinse  15 mL Mouth Rinse 4 times per day   pantoprazole   40 mg Oral BID   polyethylene glycol  17 g Oral BID   senna-docusate  1 tablet Oral QHS   sildenafil   40 mg Oral TID   tiZANidine   2 mg Oral QHS   Continuous Infusions: acetaminophen  **OR** acetaminophen , albuterol , fluticasone , ondansetron  **OR** ondansetron  (ZOFRAN ) IV, mouth rinse, mouth rinse, sodium chloride , traMADol   Jai-Gurmukh Naoki Migliaccio, MD  [facility]

## 2024-05-15 NOTE — Progress Notes (Signed)
 This note is used to comply with regulatory documentation for home oxygen)  Patient Saturations on Room Air at Rest = 97%  Patient Saturations on Room Air while Ambulating = 94-96%

## 2024-05-15 NOTE — Progress Notes (Signed)
 PHARMACY - ANTICOAGULATION CONSULT NOTE  Pharmacy Consult for Lovenox  Indication: atrial fibrillation  Allergies[1]  Patient Measurements: Height: 4' 11 (149.9 cm) Weight: 40.8 kg (90 lb) IBW/kg (Calculated) : 43.2 HEPARIN  DW (KG): 42.2  Vital Signs: Temp: 98.4 F (36.9 C) (12/13 0801) Temp Source: Oral (12/13 0801) BP: 117/54 (12/13 0801) Pulse Rate: 64 (12/13 0801)  Labs: Recent Labs    05/12/24 1558 05/12/24 2040 05/13/24 0534 05/14/24 0445 05/15/24 1157  HGB 13.2  --  11.8* 12.1 11.5*  HCT 41.1  --  36.9 37.8 35.9*  PLT 551*  --  523* 495* 458*  LABPROT  --   --  19.2*  --   --   INR  --   --  1.5*  --   --   CREATININE 0.68  --  0.65 0.80 0.85  TROPONINIHS 10 10  --   --   --     Estimated Creatinine Clearance: 34.6 mL/min (by C-G formula based on SCr of 0.85 mg/dL).   Medical History: Past Medical History:  Diagnosis Date   Acute on chronic diastolic CHF (congestive heart failure), NYHA class 1 (HCC) 04/29/2013   Allergic rhinitis    Brady-tachy syndrome (HCC)    s/p STJ dual chamber PPM by Dr Francyne   CAD (coronary artery disease)    70% RCA stenosis, treated medically   Control of atrial fibrillation with pacemaker (HCC)    Diabetes mellitus (HCC)    type 2   DJD (degenerative joint disease)    Dyspnea    occasional   Hearing loss    Right ear only - no hearing aids   Hypertension    Hyperthyroidism    Paroxysmal atrial fibrillation (HCC)    Pacemaker St Jude   Pneumonia    years ago- before 2012 per patient   Polio    as a child   PONV (postoperative nausea and vomiting)    Presence of permanent cardiac pacemaker    St Jude pacemaker   Pulmonary disease    Sleep apnea    uses  CPAP nightly    Medications:  Medications Prior to Admission  Medication Sig Dispense Refill Last Dose/Taking   acetaminophen  (TYLENOL ) 500 MG tablet Take 1,000 mg by mouth daily as needed for mild pain (pain score 1-3) or moderate pain (pain score 4-6).    Past Week   Cholecalciferol  (VITAMIN D3) 1000 UNITS CAPS Take 1,000 Units by mouth in the morning.   05/12/2024   ELIQUIS  5 MG TABS tablet TAKE ONE TABLET BY MOUTH TWICE DAILY 60 tablet 5 05/12/2024 Morning   fluticasone  (FLONASE ) 50 MCG/ACT nasal spray Place 2 sprays into both nostrils daily as needed for allergies or rhinitis. (Patient taking differently: Place 2 sprays into both nostrils daily.) 16 g 6 05/12/2024   furosemide  (LASIX ) 40 MG tablet Take 1 tablet (40 mg total) by mouth 2 (two) times daily.   05/12/2024   loperamide  (IMODIUM  A-D) 2 MG tablet Take 2 mg by mouth daily as needed for diarrhea or loose stools.   Unknown   loratadine  (CLARITIN ) 10 MG tablet Take 1 tablet (10 mg total) by mouth daily. 30 tablet 6 05/12/2024   methimazole  (TAPAZOLE ) 10 MG tablet Take 20 mg (two tablets) daily.   05/12/2024   ondansetron  (ZOFRAN ) 4 MG tablet TAKE ONE TABLET BY MOUTH EVERY EIGHT HOURS AS NEEDED FOR NAUSEA OR VOMITING 20 tablet 0 05/12/2024   pantoprazole  (PROTONIX ) 40 MG tablet TAKE ONE TABLET BY MOUTH TWICE  DAILY BEFORE A MEAL (Patient taking differently: Take 40 mg by mouth daily.) 60 tablet 5 05/12/2024   polyethylene glycol powder (GLYCOLAX /MIRALAX ) 17 GM/SCOOP powder Take 17 g by mouth 2 (two) times daily. Dissolve 1 capful (17g) in 4-8 ounces of liquid and take by mouth daily. (Patient taking differently: Take 17 g by mouth daily as needed for mild constipation, moderate constipation or severe constipation.) 238 g 0 Unknown   potassium chloride  SA (KLOR-CON  M) 20 MEQ tablet Take 20 mEq by mouth daily.   05/12/2024   senna-docusate (SENOKOT-S) 8.6-50 MG tablet Take 2 tablets by mouth 2 (two) times daily. (Patient taking differently: Take 1-2 tablets by mouth daily as needed for mild constipation or moderate constipation.) 120 tablet 0 Unknown   sildenafil  (REVATIO ) 20 MG tablet Take 2 tablets (40 mg total) by mouth 3 (three) times daily. 180 tablet 2 05/12/2024   spironolactone  (ALDACTONE )  25 MG tablet Take 25 mg by mouth daily.   05/12/2024   tamsulosin  (FLOMAX ) 0.4 MG CAPS capsule Take 1 capsule (0.4 mg total) by mouth daily. 30 capsule 0 05/12/2024   tiZANidine  (ZANAFLEX ) 2 MG tablet Take 2 mg by mouth at bedtime.   Past Week   traMADol  (ULTRAM ) 50 MG tablet Take 50 mg by mouth daily as needed for moderate pain (pain score 4-6) or severe pain (pain score 7-10).   Past Week   albuterol  (VENTOLIN  HFA) 108 (90 Base) MCG/ACT inhaler Inhale 2 puffs into the lungs every 6 (six) hours as needed for wheezing or shortness of breath. (Patient not taking: Reported on 05/13/2024) 8 g 1 Not Taking   [EXPIRED] predniSONE  (DELTASONE ) 10 MG tablet Take 2 tablets (20 mg total) by mouth daily with breakfast for 3 days, THEN 1 tablet (10 mg total) daily with breakfast for 3 days. (Patient not taking: Reported on 05/13/2024) 9 tablet 0 Not Taking   Scheduled:   Chlorhexidine  Gluconate Cloth  6 each Topical Daily   enoxaparin  (LOVENOX ) injection  40 mg Subcutaneous Q12H   methimazole   20 mg Oral Daily   mouth rinse  15 mL Mouth Rinse 4 times per day   mouth rinse  15 mL Mouth Rinse 4 times per day   pantoprazole   40 mg Oral BID   polyethylene glycol  17 g Oral BID   senna-docusate  1 tablet Oral QHS   sildenafil   40 mg Oral TID   tiZANidine   2 mg Oral QHS   Infusions:   Assessment: Pt has been on apixaban  prior to admission for AF. It has been on hold for procedures. S/p thoracentesis 12/11. Order to bridge with lovenox .  Lovenox  was held yesterday afternoon, pharmacy now asked to resume.   CBC ok Scr<1  Goal of Therapy:  Anti-Xa level 0.6-1 units/ml 4hrs after LMWH dose given Monitor platelets by anticoagulation protocol: Yes   Plan:  Lovenox  40mg  SQ BID F/u resume apixaban   Harlene Barlow, Berdine BIRCH, BCPS, BCCP Clinical Pharmacist  05/15/2024 1:13 PM   [facility] pharmacy phone numbers are listed on [email or web address]         [1]  Allergies Allergen Reactions   Codeine Nausea And  Vomiting   Ketorolac  Nausea And Vomiting and Other (See Comments)    pt told to never take this med   Meperidine  Nausea Only and Nausea And Vomiting   Multaq  [Dronedarone ] Other (See Comments)    Increased fluid retention   Xarelto [Rivaroxaban] Diarrhea   Flexeril  [Cyclobenzaprine ] Nausea And Vomiting   Latex Rash  Pt stated on 09/20/2022

## 2024-05-15 NOTE — Consult Note (Signed)
 NAME:  Hailey [name], MRN:  [id number], DOB:  04-14-1946, LOS: 2 ADMISSION DATE:  05/12/2024, CONSULTATION DATE:  05/15/2024 REFERRING MD:  Royal Sill, MD, CHIEF COMPLAINT:  recurrent pleural effusion   History of Present Illness:   Hailey [name] is a 78 y.o. woman with extensive past medical history  In November she was admitted with worsening shortness of breath and was found to have right pleural effusion for which she underwent thoracentesis with IR x 2.  This was exudative based on LDH.  She was diuresed with IV Lasix .  She also has secondary pulmonary hypertension and is on sildenafil  per Dr. Bensimhon.  During this admission she had autoimmune testing done due to uncertainty about the nature of the effusion.  Her ANA was positive.  Complement levels were normal.  Her ESR and CRP were elevated.  She also had right heart catheterization this admission which showed normal left-sided lung pressures and cardiac output.  The plan had been to follow-up as an outpatient to discuss additional pulmonary vasodilators given her mild elevation in PVR despite sildenafil  therapy.  She was started on empiric steroid therapy with prednisone  and told to follow-up with rheumatology as an outpatient for ongoing management of the effusion.  He was recommended be discharged to SNF but she declined and went home.  She presents now again with worsening shortness of breath, abdominal distention, effusion.  Pulmonary being consulted for management of the right pleural effusion.  Of note note , MBS performed last admission- no aspiration.   She has not yet seen rheumatology.    Pertinent  Medical History   type 2 diabetes,  coronary artery disease,  chronic diastolic heart failure,  permanent atrial fibrillation status post pacemaker,  hypothyroidism.  Significant Hospital Events: Including procedures, antibiotic start and stop dates in addition to other pertinent events   11/28 Thora 1.1 L   12/1 Thora 900 cc 12/11 Thora 800 cc  Interim History / Subjective:   No chest pain or dyspnea  Objective    Blood pressure (!) 117/54, pulse 64, temperature 98.4 F (36.9 C), temperature source Oral, resp. rate 18, height 4' 11 (1.499 m), weight 40.8 kg, SpO2 93%.        Intake/Output Summary (Last 24 hours) at 05/15/2024 1534 Last data filed at 05/15/2024 0802 Gross per 24 hour  Intake 360 ml  Output 250 ml  Net 110 ml   Filed Weights   05/12/24 1503 05/13/24 1836  Weight: 42.2 kg 40.8 kg    Examination: Frail elderly woman on nasal cannula, no distress S1-S2 regular Decreased breath sounds on right, no accessory muscle use No peripheral edema  Pleural fluid from 12/11 -LDH decreased to 74, protein 4.0, 322 cells 65% neutrophils  12/1 Cell count 5100 TNC 80% neutrophil, although there was fibrin clumping Before that 11/28 7300, monocyte predominant Chest xray 12/13 shows reaccumulation of right pleural fluid  Labs show hyponatremia, normal renal function, mild persistent leukocytosis  Resolved problem list   Assessment and Plan   Recurrent right pleural effusion +ANA, elevated inflammatory markers Acute on chronic HFpEF Pulmonary hypertension Group 1-2 -echo showed severely elevated RVSP 81 however RHC 12/1 showed much improved PA pressures 44/22 and PCW 11 Atrial fibrillation s/p ppm  Differential diagnosis for the effusion is possible autoimmune mediated vs pseudoexudative from chronic chf, less likely malignancy, previous cytology negative.  Very unlikely parapneumonic at this point The pleural fluid studies are decidedly less inflammatory from the previous two collections.  RA factor is negative, serum CCP pending.  RF pleural fluid has been sent out to [facility].   I agree that this is most likely pseudoexudative-protein is falsely elevated  I discussed various options with the patient and her daughter at the bedside - VATS pleural biopsy with  mechanical pleurodesis -high surgical morbidity - Pigtail placement with talc  pleurodesis, understanding that we do not have definitive diagnosis - PleurX catheter   They will discuss and get back to us  by tomorrow  Harden GAILS. Jude MD Pulmonary and Critical Care Medicine [facility] 05/15/2024 3:34 PM Pager: see AMION  If no response to pager, please call critical care on call (see AMION) until 7pm After 7:00 pm call Elink      Labs   CBC: Recent Labs  Lab 05/12/24 1558 05/13/24 0534 05/14/24 0445 05/15/24 1157  WBC 15.6* 12.8* 12.4* 13.6*  NEUTROABS  --   --  10.2* 11.5*  HGB 13.2 11.8* 12.1 11.5*  HCT 41.1 36.9 37.8 35.9*  MCV 82.0 81.6 80.6 81.0  PLT 551* 523* 495* 458*    Basic Metabolic Panel: Recent Labs  Lab 05/12/24 1558 05/13/24 0534 05/14/24 0445 05/15/24 1157  NA 126* 127* 126* 126*  K 3.3* 3.7 3.3* 3.5  CL 84* 85* 85* 87*  CO2 30 32 30 30  GLUCOSE 192* 106* 115* 99  BUN 12 11 10 8   CREATININE 0.68 0.65 0.80 0.85  CALCIUM  8.3* 8.4* 7.9* 7.9*   GFR: Estimated Creatinine Clearance: 34.6 mL/min (by C-G formula based on SCr of 0.85 mg/dL). Recent Labs  Lab 05/12/24 1558 05/13/24 0534 05/14/24 0445 05/15/24 1157  WBC 15.6* 12.8* 12.4* 13.6*    Liver Function Tests: Recent Labs  Lab 05/12/24 1558 05/14/24 0445 05/15/24 1157  AST 16 14* 17  ALT 16 13 10   ALKPHOS 118 100 97  BILITOT 1.0 1.3* 1.2  PROT 6.6 5.4* 5.3*  ALBUMIN  3.2* 2.5* 2.5*   No results for input(s): LIPASE, AMYLASE in the last 168 hours. No results for input(s): AMMONIA in the last 168 hours.  ABG    Component Value Date/Time   PHART 7.415 (H) 11/25/2010 1435   PCO2ART 38.8 11/25/2010 1435   PO2ART 67.4 (L) 11/25/2010 1435   HCO3 33.7 (H) 05/03/2024 1154   TCO2 35 (H) 05/03/2024 1154   O2SAT 75 05/03/2024 1154     Coagulation Profile: Recent Labs  Lab 05/13/24 0534  INR 1.5*    Cardiac Enzymes: No results for input(s): CKTOTAL, CKMB,  CKMBINDEX, TROPONINI in the last 168 hours.  HbA1C: Hgb A1c MFr Bld  Date/Time Value Ref Range Status  05/01/2024 09:35 AM 6.5 (H) 4.8 - 5.6 % Final    Comment:    (NOTE)         Prediabetes: 5.7 - 6.4         Diabetes: >6.4         Glycemic control for adults with diabetes: <7.0   05/13/2023 02:18 PM 7.3 (H) <5.7 % of total Hgb Final    Comment:    For someone without known diabetes, a hemoglobin A1c value of 6.5% or greater indicates that they may have  diabetes and this should be confirmed with a follow-up  test. . For someone with known diabetes, a value <7% indicates  that their diabetes is well controlled and a value  greater than or equal to 7% indicates suboptimal  control. A1c targets should be individualized based on  duration of diabetes, age, comorbid conditions, and  other considerations. . Currently, no consensus exists regarding use of hemoglobin A1c for diagnosis of diabetes for children. .     CBG: No results for input(s): GLUCAP in the last 168 hours.

## 2024-05-16 DIAGNOSIS — Z9889 Other specified postprocedural states: Secondary | ICD-10-CM | POA: Diagnosis not present

## 2024-05-16 DIAGNOSIS — E1159 Type 2 diabetes mellitus with other circulatory complications: Secondary | ICD-10-CM | POA: Diagnosis not present

## 2024-05-16 DIAGNOSIS — I272 Pulmonary hypertension, unspecified: Secondary | ICD-10-CM | POA: Diagnosis not present

## 2024-05-16 DIAGNOSIS — G4730 Sleep apnea, unspecified: Secondary | ICD-10-CM | POA: Diagnosis not present

## 2024-05-16 DIAGNOSIS — I5033 Acute on chronic diastolic (congestive) heart failure: Secondary | ICD-10-CM | POA: Diagnosis not present

## 2024-05-16 DIAGNOSIS — I110 Hypertensive heart disease with heart failure: Secondary | ICD-10-CM | POA: Diagnosis not present

## 2024-05-16 DIAGNOSIS — I495 Sick sinus syndrome: Secondary | ICD-10-CM | POA: Diagnosis not present

## 2024-05-16 DIAGNOSIS — J90 Pleural effusion, not elsewhere classified: Secondary | ICD-10-CM | POA: Diagnosis not present

## 2024-05-16 DIAGNOSIS — I2510 Atherosclerotic heart disease of native coronary artery without angina pectoris: Secondary | ICD-10-CM | POA: Diagnosis not present

## 2024-05-16 DIAGNOSIS — E039 Hypothyroidism, unspecified: Secondary | ICD-10-CM | POA: Diagnosis not present

## 2024-05-16 DIAGNOSIS — I4891 Unspecified atrial fibrillation: Secondary | ICD-10-CM | POA: Diagnosis not present

## 2024-05-16 DIAGNOSIS — R531 Weakness: Secondary | ICD-10-CM | POA: Diagnosis not present

## 2024-05-16 LAB — CBC WITH DIFFERENTIAL/PLATELET
Abs Immature Granulocytes: 0.03 K/uL (ref 0.00–0.07)
Basophils Absolute: 0 K/uL (ref 0.0–0.1)
Basophils Relative: 0 %
Eosinophils Absolute: 0.1 K/uL (ref 0.0–0.5)
Eosinophils Relative: 1 %
HCT: 32.3 % — ABNORMAL LOW (ref 36.0–46.0)
Hemoglobin: 10.2 g/dL — ABNORMAL LOW (ref 12.0–15.0)
Immature Granulocytes: 0 %
Lymphocytes Relative: 11 %
Lymphs Abs: 1 K/uL (ref 0.7–4.0)
MCH: 25.6 pg — ABNORMAL LOW (ref 26.0–34.0)
MCHC: 31.6 g/dL (ref 30.0–36.0)
MCV: 81.2 fL (ref 80.0–100.0)
Monocytes Absolute: 0.7 K/uL (ref 0.1–1.0)
Monocytes Relative: 8 %
Neutro Abs: 7.1 K/uL (ref 1.7–7.7)
Neutrophils Relative %: 80 %
Platelets: 425 K/uL — ABNORMAL HIGH (ref 150–400)
RBC: 3.98 MIL/uL (ref 3.87–5.11)
RDW: 15.5 % (ref 11.5–15.5)
WBC: 8.9 K/uL (ref 4.0–10.5)
nRBC: 0 % (ref 0.0–0.2)

## 2024-05-16 LAB — BASIC METABOLIC PANEL WITH GFR
Anion gap: 7 (ref 5–15)
BUN: 6 mg/dL — ABNORMAL LOW (ref 8–23)
CO2: 27 mmol/L (ref 22–32)
Calcium: 7.6 mg/dL — ABNORMAL LOW (ref 8.9–10.3)
Chloride: 94 mmol/L — ABNORMAL LOW (ref 98–111)
Creatinine, Ser: 0.61 mg/dL (ref 0.44–1.00)
GFR, Estimated: >60 mL/min (ref >60)
Glucose, Bld: 86 mg/dL (ref 70–99)
Potassium: 3.1 mmol/L — ABNORMAL LOW (ref 3.5–5.1)
Sodium: 128 mmol/L — ABNORMAL LOW (ref 135–145)

## 2024-05-16 LAB — OSMOLALITY, URINE: Osmolality, Ur: 324 mosm/kg (ref 300–900)

## 2024-05-16 LAB — SODIUM, URINE, RANDOM: Sodium, Ur: <30 mmol/L

## 2024-05-16 LAB — CYCLIC CITRUL PEPTIDE ANTIBODY, IGG/IGA: CCP Antibodies IgG/IgA: 9 U (ref 0–19)

## 2024-05-16 MED ORDER — ENOXAPARIN SODIUM 40 MG/0.4ML IJ SOSY
40.0000 mg | PREFILLED_SYRINGE | Freq: Two times a day (BID) | INTRAMUSCULAR | Status: DC
  Administered 2024-05-17 (×2): 40 mg via SUBCUTANEOUS
  Filled 2024-05-16 (×2): qty 0.4

## 2024-05-16 MED ORDER — POTASSIUM CHLORIDE CRYS ER 20 MEQ PO TBCR
20.0000 meq | EXTENDED_RELEASE_TABLET | Freq: Every day | ORAL | Status: DC
  Administered 2024-05-16 – 2024-05-17 (×2): 20 meq via ORAL
  Filled 2024-05-16 (×2): qty 1

## 2024-05-16 MED ORDER — FUROSEMIDE 40 MG PO TABS
40.0000 mg | ORAL_TABLET | Freq: Two times a day (BID) | ORAL | Status: DC
  Administered 2024-05-16 – 2024-05-23 (×13): 40 mg via ORAL
  Filled 2024-05-16 (×14): qty 1

## 2024-05-16 MED ORDER — MELATONIN 3 MG PO TABS
3.0000 mg | ORAL_TABLET | Freq: Once | ORAL | Status: AC | PRN
  Administered 2024-05-16: 3 mg via ORAL
  Filled 2024-05-16: qty 1

## 2024-05-16 MED ORDER — SPIRONOLACTONE 25 MG PO TABS
25.0000 mg | ORAL_TABLET | Freq: Every day | ORAL | Status: DC
  Administered 2024-05-16 – 2024-05-23 (×8): 25 mg via ORAL
  Filled 2024-05-16 (×8): qty 1

## 2024-05-16 NOTE — Progress Notes (Signed)
 [facility] night cross cover note:    Patient feeling lower abdominal discomfort, distention, unable to void, with bladder scan revealing 265 cc.  Patient requests placement of Foley catheter at this time as opposed to In-N-Out cath.  Per patient request, I have placed order for placement of Foley catheter at this time.   Eva Pore, DO Hospitalist

## 2024-05-16 NOTE — Progress Notes (Signed)
 NAMEJESLIE [name], MRN:  [id number], DOB:  Nov 24, 1945, LOS: 3 ADMISSION DATE:  05/12/2024, CONSULTATION DATE:  05/16/2024 REFERRING MD:  Jonel Lonni SQUIBB, *, CHIEF COMPLAINT:  recurrent pleural effusion   History of Present Illness:   Hailey [name] is a 78 y.o. woman with extensive past medical history  In November she was admitted with worsening shortness of breath and was found to have right pleural effusion for which she underwent thoracentesis with IR x 2.  This was exudative based on LDH.  She was diuresed with IV Lasix .  She also has secondary pulmonary hypertension and is on sildenafil  per Dr. Bensimhon.  During this admission she had autoimmune testing done due to uncertainty about the nature of the effusion.  Her ANA was positive.  Complement levels were normal.  Her ESR and CRP were elevated.  She also had right heart catheterization this admission which showed normal left-sided lung pressures and cardiac output.  The plan had been to follow-up as an outpatient to discuss additional pulmonary vasodilators given her mild elevation in PVR despite sildenafil  therapy.  She was started on empiric steroid therapy with prednisone  and told to follow-up with rheumatology as an outpatient for ongoing management of the effusion.  He was recommended be discharged to SNF but she declined and went home.  She presents now again with worsening shortness of breath, abdominal distention, effusion.  Pulmonary being consulted for management of the right pleural effusion.  Of note note , MBS performed last admission- no aspiration.   She has not yet seen rheumatology.    Pertinent  Medical History   type 2 diabetes,  coronary artery disease,  chronic diastolic heart failure,  permanent atrial fibrillation status post pacemaker,  hypothyroidism.  Significant Hospital Events: Including procedures, antibiotic start and stop dates in addition to other pertinent events   11/28 Thora 1.1 L   12/1 Thora 900 cc 12/11 Thora 800 cc  Interim History / Subjective:   Denies chest pain or dyspnea Did not desaturate on ambulation  Objective    Blood pressure (!) 110/56, pulse 66, temperature 97.9 F (36.6 C), temperature source Oral, resp. rate 18, height 4' 11 (1.499 m), weight 40.8 kg, SpO2 95%.        Intake/Output Summary (Last 24 hours) at 05/16/2024 9047 Last data filed at 05/16/2024 0854 Gross per 24 hour  Intake 1781.34 ml  Output 625 ml  Net 1156.34 ml   Filed Weights   05/12/24 1503 05/13/24 1836  Weight: 42.2 kg 40.8 kg    Examination: Frail elderly woman, no distress Out of bed to chair , appears much improved S1-S2 regular No accessory muscle use, decreased breath sounds on right No peripheral edema  Pleural fluid from 12/11 -LDH decreased to 74, protein 4.0, 322 cells 65% neutrophils  12/1 Cell count 5100 TNC 80% neutrophil, although there was fibrin clumping Before that 11/28 7300, monocyte predominant Chest xray 12/13 shows reaccumulation of right pleural fluid  Labs show hyponatremia, hypokalemia, improved leukocytosis  Resolved problem list   Assessment and Plan   Recurrent right pleural effusion +ANA 1:160 homogeneous, negative RA factor, negative dsDNA, negative CCP Acute on chronic HFpEF Pulmonary hypertension Group 1-2 -echo showed severely elevated RVSP 81 however RHC 12/1 showed much improved PA pressures 44/22 and PCW 11 Atrial fibrillation s/p ppm  Differential diagnosis for the effusion is pseudoexudative from chronic chf, less likely malignancy, previous cytology x 2 negative, pending from 12/2 .  less likely parapneumonic  at this point The pleural fluid studies are decidedly less inflammatory from the previous two collections.     I discussed various options with the patient and her daughter at the bedside - VATS pleural biopsy with mechanical pleurodesis -risk of surgical morbidity - Pigtail placement with talc   pleurodesis, understanding that we do not have definitive diagnosis - PleurX catheter   Patient and daughter are discussed and they prefer surgical option, would involve T CTS for opinion.  She would like a definitive resolution of this problem  Fendi Meinhardt V. Jude MD Pulmonary and Critical Care Medicine [facility] [place] 05/16/2024 9:52 AM Pager: see AMION  If no response to pager, please call critical care on call (see AMION) until 7pm After 7:00 pm call Elink      Labs   CBC: Recent Labs  Lab 05/12/24 1558 05/13/24 0534 05/14/24 0445 05/15/24 1157 05/16/24 0623  WBC 15.6* 12.8* 12.4* 13.6* 8.9  NEUTROABS  --   --  10.2* 11.5* 7.1  HGB 13.2 11.8* 12.1 11.5* 10.2*  HCT 41.1 36.9 37.8 35.9* 32.3*  MCV 82.0 81.6 80.6 81.0 81.2  PLT 551* 523* 495* 458* 425*    Basic Metabolic Panel: Recent Labs  Lab 05/12/24 1558 05/13/24 0534 05/14/24 0445 05/15/24 1157 05/16/24 0623  NA 126* 127* 126* 126* 128*  K 3.3* 3.7 3.3* 3.5 3.1*  CL 84* 85* 85* 87* 94*  CO2 30 32 30 30 27   GLUCOSE 192* 106* 115* 99 86  BUN 12 11 10 8  6*  CREATININE 0.68 0.65 0.80 0.85 0.61  CALCIUM  8.3* 8.4* 7.9* 7.9* 7.6*   GFR: Estimated Creatinine Clearance: 36.7 mL/min (by C-G formula based on SCr of 0.61 mg/dL). Recent Labs  Lab 05/13/24 0534 05/14/24 0445 05/15/24 1157 05/16/24 0623  WBC 12.8* 12.4* 13.6* 8.9    Liver Function Tests: Recent Labs  Lab 05/12/24 1558 05/14/24 0445 05/15/24 1157  AST 16 14* 17  ALT 16 13 10   ALKPHOS 118 100 97  BILITOT 1.0 1.3* 1.2  PROT 6.6 5.4* 5.3*  ALBUMIN  3.2* 2.5* 2.5*   No results for input(s): LIPASE, AMYLASE in the last 168 hours. No results for input(s): AMMONIA in the last 168 hours.  ABG    Component Value Date/Time   PHART 7.415 (H) 11/25/2010 1435   PCO2ART 38.8 11/25/2010 1435   PO2ART 67.4 (L) 11/25/2010 1435   HCO3 33.7 (H) 05/03/2024 1154   TCO2 35 (H) 05/03/2024 1154   O2SAT 75 05/03/2024 1154     Coagulation  Profile: Recent Labs  Lab 05/13/24 0534  INR 1.5*    Cardiac Enzymes: No results for input(s): CKTOTAL, CKMB, CKMBINDEX, TROPONINI in the last 168 hours.  HbA1C: Hgb A1c MFr Bld  Date/Time Value Ref Range Status  05/01/2024 09:35 AM 6.5 (H) 4.8 - 5.6 % Final    Comment:    (NOTE)         Prediabetes: 5.7 - 6.4         Diabetes: >6.4         Glycemic control for adults with diabetes: <7.0   05/13/2023 02:18 PM 7.3 (H) <5.7 % of total Hgb Final    Comment:    For someone without known diabetes, a hemoglobin A1c value of 6.5% or greater indicates that they may have  diabetes and this should be confirmed with a follow-up  test. . For someone with known diabetes, a value <7% indicates  that their diabetes is well controlled and a value  greater  than or equal to 7% indicates suboptimal  control. A1c targets should be individualized based on  duration of diabetes, age, comorbid conditions, and  other considerations. . Currently, no consensus exists regarding use of hemoglobin A1c for diagnosis of diabetes for children. .     CBG: No results for input(s): GLUCAP in the last 168 hours.

## 2024-05-16 NOTE — Consult Note (Cosign Needed Addendum)
 [address]       [place] [address]             [phone]        DANNA SEWELL [facility] Medical Record #[id number] Date of Birth: 1946/04/30  Referring: No ref. provider found Primary Care: Medina-Vargas, Jereld BROCKS, NP Primary Cardiologist:Mihai Croitoru, MD  Chief Complaint:    Chief Complaint  Patient presents with   Urinary Retention   History of Present Illness:     Lorice Lafave is a 78 year old woman with a past medical history of acute on chronic HFpEF, atrial fibrillation on eliquis , tachy-brady syndrome s/p PPM, CAD, T2DM, HTN, sleep apnea, hyperthyroidism with multinodular goiter s/p thyroidectomy 04/24 on methimazole , pulmonary hypertension, and polio as a child. She presented in November with worsening shortness of breath and underwent right thoracentesis with IR x 2 for recurrent right pleural effusion. This was found to be exudative based on LDH. During that admission she was found to be ANA+, ESR and CRP were elevated. Right heart catheterization showed normal left sided lung pressures and cardiac output, she remained on Sildenafil . She was started on steroid therapy with prednisone  and follow up appointment was made with rheumatology. She was discharged on 12/04 and declined SNF at discharge. She presented back to the hospital on 12/10 with weakness, urinary retention requiring foley catheter placement, and with shortness of breath with recurrence of right pleural effusion while on Lasix . She was found to be hyponatremic and hypokalemic upon arrival. She underwent a 3rd right sided thoracentesis 12/11 which yielded serous fluid. Cytologies have shown DRV TT negative, anti-Jo beta-2  glycoprotein, Lupus anticoagulant, and cardiolipin have all been normal and LDH and inflammatory markers over the last two fluid collections have been much lower. Serum CCP is pending and RF pleural fluid has been sent out to [facility]. Pathology review of fluid was reactive  without malignant cells. Her neutrophil count on the fluid is 65% and she is hypovolemic. It is felt this is possibly pseudoexudative from chronic CHF per PCCM.   On 12/12 the patient status was updated to DNR-I and the patient reported she did not want further treatment to the hospitalist. After discussion the daughter and patient feel they would like a definitive resolution. The patient lives with her husband who has Parkinson's dementia. Prior to her last admission she was able to perform ADLs without assistance.   Current Activity/ Functional Status: Patient was independent with mobility/ambulation, transfers, ADL's, IADL's.   Zubrod Score: At the time of surgery this patients most appropriate activity status/level should be described as: []     0    Normal activity, no symptoms []     1    Restricted in physical strenuous activity but ambulatory, able to do out light work [x]     2    Ambulatory and capable of self care, unable to do work activities, up and about                 more than 50%  Of the time                            [x]     3    Only limited self care, in bed greater than 50% of waking hours []     4    Completely disabled, no self care, confined to bed or chair []     5  Moribund  Past Medical History:  Diagnosis Date   Acute on chronic diastolic CHF (congestive heart failure), NYHA class 1 (HCC) 04/29/2013   Allergic rhinitis    Brady-tachy syndrome (HCC)    s/p STJ dual chamber PPM by Dr Francyne   CAD (coronary artery disease)    70% RCA stenosis, treated medically   Control of atrial fibrillation with pacemaker (HCC)    Diabetes mellitus (HCC)    type 2   DJD (degenerative joint disease)    Dyspnea    occasional   Hearing loss    Right ear only - no hearing aids   Hypertension    Hyperthyroidism    Paroxysmal atrial fibrillation (HCC)    Pacemaker St Jude   Pneumonia    years ago- before 2012 per patient   Polio    as a child   PONV (postoperative nausea  and vomiting)    Presence of permanent cardiac pacemaker    St Jude pacemaker   Pulmonary disease    Sleep apnea    uses  CPAP nightly    Past Surgical History:  Procedure Laterality Date   arm surgery d/t fx Right 1996   ATRIAL FIBRILLATION ABLATION  07/31/2012; 08-10-13   PVI by Dr Kelsie   ATRIAL FIBRILLATION ABLATION N/A 07/31/2012   Procedure: ATRIAL FIBRILLATION ABLATION;  Surgeon: Lynwood Kelsie, MD;  Location: [facility] CATH LAB;  Service: Cardiovascular;  Laterality: N/A;   ATRIAL FIBRILLATION ABLATION N/A 08/10/2013   Procedure: ATRIAL FIBRILLATION ABLATION;  Surgeon: Lynwood JONETTA Kelsie, MD;  Location: [facility] CATH LAB;  Service: Cardiovascular;  Laterality: N/A;   BACK SURGERY  07/26/2012   dR. nUDELMAN   BIOPSY  06/30/2023   Procedure: BIOPSY;  Surgeon: Cindie Carlin POUR, DO;  Location: [facility] ENDO SUITE;  Service: Endoscopy;;   CARDIAC CATHETERIZATION  12/03/2010   single vessel mid RCA   CARDIAC ELECTROPHYSIOLOGY MAPPING AND ABLATION  07/31/2012   Dr. Lynwood Kelsie   CESAREAN SECTION  09/20/1974   Dr. Lonni   CHOLECYSTECTOMY  1994   COLONOSCOPY WITH PROPOFOL  N/A 03/21/2015   Procedure: COLONOSCOPY WITH PROPOFOL ;  Surgeon: Renaye Sous, MD;  Location: [facility] ENDOSCOPY;  Service: Endoscopy;  Laterality: N/A;   COLONOSCOPY WITH PROPOFOL  N/A 06/30/2023   Procedure: COLONOSCOPY WITH PROPOFOL ;  Surgeon: Cindie Carlin POUR, DO;  Location: [facility] ENDO SUITE;  Service: Endoscopy;  Laterality: N/A;  11:15 AM, ASA 4   DIAGNOSTIC MAMMOGRAM  04/16/2017   Duwaine HERO. Morris, DO   ESOPHAGOGASTRODUODENOSCOPY (EGD) WITH PROPOFOL  N/A 06/30/2023   Procedure: ESOPHAGOGASTRODUODENOSCOPY (EGD) WITH PROPOFOL ;  Surgeon: Cindie Carlin POUR, DO;  Location: [facility] ENDO SUITE;  Service: Endoscopy;  Laterality: N/A;  11:15 AM, ASA 4   EYE SURGERY Bilateral    remove cataracts   IR THORACENTESIS ASP PLEURAL SPACE W/IMG GUIDE  04/30/2024   IR THORACENTESIS ASP PLEURAL SPACE W/IMG GUIDE  05/03/2024   IR THORACENTESIS ASP PLEURAL SPACE W/IMG  GUIDE  05/13/2024   LAPAROSCOPIC HYSTERECTOMY  Late 70s to early 80s   LEFT HEART CATH AND CORONARY ANGIOGRAPHY N/A 07/30/2017   Procedure: LEFT HEART CATH AND CORONARY ANGIOGRAPHY;  Surgeon: Burnard Debby LABOR, MD;  Location: [facility] INVASIVE CV LAB;  Service: Cardiovascular;  Laterality: N/A;   LUMBAR LAMINECTOMY/DECOMPRESSION MICRODISCECTOMY Right 12/23/2012   Procedure: Right L5-S1 Laminotomy for resection of synovial cyst;  Surgeon: Lamar LELON Peaches, MD;  Location: [facility] NEURO ORS;  Service: Neurosurgery;  Laterality: Right;  Right Lumbar five-sacral one Laminotomy for resection of synovial cyst  NM MYOCAR PERF WALL MOTION  12/20/2010   normal   PACEMAKER IMPLANT N/A 08/09/2019   Procedure: PACEMAKER IMPLANT;  Surgeon: Francyne Headland, MD;  Location: [facility] INVASIVE CV LAB;  Service: Cardiovascular;  Laterality: N/A;   PACEMAKER INSERTION  12/03/2010   SJM Accent DR RF implanted by Dr Francyne   POLYPECTOMY  06/30/2023   Procedure: POLYPECTOMY;  Surgeon: Cindie Carlin POUR, DO;  Location: [facility] ENDO SUITE;  Service: Endoscopy;;   RIGHT HEART CATH N/A 04/22/2018   Procedure: RIGHT HEART CATH;  Surgeon: Darron Deatrice LABOR, MD;  Location: [facility] INVASIVE CV LAB;  Service: Cardiovascular;  Laterality: N/A;   RIGHT HEART CATH N/A 07/09/2022   Procedure: RIGHT HEART CATH;  Surgeon: Cherrie Toribio SAUNDERS, MD;  Location: [facility] INVASIVE CV LAB;  Service: Cardiovascular;  Laterality: N/A;   RIGHT HEART CATH N/A 05/03/2024   Procedure: RIGHT HEART CATH;  Surgeon: Cherrie Toribio SAUNDERS, MD;  Location: [facility] INVASIVE CV LAB;  Service: Cardiovascular;  Laterality: N/A;   TEE WITHOUT CARDIOVERSION N/A 07/30/2012   Procedure: TRANSESOPHAGEAL ECHOCARDIOGRAM (TEE);  Surgeon: Headland Francyne, MD;  Location: [facility] ENDOSCOPY;  Service: Cardiovascular;  Laterality: N/A;  h&p in file-hope   TEE WITHOUT CARDIOVERSION N/A 08/09/2013   Procedure: TRANSESOPHAGEAL ECHOCARDIOGRAM (TEE);  Surgeon: Redell GORMAN Shallow, MD;  Location: [facility] ENDOSCOPY;  Service:  Cardiovascular;  Laterality: N/A;   THYROID  LOBECTOMY Right 09/20/2022   Procedure: RIGHT HEMITHYROIDECTOMY;  Surgeon: Llewellyn Gerard LABOR, DO;  Location: [facility] OR;  Service: ENT;  Laterality: Right;   TOTAL ABDOMINAL HYSTERECTOMY  1981   ULTRASOUND GUIDANCE FOR VASCULAR ACCESS  04/22/2018   Procedure: Ultrasound Guidance For Vascular Access;  Surgeon: Darron Deatrice LABOR, MD;  Location: [facility] INVASIVE CV LAB;  Service: Cardiovascular;;    Tobacco Use History[1]  Social History   Substance and Sexual Activity  Alcohol Use No   Allergies[2]  Current Facility-Administered Medications  Medication Dose Route Frequency Provider Last Rate Last Admin   0.9 %  sodium chloride  infusion   Intravenous Continuous Samtani, Jai-Gurmukh, MD 100 mL/hr at 05/16/24 1047 New Bag at 05/16/24 1047   acetaminophen  (TYLENOL ) tablet 650 mg  650 mg Oral Q6H PRN Dena Charleston, MD   650 mg at 05/14/24 2229   Or   acetaminophen  (TYLENOL ) suppository 650 mg  650 mg Rectal Q6H PRN Dena Charleston, MD       albuterol  (PROVENTIL ) (2.5 MG/3ML) 0.083% nebulizer solution 2.5 mg  2.5 mg Inhalation Q6H PRN Samtani, Jai-Gurmukh, MD       Chlorhexidine  Gluconate Cloth 2 % PADS 6 each  6 each Topical Daily Samtani, Jai-Gurmukh, MD   6 each at 05/16/24 0949   enoxaparin  (LOVENOX ) injection 40 mg  40 mg Subcutaneous Q12H Carney, Jessica C, RPH   40 mg at 05/16/24 0140   fluticasone  (FLONASE ) 50 MCG/ACT nasal spray 2 spray  2 spray Each Nare Daily PRN Samtani, Jai-Gurmukh, MD       methimazole  (TAPAZOLE ) tablet 20 mg  20 mg Oral Daily Dorrell, Robert, MD   20 mg at 05/16/24 9050   ondansetron  (ZOFRAN ) tablet 4 mg  4 mg Oral Q6H PRN Dena Charleston, MD       Or   ondansetron  (ZOFRAN ) injection 4 mg  4 mg Intravenous Q6H PRN Dena Charleston, MD   4 mg at 05/15/24 0912   Oral care mouth rinse  15 mL Mouth Rinse PRN Samtani, Jai-Gurmukh, MD       Oral care mouth rinse  15 mL Mouth Rinse 4  times per day Samtani, Jai-Gurmukh, MD   15 mL at  05/15/24 1300   Oral care mouth rinse  15 mL Mouth Rinse PRN Samtani, Jai-Gurmukh, MD       Oral care mouth rinse  15 mL Mouth Rinse 4 times per day Samtani, Jai-Gurmukh, MD   15 mL at 05/16/24 9050   pantoprazole  (PROTONIX ) EC tablet 40 mg  40 mg Oral BID Dorrell, Robert, MD   40 mg at 05/16/24 0948   polyethylene glycol (MIRALAX  / GLYCOLAX ) packet 17 g  17 g Oral BID Samtani, Jai-Gurmukh, MD   17 g at 05/16/24 0948   senna-docusate (Senokot-S) tablet 1 tablet  1 tablet Oral QHS Samtani, Jai-Gurmukh, MD   1 tablet at 05/15/24 2357   sildenafil  (REVATIO ) tablet 40 mg  40 mg Oral TID Dorrell, Robert, MD   40 mg at 05/16/24 9051   sodium chloride  (OCEAN) 0.65 % nasal spray 1 spray  1 spray Nasal PRN Samtani, Jai-Gurmukh, MD       sodium chloride  tablet 1 g  1 g Oral BID WC Samtani, Jai-Gurmukh, MD   1 g at 05/16/24 0948   tiZANidine  (ZANAFLEX ) tablet 2 mg  2 mg Oral QHS Samtani, Jai-Gurmukh, MD   2 mg at 05/15/24 2248   traMADol  (ULTRAM ) tablet 50-100 mg  50-100 mg Oral Q8H PRN Samtani, Jai-Gurmukh, MD   50 mg at 05/15/24 1730    Medications Prior to Admission  Medication Sig Dispense Refill Last Dose/Taking   acetaminophen  (TYLENOL ) 500 MG tablet Take 1,000 mg by mouth daily as needed for mild pain (pain score 1-3) or moderate pain (pain score 4-6).   Past Week   Cholecalciferol  (VITAMIN D3) 1000 UNITS CAPS Take 1,000 Units by mouth in the morning.   05/12/2024   ELIQUIS  5 MG TABS tablet TAKE ONE TABLET BY MOUTH TWICE DAILY 60 tablet 5 05/12/2024 Morning   fluticasone  (FLONASE ) 50 MCG/ACT nasal spray Place 2 sprays into both nostrils daily as needed for allergies or rhinitis. (Patient taking differently: Place 2 sprays into both nostrils daily.) 16 g 6 05/12/2024   furosemide  (LASIX ) 40 MG tablet Take 1 tablet (40 mg total) by mouth 2 (two) times daily.   05/12/2024   loperamide  (IMODIUM  A-D) 2 MG tablet Take 2 mg by mouth daily as needed for diarrhea or loose stools.   Unknown   loratadine   (CLARITIN ) 10 MG tablet Take 1 tablet (10 mg total) by mouth daily. 30 tablet 6 05/12/2024   methimazole  (TAPAZOLE ) 10 MG tablet Take 20 mg (two tablets) daily.   05/12/2024   ondansetron  (ZOFRAN ) 4 MG tablet TAKE ONE TABLET BY MOUTH EVERY EIGHT HOURS AS NEEDED FOR NAUSEA OR VOMITING 20 tablet 0 05/12/2024   pantoprazole  (PROTONIX ) 40 MG tablet TAKE ONE TABLET BY MOUTH TWICE DAILY BEFORE A MEAL (Patient taking differently: Take 40 mg by mouth daily.) 60 tablet 5 05/12/2024   polyethylene glycol powder (GLYCOLAX /MIRALAX ) 17 GM/SCOOP powder Take 17 g by mouth 2 (two) times daily. Dissolve 1 capful (17g) in 4-8 ounces of liquid and take by mouth daily. (Patient taking differently: Take 17 g by mouth daily as needed for mild constipation, moderate constipation or severe constipation.) 238 g 0 Unknown   potassium chloride  SA (KLOR-CON  M) 20 MEQ tablet Take 20 mEq by mouth daily.   05/12/2024   senna-docusate (SENOKOT-S) 8.6-50 MG tablet Take 2 tablets by mouth 2 (two) times daily. (Patient taking differently: Take 1-2 tablets by mouth daily as needed for mild  constipation or moderate constipation.) 120 tablet 0 Unknown   sildenafil  (REVATIO ) 20 MG tablet Take 2 tablets (40 mg total) by mouth 3 (three) times daily. 180 tablet 2 05/12/2024   spironolactone  (ALDACTONE ) 25 MG tablet Take 25 mg by mouth daily.   05/12/2024   tamsulosin  (FLOMAX ) 0.4 MG CAPS capsule Take 1 capsule (0.4 mg total) by mouth daily. 30 capsule 0 05/12/2024   tiZANidine  (ZANAFLEX ) 2 MG tablet Take 2 mg by mouth at bedtime.   Past Week   traMADol  (ULTRAM ) 50 MG tablet Take 50 mg by mouth daily as needed for moderate pain (pain score 4-6) or severe pain (pain score 7-10).   Past Week   albuterol  (VENTOLIN  HFA) 108 (90 Base) MCG/ACT inhaler Inhale 2 puffs into the lungs every 6 (six) hours as needed for wheezing or shortness of breath. (Patient not taking: Reported on 05/13/2024) 8 g 1 Not Taking   [EXPIRED] predniSONE  (DELTASONE ) 10 MG  tablet Take 2 tablets (20 mg total) by mouth daily with breakfast for 3 days, THEN 1 tablet (10 mg total) daily with breakfast for 3 days. (Patient not taking: Reported on 05/13/2024) 9 tablet 0 Not Taking    Family History  Problem Relation Age of Onset   Cancer Brother    Heart attack Sister    Breast cancer Sister    Diabetes Sister    Heart disease Sister    Breast cancer Sister    Dementia Sister    Colon cancer Sister    Heart disease Sister    Breast cancer Sister    Suicidality Brother    Prostate cancer Brother    Dementia Brother    Review of Systems:   Review of Systems  Constitutional:  Positive for chills, malaise/fatigue and weight loss. Negative for fever.       Reports she has lost 25-30lbs over the last 2-3 months  HENT:  Positive for hearing loss.        Hearing aids  Eyes:  Negative for blurred vision and double vision.  Respiratory:  Positive for cough, sputum production and shortness of breath. Negative for hemoptysis and wheezing.   Cardiovascular:  Negative for chest pain, palpitations and leg swelling.  Gastrointestinal:  Positive for constipation and nausea. Negative for abdominal pain, blood in stool, diarrhea, melena and vomiting.  Genitourinary:  Negative for dysuria.       Urinary retention requiring foley placement  Musculoskeletal:  Negative for falls.  Skin:  Negative for rash.  Neurological:  Positive for weakness. Negative for dizziness and loss of consciousness.  Endo/Heme/Allergies:  Bruises/bleeds easily.  Psychiatric/Behavioral:  Negative for depression. The patient is not nervous/anxious.    Physical Exam: BP (!) 110/56 (BP Location: Right Arm)   Pulse 66   Temp 97.9 F (36.6 C) (Oral)   Resp 18   Ht 4' 11 (1.499 m)   Wt 40.8 kg   SpO2 95%   BMI 18.18 kg/m   General appearance: alert, cooperative, and no distress Head: Normocephalic, without obvious abnormality, atraumatic Neck: no adenopathy, no carotid bruit, no JVD,  supple, symmetrical, trachea midline, and thyroid  not enlarged, symmetric, no tenderness/mass/nodules Lymph nodes: Cervical, supraclavicular, and axillary nodes normal. Resp: diminished right sided breath sounds Cardio: regular rate and rhythm GI: generalized tenderness, +BS, no distension, guarding or rebound tenderness Extremities: extremities normal, atraumatic, no cyanosis or edema Neurologic: Grossly normal Teeth: Dental carries present, no obvious infection   Recent Radiology Findings:   DG CHEST PORT 1 VIEW  Result Date: 05/15/2024 EXAM: 1 VIEW(S) XRAY OF THE CHEST 05/15/2024 12:22:00 PM COMPARISON: 05/14/2024 CLINICAL HISTORY: 78 year old female with stable rightward tracheal deviation due to thyroid  goiter/tumor. Progressed moderate right pleural effusion and compressive atelectasis. FINDINGS: LINES, TUBES AND DEVICES: Left chest cardiac pacing device noted. LUNGS AND PLEURA: Confluent and veiling right lung base opacity has mildly increased, compatible with progressed and now moderate right pleural effusion with atelectasis or consolidation. No pneumothorax. HEART AND MEDIASTINUM: Stable rightward deviation of the trachea at the thoracic inlet related to large thyroid  goiter or tumor. Atherosclerotic aortic calcifications. No acute abnormality of the cardiac and mediastinal silhouettes. BONES AND SOFT TISSUES: No acute osseous abnormality. IMPRESSION: 1. Moderate right pleural effusion, mildly increased compared to prior. Associated atelectasis or consolidation. 2. No new cardiopulmonary abnormality. Electronically signed by: Helayne Hurst MD 05/15/2024 12:47 PM EST RP Workstation: [id number]   DG ESOPHAGUS W SINGLE CM (SOL OR THIN BA) Result Date: 05/14/2024 CLINICAL DATA:  History of dysphagia x1 year since partial thyroidectomy. EXAM: ESOPHAGUS/BARIUM SWALLOW/TABLET STUDY TECHNIQUE: Single contrast examination was performed using thin liquid barium. This exam was performed by Kimble Clas,  PA-C, and was supervised and interpreted by Dr. Norleen Kil. FLUOROSCOPY: Radiation Exposure Index (as provided by the fluoroscopic device): 8.10 mGy Kerma COMPARISON:  None Available. FINDINGS: Exam significantly limited secondary to poor patient mobility, unable to stand or roll on fluoroscopy table. Exam also limited secondary to patient inability to perform rapid swallowing. Swallowing: Unable to assess due to inability to position patient. Pharynx: Unable to assess due to inability to position patient. Esophagus: Limited evaluation. No evidence of esophageal obstruction or mass. No definite stricture seen although esophagus could not be completely distended. Esophageal motility: Limited assessment due to inability to place patient in prone RAO position. Hiatal Hernia: None seen. Gastroesophageal reflux: Unable to assess. Ingested 13 mm barium tablet: Patient unable to swallow pill. Attempted x2 but patient spit up both times. Other: None. IMPRESSION: Significantly limited study. No evidence of esophageal obstruction or hiatal hernia. Electronically Signed   By: Norleen DELENA Kil M.D.   On: 05/14/2024 16:09     I have independently reviewed the above radiologic studies and discussed with the patient   Recent Lab Findings: Lab Results  Component Value Date   WBC 8.9 05/16/2024   HGB 10.2 (L) 05/16/2024   HCT 32.3 (L) 05/16/2024   PLT 425 (H) 05/16/2024   GLUCOSE 86 05/16/2024   CHOL 125 11/13/2020   TRIG 70 11/13/2020   HDL 50 11/13/2020   LDLCALC 60 11/13/2020   ALT 10 05/15/2024   AST 17 05/15/2024   NA 128 (L) 05/16/2024   K 3.1 (L) 05/16/2024   CL 94 (L) 05/16/2024   CREATININE 0.61 05/16/2024   BUN 6 (L) 05/16/2024   CO2 27 05/16/2024   TSH 1.305 05/01/2024   INR 1.5 (H) 05/13/2024   HGBA1C 6.5 (H) 05/01/2024    Assessment / Plan:   Recurrent right pleural effusion: Unknown cause, has undergone 3 right sided thoracenteses. Has multiple comorbidities, would be high risk for VATS with  pleural biopsy. Patient is DNI, does not think she wants to be a full code but also does not want to have to stay in the hospital with a pleural drain. Would likely benefit from Pleurx catheter but discussed this would not give a definitive diagnosis. Patient would like to discuss this further with her daughters and asked me to discuss this with her daughters as well. I spoke to [employer] (  daughter) and she feels surgery is the best option so that they can get a definitive diagnosis if it is not too risky and reports her mom was on board with that decision yesterday when they spoke. Dr. Kerrin to ultimately determine surgical candidacy.   Comorbidities to be managed per primary team: -Acute on chronic HFpEF -Atrial fibrillation on chronic eliquis : Eliquis  is being held for now -Tachy-brady syndrome s/p PPM -CAD -T2DM -HTN -Sleep apnea -Hyperthyroidism: s/p thyroidectomy 04/24 on methimazole  -Pulmonary hypertension: On sildenafil    I  spent 30 minutes counseling the patient face to face.  Con GORMAN Bend, PA-C 05/16/2024 11:04 AM   Patient seen and examined, records and imagers reviewed.  I met with the patient yesterday and with her and her daughter this morning.  History as noted above.  78 yo woman with numerous medical issues with a recurrent right pleural effusion.    Options include pleural catheter, but patient and daughter both very much against that idea.  Other options include pigtail catheter placement and talc  pleurodesis vs VATS for talc  pleurodesis.  Surgery is technically possible, but she is elderly and frail.  Would be high risk for perioperative morbidity.  In my experience with similar situations, biopsies typically show chronic inflammation but rarely the underlying process.    In my opinion the best option in her case is tube placement and talc  pleurodesis to avoid morbidity of surgery and general anesthesia.  Mrs. Goldwire and her daughter are interesting in  pursuing that.  Elspeth MOTE Kerrin, MD [facility] [phone]     [1]  Social History Tobacco Use  Smoking Status Never  Smokeless Tobacco Never  [2]  Allergies Allergen Reactions   Codeine Nausea And Vomiting   Ketorolac  Nausea And Vomiting and Other (See Comments)    pt told to never take this med   Meperidine  Nausea Only and Nausea And Vomiting   Multaq  [Dronedarone ] Other (See Comments)    Increased fluid retention   Xarelto [Rivaroxaban] Diarrhea   Flexeril  [Cyclobenzaprine ] Nausea And Vomiting   Latex Rash    Pt stated on 09/20/2022

## 2024-05-16 NOTE — Progress Notes (Signed)
°  Progress Note   Patient: Hailey [name] FMW:[id number] DOB: 09-21-1945 DOA: 05/12/2024     3 DOS: the patient was seen and examined on 05/16/2024 at 9:05AM      Brief hospital course: 78 y.o. F with dCHF, pHTN on sildenafil , SSS and persAF s/p ablation, on Eliquis , hx PPM, CAD no PCI, hx HTN, DM, hyperthyroidism/goiter on methimazole  who presented with recurrent pleural effusion.  Pulmonary and CT surgery consulted.     Assessment and Plan: See previous summary from Dr. Royal from 12/13  Recurrent pleural effusion -Consult pulmonology - Consult CT surgery  Urinary retention Resolved  Hyponatremia Asymptomatic  Hypokalemia - Supplement potassium  Chronic diastolic congestive heart failure Pulmonary hypertension Coronary artery disease Acute CHF ruled out, appears euvolemic. - Resume furosemide  and spironolactone   Persistent atrial fibrillation -Hold apixaban  - Continue therapeutic dose Lovenox   Hyperthyroidism due to multinodular goiter s/p - Continue methimazole         Subjective: Patient is feeling better, able to void and have a bowel movement today, breathing feels okay, no fever, no respiratory distress.  Pulmonology consulted yesterday, CT surgery consulted today.      Physical Exam: BP (!) 110/56 (BP Location: Right Arm)   Pulse 66   Temp 97.9 F (36.6 C) (Oral)   Resp 18   Ht 4' 11 (1.499 m)   Wt 40.8 kg   SpO2 95%   BMI 18.18 kg/m   Adult female, sitting up in recliner, interactive and appropriate RRR, no murmurs, no peripheral edema Respiratory rate normal, lungs clear without rales or wheezes, diminished at the right base Abdomen soft, no tenderness palpation or guarding, no ascites or distention Attention normal, affect normal, judgment and insight appear normal   Data Reviewed: Discussed with pulmonology Basic metabolic panel shows hypokalemia and hyponatremia, normal renal function CBC shows resolved leukocytosis, mild  anemia     Family Communication:     Disposition: Status is: Inpatient         Author: Lonni SHAUNNA Dalton, MD 05/16/2024 12:41 PM  For on call review [email or web address].

## 2024-05-16 NOTE — Hospital Course (Signed)
 78 y.o. F with dCHF, pHTN on sildenafil , SSS and persAF s/p ablation, on Eliquis , hx PPM, CAD no PCI, hx HTN, DM, hyperthyroidism/goiter on methimazole  who presented with recurrent pleural effusion.  Pulmonary and CT surgery consulted.

## 2024-05-17 ENCOUNTER — Inpatient Hospital Stay (HOSPITAL_COMMUNITY)

## 2024-05-17 DIAGNOSIS — J9811 Atelectasis: Secondary | ICD-10-CM | POA: Diagnosis not present

## 2024-05-17 DIAGNOSIS — Z4682 Encounter for fitting and adjustment of non-vascular catheter: Secondary | ICD-10-CM | POA: Diagnosis not present

## 2024-05-17 DIAGNOSIS — I771 Stricture of artery: Secondary | ICD-10-CM | POA: Diagnosis not present

## 2024-05-17 DIAGNOSIS — J90 Pleural effusion, not elsewhere classified: Secondary | ICD-10-CM | POA: Diagnosis not present

## 2024-05-17 LAB — CBC
HCT: 33.5 % — ABNORMAL LOW (ref 36.0–46.0)
Hemoglobin: 10.6 g/dL — ABNORMAL LOW (ref 12.0–15.0)
MCH: 26 pg (ref 26.0–34.0)
MCHC: 31.6 g/dL (ref 30.0–36.0)
MCV: 82.3 fL (ref 80.0–100.0)
Platelets: 403 K/uL — ABNORMAL HIGH (ref 150–400)
RBC: 4.07 MIL/uL (ref 3.87–5.11)
RDW: 15.9 % — ABNORMAL HIGH (ref 11.5–15.5)
WBC: 8.6 K/uL (ref 4.0–10.5)
nRBC: 0 % (ref 0.0–0.2)

## 2024-05-17 LAB — COMPREHENSIVE METABOLIC PANEL WITH GFR
ALT: 9 U/L (ref 0–44)
AST: 13 U/L — ABNORMAL LOW (ref 15–41)
Albumin: 2 g/dL — ABNORMAL LOW (ref 3.5–5.0)
Alkaline Phosphatase: 86 U/L (ref 38–126)
Anion gap: 10 (ref 5–15)
BUN: <5 mg/dL — ABNORMAL LOW (ref 8–23)
CO2: 25 mmol/L (ref 22–32)
Calcium: 7.8 mg/dL — ABNORMAL LOW (ref 8.9–10.3)
Chloride: 96 mmol/L — ABNORMAL LOW (ref 98–111)
Creatinine, Ser: 0.66 mg/dL (ref 0.44–1.00)
GFR, Estimated: >60 mL/min (ref >60)
Glucose, Bld: 110 mg/dL — ABNORMAL HIGH (ref 70–99)
Potassium: 3 mmol/L — ABNORMAL LOW (ref 3.5–5.1)
Sodium: 131 mmol/L — ABNORMAL LOW (ref 135–145)
Total Bilirubin: 1.1 mg/dL (ref 0.0–1.2)
Total Protein: 4.7 g/dL — ABNORMAL LOW (ref 6.5–8.1)

## 2024-05-17 LAB — URINE CULTURE: Culture: >=100000 — AB

## 2024-05-17 LAB — MAGNESIUM: Magnesium: 1.5 mg/dL — ABNORMAL LOW (ref 1.7–2.4)

## 2024-05-17 MED ORDER — MAGNESIUM SULFATE 4 GM/100ML IV SOLN
4.0000 g | Freq: Once | INTRAVENOUS | Status: AC
  Administered 2024-05-17: 16:00:00 4 g via INTRAVENOUS
  Filled 2024-05-17: qty 100

## 2024-05-17 MED ORDER — POTASSIUM CHLORIDE CRYS ER 20 MEQ PO TBCR
40.0000 meq | EXTENDED_RELEASE_TABLET | Freq: Once | ORAL | Status: AC
  Administered 2024-05-17: 12:00:00 40 meq via ORAL
  Filled 2024-05-17: qty 2

## 2024-05-17 MED ORDER — LIDOCAINE HCL (PF) 1 % IJ SOLN
INTRAMUSCULAR | Status: AC
  Administered 2024-05-17: 13:00:00 5 mL
  Filled 2024-05-17: qty 15

## 2024-05-17 MED ORDER — POTASSIUM CHLORIDE 10 MEQ/100ML IV SOLN: 10.0000 meq | INTRAVENOUS | Status: DC

## 2024-05-17 NOTE — Procedures (Signed)
 Insertion of Chest Tube Procedure Note  HENLI HEY  [id number]  Jul 16, 1945  Date:05/17/2024  Time:1:51 PM    Provider Performing: Zola SAILOR Sibbie Flammia   Procedure: Pleural Catheter Insertion w/ Imaging Guidance (67442)  Indication(s) Effusion  Consent Risks of the procedure as well as the alternatives and risks of each were explained to the patient and/or caregiver.  Consent for the procedure was obtained and is signed in the bedside chart  Anesthesia Topical only with 1% lidocaine     Time Out Verified patient identification, verified procedure, site/side was marked, verified correct patient position, special equipment/implants available, medications/allergies/relevant history reviewed, required imaging and test results available.   Sterile Technique Maximal sterile technique including full sterile barrier drape, hand hygiene, sterile gown, sterile gloves, mask, hair covering, sterile ultrasound probe cover (if used).   Procedure Description Ultrasound used to identify appropriate pleural anatomy for placement and overlying skin marked. Area of placement cleaned and draped in sterile fashion.  A 14 French pigtail pleural catheter was placed into the right pleural space using Seldinger technique. Appropriate return of fluid was obtained.  The tube was connected to atrium and placed on -20 cm H2O wall suction.   Complications/Tolerance None; patient tolerated the procedure well. Chest X-ray is ordered to verify placement.   EBL Minimal  Specimen(s) none

## 2024-05-17 NOTE — Progress Notes (Signed)
 Assisted with right chest tube placement. Consent signed and in chart. VSS stable throughout. Dressing CDI. CXR ordered. Placed to suction. immediately drained, pt endorses some pain, per MD clamped x2 hour--unclamp [id number].

## 2024-05-17 NOTE — Plan of Care (Signed)

## 2024-05-17 NOTE — Care Management Important Message (Signed)
 Important Message  Patient Details  Name: Hailey [name] MRN: [id number] Date of Birth: 05-07-1946   Important Message Given:  Yes - Medicare IM     Claretta Deed 05/17/2024, 4:31 PM

## 2024-05-17 NOTE — Progress Notes (Signed)
°  Progress Note   Patient: Hailey [name] FMW:[id number] DOB: Apr 03, 1946 DOA: 05/12/2024     4 DOS: the patient was seen and examined on 05/17/2024 at 10:01 AM      Brief hospital course: 78 y.o. F with dCHF, pHTN on sildenafil , SSS and persAF s/p ablation, on Eliquis , hx PPM, CAD no PCI, hx HTN, DM, hyperthyroidism/goiter on methimazole  who presented with recurrent pleural effusion.  Pulmonary and CT surgery consulted.     Assessment and Plan: See previous summary from Dr. Royal from 12/13   Recurrent pleural effusion Patient was admitted and pulmonology were consulted.  She had a thoracentesis which showed resolving inflammation and pleural fluid.  Pulmonology discussed options and consulted CT surgery who recommended against surgery.  Decision was made to attempt talc  pleurodesis via chest tube.  Chest tube placed today - Consult pulmonology and CT surgery    Urinary retention -Continue indwelling Foley - Outpatient urology follow-up   Hyponatremia Asymptomatic   Hypokalemia - Supplement K   Chronic diastolic congestive heart failure Pulmonary hypertension Coronary artery disease Acute CHF ruled out, appears euvolemic - Continue furosemide  and spironolactone    Persistent atrial fibrillation -Continue therapeutic dose Lovenox  - Hold apixaban    Hyperthyroidism due to multinodular goiter s/p thyroidectomy - Continue methimazole          Subjective: Chest tube placed today for pleurodesis no fever, no respiratory distress.  Still not able to urinate      Physical Exam: BP (!) 138/54 (BP Location: Left Arm)   Pulse 78   Temp 98.2 F (36.8 C) (Oral)   Resp 17   Ht 4' 11 (1.499 m)   Wt 40.8 kg   SpO2 98%   BMI 18.18 kg/m   Adult female, sitting up in bed, interactive and appropriate RRR, no murmurs, no peripheral edema Respiratory rate normal, lungs clear without rales or wheezes Abdomen soft, no tenderness palpation or guarding, no ascites or  distention Attention normal, affect normal, judgment insight appear normal    Data Reviewed: Patient metabolic panel shows hyponatremia, hypokalemia Normal renal function CBC shows no leukocytosis, mild anemia    Family Communication: Daughter at the bedside    Disposition: Status is: Inpatient         Author: Lonni SHAUNNA Dalton, MD 05/17/2024 3:41 PM  For on call review [email or web address].

## 2024-05-17 NOTE — Progress Notes (Addendum)
 Hailey [name], MRN:  [id number], DOB:  [birth year], LOS: 4 ADMISSION DATE:  05/12/2024, CONSULTATION DATE:  05/17/2024 REFERRING MD:  Jonel Lonni SQUIBB, *, CHIEF COMPLAINT:  recurrent pleural effusion   History of Present Illness:   Hailey [name] is a 78 y.o. woman with extensive past medical history  In November she was admitted with worsening shortness of breath and was found to have right pleural effusion for which she underwent thoracentesis with IR x 2.  This was exudative based on LDH.  She was diuresed with IV Lasix .  She also has secondary pulmonary hypertension and is on sildenafil  per Dr. Cherrie.  During this admission she had autoimmune testing done due to uncertainty about the nature of the effusion.  Her ANA was positive.  Complement levels were normal.  Her ESR and CRP were elevated.  She also had right heart catheterization this admission which showed normal left-sided lung pressures and cardiac output.  The plan had been to follow-up as an outpatient to discuss additional pulmonary vasodilators given her mild elevation in PVR despite sildenafil  therapy.  She was started on empiric steroid therapy with prednisone  and told to follow-up with rheumatology as an outpatient for ongoing management of the effusion.  He was recommended be discharged to SNF but she declined and went home.  She presents now again with worsening shortness of breath, abdominal distention, effusion.  Pulmonary being consulted for management of the right pleural effusion.  Of note note , MBS performed last admission- no aspiration.   She has not yet seen rheumatology.    Pertinent  Medical History   type 2 diabetes,  coronary artery disease,  chronic diastolic heart failure,  permanent atrial fibrillation status post pacemaker,  hypothyroidism.  Significant Hospital Events: Including procedures, antibiotic start and stop dates in addition to other pertinent events   11/28 Thora 1.1 L   12/1 Thora 900 cc 12/11 Thora 800 cc  Interim History / Subjective:  Has no complaints today.  Decided to pursue the route of chest tube and talc  pleurodesis rather than thoracic surgery going for VATS. Objective    Blood pressure (!) 138/54, pulse 79, temperature 98.2 F (36.8 C), temperature source Oral, resp. rate 17, height 4' 11 (1.499 m), weight 40.8 kg, SpO2 97%.        Intake/Output Summary (Last 24 hours) at 05/17/2024 1348 Last data filed at 05/17/2024 9078 Gross per 24 hour  Intake 1097.39 ml  Output 825 ml  Net 272.39 ml   Filed Weights   05/12/24 1503 05/13/24 1836  Weight: 42.2 kg 40.8 kg    Examination: Elderly woman, not in acute distress Decreased air entry on the right lung base Regular rate and rhythm, normal S1, S2 Soft, nontender, nondistended abdomen No peripheral edema  Pleural fluid from 12/11 -LDH decreased to 74, protein 4.0, 322 cells 65% neutrophils  12/1 Cell count 5100 TNC 80% neutrophil, although there was fibrin clumping Before that 11/28 7300, monocyte predominant Chest xray 12/13 shows reaccumulation of right pleural fluid  Labs show hyponatremia, hypokalemia, improved leukocytosis  Resolved problem list   Assessment and Plan   Recurrent right pleural effusion +ANA 1:160 homogeneous, negative RA factor, negative dsDNA, negative CCP Acute on chronic HFpEF Pulmonary hypertension Group 1-2 -echo showed severely elevated RVSP 81 however RHC 12/1 showed much improved PA pressures 44/22 and PCW 11 Atrial fibrillation s/p ppm  Exudative effusion could be pseudo exudative or inflammatory.  Cytology negative x 2.  No  growth on cultures.  Discussions regarding VATS versus chest tube and talc  pleurodesis versus indwelling pleural catheter.  Risks and benefits of each procedure discussed with the patient and her daughter.  They elected to pursue chest tube placement and talc  pleurodesis.  Chest tube was placed today.  Please see procedure  note.  Will keep on suction.  Once output is less than 200 and a day, plan to instill talc  and monitor for 24 hours after.  -Chest x-ray now and tomorrow morning - PCCM will follow along - Lovenox  on hold  [name] MD Pulmonary and Critical Care Medicine [facility] 05/17/2024 1:48 PM Pager: see AMION  If no response to pager, please call critical care on call (see AMION) until 7pm After 7:00 pm call Elink      Labs   CBC: Recent Labs  Lab 05/13/24 0534 05/14/24 0445 05/15/24 1157 05/16/24 0623 05/17/24 0351  WBC 12.8* 12.4* 13.6* 8.9 8.6  NEUTROABS  --  10.2* 11.5* 7.1  --   HGB 11.8* 12.1 11.5* 10.2* 10.6*  HCT 36.9 37.8 35.9* 32.3* 33.5*  MCV 81.6 80.6 81.0 81.2 82.3  PLT 523* 495* 458* 425* 403*    Basic Metabolic Panel: Recent Labs  Lab 05/13/24 0534 05/14/24 0445 05/15/24 1157 05/16/24 0623 05/17/24 0351  NA 127* 126* 126* 128* 131*  K 3.7 3.3* 3.5 3.1* 3.0*  CL 85* 85* 87* 94* 96*  CO2 32 30 30 27 25   GLUCOSE 106* 115* 99 86 110*  BUN 11 10 8  6* <5*  CREATININE 0.65 0.80 0.85 0.61 0.66  CALCIUM  8.4* 7.9* 7.9* 7.6* 7.8*  MG  --   --   --   --  1.5*   GFR: Estimated Creatinine Clearance: 36.7 mL/min (by C-G formula based on SCr of 0.66 mg/dL). Recent Labs  Lab 05/14/24 0445 05/15/24 1157 05/16/24 0623 05/17/24 0351  WBC 12.4* 13.6* 8.9 8.6    Liver Function Tests: Recent Labs  Lab 05/12/24 1558 05/14/24 0445 05/15/24 1157 05/17/24 0351  AST 16 14* 17 13*  ALT 16 13 10 9   ALKPHOS 118 100 97 86  BILITOT 1.0 1.3* 1.2 1.1  PROT 6.6 5.4* 5.3* 4.7*  ALBUMIN  3.2* 2.5* 2.5* 2.0*   No results for input(s): LIPASE, AMYLASE in the last 168 hours. No results for input(s): AMMONIA in the last 168 hours.  ABG    Component Value Date/Time   PHART 7.415 (H) 11/25/2010 1435   PCO2ART 38.8 11/25/2010 1435   PO2ART 67.4 (L) 11/25/2010 1435   HCO3 33.7 (H) 05/03/2024 1154   TCO2 35 (H) 05/03/2024 1154   O2SAT 75 05/03/2024  1154     Coagulation Profile: Recent Labs  Lab 05/13/24 0534  INR 1.5*    Cardiac Enzymes: No results for input(s): CKTOTAL, CKMB, CKMBINDEX, TROPONINI in the last 168 hours.  HbA1C: Hgb A1c MFr Bld  Date/Time Value Ref Range Status  05/01/2024 09:35 AM 6.5 (H) 4.8 - 5.6 % Final    Comment:    (NOTE)         Prediabetes: 5.7 - 6.4         Diabetes: >6.4         Glycemic control for adults with diabetes: <7.0   05/13/2023 02:18 PM 7.3 (H) <5.7 % of total Hgb Final    Comment:    For someone without known diabetes, a hemoglobin A1c value of 6.5% or greater indicates that they may have  diabetes and this should be  confirmed with a follow-up  test. . For someone with known diabetes, a value <7% indicates  that their diabetes is well controlled and a value  greater than or equal to 7% indicates suboptimal  control. A1c targets should be individualized based on  duration of diabetes, age, comorbid conditions, and  other considerations. . Currently, no consensus exists regarding use of hemoglobin A1c for diagnosis of diabetes for children. .     CBG: No results for input(s): GLUCAP in the last 168 hours.

## 2024-05-18 ENCOUNTER — Inpatient Hospital Stay (HOSPITAL_COMMUNITY)

## 2024-05-18 DIAGNOSIS — J90 Pleural effusion, not elsewhere classified: Secondary | ICD-10-CM | POA: Diagnosis not present

## 2024-05-18 LAB — CBC
HCT: 35.1 % — ABNORMAL LOW (ref 36.0–46.0)
Hemoglobin: 11.2 g/dL — ABNORMAL LOW (ref 12.0–15.0)
MCH: 26.4 pg (ref 26.0–34.0)
MCHC: 31.9 g/dL (ref 30.0–36.0)
MCV: 82.6 fL (ref 80.0–100.0)
Platelets: 420 K/uL — ABNORMAL HIGH (ref 150–400)
RBC: 4.25 MIL/uL (ref 3.87–5.11)
RDW: 15.9 % — ABNORMAL HIGH (ref 11.5–15.5)
WBC: 10.5 K/uL (ref 4.0–10.5)
nRBC: 0 % (ref 0.0–0.2)

## 2024-05-18 LAB — BASIC METABOLIC PANEL WITH GFR
Anion gap: 6 (ref 5–15)
BUN: <5 mg/dL — ABNORMAL LOW (ref 8–23)
CO2: 32 mmol/L (ref 22–32)
Calcium: 7.4 mg/dL — ABNORMAL LOW (ref 8.9–10.3)
Chloride: 91 mmol/L — ABNORMAL LOW (ref 98–111)
Creatinine, Ser: 0.64 mg/dL (ref 0.44–1.00)
GFR, Estimated: >60 mL/min (ref >60)
Glucose, Bld: 207 mg/dL — ABNORMAL HIGH (ref 70–99)
Potassium: 2.6 mmol/L — CL (ref 3.5–5.1)
Sodium: 129 mmol/L — ABNORMAL LOW (ref 135–145)

## 2024-05-18 LAB — CYTOLOGY - NON PAP

## 2024-05-18 LAB — MAGNESIUM: Magnesium: 2.2 mg/dL (ref 1.7–2.4)

## 2024-05-18 LAB — PHOSPHORUS: Phosphorus: 2.7 mg/dL (ref 2.5–4.6)

## 2024-05-18 MED ORDER — POTASSIUM CHLORIDE CRYS ER 20 MEQ PO TBCR
40.0000 meq | EXTENDED_RELEASE_TABLET | Freq: Once | ORAL | Status: AC
  Administered 2024-05-18: 09:00:00 40 meq via ORAL
  Filled 2024-05-18: qty 2

## 2024-05-18 MED ORDER — OXYCODONE HCL 5 MG PO TABS
5.0000 mg | ORAL_TABLET | ORAL | Status: DC | PRN
  Administered 2024-05-18 – 2024-05-22 (×11): 5 mg via ORAL
  Filled 2024-05-18 (×13): qty 1

## 2024-05-18 MED ORDER — POTASSIUM CHLORIDE CRYS ER 20 MEQ PO TBCR
40.0000 meq | EXTENDED_RELEASE_TABLET | Freq: Two times a day (BID) | ORAL | Status: AC
  Administered 2024-05-18 (×2): 40 meq via ORAL
  Filled 2024-05-18 (×2): qty 2

## 2024-05-18 NOTE — Progress Notes (Signed)
 Hailey [name], MRN:  [id number], DOB:  Nov 18, 1945, LOS: 5 ADMISSION DATE:  05/12/2024, CONSULTATION DATE:  05/18/2024 REFERRING MD:  Jonel Lonni SQUIBB, *, CHIEF COMPLAINT:  recurrent pleural effusion   History of Present Illness:   Hailey [name] is a 78 y.o. woman with extensive past medical history  In November she was admitted with worsening shortness of breath and was found to have right pleural effusion for which she underwent thoracentesis with IR x 2.  This was exudative based on LDH.  She was diuresed with IV Lasix .  She also has secondary pulmonary hypertension and is on sildenafil  per Dr. Bensimhon.  During this admission she had autoimmune testing done due to uncertainty about the nature of the effusion.  Her ANA was positive.  Complement levels were normal.  Her ESR and CRP were elevated.  She also had right heart catheterization this admission which showed normal left-sided lung pressures and cardiac output.  The plan had been to follow-up as an outpatient to discuss additional pulmonary vasodilators given her mild elevation in PVR despite sildenafil  therapy.  She was started on empiric steroid therapy with prednisone  and told to follow-up with rheumatology as an outpatient for ongoing management of the effusion.  He was recommended be discharged to SNF but she declined and went home.  She presents now again with worsening shortness of breath, abdominal distention, effusion.  Pulmonary being consulted for management of the right pleural effusion.  Of note note , MBS performed last admission- no aspiration.   She has not yet seen rheumatology.    Pertinent  Medical History   type 2 diabetes,  coronary artery disease,  chronic diastolic heart failure,  permanent atrial fibrillation status post pacemaker,  hypothyroidism.  Significant Hospital Events: Including procedures, antibiotic start and stop dates in addition to other pertinent events   11/28 Thora 1.1 L   12/1 Thora 900 cc 12/11 Thora 800 cc  Interim History / Subjective:  No overnight events.  Complaining of some pain around the insertion site of the chest tube.  States that she feels better since yesterday.  No breathing issues. Objective    Blood pressure (!) 106/55, pulse 67, temperature 97.7 F (36.5 C), resp. rate 18, height 4' 11 (1.499 m), weight 40.8 kg, SpO2 100%.        Intake/Output Summary (Last 24 hours) at 05/18/2024 1557 Last data filed at 05/18/2024 1216 Gross per 24 hour  Intake 594 ml  Output 2050 ml  Net -1456 ml   Filed Weights   05/12/24 1503 05/13/24 1836  Weight: 42.2 kg 40.8 kg    Examination: Elderly woman, not in acute distress Good bilateral air entry Giller rate and rhythm, normal S1, S2 Soft, nontender, nondistended abdomen No peripheral edema  Pleural fluid with exudative features.  Laboratory workup significant for hypokalemia and hyponatremia.  Resolved problem list   Assessment and Plan   Recurrent right pleural effusion +ANA 1:160 homogeneous, negative RA factor, negative dsDNA, negative CCP Acute on chronic HFpEF Pulmonary hypertension Group 1-2 -echo showed severely elevated RVSP 81 however RHC 12/1 showed much improved PA pressures 44/22 and PCW 11 Atrial fibrillation s/p ppm   Exudative effusion of unclear etiology.  Could be related to chronic inflammation versus pseudo exudate from heart failure.  Family in agreement with tube drainage and talc  pleurodesis.  Has had significant output from the chest tube over the past 24 hours.  Will wait till the output is less than 200  today and then plan to perform talc  pleurodesis after.  Continue with daily chest x-ray.  Continue to hold Lovenox .  Pulmonary will continue to follow   [name] MD Pulmonary and Critical Care Medicine [facility] 05/18/2024 3:57 PM Pager: see AMION  If no response to pager, please call critical care on call (see AMION) until 7pm After  7:00 pm call Elink      Labs   CBC: Recent Labs  Lab 05/14/24 0445 05/15/24 1157 05/16/24 0623 05/17/24 0351 05/18/24 0506  WBC 12.4* 13.6* 8.9 8.6 10.5  NEUTROABS 10.2* 11.5* 7.1  --   --   HGB 12.1 11.5* 10.2* 10.6* 11.2*  HCT 37.8 35.9* 32.3* 33.5* 35.1*  MCV 80.6 81.0 81.2 82.3 82.6  PLT 495* 458* 425* 403* 420*    Basic Metabolic Panel: Recent Labs  Lab 05/14/24 0445 05/15/24 1157 05/16/24 0623 05/17/24 0351 05/18/24 0506  NA 126* 126* 128* 131* 129*  K 3.3* 3.5 3.1* 3.0* 2.6*  CL 85* 87* 94* 96* 91*  CO2 30 30 27 25  32  GLUCOSE 115* 99 86 110* 207*  BUN 10 8 6* <5* <5*  CREATININE 0.80 0.85 0.61 0.66 0.64  CALCIUM  7.9* 7.9* 7.6* 7.8* 7.4*  MG  --   --   --  1.5* 2.2  PHOS  --   --   --   --  2.7   GFR: Estimated Creatinine Clearance: 36.7 mL/min (by C-G formula based on SCr of 0.64 mg/dL). Recent Labs  Lab 05/15/24 1157 05/16/24 0623 05/17/24 0351 05/18/24 0506  WBC 13.6* 8.9 8.6 10.5    Liver Function Tests: Recent Labs  Lab 05/12/24 1558 05/14/24 0445 05/15/24 1157 05/17/24 0351  AST 16 14* 17 13*  ALT 16 13 10 9   ALKPHOS 118 100 97 86  BILITOT 1.0 1.3* 1.2 1.1  PROT 6.6 5.4* 5.3* 4.7*  ALBUMIN  3.2* 2.5* 2.5* 2.0*   No results for input(s): LIPASE, AMYLASE in the last 168 hours. No results for input(s): AMMONIA in the last 168 hours.  ABG    Component Value Date/Time   PHART 7.415 (H) 11/25/2010 1435   PCO2ART 38.8 11/25/2010 1435   PO2ART 67.4 (L) 11/25/2010 1435   HCO3 33.7 (H) 05/03/2024 1154   TCO2 35 (H) 05/03/2024 1154   O2SAT 75 05/03/2024 1154     Coagulation Profile: Recent Labs  Lab 05/13/24 0534  INR 1.5*    Cardiac Enzymes: No results for input(s): CKTOTAL, CKMB, CKMBINDEX, TROPONINI in the last 168 hours.  HbA1C: Hgb A1c MFr Bld  Date/Time Value Ref Range Status  05/01/2024 09:35 AM 6.5 (H) 4.8 - 5.6 % Final    Comment:    (NOTE)         Prediabetes: 5.7 - 6.4         Diabetes: >6.4          Glycemic control for adults with diabetes: <7.0   05/13/2023 02:18 PM 7.3 (H) <5.7 % of total Hgb Final    Comment:    For someone without known diabetes, a hemoglobin A1c value of 6.5% or greater indicates that they may have  diabetes and this should be confirmed with a follow-up  test. . For someone with known diabetes, a value <7% indicates  that their diabetes is well controlled and a value  greater than or equal to 7% indicates suboptimal  control. A1c targets should be individualized based on  duration of diabetes, age, comorbid conditions, and  other  considerations. . Currently, no consensus exists regarding use of hemoglobin A1c for diagnosis of diabetes for children. .     CBG: No results for input(s): GLUCAP in the last 168 hours.

## 2024-05-18 NOTE — [place] FL2 (Signed)
 [place]  MEDICAID FL2 LEVEL OF CARE FORM     IDENTIFICATION  Patient Name: Hailey [name] Birthdate: 1946-05-08 Sex: female Admission Date (Current Location): 05/12/2024  [facility] and [place] Number:  Producer, Television/film/video and Address:  [facility], [address], [place], [place] [address]      Provider Number: [id number]  Attending Physician Name and Address:  Jonel Lonni SQUIBB, *  Relative Name and Phone Number:  Lucian Doffing  Daughter, Emergency Contact  [phone]    Current Level of Care: Hospital Recommended Level of Care: Skilled Nursing Facility Prior Approval Number:    Date Approved/Denied:   PASRR Number: [id number]  Discharge Plan: SNF    Current Diagnoses: Patient Active Problem List   Diagnosis Date Noted   Weakness 05/13/2024   Pleural effusion 05/01/2024   Acute on chronic heart failure (HCC) 04/30/2024   Immunization due 04/14/2024   Chronic systolic CHF (congestive heart failure) (HCC) 09/20/2022   Urinary incontinence 09/20/2022   GERD (gastroesophageal reflux disease) 09/20/2022   Obstructive sleep apnea 01/16/2022   Thyroid  goiter 01/16/2022   Right hip pain 09/25/2021   Snoring 08/22/2021   Longstanding persistent atrial fibrillation (HCC) 07/10/2021   Pacemaker battery depletion 08/09/2019   Portal hypertension (HCC) 06/14/2019   DM2 (diabetes mellitus, type 2) (HCC) 12/17/2018   Hypercoagulable state due to atrial fibrillation (HCC) 12/17/2018   PAH (pulmonary artery hypertension) (HCC) 06/01/2018   SSS (sick sinus syndrome) (HCC)    Hyperthyroidism 09/08/2017   Essential hypertension 09/08/2017   Abnormal cardiovascular stress test    Pacemaker lead failure, initial encounter 05/26/2017   Multinodular goiter 02/20/2017   TMJ pain dysfunction syndrome 02/20/2017   Long term current use of anticoagulant 10/11/2015   Second degree AV block 10/10/2015   Atherosclerosis of native coronary artery of  native heart with angina pectoris 08/28/2013   Permanent atrial fibrillation (HCC) 08/10/2013   Nausea, vomiting, and diarrhea 05/27/2013   Chronic diastolic heart failure (HCC) 05/26/2013   Pacemaker [employer] Jude accent DR RF , July 2012 03/25/2013   Paroxysmal atrial fibrillation (HCC) 03/02/2012   Symptomatic sinus bradycardia 03/02/2012   Pulmonary nodule 02/18/2011   Mediastinal lymphadenopathy 02/18/2011    Orientation RESPIRATION BLADDER Height & Weight     Self, Time, Situation, Place  O2 Continent Weight: 90 lb (40.8 kg) Height:  4' 11 (149.9 cm)  BEHAVIORAL SYMPTOMS/MOOD NEUROLOGICAL BOWEL NUTRITION STATUS      Continent Diet (see DC summary)  AMBULATORY STATUS COMMUNICATION OF NEEDS Skin   Limited Assist   Normal                       Personal Care Assistance Level of Assistance  Bathing, Feeding, Dressing Bathing Assistance: Limited assistance Feeding assistance: Independent Dressing Assistance: Maximum assistance (Moderate assist)     Functional Limitations Info  Sight, Hearing, Speech Sight Info: Adequate Hearing Info: Impaired Speech Info: Adequate    SPECIAL CARE FACTORS FREQUENCY  PT (By licensed PT), OT (By licensed OT)     PT Frequency: 5x/wk OT Frequency: 5x/wk            Contractures Contractures Info: Not present    Additional Factors Info  Code Status, Allergies Code Status Info: DNR Allergies Info: Codeine  Ketorolac   Meperidine   Multaq  (Dronedarone )  Xarelto (Rivaroxaban)  Flexeril  (Cyclobenzaprine )  Latex           Current Medications (05/18/2024):  This is the current hospital active medication  list Current Facility-Administered Medications  Medication Dose Route Frequency Provider Last Rate Last Admin   acetaminophen  (TYLENOL ) tablet 650 mg  650 mg Oral Q6H PRN Dena Charleston, MD   650 mg at 05/18/24 0847   Or   acetaminophen  (TYLENOL ) suppository 650 mg  650 mg Rectal Q6H PRN Dena Charleston, MD       albuterol  (PROVENTIL )  (2.5 MG/3ML) 0.083% nebulizer solution 2.5 mg  2.5 mg Inhalation Q6H PRN Samtani, Jai-Gurmukh, MD       Chlorhexidine  Gluconate Cloth 2 % PADS 6 each  6 each Topical Daily Samtani, Jai-Gurmukh, MD   6 each at 05/18/24 1104   fluticasone  (FLONASE ) 50 MCG/ACT nasal spray 2 spray  2 spray Each Nare Daily PRN Samtani, Jai-Gurmukh, MD       furosemide  (LASIX ) tablet 40 mg  40 mg Oral BID Jonel Lonni SQUIBB, MD   40 mg at 05/18/24 9160   methimazole  (TAPAZOLE ) tablet 20 mg  20 mg Oral Daily Dorrell, Charleston, MD   20 mg at 05/18/24 9161   ondansetron  (ZOFRAN ) tablet 4 mg  4 mg Oral Q6H PRN Dena Charleston, MD   4 mg at 05/16/24 1237   Or   ondansetron  (ZOFRAN ) injection 4 mg  4 mg Intravenous Q6H PRN Dena Charleston, MD   4 mg at 05/15/24 9087   Oral care mouth rinse  15 mL Mouth Rinse PRN Samtani, Jai-Gurmukh, MD       Oral care mouth rinse  15 mL Mouth Rinse 4 times per day Samtani, Jai-Gurmukh, MD   15 mL at 05/18/24 1105   Oral care mouth rinse  15 mL Mouth Rinse PRN Samtani, Jai-Gurmukh, MD       Oral care mouth rinse  15 mL Mouth Rinse 4 times per day Samtani, Jai-Gurmukh, MD   15 mL at 05/18/24 1105   oxyCODONE  (Oxy IR/ROXICODONE ) immediate release tablet 5 mg  5 mg Oral Q4H PRN Jonel Lonni SQUIBB, MD   5 mg at 05/18/24 1226   pantoprazole  (PROTONIX ) EC tablet 40 mg  40 mg Oral BID Dorrell, Robert, MD   40 mg at 05/18/24 9161   polyethylene glycol (MIRALAX  / GLYCOLAX ) packet 17 g  17 g Oral BID Samtani, Jai-Gurmukh, MD   17 g at 05/16/24 9051   potassium chloride  SA (KLOR-CON  M) CR tablet 40 mEq  40 mEq Oral BID Jonel Lonni SQUIBB, MD   40 mEq at 05/18/24 1217   senna-docusate (Senokot-S) tablet 1 tablet  1 tablet Oral QHS Samtani, Jai-Gurmukh, MD   1 tablet at 05/15/24 2357   sildenafil  (REVATIO ) tablet 40 mg  40 mg Oral TID Dorrell, Robert, MD   40 mg at 05/18/24 9162   sodium chloride  (OCEAN) 0.65 % nasal spray 1 spray  1 spray Nasal PRN Samtani, Jai-Gurmukh, MD       spironolactone   (ALDACTONE ) tablet 25 mg  25 mg Oral Daily Danford, Christopher P, MD   25 mg at 05/18/24 9161   tiZANidine  (ZANAFLEX ) tablet 2 mg  2 mg Oral QHS Samtani, Jai-Gurmukh, MD   2 mg at 05/17/24 2148   traMADol  (ULTRAM ) tablet 50-100 mg  50-100 mg Oral Q8H PRN Samtani, Jai-Gurmukh, MD   100 mg at 05/18/24 9149     Discharge Medications: Please see discharge summary for a list of discharge medications.  Relevant Imaging Results:  Relevant Lab Results:   Additional Information SSN: [id number]  Lendia Dais, LCSWA

## 2024-05-18 NOTE — Progress Notes (Signed)
 [facility] night cross cover note:   I was notified by the patient's RN that the patient's potassium level this morning of 2.6.  I ordered potassium chloride  40 mill colons p.o. x 1 dose now.  This is in addition to her existing order for potassium chloride  20 mill equivalents p.o. daily.     Eva Pore, DO Hospitalist

## 2024-05-18 NOTE — Progress Notes (Signed)
 Physical Therapy Treatment Patient Details Name: Hailey [name] MRN: [id number] DOB: [birth year] Today's Date: 05/18/2024   History of Present Illness 78 yo F adm 12/10 weakness urinay retention R pleural effusion 12/11 thoracentesis PMH hospitalized 11/28- 05/03/2024 bil pleural effusions, acute on chronic CHF, mixed pulmonary HTN, acute respiratory failure. 11/28 & 12/1 thoracentesis. 12/1 RHC. CAD, Afib, tachy-brady syndrome s/p PPM, HFpEF, HTN, DM2, hyperthyroidism, OSA.  Chest tube.    PT Comments  Pt is currently presenting at Min A for bed mobility, CGA for sit to stand with RW and CGA to Min A for short distance gait. Pt was Mod I to ind prior to initial hospitalization and cares for spouse. Pt does not have 24/7 assist from family and is alone with spouse during the day. Due to pt current functional status, home set up and available assistance at home recommending skilled physical therapy services < 3 hours/day in order to address strength, balance and functional mobility to decrease risk for falls, injury, immobility, skin break down and re-hospitalization.      If plan is discharge home, recommend the following: A little help with walking and/or transfers;A little help with bathing/dressing/bathroom;Assistance with cooking/housework;Assist for transportation   Can travel by private vehicle     Yes  Equipment Recommendations  BSC/3in1       Precautions / Restrictions Precautions Precautions: Fall Recall of Precautions/Restrictions: Intact Precaution/Restrictions Comments: chest tube, foley Restrictions Weight Bearing Restrictions Per Provider Order: No     Mobility  Bed Mobility Overal bed mobility: Needs Assistance Bed Mobility: Supine to Sit, Sit to Supine     Supine to sit: Min assist, HOB elevated, Used rails Sit to supine: Min assist, HOB elevated, Used rails   General bed mobility comments: Min A for trunk to midline and LE to EOB.    Transfers Overall transfer  level: Needs assistance Equipment used: Rolling walker (2 wheels) Transfers: Sit to/from Stand Sit to Stand: Contact guard assist           General transfer comment: CGA for safety/stabilization    Ambulation/Gait Ambulation/Gait assistance: Contact guard assist, Min assist Gait Distance (Feet): 100 Feet Assistive device: Rolling walker (2 wheels) Gait Pattern/deviations: Step-through pattern, Decreased stride length, Trunk flexed Gait velocity: decreased Gait velocity interpretation: <1.31 ft/sec, indicative of household ambulator   General Gait Details: Pt ambulates w/ reciprocal gait pattern, reduced step width, significant trunk flexion, with reduced hip and knee flexion throughout swing. VC for posture during ambulation      Balance Overall balance assessment: Needs assistance Sitting-balance support: No upper extremity supported, Feet supported Sitting balance-Leahy Scale: Fair Sitting balance - Comments: seated on EOB   Standing balance support: Reliant on assistive device for balance, During functional activity, Bilateral upper extremity supported Standing balance-Leahy Scale: Poor Standing balance comment: CGA to Min A for balance with gait.        Communication Communication Communication: Impaired Factors Affecting Communication: Hearing impaired  Cognition Arousal: Alert Behavior During Therapy: WFL for tasks assessed/performed   PT - Cognitive impairments: Safety/Judgement     Following commands: Intact      Cueing Cueing Techniques: Verbal cues     General Comments General comments (skin integrity, edema, etc.): SPO2 99% and HR 83 bpm on 2L O2 via [place] during activity      Pertinent Vitals/Pain Pain Assessment Pain Assessment: 0-10 Pain Score: 8  Pain Location: R flank at chest tube Pain Descriptors / Indicators: Grimacing, Guarding, Pressure, Tender Pain Intervention(s): Monitored during session,  Repositioned, Patient requesting pain meds-RN  notified    Home Living Family/patient expects to be discharged to:: Private residence Living Arrangements: Spouse/significant other Available Help at Discharge: Family;Available PRN/intermittently Type of Home: House Home Access: Level entry       Home Layout: One level Home Equipment: Agricultural Consultant (2 wheels);Shower seat;BSC/3in1 Additional Comments: helps care for spouse with ADLs who uses W/C, he also has caregivers 2-3 days wk x 6 hours who help with IADLs        PT Goals (current goals can now be found in the care plan section) Acute Rehab PT Goals Patient Stated Goal: to get stronger to be able to care for spouse PT Goal Formulation: With patient Time For Goal Achievement: 06/01/24 Potential to Achieve Goals: Good    Frequency    Min 1X/week      PT Plan  Continue with current POC        AM-PAC PT 6 Clicks Mobility   Outcome Measure  Help needed turning from your back to your side while in a flat bed without using bedrails?: A Little Help needed moving from lying on your back to sitting on the side of a flat bed without using bedrails?: A Little Help needed moving to and from a bed to a chair (including a wheelchair)?: A Little Help needed standing up from a chair using your arms (e.g., wheelchair or bedside chair)?: A Little Help needed to walk in hospital room?: A Little Help needed climbing 3-5 steps with a railing? : Total 6 Click Score: 16    End of Session Equipment Utilized During Treatment: Gait belt;Oxygen Activity Tolerance: Patient tolerated treatment well;Patient limited by pain;Patient limited by fatigue Patient left: in bed;with call bell/phone within reach;with bed alarm set Nurse Communication: Mobility status PT Visit Diagnosis: Other abnormalities of gait and mobility (R26.89);Difficulty in walking, not elsewhere classified (R26.2);Muscle weakness (generalized) (M62.81)     Time: 8861-8788 PT Time Calculation (min) (ACUTE ONLY): 33  min  Charges:    $Therapeutic Activity: 23-37 mins PT General Charges $$ ACUTE PT VISIT: 1 Visit                     Hailey [name], DPT, CLT  Acute Rehabilitation Services Office: [phone] (Secure chat preferred)    Hailey [name] 05/18/2024, 12:17 PM

## 2024-05-18 NOTE — TOC Progression Note (Signed)
 Transition of Care [facility]) - Progression Note    Patient Details  Name: Hailey [name] MRN: [id number] Date of Birth: 10-23-1945  [facility]) CM/SW Contact  Lendia Dais, [place] Phone Number: 05/18/2024, 3:55 PM  Clinical Narrative:  CSW spoke to pt at bedside and informed them of PT recs of SNF. Pt stated they were agreeable and wanted referrals to be sent to their zip code area and some in the [place] area. Pt has no preferences. CSW left medicare.gov list at bedside.  Referrals have been sent out in the HUB. CSW will continue to monitor for bed offers.                      Expected Discharge Plan and Services                                               Social Drivers of Health (SDOH) Interventions SDOH Screenings   Food Insecurity: No Food Insecurity (05/13/2024)  Housing: Low Risk (05/13/2024)  Transportation Needs: No Transportation Needs (05/13/2024)  Utilities: Not At Risk (05/13/2024)  Depression (PHQ2-9): Low Risk (06/18/2023)  Financial Resource Strain: Low Risk (11/03/2023)   Received from [facility]  Social Connections: Patient Declined (05/13/2024)  Tobacco Use: Low Risk (05/12/2024)    Readmission Risk Interventions     No data to display

## 2024-05-18 NOTE — Evaluation (Signed)
 Occupational Therapy Evaluation Patient Details Name: Hailey [name] MRN: [id number] DOB: March 24, 1946 Today's Date: 05/18/2024   History of Present Illness   78 yo F adm 12/10 weakness urinay retention R pleural effusion 12/11 thoracentesis PMH hospitalized 11/28- 05/03/2024 bil pleural effusions, acute on chronic CHF, mixed pulmonary HTN, acute respiratory failure. 11/28 & 12/1 thoracentesis. 12/1 RHC. CAD, Afib, tachy-brady syndrome s/p PPM, HFpEF, HTN, DM2, hyperthyroidism, OSA.  Chest tube.     Clinical Impressions Patient admitted for the diagnosis above.  Recent hospitalization earlier in December 2025.  Patient is needing up Mod A for lower body ADL due to pain at the chest tube site.  Increased time for bed mobility with supervision and HOB elevated.  Sit to stand with CGA.  OT will continue efforts in the acute setting.  Patient does not have the needed 24 hour assist at home to transition directly there.  Patient will benefit from continued inpatient follow up therapy, <3 hours/day.     If plan is discharge home, recommend the following:   A little help with walking and/or transfers;Assistance with cooking/housework;Assist for transportation;Help with stairs or ramp for entrance;A lot of help with bathing/dressing/bathroom     Functional Status Assessment   Patient has had a recent decline in their functional status and demonstrates the ability to make significant improvements in function in a reasonable and predictable amount of time.     Equipment Recommendations   None recommended by OT     Recommendations for Other Services         Precautions/Restrictions   Precautions Precautions: Fall Recall of Precautions/Restrictions: Intact Restrictions Weight Bearing Restrictions Per Provider Order: No     Mobility Bed Mobility Overal bed mobility: Needs Assistance Bed Mobility: Supine to Sit, Sit to Supine     Supine to sit: Supervision, HOB elevated Sit to  supine: Contact guard assist, HOB elevated   General bed mobility comments: increased time Patient Response: Cooperative  Transfers Overall transfer level: Needs assistance Equipment used: Rolling walker (2 wheels) Transfers: Sit to/from Stand Sit to Stand: Contact guard assist                  Balance Overall balance assessment: Needs assistance Sitting-balance support: No upper extremity supported, Feet supported Sitting balance-Leahy Scale: Fair     Standing balance support: Reliant on assistive device for balance Standing balance-Leahy Scale: Poor                             ADL either performed or assessed with clinical judgement   ADL Overall ADL's : Needs assistance/impaired Eating/Feeding: Independent;Sitting;Bed level   Grooming: Wash/dry hands;Wash/dry face;Set up;Sitting   Upper Body Bathing: Sitting;Supervision/ safety   Lower Body Bathing: Minimal assistance;Moderate assistance;Sit to/from stand   Upper Body Dressing : Minimal assistance;Sitting   Lower Body Dressing: Moderate assistance;Sit to/from stand   Toilet Transfer: Contact guard assist;Ambulation;Regular Toilet;Cueing for safety;Supervision/safety                   Vision Baseline Vision/History: 1 Wears glasses Patient Visual Report: No change from baseline       Perception Perception: Not tested       Praxis Praxis: Not tested       Pertinent Vitals/Pain Pain Assessment Pain Assessment: Faces Faces Pain Scale: Hurts even more Pain Location: R flank at chest tube Pain Descriptors / Indicators: Grimacing, Guarding, Pressure, Tender Pain Intervention(s): Monitored during session, Premedicated before  session     Extremity/Trunk Assessment Upper Extremity Assessment Upper Extremity Assessment: Overall WFL for tasks assessed   Lower Extremity Assessment Lower Extremity Assessment: Defer to PT evaluation   Cervical / Trunk Assessment Cervical / Trunk  Assessment: Kyphotic   Communication Communication Communication: No apparent difficulties Factors Affecting Communication: Hearing impaired   Cognition Arousal: Alert Behavior During Therapy: WFL for tasks assessed/performed Cognition: No apparent impairments                               Following commands: Intact       Cueing  General Comments   Cueing Techniques: Verbal cues   VSS   Exercises     Shoulder Instructions      Home Living Family/patient expects to be discharged to:: Private residence Living Arrangements: Spouse/significant other Available Help at Discharge: Family;Available PRN/intermittently Type of Home: House Home Access: Level entry     Home Layout: One level     Bathroom Shower/Tub: Producer, Television/film/video: Standard Bathroom Accessibility: Yes How Accessible: Accessible via walker Home Equipment: Rolling Walker (2 wheels);Shower seat;BSC/3in1   Additional Comments: helps care for spouse with ADLs who uses W/C, he also has caregivers 2-3 days wk x 6 hours who help with IADLs      Prior Functioning/Environment Prior Level of Function : Independent/Modified Independent             Mobility Comments: walks without AD unless in pain then utilizes it, ADLs Comments: Ind with ADLs, does not drive, cooks, children assist with grocery shopping and all transportation, assist with IADLs from spouse's PCA    OT Problem List: Impaired balance (sitting and/or standing);Decreased activity tolerance;Decreased strength;Decreased knowledge of use of DME or AE   OT Treatment/Interventions:        OT Goals(Current goals can be found in the care plan section)   Acute Rehab OT Goals Patient Stated Goal: Rehab and then home OT Goal Formulation: With patient Time For Goal Achievement: 06/01/24 Potential to Achieve Goals: Good   OT Frequency:  Min 2X/week    Co-evaluation              AM-PAC OT 6 Clicks Daily  Activity     Outcome Measure Help from another person eating meals?: None Help from another person taking care of personal grooming?: A Little Help from another person toileting, which includes using toliet, bedpan, or urinal?: A Little Help from another person bathing (including washing, rinsing, drying)?: A Lot Help from another person to put on and taking off regular upper body clothing?: A Little Help from another person to put on and taking off regular lower body clothing?: A Lot 6 Click Score: 17   End of Session Equipment Utilized During Treatment: Rolling walker (2 wheels) Nurse Communication: Mobility status  Activity Tolerance: Patient tolerated treatment well Patient left: in bed;with call bell/phone within reach  OT Visit Diagnosis: Other abnormalities of gait and mobility (R26.89);Muscle weakness (generalized) (M62.81)                Time: 0935-1000 OT Time Calculation (min): 25 min Charges:  OT General Charges $OT Visit: 1 Visit OT Evaluation $OT Eval Moderate Complexity: 1 Mod OT Treatments $Therapeutic Activity: 8-22 mins  05/18/2024  RP, OTR/L  Acute Rehabilitation Services  Office:  [phone]   Hailey [name] 05/18/2024, 10:09 AM

## 2024-05-18 NOTE — TOC Progression Note (Addendum)
 Transition of Care [facility]) - Progression Note    Patient Details  Name: Hailey [name] MRN: [id number] Date of Birth: 03-29-1946  [facility]) CM/SW Contact  Lendia Dais, [place] Phone Number: 05/18/2024, 10:13 AM  Clinical Narrative: Pt is not yet medically stable d/t chest tube. After further discussion with the medical team pt has declined comfort care and pending PT eval.  CSW continue to follow for dc needs.                        Expected Discharge Plan and Services                                               Social Drivers of Health (SDOH) Interventions SDOH Screenings   Food Insecurity: No Food Insecurity (05/13/2024)  Housing: Low Risk (05/13/2024)  Transportation Needs: No Transportation Needs (05/13/2024)  Utilities: Not At Risk (05/13/2024)  Depression (PHQ2-9): Low Risk (06/18/2023)  Financial Resource Strain: Low Risk (11/03/2023)   Received from [facility]  Social Connections: Patient Declined (05/13/2024)  Tobacco Use: Low Risk (05/12/2024)    Readmission Risk Interventions     No data to display

## 2024-05-18 NOTE — Progress Notes (Signed)
°  Progress Note   Patient: Hailey [name] FMW:[id number] DOB: July 09, 1945 DOA: 05/12/2024     5 DOS: the patient was seen and examined on 05/18/2024 at 10:56AM      Brief hospital course: 78 y.o. F with dCHF, pHTN on sildenafil , SSS and persAF s/p ablation, on Eliquis , hx PPM, CAD no PCI, hx HTN, DM, hyperthyroidism/goiter on methimazole  who presented with recurrent pleural effusion.  Pulmonary and CT surgery consulted.     Assessment and Plan: See previous summary from Dr. Royal from 12/13   Recurrent pleural effusion See summary from 12/15 - Chest tube management per pulmonology     Urinary retention -Continue indwelling Foley, outpatient urology follow-up   Hyponatremia Asymptomatic   Hypokalemia - Supplement potassium   Chronic diastolic congestive heart failure Pulmonary hypertension Coronary artery disease Acute CHF ruled out, appears euvolemic - Continue furosemide  and spironolactone    Persistent atrial fibrillation -Continue therapeutic dose Lovenox  - Hold home apixaban    Hyperthyroidism due to multinodular goiter s/p thyroidectomy - Continue methimazole            Subjective: No acute change, having a lot of pain from her chest tube, no fever, no cough.      Physical Exam: BP (!) 106/55 (BP Location: Right Arm)   Pulse 67   Temp 97.7 F (36.5 C)   Resp 18   Ht 4' 11 (1.499 m)   Wt 40.8 kg   SpO2 100%   BMI 18.18 kg/m   Thin adult female, sitting up in bed, interactive and appropriate RRR, no murmurs, no peripheral edema Respiratory rate normal, lungs clear without rales or wheezes, improved air movement of the right Abdomen soft, no tenderness palpation or guarding, no ascites or distention Attention normal, affect normal, judgment and insight appear normal  Data Reviewed: Basic metabolic panel shows worsening hyponatremia, worsening hypokalemia, normal renal function CBC shows mild anemia, no leukocytosis     Family  Communication:     Disposition: Status is: Inpatient         Author: Lonni SHAUNNA Dalton, MD 05/18/2024 2:59 PM  For on call review [email or web address].

## 2024-05-19 ENCOUNTER — Inpatient Hospital Stay (HOSPITAL_COMMUNITY)

## 2024-05-19 DIAGNOSIS — E041 Nontoxic single thyroid nodule: Secondary | ICD-10-CM | POA: Diagnosis not present

## 2024-05-19 DIAGNOSIS — J90 Pleural effusion, not elsewhere classified: Secondary | ICD-10-CM | POA: Diagnosis not present

## 2024-05-19 LAB — BASIC METABOLIC PANEL WITH GFR
Anion gap: 6 (ref 5–15)
BUN: 7 mg/dL — ABNORMAL LOW (ref 8–23)
CO2: 33 mmol/L — ABNORMAL HIGH (ref 22–32)
Calcium: 8.8 mg/dL — ABNORMAL LOW (ref 8.9–10.3)
Chloride: 91 mmol/L — ABNORMAL LOW (ref 98–111)
Creatinine, Ser: 0.6 mg/dL (ref 0.44–1.00)
GFR, Estimated: >60 mL/min (ref >60)
Glucose, Bld: 108 mg/dL — ABNORMAL HIGH (ref 70–99)
Potassium: 4.9 mmol/L (ref 3.5–5.1)
Sodium: 130 mmol/L — ABNORMAL LOW (ref 135–145)

## 2024-05-19 NOTE — Progress Notes (Signed)
°  Progress Note   Patient: Hailey [name] FMW:[id number] DOB: 1945-08-15 DOA: 05/12/2024     6 DOS: the patient was seen and examined on 05/19/2024 at 11:01AM      Brief hospital course: 79 y.o. F with dCHF, pHTN on sildenafil , SSS and persAF s/p ablation, on Eliquis , hx PPM, CAD no PCI, hx HTN, DM, hyperthyroidism/goiter on methimazole  who presented with recurrent pleural effusion.  Pulmonary and CT surgery consulted.     Assessment and Plan: See previous summary from Dr. Royal from 12/13   Recurrent pleural effusion See summary from 12/15 Output 1200 yesterday, a lot less today - CT entailed pleurodesis per pulmonology per Pulmonology     Urinary retention -Continue indwelling Foley, outpatient urology follow-up   Hyponatremia Asymptomatic   Hypokalemia Resolved   Chronic diastolic congestive heart failure Pulmonary hypertension Coronary artery disease Acute CHF ruled out Euvolemic - Continue furosemide  and spironolactone    Persistent atrial fibrillation -Hold apixaban , resume when cleared by pulmonology   Hyperthyroidism due to multinodular goiter s/p thyroidectomy - Continue methimazole            Subjective: No acute change, chest tube pain seems reasonably managed, no fever, no confusion, no respiratory symptoms.    Physical Exam: BP (!) 152/71   Pulse 67   Temp 98.1 F (36.7 C)   Resp 18   Ht 4' 11 (1.499 m)   Wt 40.8 kg   SpO2 100%   BMI 18.18 kg/m   Adult female, lying in bed, watching television RRR, no murmurs, no peripheral edema Respiratory rate normal, lungs somewhat diminished on the right but good lung sounds, still some crackles Abdomen soft, no tenderness palpation or guarding, no ascites or distention  Data Reviewed: Basic metabolic panel shows stable hyponatremia, resolved hypokalemia, normal renal function    Family Communication:     Disposition: Status is: Inpatient         Author: Lonni SHAUNNA Dalton, MD 05/19/2024 4:21 PM  For on call review [email or web address].

## 2024-05-19 NOTE — Progress Notes (Signed)
 NAMEJAMERA [name], MRN:  [id number], DOB:  1946-02-19, LOS: 6 ADMISSION DATE:  05/12/2024, CONSULTATION DATE:  05/19/2024 REFERRING MD:  Jonel Lonni SQUIBB, *, CHIEF COMPLAINT:  recurrent pleural effusion   History of Present Illness:   Hailey [name] is a 78 y.o. woman with extensive past medical history  In November she was admitted with worsening shortness of breath and was found to have right pleural effusion for which she underwent thoracentesis with IR x 2.  This was exudative based on LDH.  She was diuresed with IV Lasix .  She also has secondary pulmonary hypertension and is on sildenafil  per Dr. Cherrie.  During this admission she had autoimmune testing done due to uncertainty about the nature of the effusion.  Her ANA was positive.  Complement levels were normal.  Her ESR and CRP were elevated.  She also had right heart catheterization this admission which showed normal left-sided lung pressures and cardiac output.  The plan had been to follow-up as an outpatient to discuss additional pulmonary vasodilators given her mild elevation in PVR despite sildenafil  therapy.  She was started on empiric steroid therapy with prednisone  and told to follow-up with rheumatology as an outpatient for ongoing management of the effusion.  He was recommended be discharged to SNF but she declined and went home.  She presents now again with worsening shortness of breath, abdominal distention, effusion.  Pulmonary being consulted for management of the right pleural effusion.  Of note note , MBS performed last admission- no aspiration.   She has not yet seen rheumatology.    Pertinent  Medical History   type 2 diabetes,  coronary artery disease,  chronic diastolic heart failure,  permanent atrial fibrillation status post pacemaker,  hypothyroidism.  Significant Hospital Events: Including procedures, antibiotic start and stop dates in addition to other pertinent events   11/28 Thora 1.1 L   12/1 Thora 900 cc 12/11 Thora 800 cc  Interim History / Subjective:  No overnight events. Still has significant output from the chest tube. Made 150cc of fluid today from 7am till 2 PM.  Objective    Blood pressure 127/69, pulse 74, temperature 98.2 F (36.8 C), resp. rate 18, height 4' 11 (1.499 m), weight 40.8 kg, SpO2 95%.        Intake/Output Summary (Last 24 hours) at 05/19/2024 1541 Last data filed at 05/19/2024 0900 Gross per 24 hour  Intake 120 ml  Output 750 ml  Net -630 ml   Filed Weights   05/12/24 1503 05/13/24 1836  Weight: 42.2 kg 40.8 kg    Examination: Elderly women, not in distress Good bilateral air entry, chest tube site intact RRR, Nl S1,S2 Soft, Nt, ND, +BS  No peripheral edema  Pleural fluid with exudative features.    Resolved problem list   Assessment and Plan   Recurrent right pleural effusion +ANA 1:160 homogeneous, negative RA factor, negative dsDNA, negative CCP Acute on chronic HFpEF Pulmonary hypertension Group 1-2 -echo showed severely elevated RVSP 81 however RHC 12/1 showed much improved PA pressures 44/22 and PCW 11 Atrial fibrillation s/p ppm   Exudative effusion of unclear etiology.  Could be related to chronic inflammation versus pseudo exudate from heart failure.  Family in agreement with tube drainage and talc  pleurodesis.  Continues to have significant output from the tube >200 per day.  Will wait till the output is less than 200 today and then plan to perform talc  pleurodesis after.  Continue with daily chest x-ray.  Continue to hold Lovenox .  Pulmonary will continue to follow   Hailey LOISE Herter MD Pulmonary and Critical Care Medicine [facility] 05/19/2024 3:41 PM Pager: see AMION  If no response to pager, please call critical care on call (see AMION) until 7pm After 7:00 pm call Elink      Labs   CBC: Recent Labs  Lab 05/14/24 0445 05/15/24 1157 05/16/24 0623 05/17/24 0351 05/18/24 0506  WBC  12.4* 13.6* 8.9 8.6 10.5  NEUTROABS 10.2* 11.5* 7.1  --   --   HGB 12.1 11.5* 10.2* 10.6* 11.2*  HCT 37.8 35.9* 32.3* 33.5* 35.1*  MCV 80.6 81.0 81.2 82.3 82.6  PLT 495* 458* 425* 403* 420*    Basic Metabolic Panel: Recent Labs  Lab 05/15/24 1157 05/16/24 0623 05/17/24 0351 05/18/24 0506 05/19/24 0839  NA 126* 128* 131* 129* 130*  K 3.5 3.1* 3.0* 2.6* 4.9  CL 87* 94* 96* 91* 91*  CO2 30 27 25  32 33*  GLUCOSE 99 86 110* 207* 108*  BUN 8 6* <5* <5* 7*  CREATININE 0.85 0.61 0.66 0.64 0.60  CALCIUM  7.9* 7.6* 7.8* 7.4* 8.8*  MG  --   --  1.5* 2.2  --   PHOS  --   --   --  2.7  --    GFR: Estimated Creatinine Clearance: 36.7 mL/min (by C-G formula based on SCr of 0.6 mg/dL). Recent Labs  Lab 05/15/24 1157 05/16/24 0623 05/17/24 0351 05/18/24 0506  WBC 13.6* 8.9 8.6 10.5    Liver Function Tests: Recent Labs  Lab 05/12/24 1558 05/14/24 0445 05/15/24 1157 05/17/24 0351  AST 16 14* 17 13*  ALT 16 13 10 9   ALKPHOS 118 100 97 86  BILITOT 1.0 1.3* 1.2 1.1  PROT 6.6 5.4* 5.3* 4.7*  ALBUMIN  3.2* 2.5* 2.5* 2.0*   No results for input(s): LIPASE, AMYLASE in the last 168 hours. No results for input(s): AMMONIA in the last 168 hours.  ABG    Component Value Date/Time   PHART 7.415 (H) 11/25/2010 1435   PCO2ART 38.8 11/25/2010 1435   PO2ART 67.4 (L) 11/25/2010 1435   HCO3 33.7 (H) 05/03/2024 1154   TCO2 35 (H) 05/03/2024 1154   O2SAT 75 05/03/2024 1154     Coagulation Profile: Recent Labs  Lab 05/13/24 0534  INR 1.5*    Cardiac Enzymes: No results for input(s): CKTOTAL, CKMB, CKMBINDEX, TROPONINI in the last 168 hours.  HbA1C: Hgb A1c MFr Bld  Date/Time Value Ref Range Status  05/01/2024 09:35 AM 6.5 (H) 4.8 - 5.6 % Final    Comment:    (NOTE)         Prediabetes: 5.7 - 6.4         Diabetes: >6.4         Glycemic control for adults with diabetes: <7.0   05/13/2023 02:18 PM 7.3 (H) <5.7 % of total Hgb Final    Comment:    For someone  without known diabetes, a hemoglobin A1c value of 6.5% or greater indicates that they may have  diabetes and this should be confirmed with a follow-up  test. . For someone with known diabetes, a value <7% indicates  that their diabetes is well controlled and a value  greater than or equal to 7% indicates suboptimal  control. A1c targets should be individualized based on  duration of diabetes, age, comorbid conditions, and  other considerations. . Currently, no consensus exists regarding use of hemoglobin A1c for diagnosis of diabetes for  children. .     CBG: No results for input(s): GLUCAP in the last 168 hours.

## 2024-05-19 NOTE — Plan of Care (Signed)

## 2024-05-19 NOTE — TOC Progression Note (Signed)
 Transition of Care [facility]) - Progression Note    Patient Details  Name: Hailey [name] MRN: [id number] Date of Birth: 07-30-1945  Transition of Care [facility]) CM/SW Contact  Lendia Dais, [place] Phone Number: 05/19/2024, 1:45 PM  Clinical Narrative:  CSW spoke to pt at bedside to f/u on bed offers. Pt stated the her daughter Hailey [name] has the medicare.gov list and wants Hailey [name] to make the decision for a SNF.  10:30 - CSW spoke to [place] via phone and went over SNF that have offered a bed for the pt. Hailey [name] stated they have a preference of [facility] and [facility].   2:07 - CSW spoke to Laura via phone and informed them of [facility] declining and [facility] accepting. Hailey [name] stated that she would like to consider all options. CSW stated understanding and made Hailey [name] aware of the pt approaching medical stability and that decision is needed soon. Hailey [name] stated understanding.  CSW will continue to monitor.                         Expected Discharge Plan and Services                                               Social Drivers of Health (SDOH) Interventions SDOH Screenings   Food Insecurity: No Food Insecurity (05/13/2024)  Housing: Low Risk (05/13/2024)  Transportation Needs: No Transportation Needs (05/13/2024)  Utilities: Not At Risk (05/13/2024)  Depression (PHQ2-9): Low Risk (06/18/2023)  Financial Resource Strain: Low Risk (11/03/2023)   Received from [facility]  Social Connections: Patient Declined (05/13/2024)  Tobacco Use: Low Risk (05/12/2024)    Readmission Risk Interventions     No data to display

## 2024-05-20 ENCOUNTER — Inpatient Hospital Stay (HOSPITAL_COMMUNITY)

## 2024-05-20 DIAGNOSIS — J90 Pleural effusion, not elsewhere classified: Secondary | ICD-10-CM | POA: Diagnosis not present

## 2024-05-20 LAB — CBC
HCT: 33.7 % — ABNORMAL LOW (ref 36.0–46.0)
Hemoglobin: 10.6 g/dL — ABNORMAL LOW (ref 12.0–15.0)
MCH: 25.9 pg — ABNORMAL LOW (ref 26.0–34.0)
MCHC: 31.5 g/dL (ref 30.0–36.0)
MCV: 82.2 fL (ref 80.0–100.0)
Platelets: 359 K/uL (ref 150–400)
RBC: 4.1 MIL/uL (ref 3.87–5.11)
RDW: 15.6 % — ABNORMAL HIGH (ref 11.5–15.5)
WBC: 7.8 K/uL (ref 4.0–10.5)
nRBC: 0 % (ref 0.0–0.2)

## 2024-05-20 LAB — BASIC METABOLIC PANEL WITH GFR
Anion gap: 8 (ref 5–15)
BUN: 9 mg/dL (ref 8–23)
CO2: 32 mmol/L (ref 22–32)
Calcium: 8.5 mg/dL — ABNORMAL LOW (ref 8.9–10.3)
Chloride: 86 mmol/L — ABNORMAL LOW (ref 98–111)
Creatinine, Ser: 0.58 mg/dL (ref 0.44–1.00)
GFR, Estimated: >60 mL/min (ref >60)
Glucose, Bld: 83 mg/dL (ref 70–99)
Potassium: 3.9 mmol/L (ref 3.5–5.1)
Sodium: 127 mmol/L — ABNORMAL LOW (ref 135–145)

## 2024-05-20 MED ORDER — HYDROMORPHONE HCL 1 MG/ML IJ SOLN
0.5000 mg | Freq: Once | INTRAMUSCULAR | Status: AC | PRN
  Administered 2024-05-23: 0.5 mg via INTRAVENOUS
  Filled 2024-05-20: qty 0.5

## 2024-05-20 MED ORDER — TALC (STERITALC) POWDER FOR INTRAPLEURAL USE
4.0000 g | Freq: Once | INTRAPLEURAL | Status: AC
  Administered 2024-05-20: 12:00:00 4 g via INTRAPLEURAL
  Filled 2024-05-20: qty 4

## 2024-05-20 MED ORDER — LIDOCAINE HCL 1 % IJ SOLN
25.0000 mL | Freq: Once | INTRAMUSCULAR | Status: AC
  Administered 2024-05-20: 12:00:00 10 mL via INTRAPLEURAL
  Filled 2024-05-20: qty 25

## 2024-05-20 MED ORDER — HYDROMORPHONE HCL 1 MG/ML IJ SOLN
0.5000 mg | Freq: Once | INTRAMUSCULAR | Status: AC
  Administered 2024-05-20: 12:00:00 0.5 mg via INTRAVENOUS

## 2024-05-20 NOTE — Progress Notes (Signed)
°  Progress Note   Patient: Hailey [name] FMW:[id number] DOB: 26-Aug-1945 DOA: 05/12/2024     7 DOS: the patient was seen and examined on 05/20/2024 at 9:50AM      Brief hospital course: 78 y.o. F with dCHF, pHTN on sildenafil , SSS and persAF s/p ablation, on Eliquis , hx PPM, CAD no PCI, hx HTN, DM, hyperthyroidism/goiter on methimazole  who presented with recurrent pleural effusion.  Pulmonary and CT surgery consulted.     Assessment and Plan: See previous summary from Dr. Royal from 12/13   Recurrent pleural effusion See summary from 12/15 Only to 20 cc in the last 24 hours, discussed with the pulmonology, they plan to do pleurodesis today - CT and pleurodesis per pulmonology    Urinary retention -Continue indwelling Foley, plan for outpatient urology follow-up   Hyponatremia Asymptomatic   Hypokalemia Resolved   Chronic diastolic congestive heart failure Pulmonary hypertension Coronary artery disease Acute CHF ruled out Looks euvolemic, recent pulmonary capillary wedge pressure normal - Continue furosemide  and spironolactone    Persistent atrial fibrillation - Hold Eliquis  for now, resume when cleared by pulmonology   Hyperthyroidism due to multinodular goiter s/p thyroidectomy -Continue methimazole            Subjective: Patient is feeling okay, no fever, no confusion, no respiratory distress.     Physical Exam: BP (!) 121/53 (BP Location: Right Arm)   Pulse 70   Temp 98.3 F (36.8 C)   Resp 18   Ht 4' 11 (1.499 m)   Wt 40.8 kg   SpO2 100%   BMI 18.18 kg/m   Thin adult female, lying in bed, interactive and appropriate RRR, no murmurs, no peripheral edema Respiratory normal, lungs clear without rales or wheezes Abdomen soft, no tenderness palpation or guarding, no ascites or distention 220 cc of clearish additional fluid in the chest tube canister Neurostatus normal    Data Reviewed: Discussed with pulmonology Basic metabolic panel  shows hyponatremia, CBC shows anemia    Family Communication: Daughter by phone    Disposition: Status is: Inpatient         Author: Lonni SHAUNNA Dalton, MD 05/20/2024 11:14 AM  For on call review [email or web address].

## 2024-05-20 NOTE — Progress Notes (Signed)
 PT Cancellation Note  Patient Details Name: Hailey [name] MRN: [id number] DOB: 08/17/1945   Cancelled Treatment:    Reason Eval/Treat Not Completed: Patient at procedure or test/unavailable (MD in room to do Talc  procedure.  He asked PT to allow pt to rest at least 2 hours.  Will return as able.  Nurse made aware.)   Stephane JULIANNA Bevel 05/20/2024, 12:09 PM Braidyn Peace M,PT Acute Rehab Services [phone]

## 2024-05-20 NOTE — TOC Progression Note (Signed)
 Transition of Care [facility]) - Progression Note    Patient Details  Name: Hailey [name] MRN: [id number] Date of Birth: 07/12/1945  [facility] of Care [facility]) CM/SW Contact  Lendia Dais, [place] Phone Number: 05/20/2024, 12:01 PM  Clinical Narrative: CSW spoke to pt's daughter via phone who chose [facility]. CSW spoke to [place] of [facility] who stated they could take the pt tomorrow if medically stable. Pt has traditional medicare and does not need insurance auth.  CSW informed [name] that travel by ambulance is not medically necessary per PT. CSW mentioned that if family is not able to provide transportation the pt could travel by safe transport and explained what safe transport was. [name] stated understanding and was agreeable.  CSW will continue to follow.                        Expected Discharge Plan and Services                                               Social Drivers of Health (SDOH) Interventions SDOH Screenings   Food Insecurity: No Food Insecurity (05/13/2024)  Housing: Low Risk (05/13/2024)  Transportation Needs: No Transportation Needs (05/13/2024)  Utilities: Not At Risk (05/13/2024)  Depression (PHQ2-9): Low Risk (06/18/2023)  Financial Resource Strain: Low Risk (11/03/2023)   Received from [facility]  Social Connections: Patient Declined (05/13/2024)  Tobacco Use: Low Risk (05/12/2024)    Readmission Risk Interventions     No data to display

## 2024-05-20 NOTE — Progress Notes (Signed)
 NAMEFANNIE [name], MRN:  [id number], DOB:  [birth year], LOS: 7 ADMISSION DATE:  05/12/2024, CONSULTATION DATE:  05/20/2024 REFERRING MD:  Jonel Lonni SQUIBB, *, CHIEF COMPLAINT:  recurrent pleural effusion   History of Present Illness:   Hailey [name] is a 78 y.o. woman with extensive past medical history  In November she was admitted with worsening shortness of breath and was found to have right pleural effusion for which she underwent thoracentesis with IR x 2.  This was exudative based on LDH.  She was diuresed with IV Lasix .  She also has secondary pulmonary hypertension and is on sildenafil  per Dr. Bensimhon.  During this admission she had autoimmune testing done due to uncertainty about the nature of the effusion.  Her ANA was positive.  Complement levels were normal.  Her ESR and CRP were elevated.  She also had right heart catheterization this admission which showed normal left-sided lung pressures and cardiac output.  The plan had been to follow-up as an outpatient to discuss additional pulmonary vasodilators given her mild elevation in PVR despite sildenafil  therapy.  She was started on empiric steroid therapy with prednisone  and told to follow-up with rheumatology as an outpatient for ongoing management of the effusion.  He was recommended be discharged to SNF but she declined and went home.  She presents now again with worsening shortness of breath, abdominal distention, effusion.  Pulmonary being consulted for management of the right pleural effusion.  Of note note , MBS performed last admission- no aspiration.   She has not yet seen rheumatology.    Pertinent  Medical History   type 2 diabetes,  coronary artery disease,  chronic diastolic heart failure,  permanent atrial fibrillation status post pacemaker,  hypothyroidism.  Significant Hospital Events: Including procedures, antibiotic start and stop dates in addition to other pertinent events   11/28 Thora 1.1 L   12/1 Thora 900 cc 12/11 Thora 800 cc 12/18 chest tube with almost no output today.  Interim History / Subjective:  No overnight events.  Chest tube output has slowed down to less than 200 today.  Patient has no complaints Objective    Blood pressure (!) 121/53, pulse 70, temperature 98.3 F (36.8 C), resp. rate 18, height 4' 11 (1.499 m), weight 40.8 kg, SpO2 100%.        Intake/Output Summary (Last 24 hours) at 05/20/2024 1252 Last data filed at 05/20/2024 0700 Gross per 24 hour  Intake --  Output 1320 ml  Net -1320 ml   Filed Weights   05/12/24 1503 05/13/24 1836  Weight: 42.2 kg 40.8 kg    Examination: General: Elderly female not in acute distress Chest: Good bilateral air entry, chest tube site appears intact Heart: Regular rate and rhythm, normal S1, S2 Abdomen: Soft, nontender, nondistended Extremities: Warm, well-perfused  Pleural fluid with exudative features.  Reviewed chest x-ray today with chest tube in good position.  No evidence of pleural effusion recurrence or pneumothorax  Resolved problem list   Assessment and Plan   Recurrent right pleural effusion +ANA 1:160 homogeneous, negative RA factor, negative dsDNA, negative CCP Acute on chronic HFpEF Pulmonary hypertension Group 1-2 -echo showed severely elevated RVSP 81 however RHC 12/1 showed much improved PA pressures 44/22 and PCW 11 Atrial fibrillation s/p ppm   Exudative effusion that appears inflammatory.  Chest tube placement and talc  pleurodesis performed.  - Keep chest tube on suction after completing 1 hour clamping. -No flushing of the chest tube -Avoid NSAIDs  and steroids for the next 72 hours  Pulmonary will continue to follow   [name] MD Pulmonary and Critical Care Medicine [facility] 05/20/2024 12:52 PM Pager: see AMION  If no response to pager, please call critical care on call (see AMION) until 7pm After 7:00 pm call Elink      Labs   CBC: Recent Labs   Lab 05/14/24 0445 05/15/24 1157 05/16/24 0623 05/17/24 0351 05/18/24 0506 05/20/24 0437  WBC 12.4* 13.6* 8.9 8.6 10.5 7.8  NEUTROABS 10.2* 11.5* 7.1  --   --   --   HGB 12.1 11.5* 10.2* 10.6* 11.2* 10.6*  HCT 37.8 35.9* 32.3* 33.5* 35.1* 33.7*  MCV 80.6 81.0 81.2 82.3 82.6 82.2  PLT 495* 458* 425* 403* 420* 359    Basic Metabolic Panel: Recent Labs  Lab 05/16/24 0623 05/17/24 0351 05/18/24 0506 05/19/24 0839 05/20/24 0437  NA 128* 131* 129* 130* 127*  K 3.1* 3.0* 2.6* 4.9 3.9  CL 94* 96* 91* 91* 86*  CO2 27 25 32 33* 32  GLUCOSE 86 110* 207* 108* 83  BUN 6* <5* <5* 7* 9  CREATININE 0.61 0.66 0.64 0.60 0.58  CALCIUM  7.6* 7.8* 7.4* 8.8* 8.5*  MG  --  1.5* 2.2  --   --   PHOS  --   --  2.7  --   --    GFR: Estimated Creatinine Clearance: 36.7 mL/min (by C-G formula based on SCr of 0.58 mg/dL). Recent Labs  Lab 05/16/24 0623 05/17/24 0351 05/18/24 0506 05/20/24 0437  WBC 8.9 8.6 10.5 7.8    Liver Function Tests: Recent Labs  Lab 05/14/24 0445 05/15/24 1157 05/17/24 0351  AST 14* 17 13*  ALT 13 10 9   ALKPHOS 100 97 86  BILITOT 1.3* 1.2 1.1  PROT 5.4* 5.3* 4.7*  ALBUMIN  2.5* 2.5* 2.0*   No results for input(s): LIPASE, AMYLASE in the last 168 hours. No results for input(s): AMMONIA in the last 168 hours.  ABG    Component Value Date/Time   PHART 7.415 (H) 11/25/2010 1435   PCO2ART 38.8 11/25/2010 1435   PO2ART 67.4 (L) 11/25/2010 1435   HCO3 33.7 (H) 05/03/2024 1154   TCO2 35 (H) 05/03/2024 1154   O2SAT 75 05/03/2024 1154     Coagulation Profile: No results for input(s): INR, PROTIME in the last 168 hours.   Cardiac Enzymes: No results for input(s): CKTOTAL, CKMB, CKMBINDEX, TROPONINI in the last 168 hours.  HbA1C: Hgb A1c MFr Bld  Date/Time Value Ref Range Status  05/01/2024 09:35 AM 6.5 (H) 4.8 - 5.6 % Final    Comment:    (NOTE)         Prediabetes: 5.7 - 6.4         Diabetes: >6.4         Glycemic control for  adults with diabetes: <7.0   05/13/2023 02:18 PM 7.3 (H) <5.7 % of total Hgb Final    Comment:    For someone without known diabetes, a hemoglobin A1c value of 6.5% or greater indicates that they may have  diabetes and this should be confirmed with a follow-up  test. . For someone with known diabetes, a value <7% indicates  that their diabetes is well controlled and a value  greater than or equal to 7% indicates suboptimal  control. A1c targets should be individualized based on  duration of diabetes, age, comorbid conditions, and  other considerations. . Currently, no consensus exists regarding use of hemoglobin  A1c for diagnosis of diabetes for children. .     CBG: No results for input(s): GLUCAP in the last 168 hours.

## 2024-05-20 NOTE — Procedures (Signed)
 Chest Tube Pleurodesis Note  DANEA MANTER  [id number]  Oct 12, 1945  Date:05/20/2024  Time:12:27 PM   Provider Performing:Joene Gelder N Michi Herrmann   Procedure: Chemical Pleurodesis via Chest Tube (32560)  Indication(s) Induced scarring of pleural space  Consent Risks of the procedure as well as the alternatives and risks of each were explained to the patient and/or caregiver.  Consent for the procedure was obtained.   Anesthesia 25cc 1% Lidocaine  injected in with pleurodesis agents   Time Out Verified patient identification, verified procedure, site/side was marked, verified correct patient position, special equipment/implants available, medications/allergies/relevant history reviewed, required imaging and test results available.   Sterile Technique Hand hygiene, gloves   Procedure Description Existing pleural catheter was cleaned and accessed in sterile manner.  Talc  4g in 50cc saline slurry administered into pleural space.  The chest tube will be clamped and patient will rotate positions for 1 hour then chest tube will be placed to suction.   Complications/Tolerance None; patient tolerated the procedure well.   EBL None   Specimen(s) None

## 2024-05-20 NOTE — Plan of Care (Signed)

## 2024-05-21 ENCOUNTER — Inpatient Hospital Stay (HOSPITAL_COMMUNITY)

## 2024-05-21 DIAGNOSIS — J90 Pleural effusion, not elsewhere classified: Secondary | ICD-10-CM | POA: Diagnosis not present

## 2024-05-21 LAB — GLUCOSE, CAPILLARY: Glucose-Capillary: 145 mg/dL — ABNORMAL HIGH (ref 70–99)

## 2024-05-21 MED ORDER — SENNOSIDES-DOCUSATE SODIUM 8.6-50 MG PO TABS
1.0000 | ORAL_TABLET | Freq: Two times a day (BID) | ORAL | Status: DC
  Administered 2024-05-21 – 2024-05-24 (×6): 1 via ORAL
  Filled 2024-05-21 (×6): qty 1

## 2024-05-21 MED ORDER — BISACODYL 10 MG RE SUPP
10.0000 mg | Freq: Every day | RECTAL | Status: DC | PRN
  Administered 2024-05-21: 10 mg via RECTAL
  Filled 2024-05-21: qty 1

## 2024-05-21 MED ORDER — PROMETHAZINE (PHENERGAN) 6.25MG IN NS 50ML IVPB
6.2500 mg | Freq: Once | INTRAVENOUS | Status: AC
  Administered 2024-05-21: 6.25 mg via INTRAVENOUS
  Filled 2024-05-21: qty 6.25

## 2024-05-21 NOTE — Progress Notes (Signed)
 "  NAME:  Hailey [name], MRN:  [id number], DOB:  08/03/1945, LOS: 8 ADMISSION DATE:  05/12/2024, CONSULTATION DATE:  05/21/2024 REFERRING MD:  Jonel Lonni SQUIBB, *, CHIEF COMPLAINT:  recurrent pleural effusion   History of Present Illness:   Hailey [name] is a 78 y.o. woman with extensive past medical history  In November she was admitted with worsening shortness of breath and was found to have right pleural effusion for which she underwent thoracentesis with IR x 2.  This was exudative based on LDH.  She was diuresed with IV Lasix .  She also has secondary pulmonary hypertension and is on sildenafil  per Dr. Bensimhon.  During this admission she had autoimmune testing done due to uncertainty about the nature of the effusion.  Her ANA was positive.  Complement levels were normal.  Her ESR and CRP were elevated.  She also had right heart catheterization this admission which showed normal left-sided lung pressures and cardiac output.  The plan had been to follow-up as an outpatient to discuss additional pulmonary vasodilators given her mild elevation in PVR despite sildenafil  therapy.  She was started on empiric steroid therapy with prednisone  and told to follow-up with rheumatology as an outpatient for ongoing management of the effusion.  He was recommended be discharged to SNF but she declined and went home.  She presents now again with worsening shortness of breath, abdominal distention, effusion.  Pulmonary being consulted for management of the right pleural effusion.  Of note note , MBS performed last admission- no aspiration.   She has not yet seen rheumatology.    Pertinent  Medical History   type 2 diabetes,  coronary artery disease,  chronic diastolic heart failure,  permanent atrial fibrillation status post pacemaker,  hypothyroidism.  Significant Hospital Events: Including procedures, antibiotic start and stop dates in addition to other pertinent events   11/28 Thora 1.1 L   12/1 Thora 900 cc 12/11 Thora 800 cc 12/18 chest tube with almost no output today. Talc  pleurodesis performed   Interim History / Subjective:  No complaints overnight. Has some soreness at site on tube insertion  Objective    Blood pressure (!) 132/57, pulse 69, temperature 97.9 F (36.6 C), resp. rate 18, height 4' 11 (1.499 m), weight 40.8 kg, SpO2 100%.        Intake/Output Summary (Last 24 hours) at 05/21/2024 1144 Last data filed at 05/21/2024 0700 Gross per 24 hour  Intake 720 ml  Output 495 ml  Net 225 ml   Filed Weights   05/12/24 1503 05/13/24 1836  Weight: 42.2 kg 40.8 kg    Examination: General:  Elderly female not in distress Chest: Good bilateral air entry, chest tube site appears intact without any oozing or erythema Heart: Regular rate and rhythm, normal S1, normal S2 Abdomen: Soft, nontender, nondistended Extremities: Warm, well-perfused  Pleural fluid with exudative features.  Reviewed chest x-ray from today.  Chest tube in good position.  No evidence of worsening pleural effusion or pneumothorax.  Resolved problem list   Assessment and Plan   Recurrent right pleural effusion +ANA 1:160 homogeneous, negative RA factor, negative dsDNA, negative CCP Acute on chronic HFpEF Pulmonary hypertension Group 1-2 -echo showed severely elevated RVSP 81 however RHC 12/1 showed much improved PA pressures 44/22 and PCW 11 Atrial fibrillation s/p ppm  Exudative effusion of unclear etiology.  Status post chest tube placement.  Underwent talc  pleurodesis 05/20/2024.  Noted to have more than 200 cc of output in the chest  tube today.  Will keep chest tube and monitor.  Plan to DC chest tube once output is less than 200.   [name] MD Pulmonary and Critical Care Medicine [facility] 05/21/2024 11:44 AM Pager: see AMION  If no response to pager, please call critical care on call (see AMION) until 7pm After 7:00 pm call Elink      Labs    CBC: Recent Labs  Lab 05/15/24 1157 05/16/24 0623 05/17/24 0351 05/18/24 0506 05/20/24 0437  WBC 13.6* 8.9 8.6 10.5 7.8  NEUTROABS 11.5* 7.1  --   --   --   HGB 11.5* 10.2* 10.6* 11.2* 10.6*  HCT 35.9* 32.3* 33.5* 35.1* 33.7*  MCV 81.0 81.2 82.3 82.6 82.2  PLT 458* 425* 403* 420* 359    Basic Metabolic Panel: Recent Labs  Lab 05/16/24 0623 05/17/24 0351 05/18/24 0506 05/19/24 0839 05/20/24 0437  NA 128* 131* 129* 130* 127*  K 3.1* 3.0* 2.6* 4.9 3.9  CL 94* 96* 91* 91* 86*  CO2 27 25 32 33* 32  GLUCOSE 86 110* 207* 108* 83  BUN 6* <5* <5* 7* 9  CREATININE 0.61 0.66 0.64 0.60 0.58  CALCIUM  7.6* 7.8* 7.4* 8.8* 8.5*  MG  --  1.5* 2.2  --   --   PHOS  --   --  2.7  --   --    GFR: Estimated Creatinine Clearance: 36.7 mL/min (by C-G formula based on SCr of 0.58 mg/dL). Recent Labs  Lab 05/16/24 0623 05/17/24 0351 05/18/24 0506 05/20/24 0437  WBC 8.9 8.6 10.5 7.8    Liver Function Tests: Recent Labs  Lab 05/15/24 1157 05/17/24 0351  AST 17 13*  ALT 10 9  ALKPHOS 97 86  BILITOT 1.2 1.1  PROT 5.3* 4.7*  ALBUMIN  2.5* 2.0*   No results for input(s): LIPASE, AMYLASE in the last 168 hours. No results for input(s): AMMONIA in the last 168 hours.  ABG    Component Value Date/Time   PHART 7.415 (H) 11/25/2010 1435   PCO2ART 38.8 11/25/2010 1435   PO2ART 67.4 (L) 11/25/2010 1435   HCO3 33.7 (H) 05/03/2024 1154   TCO2 35 (H) 05/03/2024 1154   O2SAT 75 05/03/2024 1154     Coagulation Profile: No results for input(s): INR, PROTIME in the last 168 hours.   Cardiac Enzymes: No results for input(s): CKTOTAL, CKMB, CKMBINDEX, TROPONINI in the last 168 hours.  HbA1C: Hgb A1c MFr Bld  Date/Time Value Ref Range Status  05/01/2024 09:35 AM 6.5 (H) 4.8 - 5.6 % Final    Comment:    (NOTE)         Prediabetes: 5.7 - 6.4         Diabetes: >6.4         Glycemic control for adults with diabetes: <7.0   05/13/2023 02:18 PM 7.3 (H) <5.7 % of  total Hgb Final    Comment:    For someone without known diabetes, a hemoglobin A1c value of 6.5% or greater indicates that they may have  diabetes and this should be confirmed with a follow-up  test. . For someone with known diabetes, a value <7% indicates  that their diabetes is well controlled and a value  greater than or equal to 7% indicates suboptimal  control. A1c targets should be individualized based on  duration of diabetes, age, comorbid conditions, and  other considerations. . Currently, no consensus exists regarding use of hemoglobin A1c for diagnosis of diabetes for children. SABRA  CBG: Recent Labs  Lab 05/21/24 0058  GLUCAP 145*          "

## 2024-05-21 NOTE — Plan of Care (Signed)

## 2024-05-21 NOTE — Progress Notes (Signed)
 Physical Therapy Treatment Patient Details Name: Hailey [name] MRN: [id number] DOB: 1946-01-05 Today's Date: 05/21/2024   History of Present Illness 78 yo F adm 12/10 weakness urinay retention R pleural effusion 12/11 thoracentesis PMH hospitalized 11/28- 05/03/2024 bil pleural effusions, acute on chronic CHF, mixed pulmonary HTN, acute respiratory failure. 11/28 & 12/1 thoracentesis. 12/1 RHC. CAD, Afib, tachy-brady syndrome s/p PPM, HFpEF, HTN, DM2, hyperthyroidism, OSA.  Chest tube.    PT Comments  Pt seen for PT tx with pt agreeable, eager to participate. Pt is able to complete bed mobility with supervision, hospital bed features. Pt requires cuing re: hand placement during sit>stand transfers but ambulates in room with RW & CGA<>supervision. Gait distance limited by pain & nausea. Pt tolerated weaning to room air - nurse made aware. Will continue to follow pt acutely to progress mobility as able. Continue to recommend post acute rehab <3 hours therapy/day upon d/c.     If plan is discharge home, recommend the following: A little help with walking and/or transfers;A little help with bathing/dressing/bathroom;Assistance with cooking/housework;Assist for transportation   Can travel by private vehicle     Yes  Equipment Recommendations  BSC/3in1    Recommendations for Other Services       Precautions / Restrictions Precautions Precautions: Fall Precaution/Restrictions Comments: chest tube, foley Restrictions Weight Bearing Restrictions Per Provider Order: No     Mobility  Bed Mobility Overal bed mobility: Needs Assistance Bed Mobility: Supine to Sit     Supine to sit: Supervision, HOB elevated, Used rails (exit L side of bed)          Transfers   Equipment used: Rolling walker (2 wheels) Transfers: Sit to/from Stand Sit to Stand: Supervision           General transfer comment: ongoing cuing re: hand placement to push to standing     Ambulation/Gait Ambulation/Gait assistance: Supervision, Contact guard assist Gait Distance (Feet):  (15 + 30 ft) Assistive device: Rolling walker (2 wheels) Gait Pattern/deviations: Decreased step length - left, Decreased step length - right, Decreased stride length Gait velocity: decreased     General Gait Details: Gait distance limited by chest tube to wall suction (per nurse, cannot be disconnected at this time) so pt ambulated in the room, cuing re: need to ambulate within base of AD. Distance also limited by increased pain & nausea.   Stairs             Wheelchair Mobility     Tilt Bed    Modified Rankin (Stroke Patients Only)       Balance Overall balance assessment: Needs assistance Sitting-balance support: Feet supported Sitting balance-Leahy Scale: Good     Standing balance support: During functional activity, Reliant on assistive device for balance, Bilateral upper extremity supported Standing balance-Leahy Scale: Fair                              Hotel Manager: Impaired Factors Affecting Communication: Hearing impaired  Cognition Arousal: Alert Behavior During Therapy: WFL for tasks assessed/performed                             Following commands: Intact      Cueing Cueing Techniques: Verbal cues  Exercises      General Comments General comments (skin integrity, edema, etc.): Pt received on 2L/min via nasal cannula, weaned to room air, SpO2 92% or >  on room air & pt left on room air -- nurse made aware.      Pertinent Vitals/Pain Pain Assessment Pain Assessment: Faces Faces Pain Scale: Hurts even more Pain Location: R flank at chest tube Pain Descriptors / Indicators: Discomfort Pain Intervention(s): Monitored during session, RN gave pain meds during session, Patient requesting pain meds-RN notified, Repositioned, Limited activity within patient's tolerance    Home Living                           Prior Function            PT Goals (current goals can now be found in the care plan section) Acute Rehab PT Goals Patient Stated Goal: to get stronger to be able to care for spouse PT Goal Formulation: With patient Time For Goal Achievement: 06/01/24 Potential to Achieve Goals: Good Progress towards PT goals: Progressing toward goals    Frequency    Min 1X/week      PT Plan      Co-evaluation              AM-PAC PT 6 Clicks Mobility   Outcome Measure  Help needed turning from your back to your side while in a flat bed without using bedrails?: None Help needed moving from lying on your back to sitting on the side of a flat bed without using bedrails?: A Little Help needed moving to and from a bed to a chair (including a wheelchair)?: A Little Help needed standing up from a chair using your arms (e.g., wheelchair or bedside chair)?: A Little Help needed to walk in hospital room?: A Little Help needed climbing 3-5 steps with a railing? : A Little 6 Click Score: 19    End of Session Equipment Utilized During Treatment: Oxygen Activity Tolerance: Patient limited by pain (& nausea) Patient left: in chair;with call bell/phone within reach;with nursing/sitter in room Nurse Communication: Mobility status (O2 on room air) PT Visit Diagnosis: Other abnormalities of gait and mobility (R26.89);Difficulty in walking, not elsewhere classified (R26.2);Muscle weakness (generalized) (M62.81)     Time: 8675-8657 PT Time Calculation (min) (ACUTE ONLY): 18 min  Charges:    $Therapeutic Activity: 8-22 mins PT General Charges $$ ACUTE PT VISIT: 1 Visit                     Richerd Pinal, PT, DPT 05/21/2024, 1:51 PM  Richerd CHRISTELLA Pinal 05/21/2024, 1:50 PM

## 2024-05-21 NOTE — Progress Notes (Signed)
 Occupational Therapy Treatment Patient Details Name: Hailey [name] MRN: [id number] DOB: [birth year] Today's Date: 05/21/2024   History of present illness 78 yo F adm 12/10 weakness urinay retention R pleural effusion 12/11 thoracentesis PMH hospitalized 11/28- 05/03/2024 bil pleural effusions, acute on chronic CHF, mixed pulmonary HTN, acute respiratory failure. 11/28 & 12/1 thoracentesis. 12/1 RHC. CAD, Afib, tachy-brady syndrome s/p PPM, HFpEF, HTN, DM2, hyperthyroidism, OSA.  Chest tube.   OT comments  Patient with good progress toward patient focused goals.  Patient closer to supervision level with stand grooming and in room mobility/toileting at RW level.  OT to continue efforts in the acute setting to address deficits and Patient will benefit from continued inpatient follow up therapy, <3 hours/day.  Patient, even though progressing, doesn't have the needed 24 hour assist as needed to transition directly home.  Patient demonstrates the ability to reach a Mod I level with additional post acute rehab.        If plan is discharge home, recommend the following:  A little help with walking and/or transfers;Assistance with cooking/housework;Assist for transportation;Help with stairs or ramp for entrance;A lot of help with bathing/dressing/bathroom   Equipment Recommendations  None recommended by OT    Recommendations for Other Services      Precautions / Restrictions Precautions Precautions: Fall Recall of Precautions/Restrictions: Intact Precaution/Restrictions Comments: chest tube, foley Restrictions Weight Bearing Restrictions Per Provider Order: No       Mobility Bed Mobility   Bed Mobility: Supine to Sit, Sit to Supine     Supine to sit: Supervision, HOB elevated Sit to supine: Supervision     Patient Response: Cooperative  Transfers Overall transfer level: Needs assistance Equipment used: Rolling walker (2 wheels) Transfers: Sit to/from Stand Sit to Stand:  Supervision                 Balance Overall balance assessment: Needs assistance Sitting-balance support: No upper extremity supported, Feet supported Sitting balance-Leahy Scale: Good     Standing balance support: Reliant on assistive device for balance Standing balance-Leahy Scale: Fair                             ADL either performed or assessed with clinical judgement   ADL       Grooming: Wash/dry hands;Wash/dry face;Supervision/safety;Standing                   Toilet Transfer: Supervision/safety;Ambulation;Rolling walker (2 wheels)                  Extremity/Trunk Assessment Upper Extremity Assessment Upper Extremity Assessment: Overall WFL for tasks assessed   Lower Extremity Assessment Lower Extremity Assessment: Defer to PT evaluation   Cervical / Trunk Assessment Cervical / Trunk Assessment: Kyphotic    Vision Patient Visual Report: No change from baseline     Perception Perception Perception: Not tested   Praxis Praxis Praxis: Not tested   Communication Communication Communication: Impaired Factors Affecting Communication: Hearing impaired   Cognition Arousal: Alert Behavior During Therapy: WFL for tasks assessed/performed Cognition: No apparent impairments                               Following commands: Intact        Cueing   Cueing Techniques: Verbal cues  Exercises      Shoulder Instructions       General Comments  Pertinent Vitals/ Pain       Pain Assessment Pain Assessment: Faces Faces Pain Scale: Hurts a little bit Pain Location: R flank at chest tube Pain Descriptors / Indicators: Aching, Tender Pain Intervention(s): Monitored during session                                                          Frequency  Min 2X/week        Progress Toward Goals  OT Goals(current goals can now be found in the care plan section)  Progress towards OT  goals: Progressing toward goals  Acute Rehab OT Goals OT Goal Formulation: With patient Time For Goal Achievement: 06/01/24 Potential to Achieve Goals: Good  Plan      Co-evaluation                 AM-PAC OT 6 Clicks Daily Activity     Outcome Measure   Help from another person eating meals?: None Help from another person taking care of personal grooming?: A Little Help from another person toileting, which includes using toliet, bedpan, or urinal?: A Little Help from another person bathing (including washing, rinsing, drying)?: A Little Help from another person to put on and taking off regular upper body clothing?: A Little Help from another person to put on and taking off regular lower body clothing?: A Little 6 Click Score: 19    End of Session Equipment Utilized During Treatment: Rolling walker (2 wheels)  OT Visit Diagnosis: Muscle weakness (generalized) (M62.81);Pain Pain - Right/Left: Right   Activity Tolerance Patient tolerated treatment well   Patient Left in bed;with call bell/phone within reach   Nurse Communication Mobility status        Time: 9155-9096 OT Time Calculation (min): 19 min  Charges: OT General Charges $OT Visit: 1 Visit OT Treatments $Self Care/Home Management : 8-22 mins  05/21/2024  RP, OTR/L  Acute Rehabilitation Services  Office:  [phone]   Charlie JONETTA Halsted 05/21/2024, 9:06 AM

## 2024-05-21 NOTE — TOC Progression Note (Signed)
 Transition of Care [facility]) - Progression Note    Patient Details  Name: Hailey [name] MRN: [id number] Date of Birth: [birth year]  Transition of [facility]) CM/SW Contact  Lendia Dais, [place] Phone Number: 05/21/2024, 1:05 PM  Clinical Narrative: Pt is not yet medically stable for discharge. Pt still has output from their chest tube.  CSW spoke to [place] [facility] who stated they could receive the patient on Monday.  CSW will continue to follow.                      Expected Discharge Plan and Services                                               Social Drivers of Health (SDOH) Interventions SDOH Screenings   Food Insecurity: No Food Insecurity (05/13/2024)  Housing: Low Risk (05/13/2024)  Transportation Needs: No Transportation Needs (05/13/2024)  Utilities: Not At Risk (05/13/2024)  Depression (PHQ2-9): Low Risk (06/18/2023)  Financial Resource Strain: Low Risk (11/03/2023)   Received from [facility]  Social Connections: Patient Declined (05/13/2024)  Tobacco Use: Low Risk (05/12/2024)    Readmission Risk Interventions     No data to display

## 2024-05-21 NOTE — Progress Notes (Signed)
" °  Progress Note   Patient: Hailey [name] FMW:[id number] DOB: January 01, 1946 DOA: 05/12/2024     8 DOS: the patient was seen and examined on 05/21/2024        Brief hospital course: 78 y.o. F with dCHF, pHTN on sildenafil , SSS and persAF s/p ablation, on Eliquis , hx PPM, CAD no PCI, hx HTN, DM, hyperthyroidism/goiter on methimazole  who presented with recurrent pleural effusion.  Pulmonary and CT surgery consulted.     Assessment and Plan: See previous summary from Dr. Royal from 12/13   Recurrent pleural effusion See summary from 12/15 Still 170 cc in last 24 hours. - CT and pleurodesis per pulmonology    Urinary retention -Continue indwelling Foley, plan for outpatient urology follow-up   Hyponatremia Asymptomatic   Hypokalemia Resolved   Chronic diastolic congestive heart failure Pulmonary hypertension Coronary artery disease Acute CHF ruled out Looks euvolemic, recent pulmonary capillary wedge pressure normal No new dyspnea - Continue Lasix  and aldactone    Persistent atrial fibrillation - Hold Eliquis    Hyperthyroidism due to multinodular goiter s/p thyroidectomy - Continue Tapazole            Subjective: No new complaints.  A lot of nausea, No fever, no confusion, no new respiratory symptoms.     Physical Exam: BP (!) 130/54 (BP Location: Right Arm)   Pulse 78   Temp 98.2 F (36.8 C) (Oral)   Resp 19   Ht 4' 11 (1.499 m)   Wt 40.8 kg   SpO2 98%   BMI 18.18 kg/m   Thin adult female, lying in bed, interactive and appropriate RRR, no murmurs, no peripheral edema Respiratory normal, lungs clear without rales or wheezes Abdomen soft, no tenderness palpation or guarding, no ascites or distention     Data Reviewed: Discussed with Pulm   Family Communication: Daughter by phone    Disposition: Status is: Inpatient         Author: Lonni SHAUNNA Dalton, MD 05/21/2024 4:54 PM  For on call review [email or web address]    "

## 2024-05-22 DIAGNOSIS — R760 Raised antibody titer: Secondary | ICD-10-CM

## 2024-05-22 MED ORDER — HYDROCODONE-ACETAMINOPHEN 5-325 MG PO TABS
1.0000 | ORAL_TABLET | ORAL | Status: DC | PRN
  Administered 2024-05-22 – 2024-05-24 (×5): 1 via ORAL
  Filled 2024-05-22 (×5): qty 1

## 2024-05-22 NOTE — Progress Notes (Addendum)
" °  Progress Note   Patient: Hailey [name] FMW:[id number] DOB: 07/08/1945 DOA: 05/12/2024     9 DOS: the patient was seen and examined on 05/22/2024        Brief hospital course: 78 y.o. F with dCHF, pHTN on sildenafil , SSS and persAF s/p ablation, on Eliquis , hx PPM, CAD no PCI, hx HTN, DM, hyperthyroidism/goiter on methimazole  who presented with recurrent pleural effusion.  Pulmonary and CT surgery consulted.     Assessment and Plan: See previous summary from Dr. Royal from 12/13   Recurrent pleural effusion Patient was admitted and pulmonology were consulted.  She had a thoracentesis which showed resolving inflammation and pleural fluid.  Pulmonology discussed options and consulted CT surgery who recommended against surgery.  Decision was made to attempt talc  pleurodesis via chest tube.   Chest tube placed 12/15.  Initially >1L fluid output, now dwindled and underwent talc  pleurodesis 12/18 but output 220, 170 and 220 last 3 days - Trend output - CT and pleurodesis per pulmonology    Urinary retention -Continue indwelling Foley, plan for outpatient urology follow-up   Hyponatremia Chronic, asymptomatic.  Long-standing due to CHF. No further work up planned.   Hypokalemia Resolved   Chronic diastolic congestive heart failure Pulmonary hypertension Coronary artery disease Acute CHF ruled out Looks euvolemic, recent pulmonary capillary wedge pressure normal No new dyspnea - Continue Lasix  and aldactone    Persistent atrial fibrillation - Hold Eliquis    Hyperthyroidism due to multinodular goiter s/p thyroidectomy - Continue Tapazole            Subjective: No new complaints.  A lot of nausea again, No fever, no confusion, no new respiratory symptoms.     Physical Exam: BP (!) 119/56   Pulse 71   Temp 98.3 F (36.8 C)   Resp 18   Ht 4' 11 (1.499 m)   Wt 40.8 kg   SpO2 96%   BMI 18.18 kg/m   Thin adult female, lying in bed, interactive and  appropriate RRR, no murmurs, no peripheral edema Respiratory normal, lungs clear without rales or wheezes Abdomen soft, no tenderness palpation or guarding, no ascites or distention        Disposition: Status is: Inpatient         Author: Lonni SHAUNNA Dalton, MD 05/22/2024 6:11 PM  For on call review [email or web address]    "

## 2024-05-22 NOTE — Plan of Care (Signed)

## 2024-05-22 NOTE — Progress Notes (Signed)
 "  NAME:  Hailey [name], MRN:  [id number], DOB:  Jan 06, 1946, LOS: 9 ADMISSION DATE:  05/12/2024, CONSULTATION DATE:  05/22/2024 REFERRING MD:  Jonel Lonni SQUIBB, *, CHIEF COMPLAINT:  recurrent pleural effusion   History of Present Illness:   Hailey [name] is a 78 y.o. woman with extensive past medical history  In November she was admitted with worsening shortness of breath and was found to have right pleural effusion for which she underwent thoracentesis with IR x 2.  This was exudative based on LDH.  She was diuresed with IV Lasix .  She also has secondary pulmonary hypertension and is on sildenafil  per Dr. Cherrie.  During this admission she had autoimmune testing done due to uncertainty about the nature of the effusion.  Her ANA was positive.  Complement levels were normal.  Her ESR and CRP were elevated.  She also had right heart catheterization this admission which showed normal left-sided lung pressures and cardiac output.  The plan had been to follow-up as an outpatient to discuss additional pulmonary vasodilators given her mild elevation in PVR despite sildenafil  therapy.  She was started on empiric steroid therapy with prednisone  and told to follow-up with rheumatology as an outpatient for ongoing management of the effusion.  He was recommended be discharged to SNF but she declined and went home.  She presents now again with worsening shortness of breath, abdominal distention, effusion.  Pulmonary being consulted for management of the right pleural effusion.  Of note note , MBS performed last admission- no aspiration.   She has not yet seen rheumatology.    Pertinent  Medical History   type 2 diabetes,  coronary artery disease,  chronic diastolic heart failure,  permanent atrial fibrillation status post pacemaker,  hypothyroidism.  Significant Hospital Events: Including procedures, antibiotic start and stop dates in addition to other pertinent events   11/28 Thora 1.1 L   12/1 Thora 900 cc 12/11 Thora 800 cc 12/18 chest tube with almost no output today. Talc  pleurodesis performed   Interim History / Subjective:   220 CC outout from chest tube, no complaints  Objective    Blood pressure (!) 109/47, pulse 64, temperature 97.9 F (36.6 C), temperature source Oral, resp. rate 16, height 4' 11 (1.499 m), weight 40.8 kg, SpO2 100%.        Intake/Output Summary (Last 24 hours) at 05/22/2024 1005 Last data filed at 05/22/2024 0600 Gross per 24 hour  Intake 391.3 ml  Output 770 ml  Net -378.7 ml   Filed Weights   05/12/24 1503 05/13/24 1836  Weight: 42.2 kg 40.8 kg    Examination: Gen:      No acute distress HEENT:  EOMI, sclera anicteric Neck:     No masses; no thyromegaly Lungs:    Clear to auscultation bilaterally; normal respiratory effort CV:         Regular rate and rhythm; no murmurs Abd:      + bowel sounds; soft, non-tender; no palpable masses, no distension Ext:    No edema; adequate peripheral perfusion Neuro: alert and oriented x 3 Psych: normal mood and affect   Resolved problem list   Assessment and Plan   Recurrent right pleural effusion +ANA 1:160 homogeneous, negative RA factor, negative dsDNA, negative CCP Acute on chronic HFpEF Pulmonary hypertension Group 1-2 -echo showed severely elevated RVSP 81 however RHC 12/1 showed much improved PA pressures 44/22 and PCW 11 Atrial fibrillation s/p ppm  Exudative effusion of unclear etiology.  Status  post chest tube placement.  Underwent talc  pleurodesis 05/20/2024.   Continues to have over 220 output from chest tube Will monitor and remove once output drops to 150 or below.   Lonna Coder MD [facility] See Amion for pager  If no response to pager , please call [phone] until 7pm After 7:00 pm call Elink  [phone] 05/22/2024, 10:59 AM     Labs   CBC: Recent Labs  Lab 05/15/24 1157 05/16/24 0623 05/17/24 0351 05/18/24 0506  05/20/24 0437  WBC 13.6* 8.9 8.6 10.5 7.8  NEUTROABS 11.5* 7.1  --   --   --   HGB 11.5* 10.2* 10.6* 11.2* 10.6*  HCT 35.9* 32.3* 33.5* 35.1* 33.7*  MCV 81.0 81.2 82.3 82.6 82.2  PLT 458* 425* 403* 420* 359    Basic Metabolic Panel: Recent Labs  Lab 05/16/24 0623 05/17/24 0351 05/18/24 0506 05/19/24 0839 05/20/24 0437  NA 128* 131* 129* 130* 127*  K 3.1* 3.0* 2.6* 4.9 3.9  CL 94* 96* 91* 91* 86*  CO2 27 25 32 33* 32  GLUCOSE 86 110* 207* 108* 83  BUN 6* <5* <5* 7* 9  CREATININE 0.61 0.66 0.64 0.60 0.58  CALCIUM  7.6* 7.8* 7.4* 8.8* 8.5*  MG  --  1.5* 2.2  --   --   PHOS  --   --  2.7  --   --    GFR: Estimated Creatinine Clearance: 36.7 mL/min (by C-G formula based on SCr of 0.58 mg/dL). Recent Labs  Lab 05/16/24 0623 05/17/24 0351 05/18/24 0506 05/20/24 0437  WBC 8.9 8.6 10.5 7.8    Liver Function Tests: Recent Labs  Lab 05/15/24 1157 05/17/24 0351  AST 17 13*  ALT 10 9  ALKPHOS 97 86  BILITOT 1.2 1.1  PROT 5.3* 4.7*  ALBUMIN  2.5* 2.0*   No results for input(s): LIPASE, AMYLASE in the last 168 hours. No results for input(s): AMMONIA in the last 168 hours.  ABG    Component Value Date/Time   PHART 7.415 (H) 11/25/2010 1435   PCO2ART 38.8 11/25/2010 1435   PO2ART 67.4 (L) 11/25/2010 1435   HCO3 33.7 (H) 05/03/2024 1154   TCO2 35 (H) 05/03/2024 1154   O2SAT 75 05/03/2024 1154     Coagulation Profile: No results for input(s): INR, PROTIME in the last 168 hours.   Cardiac Enzymes: No results for input(s): CKTOTAL, CKMB, CKMBINDEX, TROPONINI in the last 168 hours.  HbA1C: Hgb A1c MFr Bld  Date/Time Value Ref Range Status  05/01/2024 09:35 AM 6.5 (H) 4.8 - 5.6 % Final    Comment:    (NOTE)         Prediabetes: 5.7 - 6.4         Diabetes: >6.4         Glycemic control for adults with diabetes: <7.0   05/13/2023 02:18 PM 7.3 (H) <5.7 % of total Hgb Final    Comment:    For someone without known diabetes, a hemoglobin  A1c value of 6.5% or greater indicates that they may have  diabetes and this should be confirmed with a follow-up  test. . For someone with known diabetes, a value <7% indicates  that their diabetes is well controlled and a value  greater than or equal to 7% indicates suboptimal  control. A1c targets should be individualized based on  duration of diabetes, age, comorbid conditions, and  other considerations. . Currently, no consensus exists regarding use of hemoglobin A1c  for diagnosis of diabetes for children. .     CBG: Recent Labs  Lab 05/21/24 0058  GLUCAP 145*          "

## 2024-05-22 NOTE — Plan of Care (Signed)
   Problem: Education: Goal: Knowledge of General Education information will improve Description: Including pain rating scale, medication(s)/side effects and non-pharmacologic comfort measures Outcome: Progressing   Problem: Activity: Goal: Risk for activity intolerance will decrease Outcome: Progressing

## 2024-05-22 NOTE — Plan of Care (Signed)

## 2024-05-23 ENCOUNTER — Telehealth: Payer: Self-pay | Admitting: Acute Care

## 2024-05-23 DIAGNOSIS — R531 Weakness: Secondary | ICD-10-CM | POA: Diagnosis not present

## 2024-05-23 LAB — COMPREHENSIVE METABOLIC PANEL WITH GFR
ALT: 9 U/L (ref 0–44)
AST: 15 U/L (ref 15–41)
Albumin: 2.7 g/dL — ABNORMAL LOW (ref 3.5–5.0)
Alkaline Phosphatase: 104 U/L (ref 38–126)
Anion gap: 5 (ref 5–15)
BUN: 12 mg/dL (ref 8–23)
CO2: 37 mmol/L — ABNORMAL HIGH (ref 22–32)
Calcium: 8.7 mg/dL — ABNORMAL LOW (ref 8.9–10.3)
Chloride: 84 mmol/L — ABNORMAL LOW (ref 98–111)
Creatinine, Ser: 0.85 mg/dL (ref 0.44–1.00)
GFR, Estimated: >60 mL/min
Glucose, Bld: 122 mg/dL — ABNORMAL HIGH (ref 70–99)
Potassium: 3.8 mmol/L (ref 3.5–5.1)
Sodium: 126 mmol/L — ABNORMAL LOW (ref 135–145)
Total Bilirubin: 0.5 mg/dL (ref 0.0–1.2)
Total Protein: 5.2 g/dL — ABNORMAL LOW (ref 6.5–8.1)

## 2024-05-23 LAB — CBC
HCT: 31.8 % — ABNORMAL LOW (ref 36.0–46.0)
Hemoglobin: 10.1 g/dL — ABNORMAL LOW (ref 12.0–15.0)
MCH: 25.8 pg — ABNORMAL LOW (ref 26.0–34.0)
MCHC: 31.8 g/dL (ref 30.0–36.0)
MCV: 81.3 fL (ref 80.0–100.0)
Platelets: 328 K/uL (ref 150–400)
RBC: 3.91 MIL/uL (ref 3.87–5.11)
RDW: 15.5 % (ref 11.5–15.5)
WBC: 7.1 K/uL (ref 4.0–10.5)
nRBC: 0 % (ref 0.0–0.2)

## 2024-05-23 MED ORDER — HYDROMORPHONE HCL 1 MG/ML IJ SOLN
INTRAMUSCULAR | Status: AC
  Filled 2024-05-23: qty 0.5

## 2024-05-23 MED ORDER — PROCHLORPERAZINE EDISYLATE 10 MG/2ML IJ SOLN
10.0000 mg | Freq: Four times a day (QID) | INTRAMUSCULAR | Status: DC | PRN
  Administered 2024-05-23: 10 mg via INTRAVENOUS
  Filled 2024-05-23: qty 2

## 2024-05-23 MED ORDER — FUROSEMIDE 40 MG PO TABS
40.0000 mg | ORAL_TABLET | Freq: Every day | ORAL | Status: DC
  Administered 2024-05-24: 40 mg via ORAL
  Filled 2024-05-23: qty 1

## 2024-05-23 NOTE — Progress Notes (Signed)
 "  NAME:  Hailey [name], MRN:  [id number], DOB:  08/14/1945, LOS: 10 ADMISSION DATE:  05/12/2024, CONSULTATION DATE:  05/23/2024 REFERRING MD:  Royal Sill, MD, CHIEF COMPLAINT:  recurrent pleural effusion   History of Present Illness:   Hailey [name] is a 78 y.o. woman with extensive past medical history  In November she was admitted with worsening shortness of breath and was found to have right pleural effusion for which she underwent thoracentesis with IR x 2.  This was exudative based on LDH.  She was diuresed with IV Lasix .  She also has secondary pulmonary hypertension and is on sildenafil  per Dr. Cherrie.  During this admission she had autoimmune testing done due to uncertainty about the nature of the effusion.  Her ANA was positive.  Complement levels were normal.  Her ESR and CRP were elevated.  She also had right heart catheterization this admission which showed normal left-sided lung pressures and cardiac output.  The plan had been to follow-up as an outpatient to discuss additional pulmonary vasodilators given her mild elevation in PVR despite sildenafil  therapy.  She was started on empiric steroid therapy with prednisone  and told to follow-up with rheumatology as an outpatient for ongoing management of the effusion.  He was recommended be discharged to SNF but she declined and went home.  She presents now again with worsening shortness of breath, abdominal distention, effusion.  Pulmonary being consulted for management of the right pleural effusion.  Of note note , MBS performed last admission- no aspiration.   She has not yet seen rheumatology.  Pertinent  Medical History  Type 2 diabetes,  coronary artery disease,  chronic diastolic heart failure,  permanent atrial fibrillation status post pacemaker,  hypothyroidism.  Significant Hospital Events: Including procedures, antibiotic start and stop dates in addition to other pertinent events   11/28 Thora 1.1 L  12/1  Thora 900 cc 12/11 Thora 800 cc 12/15 chest tube placed  12/18 chest tube with almost no output today. Talc  pleurodesis performed  12/20 220 CC outout from chest tube 12/21 chest tube output dropped to 30 mL in the last 24 hours, will remove chest tube today  Interim History / Subjective:  Patient seen lying in bed after eating breakfast with no acute complaints  Objective    Blood pressure (!) 97/47, pulse 66, temperature 97.9 F (36.6 C), resp. rate 18, height 4' 11 (1.499 m), weight 40.8 kg, SpO2 98%.        Intake/Output Summary (Last 24 hours) at 05/23/2024 0741 Last data filed at 05/23/2024 0429 Gross per 24 hour  Intake 980 ml  Output 705 ml  Net 275 ml   Filed Weights   05/12/24 1503 05/13/24 1836  Weight: 42.2 kg 40.8 kg    Examination: General: Acute on chronic ill-appearing elderly deconditioned female lying in bed in no acute distress HEENT: [place]/AT, MM pink/moist, PERRL,  Neuro: Alert and oriented x 3, globally weak CV: s1s2 regular rate and rhythm, no murmur, rubs, or gallops,  PULM: Slightly diminished breath sounds bilaterally no increased work of breathing, no added breath sounds, right chest tube in place GI: soft, bowel sounds active in all 4 quadrants, non-tender, non-distended, tolerating oral diet Extremities: warm/dry, no edema  Skin: no rashes or lesions  Resolved problem list   Assessment and Plan   Recurrent right pleural effusion -+ANA 1:160 homogeneous, negative RA factor, negative dsDNA, negative CCP -Exudative effusion of unclear etiology.  Status post chest tube placement.  Underwent  talc  pleurodesis 05/20/2024.   P: Chest tube output decreased to 30 mL in the last 24 hours, will remove chest tube today Ongoing frequent and adequate pulmonary hygiene Will send message to clinic to establish outpatient pulmonary follow-up Mobilize as able Aspiration precautions  Signature   Gianna Calef D. Harris, NP-C [facility] Personal contact information can be found on Amion  If no contact or response made please call [phone] 05/23/2024, 7:43 AM    "

## 2024-05-23 NOTE — Progress Notes (Signed)
 To bedside for CT removal per CCM order. Pt given PRN Dilaudid  prior to removal. She tolerated well with no complains of pain or SOB. Dressing placed and primary RN made aware.CXR ordered for am.

## 2024-05-23 NOTE — Progress Notes (Signed)
 Mobility Specialist: Progress Note   05/23/24 1500  Mobility  Activity Ambulated with assistance  Level of Assistance Standby assist, set-up cues, supervision of patient - no hands on  Assistive Device Front wheel walker  Distance Ambulated (ft) 100 ft  Activity Response Tolerated well  Mobility Referral Yes  Mobility visit 1 Mobility  Mobility Specialist Start Time (ACUTE ONLY) 0955  Mobility Specialist Stop Time (ACUTE ONLY) 1008  Mobility Specialist Time Calculation (min) (ACUTE ONLY) 13 min    Pt received in bed, agreeable to mobility session. SV throughout. Assist only needed for line management. Ambulated down the hallway to the christmas tree and back to room without fault. Left in bed with all needs met, call bell in reach.   Ileana Lute Mobility Specialist Please contact via SecureChat or Rehab office at [phone]

## 2024-05-23 NOTE — Progress Notes (Addendum)
 TRH   ROUNDING   NOTE Hailey [name] FMW:[id number]  DOB: June 20, 1945  DOA: 05/12/2024  PCP: Phyllis Mutter C, NP  05/23/2024,9:58 AM  LOS: 10 days    Code Status: Full code     from: Home   78 year old white female A-fib with tachybradycardia syndrome CHADVASC 6+ PPM-RF ablation 08/10/2013+ [facility] Moderate CAD 70% RCA stenosis on cath 2014 medically treated HFpEF + pulmonary hypertension  hospitalization [facility] 6/1 through 11/03/2023 acute CHF exacerbation EF 55% HTN Multinodular left thyroid  goiter status post hemithyroidectomy 09/17/2022 on methimazole  Pulmonary nodules diagnosed 06/2023 6 mm stable from 2018  Rehospitalized 11/28-12/09/21/2023 SOB progressive discomfort urinary retention-thoracentesis X2 for pleural effusion-workup including rheumatological follow-up performed-fluid looked exudative  Workup so far negative  12/1 right heart cath mild pulmonary arterial hypertension with normal left-sided pressures and cardiac output no change in PVR and sildenafil  was increased to 3 times daily and told follow-up to discuss macitentan versus sotatercept with advanced heart failure team  12/10 presented back to the emergency room with urinary retention and shortness of breath?  In the setting of also constipation underlying and a Foley catheter was placed   She was found to have relatively large effusion which was felt to be exudative-multiple specialists were involved in management and care Workup has so far revealed    [-] autoimmune process (DRV TT negative anti-Jo beta-2  glycoprotein Lupus anticoagulant cardiolipin Rheum factor CCP rheumatoid):--No malignant cells were identified and both cytology and pathology from 12/11 were negative   Pertinent imaging/studies till date  Sodium 126 potassium 3.3 BUN/creatinine 12/0.6 AST/ALT 16/16 bilirubin 1.0 WBC 15.6 platelet 551 hemoglobin 13.2 Troponin trend flat CXR progressive right basilar consolidation and enlarging right  pleural effusion  12/11 thoracentesis 800 cc--CT chest increasing right pulmonary effusions no findings of nodularity to suggest malignant effusion right lower lobe collapse consolidation felt secondary to atelectasis bilateral pulmonary nodules 8 mm?  Benign mild right hilar adenopathy likely benign 12/12 barium swallow unable to swallow barium tablet 13 mm spit up x 2 significantly limited study----chest x-ray persistent layering effusion underlying infiltrate not excluded CVTS consulted-not great candidate for intervention 12/15 chest tube placed 12/18 chemical pleurodesis performed by CCM 12/21 planning to remove PleurX catheter   Assessment  & Plan :    Large right-sided pleural effusion-exudative nature based on recent lab tests Exudative (65% neutrophils) effusion Prior steroids with tapering doses until 12/10 at last hospitalization ---hold abx and all steroids Appreciate pulmonology assistance unclear if any plans for lung biopsy PleurX catheter has had diminishing output and is now at 30 cc-probably will pull catheter and repeat x-ray in the morning and pulmonology will likely help with outpatient coordination of follow-up  Recurrent urinary retention possibly secondary to constipation Continues MiraLAX  17 twice daily senna 1 twice daily and as needed rectal Dulcolax suppository--- passing stool Foley catheter placed 12/10, continues Flomax  0.4--- voiding trial failed --needs outpatient urology follow-up  Hypovolemic hyponatremia on admission-serum osmolality 271 urine osmolality 347 urine sodium less than 30 losing weight, weight is down --- will get a standing weight today--previously was on salt tablets earlier in admission Lasix  adjusted to 40 daily discontinue Aldactone  25-recheck electrolytes in a.m.  chronic diastolic heart failure-do not think acute component Underlying mild pulmonary arterial hypertension Continue sildenafil  40 3 times daily--- diuretics as  above  Dysphagia since thyroidectomy -recent endoscopy 06/30/2023 by Dr. Cindie showed gastric polyps which were negative for malignancy but on PPI 40 twice daily which she is taking---overall she has  improved some although she still has some gagging-Carafate and Pepcid have been discontinued  Permanent A-fib CHADVASC 6 [facility] PPM on Eliquis  Eliquis  has been held on admission-Will need to start back Eliquis  in the next 24 hours once tube is out Currently on SCDs  Hyperthyroidism with multinodular goiter status post thyroidectomy 09/2022 on methimazole  Continuing Tapazole  20 daily-outpatient follow-up-would not workup further in hospital  Moderate CAD 2014 medically treatment Continues above meds   Data Reviewed today:  Labs pending this morning  DVT prophylaxis: Eliquis  has been held SCDs for now  Status is: Inpatient Inpatient pending resolution and pulmonary discussion    Dispo/Global plan: Inpatient   Time 20   Subjective:   Sitting up in bed looks comfortable no fever no chills Seems to be eating better although occasionally nauseous No chest pain States that there is pain at the site of the tube   Objective + exam Vitals:   05/22/24 1619 05/22/24 1958 05/23/24 0424 05/23/24 0925  BP: (!) 119/56 (!) 108/54 (!) 97/47 (!) 118/55  Pulse: 71 71 66 69  Resp: 18 18 18 18   Temp: 98.3 F (36.8 C) 98.2 F (36.8 C) 97.9 F (36.6 C)   TempSrc:      SpO2: 96% 98% 98% 98%  Weight:      Height:       Filed Weights   05/12/24 1503 05/13/24 1836  Weight: 42.2 kg 40.8 kg     Examination:  Coherent awake alert no distress--EOMI NCAT no focal deficit-S1-S2 no murmur--abdomen soft no rebound no guarding--neuro intact no focal deficit   Scheduled Meds:  Chlorhexidine  Gluconate Cloth  6 each Topical Daily   furosemide   40 mg Oral BID   methimazole   20 mg Oral Daily   mouth rinse  15 mL Mouth Rinse 4 times per day   pantoprazole   40 mg Oral BID   polyethylene  glycol  17 g Oral BID   senna-docusate  1 tablet Oral BID   sildenafil   40 mg Oral TID   spironolactone   25 mg Oral Daily   tiZANidine   2 mg Oral QHS   Continuous Infusions: acetaminophen  **OR** acetaminophen , albuterol , bisacodyl , fluticasone , HYDROcodone -acetaminophen , HYDROmorphone  (DILAUDID ) injection, ondansetron  **OR** ondansetron  (ZOFRAN ) IV, sodium chloride   Colen Grimes, MD  [facility]

## 2024-05-24 ENCOUNTER — Inpatient Hospital Stay (HOSPITAL_COMMUNITY)

## 2024-05-24 DIAGNOSIS — N139 Obstructive and reflux uropathy, unspecified: Secondary | ICD-10-CM | POA: Insufficient documentation

## 2024-05-24 DIAGNOSIS — E871 Hypo-osmolality and hyponatremia: Secondary | ICD-10-CM | POA: Insufficient documentation

## 2024-05-24 DIAGNOSIS — E785 Hyperlipidemia, unspecified: Secondary | ICD-10-CM | POA: Insufficient documentation

## 2024-05-24 DIAGNOSIS — R1310 Dysphagia, unspecified: Secondary | ICD-10-CM | POA: Insufficient documentation

## 2024-05-24 DIAGNOSIS — R339 Retention of urine, unspecified: Secondary | ICD-10-CM | POA: Insufficient documentation

## 2024-05-24 DIAGNOSIS — E876 Hypokalemia: Secondary | ICD-10-CM | POA: Insufficient documentation

## 2024-05-24 DIAGNOSIS — R531 Weakness: Secondary | ICD-10-CM | POA: Diagnosis not present

## 2024-05-24 DIAGNOSIS — E1140 Type 2 diabetes mellitus with diabetic neuropathy, unspecified: Secondary | ICD-10-CM | POA: Insufficient documentation

## 2024-05-24 DIAGNOSIS — G9341 Metabolic encephalopathy: Secondary | ICD-10-CM | POA: Insufficient documentation

## 2024-05-24 DIAGNOSIS — K5900 Constipation, unspecified: Secondary | ICD-10-CM | POA: Insufficient documentation

## 2024-05-24 LAB — BASIC METABOLIC PANEL WITH GFR
Anion gap: 12 (ref 5–15)
BUN: 11 mg/dL (ref 8–23)
CO2: 29 mmol/L (ref 22–32)
Calcium: 8.7 mg/dL — ABNORMAL LOW (ref 8.9–10.3)
Chloride: 85 mmol/L — ABNORMAL LOW (ref 98–111)
Creatinine, Ser: 0.7 mg/dL (ref 0.44–1.00)
GFR, Estimated: >60 mL/min
Glucose, Bld: 113 mg/dL — ABNORMAL HIGH (ref 70–99)
Potassium: 3.6 mmol/L (ref 3.5–5.1)
Sodium: 127 mmol/L — ABNORMAL LOW (ref 135–145)

## 2024-05-24 LAB — GLUCOSE, CAPILLARY: Glucose-Capillary: 150 mg/dL — ABNORMAL HIGH (ref 70–99)

## 2024-05-24 MED ORDER — SALINE NASAL SPRAY 0.65 % NA SOLN: 1.0000 | NASAL | Status: DC | PRN

## 2024-05-24 MED ORDER — FUROSEMIDE 40 MG PO TABS: 40.0000 mg | ORAL_TABLET | Freq: Every day | ORAL | Status: AC

## 2024-05-24 MED ORDER — TRAMADOL HCL 50 MG PO TABS: 50.0000 mg | ORAL_TABLET | Freq: Every day | ORAL | 0 refills | Status: DC | PRN

## 2024-05-24 NOTE — TOC Transition Note (Signed)
 Transition of Care [facility]) - Discharge Note   Patient Details  Name: Hailey [name] MRN: [id number] Date of Birth: [birth year]  [facility] CM/SW Contact:  Lendia Dais, LCSWA Phone Number: 05/24/2024, 1:27 PM   Clinical Narrative: Pt is discharging to [facility]. RN report to [phone]. [phone].  CSW left a VM for [name] (daughter) notifying her of discharge. RN informed the CSW that the pt's son is at bedside to transport the pt.  No further TOC needs.      Final next level of care: Skilled Nursing Facility Barriers to Discharge: Barriers Resolved   Patient Goals and CMS Choice            Discharge Placement              Patient chooses bed at: Other - please specify in the comment section below: [facility]) Patient to be transferred to facility by: Safe Transport Name of family member notified: [name] Stamp Patient and family notified of of transfer: 05/24/24  Discharge Plan and Services Additional resources added to the After Visit Summary for                                       Social Drivers of Health (SDOH) Interventions SDOH Screenings   Food Insecurity: No Food Insecurity (05/13/2024)  Housing: Low Risk (05/13/2024)  Transportation Needs: No Transportation Needs (05/13/2024)  Utilities: Not At Risk (05/13/2024)  Depression (PHQ2-9): Low Risk (06/18/2023)  Financial Resource Strain: Low Risk (11/03/2023)   Received from [facility]  Social Connections: Patient Declined (05/13/2024)  Tobacco Use: Low Risk (05/12/2024)     Readmission Risk Interventions     No data to display

## 2024-05-24 NOTE — Progress Notes (Signed)
 Physical Therapy Treatment Patient Details Name: Hailey [name] MRN: [id number] DOB: [birth year] Today's Date: 05/24/2024   History of Present Illness 78 yo F adm 12/10 weakness urinay retention R pleural effusion 12/11 thoracentesis PMH hospitalized 11/28- 05/03/2024 bil pleural effusions, acute on chronic CHF, mixed pulmonary HTN, acute respiratory failure. 11/28 & 12/1 thoracentesis. 12/1 RHC. CAD, Afib, tachy-brady syndrome s/p PPM, HFpEF, HTN, DM2, hyperthyroidism, OSA.  Chest tube.    PT Comments  Pt admitted with above diagnosis. Pt was able to ambulate with RW with cGA overall with steady gait and some min cues to stay close to RW.  Plan is to go to post acute rehab and likely pt going today.  Pt currently with functional limitations due to the deficits listed below (see PT Problem List). Pt will benefit from acute skilled PT to increase their independence and safety with mobility to allow discharge.       If plan is discharge home, recommend the following: A little help with walking and/or transfers;A little help with bathing/dressing/bathroom;Assistance with cooking/housework;Assist for transportation   Can travel by private vehicle        Equipment Recommendations  BSC/3in1    Recommendations for Other Services OT consult     Precautions / Restrictions Precautions Precautions: Fall Recall of Precautions/Restrictions: Intact Precaution/Restrictions Comments: foley Restrictions Weight Bearing Restrictions Per Provider Order: No     Mobility  Bed Mobility Overal bed mobility: Needs Assistance Bed Mobility: Supine to Sit     Supine to sit: Contact guard          Transfers Overall transfer level: Needs assistance Equipment used: Rolling walker (2 wheels) Transfers: Sit to/from Stand Sit to Stand: Supervision           General transfer comment: verbal cues for hand placement    Ambulation/Gait Ambulation/Gait assistance: Contact guard assist Gait Distance  (Feet): 150 Feet Assistive device: Rolling walker (2 wheels) Gait Pattern/deviations: Decreased step length - left, Decreased step length - right, Decreased stride length Gait velocity: decreased     General Gait Details: cuing re: need to ambulate within base of AD as she pushed it too far at times. Overall pt progressing gait and does well with RW.   Stairs             Wheelchair Mobility     Tilt Bed    Modified Rankin (Stroke Patients Only)       Balance Overall balance assessment: Needs assistance Sitting-balance support: Feet supported Sitting balance-Leahy Scale: Good     Standing balance support: During functional activity, Bilateral upper extremity supported Standing balance-Leahy Scale: Poor Standing balance comment: CGA to Min A for balance with gait.                            Communication Communication Communication: Impaired Factors Affecting Communication: Hearing impaired  Cognition Arousal: Alert Behavior During Therapy: WFL for tasks assessed/performed   PT - Cognitive impairments: Safety/Judgement                       PT - Cognition Comments: decreased awareness of current function Following commands: Intact      Cueing Cueing Techniques: Verbal cues  Exercises General Exercises - Lower Extremity Ankle Circles/Pumps: AROM, Both, 10 reps, Seated Long Arc Quad: AROM, Both, 10 reps, Seated Hip Flexion/Marching: AROM, Both, 10 reps, Seated    General Comments General comments (skin integrity, edema, etc.): VSS on  rA      Pertinent Vitals/Pain Pain Assessment Pain Assessment: No/denies pain    Home Living                          Prior Function            PT Goals (current goals can now be found in the care plan section) Progress towards PT goals: Progressing toward goals    Frequency    Min 1X/week      PT Plan      Co-evaluation              AM-PAC PT 6 Clicks Mobility    Outcome Measure  Help needed turning from your back to your side while in a flat bed without using bedrails?: None Help needed moving from lying on your back to sitting on the side of a flat bed without using bedrails?: A Little Help needed moving to and from a bed to a chair (including a wheelchair)?: A Little Help needed standing up from a chair using your arms (e.g., wheelchair or bedside chair)?: A Little Help needed to walk in hospital room?: A Little Help needed climbing 3-5 steps with a railing? : A Little 6 Click Score: 19    End of Session Equipment Utilized During Treatment: Gait belt Activity Tolerance: Patient limited by pain;Patient limited by fatigue Patient left: with call bell/phone within reach;in bed;with family/visitor present Nurse Communication: Mobility status PT Visit Diagnosis: Other abnormalities of gait and mobility (R26.89);Difficulty in walking, not elsewhere classified (R26.2);Muscle weakness (generalized) (M62.81)     Time: 8879-8868 PT Time Calculation (min) (ACUTE ONLY): 11 min  Charges:    $Gait Training: 8-22 mins PT General Charges $$ ACUTE PT VISIT: 1 Visit                     Doranne Schmutz M,PT Acute Rehab Services [phone]    Stephane JULIANNA Bevel 05/24/2024, 3:54 PM

## 2024-05-24 NOTE — Telephone Encounter (Signed)
 Patient scheduled.

## 2024-05-24 NOTE — Discharge Summary (Addendum)
 Physician Discharge Summary  Hailey [name] FMW:[id number] DOB: July 29, 1945 DOA: 05/12/2024  PCP: [name], NP  Admit date: 05/12/2024 Discharge date: 05/24/2024  Time spent: 44 minutes  Recommendations for Outpatient Follow-up:  Need Chem-12 CBC 1 week outpatient setting and adjust Lasix  as per labs--- dose Lasix  carefully also based on volume status-she has been losing weight since last admission Would recommend x-ray and 10 to 14 days and report this to Dr. Shelah of pulmonology Needs outpatient GI input regarding chronic dysphagia Please communicate with urology as an outpatient as will need voiding trial patient will discharge with Foley Requires TSH T4 in the outpatient setting in 3 to 4 weeks  Discharge Diagnoses:  MAIN problem for hospitalization   Recurrent left-sided exudative pleural effusion without clear etiology  Please see below for itemized issues addressed in HOpsital- refer to other progress notes for clarity if needed  Discharge Condition: Improved  Diet recommendation: Regular  Filed Weights   05/12/24 1503 05/13/24 1836  Weight: 42.2 kg 40.8 kg    History of present illness:  78 year old white female A-fib with tachybradycardia syndrome CHADVASC 6+ PPM-RF ablation 08/10/2013+ [facility] Moderate CAD 70% RCA stenosis on cath 2014 medically treated HFpEF + pulmonary hypertension             hospitalization [facility] 6/1 through 11/03/2023 acute CHF exacerbation EF 55% HTN Multinodular left thyroid  goiter status post hemithyroidectomy 09/17/2022 on methimazole  Pulmonary nodules diagnosed 06/2023 6 mm stable from 2018   Rehospitalized 11/28-12/09/21/2023 SOB progressive discomfort urinary retention-thoracentesis X2 for pleural effusion-workup including rheumatological follow-up performed-fluid looked exudative---work-up at the time showed severe lymphocytosis on Exudative pleural analysis             12/1 right heart cath mild pulmonary arterial  hypertension with normal left-sided pressures and cardiac output no change in PVR and sildenafil  was increased to 3 times daily and told follow-up to discuss macitentan versus sotatercept with advanced heart failure team   12/10 presented back to the emergency room with urinary retention and shortness of breath?  In the setting of also constipation underlying and a Foley catheter was placed  found to have relatively large effusion which was felt to be exudative-multiple specialists were involved in management and care Workup during hospitalization---[-] autoimmune process (DRV TT negative anti-Jo beta-2  glycoprotein Lupus anticoagulant cardiolipin Rheum factor CCP rheumatoid):--No malignant cells were identified and both cytology and pathology from 12/11 were negative CVTS Dr. Kerrin was consulted by pulmonary and felt the risk versus benefit outweighed operative management because of her overall frailty and biopsy was deferred at this time    Pertinent imaging/studies till date  Sodium 126 potassium 3.3 BUN/creatinine 12/0.6 AST/ALT 16/16 bilirubin 1.0 WBC 15.6 platelet 551 hemoglobin 13.2 Troponin trend flat CXR progressive right basilar consolidation and enlarging right pleural effusion   12/11 thoracentesis 800 cc--CT chest increasing right pulmonary effusions no findings of nodularity to suggest malignant effusion right lower lobe collapse consolidation felt secondary to atelectasis bilateral pulmonary nodules 8 mm?  Benign mild right hilar adenopathy likely benign 12/12 barium swallow unable to swallow barium tablet 13 mm spit up x 2 significantly limited study----chest x-ray persistent layering effusion underlying infiltrate not excluded CVTS consulted-not great candidate for intervention 12/15 chest tube placed 12/18 chemical pleurodesis performed by CCM 12/21 chest tube removed 12/20 repeat chest x-ray looks stable without pneumothorax    Assessment  & Plan :      Large  right-sided pleural effusion-exudative nature based on recent lab  tests hide cryptogenic cause Exudative (65% neutrophils) effusion Prior steroids with tapering doses until 12/10 at last hospitalization ---hold abx and all steroids Appreciate pulmonology assistance --- suboptimal surgical candidate given elderly frailty and high risk for perioperative morbidity PleurX catheter has had diminishing output ---catheter was pulled 12/21 , repeat x-ray 12/22 as above--- requires outpatient close follow-up-CC Dr. Ruther   Recurrent urinary retention possibly secondary to constipation having diarrhea so have stopped all laxatives all constipating agents-reevaluate as an outpatient Foley catheter placed 12/10, continues Flomax  0.4--- voiding trial failed --needs outpatient urology follow-up   Hypovolemic hyponatremia on admission-serum osmolality 271 urine osmolality 347 urine sodium less than 30 losing weight, weight is down ----previously was on salt tablets earlier in admission Lasix  adjusted to 40 daily at time of discharge and we did discontinue Aldactone  25 she maintains hyponatremia at discharge-would be very careful with dosing her Lasix  Adjust the same according to her sodium   chronic diastolic heart failure-do not think acute component Underlying mild pulmonary arterial hypertension Continue sildenafil  40 3 times daily--- diuretics as above   Dysphagia since thyroidectomy -recent endoscopy 06/30/2023 by Dr. Cindie showed gastric polyps which were negative for malignancy but on PPI 40 twice daily which she is taking---overall she has improved some although she still has some gagging-Carafate and Pepcid have been discontinued Overall she has improved   Permanent A-fib CHADVASC 6 [facility] PPM on Eliquis  Eliquis  held on admission-resumed at time of discharge    Hyperthyroidism with multinodular goiter status post thyroidectomy 09/2022 on methimazole  Continuing Tapazole  20 daily-outpatient  follow-up-would not workup further in hospital   Moderate CAD 2014 medically treatment Continues above meds  Discharge Exam: Vitals:   05/24/24 0452 05/24/24 0801  BP: 125/60 (!) 128/100  Pulse: 64 65  Resp: 18 18  Temp: 98.1 F (36.7 C) 98.3 F (36.8 C)  SpO2: 100% 97%    Subj on day of d/c   Awake pleasant coherent smiles eating drinking son at bedside no distress pain is controlled  General Exam on discharge  EOMI NCAT frail cachectic white female no distress neck soft supple S1-S2 no murmur no rub no gallop abdomen soft no rebound no guarding ROM intact-chest clear no wheeze-bandage removed Lower extremities are soft no rebound no guarding  Discharge Instructions   Discharge Instructions     Increase activity slowly   Complete by: As directed       Allergies as of 05/24/2024       Reactions   Codeine Nausea And Vomiting   Ketorolac  Nausea And Vomiting, Other (See Comments)   pt told to never take this med   Meperidine  Nausea Only, Nausea And Vomiting   Multaq  [dronedarone ] Other (See Comments)   Increased fluid retention   Xarelto [rivaroxaban] Diarrhea   Flexeril  [cyclobenzaprine ] Nausea And Vomiting   Latex Rash   Pt stated on 09/20/2022        Medication List     STOP taking these medications    loperamide  2 MG tablet Commonly known as: IMODIUM  A-D   loratadine  10 MG tablet Commonly known as: Claritin    polyethylene glycol powder 17 GM/SCOOP powder Commonly known as: GLYCOLAX /MIRALAX    potassium chloride  SA 20 MEQ tablet Commonly known as: KLOR-CON  M   predniSONE  10 MG tablet Commonly known as: DELTASONE    spironolactone  25 MG tablet Commonly known as: ALDACTONE    Stool Softener/Laxative 50-8.6 MG tablet Generic drug: senna-docusate   Vitamin D3 25 MCG (1000 UT) Caps  TAKE these medications    acetaminophen  500 MG tablet Commonly known as: TYLENOL  Take 1,000 mg by mouth daily as needed for mild pain (pain score 1-3)  or moderate pain (pain score 4-6).   albuterol  108 (90 Base) MCG/ACT inhaler Commonly known as: VENTOLIN  HFA Inhale 2 puffs into the lungs every 6 (six) hours as needed for wheezing or shortness of breath.   Eliquis  5 MG Tabs tablet Generic drug: apixaban  TAKE ONE TABLET BY MOUTH TWICE DAILY   fluticasone  50 MCG/ACT nasal spray Commonly known as: FLONASE  Place 2 sprays into both nostrils daily as needed for allergies or rhinitis. What changed: when to take this   furosemide  40 MG tablet Commonly known as: LASIX  Take 1 tablet (40 mg total) by mouth daily. Start taking on: May 25, 2024 What changed: when to take this   methimazole  10 MG tablet Commonly known as: TAPAZOLE  Take 20 mg (two tablets) daily.   ondansetron  4 MG tablet Commonly known as: ZOFRAN  TAKE ONE TABLET BY MOUTH EVERY EIGHT HOURS AS NEEDED FOR NAUSEA OR VOMITING   pantoprazole  40 MG tablet Commonly known as: PROTONIX  TAKE ONE TABLET BY MOUTH TWICE DAILY BEFORE A MEAL What changed: See the new instructions.   sildenafil  20 MG tablet Commonly known as: REVATIO  Take 2 tablets (40 mg total) by mouth 3 (three) times daily.   sodium chloride  0.65 % nasal spray Commonly known as: Nasal Moisturizing Spray Place 1 spray into the nose as needed for congestion.   tamsulosin  0.4 MG Caps capsule Commonly known as: FLOMAX  Take 1 capsule (0.4 mg total) by mouth daily.   tiZANidine  2 MG tablet Commonly known as: ZANAFLEX  Take 2 mg by mouth at bedtime.   traMADol  50 MG tablet Commonly known as: ULTRAM  Take 1 tablet (50 mg total) by mouth daily as needed for moderate pain (pain score 4-6) or severe pain (pain score 7-10).       Allergies[1]  Contact information for follow-up providers     Medina-Vargas, Monina C, NP Follow up.   Specialty: Internal Medicine Contact information: [address] [place] [address] [phone]              Contact information for after-discharge care      Destination     [facility] .   Service: Skilled Nursing Contact information: [address] [place]  [address] [phone]                      The results of significant diagnostics from this hospitalization (including imaging, microbiology, ancillary and laboratory) are listed below for reference.    Significant Diagnostic Studies: DG CHEST PORT 1 VIEW Result Date: 05/24/2024 CLINICAL DATA:  Chest tube removal. EXAM: PORTABLE CHEST 1 VIEW COMPARISON:  05/21/2024 FINDINGS: Right-sided pleural drain is been removed in the interval. No discernible pneumothorax. Right base atelectasis with trace effusion evident. Cardiopericardial silhouette is at upper limits of normal for size. Left-sided permanent pacemaker again noted. IMPRESSION: Interval removal of right-sided pleural drain without discernible pneumothorax. Electronically Signed   By: Camellia Candle M.D.   On: 05/24/2024 07:24   DG CHEST PORT 1 VIEW Result Date: 05/21/2024 EXAM: 1 VIEW(S) XRAY OF THE CHEST 05/21/2024 08:08:00 AM COMPARISON: 05/20/2024 CLINICAL HISTORY: Pleural effusion. FINDINGS: LINES, TUBES AND DEVICES: Right basilar chest tube stable in position. Left chest cardiac pacing device in place. LUNGS AND PLEURA: No focal pulmonary opacity. No pleural effusion. No pneumothorax. HEART AND MEDIASTINUM:  Atherosclerotic calcifications. Left chest cardiac pacing device in place. Similar rightward deviation of the trachea due to the enlarged left thyroid  lobe . BONES AND SOFT TISSUES: No acute osseous abnormality. Cholecystectomy clips noted. IMPRESSION: 1. No change from 05/20/2024. Electronically signed by: Norman Gatlin MD 05/21/2024 08:06 PM EST RP Workstation: [id number]   DG CHEST PORT 1 VIEW Result Date: 05/20/2024 CLINICAL DATA:  Pleural effusion. EXAM: PORTABLE CHEST 1 VIEW COMPARISON:  05/19/2024 FINDINGS: Left-sided pacemaker unchanged. Right basilar chest tube unchanged. Lungs  are adequately inflated. No acute airspace consolidation or effusion. No pneumothorax. Mild hazy density over the medial left apex unchanged compatible with known large goiter. Cardiomediastinal silhouette and remainder of the exam is unchanged. IMPRESSION: 1. No acute cardiopulmonary disease. 2. Right basilar chest tube unchanged. No pneumothorax. Electronically Signed   By: Toribio Agreste M.D.   On: 05/20/2024 11:18   DG CHEST PORT 1 VIEW Result Date: 05/19/2024 EXAM: 1 VIEW(S) XRAY OF THE CHEST 05/19/2024 01:58:00 PM COMPARISON: 05/18/2024 CLINICAL HISTORY: Pleural effusion FINDINGS: LINES, TUBES AND DEVICES: Stable right chest pigtail catheter. Left chest wall pacemaker in similar position. LUNGS AND PLEURA: Chronic coarsened interstitial markings without overt pulmonary edema. Interval decrease in trace right pleural effusion. No pneumothorax. HEART AND MEDIASTINUM: Aortic calcification. Right tracheal deviation due to left thyroid  nodule as seen on prior CT. The cardiac silhouette shows no acute abnormality. BONES AND SOFT TISSUES: No acute osseous abnormality. IMPRESSION: 1. Stable right chest pigtail catheter. 2. Interval decrease in trace right pleural effusion. Electronically signed by: Greig Pique MD 05/19/2024 05:51 PM EST RP Workstation: [id number]   DG CHEST PORT 1 VIEW Result Date: 05/18/2024 EXAM: 1 VIEW(S) XRAY OF THE CHEST 05/18/2024 06:03:29 AM COMPARISON: 05/17/2024 CLINICAL HISTORY: Pleural effusion. FINDINGS: LINES, TUBES AND DEVICES: Right lung base pleural drainage catheter stable in place. LUNGS AND PLEURA: Decreased right pleural effusion with adjacent right basilar patchy opacity. No pneumothorax. HEART AND MEDIASTINUM: Stable dual lead left chest cardiac pacing device. BONES AND SOFT TISSUES: Right upper quadrant surgical clips noted. No acute osseous abnormality. IMPRESSION: 1. Decreased right pleural effusion with adjacent right basilar patchy opacity. 2. Right lung base pleural  drainage catheter is stable in position. . Electronically signed by: Waddell Calk MD 05/18/2024 07:51 AM EST RP Workstation: [id number]   DG CHEST PORT 1 VIEW Result Date: 05/17/2024 CLINICAL DATA:  Status post chest tube placement. EXAM: PORTABLE CHEST 1 VIEW COMPARISON:  05/15/2024 FINDINGS: Interval right basilar pigtail pleural catheter. Mildly improved moderate-sized right pleural effusion. Decreased right basilar atelectasis. No pneumothorax. Clear left lung with normal vascularity. Stable mildly enlarged cardiac silhouette. Tortuous and partially calcified thoracic aorta. Stable left subclavian pacemaker leads. Thoracic and lumbar spine degenerative changes and right upper quadrant abdominal surgical clips. IMPRESSION: 1. Mildly improved moderate-sized right pleural effusion following right basilar pigtail pleural catheter placement. 2. No pneumothorax. 3. Decreased right basilar atelectasis. Electronically Signed   By: Elspeth Bathe M.D.   On: 05/17/2024 14:45   DG CHEST PORT 1 VIEW Result Date: 05/15/2024 EXAM: 1 VIEW(S) XRAY OF THE CHEST 05/15/2024 12:22:00 PM COMPARISON: 05/14/2024 CLINICAL HISTORY: 78 year old female with stable rightward tracheal deviation due to thyroid  goiter/tumor. Progressed moderate right pleural effusion and compressive atelectasis. FINDINGS: LINES, TUBES AND DEVICES: Left chest cardiac pacing device noted. LUNGS AND PLEURA: Confluent and veiling right lung base opacity has mildly increased, compatible with progressed and now moderate right pleural effusion with atelectasis or consolidation. No pneumothorax. HEART AND MEDIASTINUM: Stable rightward deviation of the trachea at  the thoracic inlet related to large thyroid  goiter or tumor. Atherosclerotic aortic calcifications. No acute abnormality of the cardiac and mediastinal silhouettes. BONES AND SOFT TISSUES: No acute osseous abnormality. IMPRESSION: 1. Moderate right pleural effusion, mildly increased compared to prior.  Associated atelectasis or consolidation. 2. No new cardiopulmonary abnormality. Electronically signed by: Helayne Hurst MD 05/15/2024 12:47 PM EST RP Workstation: [id number]   DG ESOPHAGUS W SINGLE CM (SOL OR THIN BA) Result Date: 05/14/2024 CLINICAL DATA:  History of dysphagia x1 year since partial thyroidectomy. EXAM: ESOPHAGUS/BARIUM SWALLOW/TABLET STUDY TECHNIQUE: Single contrast examination was performed using thin liquid barium. This exam was performed by Kimble Clas, PA-C, and was supervised and interpreted by Dr. Norleen Kil. FLUOROSCOPY: Radiation Exposure Index (as provided by the fluoroscopic device): 8.10 mGy Kerma COMPARISON:  None Available. FINDINGS: Exam significantly limited secondary to poor patient mobility, unable to stand or roll on fluoroscopy table. Exam also limited secondary to patient inability to perform rapid swallowing. Swallowing: Unable to assess due to inability to position patient. Pharynx: Unable to assess due to inability to position patient. Esophagus: Limited evaluation. No evidence of esophageal obstruction or mass. No definite stricture seen although esophagus could not be completely distended. Esophageal motility: Limited assessment due to inability to place patient in prone RAO position. Hiatal Hernia: None seen. Gastroesophageal reflux: Unable to assess. Ingested 13 mm barium tablet: Patient unable to swallow pill. Attempted x2 but patient spit up both times. Other: None. IMPRESSION: Significantly limited study. No evidence of esophageal obstruction or hiatal hernia. Electronically Signed   By: Norleen DELENA Kil M.D.   On: 05/14/2024 16:09   DG CHEST PORT 1 VIEW Result Date: 05/14/2024 CLINICAL DATA:  Right pleural effusion. EXAM: PORTABLE CHEST 1 VIEW COMPARISON:  04/13/2024. FINDINGS: The heart size and mediastinal contours are unchanged. Aortic atherosclerosis. Stable left subclavian pacer leads. Persistent small layering right pleural effusion with right basilar  atelectasis. Underlying infiltrate cannot be excluded. The left lung appears clear. No pneumothorax. IMPRESSION: Persistent small layering right pleural effusion with right basilar atelectasis. Underlying infiltrate cannot be excluded. Electronically Signed   By: Harrietta Sherry M.D.   On: 05/14/2024 10:15   IR THORACENTESIS ASP PLEURAL SPACE W/IMG GUIDE Result Date: 05/13/2024 INDICATION: Patient with a history of atrial fibrillation and heart failure with recurrent pleural effusion. Interventional Radiology asked to perform a diagnostic and therapeutic thoracentesis. EXAM: ULTRASOUND GUIDED THORACENTESIS MEDICATIONS: 1% lidocaine  10 mL COMPLICATIONS: None immediate. PROCEDURE: An ultrasound guided thoracentesis was thoroughly discussed with the patient and questions answered. The benefits, risks, alternatives and complications were also discussed. The patient understands and wishes to proceed with the procedure. Written consent was obtained. Ultrasound was performed to localize and mark an adequate pocket of fluid in the right chest. The area was then prepped and draped in the normal sterile fashion. 1% Lidocaine  was used for local anesthesia. Under ultrasound guidance a 6 Fr Safe-T-Centesis catheter was introduced. Thoracentesis was performed. The catheter was removed and a dressing applied. FINDINGS: A total of approximately 800 mL of clear yellow fluid was removed. Samples were sent to the laboratory as requested by the clinical team. IMPRESSION: Successful ultrasound guided RIGHT thoracentesis yielding 800 mL of pleural fluid. Procedure performed by: Warren Dais, NP under direct supervision of Thom Hall, MD Electronically Signed   By: Thom Hall M.D.   On: 05/13/2024 15:44   CT CHEST W CONTRAST Result Date: 05/13/2024 EXAM: CT Chest With Intravenous Contrast. CLINICAL HISTORY: Pleural effusion, malignancy suspected. TECHNIQUE: Axial computed tomography images  of the chest with intravenous  contrast. Dose reduction technique was used including one or more of the following: automated exposure control, adjustment of mA and kV according to patient size, and/or iterative reconstruction. CONTRAST: With; 75 mL iohexol  (OMNIPAQUE ) 350 MG/ML injection. COMPARISON: Chest radiograph of earlier today. Most recent chest CT 08/16/2021. FINDINGS: LUNGS: Right lower lobe collapse / consolidative change. Multiple bilateral pulmonary nodules on the order of 8 mm and less are similar to on the prior and identified on series 6. example medial left lower lobe at 8 mm on image 76. No central pulmonary embolism on this non-dedicated exam. PLEURAL SPACES: Small right pleural effusion, increased since the prior CT. No pleural thickening or nodularity. No pneumothorax. HEART AND MEDIASTINUM: Tracheal deviation right secondary to left-sided thyroid  enlargement. Moderate cardiomegaly. Right atrial and ventricular pacer leads. Aortic atherosclerosis. Marked enlargement of the left lobe of the thyroid  with multiple underlying hypoattenuating nodules. This was evaluated on 11/16/2015 ultrasound. Mild right hilar adenopathy at 1.4 cm is similar to on the prior. No supraclavicular adenopathy. No significant pericardial effusion. CHEST WALL AND UPPER ABDOMEN: Mild motion and patient arm positioned degradation. Artifact degradation continuing into the upper abdomen due to EKG wires and leads. Mild left adrenal thickening. The remaining visualized upper abdominal solid organs are unremarkable. BONES: Accentuation of expected thoracic kyphosis. No acute osseous abnormality. IMPRESSION: 1. Increase in small right pleural effusion. No findings of pleural nodularity to suggest malignant effusion. 2. Right lower lobe collapse/consolidative change which is primarily felt to represent atelectasis. Cannot exclude concurrent infection. 3. Motion and artifact degradation as detailed above. 4. Bilateral pulmonary nodules of maximally 8 mm have  been present back to at least 08/16/21 and can be considered benign. 5. Mild right hilar adenopathy, similar to 20123 and likely benign/reactive. Electronically signed by: Rockey Kilts MD 05/13/2024 01:29 PM EST RP Workstation: [id number]   DG Chest 1 View Result Date: 05/13/2024 EXAM: XR Chest, 1 View(s). CLINICAL HISTORY: Right pleural effusion status post thoracentesis. COMPARISON: 05/12/2024. FINDINGS: LUNGS: Improved aeration at right lung base. No consolidation. PLEURAL SPACES: Moderate sinus and reduced right pleural effusion. No pneumothorax. HEART: The heart size is normal. Aortic atherosclerosis. BONES: No acute osseous abnormality. LINES AND TUBES: Stable left subclavian transvenous pacemaker in place. IMPRESSION: 1. Reduced right pleural effusion with improved aeration at the right lung base, status post thoracentesis. 2. Aortic atherosclerosis. 3. Stable left subclavian transvenous pacemaker in place. Electronically signed by: Ryan Salvage MD 05/13/2024 10:03 AM EST RP Workstation: [id number]   DG Chest 2 View Result Date: 05/12/2024 CLINICAL DATA:  Urinary retention, abdominal pain, short of breath EXAM: CHEST - 2 VIEW COMPARISON:  05/03/2024 FINDINGS: Frontal and lateral views of the chest demonstrate stable multi lead pacemaker. The cardiac silhouette is unremarkable. There is increasing opacity at the right lung base, with worsening consolidation and right pleural effusion. No pneumothorax. Left chest is clear. IMPRESSION: 1. Progressive right basilar consolidation and enlarging right pleural effusion. Electronically Signed   By: Ozell Daring M.D.   On: 05/12/2024 17:29   CUP PACEART REMOTE DEVICE CHECK Result Date: 05/07/2024 PPM Scheduled remote reviewed. Normal device function.  Presenting rhythm:  Irregular VS Hx of AF, Eliquis  per EPIC Recent decrease in RV pacing with increased HR's per trends.  Both beginning to trend toward previous baseline Next remote transmission per  protocol. LA, CVRS  DG Swallowing Func-Speech Pathology Result Date: 05/04/2024 Table formatting from the original result was not included. Modified Barium Swallow Study Patient Details Name:  Hailey [name] MRN: [id number] Date of Birth: [birth year] Today's Date: 05/04/2024 HPI/PMH: HPI: Patient is a 78 y.o. female who presented to [facility] on 04/30/24 from home with SOB. She was admitted with progressive dyspnea and pleural effusion, suspected to be acute on chronic diastolic CHF exacerbation with pulmonary hypertension. Cardiology consulted. CXR on 11/28 showed new large right pleural effusion, with right lung compressive atelectasis but left lung clear. She underwent right thoracentesis 11/28 and 12/1 yielding 1.1 liters of pleural fluid and repeat CXR showed decreased right pleural effusion.12/1 RHC. SLP swallow evaluation ordered on 11/29 due to patient's c/o difficulty swallowing pills and some solid foods. MBS ordered to rule out aspiration. PMH: CAD, a-fib, HFpEF, HTN, DM-2, hyperthyroidism s/p right hemithyroidectomy 09/20/2022, 2023 barium esophagram indicating Mild esophageal deviation to the right and mild circumferential narrowing of the esophageal lumen in the lower neck by the patient's multinodular goiter, OSA, hearing loss. Clinical Impression: Clinical Impression: Patient presents with an oropharyngeal swallow that is within functional limits per this MBS. No aspiration or penetration was observed across any of the tested consistencies. SLP recommending patient continue regular/thin diet as tolerated. No SLP follow up is needed. SLP signing off at this time. DIGEST Swallow Severity Rating*               Safety: 0               Efficiency: 0               Overall Pharyngeal Swallow Severity: 0 1: mild; 2: moderate; 3: severe; 4: profound *The Dynamic Imaging Grade of Swallowing Toxicity is standardized for the head and neck cancer population, however, demonstrates promising clinical applications across  populations to standardize the clinical rating of pharyngeal swallow safety and severity. Recommendations/Plan: Swallowing Evaluation Recommendations Swallowing Evaluation Recommendations Recommendations: PO diet PO Diet Recommendation: Regular; Thin liquids (Level 0) Liquid Administration via: Spoon; Cup; Straw Medication Administration: Other (Comment) (As tolerated) Supervision: Patient able to self-feed Swallowing strategies  : Slow rate; Small bites/sips Postural changes: Position pt fully upright for meals; Stay upright 30-60 min after meals Oral care recommendations: Oral care BID (2x/day) Treatment Plan Treatment Plan Treatment recommendations: No treatment recommended at this time Follow-up recommendations: No SLP follow up Recommendations Recommendations for follow up therapy are one component of a multi-disciplinary discharge planning process, led by the attending physician.  Recommendations may be updated based on patient status, additional functional criteria and insurance authorization. Assessment: Orofacial Exam: Orofacial Exam Oral Cavity: Oral Hygiene: WFL Oral Cavity - Dentition: Adequate natural dentition Orofacial Anatomy: WFL Oral Motor/Sensory Function: WFL Anatomy: Anatomy: WFL Boluses Administered: Boluses Administered Boluses Administered: Thin liquids (Level 0); Mildly thick liquids (Level 2, nectar thick); Moderately thick liquids (Level 3, honey thick); Puree; Solid  Oral Impairment Domain: Oral Impairment Domain Lip Closure: No labial escape Tongue control during bolus hold: Not tested Bolus preparation/mastication: Timely and efficient chewing and mashing Bolus transport/lingual motion: Brisk tongue motion Oral residue: Complete oral clearance Location of oral residue : N/A Initiation of pharyngeal swallow : Posterior angle of the ramus  Pharyngeal Impairment Domain: Pharyngeal Impairment Domain Soft palate elevation: No bolus between soft palate (SP)/pharyngeal wall (PW) Laryngeal  elevation: Complete superior movement of thyroid  cartilage with complete approximation of arytenoids to epiglottic petiole Anterior hyoid excursion: Complete anterior movement Epiglottic movement: Complete inversion Laryngeal vestibule closure: Complete, no air/contrast in laryngeal vestibule Pharyngeal stripping wave : Present - complete Pharyngeal contraction (A/P view only): N/A Pharyngoesophageal segment opening: Complete  distension and complete duration, no obstruction of flow Tongue base retraction: No contrast between tongue base and posterior pharyngeal wall (PPW) Pharyngeal residue: Complete pharyngeal clearance Location of pharyngeal residue: N/A  Esophageal Impairment Domain: Esophageal Impairment Domain Esophageal clearance upright position: Complete clearance, esophageal coating Pill: No data recorded Penetration/Aspiration Scale Score: Penetration/Aspiration Scale Score 1.  Material does not enter airway: Thin liquids (Level 0); Mildly thick liquids (Level 2, nectar thick); Moderately thick liquids (Level 3, honey thick); Puree; Solid Compensatory Strategies: Compensatory Strategies Compensatory strategies: No   General Information: Caregiver present: No  Diet Prior to this Study: Regular; Thin liquids (Level 0)   Temperature : Normal   Respiratory Status: WFL   Supplemental O2: None (Room air)   History of Recent Intubation: No  Behavior/Cognition: Alert; Cooperative; Pleasant mood Self-Feeding Abilities: Able to self-feed Baseline vocal quality/speech: Normal No data recorded Volitional Swallow: Able to elicit Exam Limitations: No limitations Goal Planning: No data recorded No data recorded No data recorded No data recorded No data recorded Pain: Pain Assessment Pain Assessment: Faces Pain Score: 3 Faces Pain Scale: 2 Pain Location: back Pain Descriptors / Indicators: Aching Pain Intervention(s): Limited activity within patient's tolerance; Premedicated before session; Repositioned; Monitored during  session End of Session: Start Time:SLP Start Time (ACUTE ONLY): 1049 Stop Time: SLP Stop Time (ACUTE ONLY): 1103 Time Calculation:SLP Time Calculation (min) (ACUTE ONLY): 14 min Charges: SLP Evaluations $ SLP Speech Visit: 1 Visit SLP Evaluations $MBS Swallow: 1 Procedure SLP visit diagnosis: SLP Visit Diagnosis: Dysphagia, unspecified (R13.10) Past Medical History: Past Medical History: Diagnosis Date  Acute on chronic diastolic CHF (congestive heart failure), NYHA class 1 (HCC) 04/29/2013  Allergic rhinitis   Brady-tachy syndrome (HCC)   s/p STJ dual chamber PPM by Dr Francyne  CAD (coronary artery disease)   70% RCA stenosis, treated medically  Control of atrial fibrillation with pacemaker (HCC)   Diabetes mellitus (HCC)   type 2  DJD (degenerative joint disease)   Dyspnea   occasional  Hearing loss   Right ear only - no hearing aids  Hypertension   Hyperthyroidism   Paroxysmal atrial fibrillation (HCC)   Pacemaker St Jude  Pneumonia   years ago- before 2012 per patient  [place]   as a child  PONV (postoperative nausea and vomiting)   Presence of permanent cardiac pacemaker   St Jude pacemaker  Pulmonary disease   Sleep apnea   uses  CPAP nightly Past Surgical History: Past Surgical History: Procedure Laterality Date  arm surgery d/t fx Right 1996  ATRIAL FIBRILLATION ABLATION  07/31/2012; 08-10-13  PVI by Dr Kelsie  ATRIAL FIBRILLATION ABLATION N/A 07/31/2012  Procedure: ATRIAL FIBRILLATION ABLATION;  Surgeon: Lynwood Kelsie, MD;  Location: [facility] CATH LAB;  Service: Cardiovascular;  Laterality: N/A;  ATRIAL FIBRILLATION ABLATION N/A 08/10/2013  Procedure: ATRIAL FIBRILLATION ABLATION;  Surgeon: Lynwood JONETTA Kelsie, MD;  Location: [facility] CATH LAB;  Service: Cardiovascular;  Laterality: N/A;  BACK SURGERY  07/26/2012  dR. nUDELMAN  BIOPSY  06/30/2023  Procedure: BIOPSY;  Surgeon: Cindie Carlin POUR, DO;  Location: [facility] ENDO SUITE;  Service: Endoscopy;;  CARDIAC CATHETERIZATION  12/03/2010  single vessel mid RCA  CARDIAC  ELECTROPHYSIOLOGY MAPPING AND ABLATION  07/31/2012  Dr. Lynwood Kelsie  CESAREAN SECTION  09/20/1974  Dr. Lonni  CHOLECYSTECTOMY  1994  COLONOSCOPY WITH PROPOFOL  N/A 03/21/2015  Procedure: COLONOSCOPY WITH PROPOFOL ;  Surgeon: Renaye Sous, MD;  Location: [facility] ENDOSCOPY;  Service: Endoscopy;  Laterality: N/A;  COLONOSCOPY WITH PROPOFOL  N/A 06/30/2023  Procedure:  COLONOSCOPY WITH PROPOFOL ;  Surgeon: Cindie Carlin POUR, DO;  Location: [facility] ENDO SU[facility];  Service: Endoscopy;  Laterality: N/A;  11:15 AM, ASA 4  DIAGNOSTIC MAMMOGRAM  04/16/2017  Duwaine HERO. Morris, DO  ESOPHAGOGASTRODUODENOSCOPY (EGD) WITH PROPOFOL  N/A 06/30/2023  Procedure: ESOPHAGOGASTRODUODENOSCOPY (EGD) WITH PROPOFOL ;  Surgeon: Cindie Carlin POUR, DO;  Location: [facility];  Service: Endoscopy;  Laterality: N/A;  11:15 AM, ASA 4  EYE SURGERY Bilateral   remove cataracts  IR THORACENTESIS ASP PLEURAL SPACE W/IMG GUIDE  04/30/2024  IR THORACENTESIS ASP PLEURAL SPACE W/IMG GUIDE  05/03/2024  LAPAROSCOPIC HYSTERECTOMY  Late 70s to early 80s  LEFT HEART CATH AND CORONARY ANGIOGRAPHY N/A 07/30/2017  Procedure: LEFT HEART CATH AND CORONARY ANGIOGRAPHY;  Surgeon: Burnard Debby LABOR, MD;  Location: [facility] INVASIVE CV LAB;  Service: Cardiovascular;  Laterality: N/A;  LUMBAR LAMINECTOMY/DECOMPRESSION MICRODISCECTOMY Right 12/23/2012  Procedure: Right L5-S1 Laminotomy for resection of synovial cyst;  Surgeon: Lamar LELON Peaches, MD;  Location: [facility] NEURO ORS;  Service: Neurosurgery;  Laterality: Right;  Right Lumbar five-sacral one Laminotomy for resection of synovial cyst  NM MYOCAR PERF WALL MOTION  12/20/2010  normal  PACEMAKER IMPLANT N/A 08/09/2019  Procedure: PACEMAKER IMPLANT;  Surgeon: Francyne Headland, MD;  Location: [facility] INVASIVE CV LAB;  Service: Cardiovascular;  Laterality: N/A;  PACEMAKER INSERTION  12/03/2010  SJM Accent DR RF implanted by Dr Francyne  POLYPECTOMY  06/30/2023  Procedure: POLYPECTOMY;  Surgeon: Cindie Carlin POUR, DO;  Location: [facility];  Service:  Endoscopy;;  RIGHT HEART CATH N/A 04/22/2018  Procedure: RIGHT HEART CATH;  Surgeon: Darron Deatrice LABOR, MD;  Location: [facility] INVASIVE CV LAB;  Service: Cardiovascular;  Laterality: N/A;  RIGHT HEART CATH N/A 07/09/2022  Procedure: RIGHT HEART CATH;  Surgeon: Cherrie Toribio SAUNDERS, MD;  Location: [facility] INVASIVE CV LAB;  Service: Cardiovascular;  Laterality: N/A;  RIGHT HEART CATH N/A 05/03/2024  Procedure: RIGHT HEART CATH;  Surgeon: Cherrie Toribio SAUNDERS, MD;  Location: [facility] INVASIVE CV LAB;  Service: Cardiovascular;  Laterality: N/A;  TEE WITHOUT CARDIOVERSION N/A 07/30/2012  Procedure: TRANSESOPHAGEAL ECHOCARDIOGRAM (TEE);  Surgeon: Headland Francyne, MD;  Location: [facility] ENDOSCOPY;  Service: Cardiovascular;  Laterality: N/A;  h&p in file-hope  TEE WITHOUT CARDIOVERSION N/A 08/09/2013  Procedure: TRANSESOPHAGEAL ECHOCARDIOGRAM (TEE);  Surgeon: Redell GORMAN Shallow, MD;  Location: [facility] ENDOSCOPY;  Service: Cardiovascular;  Laterality: N/A;  THYROID  LOBECTOMY Right 09/20/2022  Procedure: RIGHT HEMITHYROIDECTOMY;  Surgeon: Llewellyn Gerard LABOR, DO;  Location: [facility] OR;  Service: ENT;  Laterality: Right;  TOTAL ABDOMINAL HYSTERECTOMY  1981  ULTRASOUND GUIDANCE FOR VASCULAR ACCESS  04/22/2018  Procedure: Ultrasound Guidance For Vascular Access;  Surgeon: Darron Deatrice LABOR, MD;  Location: [facility] INVASIVE CV LAB;  Service: Cardiovascular;; Norleen IVAR Blase, MA, CCC-SLP Speech Therapy 05/04/2024, 3:12 PM  DG Chest 1 View Result Date: 05/03/2024 CLINICAL DATA:  Status post right thoracentesis. EXAM: CHEST  1 VIEW COMPARISON:  05/02/2024 FINDINGS: Grossly stable enlarged cardiac silhouette. Stable left subclavian pacer leads. Moderate-sized right pleural effusion, decreased in size. Decreased associated right basilar atelectasis. No pneumothorax seen. Minimal left lower lobe atelectasis. Diffuse osteopenia. IMPRESSION: 1. No pneumothorax following right thoracentesis. 2. Moderate-sized right pleural effusion, decreased in size. 3. Decreased right basilar  atelectasis. 4. Minimal left lower lobe atelectasis. Electronically Signed   By: Elspeth Bathe M.D.   On: 05/03/2024 15:00   CARDIAC CATHETERIZATION Result Date: 05/03/2024 Findings: RA = 3 RV = 46/4 PA = 44/22 (32) PCW = 11 Fick cardiac output/index = 5.3/3.8 PVR = 4.0 WU  Ao sat = 98% PA sat = 76% SVC sat = 75% PAPi = 7.3 Assessment: 1. Mild PAH with normal left-sided filling pressures and cardiac output 2. PA pressures and PCWP much improved from previous cath 3. No change in PVR with addition of low-dose PDE-5i Plan/Discussion: Will increase sildenafil  to 40 tid and f/u as outpatient to discuss trial of macitentan vs sotatercept. Please arrange f/u with me in [facility] to discuss in 1-2 months. D/w Dr. Croituro Daniel Bensimhon, MD 12:05 PM  IR THORACENTESIS ASP PLEURAL SPACE W/IMG GUIDE Result Date: 05/03/2024 INDICATION: 78 year old female with history of heart failure and recurrent right pleural effusion. Request for therapeutic and diagnostic thoracentesis. EXAM: ULTRASOUND GUIDED right THORACENTESIS MEDICATIONS: 10 mL 1% lidocaine  COMPLICATIONS: None immediate. PROCEDURE: An ultrasound guided thoracentesis was thoroughly discussed with the patient and questions answered. The benefits, risks, alternatives and complications were also discussed. The patient understands and wishes to proceed with the procedure. Written consent was obtained. Ultrasound was performed to localize and mark an adequate pocket of fluid in the right chest. The area was then prepped and draped in the normal sterile fashion. 1% Lidocaine  was used for local anesthesia. Under ultrasound guidance a 6 Fr Safe-T-Centesis catheter was introduced. Thoracentesis was performed. The catheter was removed and a dressing applied. FINDINGS: A total of approximately 900 mL of hazy yellow fluid was removed. Samples were sent to the laboratory as requested by the clinical team. Post thoracentesis chest x-ray reviewed with Dr. Jennefer, no  pneumothorax. IMPRESSION: Successful ultrasound guided right thoracentesis yielding 900 mL of pleural fluid. Performed by: Aimee Han, PA-C. Electronically Signed   By: Ester Jennefer M.D.   On: 05/03/2024 11:40   DG CHEST PORT 1 VIEW Result Date: 05/02/2024 CLINICAL DATA:  Chest pain. EXAM: PORTABLE CHEST 1 VIEW COMPARISON:  04/30/2024 FINDINGS: Left-sided pacemaker unchanged. Lungs are hypoinflated with persistent opacification over the right mid to lower lung likely moderate size effusion with associated basilar atelectasis. Slight interval improvement of this right-sided effusion. Left lung is clear. Infection over the right mid to lower lung is possible. Cardiomediastinal silhouette and remainder the exam is unchanged. IMPRESSION: Slight interval improvement opacification over the right mid to lower lung likely moderate size effusion with associated basilar atelectasis. Infection over the right mid to lower lung is possible. Electronically Signed   By: Toribio Agreste M.D.   On: 05/02/2024 12:49   CT ABDOMEN PELVIS W CONTRAST Result Date: 05/02/2024 CLINICAL DATA:  Abdominal pain. EXAM: CT ABDOMEN AND PELVIS WITH CONTRAST TECHNIQUE: Multidetector CT imaging of the abdomen and pelvis was performed using the standard protocol following bolus administration of intravenous contrast. RADIATION DOSE REDUCTION: This exam was performed according to the departmental dose-optimization program which includes automated exposure control, adjustment of the mA and/or kV according to patient size and/or use of iterative reconstruction technique. CONTRAST:  75mL OMNIPAQUE  IOHEXOL  350 MG/ML SOLN COMPARISON:  06/19/2023. FINDINGS: Lower chest: Moderate to large right pleural effusion with right lower lobe atelectasis partial atelectasis of the dependent right middle lobe. There is some partly obscured nodularity with right middle lobe atelectasis. There are small nodulesa at the left lung base measuring up to 6 mm.  Hepatobiliary: No focal liver abnormality is seen. Status post cholecystectomy. No biliary dilatation. Pancreas: Unremarkable. No pancreatic ductal dilatation or surrounding inflammatory changes. Spleen: Normal in size without focal abnormality. Adrenals/Urinary Tract: No adrenal mass. Right kidney displaced inferiorly similar to the prior exam. Nonobstructing stone in the lower pole the right kidney, stable.  [place] stone on the left appears vascular. No renal mass. No hydronephrosis. Normal ureters. Foley catheter lies within the bladder with associated non dependent air. Stomach/Bowel: Stomach decompressed, otherwise unremarkable. Small bowel and colon are normal in caliber. No wall thickening or inflammation. Normal appendix visualized. Vascular/Lymphatic: Aortic atherosclerosis. No aneurysm. No enlarged lymph nodes. Reproductive: Status post hysterectomy. No adnexal masses. Other: No abdominal wall hernia or abnormality. No abdominopelvic ascites. Musculoskeletal: No fracture or acute finding.  No bone lesion. IMPRESSION: 1. No acute findings within the abdomen or pelvis. No bowel obstruction or inflammation. 2. Moderate to large right pleural effusion with associated atelectasis. 3. Lung base nodules, 1 of which was present on the CT from 06/19/2023. Nodules measure up to 6 mm. Non-contrast chest CT at 3-6 months is recommended. If the nodules are stable at time of repeat CT, then future CT at 18-24 months (from today's scan) is considered optional for low-risk patients, but is recommended for high-risk patients. This recommendation follows the consensus statement: Guidelines for Management of Incidental Pulmonary Nodules Detected on CT Images: From the Fleischner Society 2017; Radiology 2017; 284:228-243. 4. Aortic atherosclerosis. Electronically Signed   By: Alm Parkins M.D.   On: 05/02/2024 12:09   DG Abd 1 View Result Date: 04/30/2024 CLINICAL DATA:  Vomiting and constipation EXAM: ABDOMEN - 1 VIEW  COMPARISON:  None Available. FINDINGS: The bowel gas pattern is normal. There surgical clips in the right abdomen. No radio-opaque calculi. There are degenerative changes of the hips and spine. IMPRESSION: Nonobstructive bowel gas pattern. Electronically Signed   By: Greig Pique M.D.   On: 04/30/2024 20:56   DG Chest 1 View Result Date: 04/30/2024 CLINICAL DATA:  Status post thoracentesis. EXAM: CHEST  1 VIEW COMPARISON:  04/30/2024 FINDINGS: Right pleural effusion has decreased in size. Persistent blunting at the right costophrenic angle could represent residual pleural fluid, atelectasis or consolidation. Negative for pneumothorax. Left lung remains clear. Left chest pacemaker appears stable. Heart size within normal limits. IMPRESSION: 1. Decreased right pleural effusion following thoracentesis. Negative for pneumothorax. Electronically Signed   By: Juliene Balder M.D.   On: 04/30/2024 16:21   IR THORACENTESIS ASP PLEURAL SPACE W/IMG GUIDE Result Date: 04/30/2024 INDICATION: History of chronic CHF. Currently admitted with new found right pleural effusion. Request for diagnostic and therapeutic right thoracentesis. EXAM: ULTRASOUND GUIDED DIAGNOSTIC AND THERAPEUTIC RIGHT THORACENTESIS MEDICATIONS: 8 mL 1% lidocaine  COMPLICATIONS: None immediate. PROCEDURE: An ultrasound guided thoracentesis was thoroughly discussed with the patient and questions answered. The benefits, risks, alternatives and complications were also discussed. The patient understands and wishes to proceed with the procedure. Written consent was obtained. Ultrasound was performed to localize and mark an adequate pocket of fluid in the right chest. The area was then prepped and draped in the normal sterile fashion. 1% Lidocaine  was used for local anesthesia. Under ultrasound guidance a 6 Fr Safe-T-Centesis catheter was introduced. Thoracentesis was performed. The catheter was removed and a dressing applied. FINDINGS: A total of approximately  1.1 liters of slightly hazy yellow fluid was removed. Samples were sent to the laboratory as requested by the clinical team. IMPRESSION: Successful ultrasound guided right thoracentesis yielding 1.1 liters of pleural fluid. Performed and dictated by Kimble Clas, PA-C Electronically Signed   By: Cordella Banner   On: 04/30/2024 16:19   DG Chest 2 View Result Date: 04/30/2024 CLINICAL DATA:  Shortness of breath. Atrial fibrillation. Congestive heart failure. EXAM: CHEST - 2 VIEW COMPARISON:  11/07/2023 FINDINGS: New large right pleural effusion  is seen, with right lung compressive atelectasis. Left lung remains clear. Heart size is stable. Pacemaker again noted. Stable mediastinal mass causing tracheal deviation to the right, consistent with known goiter as demonstrated on prior neck CT. IMPRESSION: New large right pleural effusion, with right lung compressive atelectasis. Stable mediastinal mass, consistent with known goiter. Electronically Signed   By: Norleen DELENA Kil M.D.   On: 04/30/2024 11:45   ECHOCARDIOGRAM COMPLETE Result Date: 04/28/2024    ECHOCARDIOGRAM REPORT   Patient Name:   [name] YOSHIMURA Date of Exam: 04/28/2024 Medical Rec #:  [id number]      Height:       59.0 in Accession #:    [id number]     Weight:       103.8 lb Date of Birth:  1945/07/07      BSA:          1.396 m Patient Age:    78 years       BP:           161/83 mmHg Patient Gender: F              HR:           54 bpm. Exam Location:  Outpatient Procedure: 2D Echo, Cardiac Doppler and Color Doppler (Both Spectral and Color            Flow Doppler were utilized during procedure). Indications:    I27.20 Pulmonary Hypertension  History:        Patient has prior history of Echocardiogram examinations, most                 recent 07/01/2022. CHF, CAD, Pacemaker, Arrythmias:Atrial                 Fibrillation; Risk Factors:Hypertension, Sleep Apnea and                 Diabetes.  Sonographer:    Damien Senior RDCS Referring Phys: [phone] MIHAI  CROITORU IMPRESSIONS  1. Left ventricular ejection fraction, by estimation, is 60 to 65%. The left ventricle has normal function. The left ventricle has no regional wall motion abnormalities. Left ventricular diastolic parameters were normal.  2. Right ventricular systolic function is normal. The right ventricular size is severely enlarged. There is severely elevated pulmonary artery systolic pressure. The estimated right ventricular systolic pressure is 81.6 mmHg.  3. Left atrial size was mildly dilated.  4. The mitral valve is degenerative. Mild mitral valve regurgitation.  5. The tricuspid valve is abnormal. Tricuspid valve regurgitation is severe.  6. The aortic valve is tricuspid. Aortic valve regurgitation is mild. No aortic stenosis is present.  7. The inferior vena cava is normal in size with <50% respiratory variability, suggesting right atrial pressure of 8 mmHg. Comparison(s): Changes from prior study are noted. No severely elevated pulmonary pressures and severely dilated RV. Conclusion(s)/Recommendation(s): Normal BiV systolic function. Severe TR and severely elevated pulmonary pressures. The RV is severely enlarged. FINDINGS  Left Ventricle: Left ventricular ejection fraction, by estimation, is 60 to 65%. The left ventricle has normal function. The left ventricle has no regional wall motion abnormalities. The left ventricular internal cavity size was normal in size. There is  no left ventricular hypertrophy. Left ventricular diastolic parameters were normal. Right Ventricle: The right ventricular size is severely enlarged. No increase in right ventricular wall thickness. Right ventricular systolic function is normal. There is severely elevated pulmonary artery systolic pressure. The tricuspid regurgitant velocity is 4.29 m/s, and with  an assumed right atrial pressure of 8 mmHg, the estimated right ventricular systolic pressure is 81.6 mmHg. Left Atrium: Left atrial size was mildly dilated. Right  Atrium: Right atrial size was normal in size. Pericardium: There is no evidence of pericardial effusion. Mitral Valve: The mitral valve is degenerative in appearance. There is mild thickening of the mitral valve leaflet(s). Mild mitral valve regurgitation. Tricuspid Valve: The tricuspid valve is abnormal. Tricuspid valve regurgitation is severe. No evidence of tricuspid stenosis. The flow in the hepatic veins is reversed during ventricular systole. Aortic Valve: The aortic valve is tricuspid. Aortic valve regurgitation is mild. No aortic stenosis is present. Pulmonic Valve: The pulmonic valve was grossly normal. Pulmonic valve regurgitation is not visualized. No evidence of pulmonic stenosis. Aorta: The aortic root and ascending aorta are structurally normal, with no evidence of dilitation. Venous: The inferior vena cava is normal in size with less than 50% respiratory variability, suggesting right atrial pressure of 8 mmHg. IAS/Shunts: No atrial level shunt detected by color flow Doppler. Additional Comments: A device lead is visualized in the right atrium and right ventricle.  LEFT VENTRICLE PLAX 2D LVIDd:         4.20 cm   Diastology LVIDs:         2.50 cm   LV e' medial:    7.72 cm/s LV PW:         0.80 cm   LV E/e' medial:  11.3 LV IVS:        0.70 cm   LV e' lateral:   9.03 cm/s LVOT diam:     1.73 cm   LV E/e' lateral: 9.7 LV SV:         35 LV SV Index:   25 LVOT Area:     2.35 cm  RIGHT VENTRICLE RV S prime:     11.20 cm/s  PULMONARY VEINS TAPSE (M-mode): 1.4 cm      Diastolic Velocity: 51.90 cm/s                             S/D Velocity:       0.50                             Systolic Velocity:  28.30 cm/s LEFT ATRIUM             Index        RIGHT ATRIUM           Index LA diam:        3.58 cm 2.57 cm/m   RA Area:     13.20 cm LA Vol (A2C):   51.3 ml 36.76 ml/m  RA Volume:   30.20 ml  21.64 ml/m LA Vol (A4C):   55.6 ml 39.84 ml/m LA Biplane Vol: 58.6 ml 41.99 ml/m  AORTIC VALVE LVOT Vmax:   71.85  cm/s LVOT Vmean:  48.600 cm/s LVOT VTI:    0.149 m  AORTA Ao Root diam: 2.54 cm Ao Asc diam:  2.56 cm MITRAL VALVE               TRICUSPID VALVE MV Area (PHT): 4.86 cm    TR Peak grad:   73.6 mmHg MV Decel Time: 156 msec    TR Vmax:        429.00 cm/s MV E velocity: 87.50 cm/s MV A velocity: 41.40 cm/s  SHUNTS MV E/A  ratio:  2.11        Systemic VTI:  0.15 m                            Systemic Diam: 1.73 cm Georganna Archer Electronically signed by Georganna Archer Signature Date/Time: 04/28/2024/4:13:48 PM    Final     Microbiology: No results found for this or any previous visit (from the past 240 hours).   Labs: Basic Metabolic Panel: Recent Labs  Lab 05/18/24 0506 05/19/24 0839 05/20/24 0437 05/23/24 0310 05/24/24 0842  NA 129* 130* 127* 126* 127*  K 2.6* 4.9 3.9 3.8 3.6  CL 91* 91* 86* 84* 85*  CO2 32 33* 32 37* 29  GLUCOSE 207* 108* 83 122* 113*  BUN <5* 7* 9 12 11   CREATININE 0.64 0.60 0.58 0.85 0.70  CALCIUM  7.4* 8.8* 8.5* 8.7* 8.7*  MG 2.2  --   --   --   --   PHOS 2.7  --   --   --   --    Liver Function Tests: Recent Labs  Lab 05/23/24 0310  AST 15  ALT 9  ALKPHOS 104  BILITOT 0.5  PROT 5.2*  ALBUMIN  2.7*   No results for input(s): LIPASE, AMYLASE in the last 168 hours. No results for input(s): AMMONIA in the last 168 hours. CBC: Recent Labs  Lab 05/18/24 0506 05/20/24 0437 05/23/24 0310  WBC 10.5 7.8 7.1  HGB 11.2* 10.6* 10.1*  HCT 35.1* 33.7* 31.8*  MCV 82.6 82.2 81.3  PLT 420* 359 328   Cardiac Enzymes: No results for input(s): CKTOTAL, CKMB, CKMBINDEX, TROPONINI in the last 168 hours. BNP: BNP (last 3 results) Recent Labs    04/30/24 1155  BNP 151.2*    ProBNP (last 3 results) Recent Labs    11/13/23 0950 12/04/23 1428 04/28/24 1548  PROBNP 3,383* 4,891* 2,352*    CBG: Recent Labs  Lab 05/21/24 0058 05/24/24 1113  GLUCAP 145* 150*    Signed:  Colen Grimes MD   [facility] 05/24/2024, 12:47  PM      [1]  Allergies Allergen Reactions   Codeine Nausea And Vomiting   Ketorolac  Nausea And Vomiting and Other (See Comments)    pt told to never take this med   Meperidine  Nausea Only and Nausea And Vomiting   Multaq  [Dronedarone ] Other (See Comments)    Increased fluid retention   Xarelto [Rivaroxaban] Diarrhea   Flexeril  [Cyclobenzaprine ] Nausea And Vomiting   Latex Rash    Pt stated on 09/20/2022

## 2024-05-24 NOTE — H&P (Signed)
 [facility] [place]  History & Physical Date: 05/24/2024 Patient Name / DOB: Hailey [name]  06-02-1946   Room: T843/T843-98               Patient Care Team: Sherlynn Madden, MD as PCP - General (Internal Medicine)  Assessment & Plan  Profound weakness secondary to large pleural effusion requiring thoracentesis x 2 in addition to pleurodesis through talck administration complicated with metabolic encephalopathy as well as urinary retention requiring Foley catheter placement Hyponatremia and hypokalemia  Secondary diagnosis Pulmonary hypertension Thyroid  goiter Bradycardia bradycardia syndrome Atrial fibrillation GERD Hypothyroidism Hypertension Hyperlipidemia Type 2 diabetes mellitus with nephropathy and neuropathy Coronary artery disease  Dysphagia Constipation Chronic diastolic heart failure      Principal Problem:   Pleural effusion Active Problems:   Pulmonary arterial hypertension (CMS-HCC)   Thyroid  goiter   Metabolic encephalopathy   Weakness generalized   Urinary retention   Urinary catheter in place   Hyponatremia   Hypokalemia   Tachycardia-bradycardia syndrome (CMS-HCC)   Permanent atrial fibrillation    (CMS-HCC)   Hyperthyroidism   Gastroesophageal reflux disease   Cardiac pacemaker in situ   Essential hypertension, benign   Hyperlipidemia   Type 2 diabetes mellitus without complications (CMS-HCC)   Coronary atherosclerosis of native coronary artery   Constipation   Dysphagia   Chronic diastolic heart failure (CMS-HCC)     Level of Care: skilled  DVT prophylaxis:  on other anticoagulant (apixaban , rivaroxaban, dabigatran , prasugrel) Anticipated disposition: To Home with Home Health Estimated discharge /2 weeks   Chief Complaint  Shortness of breath.  Problem on ambulation.  Feeling weak.  The problem resting and sleeping.  Chest wall discomfort.  Poor appetite.  History Of Present  Illness   Hailey [name] is a  78 y.o.  female with known history of multiple medical problems as mentioned above was admitted with recently in the hospital for large pleural effusion requiring thoracentesis in addition to pleurodesis talc  placement.  However despite the above-mentioned plans the patient continued to remain weak and in need for further physical therapy to improve her muscle strength for which reason she was admitted at our skilled nursing facility to undergo further PT and and OT in addition to close monitoring on her medication and close follow-up of her chest x-ray.  To check for any large pleural effusion.  Review Of Systems  As mentioned above  Allergies  Codeine, Ketorolac , Meperidine , Dronedarone , Rivaroxaban, Cyclobenzaprine , and Latex  Home Medications   Prior to Admission medications  Medication Dose, Route, Frequency  acetaminophen  (TYLENOL ) 500 MG tablet 500-1,000 mg, Every 6 hours PRN  apixaban  (ELIQUIS ) 5 mg Tab 5 mg, 2 times a day (standard)  cholecalciferol , vitamin D3-25 mcg, 1,000 unit,, 25 mcg (1,000 unit) capsule 1,000 Units, Daily (standard)  furosemide  (LASIX ) 80 MG tablet 40 mg, 2 times a day (standard)  levothyroxine 25 mcg cap 1 tablet, Daily (standard)  loperamide  (IMODIUM  A-D) 2 mg tablet 2 mg, Daily PRN  metoPROLOL  tartrate (LOPRESSOR ) 25 MG tablet 12.5 mg, 2 times a day (standard)  ondansetron  (ZOFRAN ) 4 MG tablet 4 mg, Every 8 hours PRN  pantoprazole  (PROTONIX ) 40 MG tablet 40 mg, Daily before breakfast  potassium chloride  20 MEQ ER tablet 20 mEq, 2 times a day (standard)  sildenafiL , pulm.hypertension, (REVATIO ) 20 mg tablet 20 mg, 3 times a day (standard)  sod picosulf-mag ox-citric ac (CLENPIQ ) 10 mg-3.5 gram- 12 gram/175 mL Soln 1 kit  spironolactone  (ALDACTONE )  25 MG tablet 25 mg, Daily (standard)  tizanidine  (ZANAFLEX ) 2 MG capsule 2 mg, Oral, At bedtime  traMADol  (ULTRAM ) 50 mg tablet 50-100 mg, Every 6 hours PRN    Medical  History  Past Medical History[1] As mentioned above Surgical History  Goiter removal. Hysterectomy. Gallbladder surgery. Back surgery.  Social History  Patient is married'.  Denies smoking denies alcohol abuse. Have 3 children. Used to do public work.   Short Social History[2]  Family History  Family History[3]  Father died of old age. Mother have small bowel obstruction. Brother has CAD. Sister has a rectal cancer.  Code Status  DNR and DNI  Objective  Temp:  [36.2 C (97.1 F)] 36.2 C (97.1 F) Pulse:  [66] 66 Resp:  [18] 18 BP: (138)/(52) 138/52 SpO2:  [97 %] 97 % BMI (Calculated):  [17.89] 17.89  Physical Exam   General:  no acute distress    Neuro:   awake, alert, oriented CV:   regular rate and rhythm, normal S1, S2 Pulmonary:  decreased breath sounds on base(s) Abdomen:  soft, nontender, nondistended Extremities:  no edema MSK:   3/5 strength throughout HEENT:  normacephalic and atraumatic GU:   not assessed SKIN:   no petechiae  Lab Results  Labs Reviewed:  Yes Lab Results  Component Value Date   WBC 11.1 (H) 11/02/2023   HGB 10.7 (L) 11/02/2023   HCT 34.8 11/02/2023   PLT 273 11/02/2023    Lab Results  Component Value Date   NA 137 11/03/2023   K 3.5 11/03/2023   CL 98 11/03/2023   CO2 33.4 (H) 11/03/2023   BUN 22 (H) 11/03/2023   CREATININE 1.22 (H) 11/03/2023   GLU 109 11/03/2023   CALCIUM  9.5 11/03/2023   MG 1.8 11/03/2023      Pending Labs    Imaging   XR Chest Portable Result Date: 05/24/2024 CLINICAL DATA:  Chest tube removal. EXAM: PORTABLE CHEST 1 VIEW COMPARISON:  05/21/2024 FINDINGS: Right-sided pleural drain is been removed in the interval. No discernible pneumothorax. Right base atelectasis with trace effusion evident. Cardiopericardial silhouette is at upper limits of normal for size. Left-sided permanent pacemaker again noted. IMPRESSION: Interval removal of right-sided pleural drain without discernible  pneumothorax. Electronically Signed   By: Camellia Candle M.D.   On: 05/24/2024 07:24  Patient is a good rehab candidate She is to have combination of both physical therapy and Occupational Therapy in addition to close monitoring on her medication    Yancey DELENA Reno, MD             [1] Past Medical History: Diagnosis Date   Hyperthyroidism 05/24/2024  [2] Social History Tobacco Use   Smoking status: Never   Smokeless tobacco: Never  Vaping Use   Vaping status: Never Used  Substance Use Topics   Alcohol use: Not Currently   Drug use: Never  [3] No family history on file.

## 2024-05-24 NOTE — Plan of Care (Signed)
" °  Problem: Education: Goal: Knowledge of General Education information will improve Description: Including pain rating scale, medication(s)/side effects and non-pharmacologic comfort measures 05/24/2024 1249 by Delores Kirsch, RN Outcome: Adequate for Discharge 05/24/2024 1247 by Delores Kirsch, RN Outcome: Progressing   Problem: Health Behavior/Discharge Planning: Goal: Ability to manage health-related needs will improve 05/24/2024 1249 by Delores Kirsch, RN Outcome: Adequate for Discharge 05/24/2024 1247 by Delores Kirsch, RN Outcome: Progressing   Problem: Clinical Measurements: Goal: Ability to maintain clinical measurements within normal limits will improve 05/24/2024 1249 by Delores Kirsch, RN Outcome: Adequate for Discharge 05/24/2024 1247 by Delores Kirsch, RN Outcome: Progressing Goal: Will remain free from infection 05/24/2024 1249 by Delores Kirsch, RN Outcome: Adequate for Discharge 05/24/2024 1247 by Delores Kirsch, RN Outcome: Progressing Goal: Diagnostic test results will improve 05/24/2024 1249 by Delores Kirsch, RN Outcome: Adequate for Discharge 05/24/2024 1247 by Delores Kirsch, RN Outcome: Progressing Goal: Respiratory complications will improve Outcome: Adequate for Discharge Goal: Cardiovascular complication will be avoided Outcome: Adequate for Discharge   Problem: Activity: Goal: Risk for activity intolerance will decrease Outcome: Adequate for Discharge   Problem: Nutrition: Goal: Adequate nutrition will be maintained Outcome: Adequate for Discharge   Problem: Coping: Goal: Level of anxiety will decrease Outcome: Adequate for Discharge   Problem: Elimination: Goal: Will not experience complications related to bowel motility Outcome: Adequate for Discharge Goal: Will not experience complications related to urinary retention Outcome: Adequate for Discharge   Problem: Pain Managment: Goal: General experience of comfort will improve and/or  be controlled Outcome: Adequate for Discharge   Problem: Safety: Goal: Ability to remain free from injury will improve Outcome: Adequate for Discharge   Problem: Skin Integrity: Goal: Risk for impaired skin integrity will decrease Outcome: Adequate for Discharge   Problem: Education: Goal: Knowledge of General Education information will improve Description: Including pain rating scale, medication(s)/side effects and non-pharmacologic comfort measures Outcome: Adequate for Discharge   Problem: Health Behavior/Discharge Planning: Goal: Ability to manage health-related needs will improve Outcome: Adequate for Discharge   Problem: Clinical Measurements: Goal: Ability to maintain clinical measurements within normal limits will improve Outcome: Adequate for Discharge Goal: Will remain free from infection Outcome: Adequate for Discharge Goal: Diagnostic test results will improve Outcome: Adequate for Discharge Goal: Respiratory complications will improve Outcome: Adequate for Discharge Goal: Cardiovascular complication will be avoided Outcome: Adequate for Discharge   Problem: Activity: Goal: Risk for activity intolerance will decrease Outcome: Adequate for Discharge   Problem: Nutrition: Goal: Adequate nutrition will be maintained Outcome: Adequate for Discharge   Problem: Coping: Goal: Level of anxiety will decrease Outcome: Adequate for Discharge   Problem: Elimination: Goal: Will not experience complications related to bowel motility Outcome: Adequate for Discharge Goal: Will not experience complications related to urinary retention Outcome: Adequate for Discharge   Problem: Pain Managment: Goal: General experience of comfort will improve and/or be controlled Outcome: Adequate for Discharge   Problem: Safety: Goal: Ability to remain free from injury will improve Outcome: Adequate for Discharge   Problem: Skin Integrity: Goal: Risk for impaired skin integrity  will decrease Outcome: Adequate for Discharge   "

## 2024-05-24 NOTE — Plan of Care (Signed)

## 2024-05-24 NOTE — Progress Notes (Signed)
 Occupational Therapy Treatment Patient Details Name: Hailey [name] MRN: [id number] DOB: May 09, 1946 Today's Date: 05/24/2024   History of present illness 78 yo F adm 12/10 weakness urinay retention R pleural effusion 12/11 thoracentesis PMH hospitalized 11/28- 05/03/2024 bil pleural effusions, acute on chronic CHF, mixed pulmonary HTN, acute respiratory failure. 11/28 & 12/1 thoracentesis. 12/1 RHC. CAD, Afib, tachy-brady syndrome s/p PPM, HFpEF, HTN, DM2, hyperthyroidism, OSA.  Chest tube.   OT comments  Pt making progress with functional goals. Pt sitting EOB upon arrival with her son present and requesting assist to the bathroom. Pt Sup STS to RW a to walk to commode with Sup to transfer, CGA with clothing mgt and Sup with anterior hygiene. Pt stood from commode with Sup and walked to sink for hygiene tasks CGA. Pt returned to bed with Sup. OT to continue to follow acutely to maximize level of function and safety      If plan is discharge home, recommend the following:  A little help with walking and/or transfers;Assistance with cooking/housework;Assist for transportation;Help with stairs or ramp for entrance;A lot of help with bathing/dressing/bathroom   Equipment Recommendations  Other (comment) (defer to SNF)    Recommendations for Other Services      Precautions / Restrictions Precautions Precautions: Fall Recall of Precautions/Restrictions: Intact Restrictions Weight Bearing Restrictions Per Provider Order: No       Mobility Bed Mobility               General bed mobility comments: pt sitting EOB upon arrival, pt/family requesitng assist to bathroom    Transfers Overall transfer level: Needs assistance Equipment used: Rolling walker (2 wheels) Transfers: Sit to/from Stand Sit to Stand: Supervision           General transfer comment: verbal cues for hand placement     Balance Overall balance assessment: Needs assistance Sitting-balance support: Feet  supported Sitting balance-Leahy Scale: Good     Standing balance support: During functional activity, Bilateral upper extremity supported Standing balance-Leahy Scale: Fair                             ADL either performed or assessed with clinical judgement   ADL Overall ADL's : Needs assistance/impaired     Grooming: Wash/dry hands;Wash/dry face;Contact guard assist;Standing                   Statistician: Supervision/safety;Ambulation;Rolling walker (2 wheels)   Toileting- Clothing Manipulation and Hygiene: Contact guard assist;Sit to/from stand       Functional mobility during ADLs: Contact guard assist;Cueing for safety      Extremity/Trunk Assessment Upper Extremity Assessment Upper Extremity Assessment: Generalized weakness            Vision Ability to See in Adequate Light: 0 Adequate Patient Visual Report: No change from baseline     Perception     Praxis     Communication Communication Communication: Impaired Factors Affecting Communication: Hearing impaired   Cognition Arousal: Alert Behavior During Therapy: WFL for tasks assessed/performed                                 Following commands: Intact        Cueing   Cueing Techniques: Verbal cues  Exercises      Shoulder Instructions       General Comments      Pertinent Vitals/  Pain       Pain Assessment Pain Assessment: No/denies pain Pain Score: 0-No pain Pain Intervention(s): Monitored during session  Home Living                                          Prior Functioning/Environment              Frequency  Min 2X/week        Progress Toward Goals  OT Goals(current goals can now be found in the care plan section)  Progress towards OT goals: Progressing toward goals     Plan      Co-evaluation                 AM-PAC OT 6 Clicks Daily Activity     Outcome Measure   Help from another person eating  meals?: None Help from another person taking care of personal grooming?: A Little Help from another person toileting, which includes using toliet, bedpan, or urinal?: A Little Help from another person bathing (including washing, rinsing, drying)?: A Little Help from another person to put on and taking off regular upper body clothing?: A Little Help from another person to put on and taking off regular lower body clothing?: A Little 6 Click Score: 19    End of Session Equipment Utilized During Treatment: Rolling walker (2 wheels);Other (comment) (RW)  OT Visit Diagnosis: Muscle weakness (generalized) (M62.81);Pain   Activity Tolerance Patient tolerated treatment well   Patient Left in bed;with call bell/phone within reach;with family/visitor present   Nurse Communication Mobility status        Time: 8681-8668 OT Time Calculation (min): 13 min  Charges: OT General Charges $OT Visit: 1 Visit OT Treatments $Self Care/Home Management : 8-22 mins    Jacques Karna Loose 05/24/2024, 3:19 PM

## 2024-05-24 NOTE — Progress Notes (Signed)
 IV removed-CDI. Report called to [place] at [facility]. Stable patient and belongings wheeled to main entrance where she was picked up by her son who has agreed to transport patient to [facility]. Paperwork packet given to son with instructions to give to [facility] at admission. I also gave son a copy of AVS for his information.

## 2024-05-25 DIAGNOSIS — R296 Repeated falls: Secondary | ICD-10-CM | POA: Insufficient documentation

## 2024-05-25 DIAGNOSIS — R262 Difficulty in walking, not elsewhere classified: Secondary | ICD-10-CM | POA: Insufficient documentation

## 2024-05-25 DIAGNOSIS — R41841 Cognitive communication deficit: Secondary | ICD-10-CM | POA: Insufficient documentation

## 2024-05-27 NOTE — LTC Provider Review (Signed)
" °   OCCUPATIONAL THERAPY TREATMENT NOTE   Patient Name:  Hailey [name] [facility]      Medical Record Number: [id number]  Date of Birth: [birth year] Location: [facility] [place]   PT Precautions: Falls, Pacemaker/AICD Present, Foley catheter OT Precautions: Falls, foley cath SLP Precautions:  ,    Weight Bearing: RUE Weight Bearing: No Restrictions   LUE Weight Bearing: No Restrictions   RLE Weight Bearing: No Restrictions   LLE Weight Bearing: No Restrictions    SUBJECTIVE Pt tolerated session well, no complaints of pain. Pt would benefit from further skilled OT services.    OBJECTIVE Today's Treatment  Directed pt in dynamic standing balance activity including weight shifting, pivoting, crossing midline, standing unsupported, reaching outside BOS, overhead, facilitating righting reactions, to increase standing balance needed for LB ADLs and functional transfers. CGA  BUE strengthening exercise completed with monitoring of pt's response, graded resistance accordingly and provided rest breaks when needed due to fatigue in order to increase pt's activity tolerance, (I) with ADLS and BUE strength needed for UE support during sit to stands, use of handrails and RW when engaging in ADL tasks.  I attest that I have reviewed the above information. Signed: Tessarah Dalleave, OTA 05/27/2024  "

## 2024-05-29 NOTE — Progress Notes (Deleted)
 "  Cardiology Office Note    Date:  05/29/2024  ID:  Hailey [name], DOB 1946-01-25, MRN [id number] PCP:  [name], NP  Cardiologist:  Jerel Balding, MD  Electrophysiologist:  None   Chief Complaint: ***  History of Present Illness: .    Hailey [name] is a 78 y.o. female with visit-pertinent history of ***  Labwork independently reviewed:   ROS: .   *** denies chest pain, shortness of breath, lower extremity edema, fatigue, palpitations, melena, hematuria, hemoptysis, diaphoresis, weakness, presyncope, syncope, orthopnea, and PND.  All other systems are reviewed and otherwise negative.  Studies Reviewed: SABRA    EKG:  EKG is ordered today, personally reviewed, demonstrating ***     CV Studies: Cardiac studies reviewed are outlined and summarized above. Otherwise please see EMR for full report. Cardiac Studies & Procedures   ______________________________________________________________________________________________ CARDIAC CATHETERIZATION  CARDIAC CATHETERIZATION 05/03/2024  Conclusion Findings:  RA = 3 RV = 46/4 PA = 44/22 (32) PCW = 11 Fick cardiac output/index = 5.3/3.8 PVR = 4.0 WU Ao sat = 98% PA sat = 76% SVC sat = 75% PAPi = 7.3  Assessment: 1. Mild PAH with normal left-sided filling pressures and cardiac output 2. PA pressures and PCWP much improved from previous cath 3. No change in PVR with addition of low-dose PDE-5i  Plan/Discussion:  Will increase sildenafil  to 40 tid and f/u as outpatient to discuss trial of macitentan vs sotatercept.  Please arrange f/u with me in [facility] to discuss in 1-2 months.  D/w Dr. Croituro  Daniel Bensimhon, MD 12:05 PM   CARDIAC CATHETERIZATION  CARDIAC CATHETERIZATION 07/09/2022  Conclusion Findings:  RA = 12 RV = 76/11 PA = 73/27 (41) PCW = 23 Fick cardiac output/index = 3.2/2.1 PVR = 5.6 WU Ao sat = 96% PA sat = 56%, 58% PAPi = 3.8  Assessment: 1. 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 Not candidate for selective pulmonary artery vasodilators. Would push diuretics as tolerated. Ensure oxygenation > 88% at all times. Consider pulmonary rehab.  Toribio Fuel, MD 9:43 AM   STRESS TESTS  MYOCARDIAL PERFUSION IMAGING 05/30/2017  Interpretation Summary  The left ventricular ejection fraction is hyperdynamic (>65%).  Nuclear stress EF: 69%.  There was no ST segment deviation noted during stress.  Defect 1: There is a small defect of mild severity present in the mid anterior and apical anterior location.  Findings consistent with ischemia.  This is a low risk study.  There is a mild severity, small area reversible defect in the mid and apical anterior walls consistent with mild ischemia (SDS = 2).   ECHOCARDIOGRAM  ECHOCARDIOGRAM COMPLETE 04/28/2024  Narrative ECHOCARDIOGRAM REPORT    Patient Name:   Hailey [name] Date of Exam: 04/28/2024 Medical Rec #:  [id number]      Height:       59.0 in Accession #:    [id number]     Weight:       103.8 lb Date of Birth:  [birth year]      BSA:          1.396 m Patient Age:    78 years       BP:           161/83 mmHg Patient Gender: F              HR:  54 bpm. Exam Location:  Outpatient  Procedure: 2D Echo, Cardiac Doppler and Color Doppler (Both Spectral and Color Flow Doppler were utilized during procedure).  Indications:    I27.20 Pulmonary Hypertension  History:        Patient has prior history of Echocardiogram examinations, most recent 07/01/2022. CHF, CAD, Pacemaker, Arrythmias:Atrial Fibrillation; Risk Factors:Hypertension, Sleep Apnea and Diabetes.  Sonographer:    Damien Senior RDCS Referring Phys: [phone] MIHAI CROITORU  IMPRESSIONS   1. Left ventricular ejection fraction, by estimation, is 60 to 65%. The left ventricle has normal function. The left ventricle has no  regional wall motion abnormalities. Left ventricular diastolic parameters were normal. 2. Right ventricular systolic function is normal. The right ventricular size is severely enlarged. There is severely elevated pulmonary artery systolic pressure. The estimated right ventricular systolic pressure is 81.6 mmHg. 3. Left atrial size was mildly dilated. 4. The mitral valve is degenerative. Mild mitral valve regurgitation. 5. The tricuspid valve is abnormal. Tricuspid valve regurgitation is severe. 6. The aortic valve is tricuspid. Aortic valve regurgitation is mild. No aortic stenosis is present. 7. The inferior vena cava is normal in size with <50% respiratory variability, suggesting right atrial pressure of 8 mmHg.  Comparison(s): Changes from prior study are noted. No severely elevated pulmonary pressures and severely dilated RV.  Conclusion(s)/Recommendation(s): Normal BiV systolic function. Severe TR and severely elevated pulmonary pressures. The RV is severely enlarged.  FINDINGS Left Ventricle: Left ventricular ejection fraction, by estimation, is 60 to 65%. The left ventricle has normal function. The left ventricle has no regional wall motion abnormalities. The left ventricular internal cavity size was normal in size. There is no left ventricular hypertrophy. Left ventricular diastolic parameters were normal.  Right Ventricle: The right ventricular size is severely enlarged. No increase in right ventricular wall thickness. Right ventricular systolic function is normal. There is severely elevated pulmonary artery systolic pressure. The tricuspid regurgitant velocity is 4.29 m/s, and with an assumed right atrial pressure of 8 mmHg, the estimated right ventricular systolic pressure is 81.6 mmHg.  Left Atrium: Left atrial size was mildly dilated.  Right Atrium: Right atrial size was normal in size.  Pericardium: There is no evidence of pericardial effusion.  Mitral Valve: The mitral valve  is degenerative in appearance. There is mild thickening of the mitral valve leaflet(s). Mild mitral valve regurgitation.  Tricuspid Valve: The tricuspid valve is abnormal. Tricuspid valve regurgitation is severe. No evidence of tricuspid stenosis. The flow in the hepatic veins is reversed during ventricular systole.  Aortic Valve: The aortic valve is tricuspid. Aortic valve regurgitation is mild. No aortic stenosis is present.  Pulmonic Valve: The pulmonic valve was grossly normal. Pulmonic valve regurgitation is not visualized. No evidence of pulmonic stenosis.  Aorta: The aortic root and ascending aorta are structurally normal, with no evidence of dilitation.  Venous: The inferior vena cava is normal in size with less than 50% respiratory variability, suggesting right atrial pressure of 8 mmHg.  IAS/Shunts: No atrial level shunt detected by color flow Doppler.  Additional Comments: A device lead is visualized in the right atrium and right ventricle.   LEFT VENTRICLE PLAX 2D LVIDd:         4.20 cm   Diastology LVIDs:         2.50 cm   LV e' medial:    7.72 cm/s LV PW:         0.80 cm   LV E/e' medial:  11.3 LV IVS:  0.70 cm   LV e' lateral:   9.03 cm/s LVOT diam:     1.73 cm   LV E/e' lateral: 9.7 LV SV:         35 LV SV Index:   25 LVOT Area:     2.35 cm   RIGHT VENTRICLE RV S prime:     11.20 cm/s  PULMONARY VEINS TAPSE (M-mode): 1.4 cm      Diastolic Velocity: 51.90 cm/s S/D Velocity:       0.50 Systolic Velocity:  28.30 cm/s  LEFT ATRIUM             Index        RIGHT ATRIUM           Index LA diam:        3.58 cm 2.57 cm/m   RA Area:     13.20 cm LA Vol (A2C):   51.3 ml 36.76 ml/m  RA Volume:   30.20 ml  21.64 ml/m LA Vol (A4C):   55.6 ml 39.84 ml/m LA Biplane Vol: 58.6 ml 41.99 ml/m AORTIC VALVE LVOT Vmax:   71.85 cm/s LVOT Vmean:  48.600 cm/s LVOT VTI:    0.149 m  AORTA Ao Root diam: 2.54 cm Ao Asc diam:  2.56 cm  MITRAL VALVE                TRICUSPID VALVE MV Area (PHT): 4.86 cm    TR Peak grad:   73.6 mmHg MV Decel Time: 156 msec    TR Vmax:        429.00 cm/s MV E velocity: 87.50 cm/s MV A velocity: 41.40 cm/s  SHUNTS MV E/A ratio:  2.11        Systemic VTI:  0.15 m Systemic Diam: 1.73 cm  Georganna Archer Electronically signed by Georganna Archer Signature Date/Time: 04/28/2024/4:13:48 PM    Final          ______________________________________________________________________________________________       Current Reported Medications:.    Active Medications[1]  Physical Exam:    VS:  There were no vitals taken for this visit.   Wt Readings from Last 3 Encounters:  05/13/24 90 lb (40.8 kg)  04/30/24 103 lb (46.7 kg)  04/23/24 103 lb 12.8 oz (47.1 kg)    GEN: Well nourished, well developed in no acute distress NECK: No JVD; No carotid bruits CARDIAC: ***RRR, no murmurs, rubs, gallops RESPIRATORY:  Clear to auscultation without rales, wheezing or rhonchi  ABDOMEN: Soft, non-tender, non-distended EXTREMITIES:  No edema; No acute deformity     Asessement and Plan:.     ***     Disposition: F/u with ***  Signed, Bessye Stith D Basir Niven, NP      [1]  No outpatient medications have been marked as taking for the 05/31/24 encounter (Appointment) with Posey Petrik D, NP.   "

## 2024-05-31 ENCOUNTER — Ambulatory Visit: Admitting: Cardiology

## 2024-05-31 NOTE — Nursing Note (Signed)
 PO meds tolerated. Fluids accepted well for hydration. Alert and verbal, able to make needs known. Foley cath patent, draining amber color urine, no leaking noted. Resting quietly in bed, call bell in reach.

## 2024-06-01 ENCOUNTER — Telehealth: Payer: Self-pay | Admitting: Urology

## 2024-06-01 NOTE — Telephone Encounter (Signed)
 Patient needs hospital follow up has cath since Dec 10 please call daughter [phone]

## 2024-06-01 NOTE — Telephone Encounter (Signed)
 Called pt daughter she confirmed Hailey [name] had already let her know about her appointment

## 2024-06-02 ENCOUNTER — Telehealth: Payer: Self-pay | Admitting: *Deleted

## 2024-06-02 NOTE — Telephone Encounter (Signed)
 Will have Hailey [name] order X-ray during upcoming hospital follow up visit.

## 2024-06-02 NOTE — Telephone Encounter (Signed)
 Patient has an appointment scheduled for Hospital Follow up 06/21/2024. Please Advise.

## 2024-06-02 NOTE — Telephone Encounter (Signed)
 Copied from CRM #[id number]. Topic: Clinical - Request for Lab/Test Order >> Jun 02, 2024 10:39 AM Susanna ORN wrote: Reason for CRM: Patient's daughter, [name], states that the hospital discharge papers is stating for her mom to have another x-ray done within 10 days before going to the pulmonologist. She's wanting to know if Monina needs to put the orders in for the x-ray or would the pulmonologist need to order them? Please give her a call back at [phone].

## 2024-06-04 NOTE — Telephone Encounter (Signed)
 Noted

## 2024-06-04 NOTE — Telephone Encounter (Signed)
 Spoke with patient's son stated that the patient is in rehab at this time and I notified the patient family order will place at the upcoming visit when patient is seen in the office.

## 2024-06-06 NOTE — Nursing Note (Addendum)
 PATIENT IS ALERT AND ORIENTED WITH THE ABILITY TO MAKE NEEDS KNOWN, HAS NO COMPLAINT OF PAIN OR SOB. NIGHT MEDICATION WAS TOLERATED WITH NO COMPLICATIONS, FOLEY CATHETER IN PLACE WITH NO OBSTRUCTION, IS NOW IN BED RESTING WITH EYES CLOSED CALL LIGHT AND HYDRATION WITHIN REACH, EVEN AND UNLABORED BREATHING NOTED. PATIENT IS TO CONTINUE THERAPY CASE LOAD.

## 2024-06-06 NOTE — Nursing Note (Signed)
 Alert and verbal. Respirations even and non labored. No sob or cough noted. Medicated for pain. Foley intact and draining urine well. Continent of bowel. Transfers with walker to bathroom.

## 2024-06-07 ENCOUNTER — Encounter: Payer: Self-pay | Admitting: Urology

## 2024-06-07 ENCOUNTER — Ambulatory Visit: Admitting: Urology

## 2024-06-07 ENCOUNTER — Inpatient Hospital Stay: Admitting: Adult Health

## 2024-06-07 VITALS — BP 123/65 | HR 76

## 2024-06-07 DIAGNOSIS — R911 Solitary pulmonary nodule: Secondary | ICD-10-CM | POA: Diagnosis not present

## 2024-06-07 DIAGNOSIS — R339 Retention of urine, unspecified: Secondary | ICD-10-CM

## 2024-06-07 DIAGNOSIS — J9601 Acute respiratory failure with hypoxia: Secondary | ICD-10-CM | POA: Diagnosis not present

## 2024-06-07 DIAGNOSIS — I4821 Permanent atrial fibrillation: Secondary | ICD-10-CM | POA: Diagnosis not present

## 2024-06-07 DIAGNOSIS — Z8701 Personal history of pneumonia (recurrent): Secondary | ICD-10-CM | POA: Diagnosis not present

## 2024-06-07 DIAGNOSIS — K5900 Constipation, unspecified: Secondary | ICD-10-CM | POA: Diagnosis not present

## 2024-06-07 DIAGNOSIS — I2510 Atherosclerotic heart disease of native coronary artery without angina pectoris: Secondary | ICD-10-CM | POA: Diagnosis not present

## 2024-06-07 DIAGNOSIS — I5033 Acute on chronic diastolic (congestive) heart failure: Secondary | ICD-10-CM | POA: Diagnosis not present

## 2024-06-07 DIAGNOSIS — E871 Hypo-osmolality and hyponatremia: Secondary | ICD-10-CM | POA: Diagnosis not present

## 2024-06-07 DIAGNOSIS — G4733 Obstructive sleep apnea (adult) (pediatric): Secondary | ICD-10-CM | POA: Diagnosis not present

## 2024-06-07 DIAGNOSIS — Z7901 Long term (current) use of anticoagulants: Secondary | ICD-10-CM | POA: Diagnosis not present

## 2024-06-07 DIAGNOSIS — R8271 Bacteriuria: Secondary | ICD-10-CM

## 2024-06-07 DIAGNOSIS — I110 Hypertensive heart disease with heart failure: Secondary | ICD-10-CM | POA: Diagnosis not present

## 2024-06-07 MED ORDER — NITROFURANTOIN MACROCRYSTAL 100 MG PO CAPS: 100.0000 mg | ORAL_CAPSULE | Freq: Two times a day (BID) | ORAL | 0 refills | Status: AC

## 2024-06-07 NOTE — Progress Notes (Signed)
 "  06/07/2024 3:23 PM   Hailey [name] [birth year] [id number]  Referring provider: Orpha [name] LABOR, MD [address] [place],  [place] [address]  No chief complaint on file.   HPI:  1) urinary retention - seen hesitancy, inc emptying Hailey [name] 2024. Constipation episodes. PVR 93 ml. NG h/o polio, decreased mobility, SOB, CHF, pulm HTN (revatio ). Admit NOV 2025 and DEC 2025 with SOB, pleural effusion. Bladder scan 300+ and foley placed. Nov 2025 CT a/p -  Right kidney displaced inferiorly (stable). Nonobstructing stone in the RLP, midpole stone on the left appears vascular. No renal mass or hydronephrosis. Foley catheter within the bladder. Foley removed 05/04/2024 and replaced 05/12/2024. She also had constipation.  Last urine culture grew Aerococcus species.  No susceptibility.  December 2025 creatinine 0.79.  No bladder pain or gross hematuria.  On Eliquis .   06/07/2024: Today, seen for the above. She is in SNF. Ambulating. Ready for void trial. She is on tamsulosin .     PMH: Past Medical History:  Diagnosis Date   Acute on chronic diastolic CHF (congestive heart failure), NYHA class 1 (HCC) 04/29/2013   Allergic rhinitis    Brady-tachy syndrome (HCC)    s/p STJ dual chamber PPM by Dr Francyne   CAD (coronary artery disease)    70% RCA stenosis, treated medically   Control of atrial fibrillation with pacemaker (HCC)    Diabetes mellitus (HCC)    type 2   DJD (degenerative joint disease)    Dyspnea    occasional   Hearing loss    Right ear only - no hearing aids   Hypertension    Hyperthyroidism    Paroxysmal atrial fibrillation (HCC)    Pacemaker St Jude   Pneumonia    years ago- before 2012 per patient   Polio    as a child   PONV (postoperative nausea and vomiting)    Presence of permanent cardiac pacemaker    St Jude pacemaker   Pulmonary disease    Sleep apnea    uses  CPAP nightly    Surgical History: Past Surgical History:  Procedure Laterality Date   arm  surgery d/t fx Right 1996   ATRIAL FIBRILLATION ABLATION  07/31/2012; 08-10-13   PVI by Dr Kelsie   ATRIAL FIBRILLATION ABLATION N/A 07/31/2012   Procedure: ATRIAL FIBRILLATION ABLATION;  Surgeon: Lynwood Kelsie, MD;  Location[facility] CATH LAB;  Service: Cardiovascular;  Laterality: N/A;   ATRIAL FIBRILLATION ABLATION N/A 08/10/2013   Procedure: ATRIAL FIBRILLATION ABLATION;  Surgeon: Lynwood JONETTA Kelsie, MD;  Location: [facility] CATH LAB;  Service: Cardiovascular;  Laterality: N/A;   BACK SURGERY  07/26/2012   dR. nUDELMAN   BIOPSY  06/30/2023   Procedure: BIOPSY;  Surgeon: Cindie Carlin POUR, DO;  Location: [facility] ENDO SUITE;  Service: Endoscopy;;   CARDIAC CATHETERIZATION  12/03/2010   single vessel mid RCA   CARDIAC ELECTROPHYSIOLOGY MAPPING AND ABLATION  07/31/2012   Dr. Lynwood Kelsie   CESAREAN SECTION  09/20/1974   Dr. Lonni   CHOLECYSTECTOMY  1994   COLONOSCOPY WITH PROPOFOL  N/A 03/21/2015   Procedure: COLONOSCOPY WITH PROPOFOL ;  Surgeon: Renaye Sous, MD;  Location: [facility] ENDOSCOPY;  Service: Endoscopy;  Laterality: N/A;   COLONOSCOPY WITH PROPOFOL  N/A 06/30/2023   Procedure: COLONOSCOPY WITH PROPOFOL ;  Surgeon: Cindie Carlin POUR, DO;  Location: [facility] ENDO SUITE;  Service: Endoscopy;  Laterality: N/A;  11:15 AM, ASA 4   DIAGNOSTIC MAMMOGRAM  04/16/2017   Duwaine HERO. Morris, DO   ESOPHAGOGASTRODUODENOSCOPY (  EGD) WITH PROPOFOL  N/A 06/30/2023   Procedure: ESOPHAGOGASTRODUODENOSCOPY (EGD) WITH PROPOFOL ;  Surgeon: Cindie Carlin POUR, DO;  Location: [facility] ENDO SUITE;  Service: Endoscopy;  Laterality: N/A;  11:15 AM, ASA 4   EYE SURGERY Bilateral    remove cataracts   IR THORACENTESIS ASP PLEURAL SPACE W/IMG GUIDE  04/30/2024   IR THORACENTESIS ASP PLEURAL SPACE W/IMG GUIDE  05/03/2024   IR THORACENTESIS ASP PLEURAL SPACE W/IMG GUIDE  05/13/2024   LAPAROSCOPIC HYSTERECTOMY  Late 70s to early 80s   LEFT HEART CATH AND CORONARY ANGIOGRAPHY N/A 07/30/2017   Procedure: LEFT HEART CATH AND CORONARY ANGIOGRAPHY;  Surgeon: Burnard Debby LABOR, MD;  Location: [facility] INVASIVE CV LAB;  Service: Cardiovascular;  Laterality: N/A;   LUMBAR LAMINECTOMY/DECOMPRESSION MICRODISCECTOMY Right 12/23/2012   Procedure: Right L5-S1 Laminotomy for resection of synovial cyst;  Surgeon: Lamar LELON Peaches, MD;  Location: [facility] NEURO ORS;  Service: Neurosurgery;  Laterality: Right;  Right Lumbar five-sacral one Laminotomy for resection of synovial cyst   NM MYOCAR PERF WALL MOTION  12/20/2010   normal   PACEMAKER IMPLANT N/A 08/09/2019   Procedure: PACEMAKER IMPLANT;  Surgeon: Francyne Headland, MD;  Location: [facility] INVASIVE CV LAB;  Service: Cardiovascular;  Laterality: N/A;   PACEMAKER INSERTION  12/03/2010   SJM Accent DR RF implanted by Dr Francyne   POLYPECTOMY  06/30/2023   Procedure: POLYPECTOMY;  Surgeon: Cindie Carlin POUR, DO;  Location: [facility] ENDO SUITE;  Service: Endoscopy;;   RIGHT HEART CATH N/A 04/22/2018   Procedure: RIGHT HEART CATH;  Surgeon: Darron Deatrice LABOR, MD;  Location: [facility] INVASIVE CV LAB;  Service: Cardiovascular;  Laterality: N/A;   RIGHT HEART CATH N/A 07/09/2022   Procedure: RIGHT HEART CATH;  Surgeon: Cherrie Toribio SAUNDERS, MD;  Location: [facility] INVASIVE CV LAB;  Service: Cardiovascular;  Laterality: N/A;   RIGHT HEART CATH N/A 05/03/2024   Procedure: RIGHT HEART CATH;  Surgeon: Cherrie Toribio SAUNDERS, MD;  Location: [facility] INVASIVE CV LAB;  Service: Cardiovascular;  Laterality: N/A;   TEE WITHOUT CARDIOVERSION N/A 07/30/2012   Procedure: TRANSESOPHAGEAL ECHOCARDIOGRAM (TEE);  Surgeon: Headland Francyne, MD;  Location: [facility] ENDOSCOPY;  Service: Cardiovascular;  Laterality: N/A;  h&p in file-hope   TEE WITHOUT CARDIOVERSION N/A 08/09/2013   Procedure: TRANSESOPHAGEAL ECHOCARDIOGRAM (TEE);  Surgeon: Redell GORMAN Shallow, MD;  Location: [facility] ENDOSCOPY;  Service: Cardiovascular;  Laterality: N/A;   THYROID  LOBECTOMY Right 09/20/2022   Procedure: RIGHT HEMITHYROIDECTOMY;  Surgeon: Llewellyn Gerard LABOR, DO;  Location: [facility] OR;  Service: ENT;  Laterality: Right;    TOTAL ABDOMINAL HYSTERECTOMY  1981   ULTRASOUND GUIDANCE FOR VASCULAR ACCESS  04/22/2018   Procedure: Ultrasound Guidance For Vascular Access;  Surgeon: Darron Deatrice LABOR, MD;  Location: [facility] INVASIVE CV LAB;  Service: Cardiovascular;;    Home Medications:  Allergies as of 06/07/2024       Reactions   Codeine Nausea And Vomiting   Ketorolac  Nausea And Vomiting, Other (See Comments)   pt told to never take this med   Meperidine  Nausea Only, Nausea And Vomiting   Multaq  [dronedarone ] Other (See Comments)   Increased fluid retention   Xarelto [rivaroxaban] Diarrhea   Flexeril  [cyclobenzaprine ] Nausea And Vomiting   Latex Rash   Pt stated on 09/20/2022        Medication List        Accurate as of June 07, 2024  3:23 PM. If you have any questions, ask your nurse or doctor.          acetaminophen  500  MG tablet Commonly known as: TYLENOL  Take 1,000 mg by mouth daily as needed for mild pain (pain score 1-3) or moderate pain (pain score 4-6).   albuterol  108 (90 Base) MCG/ACT inhaler Commonly known as: VENTOLIN  HFA Inhale 2 puffs into the lungs every 6 (six) hours as needed for wheezing or shortness of breath.   Eliquis  5 MG Tabs tablet Generic drug: apixaban  TAKE ONE TABLET BY MOUTH TWICE DAILY   fluticasone  50 MCG/ACT nasal spray Commonly known as: FLONASE  Place 2 sprays into both nostrils daily as needed for allergies or rhinitis. What changed: when to take this   furosemide  40 MG tablet Commonly known as: LASIX  Take 1 tablet (40 mg total) by mouth daily.   methimazole  10 MG tablet Commonly known as: TAPAZOLE  Take 20 mg (two tablets) daily.   ondansetron  4 MG tablet Commonly known as: ZOFRAN  TAKE ONE TABLET BY MOUTH EVERY EIGHT HOURS AS NEEDED FOR NAUSEA OR VOMITING   pantoprazole  40 MG tablet Commonly known as: PROTONIX  TAKE ONE TABLET BY MOUTH TWICE DAILY BEFORE A MEAL What changed: See the new instructions.   sildenafil  20 MG tablet Commonly known as:  REVATIO  Take 2 tablets (40 mg total) by mouth 3 (three) times daily.   sodium chloride  0.65 % nasal spray Commonly known as: Nasal Moisturizing Spray Place 1 spray into the nose as needed for congestion.   tamsulosin  0.4 MG Caps capsule Commonly known as: FLOMAX  Take 1 capsule (0.4 mg total) by mouth daily.   tiZANidine  2 MG tablet Commonly known as: ZANAFLEX  Take 2 mg by mouth at bedtime.   traMADol  50 MG tablet Commonly known as: ULTRAM  Take 1 tablet (50 mg total) by mouth daily as needed for moderate pain (pain score 4-6) or severe pain (pain score 7-10).        Allergies: Allergies[1]  Family History: Family History  Problem Relation Age of Onset   Cancer Brother    Heart attack Sister    Breast cancer Sister    Diabetes Sister    Heart disease Sister    Breast cancer Sister    Dementia Sister    Colon cancer Sister    Heart disease Sister    Breast cancer Sister    Suicidality Brother    Prostate cancer Brother    Dementia Brother     Social History:  reports that she has never smoked. She has never used smokeless tobacco. She reports that she does not drink alcohol and does not use drugs.   Physical Exam: BP 123/65   Pulse 76   Constitutional:  Alert and oriented, No acute distress.  Frail, in wheelchair. HEENT: [place] AT, moist mucus membranes.  Trachea midline, no masses. Cardiovascular: No clubbing, cyanosis, or edema. Respiratory: Normal respiratory effort, no increased work of breathing. GI: Abdomen is soft, nontender, nondistended, no abdominal masses GU: No CVA tenderness Skin: No rashes, bruises or suspicious lesions. Neurologic: Grossly intact, no focal deficits, moving all 4 extremities. Psychiatric: Normal mood and affect. GU: Foley catheter in place.  Urine clear.  Laboratory Data: Lab Results  Component Value Date   WBC 7.1 05/23/2024   HGB 10.1 (L) 05/23/2024   HCT 31.8 (L) 05/23/2024   MCV 81.3 05/23/2024   PLT 328 05/23/2024     Lab Results  Component Value Date   CREATININE 0.70 05/24/2024    No results found for: PSA  No results found for: TESTOSTERONE  Lab Results  Component Value Date   HGBA1C 6.5 (H)  05/01/2024    Urinalysis    Component Value Date/Time   COLORURINE YELLOW 05/12/2024 1632   APPEARANCEUR CLEAR 05/12/2024 1632   APPEARANCEUR Cloudy (A) 08/05/2023 1327   LABSPEC <1.005 (L) 05/12/2024 1632   PHURINE 6.0 05/12/2024 1632   GLUCOSEU NEGATIVE 05/12/2024 1632   HGBUR TRACE (A) 05/12/2024 1632   BILIRUBINUR NEGATIVE 05/12/2024 1632   BILIRUBINUR Negative 08/05/2023 1327   KETONESUR NEGATIVE 05/12/2024 1632   PROTEINUR NEGATIVE 05/12/2024 1632   UROBILINOGEN negative (A) 04/15/2023 1403   UROBILINOGEN 0.2 02/26/2017 1930   NITRITE NEGATIVE 05/12/2024 1632   LEUKOCYTESUR NEGATIVE 05/12/2024 1632    Lab Results  Component Value Date   LABMICR See below: 08/05/2023   WBCUA 0-5 08/05/2023   LABEPIT 0-10 08/05/2023   BACTERIA NONE SEEN 05/12/2024    Pertinent Imaging:  Results for orders placed during the hospital encounter of 04/30/24  DG Abd 1 View  Narrative CLINICAL DATA:  Vomiting and constipation  EXAM: ABDOMEN - 1 VIEW  COMPARISON:  None Available.  FINDINGS: The bowel gas pattern is normal. There surgical clips in the right abdomen. No radio-opaque calculi. There are degenerative changes of the hips and spine.  IMPRESSION: Nonobstructive bowel gas pattern.   Electronically Signed By: Greig Pique M.D. On: 04/30/2024 20:56    Assessment & Plan:    Urinary retention - she ambulating and bowels are moving. She is on tamsulosin . Foley removed. Monitor UOP - wet diaper or voiding in toilet. Replace foley if painful inability to void or decompensation. Discussed foley, CIC, retention - nature r/b/a. Also discussed SP tube. Here with her daughter.   Bacteriuria-since we are removing the catheter I will treat her with nitrofurantoin  twice a day for 5  days based on her last culture.  I filled out SNF paperwork with above information, orders and antibiotic instructions.  No follow-ups on file.  Donnice Brooks, MD  [facility] [place]  [address] [place], [place] [address] [phone]      [1]  Allergies Allergen Reactions   Codeine Nausea And Vomiting   Ketorolac  Nausea And Vomiting and Other (See Comments)    pt told to never take this med   Meperidine  Nausea Only and Nausea And Vomiting   Multaq  [Dronedarone ] Other (See Comments)    Increased fluid retention   Xarelto [Rivaroxaban] Diarrhea   Flexeril  [Cyclobenzaprine ] Nausea And Vomiting   Latex Rash    Pt stated on 09/20/2022   "

## 2024-06-07 NOTE — Progress Notes (Signed)
 Catheter Removal  Patient is present today for a catheter removal per MD order  10 ml of water  was drained from the balloon. A 14 FR foley cath was removed from the bladder, no complications were noted. Patient tolerated well.  Performed by: Exie DASEN. CMA  Follow up/ Additional notes:  as scheduled

## 2024-06-09 NOTE — Care Plan (Signed)
 LTC Post Acute Care Conference [facility])  06/09/2024 2:13 PM    Patient Name: Hailey [name] MRN:  [id number] Admit Date/Time: 05/24/2024  3:20 PM Date of Birth:  Jun 19, 1945 Sex:  Female Room/Bed: T843/T843-98   Patient Active Problem List   Diagnosis Date Noted   Difficulty walking 05/25/2024   Frequent falls 05/25/2024   Cognitive communication deficit 05/25/2024   Metabolic encephalopathy 05/24/2024   Weakness generalized 05/24/2024   Urinary retention 05/24/2024   Urinary catheter in place 05/24/2024   Hyponatremia 05/24/2024   Hypokalemia 05/24/2024   Pleural effusion 05/24/2024   Tachycardia-bradycardia syndrome (CMS-HCC) 05/24/2024   Permanent atrial fibrillation    (CMS-HCC) 05/24/2024   Hyperthyroidism 05/24/2024   Gastroesophageal reflux disease 05/24/2024   Cardiac pacemaker in situ 05/24/2024   Essential hypertension, benign 05/24/2024   Hyperlipidemia 05/24/2024   Coronary atherosclerosis of native coronary artery 05/24/2024   Constipation 05/24/2024   Dysphagia 05/24/2024   Chronic diastolic heart failure (CMS-HCC) 05/24/2024   Type II diabetes mellitus with nephropathy (CMS-HCC) 05/24/2024   Type 2 diabetes mellitus with diabetic neuropathy (CMS-HCC) 05/24/2024   Obstructive uropathy 05/24/2024   Stage 3 chronic kidney disease (CMS-HCC) 11/02/2023   Pulmonary arterial hypertension (CMS-HCC) 11/02/2023   Pulmonary nodules 11/02/2023   Thyroid  goiter 11/02/2023   Atrial fibrillation    (CMS-HCC) 11/02/2023     Anticipated discharge date:  LCD 06/10/24;  home on 06/11/24  Team Updates   SW spoke in room with resident this morning in regards to her having a discharge date of 06/11/24.  SW asked her which home health she wanted to use and she asked SW to call daughter, [name].  We had a meeting already in place for later today.   SW and Rehab Manager called [name] and she just wanted to know if she has met her goals in therapy and rehab  manager discussed this with [name]. SW went over [facility] agencies;  resident and spouse have both used [facility] and been pleased.  Wanted to use them again.  SW explained how this process works.    Verified she has DME in place at home;  does not need anything.  Has a follow up per [name] with PCP (will be seeing for the first time)  Cleatus Specking at [facility] in [place]  Resident uses [facility] in [place]  We confirmed she has transportation for her hearing aide appointment this afternoon. Resident's daughter, [name], will meet her there.    Discharge Planning Barriers: none at this time  Anticipated Discharge Location: home  ADL: supervision with ADL's; mod independent with transfers  Mobility: walking 150 feet with FWW at supervision   Cognition / Communication: working with speech on cognition  Community Reintegration: supportive family

## 2024-06-10 ENCOUNTER — Telehealth: Payer: Self-pay

## 2024-06-10 NOTE — Telephone Encounter (Signed)
 Copied from CRM #[id number]. Topic: General - Other >> Jun 10, 2024  1:43 PM Burnard DEL wrote: Reason for CRM: Darice for [facility] ,called in and would like to know if they go ahead and go see patient for nursing,PT and OT,would Dr Jerrell follow for patients care? Patient is scheduled to establish care with Dr Jerrell on 06/25/2024. Patient is in skilled nursing,and has been for a few weeks now after hospital discharge but will be discharged tomorrow.

## 2024-06-10 NOTE — Telephone Encounter (Signed)
 We do not have a CB number for [facility] agent that called

## 2024-06-10 NOTE — Telephone Encounter (Signed)
 I called [facility] and they do not have patient in their system.

## 2024-06-10 NOTE — Telephone Encounter (Signed)
 Called the [place] location they gave me the number [phone] to try and call to reach karen. [facility]vmrc. Please relay Dr. Gaylin message if she calls back, thank you

## 2024-06-10 NOTE — Telephone Encounter (Signed)
 Yes, I will manage orders.  Thank you.

## 2024-06-11 ENCOUNTER — Other Ambulatory Visit: Payer: Self-pay | Admitting: Cardiovascular Disease

## 2024-06-11 NOTE — Telephone Encounter (Signed)
 Spoke to karen and [facility] is faxing over orders to us . FYI

## 2024-06-14 ENCOUNTER — Encounter: Payer: Self-pay | Admitting: Cardiovascular Disease

## 2024-06-15 ENCOUNTER — Ambulatory Visit

## 2024-06-15 DIAGNOSIS — R339 Retention of urine, unspecified: Secondary | ICD-10-CM

## 2024-06-15 LAB — BLADDER SCAN AMB NON-IMAGING: Scan Result: 54

## 2024-06-15 NOTE — Progress Notes (Signed)
 Bladder Scan completed today due to reason Urinary retention  Patient can void prior to the bladder scan. Bladder scan result: 54   Performed By: Carlos, CMA  Additional notes- Patient is scheduled to follow up with [facility] with MD, Nieves

## 2024-06-16 NOTE — Telephone Encounter (Signed)
 Happy to see that the thyroid  function is finally under control.

## 2024-06-16 NOTE — Telephone Encounter (Signed)
 I believe the metoprolol  was stopped at 1 point due to low blood pressure.  What is her blood pressure now? Not sure why the potassium was stopped, but she should be back on potassium supplements.  Please restart KCl 20 mEq twice daily.

## 2024-06-21 ENCOUNTER — Ambulatory Visit: Admitting: Adult Health

## 2024-06-21 ENCOUNTER — Other Ambulatory Visit: Payer: Self-pay

## 2024-06-21 ENCOUNTER — Ambulatory Visit: Payer: Self-pay | Admitting: Primary Care

## 2024-06-21 ENCOUNTER — Ambulatory Visit: Admitting: Primary Care

## 2024-06-21 ENCOUNTER — Ambulatory Visit (INDEPENDENT_AMBULATORY_CARE_PROVIDER_SITE_OTHER)

## 2024-06-21 VITALS — BP 120/71 | HR 85 | Temp 98.4°F | Ht 59.0 in | Wt 96.0 lb

## 2024-06-21 DIAGNOSIS — G4733 Obstructive sleep apnea (adult) (pediatric): Secondary | ICD-10-CM

## 2024-06-21 DIAGNOSIS — J90 Pleural effusion, not elsewhere classified: Secondary | ICD-10-CM

## 2024-06-21 DIAGNOSIS — R911 Solitary pulmonary nodule: Secondary | ICD-10-CM | POA: Diagnosis not present

## 2024-06-21 DIAGNOSIS — E0590 Thyrotoxicosis, unspecified without thyrotoxic crisis or storm: Secondary | ICD-10-CM

## 2024-06-21 DIAGNOSIS — I4891 Unspecified atrial fibrillation: Secondary | ICD-10-CM

## 2024-06-21 LAB — CBC WITH DIFFERENTIAL/PLATELET
Basophils Absolute: 0.1 K/uL (ref 0.0–0.1)
Basophils Relative: 1 % (ref 0.0–3.0)
Eosinophils Absolute: 0.1 K/uL (ref 0.0–0.7)
Eosinophils Relative: 1 % (ref 0.0–5.0)
HCT: 32.7 % — ABNORMAL LOW (ref 36.0–46.0)
Hemoglobin: 10.4 g/dL — ABNORMAL LOW (ref 12.0–15.0)
Lymphocytes Relative: 10.2 % — ABNORMAL LOW (ref 12.0–46.0)
Lymphs Abs: 0.8 K/uL (ref 0.7–4.0)
MCHC: 31.9 g/dL (ref 30.0–36.0)
MCV: 80.4 fl (ref 78.0–100.0)
Monocytes Absolute: 0.6 K/uL (ref 0.1–1.0)
Monocytes Relative: 8.4 % (ref 3.0–12.0)
Neutro Abs: 6 K/uL (ref 1.4–7.7)
Neutrophils Relative %: 79.4 % — ABNORMAL HIGH (ref 43.0–77.0)
Platelets: 424 K/uL — ABNORMAL HIGH (ref 150.0–400.0)
RBC: 4.07 Mil/uL (ref 3.87–5.11)
RDW: 16.9 % — ABNORMAL HIGH (ref 11.5–15.5)
WBC: 7.6 K/uL (ref 4.0–10.5)

## 2024-06-21 LAB — BASIC METABOLIC PANEL WITH GFR
BUN: 11 mg/dL (ref 6–23)
CO2: 30 meq/L (ref 19–32)
Calcium: 9.4 mg/dL (ref 8.4–10.5)
Chloride: 97 meq/L (ref 96–112)
Creatinine, Ser: 0.65 mg/dL (ref 0.40–1.20)
GFR: 83.81 mL/min
Glucose, Bld: 121 mg/dL — ABNORMAL HIGH (ref 70–99)
Potassium: 4.6 meq/L (ref 3.5–5.1)
Sodium: 135 meq/L (ref 135–145)

## 2024-06-21 NOTE — Progress Notes (Addendum)
 "  @Patient  ID: Hailey [name], female    DOB: [birth year], 79 y.o.   MRN: [id number]  No chief complaint on file.   Referring provider: Medina-Vargas, Monina C, NP  HPI: 79 year old female, never smoked. PMH significant for CHF, HTN, afib, PAH, recurrent pleural effusion, GERD, type 2 diabetes, pulmonary nodule, mild sleep apnea.   Previous [facility] pulmonary encounter:  ROV 02/26/2023 --follow-up visit for 79 year old woman with a history of multifactorial dyspnea in the setting of restrictive disease, pulmonary hypertension from A-fib, chronic diastolic CHF and systolic hypertension with possible contribution of autoimmune disease.  She is on sildenafil .  She also has a goiter that has not impacted her trachea and a new diagnosis of mild OSA.  We started her on auto titration CPAP about 9 months ago.  She underwent right hemithyroidectomy 09/20/2022. Today she reports fairly good compliance with her CPAP for the last few months until early September.  She has been dealing with leakage.  Compliance report 12/27/2022-02/24/2023 shows usage for 70% of the nights, 63% of the nights for greater than 4 hours. She did well with her thyroid  sgy. Helped her swallowing.  She was doing well w the CPAP for several months. She went back to nasal pillows w her surgery > has helped her feel better during the day. Less SOB, better exertional tolerance.    ROV 02/13/2024 --Ms. Dascenzo is 54 with a history of restrictive lung disease, obstructive sleep apnea, atrial fibrillation and hypertension with associated chronic diastolic CHF, suspected autoimmune disease.  She is on sildenafil  for presumed secondary PAH. Today she reports that she has been doing fairly well. She stopped her CPAP for a while to allow her to take care of her husband at night who is ill. She is interested in getting back on it. Her company is [employer].  She is on Eliquis , sildenafil , spironolactone , furosemide  40 mg twice daily  06/21/2024-  Interim hx  Discussed the use of AI scribe software for clinical note transcription with the patient, who gave verbal consent to proceed.  History of Present Illness Hailey [name] is a 79 year old female with recurrent pleural effusion who presents for follow-up after recent hospitalization. She is accompanied by her daughter, [name].  She was hospitalized from December 10th to December 22nd for right sided exudative pleural effusion of unclear etiology. A CT scan showed increased right pulmonary effusion and right lower lobe collapse, with consolidation likely due to atelectasis. During this period, she underwent thoracentesis, with 800 cc of fluid removed on December 11th. Cytology and pathology were negative for malignant cells. Chemical pleurodesis was performed on December 18th, and the chest tube was removed on December 21st.  She has a history of atrial fibrillation with tachy-brady syndrome, coronary artery disease, and heart failure with pulmonary hypertension. She was hospitalized in June for CHF exacerbation with an ejection fraction of 55%. A right heart catheterization on December 1st showed mild pulmonary arterial hypertension with normal left-sided pressures and cardiac output. Sildenafil  was increased to three times daily during her last hospitalization.  She feels better since discharge, with no significant worsening of shortness of breath. She experiences some tightness but no significant breathing difficulties. She uses a walker occasionally but generally gets around on her own. She has caregivers assisting with meals and cleaning.  She has a history of mild sleep apnea diagnosed in 2023 with 5.6 apneic events per hour. She was on CPAP but did not use it during her recent  hospitalization or rehab. She sleeps well without it and has no oxygen needs. No waking up gasping or choking.  She is currently taking Lasix  and pro-bnp was elevated on January 7th. She is also on Flomax  for  urinary retention, which has improved. She takes Miralax  and Senna occasionally for bowel movements.     Allergies[1]  Immunization History  Administered Date(s) Administered   Fluad Quad(high Dose 65+) 01/21/2019, 03/27/2020, 05/22/2021, 03/06/2022   Fluad Trivalent(High Dose 65+) 04/15/2023   INFLUENZA, HIGH DOSE SEASONAL PF 02/09/2018, 04/14/2024   Influenza Whole 02/19/2013   PFIZER(Purple Top)SARS-COV-2 Vaccination 07/08/2019, 07/29/2019   PNEUMOCOCCAL CONJUGATE-20 05/13/2023   Pfizer Covid-19 Vaccine Bivalent Booster 44yrs & up 05/12/2020, 05/04/2021   Pneumococcal Polysaccharide-23 02/09/2018   Pneumococcal-Unspecified 04/29/2011   Tdap 03/05/2013    Past Medical History:  Diagnosis Date   Acute on chronic diastolic CHF (congestive heart failure), NYHA class 1 (HCC) 04/29/2013   Allergic rhinitis    Brady-tachy syndrome (HCC)    s/p STJ dual chamber PPM by Dr Francyne   CAD (coronary artery disease)    70% RCA stenosis, treated medically   Control of atrial fibrillation with pacemaker (HCC)    Diabetes mellitus (HCC)    type 2   DJD (degenerative joint disease)    Dyspnea    occasional   Hearing loss    Right ear only - no hearing aids   Hypertension    Hyperthyroidism    Paroxysmal atrial fibrillation (HCC)    Pacemaker St Jude   Pneumonia    years ago- before 2012 per patient   Polio    as a child   PONV (postoperative nausea and vomiting)    Presence of permanent cardiac pacemaker    St Jude pacemaker   Pulmonary disease    Sleep apnea    uses  CPAP nightly    Tobacco History: Tobacco Use History[2] Counseling given: Not Answered   Outpatient Medications Prior to Visit  Medication Sig Dispense Refill   acetaminophen  (TYLENOL ) 500 MG tablet Take 1,000 mg by mouth daily as needed for mild pain (pain score 1-3) or moderate pain (pain score 4-6).     albuterol  (VENTOLIN  HFA) 108 (90 Base) MCG/ACT inhaler Inhale 2 puffs into the lungs every 6 (six)  hours as needed for wheezing or shortness of breath. 8 g 1   ELIQUIS  5 MG TABS tablet TAKE ONE TABLET BY MOUTH TWICE DAILY 60 tablet 5   fluticasone  (FLONASE ) 50 MCG/ACT nasal spray Place 2 sprays into both nostrils daily as needed for allergies or rhinitis. (Patient taking differently: Place 2 sprays into both nostrils daily.) 16 g 6   furosemide  (LASIX ) 40 MG tablet Take 1 tablet (40 mg total) by mouth daily.     methimazole  (TAPAZOLE ) 10 MG tablet Take 20 mg (two tablets) daily.     ondansetron  (ZOFRAN ) 4 MG tablet TAKE ONE TABLET BY MOUTH EVERY EIGHT HOURS AS NEEDED FOR NAUSEA OR VOMITING 20 tablet 0   pantoprazole  (PROTONIX ) 40 MG tablet TAKE ONE TABLET BY MOUTH TWICE DAILY BEFORE A MEAL (Patient taking differently: Take 40 mg by mouth daily.) 60 tablet 5   sildenafil  (REVATIO ) 20 MG tablet Take 2 tablets (40 mg total) by mouth 3 (three) times daily. 180 tablet 2   sodium chloride  (NASAL MOISTURIZING SPRAY) 0.65 % nasal spray Place 1 spray into the nose as needed for congestion.     tamsulosin  (FLOMAX ) 0.4 MG CAPS capsule Take 1 capsule (0.4 mg total) by  mouth daily. 30 capsule 0   tiZANidine  (ZANAFLEX ) 2 MG tablet Take 2 mg by mouth at bedtime.     traMADol  (ULTRAM ) 50 MG tablet Take 1 tablet (50 mg total) by mouth daily as needed for moderate pain (pain score 4-6) or severe pain (pain score 7-10). 30 tablet 0   No facility-administered medications prior to visit.      Review of Systems  Review of Systems  Constitutional:  Positive for fatigue. Negative for fever.  Respiratory:  Negative for cough and shortness of breath.   Cardiovascular: Negative.  Negative for chest pain and leg swelling.   Physical Exam  There were no vitals taken for this visit. Physical Exam Constitutional:      Appearance: Normal appearance. She is well-developed.     Comments: Well appearing  HENT:     Head: Normocephalic and atraumatic.     Mouth/Throat:     Mouth: Mucous membranes are moist.      Pharynx: Oropharynx is clear.  Eyes:     Pupils: Pupils are equal, round, and reactive to light.  Cardiovascular:     Rate and Rhythm: Normal rate. Rhythm irregular.     Heart sounds: Normal heart sounds. No murmur heard. Pulmonary:     Effort: Pulmonary effort is normal. No respiratory distress.     Breath sounds: Normal breath sounds. No wheezing or rhonchi.  Musculoskeletal:        General: Normal range of motion.     Cervical back: Normal range of motion and neck supple.  Skin:    General: Skin is warm and dry.     Findings: No erythema or rash.  Neurological:     General: No focal deficit present.     Mental Status: She is alert and oriented to person, place, and time. Mental status is at baseline.  Psychiatric:        Mood and Affect: Mood normal.        Behavior: Behavior normal.        Thought Content: Thought content normal.        Judgment: Judgment normal.      Lab Results:  CBC    Component Value Date/Time   WBC 7.1 05/23/2024 0310   RBC 3.91 05/23/2024 0310   HGB 10.1 (L) 05/23/2024 0310   HGB 10.3 (L) 08/11/2023 1514   HCT 31.8 (L) 05/23/2024 0310   HCT 35.4 08/11/2023 1514   PLT 328 05/23/2024 0310   PLT 353 08/11/2023 1514   MCV 81.3 05/23/2024 0310   MCV 80 08/11/2023 1514   MCH 25.8 (L) 05/23/2024 0310   MCHC 31.8 05/23/2024 0310   RDW 15.5 05/23/2024 0310   RDW 14.5 08/11/2023 1514   LYMPHSABS 1.0 05/16/2024 0623   LYMPHSABS 1.0 08/11/2023 1514   MONOABS 0.7 05/16/2024 0623   EOSABS 0.1 05/16/2024 0623   EOSABS 0.1 08/11/2023 1514   BASOSABS 0.0 05/16/2024 0623   BASOSABS 0.1 08/11/2023 1514    BMET    Component Value Date/Time   NA 127 (L) 05/24/2024 0842   NA 134 01/12/2024 1401   K 3.6 05/24/2024 0842   CL 85 (L) 05/24/2024 0842   CO2 29 05/24/2024 0842   GLUCOSE 113 (H) 05/24/2024 0842   BUN 11 05/24/2024 0842   BUN 15 01/12/2024 1401   CREATININE 0.70 05/24/2024 0842   CREATININE 0.83 04/23/2024 1356   CALCIUM  8.7 (L)  05/24/2024 0842   GFRNONAA >60 05/24/2024 0842   GFRNONAA 43 (L)  11/13/2020 0955   GFRAA 50 (L) 11/13/2020 0955    BNP    Component Value Date/Time   BNP 151.2 (H) 04/30/2024 1155    ProBNP    Component Value Date/Time   PROBNP 2,352 (H) 04/28/2024 1548   PROBNP 1,371.0 (H) 05/26/2013 1132    Imaging: DG CHEST PORT 1 VIEW Result Date: 05/24/2024 CLINICAL DATA:  Chest tube removal. EXAM: PORTABLE CHEST 1 VIEW COMPARISON:  05/21/2024 FINDINGS: Right-sided pleural drain is been removed in the interval. No discernible pneumothorax. Right base atelectasis with trace effusion evident. Cardiopericardial silhouette is at upper limits of normal for size. Left-sided permanent pacemaker again noted. IMPRESSION: Interval removal of right-sided pleural drain without discernible pneumothorax. Electronically Signed   By: Camellia Candle M.D.   On: 05/24/2024 07:24     Assessment & Plan:   1. Pulmonary nodule (Primary)  2. Obstructive sleep apnea  3. Pleural effusion   Assessment and Plan Assessment & Plan Recurrent right-sided exudative pleural effusion, status post pleurodesis Recurrent right-sided exudative pleural effusion, status post pleurodesis on December 18th. No evidence of large reaccumulation of fluid on chest x-ray today. Cytology and pathology negative for malignancy. No clear etiology identified, but chronic inflammation suspected. No surgical intervention recommended by cardiothoracic surgery. Symptoms well-managed with no significant shortness of breath or respiratory distress. - Ordered pro BNP, CBC with differential, kidney function tests, and thyroid  profile. - Will follow up with cardiologist on Wednesday. - Will schedule follow-up with Dr. Shelah in 8-12 weeks for repeat chest x-ray.  Pulmonary nodule Bilateral pulmonary nodules noted on imaging, maximally 8mm. Nodule present back to at least March 2023, considered benign.   Obstructive sleep apnea, mild Mild  obstructive sleep apnea diagnosed in 2023 with 5.6 apneic events per hour. Currently not using CPAP after recent hospitalization. No significant symptoms of sleep apnea reported. Discussed potential for positional therapy and monitoring oxygen levels overnight to assess need for CPAP. - Ordered overnight oximetry test to monitor oxygen levels during sleep on room air. - Will consider resuming CPAP if overnight oximetry shows significant oxygen desaturation.  I personally spent a total of 40 minutes in the care of the patient today including performing a medically appropriate exam/evaluation, counseling and educating, placing orders, independently interpreting results, and coordinating care.    Almarie LELON Ferrari, NP 06/21/2024     [1]  Allergies Allergen Reactions   Codeine Nausea And Vomiting   Ketorolac  Nausea And Vomiting and Other (See Comments)    pt told to never take this med   Meperidine  Nausea Only and Nausea And Vomiting   Multaq  [Dronedarone ] Other (See Comments)    Increased fluid retention   Xarelto [Rivaroxaban] Diarrhea   Flexeril  [Cyclobenzaprine ] Nausea And Vomiting   Latex Rash    Pt stated on 09/20/2022  [2]  Social History Tobacco Use  Smoking Status Never  Smokeless Tobacco Never   "

## 2024-06-21 NOTE — Addendum Note (Signed)
 Addended by: ROLANDA POWELL SAILOR on: 06/21/2024 01:01 PM   Modules accepted: Orders

## 2024-06-21 NOTE — Progress Notes (Signed)
 Kidney function and WBC normal. Slightly anemic but at baseline, no intervention needed a this time. Awaiting thyroid  and BNP labs to come back

## 2024-06-21 NOTE — Patient Instructions (Signed)
" °  VISIT SUMMARY: During your visit today, we discussed your recent hospitalization for recurrent pleural effusion and reviewed your current health status. You are feeling better since discharge, with no significant worsening of shortness of breath. We also addressed your mild sleep apnea and the presence of bilateral pulmonary nodules.  YOUR PLAN: -RECURRENT RIGHT-SIDED EXUDATIVE PLEURAL EFFUSION: Recurrent right-sided exudative pleural effusion means that fluid has repeatedly accumulated in the space around your right lung. This can cause breathing difficulties. You had a procedure called pleurodesis to prevent fluid buildup, and recent tests show no large reaccumulation of fluid. We have ordered blood tests and will follow up with your cardiologist and schedule a repeat chest x-ray in 8-12 weeks.  -PULMONARY NODULE: Pulmonary nodules are small growths in the lungs. Your imaging shows bilateral nodules, but there is no evidence of cancer. We will continue to monitor these nodules with follow-up imaging as needed.  -OBSTRUCTIVE SLEEP APNEA, MILD: Obstructive sleep apnea is a condition where your breathing stops and starts during sleep. You have a mild form of this condition. We discussed monitoring your oxygen levels overnight and considering positional therapy. We have ordered an overnight oximetry test to check your oxygen levels during sleep and will consider resuming CPAP if needed.  INSTRUCTIONS: Please follow up with your cardiologist on Wednesday. We will schedule a follow-up appointment with Dr. Ruther in 8-12 weeks for a repeat chest x-ray. Additionally, complete the overnight oximetry test as ordered to monitor your oxygen levels during sleep.  Follow-up 8-12 weeks with Dr. Shelah  "

## 2024-06-21 NOTE — Progress Notes (Signed)
 Please let patient know CXR showed small right pleural effusion, improved compared to Dec 10th. Chronic coarsened interstitial markings. Please order high resolution CT chest with ILD protocol to ensure patient does not have interstitial lung disease.

## 2024-06-22 ENCOUNTER — Encounter: Admitting: Student in an Organized Health Care Education/Training Program

## 2024-06-22 LAB — THYROID PANEL
Free Thyroxine Index: 1.5 (ref 1.2–4.9)
T3 Uptake Ratio: 24 % (ref 24–39)
T4, Total: 6.4 ug/dL (ref 4.5–12.0)

## 2024-06-22 LAB — PRO B NATRIURETIC PEPTIDE: NT-Pro BNP: 1539 pg/mL — ABNORMAL HIGH (ref 0–738)

## 2024-06-22 NOTE — Progress Notes (Signed)
 Please let patient know BNP was elevated but improved compared to last month 1,539 (2,353). Follow up with cardiology as scheduled

## 2024-06-23 ENCOUNTER — Ambulatory Visit: Attending: Cardiology | Admitting: Cardiovascular Disease

## 2024-06-23 ENCOUNTER — Encounter: Payer: Self-pay | Admitting: Cardiovascular Disease

## 2024-06-23 VITALS — BP 122/50 | HR 81 | Ht 59.0 in | Wt 95.8 lb

## 2024-06-23 DIAGNOSIS — I495 Sick sinus syndrome: Secondary | ICD-10-CM | POA: Insufficient documentation

## 2024-06-23 DIAGNOSIS — I4821 Permanent atrial fibrillation: Secondary | ICD-10-CM | POA: Diagnosis present

## 2024-06-23 DIAGNOSIS — E0500 Thyrotoxicosis with diffuse goiter without thyrotoxic crisis or storm: Secondary | ICD-10-CM | POA: Diagnosis present

## 2024-06-23 DIAGNOSIS — I5032 Chronic diastolic (congestive) heart failure: Secondary | ICD-10-CM | POA: Diagnosis present

## 2024-06-23 DIAGNOSIS — E118 Type 2 diabetes mellitus with unspecified complications: Secondary | ICD-10-CM | POA: Insufficient documentation

## 2024-06-23 DIAGNOSIS — I441 Atrioventricular block, second degree: Secondary | ICD-10-CM | POA: Insufficient documentation

## 2024-06-23 DIAGNOSIS — D6869 Other thrombophilia: Secondary | ICD-10-CM | POA: Diagnosis present

## 2024-06-23 DIAGNOSIS — I2721 Secondary pulmonary arterial hypertension: Secondary | ICD-10-CM | POA: Diagnosis present

## 2024-06-23 DIAGNOSIS — Z950 Presence of cardiac pacemaker: Secondary | ICD-10-CM | POA: Diagnosis present

## 2024-06-23 NOTE — Patient Instructions (Addendum)
 Medication Instructions:  No changes *If you need a refill on your cardiac medications before your next appointment, please call your pharmacy*  Lab Work: None ordered If you have labs (blood work) drawn today and your tests are completely normal, you will receive your results only by: MyChart Message (if you have MyChart) OR A paper copy in the mail If you have any lab test that is abnormal or we need to change your treatment, we will call you to review the results.  Testing/Procedures: None ordered  Follow-Up: At [facility], you and your health needs are our priority.  As part of our continuing mission to provide you with exceptional heart care, our providers are all part of one team.  This team includes your primary Cardiologist (physician) and Advanced Practice Providers or APPs (Physician Assistants and Nurse Practitioners) who all work together to provide you with the care you need, when you need it.  Your next appointment:    3-4 months- 09/23/24 at 10:20  Provider:   Jerel Balding, MD    We recommend signing up for the patient portal called MyChart.  Sign up information is provided on this After Visit Summary.  MyChart is used to connect with patients for Virtual Visits (Telemedicine).  Patients are able to view lab/test results, encounter notes, upcoming appointments, etc.  Non-urgent messages can be sent to your provider as well.   To learn more about what you can do with MyChart, go to [email or web address].

## 2024-06-23 NOTE — Progress Notes (Unsigned)
 Patient ID: Hailey [name], female   DOB: 05-24-1946, 79 y.o.   MRN: [id number]    Cardiology Office Note    Date:  06/25/2024   ID:  Hailey, [name] 1945-11-11, MRN [id number]  PCP:  Hailey Cleatus Ned, MD  Cardiologist:  Lynwood Rakers, MD;  Jerel Balding, MD   No chief complaint on file.   History of Present Illness:  Hailey [name] is a 79 y.o. female with atrial fibrillation (s/p RF ablation procedures, now with longstanding persistent atrial fibrillation), s/p dual-chamber permanent pacemaker (St. Jude Assurity, initial device 2012 for tachycardia-bradycardia syndrome and 2:1 atrioventricular block, generator change and new right ventricular lead for high pacing thresholds March 2021, has an abandoned right ventricular lead so the system is not MRI conditional), chronic diastolic heart failure (history of acute decompensation on Multaq ), multifactorial pulmonary artery hypertension, CAD (nonobstructive by cath February 2019), type 2 diabetes mellitus, hypertension, good lipid profile without therapy, s/p right hemithyroidectomy for large goiter, hyperthyroidism.  She is accompanied today by her daughter Hailey [name].  [name]'s husband Hailey [name] is also my patient.  She has made a slow recovery from her hospitalization in December and subsequent stint in inpatient rehab.  She is now finally euthyroid.  She is put on a couple of pounds and feels that she has a little more stamina.  She remains substantially underweight with a BMI of 19 and remains borderline hypotensive at 122/50, even after we stopped her metoprolol  and spironolactone .  Ventricular rate control, and atrial fibrillation remains reasonable, although her average heart rate is substantially faster than in the past.  She has roughly 49% ventricular pacing (before while taking metoprolol  had virtually 100% ventricular paced rhythm).  She chronically sleeps with the head of the bed elevated and it is hard to say whether she has any  orthopnea, but she has only mild exertional dyspnea.  Is not using CPAP.  Denies dizziness, syncope, falls or bleeding problems.  Not aware of any palpitations.  Has not had chest pain.  Pacemaker function is normal except for recent increase in ventricular lead output due to failure of the auto capture feature.  Manually checked capture threshold today was unchanged at 0.75 V at 0.5 ms pulse width.  Changed her device to fixed output at 2.5 V at 0.5 ms pulse width to avoid future events like this.  All other lead parameters are excellent.  Estimated generator longevity is about 7 years.  There have been no episodes of ventricular tachycardia.  In the past, she felt subjectively better when she has ventricular pacing rather than native rhythm.  During the recent hospitalization December right heart catheterization once again showed that she has primarily precapillary pulmonary hypertension (with an intermittent and lesser contribution of left ventricular diastolic heart failure): After diuresis mean RA pressure 3, PA pressure 44/22 (mean 32), PAWP mean 11 mmHg in the setting of normal cardiac output and an estimated PVR of 4.0 Wood units.  Mixed PA sat was 76%, papi 7.3.  Her echocardiogram performed before diuresis and hospitalization had shown an even higher systolic PA pressure estimate at 81.6 mmHg with a severely enlarged right ventricle but normal right ventricular systolic function.  Left ventricular ejection fraction was normal at 60 to 65% and her left atrium was only mildly dilated.  There was severe tricuspid regurgitation, but no other significant valvular abnormalities.  We checked again for any evidence of autoimmune causes for her pulmonary artery hypertension.  Once again her ANA is positive (  titer 1: 160), but all the other more specific antibodies were negative  She is back on sildenafil  20 mg 3 times daily and we had discussed with Dr. Bensimhon a plan to tentatively titrate this up to  40 mg 3 times daily to see if there is any positive impact on her PA pressure as well as a trial of macitentan versus sotatercept.  She will have a follow-up with him next month.  By comparison, echo in January 2024 showed normal left ventricular systolic function but estimated her systolic PA pressure to be about 60 mmHg.  Her right ventricle was moderately depressed and there was evidence of increased right heart filling pressures.  Right heart catheterization confirmed pulmonary hypertension 73/27 (mean 41), but there was also evidence of elevated left heart filling pressures with a wedge pressure of 23 (transpulmonary gradient 18).  PVR was 5.6 Woods units.  She had diminished cardiac output (index 2.1).    echocardiogram July 2020 showed normal left ventricular systolic function and EF of 60-65%, pseudonormal mitral inflow, moderately enlarged right ventricle, biatrial mild dilation, estimated systolic PA pressure 40 mmHg.  Previous right heart catheterization showed that her pulmonary hypertension could not be explained exclusively based on elevated left heart filling pressures.  She was evaluated in the rheumatology clinic by Dr. Mai since she had an elevated ANA of 1: 640, but all her other serological markers were negative for lupus, rheumatoid arthritis or other connective tissue diseases.  Chest CT has shown numerous bilateral pulmonary nodules felt to be nonspecific.  These have been present back as far as 2012.    Repeat echo in January 2024 showed evidence of dilation and weakening of the right ventricle and right heart catheterization showed PA pressure 73/27 (mean 41), wedge pressure 23, transpulmonary gradient 18, PVR 5.6 Woods units, cardiac index 2.1, PAP 3.8, mixed venous sat 56-58%.  Past Medical History:  Diagnosis Date   Acute on chronic diastolic CHF (congestive heart failure), NYHA class 1 (HCC) 04/29/2013   Allergic rhinitis    Brady-tachy syndrome (HCC)    s/p STJ dual  chamber PPM by Dr Francyne   CAD (coronary artery disease)    70% RCA stenosis, treated medically   Control of atrial fibrillation with pacemaker (HCC)    Diabetes mellitus (HCC)    type 2   DJD (degenerative joint disease)    Dyspnea    occasional   Hearing loss    Right ear only - no hearing aids   Hypertension    Hyperthyroidism    Multinodular goiter 02/20/2017   Paroxysmal atrial fibrillation (HCC)    Pacemaker St Jude   Pneumonia    years ago- before 2012 per patient   Polio    as a child   PONV (postoperative nausea and vomiting)    Presence of permanent cardiac pacemaker    St Jude pacemaker   Pulmonary disease    Second degree AV block 10/10/2015   Sleep apnea    uses  CPAP nightly    Past Surgical History:  Procedure Laterality Date   arm surgery d/t fx Right 1996   ATRIAL FIBRILLATION ABLATION  07/31/2012; 08-10-13   PVI by Dr Kelsie   ATRIAL FIBRILLATION ABLATION N/A 07/31/2012   Procedure: ATRIAL FIBRILLATION ABLATION;  Surgeon: Lynwood Kelsie, MD;  Location: [facility] CATH LAB;  Service: Cardiovascular;  Laterality: N/A;   ATRIAL FIBRILLATION ABLATION N/A 08/10/2013   Procedure: ATRIAL FIBRILLATION ABLATION;  Surgeon: Lynwood JONETTA Kelsie, MD;  Location: [facility]  CATH LAB;  Service: Cardiovascular;  Laterality: N/A;   BACK SURGERY  07/26/2012   dR. nUDELMAN   BIOPSY  06/30/2023   Procedure: BIOPSY;  Surgeon: Cindie Carlin POUR, DO;  Location: [facility] ENDO SUITE;  Service: Endoscopy;;   CARDIAC CATHETERIZATION  12/03/2010   single vessel mid RCA   CARDIAC ELECTROPHYSIOLOGY MAPPING AND ABLATION  07/31/2012   Dr. Lynwood Rakers   CESAREAN SECTION  09/20/1974   Dr. Lonni   CHOLECYSTECTOMY  1994   COLONOSCOPY WITH PROPOFOL  N/A 03/21/2015   Procedure: COLONOSCOPY WITH PROPOFOL ;  Surgeon: Renaye Sous, MD;  Location: [facility] ENDOSCOPY;  Service: Endoscopy;  Laterality: N/A;   COLONOSCOPY WITH PROPOFOL  N/A 06/30/2023   Procedure: COLONOSCOPY WITH PROPOFOL ;  Surgeon: Cindie Carlin POUR, DO;   Location: [facility] ENDO SUITE;  Service: Endoscopy;  Laterality: N/A;  11:15 AM, ASA 4   DIAGNOSTIC MAMMOGRAM  04/16/2017   Duwaine HERO. Morris, DO   ESOPHAGOGASTRODUODENOSCOPY (EGD) WITH PROPOFOL  N/A 06/30/2023   Procedure: ESOPHAGOGASTRODUODENOSCOPY (EGD) WITH PROPOFOL ;  Surgeon: Cindie Carlin POUR, DO;  Location: [facility] ENDO SUITE;  Service: Endoscopy;  Laterality: N/A;  11:15 AM, ASA 4   EYE SURGERY Bilateral    remove cataracts   IR THORACENTESIS RIGHT ASP PLEURAL SPACE W/IMG GUIDE  04/30/2024   IR THORACENTESIS RIGHT ASP PLEURAL SPACE W/IMG GUIDE  05/03/2024   IR THORACENTESIS RIGHT ASP PLEURAL SPACE W/IMG GUIDE  05/13/2024   LAPAROSCOPIC HYSTERECTOMY  Late 70s to early 80s   LEFT HEART CATH AND CORONARY ANGIOGRAPHY N/A 07/30/2017   Procedure: LEFT HEART CATH AND CORONARY ANGIOGRAPHY;  Surgeon: Burnard Debby LABOR, MD;  Location: [facility] INVASIVE CV LAB;  Service: Cardiovascular;  Laterality: N/A;   LUMBAR LAMINECTOMY/DECOMPRESSION MICRODISCECTOMY Right 12/23/2012   Procedure: Right L5-S1 Laminotomy for resection of synovial cyst;  Surgeon: Lamar LELON Peaches, MD;  Location: [facility] NEURO ORS;  Service: Neurosurgery;  Laterality: Right;  Right Lumbar five-sacral one Laminotomy for resection of synovial cyst   NM MYOCAR PERF WALL MOTION  12/20/2010   normal   PACEMAKER IMPLANT N/A 08/09/2019   Procedure: PACEMAKER IMPLANT;  Surgeon: Francyne Headland, MD;  Location: [facility] INVASIVE CV LAB;  Service: Cardiovascular;  Laterality: N/A;   PACEMAKER INSERTION  12/03/2010   SJM Accent DR RF implanted by Dr Francyne   POLYPECTOMY  06/30/2023   Procedure: POLYPECTOMY;  Surgeon: Cindie Carlin POUR, DO;  Location: [facility] ENDO SUITE;  Service: Endoscopy;;   RIGHT HEART CATH N/A 04/22/2018   Procedure: RIGHT HEART CATH;  Surgeon: Darron Deatrice LABOR, MD;  Location: [facility] INVASIVE CV LAB;  Service: Cardiovascular;  Laterality: N/A;   RIGHT HEART CATH N/A 07/09/2022   Procedure: RIGHT HEART CATH;  Surgeon: Cherrie Toribio SAUNDERS, MD;  Location: [facility]  INVASIVE CV LAB;  Service: Cardiovascular;  Laterality: N/A;   RIGHT HEART CATH N/A 05/03/2024   Procedure: RIGHT HEART CATH;  Surgeon: Cherrie Toribio SAUNDERS, MD;  Location: [facility] INVASIVE CV LAB;  Service: Cardiovascular;  Laterality: N/A;   TEE WITHOUT CARDIOVERSION N/A 07/30/2012   Procedure: TRANSESOPHAGEAL ECHOCARDIOGRAM (TEE);  Surgeon: Headland Francyne, MD;  Location: [facility] ENDOSCOPY;  Service: Cardiovascular;  Laterality: N/A;  h&p in file-hope   TEE WITHOUT CARDIOVERSION N/A 08/09/2013   Procedure: TRANSESOPHAGEAL ECHOCARDIOGRAM (TEE);  Surgeon: Redell GORMAN Shallow, MD;  Location: [facility] ENDOSCOPY;  Service: Cardiovascular;  Laterality: N/A;   THYROID  LOBECTOMY Right 09/20/2022   Procedure: RIGHT HEMITHYROIDECTOMY;  Surgeon: Llewellyn Gerard LABOR, DO;  Location: [facility] OR;  Service: ENT;  Laterality: Right;   TOTAL ABDOMINAL HYSTERECTOMY  1981   ULTRASOUND GUIDANCE FOR VASCULAR ACCESS  04/22/2018   Procedure: Ultrasound Guidance For Vascular Access;  Surgeon: Darron Deatrice LABOR, MD;  Location: [facility] INVASIVE CV LAB;  Service: Cardiovascular;;    Current Medications: Outpatient Medications Prior to Visit  Medication Sig Dispense Refill   ELIQUIS  5 MG TABS tablet TAKE ONE TABLET BY MOUTH TWICE DAILY 60 tablet 5   furosemide  (LASIX ) 40 MG tablet Take 1 tablet (40 mg total) by mouth daily.     methimazole  (TAPAZOLE ) 10 MG tablet Take 20 mg (two tablets) daily.     pantoprazole  (PROTONIX ) 40 MG tablet TAKE ONE TABLET BY MOUTH TWICE DAILY BEFORE A MEAL 60 tablet 5   potassium chloride  SA (KLOR-CON  M) 20 MEQ tablet Take 20 mEq by mouth 2 (two) times daily.     sildenafil  (REVATIO ) 20 MG tablet Take 2 tablets (40 mg total) by mouth 3 (three) times daily. 180 tablet 2   tamsulosin  (FLOMAX ) 0.4 MG CAPS capsule Take 1 capsule (0.4 mg total) by mouth daily. 30 capsule 0   tiZANidine  (ZANAFLEX ) 2 MG tablet Take 2 mg by mouth at bedtime.     fluticasone  (FLONASE ) 50 MCG/ACT nasal spray Place 2 sprays into both nostrils daily  as needed for allergies or rhinitis. 16 g 6   acetaminophen  (TYLENOL ) 500 MG tablet Take 1,000 mg by mouth daily as needed for mild pain (pain score 1-3) or moderate pain (pain score 4-6).     albuterol  (VENTOLIN  HFA) 108 (90 Base) MCG/ACT inhaler Inhale 2 puffs into the lungs every 6 (six) hours as needed for wheezing or shortness of breath. (Patient not taking: Reported on 06/23/2024) 8 g 1   ondansetron  (ZOFRAN ) 4 MG tablet TAKE ONE TABLET BY MOUTH EVERY EIGHT HOURS AS NEEDED FOR NAUSEA OR VOMITING 20 tablet 0   sodium chloride  (NASAL MOISTURIZING SPRAY) 0.65 % nasal spray Place 1 spray into the nose as needed for congestion.     traMADol  (ULTRAM ) 50 MG tablet Take 1 tablet (50 mg total) by mouth daily as needed for moderate pain (pain score 4-6) or severe pain (pain score 7-10). 30 tablet 0   No facility-administered medications prior to visit.     Allergies:   Codeine, Ketorolac , Meperidine , Multaq  [dronedarone ], Xarelto [rivaroxaban], Flexeril  [cyclobenzaprine ], and Latex       Family History:  The patient's family history includes Breast cancer in her sister, sister, and sister; Cancer in her brother; Colon cancer in her sister; Dementia in her brother and sister; Diabetes in her sister; Heart attack in her sister; Heart disease in her sister and sister; Prostate cancer in her brother; Suicidality in her brother.   ROS:   Please see the history of present illness.    All other systems are reviewed and are negative.   PHYSICAL EXAM:   VS:  BP (!) 122/50 (BP Location: Left Arm, Patient Position: Sitting, Cuff Size: Small)   Pulse 81   Ht 4' 11 (1.499 m)   Wt 95 lb 12.8 oz (43.5 kg)   SpO2 98%   BMI 19.35 kg/m      General: Alert, oriented x3, no distress, underweight.  Borderline cachectic.  Healthy left subclavian pacemaker site Head: no evidence of trauma, PERRL, EOMI, no exophtalmos or lid lag, no myxedema, no xanthelasma; normal ears, nose and oropharynx Neck: normal  jugular venous pulsations and no hepatojugular reflux; brisk carotid pulses without delay and no carotid bruits Chest: clear to auscultation, no signs of consolidation by percussion  or palpation, normal fremitus, symmetrical and full respiratory excursions Cardiovascular: normal position and quality of the apical impulse, irregular rhythm, normal first and second heart sounds, 1-2/6 holosystolic murmur at the left lower sternal border, no diastolic murmurs, rubs or gallops Abdomen: no tenderness or distention, no masses by palpation, no abnormal pulsatility or arterial bruits, normal bowel sounds, no hepatosplenomegaly Extremities: no clubbing, cyanosis or edema; 2+ radial, ulnar and brachial pulses bilaterally; 2+ right femoral, posterior tibial and dorsalis pedis pulses; 2+ left femoral, posterior tibial and dorsalis pedis pulses; no subclavian or femoral bruits Neurological: grossly nonfocal Psych: Normal mood and affect    Wt Readings from Last 3 Encounters:  06/25/24 97 lb (44 kg)  06/23/24 95 lb 12.8 oz (43.5 kg)  06/21/24 96 lb (43.5 kg)     Studies Reviewed: SABRA   Comprehensive interrogation of her pacemaker (dual-chamber device programmed VVIR due to permanent atrial fibrillation) described above  Personally reviewed her most recent ECG tracing from 05/13/2024 which shows atrial fibrillation  Echocardiogram 04/28/2024  1. Left ventricular ejection fraction, by estimation, is 60 to 65%. The  left ventricle has normal function. The left ventricle has no regional  wall motion abnormalities. Left ventricular diastolic parameters were  normal.   2. Right ventricular systolic function is normal. The right ventricular  size is severely enlarged. There is severely elevated pulmonary artery  systolic pressure. The estimated right ventricular systolic pressure is  81.6 mmHg.   3. Left atrial size was mildly dilated.   4. The mitral valve is degenerative. Mild mitral valve regurgitation.    5. The tricuspid valve is abnormal. Tricuspid valve regurgitation is  severe.   6. The aortic valve is tricuspid. Aortic valve regurgitation is mild. No  aortic stenosis is present.   7. The inferior vena cava is normal in size with <50% respiratory  variability, suggesting right atrial pressure of 8 mmHg.   Comparison(s): Changes from prior study are noted. No severely elevated  pulmonary pressures and severely dilated RV.   Conclusion(s)/Recommendation(s): Normal BiV systolic function. Severe TR  and severely elevated pulmonary pressures. The RV is severely enlarged.   Right heart catheterization 05/03/2024  Findings:   RA = 3 RV = 46/4 PA = 44/22 (32) PCW = 11 Fick cardiac output/index = 5.3/3.8 PVR = 4.0 WU Ao sat = 98% PA sat = 76% SVC sat = 75% PAPi = 7.3   Assessment: 1. Mild PAH with normal left-sided filling pressures and cardiac output 2. PA pressures and PCWP much improved from previous cath 3. No change in PVR with addition of low-dose PDE-5i   Plan/Discussion:    Will increase sildenafil  to 40 tid and f/u as outpatient to discuss trial of macitentan vs sotatercept.    Please arrange f/u with me in [facility] to discuss in 1-2 months.    By comparison Right heart catheterization 07/09/2022 (weight 127.9 pounds) RA = 12 RV = 76/11 PA = 73/27 (41) PCW = 23 Fick cardiac output/index = 3.2/2.1 PVR = 5.6 WU Ao sat = 96% PA sat = 56%, 58% PAPi = 3.8   Assessment: 1. Mixed moderate pulmonary venous and pulmonary arterial HTN with elevated filling pressures and low cardiac output   Plan/Discussion:    Options limited. Not candidate for selective pulmonary artery vasodilators. Would push diuretics as tolerated. Ensure oxygenation > 88% at all times. Consider pulmonary rehab.    Echocardiogram 07/01/2022  1. Evidence for moderate pulmonary HTN Estimated PAs pressure 62 mmHg.  2. Left ventricular ejection fraction, by estimation, is 60 to 65%. The   left ventricle has normal function. The left ventricle has no regional  wall motion abnormalities. Left ventricular diastolic parameters were  normal.   3. Catheter in RA/RV . Right ventricular systolic function is moderately  reduced. The right ventricular size is normal. There is moderately  elevated pulmonary artery systolic pressure.   4. Left atrial size was mildly dilated.   5. Right atrial size was moderately dilated.   6. The mitral valve is abnormal. No evidence of mitral valve  regurgitation. No evidence of mitral stenosis.   7. Tricuspid valve regurgitation is severe.   8. The aortic valve is tricuspid. Aortic valve regurgitation is trivial.  No aortic stenosis is present.   9. The inferior vena cava is dilated in size with <50% respiratory  variability, suggesting right atrial pressure of 15 mmHg.   Risk Assessment/Calculations:   CHA2DS2-VASc Score = 7  The patient's score is based upon: CHF History: 1 HTN History: 1 Diabetes History: 1 Stroke History: 0 Vascular Disease History: 1 Age Score: 2 Gender Score: 1           Lipid Panel    Component Value Date/Time   CHOL 125 11/13/2020 0955   TRIG 70 11/13/2020 0955   HDL 50 11/13/2020 0955   CHOLHDL 2.5 11/13/2020 0955   VLDL 10 11/24/2010 0837   LDLCALC 60 11/13/2020 0955   BMET    Component Value Date/Time   NA 135 06/21/2024 1209   NA 134 01/12/2024 1401   K 4.6 06/21/2024 1209   CL 97 06/21/2024 1209   CO2 30 06/21/2024 1209   GLUCOSE 121 (H) 06/21/2024 1209   BUN 11 06/21/2024 1209   BUN 15 01/12/2024 1401   CREATININE 0.65 06/21/2024 1209   CREATININE 0.83 04/23/2024 1356   CALCIUM  9.4 06/21/2024 1209   GFRNONAA >60 05/24/2024 0842   GFRNONAA 43 (L) 11/13/2020 0955   GFRAA 50 (L) 11/13/2020 0955   Hemoglobin A1c 6.3% on 09/16/2022 ASSESSMENT:    1. Chronic diastolic CHF (congestive heart failure), NYHA class 2 (HCC)   2. PAH (pulmonary artery hypertension) (HCC)   3. Permanent atrial  fibrillation (HCC)   4. Acquired thrombophilia   5. Second degree AV block   6. SSS (sick sinus syndrome) (HCC)   7. Pacemaker   8. Controlled type 2 diabetes mellitus with complication, without long-term current use of insulin  (HCC)   9. Thyrotoxicosis with diffuse goiter and without thyroid  storm         PLAN:  In order of problems listed above:   CHF: She has both diastolic left heart failure and more significant right heart failure from pulmonary hypertension.  As far as I can tell she is euvolemic.  No edema and clear lung fields.  Poor functional status following her prolonged struggle with hyperthyroidism and hospitalization.  However she is improving.  She was able to walk the long corridor hours in our building without stopping to catch her breath and without support devices.   Intolerant of SGLT2 inhibitors due to frequent yeast infections and muscle cramps.  Her dry weight is no longer clear, after her massive weight loss with thyroid  disease.  In July her proBNP was elevated at 4891, roughly double what it had been at the time of her cardiac catheterization.  Continue with the current dose of furosemide  and spironolactone .  Echocardiogram will not be able to provide information regarding diastolic filling parameters  due to atrial fibrillation. PAH: This is periodically exacerbated when she has left heart failure exacerbation (there is a component of WHO group 2 PAH), but the underlying major component of her pulmonary hypertension is precapillary with an uncertain etiology.  Questionable component of autoimmune disease and possible component of WHO group 3 from untreated obstructive sleep apnea, from when she was heavier.  Chronically anticoagulated, unlikely to have chronic thromboembolic pulmonary hypertension.  Her diastolic blood pressure is quite low and I am reluctant to increase her sildenafil  today.  She is gaining back some weight and getting stronger.  Will wait until her  follow-up appointment with Dr. Bensimhon. AFib: Permanent arrhythmia, usually with slow ventricular rates when we are treating her with beta-blockers.  Beta-blockers have been stopped and she now has roughly 50% ventricular pacing.  On chronic anticoagulation.  She has a remarkably narrow paced QRS complex and actually in the past she felt better with ventricular pacing compared to when we allow native AV conduction.  Once again, we are compelled to keep her off beta-blocker since her blood pressure is low.  CHADSVasc at least 7 (age 89, gender, DM, CAD, HTN, CHF). Anticoagulation: No bleeding problems.  Recent hemoglobin 11.7 in June.  Has a history of GI intolerance to Xarelto. 2nd deg AVB: 97 % ventricular paced rhythm.  She feels best when she has ventricular pacing.  She felt extremely poorly when she lost ventricular capture in the past and she feels unwell when we check underlying rhythm or perform ventricular pacing threshold checks.   SSS: Precedes diagnoses of AV block and persistent atrial fibrillation and was the initial reason for device implantation. PPM: Normal device function.  Although she has a MRI conditional generator, she has an abandoned ventricular lead so the system is not MRI conditional.  Continue remote downloads every 3 months. CAD: Does not have angina pectoris.  Had nonobstructive disease at most recent angiogram in February 2019.  Not on lipid-lowering therapy with most recent LDL cholesterol 60.  With her current thyroid  problems lipid parameters would be probably misleading.  Has not had her lipid profile checked in a while, doing her waxing and waning thyroid  function. DM: Well-controlled even in the setting of thyrotoxicosis.  Most recent hemoglobin A1c was 6.5%. Thyrotoxic goiter: s/p hemithyroidectomy, was euthyroid a year ago, then again developed thyrotoxicosis, finally controlled on treatment with methimazole .  Clinically appears to now be euthyroid, but still very thin  and underweight.   Patient Instructions  Medication Instructions:  No changes *If you need a refill on your cardiac medications before your next appointment, please call your pharmacy*  Lab Work: None ordered If you have labs (blood work) drawn today and your tests are completely normal, you will receive your results only by: MyChart Message (if you have MyChart) OR A paper copy in the mail If you have any lab test that is abnormal or we need to change your treatment, we will call you to review the results.  Testing/Procedures: None ordered  Follow-Up: At [facility], you and your health needs are our priority.  As part of our continuing mission to provide you with exceptional heart care, our providers are all part of one team.  This team includes your primary Cardiologist (physician) and Advanced Practice Providers or APPs (Physician Assistants and Nurse Practitioners) who all work together to provide you with the care you need, when you need it.  Your next appointment:    3-4 months- 09/23/24 at 10:20  Provider:   Jerel Balding, MD    We recommend signing up for the patient portal called MyChart.  Sign up information is provided on this After Visit Summary.  MyChart is used to connect with patients for Virtual Visits (Telemedicine).  Patients are able to view lab/test results, encounter notes, upcoming appointments, etc.  Non-urgent messages can be sent to your provider as well.   To learn more about what you can do with MyChart, go to [email or web address].         Signed, Jerel Balding, MD

## 2024-06-24 ENCOUNTER — Telehealth (HOSPITAL_COMMUNITY): Payer: Self-pay

## 2024-06-24 NOTE — Telephone Encounter (Signed)
-----   Message from Nurse Comer FALCON, RN sent at 06/23/2024 11:21 AM EST ----- Regarding: Need to reschedule appt with Dr Cherrie- push out for one month, please Need to reschedule appt with Dr Cherrie- push out for one month, please. She is currently scheduled for 06/28/24 with Dr Cherrie. She was seen by Dr Francyne today 06/23/24 and agreed that patient could push out appointment one month. Her family does not have any more time this month to be able to bring her, plus expecting bad weather.  Thank you so much

## 2024-06-24 NOTE — Telephone Encounter (Signed)
 Called patient daughter [name] to try and get appt rescheduled with next available no answer left voicemail to callback.

## 2024-06-25 ENCOUNTER — Ambulatory Visit: Admitting: Student in an Organized Health Care Education/Training Program

## 2024-06-25 ENCOUNTER — Encounter: Payer: Self-pay | Admitting: Student in an Organized Health Care Education/Training Program

## 2024-06-25 VITALS — BP 128/61 | HR 85 | Temp 98.3°F | Ht 59.0 in | Wt 97.0 lb

## 2024-06-25 DIAGNOSIS — R5381 Other malaise: Secondary | ICD-10-CM

## 2024-06-25 DIAGNOSIS — G8929 Other chronic pain: Secondary | ICD-10-CM

## 2024-06-25 DIAGNOSIS — K5904 Chronic idiopathic constipation: Secondary | ICD-10-CM | POA: Diagnosis not present

## 2024-06-25 DIAGNOSIS — J90 Pleural effusion, not elsewhere classified: Secondary | ICD-10-CM | POA: Diagnosis not present

## 2024-06-25 DIAGNOSIS — E0590 Thyrotoxicosis, unspecified without thyrotoxic crisis or storm: Secondary | ICD-10-CM

## 2024-06-25 DIAGNOSIS — M545 Low back pain, unspecified: Secondary | ICD-10-CM

## 2024-06-25 DIAGNOSIS — R339 Retention of urine, unspecified: Secondary | ICD-10-CM

## 2024-06-25 DIAGNOSIS — E1140 Type 2 diabetes mellitus with diabetic neuropathy, unspecified: Secondary | ICD-10-CM

## 2024-06-25 NOTE — Assessment & Plan Note (Signed)
 Chronic issue, likely related to a neurogenic bladder component from history of polio.  She is being managed with Dr. Nieves and urology.  Foley catheter was successfully removed last week and she feels like she is emptying her bladder well.  She continues to use Flomax  on a daily basis.

## 2024-06-25 NOTE — Assessment & Plan Note (Signed)
 Chronic issue of constipation which likely was also contributing to urine retention.  She uses an over-the-counter bowel regimen and has good success over the last few weeks and managing this.  Her abdomen is soft and reassuring exam today.

## 2024-06-25 NOTE — Telephone Encounter (Signed)
 Signed and placed in MA basket.  Thank you.

## 2024-06-25 NOTE — Assessment & Plan Note (Signed)
 Chronic and stable.  Managed with endocrinology.  History of a goiter with compressive symptoms and had a right sided hemithyroidectomy, but remains still hyperthyroid.  Using a low-dose of methimazole .  Labs in Care Everywhere showed a normal TSH of 2.2 earlier in January.  No symptoms of hyper or hypothyroidism.  No ocular symptoms.

## 2024-06-25 NOTE — Progress Notes (Signed)
 I called and spoke to pt daughter, [name]. DPR. [name] informed of Beth's note and verbalized understanding. CT ordered. NFN

## 2024-06-25 NOTE — Assessment & Plan Note (Signed)
 Large right pleural effusion treated in December with thoracentesis and pleurodesis.  Recovering well from this.  Repeat x-ray on 1/19 was reassuring, only a small right sided pleural effusion remains.  Etiology of the effusion is likely multifactorial, she has chronic heart disease and lung disease.  I also think she is at risk for chronic aspiration due to kyphosis.  She will continue to be managed with pulmonology.  Will continue to monitor her functionally and see how her dyspnea does as she continues with physical therapy.

## 2024-06-25 NOTE — Progress Notes (Signed)
 "  New Patient Office Visit  Patient ID: Hailey [name], Female   DOB: [birth year] 79 y.o. MRN: [id number]  Chief Complaint  Patient presents with   New Patient (Initial Visit)    Patient is here to establish care and wants you to know what has been going on with hospital and PT.    Subjective:     Hailey [name] presents to establish care  HPI  Discussed the use of AI scribe software for clinical note transcription with the patient, who gave verbal consent to proceed.  History of Present Illness Hailey [name] is a 79 year old female with chronic diastolic heart failure and pulmonary artery hypertension who presents with concerns about her breathing and overall health status. She is accompanied by her husband.  She has a history of chronic diastolic heart failure and pulmonary artery hypertension, and has experienced shortness of breath associated with a large right-sided pleural effusion. This effusion was treated with pleurodesis during a hospitalization from December 10 to May 24, 2024. She was then transferred to subacute rehab until June 10, 2024, and has been home for about two weeks. Her breathing has been a concern for the past six months, and she is worried about not being able to return to her previous level of activity. No use of oxygen at home. She is able to perform some activities of daily living, but receives assistance from caregivers and family at home.  She has a history of atrial fibrillation, for which she underwent an RF ablation procedure that was unsuccessful. She now has a dual chamber permanent pacemaker and is mostly V-paced. She sees Dr. Francyne for cardiology care.  She has a history of hyperthyroidism and was diagnosed with a toxic multinodular goiter, for which she underwent a right lobectomy in 2024. She is currently on methimazole  20 mg daily. She experienced significant weight loss, dropping to 97 pounds, and continues to struggle with maintaining her  weight. Her TSH was in the normal range at 2.2 as of June 08, 2024.  She has a history of urinary retention and recently had a catheter removed on June 07, 2024. Her bladder function has improved since the removal of the catheter.  She has a history of constipation and diarrhea, which she manages with medication as needed. Her bowel movements can be unpredictable, leading to frustration.  She has a history of osteoarthritis, particularly affecting her back, for which she occasionally uses tizanidine  for muscle relaxation. She prefers to avoid pain medications due to their side effects.  She has a history of polio as a child, which required prolonged hospitalization. She also has a history of elevated ANA but no definitive rheumatologic disease diagnosis.  She has a supportive family and caregivers who assist her and her husband at home. She is determined to maintain her independence and improve her quality of life. She reports mood fluctuations but is determined to maintain a positive outlook.   Outpatient Encounter Medications as of 06/25/2024  Medication Sig   ELIQUIS  5 MG TABS tablet TAKE ONE TABLET BY MOUTH TWICE DAILY   fluticasone  (FLONASE ) 50 MCG/ACT nasal spray Place 2 sprays into both nostrils daily as needed for allergies or rhinitis.   furosemide  (LASIX ) 40 MG tablet Take 1 tablet (40 mg total) by mouth daily.   methimazole  (TAPAZOLE ) 10 MG tablet Take 20 mg (two tablets) daily.   pantoprazole  (PROTONIX ) 40 MG tablet TAKE ONE TABLET BY MOUTH TWICE DAILY BEFORE A MEAL   potassium  chloride SA (KLOR-CON  M) 20 MEQ tablet Take 20 mEq by mouth 2 (two) times daily.   sildenafil  (REVATIO ) 20 MG tablet Take 2 tablets (40 mg total) by mouth 3 (three) times daily.   tamsulosin  (FLOMAX ) 0.4 MG CAPS capsule Take 1 capsule (0.4 mg total) by mouth daily.   tiZANidine  (ZANAFLEX ) 2 MG tablet Take 2 mg by mouth at bedtime.   [DISCONTINUED] acetaminophen  (TYLENOL ) 500 MG tablet Take 1,000 mg by  mouth daily as needed for mild pain (pain score 1-3) or moderate pain (pain score 4-6).   [DISCONTINUED] ondansetron  (ZOFRAN ) 4 MG tablet TAKE ONE TABLET BY MOUTH EVERY EIGHT HOURS AS NEEDED FOR NAUSEA OR VOMITING   [DISCONTINUED] sodium chloride  (NASAL MOISTURIZING SPRAY) 0.65 % nasal spray Place 1 spray into the nose as needed for congestion.   [DISCONTINUED] traMADol  (ULTRAM ) 50 MG tablet Take 1 tablet (50 mg total) by mouth daily as needed for moderate pain (pain score 4-6) or severe pain (pain score 7-10).   [DISCONTINUED] albuterol  (VENTOLIN  HFA) 108 (90 Base) MCG/ACT inhaler Inhale 2 puffs into the lungs every 6 (six) hours as needed for wheezing or shortness of breath. (Patient not taking: Reported on 06/23/2024)   No facility-administered encounter medications on file as of 06/25/2024.    Past Medical History:  Diagnosis Date   Acute on chronic diastolic CHF (congestive heart failure), NYHA class 1 (HCC) 04/29/2013   Allergic rhinitis    Brady-tachy syndrome (HCC)    s/p STJ dual chamber PPM by Dr Francyne   CAD (coronary artery disease)    70% RCA stenosis, treated medically   Control of atrial fibrillation with pacemaker (HCC)    Diabetes mellitus (HCC)    type 2   DJD (degenerative joint disease)    Dyspnea    occasional   Hearing loss    Right ear only - no hearing aids   Hypertension    Hyperthyroidism    Multinodular goiter 02/20/2017   Paroxysmal atrial fibrillation (HCC)    Pacemaker St Jude   Pneumonia    years ago- before 2012 per patient   Polio    as a child   PONV (postoperative nausea and vomiting)    Presence of permanent cardiac pacemaker    St Jude pacemaker   Pulmonary disease    Second degree AV block 10/10/2015   Sleep apnea    uses  CPAP nightly    Past Surgical History:  Procedure Laterality Date   arm surgery d/t fx Right 1996   ATRIAL FIBRILLATION ABLATION  07/31/2012; 08-10-13   PVI by Dr Kelsie   ATRIAL FIBRILLATION ABLATION N/A  07/31/2012   Procedure: ATRIAL FIBRILLATION ABLATION;  Surgeon: Lynwood Kelsie, MD;  Location[facility] CATH LAB;  Service: Cardiovascular;  Laterality: N/A;   ATRIAL FIBRILLATION ABLATION N/A 08/10/2013   Procedure: ATRIAL FIBRILLATION ABLATION;  Surgeon: Lynwood JONETTA Kelsie, MD;  Location: [facility] CATH LAB;  Service: Cardiovascular;  Laterality: N/A;   BACK SURGERY  07/26/2012   dR. nUDELMAN   BIOPSY  06/30/2023   Procedure: BIOPSY;  Surgeon: Cindie Carlin POUR, DO;  Location: [facility] ENDO SUITE;  Service: Endoscopy;;   CARDIAC CATHETERIZATION  12/03/2010   single vessel mid RCA   CARDIAC ELECTROPHYSIOLOGY MAPPING AND ABLATION  07/31/2012   Dr. Lynwood Kelsie   CESAREAN SECTION  09/20/1974   Dr. Lonni   CHOLECYSTECTOMY  1994   COLONOSCOPY WITH PROPOFOL  N/A 03/21/2015   Procedure: COLONOSCOPY WITH PROPOFOL ;  Surgeon: Renaye Sous, MD;  Location: [facility]  END[facility];  Service: Endoscopy;  Laterality: N/A;   COLONOSCOPY WITH PROPOFOL  N/A 06/30/2023   Procedure: COLONOSCOPY WITH PROPOFOL ;  Surgeon: Cindie Carlin POUR, DO;  Location: [facility] ENDO [facility];  Service: Endoscopy;  Laterality: N/A;  11:15 AM, ASA 4   DIAGNOSTIC MAMMOGRAM  04/16/2017   Duwaine HERO. Morris, DO   ESOPHAGOGASTRODUODENOSCOPY (EGD) WITH PROPOFOL  N/A 06/30/2023   Procedure: ESOPHAGOGASTRODUODENOSCOPY (EGD) WITH PROPOFOL ;  Surgeon: Cindie Carlin POUR, DO;  Location: [facility] END[facility];  Service: Endoscopy;  Laterality: N/A;  11:15 AM, ASA 4   EYE SURGERY Bilateral    remove cataracts   IR THORACENTESIS RIGHT ASP PLEURAL SPACE W/IMG GUIDE  04/30/2024   IR THORACENTESIS RIGHT ASP PLEURAL SPACE W/IMG GUIDE  05/03/2024   IR THORACENTESIS RIGHT ASP PLEURAL SPACE W/IMG GUIDE  05/13/2024   LAPAROSCOPIC HYSTERECTOMY  Late 70s to early 80s   LEFT HEART CATH AND CORONARY ANGIOGRAPHY N/A 07/30/2017   Procedure: LEFT HEART CATH AND CORONARY ANGIOGRAPHY;  Surgeon: Burnard Debby LABOR, MD;  Location: [facility] INVASIVE CV LAB;  Service: Cardiovascular;  Laterality: N/A;   LUMBAR  LAMINECTOMY/DECOMPRESSION MICRODISCECTOMY Right 12/23/2012   Procedure: Right L5-S1 Laminotomy for resection of synovial cyst;  Surgeon: Lamar LELON Peaches, MD;  Location: [facility] NEURO ORS;  Service: Neurosurgery;  Laterality: Right;  Right Lumbar five-sacral one Laminotomy for resection of synovial cyst   NM MYOCAR PERF WALL MOTION  12/20/2010   normal   PACEMAKER IMPLANT N/A 08/09/2019   Procedure: PACEMAKER IMPLANT;  Surgeon: Francyne Headland, MD;  Location: [facility] INVASIVE CV LAB;  Service: Cardiovascular;  Laterality: N/A;   PACEMAKER INSERTION  12/03/2010   SJM Accent DR RF implanted by Dr Francyne   POLYPECTOMY  06/30/2023   Procedure: POLYPECTOMY;  Surgeon: Cindie Carlin POUR, DO;  Location: [facility] END[facility];  Service: Endoscopy;;   RIGHT HEART CATH N/A 04/22/2018   Procedure: RIGHT HEART CATH;  Surgeon: Darron Deatrice LABOR, MD;  Location: [facility] INVASIVE CV LAB;  Service: Cardiovascular;  Laterality: N/A;   RIGHT HEART CATH N/A 07/09/2022   Procedure: RIGHT HEART CATH;  Surgeon: Cherrie Toribio SAUNDERS, MD;  Location: [facility] INVASIVE CV LAB;  Service: Cardiovascular;  Laterality: N/A;   RIGHT HEART CATH N/A 05/03/2024   Procedure: RIGHT HEART CATH;  Surgeon: Cherrie Toribio SAUNDERS, MD;  Location: [facility] INVASIVE CV LAB;  Service: Cardiovascular;  Laterality: N/A;   TEE WITHOUT CARDIOVERSION N/A 07/30/2012   Procedure: TRANSESOPHAGEAL ECHOCARDIOGRAM (TEE);  Surgeon: Headland Francyne, MD;  Location: [facility] ENDOSCOPY;  Service: Cardiovascular;  Laterality: N/A;  h&p in file-hope   TEE WITHOUT CARDIOVERSION N/A 08/09/2013   Procedure: TRANSESOPHAGEAL ECHOCARDIOGRAM (TEE);  Surgeon: Redell GORMAN Shallow, MD;  Location: [facility] ENDOSCOPY;  Service: Cardiovascular;  Laterality: N/A;   THYROID  LOBECTOMY Right 09/20/2022   Procedure: RIGHT HEMITHYROIDECTOMY;  Surgeon: Llewellyn Gerard LABOR, DO;  Location: [facility] OR;  Service: ENT;  Laterality: Right;   TOTAL ABDOMINAL HYSTERECTOMY  1981   ULTRASOUND GUIDANCE FOR VASCULAR ACCESS  04/22/2018    Procedure: Ultrasound Guidance For Vascular Access;  Surgeon: Darron Deatrice LABOR, MD;  Location: [facility] INVASIVE CV LAB;  Service: Cardiovascular;;    Family History  Problem Relation Age of Onset   Cancer Brother    Heart attack Sister    Breast cancer Sister    Diabetes Sister    Heart disease Sister    Breast cancer Sister    Dementia Sister    Colon cancer Sister    Heart disease Sister    Breast cancer Sister  Suicidality Brother    Prostate cancer Brother    Dementia Brother       Objective:    BP 128/61   Pulse 85   Temp 98.3 F (36.8 C) (Oral)   Ht 4' 11 (1.499 m)   Wt 97 lb (44 kg)   SpO2 99%   BMI 19.59 kg/m   Physical Exam  Gen: Highly frail appearing woman Neck: Normal feeling thyroid  in the left side, absent thyroid  on the right, no nodules or adenopathy Heart: Irregular, 2 out of 6 early systolic murmur at the right upper sternal border Lungs: Unlabored, clear throughout, normal breath sounds bilaterally Abd: Soft and nontender Ext: Warm, no edema, diffuse sarcopenia Neuro: Alert, conversational, slow get up and go, under the table with one-person assistance, full strength upper and lower extremities, mildly unsteady gait      Assessment & Plan:   Problem List Items Addressed This Visit       High   Physical deconditioning - Primary (Chronic)   Progressive issue over the last several months with significant weight loss, sarcopenia, and prolonged hospitalization.  Medical conditions have stabilized.  She has dyspnea from multifactorial source of mix of lung disease and heart disease.  She is independent in her activities of daily living at home, but needs assistance for safety.  I encouraged her to use a rolling walker more for ambulation.  She is using home health physical therapy and is motivated to improve.  We talked about minimizing sedating side effects, discontinue tramadol .  Talked about only intermittent use of tizanidine .        Medium     Hyperthyroidism (Chronic)   Chronic and stable.  Managed with endocrinology.  History of a goiter with compressive symptoms and had a right sided hemithyroidectomy, but remains still hyperthyroid.  Using a low-dose of methimazole .  Labs in Care Everywhere showed a normal TSH of 2.2 earlier in January.  No symptoms of hyper or hypothyroidism.  No ocular symptoms.      Pleural effusion (Chronic)   Large right pleural effusion treated in December with thoracentesis and pleurodesis.  Recovering well from this.  Repeat x-ray on 1/19 was reassuring, only a small right sided pleural effusion remains.  Etiology of the effusion is likely multifactorial, she has chronic heart disease and lung disease.  I also think she is at risk for chronic aspiration due to kyphosis.  She will continue to be managed with pulmonology.  Will continue to monitor her functionally and see how her dyspnea does as she continues with physical therapy.      Type 2 diabetes mellitus with diabetic neuropathy (HCC) (Chronic)   Chronic and stable.  A1c is 6.5% less than a month ago without medication assistance.  She has had dramatic weight loss recently.  Seems to be doing well with nutrition alone.      Chronic low back pain (Chronic)   Intermittent issue of chronic low back pain which historically she has used tizanidine  2 mg at night to treat as needed.  At discharge she was also prescribed tramadol  but has stayed away from this medicine because of worries about worsening constipation.  Reports good benefit with tizanidine , no adverse side effects.  She is frail and at risk for falls so we talked about limiting this medication to only as needed.  Her back has been feeling better the last few weeks at home since she got a hospital bed.        Low  Constipation (Chronic)   Chronic issue of constipation which likely was also contributing to urine retention.  She uses an over-the-counter bowel regimen and has good success over the  last few weeks and managing this.  Her abdomen is soft and reassuring exam today.      Urinary retention (Chronic)   Chronic issue, likely related to a neurogenic bladder component from history of polio.  She is being managed with Dr. Nieves and urology.  Foley catheter was successfully removed last week and she feels like she is emptying her bladder well.  She continues to use Flomax  on a daily basis.       Return in about 4 weeks (around 07/23/2024).   Cleatus Debby Specking, MD [place]  [facility]     "

## 2024-06-25 NOTE — Telephone Encounter (Signed)
"  Placed on providers desk for review.    "

## 2024-06-25 NOTE — Telephone Encounter (Signed)
 Type of form received: [facility]   Additional comments: Orders for signature  Received by: Fax  Form should be Faxed/mailed to: [phone]  Is patient requesting call for pickup: No  Form placed:  In provider pick up folder at front desk   Attach charge sheet.  Provider will determine charge.  Individual made aware of 3-5 business day turn around No?

## 2024-06-25 NOTE — Patient Instructions (Signed)
" °  VISIT SUMMARY: During your visit, we discussed your concerns about breathing and overall health status. We reviewed your recent hospitalization for a right-sided pleural effusion and your ongoing recovery. We also addressed your chronic conditions, including atrial fibrillation, chronic diastolic heart failure, pulmonary arterial hypertension, hyperthyroidism, type 2 diabetes, essential hypertension, osteoarthritis, and chronic diarrhea. We discussed your current treatment plans and made recommendations to help manage your symptoms and improve your quality of life.  YOUR PLAN: -RECOVERY FROM RECENT HOSPITALIZATION FOR RIGHT-SIDED PLEURAL EFFUSION AND FUNCTIONAL DECLINE: You recently had a right-sided pleural effusion treated with pleurodesis, and your recovery is ongoing. We recommend continuing home physical therapy and using a rolling walker for mobility and safety. We will monitor for any signs of pleural effusion recurrence.  -PERMANENT ATRIAL FIBRILLATION WITH PACEMAKER: Atrial fibrillation is an irregular heart rhythm. You are currently managed with a dual chamber pacemaker.  -CHRONIC DIASTOLIC HEART FAILURE AND PULMONARY ARTERIAL HYPERTENSION: Chronic diastolic heart failure means your heart has difficulty relaxing and filling with blood, while pulmonary arterial hypertension is high blood pressure in the arteries of your lungs. We will continue to monitor your condition closely.  -TOXIC MULTINODULAR GOITER, STATUS POST RIGHT LOBECTOMY, ON METHIMAZOLE : A toxic multinodular goiter is an enlarged thyroid  gland with multiple nodules that produce excess thyroid  hormone. You had a right lobectomy in 2024 and are taking methimazole  20 mg daily. Continue this medication as prescribed.  -TYPE 2 DIABETES MELLITUS: Type 2 diabetes is a condition where your body does not use insulin  properly. Your recent A1c is 6.5%, and it is managed with diet and weight control. No medications are needed at this time.  Continue with dietary management and weight control, and we will monitor your A1c every 3-4 months.  -ESSENTIAL HYPERTENSION: Essential hypertension is high blood pressure with no identifiable cause. Your condition is managed with your current medications, and you should continue to monitor your blood pressure at home.  -OSTEOARTHRITIS OF THE SPINE WITH KYPHOSIS: Osteoarthritis is a condition that causes joint pain and stiffness. You have it in your spine, which also has a curvature (kyphosis). Continue using tizanidine  as needed for pain and engage in walking and physical activity.  -CHRONIC DIARRHEA: Chronic diarrhea is frequent, loose, or watery bowel movements. It is managed with stool softeners as needed. Continue using stool softeners as required.  INSTRUCTIONS: Continue with home physical therapy and use a rolling walker for mobility and safety. Monitor for any signs of pleural effusion recurrence. Continue with your current medications and management plans for your chronic conditions. We will monitor your A1c every 3-4 mon ths and your blood pressure at home. Engage in walking and physical activity to help manage osteoarthritis pain.    Contains text generated by [employer].   "

## 2024-06-25 NOTE — Assessment & Plan Note (Signed)
 Intermittent issue of chronic low back pain which historically she has used tizanidine  2 mg at night to treat as needed.  At discharge she was also prescribed tramadol  but has stayed away from this medicine because of worries about worsening constipation.  Reports good benefit with tizanidine , no adverse side effects.  She is frail and at risk for falls so we talked about limiting this medication to only as needed.  Her back has been feeling better the last few weeks at home since she got a hospital bed.

## 2024-06-25 NOTE — Assessment & Plan Note (Signed)
 Progressive issue over the last several months with significant weight loss, sarcopenia, and prolonged hospitalization.  Medical conditions have stabilized.  She has dyspnea from multifactorial source of mix of lung disease and heart disease.  She is independent in her activities of daily living at home, but needs assistance for safety.  I encouraged her to use a rolling walker more for ambulation.  She is using home health physical therapy and is motivated to improve.  We talked about minimizing sedating side effects, discontinue tramadol .  Talked about only intermittent use of tizanidine .

## 2024-06-25 NOTE — Assessment & Plan Note (Signed)
 Chronic and stable.  A1c is 6.5% less than a month ago without medication assistance.  She has had dramatic weight loss recently.  Seems to be doing well with nutrition alone.

## 2024-06-27 ENCOUNTER — Encounter (HOSPITAL_COMMUNITY): Payer: Self-pay | Admitting: *Deleted

## 2024-06-28 ENCOUNTER — Ambulatory Visit (HOSPITAL_COMMUNITY): Admitting: Internal Medicine

## 2024-06-29 NOTE — Telephone Encounter (Signed)
 I do not see anything in MA basket

## 2024-06-30 NOTE — Telephone Encounter (Signed)
 Sorry. This was faxed.

## 2024-07-02 ENCOUNTER — Encounter: Admitting: Student in an Organized Health Care Education/Training Program

## 2024-07-05 ENCOUNTER — Ambulatory Visit: Admitting: Cardiology

## 2024-07-05 NOTE — Progress Notes (Signed)
 LMOVM for pt to return call

## 2024-07-06 ENCOUNTER — Encounter: Payer: Self-pay | Admitting: Cardiovascular Disease

## 2024-07-08 ENCOUNTER — Telehealth: Payer: Self-pay | Admitting: Student in an Organized Health Care Education/Training Program

## 2024-07-08 NOTE — Progress Notes (Signed)
 Pt notified per other encounter

## 2024-07-08 NOTE — Telephone Encounter (Signed)
Placed on PCP desk for review and signature

## 2024-07-08 NOTE — Telephone Encounter (Signed)
 Type of form received: [facility]  Additional comments:   Received by: Fax  Form should be Faxed/mailed to: (address/ fax #) [phone]  Is patient requesting call for pickup:  Form placed:  Provider bin  Attach charge sheet.  Provider will determine charge.  Individual made aware of 3-5 business day turn around Yes?

## 2024-07-09 ENCOUNTER — Encounter: Admitting: Student in an Organized Health Care Education/Training Program

## 2024-07-09 NOTE — Telephone Encounter (Signed)
Faxed and scanned documents.

## 2024-07-09 NOTE — Telephone Encounter (Signed)
 Signed and placed in MA basket.  Thank you.

## 2024-07-23 ENCOUNTER — Ambulatory Visit: Admitting: Student in an Organized Health Care Education/Training Program

## 2024-07-23 ENCOUNTER — Other Ambulatory Visit

## 2024-08-04 ENCOUNTER — Ambulatory Visit

## 2024-09-13 ENCOUNTER — Ambulatory Visit (HOSPITAL_COMMUNITY): Admitting: Internal Medicine

## 2024-09-22 ENCOUNTER — Ambulatory Visit: Admitting: Emergency Medicine

## 2024-09-23 ENCOUNTER — Ambulatory Visit: Admitting: Cardiovascular Disease

## 2024-10-26 ENCOUNTER — Ambulatory Visit: Admitting: Adult Health

## 2024-11-03 ENCOUNTER — Ambulatory Visit

## 2024-12-06 ENCOUNTER — Ambulatory Visit: Admitting: Urology

## 2025-02-02 ENCOUNTER — Ambulatory Visit
